# Patient Record
Sex: Male | Born: 1945
Health system: Southern US, Community
[De-identification: ages and names within clinical notes are randomized; demographics above are authoritative.]

## PROBLEM LIST (undated history)

## (undated) DIAGNOSIS — E119 Type 2 diabetes mellitus without complications: Secondary | ICD-10-CM

## (undated) DIAGNOSIS — R0600 Dyspnea, unspecified: Secondary | ICD-10-CM

## (undated) DIAGNOSIS — R251 Tremor, unspecified: Secondary | ICD-10-CM

## (undated) DIAGNOSIS — Z87898 Personal history of other specified conditions: Secondary | ICD-10-CM

## (undated) DIAGNOSIS — I890 Lymphedema, not elsewhere classified: Secondary | ICD-10-CM

## (undated) DIAGNOSIS — R062 Wheezing: Secondary | ICD-10-CM

## (undated) DIAGNOSIS — I350 Nonrheumatic aortic (valve) stenosis: Secondary | ICD-10-CM

## (undated) DIAGNOSIS — Z720 Tobacco use: Secondary | ICD-10-CM

## (undated) DIAGNOSIS — J449 Chronic obstructive pulmonary disease, unspecified: Secondary | ICD-10-CM

## (undated) DIAGNOSIS — F32A Depression, unspecified: Secondary | ICD-10-CM

## (undated) DIAGNOSIS — I5030 Unspecified diastolic (congestive) heart failure: Secondary | ICD-10-CM

## (undated) DIAGNOSIS — F329 Major depressive disorder, single episode, unspecified: Secondary | ICD-10-CM

## (undated) DIAGNOSIS — R011 Cardiac murmur, unspecified: Secondary | ICD-10-CM

## (undated) DIAGNOSIS — M1990 Unspecified osteoarthritis, unspecified site: Secondary | ICD-10-CM

## (undated) DIAGNOSIS — C61 Malignant neoplasm of prostate: Secondary | ICD-10-CM

## (undated) DIAGNOSIS — G4730 Sleep apnea, unspecified: Secondary | ICD-10-CM

## (undated) DIAGNOSIS — I10 Essential (primary) hypertension: Secondary | ICD-10-CM

## (undated) DIAGNOSIS — R079 Chest pain, unspecified: Secondary | ICD-10-CM

## (undated) HISTORY — DX: Unspecified diastolic (congestive) heart failure: I50.30

## (undated) HISTORY — DX: Major depressive disorder, single episode, unspecified: F32.9

## (undated) HISTORY — DX: Malignant neoplasm of prostate: C61

## (undated) HISTORY — DX: Morbid (severe) obesity due to excess calories: E66.01

## (undated) HISTORY — DX: Tobacco use: Z72.0

## (undated) HISTORY — PX: CARDIAC CATHETERIZATION: SHX172

## (undated) HISTORY — PX: APPENDECTOMY: SHX54

## (undated) HISTORY — PX: OTHER SURGICAL HISTORY: SHX169

## (undated) HISTORY — DX: Nonrheumatic aortic (valve) stenosis: I35.0

## (undated) HISTORY — PX: ADENOIDECTOMY: SUR15

## (undated) HISTORY — DX: Chest pain, unspecified: R07.9

## (undated) HISTORY — PX: TONSILLECTOMY: SUR1361

## (undated) HISTORY — DX: Depression, unspecified: F32.A

## (undated) HISTORY — DX: Sleep apnea, unspecified: G47.30

## (undated) HISTORY — DX: Type 2 diabetes mellitus without complications: E11.9

---

## 2013-03-04 DIAGNOSIS — Z8546 Personal history of malignant neoplasm of prostate: Secondary | ICD-10-CM | POA: Insufficient documentation

## 2013-03-04 DIAGNOSIS — C61 Malignant neoplasm of prostate: Secondary | ICD-10-CM

## 2013-03-04 HISTORY — DX: Malignant neoplasm of prostate: C61

## 2016-03-09 DIAGNOSIS — Z5900 Homelessness unspecified: Secondary | ICD-10-CM

## 2016-03-11 NOTE — Congregational Nurse Program (Unsigned)
Congregational Nurse Program Note  Date of Encounter: 03/09/2016  Past Medical History: No past medical history on file.  Encounter Details:     CNP Questionnaire - 03/09/16 1631    Patient Demographics   Is this a new or existing patient? New   Patient is considered a/an Not Applicable   Race Other   Patient Assistance   Location of Patient Assistance Not Applicable   Patient's financial/insurance status Low Income;Medicare   Uninsured Patient No   Patient referred to apply for the following financial assistance Not Applicable   Food insecurities addressed Provided food supplies   Transportation assistance No   Assistance securing medications No   Educational health offerings Hypertension   Encounter Details   Primary purpose of visit Chronic Illness/Condition Visit   Was an Emergency Department visit averted? Not Applicable   Does patient have a medical provider? Yes   Patient referred to Not Applicable   Was a mental health screening completed? (GAINS tool) No   Does patient have dental issues? No   Does patient have vision issues? No   Does your patient have an abnormal blood pressure today? Yes   Since previous encounter, have you referred patient for abnormal blood pressure that resulted in a new diagnosis or medication change? No   Does your patient have an abnormal blood glucose today? No   Since previous encounter, have you referred patient for abnormal blood glucose that resulted in a new diagnosis or medication change? No   Was there a life-saving intervention made? No      Requested B/P check.  150/90.  States does smoke and just finished smoking a cigarette .  Discussed with him a plan for decreasing smoking

## 2016-03-16 DIAGNOSIS — Z5900 Homelessness unspecified: Secondary | ICD-10-CM

## 2016-03-16 DIAGNOSIS — Z8679 Personal history of other diseases of the circulatory system: Secondary | ICD-10-CM

## 2016-03-16 NOTE — Congregational Nurse Program (Unsigned)
Client presented to [facility] asking if CN would take his blood pressure, client had blood pressure taken a week ago and wanted to get his blood pressure again and it was 160/80 on 03/15/16.  Client did not complain of chest pain or shortness of breath or headache.  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  Client is in [place] from [place].  Client denies feelings of depression, affect is congruent with mood.

## 2016-03-23 DIAGNOSIS — Z8679 Personal history of other diseases of the circulatory system: Secondary | ICD-10-CM

## 2016-03-23 DIAGNOSIS — Z5900 Homelessness unspecified: Secondary | ICD-10-CM

## 2016-03-26 DIAGNOSIS — Z5900 Homelessness unspecified: Secondary | ICD-10-CM

## 2016-03-26 DIAGNOSIS — Z8679 Personal history of other diseases of the circulatory system: Secondary | ICD-10-CM

## 2016-03-30 DIAGNOSIS — Z5900 Homelessness unspecified: Secondary | ICD-10-CM

## 2016-03-30 DIAGNOSIS — Z8679 Personal history of other diseases of the circulatory system: Secondary | ICD-10-CM

## 2016-04-01 DIAGNOSIS — Z8679 Personal history of other diseases of the circulatory system: Secondary | ICD-10-CM

## 2016-04-01 DIAGNOSIS — Z5900 Homelessness unspecified: Secondary | ICD-10-CM

## 2016-04-01 NOTE — Congregational Nurse Program (Unsigned)
Congregational Nurse Program Note  Date of Encounter: 04/01/2016  Past Medical History: No past medical history on file.  Encounter Details:     CNP Questionnaire - 04/01/16 1012    Patient Demographics   Is this a new or existing patient? Existing   Patient is considered a/an Not Applicable   Race Caucasian/White   Patient Assistance   Location of Patient Assistance Not Applicable   Patient's financial/insurance status Low Income;Medicare   Uninsured Patient No   Patient referred to apply for the following financial assistance Not Applicable   Food insecurities addressed Provided food supplies   Transportation assistance No   Assistance securing medications No   Educational health offerings Hypertension;Health literacy;Navigating the healthcare system;Safety;Chronic disease   Encounter Details   Primary purpose of visit Chronic Illness/Condition Visit;Education/Health Concerns   Was an Emergency Department visit averted? Not Applicable   Does patient have a medical provider? No   Patient referred to Clinic   Was a mental health screening completed? (GAINS tool) No   Does patient have dental issues? No   Does patient have vision issues? No   Does your patient have an abnormal blood pressure today? Yes   Since previous encounter, have you referred patient for abnormal blood pressure that resulted in a new diagnosis or medication change? No   Does your patient have an abnormal blood glucose today? No   Since previous encounter, have you referred patient for abnormal blood glucose that resulted in a new diagnosis or medication change? No   Was there a life-saving intervention made? No       B/P check.  Edema of lower legs and feet slightly less from last visit.  Lung sounds - "crackles" all lobes with noticeable rhonchi mid left lobe.  Given his appointment with a HCP is not until July 7, discussed with him when and if he needed to be seen in the ED.  S/S of increased cough,  generalized not feeling well or increased fatigue or change in color of his sputum to yellow

## 2016-04-01 NOTE — Congregational Nurse Program (Unsigned)
Congregational Nurse Program Note  Date of Encounter: 03/23/2016  Past Medical History: No past medical history on file.  Encounter Details:     CNP Questionnaire - 03/23/16 1001    Patient Demographics   Is this a new or existing patient? Existing   Patient is considered a/an Not Applicable   Race Caucasian/White   Patient Assistance   Location of Patient Assistance Not Applicable   Patient's financial/insurance status Low Income;Medicare   Uninsured Patient No   Patient referred to apply for the following financial assistance Not Applicable   Food insecurities addressed Provided food supplies   Transportation assistance No   Assistance securing medications No   Educational health offerings Hypertension;Health literacy;Navigating the healthcare system;Safety;Chronic disease   Encounter Details   Primary purpose of visit Chronic Illness/Condition Visit;Education/Health Concerns   Was an Emergency Department visit averted? Yes   Does patient have a medical provider? No   Patient referred to Clinic   Was a mental health screening completed? (GAINS tool) No   Does patient have dental issues? No   Does patient have vision issues? No   Does your patient have an abnormal blood pressure today? Yes   Since previous encounter, have you referred patient for abnormal blood pressure that resulted in a new diagnosis or medication change? No   Does your patient have an abnormal blood glucose today? No   Since previous encounter, have you referred patient for abnormal blood glucose that resulted in a new diagnosis or medication change? No   Was there a life-saving intervention made? No       Clinic visit for B/P check.  Also stated at night he is having difficulty breathing while laying in bed.  Client has a large girth.  Suggested he add a pillow to elevate his head while sleeping

## 2016-04-01 NOTE — Congregational Nurse Program (Unsigned)
Congregational Nurse Program Note  Date of Encounter: 03/26/2016  Past Medical History: No past medical history on file.  Encounter Details:     CNP Questionnaire - 03/25/16 1005    Patient Demographics   Is this a new or existing patient? Existing   Patient is considered a/an Not Applicable   Race Caucasian/White   Patient Assistance   Location of Patient Assistance Not Applicable   Patient's financial/insurance status Low Income;Medicare   Uninsured Patient No   Patient referred to apply for the following financial assistance Not Applicable   Food insecurities addressed Provided food supplies   Transportation assistance No   Assistance securing medications No   Educational health offerings Hypertension;Health literacy;Navigating the healthcare system;Safety;Chronic disease   Encounter Details   Primary purpose of visit Chronic Illness/Condition Visit;Education/Health Concerns   Was an Emergency Department visit averted? Yes   Does patient have a medical provider? No   Patient referred to Clinic   Was a mental health screening completed? (GAINS tool) No   Does patient have dental issues? No   Does patient have vision issues? No   Does your patient have an abnormal blood pressure today? Yes   Since previous encounter, have you referred patient for abnormal blood pressure that resulted in a new diagnosis or medication change? No   Does your patient have an abnormal blood glucose today? No   Since previous encounter, have you referred patient for abnormal blood glucose that resulted in a new diagnosis or medication change? No   Was there a life-saving intervention made? No       B/P check.  States he was able to obtain a second pillow which helped his breathing at night.  Does have an appointment with [facility] on July 7.  Encouraged him to return to clinic to have B/P monitored

## 2016-04-01 NOTE — Congregational Nurse Program (Unsigned)
Congregational Nurse Program Note  Date of Encounter: 03/30/2016  Past Medical History: No past medical history on file.  Encounter Details:     CNP Questionnaire - 03/30/16 1008    Patient Demographics   Is this a new or existing patient? Existing   Patient is considered a/an Not Applicable   Race Caucasian/White   Patient Assistance   Location of Patient Assistance Not Applicable   Patient's financial/insurance status Low Income;Medicare   Uninsured Patient No   Patient referred to apply for the following financial assistance Not Applicable   Food insecurities addressed Provided food supplies   Transportation assistance No   Assistance securing medications No   Educational health offerings Hypertension;Health literacy;Navigating the healthcare system;Safety;Chronic disease   Encounter Details   Primary purpose of visit Chronic Illness/Condition Visit;Education/Health Concerns   Was an Emergency Department visit averted? Yes   Does patient have a medical provider? No   Patient referred to Clinic   Was a mental health screening completed? (GAINS tool) No   Does patient have dental issues? No   Does patient have vision issues? No   Does your patient have an abnormal blood pressure today? Yes   Since previous encounter, have you referred patient for abnormal blood pressure that resulted in a new diagnosis or medication change? No   Does your patient have an abnormal blood glucose today? No   Since previous encounter, have you referred patient for abnormal blood glucose that resulted in a new diagnosis or medication change? No   Was there a life-saving intervention made? No       B/P check.  States is coughing up mucous, clear to white in color.  Non-pitting edema noted both feet.  Encouraged elevation of feet as much as possible.  States by history has COPD.  Is a smoker.  Discussed with him the need to begin stopping smoking

## 2016-04-13 DIAGNOSIS — Z5900 Homelessness unspecified: Secondary | ICD-10-CM

## 2016-04-13 DIAGNOSIS — Z8679 Personal history of other diseases of the circulatory system: Secondary | ICD-10-CM

## 2016-04-15 DIAGNOSIS — Z8679 Personal history of other diseases of the circulatory system: Secondary | ICD-10-CM

## 2016-04-22 DIAGNOSIS — Z8679 Personal history of other diseases of the circulatory system: Secondary | ICD-10-CM

## 2016-04-25 NOTE — Congregational Nurse Program (Unsigned)
Congregational Nurse Program Note  Date of Encounter: 04/13/2016  Past Medical History: No past medical history on file.  Encounter Details:     CNP Questionnaire - 04/14/16 1350      Patient Assistance   Uninsured Patient Yes   Food insecurities addressed Referred to food bank or resource   Transportation assistance No     Encounter Details   Was an Emergency Department visit averted? Yes   Does patient have a medical provider? Yes   Patient referred to Clinic   Does patient have dental issues? No   Does patient have vision issues? Yes   Was a vision referral made? Yes   Does your patient have an abnormal blood pressure today? No   Since previous encounter, have you referred patient for abnormal blood pressure that resulted in a new diagnosis or medication change? No   Does your patient have an abnormal blood glucose today? No   Since previous encounter, have you referred patient for abnormal blood glucose that resulted in a new diagnosis or medication change? No   Was there a life-saving intervention made? No      B/p Check.  Was seen at [facility].  Losartan increased to 50 mg daily.

## 2016-04-25 NOTE — Congregational Nurse Program (Unsigned)
Congregational Nurse Program Note  Date of Encounter: 04/15/2016  Past Medical History: No past medical history on file.  Encounter Details:     CNP Questionnaire - 04/15/16 1404      Patient Demographics   Is this a new or existing patient? Existing   Patient is considered a/an Not Applicable   Race Caucasian/White     Patient Assistance   Location of Patient Assistance Not Applicable   Patient's financial/insurance status Low Income;Medicare   Uninsured Patient No   Interventions Not Applicable   Patient referred to apply for the following financial assistance Not Applicable   Food insecurities addressed Referred to food bank or resource   Transportation assistance No   Assistance securing medications No   Educational health offerings Hypertension;Health literacy;Navigating the healthcare system;Safety;Chronic disease     Encounter Details   Primary purpose of visit Chronic Illness/Condition Visit;Education/Health Concerns   Was an Emergency Department visit averted? Yes   Does patient have a medical provider? Yes   Patient referred to Clinic   Was a mental health screening completed? (GAINS tool) No   Does patient have dental issues? No   Does patient have vision issues? Yes   Was a vision referral made? No   Does your patient have an abnormal blood pressure today? No   Since previous encounter, have you referred patient for abnormal blood pressure that resulted in a new diagnosis or medication change? No   Does your patient have an abnormal blood glucose today? No   Since previous encounter, have you referred patient for abnormal blood glucose that resulted in a new diagnosis or medication change? No   Was there a life-saving intervention made? No      B/p check 140/90.  Client had just come in from being outside.  Encouraged him to sit quietly and allow self to cool down.  Drinking water.

## 2016-04-25 NOTE — Congregational Nurse Program (Unsigned)
Congregational Nurse Program Note  Date of Encounter: 04/22/2016  Past Medical History: No past medical history on file.  Encounter Details:     CNP Questionnaire - 04/22/16 1407      Patient Demographics   Is this a new or existing patient? Existing   Patient is considered a/an Not Applicable   Race Caucasian/White     Patient Assistance   Location of Patient Assistance Not Applicable   Patient's financial/insurance status Low Income;Medicare   Uninsured Patient No   Interventions Not Applicable   Patient referred to apply for the following financial assistance Not Applicable   Food insecurities addressed Referred to food bank or resource   Transportation assistance No   Assistance securing medications No   Educational health offerings Hypertension;Health literacy;Navigating the healthcare system;Safety;Chronic disease     Encounter Details   Primary purpose of visit Chronic Illness/Condition Visit;Education/Health Concerns   Was an Emergency Department visit averted? Yes   Does patient have a medical provider? Yes   Patient referred to Clinic   Was a mental health screening completed? (GAINS tool) No   Does patient have dental issues? No   Does patient have vision issues? Yes   Was a vision referral made? No   Does your patient have an abnormal blood pressure today? No   Since previous encounter, have you referred patient for abnormal blood pressure that resulted in a new diagnosis or medication change? No   Does your patient have an abnormal blood glucose today? No   Since previous encounter, have you referred patient for abnormal blood glucose that resulted in a new diagnosis or medication change? No   Was there a life-saving intervention made? No      B/P check  120/70.  Reports will be going to live with his daughter in the [place] area the first of August.

## 2016-05-23 ENCOUNTER — Emergency Department: Payer: Medicare Other

## 2016-05-23 ENCOUNTER — Emergency Department
Admission: EM | Admit: 2016-05-23 | Discharge: 2016-05-23 | Disposition: A | Discharge status: Home / Self Care | Payer: Medicare Other | Attending: Emergency Medicine | Admitting: Emergency Medicine

## 2016-05-23 ENCOUNTER — Encounter: Payer: Self-pay | Admitting: Emergency Medicine

## 2016-05-23 DIAGNOSIS — J449 Chronic obstructive pulmonary disease, unspecified: Secondary | ICD-10-CM | POA: Diagnosis not present

## 2016-05-23 DIAGNOSIS — L089 Local infection of the skin and subcutaneous tissue, unspecified: Secondary | ICD-10-CM

## 2016-05-23 DIAGNOSIS — Z7951 Long term (current) use of inhaled steroids: Secondary | ICD-10-CM | POA: Insufficient documentation

## 2016-05-23 DIAGNOSIS — T814XXA Infection following a procedure, initial encounter: Secondary | ICD-10-CM | POA: Insufficient documentation

## 2016-05-23 DIAGNOSIS — Z7982 Long term (current) use of aspirin: Secondary | ICD-10-CM | POA: Insufficient documentation

## 2016-05-23 DIAGNOSIS — T148XXA Other injury of unspecified body region, initial encounter: Secondary | ICD-10-CM

## 2016-05-23 DIAGNOSIS — Z87891 Personal history of nicotine dependence: Secondary | ICD-10-CM | POA: Diagnosis not present

## 2016-05-23 DIAGNOSIS — I10 Essential (primary) hypertension: Secondary | ICD-10-CM | POA: Insufficient documentation

## 2016-05-23 DIAGNOSIS — Y829 Unspecified medical devices associated with adverse incidents: Secondary | ICD-10-CM | POA: Insufficient documentation

## 2016-05-23 HISTORY — DX: Essential (primary) hypertension: I10

## 2016-05-23 HISTORY — DX: Chronic obstructive pulmonary disease, unspecified: J44.9

## 2016-05-23 LAB — CBC WITH DIFFERENTIAL/PLATELET
BASOS ABS: 0.1 10*3/uL (ref 0–0.1)
BASOS PCT: 1 %
EOS ABS: 0.2 10*3/uL (ref 0–0.7)
EOS PCT: 2 %
HCT: 46.9 % (ref 40.0–52.0)
Hemoglobin: 16.4 g/dL (ref 13.0–18.0)
Lymphocytes Relative: 18 %
Lymphs Abs: 1.5 10*3/uL (ref 1.0–3.6)
MCH: 33.3 pg (ref 26.0–34.0)
MCHC: 35.1 g/dL (ref 32.0–36.0)
MCV: 94.9 fL (ref 80.0–100.0)
MONO ABS: 0.8 10*3/uL (ref 0.2–1.0)
Monocytes Relative: 10 %
Neutro Abs: 5.9 10*3/uL (ref 1.4–6.5)
Neutrophils Relative %: 69 %
PLATELETS: 210 10*3/uL (ref 150–440)
RBC: 4.94 MIL/uL (ref 4.40–5.90)
RDW: 13.5 % (ref 11.5–14.5)
WBC: 8.4 10*3/uL (ref 3.8–10.6)

## 2016-05-23 LAB — COMPREHENSIVE METABOLIC PANEL
ALBUMIN: 4.1 g/dL (ref 3.5–5.0)
ALT: 33 U/L (ref 17–63)
AST: 29 U/L (ref 15–41)
Alkaline Phosphatase: 81 U/L (ref 38–126)
Anion gap: 7 (ref 5–15)
BUN: 23 mg/dL — AB (ref 6–20)
CHLORIDE: 104 mmol/L (ref 101–111)
CO2: 27 mmol/L (ref 22–32)
Calcium: 9.5 mg/dL (ref 8.9–10.3)
Creatinine, Ser: 1.02 mg/dL (ref 0.61–1.24)
GFR calc Af Amer: >60 mL/min (ref >60)
GLUCOSE: 103 mg/dL — AB (ref 65–99)
POTASSIUM: 4 mmol/L (ref 3.5–5.1)
Sodium: 138 mmol/L (ref 135–145)
Total Bilirubin: 0.9 mg/dL (ref 0.3–1.2)
Total Protein: 7.2 g/dL (ref 6.5–8.1)

## 2016-05-23 MED ORDER — CEFAZOLIN IN D5W 1 GM/50ML IV SOLN
INTRAVENOUS | Status: AC
  Filled 2016-05-23: qty 50

## 2016-05-23 MED ORDER — CEPHALEXIN 500 MG PO CAPS: 500.0000 mg | ORAL_CAPSULE | Freq: Two times a day (BID) | ORAL | 0 refills | Status: DC

## 2016-05-23 MED ORDER — SULFAMETHOXAZOLE-TRIMETHOPRIM 800-160 MG PO TABS: 1.0000 | ORAL_TABLET | Freq: Two times a day (BID) | ORAL | 0 refills | Status: DC

## 2016-05-23 MED ORDER — CEFAZOLIN IN D5W 1 GM/50ML IV SOLN
1.0000 g | Freq: Once | INTRAVENOUS | Status: AC
  Administered 2016-05-23: 1 g via INTRAVENOUS

## 2016-05-23 NOTE — ED Provider Notes (Signed)
[facility] Emergency Department Provider Note   ____________________________________________    I have reviewed the triage vital signs and the nursing notes.   HISTORY  Chief Complaint Laceration     HPI Nathaniel Little is a 70 y.o. male who presents with complaints of possible wound infection. Patient reports he stepped on a piece of plywood one week ago and his leg went through it and he suffered a laceration to his anterior tibia area of the left leg. He anticipated that it would heal well but recently he has developed redness around the wound and increased pain. He denies fevers or chills. He denies discharge. He denies a history of diabetes.   Past Medical History:  Diagnosis Date  . COPD (chronic obstructive pulmonary disease) ([place])   . Hypertension     There are no active problems to display for this patient.   History reviewed. No pertinent surgical history.  Prior to Admission medications   Medication Sig Start Date End Date Taking? Authorizing Provider  albuterol (PROVENTIL) (2.5 MG/3ML) 0.083% nebulizer solution Take 2.5 mg by nebulization every 6 (six) hours as needed for wheezing or shortness of breath.    Historical Provider, MD  aspirin 81 MG tablet Take 81 mg by mouth daily.    Historical Provider, MD  cephALEXin (KEFLEX) 500 MG capsule Take 1 capsule (500 mg total) by mouth 2 (two) times daily. 05/23/16   Lavonia Drafts, MD  escitalopram (LEXAPRO) 10 MG tablet Take 10 mg by mouth daily. Reported on 04/14/2016    Historical Provider, MD  losartan (COZAAR) 50 MG tablet Take 50 mg by mouth daily.    Historical Provider, MD  sulfamethoxazole-trimethoprim (BACTRIM DS,SEPTRA DS) 800-160 MG tablet Take 1 tablet by mouth 2 (two) times daily. 05/23/16   Lavonia Drafts, MD     Allergies Codeine  History reviewed. No pertinent family history.  Social History Social History  Substance Use Topics  . Smoking status: Former Smoker    Years:  68.00    Types: Cigarettes  . Smokeless tobacco: Former Systems developer    Quit date: 05/16/2016  . Alcohol use Not on file    Review of Systems  Constitutional: No fever/chills   Cardiovascular: Denies chest pain. Respiratory: Denies shortness of breath. Gastrointestinal: No abdominal pain. .   Genitourinary: Negative for dysuria. Musculoskeletal: Leg pain as above Skin: As above Neurological: Negative for weakness  10-point ROS otherwise negative.  ____________________________________________   PHYSICAL EXAM:  VITAL SIGNS: ED Triage Vitals  Enc Vitals Group     BP 05/23/16 1703 140/78     Pulse Rate 05/23/16 1703 80     Resp 05/23/16 1703 16     Temp 05/23/16 1703 98.3 F (36.8 C)     Temp Source 05/23/16 1703 Oral     SpO2 05/23/16 1703 94 %     Weight 05/23/16 1703 225 lb (102.1 kg)     Height 05/23/16 1703 '5\' 8"'$  (1.727 m)     Head Circumference --      Peak Flow --      Pain Score 05/23/16 1706 5     Pain Loc --      Pain Edu? --      Excl. in [place]? --     Constitutional: Alert and oriented. No acute distress. Pleasant and interactive Eyes: Conjunctivae are normal.  Head: Atraumatic. Nose: No congestion/rhinnorhea.  Neck:  Painless ROM Cardiovascular: Normal rate, regular rhythm. Grossly normal heart sounds.  Good peripheral  circulation. Respiratory: Normal respiratory effort.  No retractions.    Musculoskeletal: Left lower leg: Approximately 8 cm vertical laceration that is foul smelling but without discharge or fluctuance. Mild erythema extending away from the wound. Neurologic:  Normal speech and language. No gross focal neurologic deficits are appreciated.  Skin:  Skin is warm, dry. As above Psychiatric: Mood and affect are normal. Speech and behavior are normal.  ____________________________________________   LABS (all labs ordered are listed, but only abnormal results are displayed)  Labs Reviewed  COMPREHENSIVE METABOLIC PANEL - Abnormal; Notable for  the following:       Result Value   Glucose, Bld 103 (*)    BUN 23 (*)    All other components within normal limits  CBC WITH DIFFERENTIAL/PLATELET   ____________________________________________  EKG  None ____________________________________________  RADIOLOGY  X-rays unremarkable ____________________________________________   PROCEDURES  Procedure(s) performed: No    Critical Care performed: No ____________________________________________   INITIAL IMPRESSION / ASSESSMENT AND PLAN / ED COURSE  Pertinent labs & imaging results that were available during my care of the patient were reviewed by me and considered in my medical decision making (see chart for details).  Patient presents with likely wound infection. We will check labs, check x-ray, give a dose of IV antibiotics and determine whether the patient requires inpatient or outpatient management. Tetanus is up-to-date  Clinical Course  Value Comment By Time  CO2: 27 (Reviewed) Lavonia Drafts, MD 08/20 1900  Discussed with patient he would prefer to try outpatient Rx, given that labs are unremarkable patient is afebrile and well-appearing I feel that this is reasonable. We will give a dose of IV antibiotics in the emergency department and the patient will return in 2 days for a wound recheck, he knows to return sooner if any worsening ____________________________________________   FINAL CLINICAL IMPRESSION(S) / ED DIAGNOSES  Final diagnoses:  Wound infection ([place])      NEW MEDICATIONS STARTED DURING THIS VISIT:  Discharge Medication List as of 05/23/2016  7:51 PM    START taking these medications   Details  cephALEXin (KEFLEX) 500 MG capsule Take 1 capsule (500 mg total) by mouth 2 (two) times daily., Starting Sun 05/23/2016, Print    sulfamethoxazole-trimethoprim (BACTRIM DS,SEPTRA DS) 800-160 MG tablet Take 1 tablet by mouth 2 (two) times daily., Starting Sun 05/23/2016, Print         Note:  This  document was prepared using Dragon voice recognition software and may include unintentional dictation errors.    Lavonia Drafts, MD 05/23/16 2150

## 2016-05-23 NOTE — ED Triage Notes (Signed)
Pt states his left leg went through some plywood - has been treating it but redness and swelling have started.

## 2016-05-26 ENCOUNTER — Emergency Department
Admission: EM | Admit: 2016-05-26 | Discharge: 2016-05-26 | Disposition: A | Discharge status: Home / Self Care | Payer: Medicare Other | Attending: Emergency Medicine | Admitting: Emergency Medicine

## 2016-05-26 ENCOUNTER — Encounter: Payer: Self-pay | Admitting: Medical Oncology

## 2016-05-26 DIAGNOSIS — T148XXA Other injury of unspecified body region, initial encounter: Secondary | ICD-10-CM

## 2016-05-26 DIAGNOSIS — J449 Chronic obstructive pulmonary disease, unspecified: Secondary | ICD-10-CM | POA: Diagnosis not present

## 2016-05-26 DIAGNOSIS — Z87891 Personal history of nicotine dependence: Secondary | ICD-10-CM | POA: Insufficient documentation

## 2016-05-26 DIAGNOSIS — L089 Local infection of the skin and subcutaneous tissue, unspecified: Secondary | ICD-10-CM | POA: Diagnosis not present

## 2016-05-26 DIAGNOSIS — Z7982 Long term (current) use of aspirin: Secondary | ICD-10-CM | POA: Insufficient documentation

## 2016-05-26 DIAGNOSIS — I10 Essential (primary) hypertension: Secondary | ICD-10-CM | POA: Insufficient documentation

## 2016-05-26 DIAGNOSIS — Z5189 Encounter for other specified aftercare: Secondary | ICD-10-CM

## 2016-05-26 DIAGNOSIS — Z4800 Encounter for change or removal of nonsurgical wound dressing: Secondary | ICD-10-CM | POA: Diagnosis present

## 2016-05-26 LAB — CBC WITH DIFFERENTIAL/PLATELET
Basophils Absolute: 0.1 10*3/uL (ref 0–0.1)
Basophils Relative: 1 %
Eosinophils Absolute: 0.1 10*3/uL (ref 0–0.7)
Eosinophils Relative: 2 %
HEMATOCRIT: 47.3 % (ref 40.0–52.0)
HEMOGLOBIN: 16.6 g/dL (ref 13.0–18.0)
LYMPHS ABS: 1.6 10*3/uL (ref 1.0–3.6)
LYMPHS PCT: 18 %
MCH: 32.9 pg (ref 26.0–34.0)
MCHC: 35 g/dL (ref 32.0–36.0)
MCV: 93.9 fL (ref 80.0–100.0)
MONOS PCT: 10 %
Monocytes Absolute: 0.9 10*3/uL (ref 0.2–1.0)
NEUTROS ABS: 6.2 10*3/uL (ref 1.4–6.5)
NEUTROS PCT: 69 %
Platelets: 209 10*3/uL (ref 150–440)
RBC: 5.04 MIL/uL (ref 4.40–5.90)
RDW: 13 % (ref 11.5–14.5)
WBC: 9 10*3/uL (ref 3.8–10.6)

## 2016-05-26 LAB — BASIC METABOLIC PANEL
ANION GAP: 9 (ref 5–15)
BUN: 19 mg/dL (ref 6–20)
CHLORIDE: 102 mmol/L (ref 101–111)
CO2: 23 mmol/L (ref 22–32)
Calcium: 9.5 mg/dL (ref 8.9–10.3)
Creatinine, Ser: 1.02 mg/dL (ref 0.61–1.24)
GFR calc non Af Amer: >60 mL/min (ref >60)
Glucose, Bld: 89 mg/dL (ref 65–99)
Potassium: 4.1 mmol/L (ref 3.5–5.1)
Sodium: 134 mmol/L — ABNORMAL LOW (ref 135–145)

## 2016-05-26 NOTE — ED Triage Notes (Signed)
Pt was seen here 2 days ago, here for re-check.

## 2016-05-26 NOTE — Discharge Instructions (Signed)
Continue previous medication and discharge instructions. Advised to call the wound clinic in the morning to schedule appointment.

## 2016-05-26 NOTE — ED Provider Notes (Signed)
[facility] Emergency Department Provider Note   ____________________________________________   None    (approximate)  I have reviewed the triage vital signs and the nursing notes.   HISTORY  Chief Complaint Wound Check    HPI Nathaniel Little is a 70 y.o. male patient has a day to day follow-up wound check. Patient was seen 2 days ago after having a week old laceration left lower leg. Patient state he stepped on a piece of plywood his leg went through and he suffered lacerations to anterior left leg. Patient has significant medical care for 1 week. On patient visit 2 days ago he was given IV antibiotics and discharged with Bactrim and Keflex. Patient states redness has spread it from the initial wound site and now the wound has a foul smell to it. Initially the patient was offered to be admitted but wanted to try outpatient therapy. Patient states he believed the wound is has worsened and requests admission.Patient rates his pain as 8/10.   Past Medical History:  Diagnosis Date  . COPD (chronic obstructive pulmonary disease) ([place])   . Hypertension     There are no active problems to display for this patient.   History reviewed. No pertinent surgical history.  Prior to Admission medications   Medication Sig Start Date End Date Taking? Authorizing Provider  albuterol (PROVENTIL) (2.5 MG/3ML) 0.083% nebulizer solution Take 2.5 mg by nebulization every 6 (six) hours as needed for wheezing or shortness of breath.    Historical Provider, MD  aspirin 81 MG tablet Take 81 mg by mouth daily.    Historical Provider, MD  cephALEXin (KEFLEX) 500 MG capsule Take 1 capsule (500 mg total) by mouth 2 (two) times daily. 05/23/16   Lavonia Drafts, MD  escitalopram (LEXAPRO) 10 MG tablet Take 10 mg by mouth daily. Reported on 04/14/2016    Historical Provider, MD  losartan (COZAAR) 50 MG tablet Take 50 mg by mouth daily.    Historical Provider, MD    sulfamethoxazole-trimethoprim (BACTRIM DS,SEPTRA DS) 800-160 MG tablet Take 1 tablet by mouth 2 (two) times daily. 05/23/16   Lavonia Drafts, MD    Allergies Codeine  No family history on file.  Social History Social History  Substance Use Topics  . Smoking status: Former Smoker    Years: 68.00    Types: Cigarettes  . Smokeless tobacco: Former Systems developer    Quit date: 05/16/2016  . Alcohol use Not on file    Review of Systems Constitutional: No fever/chills Eyes: No visual changes. ENT: No sore throat. Cardiovascular: Denies chest pain. Respiratory: Denies shortness of breath. Gastrointestinal: No abdominal pain.  No nausea, no vomiting.  No diarrhea.  No constipation. Genitourinary: Negative for dysuria. Musculoskeletal: Negative for back pain. Skin: Negative for rash. Nonhealing laceration left leg. Neurological: Negative for headaches, focal weakness or numbness. Endocrine:Hypertension ____________________________________________   PHYSICAL EXAM:  VITAL SIGNS: ED Triage Vitals  Enc Vitals Group     BP 05/26/16 1717 (!) 163/85     Pulse Rate 05/26/16 1717 77     Resp 05/26/16 1717 20     Temp 05/26/16 1717 97.6 F (36.4 C)     Temp Source 05/26/16 1717 Oral     SpO2 05/26/16 1717 95 %     Weight 05/26/16 1717 225 lb (102.1 kg)     Height 05/26/16 1717 '5\' 8"'$  (1.727 m)     Head Circumference --      Peak Flow --  Pain Score 05/26/16 1719 8     Pain Loc --      Pain Edu? --      Excl. in [place]? --     Constitutional: Alert and oriented. Well appearing and in no acute distress. Eyes: Conjunctivae are normal. PERRL. EOMI. Head: Atraumatic. Nose: No congestion/rhinnorhea. Mouth/Throat: Mucous membranes are moist.  Oropharynx non-erythematous. Neck: No stridor.  No cervical spine tenderness to palpation.. Hematological/Lymphatic/Immunilogical: No cervical lymphadenopathy. Cardiovascular: Normal rate, regular rhythm. Grossly normal heart sounds.  Good peripheral  circulation. Elevated blood pressure Respiratory: Normal respiratory effort.  No retractions. Lungs CTAB. Gastrointestinal: Soft and nontender. No distention. No abdominal bruits. No CVA tenderness. Musculoskeletal: No lower extremity tenderness nor edema.  No joint effusions. Neurologic:  Normal speech and language. No gross focal neurologic deficits are appreciated. No gait instability. Skin:  Patient has a 7-8 cm vertical laceration to the anterior lower left leg. There is obvious foul smell upon entry into room. Patient has erythema extending from the wound site. Unable to chart progress erythematous from previous visit. Psychiatric: Mood and affect are normal. Speech and behavior are normal.  ____________________________________________   LABS (all labs ordered are listed, but only abnormal results are displayed)  Labs Reviewed  BASIC METABOLIC PANEL - Abnormal; Notable for the following:       Result Value   Sodium 134 (*)    All other components within normal limits  CBC WITH DIFFERENTIAL/PLATELET   ____________________________________________  EKG   ____________________________________________  RADIOLOGY   ____________________________________________   PROCEDURES  Procedure(s) performed: None  Procedures  Critical Care performed: No  ____________________________________________   INITIAL IMPRESSION / ASSESSMENT AND PLAN / ED COURSE  Pertinent labs & imaging results that were available during my care of the patient were reviewed by me and considered in my medical decision making (see chart for details).  Wound infection left lower leg. Discussed last with patient. Advised to continue previous antibiotics and follow-up with the wound clinic by calling for an appointment tomorrow morning.  Clinical Course   Patient complaining is not responding to outpatient antibiotics. Will discuss with Doctor  Karma Greaser.  ____________________________________________   FINAL CLINICAL IMPRESSION(S) / ED DIAGNOSES  Final diagnoses:  Wound infection ([place])  Visit for wound check      NEW MEDICATIONS STARTED DURING THIS VISIT:  New Prescriptions   No medications on file     Note:  This document was prepared using Dragon voice recognition software and may include unintentional dictation errors.    Sable Feil, PA-C 05/26/16 [place], PA-C 05/26/16 1912    Hinda Kehr, MD 05/26/16 1944

## 2016-05-31 ENCOUNTER — Encounter: Payer: Medicare Other | Attending: Surgery | Admitting: Surgery

## 2016-05-31 DIAGNOSIS — Z79899 Other long term (current) drug therapy: Secondary | ICD-10-CM | POA: Diagnosis not present

## 2016-05-31 DIAGNOSIS — I10 Essential (primary) hypertension: Secondary | ICD-10-CM | POA: Diagnosis not present

## 2016-05-31 DIAGNOSIS — Z8546 Personal history of malignant neoplasm of prostate: Secondary | ICD-10-CM | POA: Diagnosis not present

## 2016-05-31 DIAGNOSIS — X58XXXA Exposure to other specified factors, initial encounter: Secondary | ICD-10-CM | POA: Insufficient documentation

## 2016-05-31 DIAGNOSIS — L97222 Non-pressure chronic ulcer of left calf with fat layer exposed: Secondary | ICD-10-CM | POA: Insufficient documentation

## 2016-05-31 DIAGNOSIS — F329 Major depressive disorder, single episode, unspecified: Secondary | ICD-10-CM | POA: Diagnosis not present

## 2016-05-31 DIAGNOSIS — M1611 Unilateral primary osteoarthritis, right hip: Secondary | ICD-10-CM | POA: Diagnosis not present

## 2016-05-31 DIAGNOSIS — J449 Chronic obstructive pulmonary disease, unspecified: Secondary | ICD-10-CM | POA: Insufficient documentation

## 2016-05-31 DIAGNOSIS — Z87891 Personal history of nicotine dependence: Secondary | ICD-10-CM | POA: Insufficient documentation

## 2016-05-31 DIAGNOSIS — Z7982 Long term (current) use of aspirin: Secondary | ICD-10-CM | POA: Diagnosis not present

## 2016-05-31 DIAGNOSIS — I890 Lymphedema, not elsewhere classified: Secondary | ICD-10-CM | POA: Diagnosis not present

## 2016-05-31 DIAGNOSIS — S81812A Laceration without foreign body, left lower leg, initial encounter: Secondary | ICD-10-CM | POA: Diagnosis present

## 2016-05-31 NOTE — Progress Notes (Addendum)
RONDEL, EPISCOPO ([id number]) Visit Report for 05/31/2016 Chief Complaint Document Details Patient Name: Nathaniel Little, Nathaniel Little Date of Service: 05/31/2016 8:45 AM Medical Record Number: [id number] Patient Account Number: [id number] Date of Birth/Sex: 1946-08-09 (70 y.o. Male) Treating RN: Cornell Barman Primary Care Physician: PATIENT, NO Other Clinician: Referring Physician: Hinda Kehr Treating Physician/Extender: Frann Rider in Treatment: 0 Information Obtained from: Patient Chief Complaint Patient seen for complaints of Non-Healing Wound for his left lower extremity for 2 weeks Electronic Signature(s) Signed: 05/31/2016 9:24:39 AM By: Christin Fudge MD, FACS Entered By: Christin Fudge on 05/31/2016 09:24:39 [name] ([id number]) -------------------------------------------------------------------------------- Debridement Details Patient Name: [name] Date of Service: 05/31/2016 8:45 AM Medical Record Number: [id number] Patient Account Number: [id number] Date of Birth/Sex: [birth year] (69 y.o. Male) Treating RN: Cornell Barman Primary Care Physician: PATIENT, NO Other Clinician: Referring Physician: Hinda Kehr Treating Physician/Extender: Frann Rider in Treatment: 0 Debridement Performed for Wound #1 Left Lower Leg Assessment: Performed By: Physician Christin Fudge, MD Debridement: Debridement Pre-procedure Yes - 09:08 Verification/Time Out Taken: Start Time: 09:08 Pain Control: Other : lidocaine 4% Level: Skin/Subcutaneous Tissue Total Area Debrided (L x 6 (cm) x 1 (cm) = 6 (cm) W): Tissue and other Viable, Non-Viable, Eschar, Exudate, Fibrin/Slough, Subcutaneous material debrided: Bleeding: Minimum Hemostasis Achieved: Pressure End Time: 09:10 Procedural Pain: 3 Post Procedural Pain: 3 Response to Treatment: Procedure was tolerated well Post Debridement Measurements of Total Wound Length: (cm) 6 Width: (cm) 1 Depth: (cm) 0.3 Volume: (cm)  1.414 Character of Wound/Ulcer Post Improved Debridement: Severity of Tissue Post Debridement: Fat layer exposed Post Procedure Diagnosis Same as Pre-procedure Electronic Signature(s) Signed: 05/31/2016 9:24:13 AM By: Christin Fudge MD, FACS Signed: 05/31/2016 9:53:09 AM By: Gretta Cool RN, BSN, Kim RN, BSN Entered By: Christin Fudge on 05/31/2016 09:24:12 KSHAWN, CANAL ([id number]) -------------------------------------------------------------------------------- HPI Details Patient Name: [name] Date of Service: 05/31/2016 8:45 AM Medical Record Number: [id number] Patient Account Number: [id number] Date of Birth/Sex: 20-Jan-1946 (70 y.o. Male) Treating RN: Cornell Barman Primary Care Physician: PATIENT, NO Other Clinician: Referring Physician: Hinda Kehr Treating Physician/Extender: Frann Rider in Treatment: 0 History of Present Illness Location: left lower extremity Quality: Patient reports experiencing a sharp pain to affected area(s). Severity: Patient states wound are getting worse. Duration: Patient has had the wound for < 2 weeks prior to presenting for treatment Timing: Pain in wound is constant (hurts all the time) Context: The wound occurred when the patient a piece of plywood lacerated wound on his left lower extremity on the shin Modifying Factors: Consults to this date include:Keflex and Bactrim Associated Signs and Symptoms: Patient reports having increase swelling. HPI Description: 70 year old gentleman who presented to the ER a week ago with a history of having injury to his left leg with a piece of plywood which lacerated his leg. Past medical system history significant for COPD and hypertension.he is a former smoker who quit smoking recently. the ER he was found to have a vertical laceration on the left lower extremity which is foul-smelling and had no abscess or discharge.an x-ray was done which was unremarkable. report reviewed -- Mild soft tissue edema is  noted consistent with the given clinical history. No definitive radiopaque foreign body is seen. No bony abnormality is noted. He was started on Keflex 500 mg twice daily and Bactrim DS 1 tablet twice daily. He was seen back in the ER on 05/26/2016 and was found to have good progression of healing and no generalized cellulitis and did not warrant inpatient admission. Electronic Signature(s) Signed: 05/31/2016  9:25:50 AM By: Christin Fudge MD, FACS Previous Signature: 05/31/2016 8:21:24 AM Version By: Christin Fudge MD, FACS Previous Signature: 05/31/2016 8:16:01 AM Version By: Christin Fudge MD, FACS Previous Signature: 05/31/2016 8:14:17 AM Version By: Christin Fudge MD, FACS Entered By: Christin Fudge on 05/31/2016 09:25:50 IRAN, ROWE ([id number]) -------------------------------------------------------------------------------- Physical Exam Details Patient Name: [name] Date of Service: 05/31/2016 8:45 AM Medical Record Number: [id number] Patient Account Number: [id number] Date of Birth/Sex: [birth year] (69 y.o. Male) Treating RN: Cornell Barman Primary Care Physician: PATIENT, NO Other Clinician: Referring Physician: Hinda Kehr Treating Physician/Extender: Frann Rider in Treatment: 0 Constitutional . Pulse regular. Respirations normal and unlabored. Afebrile. . Eyes Nonicteric. Reactive to light. Ears, Nose, Mouth, and Throat Lips, teeth, and gums WNL.[name] Moist mucosa without lesions. Neck supple and nontender. No palpable supraclavicular or cervical adenopathy. Normal sized without goiter. Respiratory WNL. No retractions.. Cardiovascular Pedal Pulses WNL. both lower extremities have a ABI of 1.33 showing that they're noncompressible vessels. Stage 1 lymphedema both lower extremities. Chest Breasts symmetical and no nipple discharge.. Breast tissue WNL, no masses, lumps, or tenderness.. Gastrointestinal (GI) Abdomen without masses or tenderness.. No liver or spleen  enlargement or tenderness.. Lymphatic No adneopathy. No adenopathy. No adenopathy. Musculoskeletal Adexa without tenderness or enlargement.. Digits and nails w/o clubbing, cyanosis, infection, petechiae, ischemia, or inflammatory conditions.. Integumentary (Hair, Skin) No suspicious lesions. No crepitus or fluctuance. No peri-wound warmth or erythema. No masses.[name] Psychiatric Judgement and insight Intact.. No evidence of depression, anxiety, or agitation.. Notes gated wound on the left mid shin area with significant dry and wet debris at the base of the ulcerated area and I have sharply debrided this with a #3 curet and bleeding controlled with pressure. Electronic Signature(s) Signed: 05/31/2016 9:26:59 AM By: Christin Fudge MD, FACS Entered By: Christin Fudge on 05/31/2016 09:26:59 FADEL, CLASON ([id number]) BEXLEY, MCLESTER ([id number]) -------------------------------------------------------------------------------- Physician Orders Details Patient Name: [name] Date of Service: 05/31/2016 8:45 AM Medical Record Number: [id number] Patient Account Number: [id number] Date of Birth/Sex: Sep 22, 1946 (70 y.o. Male) Treating RN: Cornell Barman Primary Care Physician: PATIENT, NO Other Clinician: Referring Physician: Hinda Kehr Treating Physician/Extender: Frann Rider in Treatment: 0 Verbal / Phone Orders: Yes Clinician: Cornell Barman Read Back and Verified: Yes Diagnosis Coding Wound Cleansing Wound #1 Left Lower Leg o Clean wound with Normal Saline. o May Shower, gently pat wound dry prior to applying new dressing. Anesthetic o Topical Lidocaine 4% cream applied to wound bed prior to debridement Primary Wound Dressing Wound #1 Left Lower Leg o Medihoney gel - Manuka Honey over the counter Secondary Dressing Wound #1 Left Lower Leg o Boardered Foam Dressing - Volcano Dressing Change Frequency Wound #1 Left Lower Leg o Change dressing every day. Follow-up  Appointments Wound #1 Left Lower Leg o Other: - Friday Edema Control Wound #1 Left Lower Leg o Patient to wear own compression stockings o Elevate legs to the level of the heart and pump ankles as often as possible Medications-please add to medication list. Wound #1 Left Lower Leg o Other: - Medihoney Electronic Signature(s) JEVAUN, STRICK ([id number]) Signed: 05/31/2016 9:53:09 AM By: Gretta Cool RN, BSN, Kim RN, BSN Signed: 05/31/2016 3:52:04 PM By: Christin Fudge MD, FACS Entered By: Gretta Cool RN, BSN, Kim on 05/31/2016 09:14:35 [name] ([id number]) -------------------------------------------------------------------------------- Problem List Details Patient Name: [name] Date of Service: 05/31/2016 8:45 AM Medical Record Number: [id number] Patient Account Number: [id number] Date of Birth/Sex: 1945/11/25 (70 y.o. Male) Treating RN: Cornell Barman Primary Care Physician: PATIENT, NO Other Clinician: Referring  Physician: Hinda Kehr Treating Physician/Extender: Frann Rider in Treatment: 0 Active Problems ICD-10 Encounter Code Description Active Date Diagnosis S81.812A Laceration without foreign body, left lower leg, initial 05/31/2016 Yes encounter L97.222 Non-pressure chronic ulcer of left calf with fat layer 05/31/2016 Yes exposed I89.0 Lymphedema, not elsewhere classified 05/31/2016 Yes Z87.891 Personal history of nicotine dependence 05/31/2016 Yes E66.01 Morbid (severe) obesity due to excess calories 05/31/2016 Yes Inactive Problems Resolved Problems Electronic Signature(s) Signed: 05/31/2016 9:23:51 AM By: Christin Fudge MD, FACS Entered By: Christin Fudge on 05/31/2016 09:23:51 [name] ([id number]) -------------------------------------------------------------------------------- Progress Note Details Patient Name: [name] Date of Service: 05/31/2016 8:45 AM Medical Record Number: [id number] Patient Account Number: [id number] Date of Birth/Sex:  April 27, 1946 (70 y.o. Male) Treating RN: Cornell Barman Primary Care Physician: PATIENT, NO Other Clinician: Referring Physician: Hinda Kehr Treating Physician/Extender: Frann Rider in Treatment: 0 Subjective Chief Complaint Information obtained from Patient Patient seen for complaints of Non-Healing Wound for his left lower extremity for 2 weeks History of Present Illness (HPI) The following HPI elements were documented for the patient's wound: Location: left lower extremity Quality: Patient reports experiencing a sharp pain to affected area(s). Severity: Patient states wound are getting worse. Duration: Patient has had the wound for < 2 weeks prior to presenting for treatment Timing: Pain in wound is constant (hurts all the time) Context: The wound occurred when the patient a piece of plywood lacerated wound on his left lower extremity on the shin Modifying Factors: Consults to this date include:Keflex and Bactrim Associated Signs and Symptoms: Patient reports having increase swelling. 70 year old gentleman who presented to the ER a week ago with a history of having injury to his left leg with a piece of plywood which lacerated his leg. Past medical system history significant for COPD and hypertension.he is a former smoker who quit smoking recently. the ER he was found to have a vertical laceration on the left lower extremity which is foul-smelling and had no abscess or discharge.an x-ray was done which was unremarkable. report reviewed -- Mild soft tissue edema is noted consistent with the given clinical history. No definitive radiopaque foreign body is seen. No bony abnormality is noted. He was started on Keflex 500 mg twice daily and Bactrim DS 1 tablet twice daily. He was seen back in the ER on 05/26/2016 and was found to have good progression of healing and no generalized cellulitis and did not warrant inpatient admission. Wound History Patient presents with 1 open wound  that has been present for approximately 2 weeks. Patient has been treating wound in the following manner: wet to dry. Laboratory tests have not been performed in the last month. Patient reportedly has not tested positive for an antibiotic resistant organism. Patient reportedly has not tested positive for osteomyelitis. Patient reportedly has not had testing performed to evaluate circulation in the legs. Patient experiences the following problems associated with their wounds: swelling. Patient History Information obtained from Patient, Chart. JUSTICE, MILLIRON ([id number]) Allergies codeine Family History Cancer - Mother, Father, No family history of Diabetes, Heart Disease, Hypertension, Kidney Disease, Lung Disease, Seizures, Stroke, Thyroid Problems, Tuberculosis. Social History Former smoker - 3 weeks ago, Marital Status - Widowed, Alcohol Use - Never, Drug Use - No History, Caffeine Use - Daily. Medical History Eyes Patient has history of Cataracts Denies history of Glaucoma, Optic Neuritis Ear/Nose/Mouth/Throat Denies history of Chronic sinus problems/congestion, Middle ear problems Hematologic/Lymphatic Denies history of Anemia, Hemophilia, Human Immunodeficiency Virus, Lymphedema, Sickle Cell Disease Respiratory Patient has history of  Chronic Obstructive Pulmonary Disease (COPD) Denies history of Aspiration, Asthma, Pneumothorax, Sleep Apnea, Tuberculosis Cardiovascular Patient has history of Hypertension Denies history of Angina, Arrhythmia, Congestive Heart Failure, Coronary Artery Disease, Deep Vein Thrombosis, Hypotension, Myocardial Infarction, Peripheral Arterial Disease, Peripheral Venous Disease, Phlebitis, Vasculitis Gastrointestinal Denies history of Cirrhosis , Colitis, Crohn s, Hepatitis A, Hepatitis B, Hepatitis C Endocrine Denies history of Type I Diabetes, Type II Diabetes Genitourinary Denies history of End Stage Renal Disease Immunological Denies history of  Lupus Erythematosus, Raynaud s, Scleroderma Integumentary (Skin) Denies history of History of Burn, History of pressure wounds Musculoskeletal Patient has history of Osteoarthritis - right hip Denies history of Gout, Rheumatoid Arthritis, Osteomyelitis Neurologic Denies history of Dementia, Neuropathy, Quadriplegia, Paraplegia Oncologic Patient has history of Received Radiation - Prostate Cancer June 2014 Denies history of Received Chemotherapy Psychiatric Denies history of Anorexia/bulimia, Confinement Anxiety Medical And Surgical History Notes Kairen, Hallinan Noriel ([id number]) Constitutional Symptoms (General Health) High Blood Pressure; COPD, Depression, Cardiovascular Heart Murmor Review of Systems (ROS) Constitutional Symptoms (General Health) The patient has no complaints or symptoms. Eyes Complains or has symptoms of Glasses / Contacts - glasses. Ear/Nose/Mouth/Throat The patient has no complaints or symptoms. Respiratory Complains or has symptoms of Chronic or frequent coughs, Shortness of Breath. Cardiovascular Complains or has symptoms of Chest pain, LE edema. Gastrointestinal The patient has no complaints or symptoms. Endocrine The patient has no complaints or symptoms. Genitourinary The patient has no complaints or symptoms. Immunological The patient has no complaints or symptoms. Integumentary (Skin) Complains or has symptoms of Wounds. Denies complaints or symptoms of Bleeding or bruising tendency, Breakdown, Swelling. Musculoskeletal Denies complaints or symptoms of Muscle Pain, Muscle Weakness. Neurologic The patient has no complaints or symptoms. Psychiatric The patient has no complaints or symptoms. Medications sulfamethoxazole 800 mg-trimethoprim 160 mg tablet oral 1 1 tablet oral two times daily losartan 50 mg tablet oral 1 1 tablet oral daily albuterol sulfate 2.5 mg/3 mL (0.083 %) solution for nebulization inhalation 1 1 solution for  nebulization inhalation every six hours as needed cephalexin 500 mg capsule oral 1 1 capsule oral two times daily aspirin 81 mg tablet,delayed release oral 1 1 tablet,delayed release (DR/EC) oral escitalopram 10 mg tablet oral 1 1 tablet oral daily Objective Emig, Arik ([id number]) Constitutional Pulse regular. Respirations normal and unlabored. Afebrile. Vitals Time Taken: 8:41 AM, Height: 69 in, Weight: 260 lbs, BMI: 38.4, Temperature: 97.7 F, Pulse: 63 bpm, Respiratory Rate: 20 breaths/min, Blood Pressure: 142/108 mmHg. Eyes Nonicteric. Reactive to light. Ears, Nose, Mouth, and Throat Lips, teeth, and gums WNL.[name] Moist mucosa without lesions. Neck supple and nontender. No palpable supraclavicular or cervical adenopathy. Normal sized without goiter. Respiratory WNL. No retractions.. Cardiovascular Pedal Pulses WNL. both lower extremities have a ABI of 1.33 showing that they're noncompressible vessels. Stage 1 lymphedema both lower extremities. Chest Breasts symmetical and no nipple discharge.. Breast tissue WNL, no masses, lumps, or tenderness.. Gastrointestinal (GI) Abdomen without masses or tenderness.. No liver or spleen enlargement or tenderness.. Lymphatic No adneopathy. No adenopathy. No adenopathy. Musculoskeletal Adexa without tenderness or enlargement.. Digits and nails w/o clubbing, cyanosis, infection, petechiae, ischemia, or inflammatory conditions.[name] Psychiatric Judgement and insight Intact.. No evidence of depression, anxiety, or agitation.. General Notes: gated wound on the left mid shin area with significant dry and wet debris at the base of the ulcerated area and I have sharply debrided this with a #3 curet and bleeding controlled with pressure. Integumentary (Hair, Skin) No suspicious lesions. No crepitus or fluctuance. No peri-wound warmth  or erythema. No masses.. Wound #1 status is Open. Original cause of wound was Trauma. The wound is located on the Left  Lower Leg. The wound measures 6cm length x 1cm width x 0.3cm depth; 4.712cm^2 area and 1.414cm^3 volume. The wound is limited to skin breakdown. There is no tunneling or undermining noted. There is a medium Chagnon, Ethaniel ([id number]) amount of serous drainage noted. The wound margin is flat and intact. There is small (1-33%) granulation within the wound bed. There is a large (67-100%) amount of necrotic tissue within the wound bed including Eschar and Adherent Slough. The periwound skin appearance exhibited: Induration, Scarring. The periwound skin appearance did not exhibit: Callus, Crepitus, Excoriation, Fluctuance, Friable, Localized Edema, Rash, Dry/Scaly, Maceration, Moist, Atrophie Blanche, Cyanosis, Ecchymosis, Hemosiderin Staining, Mottled, Pallor, Rubor, Erythema. Periwound temperature was noted as No Abnormality. Assessment Active Problems ICD-10 S81.812A - Laceration without foreign body, left lower leg, initial encounter L97.222 - Non-pressure chronic ulcer of left calf with fat layer exposed I89.0 - Lymphedema, not elsewhere classified Z87.891 - Personal history of nicotine dependence E66.01 - Morbid (severe) obesity due to excess calories this 70 year old gentleman who is morbidly obese and has recently given up smoking has a significant ulceration on the left lower extremity which has been treated appropriately with antibiotics orally for the last 2 weeks. After reiew I have recommended: 1. wash the wound daily with soap and water. 2. use medihoney daily and cover with a bordered foam 3. elevation and exercise 4. Compression stockings of the 20-30 mm variety to be applied daily and details of this discussed with him 5. see me back on Friday before the holiday Procedures Wound #1 Wound #1 is a Trauma, Other located on the Left Lower Leg . There was a Skin/Subcutaneous Tissue Debridement ([id number]) debridement with total area of 6 sq cm performed by Christin Fudge, MD.  to remove Viable and Non-Viable tissue/material including Exudate, Fibrin/Slough, Eschar, and Subcutaneous after achieving pain control using Other (lidocaine 4%). A time out was conducted at 09:08, prior to the start of the procedure. A Minimum amount of bleeding was controlled with Pressure. The procedure was tolerated well with a pain level of 3 throughout and a pain level of 3 following the procedure. Post Debridement Measurements: 6cm length x 1cm width x 0.3cm depth; 1.414cm^3 volume. Character of Wound/Ulcer Post Debridement is improved. Severity of Tissue Post Debridement is: Fat layer Macmullen, Benard ([id number]) exposed. Post procedure Diagnosis Wound #1: Same as Pre-Procedure Plan Wound Cleansing: Wound #1 Left Lower Leg: Clean wound with Normal Saline. May Shower, gently pat wound dry prior to applying new dressing. Anesthetic: Topical Lidocaine 4% cream applied to wound bed prior to debridement Primary Wound Dressing: Wound #1 Left Lower Leg: Medihoney gel - Manuka Honey over the counter Secondary Dressing: Wound #1 Left Lower Leg: Arley Dressing Change Frequency: Wound #1 Left Lower Leg: Change dressing every day. Follow-up Appointments: Wound #1 Left Lower Leg: Other: - Friday Edema Control: Wound #1 Left Lower Leg: Patient to wear own compression stockings Elevate legs to the level of the heart and pump ankles as often as possible Medications-please add to medication list.: Wound #1 Left Lower Leg: Other: - Medihoney this 70 year old gentleman who is morbidly obese and has recently given up smoking has a significant ulceration on the left lower extremity which has been treated appropriately with antibiotics orally for the last 2 weeks. After reiew I have recommended: 1. wash the wound daily with soap and  water. 2. use medihoney daily and cover with a bordered foam Mwangi, Treylon ([id number]) 3. elevation and exercise 4.  Compression stockings of the 20-30 mm variety to be applied daily and details of this discussed with him 5. see me back on Friday before the holiday Electronic Signature(s) Signed: 05/31/2016 9:29:35 AM By: Christin Fudge MD, FACS Entered By: Christin Fudge on 05/31/2016 09:29:34 [name] ([id number]) -------------------------------------------------------------------------------- ROS/PFSH Details Patient Name: [name] Date of Service: 05/31/2016 8:45 AM Medical Record Number: [id number] Patient Account Number: [id number] Date of Birth/Sex: 1946-01-04 (70 y.o. Male) Treating RN: Cornell Barman Primary Care Physician: PATIENT, NO Other Clinician: Referring Physician: Hinda Kehr Treating Physician/Extender: Frann Rider in Treatment: 0 Information Obtained From Patient Chart Wound History Do you currently have one or more open woundso Yes How many open wounds do you currently haveo 1 Approximately how long have you had your woundso 2 weeks How have you been treating your wound(s) until nowo wet to dry Has your wound(s) ever healed and then re-openedo No Have you had any lab work done in the past montho No Have you tested positive for an antibiotic resistant organism (MRSA, VRE)o No Have you tested positive for osteomyelitis (bone infection)o No Have you had any tests for circulation on your legso No Have you had other problems associated with your woundso Swelling Eyes Complaints and Symptoms: Positive for: Glasses / Contacts - glasses Medical History: Positive for: Cataracts Negative for: Glaucoma; Optic Neuritis Respiratory Complaints and Symptoms: Positive for: Chronic or frequent coughs; Shortness of Breath Medical History: Positive for: Chronic Obstructive Pulmonary Disease (COPD) Negative for: Aspiration; Asthma; Pneumothorax; Sleep Apnea; Tuberculosis Cardiovascular Complaints and Symptoms: Positive for: Chest pain; LE edema Medical History: Positive  for: Hypertension Negative for: Angina; Arrhythmia; Congestive Heart Failure; Coronary Artery Disease; Deep Vein Thrombosis; Hypotension; Myocardial Infarction; Peripheral Arterial Disease; Peripheral Venous Disease; Phlebitis; Vasculitis Roig, Leanthony ([id number]) Past Medical History Notes: Heart Murmor Gastrointestinal Complaints and Symptoms: No Complaints or Symptoms Complaints and Symptoms: Negative for: Frequent diarrhea; Nausea; Vomiting Medical History: Negative for: Cirrhosis ; Colitis; Crohnos; Hepatitis A; Hepatitis B; Hepatitis C Endocrine Complaints and Symptoms: No Complaints or Symptoms Complaints and Symptoms: Negative for: Hepatitis; Thyroid disease; Polydypsia (Excessive Thirst) Medical History: Negative for: Type I Diabetes; Type II Diabetes Integumentary (Skin) Complaints and Symptoms: Positive for: Wounds Negative for: Bleeding or bruising tendency; Breakdown; Swelling Medical History: Negative for: History of Burn; History of pressure wounds Musculoskeletal Complaints and Symptoms: Negative for: Muscle Pain; Muscle Weakness Medical History: Positive for: Osteoarthritis - right hip Negative for: Gout; Rheumatoid Arthritis; Osteomyelitis Constitutional Symptoms (General Health) Complaints and Symptoms: No Complaints or Symptoms Medical History: Past Medical History Notes: High Blood Pressure; COPD, Depression, Pelaez, Kamran ([id number]) Ear/Nose/Mouth/Throat Complaints and Symptoms: No Complaints or Symptoms Medical History: Negative for: Chronic sinus problems/congestion; Middle ear problems Hematologic/Lymphatic Medical History: Negative for: Anemia; Hemophilia; Human Immunodeficiency Virus; Lymphedema; Sickle Cell Disease Genitourinary Complaints and Symptoms: No Complaints or Symptoms Medical History: Negative for: End Stage Renal Disease Immunological Complaints and Symptoms: No Complaints or Symptoms Medical History: Negative for:  Lupus Erythematosus; Raynaudos; Scleroderma Neurologic Complaints and Symptoms: No Complaints or Symptoms Medical History: Negative for: Dementia; Neuropathy; Quadriplegia; Paraplegia Oncologic Medical History: Positive for: Received Radiation - Prostate Cancer June 2014 Negative for: Received Chemotherapy Psychiatric Complaints and Symptoms: No Complaints or Symptoms Medical History: Negative for: Anorexia/bulimia; Confinement Anxiety HBO Extended History Items Sermeno, Blayde ([id number]) Eyes: Cataracts Immunizations Pneumococcal Vaccine: Received Pneumococcal Vaccination: Yes Family and Social History Cancer: Yes -  Mother, Father; Diabetes: No; Heart Disease: No; Hypertension: No; Kidney Disease: No; Lung Disease: No; Seizures: No; Stroke: No; Thyroid Problems: No; Tuberculosis: No; Former smoker - 3 weeks ago; Marital Status - Widowed; Alcohol Use: Never; Drug Use: No History; Caffeine Use: Daily; Advanced Directives: No; Patient does not want information on Advanced Directives; Do not resuscitate: No; Living Will: No; Medical Power of Attorney: No Physician Affirmation I have reviewed and agree with the above information. Electronic Signature(s) Signed: 05/31/2016 9:53:09 AM By: Gretta Cool RN, BSN, Kim RN, BSN Signed: 05/31/2016 3:52:04 PM By: Christin Fudge MD, FACS Entered By: Christin Fudge on 05/31/2016 09:06:10 JAMARKIS, BRANAM ([id number]) -------------------------------------------------------------------------------- SuperBill Details Patient Name: [name] Date of Service: 05/31/2016 Medical Record Number: [id number] Patient Account Number: [id number] Date of Birth/Sex: 1946-08-13 (70 y.o. Male) Treating RN: Cornell Barman Primary Care Physician: PATIENT, NO Other Clinician: Referring Physician: Hinda Kehr Treating Physician/Extender: Frann Rider in Treatment: 0 Diagnosis Coding ICD-10 Codes Code Description [phone] Laceration without foreign body, left  lower leg, initial encounter L97.222 Non-pressure chronic ulcer of left calf with fat layer exposed I89.0 Lymphedema, not elsewhere classified Z87.891 Personal history of nicotine dependence E66.01 Morbid (severe) obesity due to excess calories Facility Procedures CPT4 Code Description: 25956387 99213 - WOUND CARE VISIT-LEV 3 EST PT Modifier: Quantity: 1 CPT4 Code Description: 56433295 11042 - DEB SUBQ TISSUE 20 SQ CM/< ICD-10 Description Diagnosis S81.812A Laceration without foreign body, left lower leg, ini L97.222 Non-pressure chronic ulcer of left calf with fat lay I89.0 Lymphedema, not elsewhere  classified Modifier: tial encounter er exposed Quantity: 1 Physician Procedures CPT4 Code Description: 1884166 06301 - WC PHYS LEVEL 4 - NEW PT ICD-10 Description Diagnosis S81.812A Laceration without foreign body, left lower leg, ini L97.222 Non-pressure chronic ulcer of left calf with fat lay I89.0 Lymphedema, not elsewhere  classified Modifier: 25 tial encounter er exposed Quantity: 1 CPT4 Code Description: 6010932 11042 - WC PHYS SUBQ TISS 20 SQ CM ICD-10 Description Diagnosis S81.812A Laceration without foreign body, left lower leg, ini L97.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

## 2016-05-31 NOTE — Progress Notes (Signed)
SAMEER, TEEPLE ([id number]) Visit Report for 05/31/2016 Abuse/Suicide Risk Screen Details Patient Name: Nathaniel Little, Nathaniel Little Date of Service: 05/31/2016 8:45 AM Medical Record Number: [id number] Patient Account Number: [id number] Date of Birth/Sex: [birth year] (70 y.o. Male) Treating RN: Cornell Barman Primary Care Physician: PATIENT, NO Other Clinician: Referring Physician: Hinda Kehr Treating Physician/Extender: Frann Rider in Treatment: 0 Abuse/Suicide Risk Screen Items Answer ABUSE/SUICIDE RISK SCREEN: Has anyone close to you tried to hurt or harm you recentlyo No Do you feel uncomfortable with anyone in your familyo No Has anyone forced you do things that you didnot want to doo No Do you have any thoughts of harming yourselfo No Patient displays signs or symptoms of abuse and/or neglect. No Electronic Signature(s) Signed: 05/31/2016 9:53:09 AM By: Gretta Cool, RN, BSN, Kim RN, BSN Entered By: Gretta Cool, RN, BSN, Kim on 05/31/2016 08:54:38 Nathaniel Little ([id number]) -------------------------------------------------------------------------------- Activities of Daily Living Details Patient Name: Nathaniel Little Date of Service: 05/31/2016 8:45 AM Medical Record Number: [id number] Patient Account Number: [id number] Date of Birth/Sex: [birth year] (69 y.o. Male) Treating RN: Cornell Barman Primary Care Physician: PATIENT, NO Other Clinician: Referring Physician: Hinda Kehr Treating Physician/Extender: Frann Rider in Treatment: 0 Activities of Daily Living Items Answer Activities of Daily Living (Please select one for each item) Drive Automobile Not Able Take Medications Completely Able Use Telephone Completely Able Care for Appearance Completely Able Use Toilet Completely Able Bath / Shower Completely Able Dress Self Completely Able Feed Self Completely Able Walk Completely Able Get In / Out Bed Completely Able Housework Completely Able Prepare Meals Completely Able Handle Money  Completely Able Shop for Self Completely Able Electronic Signature(s) Signed: 05/31/2016 9:53:09 AM By: Gretta Cool, RN, BSN, Kim RN, BSN Entered By: Gretta Cool, RN, BSN, Kim on 05/31/2016 08:55:02 Nathaniel Little ([id number]) -------------------------------------------------------------------------------- Education Assessment Details Patient Name: Nathaniel Little Date of Service: 05/31/2016 8:45 AM Medical Record Number: [id number] Patient Account Number: [id number] Date of Birth/Sex: 10/02/1946 (69 y.o. Male) Treating RN: Cornell Barman Primary Care Physician: PATIENT, NO Other Clinician: Referring Physician: Hinda Kehr Treating Physician/Extender: Frann Rider in Treatment: 0 Primary Learner Assessed: Patient Learning Preferences/Education Level/Primary Language Learning Preference: Explanation, Demonstration Highest Education Level: College or Above Preferred Language: English Cognitive Barrier Assessment/Beliefs Language Barrier: No Translator Needed: No Memory Deficit: No Emotional Barrier: No Cultural/Religious Beliefs Affecting Medical No Care: Physical Barrier Assessment Impaired Vision: No Impaired Hearing: No Decreased Hand dexterity: No Knowledge/Comprehension Assessment Knowledge Level: High Comprehension Level: High Ability to understand written High instructions: Ability to understand verbal High instructions: Motivation Assessment Anxiety Level: Calm Cooperation: Cooperative Education Importance: Acknowledges Need Interest in Health Problems: Asks Questions Perception: Coherent Willingness to Engage in Self- High Management Activities: Readiness to Engage in Self- High Management Activities: Electronic Signature(s) Nathaniel Little, Nathaniel Little ([id number]) Signed: 05/31/2016 9:53:09 AM By: Gretta Cool, RN, BSN, Kim RN, BSN Entered By: Gretta Cool, RN, BSN, Kim on 05/31/2016 08:55:59 Nathaniel Little, Nathaniel Little  ([id number]) -------------------------------------------------------------------------------- Fall Risk Assessment Details Patient Name: Nathaniel Little Date of Service: 05/31/2016 8:45 AM Medical Record Number: [id number] Patient Account Number: [id number] Date of Birth/Sex: September 22, 1946 (69 y.o. Male) Treating RN: Cornell Barman Primary Care Physician: PATIENT, NO Other Clinician: Referring Physician: Hinda Kehr Treating Physician/Extender: Frann Rider in Treatment: 0 Fall Risk Assessment Items Have you had 2 or more falls in the last 12 monthso 0 No Have you had any fall that resulted in injury in the last 12 monthso 0 No FALL RISK ASSESSMENT: History of falling - immediate or within 3 months 0 No Secondary diagnosis 0 No Ambulatory  aid None/bed rest/wheelchair/nurse 0 No Crutches/cane/walker 15 Yes Furniture 0 No IV Access/Saline Lock 0 No Gait/Training Normal/bed rest/immobile 0 Yes Weak 0 No Impaired 0 No Mental Status Oriented to own ability 0 Yes Electronic Signature(s) Signed: 05/31/2016 9:53:09 AM By: Gretta Cool, RN, BSN, Kim RN, BSN Entered By: Gretta Cool, RN, BSN, Kim on 05/31/2016 08:56:24 Nathaniel Little ([id number]) -------------------------------------------------------------------------------- Foot Assessment Details Patient Name: Nathaniel Little Date of Service: 05/31/2016 8:45 AM Medical Record Number: [id number] Patient Account Number: [id number] Date of Birth/Sex: 04/30/1946 (69 y.o. Male) Treating RN: Cornell Barman Primary Care Physician: PATIENT, NO Other Clinician: Referring Physician: Hinda Kehr Treating Physician/Extender: Frann Rider in Treatment: 0 Foot Assessment Items Site Locations + = Sensation present, - = Sensation absent, C = Callus, U = Ulcer R = Redness, W = Warmth, M = Maceration, PU = Pre-ulcerative lesion F = Fissure, S = Swelling, D = Dryness Assessment Right: Left: Other Deformity: No No Prior Foot Ulcer: No No Prior Amputation:  No No Charcot Joint: No No Ambulatory Status: Ambulatory Without Help Gait: Steady Electronic Signature(s) Signed: 05/31/2016 9:53:09 AM By: Gretta Cool, RN, BSN, Kim RN, BSN Entered By: Gretta Cool, RN, BSN, Kim on 05/31/2016 08:56:53 Nathaniel Little ([id number]) -------------------------------------------------------------------------------- Nutrition Risk Assessment Details Patient Name: Nathaniel Little Date of Service: 05/31/2016 8:45 AM Medical Record Number: [id number] Patient Account Number: [id number] Date of Birth/Sex: 05/03/1946 (69 y.o. Male) Treating RN: Cornell Barman Primary Care Physician: PATIENT, NO Other Clinician: Referring Physician: Hinda Kehr Treating Physician/Extender: Frann Rider in Treatment: 0 Height (in): 69 Weight (lbs): 260 Body Mass Index (BMI): 38.4 Nutrition Risk Assessment Items NUTRITION RISK SCREEN: I have an illness or condition that made me change the kind and/or 0 No amount of food I eat I eat fewer than two meals per day 0 No I eat few fruits and vegetables, or milk products 0 No I have three or more drinks of beer, liquor or wine almost every day 0 No I have tooth or mouth problems that make it hard for me to eat 0 No I don't always have enough money to buy the food I need 0 No I eat alone most of the time 0 No I take three or more different prescribed or over-the-counter drugs a 0 No day Without wanting to, I have lost or gained 10 pounds in the last six 0 No months I am not always physically able to shop, cook and/or feed myself 0 No Nutrition Protocols Good Risk Protocol 0 No interventions needed Moderate Risk Protocol Electronic Signature(s) Signed: 05/31/2016 9:53:09 AM By: Gretta Cool, RN, BSN, Kim RN, BSN Entered By: Gretta Cool, RN, BSN, Kim on 05/31/2016 08:56:32

## 2016-05-31 NOTE — Progress Notes (Signed)
Nathaniel Little, Nathaniel Little ([id number]) Visit Report for 05/31/2016 Allergy List Details Patient Name: Nathaniel Little, Nathaniel Little Date of Service: 05/31/2016 8:45 AM Medical Record Number: [id number] Patient Account Number: [id number] Date of Birth/Sex: 1946-04-03 (70 y.o. Male) Treating Little: Nathaniel Little Primary Care Physician: PATIENT, NO Other Clinician: Referring Physician: Hinda Little Treating Physician/Extender: Nathaniel Little in Treatment: 0 Allergies Active Allergies codeine Allergy Notes Electronic Signature(s) Signed: 05/31/2016 9:53:09 AM By: Nathaniel Cool, Little, BSN, Nathaniel Little, BSN Entered By: Nathaniel Cool, Little, BSN, Nathaniel on 05/31/2016 08:46:34 Nathaniel Little ([id number]) -------------------------------------------------------------------------------- Arrival Information Details Patient Name: Nathaniel Little Date of Service: 05/31/2016 8:45 AM Medical Record Number: [id number] Patient Account Number: [id number] Date of Birth/Sex: 02/22/1946 (69 y.o. Male) Treating Little: Nathaniel Little Primary Care Physician: PATIENT, NO Other Clinician: Referring Physician: Hinda Little Treating Physician/Extender: Nathaniel Little in Treatment: 0 Visit Information Patient Arrived: Cane Arrival Time: 08:31 Accompanied By: self Transfer Assistance: None Patient Identification Verified: Yes Secondary Verification Process Yes Completed: Patient Has Alerts: Yes Patient Alerts: Patient on Blood Thinner Aspirin 81 mg NO DM Electronic Signature(s) Signed: 05/31/2016 9:53:09 AM By: Nathaniel Cool, Little, BSN, Nathaniel Little, BSN Entered By: Nathaniel Cool, Little, BSN, Nathaniel on 05/31/2016 08:40:50 Nathaniel Little ([id number]) -------------------------------------------------------------------------------- Clinic Level of Care Assessment Details Patient Name: Nathaniel Little Date of Service: 05/31/2016 8:45 AM Medical Record Number: [id number] Patient Account Number: [id number] Date of Birth/Sex: Sep 26, 1946 (69 y.o. Male) Treating Little: Nathaniel Little Primary Care Physician:  PATIENT, NO Other Clinician: Referring Physician: Hinda Little Treating Physician/Extender: Nathaniel Little in Treatment: 0 Clinic Level of Care Assessment Items TOOL 1 Quantity Score '[]'$  - Use when EandM and Procedure is performed on INITIAL visit 0 ASSESSMENTS - Nursing Assessment / Reassessment X - General Physical Exam (combine w/ comprehensive assessment (listed just 1 20 below) when performed on new pt. evals) X - Comprehensive Assessment (HX, ROS, Risk Assessments, Wounds Hx, etc.) 1 25 ASSESSMENTS - Wound and Skin Assessment / Reassessment '[]'$  - Dermatologic / Skin Assessment (not related to wound area) 0 ASSESSMENTS - Ostomy and/or Continence Assessment and Care '[]'$  - Incontinence Assessment and Management 0 '[]'$  - Ostomy Care Assessment and Management (repouching, etc.) 0 PROCESS - Coordination of Care X - Simple Patient / Family Education for ongoing care 1 15 '[]'$  - Complex (extensive) Patient / Family Education for ongoing care 0 '[]'$  - Staff obtains Programmer, systems, Records, Test Results / Process Orders 0 X - Staff telephones HHA, Nursing Homes / Clarify orders / etc 1 10 '[]'$  - Routine Transfer to another Facility (non-emergent condition) 0 '[]'$  - Routine Hospital Admission (non-emergent condition) 0 X - New Admissions / Biomedical engineer / Ordering NPWT, Apligraf, etc. 1 15 '[]'$  - Emergency Hospital Admission (emergent condition) 0 PROCESS - Special Needs '[]'$  - Pediatric / Minor Patient Management 0 '[]'$  - Isolation Patient Management 0 Nathaniel Little, Nathaniel Little ([id number]) '[]'$  - Hearing / Language / Visual special needs 0 '[]'$  - Assessment of Community assistance (transportation, D/C planning, etc.) 0 '[]'$  - Additional assistance / Altered mentation 0 '[]'$  - Support Surface(s) Assessment (bed, cushion, seat, etc.) 0 INTERVENTIONS - Miscellaneous '[]'$  - External ear exam 0 '[]'$  - Patient Transfer (multiple staff / Civil Service fast streamer / Similar devices) 0 '[]'$  - Simple Staple / Suture removal (25 or less)  0 '[]'$  - Complex Staple / Suture removal (26 or more) 0 '[]'$  - Hypo/Hyperglycemic Management (do not check if billed separately) 0 X - Ankle / Brachial Index (ABI) - do not check if billed separately 1 15 Has the patient been seen at  the hospital within the last three years: Yes Total Score: 100 Level Of Care: New/Established - Level 3 Electronic Signature(s) Signed: 05/31/2016 9:53:09 AM By: Nathaniel Cool, Little, BSN, Nathaniel Little, BSN Entered By: Nathaniel Cool, Little, BSN, Nathaniel on 05/31/2016 09:38:28 Nathaniel Little, Nathaniel Little ([id number]) -------------------------------------------------------------------------------- Encounter Discharge Information Details Patient Name: Nathaniel Little Date of Service: 05/31/2016 8:45 AM Medical Record Number: [id number] Patient Account Number: [id number] Date of Birth/Sex: 12-01-1945 (70 y.o. Male) Treating Little: Nathaniel Little Primary Care Physician: PATIENT, NO Other Clinician: Referring Physician: Hinda Little Treating Physician/Extender: Nathaniel Little in Treatment: 0 Encounter Discharge Information Items Discharge Pain Level: 3 Discharge Condition: Stable Ambulatory Status: Cane Discharge Destination: Home Transportation: Private Auto Accompanied By: self Schedule Follow-up Appointment: Yes Medication Reconciliation completed Yes and provided to Patient/Care [name]: Provided on Clinical Summary of Care: 05/31/2016 Form Type Recipient Paper Patient HB Electronic Signature(s) Signed: 05/31/2016 9:53:09 AM By: Nathaniel Cool Little, BSN, Nathaniel Little, BSN Previous Signature: 05/31/2016 9:21:39 AM Version By: Nathaniel Little Entered By: Nathaniel Cool Little, BSN, Nathaniel on 05/31/2016 09:28:26 Nathaniel Little, Nathaniel Little ([id number]) -------------------------------------------------------------------------------- Lower Extremity Assessment Details Patient Name: Nathaniel Little Date of Service: 05/31/2016 8:45 AM Medical Record Number: [id number] Patient Account Number: [id number] Date of Birth/Sex: [birth year] (69 y.o. Male) Treating  Little: Nathaniel Little Primary Care Physician: PATIENT, NO Other Clinician: Referring Physician: Hinda Little Treating Physician/Extender: Nathaniel Little in Treatment: 0 Edema Assessment Assessed: [Left: Yes] [Right: No] E[Left: dema] [Right: :] Calf Left: Right: Point of Measurement: 32 cm From Medial Instep 43.5 cm 43.1 cm Ankle Left: Right: Point of Measurement: 11 cm From Medial Instep 28.8 cm 28 cm Vascular Assessment Claudication: Claudication Assessment [Left:None] Pulses: Posterior Tibial Palpable: [Left:Yes] Dorsalis Pedis Palpable: [Left:Yes] Extremity colors, hair growth, and conditions: Extremity Color: [Left:Normal] Hair Growth on Extremity: [Left:Yes] Temperature of Extremity: [Left:Warm] Capillary Refill: [Left:< 3 seconds] Blood Pressure: Brachial: [Left:120] [Right:118] Dorsalis Pedis: 120 [Left:Dorsalis Pedis: 160] Ankle: Posterior Tibial: 160 [Left:Posterior Tibial: 160 1.33] [Right:1.33] Toe Nail Assessment Left: Right: Thick: Yes Discolored: Yes Deformed: Yes Improper Length and HygieneCLEVER, Nathaniel Little ([id number]) Electronic Signature(s) Signed: 05/31/2016 9:53:09 AM By: Nathaniel Cool, Little, BSN, Nathaniel Little, BSN Entered By: Nathaniel Cool, Little, BSN, Nathaniel on 05/31/2016 09:03:18 Nathaniel Little ([id number]) -------------------------------------------------------------------------------- Multi Wound Chart Details Patient Name: Nathaniel Little Date of Service: 05/31/2016 8:45 AM Medical Record Number: [id number] Patient Account Number: [id number] Date of Birth/Sex: 03/23/1946 (69 y.o. Male) Treating Little: Nathaniel Little Primary Care Physician: PATIENT, NO Other Clinician: Referring Physician: Hinda Little Treating Physician/Extender: Nathaniel Little in Treatment: 0 Vital Signs Height(in): 69 Pulse(bpm): 63 Weight(lbs): 260 Blood Pressure 142/108 (mmHg): Body Mass Index(BMI): 38 Temperature(F): 97.7 Respiratory Rate 20 (breaths/min): Photos: [N/A:N/A] Wound  Location: Left Lower Leg N/A N/A Wounding Event: Trauma N/A N/A Primary Etiology: Trauma, Other N/A N/A Date Acquired: 05/17/2016 N/A N/A Weeks of Treatment: 0 N/A N/A Wound Status: Open N/A N/A Measurements L x W x D 6x1x0.3 N/A N/A (cm) Area (cm) : 4.712 N/A N/A Volume (cm) : 1.414 N/A N/A % Reduction in Area: 0.00% N/A N/A % Reduction in Volume: 0.00% N/A N/A Classification: Full Thickness Without N/A N/A Exposed Support Structures Exudate Amount: Medium N/A N/A Exudate Type: Serous N/A N/A Exudate Color: amber N/A N/A Wound Margin: Flat and Intact N/A N/A Granulation Amount: Small (1-33%) N/A N/A Necrotic Amount: Large (67-100%) N/A N/A Necrotic Tissue: Eschar, Adherent Slough N/A N/A Exposed Structures: N/A Nathaniel Little, Nathaniel Little ([id number]) Fascia: No Fat: No Tendon: No Muscle: No Joint: No Bone: No Limited to Skin Breakdown Epithelialization: Small (1-33%)  N/A N/A Periwound Skin Texture: Induration: Yes N/A N/A Scarring: Yes Edema: No Excoriation: No Callus: No Crepitus: No Fluctuance: No Friable: No Rash: No Periwound Skin Maceration: No N/A N/A Moisture: Moist: No Dry/Scaly: No Periwound Skin Color: Atrophie Blanche: No N/A N/A Cyanosis: No Ecchymosis: No Erythema: No Hemosiderin Staining: No Mottled: No Pallor: No Rubor: No Temperature: No Abnormality N/A N/A Tenderness on No N/A N/A Palpation: Wound Preparation: Ulcer Cleansing: N/A N/A Rinsed/Irrigated with Saline Topical Anesthetic Applied: Other: liodocaine 4% Treatment Notes Electronic Signature(s) Signed: 05/31/2016 9:53:09 AM By: Nathaniel Cool, Little, BSN, Nathaniel Little, BSN Entered By: Nathaniel Cool, Little, BSN, Nathaniel on 05/31/2016 09:08:01 Nathaniel Little, Nathaniel Little ([id number]) -------------------------------------------------------------------------------- Multi-Disciplinary Care Plan Details Patient Name: Nathaniel Little Date of Service: 05/31/2016 8:45 AM Medical Record Number: [id number] Patient Account Number:  [id number] Date of Birth/Sex: 05-Nov-1945 (69 y.o. Male) Treating Little: Nathaniel Little Primary Care Physician: PATIENT, NO Other Clinician: Referring Physician: Hinda Little Treating Physician/Extender: Nathaniel Little in Treatment: 0 Active Inactive Abuse / Safety / Falls / Self Care Management Nursing Diagnoses: Potential for falls Goals: Patient will remain injury free Date Initiated: 05/31/2016 Goal Status: Active Interventions: Assess fall risk on admission and as needed Notes: Necrotic Tissue Nursing Diagnoses: Impaired tissue integrity related to necrotic/devitalized tissue Goals: Necrotic/devitalized tissue will be minimized in the wound bed Date Initiated: 05/31/2016 Goal Status: Active Interventions: Assess patient pain level pre-, during and post procedure and prior to discharge Treatment Activities: Apply topical anesthetic as ordered : 05/31/2016 Notes: Orientation to the Wound Care Program Nursing Diagnoses: Knowledge deficit related to the wound healing center program Nathaniel Little, Nathaniel Little ([id number]) Goals: Patient/caregiver will verbalize understanding of the [place] Program Date Initiated: 05/31/2016 Goal Status: Active Interventions: Provide education on orientation to the wound center Notes: Wound/Skin Impairment Nursing Diagnoses: Impaired tissue integrity Knowledge deficit related to ulceration/compromised skin integrity Goals: Patient/caregiver will verbalize understanding of skin care regimen Date Initiated: 05/31/2016 Goal Status: Active Ulcer/skin breakdown will have a volume reduction of 30% by week 4 Date Initiated: 05/31/2016 Goal Status: Active Interventions: Assess patient/caregiver ability to obtain necessary supplies Assess ulceration(s) every visit Treatment Activities: Topical wound management initiated : 05/31/2016 Notes: Electronic Signature(s) Signed: 05/31/2016 9:53:09 AM By: Nathaniel Cool, Little, BSN, Nathaniel Little, BSN Entered By: Nathaniel Cool,  Little, BSN, Nathaniel on 05/31/2016 09:03:50 Nathaniel Little ([id number]) -------------------------------------------------------------------------------- Pain Assessment Details Patient Name: Nathaniel Little Date of Service: 05/31/2016 8:45 AM Medical Record Number: [id number] Patient Account Number: [id number] Date of Birth/Sex: October 14, 1945 (69 y.o. Male) Treating Little: Nathaniel Little Primary Care Physician: PATIENT, NO Other Clinician: Referring Physician: Hinda Little Treating Physician/Extender: Nathaniel Little in Treatment: 0 Active Problems Location of Pain Severity and Description of Pain Patient Has Paino No Site Locations With Dressing Change: No Rate the pain. Current Pain Level: 0 Worst Pain Level: 5 Character of Pain Describe the Pain: Shooting, Tender Pain Management and Medication Current Pain Management: Notes Topical or injectable lidocaine is offered to patient for acute pain when surgical debridement is performed. If needed, Patient is instructed to use over the counter pain medication for the following 24-48 hours after debridement. Wound care MDs do not prescribed pain medications. Patient has chronic pain or uncontrolled pain. Patient has been instructed to make an appointment with their Primary Care Physician for pain management Electronic Signature(s) Signed: 05/31/2016 9:53:09 AM By: Nathaniel Cool, Little, BSN, Nathaniel Little, BSN Entered By: Nathaniel Cool, Little, BSN, Nathaniel on 05/31/2016 08:41:50 Nathaniel Little ([id number]) -------------------------------------------------------------------------------- Patient/Caregiver Education Details Patient Name: Nathaniel Little Date of Service: 05/31/2016 8:45 AM  Medical Record Number: [id number] Patient Account Number: [id number] Date of Birth/Gender: 29-Apr-1946 (70 y.o. Male) Treating Little: Nathaniel Little Primary Care Physician: PATIENT, NO Other Clinician: Referring Physician: Hinda Little Treating Physician/Extender: Nathaniel Little in Treatment:  0 Education Assessment Education Provided To: Patient Education Topics Provided Venous: Handouts: Controlling Swelling with Compression Stockings Methods: Demonstration, Explain/Verbal Responses: State content correctly Wound/Skin Impairment: Handouts: Caring for Your Ulcer, Other: daily shower and dressing changes Methods: Demonstration, Explain/Verbal Responses: State content correctly Electronic Signature(s) Signed: 05/31/2016 9:53:09 AM By: Nathaniel Cool, Little, BSN, Nathaniel Little, BSN Entered By: Nathaniel Cool, Little, BSN, Nathaniel on 05/31/2016 09:29:17 Nathaniel Little ([id number]) -------------------------------------------------------------------------------- Wound Assessment Details Patient Name: Nathaniel Little Date of Service: 05/31/2016 8:45 AM Medical Record Number: [id number] Patient Account Number: [id number] Date of Birth/Sex: 04/22/1946 (69 y.o. Male) Treating Little: Nathaniel Little Primary Care Physician: PATIENT, NO Other Clinician: Referring Physician: Hinda Little Treating Physician/Extender: Nathaniel Little in Treatment: 0 Wound Status Wound Number: 1 Primary Etiology: Trauma, Other Wound Location: Left Lower Leg Wound Status: Open Wounding Event: Trauma Date Acquired: 05/17/2016 Weeks Of Treatment: 0 Clustered Wound: No Photos Wound Measurements Length: (cm) 6 Width: (cm) 1 Depth: (cm) 0.3 Area: (cm) 4.712 Volume: (cm) 1.414 % Reduction in Area: 0% % Reduction in Volume: 0% Epithelialization: Small (1-33%) Tunneling: No Undermining: No Wound Description Full Thickness Without Exposed Classification: Support Structures Wound Margin: Flat and Intact Exudate Medium Amount: Exudate Type: Serous Exudate Color: amber Wound Bed Granulation Amount: Small (1-33%) Exposed Structure Necrotic Amount: Large (67-100%) Fascia Exposed: No Necrotic Quality: Eschar, Adherent Slough Fat Layer Exposed: No Nathaniel Little, Nathaniel Little ([id number]) Tendon Exposed: No Muscle Exposed: No Joint Exposed:  No Bone Exposed: No Limited to Skin Breakdown Periwound Skin Texture Texture Color No Abnormalities Noted: No No Abnormalities Noted: No Callus: No Atrophie Blanche: No Crepitus: No Cyanosis: No Excoriation: No Ecchymosis: No Fluctuance: No Erythema: No Friable: No Hemosiderin Staining: No Induration: Yes Mottled: No Localized Edema: No Pallor: No Rash: No Rubor: No Scarring: Yes Temperature / Pain Moisture Temperature: No Abnormality No Abnormalities Noted: No Dry / Scaly: No Maceration: No Moist: No Wound Preparation Ulcer Cleansing: Rinsed/Irrigated with Saline Topical Anesthetic Applied: Other: liodocaine 4%, Treatment Notes Wound #1 (Left Lower Leg) 1. Cleansed with: Clean wound with Normal Saline May Shower, gently pat wound dry prior to applying new dressing. 2. Anesthetic Topical Lidocaine 4% cream to wound bed prior to debridement 4. Dressing Applied: Medihoney Gel 5. Secondary [place] 7. Secured with Other (specify in notes) Notes stretch net; patient to wear compression socks once he gets home. Will wear daily from morning to bedtime. Electronic Signature(s) Signed: 05/31/2016 9:53:09 AM By: Nathaniel Cool Little, BSN, Nathaniel Little, BSN Nathaniel Little, Nathaniel Little ([id number]) Entered By: Nathaniel Cool Little, BSN, Nathaniel on 05/31/2016 08:45:44 Nathaniel Little, Nathaniel Little ([id number]) -------------------------------------------------------------------------------- Rachel Details Patient Name: Nathaniel Little Date of Service: 05/31/2016 8:45 AM Medical Record Number: [id number] Patient Account Number: [id number] Date of Birth/Sex: 10/06/1945 (69 y.o. Male) Treating Little: Nathaniel Little Primary Care Physician: PATIENT, NO Other Clinician: Referring Physician: Hinda Little Treating Physician/Extender: Nathaniel Little in Treatment: 0 Vital Signs Time Taken: 08:41 Temperature (F): 97.7 Height (in): 69 Pulse (bpm): 63 Weight (lbs): 260 Respiratory Rate (breaths/min): 20 Body Mass  Index (BMI): 38.4 Blood Pressure (mmHg): 142/108 Reference Range: 80 - 120 mg / dl Electronic Signature(s) Signed: 05/31/2016 9:53:09 AM By: Nathaniel Cool, Little, BSN, Nathaniel Little, BSN Entered By: Nathaniel Cool, Little, BSN, Nathaniel on 05/31/2016 08:42:18

## 2016-06-04 ENCOUNTER — Encounter: Payer: Commercial Managed Care - HMO | Attending: Surgery | Admitting: Surgery

## 2016-06-04 DIAGNOSIS — Z79899 Other long term (current) drug therapy: Secondary | ICD-10-CM | POA: Diagnosis not present

## 2016-06-04 DIAGNOSIS — L97222 Non-pressure chronic ulcer of left calf with fat layer exposed: Secondary | ICD-10-CM | POA: Insufficient documentation

## 2016-06-04 DIAGNOSIS — Z8546 Personal history of malignant neoplasm of prostate: Secondary | ICD-10-CM | POA: Insufficient documentation

## 2016-06-04 DIAGNOSIS — I890 Lymphedema, not elsewhere classified: Secondary | ICD-10-CM | POA: Insufficient documentation

## 2016-06-04 DIAGNOSIS — J449 Chronic obstructive pulmonary disease, unspecified: Secondary | ICD-10-CM | POA: Diagnosis not present

## 2016-06-04 DIAGNOSIS — M1611 Unilateral primary osteoarthritis, right hip: Secondary | ICD-10-CM | POA: Diagnosis not present

## 2016-06-04 DIAGNOSIS — I10 Essential (primary) hypertension: Secondary | ICD-10-CM | POA: Insufficient documentation

## 2016-06-04 DIAGNOSIS — X58XXXA Exposure to other specified factors, initial encounter: Secondary | ICD-10-CM | POA: Diagnosis not present

## 2016-06-04 DIAGNOSIS — Z87891 Personal history of nicotine dependence: Secondary | ICD-10-CM | POA: Diagnosis not present

## 2016-06-04 DIAGNOSIS — S81812A Laceration without foreign body, left lower leg, initial encounter: Secondary | ICD-10-CM | POA: Insufficient documentation

## 2016-06-04 DIAGNOSIS — F329 Major depressive disorder, single episode, unspecified: Secondary | ICD-10-CM | POA: Insufficient documentation

## 2016-06-04 DIAGNOSIS — Z7982 Long term (current) use of aspirin: Secondary | ICD-10-CM | POA: Diagnosis not present

## 2016-06-08 NOTE — Progress Notes (Signed)
Nathaniel Little, Nathaniel Little ([id number]) Visit Report for 06/04/2016 Arrival Information Details Patient Name: Nathaniel Little Date of Service: 06/04/2016 2:15 PM Medical Record Number: [id number] Patient Account Number: [id number] Date of Birth/Sex: 1946/03/24 (70 y.o. Male) Treating RN: Carolyne Fiscal, Debi Primary Care Physician: PATIENT, NO Other Clinician: Referring Physician: Hinda Kehr Treating Physician/Extender: Frann Rider in Treatment: 0 Visit Information History Since Last Visit All ordered tests and consults were completed: No Patient Arrived: Cane Added or deleted any medications: No Arrival Time: 14:15 Any new allergies or adverse reactions: No Accompanied By: self Had a fall or experienced change in No Transfer Assistance: None activities of daily living that may affect Patient Identification Verified: Yes risk of falls: Secondary Verification Process Yes Signs or symptoms of abuse/neglect since last No Completed: visito Patient Has Alerts: Yes Hospitalized since last visit: No Patient Alerts: Patient on Blood Pain Present Now: No Thinner Aspirin 81 mg NO DM Electronic Signature(s) Signed: 06/08/2016 4:41:03 PM By: Alric Quan Entered By: Alric Quan on 06/04/2016 14:15:41 Nathaniel Little ([id number]) -------------------------------------------------------------------------------- Encounter Discharge Information Details Patient Name: Nathaniel Little Date of Service: 06/04/2016 2:15 PM Medical Record Number: [id number] Patient Account Number: [id number] Date of Birth/Sex: 1946/09/18 (70 y.o. Male) Treating RN: Carolyne Fiscal, Debi Primary Care Physician: PATIENT, NO Other Clinician: Referring Physician: Hinda Kehr Treating Physician/Extender: Frann Rider in Treatment: 0 Encounter Discharge Information Items Discharge Pain Level: 0 Discharge Condition: Stable Ambulatory Status: Cane Discharge Destination: Home Private Transportation: Auto Accompanied  By: self Schedule Follow-up Appointment: Yes Medication Reconciliation completed and Yes provided to Patient/Care Jeanclaude Wentworth: Clinical Summary of Care: Electronic Signature(s) Signed: 06/08/2016 4:41:03 PM By: Alric Quan Previous Signature: 06/04/2016 2:44:21 PM Version By: Ruthine Dose Entered By: Alric Quan on 06/04/2016 14:44:27 Nathaniel Little, Nathaniel Little ([id number]) -------------------------------------------------------------------------------- Lower Extremity Assessment Details Patient Name: Nathaniel Little Date of Service: 06/04/2016 2:15 PM Medical Record Number: [id number] Patient Account Number: [id number] Date of Birth/Sex: Apr 09, 1946 (69 y.o. Male) Treating RN: Carolyne Fiscal, Debi Primary Care Physician: PATIENT, NO Other Clinician: Referring Physician: Hinda Kehr Treating Physician/Extender: Frann Rider in Treatment: 0 Vascular Assessment Pulses: Posterior Tibial Dorsalis Pedis Palpable: [Left:Yes] Extremity colors, hair growth, and conditions: Extremity Color: [Left:Normal] Temperature of Extremity: [Left:Warm] Capillary Refill: [Left:< 3 seconds] Toe Nail Assessment Left: Right: Thick: Yes Discolored: Yes Deformed: Yes Improper Length and Hygiene: Yes Electronic Signature(s) Signed: 06/08/2016 4:41:03 PM By: Alric Quan Entered By: Alric Quan on 06/04/2016 14:21:06 Nathaniel Little ([id number]) -------------------------------------------------------------------------------- Multi Wound Chart Details Patient Name: Nathaniel Little Date of Service: 06/04/2016 2:15 PM Medical Record Number: [id number] Patient Account Number: [id number] Date of Birth/Sex: Apr 21, 1946 (70 y.o. Male) Treating RN: Montey Hora Primary Care Physician: PATIENT, NO Other Clinician: Referring Physician: Hinda Kehr Treating Physician/Extender: Frann Rider in Treatment: 0 Vital Signs Height(in): 69 Pulse(bpm): 73 Weight(lbs): 260 Blood Pressure 135/72 (mmHg): Body  Mass Index(BMI): 38 Temperature(F): 97.5 Respiratory Rate 20 (breaths/min): Photos: [2:No Photos] [N/A:N/A] Wound Location: Left Lower Leg Lower Leg - Inferior N/A Wounding Event: Trauma Gradually Appeared N/A Primary Etiology: Trauma, Other To be determined N/A Secondary Etiology: Lymphedema N/A N/A Comorbid History: Cataracts, Chronic Cataracts, Chronic N/A Obstructive Pulmonary Obstructive Pulmonary Disease (COPD), Disease (COPD), Hypertension, Hypertension, Osteoarthritis, Received Osteoarthritis, Received Radiation Radiation Date Acquired: 05/17/2016 06/03/2016 N/A Weeks of Treatment: 0 0 N/A Wound Status: Open Open N/A Measurements L x W x D 4x0.9x0.3 0.6x0.6x0.1 N/A (cm) Area (cm) : 2.827 0.283 N/A Volume (cm) : 0.848 0.028 N/A % Reduction in Area: 40.00% N/A N/A % Reduction in Volume: 40.00% N/A N/A Classification: Full Thickness  Without Partial Thickness N/A Exposed Support Structures Exudate Amount: Large Large N/A Nathaniel Little, Nathaniel Little ([id number]) Exudate Type: Serous Serous N/A Exudate Color: amber amber N/A Wound Margin: Flat and Intact Distinct, outline attached N/A Granulation Amount: Small (1-33%) Small (1-33%) N/A Granulation Quality: N/A Red N/A Necrotic Amount: Large (67-100%) Large (67-100%) N/A Necrotic Tissue: Eschar, Adherent Nathaniel Little N/A Exposed Structures: Fascia: No Fascia: No N/A Fat: No Fat: No Tendon: No Tendon: No Muscle: No Muscle: No Joint: No Joint: No Bone: No Bone: No Limited to Skin Limited to Skin Breakdown Breakdown Epithelialization: Small (1-33%) N/A N/A Periwound Skin Texture: Induration: Yes No Abnormalities Noted N/A Scarring: Yes Edema: No Excoriation: No Callus: No Crepitus: No Fluctuance: No Friable: No Rash: No Periwound Skin Maceration: No Moist: Yes N/A Moisture: Moist: No Dry/Scaly: No Periwound Skin Color: Atrophie Blanche: No No Abnormalities Noted N/A Cyanosis: No Ecchymosis:  No Erythema: No Hemosiderin Staining: No Mottled: No Pallor: No Rubor: No Temperature: No Abnormality No Abnormality N/A Tenderness on No Yes N/A Palpation: Wound Preparation: Ulcer Cleansing: Ulcer Cleansing: N/A Rinsed/Irrigated with Rinsed/Irrigated with Saline Saline Topical Anesthetic Topical Anesthetic Applied: Other: liodocaine Applied: Other: lidocaine 4% 4% Treatment Notes AUM, CAGGIANO ([id number]) Electronic Signature(s) Signed: 06/04/2016 4:27:47 PM By: Montey Hora Entered By: Montey Hora on 06/04/2016 14:35:13 Nathaniel Little, Nathaniel Little ([id number]) -------------------------------------------------------------------------------- Multi-Disciplinary Care Plan Details Patient Name: Nathaniel Little Date of Service: 06/04/2016 2:15 PM Medical Record Number: [id number] Patient Account Number: [id number] Date of Birth/Sex: 1946-05-29 (69 y.o. Male) Treating RN: Montey Hora Primary Care Physician: PATIENT, NO Other Clinician: Referring Physician: Hinda Kehr Treating Physician/Extender: Frann Rider in Treatment: 0 Active Inactive Abuse / Safety / Falls / Self Care Management Nursing Diagnoses: Potential for falls Goals: Patient will remain injury free Date Initiated: 05/31/2016 Goal Status: Active Interventions: Assess fall risk on admission and as needed Notes: Necrotic Tissue Nursing Diagnoses: Impaired tissue integrity related to necrotic/devitalized tissue Goals: Necrotic/devitalized tissue will be minimized in the wound bed Date Initiated: 05/31/2016 Goal Status: Active Interventions: Assess patient pain level pre-, during and post procedure and prior to discharge Treatment Activities: Apply topical anesthetic as ordered : 05/31/2016 Notes: Orientation to the Wound Care Program Nursing Diagnoses: Knowledge deficit related to the wound healing center program Nathaniel Little, Nathaniel Little ([id number]) Goals: Patient/caregiver will verbalize understanding of the [employer] Program Date Initiated: 05/31/2016 Goal Status: Active Interventions: Provide education on orientation to the wound center Notes: Wound/Skin Impairment Nursing Diagnoses: Impaired tissue integrity Knowledge deficit related to ulceration/compromised skin integrity Goals: Patient/caregiver will verbalize understanding of skin care regimen Date Initiated: 05/31/2016 Goal Status: Active Ulcer/skin breakdown will have a volume reduction of 30% by week 4 Date Initiated: 05/31/2016 Goal Status: Active Interventions: Assess patient/caregiver ability to obtain necessary supplies Assess ulceration(s) every visit Treatment Activities: Topical wound management initiated : 05/31/2016 Notes: Electronic Signature(s) Signed: 06/04/2016 4:27:47 PM By: Montey Hora Entered By: Montey Hora on 06/04/2016 14:34:43 Nathaniel Little, Nathaniel Little ([id number]) -------------------------------------------------------------------------------- Pain Assessment Details Patient Name: Nathaniel Little Date of Service: 06/04/2016 2:15 PM Medical Record Number: [id number] Patient Account Number: [id number] Date of Birth/Sex: May 22, 1946 (70 y.o. Male) Treating RN: Carolyne Fiscal, Debi Primary Care Physician: PATIENT, NO Other Clinician: Referring Physician: Hinda Kehr Treating Physician/Extender: Frann Rider in Treatment: 0 Active Problems Location of Pain Severity and Description of Pain Patient Has Paino No Site Locations With Dressing Change: No Pain Management and Medication Current Pain Management: Electronic Signature(s) Signed: 06/08/2016 4:41:03 PM By: Alric Quan Entered By: Alric Quan on 06/04/2016 14:15:46 Nathaniel Little, Nathaniel Little ([id number]) --------------------------------------------------------------------------------  Patient/Caregiver Education Details Patient Name: MAREON, ROBINETTE Date of Service: 06/04/2016 2:15 PM Medical Record Number: [id number] Patient Account Number: [id number] Date  of Birth/Gender: 25-Apr-1946 (70 y.o. Male) Treating RN: Carolyne Fiscal, Debi Primary Care Physician: PATIENT, NO Other Clinician: Referring Physician: Hinda Kehr Treating Physician/Extender: Frann Rider in Treatment: 0 Education Assessment Education Provided To: Patient Education Topics Provided Wound/Skin Impairment: Handouts: Other: change dressing as ordered Methods: Demonstration, Explain/Verbal Responses: State content correctly Electronic Signature(s) Signed: 06/08/2016 4:41:03 PM By: Alric Quan Entered By: Alric Quan on 06/04/2016 14:44:41 Nathaniel Little, Nathaniel Little ([id number]) -------------------------------------------------------------------------------- Wound Assessment Details Patient Name: Nathaniel Little Date of Service: 06/04/2016 2:15 PM Medical Record Number: [id number] Patient Account Number: [id number] Date of Birth/Sex: [birth year] (70 y.o. Male) Treating RN: Carolyne Fiscal, Debi Primary Care Physician: PATIENT, NO Other Clinician: Referring Physician: Hinda Kehr Treating Physician/Extender: Frann Rider in Treatment: 0 Wound Status Wound Number: 1 Primary Trauma, Other Etiology: Wound Location: Left Lower Leg Secondary Lymphedema Wounding Event: Trauma Etiology: Date Acquired: 05/17/2016 Wound Open Weeks Of Treatment: 0 Status: Clustered Wound: No Comorbid Cataracts, Chronic Obstructive History: Pulmonary Disease (COPD), Hypertension, Osteoarthritis, Received Radiation Photos Photo Uploaded By: Alric Quan on 06/04/2016 14:28:25 Wound Measurements Length: (cm) 4 Width: (cm) 0.9 Depth: (cm) 0.3 Area: (cm) 2.827 Volume: (cm) 0.848 % Reduction in Area: 40% % Reduction in Volume: 40% Epithelialization: Small (1-33%) Tunneling: No Undermining: No Wound Description Full Thickness Without Exposed Classification: Support Structures Wound Margin: Flat and Intact Exudate Large Amount: Exudate Type: Serous Exudate Color:  amber Nathaniel Little, Nathaniel Little ([id number]) Wound Bed Granulation Amount: Small (1-33%) Exposed Structure Necrotic Amount: Large (67-100%) Fascia Exposed: No Necrotic Quality: Eschar, Adherent Slough Fat Layer Exposed: No Tendon Exposed: No Muscle Exposed: No Joint Exposed: No Bone Exposed: No Limited to Skin Breakdown Periwound Skin Texture Texture Color No Abnormalities Noted: No No Abnormalities Noted: No Callus: No Atrophie Blanche: No Crepitus: No Cyanosis: No Excoriation: No Ecchymosis: No Fluctuance: No Erythema: No Friable: No Hemosiderin Staining: No Induration: Yes Mottled: No Localized Edema: No Pallor: No Rash: No Rubor: No Scarring: Yes Temperature / Pain Moisture Temperature: No Abnormality No Abnormalities Noted: No Dry / Scaly: No Maceration: No Moist: No Wound Preparation Ulcer Cleansing: Rinsed/Irrigated with Saline Topical Anesthetic Applied: Other: liodocaine 4%, Treatment Notes Wound #1 (Left Lower Leg) 1. Cleansed with: Clean wound with Normal Saline 4. Dressing Applied: Medihoney Gel 5. Secondary Dressing Applied Dry [place] Signature(s) Signed: 06/08/2016 4:41:03 PM By: Alric Quan Entered By: Alric Quan on 06/04/2016 14:23:44 Nathaniel Little, Nathaniel Little ([id number]) Nathaniel Little, Nathaniel Little ([id number]) -------------------------------------------------------------------------------- Wound Assessment Details Patient Name: Nathaniel Little Date of Service: 06/04/2016 2:15 PM Medical Record Number: [id number] Patient Account Number: [id number] Date of Birth/Sex: Feb 18, 1946 (69 y.o. Male) Treating RN: Carolyne Fiscal, Debi Primary Care Physician: PATIENT, NO Other Clinician: Referring Physician: Hinda Kehr Treating Physician/Extender: Frann Rider in Treatment: 0 Wound Status Wound Number: 2 Primary To be determined Etiology: Wound Location: Lower Leg - Inferior Wound Open Wounding Event: Gradually Appeared Status: Date Acquired:  06/03/2016 Comorbid Cataracts, Chronic Obstructive Weeks Of Treatment: 0 History: Pulmonary Disease (COPD), Clustered Wound: No Hypertension, Osteoarthritis, Received Radiation Wound Measurements Length: (cm) 0.6 % Reduct Width: (cm) 0.6 % Reduct Depth: (cm) 0.1 Area: (cm) 0.283 Volume: (cm) 0.028 ion in Area: ion in Volume: Wound Description Classification: Partial Thickness Wound Margin: Distinct, outline attached Exudate Amount: Large Exudate Type: Serous Exudate Color: amber Foul Odor After Cleansing: No Wound Bed Granulation Amount: Small (1-33%) Exposed Structure Granulation Quality: Red Fascia Exposed: No Necrotic Amount: Large (  67-100%) Fat Layer Exposed: No Necrotic Quality: Adherent Slough Tendon Exposed: No Muscle Exposed: No Joint Exposed: No Bone Exposed: No Limited to Skin Breakdown Periwound Skin Texture Texture Color No Abnormalities Noted: No No Abnormalities Noted: No Moisture Temperature / Pain No Abnormalities Noted: No Temperature: No Abnormality [place], Nathaniel Little ([id number]) Moist: Yes Tenderness on Palpation: Yes Wound Preparation Ulcer Cleansing: Rinsed/Irrigated with Saline Topical Anesthetic Applied: Other: lidocaine 4%, Treatment Notes Wound #2 (Inferior Lower Leg) 1. Cleansed with: Clean wound with Normal Saline 4. Dressing Applied: Medihoney Gel 5. Secondary Dressing Applied Dry Nathaniel Little Signature(s) Signed: 06/08/2016 4:41:03 PM By: Alric Quan Entered By: Alric Quan on 06/04/2016 14:25:26 Nathaniel Little ([id number]) -------------------------------------------------------------------------------- Vitals Details Patient Name: Nathaniel Little Date of Service: 06/04/2016 2:15 PM Medical Record Number: [id number] Patient Account Number: [id number] Date of Birth/Sex: Dec 24, 1945 (69 y.o. Male) Treating RN: Carolyne Fiscal, Debi Primary Care Physician: PATIENT, NO Other Clinician: Referring Physician:  Hinda Kehr Treating Physician/Extender: Frann Rider in Treatment: 0 Vital Signs Time Taken: 14:15 Temperature (F): 97.5 Height (in): 69 Pulse (bpm): 73 Weight (lbs): 260 Respiratory Rate (breaths/min): 20 Body Mass Index (BMI): 38.4 Blood Pressure (mmHg): 135/72 Reference Range: 80 - 120 mg / dl Electronic Signature(s) Signed: 06/08/2016 4:41:03 PM By: Alric Quan Entered By: Alric Quan on 06/04/2016 14:17:55

## 2016-06-08 NOTE — Progress Notes (Signed)
KASEAN, DENHERDER ([id number]) Visit Report for 06/04/2016 Chief Complaint Document Details Patient Name: Nathaniel Little, Nathaniel Little Date of Service: 06/04/2016 2:15 PM Medical Record Number: [id number] Patient Account Number: [id number] Date of Birth/Sex: 19-Feb-1946 (70 y.o. Male) Treating RN: Carolyne Fiscal, Debi Primary Care Physician: PATIENT, NO Other Clinician: Referring Physician: Hinda Kehr Treating Physician/Extender: Frann Rider in Treatment: 0 Information Obtained from: Patient Chief Complaint Patient seen for complaints of Non-Healing Wound for his left lower extremity for 2 weeks Electronic Signature(s) Signed: 06/04/2016 2:38:07 PM By: Christin Fudge MD, FACS Entered By: Christin Fudge on 06/04/2016 14:38:07 Nathaniel Little ([id number]) -------------------------------------------------------------------------------- Debridement Details Patient Name: Nathaniel Little Date of Service: 06/04/2016 2:15 PM Medical Record Number: [id number] Patient Account Number: [id number] Date of Birth/Sex: [birth year] (69 y.o. Male) Treating RN: Carolyne Fiscal, Debi Primary Care Physician: PATIENT, NO Other Clinician: Referring Physician: Hinda Kehr Treating Physician/Extender: Frann Rider in Treatment: 0 Debridement Performed for Wound #1 Left Lower Leg Assessment: Performed By: Physician Christin Fudge, MD Debridement: Debridement Pre-procedure Yes - 14:34 Verification/Time Out Taken: Start Time: 14:35 Pain Control: Lidocaine 4% Topical Solution Level: Skin/Subcutaneous Tissue Total Area Debrided (L x 4 (cm) x 0.9 (cm) = 3.6 (cm) W): Tissue and other Viable, Non-Viable, Eschar, Fibrin/Slough, Subcutaneous material debrided: Instrument: Curette Bleeding: Minimum Hemostasis Achieved: Pressure End Time: 14:37 Procedural Pain: 0 Post Procedural Pain: 0 Response to Treatment: Procedure was tolerated well Post Debridement Measurements of Total Wound Length: (cm) 4 Width: (cm) 0.9 Depth: (cm)  0.3 Volume: (cm) 0.848 Character of Wound/Ulcer Post Requires Further Debridement Debridement: Severity of Tissue Post Debridement: Fat layer exposed Post Procedure Diagnosis Same as Pre-procedure Electronic Signature(s) Signed: 06/04/2016 2:37:55 PM By: Christin Fudge MD, FACS Signed: 06/08/2016 4:41:03 PM By: Alric Quan Entered By: Christin Fudge on 06/04/2016 14:37:55 Nathaniel Little, Nathaniel Little ([id number]) Nathaniel Little, Nathaniel Little ([id number]) -------------------------------------------------------------------------------- HPI Details Patient Name: Nathaniel Little Date of Service: 06/04/2016 2:15 PM Medical Record Number: [id number] Patient Account Number: [id number] Date of Birth/Sex: [birth year] (70 y.o. Male) Treating RN: Carolyne Fiscal, Debi Primary Care Physician: PATIENT, NO Other Clinician: Referring Physician: Hinda Kehr Treating Physician/Extender: Frann Rider in Treatment: 0 History of Present Illness Location: left lower extremity Quality: Patient reports experiencing a sharp pain to affected area(s). Severity: Patient states wound are getting worse. Duration: Patient has had the wound for < 2 weeks prior to presenting for treatment Timing: Pain in wound is constant (hurts all the time) Context: The wound occurred when the patient a piece of plywood lacerated wound on his left lower extremity on the shin Modifying Factors: Consults to this date include:Keflex and Bactrim Associated Signs and Symptoms: Patient reports having increase swelling. HPI Description: 70 year old gentleman who presented to the ER a week ago with a history of having injury to his left leg with a piece of plywood which lacerated his leg. Past medical system history significant for COPD and hypertension.he is a former smoker who quit smoking recently. the ER he was found to have a vertical laceration on the left lower extremity which is foul-smelling and had no abscess or discharge.an x-ray was done which was  unremarkable. report reviewed -- Mild soft tissue edema is noted consistent with the given clinical history. No definitive radiopaque foreign body is seen. No bony abnormality is noted. He was started on Keflex 500 mg twice daily and Bactrim DS 1 tablet twice daily. He was seen back in the ER on 05/26/2016 and was found to have good progression of healing and no generalized cellulitis and did not warrant inpatient admission. 06/04/2016 --  the patient has not followed any of her instructions he saw him last week and has been doing dry dressings of his wound only Electronic Signature(s) Signed: 06/04/2016 2:38:46 PM By: Christin Fudge MD, FACS Entered By: Christin Fudge on 06/04/2016 14:38:46 Nathaniel Little ([id number]) -------------------------------------------------------------------------------- Physical Exam Details Patient Name: Nathaniel Little Date of Service: 06/04/2016 2:15 PM Medical Record Number: [id number] Patient Account Number: [id number] Date of Birth/Sex: 10/05/1945 (70 y.o. Male) Treating RN: Carolyne Fiscal, Debi Primary Care Physician: PATIENT, NO Other Clinician: Referring Physician: Hinda Kehr Treating Physician/Extender: Frann Rider in Treatment: 0 Constitutional . Pulse regular. Respirations normal and unlabored. Afebrile. . Eyes Nonicteric. Reactive to light. Ears, Nose, Mouth, and Throat Lips, teeth, and gums WNL.[name] Moist mucosa without lesions. Neck supple and nontender. No palpable supraclavicular or cervical adenopathy. Normal sized without goiter. Respiratory WNL. No retractions.. Breath sounds WNL, No rubs, rales, rhonchi, or wheeze.. Cardiovascular Heart rhythm and rate regular, no murmur or gallop.. Pedal Pulses WNL. No clubbing, cyanosis or edema. Chest Breasts symmetical and no nipple discharge.. Breast tissue WNL, no masses, lumps, or tenderness.. Gastrointestinal (GI) Abdomen without masses or tenderness.. No liver or spleen enlargement or  tenderness.. Genitourinary (GU) No hydrocele, spermatocele, tenderness of the cord, or testicular mass.[name] Penis without lesions.Nathaniel Little without lesions. No cystocele, or rectocele. Pelvic support intact, no discharge.[name] Urethra without masses, tenderness or scarring.[name] Lymphatic No adneopathy. No adenopathy. No adenopathy. Musculoskeletal Adexa without tenderness or enlargement.. Digits and nails w/o clubbing, cyanosis, infection, petechiae, ischemia, or inflammatory conditions.. Integumentary (Hair, Skin) No suspicious lesions. No crepitus or fluctuance. No peri-wound warmth or erythema. No masses.[name] Psychiatric Judgement and insight Intact.. No evidence of depression, anxiety, or agitation.. Notes the wound on his left mention area continues to have significant amount of debris and I have used a #3 curet and sharply debrided this. Electronic Signature(s) FREDERIK, STANDLEY ([id number]) Signed: 06/04/2016 2:39:13 PM By: Christin Fudge MD, FACS Entered By: Christin Fudge on 06/04/2016 14:39:13 Nathaniel Little, Nathaniel Little ([id number]) -------------------------------------------------------------------------------- Physician Orders Details Patient Name: Nathaniel Little Date of Service: 06/04/2016 2:15 PM Medical Record Number: [id number] Patient Account Number: [id number] Date of Birth/Sex: [birth year] (69 y.o. Male) Treating RN: Montey Hora Primary Care Physician: PATIENT, NO Other Clinician: Referring Physician: Hinda Kehr Treating Physician/Extender: Frann Rider in Treatment: 0 Verbal / Phone Orders: Yes Clinician: Montey Hora Read Back and Verified: Yes Diagnosis Coding Wound Cleansing Wound #1 Left Lower Leg o Clean wound with Normal Saline. o May Shower, gently pat wound dry prior to applying new dressing. Wound #2 Inferior Lower Leg o Clean wound with Normal Saline. o May Shower, gently pat wound dry prior to applying new dressing. Anesthetic Wound #1 Left Lower Leg o  Topical Lidocaine 4% cream applied to wound bed prior to debridement Wound #2 Inferior Lower Leg o Topical Lidocaine 4% cream applied to wound bed prior to debridement Primary Wound Dressing Wound #1 Left Lower Leg o Medihoney gel - Manuka Honey over the counter Wound #2 Inferior Lower Leg o Medihoney gel - Manuka Honey over the counter Secondary Dressing Wound #1 Left Lower Leg o Boardered Foam Dressing - [place] Wound #2 Inferior Lower Leg o Boardered Foam Dressing - [place] Dressing Change Frequency Wound #1 Left Lower Leg o Change dressing every day. Wound #2 Inferior Lower Leg o Change dressing every day. Nathaniel Little, Nathaniel Little ([id number]) Follow-up Appointments Wound #1 Left Lower Leg o Other: - Friday Wound #2 Inferior Lower Leg o Other: - Friday Edema Control Wound #1 Left Lower Leg o  Patient to wear own compression stockings o Elevate legs to the level of the heart and pump ankles as often as possible Wound #2 Inferior Lower Leg o Patient to wear own compression stockings o Elevate legs to the level of the heart and pump ankles as often as possible Medications-please add to medication list. Wound #1 Left Lower Leg o Other: - Medihoney Wound #2 Inferior Lower Leg o Other: - Medihoney Electronic Signature(s) Signed: 06/04/2016 3:52:23 PM By: Christin Fudge MD, FACS Signed: 06/04/2016 4:27:47 PM By: Montey Hora Entered By: Montey Hora on 06/04/2016 14:37:42 Nathaniel Little, Nathaniel Little ([id number]) -------------------------------------------------------------------------------- Problem List Details Patient Name: Nathaniel Little Date of Service: 06/04/2016 2:15 PM Medical Record Number: [id number] Patient Account Number: [id number] Date of Birth/Sex: 1946/01/22 (70 y.o. Male) Treating RN: Carolyne Fiscal, Debi Primary Care Physician: PATIENT, NO Other Clinician: Referring Physician: Hinda Kehr Treating Physician/Extender: Frann Rider in  Treatment: 0 Active Problems ICD-10 Encounter Code Description Active Date Diagnosis S81.812A Laceration without foreign body, left lower leg, initial 05/31/2016 Yes encounter L97.222 Non-pressure chronic ulcer of left calf with fat layer 05/31/2016 Yes exposed I89.0 Lymphedema, not elsewhere classified 05/31/2016 Yes Z87.891 Personal history of nicotine dependence 05/31/2016 Yes E66.01 Morbid (severe) obesity due to excess calories 05/31/2016 Yes Inactive Problems Resolved Problems Electronic Signature(s) Signed: 06/04/2016 2:37:40 PM By: Christin Fudge MD, FACS Entered By: Christin Fudge on 06/04/2016 14:37:39 Nathaniel Little ([id number]) -------------------------------------------------------------------------------- Progress Note Details Patient Name: Nathaniel Little Date of Service: 06/04/2016 2:15 PM Medical Record Number: [id number] Patient Account Number: [id number] Date of Birth/Sex: December 20, 1945 (70 y.o. Male) Treating RN: Carolyne Fiscal, Debi Primary Care Physician: PATIENT, NO Other Clinician: Referring Physician: Hinda Kehr Treating Physician/Extender: Frann Rider in Treatment: 0 Subjective Chief Complaint Information obtained from Patient Patient seen for complaints of Non-Healing Wound for his left lower extremity for 2 weeks History of Present Illness (HPI) The following HPI elements were documented for the patient's wound: Location: left lower extremity Quality: Patient reports experiencing a sharp pain to affected area(s). Severity: Patient states wound are getting worse. Duration: Patient has had the wound for < 2 weeks prior to presenting for treatment Timing: Pain in wound is constant (hurts all the time) Context: The wound occurred when the patient a piece of plywood lacerated wound on his left lower extremity on the shin Modifying Factors: Consults to this date include:Keflex and Bactrim Associated Signs and Symptoms: Patient reports having increase  swelling. 70 year old gentleman who presented to the ER a week ago with a history of having injury to his left leg with a piece of plywood which lacerated his leg. Past medical system history significant for COPD and hypertension.he is a former smoker who quit smoking recently. the ER he was found to have a vertical laceration on the left lower extremity which is foul-smelling and had no abscess or discharge.an x-ray was done which was unremarkable. report reviewed -- Mild soft tissue edema is noted consistent with the given clinical history. No definitive radiopaque foreign body is seen. No bony abnormality is noted. He was started on Keflex 500 mg twice daily and Bactrim DS 1 tablet twice daily. He was seen back in the ER on 05/26/2016 and was found to have good progression of healing and no generalized cellulitis and did not warrant inpatient admission. 06/04/2016 -- the patient has not followed any of her instructions he saw him last week and has been doing dry dressings of his wound only Objective Constitutional Nathaniel Little, Nathaniel Little ([id number]) Pulse regular. Respirations normal and unlabored. Afebrile. Vitals Time  Taken: 2:15 PM, Height: 69 in, Weight: 260 lbs, BMI: 38.4, Temperature: 97.5 F, Pulse: 73 bpm, Respiratory Rate: 20 breaths/min, Blood Pressure: 135/72 mmHg. Eyes Nonicteric. Reactive to light. Ears, Nose, Mouth, and Throat Lips, teeth, and gums WNL.[name] Moist mucosa without lesions. Neck supple and nontender. No palpable supraclavicular or cervical adenopathy. Normal sized without goiter. Respiratory WNL. No retractions.. Breath sounds WNL, No rubs, rales, rhonchi, or wheeze.. Cardiovascular Heart rhythm and rate regular, no murmur or gallop.. Pedal Pulses WNL. No clubbing, cyanosis or edema. Chest Breasts symmetical and no nipple discharge.. Breast tissue WNL, no masses, lumps, or tenderness.. Gastrointestinal (GI) Abdomen without masses or tenderness.. No liver or spleen  enlargement or tenderness.. Genitourinary (GU) No hydrocele, spermatocele, tenderness of the cord, or testicular mass.[name] Penis without lesions.Nathaniel Little without lesions. No cystocele, or rectocele. Pelvic support intact, no discharge.[name] Urethra without masses, tenderness or scarring.[name] Lymphatic No adneopathy. No adenopathy. No adenopathy. Musculoskeletal Adexa without tenderness or enlargement.. Digits and nails w/o clubbing, cyanosis, infection, petechiae, ischemia, or inflammatory conditions.[name] Psychiatric Judgement and insight Intact.. No evidence of depression, anxiety, or agitation.. General Notes: the wound on his left mention area continues to have significant amount of debris and I have used a #3 curet and sharply debrided this. Integumentary (Hair, Skin) No suspicious lesions. No crepitus or fluctuance. No peri-wound warmth or erythema. No masses.. Wound #1 status is Open. Original cause of wound was Trauma. The wound is located on the Left Lower Leg. The wound measures 4cm length x 0.9cm width x 0.3cm depth; 2.827cm^2 area and 0.848cm^3 Nathaniel Little, Nathaniel Little ([id number]) volume. The wound is limited to skin breakdown. There is no tunneling or undermining noted. There is a large amount of serous drainage noted. The wound margin is flat and intact. There is small (1-33%) granulation within the wound bed. There is a large (67-100%) amount of necrotic tissue within the wound bed including Eschar and Adherent Slough. The periwound skin appearance exhibited: Induration, Scarring. The periwound skin appearance did not exhibit: Callus, Crepitus, Excoriation, Fluctuance, Friable, Localized Edema, Rash, Dry/Scaly, Maceration, Moist, Atrophie Blanche, Cyanosis, Ecchymosis, Hemosiderin Staining, Mottled, Pallor, Rubor, Erythema. Periwound temperature was noted as No Abnormality. Wound #2 status is Open. Original cause of wound was Gradually Appeared. The wound is located on the Inferior Lower Leg. The  wound measures 0.6cm length x 0.6cm width x 0.1cm depth; 0.283cm^2 area and 0.028cm^3 volume. The wound is limited to skin breakdown. There is a large amount of serous drainage noted. The wound margin is distinct with the outline attached to the wound base. There is small (1-33%) red granulation within the wound bed. There is a large (67-100%) amount of necrotic tissue within the wound bed including Adherent Slough. The periwound skin appearance exhibited: Moist. Periwound temperature was noted as No Abnormality. The periwound has tenderness on palpation. Assessment Active Problems ICD-10 S81.812A - Laceration without foreign body, left lower leg, initial encounter L97.222 - Non-pressure chronic ulcer of left calf with fat layer exposed I89.0 - Lymphedema, not elsewhere classified Z87.891 - Personal history of nicotine dependence E66.01 - Morbid (severe) obesity due to excess calories Procedures Wound #1 Wound #1 is a Trauma, Other located on the Left Lower Leg . There was a Skin/Subcutaneous Tissue Debridement ([id number]) debridement with total area of 3.6 sq cm performed by Christin Fudge, MD. with the following instrument(s): Curette to remove Viable and Non-Viable tissue/material including Fibrin/Slough, Eschar, and Subcutaneous after achieving pain control using Lidocaine 4% Topical Solution. A time out was conducted at 14:34,  prior to the start of the procedure. A Minimum amount of bleeding was controlled with Pressure. The procedure was tolerated well with a pain level of 0 throughout and a pain level of 0 following the procedure. Post Debridement Measurements: 4cm length x 0.9cm width x 0.3cm depth; 0.848cm^3 volume. Character of Wound/Ulcer Post Debridement requires further debridement. Severity of Tissue Post Debridement is: Fat layer exposed. Post procedure Diagnosis Wound #1: Same as Pre-Procedure Nathaniel Little, Nathaniel Little ([id number]) Plan Wound Cleansing: Wound #1 Left Lower  Leg: Clean wound with Normal Saline. May Shower, gently pat wound dry prior to applying new dressing. Wound #2 Inferior Lower Leg: Clean wound with Normal Saline. May Shower, gently pat wound dry prior to applying new dressing. Anesthetic: Wound #1 Left Lower Leg: Topical Lidocaine 4% cream applied to wound bed prior to debridement Wound #2 Inferior Lower Leg: Topical Lidocaine 4% cream applied to wound bed prior to debridement Primary Wound Dressing: Wound #1 Left Lower Leg: Medihoney gel - Manuka Honey over the counter Wound #2 Inferior Lower Leg: Medihoney gel - Manuka Honey over the counter Secondary Dressing: Wound #1 Left Lower Leg: [place] Wound #2 Inferior Lower Leg: Boardered Foam [place] Dressing Change Frequency: Wound #1 Left Lower Leg: Change dressing every day. Wound #2 Inferior Lower Leg: Change dressing every day. Follow-up Appointments: Wound #1 Left Lower Leg: Other: - Friday Wound #2 Inferior Lower Leg: Other: - Friday Edema Control: Wound #1 Left Lower Leg: Patient to wear own compression stockings Elevate legs to the level of the heart and pump ankles as often as possible Wound #2 Inferior Lower Leg: Patient to wear own compression stockings Elevate legs to the level of the heart and pump ankles as often as possible Medications-please add to medication list.: Wound #1 Left Lower Leg: Other: Nathaniel Little, Nathaniel Little ([id number]) Wound #2 Inferior Lower Leg: Other: - Medihoney patient has not followed any of our instructions from last week and I have reiterated to him that it is very important to be compliant with our plan. After reiew I have recommended: 1. wash the wound daily with soap and water. 2. use medihoney daily and cover with a bordered foam 3. elevation and exercise 4. Compression stockings of the 20-30 mm variety to be applied daily and details of this discussed with him. Electronic  Signature(s) Signed: 06/04/2016 2:40:20 PM By: Christin Fudge MD, FACS Entered By: Christin Fudge on 06/04/2016 14:40:20 Nathaniel Little ([id number]) -------------------------------------------------------------------------------- SuperBill Details Patient Name: Nathaniel Little Date of Service: 06/04/2016 Medical Record Number: [id number] Patient Account Number: [id number] Date of Birth/Sex: August 27, 1946 (69 y.o. Male) Treating RN: Carolyne Fiscal, Debi Primary Care Physician: PATIENT, NO Other Clinician: Referring Physician: Hinda Kehr Treating Physician/Extender: Frann Rider in Treatment: 0 Diagnosis Coding ICD-10 Codes Code Description [phone] Laceration without foreign body, left lower leg, initial encounter L97.222 Non-pressure chronic ulcer of left calf with fat layer exposed I89.0 Lymphedema, not elsewhere classified Z87.891 Personal history of nicotine dependence E66.01 Morbid (severe) obesity due to excess calories Facility Procedures CPT4 Code Description: [id number] 11042 - DEB SUBQ TISSUE 20 SQ CM/< ICD-10 Description Diagnosis S81.812A Laceration without foreign body, left lower leg, ini L97.222 Non-pressure chronic ulcer of left calf with fat lay I89.0 Lymphedema, not elsewhere  classified Modifier: tial encounter er exposed Quantity: 1 Physician Procedures CPT4 Code Description: [id number] 11042 - WC PHYS SUBQ TISS 20 SQ CM ICD-10 Description Diagnosis S81.812A Laceration without foreign body, left lower leg, ini L97.222 Non-pressure chronic ulcer of  left calf with fat lay I89.0 Lymphedema, not elsewhere  classified Modifier: tial encounter er exposed Quantity: 1 Electronic Signature(s) Signed: 06/04/2016 2:40:31 PM By: Christin Fudge MD, FACS Entered By: Christin Fudge on 06/04/2016 14:40:31

## 2016-06-14 ENCOUNTER — Encounter: Payer: Commercial Managed Care - HMO | Admitting: Surgery

## 2016-06-14 ENCOUNTER — Encounter: Payer: Self-pay | Admitting: *Deleted

## 2016-06-14 ENCOUNTER — Emergency Department: Payer: Commercial Managed Care - HMO

## 2016-06-14 ENCOUNTER — Emergency Department
Admission: EM | Admit: 2016-06-14 | Discharge: 2016-06-14 | Disposition: A | Discharge status: Home / Self Care | Payer: Commercial Managed Care - HMO | Attending: Student | Admitting: Student

## 2016-06-14 DIAGNOSIS — R0602 Shortness of breath: Secondary | ICD-10-CM | POA: Diagnosis present

## 2016-06-14 DIAGNOSIS — J441 Chronic obstructive pulmonary disease with (acute) exacerbation: Secondary | ICD-10-CM | POA: Diagnosis not present

## 2016-06-14 DIAGNOSIS — I10 Essential (primary) hypertension: Secondary | ICD-10-CM | POA: Insufficient documentation

## 2016-06-14 DIAGNOSIS — S81812A Laceration without foreign body, left lower leg, initial encounter: Secondary | ICD-10-CM | POA: Diagnosis not present

## 2016-06-14 DIAGNOSIS — Z87891 Personal history of nicotine dependence: Secondary | ICD-10-CM | POA: Diagnosis not present

## 2016-06-14 LAB — CBC
HCT: 45.5 % (ref 40.0–52.0)
HEMOGLOBIN: 16 g/dL (ref 13.0–18.0)
MCH: 32.9 pg (ref 26.0–34.0)
MCHC: 35.2 g/dL (ref 32.0–36.0)
MCV: 93.6 fL (ref 80.0–100.0)
PLATELETS: 198 10*3/uL (ref 150–440)
RBC: 4.87 MIL/uL (ref 4.40–5.90)
RDW: 13.1 % (ref 11.5–14.5)
WBC: 11.4 10*3/uL — AB (ref 3.8–10.6)

## 2016-06-14 LAB — BASIC METABOLIC PANEL
ANION GAP: 9 (ref 5–15)
BUN: 26 mg/dL — ABNORMAL HIGH (ref 6–20)
CALCIUM: 9.5 mg/dL (ref 8.9–10.3)
CHLORIDE: 103 mmol/L (ref 101–111)
CO2: 24 mmol/L (ref 22–32)
CREATININE: 1.01 mg/dL (ref 0.61–1.24)
GFR calc Af Amer: >60 mL/min (ref >60)
GFR calc non Af Amer: >60 mL/min (ref >60)
Glucose, Bld: 97 mg/dL (ref 65–99)
Potassium: 3.7 mmol/L (ref 3.5–5.1)
Sodium: 136 mmol/L (ref 135–145)

## 2016-06-14 LAB — HEPATIC FUNCTION PANEL
ALK PHOS: 72 U/L (ref 38–126)
ALT: 34 U/L (ref 17–63)
AST: 32 U/L (ref 15–41)
Albumin: 4.1 g/dL (ref 3.5–5.0)
BILIRUBIN INDIRECT: 0.2 mg/dL — AB (ref 0.3–0.9)
Bilirubin, Direct: 0.1 mg/dL (ref 0.1–0.5)
TOTAL PROTEIN: 7.2 g/dL (ref 6.5–8.1)
Total Bilirubin: 0.3 mg/dL (ref 0.3–1.2)

## 2016-06-14 LAB — BRAIN NATRIURETIC PEPTIDE: B Natriuretic Peptide: 27 pg/mL (ref 0.0–100.0)

## 2016-06-14 LAB — TROPONIN I
TROPONIN I: 0.03 ng/mL — AB (ref <0.03)
Troponin I: 0.03 ng/mL (ref <0.03)

## 2016-06-14 MED ORDER — PREDNISONE 20 MG PO TABS: 60.0000 mg | ORAL_TABLET | Freq: Every day | ORAL | 0 refills | Status: AC

## 2016-06-14 MED ORDER — IPRATROPIUM-ALBUTEROL 0.5-2.5 (3) MG/3ML IN SOLN
3.0000 mL | Freq: Once | RESPIRATORY_TRACT | Status: AC
  Administered 2016-06-14: 3 mL via RESPIRATORY_TRACT
  Filled 2016-06-14: qty 3

## 2016-06-14 MED ORDER — IPRATROPIUM BROMIDE 0.02 % IN SOLN: 0.5000 mg | Freq: Four times a day (QID) | RESPIRATORY_TRACT | 0 refills | Status: DC | PRN

## 2016-06-14 MED ORDER — METHYLPREDNISOLONE SODIUM SUCC 125 MG IJ SOLR
125.0000 mg | Freq: Once | INTRAMUSCULAR | Status: AC
  Administered 2016-06-14: 125 mg via INTRAVENOUS
  Filled 2016-06-14: qty 2

## 2016-06-14 MED ORDER — ALBUTEROL SULFATE HFA 108 (90 BASE) MCG/ACT IN AERS: 2.0000 | INHALATION_SPRAY | Freq: Four times a day (QID) | RESPIRATORY_TRACT | 0 refills | Status: DC | PRN

## 2016-06-14 MED ORDER — AZITHROMYCIN 250 MG PO TABS: ORAL_TABLET | ORAL | 0 refills | Status: AC

## 2016-06-14 NOTE — ED Provider Notes (Signed)
[facility] Emergency Department Provider Note   ____________________________________________   First MD Initiated Contact with Patient 06/14/16 1632     (approximate)  I have reviewed the triage vital signs and the nursing notes.   HISTORY  Chief Complaint Shortness of Breath    HPI Nathaniel Little is a 70 y.o. male history of COPD, hypertension and heart murmur who presents for evaluation one week of shortness of breath with wheezing, gradual onset, constant, worse when he lies down as well as with exertion, currently moderate, improves briefly when he uses his nebulizer treatments at home but then returns. No chest pain. No abdominal pain, vomiting or diarrhea. He has had cough productive of clear mucus. No fevers.    Past Medical History:  Diagnosis Date  . COPD (chronic obstructive pulmonary disease) ([place])   . Hypertension     There are no active problems to display for this patient.   History reviewed. No pertinent surgical history.  Prior to Admission medications   Medication Sig Start Date End Date Taking? Authorizing Provider  albuterol (PROVENTIL) (2.5 MG/3ML) 0.083% nebulizer solution Take 2.5 mg by nebulization every 6 (six) hours as needed for wheezing or shortness of breath.    Historical Provider, MD  aspirin 81 MG tablet Take 81 mg by mouth daily.    Historical Provider, MD  cephALEXin (KEFLEX) 500 MG capsule Take 1 capsule (500 mg total) by mouth 2 (two) times daily. 05/23/16   Lavonia Drafts, MD  escitalopram (LEXAPRO) 10 MG tablet Take 10 mg by mouth daily. Reported on 04/14/2016    Historical Provider, MD  losartan (COZAAR) 50 MG tablet Take 50 mg by mouth daily.    Historical Provider, MD  sulfamethoxazole-trimethoprim (BACTRIM DS,SEPTRA DS) 800-160 MG tablet Take 1 tablet by mouth 2 (two) times daily. 05/23/16   Lavonia Drafts, MD    Allergies Codeine  History reviewed. No pertinent family history.  Social History Social History   Substance Use Topics  . Smoking status: Former Smoker    Years: 68.00    Types: Cigarettes  . Smokeless tobacco: Former Systems developer    Quit date: 05/16/2016  . Alcohol use Not on file    Review of Systems Constitutional: No fever/chills Eyes: No visual changes. ENT: No sore throat. Cardiovascular: Denies chest pain. Respiratory: + shortness of breath. Gastrointestinal: No abdominal pain.  No nausea, no vomiting.  No diarrhea.  No constipation. Genitourinary: Negative for dysuria. Musculoskeletal: Negative for back pain. Skin: Negative for rash. Neurological: Negative for headaches, focal weakness or numbness.  10-point ROS otherwise negative.  ____________________________________________   PHYSICAL EXAM:  Vitals:   06/14/16 1641 06/14/16 1730 06/14/16 1820 06/14/16 1830  BP: (!) 151/87 (!) 159/83 (!) 153/78 (!) 155/75  Pulse: 77 78 79 80  Resp: (!) 24 (!) 25 (!) 24 (!) 23  Temp:      TempSrc:      SpO2: 96% 94% 95% 94%  Weight:      Height:        VITAL SIGNS: ED Triage Vitals [06/14/16 1420]  Enc Vitals Group     BP (!) 143/70     Pulse Rate 85     Resp 18     Temp 98 F (36.7 C)     Temp Source Oral     SpO2 96 %     Weight 235 lb (106.6 kg)     Height '5\' 11"'$  (1.803 m)     Head Circumference  Peak Flow      Pain Score 0     Pain Loc      Pain Edu?      Excl. in [place]?     Constitutional: Alert and oriented. Nontoxic-appearing and in no acute distress, frequent cough. Eyes: Conjunctivae are normal. PERRL. EOMI. Head: Atraumatic. Nose: No congestion/rhinnorhea. Mouth/Throat: Mucous membranes are moist.  Oropharynx non-erythematous. Neck: No stridor. Supple without meningismus. Cardiovascular: Normal rate, regular rhythm. Grossly normal heart sounds.  Good peripheral circulation. Respiratory: Mildly tachypneic without increased work of breathing, diffuse expiratory wheeze with prolonged expiratory phase, poor air movement bilaterally. Gastrointestinal:  Soft and nontender. No distention. No CVA tenderness. Genitourinary: deferred Musculoskeletal: No lower extremity tenderness nor edema.  No joint effusions.No calf swelling/asymmetry/tenderness. Neurologic:  Normal speech and language. No gross focal neurologic deficits are appreciated. No gait instability. Skin:  Skin is warm, dry and intact. No rash noted. Psychiatric: Mood and affect are normal. Speech and behavior are normal.  ____________________________________________   LABS (all labs ordered are listed, but only abnormal results are displayed)  Labs Reviewed  BASIC METABOLIC PANEL - Abnormal; Notable for the following:       Result Value   BUN 26 (*)    All other components within normal limits  CBC - Abnormal; Notable for the following:    WBC 11.4 (*)    All other components within normal limits  HEPATIC FUNCTION PANEL - Abnormal; Notable for the following:    Indirect Bilirubin 0.2 (*)    All other components within normal limits  TROPONIN I - Abnormal; Notable for the following:    Troponin I 0.03 (*)    All other components within normal limits  TROPONIN I - Abnormal; Notable for the following:    Troponin I 0.03 (*)    All other components within normal limits  BRAIN NATRIURETIC PEPTIDE   ____________________________________________  EKG  ED ECG REPORT I, Joanne Gavel, the attending physician, personally viewed and interpreted this ECG.   Date: 06/14/2016  EKG Time: 14:27  Rate: 82  Rhythm: normal sinus rhythm  Axis: left axis deviation  Intervals:none  ST&T Change: No acute ST elevation or acute ST depression. Q waves in lead 3 and aVF.  ____________________________________________  RADIOLOGY  CXR IMPRESSION:  COPD. Possible superimposed acute bronchitic change especially at  the lung bases. No alveolar pneumonia nor CHF.    Aortic atherosclerosis.    ____________________________________________   PROCEDURES  Procedure(s) performed:  None  Procedures  Critical Care performed: No  ____________________________________________   INITIAL IMPRESSION / ASSESSMENT AND PLAN / ED COURSE  Pertinent labs & imaging results that were available during my care of the patient were reviewed by me and considered in my medical decision making (see chart for details).  Nathaniel Little is a 70 y.o. male history of COPD, hypertension and heart murmur who presents for evaluation one week of shortness of breath with wheezing. On exam, he is nontoxic appearing and in no acute distress but he does have mild tachypnea. His vital signs are otherwise stable and he is afebrile. He does have poor air movement with diffuse expiratory wheeze concerning for acute COPD exacerbation. EKG not consistent with acute ischemia. We'll obtain screening cardiac labs, x-ray and treat symptomatically with DuoNeb as well as Solu-Medrol. Reassess for disposition.  ----------------------------------------- 6:48 PM on 06/14/2016 ----------------------------------------- Patient reports he feels much better at this time. Sitting up in bed, eating peanut butter crackers. I reexamined him. He has good air  movement with diminished wheezes. Ambulatory sat is normal, he is not requiring any additional oxygen. Still mildly tachypneic at times but able to speak in complete sentences. 2 sets of troponins are 0.03, not consistent with acute ischemia/ACS. CBC is notable for mild leukocytosis, unremarkable CMP and BNP. Chest x-ray shows COPD. Clinical picture is not consistent with PE or acute aortic dissection. We discussed return precautions and he is comfortable with the discharge plan. DC home.  Clinical Course     ____________________________________________   FINAL CLINICAL IMPRESSION(S) / ED DIAGNOSES  Final diagnoses:  Shortness of breath  COPD with acute exacerbation ([place])      NEW MEDICATIONS STARTED DURING THIS VISIT:  New Prescriptions   No medications on  file     Note:  This document was prepared using Dragon voice recognition software and may include unintentional dictation errors.    Joanne Gavel, MD 06/14/16 [phone]

## 2016-06-14 NOTE — Progress Notes (Signed)
JERYN, CERNEY ([id number]) Visit Report for 06/14/2016 Arrival Information Details Patient Name: Nathaniel Little, Nathaniel Little Date of Service: 06/14/2016 1:30 PM Medical Record Number: [id number] Patient Account Number: [id number] Date of Birth/Sex: 1946/01/07 (70 y.o. Male) Treating RN: Cornell Barman Primary Care Physician: PATIENT, NO Other Clinician: Referring Physician: Hinda Kehr Treating Physician/Extender: Frann Rider in Treatment: 2 Visit Information History Since Last Visit Added or deleted any medications: No Patient Arrived: Cane Any new allergies or adverse reactions: No Arrival Time: 13:20 Had a fall or experienced change in No Accompanied By: self activities of daily living that may affect Transfer Assistance: None risk of falls: Patient Identification Verified: Yes Signs or symptoms of abuse/neglect since last No Secondary Verification Process Yes visito Completed: Hospitalized since last visit: No Patient Has Alerts: Yes Has Dressing in Place as Prescribed: Yes Patient Alerts: Patient on Blood Pain Present Now: No Thinner Aspirin 81 mg NO DM Electronic Signature(s) Signed: 06/14/2016 3:05:53 PM By: Gretta Cool, RN, BSN, Kim RN, BSN Entered By: Gretta Cool, RN, BSN, Kim on 06/14/2016 13:24:00 Nathaniel Little ([id number]) -------------------------------------------------------------------------------- Clinic Level of Care Assessment Details Patient Name: Nathaniel Little Date of Service: 06/14/2016 1:30 PM Medical Record Number: [id number] Patient Account Number: [id number] Date of Birth/Sex: 01-05-1946 (70 y.o. Male) Treating RN: Cornell Barman Primary Care Physician: PATIENT, NO Other Clinician: Referring Physician: Hinda Kehr Treating Physician/Extender: Frann Rider in Treatment: 2 Clinic Level of Care Assessment Items TOOL 4 Quantity Score '[]'$  - Use when only an EandM is performed on FOLLOW-UP visit 0 ASSESSMENTS - Nursing Assessment / Reassessment X - Reassessment of  Co-morbidities (includes updates in patient status) 1 10 X - Reassessment of Adherence to Treatment Plan 1 5 ASSESSMENTS - Wound and Skin Assessment / Reassessment X - Simple Wound Assessment / Reassessment - one wound 1 5 '[]'$  - Complex Wound Assessment / Reassessment - multiple wounds 0 '[]'$  - Dermatologic / Skin Assessment (not related to wound area) 0 ASSESSMENTS - Focused Assessment '[]'$  - Circumferential Edema Measurements - multi extremities 0 '[]'$  - Nutritional Assessment / Counseling / Intervention 0 '[]'$  - Lower Extremity Assessment (monofilament, tuning fork, pulses) 0 '[]'$  - Peripheral Arterial Disease Assessment (using hand held doppler) 0 ASSESSMENTS - Ostomy and/or Continence Assessment and Care '[]'$  - Incontinence Assessment and Management 0 '[]'$  - Ostomy Care Assessment and Management (repouching, etc.) 0 PROCESS - Coordination of Care X - Simple Patient / Family Education for ongoing care 1 15 '[]'$  - Complex (extensive) Patient / Family Education for ongoing care 0 X - Staff obtains Programmer, systems, Records, Test Results / Process Orders 1 10 '[]'$  - Staff telephones HHA, Nursing Homes / Clarify orders / etc 0 '[]'$  - Routine Transfer to another Facility (non-emergent condition) 0 Lehtinen, Tallon ([id number]) '[]'$  - Routine Hospital Admission (non-emergent condition) 0 '[]'$  - New Admissions / Biomedical engineer / Ordering NPWT, Apligraf, etc. 0 '[]'$  - Emergency Hospital Admission (emergent condition) 0 X - Simple Discharge Coordination 1 10 '[]'$  - Complex (extensive) Discharge Coordination 0 PROCESS - Special Needs '[]'$  - Pediatric / Minor Patient Management 0 '[]'$  - Isolation Patient Management 0 '[]'$  - Hearing / Language / Visual special needs 0 '[]'$  - Assessment of Community assistance (transportation, D/C planning, etc.) 0 '[]'$  - Additional assistance / Altered mentation 0 '[]'$  - Support Surface(s) Assessment (bed, cushion, seat, etc.) 0 INTERVENTIONS - Wound Cleansing / Measurement X - Simple Wound  Cleansing - one wound 1 5 '[]'$  - Complex Wound Cleansing - multiple wounds 0 X - Wound Imaging (photographs - any  number of wounds) 1 5 '[]'$  - Wound Tracing (instead of photographs) 0 X - Simple Wound Measurement - one wound 1 5 '[]'$  - Complex Wound Measurement - multiple wounds 0 INTERVENTIONS - Wound Dressings X - Small Wound Dressing one or multiple wounds 1 10 '[]'$  - Medium Wound Dressing one or multiple wounds 0 '[]'$  - Large Wound Dressing one or multiple wounds 0 '[]'$  - Application of Medications - topical 0 '[]'$  - Application of Medications - injection 0 INTERVENTIONS - Miscellaneous '[]'$  - External ear exam 0 Salmi, Arnell ([id number]) '[]'$  - Specimen Collection (cultures, biopsies, blood, body fluids, etc.) 0 '[]'$  - Specimen(s) / Culture(s) sent or taken to Lab for analysis 0 '[]'$  - Patient Transfer (multiple staff / Harrel Lemon Lift / Similar devices) 0 '[]'$  - Simple Staple / Suture removal (25 or less) 0 '[]'$  - Complex Staple / Suture removal (26 or more) 0 '[]'$  - Hypo / Hyperglycemic Management (close monitor of Blood Glucose) 0 '[]'$  - Ankle / Brachial Index (ABI) - do not check if billed separately 0 X - Vital Signs 1 5 Has the patient been seen at the hospital within the last three years: Yes Total Score: 85 Level Of Care: New/Established - Level 3 Electronic Signature(s) Signed: 06/14/2016 3:05:53 PM By: Gretta Cool, RN, BSN, Kim RN, BSN Entered By: Gretta Cool, RN, BSN, Kim on 06/14/2016 13:43:52 Nathaniel Little ([id number]) -------------------------------------------------------------------------------- Encounter Discharge Information Details Patient Name: Nathaniel Little Date of Service: 06/14/2016 1:30 PM Medical Record Number: [id number] Patient Account Number: [id number] Date of Birth/Sex: Mar 15, 1946 (70 y.o. Male) Treating RN: Cornell Barman Primary Care Physician: PATIENT, NO Other Clinician: Referring Physician: Hinda Kehr Treating Physician/Extender: Frann Rider in Treatment: 2 Encounter Discharge  Information Items Discharge Pain Level: 0 Discharge Condition: Stable Ambulatory Status: Cane Discharge Destination: Home Transportation: Private Auto Accompanied By: self Schedule Follow-up Appointment: Yes Medication Reconciliation completed Yes and provided to Patient/Care Miracle Criado: Provided on Clinical Summary of Care: 06/14/2016 Form Type Recipient Paper Patient HB Electronic Signature(s) Signed: 06/14/2016 3:05:53 PM By: Gretta Cool RN, BSN, Kim RN, BSN Previous Signature: 06/14/2016 1:44:09 PM Version By: Ruthine Dose Entered By: Gretta Cool RN, BSN, Kim on 06/14/2016 13:44:46 Nathaniel Little ([id number]) -------------------------------------------------------------------------------- Lower Extremity Assessment Details Patient Name: Nathaniel Little Date of Service: 06/14/2016 1:30 PM Medical Record Number: [id number] Patient Account Number: [id number] Date of Birth/Sex: Dec 25, 1945 (70 y.o. Male) Treating RN: Cornell Barman Primary Care Physician: PATIENT, NO Other Clinician: Referring Physician: Hinda Kehr Treating Physician/Extender: Frann Rider in Treatment: 2 Vascular Assessment Pulses: Posterior Tibial Dorsalis Pedis Palpable: [Left:Yes] Extremity colors, hair growth, and conditions: Extremity Color: [Left:Red] Hair Growth on Extremity: [Left:Yes] Temperature of Extremity: [Left:Warm] Capillary Refill: [Left:< 3 seconds] Dependent Rubor: [Left:No] Blanched when Elevated: [Left:No] Lipodermatosclerosis: [Left:No] Toe Nail Assessment Left: Right: Thick: Yes Discolored: Yes Deformed: Yes Improper Length and Hygiene: Yes Electronic Signature(s) Signed: 06/14/2016 3:05:53 PM By: Gretta Cool, RN, BSN, Kim RN, BSN Entered By: Gretta Cool, RN, BSN, Kim on 06/14/2016 13:29:02 Nathaniel Little ([id number]) -------------------------------------------------------------------------------- Multi Wound Chart Details Patient Name: Nathaniel Little Date of Service: 06/14/2016 1:30 PM Medical  Record Number: [id number] Patient Account Number: [id number] Date of Birth/Sex: Feb 28, 1946 (70 y.o. Male) Treating RN: Cornell Barman Primary Care Physician: PATIENT, NO Other Clinician: Referring Physician: Hinda Kehr Treating Physician/Extender: Frann Rider in Treatment: 2 Vital Signs Height(in): 69 Pulse(bpm): 82 Weight(lbs): 260 Blood Pressure 143/79 (mmHg): Body Mass Index(BMI): 38 Temperature(F): 98.3 Respiratory Rate 18 (breaths/min): Photos: [N/A:N/A] Wound Location: Left Lower Leg Lower Leg - Inferior N/A Wounding Event: Trauma Gradually  Appeared N/A Primary Etiology: Trauma, Other To be determined N/A Secondary Etiology: Lymphedema N/A N/A Comorbid History: Cataracts, Chronic Cataracts, Chronic N/A Obstructive Pulmonary Obstructive Pulmonary Disease (COPD), Disease (COPD), Hypertension, Hypertension, Osteoarthritis, Received Osteoarthritis, Received Radiation Radiation Date Acquired: 05/17/2016 06/03/2016 N/A Weeks of Treatment: 2 1 N/A Wound Status: Open Open N/A Measurements L x W x D 3.8x0.9x0.2 0.5x0.3x0.1 N/A (cm) Area (cm) : 2.686 0.118 N/A Volume (cm) : 0.537 0.012 N/A % Reduction in Area: 43.00% 58.30% N/A % Reduction in Volume: 62.00% 57.10% N/A Classification: Full Thickness Without Partial Thickness N/A Exposed Support Structures Exudate Amount: Large Large N/A Kocsis, Jobie ([id number]) Exudate Type: Serous Serous N/A Exudate Color: amber amber N/A Wound Margin: Flat and Intact Distinct, outline attached N/A Granulation Amount: Medium (34-66%) Small (1-33%) N/A Granulation Quality: Pink Red N/A Necrotic Amount: Medium (34-66%) Large (67-100%) N/A Necrotic Tissue: Eschar, Adherent Slough Adherent Slough N/A Exposed Structures: Fascia: No Fascia: No N/A Fat: No Fat: No Tendon: No Tendon: No Muscle: No Muscle: No Joint: No Joint: No Bone: No Bone: No Limited to Skin Limited to Skin Breakdown Breakdown Epithelialization:  Small (1-33%) N/A N/A Periwound Skin Texture: Induration: Yes No Abnormalities Noted N/A Scarring: Yes Edema: No Excoriation: No Callus: No Crepitus: No Fluctuance: No Friable: No Rash: No Periwound Skin Maceration: No Moist: Yes N/A Moisture: Moist: No Dry/Scaly: No Periwound Skin Color: Erythema: Yes No Abnormalities Noted N/A Atrophie Blanche: No Cyanosis: No Ecchymosis: No Hemosiderin Staining: No Mottled: No Pallor: No Rubor: No Erythema Location: Circumferential N/A N/A Temperature: No Abnormality No Abnormality N/A Tenderness on No Yes N/A Palpation: Wound Preparation: Ulcer Cleansing: Ulcer Cleansing: N/A Rinsed/Irrigated with Rinsed/Irrigated with Saline Saline Topical Anesthetic Topical Anesthetic Applied: Other: liodocaine Applied: Other: lidocaine 4% 4% Treatment Notes JASER, FULLEN ([id number]) Electronic Signature(s) Signed: 06/14/2016 3:05:53 PM By: Gretta Cool, RN, BSN, Kim RN, BSN Entered By: Gretta Cool, RN, BSN, Kim on 06/14/2016 13:36:28 GUISEPPE, FLANAGAN ([id number]) -------------------------------------------------------------------------------- Multi-Disciplinary Care Plan Details Patient Name: Nathaniel Little Date of Service: 06/14/2016 1:30 PM Medical Record Number: [id number] Patient Account Number: [id number] Date of Birth/Sex: 1945-11-03 (70 y.o. Male) Treating RN: Cornell Barman Primary Care Physician: PATIENT, NO Other Clinician: Referring Physician: Hinda Kehr Treating Physician/Extender: Frann Rider in Treatment: 2 Active Inactive Abuse / Safety / Falls / Self Care Management Nursing Diagnoses: Potential for falls Goals: Patient will remain injury free Date Initiated: 05/31/2016 Goal Status: Active Interventions: Assess fall risk on admission and as needed Notes: Necrotic Tissue Nursing Diagnoses: Impaired tissue integrity related to necrotic/devitalized tissue Goals: Necrotic/devitalized tissue will be minimized in the wound  bed Date Initiated: 05/31/2016 Goal Status: Active Interventions: Assess patient pain level pre-, during and post procedure and prior to discharge Treatment Activities: Apply topical anesthetic as ordered : 05/31/2016 Notes: Orientation to the Wound Care Program Nursing Diagnoses: Knowledge deficit related to the wound healing center program JABIR, DAHLEM ([id number]) Goals: Patient/caregiver will verbalize understanding of the [facility] Date Initiated: 05/31/2016 Goal Status: Active Interventions: Provide education on orientation to the wound center Notes: Wound/Skin Impairment Nursing Diagnoses: Impaired tissue integrity Knowledge deficit related to ulceration/compromised skin integrity Goals: Patient/caregiver will verbalize understanding of skin care regimen Date Initiated: 05/31/2016 Goal Status: Active Ulcer/skin breakdown will have a volume reduction of 30% by week 4 Date Initiated: 05/31/2016 Goal Status: Active Interventions: Assess patient/caregiver ability to obtain necessary supplies Assess ulceration(s) every visit Treatment Activities: Topical wound management initiated : 05/31/2016 Notes: Electronic Signature(s) Signed: 06/14/2016 3:05:53 PM By: Gretta Cool, RN, BSN, Kim RN, BSN  Entered By: Gretta Cool, RN, BSN, Kim on 06/14/2016 13:33:08 Nathaniel Little ([id number]) -------------------------------------------------------------------------------- Pain Assessment Details Patient Name: MOKSH, LOOMER Date of Service: 06/14/2016 1:30 PM Medical Record Number: [id number] Patient Account Number: [id number] Date of Birth/Sex: Nov 02, 1945 (70 y.o. Male) Treating RN: Cornell Barman Primary Care Physician: PATIENT, NO Other Clinician: Referring Physician: Hinda Kehr Treating Physician/Extender: Frann Rider in Treatment: 2 Active Problems Location of Pain Severity and Description of Pain Patient Has Paino No Site Locations With Dressing Change: No Pain  Management and Medication Current Pain Management: Electronic Signature(s) Signed: 06/14/2016 3:05:53 PM By: Gretta Cool, RN, BSN, Kim RN, BSN Entered By: Gretta Cool, RN, BSN, Kim on 06/14/2016 13:24:35 Nathaniel Little ([id number]) -------------------------------------------------------------------------------- Patient/Caregiver Education Details Patient Name: Nathaniel Little Date of Service: 06/14/2016 1:30 PM Medical Record Number: [id number] Patient Account Number: [id number] Date of Birth/Gender: 1946-07-19 (70 y.o. Male) Treating RN: Cornell Barman Primary Care Physician: PATIENT, NO Other Clinician: Referring Physician: Hinda Kehr Treating Physician/Extender: Frann Rider in Treatment: 2 Education Assessment Education Provided To: Patient Education Topics Provided Wound/Skin Impairment: Handouts: Caring for Your Ulcer, Other: continue wound care as prescribed Methods: Demonstration, Explain/Verbal Responses: State content correctly Electronic Signature(s) Signed: 06/14/2016 3:05:53 PM By: Gretta Cool, RN, BSN, Kim RN, BSN Entered By: Gretta Cool, RN, BSN, Kim on 06/14/2016 13:45:07 Nathaniel Little ([id number]) -------------------------------------------------------------------------------- Wound Assessment Details Patient Name: Nathaniel Little Date of Service: 06/14/2016 1:30 PM Medical Record Number: [id number] Patient Account Number: [id number] Date of Birth/Sex: [birth year] (70 y.o. Male) Treating RN: Cornell Barman Primary Care Physician: PATIENT, NO Other Clinician: Referring Physician: Hinda Kehr Treating Physician/Extender: Frann Rider in Treatment: 2 Wound Status Wound Number: 1 Primary Trauma, Other Etiology: Wound Location: Left Lower Leg Secondary Lymphedema Wounding Event: Trauma Etiology: Date Acquired: 05/17/2016 Wound Open Weeks Of Treatment: 2 Status: Clustered Wound: No Comorbid Cataracts, Chronic Obstructive History: Pulmonary Disease (COPD), Hypertension,  Osteoarthritis, Received Radiation Photos Wound Measurements Length: (cm) 3.8 Width: (cm) 0.9 Depth: (cm) 0.2 Area: (cm) 2.686 Volume: (cm) 0.537 % Reduction in Area: 43% % Reduction in Volume: 62% Epithelialization: Small (1-33%) Tunneling: No Undermining: No Wound Description Full Thickness Without Exposed Classification: Support Structures Wound Margin: Flat and Intact Exudate Large Amount: Exudate Type: Serous Exudate Color: amber Wound Bed [place]alt, Shin ([id number]) Granulation Amount: Medium (34-66%) Exposed Structure Granulation Quality: Pink Fascia Exposed: No Necrotic Amount: Medium (34-66%) Fat Layer Exposed: No Necrotic Quality: Eschar, Adherent Slough Tendon Exposed: No Muscle Exposed: No Joint Exposed: No Bone Exposed: No Limited to Skin Breakdown Periwound Skin Texture Texture Color No Abnormalities Noted: No No Abnormalities Noted: No Callus: No Atrophie Blanche: No Crepitus: No Cyanosis: No Excoriation: No Ecchymosis: No Fluctuance: No Erythema: Yes Friable: No Erythema Location: Circumferential Induration: Yes Hemosiderin Staining: No Localized Edema: No Mottled: No Rash: No Pallor: No Scarring: Yes Rubor: No Moisture Temperature / Pain No Abnormalities Noted: No Temperature: No Abnormality Dry / Scaly: No Maceration: No Moist: No Wound Preparation Ulcer Cleansing: Rinsed/Irrigated with Saline Topical Anesthetic Applied: Other: liodocaine 4%, Treatment Notes Wound #1 (Left Lower Leg) 1. Cleansed with: Clean wound with Normal Saline 2. Anesthetic Topical Lidocaine 4% cream to wound bed prior to debridement 4. Dressing Applied: Medihoney Gel 5. Secondary Dressing Applied Dry Benjamin Signature(s) Signed: 06/14/2016 3:05:53 PM By: Gretta Cool, RN, BSN, Kim RN, BSN Entered By: Gretta Cool, RN, BSN, Kim on 06/14/2016 13:30:45 PRAKASH, KIMBERLING ([id number]) EVAAN, TIDWELL  ([id number]) -------------------------------------------------------------------------------- Wound Assessment Details Patient Name: Nathaniel Little Date of Service: 06/14/2016 1:30 PM Medical Record Number: [id number] Patient Account Number:  [id number] Date of Birth/Sex: 31-Jul-1946 (70 y.o. Male) Treating RN: Cornell Barman Primary Care Physician: PATIENT, NO Other Clinician: Referring Physician: Hinda Kehr Treating Physician/Extender: Frann Rider in Treatment: 2 Wound Status Wound Number: 2 Primary To be determined Etiology: Wound Location: Lower Leg - Inferior Wound Open Wounding Event: Gradually Appeared Status: Date Acquired: 06/03/2016 Comorbid Cataracts, Chronic Obstructive Weeks Of Treatment: 1 History: Pulmonary Disease (COPD), Clustered Wound: No Hypertension, Osteoarthritis, Received Radiation Photos Wound Measurements Length: (cm) 0.5 Width: (cm) 0.3 Depth: (cm) 0.1 Area: (cm) 0.118 Volume: (cm) 0.012 % Reduction in Area: 58.3% % Reduction in Volume: 57.1% Tunneling: No Undermining: No Wound Description Classification: Partial Thickness Wound Margin: Distinct, outline attached Exudate Amount: Large Exudate Type: Serous Exudate Color: amber Foul Odor After Cleansing: No Wound Bed Granulation Amount: Small (1-33%) Exposed Structure Granulation Quality: Red Fascia Exposed: No Necrotic Amount: Large (67-100%) Fat Layer Exposed: No Necrotic Quality: Adherent Slough Tendon Exposed: No Englander, Reon ([id number]) Muscle Exposed: No Joint Exposed: No Bone Exposed: No Limited to Skin Breakdown Periwound Skin Texture Texture Color No Abnormalities Noted: No No Abnormalities Noted: No Moisture Temperature / Pain No Abnormalities Noted: No Temperature: No Abnormality Moist: Yes Tenderness on Palpation: Yes Wound Preparation Ulcer Cleansing: Rinsed/Irrigated with Saline Topical Anesthetic Applied: Other: lidocaine 4%, Treatment Notes Wound #2  (Inferior Lower Leg) 1. Cleansed with: Clean wound with Normal Saline 2. Anesthetic Topical Lidocaine 4% cream to wound bed prior to debridement 4. Dressing Applied: Medihoney Gel 5. Secondary Dressing Applied Dry Merino Signature(s) Signed: 06/14/2016 3:05:53 PM By: Gretta Cool, RN, BSN, Kim RN, BSN Entered By: Gretta Cool, RN, BSN, Kim on 06/14/2016 13:32:57 SHAWNA, KIENER ([id number]) -------------------------------------------------------------------------------- Vitals Details Patient Name: Nathaniel Little Date of Service: 06/14/2016 1:30 PM Medical Record Number: [id number] Patient Account Number: [id number] Date of Birth/Sex: 1946-06-03 (70 y.o. Male) Treating RN: Cornell Barman Primary Care Physician: PATIENT, NO Other Clinician: Referring Physician: Hinda Kehr Treating Physician/Extender: Frann Rider in Treatment: 2 Vital Signs Time Taken: 13:24 Temperature (F): 98.3 Height (in): 69 Pulse (bpm): 82 Weight (lbs): 260 Respiratory Rate (breaths/min): 18 Body Mass Index (BMI): 38.4 Blood Pressure (mmHg): 143/79 Reference Range: 80 - 120 mg / dl Electronic Signature(s) Signed: 06/14/2016 3:05:53 PM By: Gretta Cool, RN, BSN, Kim RN, BSN Entered By: Gretta Cool, RN, BSN, Kim on 06/14/2016 13:24:57

## 2016-06-14 NOTE — ED Notes (Addendum)
Pt ambulatory oxygen saturation 95% at baseline down to 93% while ambulating. Back in bed at this time. Pt does well, but does increase in exertion moderately. Pt able to talk in full sentences while sitting in bed.  Pt ate snack and had a drink. Tolerated well.

## 2016-06-14 NOTE — Progress Notes (Signed)
DEMARCO, BACCI ([id number]) Visit Report for 06/14/2016 Chief Complaint Document Details Patient Name: Nathaniel Little, Nathaniel Little Date of Service: 06/14/2016 1:30 PM Medical Record Number: [id number] Patient Account Number: [id number] Date of Birth/Sex: June 13, 1946 (70 y.o. Male) Treating RN: Cornell Barman Primary Care Physician: PATIENT, NO Other Clinician: Referring Physician: Hinda Kehr Treating Physician/Extender: Frann Rider in Treatment: 2 Information Obtained from: Patient Chief Complaint Patient seen for complaints of Non-Healing Wound for his left lower extremity for 2 weeks Electronic Signature(s) Signed: 06/14/2016 1:41:29 PM By: Christin Fudge MD, FACS Entered By: Christin Fudge on 06/14/2016 13:41:29 Nathaniel Little ([id number]) -------------------------------------------------------------------------------- HPI Details Patient Name: Nathaniel Little Date of Service: 06/14/2016 1:30 PM Medical Record Number: [id number] Patient Account Number: [id number] Date of Birth/Sex: [birth year] (69 y.o. Male) Treating RN: Cornell Barman Primary Care Physician: PATIENT, NO Other Clinician: Referring Physician: Hinda Kehr Treating Physician/Extender: Frann Rider in Treatment: 2 History of Present Illness Location: left lower extremity Quality: Patient reports experiencing a sharp pain to affected area(s). Severity: Patient states wound are getting worse. Duration: Patient has had the wound for < 2 weeks prior to presenting for treatment Timing: Pain in wound is constant (hurts all the time) Context: The wound occurred when the patient a piece of plywood lacerated wound on his left lower extremity on the shin Modifying Factors: Consults to this date include:Keflex and Bactrim Associated Signs and Symptoms: Patient reports having increase swelling. HPI Description: 70 year old gentleman who presented to the ER a week ago with a history of having injury to his left leg with a piece of plywood  which lacerated his leg. Past medical system history significant for COPD and hypertension.he is a former smoker who quit smoking recently. the ER he was found to have a vertical laceration on the left lower extremity which is foul-smelling and had no abscess or discharge.an x-ray was done which was unremarkable. report reviewed -- Mild soft tissue edema is noted consistent with the given clinical history. No definitive radiopaque foreign body is seen. No bony abnormality is noted. He was started on Keflex 500 mg twice daily and Bactrim DS 1 tablet twice daily. He was seen back in the ER on 05/26/2016 and was found to have good progression of healing and no generalized cellulitis and did not warrant inpatient admission. 06/04/2016 -- the patient has not followed any of her instructions he saw him last week and has been doing dry dressings of his wound only. 06/14/2016 -- not yet got his compression stockings and he is using Medihoney pads Electronic Signature(s) Signed: 06/14/2016 1:42:08 PM By: Christin Fudge MD, FACS Entered By: Christin Fudge on 06/14/2016 13:42:07 REI, MEDLEN ([id number]) -------------------------------------------------------------------------------- Physical Exam Details Patient Name: Nathaniel Little Date of Service: 06/14/2016 1:30 PM Medical Record Number: [id number] Patient Account Number: [id number] Date of Birth/Sex: 1946/01/20 (70 y.o. Male) Treating RN: Cornell Barman Primary Care Physician: PATIENT, NO Other Clinician: Referring Physician: Hinda Kehr Treating Physician/Extender: Frann Rider in Treatment: 2 Constitutional . Pulse regular. Respirations normal and unlabored. Afebrile. . Eyes Nonicteric. Reactive to light. Ears, Nose, Mouth, and Throat Lips, teeth, and gums WNL.[name] Moist mucosa without lesions. Neck supple and nontender. No palpable supraclavicular or cervical adenopathy. Normal sized without goiter. Respiratory WNL. No retractions..  Breath sounds WNL, No rubs, rales, rhonchi, or wheeze.. Cardiovascular Heart rhythm and rate regular, no murmur or gallop.. Pedal Pulses WNL. No clubbing, cyanosis or edema. Lymphatic No adneopathy. No adenopathy. No adenopathy. Musculoskeletal Adexa without tenderness or enlargement.. Digits and nails w/o clubbing, cyanosis, infection, petechiae,  ischemia, or inflammatory conditions.. Integumentary (Hair, Skin) No suspicious lesions. No crepitus or fluctuance. No peri-wound warmth or erythema. No masses.[name] Psychiatric Judgement and insight Intact.. No evidence of depression, anxiety, or agitation.. Notes he continues to have minimal debris over his wound and overall is looking better. No sharp debridement was done today. Electronic Signature(s) Signed: 06/14/2016 1:42:47 PM By: Christin Fudge MD, FACS Entered By: Christin Fudge on 06/14/2016 13:42:47 Nathaniel Little ([id number]) -------------------------------------------------------------------------------- Physician Orders Details Patient Name: Nathaniel Little Date of Service: 06/14/2016 1:30 PM Medical Record Number: [id number] Patient Account Number: [id number] Date of Birth/Sex: 07-11-1946 (70 y.o. Male) Treating RN: Cornell Barman Primary Care Physician: PATIENT, NO Other Clinician: Referring Physician: Hinda Kehr Treating Physician/Extender: Frann Rider in Treatment: 2 Verbal / Phone Orders: Yes Clinician: Cornell Barman Read Back and Verified: Yes Diagnosis Coding Wound Cleansing Wound #1 Left Lower Leg o Clean wound with Normal Saline. o May Shower, gently pat wound dry prior to applying new dressing. Wound #2 Inferior Lower Leg o Clean wound with Normal Saline. o May Shower, gently pat wound dry prior to applying new dressing. Anesthetic Wound #1 Left Lower Leg o Topical Lidocaine 4% cream applied to wound bed prior to debridement Wound #2 Inferior Lower Leg o Topical Lidocaine 4% cream applied to wound  bed prior to debridement Primary Wound Dressing Wound #1 Left Lower Leg o Medihoney gel - Manuka Honey over the counter Wound #2 Inferior Lower Leg o Medihoney gel - Manuka Honey over the counter Secondary Dressing Wound #1 Left Lower Leg o Boardered Foam Dressing - Platter Wound #2 Inferior Lower Leg o Boardered Foam Dressing - [place] Dressing Change Frequency Wound #1 Left Lower Leg o Change dressing every day. Wound #2 Inferior Lower Leg o Change dressing every day. BRADAN, CONGROVE ([id number]) Follow-up Appointments Wound #1 Left Lower Leg o Other: - Friday Wound #2 Inferior Lower Leg o Other: - Friday Edema Control Wound #1 Left Lower Leg o Patient to wear own compression stockings o Elevate legs to the level of the heart and pump ankles as often as possible Wound #2 Inferior Lower Leg o Patient to wear own compression stockings o Elevate legs to the level of the heart and pump ankles as often as possible Medications-please add to medication list. Wound #1 Left Lower Leg o Other: - Manuka Honey Wound #2 Inferior Lower Leg o Other: - Manuka Honey Electronic Signature(s) Signed: 06/14/2016 3:05:53 PM By: Gretta Cool, RN, BSN, Kim RN, BSN Signed: 06/14/2016 4:17:39 PM By: Christin Fudge MD, FACS Entered By: Gretta Cool RN, BSN, Kim on 06/14/2016 13:39:07 MARCELLIUS, MONTAGNA ([id number]) -------------------------------------------------------------------------------- Problem List Details Patient Name: Nathaniel Little Date of Service: 06/14/2016 1:30 PM Medical Record Number: [id number] Patient Account Number: [id number] Date of Birth/Sex: 16-Feb-1946 (69 y.o. Male) Treating RN: Cornell Barman Primary Care Physician: PATIENT, NO Other Clinician: Referring Physician: Hinda Kehr Treating Physician/Extender: Frann Rider in Treatment: 2 Active Problems ICD-10 Encounter Code Description Active Date Diagnosis S81.812A Laceration without foreign body,  left lower leg, initial 05/31/2016 Yes encounter L97.222 Non-pressure chronic ulcer of left calf with fat layer 05/31/2016 Yes exposed I89.0 Lymphedema, not elsewhere classified 05/31/2016 Yes Z87.891 Personal history of nicotine dependence 05/31/2016 Yes E66.01 Morbid (severe) obesity due to excess calories 05/31/2016 Yes Inactive Problems Resolved Problems Electronic Signature(s) Signed: 06/14/2016 1:41:19 PM By: Christin Fudge MD, FACS Entered By: Christin Fudge on 06/14/2016 13:41:18 Nathaniel Little ([id number]) -------------------------------------------------------------------------------- Progress Note Details Patient Name: Nathaniel Little Date of Service: 06/14/2016 1:30 PM Medical Record Number:  [id number] Patient Account Number: [id number] Date of Birth/Sex: 10/19/1945 (70 y.o. Male) Treating RN: Cornell Barman Primary Care Physician: PATIENT, NO Other Clinician: Referring Physician: Hinda Kehr Treating Physician/Extender: Frann Rider in Treatment: 2 Subjective Chief Complaint Information obtained from Patient Patient seen for complaints of Non-Healing Wound for his left lower extremity for 2 weeks History of Present Illness (HPI) The following HPI elements were documented for the patient's wound: Location: left lower extremity Quality: Patient reports experiencing a sharp pain to affected area(s). Severity: Patient states wound are getting worse. Duration: Patient has had the wound for < 2 weeks prior to presenting for treatment Timing: Pain in wound is constant (hurts all the time) Context: The wound occurred when the patient a piece of plywood lacerated wound on his left lower extremity on the shin Modifying Factors: Consults to this date include:Keflex and Bactrim Associated Signs and Symptoms: Patient reports having increase swelling. 70 year old gentleman who presented to the ER a week ago with a history of having injury to his left leg with a piece of plywood which  lacerated his leg. Past medical system history significant for COPD and hypertension.he is a former smoker who quit smoking recently. the ER he was found to have a vertical laceration on the left lower extremity which is foul-smelling and had no abscess or discharge.an x-ray was done which was unremarkable. report reviewed -- Mild soft tissue edema is noted consistent with the given clinical history. No definitive radiopaque foreign body is seen. No bony abnormality is noted. He was started on Keflex 500 mg twice daily and Bactrim DS 1 tablet twice daily. He was seen back in the ER on 05/26/2016 and was found to have good progression of healing and no generalized cellulitis and did not warrant inpatient admission. 06/04/2016 -- the patient has not followed any of her instructions he saw him last week and has been doing dry dressings of his wound only. 06/14/2016 -- not yet got his compression stockings and he is using Medihoney pads Objective Luppino, Richardson ([id number]) Constitutional Pulse regular. Respirations normal and unlabored. Afebrile. Vitals Time Taken: 1:24 PM, Height: 69 in, Weight: 260 lbs, BMI: 38.4, Temperature: 98.3 F, Pulse: 82 bpm, Respiratory Rate: 18 breaths/min, Blood Pressure: 143/79 mmHg. Eyes Nonicteric. Reactive to light. Ears, Nose, Mouth, and Throat Lips, teeth, and gums WNL.[name] Moist mucosa without lesions. Neck supple and nontender. No palpable supraclavicular or cervical adenopathy. Normal sized without goiter. Respiratory WNL. No retractions.. Breath sounds WNL, No rubs, rales, rhonchi, or wheeze.. Cardiovascular Heart rhythm and rate regular, no murmur or gallop.. Pedal Pulses WNL. No clubbing, cyanosis or edema. Lymphatic No adneopathy. No adenopathy. No adenopathy. Musculoskeletal Adexa without tenderness or enlargement.. Digits and nails w/o clubbing, cyanosis, infection, petechiae, ischemia, or inflammatory conditions.[name] Psychiatric Judgement and  insight Intact.. No evidence of depression, anxiety, or agitation.. General Notes: he continues to have minimal debris over his wound and overall is looking better. No sharp debridement was done today. Integumentary (Hair, Skin) No suspicious lesions. No crepitus or fluctuance. No peri-wound warmth or erythema. No masses.. Wound #1 status is Open. Original cause of wound was Trauma. The wound is located on the Left Lower Leg. The wound measures 3.8cm length x 0.9cm width x 0.2cm depth; 2.686cm^2 area and 0.537cm^3 volume. The wound is limited to skin breakdown. There is no tunneling or undermining noted. There is a large amount of serous drainage noted. The wound margin is flat and intact. There is medium (34-66%) pink granulation within the wound  bed. There is a medium (34-66%) amount of necrotic tissue within the wound bed including Eschar and Adherent Slough. The periwound skin appearance exhibited: Induration, Scarring, Erythema. The periwound skin appearance did not exhibit: Callus, Crepitus, Excoriation, Fluctuance, Friable, Localized Edema, Rash, Dry/Scaly, Maceration, Moist, Atrophie Blanche, Cyanosis, Ecchymosis, Hemosiderin Staining, Mottled, Pallor, Rubor. The surrounding wound skin color is noted with erythema which is circumferential. Periwound temperature was noted as No Abnormality. CUETO, Ervine ([id number]) Wound #2 status is Open. Original cause of wound was Gradually Appeared. The wound is located on the Inferior Lower Leg. The wound measures 0.5cm length x 0.3cm width x 0.1cm depth; 0.118cm^2 area and 0.012cm^3 volume. The wound is limited to skin breakdown. There is no tunneling or undermining noted. There is a large amount of serous drainage noted. The wound margin is distinct with the outline attached to the wound base. There is small (1-33%) red granulation within the wound bed. There is a large (67-100%) amount of necrotic tissue within the wound bed including Adherent  Slough. The periwound skin appearance exhibited: Moist. Periwound temperature was noted as No Abnormality. The periwound has tenderness on palpation. Assessment Active Problems ICD-10 S81.812A - Laceration without foreign body, left lower leg, initial encounter L97.222 - Non-pressure chronic ulcer of left calf with fat layer exposed I89.0 - Lymphedema, not elsewhere classified Z87.891 - Personal history of nicotine dependence E66.01 - Morbid (severe) obesity due to excess calories Plan Wound Cleansing: Wound #1 Left Lower Leg: Clean wound with Normal Saline. May Shower, gently pat wound dry prior to applying new dressing. Wound #2 Inferior Lower Leg: Clean wound with Normal Saline. May Shower, gently pat wound dry prior to applying new dressing. Anesthetic: Wound #1 Left Lower Leg: Topical Lidocaine 4% cream applied to wound bed prior to debridement Wound #2 Inferior Lower Leg: Topical Lidocaine 4% cream applied to wound bed prior to debridement Primary Wound Dressing: Wound #1 Left Lower Leg: Medihoney gel - Manuka Honey over the counter Wound #2 Inferior Lower Leg: Medihoney gel - Manuka Honey over the counter Secondary Dressing: Wound #1 Left Lower Leg: Boardered Foam [place] Homer, Kamon ([id number]) Wound #2 Inferior Lower Leg: Boardered [place] Dressing Change Frequency: Wound #1 Left Lower Leg: Change dressing every day. Wound #2 Inferior Lower Leg: Change dressing every day. Follow-up Appointments: Wound #1 Left Lower Leg: Other: - Friday Wound #2 Inferior Lower Leg: Other: - Friday Edema Control: Wound #1 Left Lower Leg: Patient to wear own compression stockings Elevate legs to the level of the heart and pump ankles as often as possible Wound #2 Inferior Lower Leg: Patient to wear own compression stockings Elevate legs to the level of the heart and pump ankles as often as possible Medications-please add to medication  list.: Wound #1 Left Lower Leg: Other: - Manuka Honey Wound #2 Inferior Lower Leg: Other: - Manuka Honey After reiew I have recommended: 1. wash the wound daily with soap and water. 2. use medihoney daily and cover with a bordered foam 3. elevation and exercise 4. Compression stockings of the 20-30 mm variety to be applied daily and details of this discussed with him. Electronic Signature(s) Signed: 06/14/2016 1:43:01 PM By: Christin Fudge MD, FACS Entered By: Christin Fudge on 06/14/2016 13:43:01 Nathaniel Little ([id number]) -------------------------------------------------------------------------------- SuperBill Details Patient Name: Nathaniel Little Date of Service: 06/14/2016 Medical Record Number: [id number] Patient Account Number: [id number] Date of Birth/Sex: 20-Jul-1946 (69 y.o. Male) Treating RN: Cornell Barman Primary Care Physician: PATIENT, NO Other Clinician:  Referring Physician: Hinda Kehr Treating Physician/Extender: Frann Rider in Treatment: 2 Diagnosis Coding ICD-10 Codes Code Description [id number] Laceration without foreign body, left lower leg, initial encounter L97.222 Non-pressure chronic ulcer of left calf with fat layer exposed I89.0 Lymphedema, not elsewhere classified Z87.891 Personal history of nicotine dependence E66.01 Morbid (severe) obesity due to excess calories Facility Procedures CPT4 Code: [id number] Description: 99213 - WOUND CARE VISIT-LEV 3 EST PT Modifier: Quantity: 1 Physician Procedures CPT4 Code Description: [id number] 99213 - WC PHYS LEVEL 3 - EST PT ICD-10 Description Diagnosis S81.812A Laceration without foreign body, left lower leg, ini L97.222 Non-pressure chronic ulcer of left calf with fat lay I89.0 Lymphedema, not elsewhere  classified E66.01 Morbid (severe) obesity due to excess calories Modifier: tial encounter er exposed Quantity: 1 Electronic Signature(s) Signed: 06/14/2016 3:05:53 PM By: Gretta Cool, RN, BSN, Kim RN, BSN Signed:  06/14/2016 4:17:39 PM By: Christin Fudge MD, FACS Previous Signature: 06/14/2016 1:43:16 PM Version By: Christin Fudge MD, FACS Entered By: Gretta Cool, RN, BSN, Kim on 06/14/2016 13:57:55

## 2016-06-14 NOTE — ED Notes (Signed)
MD at bedside. 

## 2016-06-14 NOTE — ED Notes (Signed)
Pt alert and oriented X4, active, cooperative, pt in NAD. RR even and unlabored, color WNL.  Pt informed to return if any life threatening symptoms occur.   

## 2016-06-14 NOTE — ED Triage Notes (Addendum)
States feeling SOB for 1 week, worse when he lies down at night, hx of COPD and heart mumur, pt awake and alert in no acute distress, speaking in full clear sentances, states productive cough

## 2016-06-14 NOTE — ED Notes (Signed)
Pt wheezing isn't as prominent as before but still present upon assessment.

## 2016-06-14 NOTE — ED Notes (Signed)
Report to Henry, RN.

## 2016-06-21 ENCOUNTER — Encounter: Payer: Commercial Managed Care - HMO | Admitting: Surgery

## 2016-06-21 DIAGNOSIS — S81812A Laceration without foreign body, left lower leg, initial encounter: Secondary | ICD-10-CM | POA: Diagnosis not present

## 2016-06-22 NOTE — Progress Notes (Signed)
Nathaniel Little, Nathaniel Little ([id number]) Visit Report for 06/21/2016 Chief Complaint Document Details Patient Name: Nathaniel Little, Nathaniel Little Date of Service: 06/21/2016 10:45 AM Medical Record Number: [id number] Patient Account Number: [id number] Date of Birth/Sex: [birth year] (70 y.o. Male) Treating RN: Carolyne Fiscal, Debi Primary Care Physician: PATIENT, NO Other Clinician: Referring Physician: Hinda Kehr Treating Physician/Extender: Frann Rider in Treatment: 3 Information Obtained from: Patient Chief Complaint Patient seen for complaints of Non-Healing Wound for his left lower extremity for 2 weeks Electronic Signature(s) Signed: 06/21/2016 11:31:04 AM By: Christin Fudge MD, FACS Entered By: Christin Fudge on 06/21/2016 11:31:04 Nathaniel Little ([id number]) -------------------------------------------------------------------------------- HPI Details Patient Name: Nathaniel Little Date of Service: 06/21/2016 10:45 AM Medical Record Number: [id number] Patient Account Number: [id number] Date of Birth/Sex: Feb 17, 1946 (69 y.o. Male) Treating RN: Carolyne Fiscal, Debi Primary Care Physician: PATIENT, NO Other Clinician: Referring Physician: Hinda Kehr Treating Physician/Extender: Frann Rider in Treatment: 3 History of Present Illness Location: left lower extremity Quality: Patient reports experiencing a sharp pain to affected area(s). Severity: Patient states wound are getting worse. Duration: Patient has had the wound for < 2 weeks prior to presenting for treatment Timing: Pain in wound is constant (hurts all the time) Context: The wound occurred when the patient a piece of plywood lacerated wound on his left lower extremity on the shin Modifying Factors: Consults to this date include:Keflex and Bactrim Associated Signs and Symptoms: Patient reports having increase swelling. HPI Description: 70 year old gentleman who presented to the ER a week ago with a history of having injury to his left leg with a  piece of plywood which lacerated his leg. Past medical system history significant for COPD and hypertension.he is a former smoker who quit smoking recently. the ER he was found to have a vertical laceration on the left lower extremity which is foul-smelling and had no abscess or discharge.an x-ray was done which was unremarkable. report reviewed -- Mild soft tissue edema is noted consistent with the given clinical history. No definitive radiopaque foreign body is seen. No bony abnormality is noted. He was started on Keflex 500 mg twice daily and Bactrim DS 1 tablet twice daily. He was seen back in the ER on 05/26/2016 and was found to have good progression of healing and no generalized cellulitis and did not warrant inpatient admission. 06/04/2016 -- the patient has not followed any of her instructions he saw him last week and has been doing dry dressings of his wound only. 06/14/2016 -- not yet got his compression stockings and he is using Medihoney pads. 06/21/2016 -- as he soiled his compression stockings but other than that has been using them regularly Electronic Signature(s) Signed: 06/21/2016 11:31:41 AM By: Christin Fudge MD, FACS Entered By: Christin Fudge on 06/21/2016 11:31:40 Nathaniel Little, Nathaniel Little ([id number]) -------------------------------------------------------------------------------- Physical Exam Details Patient Name: Nathaniel Little Date of Service: 06/21/2016 10:45 AM Medical Record Number: [id number] Patient Account Number: [id number] Date of Birth/Sex: 1946/04/03 (70 y.o. Male) Treating RN: Carolyne Fiscal, Debi Primary Care Physician: PATIENT, NO Other Clinician: Referring Physician: Hinda Kehr Treating Physician/Extender: Frann Rider in Treatment: 3 Constitutional . Pulse regular. Respirations normal and unlabored. Afebrile. . Eyes Nonicteric. Reactive to light. Ears, Nose, Mouth, and Throat Lips, teeth, and gums WNL.[name] Moist mucosa without lesions. Neck supple and  nontender. No palpable supraclavicular or cervical adenopathy. Normal sized without goiter. Respiratory WNL. No retractions.. Cardiovascular Pedal Pulses WNL. No clubbing, cyanosis or edema. Lymphatic No adneopathy. No adenopathy. No adenopathy. Musculoskeletal Adexa without tenderness or enlargement.. Digits and nails w/o clubbing, cyanosis, infection, petechiae, ischemia,  or inflammatory conditions.. Integumentary (Hair, Skin) No suspicious lesions. No crepitus or fluctuance. No peri-wound warmth or erythema. No masses.[name] Psychiatric Judgement and insight Intact.. No evidence of depression, anxiety, or agitation.. Notes the wound is looking much cleaner today and I have been able to wash it out with moist saline gauze and no sharp debridement was required today. Electronic Signature(s) Signed: 06/21/2016 11:32:10 AM By: Christin Fudge MD, FACS Entered By: Christin Fudge on 06/21/2016 11:32:09 Nathaniel Little, Nathaniel Little ([id number]) -------------------------------------------------------------------------------- Physician Orders Details Patient Name: Nathaniel Little Date of Service: 06/21/2016 10:45 AM Medical Record Number: [id number] Patient Account Number: [id number] Date of Birth/Sex: May 06, 1946 (69 y.o. Male) Treating RN: Carolyne Fiscal, Debi Primary Care Physician: PATIENT, NO Other Clinician: Referring Physician: Hinda Kehr Treating Physician/Extender: Frann Rider in Treatment: 3 Verbal / Phone Orders: Yes Clinician: Pinkerton, Debi Read Back and Verified: Yes Diagnosis Coding Wound Cleansing Wound #1 Left Lower Leg o Clean wound with Normal Saline. o May Shower, gently pat wound dry prior to applying new dressing. Anesthetic Wound #1 Left Lower Leg o Topical Lidocaine 4% cream applied to wound bed prior to debridement Primary Wound Dressing Wound #1 Left Lower Leg o Aquacel Ag Secondary Dressing Wound #1 Left Lower Leg o Dry Gauze o Boardered Foam Dressing -  [place] Dressing Change Frequency Wound #1 Left Lower Leg o Change dressing every day. Follow-up Appointments Wound #1 Left Lower Leg o Other: - Friday Edema Control Wound #1 Left Lower Leg o Patient to wear own compression stockings o Elevate legs to the level of the heart and pump ankles as often as possible Medications-please add to medication list. Wound #1 Left Lower Leg o Other: - [place] Zebulon Gantt, Meir ([id number]) Electronic Signature(s) Signed: 06/21/2016 4:10:09 PM By: Christin Fudge MD, FACS Signed: 06/21/2016 4:53:07 PM By: Alric Quan Entered By: Alric Quan on 06/21/2016 11:28:03 Nathaniel Little ([id number]) -------------------------------------------------------------------------------- Problem List Details Patient Name: Nathaniel Little Date of Service: 06/21/2016 10:45 AM Medical Record Number: [id number] Patient Account Number: [id number] Date of Birth/Sex: 1946-02-24 (69 y.o. Male) Treating RN: Carolyne Fiscal, Debi Primary Care Physician: PATIENT, NO Other Clinician: Referring Physician: Hinda Kehr Treating Physician/Extender: Frann Rider in Treatment: 3 Active Problems ICD-10 Encounter Code Description Active Date Diagnosis S81.812A Laceration without foreign body, left lower leg, initial 05/31/2016 Yes encounter L97.222 Non-pressure chronic ulcer of left calf with fat layer 05/31/2016 Yes exposed I89.0 Lymphedema, not elsewhere classified 05/31/2016 Yes Z87.891 Personal history of nicotine dependence 05/31/2016 Yes E66.01 Morbid (severe) obesity due to excess calories 05/31/2016 Yes Inactive Problems Resolved Problems Electronic Signature(s) Signed: 06/21/2016 11:30:56 AM By: Christin Fudge MD, FACS Entered By: Christin Fudge on 06/21/2016 11:30:56 Nathaniel Little ([id number]) -------------------------------------------------------------------------------- Progress Note Details Patient Name: Nathaniel Little Date of Service: 06/21/2016  10:45 AM Medical Record Number: [id number] Patient Account Number: [id number] Date of Birth/Sex: 1946-04-30 (70 y.o. Male) Treating RN: Carolyne Fiscal, Debi Primary Care Physician: PATIENT, NO Other Clinician: Referring Physician: Hinda Kehr Treating Physician/Extender: Frann Rider in Treatment: 3 Subjective Chief Complaint Information obtained from Patient Patient seen for complaints of Non-Healing Wound for his left lower extremity for 2 weeks History of Present Illness (HPI) The following HPI elements were documented for the patient's wound: Location: left lower extremity Quality: Patient reports experiencing a sharp pain to affected area(s). Severity: Patient states wound are getting worse. Duration: Patient has had the wound for < 2 weeks prior to presenting for treatment Timing: Pain in wound is constant (hurts all the time) Context: The wound occurred when the patient a  piece of plywood lacerated wound on his left lower extremity on the shin Modifying Factors: Consults to this date include:Keflex and Bactrim Associated Signs and Symptoms: Patient reports having increase swelling. 71 year old gentleman who presented to the ER a week ago with a history of having injury to his left leg with a piece of plywood which lacerated his leg. Past medical system history significant for COPD and hypertension.he is a former smoker who quit smoking recently. the ER he was found to have a vertical laceration on the left lower extremity which is foul-smelling and had no abscess or discharge.an x-ray was done which was unremarkable. report reviewed -- Mild soft tissue edema is noted consistent with the given clinical history. No definitive radiopaque foreign body is seen. No bony abnormality is noted. He was started on Keflex 500 mg twice daily and Bactrim DS 1 tablet twice daily. He was seen back in the ER on 05/26/2016 and was found to have good progression of healing and no generalized  cellulitis and did not warrant inpatient admission. 06/04/2016 -- the patient has not followed any of her instructions he saw him last week and has been doing dry dressings of his wound only. 06/14/2016 -- not yet got his compression stockings and he is using Medihoney pads. 06/21/2016 -- as he soiled his compression stockings but other than that has been using them regularly Objective Nathaniel Little, Nathaniel Little ([id number]) Constitutional Pulse regular. Respirations normal and unlabored. Afebrile. Vitals Time Taken: 11:13 AM, Height: 69 in, Weight: 260 lbs, BMI: 38.4, Temperature: 98.4 F, Pulse: 70 bpm, Respiratory Rate: 18 breaths/min, Blood Pressure: 152/78 mmHg. Eyes Nonicteric. Reactive to light. Ears, Nose, Mouth, and Throat Lips, teeth, and gums WNL.[name] Moist mucosa without lesions. Neck supple and nontender. No palpable supraclavicular or cervical adenopathy. Normal sized without goiter. Respiratory WNL. No retractions.. Cardiovascular Pedal Pulses WNL. No clubbing, cyanosis or edema. Lymphatic No adneopathy. No adenopathy. No adenopathy. Musculoskeletal Adexa without tenderness or enlargement.. Digits and nails w/o clubbing, cyanosis, infection, petechiae, ischemia, or inflammatory conditions.[name] Psychiatric Judgement and insight Intact.. No evidence of depression, anxiety, or agitation.. General Notes: the wound is looking much cleaner today and I have been able to wash it out with moist saline gauze and no sharp debridement was required today. Integumentary (Hair, Skin) No suspicious lesions. No crepitus or fluctuance. No peri-wound warmth or erythema. No masses.. Wound #1 status is Open. Original cause of wound was Trauma. The wound is located on the Left Lower Leg. The wound measures 3.4cm length x 0.7cm width x 0.2cm depth; 1.869cm^2 area and 0.374cm^3 volume. The wound is limited to skin breakdown. There is no tunneling or undermining noted. There is a large amount of serosanguineous  drainage noted. The wound margin is flat and intact. There is medium (34-66%) red, pink granulation within the wound bed. There is a medium (34-66%) amount of necrotic tissue within the wound bed including Adherent Slough. The periwound skin appearance exhibited: Induration, Scarring, Erythema. The periwound skin appearance did not exhibit: Callus, Crepitus, Excoriation, Fluctuance, Friable, Localized Edema, Rash, Dry/Scaly, Maceration, Moist, Atrophie Blanche, Cyanosis, Ecchymosis, Hemosiderin Staining, Mottled, Pallor, Rubor. The surrounding wound skin color is noted with erythema which is circumferential. Periwound temperature was noted as No Abnormality. Nathaniel Little, Nathaniel Little ([id number]) Wound #2 status is Open. Original cause of wound was Gradually Appeared. The wound is located on the Left,Inferior Lower Leg. The wound measures 0cm length x 0cm width x 0cm depth; 0cm^2 area and 0cm^3 volume. The wound is limited to skin breakdown. There  is no tunneling or undermining noted. There is a none present amount of drainage noted. The wound margin is distinct with the outline attached to the wound base. There is large (67-100%) red granulation within the wound bed. There is no necrotic tissue within the wound bed. The periwound skin appearance did not exhibit: Moist. Periwound temperature was noted as No Abnormality. The periwound has tenderness on palpation. Assessment Active Problems ICD-10 S81.812A - Laceration without foreign body, left lower leg, initial encounter L97.222 - Non-pressure chronic ulcer of left calf with fat layer exposed I89.0 - Lymphedema, not elsewhere classified Z87.891 - Personal history of nicotine dependence E66.01 - Morbid (severe) obesity due to excess calories Plan Wound Cleansing: Wound #1 Left Lower Leg: Clean wound with Normal Saline. May Shower, gently pat wound dry prior to applying new dressing. Anesthetic: Wound #1 Left Lower Leg: Topical Lidocaine 4% cream  applied to wound bed prior to debridement Primary Wound Dressing: Wound #1 Left Lower Leg: Aquacel Ag Secondary Dressing: Wound #1 Left Lower Leg: Dry Gauze Boardered Foam Dressing - [place] Dressing Change Frequency: Wound #1 Left Lower Leg: Change dressing every day. Follow-up Appointments: Wound #1 Left Lower Leg: Other: - Friday Edema Control: Nathaniel Little, Nathaniel Little ([id number]) Wound #1 Left Lower Leg: Patient to wear own compression stockings Elevate legs to the level of the heart and pump ankles as often as possible Medications-please add to medication list.: Wound #1 Left Lower Leg: Other: - [place] Honey After reiew I have recommended: 1. wash the wound with soap and water and then use Silver alginate on alternate days and cover with a bordered foam 2. elevation and exercise 3. Compression stockings of the 20-30 mm variety to be applied daily and details of this discussed with him. Electronic Signature(s) Signed: 06/21/2016 11:33:15 AM By: Christin Fudge MD, FACS Entered By: Christin Fudge on 06/21/2016 11:33:15 Nathaniel Little ([id number]) -------------------------------------------------------------------------------- SuperBill Details Patient Name: Nathaniel Little Date of Service: 06/21/2016 Medical Record Number: [id number] Patient Account Number: [id number] Date of Birth/Sex: September 04, 1946 (69 y.o. Male) Treating RN: Carolyne Fiscal, Debi Primary Care Physician: PATIENT, NO Other Clinician: Referring Physician: Hinda Kehr Treating Physician/Extender: Frann Rider in Treatment: 3 Diagnosis Coding ICD-10 Codes Code Description [id number] Laceration without foreign body, left lower leg, initial encounter L97.222 Non-pressure chronic ulcer of left calf with fat layer exposed I89.0 Lymphedema, not elsewhere classified Z87.891 Personal history of nicotine dependence E66.01 Morbid (severe) obesity due to excess calories Facility Procedures CPT4 Code: [id number] Description:  99213 - WOUND CARE VISIT-LEV 3 EST PT Modifier: Quantity: 1 Physician Procedures CPT4 Code Description: [id number] 99213 - WC PHYS LEVEL 3 - EST PT ICD-10 Description Diagnosis S81.812A Laceration without foreign body, left lower leg, ini L97.222 Non-pressure chronic ulcer of left calf with fat lay I89.0 Lymphedema, not elsewhere  classified Modifier: tial encounter er exposed Quantity: 1 Electronic Signature(s) Signed: 06/21/2016 4:10:09 PM By: Christin Fudge MD, FACS Signed: 06/21/2016 4:53:07 PM By: Alric Quan Previous Signature: 06/21/2016 11:33:31 AM Version By: Christin Fudge MD, FACS Entered By: Alric Quan on 06/21/2016 12:09:10

## 2016-06-22 NOTE — Progress Notes (Signed)
BARRE, AYDELOTT ([id number]) Visit Report for 06/21/2016 Arrival Information Details Patient Name: Nathaniel Little, Nathaniel Little Date of Service: 06/21/2016 10:45 AM Medical Record Number: [id number] Patient Account Number: [id number] Date of Birth/Sex: 1946-06-23 (70 y.o. Male) Treating RN: Carolyne Fiscal, Debi Primary Care Physician: PATIENT, NO Other Clinician: Referring Physician: Hinda Kehr Treating Physician/Extender: Frann Rider in Treatment: 3 Visit Information History Since Last Visit All ordered tests and consults were completed: No Patient Arrived: Cane Added or deleted any medications: No Arrival Time: 11:11 Any new allergies or adverse reactions: No Accompanied By: self Had a fall or experienced change in No Transfer Assistance: None activities of daily living that may affect Patient Identification Verified: Yes risk of falls: Secondary Verification Process Yes Signs or symptoms of abuse/neglect since last No Completed: visito Patient Has Alerts: Yes Hospitalized since last visit: No Patient Alerts: Patient on Blood Pain Present Now: No Thinner Aspirin 81 mg NO DM Electronic Signature(s) Signed: 06/21/2016 4:53:07 PM By: Alric Quan Entered By: Alric Quan on 06/21/2016 11:13:06 Nathaniel Little ([id number]) -------------------------------------------------------------------------------- Clinic Level of Care Assessment Details Patient Name: Nathaniel Little Date of Service: 06/21/2016 10:45 AM Medical Record Number: [id number] Patient Account Number: [id number] Date of Birth/Sex: 1946/01/19 (70 y.o. Male) Treating RN: Carolyne Fiscal, Debi Primary Care Physician: PATIENT, NO Other Clinician: Referring Physician: Hinda Kehr Treating Physician/Extender: Frann Rider in Treatment: 3 Clinic Level of Care Assessment Items TOOL 4 Quantity Score X - Use when only an EandM is performed on FOLLOW-UP visit 1 0 ASSESSMENTS - Nursing Assessment / Reassessment X -  Reassessment of Co-morbidities (includes updates in patient status) 1 10 X - Reassessment of Adherence to Treatment Plan 1 5 ASSESSMENTS - Wound and Skin Assessment / Reassessment '[]'$  - Simple Wound Assessment / Reassessment - one wound 0 X - Complex Wound Assessment / Reassessment - multiple wounds 2 5 '[]'$  - Dermatologic / Skin Assessment (not related to wound area) 0 ASSESSMENTS - Focused Assessment '[]'$  - Circumferential Edema Measurements - multi extremities 0 '[]'$  - Nutritional Assessment / Counseling / Intervention 0 '[]'$  - Lower Extremity Assessment (monofilament, tuning fork, pulses) 0 '[]'$  - Peripheral Arterial Disease Assessment (using hand held doppler) 0 ASSESSMENTS - Ostomy and/or Continence Assessment and Care '[]'$  - Incontinence Assessment and Management 0 '[]'$  - Ostomy Care Assessment and Management (repouching, etc.) 0 PROCESS - Coordination of Care '[]'$  - Simple Patient / Family Education for ongoing care 0 X - Complex (extensive) Patient / Family Education for ongoing care 1 20 X - Staff obtains Programmer, systems, Records, Test Results / Process Orders 1 10 '[]'$  - Staff telephones HHA, Nursing Homes / Clarify orders / etc 0 '[]'$  - Routine Transfer to another Facility (non-emergent condition) 0 Nathaniel Little, Nathaniel Little ([id number]) '[]'$  - Routine Hospital Admission (non-emergent condition) 0 '[]'$  - New Admissions / Biomedical engineer / Ordering NPWT, Apligraf, etc. 0 '[]'$  - Emergency Hospital Admission (emergent condition) 0 X - Simple Discharge Coordination 1 10 '[]'$  - Complex (extensive) Discharge Coordination 0 PROCESS - Special Needs '[]'$  - Pediatric / Minor Patient Management 0 '[]'$  - Isolation Patient Management 0 '[]'$  - Hearing / Language / Visual special needs 0 '[]'$  - Assessment of Community assistance (transportation, D/C planning, etc.) 0 '[]'$  - Additional assistance / Altered mentation 0 '[]'$  - Support Surface(s) Assessment (bed, cushion, seat, etc.) 0 INTERVENTIONS - Wound Cleansing / Measurement X -  Simple Wound Cleansing - one wound 1 5 '[]'$  - Complex Wound Cleansing - multiple wounds 0 X - Wound Imaging (photographs - any number of wounds) 1  5 '[]'$  - Wound Tracing (instead of photographs) 0 X - Simple Wound Measurement - one wound 1 5 '[]'$  - Complex Wound Measurement - multiple wounds 0 INTERVENTIONS - Wound Dressings X - Small Wound Dressing one or multiple wounds 1 10 '[]'$  - Medium Wound Dressing one or multiple wounds 0 '[]'$  - Large Wound Dressing one or multiple wounds 0 X - Application of Medications - topical 1 5 '[]'$  - Application of Medications - injection 0 INTERVENTIONS - Miscellaneous '[]'$  - External ear exam 0 Nathaniel Little, Nathaniel Little ([id number]) '[]'$  - Specimen Collection (cultures, biopsies, blood, body fluids, etc.) 0 '[]'$  - Specimen(s) / Culture(s) sent or taken to Lab for analysis 0 '[]'$  - Patient Transfer (multiple staff / Harrel Lemon Lift / Similar devices) 0 '[]'$  - Simple Staple / Suture removal (25 or less) 0 '[]'$  - Complex Staple / Suture removal (26 or more) 0 '[]'$  - Hypo / Hyperglycemic Management (close monitor of Blood Glucose) 0 '[]'$  - Ankle / Brachial Index (ABI) - do not check if billed separately 0 X - Vital Signs 1 5 Has the patient been seen at the hospital within the last three years: Yes Total Score: 100 Level Of Care: New/Established - Level 3 Electronic Signature(s) Signed: 06/21/2016 4:53:07 PM By: Alric Quan Entered By: Alric Quan on 06/21/2016 12:08:56 Nathaniel Little ([id number]) -------------------------------------------------------------------------------- Encounter Discharge Information Details Patient Name: Nathaniel Little Date of Service: 06/21/2016 10:45 AM Medical Record Number: [id number] Patient Account Number: [id number] Date of Birth/Sex: 1945-11-25 (70 y.o. Male) Treating RN: Carolyne Fiscal, Debi Primary Care Physician: PATIENT, NO Other Clinician: Referring Physician: Hinda Kehr Treating Physician/Extender: Frann Rider in Treatment: 3 Encounter  Discharge Information Items Discharge Pain Level: 0 Discharge Condition: Stable Ambulatory Status: Cane Discharge Destination: Home Transportation: Other Accompanied By: self Schedule Follow-up Appointment: Yes Medication Reconciliation completed Yes and provided to Patient/Care Keyairra Kolinski: Provided on Clinical Summary of Care: 06/21/2016 Form Type Recipient Paper Patient HB Electronic Signature(s) Signed: 06/21/2016 11:33:40 AM By: Ruthine Dose Entered By: Ruthine Dose on 06/21/2016 11:33:40 Nathaniel Little, Nathaniel Little ([id number]) -------------------------------------------------------------------------------- Lower Extremity Assessment Details Patient Name: Nathaniel Little Date of Service: 06/21/2016 10:45 AM Medical Record Number: [id number] Patient Account Number: [id number] Date of Birth/Sex: 1946/06/17 (69 y.o. Male) Treating RN: Carolyne Fiscal, Debi Primary Care Physician: PATIENT, NO Other Clinician: Referring Physician: Hinda Kehr Treating Physician/Extender: Frann Rider in Treatment: 3 Vascular Assessment Pulses: Posterior Tibial Dorsalis Pedis Palpable: [Left:Yes] Extremity colors, hair growth, and conditions: Extremity Color: [Left:Red] Temperature of Extremity: [Left:Warm] Capillary Refill: [Left:< 3 seconds] Toe Nail Assessment Left: Right: Thick: Yes Discolored: Yes Deformed: Yes Improper Length and Hygiene: Yes Electronic Signature(s) Signed: 06/21/2016 4:53:07 PM By: Alric Quan Entered By: Alric Quan on 06/21/2016 12:08:03 Nathaniel Little ([id number]) -------------------------------------------------------------------------------- Multi Wound Chart Details Patient Name: Nathaniel Little Date of Service: 06/21/2016 10:45 AM Medical Record Number: [id number] Patient Account Number: [id number] Date of Birth/Sex: Jul 12, 1946 (70 y.o. Male) Treating RN: Carolyne Fiscal, Debi Primary Care Physician: PATIENT, NO Other Clinician: Referring Physician: Hinda Kehr Treating Physician/Extender: Frann Rider in Treatment: 3 Vital Signs Height(in): 69 Pulse(bpm): 70 Weight(lbs): 260 Blood Pressure 152/78 (mmHg): Body Mass Index(BMI): 38 Temperature(F): 98.4 Respiratory Rate 18 (breaths/min): Photos: [1:No Photos] [2:No Photos] [N/A:N/A] Wound Location: [1:Left Lower Leg] [2:Left Lower Leg - Inferior] [N/A:N/A] Wounding Event: [1:Trauma] [2:Gradually Appeared] [N/A:N/A] Primary Etiology: [1:Trauma, Other] [2:Trauma, Other] [N/A:N/A] Secondary Etiology: [1:Lymphedema] [2:N/A] [N/A:N/A] Comorbid History: [1:Cataracts, Chronic Obstructive Pulmonary Disease (COPD), Hypertension, Osteoarthritis, Received Radiation] [2:Cataracts, Chronic Obstructive Pulmonary Disease (COPD), Hypertension, Osteoarthritis, Received Radiation] [N/A:N/A] Date Acquired: [1:05/17/2016] [  2:06/03/2016] [N/A:N/A] Weeks of Treatment: [1:3] [2:2] [N/A:N/A] Wound Status: [1:Open] [2:Open] [N/A:N/A] Measurements L x W x D 3.4x0.7x0.2 [2:0.1x0.1x0.1] [N/A:N/A] (cm) Area (cm) : [1:1.869] [2:0.008] [N/A:N/A] Volume (cm) : [1:0.374] [2:0.001] [N/A:N/A] % Reduction in Area: [1:60.30%] [2:97.20%] [N/A:N/A] % Reduction in Volume: 73.60% [2:96.40%] [N/A:N/A] Classification: [1:Full Thickness Without Exposed Support Structures] [2:Partial Thickness] [N/A:N/A] Exudate Amount: [1:Large] [2:None Present] [N/A:N/A] Exudate Type: [1:Serosanguineous] [2:N/A] [N/A:N/A] Exudate Color: [1:red, brown] [2:N/A] [N/A:N/A] Wound Margin: [1:Flat and Intact] [2:Distinct, outline attached] [N/A:N/A] Granulation Amount: [1:Medium (34-66%)] [2:None Present (0%)] [N/A:N/A] Granulation Quality: [1:Red, Pink] [2:N/A] [N/A:N/A] Necrotic Amount: [1:Medium (34-66%)] [2:Large (67-100%)] [N/A:N/A] Necrotic Tissue: Adherent Slough Eschar N/A Exposed Structures: Fascia: No Fascia: No N/A Fat: No Fat: No Tendon: No Tendon: No Muscle: No Muscle: No Joint: No Joint: No Bone: No Bone:  No Limited to Skin Limited to Skin Breakdown Breakdown Epithelialization: Small (1-33%) None N/A Periwound Skin Texture: Induration: Yes No Abnormalities Noted N/A Scarring: Yes Edema: No Excoriation: No Callus: No Crepitus: No Fluctuance: No Friable: No Rash: No Periwound Skin Maceration: No Moist: Yes N/A Moisture: Moist: No Dry/Scaly: No Periwound Skin Color: Erythema: Yes No Abnormalities Noted N/A Atrophie Blanche: No Cyanosis: No Ecchymosis: No Hemosiderin Staining: No Mottled: No Pallor: No Rubor: No Erythema Location: Circumferential N/A N/A Temperature: No Abnormality No Abnormality N/A Tenderness on No Yes N/A Palpation: Wound Preparation: Ulcer Cleansing: Ulcer Cleansing: N/A Rinsed/Irrigated with Rinsed/Irrigated with Saline Saline Topical Anesthetic Topical Anesthetic Applied: Other: liodocaine Applied: Other: lidocaine 4% 4% Treatment Notes Electronic Signature(s) Signed: 06/21/2016 4:53:07 PM By: Alric Quan Entered By: Alric Quan on 06/21/2016 11:20:40 Nathaniel Little, Nathaniel Little ([id number]) -------------------------------------------------------------------------------- Multi-Disciplinary Care Plan Details Patient Name: Nathaniel Little Date of Service: 06/21/2016 10:45 AM Medical Record Number: [id number] Patient Account Number: [id number] Date of Birth/Sex: 29-Oct-1945 (69 y.o. Male) Treating RN: Carolyne Fiscal, Debi Primary Care Physician: PATIENT, NO Other Clinician: Referring Physician: Hinda Kehr Treating Physician/Extender: Frann Rider in Treatment: 3 Active Inactive Abuse / Safety / Falls / Self Care Management Nursing Diagnoses: Potential for falls Goals: Patient will remain injury free Date Initiated: 05/31/2016 Goal Status: Active Interventions: Assess fall risk on admission and as needed Notes: Necrotic Tissue Nursing Diagnoses: Impaired tissue integrity related to necrotic/devitalized tissue Goals: Necrotic/devitalized  tissue will be minimized in the wound bed Date Initiated: 05/31/2016 Goal Status: Active Interventions: Assess patient pain level pre-, during and post procedure and prior to discharge Treatment Activities: Apply topical anesthetic as ordered : 05/31/2016 Notes: Orientation to the Wound Care Program Nursing Diagnoses: Knowledge deficit related to the wound healing center program [place], [place] ([id number]) Goals: Patient/caregiver will verbalize understanding of the [facility] Program Date Initiated: 05/31/2016 Goal Status: Active Interventions: Provide education on orientation to the wound center Notes: Wound/Skin Impairment Nursing Diagnoses: Impaired tissue integrity Knowledge deficit related to ulceration/compromised skin integrity Goals: Patient/caregiver will verbalize understanding of skin care regimen Date Initiated: 05/31/2016 Goal Status: Active Ulcer/skin breakdown will have a volume reduction of 30% by week 4 Date Initiated: 05/31/2016 Goal Status: Active Interventions: Assess patient/caregiver ability to obtain necessary supplies Assess ulceration(s) every visit Treatment Activities: Topical wound management initiated : 05/31/2016 Notes: Electronic Signature(s) Signed: 06/21/2016 4:53:07 PM By: Alric Quan Entered By: Alric Quan on 06/21/2016 11:20:31 Nathaniel Little ([id number]) -------------------------------------------------------------------------------- Pain Assessment Details Patient Name: Nathaniel Little Date of Service: 06/21/2016 10:45 AM Medical Record Number: [id number] Patient Account Number: [id number] Date of Birth/Sex: 04/15/1946 (70 y.o. Male) Treating RN: Carolyne Fiscal, Debi Primary Care Physician: PATIENT, NO Other Clinician: Referring Physician: Hinda Kehr Treating  Physician/Extender: Frann Rider in Treatment: 3 Active Problems Location of Pain Severity and Description of Pain Patient Has Paino No Site Locations With  Dressing Change: No Pain Management and Medication Current Pain Management: Electronic Signature(s) Signed: 06/21/2016 4:53:07 PM By: Alric Quan Entered By: Alric Quan on 06/21/2016 11:13:15 Nathaniel Little ([id number]) -------------------------------------------------------------------------------- Patient/Caregiver Education Details Patient Name: Nathaniel Little Date of Service: 06/21/2016 10:45 AM Medical Record Number: [id number] Patient Account Number: [id number] Date of Birth/Gender: August 13, 1946 (70 y.o. Male) Treating RN: Carolyne Fiscal, Debi Primary Care Physician: PATIENT, NO Other Clinician: Referring Physician: Hinda Kehr Treating Physician/Extender: Frann Rider in Treatment: 3 Education Assessment Education Provided To: Patient Education Topics Provided Wound/Skin Impairment: Handouts: Other: change dressing as ordered Methods: Demonstration, Explain/Verbal Responses: State content correctly Electronic Signature(s) Signed: 06/21/2016 4:53:07 PM By: Alric Quan Entered By: Alric Quan on 06/21/2016 11:28:58 Nathaniel Little ([id number]) -------------------------------------------------------------------------------- Wound Assessment Details Patient Name: Nathaniel Little Date of Service: 06/21/2016 10:45 AM Medical Record Number: [id number] Patient Account Number: [id number] Date of Birth/Sex: 12/09/1945 (70 y.o. Male) Treating RN: Carolyne Fiscal, Debi Primary Care Physician: PATIENT, NO Other Clinician: Referring Physician: Hinda Kehr Treating Physician/Extender: Frann Rider in Treatment: 3 Wound Status Wound Number: 1 Primary Trauma, Other Etiology: Wound Location: Left Lower Leg Secondary Lymphedema Wounding Event: Trauma Etiology: Date Acquired: 05/17/2016 Wound Open Weeks Of Treatment: 3 Status: Clustered Wound: No Comorbid Cataracts, Chronic Obstructive History: Pulmonary Disease (COPD), Hypertension, Osteoarthritis,  Received Radiation Photos Photo Uploaded By: Alric Quan on 06/21/2016 13:52:27 Wound Measurements Length: (cm) 3.4 Width: (cm) 0.7 Depth: (cm) 0.2 Area: (cm) 1.869 Volume: (cm) 0.374 % Reduction in Area: 60.3% % Reduction in Volume: 73.6% Epithelialization: Small (1-33%) Tunneling: No Undermining: No Wound Description Full Thickness Without Exposed Classification: Support Structures Wound Margin: Flat and Intact Exudate Large Amount: Exudate Type: Serosanguineous Exudate Color: red, brown Nathaniel Little, Nathaniel Little ([id number]) Foul Odor After Cleansing: No Wound Bed Granulation Amount: Medium (34-66%) Exposed Structure Granulation Quality: Red, Pink Fascia Exposed: No Necrotic Amount: Medium (34-66%) Fat Layer Exposed: No Necrotic Quality: Adherent Slough Tendon Exposed: No Muscle Exposed: No Joint Exposed: No Bone Exposed: No Limited to Skin Breakdown Periwound Skin Texture Texture Color No Abnormalities Noted: No No Abnormalities Noted: No Callus: No Atrophie Blanche: No Crepitus: No Cyanosis: No Excoriation: No Ecchymosis: No Fluctuance: No Erythema: Yes Friable: No Erythema Location: Circumferential Induration: Yes Hemosiderin Staining: No Localized Edema: No Mottled: No Rash: No Pallor: No Scarring: Yes Rubor: No Moisture Temperature / Pain No Abnormalities Noted: No Temperature: No Abnormality Dry / Scaly: No Maceration: No Moist: No Wound Preparation Ulcer Cleansing: Rinsed/Irrigated with Saline Topical Anesthetic Applied: Other: liodocaine 4%, Treatment Notes Wound #1 (Left Lower Leg) 1. Cleansed with: Clean wound with Normal Saline 2. Anesthetic Topical Lidocaine 4% cream to wound bed prior to debridement 4. Dressing Applied: Aquacel Ag 5. Secondary Dressing Applied Dry [place] Signature(s) Signed: 06/21/2016 4:53:07 PM By: Sharyne Peach, Nathaniel Little ([id number]) Entered By: Alric Quan on  06/21/2016 11:19:53 Nathaniel Little ([id number]) -------------------------------------------------------------------------------- Wound Assessment Details Patient Name: Nathaniel Little Date of Service: 06/21/2016 10:45 AM Medical Record Number: [id number] Patient Account Number: [id number] Date of Birth/Sex: [birth year] (69 y.o. Male) Treating RN: Carolyne Fiscal, Debi Primary Care Physician: PATIENT, NO Other Clinician: Referring Physician: Hinda Kehr Treating Physician/Extender: Frann Rider in Treatment: 3 Wound Status Wound Number: 2 Primary Trauma, Other Etiology: Wound Location: Left Lower Leg - Inferior Wound Open Wounding Event: Gradually Appeared Status: Date Acquired: 06/03/2016 Comorbid Cataracts, Chronic Obstructive Weeks Of Treatment: 2 History:  Pulmonary Disease (COPD), Clustered Wound: No Hypertension, Osteoarthritis, Received Radiation Photos Photo Uploaded By: Alric Quan on 06/21/2016 13:52:28 Wound Measurements Length: (cm) Width: (cm) Depth: (cm) Area: (cm) Volume: (cm) 0 % Reduction in Area: 100% 0 % Reduction in Volume: 100% 0 Epithelialization: Large (67-100%) 0 Tunneling: No 0 Undermining: No Wound Description Classification: Partial Thickness Wound Margin: Distinct, outline attached Exudate Amount: None Present Foul Odor After Cleansing: No Wound Bed Granulation Amount: Large (67-100%) Exposed Structure Granulation Quality: Red Fascia Exposed: No Necrotic Amount: None Present (0%) Fat Layer Exposed: No Tendon Exposed: No Muscle Exposed: No Nathaniel Little, Nathaniel Little ([id number]) Joint Exposed: No Bone Exposed: No Limited to Skin Breakdown Periwound Skin Texture Texture Color No Abnormalities Noted: No No Abnormalities Noted: No Moisture Temperature / Pain No Abnormalities Noted: No Temperature: No Abnormality Moist: No Tenderness on Palpation: Yes Wound Preparation Ulcer Cleansing: Rinsed/Irrigated with Saline Topical Anesthetic  Applied: None Electronic Signature(s) Signed: 06/21/2016 4:53:07 PM By: Alric Quan Entered By: Alric Quan on 06/21/2016 11:27:21 Nathaniel Little ([id number]) -------------------------------------------------------------------------------- Vitals Details Patient Name: Nathaniel Little Date of Service: 06/21/2016 10:45 AM Medical Record Number: [id number] Patient Account Number: [id number] Date of Birth/Sex: March 15, 1946 (70 y.o. Male) Treating RN: Carolyne Fiscal, Debi Primary Care Physician: PATIENT, NO Other Clinician: Referring Physician: Hinda Kehr Treating Physician/Extender: Frann Rider in Treatment: 3 Vital Signs Time Taken: 11:13 Temperature (F): 98.4 Height (in): 69 Pulse (bpm): 70 Weight (lbs): 260 Respiratory Rate (breaths/min): 18 Body Mass Index (BMI): 38.4 Blood Pressure (mmHg): 152/78 Reference Range: 80 - 120 mg / dl Electronic Signature(s) Signed: 06/21/2016 4:53:07 PM By: Alric Quan Entered By: Alric Quan on 06/21/2016 11:14:52

## 2016-06-28 ENCOUNTER — Ambulatory Visit: Payer: Medicare Other | Admitting: Surgery

## 2016-07-05 ENCOUNTER — Encounter: Payer: Commercial Managed Care - HMO | Attending: Nurse Practitioner | Admitting: Nurse Practitioner

## 2016-07-05 DIAGNOSIS — Z87891 Personal history of nicotine dependence: Secondary | ICD-10-CM | POA: Insufficient documentation

## 2016-07-05 DIAGNOSIS — S81812A Laceration without foreign body, left lower leg, initial encounter: Secondary | ICD-10-CM | POA: Diagnosis not present

## 2016-07-05 DIAGNOSIS — L97222 Non-pressure chronic ulcer of left calf with fat layer exposed: Secondary | ICD-10-CM | POA: Insufficient documentation

## 2016-07-05 DIAGNOSIS — X58XXXA Exposure to other specified factors, initial encounter: Secondary | ICD-10-CM | POA: Insufficient documentation

## 2016-07-05 DIAGNOSIS — I890 Lymphedema, not elsewhere classified: Secondary | ICD-10-CM | POA: Insufficient documentation

## 2016-07-05 DIAGNOSIS — J449 Chronic obstructive pulmonary disease, unspecified: Secondary | ICD-10-CM | POA: Insufficient documentation

## 2016-07-05 DIAGNOSIS — Z6838 Body mass index (BMI) 38.0-38.9, adult: Secondary | ICD-10-CM | POA: Insufficient documentation

## 2016-07-05 DIAGNOSIS — I10 Essential (primary) hypertension: Secondary | ICD-10-CM | POA: Diagnosis not present

## 2016-07-06 NOTE — Progress Notes (Addendum)
LINC, RENNE ([id number]) Visit Report for 07/05/2016 Chief Complaint Document Details Patient Name: Nathaniel Little, Nathaniel Little Date of Service: 07/05/2016 10:45 AM Medical Record Number: [id number] Patient Account Number: [id number] Date of Birth/Sex: 04-11-1946 (70 y.o. Male) Treating RN: Cornell Barman Primary Care Physician: PATIENT, NO Other Clinician: Referring Physician: Hinda Kehr Treating Physician/Extender: Loistine Chance in Treatment: 5 Information Obtained from: Patient Chief Complaint Patient seen for complaints of Non-Healing Wound for his left lower extremity Electronic Signature(s) Signed: 07/05/2016 4:03:42 PM By: Londell Moh FNP Entered By: Londell Moh on 07/05/2016 11:11:33 Nathaniel Little ([id number]) -------------------------------------------------------------------------------- Debridement Details Patient Name: Nathaniel Little Date of Service: 07/05/2016 10:45 AM Medical Record Number: [id number] Patient Account Number: [id number] Date of Birth/Sex: Aug 21, 1946 (69 y.o. Male) Treating RN: Cornell Barman Primary Care Physician: PATIENT, NO Other Clinician: Referring Physician: Hinda Kehr Treating Physician/Extender: Loistine Chance in Treatment: 5 Debridement Performed for Wound #1 Left Lower Leg Assessment: Performed By: Physician Londell Moh, NP Debridement: Open Wound/Selective Debridement Selective Description: Pre-procedure Yes - 11:00 Verification/Time Out Taken: Start Time: 11:01 Pain Control: Other : lidocaine 4% Level: Non-Viable Tissue Total Area Debrided (L x 0.7 (cm) x 0.5 (cm) = 0.35 (cm) W): Tissue and other Non-Viable, Eschar, Exudate material debrided: Instrument: Blade Bleeding: None End Time: 11:05 Procedural Pain: 0 Post Procedural Pain: 0 Response to Treatment: Procedure was tolerated well Post Debridement Measurements of Total Wound Length: (cm) 0.7 Width: (cm) 0.5 Depth: (cm) 0.1 Volume: (cm)  0.027 Character of Wound/Ulcer Post Improved Debridement: Severity of Tissue Post Debridement: Fat layer exposed Post Procedure Diagnosis Same as Pre-procedure Electronic Signature(s) Signed: 07/05/2016 4:03:42 PM By: Londell Moh FNP Signed: 07/05/2016 5:16:12 PM By: Gretta Cool RN, BSN, Kim RN, BSN Entered By: Gretta Cool, RN, BSN, Kim on 07/05/2016 11:06:12 Nathaniel Little, Nathaniel Little ([id number]) Nathaniel Little, Nathaniel Little ([id number]) -------------------------------------------------------------------------------- HPI Details Patient Name: Nathaniel Little Date of Service: 07/05/2016 10:45 AM Medical Record Number: [id number] Patient Account Number: [id number] Date of Birth/Sex: 1946/09/04 (70 y.o. Male) Treating RN: Cornell Barman Primary Care Physician: PATIENT, NO Other Clinician: Referring Physician: Hinda Kehr Treating Physician/Extender: Loistine Chance in Treatment: 5 History of Present Illness Location: left lower extremity Quality: Patient reports experiencing a sharp pain to affected area(s). Severity: Patient states wound are getting worse. Duration: Patient has had the wound for < 2 weeks prior to presenting for treatment Timing: Pain in wound is constant (hurts all the time) Context: The wound occurred when the patient a piece of plywood lacerated wound on his left lower extremity on the shin Modifying Factors: Consults to this date include:Keflex and Bactrim Associated Signs and Symptoms: Patient reports having increase swelling. HPI Description: 70 year old gentleman who presented to the ER a week ago with a history of having injury to his left leg with a piece of plywood which lacerated his leg. Past medical system history significant for COPD and hypertension.he is a former smoker who quit smoking recently. the ER he was found to have a vertical laceration on the left lower extremity which is foul-smelling and had no abscess or discharge.an x-ray was done which was unremarkable. report  reviewed -- Mild soft tissue edema is noted consistent with the given clinical history. No definitive radiopaque foreign body is seen. No bony abnormality is noted. He was started on Keflex 500 mg twice daily and Bactrim DS 1 tablet twice daily. He was seen back in the ER on 05/26/2016 and was found to have good progression of healing and no generalized cellulitis and did not warrant inpatient admission. 06/04/2016 -- the patient  has not followed any of her instructions he saw him last week and has been doing dry dressings of his wound only. 06/14/2016 -- not yet got his compression stockings and he is using Medihoney pads. 06/21/2016 -- as he soiled his compression stockings but other than that has been using them regularly 07/05/16: returns today for ongoing eval and management of a left lower extremity wound. denies pain or discomfort associated with his wound. no systemic s/s of infection. Electronic Signature(s) Signed: 07/05/2016 4:03:42 PM By: Londell Moh FNP Entered By: Londell Moh on 07/05/2016 11:12:24 Nathaniel Little, Nathaniel Little ([id number]) -------------------------------------------------------------------------------- Physical Exam Details Patient Name: Nathaniel Little Date of Service: 07/05/2016 10:45 AM Medical Record Number: [id number] Patient Account Number: [id number] Date of Birth/Sex: 11/10/1945 (70 y.o. Male) Treating RN: Cornell Barman Primary Care Physician: PATIENT, NO Other Clinician: Referring Physician: Hinda Kehr Treating Physician/Extender: Loistine Chance in Treatment: 5 Constitutional Patient's appearance is neat and clean. Appears in no acute distress. obese and well developed.. Ears, Nose, Mouth, and Throat Patient can hear normal speaking tones without difficulty.[name] Respiratory Respiratory effort is easy and symmetric bilaterally. Rate is normal at rest and on room air.. Cardiovascular Pedal pulses palpable and strong bilaterally.. Extremities are free  of varicosities, clubbing or edema. Peripheral pulses strong and equal. Capillary refill < 3 seconds.. Psychiatric Judgement and insight intact.. Alert and oriented times 3.. Short and long term memory intact.. No evidence of depression, anxiety, or agitation. Calm, cooperative, and communicative. Appropriate interactions and affect.. Electronic Signature(s) Signed: 07/05/2016 4:03:42 PM By: Londell Moh FNP Entered By: Londell Moh on 07/05/2016 11:12:59 Nathaniel Little ([id number]) -------------------------------------------------------------------------------- Physician Orders Details Patient Name: Nathaniel Little Date of Service: 07/05/2016 10:45 AM Medical Record Number: [id number] Patient Account Number: [id number] Date of Birth/Sex: [birth year] (70 y.o. Male) Treating RN: Cornell Barman Primary Care Physician: PATIENT, NO Other Clinician: Referring Physician: Hinda Kehr Treating Physician/Extender: Loistine Chance in Treatment: 5 Verbal / Phone Orders: Yes Clinician: Cornell Barman Read Back and Verified: Yes Diagnosis Coding Wound Cleansing Wound #1 Left Lower Leg o Clean wound with Normal Saline. o May Shower, gently pat wound dry prior to applying new dressing. Anesthetic Wound #1 Left Lower Leg o Topical Lidocaine 4% cream applied to wound bed prior to debridement - in clinic only Primary Wound Dressing Wound #1 Left Lower Leg o Aquacel Ag Secondary Dressing Wound #1 Left Lower Leg o Dry Gauze o Boardered Foam Dressing - [place] Dressing Change Frequency Wound #1 Left Lower Leg o Change dressing every day. Follow-up Appointments Wound #1 Left Lower Leg o Other: - Friday Edema Control Wound #1 Left Lower Leg o Patient to wear own compression stockings o Elevate legs to the level of the heart and pump ankles as often as possible Electronic Signature(s) Signed: 07/05/2016 4:03:42 PM By: Londell Moh FNP Signed: 07/05/2016 5:16:12  PM By: Gretta Cool RN, BSN, Kim RN, BSN [place], Kyshaun ([id number]) Entered By: Gretta Cool, RN, BSN, Kim on 07/05/2016 11:07:21 Nathaniel Little, Nathaniel Little ([id number]) -------------------------------------------------------------------------------- Problem List Details Patient Name: Nathaniel Little Date of Service: 07/05/2016 10:45 AM Medical Record Number: [id number] Patient Account Number: [id number] Date of Birth/Sex: May 18, 1946 (70 y.o. Male) Treating RN: Cornell Barman Primary Care Physician: PATIENT, NO Other Clinician: Referring Physician: Hinda Kehr Treating Physician/Extender: Loistine Chance in Treatment: 5 Active Problems ICD-10 Encounter Code Description Active Date Diagnosis S81.812A Laceration without foreign body, left lower leg, initial 05/31/2016 Yes encounter L97.222 Non-pressure chronic ulcer of left calf with fat layer 05/31/2016 Yes exposed I89.0 Lymphedema, not elsewhere classified 05/31/2016  Yes [id number] Personal history of nicotine dependence 05/31/2016 Yes E66.01 Morbid (severe) obesity due to excess calories 05/31/2016 Yes Inactive Problems Resolved Problems Electronic Signature(s) Signed: 07/05/2016 4:03:42 PM By: Londell Moh FNP Entered By: Londell Moh on 07/05/2016 11:11:08 Nathaniel Little ([id number]) -------------------------------------------------------------------------------- Progress Note Details Patient Name: Nathaniel Little Date of Service: 07/05/2016 10:45 AM Medical Record Number: [id number] Patient Account Number: [id number] Date of Birth/Sex: [birth year] (70 y.o. Male) Treating RN: Cornell Barman Primary Care Physician: PATIENT, NO Other Clinician: Referring Physician: Hinda Kehr Treating Physician/Extender: Loistine Chance in Treatment: 5 Subjective Chief Complaint Information obtained from Patient Patient seen for complaints of Non-Healing Wound for his left lower extremity History of Present Illness (HPI) The following HPI elements were  documented for the patient's wound: Location: left lower extremity Quality: Patient reports experiencing a sharp pain to affected area(s). Severity: Patient states wound are getting worse. Duration: Patient has had the wound for < 2 weeks prior to presenting for treatment Timing: Pain in wound is constant (hurts all the time) Context: The wound occurred when the patient a piece of plywood lacerated wound on his left lower extremity on the shin Modifying Factors: Consults to this date include:Keflex and Bactrim Associated Signs and Symptoms: Patient reports having increase swelling. 70 year old gentleman who presented to the ER a week ago with a history of having injury to his left leg with a piece of plywood which lacerated his leg. Past medical system history significant for COPD and hypertension.he is a former smoker who quit smoking recently. the ER he was found to have a vertical laceration on the left lower extremity which is foul-smelling and had no abscess or discharge.an x-ray was done which was unremarkable. report reviewed -- Mild soft tissue edema is noted consistent with the given clinical history. No definitive radiopaque foreign body is seen. No bony abnormality is noted. He was started on Keflex 500 mg twice daily and Bactrim DS 1 tablet twice daily. He was seen back in the ER on 05/26/2016 and was found to have good progression of healing and no generalized cellulitis and did not warrant inpatient admission. 06/04/2016 -- the patient has not followed any of her instructions he saw him last week and has been doing dry dressings of his wound only. 06/14/2016 -- not yet got his compression stockings and he is using Medihoney pads. 06/21/2016 -- as he soiled his compression stockings but other than that has been using them regularly 07/05/16: returns today for ongoing eval and management of a left lower extremity wound. denies pain or discomfort associated with his wound. no  systemic s/s of infection. Nathaniel Little, Nathaniel Little ([id number]) Objective Constitutional Patient's appearance is neat and clean. Appears in no acute distress. obese and well developed.. Vitals Time Taken: 10:50 AM, Height: 69 in, Weight: 260 lbs, BMI: 38.4, Temperature: 98.3 F, Pulse: 71 bpm, Respiratory Rate: 18 breaths/min, Blood Pressure: 168/78 mmHg. Ears, Nose, Mouth, and Throat Patient can hear normal speaking tones without difficulty.[name] Respiratory Respiratory effort is easy and symmetric bilaterally. Rate is normal at rest and on room air.. Cardiovascular Pedal pulses palpable and strong bilaterally.. Extremities are free of varicosities, clubbing or edema. Peripheral pulses strong and equal. Capillary refill < 3 seconds.. Psychiatric Judgement and insight intact.. Alert and oriented times 3.. Short and long term memory intact.. No evidence of depression, anxiety, or agitation. Calm, cooperative, and communicative. Appropriate interactions and affect.. Integumentary (Hair, Skin) Wound #1 status is Open. Original cause of wound was Trauma. The wound is located on the  Left Lower Leg. The wound measures 0.7cm length x 0.5cm width x 0.1cm depth; 0.275cm^2 area and 0.027cm^3 volume. The wound is limited to skin breakdown. There is no tunneling or undermining noted. There is a large amount of serosanguineous drainage noted. The wound margin is flat and intact. There is no granulation within the wound bed. There is a large (67-100%) amount of necrotic tissue within the wound bed including Eschar. The periwound skin appearance exhibited: Induration, Scarring. The periwound skin appearance did not exhibit: Callus, Crepitus, Excoriation, Fluctuance, Friable, Localized Edema, Rash, Dry/Scaly, Maceration, Moist, Atrophie Blanche, Cyanosis, Ecchymosis, Hemosiderin Staining, Mottled, Pallor, Rubor, Erythema. Periwound temperature was noted as No Abnormality. Assessment Active Problems ICD-10 S81.812A  - Laceration without foreign body, left lower leg, initial encounter L97.222 - Non-pressure chronic ulcer of left calf with fat layer exposed I89.0 - Lymphedema, not elsewhere classified Z87.891 - Personal history of nicotine dependence Nathaniel Little, Nathaniel Little ([id number]) E66.01 - Morbid (severe) obesity due to excess calories Diagnoses ICD-10 S81.812A: Laceration without foreign body, left lower leg, initial encounter O24.235: Non-pressure chronic ulcer of left calf with fat layer exposed I89.0: Lymphedema, not elsewhere classified Z87.891: Personal history of nicotine dependence E66.01: Morbid (severe) obesity due to excess calories Procedures Wound #1 Wound #1 is a Trauma, Other located on the Left Lower Leg . There was a Non-Viable Tissue Open Wound/Selective [phone]) debridement with total area of 0.35 sq cm performed by Londell Moh, NP. with the following instrument(s): Blade to remove Non-Viable tissue/material including Exudate and Eschar after achieving pain control using Other (lidocaine 4%). A time out was conducted at 11:00, prior to the start of the procedure. There was no bleeding. The procedure was tolerated well with a pain level of 0 throughout and a pain level of 0 following the procedure. Post Debridement Measurements: 0.7cm length x 0.5cm width x 0.1cm depth; 0.027cm^3 volume. Character of Wound/Ulcer Post Debridement is improved. Severity of Tissue Post Debridement is: Fat layer exposed. Post procedure Diagnosis Wound #1: Same as Pre-Procedure Plan Wound Cleansing: Wound #1 Left Lower Leg: Clean wound with Normal Saline. May Shower, gently pat wound dry prior to applying new dressing. Anesthetic: Wound #1 Left Lower Leg: Topical Lidocaine 4% cream applied to wound bed prior to debridement - in clinic only Primary Wound Dressing: Wound #1 Left Lower Leg: Aquacel Ag Secondary Dressing: Wound #1 Left Lower Leg: Dry Gauze Boardered Foam Dressing - [employer] ([id number]) Dressing Change Frequency: Wound #1 Left Lower Leg: Change dressing every day. Follow-up Appointments: Wound #1 Left Lower Leg: Other: - Friday Edema Control: Wound #1 Left Lower Leg: Patient to wear own compression stockings Elevate legs to the level of the heart and pump ankles as often as possible Follow-Up Appointments: A follow-up appointment should be scheduled. Medication Reconciliation completed and provided to Patient/Care Provider. 1. discussed clinical findings and implications with pt. all questions were answered. 2. wound is improving. no s/s of infection. Electronic Signature(s) Signed: 07/28/2016 4:13:02 PM By: Londell Moh FNP Previous Signature: 07/05/2016 4:03:42 PM Version By: Londell Moh FNP Entered By: Londell Moh on 07/28/2016 16:05:18 Nathaniel Little ([id number]) -------------------------------------------------------------------------------- SuperBill Details Patient Name: Nathaniel Little Date of Service: 07/05/2016 Medical Record Number: [id number] Patient Account Number: [id number] Date of Birth/Sex: 07/04/1946 (69 y.o. Male) Treating RN: Cornell Barman Primary Care Physician: PATIENT, NO Other Clinician: Referring Physician: Hinda Kehr Treating Physician/Extender: Loistine Chance in Treatment: 5 Diagnosis Coding ICD-10 Codes Code Description [phone] Laceration without foreign body, left lower leg, initial encounter L97.222 Non-pressure  chronic ulcer of left calf with fat layer exposed I89.0 Lymphedema, not elsewhere classified Z87.891 Personal history of nicotine dependence E66.01 Morbid (severe) obesity due to excess calories Facility Procedures CPT4 Code Description: [id number] - DEBRIDE WOUND 1ST 20 SQ CM OR < ICD-10 Description Diagnosis S81.812A Laceration without foreign body, left lower leg, ini L97.222 Non-pressure chronic ulcer of left calf with fat lay Modifier: tial encounter er  exposed Quantity: 1 Physician Procedures CPT4 Code Description: [id number] - WC PHYS DEBR WO ANESTH 20 SQ CM ICD-10 Description Diagnosis S81.812A Laceration without foreign body, left lower leg, init L97.222 Non-pressure chronic ulcer of left calf with fat laye Modifier: ial encounter r exposed Quantity: 1 Electronic Signature(s) Signed: 07/05/2016 4:03:42 PM By: Londell Moh FNP Entered By: Londell Moh on 07/05/2016 11:13:59

## 2016-07-06 NOTE — Progress Notes (Signed)
Nathaniel Little, Nathaniel Little ([id number]) Visit Report for 07/05/2016 Arrival Information Details Patient Name: Nathaniel, Little Date of Service: 07/05/2016 10:45 AM Medical Record Number: [id number] Patient Account Number: [id number] Date of Birth/Sex: Feb 15, 1946 (70 y.o. Male) Treating Little: Nathaniel Little Primary Care Physician: PATIENT, NO Other Clinician: Referring Physician: Hinda Little Treating Physician/Extender: Nathaniel Little in Treatment: 5 Visit Information History Since Last Visit Added or deleted any medications: No Patient Arrived: Cane Any new allergies or adverse reactions: No Arrival Time: 10:48 Had a fall or experienced change in No Accompanied By: self activities of daily living that may affect Transfer Assistance: None risk of falls: Patient Identification Verified: Yes Signs or symptoms of abuse/neglect since last No Secondary Verification Process Yes visito Completed: Hospitalized since last visit: No Patient Has Alerts: Yes Has Dressing in Place as Prescribed: Yes Patient Alerts: Patient on Blood Pain Present Now: No Thinner Aspirin 81 mg NO DM Electronic Signature(s) Signed: 07/05/2016 5:16:12 PM By: Nathaniel Little Entered By: Nathaniel Cool, Little, Little, Nathaniel on 07/05/2016 10:50:18 Nathaniel Little ([id number]) -------------------------------------------------------------------------------- Encounter Discharge Information Details Patient Name: Nathaniel Little Date of Service: 07/05/2016 10:45 AM Medical Record Number: [id number] Patient Account Number: [id number] Date of Birth/Sex: [birth year] (69 y.o. Male) Treating Little: Nathaniel Little Primary Care Physician: PATIENT, NO Other Clinician: Referring Physician: Hinda Little Treating Physician/Extender: Nathaniel Little in Treatment: 5 Encounter Discharge Information Items Discharge Pain Level: 0 Discharge Condition: Stable Ambulatory Status: Cane Discharge Destination:  Home Private Transportation: Auto Accompanied By: self Schedule Follow-up Appointment: Yes Medication Reconciliation completed and Yes provided to Patient/Care Nathaniel Little: Clinical Summary of Care: Electronic Signature(s) Signed: 07/05/2016 5:16:12 PM By: Nathaniel Cool Little, Little, Nathaniel Little, Little Previous Signature: 07/05/2016 11:10:59 AM Version By: Nathaniel Little Entered By: Nathaniel Cool Little, Little, Nathaniel on 07/05/2016 11:11:08 Nathaniel Little ([id number]) -------------------------------------------------------------------------------- Lower Extremity Assessment Details Patient Name: Nathaniel Little Date of Service: 07/05/2016 10:45 AM Medical Record Number: [id number] Patient Account Number: [id number] Date of Birth/Sex: [birth year] (69 y.o. Male) Treating Little: Nathaniel Little Primary Care Physician: PATIENT, NO Other Clinician: Referring Physician: Hinda Little Treating Physician/Extender: Nathaniel Little in Treatment: 5 Vascular Assessment Pulses: Posterior Tibial Extremity colors, hair growth, and conditions: Extremity Color: [Left:Normal] Hair Growth on Extremity: [Left:Yes] Temperature of Extremity: [Left:Warm] Capillary Refill: [Left:< 3 seconds] Dependent Rubor: [Left:No] Blanched when Elevated: [Left:No] Lipodermatosclerosis: [Left:No] Toe Nail Assessment Left: Right: Thick: Yes Discolored: No Deformed: No Improper Length and Hygiene: No Electronic Signature(s) Signed: 07/05/2016 5:16:12 PM By: Nathaniel Little Entered By: Nathaniel Cool, Little, Little, Nathaniel on 07/05/2016 10:54:38 Nathaniel Little ([id number]) -------------------------------------------------------------------------------- Multi Wound Chart Details Patient Name: Nathaniel Little Date of Service: 07/05/2016 10:45 AM Medical Record Number: [id number] Patient Account Number: [id number] Date of Birth/Sex: 07/31/1946 (70 y.o. Male) Treating Little: Nathaniel Little Primary Care Physician: PATIENT, NO Other Clinician: Referring Physician: Hinda Little Treating Physician/Extender: Nathaniel Little in Treatment: 5 Vital Signs Height(in): 69 Pulse(bpm): 71 Weight(lbs): 260 Blood Pressure 168/78 (mmHg): Body Mass Index(BMI): 38 Temperature(F): 98.3 Respiratory Rate 18 (breaths/min): Photos: [N/A:N/A] Wound Location: Left Lower Leg N/A N/A Wounding Event: Trauma N/A N/A Primary Etiology: Trauma, Other N/A N/A Secondary Etiology: Lymphedema N/A N/A Comorbid History: Cataracts, Chronic N/A N/A Obstructive Pulmonary Disease (COPD), Hypertension, Osteoarthritis, Received Radiation Date Acquired: 05/17/2016 N/A N/A Weeks of Treatment: 5 N/A N/A Wound Status: Open N/A N/A Measurements L x W x D 1.5x0.5x0.1 N/A N/A (cm) Area (cm) : 0.589 N/A N/A Volume (cm) : 0.059 N/A N/A % Reduction in Area: 87.50% N/A N/A %  Reduction in Volume: 95.80% N/A N/A Classification: Full Thickness Without N/A N/A Exposed Support Structures Exudate Amount: Large N/A N/A Exudate Type: Serosanguineous N/A N/A Exudate Color: red, brown N/A N/A [name], [name] ([id number]) Wound Margin: Flat and Intact N/A N/A Granulation Amount: None Present (0%) N/A N/A Necrotic Amount: Large (67-100%) N/A N/A Necrotic Tissue: Eschar N/A N/A Exposed Structures: Fascia: No N/A N/A Fat: No Tendon: No Muscle: No Joint: No Bone: No Limited to Skin Breakdown Epithelialization: Large (67-100%) N/A N/A Periwound Skin Texture: Induration: Yes N/A N/A Scarring: Yes Edema: No Excoriation: No Callus: No Crepitus: No Fluctuance: No Friable: No Rash: No Periwound Skin Maceration: No N/A N/A Moisture: Moist: No Dry/Scaly: No Periwound Skin Color: Atrophie Blanche: No N/A N/A Cyanosis: No Ecchymosis: No Erythema: No Hemosiderin Staining: No Mottled: No Pallor: No Rubor: No Temperature: No Abnormality N/A N/A Tenderness on No N/A N/A Palpation: Wound Preparation: Ulcer Cleansing: N/A N/A Rinsed/Irrigated with Saline Topical  Anesthetic Applied: None Treatment Notes Electronic Signature(s) Signed: 07/05/2016 5:16:12 PM By: Nathaniel Little Entered By: Nathaniel Cool, Little, Little, Nathaniel on 07/05/2016 10:58:34 Nathaniel, Little ([id number]) Nathaniel, Little ([id number]) -------------------------------------------------------------------------------- Multi-Disciplinary Care Plan Details Patient Name: Nathaniel Little Date of Service: 07/05/2016 10:45 AM Medical Record Number: [id number] Patient Account Number: [id number] Date of Birth/Sex: 1946/07/22 (69 y.o. Male) Treating Little: Nathaniel Little Primary Care Physician: PATIENT, NO Other Clinician: Referring Physician: Hinda Little Treating Physician/Extender: Nathaniel Little in Treatment: 5 Active Inactive Abuse / Safety / Falls / Self Care Management Nursing Diagnoses: Potential for falls Goals: Patient will remain injury free Date Initiated: 05/31/2016 Goal Status: Active Interventions: Assess fall risk on admission and as needed Notes: Necrotic Tissue Nursing Diagnoses: Impaired tissue integrity related to necrotic/devitalized tissue Goals: Necrotic/devitalized tissue will be minimized in the wound bed Date Initiated: 05/31/2016 Goal Status: Active Interventions: Assess patient pain level pre-, during and post procedure and prior to discharge Treatment Activities: Apply topical anesthetic as ordered : 05/31/2016 Notes: Orientation to the Wound Care Program Nursing Diagnoses: Knowledge deficit related to the wound healing center program MONTRELL, CESSNA ([id number]) Goals: Patient/caregiver will verbalize understanding of the [place] Program Date Initiated: 05/31/2016 Goal Status: Active Interventions: Provide education on orientation to the wound center Notes: Wound/Skin Impairment Nursing Diagnoses: Impaired tissue integrity Knowledge deficit related to ulceration/compromised skin integrity Goals: Patient/caregiver will verbalize  understanding of skin care regimen Date Initiated: 05/31/2016 Goal Status: Active Ulcer/skin breakdown will have a volume reduction of 30% by week 4 Date Initiated: 05/31/2016 Goal Status: Active Interventions: Assess patient/caregiver ability to obtain necessary supplies Assess ulceration(s) every visit Treatment Activities: Topical wound management initiated : 05/31/2016 Notes: Electronic Signature(s) Signed: 07/05/2016 5:16:12 PM By: Nathaniel Little Entered By: Nathaniel Cool, Little, Little, Nathaniel on 07/05/2016 10:58:25 Nathaniel Little ([id number]) -------------------------------------------------------------------------------- Pain Assessment Details Patient Name: Nathaniel Little Date of Service: 07/05/2016 10:45 AM Medical Record Number: [id number] Patient Account Number: [id number] Date of Birth/Sex: 1946/02/01 (69 y.o. Male) Treating Little: Nathaniel Little Primary Care Physician: PATIENT, NO Other Clinician: Referring Physician: Hinda Little Treating Physician/Extender: Nathaniel Little in Treatment: 5 Active Problems Location of Pain Severity and Description of Pain Patient Has Paino No Site Locations With Dressing Change: No Pain Management and Medication Current Pain Management: Electronic Signature(s) Signed: 07/05/2016 5:16:12 PM By: Nathaniel Little Entered By: Nathaniel Cool, Little, Little, Nathaniel on 07/05/2016 10:50:24 Nathaniel Little ([id number]) -------------------------------------------------------------------------------- Patient/Caregiver Education Details Patient Name: Nathaniel Little Date of Service: 07/05/2016 10:45 AM Medical Record Number:  [id number] Patient Account Number: [id number] Date of Birth/Gender: [birth year] (70 y.o. Male) Treating Little: Nathaniel Little Primary Care Physician: PATIENT, NO Other Clinician: Referring Physician: Hinda Little Treating Physician/Extender: Nathaniel Little in Treatment: 5 Education Assessment Education Provided To: Patient Education  Topics Provided Wound/Skin Impairment: Handouts: Caring for Your Ulcer, Other: continue wound care as prescribed Methods: Demonstration Responses: State content correctly Electronic Signature(s) Signed: 07/05/2016 5:16:12 PM By: Nathaniel Little Entered By: Nathaniel Cool, Little, Little, Nathaniel on 07/05/2016 11:11:31 Nathaniel Little ([id number]) -------------------------------------------------------------------------------- Wound Assessment Details Patient Name: Nathaniel Little Date of Service: 07/05/2016 10:45 AM Medical Record Number: [id number] Patient Account Number: [id number] Date of Birth/Sex: [birth year] (69 y.o. Male) Treating Little: Nathaniel Little Primary Care Physician: PATIENT, NO Other Clinician: Referring Physician: Hinda Little Treating Physician/Extender: Nathaniel Little in Treatment: 5 Wound Status Wound Number: 1 Primary Trauma, Other Etiology: Wound Location: Left Lower Leg Secondary Lymphedema Wounding Event: Trauma Etiology: Date Acquired: 05/17/2016 Wound Open Weeks Of Treatment: 5 Status: Clustered Wound: No Comorbid Cataracts, Chronic Obstructive History: Pulmonary Disease (COPD), Hypertension, Osteoarthritis, Received Radiation Photos Wound Measurements Length: (cm) 0.7 Width: (cm) 0.5 Depth: (cm) 0.1 Area: (cm) 0.275 Volume: (cm) 0.027 % Reduction in Area: 94.2% % Reduction in Volume: 98.1% Epithelialization: Large (67-100%) Tunneling: No Undermining: No Wound Description Full Thickness Without Exposed Classification: Support Structures Wound Margin: Flat and Intact Exudate Large Amount: Exudate Type: Serosanguineous Exudate Color: red, brown Foul Odor After Cleansing: No Wound Bed Granulation Amount: None Present (0%) Exposed Structure Necrotic Amount: Large (67-100%) Fascia Exposed: No Koestner, Ardean ([id number]) Necrotic Quality: Eschar Fat Layer Exposed: No Tendon Exposed: No Muscle Exposed: No Joint Exposed: No Bone Exposed:  No Limited to Skin Breakdown Periwound Skin Texture Texture Color No Abnormalities Noted: No No Abnormalities Noted: No Callus: No Atrophie Blanche: No Crepitus: No Cyanosis: No Excoriation: No Ecchymosis: No Fluctuance: No Erythema: No Friable: No Hemosiderin Staining: No Induration: Yes Mottled: No Localized Edema: No Pallor: No Rash: No Rubor: No Scarring: Yes Temperature / Pain Moisture Temperature: No Abnormality No Abnormalities Noted: No Dry / Scaly: No Maceration: No Moist: No Wound Preparation Ulcer Cleansing: Rinsed/Irrigated with Saline Topical Anesthetic Applied: None Treatment Notes Wound #1 (Left Lower Leg) 1. Cleansed with: Clean wound with Normal Saline 2. Anesthetic Topical Lidocaine 4% cream to wound bed prior to debridement 4. Dressing Applied: Aquacel Ag 5. Secondary Alexander Signature(s) Signed: 07/05/2016 5:16:12 PM By: Nathaniel Little Entered By: Nathaniel Cool, Little, Little, Nathaniel on 07/05/2016 11:06:42 FRANCES, JOYNT ([id number]) -------------------------------------------------------------------------------- [facility] Details Patient Name: Nathaniel Little Date of Service: 07/05/2016 10:45 AM Medical Record Number: [id number] Patient Account Number: [id number] Date of Birth/Sex: [birth year] (69 y.o. Male) Treating Little: Nathaniel Little Primary Care Physician: PATIENT, NO Other Clinician: Referring Physician: Hinda Little Treating Physician/Extender: Nathaniel Little in Treatment: 5 Vital Signs Time Taken: 10:50 Temperature (F): 98.3 Height (in): 69 Pulse (bpm): 71 Weight (lbs): 260 Respiratory Rate (breaths/min): 18 Body Mass Index (BMI): 38.4 Blood Pressure (mmHg): 168/78 Reference Range: 80 - 120 mg / dl Electronic Signature(s) Signed: 07/05/2016 5:16:12 PM By: Nathaniel Little Entered By: Nathaniel Cool, Little, Little, Nathaniel on 07/05/2016 10:50:59

## 2016-07-12 ENCOUNTER — Encounter: Payer: Self-pay | Admitting: Emergency Medicine

## 2016-07-12 ENCOUNTER — Encounter: Payer: Commercial Managed Care - HMO | Admitting: Nurse Practitioner

## 2016-07-12 ENCOUNTER — Emergency Department
Admission: EM | Admit: 2016-07-12 | Discharge: 2016-07-12 | Disposition: A | Discharge status: Home / Self Care | Payer: Commercial Managed Care - HMO | Attending: Emergency Medicine | Admitting: Emergency Medicine

## 2016-07-12 ENCOUNTER — Emergency Department: Payer: Commercial Managed Care - HMO

## 2016-07-12 DIAGNOSIS — Z7982 Long term (current) use of aspirin: Secondary | ICD-10-CM | POA: Insufficient documentation

## 2016-07-12 DIAGNOSIS — R0602 Shortness of breath: Secondary | ICD-10-CM | POA: Insufficient documentation

## 2016-07-12 DIAGNOSIS — Z859 Personal history of malignant neoplasm, unspecified: Secondary | ICD-10-CM | POA: Diagnosis not present

## 2016-07-12 DIAGNOSIS — Z87891 Personal history of nicotine dependence: Secondary | ICD-10-CM | POA: Diagnosis not present

## 2016-07-12 DIAGNOSIS — R609 Edema, unspecified: Secondary | ICD-10-CM

## 2016-07-12 DIAGNOSIS — I10 Essential (primary) hypertension: Secondary | ICD-10-CM | POA: Insufficient documentation

## 2016-07-12 DIAGNOSIS — M7989 Other specified soft tissue disorders: Secondary | ICD-10-CM | POA: Diagnosis present

## 2016-07-12 DIAGNOSIS — L97222 Non-pressure chronic ulcer of left calf with fat layer exposed: Secondary | ICD-10-CM | POA: Diagnosis not present

## 2016-07-12 HISTORY — DX: Cardiac murmur, unspecified: R01.1

## 2016-07-12 HISTORY — DX: Lymphedema, not elsewhere classified: I89.0

## 2016-07-12 LAB — CBC WITH DIFFERENTIAL/PLATELET
Basophils Absolute: 0 10*3/uL (ref 0–0.1)
Basophils Relative: 1 %
EOS ABS: 0.2 10*3/uL (ref 0–0.7)
Eosinophils Relative: 2 %
HCT: 45.1 % (ref 40.0–52.0)
HEMOGLOBIN: 16 g/dL (ref 13.0–18.0)
LYMPHS ABS: 1.5 10*3/uL (ref 1.0–3.6)
LYMPHS PCT: 20 %
MCH: 33.2 pg (ref 26.0–34.0)
MCHC: 35.5 g/dL (ref 32.0–36.0)
MCV: 93.3 fL (ref 80.0–100.0)
MONOS PCT: 10 %
Monocytes Absolute: 0.7 10*3/uL (ref 0.2–1.0)
NEUTROS PCT: 67 %
Neutro Abs: 4.9 10*3/uL (ref 1.4–6.5)
Platelets: 192 10*3/uL (ref 150–440)
RBC: 4.83 MIL/uL (ref 4.40–5.90)
RDW: 13.4 % (ref 11.5–14.5)
WBC: 7.3 10*3/uL (ref 3.8–10.6)

## 2016-07-12 LAB — COMPREHENSIVE METABOLIC PANEL
ALBUMIN: 4 g/dL (ref 3.5–5.0)
ALK PHOS: 63 U/L (ref 38–126)
ALT: 30 U/L (ref 17–63)
AST: 29 U/L (ref 15–41)
Anion gap: 7 (ref 5–15)
BILIRUBIN TOTAL: 0.8 mg/dL (ref 0.3–1.2)
BUN: 23 mg/dL — AB (ref 6–20)
CALCIUM: 9.2 mg/dL (ref 8.9–10.3)
CO2: 26 mmol/L (ref 22–32)
CREATININE: 0.91 mg/dL (ref 0.61–1.24)
Chloride: 104 mmol/L (ref 101–111)
GFR calc Af Amer: >60 mL/min (ref >60)
GLUCOSE: 89 mg/dL (ref 65–99)
Potassium: 4 mmol/L (ref 3.5–5.1)
Sodium: 137 mmol/L (ref 135–145)
TOTAL PROTEIN: 7 g/dL (ref 6.5–8.1)

## 2016-07-12 LAB — TROPONIN I

## 2016-07-12 LAB — BRAIN NATRIURETIC PEPTIDE: B Natriuretic Peptide: 54 pg/mL (ref 0.0–100.0)

## 2016-07-12 NOTE — ED Notes (Signed)
Pt. returned from XR. 

## 2016-07-12 NOTE — ED Triage Notes (Addendum)
Pt presents to ED with reports of lower leg swelling for over one month. Pt was seen today in the wound care clinic and recommended that he come here for evaluation. Pt has been discharged from wound care center. Pt reports foul odor to urine. Pt in no apparent distress in triage. Speaking in complete sentences without difficulty. Denies pain.

## 2016-07-12 NOTE — ED Notes (Signed)
Patient transported to X-ray 

## 2016-07-12 NOTE — Discharge Instructions (Signed)
Please seek medical attention for any high fevers, chest pain, shortness of breath, change in behavior, persistent vomiting, bloody stool or any other new or concerning symptoms.  

## 2016-07-12 NOTE — ED Provider Notes (Signed)
[facility] Emergency Department Provider Note    ____________________________________________   I have reviewed the triage vital signs and the nursing notes.   HISTORY  Chief Complaint Leg Swelling   History limited by: Not Limited   HPI Tyric Rodeheaver is a 70 y.o. male who presents to the emergency department today sent over from wound care center because of concerns for bilateral leg swelling. The patient states that he has been having increasing swelling for the past month. His located in both legs. He denies any pain but states that he has had some itchiness. He additionally states that he has been having chest pain on and off. This is been going on for a few months. The patient denies any significant chest pain. No recent fevers. Denies any history of heart failure.    Past Medical History:  Diagnosis Date  . Cancer ([place])   . COPD (chronic obstructive pulmonary disease) ([place])   . Heart murmur   . Hypertension   . Lymphedema     There are no active problems to display for this patient.   Past Surgical History:  Procedure Laterality Date  . APPENDECTOMY    . TONSILLECTOMY      Prior to Admission medications   Medication Sig Start Date End Date Taking? Authorizing Provider  albuterol (PROVENTIL HFA;VENTOLIN HFA) 108 (90 Base) MCG/ACT inhaler Inhale 2 puffs into the lungs every 6 (six) hours as needed for wheezing or shortness of breath. 06/14/16   Joanne Gavel, MD  albuterol (PROVENTIL) (2.5 MG/3ML) 0.083% nebulizer solution Take 2.5 mg by nebulization every 6 (six) hours as needed for wheezing or shortness of breath.    Historical Provider, MD  aspirin 81 MG tablet Take 81 mg by mouth daily.    Historical Provider, MD  cephALEXin (KEFLEX) 500 MG capsule Take 1 capsule (500 mg total) by mouth 2 (two) times daily. 05/23/16   Lavonia Drafts, MD  escitalopram (LEXAPRO) 10 MG tablet Take 10 mg by mouth daily. Reported on 04/14/2016    Historical  Provider, MD  ipratropium (ATROVENT) 0.02 % nebulizer solution Take 2.5 mLs (0.5 mg total) by nebulization every 6 (six) hours as needed for wheezing or shortness of breath. 06/14/16   Joanne Gavel, MD  losartan (COZAAR) 50 MG tablet Take 50 mg by mouth daily.    Historical Provider, MD  sulfamethoxazole-trimethoprim (BACTRIM DS,SEPTRA DS) 800-160 MG tablet Take 1 tablet by mouth 2 (two) times daily. 05/23/16   Lavonia Drafts, MD    Allergies Codeine  No family history on file.  Social History Social History  Substance Use Topics  . Smoking status: Former Smoker    Years: 68.00    Types: Cigarettes  . Smokeless tobacco: Former Systems developer    Quit date: 05/16/2016  . Alcohol use Not on file    Review of Systems  Constitutional: Negative for fever. Cardiovascular: Negative for chest pain. Respiratory: Positive for shortness of breath. Gastrointestinal: Negative for abdominal pain, vomiting and diarrhea. Neurological: Negative for headaches, focal weakness or numbness.  10-point ROS otherwise negative.  ____________________________________________   PHYSICAL EXAM:  VITAL SIGNS: ED Triage Vitals  Enc Vitals Group     BP 07/12/16 1328 (!) 170/87     Pulse Rate 07/12/16 1328 82     Resp 07/12/16 1328 18     Temp 07/12/16 1328 98.1 F (36.7 C)     Temp Source 07/12/16 1328 Oral     SpO2 07/12/16 1328 94 %  Weight 07/12/16 1328 268 lb (121.6 kg)     Height 07/12/16 1328 '5\' 11"'$  (1.803 m)     Head Circumference --      Peak Flow --      Pain Score 07/12/16 1329 0   Constitutional: Alert and oriented. Well appearing and in no distress. Eyes: Conjunctivae are normal. Normal extraocular movements. ENT   Head: Normocephalic and atraumatic.   Nose: No congestion/rhinnorhea.   Mouth/Throat: Mucous membranes are moist.   Neck: No stridor. Hematological/Lymphatic/Immunilogical: No cervical lymphadenopathy. Cardiovascular: Normal rate, regular rhythm.  No murmurs, rubs,  or gallops. Respiratory: Normal respiratory effort without tachypnea nor retractions. Some rhonchi in bilateral bases.  Gastrointestinal: Soft and nontender. No distention.  Genitourinary: Deferred Musculoskeletal: Normal range of motion in all extremities. Bilateral lower extremity edema.  Neurologic:  Normal speech and language. No gross focal neurologic deficits are appreciated.  Skin:  Skin is warm, dry and intact. No rash noted. Psychiatric: Mood and affect are normal. Speech and behavior are normal. Patient exhibits appropriate insight and judgment.  ____________________________________________    LABS (pertinent positives/negatives)  Labs Reviewed  COMPREHENSIVE METABOLIC PANEL - Abnormal; Notable for the following:       Result Value   BUN 23 (*)    All other components within normal limits  CBC WITH DIFFERENTIAL/PLATELET  TROPONIN I  BRAIN NATRIURETIC PEPTIDE     ____________________________________________   EKG  None  ____________________________________________    RADIOLOGY  Abd acute IMPRESSION:  No acute findings.   ____________________________________________   PROCEDURES  Procedures  ____________________________________________   INITIAL IMPRESSION / ASSESSMENT AND PLAN / ED COURSE  Pertinent labs & imaging results that were available during my care of the patient were reviewed by me and considered in my medical decision making (see chart for details).  Patient presented to the emergency department today at the request primary care because of concerns for bilateral peripheral edema. Has been going on for the past 2 months. Workup here without any concerning findings for this point I doubt blood clot is given lack of pain and bilateral symptoms. The patient is safe for discharge and follow-up with primary care. Did recommend compression and elevation. ____________________________________________   FINAL CLINICAL IMPRESSION(S) / ED  DIAGNOSES  Final diagnoses:  Peripheral edema     Note: This dictation was prepared with Dragon dictation. Any transcriptional errors that result from this process are unintentional    Nance Pear, MD 07/12/16 1921

## 2016-07-12 NOTE — ED Notes (Signed)
Gave pt warm blanket and updated vitals.

## 2016-07-12 NOTE — ED Notes (Signed)
Pt sts he is here for bilateral leg swelling x 1 month.  Denies any worsening SOB, recent travels, and CP.  Pt alert/oriented x 4.  Able to move all limbs on command. NAD.

## 2016-07-13 NOTE — Progress Notes (Addendum)
Nathaniel Little, Nathaniel Little ([id number]) Visit Report for 07/12/2016 Arrival Information Details Patient Name: MEYER, ARORA Date of Service: 07/12/2016 12:45 PM Medical Record Number: [id number] Patient Account Number: [id number] Date of Birth/Sex: 02-24-1946 (70 y.o. Male) Treating RN: Cornell Barman Primary Care Physician: PATIENT, NO Other Clinician: Referring Physician: Hinda Kehr Treating Physician/Extender: Loistine Chance in Treatment: 6 Visit Information History Since Last Visit Added or deleted any medications: No Patient Arrived: Ambulatory Any new allergies or adverse reactions: No Arrival Time: 12:46 Had a fall or experienced change in No Accompanied By: self activities of daily living that may affect Transfer Assistance: None risk of falls: Patient Identification Verified: Yes Signs or symptoms of abuse/neglect since last No Secondary Verification Process Yes visito Completed: Hospitalized since last visit: No Patient Has Alerts: Yes Has Dressing in Place as Prescribed: Yes Patient Alerts: Patient on Blood Pain Present Now: No Thinner Aspirin 81 mg NO DM Electronic Signature(s) Signed: 07/12/2016 4:56:24 PM By: Gretta Cool, RN, BSN, Kim RN, BSN Entered By: Gretta Cool, RN, BSN, Kim on 07/12/2016 12:46:37 Nathaniel Little ([id number]) -------------------------------------------------------------------------------- Clinic Level of Care Assessment Details Patient Name: Nathaniel Little Date of Service: 07/12/2016 12:45 PM Medical Record Number: [id number] Patient Account Number: [id number] Date of Birth/Sex: May 20, 1946 (70 y.o. Male) Treating RN: Cornell Barman Primary Care Physician: PATIENT, NO Other Clinician: Referring Physician: Hinda Kehr Treating Physician/Extender: Loistine Chance in Treatment: 6 Clinic Level of Care Assessment Items TOOL 4 Quantity Score '[]'$  - Use when only an EandM is performed on FOLLOW-UP visit 0 ASSESSMENTS - Nursing Assessment / Reassessment '[]'$  -  Reassessment of Co-morbidities (includes updates in patient status) 0 X - Reassessment of Adherence to Treatment Plan 1 5 ASSESSMENTS - Wound and Skin Assessment / Reassessment X - Simple Wound Assessment / Reassessment - one wound 1 5 '[]'$  - Complex Wound Assessment / Reassessment - multiple wounds 0 '[]'$  - Dermatologic / Skin Assessment (not related to wound area) 0 ASSESSMENTS - Focused Assessment '[]'$  - Circumferential Edema Measurements - multi extremities 0 '[]'$  - Nutritional Assessment / Counseling / Intervention 0 '[]'$  - Lower Extremity Assessment (monofilament, tuning fork, pulses) 0 '[]'$  - Peripheral Arterial Disease Assessment (using hand held doppler) 0 ASSESSMENTS - Ostomy and/or Continence Assessment and Care '[]'$  - Incontinence Assessment and Management 0 '[]'$  - Ostomy Care Assessment and Management (repouching, etc.) 0 PROCESS - Coordination of Care X - Simple Patient / Family Education for ongoing care 1 15 '[]'$  - Complex (extensive) Patient / Family Education for ongoing care 0 X - Staff obtains Programmer, systems, Records, Test Results / Process Orders 1 10 '[]'$  - Staff telephones HHA, Nursing Homes / Clarify orders / etc 0 '[]'$  - Routine Transfer to another Facility (non-emergent condition) 0 Teall, Dinesh ([id number]) '[]'$  - Routine Hospital Admission (non-emergent condition) 0 '[]'$  - New Admissions / Biomedical engineer / Ordering NPWT, Apligraf, etc. 0 '[]'$  - Emergency Hospital Admission (emergent condition) 0 X - Simple Discharge Coordination 1 10 '[]'$  - Complex (extensive) Discharge Coordination 0 PROCESS - Special Needs '[]'$  - Pediatric / Minor Patient Management 0 '[]'$  - Isolation Patient Management 0 '[]'$  - Hearing / Language / Visual special needs 0 '[]'$  - Assessment of Community assistance (transportation, D/C planning, etc.) 0 '[]'$  - Additional assistance / Altered mentation 0 '[]'$  - Support Surface(s) Assessment (bed, cushion, seat, etc.) 0 INTERVENTIONS - Wound Cleansing / Measurement X - Simple  Wound Cleansing - one wound 1 5 '[]'$  - Complex Wound Cleansing - multiple wounds 0 X - Wound Imaging (photographs - any number  of wounds) 1 5 '[]'$  - Wound Tracing (instead of photographs) 0 X - Simple Wound Measurement - one wound 1 5 '[]'$  - Complex Wound Measurement - multiple wounds 0 INTERVENTIONS - Wound Dressings '[]'$  - Small Wound Dressing one or multiple wounds 0 '[]'$  - Medium Wound Dressing one or multiple wounds 0 '[]'$  - Large Wound Dressing one or multiple wounds 0 '[]'$  - Application of Medications - topical 0 '[]'$  - Application of Medications - injection 0 INTERVENTIONS - Miscellaneous '[]'$  - External ear exam 0 Escoto, Huzaifa ([id number]) '[]'$  - Specimen Collection (cultures, biopsies, blood, body fluids, etc.) 0 '[]'$  - Specimen(s) / Culture(s) sent or taken to Lab for analysis 0 '[]'$  - Patient Transfer (multiple staff / Harrel Lemon Lift / Similar devices) 0 '[]'$  - Simple Staple / Suture removal (25 or less) 0 '[]'$  - Complex Staple / Suture removal (26 or more) 0 '[]'$  - Hypo / Hyperglycemic Management (close monitor of Blood Glucose) 0 '[]'$  - Ankle / Brachial Index (ABI) - do not check if billed separately 0 X - Vital Signs 1 5 Has the patient been seen at the hospital within the last three years: Yes Total Score: 65 Level Of Care: New/Established - Level 2 Electronic Signature(s) Signed: 07/12/2016 4:56:24 PM By: Gretta Cool, RN, BSN, Kim RN, BSN Entered By: Gretta Cool, RN, BSN, Kim on 07/12/2016 13:07:33 Nathaniel Little ([id number]) -------------------------------------------------------------------------------- Encounter Discharge Information Details Patient Name: Nathaniel Little Date of Service: 07/12/2016 12:45 PM Medical Record Number: [id number] Patient Account Number: [id number] Date of Birth/Sex: [birth year] (70 y.o. Male) Treating RN: Cornell Barman Primary Care Physician: PATIENT, NO Other Clinician: Referring Physician: Hinda Kehr Treating Physician/Extender: Loistine Chance in Treatment: 6 Encounter  Discharge Information Items Discharge Pain Level: 0 Discharge Condition: Stable Ambulatory Status: [facility] Discharge Destination: Room Transportation: Private Auto Accompanied By: self Schedule Follow-up Appointment: Yes Medication Reconciliation completed and provided to Patient/Care Yes Garlon Tuggle: Provided on Clinical Summary of Care: 07/12/2016 Form Type Recipient Paper Patient HB Notes Patient instructed to go directly to ER for assessment of excess fluid, weight gain and feeling bloated. Electronic Signature(s) Signed: 07/12/2016 4:56:24 PM By: Gretta Cool RN, BSN, Kim RN, BSN Previous Signature: 07/12/2016 1:07:45 PM Version By: Ruthine Dose Entered By: Gretta Cool RN, BSN, Kim on 07/12/2016 13:12:12 DAQUANN, MERRIOTT ([id number]) -------------------------------------------------------------------------------- Lower Extremity Assessment Details Patient Name: Nathaniel Little Date of Service: 07/12/2016 12:45 PM Medical Record Number: [id number] Patient Account Number: [id number] Date of Birth/Sex: 10-11-1945 (70 y.o. Male) Treating RN: Cornell Barman Primary Care Physician: PATIENT, NO Other Clinician: Referring Physician: Hinda Kehr Treating Physician/Extender: Loistine Chance in Treatment: 6 Vascular Assessment Pulses: Posterior Tibial Dorsalis Pedis Palpable: [Left:Yes] Extremity colors, hair growth, and conditions: Extremity Color: [Left:Normal] Hair Growth on Extremity: [Left:Yes] Temperature of Extremity: [Left:Warm] Capillary Refill: [Left:< 3 seconds] Dependent Rubor: [Left:No] Blanched when Elevated: [Left:No] Lipodermatosclerosis: [Left:No] Toe Nail Assessment Left: Right: Thick: No Discolored: No Deformed: No Improper Length and Hygiene: No Electronic Signature(s) Signed: 07/12/2016 4:56:24 PM By: Gretta Cool, RN, BSN, Kim RN, BSN Entered By: Gretta Cool, RN, BSN, Kim on 07/12/2016 12:48:38 Nathaniel Little  ([id number]) -------------------------------------------------------------------------------- Multi Wound Chart Details Patient Name: Nathaniel Little Date of Service: 07/12/2016 12:45 PM Medical Record Number: [id number] Patient Account Number: [id number] Date of Birth/Sex: 1946/04/17 (70 y.o. Male) Treating RN: Cornell Barman Primary Care Physician: PATIENT, NO Other Clinician: Referring Physician: Hinda Kehr Treating Physician/Extender: Loistine Chance in Treatment: 6 Vital Signs Height(in): 69 Pulse(bpm): 75 Weight(lbs): 260 Blood Pressure 147/79 (mmHg): Body Mass Index(BMI): 38 Temperature(F): 98.8 Respiratory Rate 18 (  breaths/min): Photos: [N/A:N/A] Wound Location: Left Lower Leg N/A N/A Wounding Event: Trauma N/A N/A Primary Etiology: Trauma, Other N/A N/A Secondary Etiology: Lymphedema N/A N/A Comorbid History: Cataracts, Chronic N/A N/A Obstructive Pulmonary Disease (COPD), Hypertension, Osteoarthritis, Received Radiation Date Acquired: 05/17/2016 N/A N/A Weeks of Treatment: 6 N/A N/A Wound Status: Open N/A N/A Measurements L x W x D 0.2x0.2x0.1 N/A N/A (cm) Area (cm) : 0.031 N/A N/A Volume (cm) : 0.003 N/A N/A % Reduction in Area: 99.30% N/A N/A % Reduction in Volume: 99.80% N/A N/A Classification: Full Thickness Without N/A N/A Exposed Support Structures Exudate Amount: Large N/A N/A Exudate Type: Serosanguineous N/A N/A Exudate Color: red, brown N/A N/A Bowley, Kevyn ([id number]) Wound Margin: Flat and Intact N/A N/A Granulation Amount: None Present (0%) N/A N/A Necrotic Amount: Large (67-100%) N/A N/A Necrotic Tissue: Eschar N/A N/A Exposed Structures: Fascia: No N/A N/A Fat: No Tendon: No Muscle: No Joint: No Bone: No Limited to Skin Breakdown Epithelialization: Large (67-100%) N/A N/A Periwound Skin Texture: Edema: Yes N/A N/A Induration: Yes Scarring: Yes Excoriation: No Callus: No Crepitus: No Fluctuance: No Friable:  No Rash: No Periwound Skin Maceration: No N/A N/A Moisture: Moist: No Dry/Scaly: No Periwound Skin Color: Atrophie Blanche: No N/A N/A Cyanosis: No Ecchymosis: No Erythema: No Hemosiderin Staining: No Mottled: No Pallor: No Rubor: No Temperature: No Abnormality N/A N/A Tenderness on No N/A N/A Palpation: Wound Preparation: Ulcer Cleansing: N/A N/A Rinsed/Irrigated with Saline Topical Anesthetic Applied: None Treatment Notes Electronic Signature(s) Signed: 07/12/2016 4:56:24 PM By: Gretta Cool, RN, BSN, Kim RN, BSN Entered By: Gretta Cool, RN, BSN, Kim on 07/12/2016 12:58:57 JAYDEN, KRATOCHVIL ([id number]) BEHR, CISLO ([id number]) -------------------------------------------------------------------------------- Multi-Disciplinary Care Plan Details Patient Name: Nathaniel Little Date of Service: 07/12/2016 12:45 PM Medical Record Number: [id number] Patient Account Number: [id number] Date of Birth/Sex: January 01, 1946 (70 y.o. Male) Treating RN: Cornell Barman Primary Care Physician: PATIENT, NO Other Clinician: Referring Physician: Hinda Kehr Treating Physician/Extender: Loistine Chance in Treatment: 6 Active Inactive Electronic Signature(s) Signed: 07/14/2016 4:42:40 PM By: Gretta Cool RN, BSN, Kim RN, BSN Previous Signature: 07/12/2016 4:56:24 PM Version By: Gretta Cool RN, BSN, Kim RN, BSN Entered By: Gretta Cool, RN, BSN, Kim on 07/14/2016 14:02:17 Nathaniel Little ([id number]) -------------------------------------------------------------------------------- Pain Assessment Details Patient Name: Nathaniel Little Date of Service: 07/12/2016 12:45 PM Medical Record Number: [id number] Patient Account Number: [id number] Date of Birth/Sex: July 01, 1946 (70 y.o. Male) Treating RN: Cornell Barman Primary Care Physician: PATIENT, NO Other Clinician: Referring Physician: Hinda Kehr Treating Physician/Extender: Loistine Chance in Treatment: 6 Active Problems Location of Pain Severity and Description of  Pain Patient Has Paino No Site Locations With Dressing Change: No Pain Management and Medication Current Pain Management: Electronic Signature(s) Signed: 07/12/2016 4:56:24 PM By: Gretta Cool, RN, BSN, Kim RN, BSN Entered By: Gretta Cool, RN, BSN, Kim on 07/12/2016 12:46:52 Nathaniel Little ([id number]) -------------------------------------------------------------------------------- Wound Assessment Details Patient Name: Nathaniel Little Date of Service: 07/12/2016 12:45 PM Medical Record Number: [id number] Patient Account Number: [id number] Date of Birth/Sex: 04-Aug-1946 (70 y.o. Male) Treating RN: Cornell Barman Primary Care Physician: PATIENT, NO Other Clinician: Referring Physician: Hinda Kehr Treating Physician/Extender: Loistine Chance in Treatment: 6 Wound Status Wound Number: 1 Primary Trauma, Other Etiology: Wound Location: Left Lower Leg Secondary Lymphedema Wounding Event: Trauma Etiology: Date Acquired: 05/17/2016 Wound Healed - Epithelialized Weeks Of Treatment: 6 Status: Clustered Wound: No Comorbid Cataracts, Chronic Obstructive History: Pulmonary Disease (COPD), Hypertension, Osteoarthritis, Received Radiation Photos Wound Measurements Length: (cm) 0 % Reduction i Width: (cm) 0 % Reduction i Depth: (cm) 0 Epithelializa Area: (cm)  0 Tunneling: Volume: (cm) 0 Undermining: n Area: 100% n Volume: 100% tion: Large (67-100%) No No Wound Description Full Thickness Without Exposed Classification: Support Structures Wound Margin: Flat and Intact Exudate Large Amount: Exudate Type: Serosanguineous Exudate Color: red, brown Foul Odor After Cleansing: No Wound Bed Granulation Amount: None Present (0%) Exposed Structure Necrotic Amount: Large (67-100%) Fascia Exposed: No Tarleton, Antwine ([id number]) Necrotic Quality: Eschar Fat Layer Exposed: No Tendon Exposed: No Muscle Exposed: No Joint Exposed: No Bone Exposed: No Limited to Skin Breakdown Periwound  Skin Texture Texture Color No Abnormalities Noted: No No Abnormalities Noted: No Callus: No Atrophie Blanche: No Crepitus: No Cyanosis: No Excoriation: No Ecchymosis: No Fluctuance: No Erythema: No Friable: No Hemosiderin Staining: No Induration: Yes Mottled: No Localized Edema: Yes Pallor: No Rash: No Rubor: No Scarring: Yes Temperature / Pain Moisture Temperature: No Abnormality No Abnormalities Noted: No Dry / Scaly: No Maceration: No Moist: No Wound Preparation Ulcer Cleansing: Rinsed/Irrigated with Saline Topical Anesthetic Applied: None Electronic Signature(s) Signed: 07/12/2016 4:56:24 PM By: Gretta Cool, RN, BSN, Kim RN, BSN Entered By: Gretta Cool, RN, BSN, Kim on 07/12/2016 13:10:38 Nathaniel Little ([id number]) -------------------------------------------------------------------------------- Vitals Details Patient Name: Nathaniel Little Date of Service: 07/12/2016 12:45 PM Medical Record Number: [id number] Patient Account Number: [id number] Date of Birth/Sex: 10/21/1945 (70 y.o. Male) Treating RN: Cornell Barman Primary Care Physician: PATIENT, NO Other Clinician: Referring Physician: Hinda Kehr Treating Physician/Extender: Loistine Chance in Treatment: 6 Vital Signs Time Taken: 12:46 Temperature (F): 98.8 Height (in): 69 Pulse (bpm): 75 Weight (lbs): 260 Respiratory Rate (breaths/min): 18 Body Mass Index (BMI): 38.4 Blood Pressure (mmHg): 147/79 Reference Range: 80 - 120 mg / dl Electronic Signature(s) Signed: 07/12/2016 4:56:24 PM By: Gretta Cool, RN, BSN, Kim RN, BSN Entered By: Gretta Cool, RN, BSN, Kim on 07/12/2016 12:47:18

## 2016-07-13 NOTE — Progress Notes (Signed)
Nathaniel Little, Nathaniel Little ([id number]) Visit Report for 07/12/2016 Chief Complaint Document Details Patient Name: Nathaniel Little, Nathaniel Little Date of Service: 07/12/2016 12:45 PM Medical Record Number: [id number] Patient Account Number: [id number] Date of Birth/Sex: Dec 23, 1945 (70 y.o. Male) Treating RN: Cornell Barman Primary Care Physician: PATIENT, NO Other Clinician: Referring Physician: Hinda Kehr Treating Physician/Extender: Loistine Chance in Treatment: 6 Information Obtained from: Patient Chief Complaint Patient seen for complaints of Non-Healing Wound for his left lower extremity Electronic Signature(s) Signed: 07/12/2016 4:05:49 PM By: Londell Moh FNP Entered By: Londell Moh on 07/12/2016 12:47:18 Nathaniel Little ([id number]) -------------------------------------------------------------------------------- HPI Details Patient Name: Nathaniel Little Date of Service: 07/12/2016 12:45 PM Medical Record Number: [id number] Patient Account Number: [id number] Date of Birth/Sex: 07-02-1946 (69 y.o. Male) Treating RN: Cornell Barman Primary Care Physician: PATIENT, NO Other Clinician: Referring Physician: Hinda Kehr Treating Physician/Extender: Loistine Chance in Treatment: 6 History of Present Illness Location: left lower extremity Quality: Patient reports experiencing a sharp pain to affected area(s). Severity: Patient states wound are getting worse. Duration: Patient has had the wound for < 2 weeks prior to presenting for treatment Timing: Pain in wound is constant (hurts all the time) Context: The wound occurred when the patient a piece of plywood lacerated wound on his left lower extremity on the shin Modifying Factors: Consults to this date include:Keflex and Bactrim Associated Signs and Symptoms: Patient reports having increase swelling. HPI Description: 70 year old gentleman who presented to the ER a week ago with a history of having injury to his left leg with a piece of plywood  which lacerated his leg. Past medical system history significant for COPD and hypertension.he is a former smoker who quit smoking recently. the ER he was found to have a vertical laceration on the left lower extremity which is foul-smelling and had no abscess or discharge.an x-ray was done which was unremarkable. report reviewed -- Mild soft tissue edema is noted consistent with the given clinical history. No definitive radiopaque foreign body is seen. No bony abnormality is noted. He was started on Keflex 500 mg twice daily and Bactrim DS 1 tablet twice daily. He was seen back in the ER on 05/26/2016 and was found to have good progression of healing and no generalized cellulitis and did not warrant inpatient admission. 06/04/2016 -- the patient has not followed any of her instructions he saw him last week and has been doing dry dressings of his wound only. 06/14/2016 -- not yet got his compression stockings and he is using Medihoney pads. 06/21/2016 -- as he soiled his compression stockings but other than that has been using them regularly 07/05/16: returns today for ongoing eval and management of a left lower extremity wound. denies pain or discomfort associated with his wound. no systemic s/s of infection. 07/12/16: here today for f/u. no systemic s/s of infection. wound has healed. Electronic Signature(s) Signed: 07/12/2016 4:05:49 PM By: Londell Moh FNP Entered By: Londell Moh on 07/12/2016 12:59:45 Nathaniel Little, Nathaniel Little ([id number]) -------------------------------------------------------------------------------- Physical Exam Details Patient Name: Nathaniel Little Date of Service: 07/12/2016 12:45 PM Medical Record Number: [id number] Patient Account Number: [id number] Date of Birth/Sex: 1946/04/15 (70 y.o. Male) Treating RN: Cornell Barman Primary Care Physician: PATIENT, NO Other Clinician: Referring Physician: Hinda Kehr Treating Physician/Extender: Loistine Chance in Treatment:  6 Constitutional morbidly obese. NAD.. Ears, Nose, Mouth, and Throat Patient can hear normal speaking tones without difficulty.. Cardiovascular Extremities are free of varicosities, clubbing or edema.[name] Psychiatric Judgement and insight intact.. Alert and oriented times 3.. Short and long term memory intact.[name] No  evidence of depression, anxiety, or agitation. Calm, cooperative, and communicative. Appropriate interactions and affect.. Electronic Signature(s) Signed: 07/12/2016 4:05:49 PM By: Londell Moh FNP Entered By: Londell Moh on 07/12/2016 13:00:22 Nathaniel Little, Nathaniel Little ([id number]) -------------------------------------------------------------------------------- Physician Orders Details Patient Name: Nathaniel Little Date of Service: 07/12/2016 12:45 PM Medical Record Number: [id number] Patient Account Number: [id number] Date of Birth/Sex: [birth year] (70 y.o. Male) Treating RN: Cornell Barman Primary Care Physician: PATIENT, NO Other Clinician: Referring Physician: Hinda Kehr Treating Physician/Extender: Loistine Chance in Treatment: 6 Verbal / Phone Orders: Yes Clinician: Cornell Barman Read Back and Verified: Yes Diagnosis Coding ICD-10 Coding Code Description [phone] Laceration without foreign body, left lower leg, initial encounter L97.222 Non-pressure chronic ulcer of left calf with fat layer exposed I89.0 Lymphedema, not elsewhere classified Z87.891 Personal history of nicotine dependence E66.01 Morbid (severe) obesity due to excess calories Discharge From [facility] Wound #1 Left Lower Leg o Discharge from [facility] - treatment complete Electronic Signature(s) Signed: 07/12/2016 4:05:49 PM By: Londell Moh FNP Signed: 07/12/2016 4:56:24 PM By: Gretta Cool RN, BSN, Kim RN, BSN Entered By: Gretta Cool, RN, BSN, Kim on 07/12/2016 13:00:06 Nathaniel Little ([id number]) -------------------------------------------------------------------------------- Problem List  Details Patient Name: Nathaniel Little Date of Service: 07/12/2016 12:45 PM Medical Record Number: [id number] Patient Account Number: [id number] Date of Birth/Sex: [birth year] (69 y.o. Male) Treating RN: Cornell Barman Primary Care Physician: PATIENT, NO Other Clinician: Referring Physician: Hinda Kehr Treating Physician/Extender: Loistine Chance in Treatment: 6 Active Problems ICD-10 Encounter Code Description Active Date Diagnosis S81.812A Laceration without foreign body, left lower leg, initial 05/31/2016 Yes encounter L97.222 Non-pressure chronic ulcer of left calf with fat layer 05/31/2016 Yes exposed I89.0 Lymphedema, not elsewhere classified 05/31/2016 Yes Z87.891 Personal history of nicotine dependence 05/31/2016 Yes E66.01 Morbid (severe) obesity due to excess calories 05/31/2016 Yes Inactive Problems Resolved Problems Electronic Signature(s) Signed: 07/12/2016 4:05:49 PM By: Londell Moh FNP Entered By: Londell Moh on 07/12/2016 12:47:11 [place], [place] ([id number]) -------------------------------------------------------------------------------- Progress Note Details Patient Name: Nathaniel Little Date of Service: 07/12/2016 12:45 PM Medical Record Number: [id number] Patient Account Number: [id number] Date of Birth/Sex: [birth year] (70 y.o. Male) Treating RN: Cornell Barman Primary Care Physician: PATIENT, NO Other Clinician: Referring Physician: Hinda Kehr Treating Physician/Extender: Loistine Chance in Treatment: 6 Subjective Chief Complaint Information obtained from Patient Patient seen for complaints of Non-Healing Wound for his left lower extremity History of Present Illness (HPI) The following HPI elements were documented for the patient's wound: Location: left lower extremity Quality: Patient reports experiencing a sharp pain to affected area(s). Severity: Patient states wound are getting worse. Duration: Patient has had the wound for < 2 weeks prior  to presenting for treatment Timing: Pain in wound is constant (hurts all the time) Context: The wound occurred when the patient a piece of plywood lacerated wound on his left lower extremity on the shin Modifying Factors: Consults to this date include:Keflex and Bactrim Associated Signs and Symptoms: Patient reports having increase swelling. 70 year old gentleman who presented to the ER a week ago with a history of having injury to his left leg with a piece of plywood which lacerated his leg. Past medical system history significant for COPD and hypertension.he is a former smoker who quit smoking recently. the ER he was found to have a vertical laceration on the left lower extremity which is foul-smelling and had no abscess or discharge.an x-ray was done which was unremarkable. report reviewed -- Mild soft tissue edema is noted consistent with the given clinical history. No definitive radiopaque foreign body  is seen. No bony abnormality is noted. He was started on Keflex 500 mg twice daily and Bactrim DS 1 tablet twice daily. He was seen back in the ER on 05/26/2016 and was found to have good progression of healing and no generalized cellulitis and did not warrant inpatient admission. 06/04/2016 -- the patient has not followed any of her instructions he saw him last week and has been doing dry dressings of his wound only. 06/14/2016 -- not yet got his compression stockings and he is using Medihoney pads. 06/21/2016 -- as he soiled his compression stockings but other than that has been using them regularly 07/05/16: returns today for ongoing eval and management of a left lower extremity wound. denies pain or discomfort associated with his wound. no systemic s/s of infection. 07/12/16: here today for f/u. no systemic s/s of infection. wound has healed. Nathaniel Little, Nathaniel Little ([id number]) Objective Constitutional morbidly obese. NAD.[name] Vitals Time Taken: 12:46 PM, Height: 69 in, Weight: 260 lbs, BMI: 38.4,  Temperature: 98.8 F, Pulse: 75 bpm, Respiratory Rate: 18 breaths/min, Blood Pressure: 147/79 mmHg. Ears, Nose, Mouth, and Throat Patient can hear normal speaking tones without difficulty.. Cardiovascular Extremities are free of varicosities, clubbing or edema.[name] Psychiatric Judgement and insight intact.. Alert and oriented times 3.. Short and long term memory intact.. No evidence of depression, anxiety, or agitation. Calm, cooperative, and communicative. Appropriate interactions and affect.. Integumentary (Hair, Skin) Wound #1 status is Open. Original cause of wound was Trauma. The wound is located on the Left Lower Leg. The wound measures 0.2cm length x 0.2cm width x 0.1cm depth; 0.031cm^2 area and 0.003cm^3 volume. The wound is limited to skin breakdown. There is no tunneling or undermining noted. There is a large amount of serosanguineous drainage noted. The wound margin is flat and intact. There is no granulation within the wound bed. There is a large (67-100%) amount of necrotic tissue within the wound bed including Eschar. The periwound skin appearance exhibited: Induration, Localized Edema, Scarring. The periwound skin appearance did not exhibit: Callus, Crepitus, Excoriation, Fluctuance, Friable, Rash, Dry/Scaly, Maceration, Moist, Atrophie Blanche, Cyanosis, Ecchymosis, Hemosiderin Staining, Mottled, Pallor, Rubor, Erythema. Periwound temperature was noted as No Abnormality. Assessment Active Problems ICD-10 S81.812A - Laceration without foreign body, left lower leg, initial encounter L97.222 - Non-pressure chronic ulcer of left calf with fat layer exposed I89.0 - Lymphedema, not elsewhere classified Z87.891 - Personal history of nicotine dependence E66.01 - Morbid (severe) obesity due to excess calories Nathaniel Little, Nathaniel Little ([id number]) Plan Discharge From [facility]: Wound #1 Left Lower Leg: Discharge from [facility] - treatment complete 1. discussed post epithelialized  skin care with pt. all questions were answered. verbalized understanding. Electronic Signature(s) Signed: 07/12/2016 4:05:49 PM By: Londell Moh FNP Entered By: Londell Moh on 07/12/2016 13:01:11 Nathaniel Little ([id number]) -------------------------------------------------------------------------------- SuperBill Details Patient Name: Nathaniel Little Date of Service: 07/12/2016 Medical Record Number: [id number] Patient Account Number: [id number] Date of Birth/Sex: 12-08-1945 (69 y.o. Male) Treating RN: Cornell Barman Primary Care Physician: PATIENT, NO Other Clinician: Referring Physician: Hinda Kehr Treating Physician/Extender: Loistine Chance in Treatment: 6 Diagnosis Coding ICD-10 Codes Code Description [phone] Laceration without foreign body, left lower leg, initial encounter L97.222 Non-pressure chronic ulcer of left calf with fat layer exposed I89.0 Lymphedema, not elsewhere classified Z87.891 Personal history of nicotine dependence E66.01 Morbid (severe) obesity due to excess calories Facility Procedures CPT4 Code: [id number] Description: 44818 - WOUND CARE VISIT-LEV 2 EST PT Modifier: Quantity: 1 Physician Procedures CPT4 Code Description: [id number] - WC PHYS LEVEL 2 -  EST PT ICD-10 Description Diagnosis S81.812A Laceration without foreign body, left lower leg, ini L97.222 Non-pressure chronic ulcer of left calf with fat lay Modifier: tial encounter er exposed Quantity: 1 Electronic Signature(s) Signed: 07/12/2016 4:05:49 PM By: Londell Moh FNP Signed: 07/12/2016 4:56:24 PM By: Gretta Cool RN, BSN, Kim RN, BSN Entered By: Gretta Cool, RN, BSN, Kim on 07/12/2016 13:08:51

## 2017-01-04 DIAGNOSIS — G4733 Obstructive sleep apnea (adult) (pediatric): Secondary | ICD-10-CM | POA: Diagnosis not present

## 2017-01-06 ENCOUNTER — Ambulatory Visit (INDEPENDENT_AMBULATORY_CARE_PROVIDER_SITE_OTHER): Payer: Medicare Other | Admitting: Nurse Practitioner

## 2017-01-06 ENCOUNTER — Encounter: Payer: Self-pay | Admitting: Nurse Practitioner

## 2017-01-06 VITALS — BP 146/72 | HR 81 | Temp 97.8°F | Resp 16 | Ht 69.0 in | Wt 271.0 lb

## 2017-01-06 DIAGNOSIS — F32A Depression, unspecified: Secondary | ICD-10-CM | POA: Insufficient documentation

## 2017-01-06 DIAGNOSIS — R011 Cardiac murmur, unspecified: Secondary | ICD-10-CM | POA: Insufficient documentation

## 2017-01-06 DIAGNOSIS — G4730 Sleep apnea, unspecified: Secondary | ICD-10-CM | POA: Diagnosis not present

## 2017-01-06 DIAGNOSIS — Z716 Tobacco abuse counseling: Secondary | ICD-10-CM | POA: Diagnosis not present

## 2017-01-06 DIAGNOSIS — Z720 Tobacco use: Secondary | ICD-10-CM | POA: Diagnosis not present

## 2017-01-06 DIAGNOSIS — Z7689 Persons encountering health services in other specified circumstances: Secondary | ICD-10-CM

## 2017-01-06 DIAGNOSIS — F5221 Male erectile disorder: Secondary | ICD-10-CM | POA: Diagnosis not present

## 2017-01-06 DIAGNOSIS — N529 Male erectile dysfunction, unspecified: Secondary | ICD-10-CM

## 2017-01-06 DIAGNOSIS — M1610 Unilateral primary osteoarthritis, unspecified hip: Secondary | ICD-10-CM

## 2017-01-06 DIAGNOSIS — J449 Chronic obstructive pulmonary disease, unspecified: Secondary | ICD-10-CM

## 2017-01-06 DIAGNOSIS — F329 Major depressive disorder, single episode, unspecified: Secondary | ICD-10-CM

## 2017-01-06 DIAGNOSIS — K219 Gastro-esophageal reflux disease without esophagitis: Secondary | ICD-10-CM | POA: Diagnosis not present

## 2017-01-06 DIAGNOSIS — R635 Abnormal weight gain: Secondary | ICD-10-CM | POA: Diagnosis not present

## 2017-01-06 LAB — TSH: TSH: 1.22 mIU/L (ref 0.40–4.50)

## 2017-01-06 NOTE — Patient Instructions (Addendum)
Nathaniel Little, Thank you for coming in to clinic today.  1. Smoking Cessation:  Your goal for stop date is May 31st, 2018.  We will start Wellbutrin at your appointment in may.  2. For your urology appointment.  You will get a phone call from urology with an appointment.  If you need to change it you can call them.   Allow 2 weeks for processing the referral.    [facility] [address] [place] Phone: [phone]  All the providers that you could be scheduled with. Hollice Espy, MD Baruch Gouty, MD Festus Aloe, MD Louis Meckel, MD Bjorn Loser, MD Phebe Colla, MD Nicolette Bang, MD Zara Council, PA  3. For your weight gain, we are going to check your TSH.  If it is low, this could be contributing.    Please schedule a follow-up appointment with Cassell Smiles, AGNP in 1 month for weight management and smoking cessation.   If you have any other questions or concerns, please feel free to call the clinic or send a message through [place]. You may also schedule an earlier appointment if necessary.  Cassell Smiles, DNP, AGNP-BC Adult Gerontology Nurse Practitioner [facility], [place]    Steps to Quit Smoking Smoking tobacco can be harmful to your health and can affect almost every organ in your body. Smoking puts you, and those around you, at risk for developing many serious chronic diseases. Quitting smoking is difficult, but it is one of the best things that you can do for your health. It is never too late to quit. What are the benefits of quitting smoking? When you quit smoking, you lower your risk of developing serious diseases and conditions, such as:  Lung cancer or lung disease, such as COPD.  Heart disease.  Stroke.  Heart attack.  Infertility.  Osteoporosis and bone fractures. Additionally, symptoms such as coughing, wheezing, and shortness of breath may get better when you quit. You  may also find that you get sick less often because your body is stronger at fighting off colds and infections. If you are pregnant, quitting smoking can help to reduce your chances of having a baby of low birth weight. How do I get ready to quit? When you decide to quit smoking, create a plan to make sure that you are successful. Before you quit:  Pick a date to quit. Set a date within the next two weeks to give you time to prepare.  Write down the reasons why you are quitting. Keep this list in places where you will see it often, such as on your bathroom mirror or in your car or wallet.  Identify the people, places, things, and activities that make you want to smoke (triggers) and avoid them. Make sure to take these actions:  Throw away all cigarettes at home, at work, and in your car.  Throw away smoking accessories, such as Scientist, research (medical).  Clean your car and make sure to empty the ashtray.  Clean your home, including curtains and carpets.  Tell your family, friends, and coworkers that you are quitting. Support from your loved ones can make quitting easier.  Talk with your health care provider about your options for quitting smoking.  Find out what treatment options are covered by your health insurance. What strategies can I use to quit smoking? Talk with your healthcare provider about different strategies to quit smoking. Some strategies include:  Quitting smoking altogether instead of gradually lessening how  much you smoke over a period of time. Research shows that quitting "cold [place]" is more successful than gradually quitting.  Attending in-person counseling to help you build problem-solving skills. You are more likely to have success in quitting if you attend several counseling sessions. Even short sessions of 10 minutes can be effective.  Finding resources and support systems that can help you to quit smoking and remain smoke-free after you quit. These resources are  most helpful when you use them often. They can include:  Online chats with a Social worker.  Telephone quitlines.  Printed Furniture conservator/restorer.  Support groups or group counseling.  Text messaging programs.  Mobile phone applications.  Taking medicines to help you quit smoking. (If you are pregnant or breastfeeding, talk with your health care provider first.) Some medicines contain nicotine and some do not. Both types of medicines help with cravings, but the medicines that include nicotine help to relieve withdrawal symptoms. Your health care provider may recommend:  Nicotine patches, gum, or lozenges.  Nicotine inhalers or sprays.  Non-nicotine medicine that is taken by mouth. Talk with your health care provider about combining strategies, such as taking medicines while you are also receiving in-person counseling. Using these two strategies together makes you more likely to succeed in quitting than if you used either strategy on its own. If you are pregnant or breastfeeding, talk with your health care provider about finding counseling or other support strategies to quit smoking. Do not take medicine to help you quit smoking unless told to do so by your health care provider. What things can I do to make it easier to quit? Quitting smoking might feel overwhelming at first, but there is a lot that you can do to make it easier. Take these important actions:  Reach out to your family and friends and ask that they support and encourage you during this time. Call telephone quitlines, reach out to support groups, or work with a counselor for support.  Ask people who smoke to avoid smoking around you.  Avoid places that trigger you to smoke, such as bars, parties, or smoke-break areas at work.  Spend time around people who do not smoke.  Lessen stress in your life, because stress can be a smoking trigger for some people. To lessen stress, try:  Exercising regularly.  Deep-breathing  exercises.  Yoga.  Meditating.  Performing a body scan. This involves closing your eyes, scanning your body from head to toe, and noticing which parts of your body are particularly tense. Purposefully relax the muscles in those areas.  Download or purchase mobile phone or tablet apps (applications) that can help you stick to your quit plan by providing reminders, tips, and encouragement. There are many free apps, such as QuitGuide from the [employer] Office manager for Disease Control and Prevention). You can find other support for quitting smoking (smoking cessation) through smokefree.gov and other websites. How will I feel when I quit smoking? Within the first 24 hours of quitting smoking, you may start to feel some withdrawal symptoms. These symptoms are usually most noticeable 2-3 days after quitting, but they usually do not last beyond 2-3 weeks. Changes or symptoms that you might experience include:  Mood swings.  Restlessness, anxiety, or irritation.  Difficulty concentrating.  Dizziness.  Strong cravings for sugary foods in addition to nicotine.  Mild weight gain.  Constipation.  Nausea.  Coughing or a sore throat.  Changes in how your medicines work in your body.  A depressed mood.  Difficulty  sleeping (insomnia). After the first 2-3 weeks of quitting, you may start to notice more positive results, such as:  Improved sense of smell and taste.  Decreased coughing and sore throat.  Slower heart rate.  Lower blood pressure.  Clearer skin.  The ability to breathe more easily.  Fewer sick days. Quitting smoking is very challenging for most people. Do not get discouraged if you are not successful the first time. Some people need to make many attempts to quit before they achieve long-term success. Do your best to stick to your quit plan, and talk with your health care provider if you have any questions or concerns. This information is not intended to replace advice given to  you by your health care provider. Make sure you discuss any questions you have with your health care provider. Document Released: 09/14/2001 Document Revised: 05/18/2016 Document Reviewed: 02/04/2015 Elsevier Interactive Patient Education  2017 [place].   Bupropion sustained-release tablets (smoking cessation) What is this medicine? BUPROPION (byoo PROE pee on) is used to help people quit smoking. This medicine may be used for other purposes; ask your health care provider or pharmacist if you have questions. COMMON BRAND NAME(S): Buproban, Zyban What should I tell my health care provider before I take this medicine? They need to know if you have any of these conditions: -an eating disorder, such as anorexia or bulimia -bipolar disorder or psychosis -diabetes or high blood sugar, treated with medication -glaucoma -head injury or brain tumor -heart disease, previous heart attack, or irregular heart beat -high blood pressure -kidney or liver disease -seizures -suicidal thoughts or a previous suicide attempt -Tourette's syndrome -weight loss -an unusual or allergic reaction to bupropion, other medicines, foods, dyes, or preservatives -breast-feeding -pregnant or trying to become pregnant How should I use this medicine? Take this medicine by mouth with a glass of water. Follow the directions on the prescription label. You can take it with or without food. If it upsets your stomach, take it with food. Do not cut, crush or chew this medicine. Take your medicine at regular intervals. If you take this medicine more than once a day, take your second dose at least 8 hours after you take your first dose. To limit difficulty in sleeping, avoid taking this medicine at bedtime. Do not take your medicine more often than directed. Do not stop taking this medicine suddenly except upon the advice of your doctor. Stopping this medicine too quickly may cause serious side effects. A special MedGuide  will be given to you by the pharmacist with each prescription and refill. Be sure to read this information carefully each time. Talk to your pediatrician regarding the use of this medicine in children. Special care may be needed. Overdosage: If you think you have taken too much of this medicine contact a poison control center or emergency room at once. NOTE: This medicine is only for you. Do not share this medicine with others. What if I miss a dose? If you miss a dose, skip the missed dose and take your next tablet at the regular time. There should be at least 8 hours between doses. Do not take double or extra doses. What may interact with this medicine? Do not take this medicine with any of the following medications: -linezolid -MAOIs like Azilect, Carbex, Eldepryl, Marplan, Nardil, and Parnate -methylene blue (injected into a vein) -other medicines that contain bupropion like Wellbutrin This medicine may also interact with the following medications: -alcohol -certain medicines for anxiety or sleep -  certain medicines for blood pressure like metoprolol, propranolol -certain medicines for depression or psychotic disturbances -certain medicines for HIV or AIDS like efavirenz, lopinavir, nelfinavir, ritonavir -certain medicines for irregular heart beat like propafenone, flecainide -certain medicines for Parkinson's disease like amantadine, levodopa -certain medicines for seizures like carbamazepine, phenytoin, phenobarbital -cimetidine -clopidogrel -cyclophosphamide -digoxin -furazolidone -isoniazid -nicotine -orphenadrine -procarbazine -steroid medicines like prednisone or cortisone -stimulant medicines for attention disorders, weight loss, or to stay awake -tamoxifen -theophylline -thiotepa -ticlopidine -tramadol -warfarin This list may not describe all possible interactions. Give your health care provider a list of all the medicines, herbs, non-prescription drugs, or dietary  supplements you use. Also tell them if you smoke, drink alcohol, or use illegal drugs. Some items may interact with your medicine. What should I watch for while using this medicine? Visit your doctor or health care professional for regular checks on your progress. This medicine should be used together with a patient support program. It is important to participate in a behavioral program, counseling, or other support program that is recommended by your health care professional. Patients and their families should watch out for new or worsening thoughts of suicide or depression. Also watch out for sudden changes in feelings such as feeling anxious, agitated, panicky, irritable, hostile, aggressive, impulsive, severely restless, overly excited and hyperactive, or not being able to sleep. If this happens, especially at the beginning of treatment or after a change in dose, call your health care professional. Avoid alcoholic drinks while taking this medicine. Drinking excessive alcoholic beverages, using sleeping or anxiety medicines, or quickly stopping the use of these agents while taking this medicine may increase your risk for a seizure. Do not drive or use heavy machinery until you know how this medicine affects you. This medicine can impair your ability to perform these tasks. Do not take this medicine close to bedtime. It may prevent you from sleeping. Your mouth may get dry. Chewing sugarless gum or sucking hard candy, and drinking plenty of water may help. Contact your doctor if the problem does not go away or is severe. Do not use nicotine patches or chewing gum without the advice of your doctor or health care professional while taking this medicine. You may need to have your blood pressure taken regularly if your doctor recommends that you use both nicotine and this medicine together. What side effects may I notice from receiving this medicine? Side effects that you should report to your doctor or  health care professional as soon as possible: -allergic reactions like skin rash, itching or hives, swelling of the face, lips, or tongue -breathing problems -changes in vision -confusion -elevated mood, decreased need for sleep, racing thoughts, impulsive behavior -fast or irregular heartbeat -hallucinations, loss of contact with reality -increased blood pressure -redness, blistering, peeling or loosening of the skin, including inside the mouth -seizures -suicidal thoughts or other mood changes -unusually weak or tired -vomiting Side effects that usually do not require medical attention (report to your doctor or health care professional if they continue or are bothersome): -constipation -headache -loss of appetite -nausea -tremors -weight loss This list may not describe all possible side effects. Call your doctor for medical advice about side effects. You may report side effects to FDA at 1-800-FDA-1088. Where should I keep my medicine? Keep out of the reach of children. Store at room temperature between 20 and 25 degrees C (68 and 77 degrees F). Protect from light. Keep container tightly closed. Throw away any unused medicine after the expiration  date. NOTE: This sheet is a summary. It may not cover all possible information. If you have questions about this medicine, talk to your doctor, pharmacist, or health care provider.  2018 Elsevier/Gold Standard (2016-03-12 13:49:28)

## 2017-01-06 NOTE — Progress Notes (Signed)
Subjective:    Patient ID: Nathaniel Little, male    DOB: 1946-05-02, 71 y.o.   MRN: [id number]  Nathaniel Little is a 71 y.o. male presenting on 01/06/2017 for Establish Care; Hypertension; and Depression   HPI  Mr Nathaniel Little has come to establish new doctor because he has not agreed with the care recommendation from his prior provider about his hip arthritis and would like a second opinion about hip.  Former PCP was Nathaniel Little in [place].  In addition to wanting a second opinion, Mr Nathaniel Little wants a new provider closer to home.  Hip arthritis He has arthritis in his hip and was previously told he needed hip surgery.  He used to lay on back to sleep, but has had to sleep on his side r/t some GI issues.  When he sleeps on right side, he gets up in am and has pain.  Pain subsides when he has been up and moving.  He has more difficulty with mobility and pain when it is cold.  Walks with a cane x 5 years.  He reports that his last hip x-ray was over 2 years ago.  Inability to obtain erection The last time [name] was able to obtain a complete erection was prior to his radiation treatment for prostate cancer.  He desires sexual activity, but is unable due to having incomplete erections.  He was cautioned before prostate cancer treatment of this risk and that he might need a pump.  He denies having chemotherapy and surgical cancer treatment options. He requests a referral today.  Rapid weight gain Gained weight in [place] - 31 lbs in 6 months.  Most of the gain he attributes to staying in shelter in [place] and all of the high calorie meals he was offered.  However, he is continuing to gain after living with his daughter.  He only eats one meal per day and it is usually a Nathaniel Little dinner/pot pie.  He also snacks some through the day.  He feels his diet is now similar to the one he maintained in [place].  He admits to being more physically active when in [place] and in [place].  Smoking cessation Quit smoking 3  months ago, but restarted 1.5 months later and is now smoking 3-4 cigarettes per day.  Restarted because depressed with no place to go. Wants help to stay quit. He desires to quit smoking and wants assistance.  He has set a new goal for a Quit date of 03/03/2017.    COPD He feels his COPD is stable.  He uses his albuterol/ipratropium nebulizer about 1 x per month.  Uses only if inhaler doesn't help.  He also uses an albuterol inhaler about 1-4 x per day on 2 days per week.  He typically has no difficulty breathing 5 days per week.    Sleep apnea  He reports regular CPAP use.  His doctor in [place], [place] set it up and has had no changes in several years.  He sleeps well except for when waking to urinate and leg cramps.    GI distress/GERD He has an EGD/Colonoscopy scheduled on Feb 25, 2017.  He was previously evaluated for belching, nausea and vomiting after eating, bloating, abdominal discomfort, and constipation.  He has not had any worsening of symptoms and is continuing to use miralax daily.  Depression He has taken escitalopram 10 mg daily for 10 years.  He feels it isn't working as well as it once did.  He has  had some significant stressors and is somewhat socially isolated.  He does have support from his daughter but admits he misses being around people his own age.  He denies HI/SI.  PHQ 9 screening performed.   Depression screen [facility] 2/9 01/06/2017 01/06/2017  Decreased Interest 0 0  Down, Depressed, Hopeless 2 1  PHQ - 2 Score 2 1  Altered sleeping 1 -  Tired, decreased energy 2 -  Change in appetite 0 -  Feeling bad or failure about yourself  0 -  Trouble concentrating 0 -  Moving slowly or fidgety/restless 0 -  Suicidal thoughts 0 -  PHQ-9 Score 5 -  Difficult doing work/chores Not difficult at all -    Past Medical History:  Diagnosis Date  . Cancer ([place]) 03/2013   Prostate  . COPD (chronic obstructive pulmonary disease) ([place])   . Depression   . Heart murmur   . Hypertension     . Lymphedema   . Sleep apnea with use of continuous positive airway pressure (CPAP)    Past Surgical History:  Procedure Laterality Date  . APPENDECTOMY    . TONSILLECTOMY     Social History   Social History  . Marital status: Widowed    Spouse name: N/A  . Number of children: N/A  . Years of education: N/A   Occupational History  . Not on file.   Social History Main Topics  . Smoking status: Current Some Day Smoker    Years: 68.00    Types: Cigarettes  . Smokeless tobacco: Former Systems developer    Quit date: 05/16/2016  . Alcohol use No  . Drug use: No  . Sexual activity: Not on file   Other Topics Concern  . Not on file   Social History Narrative  . He previously lived in [place], [place] until 6 months ago when he moved to [place] and was living in homeless shelters.  2 Months ago he [place]  He states he would like to live alone again or at least near other people his age.  He is currently working with the housing authority to obtain affordable senior housing.  He is able to complete all ADLs and iADLs except driving.   Family History  Problem Relation Age of Onset  . Breast cancer Mother   . Cancer Father     Black Lung  . Cerebral palsy Brother   . Tuberculosis Paternal Uncle    Current Outpatient Prescriptions on File Prior to Visit  Medication Sig  . albuterol (PROVENTIL HFA;VENTOLIN HFA) 108 (90 Base) MCG/ACT inhaler Inhale 2 puffs into the lungs every 6 (six) hours as needed for wheezing or shortness of breath.  [name] albuterol (PROVENTIL) (2.5 MG/3ML) 0.083% nebulizer solution Take 2.5 mg by nebulization every 6 (six) hours as needed for wheezing or shortness of breath.  [name] aspirin 81 MG tablet Take 81 mg by mouth daily.  [name] escitalopram (LEXAPRO) 10 MG tablet Take 10 mg by mouth daily. Reported on 04/14/2016  . ipratropium (ATROVENT) 0.02 % nebulizer solution Take 2.5 mLs (0.5 mg total) by nebulization every 6 (six) hours as needed  for wheezing or shortness of breath.   No current facility-administered medications on file prior to visit.     Review of Systems  Constitutional: Positive for chills. Negative for activity change, appetite change, diaphoresis, fatigue, fever and unexpected weight change.  HENT: Negative.   Eyes:       Cataracts  Respiratory: Positive for shortness of breath and wheezing.   Gastrointestinal: Positive for abdominal distention and constipation.  Endocrine: Negative.   Genitourinary: Negative for decreased urine volume, dysuria, frequency, hematuria and urgency.  Musculoskeletal: Positive for arthralgias.       Leg cramps at night  Skin: Negative.   Allergic/Immunologic: Negative.   Neurological: Positive for tremors and light-headedness.  Hematological: Negative.   Psychiatric/Behavioral: Negative for sleep disturbance and suicidal ideas.       Down mood   Per HPI unless specifically indicated above     Objective:    BP (!) 146/72   Pulse 81   Temp 97.8 F (36.6 C) (Oral)   Resp 16   Ht '5\' 9"'$  (1.753 m)   Wt 271 lb (122.9 kg)   BMI 40.02 kg/m    Wt Readings from Last 3 Encounters:  01/06/17 271 lb (122.9 kg)  07/12/16 268 lb (121.6 kg)  06/14/16 235 lb (106.6 kg)    Physical Exam  Constitutional: He is oriented to person, place, and time. He appears well-developed and well-nourished. No distress.  HENT:  Head: Normocephalic and atraumatic.  Nose: Nose normal.  Mouth/Throat: Oropharynx is clear and moist.  Eyes: Conjunctivae and EOM are normal. Pupils are equal, round, and reactive to light.  Neck: Normal range of motion. Neck supple. No JVD present. No thyromegaly present.  Negative carotid bruit  Cardiovascular: Normal rate, regular rhythm and intact distal pulses.   Murmur heard.  Crescendo systolic murmur is present with a grade of 4/6  Pulmonary/Chest: Effort normal and breath sounds normal. No stridor. No respiratory distress.  Abdominal: Soft. Bowel sounds  are normal. He exhibits distension. There is no tenderness.  Musculoskeletal:  antalgic gait  Lymphadenopathy:    He has no cervical adenopathy.  Neurological: He is alert and oriented to person, place, and time.  Skin: Skin is warm and dry.  Psychiatric: He has a normal mood and affect. His behavior is normal. Thought content normal.  Vitals reviewed.  Results for orders placed or performed in visit on 01/06/17  TSH  Result Value Ref Range   TSH 1.22 0.40 - 4.50 mIU/L      Assessment & Plan:   Problem List Items Addressed This Visit    Depression 1. PHQ score = 5 today without significant impact on daily activities. 2. No recommendation to increase citalopram today.  Continue taking citalopram 10 mg daily. 3. Follow PHQ-9 at subsequent visits and reassess.  Hopeful that patient will obtain senior housing and moods will improve.       Sleep apnea with use of continuous positive airway pressure (CPAP) 1. Continue to use CPAP nightly. 2. Consider pulmonology referral in future if problems arise.    Arthritis pain of hip 1. Obtain records from previous providers.  I would like to compare X-rays before making any recommendations for treatment.   2. After initial discussion, patient placed more emphasis on other diagnoses.  Will re-evaluate at a later visit.    COPD (chronic obstructive pulmonary disease) ([place]) 1. Stable symptoms requiring limited SABA use. 2. Consider pulmonology referral if symptoms worsen in future.    GERD (gastroesophageal reflux disease) 1. Continue with EGD/Colonosopy as planned by GI. 2. Continue taking omeprazole 40 mg TID prn. 3. Continue taking miralax 2x dailyas needed.   Relevant Medications   omeprazole (PRILOSEC) 40 MG capsule   polyethylene glycol powder (GLYCOLAX/MIRALAX) powder   Inability to attain erection 1. Patient with history of prostate cancer and  radiation without chemo or surgical removal. 2. Patient desires sexual activity and  wishes to pursue treatment options. 3. Referral to urology for further evaluation and treatment.   Relevant Orders   Ambulatory referral to Urology   Rapid weight gain 1. Diet/activity versus medical cause. 2. Discussed current diet, and past diet; current activity and past activity.  Patient seems to have a poor understanding of nutrition and/or financial limitations that determine food choices. 3. TSH normal.   4. Plan to address weight management at next visit in May.   Relevant Orders   TSH (Completed)    Other Visit Diagnoses    Encounter to establish care with new doctor    -  Primary 1. Obtain outside medical records from [facility].    Encounter for smoking cessation counseling     1. Discussed desire to quit.  Patient set a quit date of May 31st. 2. Patient wishes to start wellbutrin to assist in smoking cessation. 3. Information about smoking cessation tips and wellbutrin provided for patient to review prior to next visit in May.  Prescription and final counseling deferred until next visit.  Discussion today >5 (<10 minutes) specifically on counseling on risks of tobacco use, complications, treatment, smoking cessation.       Meds ordered this encounter  Medications  . omeprazole (PRILOSEC) 40 MG capsule    Sig: Take 40 mg by mouth 3 times/day as needed-between meals & bedtime.  . polyethylene glycol powder (GLYCOLAX/MIRALAX) powder    Sig: Take 17 g by mouth 2 (two) times daily.  [name] losartan (COZAAR) 100 MG tablet    Sig: Take 100 mg by mouth daily.       Follow up plan: Return in about 4 weeks (around 02/03/2017) for smoking cessation, weight mangement.  Cassell Smiles, DNP, AGPCNP-BC Adult Gerontology Primary Care Nurse Practitioner [facility] 01/07/2017, 8:44 AM

## 2017-01-07 DIAGNOSIS — F5221 Male erectile disorder: Secondary | ICD-10-CM | POA: Insufficient documentation

## 2017-01-07 DIAGNOSIS — J449 Chronic obstructive pulmonary disease, unspecified: Secondary | ICD-10-CM

## 2017-01-07 DIAGNOSIS — I10 Essential (primary) hypertension: Secondary | ICD-10-CM

## 2017-01-07 DIAGNOSIS — N529 Male erectile dysfunction, unspecified: Secondary | ICD-10-CM | POA: Insufficient documentation

## 2017-01-07 DIAGNOSIS — M1610 Unilateral primary osteoarthritis, unspecified hip: Secondary | ICD-10-CM | POA: Insufficient documentation

## 2017-01-07 DIAGNOSIS — R635 Abnormal weight gain: Secondary | ICD-10-CM | POA: Insufficient documentation

## 2017-01-07 DIAGNOSIS — F17200 Nicotine dependence, unspecified, uncomplicated: Secondary | ICD-10-CM | POA: Insufficient documentation

## 2017-01-07 DIAGNOSIS — K219 Gastro-esophageal reflux disease without esophagitis: Secondary | ICD-10-CM | POA: Insufficient documentation

## 2017-01-07 HISTORY — DX: Gastro-esophageal reflux disease without esophagitis: K21.9

## 2017-01-07 HISTORY — DX: Chronic obstructive pulmonary disease, unspecified: J44.9

## 2017-01-07 HISTORY — DX: Essential (primary) hypertension: I10

## 2017-01-07 NOTE — Progress Notes (Signed)
I have reviewed this encounter including the documentation in this note and/or discussed this patient with the provider, Cassell Smiles, AGPCNP-BC. I am certifying that I agree with the content of this note as supervising physician.  Nobie Putnam, [facility] 01/07/2017, 5:14 PM

## 2017-02-03 DIAGNOSIS — G4733 Obstructive sleep apnea (adult) (pediatric): Secondary | ICD-10-CM | POA: Diagnosis not present

## 2017-02-08 ENCOUNTER — Ambulatory Visit (INDEPENDENT_AMBULATORY_CARE_PROVIDER_SITE_OTHER): Payer: Medicare Other | Admitting: Nurse Practitioner

## 2017-02-08 ENCOUNTER — Encounter: Payer: Self-pay | Admitting: Nurse Practitioner

## 2017-02-08 VITALS — BP 143/78 | HR 84 | Temp 98.2°F | Resp 16 | Ht 69.0 in | Wt 273.4 lb

## 2017-02-08 DIAGNOSIS — Z6841 Body Mass Index (BMI) 40.0 and over, adult: Secondary | ICD-10-CM | POA: Diagnosis not present

## 2017-02-08 DIAGNOSIS — Z716 Tobacco abuse counseling: Secondary | ICD-10-CM | POA: Diagnosis not present

## 2017-02-08 DIAGNOSIS — Z720 Tobacco use: Secondary | ICD-10-CM | POA: Diagnosis not present

## 2017-02-08 DIAGNOSIS — Y92009 Unspecified place in unspecified non-institutional (private) residence as the place of occurrence of the external cause: Secondary | ICD-10-CM

## 2017-02-08 DIAGNOSIS — W19XXXA Unspecified fall, initial encounter: Secondary | ICD-10-CM | POA: Diagnosis not present

## 2017-02-08 MED ORDER — BUPROPION HCL ER (SR) 150 MG PO TB12: ORAL_TABLET | ORAL | 1 refills | Status: DC

## 2017-02-08 NOTE — Patient Instructions (Addendum)
Nathaniel Little,  Thank you for coming in to clinic today.  1. For your smoking cessation: - START your wellbutrin on May 21st.  Set your quit date.  This is May 31st.  Don't buy any more cigarettes after May 21st. - We can continue this medicine for 12 weeks for smoking cessation.  We can also continue this medicine if your depression is improved and you want to continue taking it.  2. For your weight loss: - Look for meals that are lower fat.   - Your sodium goal is less than 2300 mg daily. - Aim for total calorie count of between 1200-1600 cal per day.  Look at both the calorie count per serving and how many servings are in a container. - Keep a log of everything you eat.  - GOAL: 1/2 lb to 1 lb per week.  Add more vegetables to your overall diet.  These can be frozen.  Please schedule a follow-up appointment with Cassell Smiles, AGNP to Return in about 2 months (around 04/10/2017) for smoking cessation.  If you have any other questions or concerns, please feel free to call the clinic or send a message through [place]. You may also schedule an earlier appointment if necessary.  Cassell Smiles, DNP, AGNP-BC Adult Gerontology Nurse Practitioner [facility], [facility]   Serving Sizes A serving size is a measured amount of food or drink, such as one slice of bread, that has an associated nutrient content. Knowing the serving size of a food or drink can help you determine how much of that food you should consume. What is the size of one serving? The size of one healthy serving depends on the food or drink. To determine a serving size, read the food label. If the food or drink does not have a food label, try to find serving size information online. Or, use the following to estimate the size of one adult serving: Grain  1 slice bread.  bagel.  cup pasta. Vegetable   cup cooked or canned vegetables. 1 cup raw, leafy greens. Fruit   cup canned fruit. 1 medium fruit.  cup dried  fruit. Meat and Other Protein Sources  1 oz meat, poultry, or fish.  cup cooked beans. 1 egg.  cup nuts or seeds. 1 Tbsp nut butter.  cup tofu or tempeh. 2 Tbsp hummus. Dairy  An individual container of yogurt (6-8 oz). 1 piece of cheese the size of your thumb (1 oz). 1 cup (8 oz) milk or milk alternative. Fat  A piece the size of one dice. 1 tsp soft margarine. 1 Tbsp mayonnaise. 1 tsp vegetable oil. 1 Tbsp regular salad dressing. 2 Tbsp low-fat salad dressing. How many servings should I eat from each food group each day? The following are the suggested number of servings to try and have every day from each food group. You can also look at your eating throughout the week and aim for meeting these requirements on most days for overall healthy eating. Grain  6-8 servings. Try to have half of your grains from whole grains, such as whole wheat bread, corn tortillas, oatmeal, brown rice, whole wheat pasta, and bulgur. Vegetable  At least 2-3 servings. Fruit  2 servings. Meat and Other Protein Foods  5-6 servings. Aim to have lean proteins, such as chicken, [place], fish, beans, or tofu. Dairy  3 servings. Choose low-fat or nonfat if you are trying to control your weight. Fat  2-3 servings. Is a serving the same thing  as a portion? No. A portion is the actual amount you eat, which may be more than one serving. Knowing the specific serving size of a food and the nutritional information that goes with it can help you make a healthy decision on what size portion to eat. What are some tips to help me learn healthy serving sizes?  Check food labels for serving sizes. Many foods that come as a single portion actually contain multiple servings.  Determine the serving size of foods you commonly eat and figure out how large a portion you usually eat.  Measure the number of servings that can be held by the bowls, glasses, cups, and plates you typically use. For example, pour your breakfast cereal  into your regular bowl and then pour it into a measuring cup.  For 1-2 days, measure the serving sizes of all the foods you eat.  Practice estimating serving sizes and determining how big your portions should be. This information is not intended to replace advice given to you by your health care provider. Make sure you discuss any questions you have with your health care provider. Document Released: 06/19/2003 Document Revised: 05/15/2016 Document Reviewed: 12/18/2013 Elsevier Interactive Patient Education  2017 [place] from [place] general, healthful diet is based on the 2010 Dietary Guidelines for Americans. The amount of food you need to eat from each food group depends on your age, sex, and level of physical activity and can be individualized by a dietitian. Go to [employer]hu for more information. What do I need to know about the MyPlate plan?  Enjoy your food, but eat less.  Avoid oversized portions.   of your plate should include fruits and vegetables.   of your plate should be grains.   of your plate should be protein. Grains   Make at least half of your grains whole grains.  For a 2,000 calorie daily food plan, eat 6 oz every day.  1 oz is about 1 slice bread, 1 cup cereal, or  cup cooked rice, cereal, or pasta. Vegetables   Make half your plate fruits and vegetables.  For a 2,000 calorie daily food plan, eat 2 cups every day.  1 cup is about 1 cup raw or cooked vegetables or vegetable juice or 2 cups raw leafy greens. Fruits   Make half your plate fruits and vegetables.  For a 2,000 calorie daily food plan, eat 2 cups every day.  1 cup is about 1 cup fruit or 100% fruit juice or  cup dried fruit. Protein   For a 2,000 calorie daily food plan, eat 5 oz every day.  1 oz is about 1 oz meat, poultry, or fish,  cup cooked beans, 1 egg, 1 Tbsp peanut butter, or  oz nuts or seeds. Dairy   Switch to fat-free or low-fat (1%)  milk.  For a 2,000 calorie daily food plan, eat 3 cups every day.  1 cup is about 1 cup milk or yogurt or soy milk (soy beverage), 1 oz natural cheese, or 2 oz processed cheese. Fats, Oils, and Empty Calories   Only small amounts of oils are recommended.  Empty calories are calories from solid fats or added sugars.  Compare sodium in foods like soup, bread, and frozen meals. Choose the foods with lower numbers.  Drink water instead of sugary drinks. What foods can I eat? Grains  Whole grains such as whole wheat, quinoa, millet, and bulgur. Bread, rolls, and pasta made from whole  grains. Brown or wild rice. Hot or cold cereals made from whole grains and without added sugar. Vegetables  All fresh vegetables, especially fresh red, dark green, or orange vegetables. Peas and beans. Low-sodium frozen or canned vegetables prepared without added salt. Low-sodium vegetable juices. Fruits  All fresh, frozen, and dried fruits. Canned fruit packed in water or fruit juice without added sugar. Fruit juices without added sugar. Meats and Other Protein Sources  Boiled, baked, or grilled lean meat trimmed of fat. Skinless poultry. Fresh seafood and shellfish. Canned seafood packed in water. Unsalted nuts and unsalted nut butters. Tofu. Dried beans and pea. Eggs. Dairy  Low-fat or fat-free milk, yogurt, and cheeses. Sweets and Desserts  Frozen desserts made from low-fat milk. Fats and Oils  Olive, peanut, and canola oils and margarine. Salad dressing and mayonnaise made from these oils. Other  Soups and casseroles made from allowed ingredients and without added fat or salt. The items listed above may not be a complete list of recommended foods or beverages. Contact your dietitian for more options.  What foods are not recommended? Grains  Sweetened, low-fiber cereals. Packaged baked goods. Snack crackers and chips. Cheese crackers, butter crackers, and biscuits. Frozen waffles, sweet breads,  doughnuts, pastries, packaged baking mixes, pancakes, cakes, and cookies. Vegetables  Regular canned or frozen vegetables or vegetables prepared with salt. Canned tomatoes. Canned tomato sauce. Fried vegetables. Vegetables in cream sauce or cheese sauce. Fruits  Fruits packed in syrup or made with added sugar. Meats and Other Protein Sources  Marbled or fatty meats such as ribs. Poultry with skin. Fried meats, poultry, eggs, or fish. Sausages, hot dogs, and deli meats such as pastrami, bologna, or salami. Dairy  Whole milk, cream, cheeses made from whole milk, sour cream. Ice cream or yogurt made from whole milk or with added sugar. Beverages  For adults, no more than one alcoholic drink per day. Regular soft drinks or other sugary beverages. Juice drinks. Sweets and Desserts  Sugary or fatty desserts, candy, and other sweets. Fats and Oils  Solid shortening or partially hydrogenated oils. Solid margarine. Margarine that contains trans fats. Butter. The items listed above may not be a complete list of foods and beverages to avoid. Contact your dietitian for more information.  This information is not intended to replace advice given to you by your health care provider. Make sure you discuss any questions you have with your health care provider. Document Released: 10/10/2007 Document Revised: 02/26/2016 Document Reviewed: 08/29/2013 Elsevier Interactive Patient Education  2017 [employer].

## 2017-02-08 NOTE — Progress Notes (Signed)
Subjective:    Patient ID: Nathaniel Little, male    DOB: [birth year], 71 y.o.   MRN: [id number]  Nathaniel Little is a 71 y.o. male presenting on 02/08/2017 for Follow-up (Patient here today to follow-up from 01/06/2017 for weight management and smoking cessation)   HPI Smoking 3 cigarettes per day.  But smoking about 6 x per day 1/2 cig.  Wants one after eating.  When daughter is home (he has asthma) no smoking - has cravings, but fights it.  Interested in starting wellbutrin.  PHQ-2: 1   Fell last week on Wednesday Michie over dogs.  Some pain still in R hip.  Comes and goes.  Known arthritis in R hip.  Can bear weight, but hurts after 5 minutes. He does not have any worsening hip pain after the fall.  Bruising on side despite falling face forward.  He did not extend arms and absorbed the fall by covering his face with his arms.    Weight Management Pt states only eating 1-2 meals per day.  Significant snacks consumed throughout the day revealed as we continued talking.  Cereal for breakfast, 1/2 gallon milk per week, crackers w/ peanut butter, spam.  Does eat marie Callendar[employer] or [employer] meals for dinner.  Sometimes a pot pie for lunch or spam. Occasionally cooks full meals.  Lack of vegetables in diet.  Eating fruits.    Notes he has started walking more, but is limited by safe place to walk.  Not walking as much as before he moved to [place].  Social History  Substance Use Topics  . Smoking status: Current Every Day Smoker    Packs/day: 0.30    Years: 68.00    Types: Cigarettes  . Smokeless tobacco: Former Systems developer    Quit date: 05/16/2016  . Alcohol use No    Review of Systems Per HPI unless specifically indicated above     Objective:    BP (!) 143/78 (BP Location: Right Arm, Patient Position: Sitting, Cuff Size: Large)   Pulse 84   Temp 98.2 F (36.8 C) (Oral)   Resp 16   Ht '5\' 9"'$  (1.753 m)   Wt 273 lb 6.4 oz (124 kg)   SpO2 96%   BMI 40.37 kg/m    Wt Readings from Last 3  Encounters:  02/08/17 273 lb 6.4 oz (124 kg)  01/06/17 271 lb (122.9 kg)  07/12/16 268 lb (121.6 kg)    Physical Exam  Constitutional: He is oriented to person, place, and time. He appears well-developed and well-nourished.  Obese  HENT:  Head: Normocephalic and atraumatic.  Mouth/Throat: Oropharynx is clear and moist.  Eyes: Conjunctivae are normal. Pupils are equal, round, and reactive to light.  Neck: Normal range of motion. Neck supple. No JVD present. No tracheal deviation present.  Cardiovascular: Normal rate, regular rhythm and normal heart sounds.   Pulmonary/Chest: Effort normal and breath sounds normal. No respiratory distress. He has no wheezes. He has no rales.  Abdominal: Bowel sounds are normal.  Lymphadenopathy:    He has no cervical adenopathy.  Neurological: He is alert and oriented to person, place, and time.  Skin: Skin is warm and dry.  Psychiatric: He has a normal mood and affect. His behavior is normal. Judgment and thought content normal.  Mood improved over last visit.  Pt seems more happy.    Results for orders placed or performed in visit on 01/06/17  TSH  Result Value Ref Range   TSH 1.22 0.40 -  4.50 mIU/L      Assessment & Plan:   Problem List Items Addressed This Visit      Other   BMI 40.0-44.9, adult ([place]) Continued weight gain of 3 lbs in 1 month - rate of gain has decreased since last visit.  Plan: 1. Discussed dietary recall.  Instructed to keep food log 2. Educated patient on serving sizes, macronutrients, calories and provided written information about healthy diet. 3. Calorie goal between 1200-1600 calories per day and sodium goal of around 2300 mg daily. 4. Weight loss goal 1/2=1lb per week.    Other Visit Diagnoses    Encounter for smoking cessation counseling    -  Primary Decreased by 0-1 cigarette per day in 1 month.  Pt with established quit date of 03/03/2017.  Plan: 1. Start bupropion 150 mg tablet one daily for 3 days.   Then take one tablet about every 12 hours for 7 weeks. Continue as needed up to 12 weeks for smoking cessation or longer if improved depression symptoms.   Relevant Medications   buPROPion (WELLBUTRIN SR) 150 MG 12 hr tablet   Fall at home, initial encounter     No obvious trauma indicated by HPI or exam.  Plan: 1. Avoid obstacles in walking paths at home. 2. Consider PT if additional fall occurs or if patient desires at a later date.  Refused PT today.      Meds ordered this encounter  Medications  . buPROPion (WELLBUTRIN SR) 150 MG 12 hr tablet    Sig: Day 1-3: Take 1 tablet once daily. Day 4: Take 1 tablet twice daily.    Dispense:  60 tablet    Refill:  1    Order Specific Question:   Supervising Provider    Answer:   Olin Hauser [[id number]]    Follow up plan: Return in about 2 months (around 04/10/2017) for smoking cessation.   Cassell Smiles, DNP, AGPCNP-BC Adult Gerontology Primary Care Nurse Practitioner [facility] 02/08/2017, 11:01 AM

## 2017-02-09 ENCOUNTER — Ambulatory Visit: Payer: Medicare Other | Admitting: Urology

## 2017-02-09 DIAGNOSIS — Z6838 Body mass index (BMI) 38.0-38.9, adult: Secondary | ICD-10-CM | POA: Insufficient documentation

## 2017-02-09 NOTE — Progress Notes (Signed)
I have reviewed this encounter including the documentation in this note and/or discussed this patient with the provider, Cassell Smiles, AGPCNP-BC. I am certifying that I agree with the content of this note as supervising physician.  Nobie Putnam, [facility] 02/09/2017, 9:09 PM

## 2017-02-25 ENCOUNTER — Ambulatory Visit
Admission: RE | Admit: 2017-02-25 | Discharge: 2017-02-25 | Disposition: A | Discharge status: Home / Self Care | Payer: Medicare Other | Source: Ambulatory Visit | Attending: Gastroenterology | Admitting: Gastroenterology

## 2017-02-25 ENCOUNTER — Encounter: Payer: Self-pay | Admitting: Anesthesiology

## 2017-02-25 ENCOUNTER — Encounter
Admission: RE | Disposition: A | Discharge status: Home / Self Care | Payer: Self-pay | Source: Ambulatory Visit | Attending: Gastroenterology

## 2017-02-25 ENCOUNTER — Ambulatory Visit: Payer: Medicare Other | Admitting: Anesthesiology

## 2017-02-25 DIAGNOSIS — Z79899 Other long term (current) drug therapy: Secondary | ICD-10-CM | POA: Insufficient documentation

## 2017-02-25 DIAGNOSIS — R112 Nausea with vomiting, unspecified: Secondary | ICD-10-CM | POA: Insufficient documentation

## 2017-02-25 DIAGNOSIS — K219 Gastro-esophageal reflux disease without esophagitis: Secondary | ICD-10-CM | POA: Diagnosis not present

## 2017-02-25 DIAGNOSIS — G4730 Sleep apnea, unspecified: Secondary | ICD-10-CM | POA: Insufficient documentation

## 2017-02-25 DIAGNOSIS — I10 Essential (primary) hypertension: Secondary | ICD-10-CM | POA: Insufficient documentation

## 2017-02-25 DIAGNOSIS — F329 Major depressive disorder, single episode, unspecified: Secondary | ICD-10-CM | POA: Diagnosis not present

## 2017-02-25 DIAGNOSIS — J449 Chronic obstructive pulmonary disease, unspecified: Secondary | ICD-10-CM | POA: Diagnosis not present

## 2017-02-25 DIAGNOSIS — Z539 Procedure and treatment not carried out, unspecified reason: Secondary | ICD-10-CM | POA: Insufficient documentation

## 2017-02-25 DIAGNOSIS — Z7982 Long term (current) use of aspirin: Secondary | ICD-10-CM | POA: Insufficient documentation

## 2017-02-25 DIAGNOSIS — Z6841 Body Mass Index (BMI) 40.0 and over, adult: Secondary | ICD-10-CM | POA: Insufficient documentation

## 2017-02-25 DIAGNOSIS — F17200 Nicotine dependence, unspecified, uncomplicated: Secondary | ICD-10-CM | POA: Insufficient documentation

## 2017-02-25 HISTORY — DX: Unspecified osteoarthritis, unspecified site: M19.90

## 2017-02-25 SURGERY — ESOPHAGOGASTRODUODENOSCOPY (EGD) WITH PROPOFOL
Anesthesia: General

## 2017-02-25 MED ORDER — SODIUM CHLORIDE 0.9 % IV SOLN: INTRAVENOUS | Status: DC

## 2017-02-25 MED ORDER — PROPOFOL 500 MG/50ML IV EMUL
INTRAVENOUS | Status: AC
  Filled 2017-02-25: qty 50

## 2017-02-25 MED ORDER — FENTANYL CITRATE (PF) 100 MCG/2ML IJ SOLN
INTRAMUSCULAR | Status: AC
  Filled 2017-02-25: qty 2

## 2017-02-25 NOTE — Anesthesia Preprocedure Evaluation (Deleted)
Anesthesia Evaluation  Patient identified by MRN, date of birth, ID band Patient awake    Reviewed: Allergy & Precautions, NPO status , Patient's Chart, lab work & pertinent test results, reviewed documented beta blocker date and time   Airway Mallampati: III  TM Distance: >3 FB     Dental  (+) Upper Dentures, Lower Dentures   Pulmonary sleep apnea and Continuous Positive Airway Pressure Ventilation , COPD, Current Smoker,           Cardiovascular hypertension, + Valvular Problems/Murmurs      Neuro/Psych PSYCHIATRIC DISORDERS Depression    GI/Hepatic GERD  ,  Endo/Other  Morbid obesity  Renal/GU      Musculoskeletal  (+) Arthritis ,   Abdominal   Peds  Hematology   Anesthesia Other Findings Does not take albuterol , but once every 2 weeks.  Reproductive/Obstetrics                            Anesthesia Physical Anesthesia Plan  ASA: III  Anesthesia Plan: General   Post-op Pain Management:    Induction: Intravenous  Airway Management Planned:   Additional Equipment:   Intra-op Plan:   Post-operative Plan:   Informed Consent: I have reviewed the patients History and Physical, chart, labs and discussed the procedure including the risks, benefits and alternatives for the proposed anesthesia with the patient or authorized representative who has indicated his/her understanding and acceptance.     Plan Discussed with: CRNA  Anesthesia Plan Comments:         Anesthesia Quick Evaluation

## 2017-03-06 DIAGNOSIS — G4733 Obstructive sleep apnea (adult) (pediatric): Secondary | ICD-10-CM | POA: Diagnosis not present

## 2017-03-09 ENCOUNTER — Ambulatory Visit: Payer: Medicare Other | Admitting: Urology

## 2017-03-09 ENCOUNTER — Encounter: Payer: Self-pay | Admitting: Urology

## 2017-04-05 DIAGNOSIS — G4733 Obstructive sleep apnea (adult) (pediatric): Secondary | ICD-10-CM | POA: Diagnosis not present

## 2017-04-14 ENCOUNTER — Ambulatory Visit (INDEPENDENT_AMBULATORY_CARE_PROVIDER_SITE_OTHER): Payer: Medicare Other | Admitting: Urology

## 2017-04-14 ENCOUNTER — Encounter: Payer: Self-pay | Admitting: Urology

## 2017-04-14 VITALS — BP 172/81 | HR 87 | Ht 69.0 in | Wt 263.6 lb

## 2017-04-14 DIAGNOSIS — N5235 Erectile dysfunction following radiation therapy: Secondary | ICD-10-CM | POA: Diagnosis not present

## 2017-04-14 DIAGNOSIS — Z8546 Personal history of malignant neoplasm of prostate: Secondary | ICD-10-CM | POA: Diagnosis not present

## 2017-04-14 MED ORDER — SILDENAFIL CITRATE 20 MG PO TABS: ORAL_TABLET | ORAL | 3 refills | Status: DC

## 2017-04-14 NOTE — Progress Notes (Signed)
04/14/2017 2:08 PM   Nathaniel Little [birth year] [id number]  Referring provider: [name], NP [place], [place] [address]  Chief Complaint  Patient presents with  . New Patient (Initial Visit)    ED referred by Cassell Smiles    HPI: The patient is a 71 year old woman with a past medical history of prostate cancer status post radiation therapy presents today with erectile dysfunction.    1. Erectile dysfunction The patient reports only being able to obtain a partial erection that is not sufficient for intercourse. This is been going on for many years. He has not been able to afford medication for this so he has not tried anything in the past. He is able to walk up a flight of stairs without difficulty. He denies cardiac issues outside of a murmur. He does not take nitrates.  2. History of prostate cancer Treated with brachytherapy and external beam radiation for unknown stage/grade. No recent PSAs.  PMH: Past Medical History:  Diagnosis Date  . Arthritis   . Cancer ([place]) 03/2013   Prostate  . COPD (chronic obstructive pulmonary disease) ([place])   . Depression   . Heart disease   . Heart murmur   . Hypertension   . Lymphedema   . Sleep apnea with use of continuous positive airway pressure (CPAP)     Surgical History: Past Surgical History:  Procedure Laterality Date  . ADENOIDECTOMY    . APPENDECTOMY    . TONSILLECTOMY      Home Medications:  Allergies as of 04/14/2017      Reactions   Codeine Itching      Medication List       Accurate as of 04/14/17  2:08 PM. Always use your most recent med list.          albuterol (2.5 MG/3ML) 0.083% nebulizer solution Commonly known as:  PROVENTIL Take 2.5 mg by nebulization every 6 (six) hours as needed for wheezing or shortness of breath.   albuterol 108 (90 Base) MCG/ACT inhaler Commonly known as:  PROVENTIL HFA;VENTOLIN HFA Inhale 2 puffs into the lungs every 6 (six) hours as needed for wheezing  or shortness of breath.   aspirin 81 MG tablet Take 81 mg by mouth daily.   buPROPion 150 MG 12 hr tablet Commonly known as:  WELLBUTRIN SR Day 1-3: Take 1 tablet once daily. Day 4: Take 1 tablet twice daily.   escitalopram 10 MG tablet Commonly known as:  LEXAPRO Take 10 mg by mouth daily. Reported on 04/14/2016   ipratropium 0.02 % nebulizer solution Commonly known as:  ATROVENT Take 2.5 mLs (0.5 mg total) by nebulization every 6 (six) hours as needed for wheezing or shortness of breath.   losartan 100 MG tablet Commonly known as:  COZAAR Take 100 mg by mouth daily.   omeprazole 40 MG capsule Commonly known as:  PRILOSEC Take 40 mg by mouth 3 times/day as needed-between meals & bedtime.   pantoprazole 40 MG tablet Commonly known as:  PROTONIX Take by mouth.   polyethylene glycol powder powder Commonly known as:  GLYCOLAX/MIRALAX Take 17 g by mouth 2 (two) times daily.   sildenafil 20 MG tablet Commonly known as:  REVATIO Take 1 to 5 tabs PO daily prn   sucralfate 1 GM/10ML suspension Commonly known as:  CARAFATE Take by mouth.       Allergies:  Allergies  Allergen Reactions  . Codeine Itching    Family History: Family History  Problem Relation  Age of Onset  . Breast cancer Mother   . Cancer Father        Black Lung  . Cerebral palsy Brother   . Tuberculosis Paternal Uncle   . Prostate cancer Neg Hx   . Kidney cancer Neg Hx   . Bladder Cancer Neg Hx     Social History:  reports that he has been smoking Cigarettes.  He has a 20.40 pack-year smoking history. He quit smokeless tobacco use about 10 months ago. He reports that he drinks alcohol. He reports that he does not use drugs.  ROS: UROLOGY Frequent Urination?: No Hard to postpone urination?: No Burning/pain with urination?: No Get up at night to urinate?: Yes Leakage of urine?: No Urine stream starts and stops?: Yes Trouble starting stream?: No Do you have to strain to urinate?: No Blood  in urine?: No Urinary tract infection?: No Sexually transmitted disease?: No Injury to kidneys or bladder?: No Painful intercourse?: No Weak stream?: No Erection problems?: Yes Penile pain?: No  Gastrointestinal Nausea?: No Vomiting?: No Indigestion/heartburn?: No Diarrhea?: No Constipation?: No  Constitutional Fever: No Night sweats?: No Weight loss?: No Fatigue?: No  Skin Skin rash/lesions?: Yes Itching?: Yes  Eyes Blurred vision?: No Double vision?: No  Ears/Nose/Throat Sore throat?: No Sinus problems?: No  Hematologic/Lymphatic Swollen glands?: No Easy bruising?: No  Cardiovascular Leg swelling?: No Chest pain?: No  Respiratory Cough?: No Shortness of breath?: Yes  Endocrine Excessive thirst?: Yes  Musculoskeletal Back pain?: Yes Joint pain?: Yes  Neurological Headaches?: No Dizziness?: No  Psychologic Depression?: Yes Anxiety?: No  Physical Exam: BP (!) 172/81   Pulse 87   Ht 5\' 9"  (1.753 m)   Wt 263 lb 9.6 oz (119.6 kg)   BMI 38.93 kg/m   Constitutional:  Alert and oriented, No acute distress. HEENT: [place] AT, moist mucus membranes.  Trachea midline, no masses. Cardiovascular: No clubbing, cyanosis, or edema. Respiratory: Normal respiratory effort, no increased work of breathing. GI: Abdomen is soft, nontender, nondistended, no abdominal masses GU: No CVA tenderness.  Skin: No rashes, bruises or suspicious lesions. Lymph: No cervical or inguinal adenopathy. Neurologic: Grossly intact, no focal deficits, moving all 4 extremities. Psychiatric: Normal mood and affect.  Laboratory Data: Lab Results  Component Value Date   WBC 7.3 07/12/2016   HGB 16.0 07/12/2016   HCT 45.1 07/12/2016   MCV 93.3 07/12/2016   PLT 192 07/12/2016    Lab Results  Component Value Date   CREATININE 0.91 07/12/2016    No results found for: PSA  No results found for: TESTOSTERONE  No results found for: HGBA1C  Urinalysis No results found for:  COLORURINE, APPEARANCEUR, LABSPEC, PHURINE, GLUCOSEU, HGBUR, BILIRUBINUR, KETONESUR, PROTEINUR, UROBILINOGEN, NITRITE, LEUKOCYTESUR   Assessment & Plan:    1. ED The patient was given a prescription for generic sildenafil 1 to 5 tabs by mouth daily as needed. He is warned of the risk of priapism and need for emergent intervention. He will follow-up in a few months to assess his progress.  2. History of prostate cancer -check PSA today  Return in about 3 months (around 07/15/2017).  Nickie Retort, MD  [facility] [address], [place],  [address] [phone]

## 2017-04-15 LAB — PSA: Prostate Specific Ag, Serum: 2.1 ng/mL (ref 0.0–4.0)

## 2017-04-20 ENCOUNTER — Ambulatory Visit (INDEPENDENT_AMBULATORY_CARE_PROVIDER_SITE_OTHER): Payer: Medicare Other | Admitting: Nurse Practitioner

## 2017-04-20 ENCOUNTER — Encounter: Payer: Self-pay | Admitting: Nurse Practitioner

## 2017-04-20 VITALS — BP 128/72 | HR 83 | Temp 98.1°F | Ht 69.0 in | Wt 262.4 lb

## 2017-04-20 DIAGNOSIS — Z716 Tobacco abuse counseling: Secondary | ICD-10-CM | POA: Diagnosis not present

## 2017-04-20 DIAGNOSIS — F329 Major depressive disorder, single episode, unspecified: Secondary | ICD-10-CM | POA: Diagnosis not present

## 2017-04-20 DIAGNOSIS — R011 Cardiac murmur, unspecified: Secondary | ICD-10-CM | POA: Diagnosis not present

## 2017-04-20 DIAGNOSIS — Z6838 Body mass index (BMI) 38.0-38.9, adult: Secondary | ICD-10-CM

## 2017-04-20 DIAGNOSIS — J449 Chronic obstructive pulmonary disease, unspecified: Secondary | ICD-10-CM | POA: Diagnosis not present

## 2017-04-20 DIAGNOSIS — F17210 Nicotine dependence, cigarettes, uncomplicated: Secondary | ICD-10-CM | POA: Diagnosis not present

## 2017-04-20 DIAGNOSIS — Z720 Tobacco use: Secondary | ICD-10-CM | POA: Diagnosis not present

## 2017-04-20 DIAGNOSIS — F32A Depression, unspecified: Secondary | ICD-10-CM

## 2017-04-20 MED ORDER — TIOTROPIUM BROMIDE MONOHYDRATE 18 MCG IN CAPS: 18.0000 ug | ORAL_CAPSULE | Freq: Every day | RESPIRATORY_TRACT | 5 refills | Status: DC

## 2017-04-20 MED ORDER — BUPROPION HCL ER (SR) 100 MG PO TB12: 100.0000 mg | ORAL_TABLET | Freq: Two times a day (BID) | ORAL | 5 refills | Status: DC

## 2017-04-20 MED ORDER — IPRATROPIUM-ALBUTEROL 0.5-2.5 (3) MG/3ML IN SOLN: 3.0000 mL | Freq: Four times a day (QID) | RESPIRATORY_TRACT | 3 refills | Status: DC | PRN

## 2017-04-20 NOTE — Assessment & Plan Note (Signed)
Stable harsh, holosystolic murmur.  Pt w/o symptoms of increased work of breathing or decreased exercise tolerance.  Plan: 1. Obtain prior echocardiogram from [place].  If unable, consider echocardiogram to evaluate murmur. 2. Follow up as needed and in 6 months.

## 2017-04-20 NOTE — Progress Notes (Signed)
Subjective:    Patient ID: Nathaniel Little, male    DOB: [birth year], 71 y.o.   MRN: [id number]  Nathaniel Little is a 71 y.o. male presenting on 04/20/2017 for Nicotine Dependence (quit smoking x 2.5weeks ago)   HPI Smoking cessation Has quit and stopped on his quit date.  Has not had cravings for cigarettes. He keeps an empty pack of cigarettes as a reminder that he has quit.  Has taken wellbutrin successfully w/o side effects.  He notes that his depression has greatly improved on this medication.   Weight Has lost 13 lbs in about 2 months. Eating salads, chicken/turkey, steak.  Chooses protein/energy bars. Has stopped eating pre-prepared frozen foods. Avoiding caffeinated soda, diet or lower sugar drinks.  NO coffee.  Is feeling a little better since making these lifestyle changes.   Pt is moving in September to independent senior housing for low income.  Living w/ daughter now.   COPD Pt has long-standing COPD which was previously managed by his PCP in [place].  He has not had any medication changes since coming to [facility].  Pt notes shortness of breath daily and is regularly using his albuterol inhaler 2-3 times daily.  He has not been using his albuterol/ipratropium nebulizer daily because his daughters dogs are afraid of the machine.  He is only using this as needed.  He is on no other long-acting inhalers.  Pt notes chronic cough w/ production.  Sputum is clear.  This is increasing in frequency.  Pt denies blood in sputum or difficulty with sleeping because of his cough.  Last pulmonary function testing was performed in [place].  Depression Pt notes significant improvement w/ his moods after starting his wellbutrin.  He would like to continue this with his escitalopram 10 mg once daily.  He now has interest in cooking his own meals and finds joy in doing so.  He was a former Biomedical scientist.  Depression screen [facility] 2/9 04/20/2017 01/06/2017 01/06/2017  Decreased Interest 3 0 0  Down, Depressed,  Hopeless 0 2 1  PHQ - 2 Score 3 2 1   Altered sleeping 1 1 -  Tired, decreased energy 0 2 -  Change in appetite 0 0 -  Feeling bad or failure about yourself  0 0 -  Trouble concentrating 0 0 -  Moving slowly or fidgety/restless 0 0 -  Suicidal thoughts 0 0 -  PHQ-9 Score 4 5 -  Difficult doing work/chores - Not difficult at all -     Social History  Substance Use Topics  . Smoking status: Former Smoker    Packs/day: 0.30    Years: 68.00    Types: Cigarettes    Quit date: 04/01/2017  . Smokeless tobacco: Former Systems developer    Quit date: 05/16/2016  . Alcohol use Yes     Comment: occ    Review of Systems Per HPI unless specifically indicated above     Objective:    BP 128/72 (BP Location: Right Arm, Patient Position: Sitting, Cuff Size: Large)   Pulse 83   Temp 98.1 F (36.7 C) (Oral)   Ht 5\' 9"  (1.753 m)   Wt 262 lb 6.4 oz (119 kg)   SpO2 97%   BMI 38.75 kg/m   Wt Readings from Last 3 Encounters:  04/20/17 262 lb 6.4 oz (119 kg)  04/14/17 263 lb 9.6 oz (119.6 kg)  02/25/17 275 lb (124.7 kg)    Physical Exam  General - obese, well-appearing, NAD HEENT - Normocephalic, atraumatic  Neck - supple, non-tender, no LAD, no thyromegaly, no carotid bruit Heart - RRR, grade 4/6 harsh, holosystolic crescendo murmur heard over 2nd intercostal space at left and right sternal borders Lungs - Clear throughout all lobes, no wheezing, crackles, or rhonchi. Normal work of breathing. Extremeties - non-tender, no edema, cap refill < 2 seconds, peripheral pulses intact +2 bilaterally Skin - warm, dry Neuro - awake, alert, oriented x3, antalgic gait walks with cane Psych - Normal mood and affect, normal behavior.  Improved mood over last visit.    Results for orders placed or performed in visit on 04/14/17  PSA  Result Value Ref Range   Prostate Specific Ag, Serum 2.1 0.0 - 4.0 ng/mL      Assessment & Plan:   Problem List Items Addressed This Visit      Respiratory   COPD (chronic  obstructive pulmonary disease) ([place])    Poorly controlled w/ some worsening productive cough.  Pt not taking any long acting inhalers for management.  Lung sounds improved over last visit after smoking cessation achieved.  Plan: 1. START Spiriva 18 mcg once daily. 2. Continue albuterol inhaler for rescue prn. 3. May also continue Duoneb nebulizer for rescue prn. 4. Obtain prior PFTs from [place].  Consider reevaluation w/ PFTs or pulmonology referral if no improvement. 5. Follow up 6 months or sooner if needed.      Relevant Medications   tiotropium (SPIRIVA HANDIHALER) 18 MCG inhalation capsule   ipratropium-albuterol (DUONEB) 0.5-2.5 (3) MG/3ML SOLN     Other   Heart murmur, systolic    Stable harsh, holosystolic murmur.  Pt w/o symptoms of increased work of breathing or decreased exercise tolerance.  Plan: 1. Obtain prior echocardiogram from [place].  If unable, consider echocardiogram to evaluate murmur. 2. Follow up as needed and in 6 months.      Depression    Stable and improved since last visit.  PHQ= 4 today down from 5, but pt affect and mood are improved w/ less anhedonia.    Plan: 1. Continue escitalopram 10 mg once daily.  2. Continue wellbutrin for moods at lower dose of 100 mg bid. 3. Follow up 6 months.      Relevant Medications   buPROPion (WELLBUTRIN SR) 100 MG 12 hr tablet   BMI 38.0-38.9,adult    Improving weight w/ loss of 13 lbs in approximately 6 weeks.  Pt w/ diet changes away from processed foods. Pt expresses confidence in abilities to prepare foods and feels better since making this change.  Plan: 1. Continue improved eating habits. 2. Celebrated weight loss.  Encouraged ongoing efforts. 3. Follow up 6 months.       Other Visit Diagnoses    Encounter for smoking cessation counseling    -  Primary Pt has been successfully able to quit smoking with wellbutrin w/ Quit date of April 02, 2017.  He reports no cravings and uses his last empty package  as a reminder to stay quit.   Plan: 1. Decrease bupropion to 100 mg bid.  Pt w/ improved depression on medication.  Will continue for that indication. 2. Follow up for counseling to stay quit as needed.   Relevant Medications   buPROPion (WELLBUTRIN SR) 100 MG 12 hr tablet  Discussion today >10 minutes specifically on counseling on risks of tobacco use, complications, treatment, smoking cessation and strategies to stay quit.  Instructed pt to call of cravings return.     Meds ordered this encounter  Medications  .  buPROPion (WELLBUTRIN SR) 100 MG 12 hr tablet    Sig: Take 1 tablet (100 mg total) by mouth 2 (two) times daily.    Dispense:  60 tablet    Refill:  5    Order Specific Question:   Supervising Provider    Answer:   Olin Hauser [[id number]]  . tiotropium (SPIRIVA HANDIHALER) 18 MCG inhalation capsule    Sig: Place 1 capsule (18 mcg total) into inhaler and inhale daily.    Dispense:  30 capsule    Refill:  5    Order Specific Question:   Supervising Provider    Answer:   Olin Hauser [[id number]]  . ipratropium-albuterol (DUONEB) 0.5-2.5 (3) MG/3ML SOLN    Sig: Take 3 mLs by nebulization every 6 (six) hours as needed (wheezing, shortness of breath).    Dispense:  360 mL    Refill:  3    Order Specific Question:   Supervising Provider    Answer:   Olin Hauser [[id number]]      Follow up plan: Return in about 6 months (around 10/21/2017) for depression, .   Cassell Smiles, DNP, AGPCNP-BC Adult Gerontology Primary Care Nurse Practitioner [facility] 04/20/2017, 12:36 PM

## 2017-04-20 NOTE — Progress Notes (Signed)
I have reviewed this encounter including the documentation in this note and/or discussed this patient with the provider, Cassell Smiles, AGPCNP-BC. I am certifying that I agree with the content of this note as supervising physician.  Nobie Putnam, [facility] 04/20/2017, 1:30 PM

## 2017-04-20 NOTE — Patient Instructions (Addendum)
Jearld, Thank you for coming in to clinic today.  1. For your smoking cessation: GREAT WORK!!! We are going to lower your bupropion dose to 100 mg twice daily.  If you start having cravings, call the clinic.  We are continuing this medication for your depression.  2. For your weight loss: GREAT WORK!!!  Keep up your efforts for cooking your own meals!  3. Call the clinic if you need any refills once you get home.  4. For your coughing, if it gets worse let me know. - we may do lung function testing again or use other inhalers. - has not needed his nebulizer. - Use albuterol nebulizer as needed. - START taking your ipratropium bromide inhaler (Spiriva) - CONTINUE your albuterol inhaler as needed for shortness of breath.  Please schedule a follow-up appointment with Cassell Smiles, AGNP to Return in about 6 months (around 10/21/2017) for depression.  If you have any other questions or concerns, please feel free to call the clinic or send a message through [place]. You may also schedule an earlier appointment if necessary.  Cassell Smiles, DNP, AGNP-BC Adult Gerontology Nurse Practitioner [place]

## 2017-04-20 NOTE — Assessment & Plan Note (Addendum)
Poorly controlled w/ some worsening productive cough.  Pt not taking any long acting inhalers for management.  Lung sounds improved over last visit after smoking cessation achieved.  Plan: 1. START Spiriva 18 mcg once daily. 2. Continue albuterol inhaler for rescue prn. 3. May also continue Duoneb nebulizer for rescue prn. 4. Obtain prior PFTs from [place].  Consider reevaluation w/ PFTs or pulmonology referral if no improvement. 5. Follow up 6 months or sooner if needed.

## 2017-04-20 NOTE — Assessment & Plan Note (Signed)
Stable and improved since last visit.  PHQ= 4 today down from 5, but pt affect and mood are improved w/ less anhedonia.    Plan: 1. Continue escitalopram 10 mg once daily.  2. Continue wellbutrin for moods at lower dose of 100 mg bid. 3. Follow up 6 months.

## 2017-04-20 NOTE — Assessment & Plan Note (Signed)
Improving weight w/ loss of 13 lbs in approximately 6 weeks.  Pt w/ diet changes away from processed foods. Pt expresses confidence in abilities to prepare foods and feels better since making this change.  Plan: 1. Continue improved eating habits. 2. Celebrated weight loss.  Encouraged ongoing efforts. 3. Follow up 6 months.

## 2017-04-25 ENCOUNTER — Telehealth: Payer: Self-pay | Admitting: Family Medicine

## 2017-04-25 NOTE — Telephone Encounter (Signed)
Patient notified

## 2017-04-25 NOTE — Telephone Encounter (Signed)
-----   Message from Nickie Retort, MD sent at 04/21/2017 12:49 PM EDT ----- Please let patient know his PSA is 2.1. Since we don't have prior PSAs, we dont know if this is normal for him. Please have him repeat PSA before his next appt in a few months. Thanks   ----- Message ----- From: Kyra Manges, CMA Sent: 04/15/2017   8:37 AM To: Nickie Retort, MD    ----- Message ----- From: Interface, [facility] Lab Results In Sent: 04/15/2017   5:42 AM To: Rowe Robert Clinical

## 2017-05-06 DIAGNOSIS — G4733 Obstructive sleep apnea (adult) (pediatric): Secondary | ICD-10-CM | POA: Diagnosis not present

## 2017-05-10 DIAGNOSIS — G4733 Obstructive sleep apnea (adult) (pediatric): Secondary | ICD-10-CM | POA: Diagnosis not present

## 2017-07-15 ENCOUNTER — Ambulatory Visit: Payer: Medicare Other | Admitting: Urology

## 2017-07-20 ENCOUNTER — Ambulatory Visit: Payer: Medicare Other

## 2017-07-25 ENCOUNTER — Emergency Department: Payer: Medicare Other

## 2017-07-25 ENCOUNTER — Encounter: Payer: Self-pay | Admitting: Emergency Medicine

## 2017-07-25 ENCOUNTER — Telehealth: Payer: Self-pay | Admitting: Oncology

## 2017-07-25 ENCOUNTER — Encounter: Payer: Self-pay | Admitting: Nurse Practitioner

## 2017-07-25 ENCOUNTER — Telehealth: Payer: Self-pay

## 2017-07-25 ENCOUNTER — Other Ambulatory Visit: Payer: Self-pay | Admitting: *Deleted

## 2017-07-25 ENCOUNTER — Emergency Department
Admission: EM | Admit: 2017-07-25 | Discharge: 2017-07-25 | Disposition: A | Discharge status: Home / Self Care | Payer: Medicare Other | Attending: Emergency Medicine | Admitting: Emergency Medicine

## 2017-07-25 DIAGNOSIS — C259 Malignant neoplasm of pancreas, unspecified: Secondary | ICD-10-CM | POA: Diagnosis not present

## 2017-07-25 DIAGNOSIS — Z8546 Personal history of malignant neoplasm of prostate: Secondary | ICD-10-CM | POA: Diagnosis not present

## 2017-07-25 DIAGNOSIS — R05 Cough: Secondary | ICD-10-CM | POA: Diagnosis not present

## 2017-07-25 DIAGNOSIS — R0602 Shortness of breath: Secondary | ICD-10-CM | POA: Diagnosis present

## 2017-07-25 DIAGNOSIS — K5900 Constipation, unspecified: Secondary | ICD-10-CM | POA: Diagnosis not present

## 2017-07-25 DIAGNOSIS — Z7982 Long term (current) use of aspirin: Secondary | ICD-10-CM | POA: Diagnosis not present

## 2017-07-25 DIAGNOSIS — I700 Atherosclerosis of aorta: Secondary | ICD-10-CM | POA: Insufficient documentation

## 2017-07-25 DIAGNOSIS — Z79899 Other long term (current) drug therapy: Secondary | ICD-10-CM | POA: Insufficient documentation

## 2017-07-25 DIAGNOSIS — Z87891 Personal history of nicotine dependence: Secondary | ICD-10-CM | POA: Diagnosis not present

## 2017-07-25 DIAGNOSIS — J441 Chronic obstructive pulmonary disease with (acute) exacerbation: Secondary | ICD-10-CM | POA: Insufficient documentation

## 2017-07-25 DIAGNOSIS — I10 Essential (primary) hypertension: Secondary | ICD-10-CM | POA: Insufficient documentation

## 2017-07-25 DIAGNOSIS — R0600 Dyspnea, unspecified: Secondary | ICD-10-CM | POA: Diagnosis not present

## 2017-07-25 HISTORY — DX: Atherosclerosis of aorta: I70.0

## 2017-07-25 LAB — URINALYSIS, COMPLETE (UACMP) WITH MICROSCOPIC
Bacteria, UA: NONE SEEN
Bilirubin Urine: NEGATIVE
GLUCOSE, UA: 50 mg/dL — AB
HGB URINE DIPSTICK: NEGATIVE
Ketones, ur: NEGATIVE mg/dL
Leukocytes, UA: NEGATIVE
Nitrite: NEGATIVE
PH: 7 (ref 5.0–8.0)
Protein, ur: 30 mg/dL — AB
SPECIFIC GRAVITY, URINE: 1.016 (ref 1.005–1.030)

## 2017-07-25 LAB — CBC WITH DIFFERENTIAL/PLATELET
BASOS ABS: 0 10*3/uL (ref 0–0.1)
Basophils Relative: 0 %
EOS ABS: 0.1 10*3/uL (ref 0–0.7)
EOS PCT: 2 %
HCT: 44.6 % (ref 40.0–52.0)
HEMOGLOBIN: 15.2 g/dL (ref 13.0–18.0)
LYMPHS PCT: 11 %
Lymphs Abs: 0.9 10*3/uL — ABNORMAL LOW (ref 1.0–3.6)
MCH: 31.3 pg (ref 26.0–34.0)
MCHC: 34 g/dL (ref 32.0–36.0)
MCV: 91.9 fL (ref 80.0–100.0)
Monocytes Absolute: 0.5 10*3/uL (ref 0.2–1.0)
Monocytes Relative: 6 %
NEUTROS PCT: 81 %
Neutro Abs: 6.3 10*3/uL (ref 1.4–6.5)
PLATELETS: 206 10*3/uL (ref 150–440)
RBC: 4.86 MIL/uL (ref 4.40–5.90)
RDW: 14.3 % (ref 11.5–14.5)
WBC: 7.8 10*3/uL (ref 3.8–10.6)

## 2017-07-25 LAB — BASIC METABOLIC PANEL
Anion gap: 12 (ref 5–15)
BUN: 24 mg/dL — AB (ref 6–20)
CALCIUM: 9.5 mg/dL (ref 8.9–10.3)
CHLORIDE: 102 mmol/L (ref 101–111)
CO2: 25 mmol/L (ref 22–32)
CREATININE: 1.27 mg/dL — AB (ref 0.61–1.24)
GFR calc non Af Amer: 55 mL/min — ABNORMAL LOW (ref >60)
Glucose, Bld: 208 mg/dL — ABNORMAL HIGH (ref 65–99)
Potassium: 3.7 mmol/L (ref 3.5–5.1)
SODIUM: 139 mmol/L (ref 135–145)

## 2017-07-25 LAB — HEPATIC FUNCTION PANEL
ALK PHOS: 78 U/L (ref 38–126)
ALT: 29 U/L (ref 17–63)
AST: 32 U/L (ref 15–41)
Albumin: 3.8 g/dL (ref 3.5–5.0)
BILIRUBIN INDIRECT: 0.5 mg/dL (ref 0.3–0.9)
BILIRUBIN TOTAL: 0.6 mg/dL (ref 0.3–1.2)
Bilirubin, Direct: 0.1 mg/dL (ref 0.1–0.5)
TOTAL PROTEIN: 7.1 g/dL (ref 6.5–8.1)

## 2017-07-25 LAB — TROPONIN I

## 2017-07-25 LAB — BRAIN NATRIURETIC PEPTIDE: B Natriuretic Peptide: 40 pg/mL (ref 0.0–100.0)

## 2017-07-25 MED ORDER — IPRATROPIUM-ALBUTEROL 0.5-2.5 (3) MG/3ML IN SOLN
3.0000 mL | Freq: Once | RESPIRATORY_TRACT | Status: AC
  Administered 2017-07-25: 3 mL via RESPIRATORY_TRACT
  Filled 2017-07-25: qty 3

## 2017-07-25 MED ORDER — IPRATROPIUM-ALBUTEROL 0.5-2.5 (3) MG/3ML IN SOLN: 3.0000 mL | Freq: Once | RESPIRATORY_TRACT | Status: DC

## 2017-07-25 MED ORDER — PREDNISONE 10 MG PO TABS: 50.0000 mg | ORAL_TABLET | Freq: Every day | ORAL | 0 refills | Status: AC

## 2017-07-25 MED ORDER — SPACER/AERO CHAMBER MOUTHPIECE MISC: 1.0000 [IU] | 0 refills | Status: DC | PRN

## 2017-07-25 MED ORDER — AZITHROMYCIN 250 MG PO TABS: ORAL_TABLET | ORAL | 0 refills | Status: AC

## 2017-07-25 MED ORDER — DEXTROSE 5 % IV SOLN
500.0000 mg | Freq: Once | INTRAVENOUS | Status: AC
  Administered 2017-07-25: 500 mg via INTRAVENOUS
  Filled 2017-07-25: qty 500

## 2017-07-25 MED ORDER — ALBUTEROL SULFATE HFA 108 (90 BASE) MCG/ACT IN AERS: 2.0000 | INHALATION_SPRAY | Freq: Four times a day (QID) | RESPIRATORY_TRACT | 0 refills | Status: DC | PRN

## 2017-07-25 MED ORDER — IOPAMIDOL (ISOVUE-300) INJECTION 61%
100.0000 mL | Freq: Once | INTRAVENOUS | Status: AC | PRN
  Administered 2017-07-25: 100 mL via INTRAVENOUS

## 2017-07-25 MED ORDER — METHYLPREDNISOLONE SODIUM SUCC 125 MG IJ SOLR
125.0000 mg | Freq: Once | INTRAMUSCULAR | Status: AC
  Administered 2017-07-25: 125 mg via INTRAVENOUS
  Filled 2017-07-25: qty 2

## 2017-07-25 NOTE — Discharge Instructions (Signed)
Please take your steroids and antibiotics as prescribed and make sure you follow up with the oncologist this coming Friday as scheduled.  You need to call the office tomorrow to confirm the appointment but the oncologist is expecting you.  Return to the emergency department for any new or worsening symptoms such as fevers, chills, worsening shortness of breath, or for any other concerns whatsoever.  It was a pleasure to take care of you today, and thank you for coming to our emergency department.  If you have any questions or concerns before leaving please ask the nurse to grab me and I'm more than happy to go through your aftercare instructions again.  If you were prescribed any opioid pain medication today such as Norco, Vicodin, Percocet, morphine, hydrocodone, or oxycodone please make sure you do not drive when you are taking this medication as it can alter your ability to drive safely.  If you have any concerns once you are home that you are not improving or are in fact getting worse before you can make it to your follow-up appointment, please do not hesitate to call 911 and come back for further evaluation.  Darel Hong, MD  Results for orders placed or performed during the hospital encounter of 60/10/93  Basic metabolic panel  Result Value Ref Range   Sodium 139 135 - 145 mmol/L   Potassium 3.7 3.5 - 5.1 mmol/L   Chloride 102 101 - 111 mmol/L   CO2 25 22 - 32 mmol/L   Glucose, Bld 208 (H) 65 - 99 mg/dL   BUN 24 (H) 6 - 20 mg/dL   Creatinine, Ser 1.27 (H) 0.61 - 1.24 mg/dL   Calcium 9.5 8.9 - 10.3 mg/dL   GFR calc non Af Amer 55 (L) >60 mL/min   GFR calc Af Amer >60 >60 mL/min   Anion gap 12 5 - 15  Hepatic function panel  Result Value Ref Range   Total Protein 7.1 6.5 - 8.1 g/dL   Albumin 3.8 3.5 - 5.0 g/dL   AST 32 15 - 41 U/L   ALT 29 17 - 63 U/L   Alkaline Phosphatase 78 38 - 126 U/L   Total Bilirubin 0.6 0.3 - 1.2 mg/dL   Bilirubin, Direct 0.1 0.1 - 0.5 mg/dL   Indirect Bilirubin 0.5 0.3 - 0.9 mg/dL  CBC with Differential  Result Value Ref Range   WBC 7.8 3.8 - 10.6 K/uL   RBC 4.86 4.40 - 5.90 MIL/uL   Hemoglobin 15.2 13.0 - 18.0 g/dL   HCT 44.6 40.0 - 52.0 %   MCV 91.9 80.0 - 100.0 fL   MCH 31.3 26.0 - 34.0 pg   MCHC 34.0 32.0 - 36.0 g/dL   RDW 14.3 11.5 - 14.5 %   Platelets 206 150 - 440 K/uL   Neutrophils Relative % 81 %   Neutro Abs 6.3 1.4 - 6.5 K/uL   Lymphocytes Relative 11 %   Lymphs Abs 0.9 (L) 1.0 - 3.6 K/uL   Monocytes Relative 6 %   Monocytes Absolute 0.5 0.2 - 1.0 K/uL   Eosinophils Relative 2 %   Eosinophils Absolute 0.1 0 - 0.7 K/uL   Basophils Relative 0 %   Basophils Absolute 0.0 0 - 0.1 K/uL  Troponin I  Result Value Ref Range   Troponin I <0.03 <0.03 ng/mL  Brain natriuretic peptide  Result Value Ref Range   B Natriuretic Peptide 40.0 0.0 - 100.0 pg/mL  Urinalysis, Complete w Microscopic  Result Value  Ref Range   Color, Urine YELLOW (A) YELLOW   APPearance CLEAR (A) CLEAR   Specific Gravity, Urine 1.016 1.005 - 1.030   pH 7.0 5.0 - 8.0   Glucose, UA 50 (A) NEGATIVE mg/dL   Hgb urine dipstick NEGATIVE NEGATIVE   Bilirubin Urine NEGATIVE NEGATIVE   Ketones, ur NEGATIVE NEGATIVE mg/dL   Protein, ur 30 (A) NEGATIVE mg/dL   Nitrite NEGATIVE NEGATIVE   Leukocytes, UA NEGATIVE NEGATIVE   RBC / HPF 0-5 0 - 5 RBC/hpf   WBC, UA 0-5 0 - 5 WBC/hpf   Bacteria, UA NONE SEEN NONE SEEN   Squamous Epithelial / LPF 0-5 (A) NONE SEEN   Mucus PRESENT    Ct Abdomen Pelvis W Contrast  Result Date: 07/25/2017 CLINICAL DATA:  Shortness of breath and cough for 2 weeks. Constipation. EXAM: CT ABDOMEN AND PELVIS WITH CONTRAST TECHNIQUE: Multidetector CT imaging of the abdomen and pelvis was performed using the standard protocol following bolus administration of intravenous contrast. CONTRAST:  129mL ISOVUE-300 IOPAMIDOL (ISOVUE-300) INJECTION 61% COMPARISON:  Abdomen radiographs from 07/12/2016 FINDINGS: Lower chest: Faint  hypodensity in the right lower lobe pulmonary artery on images 1-2 of series 2 is probably most attributable to the motion artifact present in the right lung on these images. Subsegmental atelectasis or scarring in the lingula. Coronary and descending thoracic aortic atherosclerotic calcification. Hepatobiliary: Diffuse hepatic steatosis with slight sparing along the gallbladder fossa. No biliary dilatation. Gallbladder unremarkable. Pancreas: Dilated dorsal pancreatic duct especially along a 4.5 cm segment of the duct in the pancreatic body where the dorsal pancreatic duct measures up to 12.3 cm in diameter. Cyst dorsal to this there is a fairly low density 2.0 by 3.3 by 3.6 cm mass. Irregular calcification measuring 1.7 by 1.0 by 0.9 cm in the dorsal pancreatic duct in the pancreatic head. Separate calcification along the dorsal pancreatic duct in the pancreatic tail measuring 1.3 by 0.7 by 0.7 cm. Indistinct hypodensity in the pancreatic head centrally possibly from dilated duct but technically nonspecific. Spleen: Unremarkable Adrenals/Urinary Tract: There is contrast medium in the collecting systems due to an initial injection with IV injection failure. Multiple small indistinct hypodense lesions in both kidneys are technically too small to characterize, although statistically likely to be benign. One lesion is Complex on image 32/2, suggesting a chronic complex cyst but potentially meriting surveillance. Sensitivity for nonobstructive renal calculi is low due to the pre-existing contrast medium along the urothelium. Urinary bladder unremarkable. Stomach/Bowel: Tortuosity of the descending duodenum observed. Otherwise unremarkable Vascular/Lymphatic: Aortoiliac atherosclerotic vascular disease. No pathologic adenopathy. Reproductive: Fiducials are present along the margins of the prostate gland. Other: No supplemental non-categorized findings. Musculoskeletal: Fatty left spermatic cord. Moderate degenerative  arthropathy of both hips. Considerable lumbar spondylosis with grade 1 anterolisthesis at L4-5 at L5-S1 and impingement at all levels between L1 and S1. The most notable central narrowing of the thecal sac is at the L3-4 and L4-5 levels. IMPRESSION: 1. Notably abnormal appearance of the pancreas with several large calculi along the dorsal pancreatic duct ; a prominently dilated segment of the dorsal pancreatic duct in the pancreatic body; and a cystic mass along the upper margin of the pancreatic body measuring up to 3.6 cm in long axis with suspected internal septations. This is most likely a cystic serous neoplasm but is not entirely specific. Given the pancreatic abnormalities and cystic mass, pancreatic protocol MRI with and without contrast is recommended for further workup. 2. Diffuse hepatic steatosis. 3.  Aortic Atherosclerosis (ICD10-I70.0).  Coronary atherosclerosis. 4. Considerable lumbar spondylosis and degenerative disc disease causing impingement at all lumbar levels. 5. Fiducials along the margins of the prostate gland suggesting prior treatment for prostate cancer, correlate with patient history. No adenopathy or compelling findings of active malignancy. 6. There is faint hypodensity in the right lower lobe pulmonary artery on images 1-2 of series 2. This is probably due to motion artifact in the right lung. The appearance is not considered specific for pulmonary embolus given the degree of motion artifact, however if there are risk factors or concerns for pulmonary embolus based on patient history then dedicated imaging workup for pulmonary embolus may be warranted. Electronically Signed   By: Van Clines M.D.   On: 07/25/2017 14:49   Dg Chest Port 1 View  Result Date: 07/25/2017 CLINICAL DATA:  Increasing shortness of breath and cough. EXAM: PORTABLE CHEST 1 VIEW COMPARISON:  Chest x-rays dated July 12, 2016 and June 14, 2016. FINDINGS: The cardiomediastinal silhouette is normal  in size. Normal pulmonary vascularity. Bibasilar atelectasis No focal consolidation, pleural effusion, or pneumothorax. No acute osseous abnormality. IMPRESSION: Bibasilar atelectasis.  No active cardiopulmonary disease. Electronically Signed   By: Titus Dubin M.D.   On: 07/25/2017 12:42

## 2017-07-25 NOTE — Telephone Encounter (Signed)
  The pt called w/ severe SOB that have worsen in the last 2 weeks. He states today it's hard to get a deep breath. He reports that he used his Albuterol inhaler and duoneb treatment, but no improvement. He reports that he gives out of air just walking to his mailbox and back. The pt sound SOB on the phone. I recommended that the pt go to the ER to be evaluated since his symptoms are worsening and now he's having a hard time catching his breath even after treatment. He verbalize understanding.

## 2017-07-25 NOTE — ED Triage Notes (Signed)
Pt in via [facility] from home with complaints of increasing shortness of breath and cough x 2 weeks, pt also complains of N/V, constipation, unable to tell when his last BM was.  Pt hypertensive upon arrival, other vitals WDL.

## 2017-07-25 NOTE — Telephone Encounter (Signed)
Consult for Pancreatic Ca. ER Referral/Dr. Darel Hong.  Notes in Epic. Schd for 10/26 @ 9:00 a.m.per DR Rao/Sherry/schd msg & Verbal.  New patient packet left at Registration for patient to complete upon arrival. MF

## 2017-07-25 NOTE — ED Provider Notes (Signed)
[facility] Emergency Department Provider Note  ____________________________________________   First MD Initiated Contact with Patient 07/25/17 1221     (approximate)  I have reviewed the triage vital signs and the nursing notes.   HISTORY  Chief Complaint Shortness of Breath   HPI Nathaniel Little is a 71 y.o. male who comes to the emergency department via EMS with 1 day of progressive worsening shortness of breath. He has had an increasing productive cough and shortness of breath the past 2-3 weeks but today it was acutely worse. He also says that he has not been able to pass a solid bowel movement in "some time". He did have a hard stool last night along with a significant amount of flatus. He feels like his abdomen is significantly distended. Uses CPAP at night but does not use oxygen.   Past Medical History:  Diagnosis Date  . Arthritis   . Cancer ([place]) 03/2013   Prostate  . COPD (chronic obstructive pulmonary disease) ([place])   . Depression   . Heart disease   . Heart murmur   . Hypertension   . Lymphedema   . Sleep apnea with use of continuous positive airway pressure (CPAP)     Patient Active Problem List   Diagnosis Date Noted  . Aortic atherosclerosis ([place]) 07/25/2017  . BMI 38.0-38.9,adult 02/09/2017  . Arthritis pain of hip 01/07/2017  . COPD (chronic obstructive pulmonary disease) ([place]) 01/07/2017  . GERD (gastroesophageal reflux disease) 01/07/2017  . Inability to attain erection 01/07/2017  . Tobacco smoker within last 12 months 01/07/2017  . Hypertension 01/07/2017  . Heart murmur, systolic 99/37/1696  . Depression 01/06/2017  . Sleep apnea with use of continuous positive airway pressure (CPAP) 01/06/2017  . Personal history of prostate cancer 03/04/2013    Past Surgical History:  Procedure Laterality Date  . ADENOIDECTOMY    . APPENDECTOMY    . TONSILLECTOMY      Prior to Admission medications   Medication Sig Start Date  End Date Taking? Authorizing Provider  albuterol (PROVENTIL HFA;VENTOLIN HFA) 108 (90 Base) MCG/ACT inhaler Inhale 2 puffs into the lungs every 6 (six) hours as needed for wheezing or shortness of breath. 06/14/16  Yes Joanne Gavel, MD  aspirin 81 MG tablet Take 81 mg by mouth daily.   Yes [provider]  buPROPion (WELLBUTRIN SR) 100 MG 12 hr tablet Take 1 tablet (100 mg total) by mouth 2 (two) times daily. 04/20/17  Yes [name], NP  escitalopram (LEXAPRO) 10 MG tablet Take 10 mg by mouth daily. Reported on 04/14/2016   Yes [provider]  ipratropium-albuterol (DUONEB) 0.5-2.5 (3) MG/3ML SOLN Take 3 mLs by nebulization every 6 (six) hours as needed (wheezing, shortness of breath). 04/20/17  Yes [name], NP  losartan (COZAAR) 100 MG tablet Take 100 mg by mouth daily.   Yes [provider]  omeprazole (PRILOSEC) 40 MG capsule Take 40 mg by mouth 3 times/day as needed-between meals & bedtime. 11/11/16  Yes [provider]  pantoprazole (PROTONIX) 40 MG tablet Take by mouth. 02/25/17  Yes [provider]  polyethylene glycol powder (GLYCOLAX/MIRALAX) powder Take 17 g by mouth 2 (two) times daily.   Yes [provider]  sildenafil (REVATIO) 20 MG tablet Take 1 to 5 tabs PO daily prn 04/14/17  Yes Nickie Retort, MD  tiotropium (SPIRIVA HANDIHALER) 18 MCG inhalation capsule Place 1 capsule (18 mcg total) into inhaler and inhale daily. 04/20/17  Yes [name], NP  albuterol (PROVENTIL HFA;VENTOLIN HFA) 108 (90 Base) MCG/ACT inhaler Inhale 2 puffs into the lungs every 6 (six) hours as needed for wheezing or shortness of breath. 07/25/17   Darel Hong, MD  azithromycin (ZITHROMAX Z-PAK) 250 MG tablet Take 2 tablets (500 mg) on  Day 1,  followed by 1 tablet (250 mg) once daily on Days 2 through 5. 07/25/17 07/30/17  Darel Hong, MD  predniSONE (DELTASONE) 10 MG tablet Take 5 tablets (50 mg total) by mouth  daily. 07/25/17 07/29/17  Darel Hong, MD  Spacer/Aero Chamber Mouthpiece MISC 1 Units by Does not apply route every 4 (four) hours as needed (wheezing). 07/25/17   Darel Hong, MD    Allergies Codeine  Family History  Problem Relation Age of Onset  . Breast cancer Mother   . Cancer Father        Black Lung  . Cerebral palsy Brother   . Tuberculosis Paternal Uncle   . Prostate cancer Neg Hx   . Kidney cancer Neg Hx   . Bladder Cancer Neg Hx     Social History Social History  Substance Use Topics  . Smoking status: Former Smoker    Packs/day: 0.50    Years: 68.00    Types: Cigarettes    Quit date: 04/01/2017  . Smokeless tobacco: Former Systems developer    Quit date: 05/16/2016  . Alcohol use Yes     Comment: occ    Review of Systems Constitutional: No fever/chills Eyes: No visual changes. ENT: No sore throat. Cardiovascular: negative for chest pain. Respiratory: positive shortness of breath. Gastrointestinal: No abdominal pain.  No nausea, no vomiting.  No diarrhea.  positive for constipation. Genitourinary: Negative for dysuria. Musculoskeletal: Negative for back pain. Skin: Negative for rash. Neurological: Negative for headaches, focal weakness or numbness.   ____________________________________________   PHYSICAL EXAM:  VITAL SIGNS: ED Triage Vitals  Enc Vitals Group     BP      Pulse      Resp      Temp      Temp src      SpO2      Weight      Height      Head Circumference      Peak Flow      Pain Score      Pain Loc      Pain Edu?      Excl. in [place]?     Constitutional: alert and oriented 4 appears clearly short of breath speaking in 4-5 word sentences with elevated respiratory rate Eyes: PERRL EOMI. Head: Atraumatic. Nose: No congestion/rhinnorhea. Mouth/Throat: No trismus Neck: No stridor.   Cardiovascular: Normal rate, regular rhythm. Grossly normal heart sounds.  Good peripheral circulation. Respiratory: increased respiratory effort  using mild accessory muscles moving somewhat limited amounts of air with trace wheezes bilaterally no rhonchi appreciated Gastrointestinal: distended abdomen soft nontender no rebound or guarding no peritonitis Musculoskeletal: No lower extremity edema   Neurologic:  Normal speech and language. No gross focal neurologic deficits are appreciated. Skin:  Skin is warm, dry and intact. No rash noted. Psychiatric: Mood and affect are normal. Speech and behavior are normal.    ____________________________________________   DIFFERENTIAL includes but not limited to  small bowel obstruction, large bowel obstruction, volvulus, diverticulitis, appendicitis, COPD exacerbation, pneumonia, pulmonary embolism, CHF exacerbation ____________________________________________   LABS (all labs ordered are listed, but only abnormal results are displayed)  Labs Reviewed  BASIC METABOLIC PANEL -  Abnormal; Notable for the following:       Result Value   Glucose, Bld 208 (*)    BUN 24 (*)    Creatinine, Ser 1.27 (*)    GFR calc non Af Amer 55 (*)    All other components within normal limits  CBC WITH DIFFERENTIAL/PLATELET - Abnormal; Notable for the following:    Lymphs Abs 0.9 (*)    All other components within normal limits  URINALYSIS, COMPLETE (UACMP) WITH MICROSCOPIC - Abnormal; Notable for the following:    Color, Urine YELLOW (*)    APPearance CLEAR (*)    Glucose, UA 50 (*)    Protein, ur 30 (*)    Squamous Epithelial / LPF 0-5 (*)    All other components within normal limits  HEPATIC FUNCTION PANEL  TROPONIN I  BRAIN NATRIURETIC PEPTIDE    bloodwork reviewed and interpreted by me shows slightly elevated glucose although no signs of diabetic ketoacidosis __________________________________________  EKG  ED ECG REPORT I, Darel Hong, the attending physician, personally viewed and interpreted this ECG.  Date: 07/25/2017 EKG Time:  Rate: 87 Rhythm: normal sinus rhythm QRS Axis:  normal Intervals: normal ST/T Wave abnormalities: normal Narrative Interpretation: no evidence of acute ischemia. Poor R wave progression  _______________________________________  RADIOLOGY  chest x-ray reviewed by me shows no acute disease CT abdomen and pelvis reviewed by me shows pancreatic cancer ____________________________________________   PROCEDURES  Procedure(s) performed: no  Procedures  Critical Care performed: no  Observation: no ____________________________________________   INITIAL IMPRESSION / ASSESSMENT AND PLAN / ED COURSE  Pertinent labs & imaging results that were available during my care of the patient were reviewed by me and considered in my medical decision making (see chart for details).  The patient arrives clearly short of breath and I do appreciate wheezing bilaterally. Unclear if these are primary cardiac or pulmonary wheezes.     ----------------------------------------- 1:34 PM on 07/25/2017 -----------------------------------------  The patient feels significantly improved and his work of breathing has dropped considerably. He is now moving more air. His beta natruretic peptide is negative and I believe that he is having an active COPD exacerbation. Guarding his constipation and abdominal distention CT scan is pending. Another breathing treatment as well as steroids and azithromycin. ____________________________________________   ----------------------------------------- 3:32 PM on 07/25/2017 -----------------------------------------  Unfortunately the patient's CT scan is concerning for a new diagnosis of pancreatic cancer. I discussed the case with on-call oncologist Dr. Janese Banks who will kindly see the patient in clinic this coming Friday. His shortness of breath is improved and I believe she will be stable for outpatient management with a burst of steroids and antibiotics.  FINAL CLINICAL IMPRESSION(S) / ED DIAGNOSES  Final diagnoses:    COPD exacerbation ([place])  Malignant neoplasm of pancreas, unspecified location of malignancy ([place])      NEW MEDICATIONS STARTED DURING THIS VISIT:  Discharge Medication List as of 07/25/2017  4:45 PM    START taking these medications   Details  !! albuterol (PROVENTIL HFA;VENTOLIN HFA) 108 (90 Base) MCG/ACT inhaler Inhale 2 puffs into the lungs every 6 (six) hours as needed for wheezing or shortness of breath., Starting Mon 07/25/2017, Print    azithromycin (ZITHROMAX Z-PAK) 250 MG tablet Take 2 tablets (500 mg) on  Day 1,  followed by 1 tablet (250 mg) once daily on Days 2 through 5., Print    predniSONE (DELTASONE) 10 MG tablet Take 5 tablets (50 mg total) by mouth daily., Starting Mon 07/25/2017, Until  Fri 07/29/2017, Print    Spacer/Aero Chamber Mouthpiece MISC 1 Units by Does not apply route every 4 (four) hours as needed (wheezing)., Starting Mon 07/25/2017, Print     !! - Potential duplicate medications found. Please discuss with provider.       Note:  This document was prepared using Dragon voice recognition software and may include unintentional dictation errors.     Darel Hong, MD 07/25/17 2130

## 2017-07-25 NOTE — ED Notes (Signed)
Pt requested to come off monitor to be able to ambulate in room.  Pt with steady gait; no assistance needed.  ED MD aware.

## 2017-07-25 NOTE — ED Notes (Signed)
Patient transported to CT 

## 2017-07-25 NOTE — Telephone Encounter (Signed)
Thank you.  I agree with this plan for ED care.  For acute shortness of breath not responding to nebulizer/inhaler will likely need additional workup and treatment beyond outpatient setting.

## 2017-07-26 ENCOUNTER — Telehealth: Payer: Self-pay | Admitting: Oncology

## 2017-07-29 ENCOUNTER — Other Ambulatory Visit: Payer: Self-pay

## 2017-07-29 ENCOUNTER — Inpatient Hospital Stay: Payer: Medicare Other | Admitting: Oncology

## 2017-07-29 ENCOUNTER — Telehealth: Payer: Self-pay

## 2017-07-29 NOTE — Telephone Encounter (Signed)
  Oncology Nurse Navigator Documentation Voicemail left with Nathaniel Little to return call to arrange for EUS, Nov 1 at [facility] per Dr. Janese Banks. Navigator Location: [facility] (07/29/17 0900)   )Navigator Encounter Type: Telephone (07/29/17 0900) Telephone: Outgoing Call (07/29/17 0900)                       Barriers/Navigation Needs: Coordination of Care (07/29/17 0900)   Interventions: Coordination of Care (07/29/17 0900)   Coordination of Care: EUS (07/29/17 0900)                  Time Spent with Patient: 15 (07/29/17 0900)

## 2017-07-29 NOTE — Telephone Encounter (Signed)
  Oncology Nurse Navigator Documentation Nathaniel Little returned call. He reports he does not drive and only has one family member in town. He has a neighbor that assists him,and they must be at work by 3p. After talking further he will ask his neighbor if they can bring him for EUS and then his daughter can bring him home. I went over instructions with him and mailed a copy to his home address. I will call him Monday and review instructions and verify his transportation. Reports only blood thinner is aspirin. Denies diabetes. Reports his breathing is much Improved since ED visit. He is still having loose stools with some blood noted on toilet paper. INSTRUCTIONS FOR ENDOSCOPIC ULTRASOUND -Your procedure has been scheduled for November 1st with Dr. Francella Solian at [facility]. -The hospital may contact you to pre-register over the phone.  -To get your scheduled arrival time, please call the Endoscopy unit at  [phone] between 1-3 p.m. on:  October 31st   -ON THE DAY OF YOU PROCEDURE:   1. If you are scheduled for a morning procedure, nothing to drink after midnight  -If you are scheduled for an afternoon procedure, you may have clear liquids until 5 hours prior  to the procedure but no carbonated drinks or broth  2. NO FOOD THE DAY OF YOUR PROCEDURE  3. You may take your heart, seizure, blood pressure, Parkinson's or breathing medications at  6am with just enough water to get your pills down  4. Do not take any oral Diabetic medications the morning of your procedure.  5. If you are a diabetic and are using insulin, please notify your prescribing physician of this  procedure as your dose may need to be altered related to not being able to eat or drink.   5. Do not take vitamins, iron, or fish oil for 5 days before your procedure     -On the day of your procedure, come to the [facility] Admitting/Registration desk (First desk on the right) at the scheduled arrival time. You MUST  have someone drive you home from your procedure. You must have a responsible adult with a valid driver's license who is on site throughout your entire procedure and who can stay with you for several hours after your procedure. You may not go home alone in a taxi, shuttle [place] or bus, as the drivers will not be responsible for you.  --If you have any questions please call me at the above contact  Navigator Location: [facility] (07/29/17 1000)   )Navigator Encounter Type: Telephone (07/29/17 1000) Telephone: Lahoma Crocker Call (07/29/17 1000)                       Barriers/Navigation Needs: Coordination of Care;Education (07/29/17 1000)   Interventions: Coordination of Care (07/29/17 1000)   Coordination of Care: EUS (07/29/17 1000)                  Time Spent with Patient: 30 (07/29/17 1000)

## 2017-08-01 ENCOUNTER — Telehealth: Payer: Self-pay

## 2017-08-01 NOTE — Telephone Encounter (Signed)
  Oncology Nurse Navigator Documentation Dr. Janese Banks would like to see Nathaniel Little prior to EUS. He only has transportation to be dropped off at [facility] at 0730 in the am. His EUS is at 1300 and he will need to see Dr. Janese Banks just prior to this. I have made arrangements for Lucianne Lei transportation at 0900 to bring him to the [facility] We will then transport to EUS. His daughter will meet him in Endo for discharge instructions and transportation home. Instructed to bring picture ID/insurance cards and CPAP with him. Navigator Location: [facility] (08/01/17 1100)   )Navigator Encounter Type: Telephone (08/01/17 1100) Telephone: Outgoing Call (08/01/17 1100)                       Barriers/Navigation Needs: Coordination of Care (08/01/17 1100)   Interventions: Coordination of Care (08/01/17 1100)   Coordination of Care: EUS (08/01/17 1100)                  Time Spent with Patient: 30 (08/01/17 1100)

## 2017-08-03 ENCOUNTER — Encounter: Payer: Self-pay | Admitting: *Deleted

## 2017-08-04 ENCOUNTER — Inpatient Hospital Stay: Payer: Medicare Other | Attending: Oncology | Admitting: Oncology

## 2017-08-04 ENCOUNTER — Telehealth: Payer: Self-pay | Admitting: Oncology

## 2017-08-04 ENCOUNTER — Inpatient Hospital Stay: Payer: Medicare Other

## 2017-08-04 ENCOUNTER — Inpatient Hospital Stay: Payer: Medicare Other | Admitting: Oncology

## 2017-08-04 ENCOUNTER — Ambulatory Visit
Admission: RE | Admit: 2017-08-04 | Discharge: 2017-08-04 | Disposition: A | Discharge status: Home / Self Care | Payer: Medicare Other | Source: Ambulatory Visit | Attending: Gastroenterology | Admitting: Gastroenterology

## 2017-08-04 ENCOUNTER — Encounter
Admission: RE | Disposition: A | Discharge status: Home / Self Care | Payer: Self-pay | Source: Ambulatory Visit | Attending: Gastroenterology

## 2017-08-04 ENCOUNTER — Ambulatory Visit: Payer: Medicare Other | Admitting: Anesthesiology

## 2017-08-04 ENCOUNTER — Encounter: Payer: Self-pay | Admitting: Oncology

## 2017-08-04 ENCOUNTER — Encounter: Payer: Self-pay | Admitting: *Deleted

## 2017-08-04 ENCOUNTER — Other Ambulatory Visit: Payer: Self-pay

## 2017-08-04 VITALS — BP 118/70 | HR 60 | Temp 97.9°F | Resp 18 | Ht 69.0 in | Wt 264.0 lb

## 2017-08-04 DIAGNOSIS — K861 Other chronic pancreatitis: Secondary | ICD-10-CM | POA: Insufficient documentation

## 2017-08-04 DIAGNOSIS — Z79899 Other long term (current) drug therapy: Secondary | ICD-10-CM | POA: Insufficient documentation

## 2017-08-04 DIAGNOSIS — R9389 Abnormal findings on diagnostic imaging of other specified body structures: Secondary | ICD-10-CM | POA: Diagnosis not present

## 2017-08-04 DIAGNOSIS — K760 Fatty (change of) liver, not elsewhere classified: Secondary | ICD-10-CM | POA: Insufficient documentation

## 2017-08-04 DIAGNOSIS — K869 Disease of pancreas, unspecified: Secondary | ICD-10-CM | POA: Diagnosis not present

## 2017-08-04 DIAGNOSIS — D136 Benign neoplasm of pancreas: Secondary | ICD-10-CM | POA: Insufficient documentation

## 2017-08-04 DIAGNOSIS — J449 Chronic obstructive pulmonary disease, unspecified: Secondary | ICD-10-CM | POA: Insufficient documentation

## 2017-08-04 DIAGNOSIS — G4733 Obstructive sleep apnea (adult) (pediatric): Secondary | ICD-10-CM | POA: Diagnosis not present

## 2017-08-04 DIAGNOSIS — Z87891 Personal history of nicotine dependence: Secondary | ICD-10-CM | POA: Insufficient documentation

## 2017-08-04 DIAGNOSIS — G4730 Sleep apnea, unspecified: Secondary | ICD-10-CM | POA: Diagnosis not present

## 2017-08-04 DIAGNOSIS — Z7982 Long term (current) use of aspirin: Secondary | ICD-10-CM | POA: Diagnosis not present

## 2017-08-04 DIAGNOSIS — I10 Essential (primary) hypertension: Secondary | ICD-10-CM | POA: Insufficient documentation

## 2017-08-04 DIAGNOSIS — K5900 Constipation, unspecified: Secondary | ICD-10-CM | POA: Diagnosis not present

## 2017-08-04 DIAGNOSIS — Z5309 Procedure and treatment not carried out because of other contraindication: Secondary | ICD-10-CM | POA: Diagnosis not present

## 2017-08-04 DIAGNOSIS — I700 Atherosclerosis of aorta: Secondary | ICD-10-CM | POA: Insufficient documentation

## 2017-08-04 DIAGNOSIS — K219 Gastro-esophageal reflux disease without esophagitis: Secondary | ICD-10-CM | POA: Insufficient documentation

## 2017-08-04 DIAGNOSIS — R011 Cardiac murmur, unspecified: Secondary | ICD-10-CM

## 2017-08-04 DIAGNOSIS — F329 Major depressive disorder, single episode, unspecified: Secondary | ICD-10-CM | POA: Insufficient documentation

## 2017-08-04 DIAGNOSIS — Z8546 Personal history of malignant neoplasm of prostate: Secondary | ICD-10-CM | POA: Insufficient documentation

## 2017-08-04 DIAGNOSIS — M1990 Unspecified osteoarthritis, unspecified site: Secondary | ICD-10-CM | POA: Insufficient documentation

## 2017-08-04 DIAGNOSIS — K8689 Other specified diseases of pancreas: Secondary | ICD-10-CM

## 2017-08-04 LAB — CBC WITH DIFFERENTIAL/PLATELET
Basophils Absolute: 0.1 10*3/uL (ref 0–0.1)
Basophils Relative: 1 %
EOS PCT: 0 %
Eosinophils Absolute: 0 10*3/uL (ref 0–0.7)
HCT: 45.1 % (ref 40.0–52.0)
Hemoglobin: 15.4 g/dL (ref 13.0–18.0)
LYMPHS ABS: 1.6 10*3/uL (ref 1.0–3.6)
Lymphocytes Relative: 16 %
MCH: 31.2 pg (ref 26.0–34.0)
MCHC: 34.2 g/dL (ref 32.0–36.0)
MCV: 91.4 fL (ref 80.0–100.0)
MONO ABS: 0.8 10*3/uL (ref 0.2–1.0)
MONOS PCT: 8 %
NEUTROS PCT: 75 %
Neutro Abs: 7.4 10*3/uL — ABNORMAL HIGH (ref 1.4–6.5)
PLATELETS: 218 10*3/uL (ref 150–440)
RBC: 4.94 MIL/uL (ref 4.40–5.90)
RDW: 14.5 % (ref 11.5–14.5)
WBC: 9.9 10*3/uL (ref 3.8–10.6)

## 2017-08-04 LAB — COMPREHENSIVE METABOLIC PANEL
ALT: 45 U/L (ref 17–63)
AST: 34 U/L (ref 15–41)
Albumin: 4.1 g/dL (ref 3.5–5.0)
Alkaline Phosphatase: 76 U/L (ref 38–126)
Anion gap: 9 (ref 5–15)
BILIRUBIN TOTAL: 0.7 mg/dL (ref 0.3–1.2)
BUN: 23 mg/dL — ABNORMAL HIGH (ref 6–20)
CHLORIDE: 101 mmol/L (ref 101–111)
CO2: 25 mmol/L (ref 22–32)
CREATININE: 1.09 mg/dL (ref 0.61–1.24)
Calcium: 9 mg/dL (ref 8.9–10.3)
Glucose, Bld: 114 mg/dL — ABNORMAL HIGH (ref 65–99)
POTASSIUM: 3.3 mmol/L — AB (ref 3.5–5.1)
Sodium: 135 mmol/L (ref 135–145)
TOTAL PROTEIN: 7.2 g/dL (ref 6.5–8.1)

## 2017-08-04 SURGERY — ULTRASOUND, UPPER GI TRACT, ENDOSCOPIC
Anesthesia: Choice

## 2017-08-04 MED ORDER — PROPOFOL 500 MG/50ML IV EMUL
INTRAVENOUS | Status: AC
  Filled 2017-08-04: qty 50

## 2017-08-04 MED ORDER — LIDOCAINE HCL (PF) 1 % IJ SOLN
INTRAMUSCULAR | Status: AC
  Filled 2017-08-04: qty 2

## 2017-08-04 MED ORDER — MIDAZOLAM HCL 2 MG/2ML IJ SOLN
INTRAMUSCULAR | Status: AC
  Filled 2017-08-04: qty 2

## 2017-08-04 MED ORDER — IPRATROPIUM-ALBUTEROL 0.5-2.5 (3) MG/3ML IN SOLN: 3.0000 mL | Freq: Four times a day (QID) | RESPIRATORY_TRACT | Status: DC

## 2017-08-04 MED ORDER — LIDOCAINE HCL (PF) 1 % IJ SOLN: 2.0000 mL | Freq: Once | INTRAMUSCULAR | Status: DC

## 2017-08-04 MED ORDER — IPRATROPIUM-ALBUTEROL 0.5-2.5 (3) MG/3ML IN SOLN
RESPIRATORY_TRACT | Status: AC
  Filled 2017-08-04: qty 3

## 2017-08-04 MED ORDER — SODIUM CHLORIDE 0.9 % IV SOLN: INTRAVENOUS | Status: DC

## 2017-08-04 NOTE — H&P (Signed)
Outpatient short stay form Pre-procedure 08/04/2017 12:50 PM Nathaniel Little,  Nathaniel Hines, MD  Primary Physician: [name], NP   Reason for visit:  No chief complaint on file.   No diagnosis found.   History of present illness:  Incidental finding of cystic pancreatic lesion in the setting of background radiographic appearance of chronic pancreatitis. Never been told he had pancreatitis. Prior significant alcohol intake.  No sig pain now.    Current Facility-Administered Medications:  .  0.9 %  sodium chloride infusion, , Intravenous, Continuous, Jola Schmidt, MD  Prescriptions Prior to Admission  Medication Sig Dispense Refill Last Dose  . albuterol (PROVENTIL HFA;VENTOLIN HFA) 108 (90 Base) MCG/ACT inhaler Inhale 2 puffs into the lungs every 6 (six) hours as needed for wheezing or shortness of breath. 1 Inhaler 0 08/03/2017 at Unknown time  . aspirin 81 MG tablet Take 81 mg by mouth daily.   Past Week at Unknown time  . buPROPion (WELLBUTRIN SR) 100 MG 12 hr tablet Take 1 tablet (100 mg total) by mouth 2 (two) times daily. 60 tablet 5 08/03/2017 at Unknown time  . escitalopram (LEXAPRO) 10 MG tablet Take 10 mg by mouth daily. Reported on 04/14/2016   08/03/2017 at Unknown time  . losartan (COZAAR) 100 MG tablet Take 100 mg by mouth daily.   08/03/2017 at Unknown time  . omeprazole (PRILOSEC) 40 MG capsule Take 40 mg by mouth 3 times/day as needed-between meals & bedtime.   08/03/2017 at Unknown time  . polyethylene glycol powder (GLYCOLAX/MIRALAX) powder Take 17 g by mouth 2 (two) times daily.   08/03/2017 at Unknown time  . sildenafil (REVATIO) 20 MG tablet Take 1 to 5 tabs PO daily prn 30 tablet 3 08/03/2017 at Unknown time  . tiotropium (SPIRIVA HANDIHALER) 18 MCG inhalation capsule Place 1 capsule (18 mcg total) into inhaler and inhale daily. 30 capsule 5 08/03/2017 at Unknown time  . ipratropium-albuterol (DUONEB) 0.5-2.5 (3) MG/3ML SOLN Take 3 mLs by nebulization every 6 (six)  hours as needed (wheezing, shortness of breath). 360 mL 3 Taking  . Spacer/Aero Chamber Mouthpiece MISC 1 Units by Does not apply route every 4 (four) hours as needed (wheezing). 1 each 0 Taking    Allergies  Allergen Reactions  . Codeine Itching    Past Medical History:  Diagnosis Date  . Arthritis   . Cancer ([place]) 03/2013   Prostate  . COPD (chronic obstructive pulmonary disease) ([place])   . Depression   . Heart disease   . Heart murmur   . Hypertension   . Lymphedema   . Sleep apnea with use of continuous positive airway pressure (CPAP)     Review of systems:      Physical Exam    Heart and lungs:  SEM noted.  Lungs clear    HEENT: grossly normal         Pertinant exam for procedure: protruberant abd. Soft but tender in epigastrium.    Planned proceedures: EUS    Zada Girt MD Gastroenterology 08/04/2017  12:50 PM

## 2017-08-04 NOTE — Progress Notes (Signed)
  Oncology Nurse Navigator Documentation Met with [name] before and during consult with Dr. Janese Banks. Following consult he was transported to registration in a wheelchair then to Endoscopy for EUS. Notified Endo to make anesthesia aware that he has a heart murmur and he forgot to bring his cpap. Dr. Francella Solian can also discuss when he arrives in Endo. Navigator Location: [facility] (08/04/17 1300)   )Navigator Encounter Type: Initial MedOnc (08/04/17 1300)                     Patient Visit Type: MedOnc;Initial (08/04/17 1300) Treatment Phase: Abnormal Scans (08/04/17 1300)                            Time Spent with Patient: 90 (08/04/17 1300)

## 2017-08-04 NOTE — Progress Notes (Signed)
Patient here for evaluation of a pancreatic mass. He is scheduled for an EUS today after his appointment here. He denies having any pain, but states that he is having some "gas bubbles" in his upper abdomen today.

## 2017-08-04 NOTE — Care Management (Signed)
case cancelled for pulmonary and cardiovascular reasons.

## 2017-08-04 NOTE — Telephone Encounter (Signed)
Nathaniel Little is working on Chiropodist for patient, as per patient has no way of getting to and from appointments.  MD/MRI Results, per 08/04/17 los. Per Nathaniel Little, she will notify patients of all appointments.

## 2017-08-04 NOTE — Telephone Encounter (Signed)
Rschd to earlier time of 11:30 a.m  As New Oncology Patient, per Kristi/Sherry/Dr Janese Banks. Schd opened, per schd msg/ request. Pancreatic Mass/New Patient. Previous appt was scheduled as est patient for 30 mins, per Kristi.  MF

## 2017-08-08 ENCOUNTER — Encounter: Payer: Self-pay | Admitting: Oncology

## 2017-08-08 NOTE — Progress Notes (Signed)
Hematology/Oncology Consult note [facility] Telephone:([phone] [phone]  Patient Care Team: [name], NP as PCP - General Clent Jacks, RN as Registered Nurse   Name of the patient: Nathaniel Little  [id number]  Aug 13, 1946    Reason for referral- abnormal pancreatic findings on CT abdomen   Referring physician- ER- Dr. Mable Paris  Date of visit: 08/08/17   History of presenting illness-patient is a 71 year old male who presented to the ER with some symptoms of cough and shortness of breath as well as constipation which was ongoing for 2 weeks.    He underwent CT abdomen which showed abnormal appearance of the pancreas with several large calculi along the dorsal pancreatic duct, apparent prominent dilated segment of the dorsal pancreatic duct in the pancreatic body and a cystic mass along the upper margin of the pancreatic body measuring up to 3.6 cm in long axis with suspected internal septations.  This most likely is a cystic serous neoplasm but is not entirely specific.  Pancreatic protocol MRI is recommended.  Diffuse hepatic steatosis.  Degenerative disc disease.  Faint hypodensity in the right lower lobe probably due to motion artifact.  Not considered specific for pulmonary embolus.  Patient reports that his cough and shortness of breath has now improved.  He lives alone and is independent of his ADLs but does have problems with transportation for his medical visits.  He has a strong history of alcohol intake between the age of 89 into his 39s when he ultimately stopped drinking.  Denies any history of gallbladder issues or history of pancreatitis in the past.  Currently denies any abdominal pain, fevers, chills, unintentional weight loss.  He also has a history of cardiac murmur which he says has been ongoing for a long time but he has not seen a cardiologist yet  ECOG PS- 1  Pain scale- 0   Review of systems- Review of  Systems  Constitutional: Negative for chills, fever, malaise/fatigue and weight loss.  HENT: Negative for congestion, ear discharge and nosebleeds.   Eyes: Negative for blurred vision.  Respiratory: Negative for cough, hemoptysis, sputum production, shortness of breath and wheezing.   Cardiovascular: Negative for chest pain, palpitations, orthopnea and claudication.  Gastrointestinal: Negative for abdominal pain, blood in stool, constipation, diarrhea, heartburn, melena, nausea and vomiting.  Genitourinary: Negative for dysuria, flank pain, frequency, hematuria and urgency.  Musculoskeletal: Negative for back pain, joint pain and myalgias.  Skin: Negative for rash.  Neurological: Negative for dizziness, tingling, focal weakness, seizures, weakness and headaches.  Endo/Heme/Allergies: Does not bruise/bleed easily.  Psychiatric/Behavioral: Negative for depression and suicidal ideas. The patient does not have insomnia.     Allergies  Allergen Reactions  . Codeine Itching    Patient Active Problem List   Diagnosis Date Noted  . Aortic atherosclerosis ([place]) 07/25/2017  . BMI 38.0-38.9,adult 02/09/2017  . Arthritis pain of hip 01/07/2017  . COPD (chronic obstructive pulmonary disease) ([place]) 01/07/2017  . GERD (gastroesophageal reflux disease) 01/07/2017  . Inability to attain erection 01/07/2017  . Tobacco smoker within last 12 months 01/07/2017  . Hypertension 01/07/2017  . Heart murmur, systolic 53/97/6734  . Depression 01/06/2017  . Sleep apnea with use of continuous positive airway pressure (CPAP) 01/06/2017  . Personal history of prostate cancer 03/04/2013     Past Medical History:  Diagnosis Date  . Arthritis   . Cancer ([place]) 03/2013   Prostate  . COPD (chronic obstructive pulmonary disease) ([place])   . Depression   .  Heart disease   . Heart murmur   . Hypertension   . Lymphedema   . Sleep apnea with use of continuous positive airway pressure (CPAP)      Past  Surgical History:  Procedure Laterality Date  . ADENOIDECTOMY    . APPENDECTOMY    . TONSILLECTOMY      Social History   Socioeconomic History  . Marital status: Widowed    Spouse name: Not on file  . Number of children: Not on file  . Years of education: Not on file  . Highest education level: Not on file  Social Needs  . Financial resource strain: Not on file  . Food insecurity - worry: Not on file  . Food insecurity - inability: Not on file  . Transportation needs - medical: Not on file  . Transportation needs - non-medical: Not on file  Occupational History  . Not on file  Tobacco Use  . Smoking status: Former Smoker    Packs/day: 0.50    Years: 68.00    Pack years: 34.00    Types: Cigarettes    Last attempt to quit: 04/01/2017    Years since quitting: 0.3  . Smokeless tobacco: Former Systems developer    Types: Chew, Snuff    Quit date: 05/16/2016  Substance and Sexual Activity  . Alcohol use: Yes    Comment: occ  . Drug use: No  . Sexual activity: Not Currently  Other Topics Concern  . Not on file  Social History Narrative  . Not on file     Family History  Problem Relation Age of Onset  . Breast cancer Mother   . Cancer Father        Black Lung  . Cerebral palsy Brother   . Tuberculosis Paternal Uncle   . Prostate cancer Neg Hx   . Kidney cancer Neg Hx   . Bladder Cancer Neg Hx      Current Outpatient Medications:  .  albuterol (PROVENTIL HFA;VENTOLIN HFA) 108 (90 Base) MCG/ACT inhaler, Inhale 2 puffs into the lungs every 6 (six) hours as needed for wheezing or shortness of breath., Disp: 1 Inhaler, Rfl: 0 .  aspirin 81 MG tablet, Take 81 mg by mouth daily., Disp: , Rfl:  .  buPROPion (WELLBUTRIN SR) 100 MG 12 hr tablet, Take 1 tablet (100 mg total) by mouth 2 (two) times daily., Disp: 60 tablet, Rfl: 5 .  escitalopram (LEXAPRO) 10 MG tablet, Take 10 mg by mouth daily. Reported on 04/14/2016, Disp: , Rfl:  .  ipratropium-albuterol (DUONEB) 0.5-2.5 (3) MG/3ML  SOLN, Take 3 mLs by nebulization every 6 (six) hours as needed (wheezing, shortness of breath)., Disp: 360 mL, Rfl: 3 .  losartan (COZAAR) 100 MG tablet, Take 100 mg by mouth daily., Disp: , Rfl:  .  omeprazole (PRILOSEC) 40 MG capsule, Take 40 mg by mouth 3 times/day as needed-between meals & bedtime., Disp: , Rfl:  .  Spacer/Aero Chamber Mouthpiece MISC, 1 Units by Does not apply route every 4 (four) hours as needed (wheezing)., Disp: 1 each, Rfl: 0 .  tiotropium (SPIRIVA HANDIHALER) 18 MCG inhalation capsule, Place 1 capsule (18 mcg total) into inhaler and inhale daily., Disp: 30 capsule, Rfl: 5 .  polyethylene glycol powder (GLYCOLAX/MIRALAX) powder, Take 17 g by mouth 2 (two) times daily., Disp: , Rfl:  .  sildenafil (REVATIO) 20 MG tablet, Take 1 to 5 tabs PO daily prn, Disp: 30 tablet, Rfl: 3   Physical exam:  Vitals:  08/04/17 1058  BP: 118/70  Pulse: 60  Resp: 18  Temp: 97.9 F (36.6 C)  TempSrc: Tympanic  Weight: 264 lb (119.7 kg)  Height: 5\' 9"  (1.753 m)   Physical Exam  Constitutional: He is oriented to person, place, and time.  He has central obesity.  Does not appear to be in any acute distress and ambulates with a cane  HENT:  Head: Normocephalic and atraumatic.  Eyes: EOM are normal. Pupils are equal, round, and reactive to light.  Neck: Normal range of motion.  Cardiovascular: Normal rate and regular rhythm.  Systolic murmur positive  Pulmonary/Chest: Effort normal and breath sounds normal.  Abdominal: Soft. Bowel sounds are normal.  Neurological: He is alert and oriented to person, place, and time.  Skin: Skin is warm and dry.       CMP Latest Ref Rng & Units 08/04/2017  Glucose 65 - 99 mg/dL 114(H)  BUN 6 - 20 mg/dL 23(H)  Creatinine 0.61 - 1.24 mg/dL 1.09  Sodium 135 - 145 mmol/L 135  Potassium 3.5 - 5.1 mmol/L 3.3(L)  Chloride 101 - 111 mmol/L 101  CO2 22 - 32 mmol/L 25  Calcium 8.9 - 10.3 mg/dL 9.0  Total Protein 6.5 - 8.1 g/dL 7.2  Total  Bilirubin 0.3 - 1.2 mg/dL 0.7  Alkaline Phos 38 - 126 U/L 76  AST 15 - 41 U/L 34  ALT 17 - 63 U/L 45   CBC Latest Ref Rng & Units 08/04/2017  WBC 3.8 - 10.6 K/uL 9.9  Hemoglobin 13.0 - 18.0 g/dL 15.4  Hematocrit 40.0 - 52.0 % 45.1  Platelets 150 - 440 K/uL 218    No images are attached to the encounter.  Ct Abdomen Pelvis W Contrast  Result Date: 07/25/2017 CLINICAL DATA:  Shortness of breath and cough for 2 weeks. Constipation. EXAM: CT ABDOMEN AND PELVIS WITH CONTRAST TECHNIQUE: Multidetector CT imaging of the abdomen and pelvis was performed using the standard protocol following bolus administration of intravenous contrast. CONTRAST:  137mL ISOVUE-300 IOPAMIDOL (ISOVUE-300) INJECTION 61% COMPARISON:  Abdomen radiographs from 07/12/2016 FINDINGS: Lower chest: Faint hypodensity in the right lower lobe pulmonary artery on images 1-2 of series 2 is probably most attributable to the motion artifact present in the right lung on these images. Subsegmental atelectasis or scarring in the lingula. Coronary and descending thoracic aortic atherosclerotic calcification. Hepatobiliary: Diffuse hepatic steatosis with slight sparing along the gallbladder fossa. No biliary dilatation. Gallbladder unremarkable. Pancreas: Dilated dorsal pancreatic duct especially along a 4.5 cm segment of the duct in the pancreatic body where the dorsal pancreatic duct measures up to 12.3 cm in diameter. Cyst dorsal to this there is a fairly low density 2.0 by 3.3 by 3.6 cm mass. Irregular calcification measuring 1.7 by 1.0 by 0.9 cm in the dorsal pancreatic duct in the pancreatic head. Separate calcification along the dorsal pancreatic duct in the pancreatic tail measuring 1.3 by 0.7 by 0.7 cm. Indistinct hypodensity in the pancreatic head centrally possibly from dilated duct but technically nonspecific. Spleen: Unremarkable Adrenals/Urinary Tract: There is contrast medium in the collecting systems due to an initial injection with  IV injection failure. Multiple small indistinct hypodense lesions in both kidneys are technically too small to characterize, although statistically likely to be benign. One lesion is Complex on image 32/2, suggesting a chronic complex cyst but potentially meriting surveillance. Sensitivity for nonobstructive renal calculi is low due to the pre-existing contrast medium along the urothelium. Urinary bladder unremarkable. Stomach/Bowel: Tortuosity of the descending duodenum  observed. Otherwise unremarkable Vascular/Lymphatic: Aortoiliac atherosclerotic vascular disease. No pathologic adenopathy. Reproductive: Fiducials are present along the margins of the prostate gland. Other: No supplemental non-categorized findings. Musculoskeletal: Fatty left spermatic cord. Moderate degenerative arthropathy of both hips. Considerable lumbar spondylosis with grade 1 anterolisthesis at L4-5 at L5-S1 and impingement at all levels between L1 and S1. The most notable central narrowing of the thecal sac is at the L3-4 and L4-5 levels. IMPRESSION: 1. Notably abnormal appearance of the pancreas with several large calculi along the dorsal pancreatic duct ; a prominently dilated segment of the dorsal pancreatic duct in the pancreatic body; and a cystic mass along the upper margin of the pancreatic body measuring up to 3.6 cm in long axis with suspected internal septations. This is most likely a cystic serous neoplasm but is not entirely specific. Given the pancreatic abnormalities and cystic mass, pancreatic protocol MRI with and without contrast is recommended for further workup. 2. Diffuse hepatic steatosis. 3.  Aortic Atherosclerosis (ICD10-I70.0).  Coronary atherosclerosis. 4. Considerable lumbar spondylosis and degenerative disc disease causing impingement at all lumbar levels. 5. Fiducials along the margins of the prostate gland suggesting prior treatment for prostate cancer, correlate with patient history. No adenopathy or compelling  findings of active malignancy. 6. There is faint hypodensity in the right lower lobe pulmonary artery on images 1-2 of series 2. This is probably due to motion artifact in the right lung. The appearance is not considered specific for pulmonary embolus given the degree of motion artifact, however if there are risk factors or concerns for pulmonary embolus based on patient history then dedicated imaging workup for pulmonary embolus may be warranted. Electronically Signed   By: Van Clines M.D.   On: 07/25/2017 14:49   Dg Chest Port 1 View  Result Date: 07/25/2017 CLINICAL DATA:  Increasing shortness of breath and cough. EXAM: PORTABLE CHEST 1 VIEW COMPARISON:  Chest x-rays dated July 12, 2016 and June 14, 2016. FINDINGS: The cardiomediastinal silhouette is normal in size. Normal pulmonary vascularity. Bibasilar atelectasis No focal consolidation, pleural effusion, or pneumothorax. No acute osseous abnormality. IMPRESSION: Bibasilar atelectasis.  No active cardiopulmonary disease. Electronically Signed   By: Titus Dubin M.D.   On: 07/25/2017 12:42    Assessment and plan- Patient is a 71 y.o. male referred for incidentally discovered abnormal CT findings with pancreatic duct dilatation and possible cystic neoplasm  I have personally reviewed the patient's CT scan images and discussed his findings at the tumor board with radiology as well as Dr. Francella Solian who was present at the tumor board.  Pancreatic findings are more suggestive of chronic pancreatitis and calcifications.IPMN that cannot be ruled out at this time  Plan is to proceed with a diagnostic EUS by Dr. Francella Solian.  The patient however has a history of obstructive sleep apnea as well as a cardiac murmur and I will await further recommendations by Dr. Francella Solian if he would need any other prior workup before the EUS.  If patient needs cardiology clearance with regards to his heart murmur I will make arrangements for the same.  I will also  get MRI of the abdomen with and without contrast pancreatic mass protocol to evaluate the pancreatic findings.  I will check a baseline CBC and CMP today and I will see the patient back after his MRI and/or EUS   Thank you for this kind referral and the opportunity to participate in the care of this  Patient   Visit Diagnosis 1. Pancreatic mass  2. Abnormal CT scan     Dr. Randa Evens, MD, MPH [facility] at [facility] Pager- [phone] 08/08/2017 8:07 AM

## 2017-08-11 ENCOUNTER — Ambulatory Visit: Payer: Medicare Other | Admitting: Oncology

## 2017-08-12 ENCOUNTER — Telehealth: Payer: Self-pay

## 2017-08-12 ENCOUNTER — Ambulatory Visit
Admission: RE | Admit: 2017-08-12 | Discharge: 2017-08-12 | Disposition: A | Discharge status: Home / Self Care | Payer: Medicare Other | Source: Ambulatory Visit | Attending: Oncology | Admitting: Oncology

## 2017-08-12 ENCOUNTER — Other Ambulatory Visit: Payer: Self-pay | Admitting: *Deleted

## 2017-08-12 ENCOUNTER — Ambulatory Visit: Payer: Medicare Other | Admitting: Oncology

## 2017-08-12 DIAGNOSIS — R9389 Abnormal findings on diagnostic imaging of other specified body structures: Secondary | ICD-10-CM

## 2017-08-12 DIAGNOSIS — K8689 Other specified diseases of pancreas: Secondary | ICD-10-CM

## 2017-08-12 MED ORDER — LORAZEPAM 0.5 MG PO TABS: 0.5000 mg | ORAL_TABLET | Freq: Once | ORAL | 0 refills | Status: AC

## 2017-08-12 NOTE — Telephone Encounter (Signed)
  Oncology Nurse Navigator Documentation Mr. Dowis was unable to have MRI today after he alerted staff that he felt he needed oxygen to have scan done. He uses CPAP at night at home. New orders enter to reflect use of oxygen during scan. He is also requesting a medication due to fear of panicing in MRI to help him relax. Reports only allergy is to codeine. He uses [facility]. Once MRI is rescheduled he is to request transportation from [place]. If they cannot provide transportation we can request our transportation services. Navigator Location: [facility] (08/12/17 1200)   )                                                     Time Spent with Patient: 15 (08/12/17 1200)

## 2017-08-12 NOTE — Telephone Encounter (Signed)
Do I need to prescribe ativan prior to procedure?

## 2017-08-12 NOTE — Telephone Encounter (Signed)
Yes please. He is requesting. We recommend a low dose since he is using public transportation.

## 2017-08-12 NOTE — Telephone Encounter (Addendum)
  Oncology Nurse Navigator Documentation Contacted and notified Nathaniel Little about his new MRI appointment for 11/15 at 1315 with arrival time of 1245p. This appointment is in [place] location. Address given with read back performed. He has attempted to contact [facility] for transportation and they are closed today due to the holiday. He will call them Monday and notify [facility] if they are able to transport him. He had been educated on ativan 0.5mg  to take one hour before his MRI appointment. He was instructed to take his cpap to MRI as a precaution. He was also instructed not to eat or drink anything 4 hours prior to his MRI. Navigator Location: [facility] (08/12/17 1400)   )Navigator Encounter Type: Telephone (08/12/17 1400) Telephone: [facility] Call;Appt Confirmation/Clarification (08/12/17 1400)                                                  Time Spent with Patient: 15 (08/12/17 1400)

## 2017-08-15 NOTE — Telephone Encounter (Signed)
I did it already and know you saw it and told him about taking it 1 hour before procedure, thanks

## 2017-08-16 ENCOUNTER — Ambulatory Visit: Payer: Medicare Other | Admitting: Oncology

## 2017-08-18 ENCOUNTER — Ambulatory Visit
Admission: RE | Admit: 2017-08-18 | Discharge: 2017-08-18 | Disposition: A | Discharge status: Home / Self Care | Payer: Medicare Other | Source: Ambulatory Visit | Attending: Oncology | Admitting: Oncology

## 2017-08-18 DIAGNOSIS — K869 Disease of pancreas, unspecified: Secondary | ICD-10-CM | POA: Diagnosis not present

## 2017-08-18 DIAGNOSIS — K8689 Other specified diseases of pancreas: Secondary | ICD-10-CM

## 2017-08-18 DIAGNOSIS — K862 Cyst of pancreas: Secondary | ICD-10-CM | POA: Diagnosis not present

## 2017-08-18 MED ORDER — GADOBENATE DIMEGLUMINE 529 MG/ML IV SOLN
20.0000 mL | Freq: Once | INTRAVENOUS | Status: AC | PRN
  Administered 2017-08-18: 20 mL via INTRAVENOUS

## 2017-08-20 DIAGNOSIS — K8689 Other specified diseases of pancreas: Secondary | ICD-10-CM | POA: Insufficient documentation

## 2017-08-20 NOTE — Progress Notes (Signed)
Cardiology Office Note  Date:  08/22/2017   ID:  Nathaniel Little, DOB [birth year], MRN [id number]  PCP:  [name], NP   Chief Complaint  Patient presents with  . other    New patient. Per [facility] Murmur per [facility]. Patient states he needs an ECHO before the 29TH of november per cancer center. Meds reviewed verbally with patient.     HPI:  Nathaniel Little is a 71 y.o. male  Smoker, COPD Prior significant alcohol intake.  OSA, on CPAP HTN Murmur Prostate cancer 07/25/2017 emergency department via EMS with 1 day of progressive worsening shortness of breath.   increasing productive cough and shortness of breath  Constipated CT ABD: Pancreatic findings :chronic pancreatitis and calcifications, possible pancreatic mass Who presents by referral from Dr. Janese Banks, oncology for evaluation of his murmur  Reports having shortness of breath symptoms, particularly if he bends over, tries to tie his shoes  Having fleeting episodes of chest pain, "only last for short periods of time" Has not told anyone  Concerned of pancreatic findings Scheduled to meet oncology later this month Was told he had loud murmur  Continues to smoke, down from 4 packs/day Having pain in his legs, sometimes when he sleeps, sometimes in the day.  Reports pain is severe  Currently not on cholesterol medication Does not check his blood pressure at home  EKG personally reviewed by myself on todays visit Shows normal sinus rhythm rate 87 bpm old inferior MI   PMH:   has a past medical history of Arthritis, Cancer ([place]) (03/2013), COPD (chronic obstructive pulmonary disease) ([place]), Depression, Heart disease, Heart murmur, Hypertension, Lymphedema, and Sleep apnea with use of continuous positive airway pressure (CPAP).  PSH:    Past Surgical History:  Procedure Laterality Date  . ADENOIDECTOMY    . APPENDECTOMY    . TONSILLECTOMY      Current Outpatient Medications   Medication Sig Dispense Refill  . albuterol (PROVENTIL HFA;VENTOLIN HFA) 108 (90 Base) MCG/ACT inhaler Inhale 2 puffs into the lungs every 6 (six) hours as needed for wheezing or shortness of breath. 1 Inhaler 0  . aspirin 81 MG tablet Take 81 mg by mouth daily.    [name] buPROPion (WELLBUTRIN SR) 100 MG 12 hr tablet Take 1 tablet (100 mg total) by mouth 2 (two) times daily. 60 tablet 5  . escitalopram (LEXAPRO) 10 MG tablet Take 10 mg by mouth daily. Reported on 04/14/2016    . ipratropium-albuterol (DUONEB) 0.5-2.5 (3) MG/3ML SOLN Take 3 mLs by nebulization every 6 (six) hours as needed (wheezing, shortness of breath). 360 mL 3  . losartan (COZAAR) 100 MG tablet Take 100 mg by mouth daily.    [name] omeprazole (PRILOSEC) 40 MG capsule Take 40 mg by mouth 3 times/day as needed-between meals & bedtime.    . pantoprazole (PROTONIX) 40 MG tablet Take 40 mg daily by mouth.    . tiotropium (SPIRIVA HANDIHALER) 18 MCG inhalation capsule Place 1 capsule (18 mcg total) into inhaler and inhale daily. 30 capsule 5  . cloNIDine (CATAPRES) 0.1 MG tablet Take 1 tablet (0.1 mg total) 2 (two) times daily by mouth. 60 tablet 11   No current facility-administered medications for this visit.      Allergies:   Codeine   Social History:  The patient  reports that he quit smoking about 4 months ago. His smoking use included cigarettes. He has a 17.00 pack-year smoking history. He quit smokeless tobacco use  about 15 months ago. His smokeless tobacco use included chew and snuff. He reports that he drinks alcohol. He reports that he does not use drugs.   Family History:   family history includes Breast cancer in his mother; Cancer in his father; Cerebral palsy in his brother; Tuberculosis in his paternal uncle.    Review of Systems: Review of Systems  Constitutional: Negative.   Respiratory: Positive for cough and shortness of breath.   Cardiovascular: Positive for chest pain.  Gastrointestinal: Negative.    Musculoskeletal: Negative.        Leg pain on exertion and at rest  Neurological: Negative.   Psychiatric/Behavioral: Negative.   All other systems reviewed and are negative.    PHYSICAL EXAM: VS:  BP (!) 160/94 (BP Location: Right Arm, Patient Position: Sitting, Cuff Size: Large)   Pulse 87   Ht 5\' 10"  (1.778 m)   Wt 275 lb (124.7 kg)   BMI 39.46 kg/m  , BMI Body mass index is 39.46 kg/m. GEN: Well nourished, well developed, in no acute distress , obese, smells like smoke HEENT: normal  Neck: no JVD, carotid bruits, or masses Cardiac: RRR; no murmurs, rubs, or gallops, trace edema R/ L  Decreased pulses lower extremities Respiratory: Moderately decreased breath sounds throughout, cough , normal work of breathing GI: soft, nontender, nondistended, + BS MS: no deformity or atrophy  Skin: warm and dry, no rash Neuro:  Strength and sensation are intact Psych: euthymic mood, full affect   Recent Labs: 01/06/2017: TSH 1.22 07/25/2017: B Natriuretic Peptide 40.0 08/04/2017: ALT 45; BUN 23; Creatinine, Ser 1.09; Hemoglobin 15.4; Platelets 218; Potassium 3.3; Sodium 135    Lipid Panel No results found for: CHOL, HDL, LDLCALC, TRIG    Wt Readings from Last 3 Encounters:  08/22/17 275 lb (124.7 kg)  08/04/17 264 lb (119.7 kg)  07/25/17 260 lb (117.9 kg)       ASSESSMENT AND PLAN:  Aortic atherosclerosis ([place]) - Plan: EKG 12-Lead CT scan images pulled up with him and reviewed in detail Moderate aortic atherosclerosis noted extending into the common iliac vessels Stressed the need for smoking cessation No recent lipid panel available  Essential hypertension - Plan: EKG 12-Lead Blood pressure elevated, recommended he start clonidine 0.1 mg twice daily in addition to his losartan We will avoid calcium channel blocker given mild lower extremity edema Will avoid diuretics given chronic renal insufficiency and low potassium The other options include hydralazine, nitrates,  Cardura  Chronic obstructive pulmonary disease, unspecified COPD type ([place]) - Plan: EKG 12-Lead On 3 inhalers, recommended smoking cessation Previously smoked 4 packs/day  Aortic valve stenosis Notable on exam, echocardiogram ordered Suspect moderate stenosis or higher  Sleep apnea with use of continuous positive airway pressure (CPAP) - Plan: EKG 12-Lead  Tobacco smoker within last 12 months - Plan: EKG 12-Lead We have encouraged him to continue to work on weaning his cigarettes and smoking cessation. He will continue to work on this and does not want any assistance with chantix.   Pancreatic mass Images pulled up in the office, CT scan Reviewed with him, discussed his follow-up with oncology  Leg pain Concerning for claudication pain Lower extremity arterial Doppler has been ordered High risk of stenosis given long history of smoking  Chest pain Concerning for angina CT scan showing coronary calcifications in the right coronary artery EKG concerning for old inferior MI Pharmacologic Myoview has been ordered  Disposition:   F/U  we will call him with the results  of his testing as above   Total encounter time more than 60 minutes  Greater than 50% was spent in counseling and coordination of care with the patient    Orders Placed This Encounter  Procedures  . EKG 12-Lead     Signed, Esmond Plants, M.D., Ph.D. 08/22/2017  [place], [place]

## 2017-08-22 ENCOUNTER — Telehealth: Payer: Self-pay

## 2017-08-22 ENCOUNTER — Telehealth: Payer: Self-pay | Admitting: *Deleted

## 2017-08-22 ENCOUNTER — Encounter: Payer: Self-pay | Admitting: Cardiovascular Disease

## 2017-08-22 ENCOUNTER — Ambulatory Visit: Payer: Medicare Other | Admitting: Cardiovascular Disease

## 2017-08-22 ENCOUNTER — Other Ambulatory Visit: Payer: Self-pay | Admitting: Cardiovascular Disease

## 2017-08-22 VITALS — BP 160/94 | HR 87 | Ht 70.0 in | Wt 275.0 lb

## 2017-08-22 DIAGNOSIS — R079 Chest pain, unspecified: Secondary | ICD-10-CM

## 2017-08-22 DIAGNOSIS — I700 Atherosclerosis of aorta: Secondary | ICD-10-CM

## 2017-08-22 DIAGNOSIS — K8689 Other specified diseases of pancreas: Secondary | ICD-10-CM

## 2017-08-22 DIAGNOSIS — K869 Disease of pancreas, unspecified: Secondary | ICD-10-CM | POA: Diagnosis not present

## 2017-08-22 DIAGNOSIS — F17200 Nicotine dependence, unspecified, uncomplicated: Secondary | ICD-10-CM

## 2017-08-22 DIAGNOSIS — R011 Cardiac murmur, unspecified: Secondary | ICD-10-CM | POA: Diagnosis not present

## 2017-08-22 DIAGNOSIS — I739 Peripheral vascular disease, unspecified: Secondary | ICD-10-CM | POA: Diagnosis not present

## 2017-08-22 DIAGNOSIS — J449 Chronic obstructive pulmonary disease, unspecified: Secondary | ICD-10-CM

## 2017-08-22 DIAGNOSIS — I10 Essential (primary) hypertension: Secondary | ICD-10-CM

## 2017-08-22 DIAGNOSIS — G4730 Sleep apnea, unspecified: Secondary | ICD-10-CM | POA: Diagnosis not present

## 2017-08-22 DIAGNOSIS — I350 Nonrheumatic aortic (valve) stenosis: Secondary | ICD-10-CM

## 2017-08-22 HISTORY — DX: Peripheral vascular disease, unspecified: I73.9

## 2017-08-22 MED ORDER — CLONIDINE HCL 0.1 MG PO TABS: 0.1000 mg | ORAL_TABLET | Freq: Two times a day (BID) | ORAL | 11 refills | Status: DC

## 2017-08-22 NOTE — Telephone Encounter (Signed)
error 

## 2017-08-22 NOTE — Telephone Encounter (Signed)
  Oncology Nurse Navigator Documentation I have spoken to Mr. Brownlow and he is scheduled for echo and LE arterial doppler on 11/20 and myoview on 11/21. We have arranged for Lucianne Lei transportation to all of these appointments. Unable to add on Dr Janese Banks for MRI follow up at this time due to length of appointments 11/20 and Dr. Janese Banks out of the office 11/21. We will have this follow up once he has finished with cardiology work up. Mr. Mawson is aware to be ready for pick up in the morning at 0900. Lucianne Lei driver left a voicemail regarding late add on appointment for transport by scheduling. Navigator Location: [facility] (08/22/17 1645)   )Navigator Encounter Type: Telephone (08/22/17 1645)                                                    Time Spent with Patient: 30 (08/22/17 1645)

## 2017-08-22 NOTE — Patient Instructions (Addendum)
Increase potassium intake  Medication Instructions:   For high blood pressure, Please start clonidine one pill twice a day  Labwork:  No new labs needed  Testing/Procedures:  We will order an echocardiogram for aortic valve disease, stenosis  Schedule Lower extremity arterial ultrasound for claudication on the same day Schedule before 09/01/17  We will order a lexiscan myoview for chest pain, CAD, abn EKG, unable to treadmill Please schedule beforw 11/29  Nathaniel Little  Your caregiver has ordered a Stress Test with nuclear imaging. The purpose of this test is to evaluate the blood supply to your heart muscle. This procedure is referred to as a "Non-Invasive Stress Test." This is because other than having an IV started in your vein, nothing is inserted or "invades" your body. Cardiac stress tests are done to find areas of poor blood flow to the heart by determining the extent of coronary artery disease (CAD). Some patients exercise on a treadmill, which naturally increases the blood flow to your heart, while others who are  unable to walk on a treadmill due to physical limitations have a pharmacologic/chemical stress agent called Lexiscan . This medicine will mimic walking on a treadmill by temporarily increasing your coronary blood flow.   Please note: these test may take anywhere between 2-4 hours to complete  PLEASE REPORT TO [facility] WILL DIRECT YOU WHERE TO GO  Date of Procedure:__Wednesday November 21st ___  Arrival Time for Procedure:___08:45AM____  Instructions regarding medication:   _X___ : Hold diabetes medication morning of procedure   PLEASE NOTIFY THE OFFICE AT LEAST 24 HOURS IN ADVANCE IF YOU ARE UNABLE TO KEEP YOUR APPOINTMENT.  [phone] AND  PLEASE NOTIFY NUCLEAR MEDICINE AT [facility] AT LEAST 24 HOURS IN ADVANCE IF YOU ARE UNABLE TO KEEP YOUR APPOINTMENT. [phone]  How to prepare for your Myoview  test:  1. Do not eat or drink after midnight 2. No caffeine for 24 hours prior to test 3. No smoking 24 hours prior to test. 4. Your medication may be taken with water.  If your doctor stopped a medication because of this test, do not take that medication. 5. Ladies, please do not wear dresses.  Skirts or pants are appropriate. Please wear a short sleeve shirt. 6. No perfume, cologne or lotion. 7. Wear comfortable walking shoes. No heels!      Follow-Up: It was a pleasure seeing you in the office today. Please call us if you have new issues that need to be addressed before your next appt.  [phone]  Your physician wants you to follow-up in:  We will call you with the results of the echo and stress test F/u 3 months  If you need a refill on your cardiac medications before your next appointment, please call your pharmacy.    Cardiac Nuclear Scan A cardiac nuclear scan is a test that measures blood flow to the heart when a person is resting and when he or she is exercising. The test looks for problems such as:  Not enough blood reaching a portion of the heart.  The heart muscle not working normally.  You may need this test if:  You have heart disease.  You have had abnormal lab results.  You have had heart surgery or angioplasty.  You have chest pain.  You have shortness of breath.  In this test, a radioactive dye (tracer) is injected into your bloodstream. After the tracer has traveled to your heart,  an imaging device is used to measure how much of the tracer is absorbed by or distributed to various areas of your heart. This procedure is usually done at a hospital and takes 2-4 hours. Tell a health care provider about:  Any allergies you have.  All medicines you are taking, including vitamins, herbs, eye drops, creams, and over-the-counter medicines.  Any problems you or family members have had with the use of anesthetic medicines.  Any blood disorders you  have.  Any surgeries you have had.  Any medical conditions you have.  Whether you are pregnant or may be pregnant. What are the risks? Generally, this is a safe procedure. However, problems may occur, including:  Serious chest pain and heart attack. This is only a risk if the stress portion of the test is done.  Rapid heartbeat.  Sensation of warmth in your chest. This usually passes quickly.  What happens before the procedure?  Ask your health care provider about changing or stopping your regular medicines. This is especially important if you are taking diabetes medicines or blood thinners.  Remove your jewelry on the day of the procedure. What happens during the procedure?  An IV tube will be inserted into one of your veins.  Your health care provider will inject a small amount of radioactive tracer through the tube.  You will wait for 20-40 minutes while the tracer travels through your bloodstream.  Your heart activity will be monitored with an electrocardiogram (ECG).  You will lie down on an exam table.  Images of your heart will be taken for about 15-20 minutes.  You may be asked to exercise on a treadmill or stationary bike. While you exercise, your heart's activity will be monitored with an ECG, and your blood pressure will be checked. If you are unable to exercise, you may be given a medicine to increase blood flow to parts of your heart.  When blood flow to your heart has peaked, a tracer will again be injected through the IV tube.  After 20-40 minutes, you will get back on the exam table and have more images taken of your heart.  When the procedure is over, your IV tube will be removed. The procedure may vary among health care providers and hospitals. Depending on the type of tracer used, scans may need to be repeated 3-4 hours later. What happens after the procedure?  Unless your health care provider tells you otherwise, you may return to your normal schedule,  including diet, activities, and medicines.  Unless your health care provider tells you otherwise, you may increase your fluid intake. This will help flush the contrast dye from your body. Drink enough fluid to keep your urine clear or pale yellow.  It is up to you to get your test results. Ask your health care provider, or the department that is doing the test, when your results will be ready. Summary  A cardiac nuclear scan measures the blood flow to the heart when a person is resting and when he or she is exercising.  You may need this test if you are at risk for heart disease.  Tell your health care provider if you are pregnant.  Unless your health care provider tells you otherwise, increase your fluid intake. This will help flush the contrast dye from your body. Drink enough fluid to keep your urine clear or pale yellow. This information is not intended to replace advice given to you by your health care provider. Make sure you  discuss any questions you have with your health care provider. Document Released: 10/15/2004 Document Revised: 09/22/2016 Document Reviewed: 08/29/2013 Elsevier Interactive Patient Education  2017 [employer]. Echocardiogram An echocardiogram, or echocardiography, uses sound waves (ultrasound) to produce an image of your heart. The echocardiogram is simple, painless, obtained within a short period of time, and offers valuable information to your health care provider. The images from an echocardiogram can provide information such as:  Evidence of coronary artery disease (CAD).  Heart size.  Heart muscle function.  Heart valve function.  Aneurysm detection.  Evidence of a past heart attack.  Fluid buildup around the heart.  Heart muscle thickening.  Assess heart valve function.  Tell a health care provider about:  Any allergies you have.  All medicines you are taking, including vitamins, herbs, eye drops, creams, and over-the-counter  medicines.  Any problems you or family members have had with anesthetic medicines.  Any blood disorders you have.  Any surgeries you have had.  Any medical conditions you have.  Whether you are pregnant or may be pregnant. What happens before the procedure? No special preparation is needed. Eat and drink normally. What happens during the procedure?  In order to produce an image of your heart, gel will be applied to your chest and a wand-like tool (transducer) will be moved over your chest. The gel will help transmit the sound waves from the transducer. The sound waves will harmlessly bounce off your heart to allow the heart images to be captured in real-time motion. These images will then be recorded.  You may need an IV to receive a medicine that improves the quality of the pictures. What happens after the procedure? You may return to your normal schedule including diet, activities, and medicines, unless your health care provider tells you otherwise. This information is not intended to replace advice given to you by your health care provider. Make sure you discuss any questions you have with your health care provider. Document Released: 09/17/2000 Document Revised: 05/08/2016 Document Reviewed: 05/28/2013 Elsevier Interactive Patient Education  2017 [employer].

## 2017-08-22 NOTE — Telephone Encounter (Signed)
Spoke with patient and reviewed Dr. Donivan Scull recommendations to have echo prior to appointment but we do not have anything available today. Earliest echo would be end of next week and then next available appointment with Dr. Rockey Situ is in January 2019. Patient prefers to keep scheduled appointment and will schedule echocardiogram when he is here for his appointment. He was appreciative for the call and had no further questions at this time.

## 2017-08-22 NOTE — Telephone Encounter (Signed)
-----   Message from Minna Merritts, MD sent at 08/20/2017 12:18 PM EST ----- Regarding: echo monday Can we arrange echo prior to or following his visit with me on Monday? For murmur thx TGollan

## 2017-08-23 ENCOUNTER — Other Ambulatory Visit: Payer: Self-pay | Admitting: Cardiovascular Disease

## 2017-08-23 ENCOUNTER — Ambulatory Visit (INDEPENDENT_AMBULATORY_CARE_PROVIDER_SITE_OTHER): Payer: Medicare Other

## 2017-08-23 ENCOUNTER — Other Ambulatory Visit: Payer: Self-pay

## 2017-08-23 DIAGNOSIS — I700 Atherosclerosis of aorta: Secondary | ICD-10-CM

## 2017-08-23 DIAGNOSIS — R011 Cardiac murmur, unspecified: Secondary | ICD-10-CM

## 2017-08-23 DIAGNOSIS — I739 Peripheral vascular disease, unspecified: Secondary | ICD-10-CM

## 2017-08-23 DIAGNOSIS — J449 Chronic obstructive pulmonary disease, unspecified: Secondary | ICD-10-CM

## 2017-08-23 DIAGNOSIS — R079 Chest pain, unspecified: Secondary | ICD-10-CM

## 2017-08-23 DIAGNOSIS — I10 Essential (primary) hypertension: Secondary | ICD-10-CM

## 2017-08-23 NOTE — Telephone Encounter (Signed)
I am working tomorrow on 11/21

## 2017-08-24 ENCOUNTER — Other Ambulatory Visit: Payer: Self-pay

## 2017-08-24 ENCOUNTER — Encounter: Payer: Self-pay | Admitting: Oncology

## 2017-08-24 ENCOUNTER — Ambulatory Visit
Admission: RE | Admit: 2017-08-24 | Discharge: 2017-08-24 | Disposition: A | Discharge status: Home / Self Care | Payer: Medicare Other | Source: Ambulatory Visit | Attending: Cardiovascular Disease | Admitting: Cardiovascular Disease

## 2017-08-24 ENCOUNTER — Inpatient Hospital Stay (HOSPITAL_BASED_OUTPATIENT_CLINIC_OR_DEPARTMENT_OTHER): Payer: Medicare Other | Admitting: Oncology

## 2017-08-24 VITALS — BP 127/72 | HR 82 | Temp 97.0°F | Resp 18 | Wt 277.4 lb

## 2017-08-24 DIAGNOSIS — Z87891 Personal history of nicotine dependence: Secondary | ICD-10-CM

## 2017-08-24 DIAGNOSIS — R9389 Abnormal findings on diagnostic imaging of other specified body structures: Secondary | ICD-10-CM

## 2017-08-24 DIAGNOSIS — I700 Atherosclerosis of aorta: Secondary | ICD-10-CM | POA: Diagnosis not present

## 2017-08-24 DIAGNOSIS — J449 Chronic obstructive pulmonary disease, unspecified: Secondary | ICD-10-CM | POA: Diagnosis not present

## 2017-08-24 DIAGNOSIS — K760 Fatty (change of) liver, not elsewhere classified: Secondary | ICD-10-CM | POA: Diagnosis not present

## 2017-08-24 DIAGNOSIS — R079 Chest pain, unspecified: Secondary | ICD-10-CM

## 2017-08-24 DIAGNOSIS — K219 Gastro-esophageal reflux disease without esophagitis: Secondary | ICD-10-CM | POA: Diagnosis not present

## 2017-08-24 DIAGNOSIS — G4733 Obstructive sleep apnea (adult) (pediatric): Secondary | ICD-10-CM | POA: Diagnosis not present

## 2017-08-24 DIAGNOSIS — K5900 Constipation, unspecified: Secondary | ICD-10-CM | POA: Diagnosis not present

## 2017-08-24 DIAGNOSIS — D490 Neoplasm of unspecified behavior of digestive system: Secondary | ICD-10-CM

## 2017-08-24 DIAGNOSIS — K869 Disease of pancreas, unspecified: Secondary | ICD-10-CM

## 2017-08-24 DIAGNOSIS — D136 Benign neoplasm of pancreas: Secondary | ICD-10-CM

## 2017-08-24 DIAGNOSIS — Z8546 Personal history of malignant neoplasm of prostate: Secondary | ICD-10-CM

## 2017-08-24 DIAGNOSIS — I10 Essential (primary) hypertension: Secondary | ICD-10-CM | POA: Diagnosis not present

## 2017-08-24 DIAGNOSIS — R011 Cardiac murmur, unspecified: Secondary | ICD-10-CM | POA: Diagnosis not present

## 2017-08-24 DIAGNOSIS — M1990 Unspecified osteoarthritis, unspecified site: Secondary | ICD-10-CM | POA: Diagnosis not present

## 2017-08-24 MED ORDER — TECHNETIUM TC 99M TETROFOSMIN IV KIT
12.5700 | PACK | Freq: Once | INTRAVENOUS | Status: AC | PRN
  Administered 2017-08-24: 12.57 via INTRAVENOUS

## 2017-08-24 MED ORDER — REGADENOSON 0.4 MG/5ML IV SOLN
0.4000 mg | Freq: Once | INTRAVENOUS | Status: AC
  Administered 2017-08-24: 0.4 mg via INTRAVENOUS

## 2017-08-24 MED ORDER — TECHNETIUM TC 99M TETROFOSMIN IV KIT
32.2510 | PACK | Freq: Once | INTRAVENOUS | Status: AC | PRN
  Administered 2017-08-24: 32.251 via INTRAVENOUS

## 2017-08-24 NOTE — Progress Notes (Signed)
Patient denies any concerns today.  

## 2017-08-24 NOTE — Progress Notes (Signed)
Hematology/Oncology Consult note [facility]  Telephone:([phone] [phone]  Patient Care Team: [name], NP as PCP - General Clent Jacks, RN as Registered Nurse   Name of the patient: Kensington Duerst  [id number]  1946/02/13   Date of visit: 08/24/17  Diagnosis- possible IPMN findings on MRI  Chief complaint/ Reason for visit- discuss results of MRI  Heme/Onc history: patient is a 71 year old male who presented to the ER with some symptoms of cough and shortness of breath as well as constipation which was ongoing for 2 weeks.    He underwent CT abdomen which showed abnormal appearance of the pancreas with several large calculi along the dorsal pancreatic duct, apparent prominent dilated segment of the dorsal pancreatic duct in the pancreatic body and a cystic mass along the upper margin of the pancreatic body measuring up to 3.6 cm in long axis with suspected internal septations.  This most likely is a cystic serous neoplasm but is not entirely specific.  Pancreatic protocol MRI is recommended.  Diffuse hepatic steatosis.  Degenerative disc disease.  Faint hypodensity in the right lower lobe probably due to motion artifact.  Not considered specific for pulmonary embolus.  Patient reports that his cough and shortness of breath has now improved.  He lives alone and is independent of his ADLs but does have problems with transportation for his medical visits.  He has a strong history of alcohol intake between the age of 29 into his 63s when he ultimately stopped drinking.  Denies any history of gallbladder issues or history of pancreatitis in the past.  Currently denies any abdominal pain, fevers, chills, unintentional weight loss.  He also has a history of cardiac murmur which he says has been ongoing for a long time.  Patient was recently seen by Dr. Rockey Situ for his cardiac murmur and is undergoing workup with them  Patient was supposed  to get an EUS by Dr. Francella Solian which was canceled due to cardiac clearance.    Interval history-currently denies any chest pain but has chronic shortness of breath on exertion  ECOG PS- 1 Pain scale- 3  Review of systems- Review of Systems  Constitutional: Positive for malaise/fatigue. Negative for chills, fever and weight loss.  HENT: Negative for congestion, ear discharge and nosebleeds.   Eyes: Negative for blurred vision.  Respiratory: Positive for shortness of breath. Negative for cough, hemoptysis, sputum production and wheezing.   Cardiovascular: Negative for chest pain, palpitations, orthopnea and claudication.  Gastrointestinal: Negative for abdominal pain, blood in stool, constipation, diarrhea, heartburn, melena, nausea and vomiting.  Genitourinary: Negative for dysuria, flank pain, frequency, hematuria and urgency.  Musculoskeletal: Negative for back pain, joint pain and myalgias.  Skin: Negative for rash.  Neurological: Negative for dizziness, tingling, focal weakness, seizures, weakness and headaches.  Endo/Heme/Allergies: Does not bruise/bleed easily.  Psychiatric/Behavioral: Negative for depression and suicidal ideas. The patient does not have insomnia.       Allergies  Allergen Reactions  . Codeine Itching     Past Medical History:  Diagnosis Date  . Arthritis   . Cancer ([place]) 03/2013   Prostate  . COPD (chronic obstructive pulmonary disease) ([place])   . Depression   . Heart disease   . Heart murmur   . Hypertension   . Lymphedema   . Sleep apnea with use of continuous positive airway pressure (CPAP)      Past Surgical History:  Procedure Laterality Date  . ADENOIDECTOMY    .  APPENDECTOMY    . TONSILLECTOMY      Social History   Socioeconomic History  . Marital status: Widowed    Spouse name: Not on file  . Number of children: Not on file  . Years of education: Not on file  . Highest education level: Not on file  Social Needs  . Financial  resource strain: Not on file  . Food insecurity - worry: Not on file  . Food insecurity - inability: Not on file  . Transportation needs - medical: Not on file  . Transportation needs - non-medical: Not on file  Occupational History  . Not on file  Tobacco Use  . Smoking status: Former Smoker    Packs/day: 0.25    Years: 68.00    Pack years: 17.00    Types: Cigarettes    Last attempt to quit: 04/01/2017    Years since quitting: 0.3  . Smokeless tobacco: Former Systems developer    Types: Chew, [place] date: 05/16/2016  . Tobacco comment: 2 Cigarettes a day   Substance and Sexual Activity  . Alcohol use: Yes    Comment: occ  . Drug use: No  . Sexual activity: Not Currently  Other Topics Concern  . Not on file  Social History Narrative  . Not on file    Family History  Problem Relation Age of Onset  . Breast cancer Mother   . Cancer Father        Black Lung  . Cerebral palsy Brother   . Tuberculosis Paternal Uncle   . Prostate cancer Neg Hx   . Kidney cancer Neg Hx   . Bladder Cancer Neg Hx      Current Outpatient Medications:  .  albuterol (PROVENTIL HFA;VENTOLIN HFA) 108 (90 Base) MCG/ACT inhaler, Inhale 2 puffs into the lungs every 6 (six) hours as needed for wheezing or shortness of breath., Disp: 1 Inhaler, Rfl: 0 .  aspirin 81 MG tablet, Take 81 mg by mouth daily., Disp: , Rfl:  .  buPROPion (WELLBUTRIN SR) 100 MG 12 hr tablet, Take 1 tablet (100 mg total) by mouth 2 (two) times daily., Disp: 60 tablet, Rfl: 5 .  cloNIDine (CATAPRES) 0.1 MG tablet, Take 1 tablet (0.1 mg total) 2 (two) times daily by mouth., Disp: 60 tablet, Rfl: 11 .  escitalopram (LEXAPRO) 10 MG tablet, Take 10 mg by mouth daily. Reported on 04/14/2016, Disp: , Rfl:  .  ipratropium-albuterol (DUONEB) 0.5-2.5 (3) MG/3ML SOLN, Take 3 mLs by nebulization every 6 (six) hours as needed (wheezing, shortness of breath)., Disp: 360 mL, Rfl: 3 .  losartan (COZAAR) 100 MG tablet, Take 100 mg by mouth daily., Disp:  , Rfl:  .  omeprazole (PRILOSEC) 40 MG capsule, Take 40 mg by mouth 3 times/day as needed-between meals & bedtime., Disp: , Rfl:  .  pantoprazole (PROTONIX) 40 MG tablet, Take 40 mg daily by mouth., Disp: , Rfl:  .  tiotropium (SPIRIVA HANDIHALER) 18 MCG inhalation capsule, Place 1 capsule (18 mcg total) into inhaler and inhale daily., Disp: 30 capsule, Rfl: 5  Physical exam:  Vitals:   08/24/17 1206  BP: 127/72  Pulse: 82  Resp: 18  Temp: (!) 97 F (36.1 C)  TempSrc: Tympanic  Weight: 277 lb 6.4 oz (125.8 kg)   Physical Exam  Constitutional: He is oriented to person, place, and time.  Patient is obese and appears in no acute distress  HENT:  Head: Normocephalic and atraumatic.  Eyes: EOM are  normal. Pupils are equal, round, and reactive to light.  Neck: Normal range of motion.  Cardiovascular: Normal rate and regular rhythm.  Systolic murmur positive  Pulmonary/Chest: Effort normal and breath sounds normal.  Abdominal: Soft. Bowel sounds are normal.  Neurological: He is alert and oriented to person, place, and time.  Skin: Skin is warm and dry.     CMP Latest Ref Rng & Units 08/04/2017  Glucose 65 - 99 mg/dL 114(H)  BUN 6 - 20 mg/dL 23(H)  Creatinine 0.61 - 1.24 mg/dL 1.09  Sodium 135 - 145 mmol/L 135  Potassium 3.5 - 5.1 mmol/L 3.3(L)  Chloride 101 - 111 mmol/L 101  CO2 22 - 32 mmol/L 25  Calcium 8.9 - 10.3 mg/dL 9.0  Total Protein 6.5 - 8.1 g/dL 7.2  Total Bilirubin 0.3 - 1.2 mg/dL 0.7  Alkaline Phos 38 - 126 U/L 76  AST 15 - 41 U/L 34  ALT 17 - 63 U/L 45   CBC Latest Ref Rng & Units 08/04/2017  WBC 3.8 - 10.6 K/uL 9.9  Hemoglobin 13.0 - 18.0 g/dL 15.4  Hematocrit 40.0 - 52.0 % 45.1  Platelets 150 - 440 K/uL 218    No images are attached to the encounter.  Mr Abdomen W Wo Contrast  Result Date: 08/18/2017 CLINICAL DATA:  Evaluate cystic pancreatic lesions seen on recent CT scan. EXAM: MRI ABDOMEN WITHOUT AND WITH CONTRAST TECHNIQUE: Multiplanar multisequence  MR imaging of the abdomen was performed both before and after the administration of intravenous contrast. CONTRAST:  79mL MULTIHANCE GADOBENATE DIMEGLUMINE 529 MG/ML IV SOLN COMPARISON:  CT scan 07/25/2017 FINDINGS: Lower chest: The lung bases are grossly clear. No obvious pulmonary lesions or pleural or pericardial effusion. Hepatobiliary: Moderate diffuse fatty infiltration but no focal hepatic lesions or intrahepatic biliary dilatation. The gallbladder appears normal. Normal caliber and course of the common bile duct. Pancreas: Pancreatic abnormalities as described on the recent abdominal CT scan. There is a multi septated cystic lesion in the pancreatic head measuring approximately 26 x 22 mm. Proximal to this lesion the main pancreatic duct is dilated up to 12 mm. Superior to the pancreatic duct in the pancreatic body is a 3.6 x 2.6 cm multi septated cystic lesion. The remainder the pancreatic body and tail are atrophied with mild ductal dilatation. The CT scan also showed multiple the parenchymal calcifications involving the pancreas. After contrast administration. There does appear to be some septal enhancement in both pancreatic lesions but no discrete enhancing soft tissue mass. These lesions do not have typical appearance of pancreatic pseudocysts and are more likely serous cystic neoplasms or IPMNs. The smaller lesion in the pancreatic head appears to have a communication with the pancreatic duct and this may also be true of the larger pancreatic body lesion. Spleen:  Normal size.  No focal lesions. Adrenals/Urinary Tract: The adrenal glands and kidneys are unremarkable. Small renal cysts are noted. Stomach/Bowel: The stomach, duodenum, visualized small bowel and visualize colon are unremarkable. Vascular/Lymphatic: The aorta and branch vessels are patent. No mesenteric or retroperitoneal mass or adenopathy. Other:  No ascites or abdominal wall hernia. Musculoskeletal: No significant bony findings.  IMPRESSION: 1. Multi septated cystic lesions in the pancreatic head and body as described above and also seen on recent CT scan. These are most likely serous cystic neoplasms or IPMNs. No worrisome MR imaging features such as soft tissue component or areas of solid nodular enhancement. Endoscopic ultrasound assessment and biopsy may be helpful for further evaluation. Alternatively a short-term  followup MRI examination in 4-6 months may be reasonable. 2. No other significant findings. No evidence of metastatic disease or adenopathy. Electronically Signed   By: Marijo Sanes M.D.   On: 08/18/2017 16:53   Ct Abdomen Pelvis W Contrast  Result Date: 07/25/2017 CLINICAL DATA:  Shortness of breath and cough for 2 weeks. Constipation. EXAM: CT ABDOMEN AND PELVIS WITH CONTRAST TECHNIQUE: Multidetector CT imaging of the abdomen and pelvis was performed using the standard protocol following bolus administration of intravenous contrast. CONTRAST:  155mL ISOVUE-300 IOPAMIDOL (ISOVUE-300) INJECTION 61% COMPARISON:  Abdomen radiographs from 07/12/2016 FINDINGS: Lower chest: Faint hypodensity in the right lower lobe pulmonary artery on images 1-2 of series 2 is probably most attributable to the motion artifact present in the right lung on these images. Subsegmental atelectasis or scarring in the lingula. Coronary and descending thoracic aortic atherosclerotic calcification. Hepatobiliary: Diffuse hepatic steatosis with slight sparing along the gallbladder fossa. No biliary dilatation. Gallbladder unremarkable. Pancreas: Dilated dorsal pancreatic duct especially along a 4.5 cm segment of the duct in the pancreatic body where the dorsal pancreatic duct measures up to 12.3 cm in diameter. Cyst dorsal to this there is a fairly low density 2.0 by 3.3 by 3.6 cm mass. Irregular calcification measuring 1.7 by 1.0 by 0.9 cm in the dorsal pancreatic duct in the pancreatic head. Separate calcification along the dorsal pancreatic duct in  the pancreatic tail measuring 1.3 by 0.7 by 0.7 cm. Indistinct hypodensity in the pancreatic head centrally possibly from dilated duct but technically nonspecific. Spleen: Unremarkable Adrenals/Urinary Tract: There is contrast medium in the collecting systems due to an initial injection with IV injection failure. Multiple small indistinct hypodense lesions in both kidneys are technically too small to characterize, although statistically likely to be benign. One lesion is Complex on image 32/2, suggesting a chronic complex cyst but potentially meriting surveillance. Sensitivity for nonobstructive renal calculi is low due to the pre-existing contrast medium along the urothelium. Urinary bladder unremarkable. Stomach/Bowel: Tortuosity of the descending duodenum observed. Otherwise unremarkable Vascular/Lymphatic: Aortoiliac atherosclerotic vascular disease. No pathologic adenopathy. Reproductive: Fiducials are present along the margins of the prostate gland. Other: No supplemental non-categorized findings. Musculoskeletal: Fatty left spermatic cord. Moderate degenerative arthropathy of both hips. Considerable lumbar spondylosis with grade 1 anterolisthesis at L4-5 at L5-S1 and impingement at all levels between L1 and S1. The most notable central narrowing of the thecal sac is at the L3-4 and L4-5 levels. IMPRESSION: 1. Notably abnormal appearance of the pancreas with several large calculi along the dorsal pancreatic duct ; a prominently dilated segment of the dorsal pancreatic duct in the pancreatic body; and a cystic mass along the upper margin of the pancreatic body measuring up to 3.6 cm in long axis with suspected internal septations. This is most likely a cystic serous neoplasm but is not entirely specific. Given the pancreatic abnormalities and cystic mass, pancreatic protocol MRI with and without contrast is recommended for further workup. 2. Diffuse hepatic steatosis. 3.  Aortic Atherosclerosis (ICD10-I70.0).   Coronary atherosclerosis. 4. Considerable lumbar spondylosis and degenerative disc disease causing impingement at all lumbar levels. 5. Fiducials along the margins of the prostate gland suggesting prior treatment for prostate cancer, correlate with patient history. No adenopathy or compelling findings of active malignancy. 6. There is faint hypodensity in the right lower lobe pulmonary artery on images 1-2 of series 2. This is probably due to motion artifact in the right lung. The appearance is not considered specific for pulmonary embolus given the degree  of motion artifact, however if there are risk factors or concerns for pulmonary embolus based on patient history then dedicated imaging workup for pulmonary embolus may be warranted. Electronically Signed   By: Van Clines M.D.   On: 07/25/2017 14:49   Dg Chest Port 1 View  Result Date: 07/25/2017 CLINICAL DATA:  Increasing shortness of breath and cough. EXAM: PORTABLE CHEST 1 VIEW COMPARISON:  Chest x-rays dated July 12, 2016 and June 14, 2016. FINDINGS: The cardiomediastinal silhouette is normal in size. Normal pulmonary vascularity. Bibasilar atelectasis No focal consolidation, pleural effusion, or pneumothorax. No acute osseous abnormality. IMPRESSION: Bibasilar atelectasis.  No active cardiopulmonary disease. Electronically Signed   By: Titus Dubin M.D.   On: 07/25/2017 12:42     Assessment and plan- Patient is a 71 y.o. male with findings of possible IPMN on MRI  I have personally reviewed patient's MRI and have discussed the findings of the MRI with the patient.  MRI abdomen shows cystic lesions in the pancreatic head and body with multiple septations consistent with serous cystic neoplasm or I PMN.  There were no other worrisome MRI findings such as solid tissue component or solid nodular areas noted.  There was no evidence of metastatic disease or adenopathy. These findings are not consistent with pancreatic adenocarcinoma.   Dr. Francella Solian is of the opinion that EUS at this time could be a nondiagnostic study and given his underlying cardiac issues he is unsure of EUS would change management especially given that he is not likely a surgical candidate.  I would like him to be followed up by gastroenterology at this time given that he has findings of IPMN and I will make a referral to [facility] for the same.  Given that there is no overt concern for pancreatic cancer at this time he does not need oncology follow-up.  Patient will also continue to follow-up with cardiology for his underlying cardiac issues   Visit Diagnosis 1. IPMN (intraductal papillary mucinous neoplasm)      Dr. Randa Evens, MD, MPH [facility] Pager- [phone] 08/24/2017 12:29 PM

## 2017-08-26 LAB — NM MYOCAR MULTI W/SPECT W/WALL MOTION / EF
CHL CUP NUCLEAR SDS: 0
CSEPPHR: 92 {beats}/min
LV sys vol: 48 mL
LVDIAVOL: 106 mL (ref 62–150)
Percent HR: 62 %
Rest HR: 76 {beats}/min
SRS: 5
SSS: 0
TID: 0.91

## 2017-08-29 ENCOUNTER — Ambulatory Visit: Payer: Medicare Other | Admitting: Gastroenterology

## 2017-08-29 NOTE — Progress Notes (Signed)
Thank you :)

## 2017-09-01 ENCOUNTER — Encounter: Admission: RE | Payer: Self-pay | Source: Ambulatory Visit

## 2017-09-01 ENCOUNTER — Ambulatory Visit: Admission: RE | Admit: 2017-09-01 | Payer: Medicare Other | Source: Ambulatory Visit | Admitting: Gastroenterology

## 2017-09-01 SURGERY — ULTRASOUND, UPPER GI TRACT, ENDOSCOPIC
Anesthesia: General

## 2017-09-01 SURGERY — UPPER ESOPHAGEAL ENDOSCOPIC ULTRASOUND (EUS)
Anesthesia: General

## 2017-09-08 ENCOUNTER — Ambulatory Visit (INDEPENDENT_AMBULATORY_CARE_PROVIDER_SITE_OTHER): Payer: Medicare Other | Admitting: Gastroenterology

## 2017-09-08 ENCOUNTER — Encounter: Payer: Self-pay | Admitting: Gastroenterology

## 2017-09-08 ENCOUNTER — Telehealth: Payer: Self-pay | Admitting: Cardiovascular Disease

## 2017-09-08 VITALS — BP 155/88 | HR 73 | Temp 97.5°F | Ht 69.0 in | Wt 275.6 lb

## 2017-09-08 DIAGNOSIS — K869 Disease of pancreas, unspecified: Secondary | ICD-10-CM | POA: Diagnosis not present

## 2017-09-08 NOTE — Telephone Encounter (Signed)
   [facility] Pre-operative Risk Assessment    Request for surgical clearance:  1. What type of surgery is being performed? Endoscopic Ultrasound  2. When is this surgery scheduled? Awaiting clearance  3. Are there any medications that need to be held prior to surgery and how long? Not listed  4. Practice name and name of physician performing surgery? [facility] ,provider not listed  5. What is your office phone and fax number? [phone], fax, [phone]  6. Anesthesia type (None, local, MAC, general) ? general   Nathaniel Little 09/08/2017, 3:33 PM  _________________________________________________________________   (provider comments below)

## 2017-09-08 NOTE — Progress Notes (Signed)
Nathaniel Bellows MD, MRCP(U.K) [address]  [place]  [place], [place] [address]  Main: [phone]  Fax: [phone]   Gastroenterology Consultation  Referring Provider:     [name], * Primary Care Physician:  [name], NP Primary Gastroenterologist:  Dr. Jonathon Little  Reason for Consultation:     Pancreatic lesion         HPI:   Nathaniel Little is a 71 y.o. y/o male referred for consultation & management  by Dr. Merrilyn Puma, Jerrel Ivory, NP.    Biopsy he is a 71 year old gentleman who underwent a CT scan of the abdomen recently that showed an abnormal appearance of the pancreas with several large calculi along the dorsal pancreatic duct, prominent dilated segment of the dorsal pancreatic duct in the pancreatic body and a cystic mass along the upper margin of the pancreatic body measuring up to 3.6 cm with internal septations.  Concern was for cystic serous neoplasm.  An MRI of the abdomen showed a multiseptated cystic lesions in the pancreatic head and body similar to features seen on recent CT scan.  Most likely feel a cystic neoplasm or I PMNs.  No worrisome MR imaging features.  The MRI suggested further EUS evaluation with a short-term follow-up.  CMP showed no abnormalities of his transaminases, albumin was 4.1, total bilirubin 0.7 hemoglobin of 15.4.Recent cardiac stress test showed no significant ischemia.  Denies any weight loss, no family history or personal history of pancreatitis or pancreatic cancer. Denies any abdominal pain . He is a smoker.   Past Medical History:  Diagnosis Date  . Arthritis   . Cancer ([place]) 03/2013   Prostate  . COPD (chronic obstructive pulmonary disease) ([place])   . Depression   . Heart disease   . Heart murmur   . Hypertension   . Lymphedema   . Sleep apnea with use of continuous positive airway pressure (CPAP)     Past Surgical History:  Procedure Laterality Date  . ADENOIDECTOMY    . APPENDECTOMY    .  TONSILLECTOMY      Prior to Admission medications   Medication Sig Start Date End Date Taking? Authorizing Provider  albuterol (PROVENTIL HFA;VENTOLIN HFA) 108 (90 Base) MCG/ACT inhaler Inhale 2 puffs into the lungs every 6 (six) hours as needed for wheezing or shortness of breath. 07/25/17  Yes Darel Hong, MD  aspirin 81 MG tablet Take 81 mg by mouth daily.   Yes [provider]  buPROPion (WELLBUTRIN SR) 100 MG 12 hr tablet Take 1 tablet (100 mg total) by mouth 2 (two) times daily. 04/20/17  Yes [name], NP  cloNIDine (CATAPRES) 0.1 MG tablet Take 1 tablet (0.1 mg total) 2 (two) times daily by mouth. 08/22/17  Yes Gollan, Kathlene November, MD  escitalopram (LEXAPRO) 10 MG tablet Take 10 mg by mouth daily. Reported on 04/14/2016   Yes [provider]  ipratropium-albuterol (DUONEB) 0.5-2.5 (3) MG/3ML SOLN Take 3 mLs by nebulization every 6 (six) hours as needed (wheezing, shortness of breath). 04/20/17  Yes [name], NP  losartan (COZAAR) 100 MG tablet Take 100 mg by mouth daily.   Yes [provider]  omeprazole (PRILOSEC) 40 MG capsule Take 40 mg by mouth 3 times/day as needed-between meals & bedtime. 11/11/16  Yes [provider]  Polyethylene Glycol 3350 (PEG 3350) POWD As directed for colonic prep. 11/11/16  Yes [provider]  tiotropium (SPIRIVA HANDIHALER) 18 MCG inhalation capsule Place 1 capsule (  18 mcg total) into inhaler and inhale daily. 04/20/17  Yes [name], NP  pantoprazole (PROTONIX) 40 MG tablet Take 40 mg daily by mouth.    [provider]    Family History  Problem Relation Age of Onset  . Breast cancer Mother   . Cancer Father        Black Lung  . Cerebral palsy Brother   . Tuberculosis Paternal Uncle   . Prostate cancer Neg Hx   . Kidney cancer Neg Hx   . Bladder Cancer Neg Hx      Social History   Tobacco Use  . Smoking status: Former Smoker    Packs/day: 0.25    Years:  68.00    Pack years: 17.00    Types: Cigarettes    Last attempt to quit: 04/01/2017    Years since quitting: 0.4  . Smokeless tobacco: Former Systems developer    Types: Chew, [place] date: 05/16/2016  . Tobacco comment: 2 Cigarettes a day   Substance Use Topics  . Alcohol use: Yes    Comment: occ  . Drug use: No    Allergies as of 09/08/2017 - Review Complete 09/08/2017  Allergen Reaction Noted  . Codeine Itching 05/23/2016    Review of Systems:    All systems reviewed and negative except where noted in HPI.   Physical Exam:  BP (!) 155/88 (BP Location: Left Arm, Patient Position: Sitting, Cuff Size: Large)   Pulse 73   Temp (!) 97.5 F (36.4 C) (Oral)   Ht 5\' 9"  (1.753 m)   Wt 275 lb 9.6 oz (125 kg)   BMI 40.70 kg/m  No LMP for male patient. Psych:  Alert and cooperative. Normal mood and affect. General:   Alert,  Well-developed, well-nourished, pleasant and cooperative in NAD Head:  Normocephalic and atraumatic. Eyes:  Sclera clear, no icterus.   Conjunctiva pink. Ears:  Normal auditory acuity. Nose:  No deformity, discharge, or lesions. Mouth:  No deformity or lesions,oropharynx pink & moist. Neck:  Supple; no masses or thyromegaly. Lungs:  Respirations even and unlabored.  Clear throughout to auscultation.   No wheezes, crackles, or rhonchi. No acute distress. Heart:  Regular rate and rhythm; systolic murmur over the precordium . No , clicks, rubs, or gallops. Abdomen:  Normal bowel sounds.  No bruits.  Soft, non-tender and non-distended without masses, hepatosplenomegaly or hernias noted.  No guarding or rebound tenderness.    Neurologic:  Alert and oriented x3;  grossly normal neurologically. Skin:  Intact without significant lesions or rashes. No jaundice. Lymph Nodes:  No significant cervical adenopathy. Psych:  Alert and cooperative. Normal mood and affect.  Imaging Studies: Mr Abdomen W Wo Contrast  Result Date: 08/18/2017 CLINICAL DATA:  Evaluate cystic  pancreatic lesions seen on recent CT scan. EXAM: MRI ABDOMEN WITHOUT AND WITH CONTRAST TECHNIQUE: Multiplanar multisequence MR imaging of the abdomen was performed both before and after the administration of intravenous contrast. CONTRAST:  36mL MULTIHANCE GADOBENATE DIMEGLUMINE 529 MG/ML IV SOLN COMPARISON:  CT scan 07/25/2017 FINDINGS: Lower chest: The lung bases are grossly clear. No obvious pulmonary lesions or pleural or pericardial effusion. Hepatobiliary: Moderate diffuse fatty infiltration but no focal hepatic lesions or intrahepatic biliary dilatation. The gallbladder appears normal. Normal caliber and course of the common bile duct. Pancreas: Pancreatic abnormalities as described on the recent abdominal CT scan. There is a multi septated cystic lesion in the pancreatic head measuring approximately 26 x 22 mm. Proximal to  this lesion the main pancreatic duct is dilated up to 12 mm. Superior to the pancreatic duct in the pancreatic body is a 3.6 x 2.6 cm multi septated cystic lesion. The remainder the pancreatic body and tail are atrophied with mild ductal dilatation. The CT scan also showed multiple the parenchymal calcifications involving the pancreas. After contrast administration. There does appear to be some septal enhancement in both pancreatic lesions but no discrete enhancing soft tissue mass. These lesions do not have typical appearance of pancreatic pseudocysts and are more likely serous cystic neoplasms or IPMNs. The smaller lesion in the pancreatic head appears to have a communication with the pancreatic duct and this may also be true of the larger pancreatic body lesion. Spleen:  Normal size.  No focal lesions. Adrenals/Urinary Tract: The adrenal glands and kidneys are unremarkable. Small renal cysts are noted. Stomach/Bowel: The stomach, duodenum, visualized small bowel and visualize colon are unremarkable. Vascular/Lymphatic: The aorta and branch vessels are patent. No mesenteric or  retroperitoneal mass or adenopathy. Other:  No ascites or abdominal wall hernia. Musculoskeletal: No significant bony findings. IMPRESSION: 1. Multi septated cystic lesions in the pancreatic head and body as described above and also seen on recent CT scan. These are most likely serous cystic neoplasms or IPMNs. No worrisome MR imaging features such as soft tissue component or areas of solid nodular enhancement. Endoscopic ultrasound assessment and biopsy may be helpful for further evaluation. Alternatively a short-term followup MRI examination in 4-6 months may be reasonable. 2. No other significant findings. No evidence of metastatic disease or adenopathy. Electronically Signed   By: Marijo Sanes M.D.   On: 08/18/2017 16:53   Nm Myocar Multi W/spect W/wall Motion / Ef  Result Date: 08/26/2017 Pharmacological myocardial perfusion imaging study with no significant  ischemia Normal wall motion, EF estimated at 53% No EKG changes concerning for ischemia at peak stress or in recovery. Low risk scan Signed, Esmond Plants, MD, Ph.D [facility]    Assessment and Plan:   Edis Huish is a 71 y.o. y/o male has been referred for a cystic septated lesion seen recently on CT/MRI which could be either IPMN or a serous cystic neoplasm.   Plan  1. Discussed options - EUS vs MRI in a few months vs not doing anything vs EUS with FNA. His answer was " I want to know if I have cancer or a pre cancerous lesion" , he said he will decide on the outcome of the lesion once he knows better about what it is  2. He is willing to proceed with EUS+FNA , will need prior cardiac clearance.   Follow up in 3 months   Dr Nathaniel Bellows MD,MRCP(U.K)

## 2017-09-11 NOTE — Telephone Encounter (Signed)
Acceptable risk for surgery  no further testing needed

## 2017-09-14 NOTE — Telephone Encounter (Signed)
Note routed through Epic to [place] at [place]

## 2017-09-16 ENCOUNTER — Telehealth: Payer: Self-pay

## 2017-09-16 ENCOUNTER — Other Ambulatory Visit: Payer: Self-pay

## 2017-09-16 DIAGNOSIS — K8689 Other specified diseases of pancreas: Secondary | ICD-10-CM

## 2017-09-16 NOTE — Telephone Encounter (Signed)
Voicemail left with Mr. Wanzer to return call for EUS scheduling. Oncology Nurse Navigator Documentation  Navigator Location: [facility] (09/16/17 0900)   )Navigator Encounter Type: Telephone (09/16/17 0900) Telephone: [place] Call (09/16/17 0900)                       Barriers/Navigation Needs: Coordination of Care (09/16/17 0900)   Interventions: Coordination of Care (09/16/17 0900)   Coordination of Care: EUS (09/16/17 0900)                  Time Spent with Patient: 15 (09/16/17 0900)

## 2017-09-19 ENCOUNTER — Telehealth: Payer: Self-pay

## 2017-09-19 NOTE — Telephone Encounter (Signed)
Nathaniel Little returned call. He is attempting to arrange transportation for EUS on 1/10. Once confirmed he will call me back and we will arrange. Oncology Nurse Navigator Documentation  Navigator Location: [facility] (09/19/17 1100)   )Navigator Encounter Type: Telephone (09/19/17 1100) Telephone: [place] Call (09/19/17 1100)                               Coordination of Care: EUS (09/19/17 1100)                  Time Spent with Patient: 15 (09/19/17 1100)

## 2017-09-20 ENCOUNTER — Other Ambulatory Visit: Payer: Self-pay

## 2017-09-20 ENCOUNTER — Telehealth: Payer: Self-pay

## 2017-09-20 NOTE — Telephone Encounter (Signed)
[name] returned call. He has arranged for [facility] to bring him to EUS and his daughter will pick him up for after care. He has been scheduled for 10/13/17 with Dr. Mont Dutton at [facility]. Went over instructions and copy mailed to home address along with my contact information for any further questions. Instructed to take clonidine and cozaar at 6am with a sip of water. He was also instructed to bring his cpap. Arrival time at the medical mall at 1215p. Read back performed.  INSTRUCTIONS FOR ENDOSCOPIC ULTRASOUND -Your procedure has been scheduled for January 10th with Dr. Mont Dutton at [facility]. -The hospital may contact you to pre-register over the phone.  -To get your scheduled arrival time, please call the Endoscopy unit at  [phone] between 1-3 p.m. on:  January 9th   -ON THE DAY OF YOU PROCEDURE:   1. If you are scheduled for a morning procedure, nothing to drink after midnight  -If you are scheduled for an afternoon procedure, you may have clear liquids until 5 hours prior  to the procedure but no carbonated drinks or broth  2. NO FOOD THE DAY OF YOUR PROCEDURE  3. You may take your heart, seizure, blood pressure, Parkinson's or breathing medications at  6am with just enough water to get your pills down  4. Do not take any oral Diabetic medications the morning of your procedure.  5. If you are a diabetic and are using insulin, please notify your prescribing physician of this  procedure as your dose may need to be altered related to not being able to eat or drink.   5. Do not take vitamins, iron, or fish oil for 5 days before your procedure     -On the day of your procedure, come to the [facility] Admitting/Registration desk (First desk on the right) at the scheduled arrival time. You MUST have someone drive you home from your procedure. You must have a responsible adult with a valid driver's license who is on site throughout your entire procedure and who can stay  with you for several hours after your procedure. You may not go home alone in a taxi, shuttle [place] or bus, as the drivers will not be responsible for you.  --If you have any questions please call me at the above contact Oncology Nurse Navigator Documentation  Navigator Location: [facility] (09/20/17 1100)   )Navigator Encounter Type: Telephone (09/20/17 1100) Telephone: Incoming Call;Appt Confirmation/Clarification;Education (09/20/17 1100)                               Coordination of Care: EUS (09/20/17 1100)                  Time Spent with Patient: 45 (09/20/17 1100)

## 2017-09-29 ENCOUNTER — Other Ambulatory Visit: Payer: Self-pay | Admitting: Nurse Practitioner

## 2017-09-29 MED ORDER — ESCITALOPRAM OXALATE 10 MG PO TABS: 10.0000 mg | ORAL_TABLET | Freq: Every day | ORAL | 1 refills | Status: DC

## 2017-09-29 MED ORDER — LOSARTAN POTASSIUM 100 MG PO TABS: 100.0000 mg | ORAL_TABLET | Freq: Every day | ORAL | 1 refills | Status: DC

## 2017-09-29 NOTE — Telephone Encounter (Signed)
Pt. Called requesting refill on losartan, escitalopram [facility].   Pt call back # is  [phone]

## 2017-10-05 ENCOUNTER — Ambulatory Visit: Payer: Medicare Other | Admitting: Internal Medicine

## 2017-10-12 ENCOUNTER — Encounter: Payer: Self-pay | Admitting: Student

## 2017-10-13 ENCOUNTER — Encounter: Payer: Self-pay | Admitting: Emergency Medicine

## 2017-10-13 ENCOUNTER — Other Ambulatory Visit: Payer: Self-pay | Admitting: Internal Medicine

## 2017-10-13 ENCOUNTER — Ambulatory Visit: Payer: Medicare Other | Admitting: Anesthesiology

## 2017-10-13 ENCOUNTER — Ambulatory Visit
Admission: RE | Admit: 2017-10-13 | Discharge: 2017-10-13 | Disposition: A | Discharge status: Home / Self Care | Payer: Medicare Other | Source: Ambulatory Visit | Attending: Internal Medicine | Admitting: Internal Medicine

## 2017-10-13 ENCOUNTER — Encounter
Admission: RE | Disposition: A | Discharge status: Home / Self Care | Payer: Self-pay | Source: Ambulatory Visit | Attending: Internal Medicine

## 2017-10-13 DIAGNOSIS — F329 Major depressive disorder, single episode, unspecified: Secondary | ICD-10-CM | POA: Diagnosis not present

## 2017-10-13 DIAGNOSIS — I739 Peripheral vascular disease, unspecified: Secondary | ICD-10-CM | POA: Insufficient documentation

## 2017-10-13 DIAGNOSIS — K8689 Other specified diseases of pancreas: Secondary | ICD-10-CM | POA: Diagnosis not present

## 2017-10-13 DIAGNOSIS — K862 Cyst of pancreas: Secondary | ICD-10-CM | POA: Diagnosis not present

## 2017-10-13 DIAGNOSIS — K219 Gastro-esophageal reflux disease without esophagitis: Secondary | ICD-10-CM | POA: Diagnosis not present

## 2017-10-13 DIAGNOSIS — Z87891 Personal history of nicotine dependence: Secondary | ICD-10-CM | POA: Diagnosis not present

## 2017-10-13 DIAGNOSIS — I10 Essential (primary) hypertension: Secondary | ICD-10-CM | POA: Insufficient documentation

## 2017-10-13 DIAGNOSIS — G4730 Sleep apnea, unspecified: Secondary | ICD-10-CM | POA: Insufficient documentation

## 2017-10-13 DIAGNOSIS — J449 Chronic obstructive pulmonary disease, unspecified: Secondary | ICD-10-CM | POA: Diagnosis not present

## 2017-10-13 HISTORY — PX: EUS: SHX5427

## 2017-10-13 SURGERY — ULTRASOUND, UPPER GI TRACT, ENDOSCOPIC
Anesthesia: General

## 2017-10-13 MED ORDER — PROPOFOL 500 MG/50ML IV EMUL
INTRAVENOUS | Status: AC
  Filled 2017-10-13: qty 50

## 2017-10-13 MED ORDER — IPRATROPIUM-ALBUTEROL 0.5-2.5 (3) MG/3ML IN SOLN
RESPIRATORY_TRACT | Status: AC
  Administered 2017-10-13: 3 mL via RESPIRATORY_TRACT
  Filled 2017-10-13: qty 3

## 2017-10-13 MED ORDER — FENTANYL CITRATE (PF) 100 MCG/2ML IJ SOLN
INTRAMUSCULAR | Status: AC
  Filled 2017-10-13: qty 2

## 2017-10-13 MED ORDER — CIPROFLOXACIN IN D5W 400 MG/200ML IV SOLN
INTRAVENOUS | Status: DC | PRN
  Administered 2017-10-13: 400 mg via INTRAVENOUS

## 2017-10-13 MED ORDER — FENTANYL CITRATE (PF) 100 MCG/2ML IJ SOLN
INTRAMUSCULAR | Status: DC | PRN
  Administered 2017-10-13: 50 ug via INTRAVENOUS

## 2017-10-13 MED ORDER — MIDAZOLAM HCL 2 MG/2ML IJ SOLN
INTRAMUSCULAR | Status: AC
  Filled 2017-10-13: qty 2

## 2017-10-13 MED ORDER — LIDOCAINE HCL (CARDIAC) 20 MG/ML IV SOLN
INTRAVENOUS | Status: DC | PRN
  Administered 2017-10-13: 30 mg via INTRAVENOUS

## 2017-10-13 MED ORDER — PHENYLEPHRINE HCL 10 MG/ML IJ SOLN
INTRAMUSCULAR | Status: AC
  Filled 2017-10-13: qty 1

## 2017-10-13 MED ORDER — MIDAZOLAM HCL 2 MG/2ML IJ SOLN
INTRAMUSCULAR | Status: DC | PRN
  Administered 2017-10-13: 2 mg via INTRAVENOUS

## 2017-10-13 MED ORDER — IPRATROPIUM-ALBUTEROL 0.5-2.5 (3) MG/3ML IN SOLN
3.0000 mL | Freq: Once | RESPIRATORY_TRACT | Status: AC
  Administered 2017-10-13: 3 mL via RESPIRATORY_TRACT

## 2017-10-13 MED ORDER — SODIUM CHLORIDE 0.9 % IV SOLN
INTRAVENOUS | Status: DC
  Administered 2017-10-13: 1000 mL via INTRAVENOUS

## 2017-10-13 MED ORDER — PHENYLEPHRINE HCL 10 MG/ML IJ SOLN
INTRAMUSCULAR | Status: DC | PRN
  Administered 2017-10-13 (×2): 50 ug via INTRAVENOUS
  Administered 2017-10-13: 100 ug via INTRAVENOUS

## 2017-10-13 MED ORDER — CIPROFLOXACIN IN D5W 400 MG/200ML IV SOLN: 400.0000 mg | Freq: Two times a day (BID) | INTRAVENOUS | Status: DC

## 2017-10-13 MED ORDER — PROPOFOL 500 MG/50ML IV EMUL
INTRAVENOUS | Status: DC | PRN
  Administered 2017-10-13: 120 ug/kg/min via INTRAVENOUS

## 2017-10-13 MED ORDER — CIPROFLOXACIN IN D5W 400 MG/200ML IV SOLN
INTRAVENOUS | Status: AC
  Filled 2017-10-13: qty 200

## 2017-10-13 NOTE — Anesthesia Post-op Follow-up Note (Signed)
Anesthesia QCDR form completed.        

## 2017-10-13 NOTE — Transfer of Care (Signed)
Immediate Anesthesia Transfer of Care Note  Patient: Nathaniel Little  Procedure(s) Performed: FULL UPPER ENDOSCOPIC ULTRASOUND (EUS) RADIAL (N/A )  Patient Location: PACU  Anesthesia Type:General  Level of Consciousness: awake and sedated  Airway & Oxygen Therapy: Patient Spontanous Breathing and Patient connected to face mask oxygen  Post-op Assessment: Report given to RN and Post -op Vital signs reviewed and stable  Post vital signs: Reviewed and stable  Last Vitals:  Vitals:   10/13/17 1229  BP: (!) 154/88  Pulse: 85  Resp: 18  Temp: 36.6 C  SpO2: 99%    Last Pain:  Vitals:   10/13/17 1229  TempSrc: Oral         Complications: No apparent anesthesia complications

## 2017-10-13 NOTE — Op Note (Signed)
[facility] Gastroenterology Patient Name: Nathaniel Little Procedure Date: 10/13/2017 12:57 PM MRN: [id number] Account #: [id number] Date of Birth: Jul 11, 1946 Admit Type: Outpatient Age: 72 Room: [facility] ENDO ROOM 3 Gender: Male Note Status: Finalized Procedure:            Upper EUS Indications:          Pancreatic cyst on MRI Patient Profile:      Refer to note in patient chart for documentation of                        history and physical. Providers:            Murray Hodgkins. [place] Referring MD:         Jonathon Bellows MD, MD (Referring MD) Medicines:            Propofol per Anesthesia, Cipro 425 mg IV Complications:        No immediate complications. Procedure:            Pre-Anesthesia Assessment:                       Prior to the procedure, a History and Physical was                        performed, and patient medications and allergies were                        reviewed. The patient is competent. The risks and                        benefits of the procedure and the sedation options and                        risks were discussed with the patient. All questions                        were answered and informed consent was obtained.                        Patient identification and proposed procedure were                        verified by the physician, the nurse and the                        anesthesiologist in the pre-procedure area. Mental                        Status Examination: alert and oriented. Airway                        Examination: normal oropharyngeal airway and neck                        mobility. Respiratory Examination: clear to                        auscultation. CV Examination: normal. Prophylactic                        Antibiotics: The patient requires prophylactic  antibiotics. Prior Anticoagulants: The patient has                        taken no previous anticoagulant or antiplatelet agents.     ASA Grade Assessment: III - A patient with severe                        systemic disease. After reviewing the risks and                        benefits, the patient was deemed in satisfactory                        condition to undergo the procedure. The anesthesia plan                        was to use monitored anesthesia care (MAC). Immediately                        prior to administration of medications, the patient was                        re-assessed for adequacy to receive sedatives. The                        heart rate, respiratory rate, oxygen saturations, blood                        pressure, adequacy of pulmonary ventilation, and                        response to care were monitored throughout the                        procedure. The physical status of the patient was                        re-assessed after the procedure.                       After obtaining informed consent, the endoscope was                        passed under direct vision. Throughout the procedure,                        the patient's blood pressure, pulse, and oxygen                        saturations were monitored continuously. The Endoscope                        was introduced through the mouth, and advanced to the                        duodenum for ultrasound examination from the esophagus,                        stomach and duodenum. The upper EUS was accomplished  without difficulty. The patient tolerated the procedure                        well. Findings:      Endoscopic Finding :      The examined esophagus was endoscopically normal.      The entire examined stomach was endoscopically normal.      The examined duodenum was endoscopically normal.      Endosonographic Finding :      An anechoic, multicystic lesion was identified in the pancreatic head.       The lesion measured 33.5 mm by 27.8 mm in maximal cross-sectional       diameter. There were many  compartments without septae. The outer wall of       the lesion was not seen. There was no associated mass. There was no       internal debris within the fluid-filled cavity. Diagnostic needle       aspiration for fluid was performed. Color Doppler imaging was utilized       prior to needle puncture to confirm a lack of significant vascular       structures within the needle path. Two passes were made with the 22       gauge [employer] fine aspiration needle using a transduodenal       approach. A stylet was used. The amount of fluid collected was 1 mL. The       fluid was clear and viscous. Sample(s) were sent for cytology and CEA.      Endosonographic imaging of the pancreas showed sonographic changes       indicative of moderate chronic pancreatitis in the entire pancreas. The       parenchyma had calcifications, hyperechoic strands, hyperechoic foci and       honeycomb lobularity. The pancreatic duct had duct dilation (4.4 mm in       the neck, 3.8 mm in the body, and 3.6 mm in the tail) and intraductal       stones.      There was no sign of significant endosonographic abnormality in the       common bile duct and in the common hepatic duct.      Endosonographic imaging in the left lobe of the liver showed no       abnormalities.      No lymphadenopathy seen.      The celiac region was visualized and showed no sign of significant       endosonographic abnormality. Impression:           EGD Impressions:                       - Normal esophagus.                       - Normal stomach.                       - Normal examined duodenum.                       EUS Impressions:                       - A multicystic lesion was seen in the pancreatic head.  The endosonographic appearance is suspicious for a                        branched intraductal papillary mucinous neoplasm. Fine                        needle aspiration for cytology and cyst fluid CEA                         performed (unable to aspirate enough fluid for amylase                        evaluation).                       - Endosonographic imaging of the pancreas showed                        sonographic changes consistent with moderate chronic                        pancreatitis.                       - There was no sign of significant pathology in the                        common bile duct and in the common hepatic duct.                       - Normal visualized portions of the liver.                       - No lymphadenopathy seen.                       - Normal celiac region. Recommendation:       - Discharge patient to home (ambulatory).                       - Await cytology results.                       - Cipro (ciprofloxacin) 500 mg PO BID for 5 days.                       - Consider referral to a surgeon if an IPMN is                        suggested on cytology and cyst fluid CEA.                       - The findings and recommendations were discussed with                        the patient and his family.                       - Return to referring GI physician, Dr. Vicente Males, as                        previously scheduled. Further plan of  care to be                        determined by Dr. Vicente Males. Procedure Code(s):    --- Professional ---                       [phone], Esophagogastroduodenoscopy, flexible, transoral;                        with transendoscopic ultrasound-guided intramural or                        transmural fine needle aspiration/biopsy(s) (includes                        endoscopic ultrasound examination of the esophagus,                        stomach, and either the duodenum or a surgically                        altered stomach where the jejunum is examined distal to                        the anastomosis) Diagnosis Code(s):    --- Professional ---                       Q68.3, Cyst of pancreas                       R93.3, Abnormal findings on  diagnostic imaging of other                        parts of digestive tract CPT copyright 2016 American Medical Association. All rights reserved. The codes documented in this report are preliminary and upon coder review may  be revised to meet current compliance requirements. Attending Participation:      I personally performed the entire procedure without the assistance of a       fellow, resident or surgical assistant. [place],  10/13/2017 1:48:34 PM This report has been signed electronically. Number of Addenda: 0 Note Initiated On: 10/13/2017 12:57 PM      [facility]

## 2017-10-13 NOTE — Anesthesia Preprocedure Evaluation (Signed)
Anesthesia Evaluation  Patient identified by MRN, date of birth, ID band Patient awake    Reviewed: Allergy & Precautions, H&P , NPO status , Patient's Chart, lab work & pertinent test results, reviewed documented beta blocker date and time   Airway Mallampati: II   Neck ROM: full    Dental  (+) Poor Dentition, Teeth Intact   Pulmonary neg pulmonary ROS, sleep apnea and Continuous Positive Airway Pressure Ventilation , COPD, former smoker,    Pulmonary exam normal        Cardiovascular Exercise Tolerance: Poor hypertension, + Peripheral Vascular Disease  negative cardio ROS Normal cardiovascular exam+ Valvular Problems/Murmurs  Rhythm:regular Rate:Normal     Neuro/Psych PSYCHIATRIC DISORDERS negative neurological ROS  negative psych ROS   GI/Hepatic negative GI ROS, Neg liver ROS, GERD  Medicated,  Endo/Other  negative endocrine ROS  Renal/GU negative Renal ROS  negative genitourinary   Musculoskeletal   Abdominal   Peds  Hematology negative hematology ROS (+)   Anesthesia Other Findings Past Medical History: No date: Arthritis 03/2013: Cancer ([place])     Comment:  Prostate No date: COPD (chronic obstructive pulmonary disease) (HCC) No date: Depression No date: Heart disease No date: Heart murmur No date: Hypertension No date: Lymphedema No date: Sleep apnea with use of continuous positive airway pressure  (CPAP) Past Surgical History: No date: ADENOIDECTOMY No date: APPENDECTOMY No date: TONSILLECTOMY BMI    Body Mass Index:  40.61 kg/m     Reproductive/Obstetrics negative OB ROS                             Anesthesia Physical Anesthesia Plan  ASA: III  Anesthesia Plan: General   Post-op Pain Management:    Induction:   PONV Risk Score and Plan:   Airway Management Planned:   Additional Equipment:   Intra-op Plan:   Post-operative Plan:   Informed Consent: I  have reviewed the patients History and Physical, chart, labs and discussed the procedure including the risks, benefits and alternatives for the proposed anesthesia with the patient or authorized representative who has indicated his/her understanding and acceptance.   Dental Advisory Given  Plan Discussed with: CRNA  Anesthesia Plan Comments:         Anesthesia Quick Evaluation

## 2017-10-13 NOTE — Anesthesia Procedure Notes (Signed)
Performed by: Cook-Martin, Eldridge Marcott Pre-anesthesia Checklist: Patient identified, Emergency Drugs available, Suction available, Patient being monitored and Timeout performed Patient Re-evaluated:Patient Re-evaluated prior to induction Oxygen Delivery Method: Simple face mask Preoxygenation: Pre-oxygenation with 100% oxygen Induction Type: IV induction Airway Equipment and Method: Bite block Placement Confirmation: positive ETCO2 and CO2 detector       

## 2017-10-13 NOTE — H&P (Signed)
I have reviewed the most recent GI consult note H&P from Dr. Vicente Males.  There are no changes and no contraindications to proceeding with an EUS at this time.

## 2017-10-14 LAB — MISC LABCORP TEST (SEND OUT): LABCORP TEST CODE: 142331

## 2017-10-16 NOTE — Anesthesia Postprocedure Evaluation (Signed)
Anesthesia Post Note  Patient: Joshuah Minella  Procedure(s) Performed: FULL UPPER ENDOSCOPIC ULTRASOUND (EUS) RADIAL (N/A )  Patient location during evaluation: PACU Anesthesia Type: General Level of consciousness: awake and alert Pain management: pain level controlled Vital Signs Assessment: post-procedure vital signs reviewed and stable Respiratory status: spontaneous breathing, nonlabored ventilation, respiratory function stable and patient connected to nasal cannula oxygen Cardiovascular status: blood pressure returned to baseline and stable Postop Assessment: no apparent nausea or vomiting Anesthetic complications: no     Last Vitals:  Vitals:   10/13/17 1416 10/13/17 1427  BP: 113/71 129/79  Pulse: 77 73  Resp: 15 20  Temp:    SpO2: 97% 97%    Last Pain:  Vitals:   10/14/17 1019  TempSrc:   PainSc: 0-No pain                 Molli Barrows

## 2017-10-17 ENCOUNTER — Encounter: Payer: Self-pay | Admitting: Internal Medicine

## 2017-10-19 LAB — CYTOLOGY - NON PAP

## 2017-10-20 ENCOUNTER — Telehealth: Payer: Self-pay | Admitting: Gastroenterology

## 2017-10-20 NOTE — Telephone Encounter (Signed)
Please call patient with results.

## 2017-10-20 NOTE — Telephone Encounter (Signed)
Advised patient of message from Dr. Vicente Males.    - test results were inconclusive.  - Dr. Vicente Males will contact Dr. Mont Dutton concerning next steps on 1/18. - Dr. Vicente Males will call patient in a few days with the plan of care.

## 2017-10-20 NOTE — Progress Notes (Signed)
EUS Cytology/CEA routed to Dr. Vicente Males. Oncology Nurse Navigator Documentation  Navigator Location: [facility] (10/20/17 0800)   )Navigator Encounter Type: Letter/Fax/Email;Diagnostic Results (10/20/17 0800)                                                    Time Spent with Patient: 15 (10/20/17 0800)

## 2017-10-26 ENCOUNTER — Ambulatory Visit: Payer: Medicare Other | Admitting: Nurse Practitioner

## 2017-10-26 ENCOUNTER — Telehealth: Payer: Self-pay

## 2017-10-26 NOTE — Telephone Encounter (Signed)
Advised Mr. Duran that Dr. Vicente Males contacted Dr. Mont Dutton concerning next steps.

## 2017-10-27 ENCOUNTER — Ambulatory Visit (INDEPENDENT_AMBULATORY_CARE_PROVIDER_SITE_OTHER): Payer: Medicare Other | Admitting: Nurse Practitioner

## 2017-10-27 ENCOUNTER — Other Ambulatory Visit: Payer: Self-pay

## 2017-10-27 ENCOUNTER — Encounter: Payer: Self-pay | Admitting: Nurse Practitioner

## 2017-10-27 VITALS — BP 145/90 | HR 106 | Temp 98.2°F | Ht 69.0 in | Wt 272.2 lb

## 2017-10-27 DIAGNOSIS — I10 Essential (primary) hypertension: Secondary | ICD-10-CM

## 2017-10-27 DIAGNOSIS — Z716 Tobacco abuse counseling: Secondary | ICD-10-CM | POA: Diagnosis not present

## 2017-10-27 DIAGNOSIS — K5901 Slow transit constipation: Secondary | ICD-10-CM

## 2017-10-27 DIAGNOSIS — J431 Panlobular emphysema: Secondary | ICD-10-CM | POA: Diagnosis not present

## 2017-10-27 DIAGNOSIS — F17210 Nicotine dependence, cigarettes, uncomplicated: Secondary | ICD-10-CM | POA: Diagnosis not present

## 2017-10-27 MED ORDER — BUDESONIDE-FORMOTEROL FUMARATE 80-4.5 MCG/ACT IN AERO: 2.0000 | INHALATION_SPRAY | Freq: Two times a day (BID) | RESPIRATORY_TRACT | 3 refills | Status: DC

## 2017-10-27 MED ORDER — BUPROPION HCL ER (SR) 150 MG PO TB12: 150.0000 mg | ORAL_TABLET | Freq: Two times a day (BID) | ORAL | 1 refills | Status: DC

## 2017-10-27 MED ORDER — ALBUTEROL SULFATE HFA 108 (90 BASE) MCG/ACT IN AERS: 2.0000 | INHALATION_SPRAY | Freq: Four times a day (QID) | RESPIRATORY_TRACT | 0 refills | Status: DC | PRN

## 2017-10-27 MED ORDER — POLYETHYLENE GLYCOL 3350 17 G PO PACK
17.0000 g | PACK | Freq: Every day | ORAL | 0 refills | Status: DC
Start: 2017-10-27 — End: 2018-03-20

## 2017-10-27 MED ORDER — HYDROCHLOROTHIAZIDE 12.5 MG PO TABS: 12.5000 mg | ORAL_TABLET | Freq: Every day | ORAL | 3 refills | Status: DC

## 2017-10-27 NOTE — Progress Notes (Signed)
Subjective:    Patient ID: Nathaniel Little, male    DOB: 1946-03-05, 72 y.o.   MRN: [id number]  Author Hatlestad is a 72 y.o. male presenting on 10/27/2017 for Depression  Transportation difficulty ACTA: must schedule rides 1 mo in advance.  HPI Depression Had worsening depression when he found out about his tests, but now that he has his result and has done the procedure once, things are improved.  Increased worry about the unknown.  Depression screen [facility] 2/9 10/27/2017 04/20/2017 01/06/2017 01/06/2017  Decreased Interest 0 3 0 0  Down, Depressed, Hopeless 1 0 2 1  PHQ - 2 Score 1 3 2 1   Altered sleeping 3 1 1  -  Tired, decreased energy 2 0 2 -  Change in appetite 0 0 0 -  Feeling bad or failure about yourself  0 0 0 -  Trouble concentrating 0 0 0 -  Moving slowly or fidgety/restless 0 0 0 -  Suicidal thoughts 0 0 0 -  PHQ-9 Score 6 4 5  -  Difficult doing work/chores Not difficult at all - Not difficult at all -     COPD Still having trouble breathing.  Worst when he bends over.  When he walks down stairs then to mailbox about 50 feet he does get short of breath, resolves after 5-10 minutes.   - He notes albuterol helps significantly, but he has not had any in last several months.   - Is taking no maintenance inhalers. - Uses nebulizer only once per mo, but had to use today. - Uses CPAP nightly.  Gas: Feels very bloated.  Reports GI bleeding about every other day in stool.  Sometimes a couple spots.  Turns water in toilet red.  Only notices when he wipes.  Started after biopsy.  Has not looked at his stool and is not able to quantify how much/where blood is.  Streaksvs total stool.  Hypertension - He is not checking BP at home or outside of clinic.   - Current medications: clonidine 0.1 mg twice daily, losartan 100 mg once daily tolerating well without side effects - He is not currently symptomatic. - Pt denies headache, lightheadedness, dizziness, changes in vision, chest  tightness/pressure, palpitations, leg swelling, sudden loss of speech or loss of consciousness. - He  reports no regular exercise routine. - His diet is high in salt, high in fat, and high in carbohydrates.  Smoking Cessation Pt still smoking, but only 1 cigarette daily.  Smokes it right after eating lunch.   Social History   Tobacco Use  . Smoking status: Current Every Day Smoker    Packs/day: 0.25    Years: 68.00    Pack years: 17.00    Types: Cigarettes    Last attempt to quit: 04/01/2017    Years since quitting: 0.6  . Smokeless tobacco: Former Systems developer    Types: Chew, [place] date: 05/16/2016  . Tobacco comment: 1 Cigarettes a day   Substance Use Topics  . Alcohol use: Yes    Alcohol/week: 0.6 oz    Types: 1 Cans of beer per week    Comment: occ  . Drug use: No    Review of Systems Per HPI unless specifically indicated above     Objective:    BP (!) 145/90 (BP Location: Right Arm, Patient Position: Sitting, Cuff Size: Large)   Pulse (!) 106   Temp 98.2 F (36.8 C) (Oral)   Ht 5\' 9"  (1.753 m)  Wt 272 lb 3.2 oz (123.5 kg)   SpO2 95%   BMI 40.20 kg/m   Wt Readings from Last 3 Encounters:  11/22/17 269 lb 8 oz (122.2 kg)  10/27/17 272 lb 3.2 oz (123.5 kg)  10/13/17 275 lb (124.7 kg)    Physical Exam  General - obese with central adiposity, well-appearing, NAD HEENT - Normocephalic, atraumatic, PERRL, EOMI, patent nares w/o congestion, oropharynx clear, MMM Neck - supple, non-tender, no LAD, no thyromegaly, no carotid bruit Heart - RRR, no murmurs heard Lungs - Diminished air movement throughout all lobes, with mild wheezing, crackles, and rhonchi. Mildly increased  work of breathing, No accessory muscle use. Abdomen - soft, NTND, no masses, no hepatosplenomegaly, active bowel sounds Extremeties - non-tender, no edema, cap refill < 2 seconds, peripheral pulses intact +2 bilaterally Skin - warm, dry, no rashes Neuro - awake, alert, oriented x3, antalgic  gait Psych - Normal mood and affect, normal behavior      Assessment & Plan:   Problem List Items Addressed This Visit      Cardiovascular and Mediastinum   Hypertension Persistently unontrolled hypertension.  BP at goal.  Pt is no longer working on lifestyle modifications and has frequent dietary indiscretions.  Taking medications tolerating well without side effects. No currently identified HTN complications.  Plan: 1. Continue clonidine and losartan - START hydrochlorothiazide 12.5 mg once daily 2. Last labs WNL for kidney function 3. Encouraged heart healthy diet and increasing exercise to 30 minutes most days of the week. 4. Check BP 1-2 x per week at home, keep log, and bring to clinic at next appointment. 5. Follow up 3 months.     Relevant Medications   hydrochlorothiazide (HYDRODIURIL) 12.5 MG tablet     Respiratory   COPD (chronic obstructive pulmonary disease) ([place]) - Primary Pt with worsening lung symptoms and worsening wheezing and shortness of breath that is preventing pt from increasing physical activity.  High utilization of albuterol at this time.  Plan: 1. Continue Spiriva 2. START symbicort 2 puffs twice daily 3. Continue albuterol, but use only as needed. 4. Consider PFT/pulmonology referral if symptoms continue to worsen. 5. Followup 3 months.   Relevant Medications   budesonide-formoterol (SYMBICORT) 80-4.5 MCG/ACT inhaler   albuterol (PROVENTIL HFA;VENTOLIN HFA) 108 (90 Base) MCG/ACT inhaler    Other Visit Diagnoses    Encounter for smoking cessation counseling     Discussed need to continue smoking cessation treatment.  Pt still smoking 1 cigarette daily and only after lunch.  Discussed reason pt still has craving/desire to smoke that cigarette.  Pt notes is a finality of the meal.    Plan: 1. Continue bupropion 150 mg bid for depression and smoking cessation. 2. Encouraged pt to find alternative finish to his lunch meal. 3. Followup 3  months.  Discussion today >5 minutes (<10 minutes) specifically on counseling on risks of tobacco use, complications, treatment, smoking cessation.   Relevant Medications   buPROPion (WELLBUTRIN SR) 150 MG 12 hr tablet   Slow transit constipation     Pt with constipation and gas/bloating.  Pt is noting some abdominal pain with gas movement.  Plan: 1. Use OTC beano or Gas X 2. Continue using Miralax daily to prevent constipation and promote motility. 3. Pt has reported blood in stool, so requested pt to return to see Dr. Vicente Males.  Bleeding can cause worsening abdominal pain. 4. Followup 3 months or sooner if needed.   Relevant Medications   polyethylene glycol (MIRALAX /  GLYCOLAX) packet      Meds ordered this encounter  Medications  . buPROPion (WELLBUTRIN SR) 150 MG 12 hr tablet    Sig: Take 1 tablet (150 mg total) by mouth 2 (two) times daily.    Dispense:  180 tablet    Refill:  1    Order Specific Question:   Supervising Provider    Answer:   Olin Hauser [[id number]]  . budesonide-formoterol (SYMBICORT) 80-4.5 MCG/ACT inhaler    Sig: Inhale 2 puffs into the lungs 2 (two) times daily.    Dispense:  1 Inhaler    Refill:  3    Order Specific Question:   Supervising Provider    Answer:   Olin Hauser [[id number]]  . DISCONTD: albuterol (PROVENTIL HFA;VENTOLIN HFA) 108 (90 Base) MCG/ACT inhaler    Sig: Inhale 2 puffs into the lungs every 6 (six) hours as needed for wheezing or shortness of breath.    Dispense:  1 Inhaler    Refill:  0    Order Specific Question:   Supervising Provider    Answer:   Olin Hauser [[id number]]  . polyethylene glycol (MIRALAX / GLYCOLAX) packet    Sig: Take 17 g by mouth daily.    Dispense:  14 each    Refill:  0    Order Specific Question:   Supervising Provider    Answer:   Olin Hauser [[id number]]  . hydrochlorothiazide (HYDRODIURIL) 12.5 MG tablet    Sig: Take 1 tablet (12.5 mg total) by mouth daily.    Dispense:  90  tablet    Refill:  3    Order Specific Question:   Supervising Provider    Answer:   Olin Hauser [[id number]]  . albuterol (PROVENTIL HFA;VENTOLIN HFA) 108 (90 Base) MCG/ACT inhaler    Sig: Inhale 2 puffs into the lungs every 6 (six) hours as needed for wheezing or shortness of breath.    Dispense:  1 Inhaler    Refill:  0    Order Specific Question:   Supervising Provider    Answer:   Olin Hauser [[id number]]    Follow up plan: Return in about 3 months (around 01/25/2018) for Hypertension, COPD.   Cassell Smiles, DNP, AGPCNP-BC Adult Gerontology Primary Care Nurse Practitioner [facility] 11/23/2017, 4:11 PM

## 2017-10-27 NOTE — Patient Instructions (Addendum)
Dylin, Thank you for coming in to clinic today.  1. You may use over the counter gas relief pills as needed for your increased gas pain. - You can try beano or Gas-X - May continue miralax for constipation once daily. - I am also requesting you go back to see Dr. Vicente Males for your blood in your stool.  Bleeding can also cause these abdominal symptoms.  2. For your COPD: - START walking to your mailbox twice daily for exercise.  Do this every day. - Continue spiriva - Continue albuterol 1-2 puffs as needed for shortness of breath.  This is your rescue inhaler. - START symbicort 2 puffs twice daily for maintenance medication.  3. For your blood pressure: - YOU MUST reduce your salt intake.  Avoid all processed foods and processed meats. - START hydrochlorothiazide 12.5 mg once daily. - Continue clonidine 0.1 mg twice daily per Dr. Rockey Situ. - Continue losartan 100 mg once daily.    Please schedule a follow-up appointment with Cassell Smiles, AGNP. Return in about 3 months (around 01/25/2018) for Hypertension, COPD.  If you have any other questions or concerns, please feel free to call the clinic or send a message through [facility]. You may also schedule an earlier appointment if necessary.  You will receive a survey after today's visit either digitally by e-mail or paper by [name]. Your experiences and feedback matter to [name].  Please respond so we know how we are doing as we provide care for you.   Cassell Smiles, DNP, AGNP-BC Adult Gerontology Nurse Practitioner [place]

## 2017-11-02 ENCOUNTER — Other Ambulatory Visit: Payer: Self-pay

## 2017-11-14 ENCOUNTER — Telehealth: Payer: Self-pay

## 2017-11-14 ENCOUNTER — Other Ambulatory Visit: Payer: Self-pay | Admitting: Nurse Practitioner

## 2017-11-14 NOTE — Telephone Encounter (Signed)
-----   Message from Jonathon Bellows, MD sent at 11/13/2017  5:48 PM EST ----- Nathaniel Little  As we discussed on Friday - can be referred to the surgeons mentioned below   [name]  ----- Message ----- From: Sindy Guadeloupe, MD Sent: 11/11/2017   4:01 PM To: Clent Jacks, RN, Jonathon Bellows, MD  Hello,  Patients case discussed at MDT on 11/10/17. maging and pathology reviewed. Given pancreatic duct dilatation, elevated CEA on FNA and atypical cells seen on pathology- this could very well be IPMN. [facility] Dr. Cephas Darby did not think repeat EUS in few months would be helpful. He recommends surgical consultation for this patient.   Patient has transportation issues and may need to go to [facility] for this or [facility] Dr. Donnal Moat or Dr. Mariah Milling if he is agreeable  Thanks, Astrid Divine

## 2017-11-14 NOTE — Telephone Encounter (Signed)
Advised patient that Dr. Vicente Males would like to discuss his case in person.   Requested an office visit today or tomorrow.   Patient is contacting his transportation people to see when he can schedule his office visit.   Waiting for callback from patient.

## 2017-11-17 ENCOUNTER — Ambulatory Visit: Payer: Medicare Other | Admitting: Gastroenterology

## 2017-11-17 DIAGNOSIS — K869 Disease of pancreas, unspecified: Secondary | ICD-10-CM | POA: Diagnosis not present

## 2017-11-17 NOTE — Progress Notes (Signed)
Jonathon Bellows MD, MRCP(U.K) [address]  [place]  [place], [place] [address]  Main: [phone]  Fax: [phone]   Primary Care Physician: [name], NP  Primary Gastroenterologist:  Dr. Jonathon Bellows   No chief complaint on file.   HPI: Nathaniel Little is a 72 y.o. male    Summary of history :   Nathaniel Little is here to follow up for  Pancreatic lesion ,He  underwent a CT scan of the abdomen recently that showed an abnormal appearance of the pancreas with several large calculi along the dorsal pancreatic duct, prominent dilated segment of the dorsal pancreatic duct in the pancreatic body and a cystic mass along the upper margin of the pancreatic body measuring up to 3.6 cm with internal septations.  Concern was for cystic serous neoplasm.  An MRI of the abdomen showed a multiseptated cystic lesions in the pancreatic head and body similar to features seen on recent CT scan.  Most likely feel a cystic neoplasm or I PMNs.  No worrisome MR imaging features.  The MRI suggested further EUS evaluation with a short-term follow-up.  CMP showed no abnormalities of his transaminases, albumin was 4.1, total bilirubin 0.7 hemoglobin of 15.4.Recent cardiac stress test showed no significant ischemia.  Denies any weight loss, no family history or personal history of pancreatitis or pancreatic cancer. Denies any abdominal pain . He is a smoker.  Interval history   09/08/2017-  11/17/2016   Underwent EUS 10/13/17 : FNA of lesion of the pancreas CEA 1166, PANCREATIC HEAD CYST; EUS-GUIDED FINE-NEEDLE ASPIRATION:  ATYPICAL/INCONCLUSIVE.  MUCINOUS EPITHELIUM WITH FOCAL ATYPIA   His results were discussed by Dr Janese Banks at the cancer MDT meeting and consensus was that he be referred for surgical evaluation to [facility].     Current Outpatient Medications  Medication Sig Dispense Refill  . albuterol (PROVENTIL HFA;VENTOLIN HFA) 108 (90 Base) MCG/ACT inhaler Inhale 2 puffs into the lungs every 6 (six)  hours as needed for wheezing or shortness of breath. 1 Inhaler 0  . aspirin 81 MG tablet Take 81 mg by mouth daily.    . budesonide-formoterol (SYMBICORT) 80-4.5 MCG/ACT inhaler Inhale 2 puffs into the lungs 2 (two) times daily. 1 Inhaler 3  . buPROPion (WELLBUTRIN SR) 150 MG 12 hr tablet Take 1 tablet (150 mg total) by mouth 2 (two) times daily. 180 tablet 1  . cloNIDine (CATAPRES) 0.1 MG tablet Take 1 tablet (0.1 mg total) 2 (two) times daily by mouth. 60 tablet 11  . escitalopram (LEXAPRO) 10 MG tablet TAKE 1 TABLET BY MOUTH ONCE DAILY 30 tablet 2  . hydrochlorothiazide (HYDRODIURIL) 12.5 MG tablet Take 1 tablet (12.5 mg total) by mouth daily. 90 tablet 3  . ipratropium-albuterol (DUONEB) 0.5-2.5 (3) MG/3ML SOLN Take 3 mLs by nebulization every 6 (six) hours as needed (wheezing, shortness of breath). 360 mL 3  . losartan (COZAAR) 100 MG tablet TAKE 1 TABLET BY MOUTH ONCE DAILY 30 tablet 2  . omeprazole (PRILOSEC) 40 MG capsule Take 40 mg by mouth 3 times/day as needed-between meals & bedtime.    . pantoprazole (PROTONIX) 40 MG tablet Take 40 mg daily by mouth.    . polyethylene glycol (MIRALAX / GLYCOLAX) packet Take 17 g by mouth daily. 14 each 0  . tiotropium (SPIRIVA HANDIHALER) 18 MCG inhalation capsule Place 1 capsule (18 mcg total) into inhaler and inhale daily. 30 capsule 5   No current facility-administered medications for this visit.     Allergies as  of 11/17/2017 - Review Complete 10/27/2017  Allergen Reaction Noted  . Codeine Itching 05/23/2016    ROS:  General: Negative for anorexia, weight loss, fever, chills, fatigue, weakness. ENT: Negative for hoarseness, difficulty swallowing , nasal congestion. CV: Negative for chest pain, angina, palpitations, dyspnea on exertion, peripheral edema.  Respiratory: Negative for dyspnea at rest, dyspnea on exertion, cough, sputum, wheezing.  GI: See history of present illness. GU:  Negative for dysuria, hematuria, urinary incontinence,  urinary frequency, nocturnal urination.  Endo: Negative for unusual weight change.    Physical Examination:   There were no vitals taken for this visit.  General: Well-nourished, well-developed in no acute distress.  Eyes: No icterus. Conjunctivae pink. Mouth: Oropharyngeal mucosa moist and pink , no lesions erythema or exudate. Lungs: Clear to auscultation bilaterally. Non-labored. Heart: Regular rate and rhythm, no murmurs rubs or gallops.  Abdomen: Bowel sounds are normal, nontender, nondistended, no hepatosplenomegaly or masses, no abdominal bruits or hernia , no rebound or guarding.   Extremities: No lower extremity edema. No clubbing or deformities. Neuro: Alert and oriented x 3.  Grossly intact. Skin: Warm and dry, no jaundice.   Psych: Alert and cooperative, normal mood and affect.   Imaging Studies: No results found.  Assessment and Plan:   Nathaniel Little is a 72 y.o. y/o male here to follow up for a cystic septated lesion seen recently on CT/MRI,FNA highly suggestive of FNA with atypical cells . Discussed at [facility] disciplinary meeting and decision was to refer for surgery -possible whipples.   Plan  1. Refer to [facility] surgery for possible Whipples resection.    Dr Jonathon Bellows  MD,MRCP Nathaniel Little

## 2017-11-18 ENCOUNTER — Telehealth: Payer: Self-pay

## 2017-11-18 NOTE — Telephone Encounter (Signed)
Faxed referral to [facility] concerning IPMN.   Dr. Donnal Moat or Dr. Mariah Milling requested.   [phone]  [phone] - fax)

## 2017-11-19 NOTE — Progress Notes (Signed)
Cardiology Office Note  Date:  11/22/2017   ID:  Ayham Word, DOB 1945/12/14, MRN [id number]  PCP:  [name], NP   Chief Complaint  Patient presents with  . other    46mo f/u post echo/myoview. Pt c/o cp at times; denies sob. States he will be meeting with specialist r/t possible surgery on pancreas. Reviewed meds with pt verbally.    HPI:  Nathaniel Little is a 72 y.o. male  Smoker, COPD Prior significant alcohol intake.  OSA, on CPAP HTN Murmur Prostate cancer 07/25/2017 emergency department via EMS with 1 day of progressive worsening shortness of breath.   increasing productive cough and shortness of breath  Constipated CT ABD: Pancreatic findings :chronic pancreatitis and calcifications, possible pancreatic mass Who presents for follow up of his aortic valve stenosis  Recent echocardiogram results reviewed with him Echo 08/23/2017 EF 60% Aortic valve:  moderate stenosis,  Mean gradient (S): 26 mm Hg.  Peak gradient (S): 53 mm Hg.peak velocity 363 cm/s  Reports he is asymptomatic Eating once a day on purpose to lose weight Down 6 pounds in a one month  Continues to smoke, previously  3 packs/day Down to one cig a day  Stress test from 08/2017 results discussed with him in detail Pharmacological myocardial perfusion imaging study with no significant  ischemia Normal wall motion, EF estimated at 53% No EKG changes concerning for ischemia at peak stress or in recovery. Low risk scan  Denies having significant chest pain Previously having fleeting episodes of chest pain,   Chronic leg pain,  sometimes when he sleeps, sometimes in the day.    EKG personally reviewed by myself on todays visit Shows normal sinus rhythm rate 85 bpm old inferior MI, unable to exclude old anterior MI    PMH:   has a past medical history of Arthritis, Cancer ([place]) (03/2013), COPD (chronic obstructive pulmonary disease) ([place]), Depression, Heart disease, Heart murmur,  Hypertension, Lymphedema, and Sleep apnea with use of continuous positive airway pressure (CPAP).  PSH:    Past Surgical History:  Procedure Laterality Date  . ADENOIDECTOMY    . APPENDECTOMY    . EUS N/A 10/13/2017   Procedure: FULL UPPER ENDOSCOPIC ULTRASOUND (EUS) RADIAL;  Surgeon: Holly Bodily, MD;  Location[facility] END[facility]COPY;  Service: Gastroenterology;  Laterality: N/A;  . TONSILLECTOMY      Current Outpatient Medications  Medication Sig Dispense Refill  . albuterol (PROVENTIL HFA;VENTOLIN HFA) 108 (90 Base) MCG/ACT inhaler Inhale 2 puffs into the lungs every 6 (six) hours as needed for wheezing or shortness of breath. 1 Inhaler 0  . aspirin 81 MG tablet Take 81 mg by mouth daily.    . budesonide-formoterol (SYMBICORT) 80-4.5 MCG/ACT inhaler Inhale 2 puffs into the lungs 2 (two) times daily. 1 Inhaler 3  . buPROPion (WELLBUTRIN SR) 150 MG 12 hr tablet Take 1 tablet (150 mg total) by mouth 2 (two) times daily. 180 tablet 1  . cloNIDine (CATAPRES) 0.1 MG tablet Take 1 tablet (0.1 mg total) 2 (two) times daily by mouth. 60 tablet 11  . escitalopram (LEXAPRO) 10 MG tablet TAKE 1 TABLET BY MOUTH ONCE DAILY 30 tablet 2  . hydrochlorothiazide (HYDRODIURIL) 12.5 MG tablet Take 1 tablet (12.5 mg total) by mouth daily. 90 tablet 3  . ipratropium-albuterol (DUONEB) 0.5-2.5 (3) MG/3ML SOLN Take 3 mLs by nebulization every 6 (six) hours as needed (wheezing, shortness of breath). 360 mL 3  . losartan (COZAAR) 100 MG tablet TAKE 1 TABLET BY MOUTH  ONCE DAILY 30 tablet 2  . omeprazole (PRILOSEC) 40 MG capsule Take 40 mg by mouth 3 times/day as needed-between meals & bedtime.    . pantoprazole (PROTONIX) 40 MG tablet Take 40 mg daily by mouth.    . polyethylene glycol (MIRALAX / GLYCOLAX) packet Take 17 g by mouth daily. 14 each 0  . tiotropium (SPIRIVA HANDIHALER) 18 MCG inhalation capsule Place 1 capsule (18 mcg total) into inhaler and inhale daily. 30 capsule 5   No current  facility-administered medications for this visit.      Allergies:   Codeine   Social History:  The patient  reports that he has been smoking cigarettes.  He has a 17.00 pack-year smoking history. He quit smokeless tobacco use about 18 months ago. His smokeless tobacco use included chew and snuff. He reports that he drinks about 0.6 oz of alcohol per week. He reports that he does not use drugs.   Family History:   family history includes Breast cancer in his mother; Cancer in his father; Cerebral palsy in his brother; Tuberculosis in his paternal uncle.    Review of Systems: Review of Systems  Constitutional: Negative.   Respiratory: Positive for shortness of breath.   Cardiovascular: Negative.   Gastrointestinal: Negative.   Musculoskeletal: Negative.        Leg pain on exertion and at rest  Neurological: Negative.   Psychiatric/Behavioral: Negative.   All other systems reviewed and are negative.    PHYSICAL EXAM: VS:  BP 136/90 (BP Location: Left Arm, Patient Position: Sitting, Cuff Size: Large)   Pulse 85   Ht 5\' 9"  (1.753 m)   Wt 269 lb 8 oz (122.2 kg)   BMI 39.80 kg/m  , BMI Body mass index is 39.8 kg/m. Constitutional:  oriented to person, place, and time. No distress.  HENT:  Head: Normocephalic and atraumatic.  Eyes:  no discharge. No scleral icterus.  Neck: Normal range of motion. Neck supple. No JVD present.  Cardiovascular: Normal rate, regular rhythm, normal heart sounds and intact distal pulses. Exam reveals no gallop and no friction rub. No edema No murmur heard. Pulmonary/Chest: Effort normal and breath sounds normal. No stridor. No respiratory distress.  no wheezes.  no rales.  no tenderness.  Abdominal: Soft.  no distension.  no tenderness.  Musculoskeletal: Normal range of motion.  no  tenderness or deformity.  Neurological:  normal muscle tone. Coordination normal. No atrophy Skin: Skin is warm and dry. No rash noted. not diaphoretic.  Psychiatric:   normal mood and affect. behavior is normal. Thought content normal.      Recent Labs: 01/06/2017: TSH 1.22 07/25/2017: B Natriuretic Peptide 40.0 08/04/2017: ALT 45; BUN 23; Creatinine, Ser 1.09; Hemoglobin 15.4; Platelets 218; Potassium 3.3; Sodium 135    Lipid Panel No results found for: CHOL, HDL, LDLCALC, TRIG    Wt Readings from Last 3 Encounters:  11/22/17 269 lb 8 oz (122.2 kg)  10/27/17 272 lb 3.2 oz (123.5 kg)  10/13/17 275 lb (124.7 kg)       ASSESSMENT AND PLAN:  Aortic atherosclerosis ([place]) - Plan: EKG 12-Lead Moderate aortic atherosclerosis noted extending into the common iliac vessels Stressed the need for smoking cessation Goal LDL less than 70 We will suggest he start a statin.  Baseline lipid panel not available  Essential hypertension - Plan: EKG 12-Lead Blood pressure relatively well controlled today No medication changes made Tolerating clonidine 0.1 mg twice daily in addition to his losartan We will avoid calcium  channel blocker given mild lower extremity edema Will avoid diuretics given chronic renal insufficiency and low potassium The other options include hydralazine, nitrates, Cardura  Chronic obstructive pulmonary disease, unspecified COPD type ([place]) - Plan: EKG 12-Lead On 3 inhalers, recommended smoking cessation Previously smoked 4 packs/day Now down to 1 cigarette/day  Aortic valve stenosis  moderate aortic valve stenosis  Repeat echocardiogram in 1 year  Sleep apnea with use of continuous positive airway pressure (CPAP) - Plan: EKG 12-Lead Recommended weight loss, managed by primary care  Tobacco smoker within last 12 months - Plan: EKG 12-Lead Down to 1 cigarette/day  Pancreatic mass Followed by oncology  Leg pain Recent lower extremity arterial Doppler November 2018 Mild LE PAD  Chest pain Stress test with no ischemia  Disposition: Follow-up 1 year   Total encounter time more than 25 minutes  Greater than 50% was spent in  counseling and coordination of care with the patient    Orders Placed This Encounter  Procedures  . EKG 12-Lead     Signed, Esmond Plants, M.D., Ph.D. 11/22/2017  [place], [place]

## 2017-11-22 ENCOUNTER — Encounter: Payer: Self-pay | Admitting: Cardiovascular Disease

## 2017-11-22 ENCOUNTER — Ambulatory Visit (INDEPENDENT_AMBULATORY_CARE_PROVIDER_SITE_OTHER): Payer: Medicare Other | Admitting: Cardiovascular Disease

## 2017-11-22 VITALS — BP 136/90 | HR 85 | Ht 69.0 in | Wt 269.5 lb

## 2017-11-22 DIAGNOSIS — I350 Nonrheumatic aortic (valve) stenosis: Secondary | ICD-10-CM | POA: Diagnosis not present

## 2017-11-22 DIAGNOSIS — I700 Atherosclerosis of aorta: Secondary | ICD-10-CM | POA: Diagnosis not present

## 2017-11-22 DIAGNOSIS — J449 Chronic obstructive pulmonary disease, unspecified: Secondary | ICD-10-CM

## 2017-11-22 DIAGNOSIS — F17200 Nicotine dependence, unspecified, uncomplicated: Secondary | ICD-10-CM

## 2017-11-22 DIAGNOSIS — I10 Essential (primary) hypertension: Secondary | ICD-10-CM | POA: Diagnosis not present

## 2017-11-22 NOTE — Patient Instructions (Addendum)
Medication Instructions:   No medication changes made  Labwork:  No new labs needed  Testing/Procedures:  We will order an echocardiogram in one year for aortic valve stenosis   Follow-Up: It was a pleasure seeing you in the office today. Please call us if you have new issues that need to be addressed before your next appt.  [phone]  Your physician wants you to follow-up in: 12 months after echo.  You will receive a reminder letter in the mail two months in advance. If you don't receive a letter, please call our office to schedule the follow-up appointment.  If you need a refill on your cardiac medications before your next appointment, please call your pharmacy.

## 2017-11-23 ENCOUNTER — Encounter: Payer: Self-pay | Admitting: Nurse Practitioner

## 2017-12-15 ENCOUNTER — Telehealth: Payer: Self-pay

## 2017-12-15 NOTE — Telephone Encounter (Signed)
Contacted [facility] concerning follow-up with patient. Per [facility], pt cancelled appointment.   Contacted Mr. Herms. He cancelled the appointment due to transportation. Mr. Jurewicz states that he has found transportation.  Rescheduled patient's appointment with him on the phone. 3/19 @ 10am with Dr. Mariah Milling @ [place]. [phone])

## 2017-12-20 DIAGNOSIS — D490 Neoplasm of unspecified behavior of digestive system: Secondary | ICD-10-CM | POA: Diagnosis not present

## 2017-12-24 DIAGNOSIS — D490 Neoplasm of unspecified behavior of digestive system: Secondary | ICD-10-CM | POA: Insufficient documentation

## 2017-12-30 ENCOUNTER — Ambulatory Visit (INDEPENDENT_AMBULATORY_CARE_PROVIDER_SITE_OTHER): Payer: Medicare Other | Admitting: Nurse Practitioner

## 2017-12-30 ENCOUNTER — Other Ambulatory Visit: Payer: Self-pay

## 2017-12-30 ENCOUNTER — Encounter: Payer: Self-pay | Admitting: Nurse Practitioner

## 2017-12-30 VITALS — BP 116/64 | HR 77 | Ht 69.0 in | Wt 263.2 lb

## 2017-12-30 DIAGNOSIS — F331 Major depressive disorder, recurrent, moderate: Secondary | ICD-10-CM | POA: Diagnosis not present

## 2017-12-30 DIAGNOSIS — I10 Essential (primary) hypertension: Secondary | ICD-10-CM

## 2017-12-30 DIAGNOSIS — K2100 Gastro-esophageal reflux disease with esophagitis, without bleeding: Secondary | ICD-10-CM

## 2017-12-30 DIAGNOSIS — Z8546 Personal history of malignant neoplasm of prostate: Secondary | ICD-10-CM | POA: Diagnosis not present

## 2017-12-30 DIAGNOSIS — Z23 Encounter for immunization: Secondary | ICD-10-CM | POA: Diagnosis not present

## 2017-12-30 DIAGNOSIS — Z7189 Other specified counseling: Secondary | ICD-10-CM | POA: Diagnosis not present

## 2017-12-30 DIAGNOSIS — Z1211 Encounter for screening for malignant neoplasm of colon: Secondary | ICD-10-CM

## 2017-12-30 DIAGNOSIS — R011 Cardiac murmur, unspecified: Secondary | ICD-10-CM | POA: Diagnosis not present

## 2017-12-30 DIAGNOSIS — J449 Chronic obstructive pulmonary disease, unspecified: Secondary | ICD-10-CM

## 2017-12-30 DIAGNOSIS — Z716 Tobacco abuse counseling: Secondary | ICD-10-CM | POA: Diagnosis not present

## 2017-12-30 MED ORDER — BUDESONIDE-FORMOTEROL FUMARATE 160-4.5 MCG/ACT IN AERO: 2.0000 | INHALATION_SPRAY | Freq: Two times a day (BID) | RESPIRATORY_TRACT | 3 refills | Status: DC

## 2017-12-30 MED ORDER — BUPROPION HCL ER (XL) 300 MG PO TB24: 300.0000 mg | ORAL_TABLET | Freq: Every day | ORAL | 1 refills | Status: DC

## 2017-12-30 MED ORDER — LOSARTAN POTASSIUM 100 MG PO TABS: 100.0000 mg | ORAL_TABLET | Freq: Every day | ORAL | 1 refills | Status: DC

## 2017-12-30 MED ORDER — OMEPRAZOLE 40 MG PO CPDR: 40.0000 mg | DELAYED_RELEASE_CAPSULE | Freq: Two times a day (BID) | ORAL | 3 refills | Status: DC | PRN

## 2017-12-30 MED ORDER — CLONIDINE HCL 0.1 MG PO TABS: 0.1000 mg | ORAL_TABLET | Freq: Two times a day (BID) | ORAL | 11 refills | Status: DC

## 2017-12-30 NOTE — Patient Instructions (Addendum)
Nathaniel Little,   Thank you for coming in to clinic today.  For your symbicort- increase dose with the inhaler.  Still take 2 puffs twice daily  For your bupropion - CHANGE to once daily dosing for 300 mg once daily.  Have a discussion with your daughter about your wishes for healthcare and possibility of dying.  You don't have anything today that tells me you will die soon, but these are important discussions to have.  Make your Living Will and your Healthcare Power of [place].  Please schedule a follow-up appointment with Cassell Smiles, AGNP. Return in about 3 months (around 04/01/2018) for hypertension, COPD.  If you have any other questions or concerns, please feel free to call the clinic or send a message through [place]. You may also schedule an earlier appointment if necessary.  You will receive a survey after today's visit either digitally by e-mail or paper by [name]. Your experiences and feedback matter to [name].  Please respond so we know how we are doing as we provide care for you.   Cassell Smiles, DNP, AGNP-BC Adult Gerontology Nurse Practitioner [place]

## 2017-12-30 NOTE — Progress Notes (Signed)
Subjective:    Patient ID: Nathaniel Little, male    DOB: [birth year], 72 y.o.   MRN: [id number]  Nathaniel Little is a 72 y.o. male presenting on 12/30/2017 for Annual Exam (insomnia, pt states he's having issues staying asleep. dry mouth x 2 weeks. Pt concern it could be related to CPAP. )   HPI Dry Mouth: Has occurred x 2 weeks.  Feels like in a desert and is having dysguesia.  No new medications have been started onset of dry mouth.Patient continues to drink adequate water intake.  Patient also asks about thrush with his new inhaler.  He does not have any sore tongue with his dry mouth.  Depression: Is staying depressed and bored without transportation except for county bus.  Overall he feels he is doing well on his Wellbutrin.  He notes social isolation is most significant contributor to current depression state.   Insomnia: he can fall asleep easily, but wakes up every 2 hours.  Is sleeping total of 3 hours during daytime (boredom).    COPD: Patient reports ongoing shortness of breath.  Has felt much better when taking inhalers daily and regularly.  However, patient has missed last 3 days of inhalers.  Pancreatic cyst with high potential for malignancy: Patient reports [facility] specialist has recommended watchful waiting with repeat MRI in 6 months to reassess.  Patient has not discussed full diagnosis with his daughter.  He has discussed that he is having pancreatic workup done.  Patient's daughter has previously expressed to me she is frustrated that her father will not talk to her about his health.    HEALTH MAINTENANCE: Weight/BMI: Morbidly obese with BMI 38.9 and comorbid conditions of GERD, hypertension, osteoarthritis Physical activity: Limited, but patient does walk some every day. Diet: High salt, highly processed, some vegetables, lean proteins, rare fried foods. Seatbelt: Always Sunscreen: rarely Prostate exam/PSA: Patient has history of prostate cancer needs PSA Colon Cancer Screen:  Patient without recent colonoscopy.  No records.  Patient agrees to color guard screening. HIV/HEP C: Patient agrees to have hep C screening  Optometry: Within the last 1 year Dentistry: No regular dental care Tobacco: Half cigarette per day Alcohol: None  VACCINES: Tetanus: Not up-to-date, but patient has not had recent injury. Influenza: Previously declined and continues to decline Pneumonia: Patient reports prior pneumonia vaccine, but does not have records. Shingles: Patient declines  Past Medical History:  Diagnosis Date  . Arthritis   . Cancer ([place]) 03/2013   Prostate  . COPD (chronic obstructive pulmonary disease) ([place])   . Depression   . Heart disease   . Heart murmur   . Hypertension   . Lymphedema   . Sleep apnea with use of continuous positive airway pressure (CPAP)    Past Surgical History:  Procedure Laterality Date  . ADENOIDECTOMY    . APPENDECTOMY    . EUS N/A 10/13/2017   Procedure: FULL UPPER ENDOSCOPIC ULTRASOUND (EUS) RADIAL;  Surgeon: Holly Bodily, MD;  Location: [facility] ENDOSCOPY;  Service: Gastroenterology;  Laterality: N/A;  . TONSILLECTOMY     Social History   Socioeconomic History  . Marital status: Widowed    Spouse name: Not on file  . Number of children: Not on file  . Years of education: Not on file  . Highest education level: Not on file  Occupational History  . Not on file  Social Needs  . Financial resource strain: Not on file  . Food insecurity:    Worry: Not on file  Inability: Not on file  . Transportation needs:    Medical: Not on file    Non-medical: Not on file  Tobacco Use  . Smoking status: Current Every Day Smoker    Packs/day: 0.25    Years: 68.00    Pack years: 17.00    Types: Cigarettes    Last attempt to quit: 04/01/2017    Years since quitting: 0.7  . Smokeless tobacco: Former Systems developer    Types: Chew, [place] date: 05/16/2016  . Tobacco comment: 1 Cigarettes a day   Substance and Sexual Activity  .  Alcohol use: Yes    Alcohol/week: 0.6 oz    Types: 1 Cans of beer per week    Comment: occ  . Drug use: No  . Sexual activity: Not Currently  Lifestyle  . Physical activity:    Days per week: Not on file    Minutes per session: Not on file  . Stress: Not on file  Relationships  . Social connections:    Talks on phone: Not on file    Gets together: Not on file    Attends religious service: Not on file    Active member of club or organization: Not on file    Attends meetings of clubs or organizations: Not on file    Relationship status: Not on file  . Intimate partner violence:    Fear of current or ex partner: Not on file    Emotionally abused: Not on file    Physically abused: Not on file    Forced sexual activity: Not on file  Other Topics Concern  . Not on file  Social History Narrative  . Not on file   Family History  Problem Relation Age of Onset  . Breast cancer Mother   . Cancer Father        Black Lung  . Cerebral palsy Brother   . Tuberculosis Paternal Uncle   . Prostate cancer Neg Hx   . Kidney cancer Neg Hx   . Bladder Cancer Neg Hx    Current Outpatient Medications on File Prior to Visit  Medication Sig  . albuterol (PROVENTIL HFA;VENTOLIN HFA) 108 (90 Base) MCG/ACT inhaler Inhale 2 puffs into the lungs every 6 (six) hours as needed for wheezing or shortness of breath.  [name] aspirin 81 MG tablet Take 81 mg by mouth daily.  [name] escitalopram (LEXAPRO) 10 MG tablet TAKE 1 TABLET BY MOUTH ONCE DAILY  . hydrochlorothiazide (HYDRODIURIL) 12.5 MG tablet Take 1 tablet (12.5 mg total) by mouth daily.  [name] ipratropium-albuterol (DUONEB) 0.5-2.5 (3) MG/3ML SOLN Take 3 mLs by nebulization every 6 (six) hours as needed (wheezing, shortness of breath).  . pantoprazole (PROTONIX) 40 MG tablet Take 40 mg daily by mouth.  . polyethylene glycol (MIRALAX / GLYCOLAX) packet Take 17 g by mouth daily.  [name] tiotropium (SPIRIVA HANDIHALER) 18 MCG inhalation capsule Place 1 capsule (18 mcg  total) into inhaler and inhale daily.   No current facility-administered medications on file prior to visit.     Review of Systems Per HPI unless specifically indicated above     Objective:    BP 116/64 (BP Location: Right Arm, Patient Position: Sitting, Cuff Size: Large)   Pulse 77   Ht 5\' 9"  (1.753 m)   Wt 263 lb 3.2 oz (119.4 kg)   BMI 38.87 kg/m   Wt Readings from Last 3 Encounters:  12/30/17 263 lb 3.2 oz (119.4 kg)  11/22/17 269 lb 8  oz (122.2 kg)  10/27/17 272 lb 3.2 oz (123.5 kg)    Physical Exam  Constitutional: He is oriented to person, place, and time. He appears well-developed and well-nourished. No distress.  HENT:  Head: Normocephalic and atraumatic.  Right Ear: External ear normal.  Left Ear: External ear normal.  Nose: Nose normal.  Mouth/Throat: Oropharynx is clear and moist.  Eyes: Pupils are equal, round, and reactive to light. Conjunctivae are normal.  Neck: Normal range of motion. Neck supple. No JVD present. Carotid bruit is not present. No tracheal deviation present. No thyromegaly present.  Cardiovascular: Normal rate, regular rhythm, S1 normal, S2 normal and intact distal pulses. Exam reveals no gallop and no friction rub.  Murmur heard.  Crescendo systolic murmur is present with a grade of 5/6. Pulmonary/Chest: Effort normal. No respiratory distress. He has wheezes (Throughout all lobes). He has no rales.  Abdominal: Soft. Bowel sounds are normal. He exhibits distension (Very rounded abdomen). There is no tenderness.  Musculoskeletal: Normal range of motion.  Lymphadenopathy:    He has no cervical adenopathy.  Neurological: He is alert and oriented to person, place, and time. No cranial nerve deficit.  Skin: Skin is warm and dry.  Psychiatric: He has a normal mood and affect. His behavior is normal. Judgment and thought content normal.  Nursing note and vitals reviewed.     Assessment & Plan:   Problem List Items Addressed This Visit       Cardiovascular and Mediastinum   Hypertension Stable and well-controlled today.  Patient requests refill of medications.  Refill clonidine and losartan.  No changes today.   Relevant Medications   cloNIDine (CATAPRES) 0.1 MG tablet   losartan (COZAAR) 100 MG tablet     Respiratory   COPD (chronic obstructive pulmonary disease) (HCC) Acute worsening of COPD control with cessation of inhalers 3 days ago.  Control remains suboptimal even with daily use of Symbicort.  Physical exam today reveals significant wheezing.  Plan: 1.  Increase Symbicort to 160-4.5 mcg per actuation inhaler 2 puffs twice daily 2.  Consider PFTs in future 3.  Encourage smoking cessation 4.  Consider pulmonology referral   Relevant Medications   budesonide-formoterol (SYMBICORT) 160-4.5 MCG/ACT inhaler     Digestive   GERD (gastroesophageal reflux disease) Patient reports stable acid reflux on omeprazole.  Requests refill today.  No changes.  Continue omeprazole 40 mg once daily.   Relevant Medications   omeprazole (PRILOSEC) 40 MG capsule     Other   Heart murmur, systolic Chronic grade 5 out of 6 holosystolic murmur with harsh blowing sound.  Consistent with aortic stenosis.  Plan: 1.  Patient remains asymptomatic so will defer echocardiogram. 2.  Continue annual cardiology monitoring.    Personal history of prostate cancer  Patient with history of prostate cancer.  No recent lab evaluation.  Needs PSA check.  Plan: 1.  PSA order placed.  Insomnia - Primary Acute insomnia per patient report.  Since patient is satisfied with current level of sleep and often feels rested no medication additions will be made.  Plan: 1.  Educated patient on decreasing quality of sleep as he becomes older is normal. 2.  Encourage patient to move afternoon nap so better sleep quality can possibly occur at night. 3.  We will change Wellbutrin from twice daily dosing to extended release 24-hour tablet once daily. 4.   Continue good sleep hygiene and follow-up as needed    Depression Chronic and stable.  Social isolation is  contributing most to worsening depression at this point in time.  Wellbutrin and escitalopram are currently controlling symptoms well.  Plan: 1.  Provided information about senior center for patient. 2.  Patient should discuss qualifying for Medicaid transportation for healthcare transport. 3.  Encouraged patient to stay active, occupied during the day to promote nighttime sleep. 4.  Follow-up as needed   Relevant Medications   buPROPion (WELLBUTRIN XL) 300 MG 24 hr tablet    Other Visit Diagnoses    Encounter for smoking cessation counseling     Patient continues to work to decrease smoking and has been successful to reduce to half cigarettes per day.  Continue Wellbutrin, but change to once daily dosing to avoid insomnia.    Advanced directives, counseling/discussion     A voluntary discussion about advance care planning including the explanation and discussion of advance directives was extensively discussed with the patient.  Explanation about the health care proxy and living will was reviewed and packet with forms with explanation of how to fill them out was given.  During this discussion, the patient was able to identify a health care proxy as his daughter Sherri Rad and plans to fill out the paperwork required.  Encouraged patient to have discussions with his daughter especially about advanced directives.  He has not previously established advanced directives, but has received information before and has thought about his plan for advanced directives.  Patient was offered a separate [place] visit for further assistance with forms.     Time spent: 10 minutes        Individuals present: Rayburn Felt, Cassell Smiles, AGNP    Colon cancer screening     Pt requiring colon cancer screening.  No family history of colon cancer.  Plan: - Discussed timing for initiation of  colon cancer screening ACS vs USPSTF guidelines - Mutual decision making discussion for options of colonoscopy vs cologuard.  Pt prefers Cologuard. - Ordered Cologuard today   Relevant Orders   Cologuard   Need for vaccination against Streptococcus pneumoniae using pneumococcal conjugate vaccine 13     Patient age greater than 40 years of age.  Prior vaccination with pneumonia vaccine, but no knowledge of when or where in no records.  Plan: 1.  Administer Prevnar 13 today 2.  Administer Pneumovax 23 in 1 year   Relevant Orders   Pneumococcal conjugate vaccine 13-valent IM (Completed)      Meds ordered this encounter  Medications  . omeprazole (PRILOSEC) 40 MG capsule    Sig: Take 1 capsule (40 mg total) by mouth 3 times/day as needed-between meals & bedtime.    Dispense:  90 capsule    Refill:  3    Order Specific Question:   Supervising Provider    Answer:   Olin Hauser [[id number]]  . buPROPion (WELLBUTRIN XL) 300 MG 24 hr tablet    Sig: Take 1 tablet (300 mg total) by mouth daily.    Dispense:  90 tablet    Refill:  1    Order Specific Question:   Supervising Provider    Answer:   Olin Hauser [[id number]]  . cloNIDine (CATAPRES) 0.1 MG tablet    Sig: Take 1 tablet (0.1 mg total) by mouth 2 (two) times daily.    Dispense:  60 tablet    Refill:  11    Order Specific Question:   Supervising Provider    Answer:   Olin Hauser [[id number]]  . losartan (COZAAR) 100  MG tablet    Sig: Take 1 tablet (100 mg total) by mouth daily.    Dispense:  90 tablet    Refill:  1    Order Specific Question:   Supervising Provider    Answer:   Olin Hauser [[id number]]  . budesonide-formoterol (SYMBICORT) 160-4.5 MCG/ACT inhaler    Sig: Inhale 2 puffs into the lungs 2 (two) times daily.    Dispense:  1 Inhaler    Refill:  3    Order Specific Question:   Supervising Provider    Answer:   Olin Hauser [[id number]]      Follow up plan: Return in about 3  months (around 04/01/2018) for hypertension, COPD.  Cassell Smiles, DNP, AGPCNP-BC Adult Gerontology Primary Care Nurse Practitioner [facility] 12/30/2017, 5:55 PM

## 2018-01-30 ENCOUNTER — Telehealth: Payer: Self-pay | Admitting: Nurse Practitioner

## 2018-01-30 NOTE — Telephone Encounter (Signed)
Left message about scheduling [place] w [facility] .

## 2018-01-31 ENCOUNTER — Ambulatory Visit: Payer: Medicare Other | Admitting: Nurse Practitioner

## 2018-02-08 DIAGNOSIS — G4733 Obstructive sleep apnea (adult) (pediatric): Secondary | ICD-10-CM | POA: Diagnosis not present

## 2018-02-28 ENCOUNTER — Ambulatory Visit (INDEPENDENT_AMBULATORY_CARE_PROVIDER_SITE_OTHER): Payer: Medicare Other

## 2018-02-28 VITALS — BP 162/82 | HR 84 | Temp 99.4°F | Resp 18 | Ht 69.0 in | Wt 255.0 lb

## 2018-02-28 DIAGNOSIS — Z1159 Encounter for screening for other viral diseases: Secondary | ICD-10-CM | POA: Diagnosis not present

## 2018-02-28 DIAGNOSIS — Z0000 Encounter for general adult medical examination without abnormal findings: Secondary | ICD-10-CM | POA: Diagnosis not present

## 2018-02-28 NOTE — Progress Notes (Addendum)
Pt had multiple bed bugs on his person that were crawling out from clothing and onto chair, floor, and wall during his appointment.

## 2018-02-28 NOTE — Patient Instructions (Signed)
Nathaniel Little , Thank you for taking time to come for your Medicare Wellness Visit. I appreciate your ongoing commitment to your health goals. Please review the following plan we discussed and let me know if I can assist you in the future.   Screening recommendations/referrals: Colonoscopy: due - cologuard was done- waiting on results  Recommended yearly ophthalmology/optometry visit for glaucoma screening and checkup Recommended yearly dental visit for hygiene and checkup  Vaccinations: Influenza vaccine: due 06/2018 Pneumococcal vaccine: pneumovax 23 due 12/31/2018 Tdap vaccine: due, check with your insurance company for coverage Shingles vaccine: shingrix eligible, check with your insurance company for coverage  Advanced directives: Advance directive discussed with you today. I have provided a copy for you to complete at home and have notarized. Once this is complete please bring a copy in to our office so we can scan it into your chart.  Conditions/risks identified: smoking cessation discussed  Next appointment: Follow up in one year for your annual wellness exam.   Preventive Care 65 Years and Older, Male Preventive care refers to lifestyle choices and visits with your health care provider that can promote health and wellness. What does preventive care include?  A yearly physical exam. This is also called an annual well check.  Dental exams once or twice a year.  Routine eye exams. Ask your health care provider how often you should have your eyes checked.  Personal lifestyle choices, including:  Daily care of your teeth and gums.  Regular physical activity.  Eating a healthy diet.  Avoiding tobacco and drug use.  Limiting alcohol use.  Practicing safe sex.  Taking low doses of aspirin every day.  Taking vitamin and mineral supplements as recommended by your health care provider. What happens during an annual well check? The services and screenings done by your health  care provider during your annual well check will depend on your age, overall health, lifestyle risk factors, and family history of disease. Counseling  Your health care provider may ask you questions about your:  Alcohol use.  Tobacco use.  Drug use.  Emotional well-being.  Home and relationship well-being.  Sexual activity.  Eating habits.  History of falls.  Memory and ability to understand (cognition).  Work and work Statistician. Screening  You may have the following tests or measurements:  Height, weight, and BMI.  Blood pressure.  Lipid and cholesterol levels. These may be checked every 5 years, or more frequently if you are over 98 years old.  Skin check.  Lung cancer screening. You may have this screening every year starting at age 41 if you have a 30-pack-year history of smoking and currently smoke or have quit within the past 15 years.  Fecal occult blood test (FOBT) of the stool. You may have this test every year starting at age 18.  Flexible sigmoidoscopy or colonoscopy. You may have a sigmoidoscopy every 5 years or a colonoscopy every 10 years starting at age 15.  Prostate cancer screening. Recommendations will vary depending on your family history and other risks.  Hepatitis C blood test.  Hepatitis B blood test.  Sexually transmitted disease (STD) testing.  Diabetes screening. This is done by checking your blood sugar (glucose) after you have not eaten for a while (fasting). You may have this done every 1-3 years.  Abdominal aortic aneurysm (AAA) screening. You may need this if you are a current or former smoker.  Osteoporosis. You may be screened starting at age 54 if you are at high risk.  Talk with your health care provider about your test results, treatment options, and if necessary, the need for more tests. Vaccines  Your health care provider may recommend certain vaccines, such as:  Influenza vaccine. This is recommended every  year.  Tetanus, diphtheria, and acellular pertussis (Tdap, Td) vaccine. You may need a Td booster every 10 years.  Zoster vaccine. You may need this after age 42.  Pneumococcal 13-valent conjugate (PCV13) vaccine. One dose is recommended after age 87.  Pneumococcal polysaccharide (PPSV23) vaccine. One dose is recommended after age 55. Talk to your health care provider about which screenings and vaccines you need and how often you need them. This information is not intended to replace advice given to you by your health care provider. Make sure you discuss any questions you have with your health care provider. Document Released: 10/17/2015 Document Revised: 06/09/2016 Document Reviewed: 07/22/2015 Elsevier Interactive Patient Education  2017 [place] Prevention in the Home Falls can cause injuries. They can happen to people of all ages. There are many things you can do to make your home safe and to help prevent falls. What can I do on the outside of my home?  Regularly fix the edges of walkways and driveways and fix any cracks.  Remove anything that might make you trip as you walk through a door, such as a raised step or threshold.  Trim any bushes or trees on the path to your home.  Use bright outdoor lighting.  Clear any walking paths of anything that might make someone trip, such as rocks or tools.  Regularly check to see if handrails are loose or broken. Make sure that both sides of any steps have handrails.  Any raised decks and porches should have guardrails on the edges.  Have any leaves, snow, or ice cleared regularly.  Use sand or salt on walking paths during winter.  Clean up any spills in your garage right away. This includes oil or grease spills. What can I do in the bathroom?  Use night lights.  Install grab bars by the toilet and in the tub and shower. Do not use towel bars as grab bars.  Use non-skid mats or decals in the tub or shower.  If you  need to sit down in the shower, use a plastic, non-slip stool.  Keep the floor dry. Clean up any water that spills on the floor as soon as it happens.  Remove soap buildup in the tub or shower regularly.  Attach bath mats securely with double-sided non-slip rug tape.  Do not have throw rugs and other things on the floor that can make you trip. What can I do in the bedroom?  Use night lights.  Make sure that you have a light by your bed that is easy to reach.  Do not use any sheets or blankets that are too big for your bed. They should not hang down onto the floor.  Have a firm chair that has side arms. You can use this for support while you get dressed.  Do not have throw rugs and other things on the floor that can make you trip. What can I do in the kitchen?  Clean up any spills right away.  Avoid walking on wet floors.  Keep items that you use a lot in easy-to-reach places.  If you need to reach something above you, use a strong step stool that has a grab bar.  Keep electrical cords out of the way.  Do not use floor polish or wax that makes floors slippery. If you must use wax, use non-skid floor wax.  Do not have throw rugs and other things on the floor that can make you trip. What can I do with my stairs?  Do not leave any items on the stairs.  Make sure that there are handrails on both sides of the stairs and use them. Fix handrails that are broken or loose. Make sure that handrails are as long as the stairways.  Check any carpeting to make sure that it is firmly attached to the stairs. Fix any carpet that is loose or worn.  Avoid having throw rugs at the top or bottom of the stairs. If you do have throw rugs, attach them to the floor with carpet tape.  Make sure that you have a light switch at the top of the stairs and the bottom of the stairs. If you do not have them, ask someone to add them for you. What else can I do to help prevent falls?  Wear shoes  that:  Do not have high heels.  Have rubber bottoms.  Are comfortable and fit you well.  Are closed at the toe. Do not wear sandals.  If you use a stepladder:  Make sure that it is fully opened. Do not climb a closed stepladder.  Make sure that both sides of the stepladder are locked into place.  Ask someone to hold it for you, if possible.  Clearly mark and make sure that you can see:  Any grab bars or handrails.  First and last steps.  Where the edge of each step is.  Use tools that help you move around (mobility aids) if they are needed. These include:  Canes.  Walkers.  Scooters.  Crutches.  Turn on the lights when you go into a dark area. Replace any light bulbs as soon as they burn out.  Set up your furniture so you have a clear path. Avoid moving your furniture around.  If any of your floors are uneven, fix them.  If there are any pets around you, be aware of where they are.  Review your medicines with your doctor. Some medicines can make you feel dizzy. This can increase your chance of falling. Ask your doctor what other things that you can do to help prevent falls. This information is not intended to replace advice given to you by your health care provider. Make sure you discuss any questions you have with your health care provider. Document Released: 07/17/2009 Document Revised: 02/26/2016 Document Reviewed: 10/25/2014 Elsevier Interactive Patient Education  2017 [place] provider would like to you have your annual eye exam. Please contact your current eye doctor or here are some good options for you to contact.   [facility] Address: [address] [place], [place] [address]   Address: [address] [place], [place] [address]  Phone: [phone]      Phone: [phone]  Website: [email or web address]    Website: [email or web address]     [facility]  Address:  [place], [place], [place] [address]   Address: [place], [place], [place] [address] Phone: [phone]      Phone: [phone]    [facility] Address: [place], [place], [place] [address]  Phone: [phone]

## 2018-02-28 NOTE — Progress Notes (Signed)
Subjective:   Nathaniel Little is a 72 y.o. male who presents for Medicare Annual/Subsequent preventive examination.  Review of Systems:    Cardiac Risk Factors include: hypertension;male gender;advanced age (>41men, >54 women);dyslipidemia;obesity (BMI >30kg/m2)     Objective:    Vitals: BP (!) 162/82 (BP Location: Left Arm, Patient Position: Sitting)   Pulse 84   Temp 99.4 F (37.4 C) (Oral)   Resp 18   Ht 5\' 9"  (1.753 m)   Wt 255 lb (115.7 kg)   BMI 37.66 kg/m   Body mass index is 37.66 kg/m.  Advanced Directives 02/28/2018 10/13/2017 08/24/2017 08/04/2017 08/04/2017 07/25/2017 02/25/2017  Does Patient Have a Medical Advance Directive? No No No No No No No  Would patient like information on creating a medical advance directive? Yes (MAU/Ambulatory/Procedural Areas - Information given) No - Patient declined - - No - Patient declined No - Patient declined -    Tobacco Social History   Tobacco Use  Smoking Status Current Every Day Smoker  . Packs/day: 0.25  . Years: 68.00  . Pack years: 17.00  . Types: Cigarettes  . Last attempt to quit: 04/01/2017  . Years since quitting: 0.9  Smokeless Tobacco Former Systems developer  . Types: Chew, Snuff  . Quit date: 05/16/2016  Tobacco Comment   1/2 Cigarette a day      Ready to quit: Yes Counseling given: Yes Comment: 1/2 Cigarette a day    Clinical Intake:  Pre-visit preparation completed: Yes  Pain : No/denies pain     Nutritional Status: BMI > 30  Obese Nutritional Risks: None Diabetes: No  How often do you need to have someone help you when you read instructions, pamphlets, or other written materials from your doctor or pharmacy?: 2 - Rarely What is the last grade level you completed in school?: completed special ed classes  Interpreter Needed?: No  Information entered by :: Tiffany Hill,LPN   Past Medical History:  Diagnosis Date  . Arthritis   . Cancer ([place]) 03/2013   Prostate  . COPD (chronic obstructive pulmonary  disease) ([place])   . Depression   . Heart disease   . Heart murmur   . Hypertension   . Lymphedema   . Sleep apnea with use of continuous positive airway pressure (CPAP)    Past Surgical History:  Procedure Laterality Date  . ADENOIDECTOMY    . APPENDECTOMY    . EUS N/A 10/13/2017   Procedure: FULL UPPER ENDOSCOPIC ULTRASOUND (EUS) RADIAL;  Surgeon: Holly Bodily, MD;  Location: [facility] ENDOSCOPY;  Service: Gastroenterology;  Laterality: N/A;  . TONSILLECTOMY     Family History  Problem Relation Age of Onset  . Breast cancer Mother   . Cancer Father        Black Lung  . Cerebral palsy Brother   . Tuberculosis Paternal Uncle   . Prostate cancer Neg Hx   . Kidney cancer Neg Hx   . Bladder Cancer Neg Hx    Social History   Socioeconomic History  . Marital status: Widowed    Spouse name: Not on file  . Number of children: Not on file  . Years of education: Not on file  . Highest education level: Not on file  Occupational History  . Not on file  Social Needs  . Financial resource strain: Very hard  . Food insecurity:    Worry: Often true    Inability: Often true  . Transportation needs:    Medical: Yes  Non-medical: Yes  Tobacco Use  . Smoking status: Current Every Day Smoker    Packs/day: 0.25    Years: 68.00    Pack years: 17.00    Types: Cigarettes    Last attempt to quit: 04/01/2017    Years since quitting: 0.9  . Smokeless tobacco: Former Systems developer    Types: Chew, [place] date: 05/16/2016  . Tobacco comment: 1/2 Cigarette a day   Substance and Sexual Activity  . Alcohol use: Yes    Alcohol/week: 0.6 oz    Types: 1 Cans of beer per week    Comment: occ  . Drug use: No  . Sexual activity: Not Currently  Lifestyle  . Physical activity:    Days per week: 0 days    Minutes per session: 0 min  . Stress: To some extent  Relationships  . Social connections:    Talks on phone: More than three times a week    Gets together: More than three times a week      Attends religious service: Never    Active member of club or organization: No    Attends meetings of clubs or organizations: Never    Relationship status: Widowed  Other Topics Concern  . Not on file  Social History Narrative  . Not on file    Outpatient Encounter Medications as of 02/28/2018  Medication Sig  . albuterol (PROVENTIL HFA;VENTOLIN HFA) 108 (90 Base) MCG/ACT inhaler Inhale 2 puffs into the lungs every 6 (six) hours as needed for wheezing or shortness of breath.  [name] aspirin 81 MG tablet Take 81 mg by mouth daily.  . budesonide-formoterol (SYMBICORT) 160-4.5 MCG/ACT inhaler Inhale 2 puffs into the lungs 2 (two) times daily.  [name] buPROPion (WELLBUTRIN XL) 300 MG 24 hr tablet Take 1 tablet (300 mg total) by mouth daily.  . cloNIDine (CATAPRES) 0.1 MG tablet Take 1 tablet (0.1 mg total) by mouth 2 (two) times daily.  [name] escitalopram (LEXAPRO) 10 MG tablet TAKE 1 TABLET BY MOUTH ONCE DAILY  . ipratropium-albuterol (DUONEB) 0.5-2.5 (3) MG/3ML SOLN Take 3 mLs by nebulization every 6 (six) hours as needed (wheezing, shortness of breath).  . losartan (COZAAR) 100 MG tablet Take 1 tablet (100 mg total) by mouth daily.  [name] omeprazole (PRILOSEC) 40 MG capsule Take 1 capsule (40 mg total) by mouth 3 times/day as needed-between meals & bedtime.  [name] tiotropium (SPIRIVA HANDIHALER) 18 MCG inhalation capsule Place 1 capsule (18 mcg total) into inhaler and inhale daily.  . hydrochlorothiazide (HYDRODIURIL) 12.5 MG tablet Take 1 tablet (12.5 mg total) by mouth daily. (Patient not taking: Reported on 02/28/2018)  . pantoprazole (PROTONIX) 40 MG tablet Take 40 mg daily by mouth.  . polyethylene glycol (MIRALAX / GLYCOLAX) packet Take 17 g by mouth daily. (Patient not taking: Reported on 02/28/2018)   No facility-administered encounter medications on file as of 02/28/2018.     Activities of Daily Living In your present state of health, do you have any difficulty performing the following activities:  02/28/2018  Hearing? N  Vision? N  Difficulty concentrating or making decisions? N  Walking or climbing stairs? N  Dressing or bathing? N  Doing errands, shopping? N  Preparing Food and eating ? N  Using the Toilet? N  In the past six months, have you accidently leaked urine? N  Do you have problems with loss of bowel control? N  Managing your Medications? N  Managing your Finances? N  Housekeeping or managing  your Housekeeping? N  Some recent data might be hidden    Patient Care Team: [name], NP as PCP - General Clent Jacks, RN as Registered Nurse   Assessment:   This is a routine wellness examination for Nathaniel Little.  Exercise Activities and Dietary recommendations Current Exercise Habits: The patient does not participate in regular exercise at present, Exercise limited by: None identified  Goals    . Quit Smoking     Smoking cessation discussed       Fall Risk Fall Risk  02/28/2018 01/06/2017  Falls in the past year? No No   Is the patient's home free of loose throw rugs in walkways, pet beds, electrical cords, etc?   yes      Grab bars in the bathroom? no      Handrails on the stairs?   yes      Adequate lighting?   yes  Timed Get Up and Go Performed: Completed in 9 seconds with use of assistive device-cane , steady gait. No intervention needed at this time.   Depression Screen PHQ 2/9 Scores 02/28/2018 10/27/2017 04/20/2017 01/06/2017  PHQ - 2 Score 1 1 3 2   PHQ- 9 Score 5 6 4 5     Cognitive Function     6CIT Screen 02/28/2018  What Year? 0 points  What month? 0 points  What time? 0 points  Count back from 20 0 points  Months in reverse 0 points  Repeat phrase 0 points  Total Score 0    Immunization History  Administered Date(s) Administered  . Pneumococcal Conjugate-13 12/30/2017    Qualifies for Shingles Vaccine? Yes, discussed shingrix vaccine  Screening Tests Health Maintenance  Topic Date Due  . Hepatitis C Screening  1946-04-25   . COLONOSCOPY  07/25/1995  . TETANUS/TDAP  03/01/2019 (Originally 07/24/1965)  . INFLUENZA VACCINE  05/04/2018  . PNA vac Low Risk Adult (2 of 2 - PPSV23) 12/31/2018   Cancer Screenings: Lung: Low Dose CT Chest recommended if Age 60-80 years, 30 pack-year currently smoking OR have quit w/in 15years. Patient does not qualify. Colorectal: cologuard done, waiting on results   Additional Screenings:  Hepatitis C Screening:due, will order for future labs      Plan:  I have personally reviewed and addressed the Medicare Annual Wellness questionnaire and have noted the following in the patient's chart:  A. Medical and social history B. Use of alcohol, tobacco or illicit drugs  C. Current medications and supplements D. Functional ability and status E.  Nutritional status F.  Physical activity G. Advance directives H. List of other physicians I.  Hospitalizations, surgeries, and ER visits in previous 12 months J.  [place] such as hearing and vision if needed, cognitive and depression L. Referrals and appointments   In addition, I have reviewed and discussed with patient certain preventive protocols, quality metrics, and best practice recommendations. A written personalized care plan for preventive services as well as general preventive health recommendations were provided to patient.   Signed,  Tyler Aas, LPN Nurse Health Advisor   Nurse Notes:none

## 2018-03-08 ENCOUNTER — Telehealth: Payer: Self-pay

## 2018-03-08 NOTE — Telephone Encounter (Signed)
Returned patients call, states he hasnt heard anything about community resources. informed patient [name] called and left a message yesterday around 450pm. States he missed the call and he would be able to take the call anytime today. Routing to [employer].

## 2018-03-08 NOTE — Telephone Encounter (Signed)
I got him!  Can't find any bed bug assistance so far but will keep looking!  He also needs cataract surgery and has $400 copay.  His medicaid only pays medicare premium and he is $30 over medicaid limit for full medicaid which would cover copays.  He has been to [employer] so that was good.  He was in [employer] but didn't make it through [place] but will see if [facility] services could assist in am.  I did put his name on [facility] list but since he lives with dtr who could make meals not sure if they can service him until he is on his own.  Thanks for the referral!

## 2018-03-20 ENCOUNTER — Other Ambulatory Visit: Payer: Self-pay | Admitting: Nurse Practitioner

## 2018-03-20 DIAGNOSIS — K5901 Slow transit constipation: Secondary | ICD-10-CM

## 2018-03-29 ENCOUNTER — Other Ambulatory Visit: Payer: Self-pay | Admitting: *Deleted

## 2018-03-29 NOTE — Patient Outreach (Signed)
Nathaniel Little [facility]) Care Management  03/29/2018  Nathaniel Little 29-Apr-1946 [id number]   Telephone Screen  Referral Date: 03/29/18  Referral Source: MD Referral - Nathaniel Little Referral Reason: Housing need, financial needs and home infestation with bed bugs Insurance: Nathaniel Little HMO plan 2   Outreach attempt # 1 successful after a few messages and missed calls to Nathaniel Little home/mobile number No answer at Nathaniel Little's  home/mobile number. [facility] RN Little left HIPAA compliant voicemail message along with Little's contact info. No answer at [name] number (she is on his DPR in [place]). [facility] RN Little left HIPAA compliant voicemail message along with Little's contact info. Patient is able to verify HIPAA Nathaniel Little returned a call and left a message stating he was available all day and may be outside at intervals, Little called him back twice before he was able to answer Reviewed and addressed referral to Nathaniel Little with patient He confirms concerns with Housing need, financial needs and home infestation with bed bugs plus other concerns  As an updated he states  1) Housing he will be going with a [place] housing lady on April 10 2018 to look at apartments and believes he will find an apartment 2) bed bugs He reports he has placed all his belongings in bags or totes and sprayed and hopes his bed bug concern is resolved. 3) meals on wheels has contacted him and he has been informed that he will be contacted when their is an opening for someone to come visit him 4) he has received $15 of food stamps for June 2019 only 5) financial assistance He states he needs assist with $400 co pay prior to cataract surgery for his left eye per a [place] MD Reports he went to get glasses and was notified of the cataract. Reports he can see but 'not good"   Concern voiced to Nathaniel Little today: Need to see a podiatrist having trouble bending to cut his toe nails.  Little assisted him to find in  network podiatrist near his zip code using his united health care web site on the back of his insurance card.  Education provided related to the differences in the co pays on his insurance card related to the in network or out of network status of a MD. Encouraged him to also call the customer service number to prevent out of network fees for MD and services He expressed appreciation of services rendered and education. Names of podiatrist he was able to write down with contact information to discuss with Nathaniel Little on April 11 2018 includes Nathaniel Little, Nathaniel Little and Nathaniel Little, Nathaniel Little [phone]  Social:  Nathaniel Little is a pleasant widowed male who presently lives with his daughter, Nathaniel Little, her 2 dogs and states he pays her rent. He has a hx of being homeless.  He reports he is independent with ADLs and IADLs. He reports He does not have a car nor a driver's license. He reports he does not spell well. He reports he was in special ed but graduated He reports he has Medicaid only to assist to pay for his united healthcare coverage. He states he does not have a medicaid card He does not have e mail nor a computer He reports he likes to be active, "not bored" and he likes to make jewelry. When he gets his new apartment he wants to get a card table to  set up his jewelry making tasks.   He denies falls but states he has to be careful around the 2 dogs  Conditions: COPD (wears a CPAP), HTN, cataract on the left eye, hx of prostate cancer, hx of smoking, pancreatitis, arthritis, depression, heart disease, heart murmur    Medications: He reports he gets his medications delivered by [facility] and denies concerns with taking medications as prescribed, affording medications, side effects of medications and questions about medications   Appointments: He has seen Nathaniel Little on 02/28/18 and has visits every 3 months He is scheduled to see Nathaniel Little,  Primary MD again on April 11 2018 He reports seeing a CV Nathaniel Little and Nathaniel Little in [facility] for his pancreatitis  Advance Directives: On April 11 2018 he reports he and his daughter, Nathaniel Little will be going together to Nathaniel Little office to review the advance directive papers she provided to him in May 2019. He reports he nor Nathaniel Little understands them and welcomes assist to understand the forms  Consent: [facility] RN [facility] reviewed [facility] services ([facility] RN [facility], pharmacy, SW, NP) with patient. Patient gave verbal consent for services for [facility] SW services only.  DME has cane, CPAP, glasses    Plan: [facility] RN Little will refer Nathaniel Evitt to [facility] SW for assistance with follow up on community resources (housing, food, transportation), other resources available, financial assistance, socialization and further understanding of advance directives. He wants SW to be aware that he will not be available on July 3 (grocery shopping) , 8 (apartment search) or 9 (Nathaniel Little)  2019  Joelene Millin L. Lavina Hamman, RN, BSN, CCM [facility] Telephonic Care Management Care Coordinator Direct number [phone]  Main [facility] number [phone] Fax number [phone]

## 2018-04-05 ENCOUNTER — Other Ambulatory Visit: Payer: Self-pay | Admitting: *Deleted

## 2018-04-05 ENCOUNTER — Ambulatory Visit: Payer: Medicare Other | Admitting: Nurse Practitioner

## 2018-04-05 NOTE — Patient Outreach (Signed)
[facility] [facility]) Care Management  04/05/2018  Nathaniel Little [birth year] [id number]   Patient referred to this social worker to assist with resources to increase his socialization and to answer questions regarding his Advanced Directive. Patient called, however his voicemail was full.   Plan: This Education officer, museum will send an Economist and plan to reach back out to patient in 2-4 business letters.  Sheralyn Boatman [facility] Care Management [phone]

## 2018-04-07 ENCOUNTER — Ambulatory Visit: Payer: Self-pay | Admitting: *Deleted

## 2018-04-07 ENCOUNTER — Other Ambulatory Visit: Payer: Self-pay | Admitting: *Deleted

## 2018-04-07 ENCOUNTER — Other Ambulatory Visit: Payer: Self-pay | Admitting: Family Medicine

## 2018-04-07 DIAGNOSIS — F324 Major depressive disorder, single episode, in partial remission: Secondary | ICD-10-CM

## 2018-04-07 NOTE — Patient Outreach (Signed)
[place] [facility]) Care Management  04/07/2018  Nathaniel Little Bertha 1945-11-06 [id number]   Second phone attempt to reach patient to assist with resources to increase his socialization and to answer questions regarding completing an Advanced Directive.  Patient did not answer. Voicemail message left requesting a return call.   Sheralyn Boatman [facility] Care Management [phone]

## 2018-04-11 ENCOUNTER — Ambulatory Visit: Payer: Medicare Other | Admitting: Nurse Practitioner

## 2018-04-12 ENCOUNTER — Telehealth: Payer: Self-pay | Admitting: Nurse Practitioner

## 2018-04-12 ENCOUNTER — Ambulatory Visit: Payer: Self-pay | Admitting: *Deleted

## 2018-04-12 ENCOUNTER — Other Ambulatory Visit: Payer: Self-pay | Admitting: *Deleted

## 2018-04-12 NOTE — Patient Outreach (Signed)
[name]) Care Management  04/12/2018  [name] 1946-01-14 [id number]   Third outreach attempt to reach patient to assist with resources to increase his socialization and to answer questions regarding the completion of his Advanced Directive.   Plan: Patient to be closed to [facility] social work if there is no patient response by 04/18/18. Patient's doctor to be informed of case closure.   Sheralyn Boatman [facility] Care Management [phone]

## 2018-04-12 NOTE — Telephone Encounter (Signed)
Patient in need of assist with furniture for new home.  Will contact [place] and request assist from [employer]

## 2018-04-18 ENCOUNTER — Ambulatory Visit: Payer: Self-pay | Admitting: *Deleted

## 2018-04-19 ENCOUNTER — Other Ambulatory Visit: Payer: Self-pay | Admitting: *Deleted

## 2018-04-19 ENCOUNTER — Encounter: Payer: Self-pay | Admitting: *Deleted

## 2018-04-19 NOTE — Patient Outreach (Signed)
[place] [facility]) Care Management  04/19/2018  Nathaniel Little 03-02-1946 [id number]   Case Closure- 3 attempts made to reach patient to address increasing hi socialization and to answer questions about completing his Advanced directive.  Patient did not respond to calls or outrach letter.  Plan: Patient to be closed to [facility] care management.   Sheralyn Boatman [facility] Care Management [phone]

## 2018-04-19 NOTE — Patient Outreach (Signed)
[place] [facility]) Care Management  04/19/2018  Nathaniel Little [birth year] [id number]  Case Closure-3 attempts made to reach patient to address increasing his socialization and to answer questions about completing his Advanced Directive. Patient did not respond to calls or outreach letters.   Plan: Patient to be closed to social work at this time   [place], [place] Management [phone]

## 2018-04-21 ENCOUNTER — Encounter: Payer: Self-pay | Admitting: *Deleted

## 2018-05-01 ENCOUNTER — Encounter: Payer: Self-pay | Admitting: Nurse Practitioner

## 2018-05-01 ENCOUNTER — Other Ambulatory Visit: Payer: Self-pay

## 2018-05-01 ENCOUNTER — Ambulatory Visit (INDEPENDENT_AMBULATORY_CARE_PROVIDER_SITE_OTHER): Payer: Medicare Other | Admitting: Nurse Practitioner

## 2018-05-01 VITALS — BP 129/65 | HR 75 | Temp 97.9°F | Ht 69.0 in | Wt 270.4 lb

## 2018-05-01 DIAGNOSIS — F324 Major depressive disorder, single episode, in partial remission: Secondary | ICD-10-CM | POA: Diagnosis not present

## 2018-05-01 DIAGNOSIS — K2100 Gastro-esophageal reflux disease with esophagitis, without bleeding: Secondary | ICD-10-CM

## 2018-05-01 DIAGNOSIS — I10 Essential (primary) hypertension: Secondary | ICD-10-CM | POA: Diagnosis not present

## 2018-05-01 DIAGNOSIS — K219 Gastro-esophageal reflux disease without esophagitis: Secondary | ICD-10-CM | POA: Diagnosis not present

## 2018-05-01 DIAGNOSIS — Z1159 Encounter for screening for other viral diseases: Secondary | ICD-10-CM

## 2018-05-01 DIAGNOSIS — M7989 Other specified soft tissue disorders: Secondary | ICD-10-CM

## 2018-05-01 DIAGNOSIS — B351 Tinea unguium: Secondary | ICD-10-CM

## 2018-05-01 DIAGNOSIS — R7309 Other abnormal glucose: Secondary | ICD-10-CM | POA: Diagnosis not present

## 2018-05-01 DIAGNOSIS — Z136 Encounter for screening for cardiovascular disorders: Secondary | ICD-10-CM

## 2018-05-01 DIAGNOSIS — B353 Tinea pedis: Secondary | ICD-10-CM

## 2018-05-01 DIAGNOSIS — Z13220 Encounter for screening for lipoid disorders: Secondary | ICD-10-CM

## 2018-05-01 DIAGNOSIS — J431 Panlobular emphysema: Secondary | ICD-10-CM | POA: Diagnosis not present

## 2018-05-01 DIAGNOSIS — Z1211 Encounter for screening for malignant neoplasm of colon: Secondary | ICD-10-CM

## 2018-05-01 DIAGNOSIS — B888 Other specified infestations: Secondary | ICD-10-CM

## 2018-05-01 DIAGNOSIS — K5901 Slow transit constipation: Secondary | ICD-10-CM

## 2018-05-01 MED ORDER — MICONAZOLE NITRATE 2 % EX AERO: 1.0000 "application " | INHALATION_SPRAY | Freq: Two times a day (BID) | CUTANEOUS | 0 refills | Status: AC

## 2018-05-01 MED ORDER — BUDESONIDE-FORMOTEROL FUMARATE 160-4.5 MCG/ACT IN AERO: 2.0000 | INHALATION_SPRAY | Freq: Two times a day (BID) | RESPIRATORY_TRACT | 3 refills | Status: DC

## 2018-05-01 MED ORDER — POLYETHYLENE GLYCOL 3350 17 GM/SCOOP PO POWD: ORAL | 0 refills | Status: DC

## 2018-05-01 MED ORDER — OMEPRAZOLE 40 MG PO CPDR: 40.0000 mg | DELAYED_RELEASE_CAPSULE | Freq: Two times a day (BID) | ORAL | 3 refills | Status: DC | PRN

## 2018-05-01 MED ORDER — TRUFORM SUPPORT SOCK 8-15MMHG MISC: 2.0000 | Freq: Every day | 0 refills | Status: DC

## 2018-05-01 MED ORDER — LOSARTAN POTASSIUM 100 MG PO TABS: 100.0000 mg | ORAL_TABLET | Freq: Every day | ORAL | 1 refills | Status: DC

## 2018-05-01 MED ORDER — HYDROCHLOROTHIAZIDE 12.5 MG PO TABS: 12.5000 mg | ORAL_TABLET | Freq: Every day | ORAL | 3 refills | Status: DC

## 2018-05-01 MED ORDER — ALBUTEROL SULFATE HFA 108 (90 BASE) MCG/ACT IN AERS: 2.0000 | INHALATION_SPRAY | Freq: Four times a day (QID) | RESPIRATORY_TRACT | 0 refills | Status: DC | PRN

## 2018-05-01 MED ORDER — SOCK AID MISC: 1.0000 | Freq: Once | 0 refills | Status: AC

## 2018-05-01 MED ORDER — TIOTROPIUM BROMIDE MONOHYDRATE 18 MCG IN CAPS
18.0000 ug | ORAL_CAPSULE | Freq: Every day | RESPIRATORY_TRACT | 5 refills | Status: DC
Start: 2018-05-01 — End: 2018-11-20

## 2018-05-01 MED ORDER — BUPROPION HCL ER (XL) 300 MG PO TB24: 300.0000 mg | ORAL_TABLET | Freq: Every day | ORAL | 1 refills | Status: DC

## 2018-05-01 MED ORDER — ESCITALOPRAM OXALATE 10 MG PO TABS: 10.0000 mg | ORAL_TABLET | Freq: Every day | ORAL | 5 refills | Status: DC

## 2018-05-01 MED ORDER — CLONIDINE HCL 0.1 MG PO TABS: 0.1000 mg | ORAL_TABLET | Freq: Two times a day (BID) | ORAL | 11 refills | Status: DC

## 2018-05-01 NOTE — Patient Instructions (Addendum)
Nathaniel Little,   Thank you for coming in to clinic today.  1. START lotrimin spray 2 times daily for 14 days.  Use this on your feet.  2. START using compression socks during the day when your are standing and sitting.  Keep your feet propped up as much as possible.  3. Continue all other medications without changes.  Prescriptions were sent to [facility].  Please schedule a follow-up appointment with Cassell Smiles, AGNP. Return in about 3 months (around 08/01/2018) for hypertension, COPD.  If you have any other questions or concerns, please feel free to call the clinic or send a message through [place]. You may also schedule an earlier appointment if necessary.  You will receive a survey after today's visit either digitally by e-mail or paper by [name]. Your experiences and feedback matter to [name].  Please respond so we know how we are doing as we provide care for you.   Cassell Smiles, DNP, AGNP-BC Adult Gerontology Nurse Practitioner [facility]

## 2018-05-01 NOTE — Progress Notes (Signed)
Subjective:    Patient ID: Nathaniel Little, male    DOB: January 04, 1946, 72 y.o.   MRN: [id number]  Nathaniel Little is a 72 y.o. male presenting on 05/01/2018 for Hypertension and COPD   HPI Hypertension - He is not checking BP at home or outside of clinic.    - Current medications: losartan 100 mg once daily, tolerating well without side effects - He is not currently symptomatic. - Pt denies headache, lightheadedness, dizziness, changes in vision, palpitations, leg swelling, sudden loss of speech or loss of consciousness, chest pressure/pain. - He  reports no regular exercise routine. - His diet is moderate in salt, moderate in fat, and moderate in carbohydrates.   He has 5 meals prepared for him in his new residence per week.  He prepares his other meals himself.  COPD Patient is currently taking Symbicort bid, Spiriva daily, Albuterol - would use once weekly if he had this, but is currently out. - This morning was having a hard time with breathing r/t rushing around.  Is having fewer episodes of chest tightness with shortness of breath at his current residence.  He is not walking up stairs as frequently  GERD - Omeprazole 40 mg once daily.  - He is not currently symptomatic.  - He denies melena, hematochezia, hematemesis, and coffee ground emesis. They have tried proton pump inhibitor: omeprazole with relief. Risk factors present for GERD include tobacco abuse and obesity.  - Prior EGD with esophagitis.   Elevated Glucose No recent A1c.  Patient did not stay to have labs obtained after last visit.  Has had weight gain, increased left leg swelling, changes in lower leg skin color.  Constipation Takes daily and has nearly daily bowel movements.  Is not straining with Miralax powder. with powder, patient is having BM x 2 daily, 5 days/week.  Without powder, BM every 3 days.  Advanced Directives Patient reports he needs assistance completing his health care power of attorney and advanced  directive forms.  He was expecting his daughter to be present today, but she does not.  He verbalizes that he is not sure he wants his daughter to be his healthcare power of attorney at this time.  Social History   Tobacco Use  . Smoking status: Current Every Day Smoker    Packs/day: 0.25    Years: 68.00    Pack years: 17.00    Types: Cigarettes    Last attempt to quit: 04/01/2017    Years since quitting: 1.0  . Smokeless tobacco: Former Systems developer    Types: Chew, [place] date: 05/16/2016  . Tobacco comment: 1/2 Cigarette a day   Substance Use Topics  . Alcohol use: Yes    Alcohol/week: 0.6 oz    Types: 1 Cans of beer per week    Comment: occ  . Drug use: No    Review of Systems Per HPI unless specifically indicated above     Objective:    BP 129/65 (BP Location: Right Arm, Patient Position: Sitting, Cuff Size: Large)   Pulse 75   Temp 97.9 F (36.6 C) (Oral)   Ht 5\' 9"  (1.753 m)   Wt 270 lb 6.4 oz (122.7 kg)   BMI 39.93 kg/m   Wt Readings from Last 3 Encounters:  05/01/18 270 lb 6.4 oz (122.7 kg)  02/28/18 255 lb (115.7 kg)  12/30/17 263 lb 3.2 oz (119.4 kg)    Physical Exam  Constitutional: He is oriented to person, place, and  time. He appears well-developed and well-nourished. No distress.  HENT:  Head: Normocephalic and atraumatic.  Cardiovascular: Normal rate, regular rhythm, S1 normal, S2 normal and intact distal pulses.  Murmur heard.  Crescendo systolic murmur is present with a grade of 4/6. Pulses:      Radial pulses are 2+ on the right side, and 2+ on the left side.       Dorsalis pedis pulses are 1+ on the right side, and 2+ on the left side.       Posterior tibial pulses are 1+ on the right side, and 2+ on the left side.  LLE pitting 3+ edema RLE pitting 1+ edema Increased erythema consistent with venous stasis dermatitis  Pulmonary/Chest: Effort normal. No accessory muscle usage. No respiratory distress. He has decreased breath sounds (throughout  all lobes).  Abdominal: Soft. Bowel sounds are normal. He exhibits no distension. There is no hepatosplenomegaly. There is no tenderness. No hernia.  Rounded, distended, taut abdomen  Neurological: He is alert and oriented to person, place, and time.  Skin: Skin is warm and dry. Capillary refill takes less than 2 seconds.  Psychiatric: He has a normal mood and affect. His behavior is normal. Judgment and thought content normal.  Vitals reviewed.   Results for orders placed or performed in visit on 05/01/18  CBC with Differential/Platelet  Result Value Ref Range   WBC 7.6 3.8 - 10.8 Thousand/uL   RBC 4.46 4.20 - 5.80 Million/uL   Hemoglobin 12.1 (L) 13.2 - 17.1 g/dL   HCT 37.8 (L) 38.5 - 50.0 %   MCV 84.8 80.0 - 100.0 fL   MCH 27.1 27.0 - 33.0 pg   MCHC 32.0 32.0 - 36.0 g/dL   RDW 14.4 11.0 - 15.0 %   Platelets 259 140 - 400 Thousand/uL   MPV 10.0 7.5 - 12.5 fL   Neutro Abs 5,738 1,500 - 7,800 cells/uL   Lymphs Abs 1,034 850 - 3,900 cells/uL   WBC mixed population 578 200 - 950 cells/uL   Eosinophils Absolute 213 15 - 500 cells/uL   Basophils Absolute 38 0 - 200 cells/uL   Neutrophils Relative % 75.5 %   Total Lymphocyte 13.6 %   Monocytes Relative 7.6 %   Eosinophils Relative 2.8 %   Basophils Relative 0.5 %  COMPLETE METABOLIC PANEL WITH GFR  Result Value Ref Range   Glucose, Bld 123 (H) 65 - 99 mg/dL   BUN 23 7 - 25 mg/dL   Creat 1.13 0.70 - 1.18 mg/dL   GFR, Est Non African American 65 > OR = 60 mL/min/1.69m2   GFR, Est African American 75 > OR = 60 mL/min/1.28m2   BUN/Creatinine Ratio NOT APPLICABLE 6 - 22 (calc)   Sodium 137 135 - 146 mmol/L   Potassium 4.5 3.5 - 5.3 mmol/L   Chloride 101 98 - 110 mmol/L   CO2 27 20 - 32 mmol/L   Calcium 9.5 8.6 - 10.3 mg/dL   Total Protein 6.7 6.1 - 8.1 g/dL   Albumin 4.2 3.6 - 5.1 g/dL   Globulin 2.5 1.9 - 3.7 g/dL (calc)   AG Ratio 1.7 1.0 - 2.5 (calc)   Total Bilirubin 0.5 0.2 - 1.2 mg/dL   Alkaline phosphatase (APISO) 74 40  - 115 U/L   AST 23 10 - 35 U/L   ALT 21 9 - 46 U/L  Hemoglobin A1c  Result Value Ref Range   Hgb A1c MFr Bld 7.3 (H) <5.7 % of total Hgb  Mean Plasma Glucose 163 (calc)   eAG (mmol/L) 9.0 (calc)  Hepatitis C antibody  Result Value Ref Range   Hepatitis C Ab NON-REACTIVE NON-REACTI   SIGNAL TO CUT-OFF 0.01 <1.00  Lipid panel  Result Value Ref Range   Cholesterol 178 <200 mg/dL   HDL 43 >40 mg/dL   Triglycerides 143 <150 mg/dL   LDL Cholesterol (Calc) 109 (H) mg/dL (calc)   Total CHOL/HDL Ratio 4.1 <5.0 (calc)   Non-HDL Cholesterol (Calc) 135 (H) <130 mg/dL (calc)      Assessment & Plan:   Problem List Items Addressed This Visit      Cardiovascular and Mediastinum   Hypertension    Controlled hypertension.  BP goal < 130/80.  Pt is not working on lifestyle modifications, has had weight gain over last 3 mos.  Taking medications tolerating well without side effects. Complications include obesity  Plan: 1. Continue taking medications without changes 2. Obtain labs today  3. Encouraged heart healthy diet and increasing exercise to 30 minutes most days of the week. 4. Check BP 1-2 x per week at home, keep log, and bring to clinic at next appointment. 5. Follow up 3 months.        Relevant Medications   cloNIDine (CATAPRES) 0.1 MG tablet   hydrochlorothiazide (HYDRODIURIL) 12.5 MG tablet   losartan (COZAAR) 100 MG tablet     Respiratory   COPD (chronic obstructive pulmonary disease) (HCC) - Primary    Stable and fairly well controlled w/o requiring albuterol.  Lung sounds improved over last visit after smoking cessation achieved.  Patient has maintained smoking cessation status.  Plan: 1. Continue Symbicort and Spiriva 18 mcg once daily. 2. Continue albuterol inhaler for rescue prn. Refill provided 3. Consider repeat PFTs or pulmonology referral if worsening in future 4. Follow up 6 months or sooner if needed.      Relevant Medications   tiotropium (SPIRIVA HANDIHALER)  18 MCG inhalation capsule   budesonide-formoterol (SYMBICORT) 160-4.5 MCG/ACT inhaler   albuterol (PROVENTIL HFA;VENTOLIN HFA) 108 (90 Base) MCG/ACT inhaler     Digestive   GERD (gastroesophageal reflux disease)    Stable today on exam.  Medications tolerated without side effects.  Continue at current doses.  Refills provided.  Check labs today. Followup 3 months.       Relevant Medications   polyethylene glycol powder (GAVILAX) powder   omeprazole (PRILOSEC) 40 MG capsule   Other Relevant Orders   CBC with Differential/Platelet (Completed)     Other   Depression    Stable and continuing to improve with relocation to senior housing.   - Continue medications without changes.  Followup 6 months and prn.      Relevant Medications   buPROPion (WELLBUTRIN XL) 300 MG 24 hr tablet   escitalopram (LEXAPRO) 10 MG tablet    Other Visit Diagnoses    Fungal nail infection  Patient with long, thick fungal nails and is unable to reach them to trim.  Foot appearance consistent with vascular complications as well.  Screen DM.  Referral podiatry.  Follow-up prn.        Relevant Medications   Miconazole Nitrate 2 % AERO   Other Relevant Orders   Ambulatory referral to Podiatry   Tinea pedis of both feet     New fungal infection.  Referral to podiatry.  Use athlete's foot spray until visit for fungal infection.  Patient cannot reach feet, so cannot use ointment/lotion.  Follow-up with podiatry.   Relevant Medications  Miconazole Nitrate 2 % AERO   Leg swelling     Consistent with venous insufficiency.  START wearing compression socks.  Consider future vascular referral.  Evaluate/screen for DM.   Relevant Medications   Elastic Bandages & Supports (TRUFORM SUPPORT SOCK 8-15MMHG) MISC   Slow transit constipation     Stable on miralax.  Resume.  Refill provided.  Follow-up prn.   Relevant Medications   polyethylene glycol powder (GAVILAX) powder   Elevated glucose     Past elevated glucose.   No recent a1c screening.  Labs today.  Treat after results received.  May need to have endocrinology referral vs sooner oncology referral with presence of pancreatic cystic lesion.  Follow-up after labs and in 3 months.   Relevant Orders   COMPLETE METABOLIC PANEL WITH GFR (Completed)   Hemoglobin A1c (Completed)   Need for hepatitis C screening test       Relevant Orders   Hepatitis C antibody (Completed)   Encounter for lipid screening for cardiovascular disease       Relevant Orders   Lipid panel   Colon cancer screening       Relevant Orders   Cologuard   Infestation by bed bug     Patient reports he has seen "a few" bed bugs since his move.  He has no bugs on his clothing or under collar today.  He also has no new skin lesions from bed bug bites.  Encouraged patient to continue monitoring for them and to ask landlord for assistance to eradicate them if he sees any more.  Patient verbalizes understanding.      Meds ordered this encounter  Medications  . tiotropium (SPIRIVA HANDIHALER) 18 MCG inhalation capsule    Sig: Place 1 capsule (18 mcg total) into inhaler and inhale daily.    Dispense:  30 capsule    Refill:  5    Order Specific Question:   Supervising Provider    Answer:   Olin Hauser [[id number]]  . budesonide-formoterol (SYMBICORT) 160-4.5 MCG/ACT inhaler    Sig: Inhale 2 puffs into the lungs 2 (two) times daily.    Dispense:  1 Inhaler    Refill:  3    Order Specific Question:   Supervising Provider    Answer:   Olin Hauser [[id number]]  . albuterol (PROVENTIL HFA;VENTOLIN HFA) 108 (90 Base) MCG/ACT inhaler    Sig: Inhale 2 puffs into the lungs every 6 (six) hours as needed for wheezing or shortness of breath.    Dispense:  1 Inhaler    Refill:  0    Order Specific Question:   Supervising Provider    Answer:   Olin Hauser [[id number]]  . polyethylene glycol powder (GAVILAX) powder    Sig: TAKE 17 GMS ONCE DAILY WITH 8 OUNCES OF LIQUID     Dispense:  238 g    Refill:  0    Order Specific Question:   Supervising Provider    Answer:   Olin Hauser [[id number]]  . Foot Care Products (SOCK AID) MISC    Sig: 1 Device by Does not apply route once for 1 dose.    Dispense:  1 each    Refill:  0    Order Specific Question:   Supervising Provider    Answer:   Olin Hauser [[id number]]  . Elastic Bandages & Supports (TRUFORM SUPPORT SOCK 8-15MMHG) MISC    Sig: 2 Devices by Does not apply route daily.  Dispense:  2 each    Refill:  0    Order Specific Question:   Supervising Provider    Answer:   Olin Hauser [[id number]]  . buPROPion (WELLBUTRIN XL) 300 MG 24 hr tablet    Sig: Take 1 tablet (300 mg total) by mouth daily.    Dispense:  90 tablet    Refill:  1    Order Specific Question:   Supervising Provider    Answer:   Olin Hauser [[id number]]  . cloNIDine (CATAPRES) 0.1 MG tablet    Sig: Take 1 tablet (0.1 mg total) by mouth 2 (two) times daily.    Dispense:  60 tablet    Refill:  11    Order Specific Question:   Supervising Provider    Answer:   Olin Hauser [[id number]]  . escitalopram (LEXAPRO) 10 MG tablet    Sig: Take 1 tablet (10 mg total) by mouth daily.    Dispense:  30 tablet    Refill:  5    Order Specific Question:   Supervising Provider    Answer:   Olin Hauser [[id number]]  . hydrochlorothiazide (HYDRODIURIL) 12.5 MG tablet    Sig: Take 1 tablet (12.5 mg total) by mouth daily.    Dispense:  90 tablet    Refill:  3    Order Specific Question:   Supervising Provider    Answer:   Olin Hauser [[id number]]  . losartan (COZAAR) 100 MG tablet    Sig: Take 1 tablet (100 mg total) by mouth daily.    Dispense:  90 tablet    Refill:  1    Order Specific Question:   Supervising Provider    Answer:   Olin Hauser [[id number]]  . omeprazole (PRILOSEC) 40 MG capsule    Sig: Take 1 capsule (40 mg total) by mouth 3 times/day as needed-between meals & bedtime.     Dispense:  90 capsule    Refill:  3    Order Specific Question:   Supervising Provider    Answer:   Olin Hauser [[id number]]  . Miconazole Nitrate 2 % AERO    Sig: Apply 1 application topically 2 (two) times daily for 14 days.    Dispense:  1 Bottle    Refill:  0    Order Specific Question:   Supervising Provider    Answer:   Olin Hauser [[id number]]   A total of 45 minutes was spent face-to-face with this patient. Greater than 50% of this time (35/40 minutes) was spent in counseling and coordination of care with the patient.   Follow up plan: Return in about 3 months (around 08/01/2018) for hypertension, COPD.  Cassell Smiles, DNP, AGPCNP-BC Adult Gerontology Primary Care Nurse Practitioner [facility] 05/03/2018, 3:45 PM

## 2018-05-02 LAB — COMPLETE METABOLIC PANEL WITH GFR
AG Ratio: 1.7 (calc) (ref 1.0–2.5)
ALT: 21 U/L (ref 9–46)
AST: 23 U/L (ref 10–35)
Albumin: 4.2 g/dL (ref 3.6–5.1)
Alkaline phosphatase (APISO): 74 U/L (ref 40–115)
BUN: 23 mg/dL (ref 7–25)
CO2: 27 mmol/L (ref 20–32)
Calcium: 9.5 mg/dL (ref 8.6–10.3)
Chloride: 101 mmol/L (ref 98–110)
Creat: 1.13 mg/dL (ref 0.70–1.18)
GFR, Est African American: 75 mL/min/{1.73_m2} (ref >=60)
GFR, Est Non African American: 65 mL/min/{1.73_m2} (ref >=60)
Globulin: 2.5 g/dL (calc) (ref 1.9–3.7)
Glucose, Bld: 123 mg/dL — ABNORMAL HIGH (ref 65–99)
Potassium: 4.5 mmol/L (ref 3.5–5.3)
Sodium: 137 mmol/L (ref 135–146)
Total Bilirubin: 0.5 mg/dL (ref 0.2–1.2)
Total Protein: 6.7 g/dL (ref 6.1–8.1)

## 2018-05-02 LAB — CBC WITH DIFFERENTIAL/PLATELET
Basophils Absolute: 38 cells/uL (ref 0–200)
Basophils Relative: 0.5 %
Eosinophils Absolute: 213 cells/uL (ref 15–500)
Eosinophils Relative: 2.8 %
HCT: 37.8 % — ABNORMAL LOW (ref 38.5–50.0)
Hemoglobin: 12.1 g/dL — ABNORMAL LOW (ref 13.2–17.1)
Lymphs Abs: 1034 cells/uL (ref 850–3900)
MCH: 27.1 pg (ref 27.0–33.0)
MCHC: 32 g/dL (ref 32.0–36.0)
MCV: 84.8 fL (ref 80.0–100.0)
MPV: 10 fL (ref 7.5–12.5)
Monocytes Relative: 7.6 %
Neutro Abs: 5738 cells/uL (ref 1500–7800)
Neutrophils Relative %: 75.5 %
Platelets: 259 10*3/uL (ref 140–400)
RBC: 4.46 10*6/uL (ref 4.20–5.80)
RDW: 14.4 % (ref 11.0–15.0)
Total Lymphocyte: 13.6 %
WBC mixed population: 578 cells/uL (ref 200–950)
WBC: 7.6 10*3/uL (ref 3.8–10.8)

## 2018-05-02 LAB — HEMOGLOBIN A1C
Hgb A1c MFr Bld: 7.3 % of total Hgb — ABNORMAL HIGH (ref <5.7)
Mean Plasma Glucose: 163 (calc)
eAG (mmol/L): 9 (calc)

## 2018-05-02 LAB — LIPID PANEL
Cholesterol: 178 mg/dL (ref <200)
HDL: 43 mg/dL (ref >40)
LDL Cholesterol (Calc): 109 mg/dL (calc) — ABNORMAL HIGH
Non-HDL Cholesterol (Calc): 135 mg/dL (calc) — ABNORMAL HIGH (ref <130)
Total CHOL/HDL Ratio: 4.1 (calc) (ref <5.0)
Triglycerides: 143 mg/dL (ref <150)

## 2018-05-02 LAB — HEPATITIS C ANTIBODY
Hepatitis C Ab: NONREACTIVE
SIGNAL TO CUT-OFF: 0.01 (ref <1.00)

## 2018-05-03 ENCOUNTER — Encounter: Payer: Self-pay | Admitting: Nurse Practitioner

## 2018-05-03 NOTE — Assessment & Plan Note (Signed)
Stable and continuing to improve with relocation to senior housing.   - Continue medications without changes.  Followup 6 months and prn.

## 2018-05-03 NOTE — Assessment & Plan Note (Signed)
Controlled hypertension.  BP goal < 130/80.  Pt is not working on lifestyle modifications, has had weight gain over last 3 mos.  Taking medications tolerating well without side effects. Complications include obesity  Plan: 1. Continue taking medications without changes 2. Obtain labs today  3. Encouraged heart healthy diet and increasing exercise to 30 minutes most days of the week. 4. Check BP 1-2 x per week at home, keep log, and bring to clinic at next appointment. 5. Follow up 3 months.

## 2018-05-03 NOTE — Assessment & Plan Note (Signed)
Stable today on exam.  Medications tolerated without side effects.  Continue at current doses.  Refills provided.  Check labs today. Followup 3 months.

## 2018-05-03 NOTE — Assessment & Plan Note (Signed)
Stable and fairly well controlled w/o requiring albuterol.  Lung sounds improved over last visit after smoking cessation achieved.  Patient has maintained smoking cessation status.  Plan: 1. Continue Symbicort and Spiriva 18 mcg once daily. 2. Continue albuterol inhaler for rescue prn. Refill provided 3. Consider repeat PFTs or pulmonology referral if worsening in future 4. Follow up 6 months or sooner if needed.

## 2018-05-08 ENCOUNTER — Other Ambulatory Visit: Payer: Self-pay | Admitting: Nurse Practitioner

## 2018-05-08 DIAGNOSIS — K2100 Gastro-esophageal reflux disease with esophagitis, without bleeding: Secondary | ICD-10-CM

## 2018-05-11 ENCOUNTER — Other Ambulatory Visit: Payer: Self-pay | Admitting: *Deleted

## 2018-05-11 ENCOUNTER — Encounter: Payer: Self-pay | Admitting: *Deleted

## 2018-05-11 NOTE — Patient Outreach (Signed)
[facility]) Care Management  05/11/2018  Nathaniel Little January 21, 1946 [id number]   This social worker received a return call from patient after he received the closure letter due to being unable to contact patient. This social worker introduced [facility] care management and the reson for previous referral.  Per patient, he is now living in [facility] and was able to work with the practice navigator to get his apartment furnished with the assistance of the [employer].  Per patient, he is very appreciative of the assistance received and verbalized having no additional community resource needs at this time. Patient encouraged to call this social worker back if there are any issues or concerns in the future.   Sheralyn Boatman [facility] Care Management [phone]

## 2018-05-15 ENCOUNTER — Other Ambulatory Visit: Payer: Self-pay | Admitting: *Deleted

## 2018-05-15 NOTE — Patient Outreach (Signed)
[facility]) Care Management  05/15/2018  Nathaniel Little 1946-09-22 [id number]   Care coordination   [facility] RN CM received on 05/15/18 a voice message left by [name] inquiring about the contact number for a [facility] RN CM is not familiar with [facility] (attempt to goggle it came back with uncertain results) [facility] RN CM returned a call to [name] to assess more but received his voice message RN CM left a voice message informing him CM did not know the number of [facility] and referred him to [facility] SW whom assisted him. [facility] RN CM left CM mobile contact number for any possible return call/assistance  Kimberly L. Lavina Hamman, RN, BSN, [place] Coordinator Office number [phone] Mobile number [phone]  Main [facility] number [phone] Fax number [phone]

## 2018-05-16 ENCOUNTER — Encounter: Payer: Self-pay | Admitting: Gastroenterology

## 2018-05-16 ENCOUNTER — Telehealth: Payer: Self-pay | Admitting: Nurse Practitioner

## 2018-05-16 DIAGNOSIS — E1165 Type 2 diabetes mellitus with hyperglycemia: Secondary | ICD-10-CM

## 2018-05-16 MED ORDER — METFORMIN HCL 500 MG PO TABS: ORAL_TABLET | ORAL | 0 refills | Status: DC

## 2018-05-16 NOTE — Telephone Encounter (Signed)
Pt called saying he received a letter from his PCP about some blood work? Wants to discuss with provider, requesting a call back.

## 2018-05-16 NOTE — Telephone Encounter (Signed)
Attempted to call patient. Unable to connect.  Did not reach voicemail.   Patient returned call to office immediately.  Discussed new diabetes diagnosis with patient.  Will need to continue close monitoring for his pancreas lesion.  Next appointment at [facility] with oncology is in September 2019. - Metformin sent to [employer]. Start metformin 500 mg bid.  Start w/ 500 mg once daily with breakfast x 2 weeks, then increase to 500 mg twice daily with breakfast and dinner.  Discussed common side effects to include diarrhea, gas, liver enzyme elevation.   Anemia: Patient is having some blood in BM, makers toilet water red about 3 times per week.  Patient states it is a small amount of blood.   Patient has been seen by Dr. Vicente Males at [place].  Phone number provided to patient.  Requested he call and schedule an appointment for his blood loss in stool.

## 2018-05-17 ENCOUNTER — Telehealth: Payer: Self-pay

## 2018-05-17 NOTE — Telephone Encounter (Signed)
LVM for pt to contact office for an appt with Dr. Vicente Males.  Present appt is 06/22/18 at 2:45.  We will move this appt up to a much sooner date when he returns our call.  Thanks [employer]

## 2018-05-17 NOTE — Telephone Encounter (Signed)
Nathaniel Little   Can you have the patient see me at the office, cbc if not done recently prior to visit please   C/c [name], NP

## 2018-05-22 NOTE — Telephone Encounter (Signed)
This encounter was created in error - please disregard.

## 2018-05-28 DIAGNOSIS — Z1212 Encounter for screening for malignant neoplasm of rectum: Secondary | ICD-10-CM | POA: Diagnosis not present

## 2018-05-28 DIAGNOSIS — Z1211 Encounter for screening for malignant neoplasm of colon: Secondary | ICD-10-CM | POA: Diagnosis not present

## 2018-05-28 LAB — COLOGUARD: COLOGUARD: POSITIVE

## 2018-06-07 ENCOUNTER — Ambulatory Visit (INDEPENDENT_AMBULATORY_CARE_PROVIDER_SITE_OTHER): Payer: Medicare Other

## 2018-06-07 ENCOUNTER — Telehealth: Payer: Self-pay | Admitting: Nurse Practitioner

## 2018-06-07 ENCOUNTER — Encounter: Payer: Self-pay | Admitting: Podiatry

## 2018-06-07 ENCOUNTER — Ambulatory Visit (INDEPENDENT_AMBULATORY_CARE_PROVIDER_SITE_OTHER): Payer: Medicare Other | Admitting: Podiatry

## 2018-06-07 DIAGNOSIS — E119 Type 2 diabetes mellitus without complications: Secondary | ICD-10-CM | POA: Diagnosis not present

## 2018-06-07 DIAGNOSIS — L603 Nail dystrophy: Secondary | ICD-10-CM | POA: Diagnosis not present

## 2018-06-07 DIAGNOSIS — Q828 Other specified congenital malformations of skin: Secondary | ICD-10-CM | POA: Diagnosis not present

## 2018-06-07 DIAGNOSIS — R195 Other fecal abnormalities: Secondary | ICD-10-CM

## 2018-06-07 DIAGNOSIS — M79672 Pain in left foot: Secondary | ICD-10-CM | POA: Diagnosis not present

## 2018-06-07 DIAGNOSIS — M79671 Pain in right foot: Secondary | ICD-10-CM

## 2018-06-07 NOTE — Progress Notes (Signed)
Subjective:  Patient ID: Nathaniel Little, male    DOB: 1945-12-04,  MRN: [id number] HPI Chief Complaint  Patient presents with  . Nail Problem    patient presents today for nail trim and diabetic foot care.  He c/o nail fungus most toes bilat feet and painful callous on bottom of left forefoot  . Callouses  . Diabetes    72 y.o. male presents with the above complaint.    ROS: Denies fever chills nausea vomiting muscle aches pains calf pain back pain chest pain shortness of breath.  Past Medical History:  Diagnosis Date  . Arthritis   . Cancer ([place]) 03/2013   Prostate  . COPD (chronic obstructive pulmonary disease) ([place])   . Depression   . Heart disease   . Heart murmur   . Hypertension   . Lymphedema   . Sleep apnea with use of continuous positive airway pressure (CPAP)    Past Surgical History:  Procedure Laterality Date  . ADENOIDECTOMY    . APPENDECTOMY    . EUS N/A 10/13/2017   Procedure: FULL UPPER ENDOSCOPIC ULTRASOUND (EUS) RADIAL;  Surgeon: Holly Bodily, MD;  Location[facility] ENDOSCOPY;  Service: Gastroenterology;  Laterality: N/A;  . TONSILLECTOMY      Current Outpatient Medications:  .  ipratropium (ATROVENT) 0.02 % nebulizer solution, Take 2.5 mLs (0.5 mg total) by nebulization every 6 (six) hours as needed for wheezing or shortness of breath., Disp: , Rfl:  .  albuterol (PROVENTIL HFA;VENTOLIN HFA) 108 (90 Base) MCG/ACT inhaler, Inhale 2 puffs into the lungs every 6 (six) hours as needed for wheezing or shortness of breath., Disp: 1 Inhaler, Rfl: 0 .  aspirin 81 MG tablet, Take 81 mg by mouth daily., Disp: , Rfl:  .  budesonide-formoterol (SYMBICORT) 160-4.5 MCG/ACT inhaler, Inhale 2 puffs into the lungs 2 (two) times daily., Disp: 1 Inhaler, Rfl: 3 .  buPROPion (WELLBUTRIN XL) 300 MG 24 hr tablet, Take 1 tablet (300 mg total) by mouth daily., Disp: 90 tablet, Rfl: 1 .  cloNIDine (CATAPRES) 0.1 MG tablet, Take 1 tablet (0.1 mg total) by mouth 2 (two) times  daily., Disp: 60 tablet, Rfl: 11 .  Elastic Bandages & Supports (TRUFORM SUPPORT SOCK 8-15MMHG) MISC, 2 Devices by Does not apply route daily., Disp: 2 each, Rfl: 0 .  escitalopram (LEXAPRO) 10 MG tablet, Take 1 tablet (10 mg total) by mouth daily., Disp: 30 tablet, Rfl: 5 .  hydrochlorothiazide (HYDRODIURIL) 12.5 MG tablet, Take 1 tablet (12.5 mg total) by mouth daily., Disp: 90 tablet, Rfl: 3 .  ipratropium-albuterol (DUONEB) 0.5-2.5 (3) MG/3ML SOLN, Take 3 mLs by nebulization every 6 (six) hours as needed (wheezing, shortness of breath)., Disp: 360 mL, Rfl: 3 .  losartan (COZAAR) 100 MG tablet, Take 1 tablet (100 mg total) by mouth daily., Disp: 90 tablet, Rfl: 1 .  metFORMIN (GLUCOPHAGE) 500 MG tablet, Take 1 tablet (500 mg total) by mouth daily with breakfast for 14 days, THEN 1 tablet (500 mg total) 2 (two) times daily with a meal., Disp: 166 tablet, Rfl: 0 .  omeprazole (PRILOSEC) 40 MG capsule, Take 1 capsule (40 mg total) by mouth 3 times/day as needed-between meals & bedtime., Disp: 90 capsule, Rfl: 3 .  polyethylene glycol powder (GAVILAX) powder, TAKE 17 GMS ONCE DAILY WITH 8 OUNCES OF LIQUID, Disp: 238 g, Rfl: 0 .  tiotropium (SPIRIVA HANDIHALER) 18 MCG inhalation capsule, Place 1 capsule (18 mcg total) into inhaler and inhale daily., Disp: 30 capsule, Rfl: 5  Allergies  Allergen Reactions  . Codeine Itching   Review of Systems Objective:  There were no vitals filed for this visit.  General: Well developed, nourished, in no acute distress, alert and oriented x3   Dermatological: Skin is warm, dry and supple bilateral. Nails x 10 are well maintained; remaining integument appears unremarkable at this time. There are no open sores, no preulcerative lesions, no rash or signs of infection present.  Toenails are long thick yellow dystrophic-like mycotic painful palpation as well as debridement.  Reactive hyper keratoma sub-fourth metatarsal head of the left foot does not demonstrate any  open lesions or wounds.    Vascular: Dorsalis Pedis artery and Posterior Tibial artery pedal pulses are 2/4 bilateral with immedate capillary fill time. Pedal hair growth present. No varicosities and no lower extremity edema present bilateral.   Neruologic: Grossly intact via light touch bilateral. Vibratory intact via tuning fork bilateral. Protective threshold with Semmes Wienstein monofilament intact to all pedal sites bilateral. Patellar and Achilles deep tendon reflexes 2+ bilateral. No Babinski or clonus noted bilateral.   Musculoskeletal: No gross boney pedal deformities bilateral. No pain, crepitus, or limitation noted with foot and ankle range of motion bilateral. Muscular strength 5/5 in all groups tested bilateral.  Gait: Unassisted, Nonantalgic.    Radiographs:  Radiographs demonstrate osseously mature individual cavus foot deformity and severe hammertoe deformities bilateral.  Osteoarthritic changes midfoot and forefoot bilaterally.  No acute findings.    Assessment & Plan:   Assessment: Pain in limb secondary to onychomycosis and porokeratosis bilateral.  Plan: Debridement of toenails 1 through 5 bilateral.  Debridement of porokeratotic lesion.  Follow-up with Dr. Prudence Davidson for routine debridement.     Max T. [place], [place]

## 2018-06-07 NOTE — Telephone Encounter (Signed)
Left message to return call about results.    When patient returns call: - I need to discuss positive cologuard.  Referral recommended back to Dr. Vicente Males for colonoscopy to evaluate cause.

## 2018-06-08 ENCOUNTER — Ambulatory Visit: Payer: Medicare Other | Admitting: Gastroenterology

## 2018-06-12 DIAGNOSIS — H2511 Age-related nuclear cataract, right eye: Secondary | ICD-10-CM | POA: Diagnosis not present

## 2018-06-12 LAB — HM DIABETES EYE EXAM

## 2018-06-12 NOTE — Telephone Encounter (Signed)
Patient informed about results via phone.  He returned our call.  Results shared.  Patient ok to proceed wit h colonoscopy.  Patient also shared he is scheduled for cataract surgery.  Cancel letter to be mailed.

## 2018-06-12 NOTE — Telephone Encounter (Signed)
I have attempted to call patient on 3 different days without response.  Will mail copy of these results to patient.

## 2018-06-16 ENCOUNTER — Ambulatory Visit (INDEPENDENT_AMBULATORY_CARE_PROVIDER_SITE_OTHER): Payer: Medicare Other | Admitting: Gastroenterology

## 2018-06-16 ENCOUNTER — Encounter: Payer: Self-pay | Admitting: Gastroenterology

## 2018-06-16 ENCOUNTER — Other Ambulatory Visit: Payer: Self-pay

## 2018-06-16 VITALS — BP 122/70 | HR 79 | Ht 69.0 in | Wt 266.6 lb

## 2018-06-16 DIAGNOSIS — R195 Other fecal abnormalities: Secondary | ICD-10-CM

## 2018-06-16 DIAGNOSIS — K869 Disease of pancreas, unspecified: Secondary | ICD-10-CM | POA: Diagnosis not present

## 2018-06-16 DIAGNOSIS — K5900 Constipation, unspecified: Secondary | ICD-10-CM | POA: Diagnosis not present

## 2018-06-16 NOTE — Progress Notes (Signed)
Nathaniel Bellows MD, MRCP(U.K) [address]  [place]  [place], [place] [address]  Main: [phone]  Fax: [phone]   Primary Care Physician: [name], NP  Primary Gastroenterologist:  Dr. Jonathon Little   Chief Complaint  Patient presents with  . Follow-up    positive cologuard, blood in stool    HPI: Nathaniel Little is a 72 y.o. male      Summary of history :  Nathaniel Little here to follow up for  Pancreatic lesion ,He  underwent a CT scan of the abdomen recently that showed an abnormal appearance of the pancreas with several large calculi along the dorsal pancreatic duct, prominent dilated segment of the dorsal pancreatic duct in the pancreatic body and a cystic mass along the upper margin of the pancreatic body measuring up to 3.6 cm with internal septations. Concern was for cystic serous neoplasm. An MRI of the abdomen showed a multiseptated cystic lesions in the pancreatic head and body similar to features seen on recent CT scan.  The MRI suggested further EUS evaluation with a short-term follow-up. CMP showed no abnormalities of his transaminases, albumin was 4.1, total bilirubin 0.7 hemoglobin of 15.4. Denied any weight loss, no family history or personal history of pancreatitis or pancreatic cancer. Denied any abdominal pain . He is a smoker. Underwent EUS 10/13/17 : FNA of lesion of the pancreas CEA 1166, PANCREATIC HEAD CYST; EUS-GUIDED FINE-NEEDLE ASPIRATION:  ATYPICAL/INCONCLUSIVE.  MUCINOUS EPITHELIUM WITH FOCAL ATYPIA   His results were discussed by Dr Janese Banks at the cancer MDT meeting at [facility]  and consensus was that he be referred for surgical evaluation to [facility].    Interval history   11/17/2016 -06/16/18   Was evaluated at [facility] surgical oncology and felt it was better to undergo surveillance with MRI every 6 months and no surgery at that time in 12/2017 .    Today he has been referred for a positive cologuard test . Hb 12.1 grams with MCV of  84.8 . Last colonoscopy many years back. Denies any blood in his stool. At times he has seen some blood in his stool . Weight up and down , no diarrhea, some constipation and takes miralax  Current Outpatient Medications  Medication Sig Dispense Refill  . albuterol (PROVENTIL HFA;VENTOLIN HFA) 108 (90 Base) MCG/ACT inhaler Inhale 2 puffs into the lungs every 6 (six) hours as needed for wheezing or shortness of breath. 1 Inhaler 0  . aspirin 81 MG tablet Take 81 mg by mouth daily.    . budesonide-formoterol (SYMBICORT) 160-4.5 MCG/ACT inhaler Inhale 2 puffs into the lungs 2 (two) times daily. 1 Inhaler 3  . buPROPion (WELLBUTRIN XL) 300 MG 24 hr tablet Take 1 tablet (300 mg total) by mouth daily. 90 tablet 1  . cloNIDine (CATAPRES) 0.1 MG tablet Take 1 tablet (0.1 mg total) by mouth 2 (two) times daily. 60 tablet 11  . Elastic Bandages & Supports (TRUFORM SUPPORT SOCK 8-15MMHG) MISC 2 Devices by Does not apply route daily. 2 each 0  . escitalopram (LEXAPRO) 10 MG tablet Take 1 tablet (10 mg total) by mouth daily. 30 tablet 5  . hydrochlorothiazide (HYDRODIURIL) 12.5 MG tablet Take 1 tablet (12.5 mg total) by mouth daily. 90 tablet 3  . ipratropium (ATROVENT) 0.02 % nebulizer solution Take 2.5 mLs (0.5 mg total) by nebulization every 6 (six) hours as needed for wheezing or shortness of breath.    [name] ipratropium-albuterol (DUONEB) 0.5-2.5 (3) MG/3ML SOLN Take 3 mLs by  nebulization every 6 (six) hours as needed (wheezing, shortness of breath). 360 mL 3  . losartan (COZAAR) 100 MG tablet Take 1 tablet (100 mg total) by mouth daily. 90 tablet 1  . metFORMIN (GLUCOPHAGE) 500 MG tablet Take 1 tablet (500 mg total) by mouth daily with breakfast for 14 days, THEN 1 tablet (500 mg total) 2 (two) times daily with a meal. 166 tablet 0  . omeprazole (PRILOSEC) 40 MG capsule Take 1 capsule (40 mg total) by mouth 3 times/day as needed-between meals & bedtime. 90 capsule 3  . polyethylene glycol powder (GAVILAX)  powder TAKE 17 GMS ONCE DAILY WITH 8 OUNCES OF LIQUID 238 g 0  . tiotropium (SPIRIVA HANDIHALER) 18 MCG inhalation capsule Place 1 capsule (18 mcg total) into inhaler and inhale daily. 30 capsule 5   No current facility-administered medications for this visit.     Allergies as of 06/16/2018 - Review Complete 06/07/2018  Allergen Reaction Noted  . Codeine Itching 05/23/2016    ROS:  General: Negative for anorexia, weight loss, fever, chills, fatigue, weakness. ENT: Negative for hoarseness, difficulty swallowing , nasal congestion. CV: Negative for chest pain, angina, palpitations, dyspnea on exertion, peripheral edema.  Respiratory: Negative for dyspnea at rest, dyspnea on exertion, cough, sputum, wheezing.  GI: See history of present illness. GU:  Negative for dysuria, hematuria, urinary incontinence, urinary frequency, nocturnal urination.  Endo: Negative for unusual weight change.    Physical Examination:   Ht 5\' 9"  (1.753 m)   Wt 266 lb 9.6 oz (120.9 kg)   BMI 39.37 kg/m   General: Well-nourished, well-developed in no acute distress.  Eyes: No icterus. Conjunctivae pink. Mouth: Oropharyngeal mucosa moist and pink , no lesions erythema or exudate. Lungs: Clear to auscultation bilaterally. Non-labored. Heart: Regular rate and rhythm, systolic murmur in aortic area, no  rubs or gallops.  Abdomen: Bowel sounds are normal, nontender, nondistended, no hepatosplenomegaly or masses, no abdominal bruits or hernia , no rebound or guarding.   Extremities: No lower extremity edema. No clubbing or deformities. Neuro: Alert and oriented x 3.  Grossly intact. Skin: Warm and dry, no jaundice.   Psych: Alert and cooperative, normal mood and affect.   Imaging Studies: Dg Foot Complete Left  Result Date: 06/07/2018 Please see detailed radiograph report in office note.  Dg Foot Complete Right  Result Date: 06/07/2018 Please see detailed radiograph report in office note.   Assessment  and Plan:   Nathaniel Little is a 72 y.o. y/o male here to see me today for a positive cologuard test . He has a history of a  cystic septated lesion seen   on CT/MRI,FNA highly suggestive of FNA with atypical cells . Discussed at [facility] disciplinary meeting and decision was to refer for surgery -possible whipples. At [facility], surgical oncology felt risky for surgery and suggested surveillance with MRI q 6 monthly . He says he has an appointment with [facility] next week .Has some constipation and is on vicodin   Plan  1. Diagnostic colonoscopy.  2.  Amitiza 8 mcg  for 2 weeks samples provided  I have discussed alternative options, risks & benefits,  which include, but are not limited to, bleeding, infection, perforation,respiratory complication & drug reaction.  The patient agrees with this plan & written consent will be obtained.     Dr Nathaniel Bellows  MD,MRCP [facility]) Follow up in 8 weeks

## 2018-06-20 DIAGNOSIS — K862 Cyst of pancreas: Secondary | ICD-10-CM | POA: Diagnosis not present

## 2018-06-20 DIAGNOSIS — D490 Neoplasm of unspecified behavior of digestive system: Secondary | ICD-10-CM | POA: Diagnosis not present

## 2018-06-20 DIAGNOSIS — R6889 Other general symptoms and signs: Secondary | ICD-10-CM | POA: Diagnosis not present

## 2018-06-21 ENCOUNTER — Telehealth: Payer: Self-pay | Admitting: Cardiovascular Disease

## 2018-06-21 NOTE — Telephone Encounter (Signed)
Left voicemail message to call back  

## 2018-06-21 NOTE — Telephone Encounter (Signed)
Patient will be having pancreas surgery soon Patient would like to speak with nurse about whether he will need an appointment for clearance Please call to discuss

## 2018-06-22 ENCOUNTER — Ambulatory Visit: Payer: Medicare Other | Admitting: Gastroenterology

## 2018-06-23 NOTE — Telephone Encounter (Signed)
No answer. Left message to call back.   

## 2018-06-26 NOTE — Telephone Encounter (Signed)
Went straight to VM. Left message to call back if he still has any issues to discuss.

## 2018-06-27 ENCOUNTER — Telehealth: Payer: Self-pay | Admitting: Gastroenterology

## 2018-06-27 NOTE — Telephone Encounter (Signed)
PT is calling to reschedule his procedure pt will call us back when he knows when he can come he doesnt have a ride

## 2018-06-28 ENCOUNTER — Ambulatory Visit: Admission: RE | Admit: 2018-06-28 | Payer: Medicare Other | Source: Ambulatory Visit | Admitting: Gastroenterology

## 2018-06-28 ENCOUNTER — Encounter: Admission: RE | Payer: Self-pay | Source: Ambulatory Visit

## 2018-06-28 SURGERY — COLONOSCOPY WITH PROPOFOL
Anesthesia: General

## 2018-07-05 DIAGNOSIS — K219 Gastro-esophageal reflux disease without esophagitis: Secondary | ICD-10-CM | POA: Diagnosis not present

## 2018-07-05 DIAGNOSIS — I10 Essential (primary) hypertension: Secondary | ICD-10-CM | POA: Diagnosis not present

## 2018-07-05 DIAGNOSIS — Z01818 Encounter for other preprocedural examination: Secondary | ICD-10-CM | POA: Diagnosis not present

## 2018-07-05 DIAGNOSIS — I350 Nonrheumatic aortic (valve) stenosis: Secondary | ICD-10-CM | POA: Diagnosis not present

## 2018-07-05 DIAGNOSIS — E119 Type 2 diabetes mellitus without complications: Secondary | ICD-10-CM | POA: Diagnosis not present

## 2018-07-05 DIAGNOSIS — J449 Chronic obstructive pulmonary disease, unspecified: Secondary | ICD-10-CM | POA: Diagnosis not present

## 2018-07-05 DIAGNOSIS — R6889 Other general symptoms and signs: Secondary | ICD-10-CM | POA: Diagnosis not present

## 2018-07-05 DIAGNOSIS — D490 Neoplasm of unspecified behavior of digestive system: Secondary | ICD-10-CM | POA: Diagnosis not present

## 2018-07-06 ENCOUNTER — Telehealth: Payer: Self-pay | Admitting: Nurse Practitioner

## 2018-07-06 ENCOUNTER — Telehealth: Payer: Self-pay | Admitting: Gastroenterology

## 2018-07-06 NOTE — Telephone Encounter (Signed)
Very unlikely that patient will be discharged from the hospital and able to independently travel by 29th for Whipple procedure on 17th.  Please cancel this appointment and await hospital followup visit after discharge.

## 2018-07-06 NOTE — Telephone Encounter (Signed)
The pt want to notify you that he's scheduled for Whipple surgery on Oct 17th. He's unsure if he will be able to keep his Oct 29th appt. I informed the patient that he can call us 24-48 hrs before to reschedule if he's unable to come in. He verbalize understanding.

## 2018-07-06 NOTE — Telephone Encounter (Signed)
Pt may not make if Oct 29th appt because he is having surgery on the 17th.  His call back number is [phone]

## 2018-07-07 NOTE — Telephone Encounter (Signed)
The pt phone number is disconnected. He was instructed to call 48 hrs prior to cancel the appointment if unable to come in during the previous conversation.

## 2018-07-11 NOTE — Telephone Encounter (Signed)
error 

## 2018-07-13 ENCOUNTER — Encounter: Payer: Self-pay | Admitting: Nurse Practitioner

## 2018-07-13 ENCOUNTER — Ambulatory Visit: Payer: Medicare Other | Admitting: Nurse Practitioner

## 2018-07-13 VITALS — BP 130/80 | HR 84 | Ht 69.0 in | Wt 269.2 lb

## 2018-07-13 DIAGNOSIS — I5032 Chronic diastolic (congestive) heart failure: Secondary | ICD-10-CM | POA: Diagnosis not present

## 2018-07-13 DIAGNOSIS — Z720 Tobacco use: Secondary | ICD-10-CM

## 2018-07-13 DIAGNOSIS — I10 Essential (primary) hypertension: Secondary | ICD-10-CM

## 2018-07-13 DIAGNOSIS — Z01810 Encounter for preprocedural cardiovascular examination: Secondary | ICD-10-CM

## 2018-07-13 DIAGNOSIS — I350 Nonrheumatic aortic (valve) stenosis: Secondary | ICD-10-CM

## 2018-07-13 NOTE — Patient Instructions (Addendum)
Medication Instructions:  Your physician recommends that you continue on your current medications as directed. Please refer to the Current Medication list given to you today.   If you need a refill on your cardiac medications before your next appointment, please call your pharmacy.   Lab work: None ordered   If you have labs (blood work) drawn today and your tests are completely normal, you will receive your results only by: [name] MyChart Message (if you have MyChart) OR . A paper copy in the mail If you have any lab test that is abnormal or we need to change your treatment, we will call you to review the results.   Testing/Procedures: Your physician has requested that you have an echocardiogram. Echocardiography is a painless test that uses sound waves to create images of your heart. It provides your doctor with information about the size and shape of your heart and how well your heart's chambers and valves are working. This procedure takes approximately one hour. There are no restrictions for this procedure.     Follow-Up: At [facility], you and your health needs are our priority.  As part of our continuing mission to provide you with exceptional heart care, we have created designated Provider Care Teams.  These Care Teams include your primary Cardiologist (physician) and Advanced Practice Providers (APPs -  Physician Assistants and Nurse Practitioners) who all work together to provide you with the care you need, when you need it. You will need a follow up appointment in 3 months.  Please call our office 2 months in advance to schedule this appointment.  You may see Ida Rogue, MD or one of the following Advanced Practice Providers on your designated Care Team:   Murray Hodgkins, NP Christell Faith, PA-C Marrianne Mood, PA-C   Any Other Special Instructions Will Be Listed Below (If Applicable).

## 2018-07-13 NOTE — Progress Notes (Signed)
Office Visit    Patient Name: Nathaniel Little Date of Encounter: 07/13/2018  Primary Care Provider:  [name], NP Primary Cardiologist:  Ida Rogue, MD  Chief Complaint    72 year old male with a history of obesity, tobacco abuse, sleep apnea on CPAP, hypertension, moderate aortic stenosis, COPD, prostate cancer, and pancreatic mass concerning for cancer now pending [facility], who presents for follow-up and preoperative evaluation.  Past Medical History    Past Medical History:  Diagnosis Date  . (HFpEF) heart failure with preserved ejection fraction ([place])    a. 08/2017 Echo: EF 60-65%, Gr1 DD.  [name] Arthritis   . Chest pain    a. 08/2017 Lexiscan MV: EF 53%, no ischemia-->Low risk.  [name] COPD (chronic obstructive pulmonary disease) ([place])   . Depression   . Heart murmur   . Hypertension   . Lymphedema   . Moderate aortic stenosis    a. 08/2017 Echo: EF 60-65%, no rwma, GR1 DD, Mod Ao Stenosis, mean grad 47mHg. Valve area (VTI): 1.04 cm^2. Mildly dil LA.  . Morbid obesity ([place])   . Prostate cancer ([place]) 03/2013  . Sleep apnea with use of continuous positive airway pressure (CPAP)   . Tobacco abuse    Past Surgical History:  Procedure Laterality Date  . ADENOIDECTOMY    . APPENDECTOMY    . EUS N/A 10/13/2017   Procedure: FULL UPPER ENDOSCOPIC ULTRASOUND (EUS) RADIAL;  Surgeon: Holly Bodily, MD;  Location: [facility] ENDOSCOPY;  Service: Gastroenterology;  Laterality: N/A;  . TONSILLECTOMY      Allergies  Allergies  Allergen Reactions  . Codeine Itching    History of Present Illness    72 year old male with the above complex past medical history including obesity, tobacco abuse, COPD, sleep apnea on CPAP, hypertension, moderate aortic stenosis, prostate cancer, and pancreatic mass concerning for cancer.  He was evaluated by Dr. Rockey Situ previously in the fall 2018 in the setting of complaints of intermittent exertional chest discomfort as well as exertional  dyspnea.  Stress testing was undertaken and showed no evidence of ischemia.  He also was noted to have a systolic murmur and an echocardiogram showed an moderate aortic stenosis with normal LV function.  Grade 1 diastolic dysfunction was also noted.  He has been followed by GI and also surgical oncology at [facility] in the setting of a pancreatic mass.  Fine-needle aspiration earlier this year showed mucinous epithelium with focal atypia.  He has been diagnosed with intra-papillary mucosal neoplasms and is tentatively scheduled for pancreatectomy October 17 at [facility].  He presents today for preoperative evaluation.  Patient says that over the past 6 months, he has been experiencing low swelling.  This is especially morning sitting around with his legs in a dependent position.  He has chronic stable dyspnea on exertion and says he can walk somewhere between 4 and 8 blocks prior to having to take a rest.  His rest is mostly predicated on the amount of discomfort he has numbness of the right hip related to arthritis, though he also experiences dyspnea and sometimes, maybe about once a month, fleeting exertional chest discomfort.  Overall, he feels as though his chest discomfort and dyspnea are stable since a year ago, when he underwent echo and stress test he denies PND, orthopnea, dizziness, syncope, or early satiety.  Home Medications    Prior to Admission medications   Medication Sig Start Date End Date Taking? Authorizing Provider  albuterol (PROVENTIL HFA;VENTOLIN HFA) 108 (90 Base)  MCG/ACT inhaler Inhale 2 puffs into the lungs every 6 (six) hours as needed for wheezing or shortness of breath. 05/01/18   [name], NP  aspirin 81 MG tablet Take 81 mg by mouth daily.    [provider]  budesonide-formoterol (SYMBICORT) 160-4.5 MCG/ACT inhaler Inhale 2 puffs into the lungs 2 (two) times daily. 05/01/18   [name], NP  buPROPion (WELLBUTRIN XL) 300 MG 24 hr tablet Take 1 tablet  (300 mg total) by mouth daily. 05/01/18   [name], NP  cloNIDine (CATAPRES) 0.1 MG tablet Take 1 tablet (0.1 mg total) by mouth 2 (two) times daily. 05/01/18   [name], NP  Elastic Bandages & Supports (TRUFORM SUPPORT SOCK 8-15MMHG) [place] 2 Devices by Does not apply route daily. 05/01/18   [name], NP  escitalopram (LEXAPRO) 10 MG tablet Take 1 tablet (10 mg total) by mouth daily. 05/01/18   [name], NP  hydrochlorothiazide (HYDRODIURIL) 12.5 MG tablet Take 1 tablet (12.5 mg total) by mouth daily. 05/01/18   [name], NP  ipratropium (ATROVENT) 0.02 % nebulizer solution Take 2.5 mLs (0.5 mg total) by nebulization every 6 (six) hours as needed for wheezing or shortness of breath. 06/14/16   [provider]  ipratropium-albuterol (DUONEB) 0.5-2.5 (3) MG/3ML SOLN Take 3 mLs by nebulization every 6 (six) hours as needed (wheezing, shortness of breath). 04/20/17   [name], NP  losartan (COZAAR) 100 MG tablet Take 1 tablet (100 mg total) by mouth daily. 05/01/18   [name], NP  metFORMIN (GLUCOPHAGE) 500 MG tablet Take 1 tablet (500 mg total) by mouth daily with breakfast for 14 days, THEN 1 tablet (500 mg total) 2 (two) times daily with a meal. 05/16/18 08/14/18  [name], NP  omeprazole (PRILOSEC) 40 MG capsule Take 1 capsule (40 mg total) by mouth 3 times/day as needed-between meals & bedtime. 05/01/18   [name], NP  polyethylene glycol powder (GAVILAX) powder TAKE 17 GMS ONCE DAILY WITH 8 OUNCES OF LIQUID 05/01/18   [name], NP  tiotropium (SPIRIVA HANDIHALER) 18 MCG inhalation capsule Place 1 capsule (18 mcg total) into inhaler and inhale daily. 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  He  sometimes has to rest prior to reaching his destination.  This is often predicated on his degree of dyspnea and/or chest discomfort and most times, right hip pain related to arthritis.  Over the past 6 months, he has noted increasing lower extremity swelling which he attributes to sitting around Little frequently.  He denies palpitations, PND, orthopnea, dizziness, syncope, or early satiety.  He continues to smoke 1 cigarette a day.  All other systems reviewed and are otherwise negative except as noted above.  Physical Exam    VS:  BP 130/80 (BP Location: Left Arm, Patient Position: Sitting, Cuff Size: Large)   Pulse 84   Ht 5\' 9"  (1.753 m)   Wt 269 lb 4 oz (122.1 kg)   BMI 39.76 kg/m  , BMI Body mass index is 39.76 kg/m. GEN: Morbidly obese, in no acute distress. HEENT: normal. Neck: Supple, obese, difficult to gauge JVP.  He has an aortic systolic murmur radiating to bilateral carotids.  No masses. Cardiac: RRR, 3/6 harsh and somewhat high-pitched systolic ejection murmur loudest at the upper sternal borders and radiating to bilateral carotids but heard across the precordium.  No rubs, or gallops. No clubbing, cyanosis.  2-3+ bilateral lower extremity edema to the calves.  Radials 2+ and equal bilaterally.  Respiratory:  Respirations regular and unlabored, clear to auscultation bilaterally. GI: Obese, soft, nontender, nondistended, BS + x 4. MS: no deformity or atrophy. Skin: warm and dry, no rash. Neuro:  Strength and sensation are intact. Psych: Normal affect.  Accessory Clinical Findings    ECG personally reviewed by me today -regular sinus rhythm, 84, left axis deviation, delayed R wave progression- no acute changes.  Assessment & Plan    1.  Preoperative cardiovascular evaluation: Patient is pending pancreatectomy next week at [facility].  He presents for preoperative evaluation.  He has chronic stable dyspnea on exertion as well as occasional exertional chest discomfort which is typically  fairly fleeting, resolving within under a minute with rest.  He does not have to use nitrates.  The symptoms date back to at least last fall, when he underwent stress testing which was nonischemic.  In that setting, I do not think that he requires additional ischemic testing.  He does note an increase in lower extremity swelling over the past 6 months and in the setting of moderate aortic stenosis, I will repeat an echocardiogram to ensure stability prior to his surgery next week.  2.  Moderate aortic stenosis: Follow-up echo as above in the setting of worsening lower extremity swelling.  3.  HFpEF: Patient's weight is stable but he has 2-3+ bilateral lower extremity edema.  He says this is mostly dependent in nature and not always present if he makes an effort to keep his legs up.  He is on HCTZ and we discussed either increasing this or switching it to Lasix however, pending surgery, he would prefer to hold off on any medication changes at this time and says he will do a better job with keeping his legs elevated.  He is also going to look into compression hose.  We discussed the importance of daily weights, sodium restriction, medication compliance, and symptom reporting and he verbalizes understanding.  Continue current medications.  4.  Essential hypertension: Relatively stable.  Continue current medications.  5.  Tobacco abuse: He continues to smoke 1 cigarette a day down from 3 packs a day.  Complete cessation advised.  6.  IPMN: Pending pancreatectomy as above.  Followed at [facility].  7.  Obstructive sleep apnea: Using CPAP.  8.  Type 2 diabetes mellitus: On metformin.  A1c was 7.3 in July.  This is followed by primary care.  9.  Disposition: Follow-up echocardiogram.  As long as this is stable, he would be able to proceed with surgery at [facility] next week and would be low to moderate risk for cardiac complications.  Follow-up in clinic in 3 months or sooner if necessary.   Murray Hodgkins,  NP 07/13/2018, 12:57 PM

## 2018-07-17 ENCOUNTER — Other Ambulatory Visit: Payer: Self-pay

## 2018-07-17 ENCOUNTER — Ambulatory Visit (INDEPENDENT_AMBULATORY_CARE_PROVIDER_SITE_OTHER): Payer: Medicare Other

## 2018-07-17 DIAGNOSIS — Z01810 Encounter for preprocedural cardiovascular examination: Secondary | ICD-10-CM

## 2018-07-17 DIAGNOSIS — I350 Nonrheumatic aortic (valve) stenosis: Secondary | ICD-10-CM

## 2018-07-19 DIAGNOSIS — H2512 Age-related nuclear cataract, left eye: Secondary | ICD-10-CM | POA: Diagnosis not present

## 2018-07-20 ENCOUNTER — Encounter: Payer: Self-pay | Admitting: Nurse Practitioner

## 2018-07-20 ENCOUNTER — Ambulatory Visit: Payer: Medicare Other | Admitting: Gastroenterology

## 2018-07-20 DIAGNOSIS — D490 Neoplasm of unspecified behavior of digestive system: Secondary | ICD-10-CM | POA: Diagnosis not present

## 2018-07-20 DIAGNOSIS — E785 Hyperlipidemia, unspecified: Secondary | ICD-10-CM | POA: Diagnosis not present

## 2018-07-20 DIAGNOSIS — H547 Unspecified visual loss: Secondary | ICD-10-CM | POA: Diagnosis not present

## 2018-07-20 DIAGNOSIS — R918 Other nonspecific abnormal finding of lung field: Secondary | ICD-10-CM | POA: Diagnosis not present

## 2018-07-20 DIAGNOSIS — K567 Ileus, unspecified: Secondary | ICD-10-CM | POA: Diagnosis not present

## 2018-07-20 DIAGNOSIS — J811 Chronic pulmonary edema: Secondary | ICD-10-CM | POA: Diagnosis not present

## 2018-07-20 DIAGNOSIS — G8918 Other acute postprocedural pain: Secondary | ICD-10-CM | POA: Diagnosis not present

## 2018-07-20 DIAGNOSIS — K551 Chronic vascular disorders of intestine: Secondary | ICD-10-CM | POA: Diagnosis not present

## 2018-07-20 DIAGNOSIS — J90 Pleural effusion, not elsewhere classified: Secondary | ICD-10-CM | POA: Diagnosis not present

## 2018-07-20 DIAGNOSIS — D136 Benign neoplasm of pancreas: Secondary | ICD-10-CM | POA: Diagnosis not present

## 2018-07-20 DIAGNOSIS — D62 Acute posthemorrhagic anemia: Secondary | ICD-10-CM | POA: Diagnosis not present

## 2018-07-20 DIAGNOSIS — Z79899 Other long term (current) drug therapy: Secondary | ICD-10-CM | POA: Diagnosis not present

## 2018-07-20 DIAGNOSIS — K91840 Postprocedural hemorrhage and hematoma of a digestive system organ or structure following a digestive system procedure: Secondary | ICD-10-CM | POA: Diagnosis not present

## 2018-07-20 DIAGNOSIS — R222 Localized swelling, mass and lump, trunk: Secondary | ICD-10-CM | POA: Diagnosis not present

## 2018-07-20 DIAGNOSIS — J029 Acute pharyngitis, unspecified: Secondary | ICD-10-CM | POA: Diagnosis not present

## 2018-07-20 DIAGNOSIS — R6889 Other general symptoms and signs: Secondary | ICD-10-CM | POA: Diagnosis not present

## 2018-07-20 DIAGNOSIS — E559 Vitamin D deficiency, unspecified: Secondary | ICD-10-CM | POA: Diagnosis not present

## 2018-07-20 DIAGNOSIS — Z7189 Other specified counseling: Secondary | ICD-10-CM | POA: Diagnosis not present

## 2018-07-20 DIAGNOSIS — D72829 Elevated white blood cell count, unspecified: Secondary | ICD-10-CM | POA: Diagnosis not present

## 2018-07-20 DIAGNOSIS — E871 Hypo-osmolality and hyponatremia: Secondary | ICD-10-CM | POA: Diagnosis not present

## 2018-07-20 DIAGNOSIS — K598 Other specified functional intestinal disorders: Secondary | ICD-10-CM | POA: Diagnosis not present

## 2018-07-20 DIAGNOSIS — I10 Essential (primary) hypertension: Secondary | ICD-10-CM | POA: Diagnosis not present

## 2018-07-20 DIAGNOSIS — K219 Gastro-esophageal reflux disease without esophagitis: Secondary | ICD-10-CM | POA: Diagnosis not present

## 2018-07-20 DIAGNOSIS — R140 Abdominal distension (gaseous): Secondary | ICD-10-CM | POA: Diagnosis not present

## 2018-07-20 DIAGNOSIS — G4733 Obstructive sleep apnea (adult) (pediatric): Secondary | ICD-10-CM | POA: Diagnosis not present

## 2018-07-20 DIAGNOSIS — J9600 Acute respiratory failure, unspecified whether with hypoxia or hypercapnia: Secondary | ICD-10-CM | POA: Diagnosis not present

## 2018-07-20 DIAGNOSIS — Z885 Allergy status to narcotic agent status: Secondary | ICD-10-CM | POA: Diagnosis not present

## 2018-07-20 DIAGNOSIS — Z9189 Other specified personal risk factors, not elsewhere classified: Secondary | ICD-10-CM | POA: Diagnosis not present

## 2018-07-20 DIAGNOSIS — J9811 Atelectasis: Secondary | ICD-10-CM | POA: Diagnosis not present

## 2018-07-20 DIAGNOSIS — Z452 Encounter for adjustment and management of vascular access device: Secondary | ICD-10-CM | POA: Diagnosis not present

## 2018-07-20 DIAGNOSIS — R58 Hemorrhage, not elsewhere classified: Secondary | ICD-10-CM | POA: Diagnosis not present

## 2018-07-20 DIAGNOSIS — E1151 Type 2 diabetes mellitus with diabetic peripheral angiopathy without gangrene: Secondary | ICD-10-CM | POA: Diagnosis not present

## 2018-07-20 DIAGNOSIS — J95821 Acute postprocedural respiratory failure: Secondary | ICD-10-CM | POA: Diagnosis not present

## 2018-07-20 DIAGNOSIS — K861 Other chronic pancreatitis: Secondary | ICD-10-CM | POA: Diagnosis not present

## 2018-07-20 DIAGNOSIS — J449 Chronic obstructive pulmonary disease, unspecified: Secondary | ICD-10-CM | POA: Diagnosis not present

## 2018-07-20 DIAGNOSIS — I350 Nonrheumatic aortic (valve) stenosis: Secondary | ICD-10-CM | POA: Diagnosis not present

## 2018-07-20 DIAGNOSIS — I7779 Dissection of other artery: Secondary | ICD-10-CM | POA: Diagnosis not present

## 2018-07-20 DIAGNOSIS — Z23 Encounter for immunization: Secondary | ICD-10-CM | POA: Diagnosis not present

## 2018-07-20 DIAGNOSIS — K920 Hematemesis: Secondary | ICD-10-CM | POA: Diagnosis not present

## 2018-07-20 DIAGNOSIS — I7389 Other specified peripheral vascular diseases: Secondary | ICD-10-CM | POA: Diagnosis not present

## 2018-07-20 DIAGNOSIS — M1990 Unspecified osteoarthritis, unspecified site: Secondary | ICD-10-CM | POA: Diagnosis not present

## 2018-07-20 DIAGNOSIS — K922 Gastrointestinal hemorrhage, unspecified: Secondary | ICD-10-CM | POA: Diagnosis not present

## 2018-07-20 DIAGNOSIS — I9751 Accidental puncture and laceration of a circulatory system organ or structure during a circulatory system procedure: Secondary | ICD-10-CM | POA: Diagnosis not present

## 2018-07-20 DIAGNOSIS — K9189 Other postprocedural complications and disorders of digestive system: Secondary | ICD-10-CM | POA: Diagnosis not present

## 2018-07-20 DIAGNOSIS — Z720 Tobacco use: Secondary | ICD-10-CM | POA: Diagnosis not present

## 2018-07-23 HISTORY — PX: PANCREATICODUODENECTOMY: SUR1000

## 2018-08-01 ENCOUNTER — Ambulatory Visit: Payer: Medicare Other | Admitting: Nurse Practitioner

## 2018-08-02 ENCOUNTER — Encounter: Payer: Self-pay | Admitting: *Deleted

## 2018-08-02 NOTE — Pre-Procedure Instructions (Signed)
SPOKE WITH CINDY RN AT [place]. PATIENT STILL INPATIENT AT [facility] AFTER WHIPPLE AND ADDITIONAL SURGERY. THEY WILL CANCEL CATARACT SURGERY FOR 08/08/28

## 2018-08-08 ENCOUNTER — Encounter: Admission: RE | Payer: Self-pay | Source: Ambulatory Visit

## 2018-08-08 ENCOUNTER — Ambulatory Visit: Admission: RE | Admit: 2018-08-08 | Payer: Medicare Other | Source: Ambulatory Visit | Admitting: Ophthalmology

## 2018-08-08 ENCOUNTER — Other Ambulatory Visit: Payer: Self-pay

## 2018-08-08 HISTORY — DX: Dyspnea, unspecified: R06.00

## 2018-08-08 SURGERY — PHACOEMULSIFICATION, CATARACT, WITH IOL INSERTION
Anesthesia: Choice | Laterality: Left

## 2018-08-08 NOTE — Patient Outreach (Signed)
[facility]) Care Management  08/08/2018  Wilburn Keir 1946-09-18 [id number]   Medication Adherence call to Mr. Rolin Schult left a message for patient to call back patient is due on Losartan 100 mg. Mr. Wajda is showing past due under [employer].   [facility] Management Direct Dial [phone]  Fax [phone] [email or web address]

## 2018-08-09 DIAGNOSIS — Z23 Encounter for immunization: Secondary | ICD-10-CM | POA: Diagnosis not present

## 2018-08-10 DIAGNOSIS — D490 Neoplasm of unspecified behavior of digestive system: Secondary | ICD-10-CM | POA: Diagnosis not present

## 2018-08-11 DIAGNOSIS — D136 Benign neoplasm of pancreas: Secondary | ICD-10-CM | POA: Diagnosis not present

## 2018-08-11 DIAGNOSIS — J449 Chronic obstructive pulmonary disease, unspecified: Secondary | ICD-10-CM | POA: Diagnosis not present

## 2018-08-11 DIAGNOSIS — Z4801 Encounter for change or removal of surgical wound dressing: Secondary | ICD-10-CM | POA: Diagnosis not present

## 2018-08-11 DIAGNOSIS — I119 Hypertensive heart disease without heart failure: Secondary | ICD-10-CM | POA: Diagnosis not present

## 2018-08-11 DIAGNOSIS — Z483 Aftercare following surgery for neoplasm: Secondary | ICD-10-CM | POA: Diagnosis not present

## 2018-08-14 DIAGNOSIS — Z483 Aftercare following surgery for neoplasm: Secondary | ICD-10-CM | POA: Diagnosis not present

## 2018-08-14 DIAGNOSIS — Z4801 Encounter for change or removal of surgical wound dressing: Secondary | ICD-10-CM | POA: Diagnosis not present

## 2018-08-14 DIAGNOSIS — D136 Benign neoplasm of pancreas: Secondary | ICD-10-CM | POA: Diagnosis not present

## 2018-08-14 DIAGNOSIS — I119 Hypertensive heart disease without heart failure: Secondary | ICD-10-CM | POA: Diagnosis not present

## 2018-08-14 DIAGNOSIS — J449 Chronic obstructive pulmonary disease, unspecified: Secondary | ICD-10-CM | POA: Diagnosis not present

## 2018-08-15 ENCOUNTER — Telehealth: Payer: Self-pay

## 2018-08-15 DIAGNOSIS — G4733 Obstructive sleep apnea (adult) (pediatric): Secondary | ICD-10-CM | POA: Diagnosis not present

## 2018-08-15 DIAGNOSIS — D136 Benign neoplasm of pancreas: Secondary | ICD-10-CM | POA: Diagnosis not present

## 2018-08-15 DIAGNOSIS — J449 Chronic obstructive pulmonary disease, unspecified: Secondary | ICD-10-CM | POA: Diagnosis not present

## 2018-08-15 DIAGNOSIS — Z4801 Encounter for change or removal of surgical wound dressing: Secondary | ICD-10-CM | POA: Diagnosis not present

## 2018-08-15 DIAGNOSIS — Z483 Aftercare following surgery for neoplasm: Secondary | ICD-10-CM | POA: Diagnosis not present

## 2018-08-15 DIAGNOSIS — I119 Hypertensive heart disease without heart failure: Secondary | ICD-10-CM | POA: Diagnosis not present

## 2018-08-15 NOTE — Telephone Encounter (Signed)
Don with [facility] home health called wanting orders for OT 1 time this week and 1 time next week.  Patient is also requesting nursing asst to help with bathing.  They are requesting 4 visits.  Don call back is [phone]

## 2018-08-15 NOTE — Telephone Encounter (Signed)
I spoke with Timmothy Sours and notified him of Lauren recommendations.

## 2018-08-15 NOTE — Telephone Encounter (Signed)
Patient has not yet been seen in office after hospital discharge.  Nathaniel Little will be accepting risk that patient may not be seen if he does not attend this appointment.    Patient may have approval for OT 1 time this week and next as well as nursing assistant for bathing request for 4 visits during the next 2 weeks.

## 2018-08-16 DIAGNOSIS — J449 Chronic obstructive pulmonary disease, unspecified: Secondary | ICD-10-CM | POA: Diagnosis not present

## 2018-08-16 DIAGNOSIS — D136 Benign neoplasm of pancreas: Secondary | ICD-10-CM | POA: Diagnosis not present

## 2018-08-16 DIAGNOSIS — I119 Hypertensive heart disease without heart failure: Secondary | ICD-10-CM | POA: Diagnosis not present

## 2018-08-16 DIAGNOSIS — Z4801 Encounter for change or removal of surgical wound dressing: Secondary | ICD-10-CM | POA: Diagnosis not present

## 2018-08-16 DIAGNOSIS — Z483 Aftercare following surgery for neoplasm: Secondary | ICD-10-CM | POA: Diagnosis not present

## 2018-08-17 DIAGNOSIS — Z483 Aftercare following surgery for neoplasm: Secondary | ICD-10-CM | POA: Diagnosis not present

## 2018-08-17 DIAGNOSIS — I119 Hypertensive heart disease without heart failure: Secondary | ICD-10-CM | POA: Diagnosis not present

## 2018-08-17 DIAGNOSIS — J449 Chronic obstructive pulmonary disease, unspecified: Secondary | ICD-10-CM | POA: Diagnosis not present

## 2018-08-17 DIAGNOSIS — Z4801 Encounter for change or removal of surgical wound dressing: Secondary | ICD-10-CM | POA: Diagnosis not present

## 2018-08-17 DIAGNOSIS — D136 Benign neoplasm of pancreas: Secondary | ICD-10-CM | POA: Diagnosis not present

## 2018-08-18 ENCOUNTER — Ambulatory Visit (INDEPENDENT_AMBULATORY_CARE_PROVIDER_SITE_OTHER): Payer: Medicare Other | Admitting: Nurse Practitioner

## 2018-08-18 ENCOUNTER — Other Ambulatory Visit: Payer: Self-pay

## 2018-08-18 ENCOUNTER — Encounter: Payer: Self-pay | Admitting: Nurse Practitioner

## 2018-08-18 VITALS — BP 127/84 | HR 100 | Temp 98.3°F | Ht 69.0 in | Wt 256.8 lb

## 2018-08-18 DIAGNOSIS — D136 Benign neoplasm of pancreas: Secondary | ICD-10-CM | POA: Diagnosis not present

## 2018-08-18 DIAGNOSIS — I119 Hypertensive heart disease without heart failure: Secondary | ICD-10-CM | POA: Diagnosis not present

## 2018-08-18 DIAGNOSIS — Z4801 Encounter for change or removal of surgical wound dressing: Secondary | ICD-10-CM | POA: Diagnosis not present

## 2018-08-18 DIAGNOSIS — E1142 Type 2 diabetes mellitus with diabetic polyneuropathy: Secondary | ICD-10-CM | POA: Diagnosis not present

## 2018-08-18 DIAGNOSIS — Z90411 Acquired partial absence of pancreas: Secondary | ICD-10-CM

## 2018-08-18 DIAGNOSIS — K5903 Drug induced constipation: Secondary | ICD-10-CM | POA: Diagnosis not present

## 2018-08-18 DIAGNOSIS — F4310 Post-traumatic stress disorder, unspecified: Secondary | ICD-10-CM

## 2018-08-18 DIAGNOSIS — J449 Chronic obstructive pulmonary disease, unspecified: Secondary | ICD-10-CM | POA: Diagnosis not present

## 2018-08-18 DIAGNOSIS — F5104 Psychophysiologic insomnia: Secondary | ICD-10-CM

## 2018-08-18 DIAGNOSIS — Z483 Aftercare following surgery for neoplasm: Secondary | ICD-10-CM | POA: Diagnosis not present

## 2018-08-18 MED ORDER — BLOOD GLUCOSE METER KIT: PACK | 0 refills | Status: DC

## 2018-08-18 MED ORDER — TRAZODONE HCL 50 MG PO TABS: 25.0000 mg | ORAL_TABLET | Freq: Every evening | ORAL | 1 refills | Status: DC | PRN

## 2018-08-18 MED ORDER — DOCUSATE SODIUM 250 MG PO CAPS: 250.0000 mg | ORAL_CAPSULE | Freq: Every day | ORAL | 0 refills | Status: DC | PRN

## 2018-08-18 NOTE — Progress Notes (Signed)
Subjective:    Patient ID: Nathaniel Little, male    DOB: 1945/12/14, 72 y.o.   MRN: [id number]  Nathaniel Little is a 72 y.o. male presenting on 08/18/2018 for Hospitalization Follow-up ([place], SURGICAL; PANCREATECTOMY, )   HPI  HOSPITAL FOLLOW-UP VISIT  Hospital/Location: [facility] Date of Admission: 07/20/2018 Date of Discharge: 08/11/2018 Transitions of care telephone call: not completed.  Reason for Admission: Whipple with pancreatojejunostomy Primary (+Secondary) Diagnosis: IPMN - Intraductal papillary mucinous neoplasm  FOLLOW-UP  - Hospital H&P and Discharge Summary have been reviewed - Patient presents today 7 days after recent hospitalization. Brief summary of recent course, patient had surgical procedure and required extended hospitalization for recovery with minor complication requiring repeat surgical procedure. This complication was traumatic for patient. He has done fairly since being home, but notes significant nausea preventing adequate oral intake and insomnia (see below). - Pt weight 255 lbs on discharge. - Is eating vegetable beef, fruit, toast (butter, cheese, ham) with success. Stovetop stuffing did not settle well.  Medication Reconciliation Patient has not been taking any metformin or BP meds since discharge. - New medications on discharge:  1. amoxicillin-clavulanate (AUGMENTIN) 875-125 mg tablet Take 1 tablet (875 mg total) by mouth every 12 (twelve) hours Qty: 12 tablet, Refills: 0  2. clopidogrel (PLAVIX) 75 mg tablet Take 1 tablet (75 mg total) by mouth once daily Qty: 30 tablet, Refills: 3  3. ondansetron (ZOFRAN-ODT) 4 MG disintegrating tablet Take 1 tablet (4 mg total) by mouth every 8 (eight) hours as needed for Nausea Qty: 20 tablet, Refills: 0  4. pantoprazole (PROTONIX) 40 MG DR tablet Take 1 tablet (40 mg total) by mouth once daily Qty: 30 tablet, Refills: 11  5. Bisacodyl 10 mg daily prn.  - Changes to current meds on discharge:  STOP  aspirin 81 MG EC tablet, escitalopram oxalate (LEXAPRO) 10 MG tablet , losartan (COZAAR) 100 MG tablet, lubiprostone (AMITIZA) 8 MCG capsule, omeprazole (PRILOSEC) 40 MG DR capsule  Insomnia - Has trouble sleeping - sleeps 1 hr and awake 5 hours.  Has flashbacks of surgery and coming out of surgery after rushed into ICU with emergency, also had some times he "felt like he was suffocating from air."  Normally only slept about 4 hours per night.  HCPOA update - Patient has deemed himself a ward of the state if he were to be unable to make decisions for himself.  He DOES NOT want his daughter making medical decisions.  Documents were copied and are to be scanned.  Social History   Tobacco Use  . Smoking status: Current Every Day Smoker    Packs/day: 0.10    Years: 68.00    Pack years: 6.80    Types: Cigarettes    Last attempt to quit: 04/01/2017    Years since quitting: 1.3  . Smokeless tobacco: Former Systems developer    Types: Chew, Bells date: 05/16/2016  . Tobacco comment: 1/2 cig. day  Substance Use Topics  . Alcohol use: Yes    Alcohol/week: 1.0 standard drinks    Types: 1 Cans of beer per week    Comment: occ  . Drug use: No    Review of Systems Per HPI unless specifically indicated above  Outpatient Encounter Medications as of 08/18/2018  Medication Sig  . albuterol (PROVENTIL HFA;VENTOLIN HFA) 108 (90 Base) MCG/ACT inhaler Inhale 2 puffs into the lungs every 6 (six) hours as needed for wheezing or shortness of breath.  [name] amoxicillin-clavulanate (AUGMENTIN)  875-125 MG tablet Take 1 tablet by mouth 2 (two) times daily.  . budesonide-formoterol (SYMBICORT) 160-4.5 MCG/ACT inhaler Inhale 2 puffs into the lungs 2 (two) times daily.  . clopidogrel (PLAVIX) 75 MG tablet Take by mouth.  [name] ipratropium-albuterol (DUONEB) 0.5-2.5 (3) MG/3ML SOLN Take 3 mLs by nebulization every 6 (six) hours as needed (wheezing, shortness of breath).  . ondansetron (ZOFRAN) 4 MG tablet Take 4 mg by mouth  every 8 (eight) hours as needed for nausea or vomiting.  . pantoprazole (PROTONIX) 40 MG tablet Take by mouth.  . tiotropium (SPIRIVA HANDIHALER) 18 MCG inhalation capsule Place 1 capsule (18 mcg total) into inhaler and inhale daily.  [name] aspirin 81 MG tablet Take 81 mg by mouth daily.  [name] buPROPion (WELLBUTRIN XL) 300 MG 24 hr tablet Take 1 tablet (300 mg total) by mouth daily. (Patient not taking: Reported on 08/18/2018)  . cloNIDine (CATAPRES) 0.1 MG tablet Take 1 tablet (0.1 mg total) by mouth 2 (two) times daily. (Patient not taking: Reported on 08/18/2018)  . Elastic Bandages & Supports (TRUFORM SUPPORT SOCK 8-15MMHG) MISC 2 Devices by Does not apply route daily. (Patient not taking: Reported on 08/18/2018)  . escitalopram (LEXAPRO) 10 MG tablet Take 1 tablet (10 mg total) by mouth daily. (Patient not taking: Reported on 08/18/2018)  . hydrochlorothiazide (HYDRODIURIL) 12.5 MG tablet Take 1 tablet (12.5 mg total) by mouth daily. (Patient not taking: Reported on 08/18/2018)  . ipratropium (ATROVENT) 0.02 % nebulizer solution Take 2.5 mLs (0.5 mg total) by nebulization every 6 (six) hours as needed for wheezing or shortness of breath.  . losartan (COZAAR) 100 MG tablet Take 1 tablet (100 mg total) by mouth daily. (Patient not taking: Reported on 08/18/2018)  . omeprazole (PRILOSEC) 40 MG capsule Take 1 capsule (40 mg total) by mouth 3 times/day as needed-between meals & bedtime. (Patient not taking: Reported on 08/18/2018)   No facility-administered encounter medications on file as of 08/18/2018.       Objective:    BP 127/84 (BP Location: Left Arm, Patient Position: Sitting, Cuff Size: Large)   Pulse 100   Temp 98.3 F (36.8 C) (Oral)   Ht _0  (1.753 m)   Wt 256 lb 12.8 oz (116.5 kg)   BMI 37.92 kg/m   Wt Readings from Last 3 Encounters:  08/18/18 256 lb 12.8 oz (116.5 kg)  07/13/18 269 lb 4 oz (122.1 kg)  06/16/18 266 lb 9.6 oz (120.9 kg)    Physical Exam  Constitutional: He is  oriented to person, place, and time. He appears well-developed and well-nourished. No distress.  HENT:  Head: Normocephalic and atraumatic.  Cardiovascular: Normal rate, regular rhythm, S1 normal, S2 normal, normal heart sounds and intact distal pulses.  Pulmonary/Chest: Effort normal and breath sounds normal. No respiratory distress.  Abdominal: Soft. Bowel sounds are normal. He exhibits no distension. There is no hepatosplenomegaly. There is tenderness (generalized). No hernia.    Neurological: He is alert and oriented to person, place, and time.  Skin: Skin is warm and dry.  Psychiatric: He has a normal mood and affect. His behavior is normal.  Vitals reviewed.   Results for orders placed or performed in visit on 06/19/18  HM DIABETES EYE EXAM  Result Value Ref Range   HM Diabetic Eye Exam Retinopathy (A) No Retinopathy      Assessment & Plan:   Problem List Items Addressed This Visit    None    Visit Diagnoses    DM type  2 with diabetic peripheral neuropathy [name])    -  Primary Patient has been off mediacations.  Instructed patient to collect CBG readings fasting am for 2 weeks.  Then will send to clinic.  Will most likely need to resume metformin, may need to add insulin after partial pancreatectomy.  - Referral placed to Endocrinology given new status after partial pancreatectomy. - Follow-up 3 months.   Relevant Medications   blood glucose meter kit and supplies   traZODone (DESYREL) 50 MG tablet   Other Relevant Orders   Ambulatory referral to Endocrinology   History of partial pancreatectomy     Patient with IPMN and is s/p surgical removal.  Continues follow-up with [facility] oncology.  Will need new endocrinologist.  Continue HH care for rehab.  Follow-up prn.   Relevant Orders   Ambulatory referral to Endocrinology   Drug-induced constipation     Patient with opioid induced consitpation.  Is already taking bisacodyl.  May start taking colace 250 mg daily prn no BM x 2  days.   Relevant Medications   docusate sodium (COLACE) 250 MG capsule   PTSD (post-traumatic stress disorder)     Insomnia likely due to PTSD from traumatic event during hospital as patient is experiencing flashbacks awakening him from sleep.   - Start trazodone 25 mg daily. - May consider psychiatry vs counseling in future. - Follow-up prn and 3 months.   Relevant Medications   traZODone (DESYREL) 50 MG tablet   Psychophysiological insomnia     See PTSD above.   Relevant Medications   traZODone (DESYREL) 50 MG tablet      Meds ordered this encounter  Medications  . blood glucose meter kit and supplies    Sig: Dispense based on patient and insurance preference. Use up to four times daily as directed. (FOR ICD-10 E10.9, E11.9).    Dispense:  1 each    Refill:  0    Order Specific Question:   Supervising Provider    Answer:   Olin Hauser [[id number]]    Order Specific Question:   Number of strips    Answer:   100    Order Specific Question:   Number of lancets    Answer:   100  . traZODone (DESYREL) 50 MG tablet    Sig: Take 0.5 tablets (25 mg total) by mouth at bedtime as needed for sleep.    Dispense:  30 tablet    Refill:  1    Order Specific Question:   Supervising Provider    Answer:   Olin Hauser [[id number]]  . docusate sodium (COLACE) 250 MG capsule    Sig: Take 1 capsule (250 mg total) by mouth daily as needed for constipation.    Dispense:  10 capsule    Refill:  0    Order Specific Question:   Supervising Provider    Answer:   Olin Hauser [[id number]]   I have reviewed the admission H&P, hospital notes, discharge summary, discharge medication list, and have reconciled the current and discharge medications today.  Follow up plan: Return in about 3 months (around 11/18/2018) for diabetes, blood pressure.   Cassell Smiles, DNP, AGPCNP-BC Adult Gerontology Primary Care Nurse Practitioner [facility] 08/18/2018, 3:21 PM

## 2018-08-18 NOTE — Patient Instructions (Addendum)
Nathaniel Little,   Thank you for coming in to clinic today.  1. For your flashbacks - START trazodone 25 mg once daily (1/2 tablet) about 30 minutes before bed.  2. START checking blood sugars daily in morning and write it down. - continue off metformin until I get 2 weeks of readings.  3. Continue off all blood pressure medicines.  4. [facility] Endocrinology - aka Diabetes specialist - will call you to schedule an appointment.  5. For stool softener - take docusate sodium 250mg  daily as needed for no BM in 2 days.  Please schedule a follow-up appointment with Cassell Smiles, AGNP. Return in about 3 months (around 11/18/2018) for diabetes, blood pressure.  If you have any other questions or concerns, please feel free to call the clinic or send a message through [place]. You may also schedule an earlier appointment if necessary.  You will receive a survey after today's visit either digitally by e-mail or paper by [name]. Your experiences and feedback matter to [name].  Please respond so we know how we are doing as we provide care for you.   Cassell Smiles, DNP, AGNP-BC Adult Gerontology Nurse Practitioner [facility], [facility]    Living With Post-Traumatic Stress Disorder If you have been diagnosed with post-traumatic stress disorder (PTSD), you may be relieved that you now know why you have felt or behaved a certain way. Still, you may feel overwhelmed about the treatment ahead. You may also wonder how to get the support you need and how to deal with the condition day-to-day. If you are living with PTSD, there are ways to help you recover from it and manage your symptoms. How to manage lifestyle changes Managing stress Stress is your body's reaction to life changes and events, both good and bad. Stress can make PTSD worse. Take the following steps to cope with stress:  Talk with your health care provider or a counselor if you would like to learn more about techniques  to reduce your stress. He or she may suggest some stress reduction techniques such as: ? Muscle relaxation exercises. ? Regular exercise. ? Meditation, yoga, or other mind-body exercises. ? Breathing exercises. ? Listening to quiet music. ? Spending time outside.  Maintain a healthy lifestyle. Eat a healthy diet, exercise regularly, get plenty of sleep, and take time to relax.  Spend time with others. Talk with them about how you are feeling and what kind of support you need. Try to not isolate yourself, even though you may feel like doing that. Isolating yourself can delay your recovery.  Do activities and hobbies that you enjoy.  Pace yourself when doing stressful things. Take breaks, and reward yourself when you finish. Make sure that you do not overload your schedule.  Medicines Your health care provider may suggest certain medicines if he or she feels that they will help to improve your condition. Antidepressants or antipsychotic medicines may be used to treat PTSD. Avoid using alcohol and other substances that may prevent your medicines from working properly (may interact). It is also important to:  Talk with your pharmacist or health care provider about all medicines that you take, their possible side effects, and which medicines are safe to take together.  Make it your goal to take part in all treatment decisions (shared decision-making). Ask about possible side effects of medicines that your health care provider recommends, and tell him or her how you feel about having those side effects. It is best if  shared decision-making with your health care provider is part of your total treatment plan.  If your health care provider prescribes a medicine, you may not notice the full benefits of it for 4-8 weeks. Most people who are treated for PTSD need to take medicine for at least 6-12 months after they feel better. If you are taking medicines as part of your treatment, do not stop taking  medicines before you ask your health care provider if it is safe to stop. You may need to have the medicine slowly decreased (tapered) over time to lower the risk of harmful side effects. Relationships Many people who have PTSD have difficulty trusting others. Make an effort to:  Take risks and develop trust with close friends and family members. Developing trust in others can help you feel safe and connect you with emotional support.  Be open and honest about your feelings.  Try to have fun and relax in safe spaces, such as with friends and family.  Think about going to couples counseling, family education classes, or family therapy. Your loved ones may not always know how to be supportive. Therapy can be helpful for everyone.  How to recognize changes in your condition Be aware of your symptoms and how often you have them. The following symptoms mean that you need to seek help for your PTSD:  You feel suspicious and angry.  You have repeated flashbacks.  You avoid going out or being with others.  You have an increasing number of fights with close friends or family members, such as your spouse.  You have thoughts about hurting yourself or others.  You cannot get relief from feelings of depression or anxiety.  Where to find support Talking to others  Explain that PTSD is a mental health problem. It is something that a person can develop after experiencing or seeing a life-threatening event. Tell them that PTSD makes you feel stress like you did during the event.  Talk to your loved ones about the symptoms you have. Also tell them what things or situations can cause symptoms to start (are triggers for you).  Assure your loved ones that there are treatments to help PTSD. Discuss possibly seeking family therapy or couples therapy.  If you are worried or fearful about seeking treatment, ask for support. Finances Not all insurance plans cover mental health care, so it is important to  check with your insurance carrier. If paying for co-pays or counseling services is a problem, search for a local or county mental health care center. Public mental health care services may be offered there at a low cost or no cost when you are not able to see a private health care provider. If you are a veteran, contact a local veterans organization or veterans hospital for more information. If you are taking medicine for PTSD, you may be able to get the genericform, which may be less expensive than brand-name medicine. Some makers of prescription medicines also offer help to patients who cannot afford the medicines that they need. Community resources  Find a support group in your community. Often, groups are available for [employer] veterans, trauma victims, and family members or caregivers.  Look into volunteer opportunities. Taking part in these can help you feel more connected to your community.  Contact a local organization to find out if you are eligible for a service dog.  Keep daily contact with at least one trusted friend or family member. Follow these instructions at home: Lifestyle  Exercise regularly.  Try to do 30 or more minutes of physical activity on most days of the week.  Try to get 7-9 hours of sleep each night. To help with sleep: ? Keep your bedroom cool and dark. ? Do not eat a heavy meal during the hour before you go to bed. ? Do not drink alcohol or caffeinated drinks before bed. ? Avoid screen time before bedtime. This means avoiding use of your TV, computer, tablet, and cell phone.  Avoid using alcohol or drugs.  Practice self-soothing skills and use them daily.  Try to have fun and seek humor in your life. General instructions  If your PTSD is affecting your marriage or family, seek help from a family therapist.  Take over-the-counter and prescription medicines only as told by your health care provider.  Make sure to let all of your health care providers know  that you have PTSD. This is especially important if you are having surgery or need to be admitted to the hospital.  Keep all follow-up visits as told by your health care providers. This is important. Where to find more information: Go to this website to find more information about PTSD, treatment for PTSD, and how to get support:  [facility] for PTSD: www.ptsd.PaintballBuzz.cz  Contact a health care provider if:  Your symptoms get worse or they do not get better. Get help right away if:  You have thoughts about hurting yourself or others. If you ever feel like you may hurt yourself or others, or have thoughts about taking your own life, get help right away. You can go to your nearest emergency department or call:  Your local emergency services (911 in the [place]  A suicide crisis helpline, such as the [place] at (516)873-7523. This is open 24-hours a day.  Summary  If you are living with PTSD, there are ways to help you recover from it and manage your symptoms.  Find supportive environments and people who understand PTSD. Spend time in those places, and maintain contact with those people.  Work with your health care team to create a management plan that includes counseling, stress management techniques, and healthy lifestyle habits. This information is not intended to replace advice given to you by your health care provider. Make sure you discuss any questions you have with your health care provider. Document Released: 01/20/2017 Document Revised: 01/20/2017 Document Reviewed: 01/20/2017 Elsevier Interactive Patient Education  [employer].

## 2018-08-21 DIAGNOSIS — D136 Benign neoplasm of pancreas: Secondary | ICD-10-CM | POA: Diagnosis not present

## 2018-08-21 DIAGNOSIS — Z4801 Encounter for change or removal of surgical wound dressing: Secondary | ICD-10-CM | POA: Diagnosis not present

## 2018-08-21 DIAGNOSIS — I119 Hypertensive heart disease without heart failure: Secondary | ICD-10-CM | POA: Diagnosis not present

## 2018-08-21 DIAGNOSIS — J449 Chronic obstructive pulmonary disease, unspecified: Secondary | ICD-10-CM | POA: Diagnosis not present

## 2018-08-21 DIAGNOSIS — Z483 Aftercare following surgery for neoplasm: Secondary | ICD-10-CM | POA: Diagnosis not present

## 2018-08-24 DIAGNOSIS — D136 Benign neoplasm of pancreas: Secondary | ICD-10-CM | POA: Diagnosis not present

## 2018-08-24 DIAGNOSIS — I119 Hypertensive heart disease without heart failure: Secondary | ICD-10-CM | POA: Diagnosis not present

## 2018-08-24 DIAGNOSIS — Z483 Aftercare following surgery for neoplasm: Secondary | ICD-10-CM | POA: Diagnosis not present

## 2018-08-24 DIAGNOSIS — Z4801 Encounter for change or removal of surgical wound dressing: Secondary | ICD-10-CM | POA: Diagnosis not present

## 2018-08-24 DIAGNOSIS — J449 Chronic obstructive pulmonary disease, unspecified: Secondary | ICD-10-CM | POA: Diagnosis not present

## 2018-08-25 ENCOUNTER — Encounter: Payer: Self-pay | Admitting: Nurse Practitioner

## 2018-08-28 ENCOUNTER — Telehealth: Payer: Self-pay | Admitting: Nurse Practitioner

## 2018-08-28 DIAGNOSIS — Z483 Aftercare following surgery for neoplasm: Secondary | ICD-10-CM | POA: Diagnosis not present

## 2018-08-28 DIAGNOSIS — D136 Benign neoplasm of pancreas: Secondary | ICD-10-CM | POA: Diagnosis not present

## 2018-08-28 DIAGNOSIS — I119 Hypertensive heart disease without heart failure: Secondary | ICD-10-CM | POA: Diagnosis not present

## 2018-08-28 DIAGNOSIS — J449 Chronic obstructive pulmonary disease, unspecified: Secondary | ICD-10-CM | POA: Diagnosis not present

## 2018-08-28 DIAGNOSIS — Z4801 Encounter for change or removal of surgical wound dressing: Secondary | ICD-10-CM | POA: Diagnosis not present

## 2018-08-28 NOTE — Telephone Encounter (Signed)
Augmentin was due to be stopped on 08/15/2018.  He should followup with his surgeon by phone for continuation of Augmentin if needed.

## 2018-08-28 NOTE — Telephone Encounter (Signed)
Attempted to contact the pt, no answer. Mailbox full, unable to leave a message

## 2018-08-28 NOTE — Telephone Encounter (Signed)
Lori with [facility] said pt has amoxicillin in tablets but needs it in liquid.  She also said pt was being discharged today.  Please call pt [phone]

## 2018-08-29 DIAGNOSIS — D136 Benign neoplasm of pancreas: Secondary | ICD-10-CM | POA: Diagnosis not present

## 2018-08-29 DIAGNOSIS — Z483 Aftercare following surgery for neoplasm: Secondary | ICD-10-CM | POA: Diagnosis not present

## 2018-08-29 DIAGNOSIS — I119 Hypertensive heart disease without heart failure: Secondary | ICD-10-CM | POA: Diagnosis not present

## 2018-08-29 DIAGNOSIS — Z4801 Encounter for change or removal of surgical wound dressing: Secondary | ICD-10-CM | POA: Diagnosis not present

## 2018-08-29 DIAGNOSIS — J449 Chronic obstructive pulmonary disease, unspecified: Secondary | ICD-10-CM | POA: Diagnosis not present

## 2018-08-30 NOTE — Telephone Encounter (Signed)
I spoke with the patient yesterday and he verbalize understanding.

## 2018-09-07 ENCOUNTER — Encounter: Payer: Self-pay | Admitting: Emergency Medicine

## 2018-09-07 ENCOUNTER — Ambulatory Visit: Payer: Medicare Other | Admitting: Podiatry

## 2018-09-07 ENCOUNTER — Emergency Department: Payer: Medicare Other

## 2018-09-07 ENCOUNTER — Emergency Department
Admission: EM | Admit: 2018-09-07 | Discharge: 2018-09-08 | Disposition: A | Discharge status: Home / Self Care | Payer: Medicare Other | Attending: Emergency Medicine | Admitting: Emergency Medicine

## 2018-09-07 DIAGNOSIS — R0600 Dyspnea, unspecified: Secondary | ICD-10-CM | POA: Insufficient documentation

## 2018-09-07 DIAGNOSIS — I10 Essential (primary) hypertension: Secondary | ICD-10-CM | POA: Diagnosis not present

## 2018-09-07 DIAGNOSIS — J449 Chronic obstructive pulmonary disease, unspecified: Secondary | ICD-10-CM | POA: Insufficient documentation

## 2018-09-07 DIAGNOSIS — F17210 Nicotine dependence, cigarettes, uncomplicated: Secondary | ICD-10-CM | POA: Diagnosis not present

## 2018-09-07 DIAGNOSIS — J441 Chronic obstructive pulmonary disease with (acute) exacerbation: Secondary | ICD-10-CM | POA: Diagnosis not present

## 2018-09-07 DIAGNOSIS — Z79899 Other long term (current) drug therapy: Secondary | ICD-10-CM | POA: Diagnosis not present

## 2018-09-07 DIAGNOSIS — R0602 Shortness of breath: Secondary | ICD-10-CM | POA: Diagnosis not present

## 2018-09-07 DIAGNOSIS — Z7901 Long term (current) use of anticoagulants: Secondary | ICD-10-CM | POA: Diagnosis not present

## 2018-09-07 DIAGNOSIS — Z8546 Personal history of malignant neoplasm of prostate: Secondary | ICD-10-CM | POA: Diagnosis not present

## 2018-09-07 DIAGNOSIS — J9811 Atelectasis: Secondary | ICD-10-CM | POA: Diagnosis not present

## 2018-09-07 DIAGNOSIS — R05 Cough: Secondary | ICD-10-CM | POA: Diagnosis not present

## 2018-09-07 LAB — BLOOD GAS, VENOUS
Acid-Base Excess: 3.3 mmol/L — ABNORMAL HIGH (ref 0.0–2.0)
Bicarbonate: 27.8 mmol/L (ref 20.0–28.0)
O2 Saturation: 92.3 %
PATIENT TEMPERATURE: 37
PO2 VEN: 62 mmHg — AB (ref 32.0–45.0)
pCO2, Ven: 41 mmHg — ABNORMAL LOW (ref 44.0–60.0)
pH, Ven: 7.44 — ABNORMAL HIGH (ref 7.250–7.430)

## 2018-09-07 LAB — CBC
HCT: 33.6 % — ABNORMAL LOW (ref 39.0–52.0)
HEMOGLOBIN: 10.6 g/dL — AB (ref 13.0–17.0)
MCH: 27 pg (ref 26.0–34.0)
MCHC: 31.5 g/dL (ref 30.0–36.0)
MCV: 85.5 fL (ref 80.0–100.0)
PLATELETS: 335 10*3/uL (ref 150–400)
RBC: 3.93 MIL/uL — ABNORMAL LOW (ref 4.22–5.81)
RDW: 16.3 % — AB (ref 11.5–15.5)
WBC: 9.1 10*3/uL (ref 4.0–10.5)
nRBC: 0 % (ref 0.0–0.2)

## 2018-09-07 NOTE — ED Triage Notes (Signed)
Pt arrived via EMS from home where pt has had increased SOB since 2100 tonight without relief from home nebulizer treatments. Pt wears cpap at night. Pt with chronic COPD. Pts lung sounds are clear on arrival. O2 at 96% RA. Denies O2 usage at home.

## 2018-09-08 LAB — TROPONIN I: Troponin I: <0.03 ng/mL (ref <0.03)

## 2018-09-08 LAB — BASIC METABOLIC PANEL
ANION GAP: 6 (ref 5–15)
BUN: 18 mg/dL (ref 8–23)
CO2: 27 mmol/L (ref 22–32)
CREATININE: 0.87 mg/dL (ref 0.61–1.24)
Calcium: 8.9 mg/dL (ref 8.9–10.3)
Chloride: 102 mmol/L (ref 98–111)
GFR calc Af Amer: >60 mL/min (ref >60)
GFR calc non Af Amer: >60 mL/min (ref >60)
Glucose, Bld: 170 mg/dL — ABNORMAL HIGH (ref 70–99)
Potassium: 4 mmol/L (ref 3.5–5.1)
SODIUM: 135 mmol/L (ref 135–145)

## 2018-09-08 MED ORDER — IPRATROPIUM-ALBUTEROL 0.5-2.5 (3) MG/3ML IN SOLN
3.0000 mL | Freq: Once | RESPIRATORY_TRACT | Status: AC
  Administered 2018-09-08: 3 mL via RESPIRATORY_TRACT
  Filled 2018-09-08: qty 3

## 2018-09-08 NOTE — ED Provider Notes (Signed)
[facility] Emergency Department Provider Note  Time seen: 12:09 AM  I have reviewed the triage vital signs and the nursing notes.   HISTORY  Chief Complaint Shortness of Breath    HPI Nathaniel Little is a 72 y.o. male with a past medical history of COPD, hypertension, aortic stenosis, presents to the emergency department for shortness of breath.  According to the patient since around 9 PM this evening he began feeling short of breath.  States he took a nebulizer treatment at home he went outside to see if the cold air would help but he continued to feel short of breath so he called EMS to bring him to the emergency department.  Patient denies any chest pain, states a mild cough but denies any sputum production.  Denies any fever.  No abdominal pain more than his baseline (patient recently had a surgery performed in October and has a JP drain present).  Currently patient appears well, no distress, sitting in bed asking for a remote so he can watch TV.   Past Medical History:  Diagnosis Date  . (HFpEF) heart failure with preserved ejection fraction ([place])    a. 08/2017 Echo: EF 60-65%, Gr1 DD.  [name] Arthritis   . Chest pain    a. 08/2017 Lexiscan MV: EF 53%, no ischemia-->Low risk.  [name] COPD (chronic obstructive pulmonary disease) ([place])   . Depression   . Dyspnea   . Heart murmur   . Hypertension   . Lymphedema   . Moderate aortic stenosis    a. 08/2017 Echo: EF 60-65%, no rwma, GR1 DD, Mod Ao Stenosis, mean grad 56mg. Valve area (VTI): 1.04 cm^2. Mildly dil LA.  . Morbid obesity ([place]   . Prostate cancer ([place] 03/2013  . Sleep apnea with use of continuous positive airway pressure (CPAP)   . Tobacco abuse     Patient Active Problem List   Diagnosis Date Noted  . Hyperlipidemia 07/20/2018  . IPMN (intraductal papillary mucinous neoplasm) 12/24/2017  . Claudication in peripheral vascular disease ([place] 08/22/2017  . Chest pain 08/22/2017  . Pancreatic mass  08/20/2017  . Aortic atherosclerosis ([place] 07/25/2017  . BMI 38.0-38.9,adult 02/09/2017  . Arthritis pain of hip 01/07/2017  . COPD (chronic obstructive pulmonary disease) ([place] 01/07/2017  . GERD (gastroesophageal reflux disease) 01/07/2017  . Inability to attain erection 01/07/2017  . Tobacco smoker within last 12 months 01/07/2017  . Hypertension 01/07/2017  . Heart murmur, systolic 059/16/3846 . Depression 01/06/2017  . Sleep apnea with use of continuous positive airway pressure (CPAP) 01/06/2017  . Personal history of prostate cancer 03/04/2013    Past Surgical History:  Procedure Laterality Date  . ADENOIDECTOMY    . APPENDECTOMY    . EUS N/A 10/13/2017   Procedure: FULL UPPER ENDOSCOPIC ULTRASOUND (EUS) RADIAL;  Surgeon: [name];  [facility];  Service: Gastroenterology;  Laterality: N/A;  . TONSILLECTOMY      Prior to Admission medications   Medication Sig Start Date End Date Taking? Authorizing Provider  albuterol (PROVENTIL HFA;VENTOLIN HFA) 108 (90 Base) MCG/ACT inhaler Inhale 2 puffs into the lungs every 6 (six) hours as needed for wheezing or shortness of breath. 05/01/18   K[employer] NP  blood glucose meter kit and supplies Dispense based on patient and insurance preference. Use up to four times daily as directed. (FOR ICD-10 E10.9, E11.9). 08/18/18   KMikey [employer] NP  budesonide-formoterol ([facility] 160-4.5 MCG/ACT inhaler Inhale 2 puffs into the  lungs 2 (two) times daily. 05/01/18   [name], NP  buPROPion (WELLBUTRIN XL) 300 MG 24 hr tablet Take 1 tablet (300 mg total) by mouth daily. Patient not taking: Reported on 08/18/2018 05/01/18   [name], NP  clopidogrel (PLAVIX) 75 MG tablet Take by mouth. 08/09/18 08/09/19  [provider]  docusate sodium (COLACE) 250 MG capsule Take 1 capsule (250 mg total) by mouth daily as needed for constipation. 08/18/18   [name], NP   Elastic Bandages & Supports (TRUFORM SUPPORT SOCK 8-15MMHG) MISC 2 Devices by Does not apply route daily. Patient not taking: Reported on 08/18/2018 05/01/18   [name], NP  ipratropium (ATROVENT) 0.02 % nebulizer solution Take 2.5 mLs (0.5 mg total) by nebulization every 6 (six) hours as needed for wheezing or shortness of breath. 06/14/16   [provider]  ipratropium-albuterol (DUONEB) 0.5-2.5 (3) MG/3ML SOLN Take 3 mLs by nebulization every 6 (six) hours as needed (wheezing, shortness of breath). 04/20/17   [name], NP  ondansetron (ZOFRAN) 4 MG tablet Take 4 mg by mouth every 8 (eight) hours as needed for nausea or vomiting.    [provider]  pantoprazole (PROTONIX) 40 MG tablet Take by mouth. 08/09/18 08/09/19  [provider]  tiotropium (SPIRIVA HANDIHALER) 18 MCG inhalation capsule Place 1 capsule (18 mcg total) into inhaler and inhale daily. 05/01/18   [name], NP  traZODone (DESYREL) 50 MG tablet Take 0.5 tablets (25 mg total) by mouth at bedtime as needed for sleep. 08/18/18   [name], NP    Allergies  Allergen Reactions  . Codeine Itching    Family History  Problem Relation Age of Onset  . Breast cancer Mother   . Cancer Father        Black Lung  . Cerebral palsy Brother   . Tuberculosis Paternal Uncle   . Prostate cancer Neg Hx   . Kidney cancer Neg Hx   . Bladder Cancer Neg Hx     Social History Social History   Tobacco Use  . Smoking status: Current Every Day Smoker    Packs/day: 0.10    Years: 68.00    Pack years: 6.80    Types: Cigarettes    Last attempt to quit: 04/01/2017    Years since quitting: 1.4  . Smokeless tobacco: Former Systems developer    Types: Chew, [place] date: 05/16/2016  . Tobacco comment: 1/2 cig. day  Substance Use Topics  . Alcohol use: Yes    Alcohol/week: 1.0 standard drinks    Types: 1 Cans of beer per week    Comment: occ  . Drug use: No    Review of  Systems Constitutional: Negative for fever. Cardiovascular: Negative for chest pain. Respiratory: Positive for shortness of breath since 9 PM tonight. Gastrointestinal: Negative for abdominal pain Musculoskeletal: Negative for musculoskeletal complaints Skin: Negative for skin complaints  Neurological: Negative for headache All other ROS negative  ____________________________________________   PHYSICAL EXAM:  VITAL SIGNS: ED Triage Vitals  Enc Vitals Group     BP 09/07/18 2332 128/74     Pulse Rate 09/07/18 2332 86     Resp 09/07/18 2332 (!) 21     Temp 09/07/18 2332 97.6 F (36.4 C)     Temp Source 09/07/18 2332 Oral     SpO2 09/07/18 2332 97 %     Weight 09/07/18 2333 255 lb (115.7 kg)  Height 09/07/18 2333 5' 9"  (1.753 m)     Head Circumference --      Peak Flow --      Pain Score --      Pain Loc --      Pain Edu? --      Excl. in [place]? --    Constitutional: Alert and oriented. Well appearing and in no distress. Eyes: Normal exam ENT   Head: Normocephalic and atraumatic.   Mouth/Throat: Mucous membranes are moist. Cardiovascular: Normal rate, regular rhythm.  2/6 systolic murmur. Respiratory: Slight tachypnea, respiratory rate around 20 breaths/min.  Mild expiratory wheeze in the right lung fields.  No rhonchi or rales. Gastrointestinal: Soft and nontender. No distention.   Musculoskeletal: Nontender with normal range of motion in all extremities.  Neurologic:  Normal speech and language. No gross focal neurologic deficits  Skin:  Skin is warm, dry and intact.  Psychiatric: Mood and affect are normal.   ____________________________________________    EKG  EKG reviewed and interpreted by myself shows a normal sinus rhythm 89 bpm with a narrow QRS, left axis deviation, largely normal intervals, no concerning ST changes.  ____________________________________________    RADIOLOGY  Chest x-ray showed no acute  abnormality  ____________________________________________   INITIAL IMPRESSION / ASSESSMENT AND PLAN / ED COURSE  Pertinent labs & imaging results that were available during my care of the patient were reviewed by me and considered in my medical decision making (see chart for details).  Patient presents to the emergency department with shortness of breath that started around 9 PM tonight.  Currently the patient appears well, no distress, slight expiratory wheeze.  We will check labs, chest x-ray we will dose DuoNeb's and continue to closely monitor.  Patient satting 97% on room air, no distress.  Patient states he feels much better.  Labs are reassuring.  Chest x-ray is clear.  Troponin is negative.  EKG shows no concerning findings.  We will discharge with PCP follow-up.  Patient agreeable to plan of care.  ____________________________________________   FINAL CLINICAL IMPRESSION(S) / ED DIAGNOSES  Dyspnea COPD exacerbation    Harvest Dark, MD 09/08/18 [id number]

## 2018-09-12 DIAGNOSIS — D490 Neoplasm of unspecified behavior of digestive system: Secondary | ICD-10-CM | POA: Diagnosis not present

## 2018-09-12 DIAGNOSIS — R6889 Other general symptoms and signs: Secondary | ICD-10-CM | POA: Diagnosis not present

## 2018-09-14 DIAGNOSIS — E119 Type 2 diabetes mellitus without complications: Secondary | ICD-10-CM | POA: Diagnosis not present

## 2018-09-14 DIAGNOSIS — H3500 Unspecified background retinopathy: Secondary | ICD-10-CM | POA: Diagnosis not present

## 2018-09-14 DIAGNOSIS — E1159 Type 2 diabetes mellitus with other circulatory complications: Secondary | ICD-10-CM | POA: Diagnosis not present

## 2018-09-14 DIAGNOSIS — Z794 Long term (current) use of insulin: Secondary | ICD-10-CM | POA: Diagnosis not present

## 2018-09-18 ENCOUNTER — Ambulatory Visit: Payer: Medicare Other | Admitting: Gastroenterology

## 2018-09-19 DIAGNOSIS — D490 Neoplasm of unspecified behavior of digestive system: Secondary | ICD-10-CM | POA: Diagnosis not present

## 2018-09-19 DIAGNOSIS — R6889 Other general symptoms and signs: Secondary | ICD-10-CM | POA: Diagnosis not present

## 2018-09-26 ENCOUNTER — Emergency Department
Admission: EM | Admit: 2018-09-26 | Discharge: 2018-09-27 | Disposition: A | Discharge status: Home / Self Care | Payer: Medicare Other | Attending: Emergency Medicine | Admitting: Emergency Medicine

## 2018-09-26 ENCOUNTER — Emergency Department: Payer: Medicare Other

## 2018-09-26 ENCOUNTER — Encounter: Payer: Self-pay | Admitting: Emergency Medicine

## 2018-09-26 ENCOUNTER — Other Ambulatory Visit: Payer: Self-pay

## 2018-09-26 DIAGNOSIS — R1032 Left lower quadrant pain: Secondary | ICD-10-CM | POA: Insufficient documentation

## 2018-09-26 DIAGNOSIS — R1111 Vomiting without nausea: Secondary | ICD-10-CM | POA: Diagnosis not present

## 2018-09-26 DIAGNOSIS — F17210 Nicotine dependence, cigarettes, uncomplicated: Secondary | ICD-10-CM | POA: Diagnosis not present

## 2018-09-26 DIAGNOSIS — R197 Diarrhea, unspecified: Secondary | ICD-10-CM | POA: Insufficient documentation

## 2018-09-26 DIAGNOSIS — R112 Nausea with vomiting, unspecified: Secondary | ICD-10-CM | POA: Diagnosis not present

## 2018-09-26 DIAGNOSIS — Z7902 Long term (current) use of antithrombotics/antiplatelets: Secondary | ICD-10-CM | POA: Insufficient documentation

## 2018-09-26 DIAGNOSIS — I10 Essential (primary) hypertension: Secondary | ICD-10-CM | POA: Diagnosis not present

## 2018-09-26 DIAGNOSIS — J9811 Atelectasis: Secondary | ICD-10-CM | POA: Diagnosis not present

## 2018-09-26 DIAGNOSIS — I444 Left anterior fascicular block: Secondary | ICD-10-CM | POA: Diagnosis not present

## 2018-09-26 DIAGNOSIS — R110 Nausea: Secondary | ICD-10-CM | POA: Diagnosis not present

## 2018-09-26 DIAGNOSIS — K760 Fatty (change of) liver, not elsewhere classified: Secondary | ICD-10-CM | POA: Diagnosis not present

## 2018-09-26 DIAGNOSIS — J449 Chronic obstructive pulmonary disease, unspecified: Secondary | ICD-10-CM | POA: Insufficient documentation

## 2018-09-26 DIAGNOSIS — R10817 Generalized abdominal tenderness: Secondary | ICD-10-CM | POA: Diagnosis not present

## 2018-09-26 LAB — COMPREHENSIVE METABOLIC PANEL
ALT: 39 U/L (ref 0–44)
AST: 40 U/L (ref 15–41)
Albumin: 3.7 g/dL (ref 3.5–5.0)
Alkaline Phosphatase: 99 U/L (ref 38–126)
Anion gap: 11 (ref 5–15)
BUN: 18 mg/dL (ref 8–23)
CO2: 22 mmol/L (ref 22–32)
Calcium: 8.8 mg/dL — ABNORMAL LOW (ref 8.9–10.3)
Chloride: 103 mmol/L (ref 98–111)
Creatinine, Ser: 1.06 mg/dL (ref 0.61–1.24)
GFR calc Af Amer: >60 mL/min (ref >60)
Glucose, Bld: 193 mg/dL — ABNORMAL HIGH (ref 70–99)
Potassium: 3.4 mmol/L — ABNORMAL LOW (ref 3.5–5.1)
Sodium: 136 mmol/L (ref 135–145)
Total Bilirubin: 0.5 mg/dL (ref 0.3–1.2)
Total Protein: 7.6 g/dL (ref 6.5–8.1)

## 2018-09-26 LAB — CBC
HCT: 34.9 % — ABNORMAL LOW (ref 39.0–52.0)
Hemoglobin: 11 g/dL — ABNORMAL LOW (ref 13.0–17.0)
MCH: 25.8 pg — ABNORMAL LOW (ref 26.0–34.0)
MCHC: 31.5 g/dL (ref 30.0–36.0)
MCV: 81.7 fL (ref 80.0–100.0)
PLATELETS: 353 10*3/uL (ref 150–400)
RBC: 4.27 MIL/uL (ref 4.22–5.81)
RDW: 16.5 % — ABNORMAL HIGH (ref 11.5–15.5)
WBC: 10.1 10*3/uL (ref 4.0–10.5)
nRBC: 0 % (ref 0.0–0.2)

## 2018-09-26 LAB — TROPONIN I: Troponin I: <0.03 ng/mL (ref <0.03)

## 2018-09-26 LAB — LIPASE, BLOOD: Lipase: 25 U/L (ref 11–51)

## 2018-09-26 MED ORDER — IOPAMIDOL (ISOVUE-300) INJECTION 61%
30.0000 mL | Freq: Once | INTRAVENOUS | Status: AC
  Administered 2018-09-26: 30 mL via ORAL

## 2018-09-26 MED ORDER — ONDANSETRON HCL 4 MG/2ML IJ SOLN
4.0000 mg | Freq: Once | INTRAMUSCULAR | Status: AC | PRN
  Administered 2018-09-26: 4 mg via INTRAVENOUS
  Filled 2018-09-26: qty 2

## 2018-09-26 MED ORDER — SODIUM CHLORIDE 0.9 % IV BOLUS
500.0000 mL | Freq: Once | INTRAVENOUS | Status: AC
  Administered 2018-09-27: 500 mL via INTRAVENOUS

## 2018-09-26 NOTE — ED Notes (Signed)
Pt states he has had increased frequency of loose stools for about a week. Pt states having loose stools is normal for him, but states he has had increase in number of bowel movements recently.

## 2018-09-26 NOTE — ED Triage Notes (Signed)
Pt presents from home via acems with c/o N/V and loose stools that began suddenly today. Pt c/o chills as well. VSS for EMS. Blood sugar 194 for ems.  Pt vomited x1 in ED room. Pt diaphoretic at this time. Pt currently c/o light headedness as well.

## 2018-09-26 NOTE — ED Provider Notes (Signed)
[facility] Emergency Department Provider Note   ____________________________________________   None    (approximate)  I have reviewed the triage vital signs and the nursing notes.   HISTORY  Chief Complaint Emesis; Nausea; and Diarrhea    HPI Nathaniel Little is a 72 y.o. male who presents to the ED from home with a chief complaint of nausea and vomiting.  Patient reports 6 episodes of emesis today.  Denies abdominal pain.  States he always has loose stools but they seem looser today.  Also complains of chills without fever.  Patient had robotic Whipple surgery at [name] on 07/20/2018.  His postoperative period was complicated by hematemesis on 10/26.  He was intubated and taken to IR; no active extravasation was identified.  The common and proper hepatic arteries were found to be dissected and occluded however, patient had robust portal flow to his liver.  He was subsequently taken to the OR by vascular surgery where a stent was placed in his GDA and hepatic arteries.  States he has done well since.  States his drain was removed just last week.  Denies associated chest pain, shortness of breath, dysuria.  Denies abdominal pain or distention.  Denies sick contacts.   Past Medical History:  Diagnosis Date  . (HFpEF) heart failure with preserved ejection fraction (Nathaniel Little)    a. 08/2017 Echo: EF 60-65%, Gr1 DD.  Nathaniel Little Arthritis   . Chest pain    a. 08/2017 Lexiscan MV: EF 53%, no ischemia-->Low risk.  Nathaniel Little COPD (chronic obstructive pulmonary disease) ([place])   . Depression   . Dyspnea   . Heart murmur   . Hypertension   . Lymphedema   . Moderate aortic stenosis    a. 08/2017 Echo: EF 60-65%, no rwma, GR1 DD, Mod Ao Stenosis, mean grad 73mg. Valve area (VTI): 1.04 cm^2. Mildly dil LA.  . Morbid obesity (Nathaniel Little   . Prostate cancer (Nathaniel Little 03/2013  . Sleep apnea with use of continuous positive airway pressure (CPAP)   . Tobacco abuse     Patient Active Problem List   Diagnosis Date Noted  . Hyperlipidemia 07/20/2018  . IPMN (intraductal papillary mucinous neoplasm) 12/24/2017  . Claudication in peripheral vascular disease (Nathaniel Little 08/22/2017  . Chest pain 08/22/2017  . Pancreatic mass 08/20/2017  . Aortic atherosclerosis (Nathaniel Little 07/25/2017  . BMI 38.0-38.9,adult 02/09/2017  . Arthritis pain of hip 01/07/2017  . COPD (chronic obstructive pulmonary disease) (Nathaniel Little 01/07/2017  . GERD (gastroesophageal reflux disease) 01/07/2017  . Inability to attain erection 01/07/2017  . Tobacco smoker within last 12 months 01/07/2017  . Hypertension 01/07/2017  . Heart murmur, systolic 086/76/1951 . Depression 01/06/2017  . Sleep apnea with use of continuous positive airway pressure (CPAP) 01/06/2017  . Personal history of prostate cancer 03/04/2013    Past Surgical History:  Procedure Laterality Date  . ADENOIDECTOMY    . APPENDECTOMY    . EUS N/A 10/13/2017   Procedure: FULL UPPER ENDOSCOPIC ULTRASOUND (EUS) RADIAL;  Surgeon: [name];  [facility];  Service: Gastroenterology;  Laterality: N/A;  . TONSILLECTOMY      Prior to Admission medications   Medication Sig Start Date End Date Taking? Authorizing Provider  albuterol (PROVENTIL HFA;VENTOLIN HFA) 108 (90 Base) MCG/ACT inhaler Inhale 2 puffs into the lungs every 6 (six) hours as needed for wheezing or shortness of breath. 05/01/18   KMikey College NP  blood glucose meter kit and supplies Dispense based on patient and insurance preference.  Use up to four times daily as directed. (FOR ICD-10 E10.9, E11.9). 08/18/18   [name], NP  budesonide-formoterol [facility]) 160-4.5 MCG/ACT inhaler Inhale 2 puffs into the lungs 2 (two) times daily. 05/01/18   [name], NP  buPROPion (WELLBUTRIN XL) 300 MG 24 hr tablet Take 1 tablet (300 mg total) by mouth daily. Patient not taking: Reported on 08/18/2018 05/01/18   [name], NP  clopidogrel (PLAVIX) 75  MG tablet Take by mouth. 08/09/18 08/09/19  [provider]  docusate sodium (COLACE) 250 MG capsule Take 1 capsule (250 mg total) by mouth daily as needed for constipation. 08/18/18   [name], NP  Elastic Bandages & Supports (TRUFORM SUPPORT SOCK 8-15MMHG) MISC 2 Devices by Does not apply route daily. Patient not taking: Reported on 08/18/2018 05/01/18   [name], NP  ipratropium (ATROVENT) 0.02 % nebulizer solution Take 2.5 mLs (0.5 mg total) by nebulization every 6 (six) hours as needed for wheezing or shortness of breath. 06/14/16   [provider]  ipratropium-albuterol (DUONEB) 0.5-2.5 (3) MG/3ML SOLN Take 3 mLs by nebulization every 6 (six) hours as needed (wheezing, shortness of breath). 04/20/17   [name], NP  ondansetron (ZOFRAN ODT) 4 MG disintegrating tablet Take 1 tablet (4 mg total) by mouth every 8 (eight) hours as needed for nausea or vomiting. 09/27/18   [name], MD  ondansetron (ZOFRAN) 4 MG tablet Take 4 mg by mouth every 8 (eight) hours as needed for nausea or vomiting.    [provider]  pantoprazole (PROTONIX) 40 MG tablet Take by mouth. 08/09/18 08/09/19  [provider]  tiotropium (SPIRIVA HANDIHALER) 18 MCG inhalation capsule Place 1 capsule (18 mcg total) into inhaler and inhale daily. 05/01/18   [name], NP  traZODone (DESYREL) 50 MG tablet Take 0.5 tablets (25 mg total) by mouth at bedtime as needed for sleep. 08/18/18   [name], NP    Allergies Codeine  Family History  Problem Relation Age of Onset  . Breast cancer Mother   . Cancer Father        Black Lung  . Cerebral palsy Brother   . Tuberculosis Paternal Uncle   . Prostate cancer Neg Hx   . Kidney cancer Neg Hx   . Bladder Cancer Neg Hx     Social History Social History   Tobacco Use  . Smoking status: Current Every Day Smoker    Packs/day: 0.10    Years: 68.00    Pack years: 6.80    Types:  Cigarettes    Last attempt to quit: 04/01/2017    Years since quitting: 1.4  . Smokeless tobacco: Former Systems developer    Types: Chew, [place] date: 05/16/2016  . Tobacco comment: 1/2 cig. day  Substance Use Topics  . Alcohol use: Yes    Alcohol/week: 1.0 standard drinks    Types: 1 Cans of beer per week    Comment: occ  . Drug use: No    Review of Systems  Constitutional: No fever/chills Eyes: No visual changes. ENT: No sore throat. Cardiovascular: Denies chest pain. Respiratory: Denies shortness of breath. Gastrointestinal: No abdominal pain.  Positive for nausea and vomiting.  Positive for increased frequency of loose stools.  No constipation. Genitourinary: Negative for dysuria. Musculoskeletal: Negative for back pain. Skin: Negative for rash. Neurological: Negative for headaches, focal weakness or numbness.   ____________________________________________   PHYSICAL EXAM:  VITAL SIGNS: ED  Triage Vitals  Enc Vitals Group     BP --      Pulse --      Resp --      Temp 09/26/18 2258 97.9 F (36.6 C)     Temp Source 09/26/18 2258 Oral     SpO2 --      Weight 09/26/18 2243 254 lb 13.6 oz (115.6 kg)     Height 09/26/18 2243 5' 9"  (1.753 m)     Head Circumference --      Peak Flow --      Pain Score 09/26/18 2242 0     Pain Loc --      Pain Edu? --      Excl. in [place]? --     Constitutional: Alert and oriented. Well appearing and in no acute distress. Eyes: Conjunctivae are normal. PERRL. EOMI. Head: Atraumatic. Nose: No congestion/rhinnorhea. Mouth/Throat: Mucous membranes are moist.  Oropharynx non-erythematous. Neck: No stridor.   Cardiovascular: Normal rate, regular rhythm. Grossly normal heart sounds.  Good peripheral circulation. Respiratory: Normal respiratory effort.  No retractions. Lungs CTAB.  Loose cough noted. Gastrointestinal: Soft and nontender to light or deep palpation.  Healing surgical scars.  No distention. No abdominal bruits. No CVA  tenderness. Musculoskeletal: No lower extremity tenderness.  1+ BLE nonpitting edema.  No joint effusions. Neurologic:  Normal speech and language. No gross focal neurologic deficits are appreciated.  Skin:  Skin is warm, dry and intact. No rash noted. Psychiatric: Mood and affect are normal. Speech and behavior are normal.  ____________________________________________   LABS (all labs ordered are listed, but only abnormal results are displayed)  Labs Reviewed  COMPREHENSIVE METABOLIC PANEL - Abnormal; Notable for the following components:      Result Value   Potassium 3.4 (*)    Glucose, Bld 193 (*)    Calcium 8.8 (*)    All other components within normal limits  CBC - Abnormal; Notable for the following components:   Hemoglobin 11.0 (*)    HCT 34.9 (*)    MCH 25.8 (*)    RDW 16.5 (*)    All other components within normal limits  URINALYSIS, COMPLETE (UACMP) WITH MICROSCOPIC - Abnormal; Notable for the following components:   Color, Urine YELLOW (*)    APPearance CLEAR (*)    Protein, ur 30 (*)    All other components within normal limits  C DIFFICILE QUICK SCREEN W PCR REFLEX  GASTROINTESTINAL PANEL BY PCR, STOOL (REPLACES STOOL CULTURE)  LIPASE, BLOOD  TROPONIN I  I-STAT CG4 LACTIC ACID, ED  CG4 I-STAT (LACTIC ACID)   ____________________________________________  EKG  ED ECG REPORT I, Wylee Ogden J, the attending physician, personally viewed and interpreted this ECG.   Date: 09/26/2018  EKG Time: 2251  Rate: 68  Rhythm: normal EKG, normal sinus rhythm  Axis: LAD  Intervals:left anterior fascicular block  ST&T Change: Nonspecific  ____________________________________________  RADIOLOGY  ED MD interpretation: No pneumonia; CT discussed with Dr. Dorann Lodge  Official radiology report(s): Ct Abdomen Pelvis W Contrast  Result Date: 09/27/2018 CLINICAL DATA:  LEFT lower quadrant pain for 1 week. History of pancreatic neoplasm, Whipple surgery and appendectomy.  EXAM: CT ABDOMEN AND PELVIS WITH CONTRAST TECHNIQUE: Multidetector CT imaging of the abdomen and pelvis was performed using the standard protocol following bolus administration of intravenous contrast. CONTRAST:  134m ISOVUE-300 IOPAMIDOL (ISOVUE-300) INJECTION 61% COMPARISON:  MRI abdomen August 18, 2017 and CT abdomen and pelvis July 25, 2017. CT abdomen and pelvis report dated  July 30, 2018 though images are not available for direct comparison. FINDINGS: LOWER CHEST: Minimal dependent atelectasis. Heart size is upper limits of normal. No pericardial effusion. Retained versus refluxed contrast in distal esophagus. HEPATOBILIARY: Diffusely hypodense liver with a hepatobiliary stent to duodenum. PANCREAS: Partial pancreatectomy sparing body and tail. Similar prominent pancreatic duct at 4 mm with coarse calcification. No pancreatic mass or pseudocyst though contrast enhanced MRI is more sensitive (series 2, image 29). SPLEEN: Normal. ADRENALS/URINARY TRACT: Kidneys are orthotopic, demonstrating symmetric enhancement. Punctate RIGHT upper pole nephrolithiasis. 6 mm RIGHT lower pole nephrolithiasis. No hydronephrosis or solid renal masses. Subcentimeter exophytic partially calcified LEFT upper pole mass. Too small to characterize hypodensities bilateral kidneys. The unopacified ureters are normal in course and caliber. Delayed imaging through the kidneys demonstrates symmetric prompt contrast excretion within the proximal urinary collecting system. Urinary bladder is partially distended and unremarkable. Normal adrenal glands. STOMACH/BOWEL: Status post partial duodenectomy and duodenal jejunostomy, gastric jejunostomy. Moderate volume retained large bowel stool. The large bowel are normal in course and caliber without inflammatory changes. VASCULAR/LYMPHATIC: Aortoiliac vessels are normal in course and caliber. Moderate to severe calcific atherosclerosis. Hepatic artery stent. No lymphadenopathy by CT size  criteria. REPRODUCTIVE: Prostate fiducial markers. OTHER: 2.3 x 2.6 x 2.7 cm paraduodenal hypodense mass, additional 2.4 x 1.7 cm hypodense porta hepatis mass. No intraperitoneal free fluid or free air. Large LEFT and small RIGHT fat containing inguinal hernias. MUSCULOSKELETAL: Nonacute. Osteopenia. Grade 1 L4-5 anterolisthesis. Degenerative change of the lumbar spine superimposed on congenital canal narrowing. Severe canal stenosis L3-4 and L4-5. IMPRESSION: 1. Status post Whipple surgery. 18 mm fluid collection tracking to RIGHT abdomen could reflect abscess or pseudocyst/seroma likely along course of prior drain. Inflammatory changes RIGHT abdomen seen with fat necrosis, less likely pancreatitis given today's normal lipase. 2. Hypodense 2.7 cm and 2.4 cm peripancreatic masses seen with pseudocysts or lymphadenopathy/nodal metastasis. For the above findings recommend MRI pancreatic protocol on non emergent basis. 3. Severe hepatic steatosis/hepatocellular disease. 4. Severe L3-4 and L4-5 canal stenosis. 5. Acute findings discussed with and reconfirmed by Dr.Tikia Skilton on 09/27/2018 at 2:48 am. Aortic Atherosclerosis (ICD10-I70.0). Electronically Signed   By: Elon Alas M.D.   On: 09/27/2018 02:50   Dg Chest Port 1 View  Result Date: 09/27/2018 CLINICAL DATA:  Cough.  History of COPD and hypertension. EXAM: PORTABLE CHEST 1 VIEW COMPARISON:  Chest radiograph September 07, 2018 FINDINGS: Low inspiratory portable examination. Stable cardiomegaly. Calcified aortic arch. Mild chronic interstitial changes out pleural effusion or focal consolidation. Bibasilar strandy densities. No pneumothorax. Large body habitus. Osseous structures are non suspicious. IMPRESSION: 1. Low inspiratory examination.  Bibasilar atelectasis. 2. Stable cardiomegaly and chronic interstitial changes. Electronically Signed   By: Elon Alas M.D.   On: 09/27/2018 00:08     ____________________________________________   PROCEDURES  Procedure(s) performed: None  Procedures  Critical Care performed: No  ____________________________________________   INITIAL IMPRESSION / ASSESSMENT AND PLAN / ED COURSE  As part of my medical decision making, I reviewed the following data within the [place] notes reviewed and incorporated, Labs reviewed, EKG interpreted, Old chart reviewed, Radiograph reviewed and Notes from prior ED visits   72 year old male who presents with nausea/ vomiting and increased frequency of loose stools. Differential diagnosis includes, but is not limited to, renal colic, testicular torsion, urinary tract infection/pyelonephritis, prostatitis,  epididymitis, diverticulitis, small bowel obstruction or ileus, colitis, abdominal aortic aneurysm, gastroenteritis, hernia, etc.  Patient feeling better and looks comfortable after  Zofran administration.  No abdominal tenderness to palpation.  However, given patient's complex medical history and recent surgical history, will obtain CT abdomen/pelvis to evaluate for postoperative complications.  Will check a lactic acid given patient's complaint of chills.  Will check C. difficile and Biofire if patient is able to provide a stool specimen.  Awaiting urinalysis.   Clinical Course as of Sep 28 523  Wed Sep 27, 2018  0200 Patient drinking oral contrast.  No complaints of nausea or pain.  No stool yet.  Attempting to provide urine specimen.   [JS]  [id number] Patient resting in no acute distress.  Denies abdominal pain or nausea.  Updated him on CT imaging result.  Patient confirms his drain was on the right side.  As I discussed with the radiologist, doubt active infection or abscess as patient does not have pain nor an elevated white count.  Patient desires to go home as it is Christmas.  Unable to provide stool specimen in the ED.  Will discharge home with prescription for Zofran  and close follow-up with his surgeon and also for pancreatic MRI.  Strict return precautions given.  Patient verbalizes understanding and agrees with plan of care.   [JS]    Clinical Course User Index [JS] [name], MD     ____________________________________________   FINAL CLINICAL IMPRESSION(S) / ED DIAGNOSES  Final diagnoses:  Non-intractable vomiting with nausea, unspecified vomiting type     ED Discharge Orders         Ordered    ondansetron (ZOFRAN ODT) 4 MG disintegrating tablet  Every 8 hours PRN     09/27/18 0332           Note:  This document was prepared using Dragon voice recognition software and may include unintentional dictation errors.    [name], MD 09/27/18 [phone]

## 2018-09-27 ENCOUNTER — Emergency Department: Payer: Medicare Other

## 2018-09-27 DIAGNOSIS — J9811 Atelectasis: Secondary | ICD-10-CM | POA: Diagnosis not present

## 2018-09-27 DIAGNOSIS — K760 Fatty (change of) liver, not elsewhere classified: Secondary | ICD-10-CM | POA: Diagnosis not present

## 2018-09-27 LAB — URINALYSIS, COMPLETE (UACMP) WITH MICROSCOPIC
Bacteria, UA: NONE SEEN
Bilirubin Urine: NEGATIVE
Glucose, UA: NEGATIVE mg/dL
Hgb urine dipstick: NEGATIVE
Ketones, ur: NEGATIVE mg/dL
Leukocytes, UA: NEGATIVE
Nitrite: NEGATIVE
Protein, ur: 30 mg/dL — AB
Specific Gravity, Urine: 1.017 (ref 1.005–1.030)
pH: 6 (ref 5.0–8.0)

## 2018-09-27 LAB — CG4 I-STAT (LACTIC ACID): Lactic Acid, Venous: 1.88 mmol/L (ref 0.5–1.9)

## 2018-09-27 MED ORDER — ONDANSETRON 4 MG PO TBDP: 4.0000 mg | ORAL_TABLET | Freq: Three times a day (TID) | ORAL | 0 refills | Status: DC | PRN

## 2018-09-27 MED ORDER — IOPAMIDOL (ISOVUE-300) INJECTION 61%
100.0000 mL | Freq: Once | INTRAVENOUS | Status: AC | PRN
  Administered 2018-09-27: 100 mL via INTRAVENOUS

## 2018-09-27 MED ORDER — POTASSIUM CHLORIDE CRYS ER 20 MEQ PO TBCR
40.0000 meq | EXTENDED_RELEASE_TABLET | Freq: Once | ORAL | Status: AC
  Administered 2018-09-27: 40 meq via ORAL
  Filled 2018-09-27: qty 2

## 2018-09-27 NOTE — ED Notes (Signed)
This RN informed pt that stool and urine sample was needed when he could provide them. Pt verbalized understanding.

## 2018-09-27 NOTE — Discharge Instructions (Addendum)
1.  You may take Zofran as needed for nausea/vomiting. 2.  Criss Rosales diet for the next 5 to 7 days.  Avoid heavy, greasy, spicy foods or alcohol. 3.  Return to the ER for worsening symptoms, persistent vomiting, difficulty breathing or other concerns.

## 2018-09-27 NOTE — ED Notes (Signed)
Patient transported to CT 

## 2018-09-27 NOTE — ED Notes (Signed)
Lactic I-stat 1.88

## 2018-10-03 DIAGNOSIS — D490 Neoplasm of unspecified behavior of digestive system: Secondary | ICD-10-CM | POA: Diagnosis not present

## 2018-10-07 ENCOUNTER — Other Ambulatory Visit: Payer: Self-pay | Admitting: Nurse Practitioner

## 2018-10-07 DIAGNOSIS — F4310 Post-traumatic stress disorder, unspecified: Secondary | ICD-10-CM

## 2018-10-07 DIAGNOSIS — F5104 Psychophysiologic insomnia: Secondary | ICD-10-CM

## 2018-10-11 ENCOUNTER — Encounter: Payer: Medicare Other | Attending: Surgery | Admitting: *Deleted

## 2018-10-11 ENCOUNTER — Encounter: Payer: Self-pay | Admitting: *Deleted

## 2018-10-11 VITALS — BP 110/74 | Ht 69.0 in | Wt 244.0 lb

## 2018-10-11 DIAGNOSIS — E119 Type 2 diabetes mellitus without complications: Secondary | ICD-10-CM | POA: Insufficient documentation

## 2018-10-11 DIAGNOSIS — Z794 Long term (current) use of insulin: Secondary | ICD-10-CM | POA: Insufficient documentation

## 2018-10-11 DIAGNOSIS — Z713 Dietary counseling and surveillance: Secondary | ICD-10-CM | POA: Insufficient documentation

## 2018-10-11 NOTE — Patient Instructions (Signed)
Check blood sugars 2 x day before breakfast and 2 hrs after supper every day Bring blood sugar records to the next class  Exercise: Continue walking for  30  minutes  7 days a week  Eat 3 meals day,  2-3 snacks a day Space meals 4-6 hours apart Don't skip meals Avoid sugar sweetened drinks (soda, juices)  Return for classes on:

## 2018-10-12 ENCOUNTER — Encounter: Payer: Self-pay | Admitting: *Deleted

## 2018-10-12 NOTE — Progress Notes (Signed)
Diabetes Self-Management Education  Visit Type: First/Initial  Appt. Start Time: 1340 Appt. End Time: 7867  10/11/2018  [name], identified by name and date of birth, is a 73 y.o. male with a diagnosis of Diabetes: Type 2.   ASSESSMENT  Blood pressure 110/74, height 5\' 9"  (1.753 m), weight 244 lb (110.7 kg). Body mass index is 36.03 kg/m.  Diabetes Self-Management Education - 10/11/18 1513      Visit Information   Visit Type  First/Initial      Initial Visit   Diabetes Type  Type 2    Are you currently following a meal plan?  No    Are you taking your medications as prescribed?  Yes    Date Diagnosed  Pt reports 4 weeks - A1C in July was 7.3 %      Health Coping   How would you rate your overall health?  Good      Psychosocial Assessment   Patient Belief/Attitude about Diabetes  Other (comment)   "I was scared at first"   Self-care barriers  None    Self-management support  Doctor's office;Friends    Patient Concerns  Nutrition/Meal planning;Weight Control;Healthy Lifestyle    Special Needs  None    Preferred Learning Style  Auditory;Visual;Hands on    Learning Readiness  Ready    How often do you need to have someone help you when you read instructions, pamphlets, or other written materials from your doctor or pharmacy?  1 - Never    What is the last grade level you completed in school?  GED      Pre-Education Assessment   Patient understands the diabetes disease and treatment process.  Needs Instruction    Patient understands incorporating nutritional management into lifestyle.  Needs Instruction    Patient undertands incorporating physical activity into lifestyle.  Needs Review    Patient understands using medications safely.  Needs Instruction    Patient understands monitoring blood glucose, interpreting and using results  Needs Review    Patient understands prevention, detection, and treatment of acute complications.  Needs Instruction    Patient  understands prevention, detection, and treatment of chronic complications.  Needs Instruction    Patient understands how to develop strategies to address psychosocial issues.  Needs Instruction    Patient understands how to develop strategies to promote health/change behavior.  Needs Instruction      Complications   Last HgB A1C per patient/outside source  7.3 %   05/01/18   How often do you check your blood sugar?  1-2 times/day    Fasting Blood glucose range (mg/dL)  70-129;130-179;180-200   FBG's range from 120-200 mg/dL   Postprandial Blood glucose range (mg/dL)  70-129;130-179;180-200;>200   pp's range from 140-248 mg/dL   Have you had a dilated eye exam in the past 12 months?  Yes    Have you had a dental exam in the past 12 months?  No   no teeth   Are you checking your feet?  Yes    How many days per week are you checking your feet?  7      Dietary Intake   Breakfast  skips or eats banana or oatmeal     Snack (morning)  crackers    Lunch  eats at his apartment complex which varies - meat, starch and vegetable, roll; sometimes cold platter    Snack (afternoon)  sometimes 1/2 candy bar    Dinner  eats in his apartment -  fruit, chicken, salmon, chili made with [place], rice and vegetables, occasional salads    Snack (evening)  crackers    Beverage(s)  water, fruit juice, sugar sweetened sods      Exercise   Exercise Type  Light (walking / raking leaves)    How many days per week to you exercise?  7    How many minutes per day do you exercise?  30    Total minutes per week of exercise  210      Patient Education   Previous Diabetes Education  No    Disease state   Definition of diabetes, type 1 and 2, and the diagnosis of diabetes;Factors that contribute to the development of diabetes    Nutrition management   Role of diet in the treatment of diabetes and the relationship between the three main macronutrients and blood glucose level;Reviewed blood glucose goals for pre and post  meals and how to evaluate the patients' food intake on their blood glucose level.    Physical activity and exercise   Role of exercise on diabetes management, blood pressure control and cardiac health.    Medications  Reviewed patients medication for diabetes, action, purpose, timing of dose and side effects.    Monitoring  Purpose and frequency of SMBG.;Taught/discussed recording of test results and interpretation of SMBG.;Identified appropriate SMBG and/or A1C goals.    Chronic complications  Relationship between chronic complications and blood glucose control    Psychosocial adjustment  Identified and addressed patients feelings and concerns about diabetes      Individualized Goals (developed by patient)   Reducing Risk Lose weight Lead a healthier lifestyle     Outcomes   Expected Outcomes  Demonstrated interest in learning. Expect positive outcomes    Future DMSE  4-6 wks       Individualized Plan for Diabetes Self-Management Training:   Learning Objective:  Patient will have a greater understanding of diabetes self-management. Patient education plan is to attend individual and/or group sessions per assessed needs and concerns.   Plan:   Patient Instructions  Check blood sugars 2 x day before breakfast and 2 hrs after supper every day Bring blood sugar records to the next class Exercise: Continue walking for  30  minutes  7 days a week Eat 3 meals day,  2-3 snacks a day Space meals 4-6 hours apart Don't skip meals Avoid sugar sweetened drinks (soda, juices)  Expected Outcomes:  Demonstrated interest in learning. Expect positive outcomes  Education material provided:  General Meal Planning Guidelines Simple Meal Plan  If problems or questions, patient to contact team via:  Johny Drilling, RN, [place], CDE [phone]  Future DSME appointment: 4-6 wks  November 16, 2018 for Diabetes Class 1

## 2018-10-16 NOTE — Progress Notes (Deleted)
Cardiology Office Note  Date:  10/16/2018   ID:  Nathaniel Little, DOB 1946-03-19, MRN [id number]  PCP:  [name], NP   No chief complaint on file.   HPI:  [name] is a 73 y.o. male  Smoker, COPD Prior significant alcohol intake.  OSA, on CPAP HTN Murmur Prostate cancer 07/25/2017 emergency department via EMS with 1 day of progressive worsening shortness of breath.   increasing productive cough and shortness of breath  Constipated CT ABD: Pancreatic findings :chronic pancreatitis and calcifications, possible pancreatic mass Who presents for follow up of his aortic valve stenosis  Recent echocardiogram results reviewed with him Echo 08/23/2017 EF 60% Aortic valve:  moderate stenosis,  Mean gradient (S): 26 mm Hg.  Peak gradient (S): 53 mm Hg.peak velocity 363 cm/s  Reports he is asymptomatic Eating once a day on purpose to lose weight Down 6 pounds in a one month  Continues to smoke, previously  3 packs/day Down to one cig a day  Stress test from 08/2017 results discussed with him in detail Pharmacological myocardial perfusion imaging study with no significant  ischemia Normal wall motion, EF estimated at 53% No EKG changes concerning for ischemia at peak stress or in recovery. Low risk scan  Denies having significant chest pain Previously having fleeting episodes of chest pain,   Chronic leg pain,  sometimes when he sleeps, sometimes in the day.    EKG personally reviewed by myself on todays visit Shows normal sinus rhythm rate 85 bpm old inferior MI, unable to exclude old anterior MI    PMH:   has a past medical history of (HFpEF) heart failure with preserved ejection fraction (HCC), Arthritis, Chest pain, COPD (chronic obstructive pulmonary disease) ([place]), Depression, Diabetes mellitus without complication ([place]), Dyspnea, Heart murmur, Hypertension, Lymphedema, Moderate aortic stenosis, Morbid obesity ([place]), Prostate cancer ([place]) (03/2013), Sleep  apnea, Sleep apnea with use of continuous positive airway pressure (CPAP), and Tobacco abuse.  PSH:    Past Surgical History:  Procedure Laterality Date  . ADENOIDECTOMY    . APPENDECTOMY    . EUS N/A 10/13/2017   Procedure: FULL UPPER ENDOSCOPIC ULTRASOUND (EUS) RADIAL;  Surgeon: Holly Bodily, MD;  Location: [facility] [place] ENDOSCOPY;  Service: Gastroenterology;  Laterality: N/A;  . PANCREATICODUODENECTOMY    . TONSILLECTOMY      Current Outpatient Medications  Medication Sig Dispense Refill  . albuterol (PROVENTIL HFA;VENTOLIN HFA) 108 (90 Base) MCG/ACT inhaler Inhale 2 puffs into the lungs every 6 (six) hours as needed for wheezing or shortness of breath. 1 Inhaler 0  . aspirin EC 81 MG tablet Take 81 mg by mouth daily.    . blood glucose meter kit and supplies Dispense based on patient and insurance preference. Use up to four times daily as directed. (FOR ICD-10 E10.9, E11.9). 1 each 0  . budesonide-formoterol (SYMBICORT) 80-4.5 MCG/ACT inhaler Inhale 2 puffs into the lungs 2 (two) times daily.    . cloNIDine (CATAPRES) 0.1 MG tablet Take 0.1 mg by mouth 2 (two) times daily.    . clopidogrel (PLAVIX) 75 MG tablet Take 75 mg by mouth daily.     [name] docusate sodium (COLACE) 250 MG capsule Take 1 capsule (250 mg total) by mouth daily as needed for constipation. 10 capsule 0  . hydrochlorothiazide (HYDRODIURIL) 12.5 MG tablet Take 12.5 mg by mouth daily.    [name] ipratropium-albuterol (DUONEB) 0.5-2.5 (3) MG/3ML SOLN Take 3 mLs by nebulization every 6 (six) hours as needed (wheezing, shortness of breath). [place]  mL 3  . metFORMIN (GLUCOPHAGE) 500 MG tablet Take 500 mg by mouth 2 (two) times daily with a meal.    . ondansetron (ZOFRAN) 4 MG tablet Take 4 mg by mouth every 8 (eight) hours as needed for nausea or vomiting.    . pantoprazole (PROTONIX) 40 MG tablet Take 40 mg by mouth daily.     [name] tiotropium (SPIRIVA HANDIHALER) 18 MCG inhalation capsule Place 1 capsule (18 mcg total) into inhaler and  inhale daily. 30 capsule 5  . traZODone (DESYREL) 50 MG tablet TAKE 1/2 TABLET BY MOUTH AT BEDTIME AS NEEDED SLEEP 30 tablet 1   No current facility-administered medications for this visit.      Allergies:   Codeine   Social History:  The patient  reports that he quit smoking about 6 months ago. His smoking use included cigarettes. He has a 6.80 pack-year smoking history. He quit smokeless tobacco use about 2 years ago.  His smokeless tobacco use included chew and snuff. He reports previous alcohol use. He reports that he does not use drugs.   Family History:   family history includes Breast cancer in his mother; Cancer in his father; Cerebral palsy in his brother; Tuberculosis in his paternal uncle.    Review of Systems: Review of Systems  Constitutional: Negative.   Respiratory: Positive for shortness of breath.   Cardiovascular: Negative.   Gastrointestinal: Negative.   Musculoskeletal: Negative.        Leg pain on exertion and at rest  Neurological: Negative.   Psychiatric/Behavioral: Negative.   All other systems reviewed and are negative.    PHYSICAL EXAM: VS:  There were no vitals taken for this visit. , BMI There is no height or weight on file to calculate BMI. Constitutional:  oriented to person, place, and time. No distress.  HENT:  Head: Normocephalic and atraumatic.  Eyes:  no discharge. No scleral icterus.  Neck: Normal range of motion. Neck supple. No JVD present.  Cardiovascular: Normal rate, regular rhythm, normal heart sounds and intact distal pulses. Exam reveals no gallop and no friction rub. No edema No murmur heard. Pulmonary/Chest: Effort normal and breath sounds normal. No stridor. No respiratory distress.  no wheezes.  no rales.  no tenderness.  Abdominal: Soft.  no distension.  no tenderness.  Musculoskeletal: Normal range of motion.  no  tenderness or deformity.  Neurological:  normal muscle tone. Coordination normal. No atrophy Skin: Skin is warm  and dry. No rash noted. not diaphoretic.  Psychiatric:  normal mood and affect. behavior is normal. Thought content normal.      Recent Labs: 09/26/2018: ALT 39; BUN 18; Creatinine, Ser 1.06; Hemoglobin 11.0; Platelets 353; Potassium 3.4; Sodium 136    Lipid Panel Lab Results  Component Value Date   CHOL 178 05/01/2018   HDL 43 05/01/2018   LDLCALC 109 (H) 05/01/2018   TRIG 143 05/01/2018      Wt Readings from Last 3 Encounters:  10/11/18 244 lb (110.7 kg)  09/26/18 254 lb 13.6 oz (115.6 kg)  09/07/18 255 lb (115.7 kg)       ASSESSMENT AND PLAN:  Aortic atherosclerosis ([place]) - Plan: EKG 12-Lead Moderate aortic atherosclerosis noted extending into the common iliac vessels Stressed the need for smoking cessation Goal LDL less than 70 We will suggest he start a statin.  Baseline lipid panel not available  Essential hypertension - Plan: EKG 12-Lead Blood pressure relatively well controlled today No medication changes made Tolerating clonidine 0.1 mg  twice daily in addition to his losartan We will avoid calcium channel blocker given mild lower extremity edema Will avoid diuretics given chronic renal insufficiency and low potassium The other options include hydralazine, nitrates, Cardura  Chronic obstructive pulmonary disease, unspecified COPD type ([place]) - Plan: EKG 12-Lead On 3 inhalers, recommended smoking cessation Previously smoked 4 packs/day Now down to 1 cigarette/day  Aortic valve stenosis  moderate aortic valve stenosis  Repeat echocardiogram in 1 year  Sleep apnea with use of continuous positive airway pressure (CPAP) - Plan: EKG 12-Lead Recommended weight loss, managed by primary care  Tobacco smoker within last 12 months - Plan: EKG 12-Lead Down to 1 cigarette/day  Pancreatic mass Followed by oncology  Leg pain Recent lower extremity arterial Doppler November 2018 Mild LE PAD  Chest pain Stress test with no ischemia  Disposition: Follow-up 1  year   Total encounter time more than 25 minutes  Greater than 50% was spent in counseling and coordination of care with the patient    No orders of the defined types were placed in this encounter.    Signed, Esmond Plants, M.D., Ph.D. 10/16/2018  [place], [place]

## 2018-10-17 ENCOUNTER — Ambulatory Visit: Payer: Medicare Other | Admitting: Cardiovascular Disease

## 2018-10-18 DIAGNOSIS — Z794 Long term (current) use of insulin: Secondary | ICD-10-CM | POA: Diagnosis not present

## 2018-10-18 DIAGNOSIS — E559 Vitamin D deficiency, unspecified: Secondary | ICD-10-CM | POA: Diagnosis not present

## 2018-10-18 DIAGNOSIS — E119 Type 2 diabetes mellitus without complications: Secondary | ICD-10-CM | POA: Diagnosis not present

## 2018-10-25 DIAGNOSIS — H3500 Unspecified background retinopathy: Secondary | ICD-10-CM | POA: Diagnosis not present

## 2018-10-25 DIAGNOSIS — E119 Type 2 diabetes mellitus without complications: Secondary | ICD-10-CM | POA: Diagnosis not present

## 2018-10-25 DIAGNOSIS — E1159 Type 2 diabetes mellitus with other circulatory complications: Secondary | ICD-10-CM | POA: Diagnosis not present

## 2018-10-31 DIAGNOSIS — R6889 Other general symptoms and signs: Secondary | ICD-10-CM | POA: Diagnosis not present

## 2018-10-31 DIAGNOSIS — D490 Neoplasm of unspecified behavior of digestive system: Secondary | ICD-10-CM | POA: Diagnosis not present

## 2018-11-09 ENCOUNTER — Encounter: Payer: Self-pay | Admitting: *Deleted

## 2018-11-16 ENCOUNTER — Encounter: Payer: Self-pay | Admitting: Dietician

## 2018-11-16 ENCOUNTER — Encounter: Payer: Medicare Other | Attending: Surgery | Admitting: Dietician

## 2018-11-16 VITALS — Ht 69.0 in | Wt 240.6 lb

## 2018-11-16 DIAGNOSIS — Z713 Dietary counseling and surveillance: Secondary | ICD-10-CM | POA: Insufficient documentation

## 2018-11-16 DIAGNOSIS — E119 Type 2 diabetes mellitus without complications: Secondary | ICD-10-CM | POA: Diagnosis not present

## 2018-11-16 DIAGNOSIS — Z794 Long term (current) use of insulin: Secondary | ICD-10-CM | POA: Insufficient documentation

## 2018-11-16 NOTE — Progress Notes (Signed)

## 2018-11-20 ENCOUNTER — Other Ambulatory Visit: Payer: Self-pay

## 2018-11-20 ENCOUNTER — Ambulatory Visit (INDEPENDENT_AMBULATORY_CARE_PROVIDER_SITE_OTHER): Payer: Medicare Other | Admitting: Nurse Practitioner

## 2018-11-20 ENCOUNTER — Encounter: Payer: Self-pay | Admitting: Nurse Practitioner

## 2018-11-20 VITALS — BP 126/58 | HR 91 | Temp 98.0°F | Ht 69.0 in | Wt 242.8 lb

## 2018-11-20 DIAGNOSIS — J431 Panlobular emphysema: Secondary | ICD-10-CM

## 2018-11-20 DIAGNOSIS — R140 Abdominal distension (gaseous): Secondary | ICD-10-CM | POA: Diagnosis not present

## 2018-11-20 DIAGNOSIS — I10 Essential (primary) hypertension: Secondary | ICD-10-CM | POA: Diagnosis not present

## 2018-11-20 DIAGNOSIS — G4709 Other insomnia: Secondary | ICD-10-CM

## 2018-11-20 DIAGNOSIS — K219 Gastro-esophageal reflux disease without esophagitis: Secondary | ICD-10-CM | POA: Diagnosis not present

## 2018-11-20 MED ORDER — PANTOPRAZOLE SODIUM 40 MG PO TBEC: 40.0000 mg | DELAYED_RELEASE_TABLET | Freq: Every day | ORAL | 11 refills | Status: DC

## 2018-11-20 MED ORDER — HYDROCHLOROTHIAZIDE 12.5 MG PO TABS: 12.5000 mg | ORAL_TABLET | Freq: Every day | ORAL | 1 refills | Status: DC

## 2018-11-20 MED ORDER — TRAZODONE HCL 100 MG PO TABS: 100.0000 mg | ORAL_TABLET | Freq: Every evening | ORAL | 1 refills | Status: DC | PRN

## 2018-11-20 MED ORDER — SIMETHICONE 80 MG PO CHEW: 80.0000 mg | CHEWABLE_TABLET | Freq: Four times a day (QID) | ORAL | 5 refills | Status: DC | PRN

## 2018-11-20 MED ORDER — CLONIDINE HCL 0.1 MG PO TABS: 0.1000 mg | ORAL_TABLET | Freq: Two times a day (BID) | ORAL | 1 refills | Status: DC

## 2018-11-20 MED ORDER — TIOTROPIUM BROMIDE MONOHYDRATE 18 MCG IN CAPS: 18.0000 ug | ORAL_CAPSULE | Freq: Every day | RESPIRATORY_TRACT | 5 refills | Status: DC

## 2018-11-20 MED ORDER — BUDESONIDE-FORMOTEROL FUMARATE 80-4.5 MCG/ACT IN AERO: 2.0000 | INHALATION_SPRAY | Freq: Two times a day (BID) | RESPIRATORY_TRACT | 2 refills | Status: DC

## 2018-11-20 NOTE — Progress Notes (Signed)
Subjective:    Patient ID: Nathaniel Little, male    DOB: 03-Apr-1946, 73 y.o.   MRN: [id number]  Nathaniel Little is a 73 y.o. male presenting on 11/20/2018 for Diabetes (pt complains of gas, bloating since surgery ) and Hypertension   HPI Diabetes Patient has seen Elpidio Anis, NP - is seeing lifestyle center for DM classes.  Reports cutting back on sugar.  Patient is taking metformin 1,000 mg twice daily with meals. - Patient is checking CBGs with ranges 150-190 listed after breakfast.  No fasting am sugars recorded. He was checking before and after meals, at bedtime for about 3 weeks with reasonable control, consistent with A1c.    Recent Labs    05/01/18 0856  HGBA1C 7.3*   Pruritic skin  Feels this all over on arms, legs, and feet.  Sometimes on bottom.  Insomnia  Pt takes pill to help with sleep, falls asleep well, but wakes again after about 3 hours later.  Is awake for 3 hours and is able to "cat nap" after.  Patient is taking whole tab trazodone 50 mg once daily at bedtime.   Hypertension - He is checking BP at home or outside of clinic.  Readings occasionally up to 150.  Usually in 120s-130s SBP - Current medications: HCTZ 12.5 mg once daily, clonidine 0.1 mg bid, tolerating well without side effects - He is not currently symptomatic. - Pt denies headache, lightheadedness, dizziness, changes in vision, chest tightness/pressure, palpitations, leg swelling, sudden loss of speech or loss of consciousness. - He  reports an exercise routine that includes walking for about 45 minutes, 4-5 days per week. - His diet is moderate to high in salt, moderate in fat, and moderate to high in carbohydrates.   Abdominal bloating Patient presents today with complaints of gas/bloating, increased since Whipple surgery.  Patient continues feeling increased abdominal pressure, walking helps get rid of gas and relieve this pressure.  Patient is not taking any over the counter medication for this.   He continues to take pantoprazole 40 mg once  daily.  Patient is having increased belching and flatulence. - Patient continues to see some blood in BM.  No hemorrhoids, not straining.  Notes this blood 1 x per week inside bowel movement.  Was streaks on bowel.  Cataract and film LEFT - [facility]th [facility] Then will repeat on RIGHT eye  No additional cancer follow-[facility] at [facility] for 6 months.   Patient wishes to donate his body to [facility] at death and is sharing his wishes with [name] today (uniform donor card for anatomical gift program).  Social History   Tobacco Use  . Smoking status: Current Every Day Smoker    Packs/day: 0.10    Years: 68.00    Pack years: 6.80    Types: Cigarettes    Last attempt to quit: 04/01/2018    Years since quitting: 0.6  . Smokeless tobacco: Former Systems developer    Types: Chew, [place] date: 05/16/2016  . Tobacco comment: 2 cig, day  Substance Use Topics  . Alcohol use: Not Currently  . Drug use: No    Review of Systems Per HPI unless specifically indicated above     Objective:    BP (!) 126/58 (BP Location: Left Arm, Patient Position: Sitting, Cuff Size: Large)   Pulse 91   Temp 98 F (36.7 C) (Oral)   Ht 5\' 9"  (1.753 m)   Wt 242 lb 12.8 oz (110.1  kg)   BMI 35.86 kg/m   Wt Readings from Last 3 Encounters:  11/20/18 242 lb 12.8 oz (110.1 kg)  11/16/18 240 lb 9.6 oz (109.1 kg)  10/11/18 244 lb (110.7 kg)    Physical Exam Vitals signs reviewed.  Constitutional:      General: He is awake. He is not in acute distress.    Appearance: He is well-developed.  HENT:     Head: Normocephalic and atraumatic.     Mouth/Throat:     Mouth: Mucous membranes are moist.     Pharynx: Oropharynx is clear.  Eyes:     Pupils: Pupils are equal, round, and reactive to light.  Neck:     Musculoskeletal: Normal range of motion and neck supple.     Vascular: No carotid bruit.  Cardiovascular:     Rate and Rhythm: Normal rate and regular rhythm.      Pulses:          Radial pulses are 2+ on the right side and 2+ on the left side.       Posterior tibial pulses are 1+ on the right side and 1+ on the left side.     Heart sounds: Normal heart sounds, S1 normal and S2 normal.  Pulmonary:     Effort: Pulmonary effort is normal. No respiratory distress.     Breath sounds: Normal breath sounds and air entry.  Abdominal:     General: Abdomen is protuberant. Bowel sounds are normal. There is no distension.     Palpations: Abdomen is soft. There is no mass.     Tenderness: There is no abdominal tenderness. There is no guarding or rebound.     Hernia: No hernia is present.  Skin:    General: Skin is warm and dry.     Capillary Refill: Capillary refill takes less than 2 seconds.     Findings: No lesion or rash.  Neurological:     General: No focal deficit present.     Mental Status: He is alert and oriented to person, place, and time. Mental status is at baseline.  Psychiatric:        Attention and Perception: Attention normal.        Mood and Affect: Mood and affect normal.        Behavior: Behavior normal. Behavior is cooperative.        Thought Content: Thought content normal.        Judgment: Judgment normal.    Results for orders placed or performed during the hospital encounter of 09/26/18  Lipase, blood  Result Value Ref Range   Lipase 25 11 - 51 U/L  Comprehensive metabolic panel  Result Value Ref Range   Sodium 136 135 - 145 mmol/L   Potassium 3.4 (L) 3.5 - 5.1 mmol/L   Chloride 103 98 - 111 mmol/L   CO2 22 22 - 32 mmol/L   Glucose, Bld 193 (H) 70 - 99 mg/dL   BUN 18 8 - 23 mg/dL   Creatinine, Ser 1.06 0.61 - 1.24 mg/dL   Calcium 8.8 (L) 8.9 - 10.3 mg/dL   Total Protein 7.6 6.5 - 8.1 g/dL   Albumin 3.7 3.5 - 5.0 g/dL   AST 40 15 - 41 U/L   ALT 39 0 - 44 U/L   Alkaline Phosphatase 99 38 - 126 U/L   Total Bilirubin 0.5 0.3 - 1.2 mg/dL   GFR calc non Af Amer >60 >60 mL/min   GFR calc Af Amer >  60 >60 mL/min   Anion  gap 11 5 - 15  CBC  Result Value Ref Range   WBC 10.1 4.0 - 10.5 K/uL   RBC 4.27 4.22 - 5.81 MIL/uL   Hemoglobin 11.0 (L) 13.0 - 17.0 g/dL   HCT 34.9 (L) 39.0 - 52.0 %   MCV 81.7 80.0 - 100.0 fL   MCH 25.8 (L) 26.0 - 34.0 pg   MCHC 31.5 30.0 - 36.0 g/dL   RDW 16.5 (H) 11.5 - 15.5 %   Platelets 353 150 - 400 K/uL   nRBC 0.0 0.0 - 0.2 %  Urinalysis, Complete w Microscopic  Result Value Ref Range   Color, Urine YELLOW (A) YELLOW   APPearance CLEAR (A) CLEAR   Specific Gravity, Urine 1.017 1.005 - 1.030   pH 6.0 5.0 - 8.0   Glucose, UA NEGATIVE NEGATIVE mg/dL   Hgb urine dipstick NEGATIVE NEGATIVE   Bilirubin Urine NEGATIVE NEGATIVE   Ketones, ur NEGATIVE NEGATIVE mg/dL   Protein, ur 30 (A) NEGATIVE mg/dL   Nitrite NEGATIVE NEGATIVE   Leukocytes, UA NEGATIVE NEGATIVE   WBC, UA 0-5 0 - 5 WBC/hpf   Bacteria, UA NONE SEEN NONE SEEN   Squamous Epithelial / LPF 0-5 0 - 5   Mucus PRESENT   Troponin I - Add-On to previous collection  Result Value Ref Range   Troponin I <0.03 <0.03 ng/mL  CG4 I-STAT (Lactic acid)  Result Value Ref Range   Lactic Acid, Venous 1.88 0.5 - 1.9 mmol/L      Assessment & Plan:   Problem List Items Addressed This Visit      Cardiovascular and Mediastinum   Hypertension Controlled hypertension.  BP goal < 130/80.  Pt is working on lifestyle modifications.  Taking medications tolerating well without side effects.   Plan: 1. Continue taking medications without changes - refills sent 2. Obtain labs next visit - recent labs with normal kidney function  3. Encouraged heart healthy diet and increasing exercise to 30 minutes most days of the week. 4. Check BP 1-2 x per week at home, keep log, and bring to clinic at next appointment. 5. Follow up 3 months.     Relevant Medications   hydrochlorothiazide (HYDRODIURIL) 12.5 MG tablet   cloNIDine (CATAPRES) 0.1 MG tablet     Respiratory   COPD (chronic obstructive pulmonary disease) (HCC) Stable today on  exam.  Medications tolerated without side effects.  Continue at current doses.  Refills provided.  Due for repeat PFT in next 6 mos  - pt declines today. Followup 3 months.    Relevant Medications   tiotropium (SPIRIVA HANDIHALER) 18 MCG inhalation capsule   budesonide-formoterol (SYMBICORT) 80-4.5 MCG/ACT inhaler     Digestive   GERD (gastroesophageal reflux disease) Currently uncontrolled with regular gas and belching.  Continue pantoprazole 40 mg once daily. START simethicone 80 mg once daily prn. - Avoid diet triggers. Reviewed need to seek care if globus sensation, difficulty swallowing, s/sx of GI bleed. - Follow up as needed and in 3 months.    Relevant Medications   simethicone (GAS-X) 80 MG chewable tablet   pantoprazole (PROTONIX) 40 MG tablet    Other Visit Diagnoses    Other insomnia    -  Primary Patient with ongoing insomnia, not relieved by trazodone.  Increase dose to 100 mg once daily.  May need to consider future drug holidays to continue efficacy of lower doses in lieu of increasing dose further   Relevant Medications  traZODone (DESYREL) 100 MG tablet   Abdominal bloating     Likely related to gas - no other known cause. Patient has chronically protuberant abdomen.  Follow-up prn.  May need to follow-up with GI after whipple if continues to have bloating symptoms.   Relevant Medications   simethicone (GAS-X) 80 MG chewable tablet      Meds ordered this encounter  Medications  . traZODone (DESYREL) 100 MG tablet    Sig: Take 1 tablet (100 mg total) by mouth at bedtime as needed for sleep.    Dispense:  90 tablet    Refill:  1    Order Specific Question:   Supervising Provider    Answer:   Olin Hauser [[id number]]  . simethicone (GAS-X) 80 MG chewable tablet    Sig: Chew 1 tablet (80 mg total) by mouth every 6 (six) hours as needed for flatulence.    Dispense:  30 tablet    Refill:  5    Order Specific Question:   Supervising Provider    Answer:    Olin Hauser [[id number]]  . pantoprazole (PROTONIX) 40 MG tablet    Sig: Take 1 tablet (40 mg total) by mouth daily.    Dispense:  30 tablet    Refill:  11    Order Specific Question:   Supervising Provider    Answer:   Olin Hauser [[id number]]  . tiotropium (SPIRIVA HANDIHALER) 18 MCG inhalation capsule    Sig: Place 1 capsule (18 mcg total) into inhaler and inhale daily.    Dispense:  30 capsule    Refill:  5    Order Specific Question:   Supervising Provider    Answer:   Olin Hauser [[id number]]  . budesonide-formoterol (SYMBICORT) 80-4.5 MCG/ACT inhaler    Sig: Inhale 2 puffs into the lungs 2 (two) times daily.    Dispense:  1 Inhaler    Refill:  2    Order Specific Question:   Supervising Provider    Answer:   Olin Hauser [[id number]]  . hydrochlorothiazide (HYDRODIURIL) 12.5 MG tablet    Sig: Take 1 tablet (12.5 mg total) by mouth daily.    Dispense:  90 tablet    Refill:  1    Order Specific Question:   Supervising Provider    Answer:   Olin Hauser [[id number]]  . cloNIDine (CATAPRES) 0.1 MG tablet    Sig: Take 1 tablet (0.1 mg total) by mouth 2 (two) times daily.    Dispense:  180 tablet    Refill:  1    Order Specific Question:   Supervising Provider    Answer:   Olin Hauser [[id number]]    Follow up plan: Return in about 3 months (around 02/18/2019) for hypertension, GERD.  Cassell Smiles, DNP, AGPCNP-BC Adult Gerontology Primary Care Nurse Practitioner [facility] 11/20/2018, 1:38 PM

## 2018-11-20 NOTE — Patient Instructions (Addendum)
Nathaniel Little,   Thank you for coming in to clinic today.  1. Itching - start using lotion regularly after showering. - For lotions: try Cetaphil, Aquaphor, or Dove for moisturization  2. Continue with [name] for your diabetes.  3. Insomnia: may increase trazodone to 100 mg once daily at bedtime.  4. Continue blood pressure medications without changes.  5. Abdominal bloating: START simethicone (Gas-Ex) daily as needed.  Please schedule a follow-up appointment with Cassell Smiles, AGNP. No follow-ups on file.  If you have any other questions or concerns, please feel free to call the clinic or send a message through [name]. You may also schedule an earlier appointment if necessary.  You will receive a survey after today's visit either digitally by e-mail or paper by [name]. Your experiences and feedback matter to [name].  Please respond so we know how we are doing as we provide care for you.   Cassell Smiles, DNP, AGNP-BC Adult Gerontology Nurse Practitioner [place]

## 2018-11-23 ENCOUNTER — Encounter: Payer: Self-pay | Admitting: Nurse Practitioner

## 2018-11-23 ENCOUNTER — Encounter: Payer: Medicare Other | Admitting: Dietician

## 2018-11-23 ENCOUNTER — Encounter: Payer: Self-pay | Admitting: Dietician

## 2018-11-23 VITALS — Wt 240.2 lb

## 2018-11-23 DIAGNOSIS — Z713 Dietary counseling and surveillance: Secondary | ICD-10-CM | POA: Diagnosis not present

## 2018-11-23 DIAGNOSIS — E119 Type 2 diabetes mellitus without complications: Secondary | ICD-10-CM | POA: Diagnosis not present

## 2018-11-23 DIAGNOSIS — Z794 Long term (current) use of insulin: Secondary | ICD-10-CM | POA: Diagnosis not present

## 2018-11-23 NOTE — Progress Notes (Signed)

## 2018-11-28 ENCOUNTER — Other Ambulatory Visit: Payer: Self-pay

## 2018-11-28 ENCOUNTER — Encounter
Admission: RE | Disposition: A | Discharge status: Home / Self Care | Payer: Self-pay | Source: Ambulatory Visit | Attending: Ophthalmology

## 2018-11-28 ENCOUNTER — Ambulatory Visit
Admission: RE | Admit: 2018-11-28 | Discharge: 2018-11-28 | Disposition: A | Discharge status: Home / Self Care | Payer: Medicare Other | Source: Ambulatory Visit | Attending: Ophthalmology | Admitting: Ophthalmology

## 2018-11-28 ENCOUNTER — Encounter: Payer: Self-pay | Admitting: *Deleted

## 2018-11-28 ENCOUNTER — Ambulatory Visit: Payer: Medicare Other | Admitting: Anesthesiology

## 2018-11-28 DIAGNOSIS — Z7982 Long term (current) use of aspirin: Secondary | ICD-10-CM | POA: Diagnosis not present

## 2018-11-28 DIAGNOSIS — E1136 Type 2 diabetes mellitus with diabetic cataract: Secondary | ICD-10-CM | POA: Insufficient documentation

## 2018-11-28 DIAGNOSIS — I10 Essential (primary) hypertension: Secondary | ICD-10-CM | POA: Diagnosis not present

## 2018-11-28 DIAGNOSIS — F329 Major depressive disorder, single episode, unspecified: Secondary | ICD-10-CM | POA: Diagnosis not present

## 2018-11-28 DIAGNOSIS — Z7984 Long term (current) use of oral hypoglycemic drugs: Secondary | ICD-10-CM | POA: Diagnosis not present

## 2018-11-28 DIAGNOSIS — E1151 Type 2 diabetes mellitus with diabetic peripheral angiopathy without gangrene: Secondary | ICD-10-CM | POA: Insufficient documentation

## 2018-11-28 DIAGNOSIS — G4730 Sleep apnea, unspecified: Secondary | ICD-10-CM | POA: Insufficient documentation

## 2018-11-28 DIAGNOSIS — H2512 Age-related nuclear cataract, left eye: Secondary | ICD-10-CM | POA: Insufficient documentation

## 2018-11-28 DIAGNOSIS — K219 Gastro-esophageal reflux disease without esophagitis: Secondary | ICD-10-CM | POA: Insufficient documentation

## 2018-11-28 DIAGNOSIS — Z79899 Other long term (current) drug therapy: Secondary | ICD-10-CM | POA: Diagnosis not present

## 2018-11-28 DIAGNOSIS — J449 Chronic obstructive pulmonary disease, unspecified: Secondary | ICD-10-CM | POA: Insufficient documentation

## 2018-11-28 DIAGNOSIS — Z885 Allergy status to narcotic agent status: Secondary | ICD-10-CM | POA: Insufficient documentation

## 2018-11-28 DIAGNOSIS — E119 Type 2 diabetes mellitus without complications: Secondary | ICD-10-CM | POA: Diagnosis not present

## 2018-11-28 HISTORY — DX: Wheezing: R06.2

## 2018-11-28 HISTORY — PX: CATARACT EXTRACTION W/PHACO: SHX586

## 2018-11-28 HISTORY — DX: Tremor, unspecified: R25.1

## 2018-11-28 HISTORY — DX: Personal history of other specified conditions: Z87.898

## 2018-11-28 LAB — GLUCOSE, CAPILLARY: Glucose-Capillary: 143 mg/dL — ABNORMAL HIGH (ref 70–99)

## 2018-11-28 SURGERY — PHACOEMULSIFICATION, CATARACT, WITH IOL INSERTION
Anesthesia: Monitor Anesthesia Care | Site: Eye | Laterality: Left

## 2018-11-28 MED ORDER — MIDAZOLAM HCL 2 MG/2ML IJ SOLN
INTRAMUSCULAR | Status: AC
  Filled 2018-11-28: qty 2

## 2018-11-28 MED ORDER — EPINEPHRINE PF 1 MG/ML IJ SOLN
INTRAMUSCULAR | Status: AC
  Filled 2018-11-28: qty 1

## 2018-11-28 MED ORDER — LIDOCAINE HCL (PF) 4 % IJ SOLN
INTRAOCULAR | Status: DC | PRN
  Administered 2018-11-28: 2 mL via OPHTHALMIC

## 2018-11-28 MED ORDER — SODIUM CHLORIDE 0.9 % IV SOLN
INTRAVENOUS | Status: DC
  Administered 2018-11-28: 09:00:00 via INTRAVENOUS

## 2018-11-28 MED ORDER — POVIDONE-IODINE 5 % OP SOLN
OPHTHALMIC | Status: AC
  Filled 2018-11-28: qty 30

## 2018-11-28 MED ORDER — MOXIFLOXACIN HCL 0.5 % OP SOLN: 1.0000 [drp] | OPHTHALMIC | Status: DC | PRN

## 2018-11-28 MED ORDER — ARMC OPHTHALMIC DILATING DROPS
1.0000 "application " | OPHTHALMIC | Status: AC
  Administered 2018-11-28 (×3): 1 via OPHTHALMIC

## 2018-11-28 MED ORDER — FENTANYL CITRATE (PF) 100 MCG/2ML IJ SOLN: 25.0000 ug | INTRAMUSCULAR | Status: DC | PRN

## 2018-11-28 MED ORDER — NA CHONDROIT SULF-NA HYALURON 40-17 MG/ML IO SOLN
INTRAOCULAR | Status: DC | PRN
  Administered 2018-11-28: 1 mL via INTRAOCULAR

## 2018-11-28 MED ORDER — ONDANSETRON HCL 4 MG/2ML IJ SOLN: 4.0000 mg | Freq: Once | INTRAMUSCULAR | Status: DC | PRN

## 2018-11-28 MED ORDER — CARBACHOL 0.01 % IO SOLN
INTRAOCULAR | Status: DC | PRN
  Administered 2018-11-28: .5 mL via INTRAOCULAR

## 2018-11-28 MED ORDER — MIDAZOLAM HCL 2 MG/2ML IJ SOLN
INTRAMUSCULAR | Status: DC | PRN
  Administered 2018-11-28 (×2): 0.5 mg via INTRAVENOUS

## 2018-11-28 MED ORDER — FENTANYL CITRATE (PF) 100 MCG/2ML IJ SOLN
INTRAMUSCULAR | Status: AC
  Filled 2018-11-28: qty 2

## 2018-11-28 MED ORDER — POVIDONE-IODINE 5 % OP SOLN
OPHTHALMIC | Status: DC | PRN
  Administered 2018-11-28: 1 via OPHTHALMIC

## 2018-11-28 MED ORDER — TETRACAINE HCL 0.5 % OP SOLN
OPHTHALMIC | Status: AC
  Filled 2018-11-28: qty 4

## 2018-11-28 MED ORDER — ARMC OPHTHALMIC DILATING DROPS
OPHTHALMIC | Status: AC
  Administered 2018-11-28: 1 via OPHTHALMIC
  Filled 2018-11-28: qty 0.5

## 2018-11-28 MED ORDER — MOXIFLOXACIN HCL 0.5 % OP SOLN
OPHTHALMIC | Status: AC
  Filled 2018-11-28: qty 3

## 2018-11-28 MED ORDER — FENTANYL CITRATE (PF) 100 MCG/2ML IJ SOLN
INTRAMUSCULAR | Status: DC | PRN
  Administered 2018-11-28: 50 ug via INTRAVENOUS

## 2018-11-28 MED ORDER — LIDOCAINE HCL (PF) 4 % IJ SOLN
INTRAMUSCULAR | Status: AC
  Filled 2018-11-28: qty 5

## 2018-11-28 MED ORDER — NA CHONDROIT SULF-NA HYALURON 40-17 MG/ML IO SOLN
INTRAOCULAR | Status: AC
  Filled 2018-11-28: qty 1

## 2018-11-28 MED ORDER — EPINEPHRINE PF 1 MG/ML IJ SOLN
INTRAOCULAR | Status: DC | PRN
  Administered 2018-11-28: 1 mL via OPHTHALMIC

## 2018-11-28 MED ORDER — TETRACAINE HCL 0.5 % OP SOLN
1.0000 [drp] | OPHTHALMIC | Status: AC | PRN
  Administered 2018-11-28 (×3): 1 [drp] via OPHTHALMIC

## 2018-11-28 MED ORDER — MOXIFLOXACIN HCL 0.5 % OP SOLN
OPHTHALMIC | Status: DC | PRN
  Administered 2018-11-28: .2 mL via OPHTHALMIC

## 2018-11-28 SURGICAL SUPPLY — 16 items
GLOVE BIO SURGEON STRL SZ8 (GLOVE) ×2 IMPLANT
GLOVE BIOGEL M 6.5 STRL (GLOVE) ×2 IMPLANT
GLOVE SURG LX 8.0 MICRO (GLOVE) ×1
GLOVE SURG LX STRL 8.0 MICRO (GLOVE) ×1 IMPLANT
GOWN STRL REUS W/ TWL LRG LVL3 (GOWN DISPOSABLE) ×2 IMPLANT
GOWN STRL REUS W/TWL LRG LVL3 (GOWN DISPOSABLE) ×2
LABEL CATARACT MEDS ST (LABEL) ×2 IMPLANT
LENS IOL TECNIS ITEC 21.0 (Intraocular Lens) ×1 IMPLANT
PACK CATARACT (MISCELLANEOUS) ×2 IMPLANT
PACK CATARACT BRASINGTON LX (MISCELLANEOUS) ×2 IMPLANT
PACK EYE AFTER SURG (MISCELLANEOUS) ×2 IMPLANT
SOL BSS BAG (MISCELLANEOUS) ×2
SOLUTION BSS BAG (MISCELLANEOUS) ×1 IMPLANT
SYR 5ML LL (SYRINGE) ×2 IMPLANT
WATER STERILE IRR 250ML POUR (IV SOLUTION) ×2 IMPLANT
WIPE NON LINTING 3.25X3.25 (MISCELLANEOUS) ×2 IMPLANT

## 2018-11-28 NOTE — Anesthesia Preprocedure Evaluation (Signed)
Anesthesia Evaluation  Patient identified by MRN, date of birth, ID band Patient awake    Reviewed: Allergy & Precautions, H&P , NPO status , Patient's Chart, lab work & pertinent test results, reviewed documented beta blocker date and time   Airway Mallampati: II   Neck ROM: full    Dental  (+) Poor Dentition, Teeth Intact   Pulmonary neg pulmonary ROS, sleep apnea and Continuous Positive Airway Pressure Ventilation , COPD, Current Smoker, former smoker,    Pulmonary exam normal        Cardiovascular Exercise Tolerance: Poor hypertension, + Peripheral Vascular Disease and + Orthopnea  Normal cardiovascular exam+ Valvular Problems/Murmurs  Rhythm:regular Rate:Normal     Neuro/Psych PSYCHIATRIC DISORDERS negative neurological ROS  negative psych ROS   GI/Hepatic negative GI ROS, Neg liver ROS, GERD  Medicated,  Endo/Other  negative endocrine ROSdiabetes  Renal/GU negative Renal ROS  negative genitourinary   Musculoskeletal   Abdominal   Peds  Hematology negative hematology ROS (+)   Anesthesia Other Findings Past Medical History: No date: Arthritis 03/2013: Cancer ([place])     Comment:  Prostate No date: COPD (chronic obstructive pulmonary disease) (HCC) No date: Depression No date: Heart disease No date: Heart murmur No date: Hypertension No date: Lymphedema No date: Sleep apnea with use of continuous positive airway pressure  (CPAP) Past Surgical History: No date: ADENOIDECTOMY No date: APPENDECTOMY No date: TONSILLECTOMY BMI    Body Mass Index:  40.61 kg/m     Reproductive/Obstetrics negative OB ROS                             Anesthesia Physical  Anesthesia Plan  ASA: III  Anesthesia Plan: MAC   Post-op Pain Management:    Induction:   PONV Risk Score and Plan:   Airway Management Planned: Nasal Cannula  Additional Equipment:   Intra-op Plan:   Post-operative  Plan:   Informed Consent: I have reviewed the patients History and Physical, chart, labs and discussed the procedure including the risks, benefits and alternatives for the proposed anesthesia with the patient or authorized representative who has indicated his/her understanding and acceptance.     Dental Advisory Given  Plan Discussed with: CRNA  Anesthesia Plan Comments:         Anesthesia Quick Evaluation

## 2018-11-28 NOTE — Anesthesia Postprocedure Evaluation (Signed)
Anesthesia Post Note  Patient: Nathaniel Little  Procedure(s) Performed: CATARACT EXTRACTION PHACO AND INTRAOCULAR LENS PLACEMENT (IOC) LEFT, DIABETIC (Left Eye)  Patient location during evaluation: Phase II Anesthesia Type: MAC Level of consciousness: awake, awake and alert, oriented and patient cooperative Pain management: satisfactory to patient Vital Signs Assessment: post-procedure vital signs reviewed and stable Respiratory status: spontaneous breathing Cardiovascular status: stable Anesthetic complications: no     Last Vitals:  Vitals:   11/28/18 0830 11/28/18 0906  BP:    Pulse:  84  Resp:  14  Temp:    SpO2: 97% 97%    Last Pain:  Vitals:   11/28/18 0906  TempSrc: Oral  PainSc: 0-No pain                 Philbert Riser

## 2018-11-28 NOTE — Anesthesia Post-op Follow-up Note (Signed)
Anesthesia QCDR form completed.        

## 2018-11-28 NOTE — Discharge Instructions (Addendum)
Eye Surgery Discharge Instructions  Expect mild scratchy sensation or mild soreness. DO NOT RUB YOUR EYE!  The day of surgery:  Minimal physical activity, but bed rest is not required  No reading, computer work, or close hand work  No bending, lifting, or straining.  May watch TV  For 24 hours:  No driving, legal decisions, or alcoholic beverages  Safety precautions  Eat anything you prefer: It is better to start with liquids, then soup then solid foods.  Solar shield eyeglasses should be worn for comfort in the sunlight/patch while sleeping  Resume all regular medications including aspirin or Coumadin if these were discontinued prior to surgery. You may shower, bathe, shave, or wash your hair. Tylenol may be taken for mild discomfort. Follow Dr. Inda Coke discharge instruction sheet as reviewed.  Call your doctor if you experience significant pain, nausea, or vomiting, fever > 101 or other signs of infection. [phone] or [phone] Specific instructions:  Follow-up Information    Birder Robson, MD Follow up.   Specialty:  Ophthalmology Why:  11/29/18 @ 8:05 am Contact information: [address] [place] [address] [phone]

## 2018-11-28 NOTE — Transfer of Care (Signed)
Immediate Anesthesia Transfer of Care Note  Patient: Nathaniel Little  Procedure(s) Performed: CATARACT EXTRACTION PHACO AND INTRAOCULAR LENS PLACEMENT (IOC) LEFT, DIABETIC (Left Eye)  Patient Location: PACU  Anesthesia Type:MAC  Level of Consciousness: awake, alert  and oriented  Airway & Oxygen Therapy: Patient Spontanous Breathing  Post-op Assessment: Report given to RN and Post -op Vital signs reviewed and stable  Post vital signs: Reviewed and stable  Last Vitals:  Vitals Value Taken Time  BP    Temp    Pulse    Resp    SpO2      Last Pain:  Vitals:   11/28/18 0906  TempSrc: Oral  PainSc: 0-No pain         Complications: No apparent anesthesia complications

## 2018-11-28 NOTE — Op Note (Signed)
PREOPERATIVE DIAGNOSIS:  Nuclear sclerotic cataract of the left eye.   POSTOPERATIVE DIAGNOSIS:  Nuclear sclerotic cataract of the left eye.   OPERATIVE PROCEDURE: Procedure(s): CATARACT EXTRACTION PHACO AND INTRAOCULAR LENS PLACEMENT (IOC) LEFT, DIABETIC   SURGEON:  Birder Robson, MD.   ANESTHESIA:  Anesthesiologist: Alvin Critchley, MD CRNA: Nelda Marseille, CRNA; Philbert Riser, CRNA  1.      Managed anesthesia care. 2.     0.53ml of Shugarcaine was instilled following the paracentesis   COMPLICATIONS:  None.   TECHNIQUE:   Stop and chop   DESCRIPTION OF PROCEDURE:  The patient was examined and consented in the preoperative holding area where the aforementioned topical anesthesia was applied to the left eye and then brought back to the Operating Room where the left eye was prepped and draped in the usual sterile ophthalmic fashion and a lid speculum was placed. A paracentesis was created with the side port blade and the anterior chamber was filled with viscoelastic. A near clear corneal incision was performed with the steel keratome. A continuous curvilinear capsulorrhexis was performed with a cystotome followed by the capsulorrhexis forceps. Hydrodissection and hydrodelineation were carried out with BSS on a blunt cannula. The lens was removed in a stop and chop  technique and the remaining cortical material was removed with the irrigation-aspiration handpiece. The capsular bag was inflated with viscoelastic and the Technis ZCB00 lens was placed in the capsular bag without complication. The remaining viscoelastic was removed from the eye with the irrigation-aspiration handpiece. The wounds were hydrated. The anterior chamber was flushed with Miostat and the eye was inflated to physiologic pressure. 0.33ml Vigamox was placed in the anterior chamber. The wounds were found to be water tight. The eye was dressed with Vigamox. The patient was given protective glasses to wear throughout the day and a  shield with which to sleep tonight. The patient was also given drops with which to begin a drop regimen today and will follow-up with me in one day. Implant Name Type Inv. Item Serial No. Manufacturer Lot No. LRB No. Used  LENS IOL DIOP 21.0 - [id number] Intraocular Lens LENS IOL DIOP 21.0 [id number] AMO  Left 1    Procedure(s) with comments: CATARACT EXTRACTION PHACO AND INTRAOCULAR LENS PLACEMENT (IOC) LEFT, DIABETIC (Left) - [name]  00:56 CDE 9.44 Fluid pack lot # [id number]  Electronically signed: Birder Robson 11/28/2018 9:06 AM

## 2018-11-28 NOTE — H&P (Signed)
All labs reviewed. Abnormal studies sent to patients PCP when indicated.  Previous H&P reviewed, patient examined, there are NO CHANGES.  Nathaniel Lovern Porfilio2/25/20208:33 AM

## 2018-11-30 ENCOUNTER — Telehealth: Payer: Self-pay | Admitting: Dietician

## 2018-11-30 ENCOUNTER — Ambulatory Visit: Payer: Medicare Other

## 2018-11-30 NOTE — Telephone Encounter (Signed)
Called patient to reschedule his missed class from today. Left a voicemail message requesting a call back, left date for next class 3.

## 2018-11-30 NOTE — Telephone Encounter (Signed)
Patient returned phone call, rescheduled class 3 for 01/25/19; he is unable to come on 12/28/18 due to having another cataract surgery.

## 2018-12-25 ENCOUNTER — Telehealth: Payer: Self-pay | Admitting: *Deleted

## 2018-12-25 NOTE — Telephone Encounter (Signed)
Received call from patient asking about diabetes classes. Informed him that as of today we are still having classes but the numbers are small. His last class is scheduled for April 23. Informed him that we would call and let him know if future classes will be cancelled and rescheduled.

## 2018-12-26 ENCOUNTER — Ambulatory Visit: Admit: 2018-12-26 | Payer: Medicare Other | Admitting: Ophthalmology

## 2018-12-26 SURGERY — PHACOEMULSIFICATION, CATARACT, WITH IOL INSERTION
Anesthesia: Choice | Laterality: Right

## 2019-01-16 ENCOUNTER — Telehealth: Payer: Self-pay | Admitting: Cardiovascular Disease

## 2019-01-16 NOTE — Telephone Encounter (Signed)
Virtual Visit Pre-Appointment Phone Call  Steps For Call:  1. Confirm consent - "In the setting of the current Covid19 crisis, you are scheduled for a (phone or video) visit with your provider on (date) at (time).  Just as we do with many in-office visits, in order for you to participate in this visit, we must obtain consent.  If you'd like, I can send this to your mychart (if signed up) or email for you to review.  Otherwise, I can obtain your verbal consent now.  All virtual visits are billed to your insurance company just like a normal visit would be.  By agreeing to a virtual visit, we'd like you to understand that the technology does not allow for your provider to perform an examination, and thus may limit your provider's ability to fully assess your condition.  Finally, though the technology is pretty good, we cannot assure that it will always work on either your or our end, and in the setting of a video visit, we may have to convert it to a phone-only visit.  In either situation, we cannot ensure that we have a secure connection.  Are you willing to proceed?" STAFF: Did the patient verbally acknowledge consent to telehealth visit? Document YES/NO here: Yes   2. Confirm the BEST phone number to call the day of the visit by including in appointment notes  3. Give patient instructions for WebEx/MyChart download to smartphone as below or Doximity/Doxy.me if video visit (depending on what platform provider is using)  4. Advise patient to be prepared with their blood pressure, heart rate, weight, any heart rhythm information, their current medicines, and a piece of paper and pen handy for any instructions they may receive the day of their visit  5. Inform patient they will receive a phone call 15 minutes prior to their appointment time (may be from unknown caller ID) so they should be prepared to answer  6. Confirm that appointment type is correct in Epic appointment notes (VIDEO vs  PHONE)     TELEPHONE CALL NOTE  Nathaniel Little has been deemed a candidate for a follow-up tele-health visit to limit community exposure during the Covid-19 pandemic. I spoke with the patient via phone to ensure availability of phone/video source, confirm preferred email & phone number, and discuss instructions and expectations.  I reminded Nathaniel Little to be prepared with any vital sign and/or heart rhythm information that could potentially be obtained via home monitoring, at the time of his visit. I reminded Nathaniel Little to expect a phone call at the time of his visit if his visit.  [name] 01/16/2019 4:07 PM   INSTRUCTIONS FOR DOWNLOADING THE [place] APP TO SMARTPHONE  - If Apple, ask patient to go to [employer] and type in WebEx in the search bar. [employer], the blue/green circle. If Android, go to [employer] and type in [employer] in the search bar. The app is free but as with any other app downloads, their phone may require them to verify saved payment information or Apple/Android password.  - The patient does NOT have to create an account. - On the day of the visit, the assist will walk the patient through joining the meeting with the meeting number/password.  INSTRUCTIONS FOR DOWNLOADING THE MYCHART APP TO SMARTPHONE  - The patient must first make sure to have activated MyChart and know their login information - If Apple, go to [employer] and type in MyChart in the  search bar and download the app. If Android, ask patient to go to [employer] and type in [place] in the search bar and download the app. The app is free but as with any other app downloads, their phone may require them to verify saved payment information or Apple/Android password.  - The patient will need to then log into the app with their MyChart username and password, and select [facility] as their healthcare provider to link the account. When it is time for your visit, go to the MyChart  app, find appointments, and click Begin Video Visit. Be sure to Select Allow for your device to access the Microphone and Camera for your visit. You will then be connected, and your provider will be with you shortly.  **If they have any issues connecting, or need assistance please contact MyChart service desk [phone])**  **If using a computer, in order to ensure the best quality for their visit they will need to use either of the following Internet Browsers: [employer], or Google Chrome**  IF USING DOXIMITY or DOXY.ME - The patient will receive a link just prior to their visit, either by text or email (to be determined day of appointment depending on if it's doxy.me or Doximity).     FULL LENGTH CONSENT FOR TELE-HEALTH VISIT   I hereby voluntarily request, consent and authorize [facility] and its employed or contracted physicians, physician assistants, nurse practitioners or other licensed health care professionals (the Practitioner), to provide me with telemedicine health care services (the Services") as deemed necessary by the treating Practitioner. I acknowledge and consent to receive the Services by the Practitioner via telemedicine. I understand that the telemedicine visit will involve communicating with the Practitioner through live audiovisual communication technology and the disclosure of certain medical information by electronic transmission. I acknowledge that I have been given the opportunity to request an in-person assessment or other available alternative prior to the telemedicine visit and am voluntarily participating in the telemedicine visit.  I understand that I have the right to withhold or withdraw my consent to the use of telemedicine in the course of my care at any time, without affecting my right to future care or treatment, and that the Practitioner or I may terminate the telemedicine visit at any time. I understand that I have the right to inspect all  information obtained and/or recorded in the course of the telemedicine visit and may receive copies of available information for a reasonable fee.  I understand that some of the potential risks of receiving the Services via telemedicine include:   Delay or interruption in medical evaluation due to technological equipment failure or disruption;  Information transmitted may not be sufficient (e.g. poor resolution of images) to allow for appropriate medical decision making by the Practitioner; and/or   In rare instances, security protocols could fail, causing a breach of personal health information.  Furthermore, I acknowledge that it is my responsibility to provide information about my medical history, conditions and care that is complete and accurate to the best of my ability. I acknowledge that Practitioner's advice, recommendations, and/or decision may be based on factors not within their control, such as incomplete or inaccurate data provided by me or distortions of diagnostic images or specimens that may result from electronic transmissions. I understand that the practice of medicine is not an exact science and that Practitioner makes no warranties or guarantees regarding treatment outcomes. I acknowledge that I will receive a copy of this consent  concurrently upon execution via email to the email address I last provided but may also request a printed copy by calling the office of [facility].    I understand that my insurance will be billed for this visit.   I have read or had this consent read to me.  I understand the contents of this consent, which adequately explains the benefits and risks of the Services being provided via telemedicine.   I have been provided ample opportunity to ask questions regarding this consent and the Services and have had my questions answered to my satisfaction.  I give my informed consent for the services to be provided through the use of telemedicine in my  medical care  By participating in this telemedicine visit I agree to the above.

## 2019-01-17 ENCOUNTER — Telehealth: Payer: Self-pay

## 2019-01-17 NOTE — Telephone Encounter (Signed)
Patient called back.  Reviewed with patient how his video visit would go.  Preformed a "Doxy Trial" - Patient never connected.  Will try again later.

## 2019-01-17 NOTE — Telephone Encounter (Signed)
lmov for patient to call back.  Looks like at the time of the appointment being scheduled patient was unsure if he was going to be able to do a video visit and would like to try it out.  Was reaching out to patient to do a "doxy trial"

## 2019-01-17 NOTE — Telephone Encounter (Signed)
Patient called back.  Patient received the link but did not know what to do with it from here.  Explained to him that all he will need to do is click on the link and enter his first and last name.  He stated that he would rather just do a telephone visit due to him not know how to work his phone.

## 2019-01-19 ENCOUNTER — Other Ambulatory Visit: Payer: Self-pay

## 2019-01-19 ENCOUNTER — Telehealth (INDEPENDENT_AMBULATORY_CARE_PROVIDER_SITE_OTHER): Payer: Medicare Other | Admitting: Cardiovascular Disease

## 2019-01-19 DIAGNOSIS — I10 Essential (primary) hypertension: Secondary | ICD-10-CM

## 2019-01-19 DIAGNOSIS — J449 Chronic obstructive pulmonary disease, unspecified: Secondary | ICD-10-CM

## 2019-01-19 DIAGNOSIS — I700 Atherosclerosis of aorta: Secondary | ICD-10-CM

## 2019-01-19 DIAGNOSIS — E782 Mixed hyperlipidemia: Secondary | ICD-10-CM | POA: Diagnosis not present

## 2019-01-19 DIAGNOSIS — Z720 Tobacco use: Secondary | ICD-10-CM

## 2019-01-19 DIAGNOSIS — I350 Nonrheumatic aortic (valve) stenosis: Secondary | ICD-10-CM | POA: Insufficient documentation

## 2019-01-19 DIAGNOSIS — I5032 Chronic diastolic (congestive) heart failure: Secondary | ICD-10-CM

## 2019-01-19 NOTE — Progress Notes (Signed)
Virtual Visit via Telephone Note   This visit type was conducted due to national recommendations for restrictions regarding the COVID-19 Pandemic (e.g. social distancing) in an effort to limit this patient's exposure and mitigate transmission in our community.  Due to his co-morbid illnesses, this patient is at least at moderate risk for complications without adequate follow up.  This format is felt to be most appropriate for this patient at this time.  The patient did not have access to video technology/had technical difficulties with video requiring transitioning to audio format only (telephone).  All issues noted in this document were discussed and addressed.  No physical exam could be performed with this format.  Please refer to the patient's chart for his  consent to telehealth for [facility].   I connected with  Nathaniel Little on 01/19/19 by a video enabled telemedicine application and verified that I am speaking with the correct person using two identifiers. I discussed the limitations of evaluation and management by telemedicine. The patient expressed understanding and agreed to proceed.   Evaluation Performed:  Follow-up visit  Date:  01/19/2019   ID:  Nathaniel Little, DOB [birth year], MRN [id number]  Patient Location:  [address], [place] [address]   Provider location:   [facility], [place] office  PCP:  Nathaniel College, NP  Cardiologist:  [place], [place]   Chief Complaint:  SOB    History of Present Illness:    Nathaniel Little is a 73 y.o. male who presents via audio/video conferencing for a telehealth visit today.   The patient does not symptoms concerning for COVID-19 infection (fever, chills, cough, or new SHORTNESS OF BREATH).   Patient has a past medical history of Smoker, COPD Prior significant alcohol intake.  OSA, on CPAP HTN Murmur Prostate cancer Diabetes: 07/25/2017 emergency department via EMS with 1 day of  progressive worsening shortness of breath.   increasing productive cough and shortness of breath  Constipated CT ABD: Pancreatic findings :chronic pancreatitis and calcifications, possible pancreatic mass Moderate aortic atherosclerosis noted extending into the common iliac vessels Who presents for follow up of his aortic valve stenosis  Overall feels well  Echo, reviewed with him on tadays discussion - Left ventricle: The cavity size was normal. There was moderate concentric hypertrophy. Systolic function was vigorous. The estimated ejection fraction was in the range of 65% to 70%. Wall motion was normal; there were no regional wall motion abnormalities. Doppler parameters are consistent with abnormal left ventricular relaxation (grade 1 diastolic dysfunction). - Aortic valve: There was moderate stenosis. Mean gradient (S): 28 mm Hg. Valve area (VTI): 1.15 cm^2. - Stable moderate aortic stenosis.   SOB, in inhalers Smokes 2 cigs a day  Denies having significant chest pain  Chronic leg pain,  sometimes when he sleeps, sometimes in the day.    Tries to walk, to the bank "I can walk 18 blocks"   Prior CV studies:   The following studies were reviewed today:  Echo 08/23/2017 EF 60% Aortic valve:  moderate stenosis,  Mean gradient (S): 37m Hg.  Peak gradient (S): 53 mm Hg.peak velocity 363 cm/s  Stress test from 08/2017 Pharmacological myocardial perfusion imaging study with no significant ischemia Normal wall motion, EF estimated at 53% No EKG changes concerning for ischemia at peak stress or in recovery. Low risk scan  Past Medical History:  Diagnosis Date   (HFpEF) heart failure with preserved ejection fraction ([place]antt  a. 08/2017 Echo: EF 60-65%, Gr1 DD.   Arthritis    Chest pain    a. 08/2017 Lexiscan MV: EF 53%, no ischemia-->Low risk.   COPD (chronic obstructive pulmonary disease) (HCC)    Depression    Diabetes mellitus without complication (HCC)     Dyspnea    Heart murmur    History of orthopnea    Hypertension    Lymphedema    Moderate aortic stenosis    a. 08/2017 Echo: EF 60-65%, no rwma, GR1 DD, Mod Ao Stenosis, mean grad 38mg. Valve area (VTI): 1.04 cm^2. Mildly dil LA.   Morbid obesity ([place]    Prostate cancer ([place] 03/2013   ? PANCREATIC   Sleep apnea    Sleep apnea with use of continuous positive airway pressure (CPAP)    Tobacco abuse    Tremor    HEAD   Wheezing    OCCAS   Past Surgical History:  Procedure Laterality Date   ADENOIDECTOMY     APPENDECTOMY     CATARACT EXTRACTION W/PHACO Left 11/28/2018   Procedure: CATARACT EXTRACTION PHACO AND INTRAOCULAR LENS PLACEMENT (IOC) LEFT, DIABETIC;  Surgeon: PBirder Robson MD;  Location: [facility] ORS;  Service: Ophthalmology;  Laterality: Left;  [place] 00:56 CDE 9.44 Fluid pack lot # [id number]   EUS N/A 10/13/2017   Procedure: FULL UPPER ENDOSCOPIC ULTRASOUND (EUS) RADIAL;  Surgeon: [name];  [facility];  Service: Gastroenterology;  Laterality: N/A;   PANCREATICODUODENECTOMY  07/23/2018   TONSILLECTOMY       Current Meds  Medication Sig   albuterol (PROVENTIL HFA;VENTOLIN HFA) 108 (90 Base) MCG/ACT inhaler Inhale 2 puffs into the lungs every 6 (six) hours as needed for wheezing or shortness of breath.   albuterol (PROVENTIL) (2.5 MG/3ML) 0.083% nebulizer solution Take 2.5 mg by nebulization every 6 (six) hours as needed for wheezing or shortness of breath.   aspirin EC 81 MG tablet Take 81 mg by mouth daily.   blood glucose meter kit and supplies Dispense based on patient and insurance preference. Use up to four times daily as directed. (FOR ICD-10 E10.9, E11.9).   budesonide-formoterol (SYMBICORT) 80-4.5 MCG/ACT inhaler Inhale 2 puffs into the lungs 2 (two) times daily.   cloNIDine (CATAPRES) 0.1 MG tablet Take 1 tablet (0.1 mg total) by mouth 2 (two) times daily.   clopidogrel (PLAVIX) 75 MG tablet Take 75 mg by  mouth daily.    docusate sodium (COLACE) 100 MG capsule Take 100 mg by mouth daily as needed for mild constipation.   hydrochlorothiazide (HYDRODIURIL) 12.5 MG tablet Take 1 tablet (12.5 mg total) by mouth daily.   ipratropium-albuterol (DUONEB) 0.5-2.5 (3) MG/3ML SOLN Take 3 mLs by nebulization every 6 (six) hours as needed (wheezing, shortness of breath).   metFORMIN (GLUCOPHAGE) 500 MG tablet Take 1,000 mg by mouth 2 (two) times daily.    ondansetron (ZOFRAN) 4 MG tablet Take 4 mg by mouth every 8 (eight) hours as needed for nausea or vomiting.   pantoprazole (PROTONIX) 40 MG tablet Take 1 tablet (40 mg total) by mouth daily.   simethicone (GAS-X) 80 MG chewable tablet Chew 1 tablet (80 mg total) by mouth every 6 (six) hours as needed for flatulence.   tiotropium (SPIRIVA HANDIHALER) 18 MCG inhalation capsule Place 1 capsule (18 mcg total) into inhaler and inhale daily. (Patient taking differently: Place 18 mcg into inhaler and inhale 2 (two) times daily as needed (for respiratory issues.). )   traZODone (DESYREL) 100 MG tablet  Take 1 tablet (100 mg total) by mouth at bedtime as needed for sleep. (Patient taking differently: Take 100 mg by mouth at bedtime. )     Allergies:   Codeine   Social History   Tobacco Use   Smoking status: Current Some Day Smoker    Packs/day: 0.25    Years: 68.00    Pack years: 17.00    Types: Cigarettes   Smokeless tobacco: Former Systems developer    Types: Chew, Snuff    Quit date: 05/16/2016   Tobacco comment: 2 cig, day  Substance Use Topics   Alcohol use: Not Currently   Drug use: No     Current Outpatient Medications on File Prior to Visit  Medication Sig Dispense Refill   albuterol (PROVENTIL HFA;VENTOLIN HFA) 108 (90 Base) MCG/ACT inhaler Inhale 2 puffs into the lungs every 6 (six) hours as needed for wheezing or shortness of breath. 1 Inhaler 0   albuterol (PROVENTIL) (2.5 MG/3ML) 0.083% nebulizer solution Take 2.5 mg by nebulization every 6  (six) hours as needed for wheezing or shortness of breath.     aspirin EC 81 MG tablet Take 81 mg by mouth daily.     blood glucose meter kit and supplies Dispense based on patient and insurance preference. Use up to four times daily as directed. (FOR ICD-10 E10.9, E11.9). 1 each 0   budesonide-formoterol (SYMBICORT) 80-4.5 MCG/ACT inhaler Inhale 2 puffs into the lungs 2 (two) times daily. 1 Inhaler 2   cloNIDine (CATAPRES) 0.1 MG tablet Take 1 tablet (0.1 mg total) by mouth 2 (two) times daily. 180 tablet 1   clopidogrel (PLAVIX) 75 MG tablet Take 75 mg by mouth daily.      docusate sodium (COLACE) 100 MG capsule Take 100 mg by mouth daily as needed for mild constipation.     hydrochlorothiazide (HYDRODIURIL) 12.5 MG tablet Take 1 tablet (12.5 mg total) by mouth daily. 90 tablet 1   ipratropium-albuterol (DUONEB) 0.5-2.5 (3) MG/3ML SOLN Take 3 mLs by nebulization every 6 (six) hours as needed (wheezing, shortness of breath). 360 mL 3   metFORMIN (GLUCOPHAGE) 500 MG tablet Take 1,000 mg by mouth 2 (two) times daily.      ondansetron (ZOFRAN) 4 MG tablet Take 4 mg by mouth every 8 (eight) hours as needed for nausea or vomiting.     pantoprazole (PROTONIX) 40 MG tablet Take 1 tablet (40 mg total) by mouth daily. 30 tablet 11   simethicone (GAS-X) 80 MG chewable tablet Chew 1 tablet (80 mg total) by mouth every 6 (six) hours as needed for flatulence. 30 tablet 5   tiotropium (SPIRIVA HANDIHALER) 18 MCG inhalation capsule Place 1 capsule (18 mcg total) into inhaler and inhale daily. (Patient taking differently: Place 18 mcg into inhaler and inhale 2 (two) times daily as needed (for respiratory issues.). ) 30 capsule 5   traZODone (DESYREL) 100 MG tablet Take 1 tablet (100 mg total) by mouth at bedtime as needed for sleep. (Patient taking differently: Take 100 mg by mouth at bedtime. ) 90 tablet 1   No current facility-administered medications on file prior to visit.      Family Hx: The  patient's family history includes Breast cancer in his mother; Cancer in his father; Cerebral palsy in his brother; Tuberculosis in his paternal uncle. There is no history of Prostate cancer, Kidney cancer, or Bladder Cancer.  ROS:   Please see the history of present illness.    Review of Systems  Constitutional: Negative.  Respiratory: Positive for shortness of breath.   Cardiovascular: Negative.   Gastrointestinal: Negative.   Musculoskeletal: Negative.   Neurological: Negative.   Psychiatric/Behavioral: Negative.   All other systems reviewed and are negative.     Labs/Other Tests and Data Reviewed:    Recent Labs: 09/26/2018: ALT 39; BUN 18; Creatinine, Ser 1.06; Hemoglobin 11.0; Platelets 353; Potassium 3.4; Sodium 136   Recent Lipid Panel Lab Results  Component Value Date/Time   CHOL 178 05/01/2018 08:56 AM   TRIG 143 05/01/2018 08:56 AM   HDL 43 05/01/2018 08:56 AM   CHOLHDL 4.1 05/01/2018 08:56 AM   LDLCALC 109 (H) 05/01/2018 08:56 AM    Wt Readings from Last 3 Encounters:  11/09/18 246 lb (111.6 kg)  11/23/18 240 lb 3.2 oz (109 kg)  11/20/18 242 lb 12.8 oz (110.1 kg)     Exam:    Vital Signs: Vital signs may also be detailed in the HPI There were no vitals taken for this visit.  Wt Readings from Last 3 Encounters:  11/09/18 246 lb (111.6 kg)  11/23/18 240 lb 3.2 oz (109 kg)  11/20/18 242 lb 12.8 oz (110.1 kg)   Temp Readings from Last 3 Encounters:  11/28/18 97.6 F (36.4 C) (Oral)  11/20/18 98 F (36.7 C) (Oral)  09/26/18 97.9 F (36.6 C) (Oral)   BP Readings from Last 3 Encounters:  11/28/18 124/65  11/20/18 (!) 126/58  10/11/18 110/74   Pulse Readings from Last 3 Encounters:  11/28/18 84  11/20/18 91  09/27/18 70    152/80 (stress running it high) Pulse: 79 resp 16  Well nourished, well developed male in no acute distress. Constitutional:  oriented to person, place, and time. No distress.      ASSESSMENT & PLAN:    Aortic  atherosclerosis (HCC) Diffuse disease, Needs a statin, smoking cessation   Moderate aortic stenosis Moderate stenosis, repeat echo in 1 -2 years  Chronic heart failure with preserved ejection fraction (HCC) On HCTZ , euvolemic getting some cramping (walking too much some days)  Essential hypertension High today, better on last several checks Will monitor for now  Tobacco abuse Still smoking a few cigs a day We have encouraged him to continue to work on weaning his cigarettes and smoking cessation. He will continue to work on this and does not want any assistance with chantix.   Chronic obstructive pulmonary disease, unspecified COPD type ([place]) Baseline SOB,on inhalers Needs to quit smoking  Mixed hyperlipidemia Needs a statin, wants    COVID-19 Education: The signs and symptoms of COVID-19 were discussed with the patient and how to seek care for testing (follow up with PCP or arrange E-visit).  The importance of social distancing was discussed today.  Patient Risk:   After full review of this patients clinical status, I feel that they are at least moderate risk at this time.  Time:   Today, I have spent 25 minutes with the patient with telehealth technology discussing the cardiac and medical problems/diagnoses detailed above   10 min spent reviewing the chart prior to patient visit today   Medication Adjustments/Labs and Tests Ordered: Current medicines are reviewed at length with the patient today.  Concerns regarding medicines are outlined above.   Tests Ordered: No tests ordered   Medication Changes: No changes made   Disposition: Follow-up in 12 months   Signed, Ida Rogue, MD  01/19/2019 12:06 PM    [place] [id number],  [place], [place] [address]

## 2019-01-19 NOTE — Patient Instructions (Addendum)

## 2019-01-20 ENCOUNTER — Other Ambulatory Visit: Payer: Self-pay | Admitting: Nurse Practitioner

## 2019-01-20 DIAGNOSIS — F5104 Psychophysiologic insomnia: Secondary | ICD-10-CM

## 2019-01-20 DIAGNOSIS — G4709 Other insomnia: Secondary | ICD-10-CM

## 2019-01-20 DIAGNOSIS — F4310 Post-traumatic stress disorder, unspecified: Secondary | ICD-10-CM

## 2019-01-22 MED ORDER — TRAZODONE HCL 100 MG PO TABS: 100.0000 mg | ORAL_TABLET | Freq: Every evening | ORAL | 0 refills | Status: DC | PRN

## 2019-01-25 ENCOUNTER — Other Ambulatory Visit: Payer: Self-pay

## 2019-01-25 ENCOUNTER — Encounter: Payer: Medicare Other | Attending: Surgery | Admitting: Dietician

## 2019-01-25 ENCOUNTER — Encounter: Payer: Self-pay | Admitting: Dietician

## 2019-01-25 VITALS — BP 140/72 | Ht 69.0 in | Wt 231.5 lb

## 2019-01-25 DIAGNOSIS — E119 Type 2 diabetes mellitus without complications: Secondary | ICD-10-CM | POA: Diagnosis not present

## 2019-01-25 DIAGNOSIS — Z713 Dietary counseling and surveillance: Secondary | ICD-10-CM | POA: Insufficient documentation

## 2019-01-25 DIAGNOSIS — Z794 Long term (current) use of insulin: Secondary | ICD-10-CM | POA: Insufficient documentation

## 2019-01-25 NOTE — Progress Notes (Signed)

## 2019-01-26 ENCOUNTER — Encounter: Payer: Self-pay | Admitting: *Deleted

## 2019-01-29 DIAGNOSIS — H3500 Unspecified background retinopathy: Secondary | ICD-10-CM | POA: Diagnosis not present

## 2019-01-29 DIAGNOSIS — E119 Type 2 diabetes mellitus without complications: Secondary | ICD-10-CM | POA: Diagnosis not present

## 2019-01-29 DIAGNOSIS — E1159 Type 2 diabetes mellitus with other circulatory complications: Secondary | ICD-10-CM | POA: Diagnosis not present

## 2019-02-20 ENCOUNTER — Telehealth: Payer: Self-pay | Admitting: *Deleted

## 2019-02-20 NOTE — Telephone Encounter (Signed)
@  [facility]   Outreach Note  02/20/2019 Name: Nathaniel Little MRN: [id number] DOB: [birth year]  Referred by: [name], NP Reason for referral : Chronic Care Management (Initial CCM outreach call was unsuccessful. )   An unsuccessful telephone outreach was attempted today. The patient was referred to the case management team by for assistance with chronic care management and care coordination.   Follow Up Plan: A HIPPA compliant phone message was left for the patient providing contact information and requesting a return call.  The CM team will reach out to the patient again over the next 7 days.   [place]  [email or web address]   ??[phone]

## 2019-02-27 ENCOUNTER — Ambulatory Visit: Payer: Self-pay | Admitting: Nurse Practitioner

## 2019-02-27 NOTE — Progress Notes (Signed)
°  Chronic Care Management   Outreach Note  02/27/2019 Name: Nathaniel Little MRN: [id number] DOB: June 02, 1946  Referred by: [name], NP Reason for referral : No chief complaint on file.   Third unsuccessful telephone outreach was attempted today. The patient was referred to the case management team for assistance with chronic care management and care coordination. The patient's primary care provider has been notified of our unsuccessful attempts to make or maintain contact with the patient. The care management team is pleased to engage with this patient at any time in the future should he/she be interested in assistance from the care management team.   Follow Up Plan: The care management team is available to follow up with the patient after provider conversation with the patient regarding recommendation for care management engagement and subsequent re-referral to the care management team.   [place]  [email or web address]   ??[phone]

## 2019-02-27 NOTE — Chronic Care Management (AMB) (Signed)
°  Chronic Care Management   Outreach Note  02/27/2019 Name: Nathaniel Little MRN: [id number] DOB: 29-Jan-1946  Referred by: [name], NP Reason for referral : No chief complaint on file.   Third unsuccessful telephone outreach was attempted today. The patient was referred to the case management team for assistance with chronic care management and care coordination. The patient's primary care provider has been notified of our unsuccessful attempts to make or maintain contact with the patient. The care management team is pleased to engage with this patient at any time in the future should he/she be interested in assistance from the care management team.   Follow Up Plan: A HIPPA compliant phone message was left for the patient providing contact information and requesting a return call.  The care management team is available to follow up with the patient after provider conversation with the patient regarding recommendation for care management engagement and subsequent re-referral to the care management team.   [place]  [email or web address]   ??[phone]

## 2019-03-05 ENCOUNTER — Other Ambulatory Visit: Payer: Self-pay

## 2019-03-05 ENCOUNTER — Ambulatory Visit (INDEPENDENT_AMBULATORY_CARE_PROVIDER_SITE_OTHER): Payer: Medicare Other | Admitting: Family Medicine

## 2019-03-05 ENCOUNTER — Ambulatory Visit: Payer: Medicare Other | Admitting: Nurse Practitioner

## 2019-03-05 DIAGNOSIS — K5900 Constipation, unspecified: Secondary | ICD-10-CM | POA: Diagnosis not present

## 2019-03-05 DIAGNOSIS — R252 Cramp and spasm: Secondary | ICD-10-CM

## 2019-03-05 DIAGNOSIS — M1610 Unilateral primary osteoarthritis, unspecified hip: Secondary | ICD-10-CM

## 2019-03-05 NOTE — Patient Instructions (Addendum)
AVS given on phone. No mychart access.

## 2019-03-05 NOTE — Progress Notes (Addendum)
Virtual Visit via Telephone The purpose of this virtual visit is to provide medical care while limiting exposure to the novel coronavirus (COVID19) for both patient and office staff.  Consent was obtained for phone visit:  Yes.   Answered questions that patient had about telehealth interaction:  Yes.   I discussed the limitations, risks, security and privacy concerns of performing an evaluation and management service by telephone. I also discussed with the patient that there may be a patient responsible charge related to this service. The patient expressed understanding and agreed to proceed.  Patient Location: Home Provider Location: [facility] (Office)  PCP is Cassell Smiles, AGPCNP-BC - I am currently covering during her maternity leave.   ---------------------------------------------------------------------- Chief Complaint  Patient presents with  . Osteoarthritis  . Constipation    S: Reviewed CMA documentation. I have called patient and gathered additional HPI as follows:  Muscle cramping, nocturnal Reports nocturnal cramps, occasionally in upper and lower extremity muscles, he says wakes him up, with muscle pain at times and spasm. He drinks mostly water distilled. Asking about treatment and remedy. He also adds [place] Lemonade sugar free mix to water often.  Constipation, chronic - Chronic problem with constipation, having to strain more often, he takes Colace 117m daily as needed, with limited relief. Not taking miralax.  Pain in Right Hip / History of osteoarthritis multiple joints Reports chronic issue Right hip pain, worse with aching episodic joint pain, worse with weather changes. - Not taking Tylenol. He does take ASA 836mdaily - No X-rays on file.  Denies any high risk travel to areas of current concern for COVID19. Denies any known or suspected exposure to person with or possibly with COVID19.  Denies any fevers, chills, sweats, body ache,  cough, shortness of breath, sinus pain or pressure, headache, abdominal pain, diarrhea  Past Medical History:  Diagnosis Date  . (HFpEF) heart failure with preserved ejection fraction (HCWest [place]   a. 08/2017 Echo: EF 60-65%, Gr1 DD.  . Marland Kitchenrthritis   . Chest pain    a. 08/2017 Lexiscan MV: EF 53%, no ischemia-->Low risk.  . Mar[place] KitchenOPD (chronic obstructive pulmonary disease) (HCLong[place]  . Depression   . Diabetes mellitus without complication (HCEmlyn  . Dyspnea   . Heart murmur   . History of orthopnea   . Hypertension   . Lymphedema   . Moderate aortic stenosis    a. 08/2017 Echo: EF 60-65%, no rwma, GR1 DD, Mod Ao Stenosis, mean grad 2672m Valve area (VTI): 1.04 cm^2. Mildly dil LA.  . Morbid obesity (HCCO[place] . Prostate cancer (HCCWoodlawn6/2014   ? PANCREATIC  . Sleep apnea   . Sleep apnea with use of continuous positive airway pressure (CPAP)   . Tobacco abuse   . Tremor    HEAD  . Wheezing    OCCAS   Social History   Tobacco Use  . Smoking status: Former Smoker    Years: 68.00    Types: Cigarettes  . Smokeless tobacco: Former UseSystems developer Types: Chew, Snuff    Quit date: 05/16/2016  Substance Use Topics  . Alcohol use: Yes    Alcohol/week: 0.0 - 1.0 standard drinks    Comment: occasional  . Drug use: No    Current Outpatient Medications:  .  albuterol (PROVENTIL HFA;VENTOLIN HFA) 108 (90 Base) MCG/ACT inhaler, Inhale 2 puffs into the lungs every 6 (six) hours as needed for wheezing or shortness of breath., Disp: 1  Inhaler, Rfl: 0 .  albuterol (PROVENTIL) (2.5 MG/3ML) 0.083% nebulizer solution, Take 2.5 mg by nebulization every 6 (six) hours as needed for wheezing or shortness of breath., Disp: , Rfl:  .  aspirin EC 81 MG tablet, Take 81 mg by mouth daily., Disp: , Rfl:  .  blood glucose meter kit and supplies, Dispense based on patient and insurance preference. Use up to four times daily as directed. (FOR ICD-10 E10.9, E11.9)., Disp: 1 each, Rfl: 0 .  budesonide-formoterol (SYMBICORT) 80-4.5  MCG/ACT inhaler, Inhale 2 puffs into the lungs 2 (two) times daily., Disp: 1 Inhaler, Rfl: 2 .  cloNIDine (CATAPRES) 0.1 MG tablet, Take 1 tablet (0.1 mg total) by mouth 2 (two) times daily., Disp: 180 tablet, Rfl: 1 .  clopidogrel (PLAVIX) 75 MG tablet, Take 75 mg by mouth daily. , Disp: , Rfl:  .  gabapentin (NEURONTIN) 100 MG capsule, Take 1 capsule at night before bed. May increase to 2 capsules after one week if needed, Disp: , Rfl:  .  hydrochlorothiazide (HYDRODIURIL) 12.5 MG tablet, Take 1 tablet (12.5 mg total) by mouth daily., Disp: 90 tablet, Rfl: 1 .  ipratropium-albuterol (DUONEB) 0.5-2.5 (3) MG/3ML SOLN, Take 3 mLs by nebulization every 6 (six) hours as needed (wheezing, shortness of breath)., Disp: 360 mL, Rfl: 3 .  metFORMIN (GLUCOPHAGE) 500 MG tablet, Take 1,000 mg by mouth 2 (two) times daily. , Disp: , Rfl:  .  pantoprazole (PROTONIX) 40 MG tablet, Take 1 tablet (40 mg total) by mouth daily., Disp: 30 tablet, Rfl: 11 .  tiotropium (SPIRIVA HANDIHALER) 18 MCG inhalation capsule, Place 1 capsule (18 mcg total) into inhaler and inhale daily. (Patient taking differently: Place 18 mcg into inhaler and inhale 2 (two) times daily as needed (for respiratory issues.). ), Disp: 30 capsule, Rfl: 5 .  traZODone (DESYREL) 100 MG tablet, Take 1 tablet (100 mg total) by mouth at bedtime as needed for sleep., Disp: 90 tablet, Rfl: 0 .  docusate sodium (COLACE) 100 MG capsule, Take 100 mg by mouth daily as needed for mild constipation., Disp: , Rfl:  .  ondansetron (ZOFRAN) 4 MG tablet, Take 4 mg by mouth every 8 (eight) hours as needed for nausea or vomiting., Disp: , Rfl:  .  simethicone (GAS-X) 80 MG chewable tablet, Chew 1 tablet (80 mg total) by mouth every 6 (six) hours as needed for flatulence. (Patient not taking: Reported on 03/05/2019), Disp: 30 tablet, Rfl: 5  Depression screen [facility] 2/9 03/05/2019 10/11/2018 02/28/2018  Decreased Interest 1 1 0  Down, Depressed, Hopeless 1 0 1  PHQ - 2 Score _0 Altered sleeping 0 - 3  Tired, decreased energy 1 - 1  Change in appetite 3 - 0  Feeling bad or failure about yourself  0 - 0  Trouble concentrating 0 - 0  Moving slowly or fidgety/restless 1 - 0  Suicidal thoughts 0 - 0  PHQ-9 Score 7 - 5  Difficult doing work/chores Not difficult at all - Somewhat difficult    No flowsheet data found.  -------------------------------------------------------------------------- O: No physical exam performed due to remote telephone encounter.  Lab results reviewed.  No results found for this or any previous visit (from the past 2160 hour(s)).  -------------------------------------------------------------------------- A&P:  Problem List Items Addressed This Visit    Arthritis pain of hip R Hip, osteoarthritis  Clinically consistent based on history provided Known other joint OA/DJD No x-rays on file Inadequate therapy currently  Recommend Tylenol Ext Str  1079m TID regularly vs PRN Avoid oral NSAID now May continue his existing ASA 837mdaily Future consider muscle relaxant Follow-up w/ PCP consider Hip X-ray, trochanteric bursa injection vs PT / Ortho     Other Visit Diagnoses    Muscle cramping    -  Primary Clinically with typical muscle nocturnal cramping lower extremity, seems occasionally more severe  Trial on home remedy mustard PRN cramps May use existing supplement to add to drinks to get electrolytes Can try OTC Hyland's Leg Cramps, natural remedy Future may warrant muscle relaxant if indicated    Constipation, unspecified constipation type  Clinically with episodic constipation, without complication Does admit to straining and rare amount of blood mixed in with straining  Trial on Miralax 17g daily then PRN use titrate to goal amount of soft BMs           #Tremors Additional concern reported with some tremors episodic Uncertain etiology, advised may be linked to history of alcohol use, or neurological  cause Advised to follow-up in person at next visit to discuss this problem, may warrant referral  No orders of the defined types were placed in this encounter.   Follow-up: - Return in 2-3 months for PCP for R Hip Pain / Diabetes / HTN / Constipation  Patient verbalizes understanding with the above medical recommendations including the limitation of remote medical advice.  Specific follow-up and call-back criteria were given for patient to follow-up or seek medical care more urgently if needed.   - Time spent in direct consultation with patient on phone: 12 minutes  AlNobie PutnamDOMillervilleroup 03/05/2019, 2:03 PM

## 2019-03-23 ENCOUNTER — Telehealth: Payer: Self-pay

## 2019-03-23 ENCOUNTER — Other Ambulatory Visit: Payer: Self-pay | Admitting: Nurse Practitioner

## 2019-03-23 DIAGNOSIS — I739 Peripheral vascular disease, unspecified: Secondary | ICD-10-CM

## 2019-03-23 DIAGNOSIS — I10 Essential (primary) hypertension: Secondary | ICD-10-CM

## 2019-03-23 DIAGNOSIS — I700 Atherosclerosis of aorta: Secondary | ICD-10-CM

## 2019-03-23 MED ORDER — CLOPIDOGREL BISULFATE 75 MG PO TABS: 75.0000 mg | ORAL_TABLET | Freq: Every day | ORAL | 2 refills | Status: DC

## 2019-03-23 NOTE — Telephone Encounter (Signed)
Previously from historical provider. He is on medication however and followed by Cardiology.  Will refill  Nobie Putnam, DO [facility] 03/23/2019, 12:57 PM

## 2019-03-23 NOTE — Telephone Encounter (Signed)
[facility] sent a refill request for:  Clopidogrel 75mg  1 daily.  Last fill was on 02/06/19.  I do not see where we have ever prescribed rx.  Please advise

## 2019-06-05 ENCOUNTER — Other Ambulatory Visit: Payer: Self-pay

## 2019-06-05 ENCOUNTER — Ambulatory Visit (INDEPENDENT_AMBULATORY_CARE_PROVIDER_SITE_OTHER): Payer: Medicare Other | Admitting: Nurse Practitioner

## 2019-06-05 ENCOUNTER — Encounter: Payer: Self-pay | Admitting: Nurse Practitioner

## 2019-06-05 VITALS — BP 117/60 | HR 82 | Ht 69.0 in | Wt 225.0 lb

## 2019-06-05 DIAGNOSIS — I700 Atherosclerosis of aorta: Secondary | ICD-10-CM

## 2019-06-05 DIAGNOSIS — F5221 Male erectile disorder: Secondary | ICD-10-CM

## 2019-06-05 DIAGNOSIS — M25551 Pain in right hip: Secondary | ICD-10-CM | POA: Diagnosis not present

## 2019-06-05 DIAGNOSIS — J410 Simple chronic bronchitis: Secondary | ICD-10-CM

## 2019-06-05 DIAGNOSIS — N529 Male erectile dysfunction, unspecified: Secondary | ICD-10-CM

## 2019-06-05 DIAGNOSIS — R011 Cardiac murmur, unspecified: Secondary | ICD-10-CM

## 2019-06-05 DIAGNOSIS — I10 Essential (primary) hypertension: Secondary | ICD-10-CM | POA: Diagnosis not present

## 2019-06-05 MED ORDER — DICLOFENAC SODIUM 75 MG PO TBEC: 75.0000 mg | DELAYED_RELEASE_TABLET | Freq: Two times a day (BID) | ORAL | 0 refills | Status: DC

## 2019-06-05 NOTE — Progress Notes (Signed)
Subjective:    Patient ID: Nathaniel Little, male    DOB: 08-24-1946, 73 y.o.   MRN: [id number]  Nathaniel Little is a 73 y.o. male presenting on 06/05/2019 for Hypertension and Diabetes (the pt state his diarrhea improved)  HPI Heart Murmur Atherosclerosis Patient reports no interval ASCVD events, but pt notes sharp chest pain 4 days ago that resolved in about 5 minutes. - Pt denies changes in vision, palpitations, shortness of breath, leg pain while walking, leg or arm weakness, and sudden loss of speech or loss of consciousness.   Hypertension - He is not checking BP at home or outside of clinic.    - Current medications: hydrochlorothiazide 12.5 mg once daily, tolerating well without side effects - He is not currently symptomatic. - Pt denies headache, lightheadedness, dizziness, changes in vision, chest tightness/pressure, palpitations, leg swelling, sudden loss of speech or loss of consciousness. - He  reports an exercise routine that includes walking, 3-4 days per week.  Patient walks frequently on payday - 22 blocks. - His diet is high in salt, moderate in fat, and high in carbohydrates.   RIGHT Hip Pain Pain is worsening.  Previously discussed.  Now has more pain with walking.  Patient has no pain with sitting for prolonged.  Patient stops and rests due to pain. - Patient used arthritis heat rub with some relief. - Pain is present from hip to knee along lateral, anterior leg - Most days, pain level is 5 usually but increases to 9/10 with activity. - Patient admits some left knee pain occasionally - Patient is not taking any medications for pain other than muscle rub currently. - Last Xray was about 3 years ago in [place].  Diabetes - managed by Maximino Sarin - Patient is watching what he is eating.  Decreasing steadily.  Bakes everything.  Fruit instead of sweets.  Sugar free drinks.     COPD Uses albuterol inhaler only 2 times per week.  Is currently taking  maintenance inhaler Spiriva, Symbicort.  Currently denies any significant shortness of breath that does not resolve with rest.  Activity significantly hindered by hip pain.  Erectile Dysfunction Patient is possibly in need of urology referral for inadequate erection abilities.  Patient has had prostate cancer treatment and states he is unable currently to participate in sexual activity due to only having partial erection.  Social History   Tobacco Use  . Smoking status: Former Smoker    Years: 68.00    Types: Cigarettes  . Smokeless tobacco: Former Systems developer    Types: Chew, Snuff    Quit date: 05/16/2016  Substance Use Topics  . Alcohol use: Yes    Alcohol/week: 0.0 - 1.0 standard drinks    Comment: occasional  . Drug use: No    Review of Systems Per HPI unless specifically indicated above     Objective:    BP 117/60 (BP Location: Right Arm)   Pulse 82   Ht 5\' 9"  (1.753 m)   Wt 225 lb (102.1 kg)   BMI 33.23 kg/m   Wt Readings from Last 3 Encounters:  06/05/19 225 lb (102.1 kg)  01/25/19 231 lb 8 oz (105 kg)  11/09/18 246 lb (111.6 kg)    Physical Exam Vitals signs reviewed.  Constitutional:      General: He is awake. He is not in acute distress.    Appearance: He is well-developed.  HENT:     Head: Normocephalic and atraumatic.  Neck:  Musculoskeletal: Normal range of motion and neck supple.     Vascular: No carotid bruit.  Cardiovascular:     Rate and Rhythm: Normal rate and regular rhythm.     Pulses:          Radial pulses are 2+ on the right side and 2+ on the left side.       Posterior tibial pulses are 1+ on the right side and 1+ on the left side.     Heart sounds: Normal heart sounds, S1 normal and S2 normal.  Pulmonary:     Effort: Pulmonary effort is normal. No respiratory distress.     Breath sounds: Normal breath sounds and air entry.  Skin:    General: Skin is warm and dry.  Neurological:     Mental Status: He is alert and oriented to person,  place, and time.  Psychiatric:        Attention and Perception: Attention normal.        Mood and Affect: Mood and affect normal.        Behavior: Behavior normal. Behavior is cooperative.    Results for orders placed or performed during the hospital encounter of 11/28/18  Glucose, capillary  Result Value Ref Range   Glucose-Capillary 143 (H) 70 - 99 mg/dL      Assessment & Plan:   Problem List Items Addressed This Visit      Cardiovascular and Mediastinum   Essential hypertension Controlled hypertension.  BP goal < 130/80.  Pt is working on lifestyle modifications.  Taking medications tolerating well without side effects. No current complications.  Plan: 1. Continue taking medications without changes today. 2. Obtain labs next appointment  3. Encouraged heart healthy diet and increasing exercise to 30 minutes most days of the week. 4. Check BP 1-2 x per week at home, keep log, and bring to clinic at next appointment. 5. Follow up 6 months.      Aortic atherosclerosis (HCC) No interval ASCVD events.  Stable.  However, patient with chest pain intervally.  Reviewed need to monitor and seek care in ER if persists.  Labs next visit.  Follow-up 6 months or sooner if needed.     Respiratory   Chronic obstructive pulmonary disease (HCC) Stable today on exam.  Medications tolerated without side effects.  Continue at current doses.  Refills provided.  Followup 6 months.      Other   Heart murmur, systolic Stable today on exam. Last echo normal.  No significant changes to symptoms, exercise tolerance.  However, with chest pain may need sooner re-evaluation. Medications tolerated without side effects.  Continue at current doses.  Refills provided.  Followup 6 months.     Inability to attain erection    Other Visit Diagnoses    Right hip pain    -  Primary Worsening.  Severely limiting patient mobility.  Patient has likely back pain vs hip arthritis as primary causes of hip pain.     Plan: 1. START diclofenac gel 4 times daily prn. 2. Xray hip today (RIGHT) 3. Consider orthopedic referral after Xray.  May need to consult for spinal pain management vs hip consult.   Relevant Medications   diclofenac (VOLTAREN) 75 MG EC tablet   Other Relevant Orders   DG HIP UNILAT WITH PELVIS 2-3 VIEWS RIGHT (Completed)      Meds ordered this encounter  Medications  . diclofenac (VOLTAREN) 75 MG EC tablet    Sig: Take 1 tablet (75 mg total)  by mouth 2 (two) times daily. After 14 days, take only as needed    Dispense:  60 tablet    Refill:  0    Order Specific Question:   Supervising Provider    Answer:   Olin Hauser [[id number]]    Follow up plan: Return in about 6 months (around 12/03/2019) for hypertension, COPD.  Cassell Smiles, DNP, AGPCNP-BC Adult Gerontology Primary Care Nurse Practitioner [facility] 06/05/2019, 1:25 PM

## 2019-06-05 NOTE — Patient Instructions (Addendum)
Nathaniel Little,   Thank you for coming in to clinic today.  1. You need a hip xray - Go to the [facility] for your xray any time this week or early next week. - May start diclofenac 75 mg tablet twice daily for pain.  Take every 12 hours for 14 days, then use as needed.  2. Continue your blood pressure medicine without change.  Please schedule a follow-up appointment with Cassell Smiles, AGNP. Return in about 6 months (around 12/03/2019) for hypertension, COPD.  If you have any other questions or concerns, please feel free to call the clinic or send a message through [place]. You may also schedule an earlier appointment if necessary.  You will receive a survey after today's visit either digitally by e-mail or paper by [name]. Your experiences and feedback matter to [name].  Please respond so we know how we are doing as we provide care for you.   Cassell Smiles, DNP, AGNP-BC Adult Gerontology Nurse Practitioner [place]

## 2019-06-06 ENCOUNTER — Ambulatory Visit
Admission: RE | Admit: 2019-06-06 | Discharge: 2019-06-06 | Disposition: A | Discharge status: Home / Self Care | Payer: Medicare Other | Attending: Nurse Practitioner | Admitting: Nurse Practitioner

## 2019-06-06 ENCOUNTER — Ambulatory Visit
Admission: RE | Admit: 2019-06-06 | Discharge: 2019-06-06 | Disposition: A | Discharge status: Home / Self Care | Payer: Medicare Other | Source: Ambulatory Visit | Attending: Nurse Practitioner | Admitting: Nurse Practitioner

## 2019-06-06 DIAGNOSIS — M25551 Pain in right hip: Secondary | ICD-10-CM

## 2019-06-08 ENCOUNTER — Telehealth: Payer: Self-pay

## 2019-06-08 DIAGNOSIS — M25551 Pain in right hip: Secondary | ICD-10-CM

## 2019-06-08 NOTE — Telephone Encounter (Signed)
-----   Message from [name], NP sent at 06/08/2019 11:53 AM EDT ----- Patient has hip arthritis, moderate severity. It is probable that hip pain may be coming from spine (low back).  Recommend considering physical therapy.  Can do home health.  If patient desires, I will place referral.

## 2019-06-08 NOTE — Telephone Encounter (Signed)
lmtcb

## 2019-06-11 ENCOUNTER — Encounter: Payer: Self-pay | Admitting: Nurse Practitioner

## 2019-06-12 NOTE — Telephone Encounter (Signed)
The pt would like to proceed with getting the Physical therapy in his home.

## 2019-06-20 ENCOUNTER — Telehealth: Payer: Self-pay | Admitting: Nurse Practitioner

## 2019-06-20 NOTE — Telephone Encounter (Signed)
Verbal order can be provided for 2 x weekly x 2 weeks and 1 x on week 3.  Please call.

## 2019-06-20 NOTE — Telephone Encounter (Signed)
Nathaniel Little called requesting Physical Therapy 2 time a week for the first 2 weeks and once on week three.

## 2019-06-20 NOTE — Telephone Encounter (Signed)
Jeani Hawking with Home Health need a verbal on Pt. Care. Jeani Hawking call back # is  [phone]

## 2019-06-20 NOTE — Telephone Encounter (Signed)
Nathaniel Little was notified of the verbal.

## 2019-06-25 ENCOUNTER — Encounter: Payer: Self-pay | Admitting: Nurse Practitioner

## 2019-06-25 DIAGNOSIS — M533 Sacrococcygeal disorders, not elsewhere classified: Secondary | ICD-10-CM | POA: Insufficient documentation

## 2019-06-25 DIAGNOSIS — M5136 Other intervertebral disc degeneration, lumbar region: Secondary | ICD-10-CM | POA: Insufficient documentation

## 2019-07-10 ENCOUNTER — Other Ambulatory Visit: Payer: Self-pay | Admitting: Family Medicine

## 2019-07-10 DIAGNOSIS — I739 Peripheral vascular disease, unspecified: Secondary | ICD-10-CM

## 2019-07-10 DIAGNOSIS — I700 Atherosclerosis of aorta: Secondary | ICD-10-CM

## 2019-08-03 ENCOUNTER — Other Ambulatory Visit: Payer: Self-pay | Admitting: Nurse Practitioner

## 2019-08-03 DIAGNOSIS — I10 Essential (primary) hypertension: Secondary | ICD-10-CM

## 2019-08-06 ENCOUNTER — Other Ambulatory Visit: Payer: Self-pay

## 2019-08-06 DIAGNOSIS — M25551 Pain in right hip: Secondary | ICD-10-CM

## 2019-08-06 MED ORDER — DICLOFENAC SODIUM 75 MG PO TBEC: 75.0000 mg | DELAYED_RELEASE_TABLET | Freq: Two times a day (BID) | ORAL | 0 refills | Status: DC

## 2019-08-13 ENCOUNTER — Other Ambulatory Visit: Payer: Self-pay

## 2019-08-13 ENCOUNTER — Ambulatory Visit (INDEPENDENT_AMBULATORY_CARE_PROVIDER_SITE_OTHER): Payer: Medicare Other | Admitting: Family Medicine

## 2019-08-13 ENCOUNTER — Encounter: Payer: Self-pay | Admitting: Family Medicine

## 2019-08-13 VITALS — BP 125/78 | HR 81 | Temp 98.2°F | Resp 22 | Ht 69.5 in | Wt 224.6 lb

## 2019-08-13 DIAGNOSIS — N528 Other male erectile dysfunction: Secondary | ICD-10-CM | POA: Diagnosis not present

## 2019-08-13 MED ORDER — SILDENAFIL CITRATE 20 MG PO TABS: ORAL_TABLET | ORAL | 2 refills | Status: DC

## 2019-08-13 NOTE — Progress Notes (Signed)
Subjective:    Patient ID: Nathaniel Little, male    DOB: October 04, 1946, 73 y.o.   MRN: [id number]  Nathaniel Little is a 73 y.o. male presenting on 08/13/2019 for Establish Care and Hip Pain (right hip)   HPI  Erectile / Sexual Dysfunction He reports that he has had issue with smaller size penis and difficulty with satisfying sexual partners, he often has no issue with ejaculation and his own satisfaction. He has talked to prior Urologist about a possible penile implant in past in [place] but did not pursue due to cost.  - He does admit to having difficulty obtaining full erection, seems to only be partial erection. - History of prostate cancer >7 years ago, treated with radiation therapy. - He has not been able to afford Viagra through insurance in past, has not taken this. He has history of heart murmur but no history of heart disease nitrate medication or other concerns. - Denies active chest pain or dyspnea, penile pain, curvature, swelling redness or other issue  Health Maintenance: UTD Flu vaccine.  Depression screen [facility] 2/9 08/13/2019 06/05/2019 03/05/2019  Decreased Interest 0 0 1  Down, Depressed, Hopeless 1 0 1  PHQ - 2 Score 1 0 2  Altered sleeping 0 3 0  Tired, decreased energy 0 2 1  Change in appetite 0 3 3  Feeling bad or failure about yourself  0 0 0  Trouble concentrating 0 0 0  Moving slowly or fidgety/restless 0 0 1  Suicidal thoughts 0 0 0  PHQ-9 Score 1 8 7   Difficult doing work/chores Not difficult at all Not difficult at all Not difficult at all    Social History   Tobacco Use  . Smoking status: Former Smoker    Years: 68.00    Types: Cigarettes  . Smokeless tobacco: Former Systems developer    Types: Chew, Snuff    Quit date: 05/16/2016  Substance Use Topics  . Alcohol use: Yes    Alcohol/week: 0.0 - 1.0 standard drinks    Comment: occasional  . Drug use: No    Review of Systems Per HPI unless specifically indicated above     Objective:    BP 125/78 (BP Location:  Right Arm, Patient Position: Sitting, Cuff Size: Normal)   Pulse 81   Temp 98.2 F (36.8 C) (Oral)   Resp (!) 22   Ht 5' 9.5" (1.765 m)   Wt 224 lb 9.6 oz (101.9 kg)   SpO2 100%   BMI 32.69 kg/m   Wt Readings from Last 3 Encounters:  08/13/19 224 lb 9.6 oz (101.9 kg)  06/05/19 225 lb (102.1 kg)  01/25/19 231 lb 8 oz (105 kg)    Physical Exam Vitals signs and nursing note reviewed.  Constitutional:      General: He is not in acute distress.    Appearance: He is well-developed. He is not diaphoretic.     Comments: Well-appearing, comfortable, cooperative  HENT:     Head: Normocephalic and atraumatic.  Eyes:     General:        Right eye: No discharge.        Left eye: No discharge.     Conjunctiva/sclera: Conjunctivae normal.  Cardiovascular:     Rate and Rhythm: Normal rate.  Pulmonary:     Effort: Pulmonary effort is normal.  Musculoskeletal:     Comments: Using rolling walker  Skin:    General: Skin is warm and dry.     Findings: No erythema  or rash.  Neurological:     Mental Status: He is alert and oriented to person, place, and time.  Psychiatric:        Behavior: Behavior normal.     Comments: Well groomed, good eye contact, normal speech and thoughts       Results for orders placed or performed during the hospital encounter of 11/28/18  Glucose, capillary  Result Value Ref Range   Glucose-Capillary 143 (H) 70 - 99 mg/dL      Assessment & Plan:   Problem List Items Addressed This Visit    None    Visit Diagnoses    Other male erectile dysfunction    -  Primary   Relevant Medications   sildenafil (REVATIO) 20 MG tablet   Other Relevant Orders   Ambulatory referral to Urology      Clinically with mixed sexual dysfunction symptoms, with erectile dysfunction and also small anatomical penis size with erection by his report, with difficulty satisfying sexual partner. - Prior [place] [facility] -  He has history of prostate cancer >7 years ago, radiation  therapy - He has not taken PDE5 inhibitor before due to cost.  Plan - Start trial on Sildenafil 20mg  generic, take 1-5 pills as advised, printed rx, and coupon Goodrx printed for [facility], he can check [facility] then if too pricey go to [facility] for $18 for 30 pills, with refills, titrate dose up as needed - Also referral sent to Urology [facility] to discuss erectile dysfunction and other options, reconsider future more advanced intervention as discussed if indicated or approved. But he may be able to benefit only from PDE5 therapy - will pursue this first  Meds ordered this encounter  Medications  . sildenafil (REVATIO) 20 MG tablet    Sig: Take 1-5 pills about 30 min prior to sex. Start with 1 and increase as needed.    Dispense:  30 tablet    Refill:  2     Follow up plan: Return in about 3 months (around 11/13/2019) for Re-schedule upcoming visit for February 2021 w/ new PCP.   Nobie Putnam, DO [facility] 08/13/2019, 10:34 AM

## 2019-08-13 NOTE — Patient Instructions (Addendum)
Thank you for coming to the office today.  Bring your information on radiation treatment from [place] to the Urologist here in [place] -[address] floor [address],  [place]  [address] Phone: [phone]  Ordered Sildenafil (generic viagra) 20mg  per pill - can take up to 5 maximum, preferred dose is around 2-3 pills per dosage.  Check [facility] price first, then if too costly - can take the printed prescription and Goodrx coupon to [facility] for $18   Please schedule a Follow-up Appointment to: Return in about 3 months (around 11/13/2019) for Re-schedule upcoming visit for February 2021 w/ Elmyra Ricks.  If you have any other questions or concerns, please feel free to call the office or send a message through [place]. You may also schedule an earlier appointment if necessary.  Additionally, you may be receiving a survey about your experience at our office within a few days to 1 week by e-mail or mail. We value your feedback.  Nobie Putnam, DO [employer]

## 2019-08-16 ENCOUNTER — Other Ambulatory Visit: Payer: Self-pay | Admitting: Nurse Practitioner

## 2019-08-16 DIAGNOSIS — J431 Panlobular emphysema: Secondary | ICD-10-CM

## 2019-08-21 ENCOUNTER — Other Ambulatory Visit: Payer: Self-pay | Admitting: Nurse Practitioner

## 2019-08-21 DIAGNOSIS — G4709 Other insomnia: Secondary | ICD-10-CM

## 2019-08-22 ENCOUNTER — Other Ambulatory Visit: Payer: Self-pay | Admitting: Family Medicine

## 2019-08-22 DIAGNOSIS — J431 Panlobular emphysema: Secondary | ICD-10-CM

## 2019-08-22 MED ORDER — ALBUTEROL SULFATE HFA 108 (90 BASE) MCG/ACT IN AERS: 2.0000 | INHALATION_SPRAY | Freq: Four times a day (QID) | RESPIRATORY_TRACT | 2 refills | Status: DC | PRN

## 2019-09-19 ENCOUNTER — Other Ambulatory Visit: Payer: Self-pay

## 2019-09-19 ENCOUNTER — Emergency Department: Payer: Medicare Other

## 2019-09-19 ENCOUNTER — Emergency Department
Admission: EM | Admit: 2019-09-19 | Discharge: 2019-09-19 | Disposition: A | Discharge status: Home / Self Care | Payer: Medicare Other | Attending: Emergency Medicine | Admitting: Emergency Medicine

## 2019-09-19 DIAGNOSIS — Z7984 Long term (current) use of oral hypoglycemic drugs: Secondary | ICD-10-CM | POA: Diagnosis not present

## 2019-09-19 DIAGNOSIS — Z79899 Other long term (current) drug therapy: Secondary | ICD-10-CM | POA: Insufficient documentation

## 2019-09-19 DIAGNOSIS — J449 Chronic obstructive pulmonary disease, unspecified: Secondary | ICD-10-CM | POA: Insufficient documentation

## 2019-09-19 DIAGNOSIS — Z7982 Long term (current) use of aspirin: Secondary | ICD-10-CM | POA: Insufficient documentation

## 2019-09-19 DIAGNOSIS — M4722 Other spondylosis with radiculopathy, cervical region: Secondary | ICD-10-CM

## 2019-09-19 DIAGNOSIS — M25511 Pain in right shoulder: Secondary | ICD-10-CM | POA: Diagnosis present

## 2019-09-19 DIAGNOSIS — I10 Essential (primary) hypertension: Secondary | ICD-10-CM | POA: Diagnosis not present

## 2019-09-19 DIAGNOSIS — F17210 Nicotine dependence, cigarettes, uncomplicated: Secondary | ICD-10-CM | POA: Insufficient documentation

## 2019-09-19 MED ORDER — LIDOCAINE 5 % EX PTCH
1.0000 | MEDICATED_PATCH | CUTANEOUS | Status: DC
  Administered 2019-09-19: 14:00:00 1 via TRANSDERMAL
  Filled 2019-09-19: qty 1

## 2019-09-19 MED ORDER — LIDOCAINE 5 % EX PTCH: 1.0000 | MEDICATED_PATCH | Freq: Two times a day (BID) | CUTANEOUS | 0 refills | Status: DC

## 2019-09-19 MED ORDER — TRAMADOL HCL 50 MG PO TABS: 50.0000 mg | ORAL_TABLET | Freq: Two times a day (BID) | ORAL | 0 refills | Status: AC | PRN

## 2019-09-19 NOTE — ED Provider Notes (Signed)
[facility] Emergency Department Provider Note   ____________________________________________   First MD Initiated Contact with Patient 09/19/19 1411     (approximate)  I have reviewed the triage vital signs and the nursing notes.   HISTORY  Chief Complaint Shoulder Pain    HPI Nathaniel Little is a 73 y.o. male patient complain of radicular pain from his neck to the right shoulder for 1 week.  Patient denies provoking incident for complaint.  Patient states pain increases with abduction and overhead reaching.  Patient described the pain as "achy".  Patient applied BenGay to the area last night.         Past Medical History:  Diagnosis Date  . (HFpEF) heart failure with preserved ejection fraction ([place])    a. 08/2017 Echo: EF 60-65%, Gr1 DD.  [name] Arthritis   . Chest pain    a. 08/2017 Lexiscan MV: EF 53%, no ischemia-->Low risk.  [name] COPD (chronic obstructive pulmonary disease) ([place])   . Depression   . Diabetes mellitus without complication ([place])   . Dyspnea   . Heart murmur   . History of orthopnea   . Hypertension   . Lymphedema   . Moderate aortic stenosis    a. 08/2017 Echo: EF 60-65%, no rwma, GR1 DD, Mod Ao Stenosis, mean grad 22mg. Valve area (VTI): 1.04 cm^2. Mildly dil LA.  . Morbid obesity ([place]   . Prostate cancer ([place] 03/2013   ? PANCREATIC  . Sleep apnea   . Sleep apnea with use of continuous positive airway pressure (CPAP)   . Tobacco abuse   . Tremor    HEAD  . Wheezing    OCCAS    Patient Active Problem List   Diagnosis Date Noted  . Lumbar degenerative disc disease 06/25/2019  . Sacroiliac joint dysfunction of both sides 06/25/2019  . Moderate aortic stenosis 01/19/2019  . Chronic heart failure with preserved ejection fraction ([place] 01/19/2019  . Tobacco abuse 01/19/2019  . Hyperlipidemia 07/20/2018  . IPMN (intraductal papillary mucinous neoplasm) 12/24/2017  . Claudication in peripheral vascular disease ([place]  08/22/2017  . Chest pain 08/22/2017  . Pancreatic mass 08/20/2017  . Aortic atherosclerosis ([place] 07/25/2017  . BMI 38.0-38.9,adult 02/09/2017  . Arthritis pain of hip 01/07/2017  . Chronic obstructive pulmonary disease ([place] 01/07/2017  . GERD (gastroesophageal reflux disease) 01/07/2017  . Inability to attain erection 01/07/2017  . Tobacco smoker within last 12 months 01/07/2017  . Essential hypertension 01/07/2017  . Heart murmur, systolic 053/66/4403 . Depression 01/06/2017  . Sleep apnea with use of continuous positive airway pressure (CPAP) 01/06/2017  . Personal history of prostate cancer 03/04/2013    Past Surgical History:  Procedure Laterality Date  . ADENOIDECTOMY    . APPENDECTOMY    . CATARACT EXTRACTION W/PHACO Left 11/28/2018   Procedure: CATARACT EXTRACTION PHACO AND INTRAOCULAR LENS PLACEMENT (IOC) LEFT, DIABETIC;  Surgeon: PBirder Robson MD;  Location: [facility] ORS;  Service: Ophthalmology;  Laterality: Left;  [place] 00:56 CDE 9.44 Fluid pack lot # [id number]  . EUS N/A 10/13/2017   Procedure: FULL UPPER ENDOSCOPIC ULTRASOUND (EUS) RADIAL;  Surgeon: [name];  [facility];  Service: Gastroenterology;  Laterality: N/A;  . PANCREATICODUODENECTOMY  07/23/2018  . TONSILLECTOMY      Prior to Admission medications   Medication Sig Start Date End Date Taking? Authorizing Provider  ACCU-CHEK AVIVA PLUS test strip  05/24/19   [provider]  albuterol (PROVENTIL) (2.5 MG/3ML) 0.083% nebulizer solution  Take 2.5 mg by nebulization every 6 (six) hours as needed for wheezing or shortness of breath.    [provider]  albuterol (VENTOLIN HFA) 108 (90 Base) MCG/ACT inhaler Inhale 2 puffs into the lungs every 6 (six) hours as needed for wheezing or shortness of breath. 08/22/19   Karamalegos, Devonne Doughty, DO  aspirin EC 81 MG tablet Take 81 mg by mouth daily.    [provider]  blood glucose meter kit and supplies Dispense  based on patient and insurance preference. Use up to four times daily as directed. (FOR ICD-10 E10.9, E11.9). 08/18/18   [name], NP  cloNIDine (CATAPRES) 0.1 MG tablet TAKE 1 TABLET BY MOUTH TWICE DAILY 03/23/19   Parks Ranger, Devonne Doughty, DO  clopidogrel (PLAVIX) 75 MG tablet TAKE 1 TABLET BY MOUTH ONCE DAILY 07/12/19   [name], NP  diclofenac (VOLTAREN) 75 MG EC tablet Take 1 tablet (75 mg total) by mouth 2 (two) times daily. After 14 days, take only as needed 08/06/19   Karamalegos, Devonne Doughty, DO  docusate sodium (COLACE) 100 MG capsule Take 100 mg by mouth daily as needed for mild constipation.    [provider]  gabapentin (NEURONTIN) 100 MG capsule Take 1 capsule at night before bed. May increase to 2 capsules after one week if needed 01/29/19   [provider]  hydrochlorothiazide (HYDRODIURIL) 12.5 MG tablet TAKE 1 TABLET BY MOUTH ONCE DAILY 08/03/19   Karamalegos, Devonne Doughty, DO  ipratropium-albuterol (DUONEB) 0.5-2.5 (3) MG/3ML SOLN Take 3 mLs by nebulization every 6 (six) hours as needed (wheezing, shortness of breath). 04/20/17   [name], NP  lidocaine (LIDODERM) 5 % Place 1 patch onto the skin every 12 (twelve) hours. Remove & Discard patch within 12 hours or as directed by MD 09/19/19 09/18/20  Sable Feil, PA-C  metFORMIN (GLUCOPHAGE) 500 MG tablet Take 1,000 mg by mouth 2 (two) times daily.  11/04/18   [provider]  ondansetron (ZOFRAN) 4 MG tablet Take 4 mg by mouth every 8 (eight) hours as needed for nausea or vomiting.    [provider]  pantoprazole (PROTONIX) 40 MG tablet Take 1 tablet (40 mg total) by mouth daily. 11/20/18 11/20/19  [name], NP  sildenafil (REVATIO) 20 MG tablet Take 1-5 pills about 30 min prior to sex. Start with 1 and increase as needed. 08/13/19   Karamalegos, Devonne Doughty, DO  simethicone (GAS-X) 80 MG chewable tablet Chew 1 tablet (80 mg total) by mouth every 6 (six)  hours as needed for flatulence. Patient not taking: Reported on 08/13/2019 11/20/18   [name], NP  SYMBICORT 80-4.5 MCG/ACT inhaler INHALE 2 PUFFS BY MOUTH TWICE DAILY 08/17/19   Parks Ranger, Devonne Doughty, DO  tiotropium (SPIRIVA HANDIHALER) 18 MCG inhalation capsule Place 1 capsule (18 mcg total) into inhaler and inhale daily. Patient taking differently: Place 18 mcg into inhaler and inhale 2 (two) times daily as needed (for respiratory issues.).  11/20/18   [name], NP  traMADol (ULTRAM) 50 MG tablet Take 1 tablet (50 mg total) by mouth every 12 (twelve) hours as needed for up to 5 days. 09/19/19 09/24/19  Sable Feil, PA-C  traZODone (DESYREL) 100 MG tablet TAKE 1 TABLET BY MOUTH AT BEDTIME AS NEEDED SLEEP 08/22/19   Olin Hauser, DO    Allergies Codeine  Family History  Problem Relation Age of Onset  . Breast cancer Mother   . Cancer Father  Black Lung  . Cerebral palsy Brother   . Tuberculosis Paternal Uncle   . Prostate cancer Neg Hx   . Kidney cancer Neg Hx   . Bladder Cancer Neg Hx     Social History Social History   Tobacco Use  . Smoking status: Current Every Day Smoker    Packs/day: 0.50    Years: 68.00    Pack years: 34.00    Types: Cigarettes  . Smokeless tobacco: Former Systems developer    Types: Chew, Snuff    Quit date: 05/16/2016  Substance Use Topics  . Alcohol use: Yes    Alcohol/week: 0.0 - 1.0 standard drinks    Comment: occasional  . Drug use: No    Review of Systems  Constitutional: No fever/chills Eyes: No visual changes. ENT: No sore throat. Cardiovascular: Denies chest pain. Respiratory: Denies shortness of breath. Gastrointestinal: No abdominal pain.  No nausea, no vomiting.  No diarrhea.  No constipation. Genitourinary: Negative for dysuria. Musculoskeletal: Posterior neck and left shoulder pain. Skin: Negative for rash. Neurological: Negative for headaches, focal weakness or numbness. Psychiatric:  Depression.  Endocrine: Diabetes and  Hypertension. Allergic/Immunilogical: Codeine.  ____________________________________________   PHYSICAL EXAM:  VITAL SIGNS: ED Triage Vitals [09/19/19 1334]  Enc Vitals Group     BP (!) 170/93     Pulse Rate 79     Resp 18     Temp 98.7 F (37.1 C)     Temp Source Oral     SpO2 97 %     Weight 224 lb (101.6 kg)     Height 5' 9"  (1.753 m)     Head Circumference      Peak Flow      Pain Score 10     Pain Loc      Pain Edu?      Excl. in [place]?    Constitutional: Alert and oriented. Well appearing and in no acute distress. Neck:  No cervical spine tenderness to palpation C3-C6. Cardiovascular: Normal rate, regular rhythm. Grossly normal heart sounds.  Good peripheral circulation.  Elevated blood pressure. Respiratory: Normal respiratory effort.  No retractions. Lungs CTAB. Musculoskeletal: No lower extremity tenderness nor edema.  No joint effusions. Neurologic:  Normal speech and language. No gross focal neurologic deficits are appreciated. No gait instability. Skin:  Skin is warm, dry and intact. No rash noted.  No edema or erythema. Psychiatric: Mood and affect are normal. Speech and behavior are normal.  ____________________________________________   LABS (all labs ordered are listed, but only abnormal results are displayed)  Labs Reviewed - No data to display ____________________________________________  EKG   ____________________________________________  RADIOLOGY  ED MD interpretation:    Official radiology report(s): DG Cervical Spine 2-3 Views  Result Date: 09/19/2019 CLINICAL DATA:  Neck pain extending into the right shoulder for the past week. No known injury. EXAM: CERVICAL SPINE - 2-3 VIEW COMPARISON:  09/19/2019 FINDINGS: Mild anterior spur formation at multiple levels and moderate anterior spur formation at the C6-7 level. No significant posterior spur formation at any level. Normal alignment. Bilateral carotid  artery calcifications. There are no oblique views for assessing the neural foramina. IMPRESSION: 1. Multilevel degenerative changes. 2. No acute abnormality. 3. Bilateral carotid artery atheromatous calcifications. Electronically Signed   By: Claudie Revering M.D.   On: 09/19/2019 15:03    ____________________________________________   PROCEDURES  Procedure(s) performed (including Critical Care):  Procedures   ____________________________________________   INITIAL IMPRESSION / ASSESSMENT AND PLAN / ED COURSE  As  part of my medical decision making, I reviewed the following data within the [place]   Patient presents with neck pain rating to the right upper extremity.  Discussed x-ray findings with patient.  Physical exam consistent with cervical radiculopathy..  Patient given discharge care instruction advised take medication as directed.  Patient vies follow-up PCP.       Colen Eltzroth was evaluated in Emergency Department on 09/19/2019 for the symptoms described in the history of present illness. He was evaluated in the context of the global COVID-19 pandemic, which necessitated consideration that the patient might be at risk for infection with the SARS-CoV-2 virus that causes COVID-19. Institutional protocols and algorithms that pertain to the evaluation of patients at risk for COVID-19 are in a state of rapid change based on information released by regulatory bodies including the CDC and federal and state organizations. These policies and algorithms were followed during the patient's care in the ED.       ____________________________________________   FINAL CLINICAL IMPRESSION(S) / ED DIAGNOSES  Final diagnoses:  Osteoarthritis of spine with radiculopathy, cervical region     ED Discharge Orders         Ordered    lidocaine (LIDODERM) 5 %  Every 12 hours     09/19/19 1513    traMADol (ULTRAM) 50 MG tablet  Every 12 hours PRN     09/19/19 1513            Note:  This document was prepared using Dragon voice recognition software and may include unintentional dictation errors.    Sable Feil, PA-C 09/19/19 1517    Duffy Bruce, MD 09/21/19 [phone]

## 2019-09-19 NOTE — ED Triage Notes (Signed)
Pt comes into the ED via EMS from home with c/o pain radiating back of neck down into the right shoulder for the past week, denies injury. Just woke up with it one day.

## 2019-09-19 NOTE — Discharge Instructions (Signed)
Follow discharge care instruction take medication as directed.  Be advised tramadol may cause drowsiness.

## 2019-09-24 ENCOUNTER — Ambulatory Visit (INDEPENDENT_AMBULATORY_CARE_PROVIDER_SITE_OTHER): Payer: Medicare Other | Admitting: Urology

## 2019-09-24 ENCOUNTER — Encounter: Payer: Self-pay | Admitting: Urology

## 2019-09-24 ENCOUNTER — Other Ambulatory Visit: Payer: Self-pay

## 2019-09-24 VITALS — BP 142/71 | HR 87 | Ht 69.0 in | Wt 227.0 lb

## 2019-09-24 DIAGNOSIS — N5201 Erectile dysfunction due to arterial insufficiency: Secondary | ICD-10-CM | POA: Diagnosis not present

## 2019-09-24 DIAGNOSIS — C61 Malignant neoplasm of prostate: Secondary | ICD-10-CM | POA: Diagnosis not present

## 2019-09-24 HISTORY — DX: Erectile dysfunction due to arterial insufficiency: N52.01

## 2019-09-24 NOTE — Patient Instructions (Signed)
Please call back with Urology and Radiation information to collect records from [place]

## 2019-09-24 NOTE — Progress Notes (Signed)
09/24/2019 9:02 AM   Nathaniel Little 08-Dec-1945 [id number]  Referring provider: Olin Hauser, DO [address] [place],   [address]  Chief Complaint  Patient presents with  . Erectile Dysfunction    New patient    HPI: Nathaniel Little is a 73 y.o. male who was previously referred here for ED and saw Dr. Pilar Jarvis in July 2018.  He has a history of prostate cancer treated with radiation in [place] several years ago.  Last PSA September 2019 was 2.0.  He had ED prior to his radiation treatment and always complained of a smaller size penis.  He currently has partial erections which are typically not firm enough for penetration.  Organic risk factors include heart disease, diabetes, hypertension, tobacco abuse and prostate cancer treatment.  When he saw Dr. Pilar Jarvis in 2018 a prescription of generic sildenafil was sent to his pharmacy however he never tried due to cost.  He was recently given an Rx of generic sildenafil by Dr. Parks Ranger which he used up to 60 mg dose and noted approximately 50% improvement in his erections.  Denies pain or curvature with erections.  He has mild lower urinary tract symptoms of urinary frequency and nocturia x2.  PMH: Past Medical History:  Diagnosis Date  . (HFpEF) heart failure with preserved ejection fraction ([place])    a. 08/2017 Echo: EF 60-65%, Gr1 DD.  [name] Arthritis   . Chest pain    a. 08/2017 Lexiscan MV: EF 53%, no ischemia-->Low risk.  [name] COPD (chronic obstructive pulmonary disease) ([place])   . Depression   . Diabetes mellitus without complication ([place])   . Dyspnea   . Heart murmur   . History of orthopnea   . Hypertension   . Lymphedema   . Moderate aortic stenosis    a. 08/2017 Echo: EF 60-65%, no rwma, GR1 DD, Mod Ao Stenosis, mean grad 39mg. Valve area (VTI): 1.04 cm^2. Mildly dil LA.  . Morbid obesity ([place]   . [place]state cancer ([place] 03[place]2013   ? PANCREATIC  . Sleep apnea   . Sleep apnea with use of continuous positive airway  pressure (CPAP)   . Tobacco abuse   . Tremor    HEAD  . Wheezing    OCCAS    Surgical History: Past Surgical History:  Procedure Laterality Date  . ADENOIDECTOMY    . APPENDECTOMY    . CATARACT EXTRACTION W/PHACO Left 11/28/2018   Procedure: CATARACT EXTRACTION PHACO AND INTRAOCULAR LENS PLACEMENT (IOC) LEFT, DIABETIC;  Surgeon: PBirder Robson MD;  Location: [facility] ORS;  Service: Ophthalmology;  Laterality: Left;  [place] 00:56 CDE 9.44 Fluid pack lot # [id number]  . EUS N/A 10/13/2017   Procedure: FULL UPPER ENDOSCOPIC ULTRASOUND (EUS) RADIAL;  Surgeon: [name];  [facility];  Service: Gastroenterology;  Laterality: N/A;  . PANCREATICODUODENECTOMY  07/23/2018  . TONSILLECTOMY      Home Medications:  Allergies as of 09/24/2019      Reactions   Codeine Itching      Medication List       Accurate as of September 24, 2019  9:02 AM. If you have any questions, ask your nurse or doctor.        STOP taking these medications   lidocaine 5 % Commonly known as: Lidoderm Stopped by: SAbbie Sons MD   simethicone 80 MG chewable tablet Commonly known as: Gas-X Stopped by: SAbbie Sons MD     TAKE these medications   Accu-Chek  Aviva Plus test strip Generic drug: glucose blood   albuterol (2.5 MG/3ML) 0.083% nebulizer solution Commonly known as: PROVENTIL Take 2.5 mg by nebulization every 6 (six) hours as needed for wheezing or shortness of breath.   albuterol 108 (90 Base) MCG/ACT inhaler Commonly known as: VENTOLIN HFA Inhale 2 puffs into the lungs every 6 (six) hours as needed for wheezing or shortness of breath.   aspirin EC 81 MG tablet Take 81 mg by mouth daily.   blood glucose meter kit and supplies Dispense based on patient and insurance preference. Use up to four times daily as directed. (FOR ICD-10 E10.9, E11.9).   cloNIDine 0.1 MG tablet Commonly known as: CATAPRES TAKE 1 TABLET BY MOUTH TWICE DAILY   clopidogrel 75 MG  tablet Commonly known as: PLAVIX TAKE 1 TABLET BY MOUTH ONCE DAILY   diclofenac 75 MG EC tablet Commonly known as: VOLTAREN Take 1 tablet (75 mg total) by mouth 2 (two) times daily. After 14 days, take only as needed   docusate sodium 100 MG capsule Commonly known as: COLACE Take 100 mg by mouth daily as needed for mild constipation.   gabapentin 100 MG capsule Commonly known as: NEURONTIN Take 1 capsule at night before bed. May increase to 2 capsules after one week if needed   hydrochlorothiazide 12.5 MG tablet Commonly known as: HYDRODIURIL TAKE 1 TABLET BY MOUTH ONCE DAILY   ipratropium-albuterol 0.5-2.5 (3) MG/3ML Soln Commonly known as: DUONEB Take 3 mLs by nebulization every 6 (six) hours as needed (wheezing, shortness of breath).   metFORMIN 500 MG tablet Commonly known as: GLUCOPHAGE Take 1,000 mg by mouth 2 (two) times daily.   ondansetron 4 MG tablet Commonly known as: ZOFRAN Take 4 mg by mouth every 8 (eight) hours as needed for nausea or vomiting.   pantoprazole 40 MG tablet Commonly known as: PROTONIX Take 1 tablet (40 mg total) by mouth daily.   sildenafil 20 MG tablet Commonly known as: REVATIO Take 1-5 pills about 30 min prior to sex. Start with 1 and increase as needed.   Symbicort 80-4.5 MCG/ACT inhaler Generic drug: budesonide-formoterol INHALE 2 PUFFS BY MOUTH TWICE DAILY   tiotropium 18 MCG inhalation capsule Commonly known as: Spiriva HandiHaler Place 1 capsule (18 mcg total) into inhaler and inhale daily. What changed:   when to take this  reasons to take this   traMADol 50 MG tablet Commonly known as: Ultram Take 1 tablet (50 mg total) by mouth every 12 (twelve) hours as needed for up to 5 days.   traZODone 100 MG tablet Commonly known as: DESYREL TAKE 1 TABLET BY MOUTH AT BEDTIME AS NEEDED SLEEP       Allergies:  Allergies  Allergen Reactions  . Codeine Itching    Family History: Family History  Problem Relation Age of  Onset  . Breast cancer Mother   . Cancer Father        Black Lung  . Cerebral palsy Brother   . Tuberculosis Paternal Uncle   . Prostate cancer Neg Hx   . Kidney cancer Neg Hx   . Bladder Cancer Neg Hx     Social History:  reports that he has been smoking cigarettes. He has a 34.00 pack-year smoking history. He quit smokeless tobacco use about 3 years ago.  His smokeless tobacco use included chew and snuff. He reports previous alcohol use. He reports that he does not use drugs.  ROS: UROLOGY Frequent Urination?: Yes Hard to postpone urination?: No Burning/pain  with urination?: No Get up at night to urinate?: Yes Leakage of urine?: No Urine stream starts and stops?: No Trouble starting stream?: No Do you have to strain to urinate?: No Blood in urine?: No Urinary tract infection?: No Sexually transmitted disease?: No Injury to kidneys or bladder?: No Painful intercourse?: No Weak stream?: No Erection problems?: Yes Penile pain?: No  Gastrointestinal Nausea?: No Vomiting?: No Indigestion/heartburn?: No Diarrhea?: No Constipation?: No  Constitutional Fever: No Night sweats?: No Weight loss?: No Fatigue?: No  Skin Skin rash/lesions?: No Itching?: No  Eyes Blurred vision?: No Double vision?: No  Ears/Nose/Throat Sore throat?: No Sinus problems?: No  Hematologic/Lymphatic Swollen glands?: No Easy bruising?: No  Cardiovascular Leg swelling?: Yes Chest pain?: No  Respiratory Cough?: No Shortness of breath?: No  Endocrine Excessive thirst?: Yes  Musculoskeletal Back pain?: No Joint pain?: Yes  Neurological Headaches?: No Dizziness?: No  Psychologic Depression?: No Anxiety?: No  Physical Exam: BP (!) 142/71   Pulse 87   Ht 5' 9"  (1.753 m)   Wt 227 lb (103 kg)   BMI 33.52 kg/m   Constitutional:  Alert and oriented, No acute distress. HEENT: [place] AT, moist mucus membranes.  Trachea midline, no masses. Cardiovascular: No clubbing, cyanosis,  or edema. Respiratory: Normal respiratory effort, no increased work of breathing. GI: Abdomen is soft, nontender, nondistended, no abdominal masses GU: Phallus uncircumcised without lesions.  Testes descended bilaterally without masses or tenderness. Skin: No rashes, bruises or suspicious lesions. Neurologic: Grossly intact, no focal deficits, moving all 4 extremities. Psychiatric: Normal mood and affect.   Assessment & Plan:    - Erectile dysfunction Some improvement with generic sildenafil.  He has 10 tablets left and I recommended he try 100 mg on 2 occasions.  Other options were discussed including intracavernosal injections, vacuum erection devices and penile implant surgery.  He requested literature on the intracavernosal injections.  If the sildenafil is effective at 100 mg he will call back for a refill.  If he desires to pursue other treatments he will also call back.  - History prostate cancer ([place]) Last PSA was 06/2018.  PSA was ordered today.   Abbie Sons, [place] [address] [place], [place] [address] [phone]

## 2019-09-25 ENCOUNTER — Other Ambulatory Visit: Payer: Self-pay | Admitting: Urology

## 2019-09-25 DIAGNOSIS — C61 Malignant neoplasm of prostate: Secondary | ICD-10-CM

## 2019-09-25 LAB — PSA: Prostate Specific Ag, Serum: 3.2 ng/mL (ref 0.0–4.0)

## 2019-09-26 ENCOUNTER — Telehealth: Payer: Self-pay

## 2019-09-26 NOTE — Telephone Encounter (Signed)
Left patient mess to call 

## 2019-09-26 NOTE — Telephone Encounter (Signed)
-----   Message from Abbie Sons, MD sent at 09/25/2019  4:36 PM EST ----- PSA has increased to 3.2.  He has been treated with radiation of prostate cancer and his PSA is rising.  Recommend scheduling CT of the abdomen pelvis with contrast.  Order was entered.  Will call with results

## 2019-10-03 NOTE — Telephone Encounter (Signed)
Left pt mess to call 

## 2019-10-05 DIAGNOSIS — I2510 Atherosclerotic heart disease of native coronary artery without angina pectoris: Secondary | ICD-10-CM

## 2019-10-05 HISTORY — DX: Atherosclerotic heart disease of native coronary artery without angina pectoris: I25.10

## 2019-10-06 ENCOUNTER — Other Ambulatory Visit: Payer: Self-pay | Admitting: Family Medicine

## 2019-10-06 DIAGNOSIS — M25551 Pain in right hip: Secondary | ICD-10-CM

## 2019-10-09 NOTE — Telephone Encounter (Signed)
Patient notified and will contact CT to schedule scan

## 2019-10-14 ENCOUNTER — Encounter: Payer: Self-pay | Admitting: Emergency Medicine

## 2019-10-14 ENCOUNTER — Emergency Department: Payer: Medicare Other

## 2019-10-14 ENCOUNTER — Emergency Department
Admission: EM | Admit: 2019-10-14 | Discharge: 2019-10-14 | Disposition: A | Discharge status: Home / Self Care | Payer: Medicare Other | Attending: Emergency Medicine | Admitting: Emergency Medicine

## 2019-10-14 ENCOUNTER — Other Ambulatory Visit: Payer: Self-pay

## 2019-10-14 DIAGNOSIS — Z7901 Long term (current) use of anticoagulants: Secondary | ICD-10-CM | POA: Insufficient documentation

## 2019-10-14 DIAGNOSIS — M19011 Primary osteoarthritis, right shoulder: Secondary | ICD-10-CM | POA: Insufficient documentation

## 2019-10-14 DIAGNOSIS — M25511 Pain in right shoulder: Secondary | ICD-10-CM | POA: Diagnosis present

## 2019-10-14 DIAGNOSIS — Z7982 Long term (current) use of aspirin: Secondary | ICD-10-CM | POA: Insufficient documentation

## 2019-10-14 DIAGNOSIS — J449 Chronic obstructive pulmonary disease, unspecified: Secondary | ICD-10-CM | POA: Insufficient documentation

## 2019-10-14 DIAGNOSIS — I110 Hypertensive heart disease with heart failure: Secondary | ICD-10-CM | POA: Diagnosis not present

## 2019-10-14 DIAGNOSIS — F17210 Nicotine dependence, cigarettes, uncomplicated: Secondary | ICD-10-CM | POA: Diagnosis not present

## 2019-10-14 DIAGNOSIS — Z7984 Long term (current) use of oral hypoglycemic drugs: Secondary | ICD-10-CM | POA: Diagnosis not present

## 2019-10-14 DIAGNOSIS — Z79899 Other long term (current) drug therapy: Secondary | ICD-10-CM | POA: Insufficient documentation

## 2019-10-14 DIAGNOSIS — I5032 Chronic diastolic (congestive) heart failure: Secondary | ICD-10-CM | POA: Diagnosis not present

## 2019-10-14 LAB — GLUCOSE, CAPILLARY: Glucose-Capillary: 228 mg/dL — ABNORMAL HIGH (ref 70–99)

## 2019-10-14 MED ORDER — DICLOFENAC SODIUM 1 % EX GEL: 2.0000 g | Freq: Four times a day (QID) | CUTANEOUS | 0 refills | Status: DC

## 2019-10-14 MED ORDER — KETOROLAC TROMETHAMINE 30 MG/ML IJ SOLN
15.0000 mg | Freq: Once | INTRAMUSCULAR | Status: AC
  Administered 2019-10-14: 08:00:00 15 mg via INTRAMUSCULAR
  Filled 2019-10-14: qty 1

## 2019-10-14 NOTE — ED Notes (Signed)
Patient to triage via wheelchair by EMS.  Per EMS patient has right shoulder pain for 3 weeks. EMS vitals - BP 177/87, HR - 92, pulse oxi97% on room air.

## 2019-10-14 NOTE — ED Notes (Signed)
See triage note Presents with right shoulder pain for the past 3 weeks   Denies any injury  No deformity noted   Good pulses

## 2019-10-14 NOTE — Discharge Instructions (Signed)
Follow-up with your primary care provider if any continued problems.  You may also need to see an orthopedist.  Dr. Roland Rack is on-call for orthopedics and is in [facility] if additional management of your shoulder pain is needed.  You may use ice or heat to your shoulder as needed for discomfort.  An injection of Toradol was given to you while in the ED.  This is for arthritis pain which is causing her shoulder to hurt.  Also a prescription for Voltaren gel was sent to your pharmacy.  This medication can be used 4 times a day to your shoulder.  This is a medication and not like the IcyHot that you have been using.  There are few medicines that we can use with your blood sugar being elevated and also being on a blood thinner.

## 2019-10-14 NOTE — ED Triage Notes (Signed)
Reports right shoulder pain for the past 3 weeks, denies any type of injury.

## 2019-10-14 NOTE — ED Provider Notes (Signed)
[facility] Emergency Department Provider Note  ____________________________________________   None    (approximate)  I have reviewed the triage vital signs and the nursing notes.   HISTORY  Chief Complaint Shoulder Pain   HPI Nathaniel Little is a 74 y.o. male presents to the ED via EMS with complaint of right shoulder pain for the last 3 weeks.  Patient states there is been no injury.  Patient states he was seen before and treated for his neck which did not help with his shoulder.  He also thinks that his right shoulder is sitting lower than his left shoulder but he has done nothing to cause a dislocation.  Pain is increased with range of motion.  He rates his pain as a 10/10.     Past Medical History:  Diagnosis Date  . (HFpEF) heart failure with preserved ejection fraction ([place])    a. 08/2017 Echo: EF 60-65%, Gr1 DD.  [name] Arthritis   . Chest pain    a. 08/2017 Lexiscan MV: EF 53%, no ischemia-->Low risk.  [name] COPD (chronic obstructive pulmonary disease) ([place])   . Depression   . Diabetes mellitus without complication ([place])   . Dyspnea   . Heart murmur   . History of orthopnea   . Hypertension   . Lymphedema   . Moderate aortic stenosis    a. 08/2017 Echo: EF 60-65%, no rwma, GR1 DD, Mod Ao Stenosis, mean grad 28mg. Valve area (VTI): 1.04 cm^2. Mildly dil LA.  . Morbid obesity ([place]   . Prostate cancer ([place] 03/2013   ? PANCREATIC  . Sleep apnea   . Sleep apnea with use of continuous positive airway pressure (CPAP)   . Tobacco abuse   . Tremor    HEAD  . Wheezing    OCCAS    Patient Active Problem List   Diagnosis Date Noted  . Erectile dysfunction due to arterial insufficiency 09/24/2019  . Lumbar degenerative disc disease 06/25/2019  . Sacroiliac joint dysfunction of both sides 06/25/2019  . Moderate aortic stenosis 01/19/2019  . Chronic heart failure with preserved ejection fraction ([place] 01/19/2019  . Tobacco abuse 01/19/2019  .  Hyperlipidemia 07/20/2018  . IPMN (intraductal papillary mucinous neoplasm) 12/24/2017  . Claudication in peripheral vascular disease ([place] 08/22/2017  . Chest pain 08/22/2017  . Pancreatic mass 08/20/2017  . Aortic atherosclerosis ([place] 07/25/2017  . BMI 38.0-38.9,adult 02/09/2017  . Arthritis pain of hip 01/07/2017  . Chronic obstructive pulmonary disease ([place] 01/07/2017  . GERD (gastroesophageal reflux disease) 01/07/2017  . Inability to attain erection 01/07/2017  . Tobacco smoker within last 12 months 01/07/2017  . Essential hypertension 01/07/2017  . Heart murmur, systolic 084/66/5993 . Depression 01/06/2017  . Sleep apnea with use of continuous positive airway pressure (CPAP) 01/06/2017  . Personal history of prostate cancer 03/04/2013    Past Surgical History:  Procedure Laterality Date  . ADENOIDECTOMY    . APPENDECTOMY    . CATARACT EXTRACTION W/PHACO Left 11/28/2018   Procedure: CATARACT EXTRACTION PHACO AND INTRAOCULAR LENS PLACEMENT (IOC) LEFT, DIABETIC;  Surgeon: PBirder Robson MD;  Location: [facility] ORS;  Service: Ophthalmology;  Laterality: Left;  [place] 00:56 CDE 9.44 Fluid pack lot # [id number]  . EUS N/A 10/13/2017   Procedure: FULL UPPER ENDOSCOPIC ULTRASOUND (EUS) RADIAL;  Surgeon: [name];  [facility];  Service: Gastroenterology;  Laterality: N/A;  . PANCREATICODUODENECTOMY  07/23/2018  . TONSILLECTOMY      Prior to Admission medications  Medication Sig Start Date End Date Taking? Authorizing Provider  ACCU-CHEK AVIVA PLUS test strip  05/24/19   [provider]  albuterol (PROVENTIL) (2.5 MG/3ML) 0.083% nebulizer solution Take 2.5 mg by nebulization every 6 (six) hours as needed for wheezing or shortness of breath.    [provider]  albuterol (VENTOLIN HFA) 108 (90 Base) MCG/ACT inhaler Inhale 2 puffs into the lungs every 6 (six) hours as needed for wheezing or shortness of breath. 08/22/19   Karamalegos,  Devonne Doughty, DO  aspirin EC 81 MG tablet Take 81 mg by mouth daily.    [provider]  blood glucose meter kit and supplies Dispense based on patient and insurance preference. Use up to four times daily as directed. (FOR ICD-10 E10.9, E11.9). 08/18/18   [name], NP  cloNIDine (CATAPRES) 0.1 MG tablet TAKE 1 TABLET BY MOUTH TWICE DAILY 03/23/19   Parks Ranger, Devonne Doughty, DO  clopidogrel (PLAVIX) 75 MG tablet TAKE 1 TABLET BY MOUTH ONCE DAILY 07/12/19   [name], NP  diclofenac (VOLTAREN) 75 MG EC tablet TAKE 1 TABLET BY MOUTH TWICE DAILY. AFTER 14 DAYS TAKE ONLY AS NEEDED 10/08/19   Parks Ranger, Devonne Doughty, DO  diclofenac Sodium (VOLTAREN) 1 % GEL Apply 2 g topically 4 (four) times daily. 10/14/19   Johnn Hai, PA-C  docusate sodium (COLACE) 100 MG capsule Take 100 mg by mouth daily as needed for mild constipation.    [provider]  gabapentin (NEURONTIN) 100 MG capsule Take 1 capsule at night before bed. May increase to 2 capsules after one week if needed 01/29/19   [provider]  hydrochlorothiazide (HYDRODIURIL) 12.5 MG tablet TAKE 1 TABLET BY MOUTH ONCE DAILY 08/03/19   Karamalegos, Devonne Doughty, DO  ipratropium-albuterol (DUONEB) 0.5-2.5 (3) MG/3ML SOLN Take 3 mLs by nebulization every 6 (six) hours as needed (wheezing, shortness of breath). 04/20/17   [name], NP  metFORMIN (GLUCOPHAGE) 500 MG tablet Take 1,000 mg by mouth 2 (two) times daily.  11/04/18   [provider]  ondansetron (ZOFRAN) 4 MG tablet Take 4 mg by mouth every 8 (eight) hours as needed for nausea or vomiting.    [provider]  pantoprazole (PROTONIX) 40 MG tablet Take 1 tablet (40 mg total) by mouth daily. 11/20/18 11/20/19  [name], NP  sildenafil (REVATIO) 20 MG tablet Take 1-5 pills about 30 min prior to sex. Start with 1 and increase as needed. 08/13/19   Karamalegos, Devonne Doughty, DO  SYMBICORT 80-4.5 MCG/ACT inhaler  INHALE 2 PUFFS BY MOUTH TWICE DAILY 08/17/19   Parks Ranger, Devonne Doughty, DO  tiotropium (SPIRIVA HANDIHALER) 18 MCG inhalation capsule Place 1 capsule (18 mcg total) into inhaler and inhale daily. Patient taking differently: Place 18 mcg into inhaler and inhale 2 (two) times daily as needed (for respiratory issues.).  11/20/18   [name], NP  traZODone (DESYREL) 100 MG tablet TAKE 1 TABLET BY MOUTH AT BEDTIME AS NEEDED SLEEP 08/22/19   Olin Hauser, DO    Allergies Codeine  Family History  Problem Relation Age of Onset  . Breast cancer Mother   . Cancer Father        Black Lung  . Cerebral palsy Brother   . Tuberculosis Paternal Uncle   . Prostate cancer Neg Hx   . Kidney cancer Neg Hx   . Bladder Cancer Neg Hx     Social History Social History   Tobacco Use  . Smoking  status: Current Every Day Smoker    Packs/day: 0.50    Years: 68.00    Pack years: 34.00    Types: Cigarettes  . Smokeless tobacco: Former Systems developer    Types: Chew, Snuff    Quit date: 05/16/2016  Substance Use Topics  . Alcohol use: Not Currently    Alcohol/week: 0.0 - 1.0 standard drinks    Comment: quit 40years ago  . Drug use: No    Review of Systems Constitutional: No fever/chills Eyes: No visual changes. Cardiovascular: Denies chest pain. Respiratory: Denies shortness of breath.  Negative for cough. Gastrointestinal: No abdominal pain.  No nausea, no vomiting.  Musculoskeletal: Positive for right shoulder pain. Skin: Negative for rash. Neurological: Negative for headaches, focal weakness or numbness. ____________________________________________   PHYSICAL EXAM:  VITAL SIGNS: ED Triage Vitals  Enc Vitals Group     BP 10/14/19 0219 140/66     Pulse Rate 10/14/19 0219 84     Resp 10/14/19 0219 18     Temp 10/14/19 0219 97.8 F (36.6 C)     Temp Source 10/14/19 0219 Oral     SpO2 10/14/19 0219 96 %     Weight 10/14/19 0220 230 lb (104.3 kg)     Height 10/14/19 0220 5'  9" (1.753 m)     Head Circumference --      Peak Flow --      Pain Score 10/14/19 0220 10     Pain Loc --      Pain Edu? --      Excl. in [place]? --     Constitutional: Alert and oriented. Well appearing and in no acute distress. Eyes: Conjunctivae are normal.  Head: Atraumatic. Neck: No stridor.   Cardiovascular: Normal rate, regular rhythm. Grossly normal heart sounds.  Good peripheral circulation. Respiratory: Normal respiratory effort.  No retractions. Lungs CTAB. Musculoskeletal: Examination of right shoulder is negative for any acute deformity.  There is tenderness on palpation of the right trapezius muscle and in the [facility] joint.  Range of motion is markedly restricted secondary to patient's tolerance of pain.  No crepitus is appreciated.  No soft tissue edema or discoloration is noted.  Motor sensory function intact distally.  Skin is intact.  Capillary refill is less than 3 seconds. Neurologic:  Normal speech and language. No gross focal neurologic deficits are appreciated. No gait instability. Skin:  Skin is warm, dry and intact. No rash noted. Psychiatric: Mood and affect are normal. Speech and behavior are normal.  ____________________________________________   LABS (all labs ordered are listed, but only abnormal results are displayed)  Labs Reviewed  GLUCOSE, CAPILLARY - Abnormal; Notable for the following components:      Result Value   Glucose-Capillary 228 (*)    All other components within normal limits  CBG MONITORING, ED    RADIOLOGY  Official radiology report(s): DG Shoulder Right  Result Date: 10/14/2019 CLINICAL DATA:  74 year old male with right shoulder pain. No known injury. EXAM: RIGHT SHOULDER - 2+ VIEW COMPARISON:  None. FINDINGS: There is no acute fracture or dislocation. The bones are osteopenic. There is mild degenerative changes of the right shoulder with mild joint space narrowing and spurring. The soft tissues are unremarkable. IMPRESSION: 1. No acute  fracture or dislocation. 2. Mild degenerative changes of the right shoulder. Electronically Signed   By: Anner Crete M.D.   On: 10/14/2019 03:00    ____________________________________________   PROCEDURES  Procedure(s) performed (including Critical Care):  Procedures   ____________________________________________  INITIAL IMPRESSION / ASSESSMENT AND PLAN / ED COURSE  As part of my medical decision making, I reviewed the following data within the electronic MEDICAL RECORD NUMBER Notes from prior ED visits and [place] Controlled Substance Database Nathaniel Little was evaluated in Emergency Department on 10/14/2019 for the symptoms described in the history of present illness. He was evaluated in the context of the global COVID-19 pandemic, which necessitated consideration that the patient might be at risk for infection with the SARS-CoV-2 virus that causes COVID-19. Institutional protocols and algorithms that pertain to the evaluation of patients at risk for COVID-19 are in a state of rapid change based on information released by regulatory bodies including the CDC and federal and state organizations. These policies and algorithms were followed during the patient's care in the ED.  74 year old male presents to the ED with complaint of right shoulder pain for the last 3 weeks.  Patient denies any recent injury or known injuries in the past.  Patient states that range of motion increases his pain.  He has been seen in the past and x-rays of his neck was taken which did not help with his shoulder.  Patient has a history of diabetes and takes oral medication and is also on Plavix.  Patient states that his blood sugars have been running high in the 200s "no matter what I eat".  X-ray findings were discussed with patient.  And going over his medications he does not take Voltaren p.o. and does not know what this medication is.  Due to his diabetes and other health issues it was decided that we would try the  Voltaren gel 4 times a day to his shoulder.  Patient has taken tramadol in the past and states that it did not help.  Patient was given information about the orthopedist on call should he continue to have problems with his shoulder or he can follow-up with his PCP. ____________________________________________   FINAL CLINICAL IMPRESSION(S) / ED DIAGNOSES  Final diagnoses:  Acute pain of right shoulder  Osteoarthritis of right shoulder, unspecified osteoarthritis type     ED Discharge Orders         Ordered    diclofenac Sodium (VOLTAREN) 1 % GEL  4 times daily     10/14/19 0756           Note:  This document was prepared using Dragon voice recognition software and may include unintentional dictation errors.    Johnn Hai, PA-C 10/14/19 1604    Harvest Dark, MD 10/14/19 [phone]

## 2019-10-14 NOTE — ED Notes (Signed)
Patient to stat desk in no acute distress asking about wait time. Patient verbalizes understanding.

## 2019-10-31 ENCOUNTER — Ambulatory Visit: Payer: Medicare Other

## 2019-12-04 ENCOUNTER — Ambulatory Visit (INDEPENDENT_AMBULATORY_CARE_PROVIDER_SITE_OTHER): Payer: Medicare Other

## 2019-12-04 DIAGNOSIS — Z0000 Encounter for general adult medical examination without abnormal findings: Secondary | ICD-10-CM

## 2019-12-04 NOTE — Patient Instructions (Signed)
Nathaniel Little , Thank you for taking time to come for your Medicare Wellness Visit. I appreciate your ongoing commitment to your health goals. Please review the following plan we discussed and let me know if I can assist you in the future.   Screening recommendations/referrals: Colonoscopy: cologuard completed  Recommended yearly ophthalmology/optometry visit for glaucoma screening and checkup Recommended yearly dental visit for hygiene and checkup  Vaccinations: Influenza vaccine: up to date Pneumococcal vaccine: due now  Tdap vaccine: due now  Shingles vaccine: shingrix eligible    Covid-19: first dose completed   Advanced directives: Please bring a copy of your health care power of attorney and living will to the office at your convenience.  Conditions/risks identified: diabetic   Next appointment: Follow up in one year for your annual wellness visit   Preventive Care 24 Years and Older, Male Preventive care refers to lifestyle choices and visits with your health care provider that can promote health and wellness. What does preventive care include?  A yearly physical exam. This is also called an annual well check.  Dental exams once or twice a year.  Routine eye exams. Ask your health care provider how often you should have your eyes checked.  Personal lifestyle choices, including:  Daily care of your teeth and gums.  Regular physical activity.  Eating a healthy diet.  Avoiding tobacco and drug use.  Limiting alcohol use.  Practicing safe sex.  Taking low doses of aspirin every day.  Taking vitamin and mineral supplements as recommended by your health care provider. What happens during an annual well check? The services and screenings done by your health care provider during your annual well check will depend on your age, overall health, lifestyle risk factors, and family history of disease. Counseling  Your health care provider may ask you questions about  your:  Alcohol use.  Tobacco use.  Drug use.  Emotional well-being.  Home and relationship well-being.  Sexual activity.  Eating habits.  History of falls.  Memory and ability to understand (cognition).  Work and work Statistician. Screening  You may have the following tests or measurements:  Height, weight, and BMI.  Blood pressure.  Lipid and cholesterol levels. These may be checked every 5 years, or more frequently if you are over 66 years old.  Skin check.  Lung cancer screening. You may have this screening every year starting at age 68 if you have a 30-pack-year history of smoking and currently smoke or have quit within the past 15 years.  Fecal occult blood test (FOBT) of the stool. You may have this test every year starting at age 32.  Flexible sigmoidoscopy or colonoscopy. You may have a sigmoidoscopy every 5 years or a colonoscopy every 10 years starting at age 42.  Prostate cancer screening. Recommendations will vary depending on your family history and other risks.  Hepatitis C blood test.  Hepatitis B blood test.  Sexually transmitted disease (STD) testing.  Diabetes screening. This is done by checking your blood sugar (glucose) after you have not eaten for a while (fasting). You may have this done every 1-3 years.  Abdominal aortic aneurysm (AAA) screening. You may need this if you are a current or former smoker.  Osteoporosis. You may be screened starting at age 51 if you are at high risk. Talk with your health care provider about your test results, treatment options, and if necessary, the need for more tests. Vaccines  Your health care provider may recommend certain vaccines, such  as:  Influenza vaccine. This is recommended every year.  Tetanus, diphtheria, and acellular pertussis (Tdap, Td) vaccine. You may need a Td booster every 10 years.  Zoster vaccine. You may need this after age 58.  Pneumococcal 13-valent conjugate (PCV13) vaccine.  One dose is recommended after age 48.  Pneumococcal polysaccharide (PPSV23) vaccine. One dose is recommended after age 52. Talk to your health care provider about which screenings and vaccines you need and how often you need them. This information is not intended to replace advice given to you by your health care provider. Make sure you discuss any questions you have with your health care provider. Document Released: 10/17/2015 Document Revised: 06/09/2016 Document Reviewed: 07/22/2015 Elsevier Interactive Patient Education  2017 [place] Prevention in the Home Falls can cause injuries. They can happen to people of all ages. There are many things you can do to make your home safe and to help prevent falls. What can I do on the outside of my home?  Regularly fix the edges of walkways and driveways and fix any cracks.  Remove anything that might make you trip as you walk through a door, such as a raised step or threshold.  Trim any bushes or trees on the path to your home.  Use bright outdoor lighting.  Clear any walking paths of anything that might make someone trip, such as rocks or tools.  Regularly check to see if handrails are loose or broken. Make sure that both sides of any steps have handrails.  Any raised decks and porches should have guardrails on the edges.  Have any leaves, snow, or ice cleared regularly.  Use sand or salt on walking paths during winter.  Clean up any spills in your garage right away. This includes oil or grease spills. What can I do in the bathroom?  Use night lights.  Install grab bars by the toilet and in the tub and shower. Do not use towel bars as grab bars.  Use non-skid mats or decals in the tub or shower.  If you need to sit down in the shower, use a plastic, non-slip stool.  Keep the floor dry. Clean up any water that spills on the floor as soon as it happens.  Remove soap buildup in the tub or shower regularly.  Attach bath  mats securely with double-sided non-slip rug tape.  Do not have throw rugs and other things on the floor that can make you trip. What can I do in the bedroom?  Use night lights.  Make sure that you have a light by your bed that is easy to reach.  Do not use any sheets or blankets that are too big for your bed. They should not hang down onto the floor.  Have a firm chair that has side arms. You can use this for support while you get dressed.  Do not have throw rugs and other things on the floor that can make you trip. What can I do in the kitchen?  Clean up any spills right away.  Avoid walking on wet floors.  Keep items that you use a lot in easy-to-reach places.  If you need to reach something above you, use a strong step stool that has a grab bar.  Keep electrical cords out of the way.  Do not use floor polish or wax that makes floors slippery. If you must use wax, use non-skid floor wax.  Do not have throw rugs and other things on the  floor that can make you trip. What can I do with my stairs?  Do not leave any items on the stairs.  Make sure that there are handrails on both sides of the stairs and use them. Fix handrails that are broken or loose. Make sure that handrails are as long as the stairways.  Check any carpeting to make sure that it is firmly attached to the stairs. Fix any carpet that is loose or worn.  Avoid having throw rugs at the top or bottom of the stairs. If you do have throw rugs, attach them to the floor with carpet tape.  Make sure that you have a light switch at the top of the stairs and the bottom of the stairs. If you do not have them, ask someone to add them for you. What else can I do to help prevent falls?  Wear shoes that:  Do not have high heels.  Have rubber bottoms.  Are comfortable and fit you well.  Are closed at the toe. Do not wear sandals.  If you use a stepladder:  Make sure that it is fully opened. Do not climb a closed  stepladder.  Make sure that both sides of the stepladder are locked into place.  Ask someone to hold it for you, if possible.  Clearly mark and make sure that you can see:  Any grab bars or handrails.  First and last steps.  Where the edge of each step is.  Use tools that help you move around (mobility aids) if they are needed. These include:  Canes.  Walkers.  Scooters.  Crutches.  Turn on the lights when you go into a dark area. Replace any light bulbs as soon as they burn out.  Set up your furniture so you have a clear path. Avoid moving your furniture around.  If any of your floors are uneven, fix them.  If there are any pets around you, be aware of where they are.  Review your medicines with your doctor. Some medicines can make you feel dizzy. This can increase your chance of falling. Ask your doctor what other things that you can do to help prevent falls. This information is not intended to replace advice given to you by your health care provider. Make sure you discuss any questions you have with your health care provider. Document Released: 07/17/2009 Document Revised: 02/26/2016 Document Reviewed: 10/25/2014 Elsevier Interactive Patient Education  2017 [employer].

## 2019-12-04 NOTE — Progress Notes (Signed)
Subjective:   Nathaniel Little is a 74 y.o. male who presents for Medicare Annual/Subsequent preventive examination.  This visit is being conducted via phone call  - after an attmept to do on video chat - due to the COVID-19 pandemic. This patient has given me verbal consent via phone to conduct this visit, patient states they are participating from their home address. Some vital signs may be absent or patient reported.   Patient identification: identified by name, DOB, and current address.    Review of Systems:   Cardiac Risk Factors include: advanced age (>3mn, >>79women);male gender;hypertension;dyslipidemia     Objective:    Vitals: There were no vitals taken for this visit.  There is no height or weight on file to calculate BMI.  Advanced Directives 12/04/2019 10/14/2019 09/19/2019 10/11/2018 09/26/2018 03/29/2018 02/28/2018  Does Patient Have a Medical Advance Directive? Yes No No Yes No No No  Type of Advance Directive Living will;Healthcare Power of Attorney - - - - - -  Does patient want to make changes to medical advance directive? - - - No - Patient declined - - -  Copy of HM[place]erveyin Chart? No - copy requested - - - - - -  Would patient like information on creating a medical advance directive? - No - Patient declined No - Patient declined No - Patient declined No - Patient declined Yes (MAU/Ambulatory/Procedural Areas - Information given) Yes (MAU/Ambulatory/Procedural Areas - Information given)    Tobacco Social History   Tobacco Use  Smoking Status Current Some Day Smoker  . Packs/day: 0.50  . Years: 68.00  . Pack years: 34.00  . Types: Cigarettes  Smokeless Tobacco Former USystems developer . Types: Chew, Snuff  . Quit date: 2000  Tobacco Comment   one cigarette every now and then      Ready to quit: Yes Counseling given: Yes Comment: one cigarette every now and then    Clinical Intake:  Pre-visit preparation completed: Yes  Pain : No/denies pain     Nutritional Risks: None Diabetes: Yes CBG done?: No Did pt. bring in CBG monitor from home?: No  How often do you need to have someone help you when you read instructions, pamphlets, or other written materials from your doctor or pharmacy?: 1 - Never  Interpreter Needed?: No  Information entered by :: Ervey Fallin,LPN  Past Medical History:  Diagnosis Date  . (HFpEF) heart failure with preserved ejection fraction ([place]    a. 08/2017 Echo: EF 60-65%, Gr1 DD.  .Marland KitchenArthritis   . Chest pain    a. 08/2017 Lexiscan MV: EF 53%, no ischemia-->Low risk.  .Marland KitchenCOPD (chronic obstructive pulmonary disease) ([place]   . Depression   . Diabetes mellitus without complication ([place]   . Dyspnea   . Heart murmur   . History of orthopnea   . Hypertension   . Lymphedema   . Moderate aortic stenosis    a. 08/2017 Echo: EF 60-65%, no rwma, GR1 DD, Mod Ao Stenosis, mean grad 219m. Valve area (VTI): 1.04 cm^2. Mildly dil LA.  . Morbid obesity (HC[place]  . Prostate cancer ([place]06/2014   ? PANCREATIC  . Sleep apnea   . Sleep apnea with use of continuous positive airway pressure (CPAP)   . Tobacco abuse   . Tremor    HEAD  . Wheezing    OCCAS   Past Surgical History:  Procedure Laterality Date  . ADENOIDECTOMY    . APPENDECTOMY    .  CATARACT EXTRACTION W/PHACO Left 11/28/2018   Procedure: CATARACT EXTRACTION PHACO AND INTRAOCULAR LENS PLACEMENT (IOC) LEFT, DIABETIC;  Surgeon: Birder Robson, MD;  Location: [facility] ORS;  Service: Ophthalmology;  Laterality: Left;  [name]  00:56 CDE 9.44 Fluid pack lot # [id number]  . EUS N/A 10/13/2017   Procedure: FULL UPPER ENDOSCOPIC ULTRASOUND (EUS) RADIAL;  Surgeon: Holly Bodily, MD;  Location: [facility]OSCOPY;  Service: Gastroenterology;  Laterality: N/A;  . PANCREATICODUODENECTOMY  07/23/2018  . TONSILLECTOMY     Family History  Problem Relation Age of Onset  . Breast cancer Mother   . Cancer Father        Black Lung  . Cerebral palsy Brother   .  Tuberculosis Paternal Uncle   . Prostate cancer Neg Hx   . Kidney cancer Neg Hx   . Bladder Cancer Neg Hx    Social History   Socioeconomic History  . Marital status: Widowed    Spouse name: Not on file  . Number of children: Not on file  . Years of education: Not on file  . Highest education level: Not on file  Occupational History  . Occupation: retired   Tobacco Use  . Smoking status: Current Some Day Smoker    Packs/day: 0.50    Years: 68.00    Pack years: 34.00    Types: Cigarettes  . Smokeless tobacco: Former Systems developer    Types: Chew, [place] date: 2000  . Tobacco comment: one cigarette every now and then   Substance and Sexual Activity  . Alcohol use: Not Currently    Alcohol/week: 0.0 - 1.0 standard drinks    Comment: quit 40years ago  . Drug use: No  . Sexual activity: Not Currently  Other Topics Concern  . Not on file  Social History Narrative   Living at [place] Strain:   . Difficulty of Paying Living Expenses: Not on file  Food Insecurity:   . Worried About Charity fundraiser in the Last Year: Not on file  . Ran Out of Food in the Last Year: Not on file  Transportation Needs:   . Lack of Transportation (Medical): Not on file  . Lack of Transportation (Non-Medical): Not on file  Physical Activity:   . Days of Exercise per Week: Not on file  . Minutes of Exercise per Session: Not on file  Stress:   . Feeling of Stress : Not on file  Social Connections:   . Frequency of Communication with Friends and Family: Not on file  . Frequency of Social Gatherings with Friends and Family: Not on file  . Attends Religious Services: Not on file  . Active Member of Clubs or Organizations: Not on file  . Attends Archivist Meetings: Not on file  . Marital Status: Not on file    Outpatient Encounter Medications as of 12/04/2019  Medication Sig  . ACCU-CHEK AVIVA PLUS test strip   .  albuterol (PROVENTIL) (2.5 MG/3ML) 0.083% nebulizer solution Take 2.5 mg by nebulization every 6 (six) hours as needed for wheezing or shortness of breath.  [name] albuterol (VENTOLIN HFA) 108 (90 Base) MCG/ACT inhaler Inhale 2 puffs into the lungs every 6 (six) hours as needed for wheezing or shortness of breath.  [name] aspirin EC 81 MG tablet Take 81 mg by mouth daily.  . blood glucose meter kit and supplies Dispense based on patient and insurance preference. Use  up to four times daily as directed. (FOR ICD-10 E10.9, E11.9).  . cloNIDine (CATAPRES) 0.1 MG tablet TAKE 1 TABLET BY MOUTH TWICE DAILY  . clopidogrel (PLAVIX) 75 MG tablet TAKE 1 TABLET BY MOUTH ONCE DAILY  . diclofenac (VOLTAREN) 75 MG EC tablet TAKE 1 TABLET BY MOUTH TWICE DAILY. AFTER 14 DAYS TAKE ONLY AS NEEDED  . diclofenac Sodium (VOLTAREN) 1 % GEL Apply 2 g topically 4 (four) times daily.  [name] docusate sodium (COLACE) 100 MG capsule Take 100 mg by mouth daily as needed for mild constipation.  . gabapentin (NEURONTIN) 100 MG capsule Take 1 capsule at night before bed. May increase to 2 capsules after one week if needed  . hydrochlorothiazide (HYDRODIURIL) 12.5 MG tablet TAKE 1 TABLET BY MOUTH ONCE DAILY  . ipratropium-albuterol (DUONEB) 0.5-2.5 (3) MG/3ML SOLN Take 3 mLs by nebulization every 6 (six) hours as needed (wheezing, shortness of breath).  . metFORMIN (GLUCOPHAGE) 500 MG tablet Take 1,000 mg by mouth 2 (two) times daily.   . ondansetron (ZOFRAN) 4 MG tablet Take 4 mg by mouth every 8 (eight) hours as needed for nausea or vomiting.  . pantoprazole (PROTONIX) 40 MG tablet Take 1 tablet (40 mg total) by mouth daily.  . sildenafil (REVATIO) 20 MG tablet Take 1-5 pills about 30 min prior to sex. Start with 1 and increase as needed.  . SYMBICORT 80-4.5 MCG/ACT inhaler INHALE 2 PUFFS BY MOUTH TWICE DAILY  . tiotropium (SPIRIVA HANDIHALER) 18 MCG inhalation capsule Place 1 capsule (18 mcg total) into inhaler and inhale daily. (Patient  taking differently: Place 18 mcg into inhaler and inhale 2 (two) times daily as needed (for respiratory issues.). )  . traZODone (DESYREL) 100 MG tablet TAKE 1 TABLET BY MOUTH AT BEDTIME AS NEEDED SLEEP   No facility-administered encounter medications on file as of 12/04/2019.    Activities of Daily Living In your present state of health, do you have any difficulty performing the following activities: 12/04/2019 06/05/2019  Hearing? N N  Comment no hearing aids -  Vision? N Y  Comment eyeglasses, -  Difficulty concentrating or making decisions? N N  Walking or climbing stairs? N Y  Dressing or bathing? N N  Doing errands, shopping? Tempie Donning  Comment friend helps -  Conservation officer, nature and eating ? N -  Using the Toilet? N -  In the past six months, have you accidently leaked urine? Y -  Comment depends -  Do you have problems with loss of bowel control? N -  Managing your Medications? N -  Managing your Finances? N -  Housekeeping or managing your Housekeeping? N -  Some recent data might be hidden    Patient Care Team: [name], [name], FNP as PCP - General (Family Medicine) Rockey Situ Kathlene November, MD as PCP - Cardiology (Cardiology) Clent Jacks, RN as Registered Nurse Rockey Situ Kathlene November, MD as Consulting Physician (Cardiology)   Assessment:   This is a routine wellness examination for Jeremiyah.  Exercise Activities and Dietary recommendations Current Exercise Habits: Home exercise routine, Type of exercise: walking, Time (Minutes): 30, Frequency (Times/Week): 7, Weekly Exercise (Minutes/Week): 210, Intensity: Mild, Exercise limited by: None identified  Goals Addressed   None     Fall Risk: Fall Risk  12/04/2019 08/13/2019 03/05/2019 01/25/2019 11/23/2018  Falls in the past year? 0 0 0 0 (No Data)  Comment - - - - no falls since last visit  Number falls in past yr: 0 - - - -  Injury with Fall? 0 - - - -  Follow up - - Falls evaluation completed - -    FALL RISK PREVENTION PERTAINING TO  THE HOME:  Any stairs in or around the home? No  If so, are there any without handrails? No   Home free of loose throw rugs in walkways, pet beds, electrical cords, etc? No  Adequate lighting in your home to reduce risk of falls? No   ASSISTIVE DEVICES UTILIZED TO PREVENT FALLS:  Life alert? Yes  Use of a cane, walker or w/c? Yes  walker  Grab bars in the bathroom? Yes  Shower chair or bench in shower? No  Elevated toilet seat or a handicapped toilet? No   TIMED UP AND GO:  Unable to perform   Depression Screen PHQ 2/9 Scores 12/04/2019 08/13/2019 06/05/2019 03/05/2019  PHQ - 2 Score 1 1 0 2  PHQ- 9 Score - 1 8 7     Cognitive Function     6CIT Screen 02/28/2018  What Year? 0 points  What month? 0 points  What time? 0 points  Count back from 20 0 points  Months in reverse 0 points  Repeat phrase 0 points  Total Score 0    Immunization History  Administered Date(s) Administered  . Influenza, High Dose Seasonal PF 08/09/2018  . Influenza,inj,Quad PF,6+ Mos 07/15/2019  . Influenza-Unspecified 07/12/2019  . PFIZER SARS-COV-2 Vaccination 10/29/2019  . Pneumococcal Conjugate-13 12/30/2017    Qualifies for Shingles Vaccine? Yes  Zostavax completed n/a. Due for Shingrix. Education has been provided regarding the importance of this vaccine. Pt has been advised to call insurance company to determine out of pocket expense. Advised may also receive vaccine at local pharmacy or Health Dept. Verbalized acceptance and understanding.  Tdap: due now   Flu Vaccine: up to date   Pneumococcal Vaccine: pneumococcal    Covid-19 Vaccine: completed first dose   Screening Tests Health Maintenance  Topic Date Due  . TETANUS/TDAP  07/24/1964  . PNA vac Low Risk Adult (2 of 2 - PPSV23) 12/31/2018  . Fecal DNA (Cologuard)  05/28/2021  . INFLUENZA VACCINE  Completed  . Hepatitis C Screening  Completed   Cancer Screenings:  Colorectal Screening: Completed cologuard 2019  Lung Cancer  Screening: (Low Dose CT Chest recommended if Age 47-80 years, 30 pack-year currently smoking OR have quit w/in 15years.) does not qualify.   Additional Screening:  Hepatitis C Screening: does qualify; Completed 2019  Vision Screening: Recommended annual ophthalmology exams for early detection of glaucoma and other disorders of the eye. Is the patient up to date with their annual eye exam?  No   Dental Screening: Recommended annual dental exams for proper oral hygiene  Community Resource Referral:  CRR required this visit?  No        Plan:  I have personally reviewed and addressed the Medicare Annual Wellness questionnaire and have noted the following in the patient's chart:  A. Medical and social history B. Use of alcohol, tobacco or illicit drugs  C. Current medications and supplements D. Functional ability and status E.  Nutritional status F.  Physical activity G. Advance directives H. List of other physicians I.  Hospitalizations, surgeries, and ER visits in previous 12 months J.  [place] such as hearing and vision if needed, cognitive and depression L. Referrals and appointments   In addition, I have reviewed and discussed with patient certain preventive protocols, quality metrics, and best practice recommendations. A written personalized care plan  for preventive services as well as general preventive health recommendations were provided to patient.   Signed,   Bevelyn Ngo, LPN  02/08/3461 Nurse Health Advisor   Nurse Notes: Blood sugar has been running in the 300's since getting vaccine. Says he has not changed his diet and is taking his medication as prescribed.   Complaining of right hip/ leg pain. States he knows he has osteoarthritis but the pain is worsening and heating pads, and creams do not help.  appt on 12/06/2019 in office.   Also placed CCM referral to nurse case manager.

## 2019-12-06 ENCOUNTER — Encounter: Payer: Self-pay | Admitting: Family Medicine

## 2019-12-06 ENCOUNTER — Ambulatory Visit: Payer: Medicare Other | Admitting: Nurse Practitioner

## 2019-12-06 ENCOUNTER — Other Ambulatory Visit: Payer: Self-pay

## 2019-12-06 ENCOUNTER — Telehealth: Payer: Self-pay | Admitting: Family Medicine

## 2019-12-06 ENCOUNTER — Ambulatory Visit (INDEPENDENT_AMBULATORY_CARE_PROVIDER_SITE_OTHER): Payer: Medicare Other | Admitting: Family Medicine

## 2019-12-06 VITALS — BP 149/64 | HR 80 | Temp 97.1°F | Resp 20 | Ht 69.0 in | Wt 224.8 lb

## 2019-12-06 DIAGNOSIS — I10 Essential (primary) hypertension: Secondary | ICD-10-CM | POA: Diagnosis not present

## 2019-12-06 DIAGNOSIS — G4730 Sleep apnea, unspecified: Secondary | ICD-10-CM

## 2019-12-06 DIAGNOSIS — E1165 Type 2 diabetes mellitus with hyperglycemia: Secondary | ICD-10-CM | POA: Diagnosis not present

## 2019-12-06 DIAGNOSIS — R350 Frequency of micturition: Secondary | ICD-10-CM

## 2019-12-06 DIAGNOSIS — E119 Type 2 diabetes mellitus without complications: Secondary | ICD-10-CM | POA: Insufficient documentation

## 2019-12-06 DIAGNOSIS — J410 Simple chronic bronchitis: Secondary | ICD-10-CM | POA: Diagnosis not present

## 2019-12-06 DIAGNOSIS — I700 Atherosclerosis of aorta: Secondary | ICD-10-CM

## 2019-12-06 DIAGNOSIS — K219 Gastro-esophageal reflux disease without esophagitis: Secondary | ICD-10-CM

## 2019-12-06 LAB — POCT GLYCOSYLATED HEMOGLOBIN (HGB A1C): Hemoglobin A1C: 9.2 % — AB (ref 4.0–5.6)

## 2019-12-06 MED ORDER — PANTOPRAZOLE SODIUM 40 MG PO TBEC: 40.0000 mg | DELAYED_RELEASE_TABLET | Freq: Two times a day (BID) | ORAL | 11 refills | Status: DC

## 2019-12-06 NOTE — Progress Notes (Signed)
Subjective:    Patient ID: Nathaniel Little, male    DOB: Feb 08, 1946, 74 y.o.   MRN: [id number]  Nathaniel Little is a 74 y.o. male presenting on 12/06/2019 for Hypertension (chronic Right hip pain, diagnose with arthritis. ) and Diabetes (elevated bs averaing in the 300's )   HPI  Nathaniel Little presents to clinic for a follow up on his hypertension, diabetes, GERD, and COPD.  States he has been having elevated glucose readings in the 300's since he had his COVID vaccine 1 week ago.  Has been having an increase in coughing around 6pm with mucus production.  Denies any shortness of breath, CP, visual changes, dizziness, lightheadedness, abdominal pain, n/v/d.  Depression screen [facility] 2/9 12/04/2019 08/13/2019 06/05/2019  Decreased Interest 0 0 0  Down, Depressed, Hopeless 1 1 0  PHQ - 2 Score 1 1 0  Altered sleeping - 0 3  Tired, decreased energy - 0 2  Change in appetite - 0 3  Feeling bad or failure about yourself  - 0 0  Trouble concentrating - 0 0  Moving slowly or fidgety/restless - 0 0  Suicidal thoughts - 0 0  PHQ-9 Score - 1 8  Difficult doing work/chores - Not difficult at all Not difficult at all    Social History   Tobacco Use  . Smoking status: Current Some Day Smoker    Packs/day: 0.25    Years: 68.00    Pack years: 17.00    Types: Cigarettes  . Smokeless tobacco: Former Systems developer    Types: Chew, [place] date: 2000  . Tobacco comment: one cigarette every now and then   Substance Use Topics  . Alcohol use: Not Currently    Alcohol/week: 0.0 - 1.0 standard drinks    Comment: quit 40years ago  . Drug use: No    Review of Systems  Constitutional: Negative.   HENT: Negative.   Eyes: Negative.   Respiratory: Positive for cough. Negative for apnea, choking, chest tightness, shortness of breath, wheezing and stridor.   Cardiovascular: Negative.   Gastrointestinal: Negative.   Genitourinary: Positive for frequency. Negative for decreased urine volume, difficulty urinating,  discharge, dysuria, enuresis, flank pain, genital sores, hematuria, penile pain, penile swelling, scrotal swelling, testicular pain and urgency.  Musculoskeletal: Negative.   Skin: Negative.   Allergic/Immunologic: Negative.   Neurological: Negative.   Hematological: Negative.   Psychiatric/Behavioral: Negative.    Per HPI unless specifically indicated above     Objective:    BP (!) 149/64 (BP Location: Right Arm, Patient Position: Sitting, Cuff Size: Normal)   Pulse 80   Temp (!) 97.1 F (36.2 C) (Oral)   Resp 20   Ht 5\' 9"  (1.753 m)   Wt 224 lb 12.8 oz (102 kg)   SpO2 98%   BMI 33.20 kg/m   Wt Readings from Last 3 Encounters:  12/06/19 224 lb 12.8 oz (102 kg)  10/14/19 230 lb (104.3 kg)  09/24/19 227 lb (103 kg)    Physical Exam Vitals reviewed.  Constitutional:      General: He is not in acute distress.    Appearance: Normal appearance. He is well-groomed and normal weight. He is not ill-appearing or toxic-appearing.  HENT:     Head: Normocephalic.     Right Ear: Tympanic membrane, ear canal and external ear normal. There is no impacted cerumen.     Left Ear: Tympanic membrane, ear canal and external ear normal. There is no impacted cerumen.  Nose: Nose normal. No congestion or rhinorrhea.     Mouth/Throat:     Mouth: Mucous membranes are moist.     Pharynx: Oropharynx is clear. No oropharyngeal exudate or posterior oropharyngeal erythema.  Eyes:     General: Lids are normal. Vision grossly intact. No scleral icterus.       Right eye: No discharge or hordeolum.        Left eye: No discharge or hordeolum.     Extraocular Movements: Extraocular movements intact.     Conjunctiva/sclera: Conjunctivae normal.     Pupils: Pupils are equal, round, and reactive to light.  Neck:     Thyroid: No thyroid mass, thyromegaly or thyroid tenderness.  Cardiovascular:     Rate and Rhythm: Normal rate and regular rhythm.     Pulses: Normal pulses.          Dorsalis pedis  pulses are 2+ on the right side and 2+ on the left side.       Posterior tibial pulses are 2+ on the right side and 2+ on the left side.     Heart sounds: Normal heart sounds. No murmur. No friction rub. No gallop.   Pulmonary:     Effort: Pulmonary effort is normal. No respiratory distress.     Breath sounds: Normal breath sounds.  Abdominal:     General: Abdomen is flat. Bowel sounds are normal. There is no distension or abdominal bruit.     Palpations: Abdomen is soft. There is no hepatomegaly, splenomegaly or mass.     Tenderness: There is no abdominal tenderness. There is no guarding or rebound.     Hernia: No hernia is present.  Musculoskeletal:        General: Normal range of motion.     Cervical back: Normal range of motion and neck supple. No tenderness.     Right lower leg: No edema.     Left lower leg: No edema.  Feet:     Right foot:     Skin integrity: Skin integrity normal.     Left foot:     Skin integrity: Skin integrity normal.  Lymphadenopathy:     Cervical: No cervical adenopathy.  Skin:    General: Skin is warm and dry.     Capillary Refill: Capillary refill takes less than 2 seconds.  Neurological:     General: No focal deficit present.     Mental Status: He is alert and oriented to person, place, and time.     Cranial Nerves: Cranial nerves are intact.     Sensory: Sensation is intact.     Motor: Motor function is intact.     Coordination: Coordination is intact.     Gait: Gait is intact.     Deep Tendon Reflexes: Reflexes normal.  Psychiatric:        Attention and Perception: Attention and perception normal.        Mood and Affect: Mood and affect normal.        Speech: Speech normal.        Behavior: Behavior normal. Behavior is cooperative.        Thought Content: Thought content normal.        Cognition and Memory: Cognition and memory normal.        Judgment: Judgment normal.     Results for orders placed or performed in visit on 12/06/19    POCT glycosylated hemoglobin (Hb A1C)  Result Value Ref Range   Hemoglobin A1C  9.2 (A) 4.0 - 5.6 %   HbA1c POC (<> result, manual entry)     HbA1c, POC (prediabetic range)     HbA1c, POC (controlled diabetic range)        Assessment & Plan:   Problem List Items Addressed This Visit      Cardiovascular and Mediastinum   Essential hypertension - Primary    Presently uncontrolled hypertension today during appointment with BP above goal of 130/80.  Patient believes is related to having COVID vaccine last week and not feeling the greatest.  Has had a 6lb weight loss over the past 2 months.  Has been taking his clonidine 0.1mg  tablet twice daily and hydrochlorothiazide 12.5mg  daily, tolerating well without known side effects.  Plan: 1) Labs drawn today 2) Continue clonidine 0.1mg  tablet twice a day and hydrochlorothiazide 12.5mg  daily  3) Take blood pressure readings regularly and write in a log.  Bring that log to your next appointment.  If you consistently have readings over 130/80 to call us sooner than your next appointment. 4) Heart healthy diet and to exercise every other day for 30 minutes per day, going no more than 2 days in a row without exercise. 5) We will see you back in 6 months      Relevant Orders   CBC with Differential   COMPLETE METABOLIC PANEL WITH GFR   Lipid Profile   Ambulatory referral to Chronic Care Management Services   Aortic atherosclerosis ([place])     Respiratory   Sleep apnea with use of continuous positive airway pressure (CPAP)    Reports using his CPAP nightly with good effect.  No report from CPAP machine for review.  Will follow up again in 6 months.      Chronic obstructive pulmonary disease (HCC)    Stable and is fairly well controlled without requiring any use of Albuterol.  Lung sounds good on auscultation, has resumed smoking, and smoking cessation discussed.  Plan: 1) Continue Symbicort and Spiriva 99mcg once daily 2) Continue Albuterol  rescue inhaler as needed. 3) Discussed smoking cessation 4) Pulmonology referral placed due to increased mucus production with cough nightly and resumption of smoking status 5) Will follow up in 6 months, sooner if needed      Relevant Orders   Ambulatory referral to Pulmonology   Ambulatory referral to Chronic Care Management Services     Digestive   GERD (gastroesophageal reflux disease)    Increased GERD at the end of the day, will increase Protonix from 40mg  daily to 40mg  BID.    Labs drawn today Will see him back in 6 months for re-evaluation      Relevant Medications   pantoprazole (PROTONIX) 40 MG tablet     Other   Increased frequency of urination    Follows with Dr. Bernardo Heater for Prostate Cancer and has not had his PSA checked since December.  Declines UA or urine culture.  States is urinating many more times than usual throughout the day.  Declined DRE.  Sending PSA and will contact Dr. Dene Gentry office for an update so they can schedule to see him sooner and will forward the results once received.      Relevant Orders   Ambulatory referral to Chronic Care Management Services    Other Visit Diagnoses    Uncontrolled type 2 diabetes mellitus with hyperglycemia ([place])       Relevant Orders   POCT glycosylated hemoglobin (Hb A1C) (Completed)   CBC with Differential   COMPLETE  METABOLIC PANEL WITH GFR   Lipid Profile   Ambulatory referral to Chronic Care Management Services   Urinary frequency       Relevant Orders   PSA      Meds ordered this encounter  Medications  . pantoprazole (PROTONIX) 40 MG tablet    Sig: Take 1 tablet (40 mg total) by mouth 2 (two) times daily.    Dispense:  60 tablet    Refill:  11      Follow up plan: Return in about 6 months (around 06/07/2020) for DM, HTN, COPD f/u & A1C. Schedule appointment with Pulmonology once they contact you  Contact [facility] and schedule follow up appointment for your diabetes  Contact Dr.  Dene Gentry office and schedule an appointment for re-evaluation  Harlin Rain, [facility] Nurse Practitioner [facility] 12/06/2019, 2:53 PM

## 2019-12-06 NOTE — Telephone Encounter (Signed)
Telephone call to Dr. Georgiana Shore Blackwood's office with [facility].  Spoke with front desk, message sent to Dr. Ruthy Dick RN regarding [name] A1C in clinic today and that I did not adjust or add any new medications as they are managing him.  Provided my contact information if any additional information was needed.

## 2019-12-06 NOTE — Assessment & Plan Note (Signed)
Reports using his CPAP nightly with good effect.  No report from CPAP machine for review.  Will follow up again in 6 months.

## 2019-12-06 NOTE — Assessment & Plan Note (Signed)
Presently uncontrolled hypertension today during appointment with BP above goal of 130/80.  Patient believes is related to having COVID vaccine last week and not feeling the greatest.  Has had a 6lb weight loss over the past 2 months.  Has been taking his clonidine 0.1mg  tablet twice daily and hydrochlorothiazide 12.5mg  daily, tolerating well without known side effects.  Plan: 1) Labs drawn today 2) Continue clonidine 0.1mg  tablet twice a day and hydrochlorothiazide 12.5mg  daily  3) Take blood pressure readings regularly and write in a log.  Bring that log to your next appointment.  If you consistently have readings over 130/80 to call us sooner than your next appointment. 4) Heart healthy diet and to exercise every other day for 30 minutes per day, going no more than 2 days in a row without exercise. 5) We will see you back in 6 months

## 2019-12-06 NOTE — Patient Instructions (Signed)
We have sent your blood work to the lab for processing and I will reach out to Dr. Dene Gentry office as well as Dr. Ruthy Dick office for your Prostate and DM to have you scheduled with your specialists for these issues.  We will contact you once we receive your lab results.  Try to get exercise a minimum of 30 minutes per day at least 5 days per week as well as  adequate water intake all while measuring blood pressure a few times per week.  Keep a blood pressure log and bring back to clinic at your next visit.  If your readings are consistently over 140/90 to contact our office/send me a MyChart message and we will see you sooner.  Can try DASH and Mediterranean diet options, avoiding processed foods, lowering sodium intake, avoiding pork products, and eating a plant based diet for optimal health.  Reminded to have yearly dilated eye exams, daily feet check, and following a heart healthy diet (low fat, low salt, low carb and protein control) along with daily exercise.  Eat a plant based diet for optimal health.  Advised to avoid juices, sodas, rice, pasta, potatoes and bread and to get at least 30 minutes of exercise 5x a week.    We will plan to see you back in 6 months for re-evaluation of your hypertension & COPD.  You will receive a survey after today's visit either digitally by e-mail or paper by [name]. Your experiences and feedback matter to [name].  Please respond so we know how we are doing as we provide care for you.  Call us with any questions/concerns/needs.  It is my goal to be available to you for your health concerns.  Thanks for choosing me to be a partner in your healthcare needs!  Harlin Rain, FNP-C Family Nurse Practitioner [facility] Phone: [phone]

## 2019-12-06 NOTE — Assessment & Plan Note (Signed)
Increased GERD at the end of the day, will increase Protonix from 40mg  daily to 40mg  BID.    Labs drawn today Will see him back in 6 months for re-evaluation

## 2019-12-06 NOTE — Assessment & Plan Note (Signed)
Follows with Dr. Bernardo Heater for Prostate Cancer and has not had his PSA checked since December.  Declines UA or urine culture.  States is urinating many more times than usual throughout the day.  Declined DRE.  Sending PSA and will contact Dr. Dene Gentry office for an update so they can schedule to see him sooner and will forward the results once received.

## 2019-12-06 NOTE — Assessment & Plan Note (Signed)
Stable and is fairly well controlled without requiring any use of Albuterol.  Lung sounds good on auscultation, has resumed smoking, and smoking cessation discussed.  Plan: 1) Continue Symbicort and Spiriva 51mcg once daily 2) Continue Albuterol rescue inhaler as needed. 3) Discussed smoking cessation 4) Pulmonology referral placed due to increased mucus production with cough nightly and resumption of smoking status 5) Will follow up in 6 months, sooner if needed

## 2019-12-07 ENCOUNTER — Other Ambulatory Visit: Payer: Self-pay | Admitting: Family Medicine

## 2019-12-07 ENCOUNTER — Telehealth: Payer: Self-pay | Admitting: Family Medicine

## 2019-12-07 DIAGNOSIS — M25551 Pain in right hip: Secondary | ICD-10-CM

## 2019-12-07 LAB — COMPLETE METABOLIC PANEL WITH GFR
AG Ratio: 1.6 (calc) (ref 1.0–2.5)
ALT: 48 U/L — ABNORMAL HIGH (ref 9–46)
AST: 38 U/L — ABNORMAL HIGH (ref 10–35)
Albumin: 3.9 g/dL (ref 3.6–5.1)
Alkaline phosphatase (APISO): 129 U/L (ref 35–144)
BUN: 18 mg/dL (ref 7–25)
CO2: 21 mmol/L (ref 20–32)
Calcium: 9.4 mg/dL (ref 8.6–10.3)
Chloride: 106 mmol/L (ref 98–110)
Creat: 1.05 mg/dL (ref 0.70–1.18)
GFR, Est African American: 81 mL/min/{1.73_m2} (ref >=60)
GFR, Est Non African American: 70 mL/min/{1.73_m2} (ref >=60)
Globulin: 2.5 g/dL (calc) (ref 1.9–3.7)
Glucose, Bld: 278 mg/dL — ABNORMAL HIGH (ref 65–99)
Potassium: 4.2 mmol/L (ref 3.5–5.3)
Sodium: 138 mmol/L (ref 135–146)
Total Bilirubin: 0.4 mg/dL (ref 0.2–1.2)
Total Protein: 6.4 g/dL (ref 6.1–8.1)

## 2019-12-07 LAB — LIPID PANEL
Cholesterol: 138 mg/dL (ref <200)
HDL: 38 mg/dL — ABNORMAL LOW (ref >=40)
LDL Cholesterol (Calc): 82 mg/dL (calc)
Non-HDL Cholesterol (Calc): 100 mg/dL (calc) (ref <130)
Total CHOL/HDL Ratio: 3.6 (calc) (ref <5.0)
Triglycerides: 86 mg/dL (ref <150)

## 2019-12-07 LAB — PSA: PSA: 3.5 ng/mL (ref <=4.0)

## 2019-12-07 LAB — CBC WITH DIFFERENTIAL/PLATELET

## 2019-12-07 NOTE — Chronic Care Management (AMB) (Signed)
  Chronic Care Management   Outreach Note  12/07/2019 Name: Nathaniel Little MRN: [id number] DOB: Aug 14, 1946  Nathaniel Little is a 74 y.o. year old male who is a primary care patient of Lorine Bears, [name], FNP. I reached out to Nathaniel Little by phone today in response to a referral sent by Nathaniel Little's PCP, Cyndia Skeeters FNP     An unsuccessful telephone outreach was attempted today. The patient was referred to the case management team for assistance with care management and care coordination.   Follow Up Plan: A HIPPA compliant phone message was left for the patient providing contact information and requesting a return call.  The care management team will reach out to the patient again over the next 7 days.  If patient returns call to provider office, please advise to call Embedded Care Management Care Guide Glenna Durand LPN at [phone]  Bram Hottel, LPN Health Advisor, [place] [facility] ??[email or web address] ??[phone]

## 2019-12-14 NOTE — Chronic Care Management (AMB) (Signed)
  Chronic Care Management   Note  12/14/2019 Name: Nathaniel Little MRN: [id number] DOB: 07/07/1946  Kenric Ginger is a 74 y.o. year old male who is a primary care patient of Lorine Bears, [name], FNP. I reached out to [name] by phone today in response to a referral sent by Mr. Armand Ruggirello's PCP, Cyndia Skeeters FNP     Mr. Catena was given information about Chronic Care Management services today including:  1. CCM service includes personalized support from designated clinical staff supervised by his physician, including individualized plan of care and coordination with other care providers 2. 24/7 contact phone numbers for assistance for urgent and routine care needs. 3. Service will only be billed when office clinical staff spend 20 minutes or more in a month to coordinate care. 4. Only one practitioner may furnish and bill the service in a calendar month. 5. The patient may stop CCM services at any time (effective at the end of the month) by phone call to the office staff. 6. The patient will be responsible for cost sharing (co-pay) of up to 20% of the service fee (after annual deductible is met).  Patient did not agree to enrollment in care management services and does not wish to consider at this time.  Follow up plan: The care management team is available to follow up with the patient after provider conversation with the patient regarding recommendation for care management engagement and subsequent re-referral to the care management team.   Glenna Durand, LPN Health Advisor, [place] Management ??[email or web address] ??[phone]

## 2020-01-01 ENCOUNTER — Ambulatory Visit: Payer: Self-pay

## 2020-01-01 NOTE — Chronic Care Management (AMB) (Signed)
  Care Management   Follow Up Note   01/01/2020 Name: Nathaniel Little MRN: [id number] DOB: June 23, 1946  Referred by: Verl Bangs, FNP Reason for referral : [place] is a 74 y.o. year old male who is a primary care patient of Lorine Bears, [name], FNP. The care management team was consulted for assistance with care management and care coordination needs.    Review of patient status, including review of consultants reports, relevant laboratory and other test results, and collaboration with appropriate care team members and the patient's provider was performed as part of comprehensive patient evaluation and provision of chronic care management services.    LCSW completed CCM outreach attempt today but was unable to reach patient successfully. A HIPPA compliant voice message was left encouraging patient to return call once available. LCSW rescheduled CCM SW appointment as well.  A HIPPA compliant phone message was left for the patient providing contact information and requesting a return call.   Eula Fried, BSW, MSW, [email or web address] Phone: [phone]

## 2020-01-03 ENCOUNTER — Ambulatory Visit (INDEPENDENT_AMBULATORY_CARE_PROVIDER_SITE_OTHER): Payer: Medicare Other | Admitting: Licensed Clinical Social Worker

## 2020-01-03 DIAGNOSIS — J449 Chronic obstructive pulmonary disease, unspecified: Secondary | ICD-10-CM

## 2020-01-03 DIAGNOSIS — F4310 Post-traumatic stress disorder, unspecified: Secondary | ICD-10-CM

## 2020-01-03 DIAGNOSIS — F324 Major depressive disorder, single episode, in partial remission: Secondary | ICD-10-CM

## 2020-01-03 DIAGNOSIS — I10 Essential (primary) hypertension: Secondary | ICD-10-CM

## 2020-01-03 DIAGNOSIS — E1165 Type 2 diabetes mellitus with hyperglycemia: Secondary | ICD-10-CM | POA: Diagnosis not present

## 2020-01-03 NOTE — Chronic Care Management (AMB) (Signed)
Chronic Care Management    Clinical Social Work General Note  01/03/2020 Name: Nathaniel Little MRN: [id number] DOB: December 27, 1945  Nathaniel Little is a 74 y.o. year old male who is a primary care patient of Nathaniel Little, Nathaniel Raider, FNP. The CCM was consulted to assist the patient.  Nathaniel Little was given information about Chronic Care Management services today including:  1. CCM service includes personalized support from designated clinical staff supervised by his physician, including individualized plan of care and coordination with other care providers 2. 24/7 contact phone numbers for assistance for urgent and routine care needs. 3. Service will only be billed when office clinical staff spend 20 minutes or more in a month to coordinate care. 4. Only one practitioner may furnish and bill the service in a calendar month. 5. The patient may stop CCM services at any time (effective at the end of the month) by phone call to the office staff. 6. The patient will be responsible for cost sharing (co-pay) of up to 20% of the service fee (after annual deductible is met).  Patient agreed to services and verbal consent obtained.   Review of patient status, including review of consultants reports, relevant laboratory and other test results, and collaboration with appropriate care team members and the patient's provider was performed as part of comprehensive patient evaluation and provision of chronic care management services.    SDOH (Social Determinants of Health) assessments and interventions performed:  Yes    Outpatient Encounter Medications as of 01/03/2020  Medication Sig Note  . ACCU-CHEK AVIVA PLUS test strip    . albuterol (PROVENTIL) (2.5 MG/3ML) 0.083% nebulizer solution Take 2.5 mg by nebulization every 6 (six) hours as needed for wheezing or shortness of breath. 11/22/2018: Patient has on hand to use but has not started  . albuterol (VENTOLIN HFA) 108 (90 Base) MCG/ACT inhaler Inhale 2 puffs into the lungs  every 6 (six) hours as needed for wheezing or shortness of breath.   [name] aspirin EC 81 MG tablet Take 81 mg by mouth daily. 11/22/2018: On hold per patient needs to purchase more  . blood glucose meter kit and supplies Dispense based on patient and insurance preference. Use up to four times daily as directed. (FOR ICD-10 E10.9, E11.9).   . cloNIDine (CATAPRES) 0.1 MG tablet TAKE 1 TABLET BY MOUTH TWICE DAILY   . clopidogrel (PLAVIX) 75 MG tablet TAKE 1 TABLET BY MOUTH ONCE DAILY   . diclofenac (VOLTAREN) 75 MG EC tablet TAKE 1 TABLET BY MOUTH TWICE DAILY. AFTER 14 DAYS TAKE ONLY AS NEEDED   . diclofenac Sodium (VOLTAREN) 1 % GEL Apply 2 g topically 4 (four) times daily.   [name] docusate sodium (COLACE) 100 MG capsule Take 100 mg by mouth daily as needed for mild constipation.   . gabapentin (NEURONTIN) 100 MG capsule Take 1 capsule at night before bed. May increase to 2 capsules after one week if needed   . hydrochlorothiazide (HYDRODIURIL) 12.5 MG tablet TAKE 1 TABLET BY MOUTH ONCE DAILY   . ipratropium-albuterol (DUONEB) 0.5-2.5 (3) MG/3ML SOLN Take 3 mLs by nebulization every 6 (six) hours as needed (wheezing, shortness of breath).   . metFORMIN (GLUCOPHAGE) 500 MG tablet Take 1,000 mg by mouth 2 (two) times daily.    . ondansetron (ZOFRAN) 4 MG tablet Take 4 mg by mouth every 8 (eight) hours as needed for nausea or vomiting.   . pantoprazole (PROTONIX) 40 MG tablet Take 1 tablet (40 mg total) by mouth 2 (two)  times daily.   . sildenafil (REVATIO) 20 MG tablet Take 1-5 pills about 30 min prior to sex. Start with 1 and increase as needed.   . SYMBICORT 80-4.5 MCG/ACT inhaler INHALE 2 PUFFS BY MOUTH TWICE DAILY   . tiotropium (SPIRIVA HANDIHALER) 18 MCG inhalation capsule Place 1 capsule (18 mcg total) into inhaler and inhale daily. (Patient taking differently: Place 18 mcg into inhaler and inhale 2 (two) times daily as needed (for respiratory issues.). )   . traZODone (DESYREL) 100 MG tablet TAKE 1  TABLET BY MOUTH AT BEDTIME AS NEEDED SLEEP    No facility-administered encounter medications on file as of 01/03/2020.    Goals Addressed    . SW: I need some extra support right now (pt-stated)       CARE PLAN ENTRY (see longtitudinal plan of care for additional care plan information)  Current Barriers:  . Financial constraints related to managing health care expenses and affording needed DME . Limited social support . ADL IADL limitations . Social Isolation . Limited access to caregiver . Inability to perform ADL's independently . Inability to perform IADL's independently  Clinical Social Work Clinical Goal(s):  [name] Over the next 120 days, patient will work with SW to address concerns related to gaining additional support/resource connection in order to maintain health . Over the next 120 days, patient will demonstrate improved adherence to self care as evidenced by implementing healthy self-care into his daily routine such as: attending all medical appointments, taking time for self-reflection, taking medications as prescribed, drinking water and daily exercise to improve mobility.  Interventions: . Patient interviewed and appropriate assessments performed. Referral received from PCP-He has COPD and is well controlled without exacerbation but recently went back to smoking and now has daily coughing with increased mucus production nightly around 6pm.  I am hoping to have help with reaching out to these specialists offices and making sure he can get to them.  He has a rolling walker and utilizes mass transit for transportation.  . Provided patient with information about available transportation benefits within his insurance. Patient use [employer] [employer] in order to get to his medical appointments. However, they will not drive over 25 mins from his residence. Patient reports that he will have a follow up cancer appointment in [place] at [place] next year and will need  transportation assistance for this arrangement. LCSW provided education on [facility] transportation and patient confirms that he has used this service in the past and will do so again when needed transportation arrangements out of [employer]. . Discussed plans with patient for ongoing care management follow up and provided patient with direct contact information for care management team . Education provided in regards improving self-care. Patient admits that he has started back smoking cigarettes after 4 months of discontinuing them. Patient reports smoking 3 cigarettes per day. Advised patient to implement appropriate self-care tools into his daily routine to improve overall health care.  Nash Dimmer with RN Case Manager and CCM Pharmacist re: referral for CCM program . Patient reports that his mouth is very dry from using his CPAP machine at night. He reports that this has caused him some complications and irritation of the skin.  . Assisted patient/caregiver with obtaining information about health plan benefits . Patient shares that he was put on new medication injection that he uses every night to help keep his blood sugars low. He reports that he has only been on this medication for a week and  has already seen positive outcomes. . Provided education and assistance to client regarding Advanced Directives.  Patient Self Care Activities:  . Self administers medications as prescribed . Attends all scheduled provider appointments . Calls provider office for new concerns or questions . Lacks social connections  Initial goal documentation     Follow Up Plan: SW will follow up with patient by phone over the next quarter      Eula Fried, [place], MSW, [email or web address] Phone: [phone]

## 2020-01-04 ENCOUNTER — Other Ambulatory Visit: Payer: Self-pay | Admitting: Family Medicine

## 2020-01-04 DIAGNOSIS — I10 Essential (primary) hypertension: Secondary | ICD-10-CM

## 2020-01-08 ENCOUNTER — Other Ambulatory Visit: Payer: Self-pay | Admitting: Family Medicine

## 2020-01-08 DIAGNOSIS — M25551 Pain in right hip: Secondary | ICD-10-CM

## 2020-01-14 ENCOUNTER — Ambulatory Visit: Payer: Self-pay | Admitting: General Practice

## 2020-01-14 ENCOUNTER — Telehealth: Payer: Self-pay

## 2020-01-14 NOTE — Chronic Care Management (AMB) (Signed)
  Chronic Care Management   Outreach Note  01/14/2020 Name: Obrien Huskins MRN: [id number] DOB: Apr 27, 1946  Referred by: Verl Bangs, FNP Reason for referral : Chronic Care Management (Initial: Chronic Disease Management and Care coordination needs )   An unsuccessful telephone outreach was attempted today. The patient was referred to the case management team for assistance with care management and care coordination.   Follow Up Plan: A HIPPA compliant phone message was left for the patient providing contact information and requesting a return call.   Noreene Larsson RN, MSN, [place] Mobile: [phone]

## 2020-01-23 ENCOUNTER — Telehealth: Payer: Self-pay | Admitting: Family Medicine

## 2020-01-23 NOTE — Telephone Encounter (Signed)
Patient was to make an appt with Dr. Bernardo Heater for follow up on his prostate cancer.  At last visit 3/4 he wanted his PSA checked and then to f/u with Dr. Bernardo Heater afterwards.  I don't see an appt in the computer with Dr. Bernardo Heater and tried to contact him today without an answer.    Can you call and see if this appt has been made?    Thanks

## 2020-01-23 NOTE — Telephone Encounter (Signed)
Attempted to contact the patient, no answer. LMOM

## 2020-01-24 ENCOUNTER — Ambulatory Visit: Payer: Self-pay | Admitting: General Practice

## 2020-01-24 ENCOUNTER — Telehealth: Payer: Self-pay

## 2020-01-24 NOTE — Chronic Care Management (AMB) (Signed)
  Chronic Care Management   Outreach Note  01/24/2020 Name: Nathaniel Little MRN: [id number] DOB: June 16, 1946  Referred by: Verl Bangs, FNP Reason for referral : Chronic Care Management (2nd attempt for Initial outreach for [facility] chronic disease management and care coordination needs)   A second unsuccessful telephone outreach was attempted today. The patient was referred to the case management team for assistance with care management and care coordination.   Follow Up Plan: A HIPPA compliant phone message was left for the patient providing contact information and requesting a return call.   Noreene Larsson RN, MSN, [place] Mobile: [phone]

## 2020-01-29 ENCOUNTER — Encounter: Payer: Self-pay | Admitting: Pulmonary Disease

## 2020-01-29 ENCOUNTER — Other Ambulatory Visit: Payer: Self-pay

## 2020-01-29 ENCOUNTER — Ambulatory Visit (INDEPENDENT_AMBULATORY_CARE_PROVIDER_SITE_OTHER): Payer: Medicare Other | Admitting: Pulmonary Disease

## 2020-01-29 VITALS — BP 122/64 | HR 82 | Ht 69.0 in | Wt 221.0 lb

## 2020-01-29 DIAGNOSIS — I350 Nonrheumatic aortic (valve) stenosis: Secondary | ICD-10-CM | POA: Diagnosis not present

## 2020-01-29 DIAGNOSIS — E65 Localized adiposity: Secondary | ICD-10-CM

## 2020-01-29 DIAGNOSIS — R0602 Shortness of breath: Secondary | ICD-10-CM | POA: Diagnosis not present

## 2020-01-29 NOTE — Progress Notes (Signed)
Subjective:    Patient ID: Nathaniel Little, male    DOB: [birth year], 74 y.o.   MRN: [id number]  HPI This is a 74 year old remote former smoker (quit 2000) who presents for evaluation of increasing dyspnea and occasional increasing abdominal girth.  Patient is kindly referred by Cyndia Skeeters FNP has noted worsening symptoms over the last several months.  He states that he has been diagnosed with COPD and is on inhalers.  His current medications for COPD include Symbicort 80/4.52 inhalations twice a day, Spiriva 1 capsule inhaled daily and as needed albuterol.  He does not describe any chest pain.  He does have occasional cough productive of whitish sputum.  He feels that sometimes his shortness of breath comes from inability to take a deep breath due to protuberant abdomen.  He notes that occasionally his abdomen gets "swollen".  He does not have any wheezing, fevers, chills or sweats.  He does describe several episodes of paroxysmal nocturnal dyspnea over the last several weeks.  He has not had orthopnea per se.  No lower extremity edema.  He does note that his inhalers help him but that his dyspnea is not associated to the symptoms are relieved with his inhaler.  Patient is current on COVID-19 vaccination.  Past medical history, surgical history, family history have been reviewed, they are as noted.   Review of Systems A 10 point review of systems was performed and it is as noted above otherwise negative. Past Medical History:  Diagnosis Date  . (HFpEF) heart failure with preserved ejection fraction ([place])    a. 08/2017 Echo: EF 60-65%, Gr1 DD.  [name] Arthritis   . COPD (chronic obstructive pulmonary disease) ([place])   . Coronary artery disease 2021  . Depression   . Diabetes mellitus without complication ([place])   . Hypertension   . Lymphedema   . Morbid obesity ([place])   . Prostate cancer ([place]) 03/2013   ? PANCREATIC  . Sleep apnea with use of continuous positive airway pressure (CPAP)   .  Tobacco abuse   . Tremor    HEAD   Past surgical history reviewed.  Family History  Problem Relation Age of Onset  . Breast cancer Mother   . Cancer Father        Black Lung  . Cerebral palsy Brother   . Tuberculosis Paternal Uncle   . Prostate cancer Neg Hx   . Kidney cancer Neg Hx   . Bladder Cancer Neg Hx    Social History   Tobacco Use  . Smoking status: Former Smoker    Packs/day: 4.00    Years: 68.00    Pack years: 272.00    Types: Cigarettes  . Smokeless tobacco: Former Systems developer    Types: Chew, [place] date: 2000  . Tobacco comment:  Substance Use Topics  . Alcohol use: Not Currently    Alcohol/week: 0.0 - 1.0 standard drinks    Comment: quit 40years ago   Immunization History  Administered Date(s) Administered  . Influenza, High Dose Seasonal PF 08/09/2018  . Influenza,inj,Quad PF,6+ Mos 07/15/2019  . Influenza-Unspecified 07/12/2019  . PFIZER SARS-COV-2 Vaccination 10/29/2019  . Pneumococcal Conjugate-13 12/30/2017   Current Outpatient Medications  Medication Instructions  . ACCU-CHEK AVIVA PLUS test strip No dose, route, or frequency recorded.  [name] albuterol (PROVENTIL) 2.5 mg, Nebulization, Every 6 hours PRN  . albuterol (VENTOLIN HFA) 108 (90 Base) MCG/ACT inhaler 2 puffs, Inhalation, Every 6 hours PRN  . aspirin EC 81  mg, Oral, Daily  . blood glucose meter kit and supplies Dispense based on patient and insurance preference. Use up to four times daily as directed. (FOR ICD-10 E10.9, E11.9).  . cloNIDine (CATAPRES) 0.1 MG tablet TAKE 1 TABLET BY MOUTH TWICE DAILY  . clopidogrel (PLAVIX) 75 MG tablet TAKE 1 TABLET BY MOUTH ONCE DAILY  . diclofenac (VOLTAREN) 75 MG EC tablet TAKE 1 TABLET BY MOUTH TWICE DAILY WITH FOOD AFTER 14 DAYS TAKE ONLY AS NEEDED  . diclofenac Sodium (VOLTAREN) 2 g, Topical, 4 times daily  . docusate sodium (COLACE) 100 mg, Oral, Daily PRN  . gabapentin (NEURONTIN) 100 MG capsule Take 1 capsule at night before bed. May increase to  2 capsules after one week if needed  . hydrochlorothiazide (HYDRODIURIL) 12.5 MG tablet TAKE 1 TABLET BY MOUTH ONCE DAILY  . insulin glargine (LANTUS) 20 Units, Subcutaneous, Daily at bedtime  . ipratropium-albuterol (DUONEB) 0.5-2.5 (3) MG/3ML SOLN 3 mLs, Nebulization, Every 6 hours PRN  . metFORMIN (GLUCOPHAGE) 1,000 mg, Oral, 2 times daily  . ondansetron (ZOFRAN) 4 mg, Oral, Every 8 hours PRN  . pantoprazole (PROTONIX) 40 mg, Oral, 2 times daily  . sildenafil (REVATIO) 20 MG tablet Take 1-5 pills about 30 min prior to sex. Start with 1 and increase as needed.  . SYMBICORT 80-4.5 MCG/ACT inhaler INHALE 2 PUFFS BY MOUTH TWICE DAILY  . tiotropium (SPIRIVA HANDIHALER) 18 mcg, Inhalation, Daily  . traZODone (DESYREL) 100 MG tablet TAKE 1 TABLET BY MOUTH AT BEDTIME AS NEEDED SLEEP       Objective:   Physical Exam BP 122/64 (BP Location: Right Arm, Cuff Size: Large)   Pulse 82   Ht 5' 9"  (1.753 m)   Wt 221 lb (100.2 kg)   SpO2 97%   BMI 32.64 kg/m  GENERAL: Well-developed gentleman with truncal obesity, presents in a scooter chair. HEAD: Normocephalic, atraumatic.  EYES: Pupils equal, round, reactive to light.  No scleral icterus.  MOUTH: Edentulous, oral mucosa moist.  Has difficulty wearing facemask due to dyspnea. NECK: Supple. No thyromegaly. No nodules. No JVD.  Trachea midline, no crepitus. PULMONARY: Lungs clear to auscultation bilaterally. CARDIOVASCULAR: S1 and S2. Regular rate and rhythm.  Midsystolic late peaking ejection murmur, harsh, over the aortic area with radiation to the neck.  No gallop. GASTROINTESTINAL: Protuberant abdomen, soft. MUSCULOSKELETAL: No joint deformity, no clubbing, no edema.  NEUROLOGIC: He has a resting tremor, no overt focal deficits otherwise.  Speech is fluent. SKIN: Intact,warm,dry.  Limited exam shows no rashes. PSYCH: Mood and behavior normal.      Assessment & Plan:     ICD-10-CM   1. Shortness of breath  R06.02 ECHOCARDIOGRAM COMPLETE    I suspect the patient's symptoms may be related to aortic stenosis Patient needs to have 2D echo to evaluate this issue May have element of COPD as well  2. Nonrheumatic aortic valve stenosis  I35.0 ECHOCARDIOGRAM COMPLETE   Needs reevaluation with 2D echo ASAP Concern may have reached critical status  3. Truncal obesity  E65    This issue adds complexity to his management Will decrease compliance Will lead to increased shortness of breath    Orders Placed This Encounter  Procedures  . ECHOCARDIOGRAM COMPLETE    Standing Status:   Future    Number of Occurrences:   1    Standing Expiration Date:   04/29/2021    Order Specific Question:   Where should this test be performed    Answer:   [facility]    Order Specific Question:   Perflutren DEFINITY (image enhancing agent) should be administered unless hypersensitivity or allergy exist    Answer:   Administer Perflutren    Order Specific Question:   Reason for exam-Echo    Answer:   Aortic Valve Disorder 424.1 / I35.9    Order Specific Question:   Reason for exam-Echo    Answer:   Dyspnea  786.09 / R06.00   I discussed with the patient that he needs an echocardiogram as soon as possible.  I think his shortness of breath is due to worsening aortic valve stenosis.  He is currently on inhaler therapy that is of little relief to him and I suspect that this is due to his symptoms being mostly cardiac.  He does not have any bronchospasm noted today.  Ideally, the patient should have PFTs however I suspect that his aortic stenosis is critical and performing strenuous breath-holding and Valsalva maneuvers are to be avoided currently.  Will see the patient in follow-up after 2D echo is performed.  Renold Don, MD [facility]  *This note was dictated using voice recognition software/Dragon.  Despite best efforts to proofread, errors can occur which can change the meaning.  Any change was purely unintentional.

## 2020-01-29 NOTE — Patient Instructions (Addendum)
We will schedule echocardiogram to reevaluate aortic valve I think that this is what is causing your shortness of breath  Do follow-up with Dr. Rockey Situ in this regard  Continue your inhalers as you are now doing these appear to be doing well for you your lungs are clear today  At a later time we can schedule breathing tests however with your aortic valve being narrowed we rather not have you do any strenuous breath-holding that would be necessary for the test

## 2020-02-01 ENCOUNTER — Ambulatory Visit: Payer: Self-pay | Admitting: Pharmacist

## 2020-02-01 DIAGNOSIS — J449 Chronic obstructive pulmonary disease, unspecified: Secondary | ICD-10-CM | POA: Diagnosis not present

## 2020-02-01 DIAGNOSIS — E1165 Type 2 diabetes mellitus with hyperglycemia: Secondary | ICD-10-CM

## 2020-02-01 DIAGNOSIS — F324 Major depressive disorder, single episode, in partial remission: Secondary | ICD-10-CM | POA: Diagnosis not present

## 2020-02-01 DIAGNOSIS — I10 Essential (primary) hypertension: Secondary | ICD-10-CM

## 2020-02-01 DIAGNOSIS — G4730 Sleep apnea, unspecified: Secondary | ICD-10-CM

## 2020-02-01 NOTE — Patient Instructions (Signed)
Thank you allowing the Chronic Care Management Team to be a part of your care! It was a pleasure speaking with you today!     CCM (Chronic Care Management) Team    Noreene Larsson RN, MSN, CCM Nurse Care Coordinator  [phone]   Harlow Asa PharmD  Clinical Pharmacist  [phone]   Eula Fried LCSW Clinical Social Worker [phone]  Visit Information  Goals Addressed            This Visit's Progress   . PharmD - Medication Review       CARE PLAN ENTRY (see longitudinal plan of care for additional care plan information)   Current Barriers:  . Chronic Disease Management support, education, and care coordination needs related to HTN, OSA on CPAP, COPD, GERD, aortic atherosclerosis, tobacco use, arthritis  Pharmacist Clinical Goal(s):  [name] Over the next 30 days, patient will work with CM Pharmacist to address needs related to medication management  Interventions: . Inter-disciplinary care team collaboration (see longitudinal plan of care) . Comprehensive medication review performed; medication list updated in electronic medical record o Identify patient currently using albuterol inhaler twice daily scheduled basis and Symbicort as needed - Counsel patient on maintenance (Symbicort and Spiriva) vs rescue inhaler (albuterol) including how to use each inhaler and how to recognize the difference. Counsel patient on rinsing mouth out after each use of Symbicort. o Note patient with T2DM not currently on statin - Counsel patient on use of statin for reduction of ASCVD risk - Patient expresses interest in starting a statin and asks that I send a message to his new Cardiologist that he is seeing on 5/7 so he can discuss this addition with her. . Discuss importance of medication adherence. Mr. Purohit reports using weekly pillbox to organize his medications . Counsel on importance of blood sugar control and monitoring o Note patient followed by [facility]  Endocrinology o Reports taking: - Metformin 500 mg - 2 tablets (1000 mg) twice daily - Lantus 20 units nightly at bedtime o Reports checking CBGs twice daily in morning and at bedtime as directed by Endocrinologist o Reports recent morning CBGs ranging 122-225.  - Attributes higher readings to carbohydrates eaten overnight. o Counsel on importance of regular well-balanced meals, paying particular attention to carbohydrate portion sizes o Counsel on interpreting nutrition labels o Encourage patient to follow up with Endocrinologist for readings outside of established parameters . Counsel on importance of blood pressure control and monitoring o Reports taking: - Clonidine 0.1 mg twice daily - HCTZ 12.5 mg once daily o Denies having recently checked home BP - Confirms having home upper arm BP monitor o Encourage patient to monitor home BP, keep log and bring log with him to medical appointments . Patient reports recent issue with muscle cramps in his calf muscles o Reports has been waking up most nights for past 3-4 months with these cramps o Has tried taking yellow mustard daily without improvement o Will let PCP know . Patient advised to follow up with providers for any new/worsening medical issues/concerns o Reports will follow up with arthritis doctor regarding spine and hip pain . Will mail patient BP log and educational material on BP monitoring technique as requested. . Will collaborate with PCP . Will send message to Cardiologist at [place] regarding recommendation for statin therapy  Patient Self Care Activities:  . Self administers medications as prescribed o Uses twice daily weekly pillbox  . Attends all scheduled provider appointments o Appointment  with [place] on 5/7 o Next appointment with Endocrinologist on 6/2 o Next appointment with PCP on 9/9 . Calls pharmacy for medication refills . Calls provider office for new concerns  or questions  Initial goal documentation        Patient verbalizes understanding of instructions provided today.   Telephone follow up appointment with care management team member scheduled for: 5/14 at noon  Harlow Asa, PharmD, [place] [phone]

## 2020-02-01 NOTE — Chronic Care Management (AMB) (Signed)
Chronic Care Management   Note  02/01/2020 Name: Nathaniel Little MRN: [id number] DOB: 06-09-1946   Subjective:   Nathaniel Little is a 74 y.o. year old male who is a primary care patient of Nathaniel Little, Nathaniel Raider, FNP. The CM team was consulted for assistance with chronic disease management and care coordination.   I reached out to [name] by phone today.   Review of patient status, including review of consultants reports, laboratory and other test data, was performed as part of comprehensive evaluation and provision of chronic care management services.    Objective:  Lab Results  Component Value Date   CREATININE 1.05 12/06/2019   CREATININE 1.06 09/26/2018   CREATININE 0.87 09/07/2018    Lab Results  Component Value Date   HGBA1C 9.2 (A) 12/06/2019       Component Value Date/Time   CHOL 138 12/06/2019 1420   TRIG 86 12/06/2019 1420   HDL 38 (L) 12/06/2019 1420   CHOLHDL 3.6 12/06/2019 1420   LDLCALC 82 12/06/2019 1420    ASCVD Risk The 10-year ASCVD risk score Mikey Bussing DC Jr., et al., 2013) is: 44.7%   Values used to calculate the score:     Age: 43 years     Sex: Male     Is Non-Hispanic African American: No     Diabetic: Yes     Tobacco smoker: Yes     Systolic Blood Pressure: 654 mmHg     Is BP treated: Yes     HDL Cholesterol: 38 mg/dL     Total Cholesterol: 138 mg/dL    BP Readings from Last 3 Encounters:  01/29/20 122/64  12/06/19 (!) 149/64  10/14/19 138/64    Allergies  Allergen Reactions  . Codeine Itching    Medications Reviewed Today    Reviewed by Vella Raring, [facility] (Pharmacist) on 02/01/20 at [place] List Status: <None>  Medication Order Taking? Sig Documenting Provider Last Dose Status Informant  ACCU-CHEK AVIVA PLUS test strip [id number]   [provider]  Active   albuterol (PROVENTIL) (2.5 MG/3ML) 0.083% nebulizer solution [id number] No Take 2.5 mg by nebulization every 6 (six) hours as needed for wheezing or shortness of  breath. [provider] Not Taking Active Self           Med Note Jeanie Cooks Nov 22, 2018 10:18 AM) Patient has on hand to use but has not started  albuterol (VENTOLIN HFA) 108 (90 Base) MCG/ACT inhaler [id number] Yes Inhale 2 puffs into the lungs every 6 (six) hours as needed for wheezing or shortness of breath. Olin Hauser, DO Taking Active   aspirin EC 81 MG tablet [id number] Yes Take 81 mg by mouth daily. [provider] Taking Active Self           Med Note Winfield Cunas, Johneric Mcfadden A   Fri Feb 01, 2020 10:18 AM)    blood glucose meter kit and supplies [id number]  Dispense based on patient and insurance preference. Use up to four times daily as directed. (FOR ICD-10 E10.9, E11.9). [name], NP  Active Self  cloNIDine (CATAPRES) 0.1 MG tablet [id number] Yes TAKE 1 TABLET BY MOUTH TWICE DAILY [name], Nathaniel Raider, FNP Taking Active   clopidogrel (PLAVIX) 75 MG tablet [id number] Yes TAKE 1 TABLET BY MOUTH ONCE DAILY [name], NP Taking Active   diclofenac (VOLTAREN) 75 MG EC tablet [id number] Yes TAKE 1 TABLET BY MOUTH TWICE DAILY WITH FOOD AFTER 14 DAYS  TAKE ONLY AS NEEDED [name], Nathaniel Raider, FNP Taking Active   diclofenac Sodium (VOLTAREN) 1 % GEL [id number] Yes Apply 2 g topically 4 (four) times daily. Johnn Hai, PA-C Taking Active   docusate sodium (COLACE) 100 MG capsule [id number] No Take 100 mg by mouth daily as needed for mild constipation. [provider] Not Taking Active Self  gabapentin (NEURONTIN) 100 MG capsule [id number] Yes Take 1 capsule at night before bed. May increase to 2 capsules after one week if needed [provider] Taking Active   hydrochlorothiazide (HYDRODIURIL) 12.5 MG tablet [id number] Yes TAKE 1 TABLET BY MOUTH ONCE DAILY Karamalegos, Devonne Doughty, DO Taking Active   insulin glargine (LANTUS) 100 unit/mL SOPN [id number] Yes Inject 20 Units into the skin at bedtime. [provider] Taking  Active   ipratropium-albuterol (DUONEB) 0.5-2.5 (3) MG/3ML SOLN [id number] Yes Take 3 mLs by nebulization every 6 (six) hours as needed (wheezing, shortness of breath). [name], NP Taking Active Self  metFORMIN (GLUCOPHAGE) 500 MG tablet [id number] Yes Take 1,000 mg by mouth 2 (two) times daily.  [provider] Taking Active Self        Discontinued 02/01/20 1046 (Patient Preference)   pantoprazole (PROTONIX) 40 MG tablet [id number] Yes Take 1 tablet (40 mg total) by mouth 2 (two) times daily. Verl Bangs, FNP Taking Active   sildenafil (REVATIO) 20 MG tablet [id number] No Take 1-5 pills about 30 min prior to sex. Start with 1 and increase as needed.  Patient not taking: Reported on 02/01/2020   Olin Hauser, DO Not Taking Active   SYMBICORT 80-4.5 MCG/ACT inhaler [id number] Yes INHALE 2 PUFFS BY MOUTH TWICE DAILY [place] Devonne Doughty, DO Taking Active   tiotropium (SPIRIVA HANDIHALER) 18 MCG inhalation capsule [id number] Yes Place 1 capsule (18 mcg total) into inhaler and inhale daily. [name], NP Taking Active Self  traZODone (DESYREL) 100 MG tablet [id number] No TAKE 1 TABLET BY MOUTH AT BEDTIME AS NEEDED SLEEP  Patient not taking: Reported on 02/01/2020   Olin Hauser, DO Not Taking Active   Med List Note Sharene Butters, CPhT 11/22/18 1019): Cpap Machine at night           Assessment:   Goals Addressed            This Visit's Progress   . PharmD - Medication Review       CARE PLAN ENTRY (see longitudinal plan of care for additional care plan information)   Current Barriers:  . Chronic Disease Management support, education, and care coordination needs related to HTN, OSA on CPAP, COPD, GERD, aortic atherosclerosis, tobacco use, arthritis  Pharmacist Clinical Goal(s):  [name] Over the next 30 days, patient will work with CM Pharmacist to address needs related to medication management  Interventions: .  Inter-disciplinary care team collaboration (see longitudinal plan of care) . Comprehensive medication review performed; medication list updated in electronic medical record o Identify patient currently using albuterol inhaler twice daily scheduled basis and Symbicort as needed - Counsel patient on maintenance (Symbicort and Spiriva) vs rescue inhaler (albuterol) including how to use each inhaler and how to recognize the difference. Counsel patient on rinsing mouth out after each use of Symbicort. o Note patient with T2DM not currently on statin - Counsel patient on use of statin for reduction of ASCVD risk - Patient expresses interest in starting a statin and asks that I send a message to his new Cardiologist that he is seeing on  5/7 so he can discuss this addition with her. . Discuss importance of medication adherence. Mr. Mullane reports using weekly pillbox to organize his medications . Counsel on importance of blood sugar control and monitoring o Note patient followed by [facility] Endocrinology o Reports taking: - Metformin 500 mg - 2 tablets (1000 mg) twice daily - Lantus 20 units nightly at bedtime o Reports checking CBGs twice daily in morning and at bedtime as directed by Endocrinologist o Reports recent morning CBGs ranging 122-225.  - Attributes higher readings to carbohydrates eaten overnight. o Counsel on importance of regular well-balanced meals, paying particular attention to carbohydrate portion sizes o Counsel on interpreting nutrition labels o Encourage patient to follow up with Endocrinologist for readings outside of established parameters . Counsel on importance of blood pressure control and monitoring o Reports taking: - Clonidine 0.1 mg twice daily - HCTZ 12.5 mg once daily o Denies having recently checked home BP - Confirms having home upper arm BP monitor o Encourage patient to monitor home BP, keep log and bring log with him to medical appointments . Patient  reports recent issue with muscle cramps in his calf muscles o Reports has been waking up most nights for past 3-4 months with these cramps o Has tried taking yellow mustard daily without improvement o Will let PCP know . Patient advised to follow up with providers for any new/worsening medical issues/concerns o Reports will follow up with arthritis doctor regarding spine and hip pain . Will mail patient BP log and educational material on BP monitoring technique as requested. . Will collaborate with PCP . Will send message to Cardiologist at [place] regarding recommendation for statin therapy  Patient Self Care Activities:  . Self administers medications as prescribed o Uses twice daily weekly pillbox  . Attends all scheduled provider appointments o Appointment with [place] on 5/7 o Next appointment with Endocrinologist on 6/2 o Next appointment with PCP on 9/9 . Calls pharmacy for medication refills . Calls provider office for new concerns or questions  Initial goal documentation        Plan:  Telephone follow up appointment with care management team member scheduled for: 5/14 at noon  Harlow Asa, PharmD, [place] [phone]

## 2020-02-05 ENCOUNTER — Other Ambulatory Visit: Payer: Self-pay | Admitting: Nurse Practitioner

## 2020-02-05 ENCOUNTER — Ambulatory Visit: Payer: Medicare Other | Admitting: Family

## 2020-02-05 DIAGNOSIS — I10 Essential (primary) hypertension: Secondary | ICD-10-CM

## 2020-02-05 DIAGNOSIS — I739 Peripheral vascular disease, unspecified: Secondary | ICD-10-CM

## 2020-02-05 DIAGNOSIS — I700 Atherosclerosis of aorta: Secondary | ICD-10-CM

## 2020-02-05 NOTE — Telephone Encounter (Signed)
Requested Prescriptions  Pending Prescriptions Disp Refills  . hydrochlorothiazide (HYDRODIURIL) 12.5 MG tablet [Pharmacy Med Name: HYDROCHLOROTHIAZIDE 12.5 MG TAB] 90 tablet 0    Sig: TAKE 1 TABLET BY MOUTH ONCE DAILY     Cardiovascular: Diuretics - Thiazide Passed - 02/05/2020 12:41 PM      Passed - Ca in normal range and within 360 days    Calcium  Date Value Ref Range Status  12/06/2019 9.4 8.6 - 10.3 mg/dL Final         Passed - Cr in normal range and within 360 days    Creat  Date Value Ref Range Status  12/06/2019 1.05 0.70 - 1.18 mg/dL Final    Comment:    For patients >76 years of age, the reference limit for Creatinine is approximately 13% higher for people identified as African-American. .          Passed - K in normal range and within 360 days    Potassium  Date Value Ref Range Status  12/06/2019 4.2 3.5 - 5.3 mmol/L Final         Passed - Na in normal range and within 360 days    Sodium  Date Value Ref Range Status  12/06/2019 138 135 - 146 mmol/L Final         Passed - Last BP in normal range    BP Readings from Last 1 Encounters:  01/29/20 122/64         Passed - Valid encounter within last 6 months    Recent Outpatient Visits          2 months ago Essential hypertension   [facility], [name], FNP   5 months ago Other male erectile dysfunction   [place], DO   8 months ago Right hip pain   [facility] [name], NP   11 months ago Muscle cramping   [place], DO   1 year ago Other insomnia   [place], Jerrel Ivory, NP      Future Appointments            In 3 days Marrianne Mood D, [place], [place]   In 4 months [facility], [name], [facility], [place]           . clopidogrel (PLAVIX) 75 MG tablet [Pharmacy Med Name: CLOPIDOGREL  BISULFATE 75 MG TAB] 90 tablet 0    Sig: TAKE 1 TABLET BY MOUTH ONCE DAILY     Hematology: Antiplatelets - clopidogrel Failed - 02/05/2020 12:41 PM      Failed - Evaluate AST, ALT within 2 months of therapy initiation.      Failed - ALT in normal range and within 360 days    ALT  Date Value Ref Range Status  12/06/2019 48 (H) 9 - 46 U/L Final         Failed - AST in normal range and within 360 days    AST  Date Value Ref Range Status  12/06/2019 38 (H) 10 - 35 U/L Final         Failed - HCT in normal range and within 180 days    HCT  Date Value Ref Range Status  09/26/2018 34.9 (L) 39.0 - 52.0 % Final         Failed - HGB in normal range and within 180 days  Hemoglobin  Date Value Ref Range Status  09/26/2018 11.0 (L) 13.0 - 17.0 g/dL Final         Failed - PLT in normal range and within 180 days    Platelets  Date Value Ref Range Status  09/26/2018 353 150 - 400 K/uL Final         Passed - Valid encounter within last 6 months    Recent Outpatient Visits          2 months ago Essential hypertension   [facility], [name], FNP   5 months ago Other male erectile dysfunction   [place], DO   8 months ago Right hip pain   [facility] [name], NP   11 months ago Muscle cramping   [place], DO   1 year ago Other insomnia   [place], Jerrel Ivory, NP      Future Appointments            In 3 days Marrianne Mood D, [place], [place]   In 4 months [place], [name], [facility], [facility]

## 2020-02-07 ENCOUNTER — Ambulatory Visit (INDEPENDENT_AMBULATORY_CARE_PROVIDER_SITE_OTHER): Payer: Medicare Other

## 2020-02-07 ENCOUNTER — Other Ambulatory Visit: Payer: Self-pay

## 2020-02-07 DIAGNOSIS — R0602 Shortness of breath: Secondary | ICD-10-CM | POA: Diagnosis not present

## 2020-02-07 DIAGNOSIS — I350 Nonrheumatic aortic (valve) stenosis: Secondary | ICD-10-CM | POA: Diagnosis not present

## 2020-02-08 ENCOUNTER — Encounter: Payer: Self-pay | Admitting: Physician Assistant

## 2020-02-08 ENCOUNTER — Telehealth: Payer: Self-pay | Admitting: Physician Assistant

## 2020-02-08 ENCOUNTER — Ambulatory Visit (INDEPENDENT_AMBULATORY_CARE_PROVIDER_SITE_OTHER): Payer: Medicare Other | Admitting: Physician Assistant

## 2020-02-08 VITALS — BP 136/70 | Ht 69.0 in | Wt 217.0 lb

## 2020-02-08 DIAGNOSIS — Z6832 Body mass index (BMI) 32.0-32.9, adult: Secondary | ICD-10-CM

## 2020-02-08 DIAGNOSIS — I700 Atherosclerosis of aorta: Secondary | ICD-10-CM

## 2020-02-08 DIAGNOSIS — I10 Essential (primary) hypertension: Secondary | ICD-10-CM | POA: Diagnosis not present

## 2020-02-08 DIAGNOSIS — Z8546 Personal history of malignant neoplasm of prostate: Secondary | ICD-10-CM

## 2020-02-08 DIAGNOSIS — K219 Gastro-esophageal reflux disease without esophagitis: Secondary | ICD-10-CM

## 2020-02-08 DIAGNOSIS — I5032 Chronic diastolic (congestive) heart failure: Secondary | ICD-10-CM | POA: Diagnosis not present

## 2020-02-08 DIAGNOSIS — G4730 Sleep apnea, unspecified: Secondary | ICD-10-CM

## 2020-02-08 DIAGNOSIS — M1990 Unspecified osteoarthritis, unspecified site: Secondary | ICD-10-CM

## 2020-02-08 DIAGNOSIS — E1169 Type 2 diabetes mellitus with other specified complication: Secondary | ICD-10-CM

## 2020-02-08 DIAGNOSIS — I350 Nonrheumatic aortic (valve) stenosis: Secondary | ICD-10-CM

## 2020-02-08 DIAGNOSIS — E782 Mixed hyperlipidemia: Secondary | ICD-10-CM

## 2020-02-08 DIAGNOSIS — Z720 Tobacco use: Secondary | ICD-10-CM

## 2020-02-08 DIAGNOSIS — J449 Chronic obstructive pulmonary disease, unspecified: Secondary | ICD-10-CM

## 2020-02-08 MED ORDER — CARVEDILOL 3.125 MG PO TABS: 3.1250 mg | ORAL_TABLET | Freq: Two times a day (BID) | ORAL | 5 refills | Status: DC

## 2020-02-08 MED ORDER — HYDROCHLOROTHIAZIDE 25 MG PO TABS: 25.0000 mg | ORAL_TABLET | Freq: Every day | ORAL | 5 refills | Status: DC

## 2020-02-08 NOTE — Patient Instructions (Signed)
Medication Instructions:  Your physician has recommended you make the following change in your medication:   INCREASE HCTZ to 25 mg daily. An Rx has been sent to your pharmacy.  START Carvedilol 3.125 mg twice daily. An Rx has been sent to your pharmacy.   *If you need a refill on your cardiac medications before your next appointment, please call your pharmacy*   Lab Work: [place], [place] today If you have labs (blood work) drawn today and your tests are completely normal, you will receive your results only by: [name] MyChart Message (if you have MyChart) OR . A paper copy in the mail If you have any lab test that is abnormal or we need to change your treatment, we will call you to review the results.   Testing/Procedures: Your physician has requested that you have an echocardiogram. Echocardiography is a painless test that uses sound waves to create images of your heart. It provides your doctor with information about the size and shape of your heart and how well your heart's chambers and valves are working. This procedure takes approximately one hour. There are no restrictions for this procedure.     Follow-Up: At [facility], you and your health needs are our priority.  As part of our continuing mission to provide you with exceptional heart care, we have created designated Provider Care Teams.  These Care Teams include your primary Cardiologist (physician) and Advanced Practice Providers (APPs -  Physician Assistants and Nurse Practitioners) who all work together to provide you with the care you need, when you need it.  We recommend signing up for the patient portal called "MyChart".  Sign up information is provided on this After Visit Summary.  MyChart is used to connect with patients for Virtual Visits (Telemedicine).  Patients are able to view lab/test results, encounter notes, upcoming appointments, etc.  Non-urgent messages can be sent to your provider as well.   To learn more  about what you can do with MyChart, go to [email or web address].    Your next appointment:   After the echo  The format for your next appointment:   In Person  Provider:    You may see Ida Rogue, MD or one of the following Advanced Practice Providers on your designated Care Team:    Murray Hodgkins, NP  Christell Faith, PA-C  Marrianne Mood, PA-C    Other Instructions N/A

## 2020-02-08 NOTE — Telephone Encounter (Signed)
   Call placed to patient after noting that he had an echo that was performed yesterday (ordered by pulmonology) and now resulted as severe aortic stenosis. Notified patient of need to move forward with evaluation for possible valve replacement and will cancel order for echo and instead plan for cardiac catheterization. Patient expressed understanding and appreciation for call.   Signed, Arvil Chaco, PA-C 02/08/2020, 8:09 PM

## 2020-02-08 NOTE — Progress Notes (Signed)
Office Visit    Patient Name: Nathaniel Little Date of Encounter: 02/08/2020  Primary Care Provider:  Verl Bangs, FNP Primary Cardiologist:  Nathaniel Rogue, MD  Chief Complaint    Chief Complaint  Patient presents with  . office visit    12 month F/U-Echo 02/07/2020; Meds reviewed with patient.    74 yo male with history of HFpEF, severe AS, aortic atherosclerosis, HTN, HLD, PAD and s/p stent to GDA and hepatic arteries after common and proper hepatic arteries were found dissected and occluded 07/2018, COPD, current tobacco use, obesity, sleep apnea on CPAP, prostate CA [place], h/o pancreatic mass / IPMN s/p Whipple procedure 07/20/2018, GERD, and here today for 1 year follow-up.  Past Medical History    Past Medical History:  Diagnosis Date  . (HFpEF) heart failure with preserved ejection fraction ([place])    a. 08/2017 Echo: EF 60-65%, Gr1 DD.  [name] Arthritis   . Chest pain    a. 08/2017 Lexiscan MV: EF 53%, no ischemia-->Low risk.  [name] COPD (chronic obstructive pulmonary disease) ([place])   . Depression   . Diabetes mellitus without complication ([place])   . Dyspnea   . Heart murmur   . History of orthopnea   . Hypertension   . Lymphedema   . Moderate aortic stenosis    a. 08/2017 Echo: EF 60-65%, no rwma, GR1 DD, Mod Ao Stenosis, mean grad 12mg. Valve area (VTI): 1.04 cm^2. Mildly dil LA.  . Morbid obesity ([place]   . Prostate cancer ([place] 03/2013   ? PANCREATIC  . Sleep apnea   . Sleep apnea with use of continuous positive airway pressure (CPAP)   . Tobacco abuse   . Tremor    HEAD  . Wheezing    OCCAS   Past Surgical History:  Procedure Laterality Date  . ADENOIDECTOMY    . APPENDECTOMY    . CATARACT EXTRACTION W/PHACO Left 11/28/2018   Procedure: CATARACT EXTRACTION PHACO AND INTRAOCULAR LENS PLACEMENT (IOC) LEFT, DIABETIC;  Surgeon: PBirder Robson MD;  Location: [facility] ORS;  Service: Ophthalmology;  Laterality: Left;  [place] 00:56 CDE  9.44 Fluid pack lot # [id number]  . EUS N/A 10/13/2017   Procedure: FULL UPPER ENDOSCOPIC ULTRASOUND (EUS) RADIAL;  Surgeon: [name];  [facility];  Service: Gastroenterology;  Laterality: N/A;  . PANCREATICODUODENECTOMY  07/23/2018  . TONSILLECTOMY      Allergies  Allergies  Allergen Reactions  . Codeine Itching    History of Present Illness    HCambren Little a 74y.o. male with PMH as above. He was evaluated in 2018 for intermittent exertional CP and DOE with stress testing without evidence of ischemia. He was last seen by his primary cardiologist 01/19/2019, at which time he was reportedly doing well. He reported chronic leg pain that sometimes occurred while he slept and sometimes during the day. He was SOB at times with use of inhalers and smoking 2 cigarettes daily. He was able to reportedly walk 18 blocks without issue. At the time of his last visit, his echo showed EF 65-70% with moderate concentric hypertrophy, G1DD, and moderate aortic stenosis. Recommendations were to start a statin but on review of EMR, it does not appear that he has yet started this medication. It was recommended he repeat an echo in 1-2 years. Smoking cessation was advised.  He underwent Whipple procedure 07/2018 by [facility]. Review of EMR notes he presented 07/20/2018 for Whipple for IPMN.  Robotic whipple was performed. In the postoperative period, however, he had hematemesis 10/26 concerning for GIB. The common and proper hepatic arteries were found to be dissected and occluded; however, it was also noted that the patient had robust portal flow to the liver. Vascular surgery was consulted and the patient taken to the OR with VIR where a stent was placed in his GDA and hepatic arteries. He was started on Plavix.  Today, he returns to clinic and reports he is doing relatively well. He reports his biggest issue lately has been arthritis, especially with the recent rain. He reports fatigue and  SOB/DOE and that pulmonology has recently been very concerned regarding his murmur during their exams. He states that pulmonology states his problems are "not due to his lungs but due to his heart." He denies palpitations, pnd, orthopnea, n, v, dizziness, syncope, edema, weight gain, or early satiety. He reports ongoing cough that produces white sputum.  He reports upper epigastric or atypical CP while in bed last night that was consistent with his GERD and resolved on its own within minutes. He is smoking 2 cigarettes per day. He reports medication and CPAP compliance. No s/sx of bleeding.   Home Medications    Prior to Admission medications   Medication Sig Start Date End Date Taking? Authorizing Provider  ACCU-CHEK AVIVA PLUS test strip  05/24/19  Yes [provider]  albuterol (PROVENTIL) (2.5 MG/3ML) 0.083% nebulizer solution Take 2.5 mg by nebulization every 6 (six) hours as needed for wheezing or shortness of breath.   Yes [provider]  albuterol (VENTOLIN HFA) 108 (90 Base) MCG/ACT inhaler Inhale 2 puffs into the lungs every 6 (six) hours as needed for wheezing or shortness of breath. 08/22/19  Yes Nathaniel Little, Nathaniel Doughty, DO  aspirin EC 81 MG tablet Take 81 mg by mouth daily.   Yes [provider]  blood glucose meter kit and supplies Dispense based on patient and insurance preference. Use up to four times daily as directed. (FOR ICD-10 E10.9, E11.9). 08/18/18  Yes Nathaniel College, NP  cloNIDine (CATAPRES) 0.1 MG tablet TAKE 1 TABLET BY MOUTH TWICE DAILY 01/07/20  Yes Little, Nathaniel Raider, FNP  clopidogrel (PLAVIX) 75 MG tablet TAKE 1 TABLET BY MOUTH ONCE DAILY 02/05/20  Yes Little, Nathaniel Raider, FNP  diclofenac (VOLTAREN) 75 MG EC tablet TAKE 1 TABLET BY MOUTH TWICE DAILY WITH FOOD AFTER 14 DAYS TAKE ONLY AS NEEDED 01/08/20  Yes Little, Nathaniel Raider, FNP  diclofenac Sodium (VOLTAREN) 1 % GEL Apply 2 g topically 4 (four) times daily. 10/14/19  Yes Johnn Hai, PA-C   Ferrous Sulfate (IRON SUPPLEMENT PO) Take by mouth.   Yes [provider]  gabapentin (NEURONTIN) 100 MG capsule Take 200 mg by mouth at bedtime.  01/29/19  Yes [provider]  hydrochlorothiazide (HYDRODIURIL) 12.5 MG tablet TAKE 1 TABLET BY MOUTH ONCE DAILY 02/05/20  Yes Little, Nathaniel Raider, FNP  insulin glargine (LANTUS) 100 unit/mL SOPN Inject 20 Units into the skin at bedtime.   Yes [provider]  ipratropium-albuterol (DUONEB) 0.5-2.5 (3) MG/3ML SOLN Take 3 mLs by nebulization every 6 (six) hours as needed (wheezing, shortness of breath). 04/20/17  Yes Nathaniel College, NP  metFORMIN (GLUCOPHAGE) 500 MG tablet Take 1,000 mg by mouth 2 (two) times daily.  11/04/18  Yes [provider]  pantoprazole (PROTONIX) 40 MG tablet Take 1 tablet (40 mg total) by mouth 2 (two) times daily. 12/06/19 12/05/20 Yes Little, Nathaniel Raider, FNP  SYMBICORT 80-4.5 MCG/ACT inhaler INHALE 2 PUFFS BY MOUTH TWICE DAILY 08/17/19  Yes Nathaniel Little, Nathaniel Doughty, DO  tiotropium (SPIRIVA HANDIHALER) 18 MCG inhalation capsule Place 1 capsule (18 mcg total) into inhaler and inhale daily. 11/20/18  Yes Nathaniel College, NP  traZODone (DESYREL) 100 MG tablet Take 100 mg by mouth at bedtime as needed for sleep.   Yes [provider]  docusate sodium (COLACE) 100 MG capsule Take 100 mg by mouth daily as needed for mild constipation.    [provider]  sildenafil (REVATIO) 20 MG tablet Take 1-5 pills about 30 min prior to sex. Start with 1 and increase as needed. Patient not taking: Reported on 02/01/2020 08/13/19   Olin Hauser, DO    Review of Systems     He reports fatigue and ongoing SOB/DOE. He denies palpitations, pnd, orthopnea, n, v, dizziness, syncope, edema, weight gain, or early satiety. He reports CP that was consistent with his GERD the previous night without recurrence. He reports a cough that occasionally produces white sputum and has been ongoing for some  time.   All other systems reviewed and are otherwise negative except as noted above.  Physical Exam    VS:  BP 136/70 (BP Location: Right Arm, Patient Position: Sitting, Cuff Size: Large)   Ht 5' 9" (1.753 m)   Wt 217 lb (98.4 kg)   SpO2 98%   BMI 32.05 kg/m  , BMI Body mass index is 32.05 kg/m. GEN: Well nourished, well developed, in no acute distress. HEENT: normal. Head tremor noted on exam. Neck: Supple, no JVD, carotid bruit difficult to appreciate given radiation of murmur into bilateral carotids, no masses. Cardiac: RRR, 3/6 harsh systolic murmur best appreciated at bilateral upper sternal borders and radiating to bilateral carotids. Systolic murmur appreciated throughout the cardiac exam. No rubs or gallops. No clubbing, cyanosis. Moderate  bilateral edema.  Radials/DP/PT 2+ and equal bilaterally.  Respiratory:  Respirations regular and unlabored, bibasilar crackles.  GI: Soft, nontender, nondistended, BS + x 4. MS: no deformity or atrophy. Skin: warm and dry, no rash. Neuro:  Strength and sensation are intact. Psych: Normal affect.  Accessory Clinical Findings    ECG personally reviewed by me today -NSR, 79 bpm, IVCD, LAFB, LAD, previous inferior infarct, cannot rule out anterior infarct- no acute changes.  VITALS Reviewed today   Temp Readings from Last 3 Encounters:  12/06/19 (!) 97.1 F (36.2 C) (Oral)  10/14/19 97.8 F (36.6 C) (Oral)  09/19/19 (!) 97.2 F (36.2 C) (Oral)   BP Readings from Last 3 Encounters:  02/08/20 136/70  01/29/20 122/64  12/06/19 (!) 149/64   Pulse Readings from Last 3 Encounters:  01/29/20 82  12/06/19 80  10/14/19 80    Wt Readings from Last 3 Encounters:  02/08/20 217 lb (98.4 kg)  01/29/20 221 lb (100.2 kg)  12/06/19 224 lb 12.8 oz (102 kg)     LABS  reviewed today   [place] present and most recent? Yes/No: No  Lab Results  Component Value Date   WBC CANCELED 12/06/2019   HGB 11.0 (L) 09/26/2018   HCT 34.9  (L) 09/26/2018   MCV 81.7 09/26/2018   PLT 353 09/26/2018   Lab Results  Component Value Date   CREATININE 1.05 12/06/2019   BUN 18 12/06/2019   NA 138 12/06/2019   K 4.2 12/06/2019   CL 106 12/06/2019   CO2 21 12/06/2019   Lab Results  Component Value Date   ALT  48 (H) 12/06/2019   AST 38 (H) 12/06/2019   ALKPHOS 99 09/26/2018   BILITOT 0.4 12/06/2019   Lab Results  Component Value Date   CHOL 138 12/06/2019   HDL 38 (L) 12/06/2019   LDLCALC 82 12/06/2019   TRIG 86 12/06/2019   CHOLHDL 3.6 12/06/2019    Lab Results  Component Value Date   HGBA1C 9.2 (A) 12/06/2019   Lab Results  Component Value Date   TSH 1.22 01/06/2017     STUDIES/PROCEDURES reviewed today   Echo 02/07/20 1. Left ventricular ejection fraction, by estimation, is 60 to 65%. The  left ventricle has normal function. The left ventricle has no regional  wall motion abnormalities. There is mild left ventricular hypertrophy.  Left ventricular diastolic parameters  are consistent with Grade I diastolic dysfunction (impaired relaxation).  2. Right ventricular systolic function is normal. The right ventricular  size is normal.  3. Left atrial size was mildly dilated.  4. The mitral valve is normal in structure. No evidence of mitral valve  regurgitation.  5. Aortic valve DVI = 0.23, Peak gradient 60mHg, Mean Gradient 37, peak  velocity 4.130m. Unable to determine valve morphology due to image  quality. Aortic valve regurgitation is not visualized. Severe aortic valve  stenosis. Aortic valve area, by VTI  measures 0.85 cm.  6. Aortic root is upper limit of normal in size.   Assessment & Plan    Severe Aortic stenosis --Harsh 2 to 3/6 systolic murmur appreciated on exam today and radiating to the bilateral carotids. Reports SOB/DOE that is likely at least in part 2/2 worsening valvular function with ongoing tobacco use and AOC HFpEF also considered.  Pulmonology ordered an echo due to their  concern regarding his murmur with updated echo performed yesterday as above showing severe AS. Message sent to TAVR team today with recommendation for further workup as indicated by the structural team for replacement of valve and with likely R/LHC. Of note, he reports need for at least 2 weeks notice for any tests or procedures so that he is able to arrange transportation.   HFpEF --Reports SOB/DOE and requests today that we increase his diuretic. He is volume up on exam today with bilateral crackles appreciated. Most recent echo as above with worsening valvular dz. We will increase HCTZ to 2575maily for further BP and volume control. Recheck a BMET today and in 1 week. In the future, consider transitioning to lasix. For now, continue on HCTZ. Ongoing sodium and volume intake monitoring recommended.   Essential HTN --BP borderline today at 136/70 and goal BP less than 130/80. Given HR 79bpm, will start Coreg 3.125m33mD. As above, increase HCTZ to 25mg13me daily with recheck of BMET today and in 1 week. Recommend discontinue clonidine given adverse side effect profile associated with this medication. This was discussed with patient today. Given comorbid HTN and DM2, recommend start of ACE/ARB at RTC if renal function allows and with discontinuation of clonidine at that time.   HLD --Recheck lipid and liver function. Pending these labs, plan for atorvastatin 80mg 53my for risk factor modification.   PAD  Stenting to GDA and hepatic arteries --As above, he is s/p stenting after 07/29/18 hematemesis and discovered commona and proper hepatic arteries found to be dissected and occluded. Continue Plavix. He denies any s/sx of bleeding today. Recheck CBC. Recommend he start a statin today as above.   DM2 --As above, recommend start of ACE/ARB at RTC if renal function allows given  comorbid HTN and DM2. Glycemic control recommended for ongoing risk factor modificaton.   Tobacco use, COPD --Complete  cessation advised. Currently reports smoking 2 cigarettes per day. Continue inhalers.   Sleep apnea on CPAP --Continued CPAP use encouraged.   Medication changes: Increase HCTZ to 60m daily. Start Coreg 3.1244mBID. Tentative plan for atorvastatin 8038maily. Labs ordered: BMET, CBC, lipids, LFTs. Studies / Imaging ordered: [facility] team[facility]   Future considerations: R/LHC per structural team. Likely start atorvastatin 74m22mily. Recommend discontinue clonidine once able. Recommend ACE/ARB consideration at RTC if renal function allows given comorbid HTN and DM2.  Disposition: RTC 1 month.  JacqArvil Chaco-C 02/08/2020

## 2020-02-09 LAB — COMPREHENSIVE METABOLIC PANEL
ALT: 37 IU/L (ref 0–44)
AST: 39 IU/L (ref 0–40)
Albumin/Globulin Ratio: 1.8 (ref 1.2–2.2)
Albumin: 4.4 g/dL (ref 3.7–4.7)
Alkaline Phosphatase: 101 IU/L (ref 39–117)
BUN/Creatinine Ratio: 20 (ref 10–24)
BUN: 20 mg/dL (ref 8–27)
Bilirubin Total: 0.5 mg/dL (ref 0.0–1.2)
CO2: 22 mmol/L (ref 20–29)
Calcium: 9.7 mg/dL (ref 8.6–10.2)
Chloride: 105 mmol/L (ref 96–106)
Creatinine, Ser: 0.99 mg/dL (ref 0.76–1.27)
GFR calc Af Amer: 86 mL/min/{1.73_m2} (ref >59)
GFR calc non Af Amer: 75 mL/min/{1.73_m2} (ref >59)
Globulin, Total: 2.4 g/dL (ref 1.5–4.5)
Glucose: 139 mg/dL — ABNORMAL HIGH (ref 65–99)
Potassium: 3.9 mmol/L (ref 3.5–5.2)
Sodium: 142 mmol/L (ref 134–144)
Total Protein: 6.8 g/dL (ref 6.0–8.5)

## 2020-02-09 LAB — LIPID PANEL
Chol/HDL Ratio: 3.9 ratio (ref 0.0–5.0)
Cholesterol, Total: 149 mg/dL (ref 100–199)
HDL: 38 mg/dL — ABNORMAL LOW (ref >39)
LDL Chol Calc (NIH): 91 mg/dL (ref 0–99)
Triglycerides: 108 mg/dL (ref 0–149)
VLDL Cholesterol Cal: 20 mg/dL (ref 5–40)

## 2020-02-09 LAB — CBC WITH DIFFERENTIAL/PLATELET
Basophils Absolute: 0 10*3/uL (ref 0.0–0.2)
Basos: 0 %
EOS (ABSOLUTE): 0.1 10*3/uL (ref 0.0–0.4)
Eos: 1 %
Hematocrit: 37.2 % — ABNORMAL LOW (ref 37.5–51.0)
Hemoglobin: 11.5 g/dL — ABNORMAL LOW (ref 13.0–17.7)
Immature Grans (Abs): 0 10*3/uL (ref 0.0–0.1)
Immature Granulocytes: 0 %
Lymphocytes Absolute: 1.1 10*3/uL (ref 0.7–3.1)
Lymphs: 15 %
MCH: 24.8 pg — ABNORMAL LOW (ref 26.6–33.0)
MCHC: 30.9 g/dL — ABNORMAL LOW (ref 31.5–35.7)
MCV: 80 fL (ref 79–97)
Monocytes Absolute: 0.4 10*3/uL (ref 0.1–0.9)
Monocytes: 6 %
Neutrophils Absolute: 5.7 10*3/uL (ref 1.4–7.0)
Neutrophils: 78 %
Platelets: 249 10*3/uL (ref 150–450)
RBC: 4.64 x10E6/uL (ref 4.14–5.80)
RDW: 16.9 % — ABNORMAL HIGH (ref 11.6–15.4)
WBC: 7.3 10*3/uL (ref 3.4–10.8)

## 2020-02-11 ENCOUNTER — Telehealth: Payer: Self-pay | Admitting: *Deleted

## 2020-02-11 MED ORDER — ATORVASTATIN CALCIUM 80 MG PO TABS: 80.0000 mg | ORAL_TABLET | Freq: Every day | ORAL | 6 refills | Status: DC

## 2020-02-11 NOTE — Telephone Encounter (Signed)
Arvil Chaco, PA-C  02/11/2020 10:19 AM EDT    Please let Mr. Yo know that his labs showed his renal function and electrolytes are stable. His hemoglobin is low but remains stable when compared with previous labs. His liver function is normal. His total cholesterol is 149 and LDL (bad cholesterol) 91 with HDL (good cholesterol) low at 38. Given his history of PAD, I would recommend starting him on a statin to reduce his LDL. If agreeable to start a statin medication, let's start atorvastatin 80mg  and then recheck his lipid and liver function in 6-8 weeks. Please let him know to contact us if any problems with starting this medication.

## 2020-02-11 NOTE — Telephone Encounter (Signed)
-----   Message from Arvil Chaco, [place] sent at 02/11/2020 12:57 PM EDT ----- Regarding: FW: Aortic Stenosis Hey there,  I discussed it with him over the phone as a definite and during the visit as an "if and then" type thing, since I was fairly certain his valve needed replaced at that time. Does that suffice? I can call him again and ask him to come in if not - just let me know.  Thanks! ----- Message ----- From: Emily Filbert, RN Sent: 02/11/2020  11:06 AM EDT To: Arvil Chaco, PA-C Subject: RE: Aortic Stenosis                            Dub Amis,  Did you discuss the right and left heart cath with the patient? Or does he need to come in for an updated H&P to discuss the procedure/ risks?  Thank you! Hendel Gatliff ----- Message ----- From: Arvil Chaco, PA-C Sent: 02/11/2020  10:06 AM EDT To: Rebeca Alert [facility] Triage Subject: Aortic Stenosis                                Hi there,  This patient was scheduled for an echo; however, it appears that pulmonology already ordered and resulted an echo that did indeed show severe aortic stenosis for the patient. I called the patient over the weekend and updated him of my plan to put him in touch with the TAVR team for further consideration.   Could you please cancel his echo and let him know we need to schedule him for a right and left heart cardiac catheterization? Please send me the details once you schedule him, so that I can place the orders. I will also send his name along to the TAVR team.  Thanks!  JV

## 2020-02-11 NOTE — Telephone Encounter (Signed)
I spoke with the patient about:  1) scheduling a right and left heart cath per Marrianne Mood, PA to assess her AV prior to following up with the TAVR team  2) starting atorvastatin 80 mg once daily due to his LDL of 91 with history of PAD  Per the patient, he is agreeable with the above recommendations. He would like to have his heart cath done as soon as possible. I advised this could be done this Thursday or Friday with Dr. Rockey Situ or next Thursday or Friday with Dr. Rockey Situ. The patient will need to call [facility] to arrange a ride for both his COVID test & heart cath. He is unsure if this is too short a notice for this. I have advised I will go ahead and work on setting up his cath for Friday 5/14 per his request. He is aware he will need a COVID test on 5/12.  The patient will call transportation this afternoon to see if they can bring him to his appointments this week. He is aware to call me back this afternoon if he is able to confirm one way or the other. Otherwise, he is aware I will call him back tomorrow with the details of his procedure.  Atorvastatin 80 mg will be sent in to [address].

## 2020-02-12 ENCOUNTER — Telehealth: Payer: Self-pay | Admitting: Cardiovascular Disease

## 2020-02-12 ENCOUNTER — Ambulatory Visit (INDEPENDENT_AMBULATORY_CARE_PROVIDER_SITE_OTHER): Payer: Medicare Other | Admitting: Licensed Clinical Social Worker

## 2020-02-12 DIAGNOSIS — J449 Chronic obstructive pulmonary disease, unspecified: Secondary | ICD-10-CM | POA: Diagnosis not present

## 2020-02-12 DIAGNOSIS — F324 Major depressive disorder, single episode, in partial remission: Secondary | ICD-10-CM | POA: Diagnosis not present

## 2020-02-12 DIAGNOSIS — E1165 Type 2 diabetes mellitus with hyperglycemia: Secondary | ICD-10-CM

## 2020-02-12 DIAGNOSIS — I10 Essential (primary) hypertension: Secondary | ICD-10-CM

## 2020-02-12 DIAGNOSIS — F4310 Post-traumatic stress disorder, unspecified: Secondary | ICD-10-CM

## 2020-02-12 NOTE — Telephone Encounter (Signed)
No answer. Left message to call back.   

## 2020-02-12 NOTE — Telephone Encounter (Signed)
Patient wanted to let us know he has his COVID test tomorrow and will be ready for his procedure on Friday

## 2020-02-12 NOTE — Telephone Encounter (Signed)
Concern with patient's transportation post procedure.  Per special recovery, patient needs a friend or family member to take patient home and be with him for 24 hours post. It is not acceptable for patient to go home via [employer] transportation.  Discussed with Mickle Plumb, [place] and Dr Rockey Situ. It is unlikely patient could stay the night after the cath with no intervention or other medical reason.  Called patient to discuss. His original plan was to have a friend bring him for Covid test tomorrow and ride [employer] on Friday, to and home for procedure.   Patient made aware that [place] is acceptable for the Covid test and bringing him to the procedure but that a family member or friend needs to take him home and be with him for 24 hours after. Patient said he would need to call around before knowing if he has someone for that. I gave him other possible dates of 5/20 and 5/21 and he knows his Covid test would be on 5/18.  He may not know until tomorrow afternoon who will be able to help him.  He will call us back. May need to reschedule procedure for Friday.

## 2020-02-12 NOTE — Telephone Encounter (Signed)
This is the second encounter concerning patient's procedure. Will close this one and document on phone note started yesterday, 02/11/20.

## 2020-02-12 NOTE — Chronic Care Management (AMB) (Signed)
Chronic Care Management    Clinical Social Work Follow Up Note  02/12/2020 Name: Nathaniel Little MRN: [id number] DOB: [birth year]  Nathaniel Little is a 74 y.o. year old male who is a primary care patient of Lorine Bears, [name], FNP. The CCM team was consulted for assistance with [employer] .   Review of patient status, including review of consultants reports, other relevant assessments, and collaboration with appropriate care team members and the patient's provider was performed as part of comprehensive patient evaluation and provision of chronic care management services.    SDOH (Social Determinants of Health) assessments performed: Yes    Outpatient Encounter Medications as of 02/12/2020  Medication Sig  . ACCU-CHEK AVIVA PLUS test strip   . albuterol (PROVENTIL) (2.5 MG/3ML) 0.083% nebulizer solution Take 2.5 mg by nebulization every 6 (six) hours as needed for wheezing or shortness of breath.  [name] albuterol (VENTOLIN HFA) 108 (90 Base) MCG/ACT inhaler Inhale 2 puffs into the lungs every 6 (six) hours as needed for wheezing or shortness of breath.  [name] aspirin EC 81 MG tablet Take 81 mg by mouth daily.  [name] atorvastatin (LIPITOR) 80 MG tablet Take 1 tablet (80 mg total) by mouth daily.  . blood glucose meter kit and supplies Dispense based on patient and insurance preference. Use up to four times daily as directed. (FOR ICD-10 E10.9, E11.9).  . carvedilol (COREG) 3.125 MG tablet Take 1 tablet (3.125 mg total) by mouth 2 (two) times daily.  . cloNIDine (CATAPRES) 0.1 MG tablet TAKE 1 TABLET BY MOUTH TWICE DAILY  . clopidogrel (PLAVIX) 75 MG tablet TAKE 1 TABLET BY MOUTH ONCE DAILY  . diclofenac (VOLTAREN) 75 MG EC tablet TAKE 1 TABLET BY MOUTH TWICE DAILY WITH FOOD AFTER 14 DAYS TAKE ONLY AS NEEDED  . diclofenac Sodium (VOLTAREN) 1 % GEL Apply 2 g topically 4 (four) times daily.  [name] docusate sodium (COLACE) 100 MG capsule Take 100 mg by mouth daily as needed for mild constipation.  . Ferrous  Sulfate (IRON SUPPLEMENT PO) Take by mouth.  . gabapentin (NEURONTIN) 100 MG capsule Take 200 mg by mouth at bedtime.   . hydrochlorothiazide (HYDRODIURIL) 25 MG tablet Take 1 tablet (25 mg total) by mouth daily.  . insulin glargine (LANTUS) 100 unit/mL SOPN Inject 20 Units into the skin at bedtime.  [name] ipratropium-albuterol (DUONEB) 0.5-2.5 (3) MG/3ML SOLN Take 3 mLs by nebulization every 6 (six) hours as needed (wheezing, shortness of breath).  . metFORMIN (GLUCOPHAGE) 500 MG tablet Take 1,000 mg by mouth 2 (two) times daily.   . pantoprazole (PROTONIX) 40 MG tablet Take 1 tablet (40 mg total) by mouth 2 (two) times daily.  . sildenafil (REVATIO) 20 MG tablet Take 1-5 pills about 30 min prior to sex. Start with 1 and increase as needed. (Patient not taking: Reported on 02/01/2020)  . SYMBICORT 80-4.5 MCG/ACT inhaler INHALE 2 PUFFS BY MOUTH TWICE DAILY  . tiotropium (SPIRIVA HANDIHALER) 18 MCG inhalation capsule Place 1 capsule (18 mcg total) into inhaler and inhale daily.  . traZODone (DESYREL) 100 MG tablet Take 100 mg by mouth at bedtime as needed for sleep.   No facility-administered encounter medications on file as of 02/12/2020.     Goals Addressed    . SW: I need some extra support right now (pt-stated)       CARE PLAN ENTRY (see longtitudinal plan of care for additional care plan information)  Current Barriers:  . Financial constraints related to managing health care expenses and  affording needed DME . Limited social support . ADL IADL limitations . Social Isolation . Limited access to caregiver . Inability to perform ADL's independently . Inability to perform IADL's independently  Clinical Social Work Clinical Goal(s):  [name] Over the next 120 days, patient will work with SW to address concerns related to gaining additional support/resource connection in order to maintain health . Over the next 120 days, patient will demonstrate improved adherence to self care as evidenced by  implementing healthy self-care into his daily routine such as: attending all medical appointments, taking time for self-reflection, taking medications as prescribed, drinking water and daily exercise to improve mobility.  Interventions: . Patient interviewed and appropriate assessments performed. Referral received from PCP-He has COPD and is well controlled without exacerbation but recently went back to smoking and now has daily coughing with increased mucus production nightly around 6pm.  I am hoping to have help with reaching out to these specialists offices and making sure he can get to them.  He has a rolling walker and utilizes mass transit for transportation.  . Provided patient with information about available transportation benefits within his insurance. Patient use [employer] in order to get to his medical appointments. However, they will not drive over 25 mins from his residence. Patient reports that he will have a follow up cancer appointment in [place] at [place] next year and will need transportation assistance for this arrangement. LCSW provided education on [facility] transportation and patient confirms that he has used this service in the past and will do so again when needed transportation arrangements out of [employer]. . Discussed plans with patient for ongoing care management follow up and provided patient with direct contact information for care management team . Education provided in regards improving self-care. Patient admits that he has started back smoking cigarettes after 4 months of discontinuing them. Patient reports smoking 3 cigarettes per day still. Advised patient to implement appropriate coping skills and self-care tools into his daily routine to improve overall health care.  . Patient reports that his mouth is very dry from using his CPAP machine at night. He reports that this has caused him some complications and irritation of the skin.  . Assisted  patient/caregiver with obtaining information about health plan benefits . Patient shares that he was put on new medication injection that he uses every night to help keep his blood sugars low. He reports that he has only been on this medication for a week and has already seen positive outcomes. . Provided education and assistance to client regarding Advanced Directives. . Patient reports that he is sleeping a lot more but does not contribute this to any mental health concern but rather his health and fatigue. Emotional support and active reflective listening provided. Patient denied needing mental health resource education at this time.  . Patient reports that he has a COVID test tomorrow and a heart specialist appointment on Friday. Patient confirms stable transportation through [facility]. Patient wishes to reschedule his upcoming CCM Pharmacist appointment as it is also on Friday. In basket message sent to [facility] Pharmacist.  . Patient reports that he will be starting a new medication for his cholesterol soon.   Patient Self Care Activities:  . Self administers medications as prescribed . Attends all scheduled provider appointments . Calls provider office for new concerns or questions . Lacks social connections  Please see past updates related to this goal by clicking on the "Past Updates" button in the  selected goal       Follow Up Plan: SW will follow up with patient by phone over the next quarter   Eula Fried, [place], MSW, [email or web address] Phone: [phone]

## 2020-02-13 ENCOUNTER — Telehealth: Payer: Self-pay | Admitting: Family Medicine

## 2020-02-13 ENCOUNTER — Telehealth: Payer: Self-pay | Admitting: Cardiovascular Disease

## 2020-02-13 ENCOUNTER — Other Ambulatory Visit: Admission: RE | Admit: 2020-02-13 | Payer: Medicare Other | Source: Ambulatory Visit

## 2020-02-13 NOTE — Telephone Encounter (Signed)
Called the patient to discuss. Unable to lmtcb, patients voicemail is full. Will to contact the patient again.

## 2020-02-13 NOTE — Telephone Encounter (Addendum)
2nd attempt to contact the patient. Unable to lmtcb. Patients voicemail is full.

## 2020-02-13 NOTE — Chronic Care Management (AMB) (Signed)
  Care Management   Note  02/13/2020 Name: Nathaniel Little MRN: [id number] DOB: Aug 16, 1946  Nathaniel Little is a 74 y.o. year old male who is a primary care patient of [place], [name], [place] and is actively engaged with the care management team. I reached out to [name] by phone today to assist with re-scheduling a follow up visit with the Pharmacist  Follow up plan: The care management team will reach out to the patient again over the next 7 days.  If patient returns call to provider office, please advise to call [facility]  at [place], [place], [place], [place] [address] Direct Dial: [phone] [email or web address] Website: [email or web address]

## 2020-02-13 NOTE — Telephone Encounter (Signed)
New Message    Pt called after hours line and is wanting to change his procedure appt and covid test    Please call

## 2020-02-13 NOTE — Telephone Encounter (Signed)
Duplicate encounters see 02/13/20 telephone encounter.

## 2020-02-14 NOTE — Telephone Encounter (Signed)
Spoke with patient and he states that he cancelled cath and is working on finding someone for transportation and stay with him for 24 hours after. He stated that he would give [name] a call back once he gets all of that arranged.

## 2020-02-14 NOTE — Telephone Encounter (Signed)
No answer/Voicemail box is full.  

## 2020-02-14 NOTE — Telephone Encounter (Signed)
Spoke with patient and he wanted to cancel his heart cath and will call back once he is able to find someone to bring him and stay with him for 24 hours after his procedure. He had no further questions at this time.

## 2020-02-15 ENCOUNTER — Encounter: Admission: RE | Payer: Self-pay | Source: Home / Self Care

## 2020-02-15 ENCOUNTER — Ambulatory Visit: Admission: RE | Admit: 2020-02-15 | Payer: Medicare Other | Source: Home / Self Care | Admitting: Cardiovascular Disease

## 2020-02-15 ENCOUNTER — Telehealth: Payer: Self-pay

## 2020-02-15 DIAGNOSIS — I350 Nonrheumatic aortic (valve) stenosis: Secondary | ICD-10-CM

## 2020-02-15 SURGERY — RIGHT/LEFT HEART CATH AND CORONARY ANGIOGRAPHY
Anesthesia: Moderate Sedation | Laterality: Bilateral

## 2020-02-15 NOTE — Chronic Care Management (AMB) (Signed)
  Care Management   Note  02/15/2020 Name: Adler Chartrand MRN: [id number] DOB: 1945-10-20  Ranny Wiebelhaus is a 74 y.o. year old male who is a primary care patient of [place], [name], [place] and is actively engaged with the care management team. I reached out to [name] by phone today to assist with re-scheduling a follow up visit with the Pharmacist  Follow up plan: Telephone appointment with care management team member scheduled for:02/29/2020  Noreene Larsson, [place], [place], [place] [address] Direct Dial: [phone] [email or web address] Website: [email or web address]

## 2020-02-18 ENCOUNTER — Emergency Department: Payer: Medicare Other

## 2020-02-18 ENCOUNTER — Inpatient Hospital Stay
Admission: EM | Admit: 2020-02-18 | Discharge: 2020-02-21 | DRG: 391 | Disposition: A | Discharge status: Home / Self Care | Payer: Medicare Other | Attending: Internal Medicine | Admitting: Internal Medicine

## 2020-02-18 ENCOUNTER — Other Ambulatory Visit: Payer: Self-pay

## 2020-02-18 DIAGNOSIS — I200 Unstable angina: Secondary | ICD-10-CM | POA: Diagnosis not present

## 2020-02-18 DIAGNOSIS — R0602 Shortness of breath: Secondary | ICD-10-CM | POA: Diagnosis not present

## 2020-02-18 DIAGNOSIS — Z20822 Contact with and (suspected) exposure to covid-19: Secondary | ICD-10-CM | POA: Diagnosis present

## 2020-02-18 DIAGNOSIS — I5032 Chronic diastolic (congestive) heart failure: Secondary | ICD-10-CM | POA: Diagnosis present

## 2020-02-18 DIAGNOSIS — I350 Nonrheumatic aortic (valve) stenosis: Secondary | ICD-10-CM | POA: Diagnosis present

## 2020-02-18 DIAGNOSIS — G4733 Obstructive sleep apnea (adult) (pediatric): Secondary | ICD-10-CM | POA: Diagnosis present

## 2020-02-18 DIAGNOSIS — I110 Hypertensive heart disease with heart failure: Secondary | ICD-10-CM | POA: Diagnosis present

## 2020-02-18 DIAGNOSIS — Z7951 Long term (current) use of inhaled steroids: Secondary | ICD-10-CM | POA: Diagnosis not present

## 2020-02-18 DIAGNOSIS — E119 Type 2 diabetes mellitus without complications: Secondary | ICD-10-CM | POA: Diagnosis present

## 2020-02-18 DIAGNOSIS — M1990 Unspecified osteoarthritis, unspecified site: Secondary | ICD-10-CM | POA: Diagnosis present

## 2020-02-18 DIAGNOSIS — K219 Gastro-esophageal reflux disease without esophagitis: Secondary | ICD-10-CM | POA: Diagnosis present

## 2020-02-18 DIAGNOSIS — F17210 Nicotine dependence, cigarettes, uncomplicated: Secondary | ICD-10-CM | POA: Diagnosis present

## 2020-02-18 DIAGNOSIS — Z8546 Personal history of malignant neoplasm of prostate: Secondary | ICD-10-CM | POA: Diagnosis not present

## 2020-02-18 DIAGNOSIS — R079 Chest pain, unspecified: Secondary | ICD-10-CM

## 2020-02-18 DIAGNOSIS — K2980 Duodenitis without bleeding: Principal | ICD-10-CM

## 2020-02-18 DIAGNOSIS — Z7982 Long term (current) use of aspirin: Secondary | ICD-10-CM | POA: Diagnosis not present

## 2020-02-18 DIAGNOSIS — J449 Chronic obstructive pulmonary disease, unspecified: Secondary | ICD-10-CM | POA: Diagnosis present

## 2020-02-18 DIAGNOSIS — Z7902 Long term (current) use of antithrombotics/antiplatelets: Secondary | ICD-10-CM

## 2020-02-18 DIAGNOSIS — E785 Hyperlipidemia, unspecified: Secondary | ICD-10-CM | POA: Diagnosis present

## 2020-02-18 DIAGNOSIS — Z923 Personal history of irradiation: Secondary | ICD-10-CM | POA: Diagnosis not present

## 2020-02-18 DIAGNOSIS — Z794 Long term (current) use of insulin: Secondary | ICD-10-CM

## 2020-02-18 DIAGNOSIS — I700 Atherosclerosis of aorta: Secondary | ICD-10-CM | POA: Diagnosis present

## 2020-02-18 DIAGNOSIS — K859 Acute pancreatitis without necrosis or infection, unspecified: Secondary | ICD-10-CM | POA: Diagnosis present

## 2020-02-18 DIAGNOSIS — Z79899 Other long term (current) drug therapy: Secondary | ICD-10-CM | POA: Diagnosis not present

## 2020-02-18 DIAGNOSIS — Z803 Family history of malignant neoplasm of breast: Secondary | ICD-10-CM

## 2020-02-18 DIAGNOSIS — R112 Nausea with vomiting, unspecified: Secondary | ICD-10-CM

## 2020-02-18 DIAGNOSIS — E872 Acidosis: Secondary | ICD-10-CM | POA: Diagnosis present

## 2020-02-18 LAB — TROPONIN I (HIGH SENSITIVITY)
Troponin I (High Sensitivity): 18 ng/L — ABNORMAL HIGH (ref <18)
Troponin I (High Sensitivity): 19 ng/L — ABNORMAL HIGH (ref <18)

## 2020-02-18 LAB — CBC
HCT: 35 % — ABNORMAL LOW (ref 39.0–52.0)
Hemoglobin: 11.1 g/dL — ABNORMAL LOW (ref 13.0–17.0)
MCH: 24.8 pg — ABNORMAL LOW (ref 26.0–34.0)
MCHC: 31.7 g/dL (ref 30.0–36.0)
MCV: 78.1 fL — ABNORMAL LOW (ref 80.0–100.0)
Platelets: 232 10*3/uL (ref 150–400)
RBC: 4.48 MIL/uL (ref 4.22–5.81)
RDW: 17.2 % — ABNORMAL HIGH (ref 11.5–15.5)
WBC: 16.2 10*3/uL — ABNORMAL HIGH (ref 4.0–10.5)
nRBC: 0 % (ref 0.0–0.2)

## 2020-02-18 LAB — BASIC METABOLIC PANEL
Anion gap: 10 (ref 5–15)
BUN: 23 mg/dL (ref 8–23)
CO2: 26 mmol/L (ref 22–32)
Calcium: 9 mg/dL (ref 8.9–10.3)
Chloride: 101 mmol/L (ref 98–111)
Creatinine, Ser: 0.75 mg/dL (ref 0.61–1.24)
GFR calc Af Amer: >60 mL/min (ref >60)
GFR calc non Af Amer: >60 mL/min (ref >60)
Glucose, Bld: 225 mg/dL — ABNORMAL HIGH (ref 70–99)
Potassium: 3.9 mmol/L (ref 3.5–5.1)
Sodium: 137 mmol/L (ref 135–145)

## 2020-02-18 LAB — URINALYSIS, COMPLETE (UACMP) WITH MICROSCOPIC
Bacteria, UA: NONE SEEN
Bilirubin Urine: NEGATIVE
Glucose, UA: 150 mg/dL — AB
Hgb urine dipstick: NEGATIVE
Ketones, ur: NEGATIVE mg/dL
Leukocytes,Ua: NEGATIVE
Nitrite: NEGATIVE
Protein, ur: 100 mg/dL — AB
Specific Gravity, Urine: 1.025 (ref 1.005–1.030)
pH: 6 (ref 5.0–8.0)

## 2020-02-18 LAB — LIPASE, BLOOD: Lipase: 38 U/L (ref 11–51)

## 2020-02-18 LAB — HEPATIC FUNCTION PANEL
ALT: 34 U/L (ref 0–44)
AST: 32 U/L (ref 15–41)
Albumin: 4 g/dL (ref 3.5–5.0)
Alkaline Phosphatase: 82 U/L (ref 38–126)
Bilirubin, Direct: 0.1 mg/dL (ref 0.0–0.2)
Indirect Bilirubin: 0.7 mg/dL (ref 0.3–0.9)
Total Bilirubin: 0.8 mg/dL (ref 0.3–1.2)
Total Protein: 7.2 g/dL (ref 6.5–8.1)

## 2020-02-18 LAB — LACTIC ACID, PLASMA
Lactic Acid, Venous: 2.6 mmol/L (ref 0.5–1.9)
Lactic Acid, Venous: 2.3 mmol/L (ref 0.5–1.9)
Lactic Acid, Venous: 2.1 mmol/L (ref 0.5–1.9)

## 2020-02-18 LAB — SARS CORONAVIRUS 2 BY RT PCR (HOSPITAL ORDER, PERFORMED IN ~~LOC~~ HOSPITAL LAB): SARS Coronavirus 2: NEGATIVE

## 2020-02-18 LAB — PROCALCITONIN: Procalcitonin: <0.1 ng/mL

## 2020-02-18 LAB — GLUCOSE, CAPILLARY: Glucose-Capillary: 227 mg/dL — ABNORMAL HIGH (ref 70–99)

## 2020-02-18 MED ORDER — ONDANSETRON 4 MG PO TBDP: 4.0000 mg | ORAL_TABLET | Freq: Three times a day (TID) | ORAL | Status: DC | PRN

## 2020-02-18 MED ORDER — ONDANSETRON HCL 4 MG/2ML IJ SOLN: 4.0000 mg | Freq: Four times a day (QID) | INTRAMUSCULAR | Status: DC | PRN

## 2020-02-18 MED ORDER — HYDROCHLOROTHIAZIDE 25 MG PO TABS: 25.0000 mg | ORAL_TABLET | Freq: Every day | ORAL | Status: DC

## 2020-02-18 MED ORDER — GABAPENTIN 100 MG PO CAPS
200.0000 mg | ORAL_CAPSULE | Freq: Every day | ORAL | Status: DC
  Administered 2020-02-18 – 2020-02-20 (×3): 200 mg via ORAL
  Filled 2020-02-18 (×3): qty 2

## 2020-02-18 MED ORDER — METRONIDAZOLE IN NACL 5-0.79 MG/ML-% IV SOLN
500.0000 mg | Freq: Three times a day (TID) | INTRAVENOUS | Status: DC
  Administered 2020-02-18 – 2020-02-21 (×8): 500 mg via INTRAVENOUS
  Filled 2020-02-18 (×10): qty 100

## 2020-02-18 MED ORDER — CIPROFLOXACIN IN D5W 400 MG/200ML IV SOLN
400.0000 mg | Freq: Two times a day (BID) | INTRAVENOUS | Status: DC
  Administered 2020-02-18 – 2020-02-20 (×5): 400 mg via INTRAVENOUS
  Filled 2020-02-18 (×7): qty 200

## 2020-02-18 MED ORDER — CALCIUM CARBONATE ANTACID 500 MG PO CHEW: 1.0000 | CHEWABLE_TABLET | Freq: Three times a day (TID) | ORAL | Status: DC | PRN

## 2020-02-18 MED ORDER — CLONIDINE HCL 0.1 MG PO TABS: 0.1000 mg | ORAL_TABLET | Freq: Two times a day (BID) | ORAL | Status: DC

## 2020-02-18 MED ORDER — INSULIN GLARGINE 100 UNIT/ML ~~LOC~~ SOLN
15.0000 [IU] | Freq: Every day | SUBCUTANEOUS | Status: DC
  Administered 2020-02-19 – 2020-02-20 (×3): 15 [IU] via SUBCUTANEOUS
  Filled 2020-02-18 (×4): qty 0.15

## 2020-02-18 MED ORDER — IOHEXOL 300 MG/ML  SOLN
100.0000 mL | Freq: Once | INTRAMUSCULAR | Status: AC | PRN
  Administered 2020-02-18: 100 mL via INTRAVENOUS

## 2020-02-18 MED ORDER — SODIUM CHLORIDE 0.9 % IV BOLUS
500.0000 mL | Freq: Once | INTRAVENOUS | Status: AC
  Administered 2020-02-18: 500 mL via INTRAVENOUS

## 2020-02-18 MED ORDER — CLOPIDOGREL BISULFATE 75 MG PO TABS
75.0000 mg | ORAL_TABLET | Freq: Every day | ORAL | Status: DC
  Administered 2020-02-19 – 2020-02-21 (×3): 75 mg via ORAL
  Filled 2020-02-18 (×3): qty 1

## 2020-02-18 MED ORDER — FLUTICASONE FUROATE-VILANTEROL 100-25 MCG/INH IN AEPB
1.0000 | INHALATION_SPRAY | Freq: Every day | RESPIRATORY_TRACT | Status: DC
  Administered 2020-02-19 – 2020-02-21 (×3): 1 via RESPIRATORY_TRACT
  Filled 2020-02-18: qty 28

## 2020-02-18 MED ORDER — METOCLOPRAMIDE HCL 5 MG/ML IJ SOLN
10.0000 mg | Freq: Once | INTRAMUSCULAR | Status: AC
  Administered 2020-02-18: 10 mg via INTRAVENOUS
  Filled 2020-02-18: qty 2

## 2020-02-18 MED ORDER — TIOTROPIUM BROMIDE MONOHYDRATE 18 MCG IN CAPS
18.0000 ug | ORAL_CAPSULE | Freq: Every day | RESPIRATORY_TRACT | Status: DC
  Administered 2020-02-19 – 2020-02-21 (×3): 18 ug via RESPIRATORY_TRACT
  Filled 2020-02-18: qty 5

## 2020-02-18 MED ORDER — CARVEDILOL 6.25 MG PO TABS
3.1250 mg | ORAL_TABLET | Freq: Two times a day (BID) | ORAL | Status: DC
  Administered 2020-02-18 – 2020-02-21 (×6): 3.125 mg via ORAL
  Filled 2020-02-18 (×6): qty 1

## 2020-02-18 MED ORDER — GUAIFENESIN-DM 100-10 MG/5ML PO SYRP
10.0000 mL | ORAL_SOLUTION | Freq: Four times a day (QID) | ORAL | Status: DC | PRN
  Filled 2020-02-18: qty 10

## 2020-02-18 MED ORDER — TRAZODONE HCL 100 MG PO TABS: 100.0000 mg | ORAL_TABLET | Freq: Every evening | ORAL | Status: DC | PRN

## 2020-02-18 MED ORDER — SODIUM CHLORIDE 0.9% FLUSH
3.0000 mL | Freq: Once | INTRAVENOUS | Status: AC
  Administered 2020-02-18: 3 mL via INTRAVENOUS

## 2020-02-18 MED ORDER — ASPIRIN EC 81 MG PO TBEC
81.0000 mg | DELAYED_RELEASE_TABLET | Freq: Every day | ORAL | Status: DC
  Administered 2020-02-19 – 2020-02-20 (×2): 81 mg via ORAL
  Filled 2020-02-18 (×3): qty 1

## 2020-02-18 MED ORDER — ENOXAPARIN SODIUM 40 MG/0.4ML ~~LOC~~ SOLN
40.0000 mg | SUBCUTANEOUS | Status: DC
  Administered 2020-02-18 – 2020-02-20 (×3): 40 mg via SUBCUTANEOUS
  Filled 2020-02-18 (×3): qty 0.4

## 2020-02-18 MED ORDER — INSULIN ASPART 100 UNIT/ML ~~LOC~~ SOLN
0.0000 [IU] | Freq: Three times a day (TID) | SUBCUTANEOUS | Status: DC
  Administered 2020-02-19: 5 [IU] via SUBCUTANEOUS
  Administered 2020-02-19: 2 [IU] via SUBCUTANEOUS
  Administered 2020-02-19: 3 [IU] via SUBCUTANEOUS
  Administered 2020-02-20: 2 [IU] via SUBCUTANEOUS
  Administered 2020-02-20: 8 [IU] via SUBCUTANEOUS
  Administered 2020-02-20 – 2020-02-21 (×2): 2 [IU] via SUBCUTANEOUS
  Administered 2020-02-21: 5 [IU] via SUBCUTANEOUS
  Filled 2020-02-18 (×8): qty 1

## 2020-02-18 MED ORDER — DOCUSATE SODIUM 100 MG PO CAPS
100.0000 mg | ORAL_CAPSULE | Freq: Two times a day (BID) | ORAL | Status: DC | PRN
  Filled 2020-02-18: qty 1

## 2020-02-18 MED ORDER — POLYETHYLENE GLYCOL 3350 17 G PO PACK: 17.0000 g | PACK | Freq: Two times a day (BID) | ORAL | Status: DC | PRN

## 2020-02-18 MED ORDER — MORPHINE SULFATE (PF) 2 MG/ML IV SOLN
2.0000 mg | Freq: Once | INTRAVENOUS | Status: AC
  Administered 2020-02-18: 2 mg via INTRAVENOUS
  Filled 2020-02-18: qty 1

## 2020-02-18 MED ORDER — LIDOCAINE 5 % EX PTCH: 2.0000 | MEDICATED_PATCH | CUTANEOUS | Status: DC

## 2020-02-18 MED ORDER — ALUM & MAG HYDROXIDE-SIMETH 200-200-20 MG/5ML PO SUSP: 15.0000 mL | Freq: Four times a day (QID) | ORAL | Status: DC | PRN

## 2020-02-18 MED ORDER — ATORVASTATIN CALCIUM 20 MG PO TABS
80.0000 mg | ORAL_TABLET | Freq: Every day | ORAL | Status: DC
  Administered 2020-02-19 – 2020-02-21 (×3): 80 mg via ORAL
  Filled 2020-02-18 (×3): qty 4

## 2020-02-18 MED ORDER — ACETAMINOPHEN 500 MG PO TABS: 1000.0000 mg | ORAL_TABLET | Freq: Three times a day (TID) | ORAL | Status: DC | PRN

## 2020-02-18 MED ORDER — PANTOPRAZOLE SODIUM 40 MG PO TBEC
40.0000 mg | DELAYED_RELEASE_TABLET | Freq: Two times a day (BID) | ORAL | Status: DC
  Administered 2020-02-18 – 2020-02-21 (×6): 40 mg via ORAL
  Filled 2020-02-18 (×6): qty 1

## 2020-02-18 NOTE — ED Triage Notes (Signed)
Pt comes into the ED via EMS from home with c/o chest pain with N/V today, EMS gave 324mg  of ASA and states pt vomited immediately after and pills where noted, was given another 324mg  of ASA and 4mg  of zofran IV.

## 2020-02-18 NOTE — ED Notes (Signed)
Marzetta Board, RN updated on new lactic acid result and informed that [place], NP was paged to report critical lab value and still awaiting a response.

## 2020-02-18 NOTE — ED Notes (Signed)
Lab calls and reports critical Lactic Acid of 2.1. Stark Klein, NP paged to report critical value. Pt still awaiting transport to go to floor

## 2020-02-18 NOTE — H&P (Signed)
History and Physical    Nathaniel Little BZJ:[id number] DOB: Nov 05, 1945 DOA: 02/18/2020  PCP: Verl Bangs, FNP  Patient coming from: home  I have personally briefly reviewed patient's old medical records in [place]  Chief Complaint: chest pain and abdominal pain  HPI: Nathaniel Little is a 74 y.o. male with medical history as listed below who presented with sudden onset chest pain and abdominal pain.   Pt reported waking up with chest pain (epigastric area) and abdominal pain (lower abdomen) around 7 am this morning.  No N/V/D.  The only vomiting happened when the EMS was giving him ASA 365 on route to the hospital.  Pt said he always has minor dyspnea due to his severe aortic valve disease.  Pt denied constipation, and had a normal BM yesterday.  Pt reported night sweats, but no fever or chills.  Good urine output and no dysuria.  ED Course: initial vitals: afebrile, pulse 75, BP 151/70, sating 95% on room air.   Labs notable for WBC 16.2, Hgb 11.1 (about baseline), trop 18 and 19, lactic acid 2.6, lipase wnl.  EKG sinus with no ACS-related changes.  CT ab/pelvis showed "considerable peripancreatic impaired duodenal inflammatory change likely representing some acute pancreatitis/duodenitis."  Pt received NS 500 ml and IV morphine x1 in the ED before admission.   Assessment/Plan Active Problems:   Duodenitis  # Duodenitis # Lactic acidosis --presented with abdominal pain, elevated WBC, lactic acidosis.  CT a/p showed findings concerning for pancreatitis/duodenitis.  Since lipase is wnl, pt more likely has duodenitis. PLAN: --obtain procal --start cipro/flagyl --clear liquid diet for now --recheck lactic acid.  # DM2 on insulin  --continue Lantus at reduced 15u nightly --SSI TID (no bed-time coverage unless pt eats a meal at bed-time)  # COPD, stable # current smoker --continue home daily bronchodilators as Breo and Incruse  # HTN --continue home coreg, and HCTZ 25 mg  daily --Per recent outpatient cards note on 02/08/20, clonidine d/c'ed.  # Diastolic CHF --continue HCTZ as diuretic  # Severe AS --Pending outpatient TAVR  # PAD s/p stent  --continue ASA, lipitor and plavix  # OSA on CPAP --continue CPAP nightly  # GERD --continue home protonix  # prostate CA s/p radiation therapy  # H/o pancreatic mass / IPMN s/p Whipple procedure 07/20/2018   DVT prophylaxis: Lovenox SQ Code Status: Full code  Family Communication:   Disposition Plan: home  Consults called: none Admission status: Inpatient   Review of Systems: As per HPI otherwise 10 point review of systems negative.   Past Medical History:  Diagnosis Date  . (HFpEF) heart failure with preserved ejection fraction ([place])    a. 08/2017 Echo: EF 60-65%, Gr1 DD.  [name] Arthritis   . Chest pain    a. 08/2017 Lexiscan MV: EF 53%, no ischemia-->Low risk.  [name] COPD (chronic obstructive pulmonary disease) ([place])   . Depression   . Diabetes mellitus without complication ([place])   . Dyspnea   . Heart murmur   . History of orthopnea   . Hypertension   . Lymphedema   . Moderate aortic stenosis    a. 08/2017 Echo: EF 60-65%, no rwma, GR1 DD, Mod Ao Stenosis, mean grad 61mHg. Valve area (VTI): 1.04 cm^2. Mildly dil LA.  . Morbid obesity ([place])   . Prostate cancer ([place]) 03/2013   ? PANCREATIC  . Sleep apnea   . Sleep apnea with use of continuous positive airway pressure (CPAP)   . Tobacco abuse   .  Tremor    HEAD  . Wheezing    OCCAS    Past Surgical History:  Procedure Laterality Date  . ADENOIDECTOMY    . APPENDECTOMY    . CATARACT EXTRACTION W/PHACO Left 11/28/2018   Procedure: CATARACT EXTRACTION PHACO AND INTRAOCULAR LENS PLACEMENT (IOC) LEFT, DIABETIC;  Surgeon: Birder Robson, MD;  Location: [facility] ORS;  Service: Ophthalmology;  Laterality: Left;  [name]  00:56 CDE 9.44 Fluid pack lot # [id number]  . EUS N/A 10/13/2017   Procedure: FULL UPPER ENDOSCOPIC ULTRASOUND (EUS) RADIAL;  Surgeon:  Holly Bodily, MD;  Location: [facility] ENDOSCOPY;  Service: Gastroenterology;  Laterality: N/A;  . PANCREATICODUODENECTOMY  07/23/2018  . TONSILLECTOMY       reports that he has been smoking cigarettes. He has a 17.00 pack-year smoking history. He quit smokeless tobacco use about 21 years ago.  His smokeless tobacco use included chew and snuff. He reports previous alcohol use. He reports that he does not use drugs.  Allergies  Allergen Reactions  . Codeine Itching    Family History  Problem Relation Age of Onset  . Breast cancer Mother   . Cancer Father        Black Lung  . Cerebral palsy Brother   . Tuberculosis Paternal Uncle   . Prostate cancer Neg Hx   . Kidney cancer Neg Hx   . Bladder Cancer Neg Hx      Prior to Admission medications   Medication Sig Start Date End Date Taking? Authorizing Provider  albuterol (PROVENTIL) (2.5 MG/3ML) 0.083% nebulizer solution Take 2.5 mg by nebulization every 6 (six) hours as needed for wheezing or shortness of breath.    [provider]  albuterol (VENTOLIN HFA) 108 (90 Base) MCG/ACT inhaler Inhale 2 puffs into the lungs every 6 (six) hours as needed for wheezing or shortness of breath. 08/22/19   Karamalegos, Devonne Doughty, DO  aspirin EC 81 MG tablet Take 81 mg by mouth daily.    [provider]  atorvastatin (LIPITOR) 80 MG tablet Take 1 tablet (80 mg total) by mouth daily. 02/11/20 05/11/20  Marrianne Mood D, PA-C  carvedilol (COREG) 3.125 MG tablet Take 1 tablet (3.125 mg total) by mouth 2 (two) times daily. 02/08/20 05/08/20  Marrianne Mood D, PA-C  cloNIDine (CATAPRES) 0.1 MG tablet TAKE 1 TABLET BY MOUTH TWICE DAILY 01/07/20   [name], [name], FNP  clopidogrel (PLAVIX) 75 MG tablet TAKE 1 TABLET BY MOUTH ONCE DAILY 02/05/20   [name], [name], FNP  diclofenac (VOLTAREN) 75 MG EC tablet TAKE 1 TABLET BY MOUTH TWICE DAILY WITH FOOD AFTER 14 DAYS TAKE ONLY AS NEEDED 01/08/20   [name], [name], FNP  diclofenac Sodium  (VOLTAREN) 1 % GEL Apply 2 g topically 4 (four) times daily. 10/14/19   Johnn Hai, PA-C  docusate sodium (COLACE) 100 MG capsule Take 100 mg by mouth daily as needed for mild constipation.    [provider]  Ferrous Sulfate (IRON SUPPLEMENT PO) Take by mouth.    [provider]  gabapentin (NEURONTIN) 100 MG capsule Take 200 mg by mouth at bedtime.  01/29/19   [provider]  hydrochlorothiazide (HYDRODIURIL) 25 MG tablet Take 1 tablet (25 mg total) by mouth daily. 02/08/20   Marrianne Mood D, PA-C  insulin glargine (LANTUS) 100 unit/mL SOPN Inject 20 Units into the skin at bedtime.    [provider]  ipratropium-albuterol (DUONEB) 0.5-2.5 (3) MG/3ML SOLN Take 3 mLs by nebulization every 6 (six) hours  as needed (wheezing, shortness of breath). 04/20/17   [name], NP  metFORMIN (GLUCOPHAGE) 500 MG tablet Take 1,000 mg by mouth 2 (two) times daily.  11/04/18   [provider]  pantoprazole (PROTONIX) 40 MG tablet Take 1 tablet (40 mg total) by mouth 2 (two) times daily. 12/06/19 12/05/20  [name], [name], FNP  sildenafil (REVATIO) 20 MG tablet Take 1-5 pills about 30 min prior to sex. Start with 1 and increase as needed. Patient not taking: Reported on 02/01/2020 08/13/19   Olin Hauser, DO  SYMBICORT 80-4.5 MCG/ACT inhaler INHALE 2 PUFFS BY MOUTH TWICE DAILY 08/17/19   Parks Ranger, Devonne Doughty, DO  tiotropium (SPIRIVA HANDIHALER) 18 MCG inhalation capsule Place 1 capsule (18 mcg total) into inhaler and inhale daily. 11/20/18   [name], NP  traZODone (DESYREL) 100 MG tablet Take 100 mg by mouth at bedtime as needed for sleep.    [provider]    Physical Exam: Vitals:   02/18/20 1800 02/18/20 1830 02/18/20 1900 02/18/20 2030  BP: 126/70 (!) 148/75 (!) 141/74 (!) 146/76  Pulse: 72 70 71 72  Resp: 19 20  20   Temp:      TempSrc:      SpO2: 93% 97% 96% 94%    Constitutional: NAD, AAOx3 HEENT:  conjunctivae and lids normal, EOMI CV: RRR 4+ systolic murmur at right upper sternal boarder. Distal pulses +2.  No cyanosis.   RESP: CTA B/L, normal respiratory effort  GI: BS difficult to appreciate, large abdomen, diffusely tender to palpation  Extremities: No effusions, edema, or tenderness in BLE SKIN: warm, dry and intact Neuro: II - XII grossly intact.  Sensation intact Psych: Normal mood and affect.  Appropriate judgement and reason   Labs on Admission: I have personally reviewed following labs and imaging studies  CBC: Recent Labs  Lab 02/18/20 1103  WBC 16.2*  HGB 11.1*  HCT 35.0*  MCV 78.1*  PLT 379   Basic Metabolic Panel: Recent Labs  Lab 02/18/20 1103  NA 137  K 3.9  CL 101  CO2 26  GLUCOSE 225*  BUN 23  CREATININE 0.75  CALCIUM 9.0   GFR: Estimated Creatinine Clearance: 93.7 mL/min (by C-G formula based on SCr of 0.75 mg/dL). Liver Function Tests: Recent Labs  Lab 02/18/20 1340  AST 32  ALT 34  ALKPHOS 82  BILITOT 0.8  PROT 7.2  ALBUMIN 4.0   Recent Labs  Lab 02/18/20 1340  LIPASE 38   No results for input(s): AMMONIA in the last 168 hours. Coagulation Profile: No results for input(s): INR, PROTIME in the last 168 hours. Cardiac Enzymes: No results for input(s): CKTOTAL, CKMB, CKMBINDEX, TROPONINI in the last 168 hours. BNP (last 3 results) No results for input(s): PROBNP in the last 8760 hours. HbA1C: No results for input(s): HGBA1C in the last 72 hours. CBG: No results for input(s): GLUCAP in the last 168 hours. Lipid Profile: No results for input(s): CHOL, HDL, LDLCALC, TRIG, CHOLHDL, LDLDIRECT in the last 72 hours. Thyroid Function Tests: No results for input(s): TSH, T4TOTAL, FREET4, T3FREE, THYROIDAB in the last 72 hours. Anemia Panel: No results for input(s): VITAMINB12, FOLATE, FERRITIN, TIBC, IRON, RETICCTPCT in the last 72 hours. Urine analysis:    Component Value Date/Time   COLORURINE YELLOW (A) 02/18/2020 1447    APPEARANCEUR CLEAR (A) 02/18/2020 1447   LABSPEC 1.025 02/18/2020 1447   PHURINE 6.0 02/18/2020 1447   GLUCOSEU 150 (A) 02/18/2020 1447   HGBUR NEGATIVE  02/18/2020 [place] 02/18/2020 [place] 02/18/2020 1447   PROTEINUR 100 (A) 02/18/2020 1447   NITRITE NEGATIVE 02/18/2020 [place] 02/18/2020 1447    Radiological Exams on Admission: DG Chest 2 View  Result Date: 02/18/2020 CLINICAL DATA:  Chest pain with nausea and vomiting. EXAM: CHEST - 2 VIEW COMPARISON:  09/07/2018 FINDINGS: The cardiomediastinal silhouette is unchanged with normal heart size. The lungs are borderline hyperinflated with unchanged mild atelectasis or scarring in the left greater than right lung bases. No pulmonary edema, pleural effusion, pneumothorax is identified. No acute osseous abnormality is seen. IMPRESSION: Bibasilar atelectasis or scarring. No evidence of active cardiopulmonary disease. Electronically Signed   By: Logan Bores M.D.   On: 02/18/2020 11:47   CT ABDOMEN PELVIS W CONTRAST  Result Date: 02/18/2020 CLINICAL DATA:  Nausea and vomiting with chest pain EXAM: CT ABDOMEN AND PELVIS WITH CONTRAST TECHNIQUE: Multidetector CT imaging of the abdomen and pelvis was performed using the standard protocol following bolus administration of intravenous contrast. CONTRAST:  134mL OMNIPAQUE IOHEXOL 300 MG/ML  SOLN COMPARISON:  None. FINDINGS: Lower chest: No acute abnormality. Hepatobiliary: Gallbladder has been surgically removed. Fatty infiltration of the liver is noted. Biliary stent is noted in place extending into the duodenum. Pancreas: Changes of prior pancreatic surgery are again seen. Increased edema within the residual pancreas is noted. Additionally there is increased peripancreatic fluid suspicious for focal pancreatitis. Alternatively these changes could be related to duodenal inflammation with secondary involvement of the pancreas. Correlation with  laboratory values is recommended. Spleen: Normal in size without focal abnormality. Adrenals/Urinary Tract: Adrenal glands are within normal limits. Stable scattered cysts are noted within the kidneys bilaterally. No renal calculi are seen. No obstructive changes are noted. The bladder is well distended. Stomach/Bowel: No obstructive or inflammatory changes of the colon are seen. Postsurgical changes are noted in the stomach consistent with the prior partial pancreatectomy. Inflammatory changes are noted surrounding the anastomosis between the biliary tree and duodenum with biliary stent in place. This may represent some focal duodenal inflammation but as described above pancreatic inflammation deserves consideration as well. Vascular/Lymphatic: Aortic atherosclerosis. No enlarged abdominal or pelvic lymph nodes. Previously seen peripancreatic and periportal soft tissue densities have resolved in the interval. Reproductive: Prostate is unremarkable. Other: No abdominal wall hernia or abnormality. No abdominopelvic ascites. Musculoskeletal: No acute or significant osseous findings. IMPRESSION: Changes of prior pancreatic surgery. The previously seen fluid collection adjacent to the pancreas has resolved. There is however now considerable peripancreatic impaired duodenal inflammatory change likely representing some acute pancreatitis/duodenitis. No other focal abnormality is noted. Electronically Signed   By: Inez Catalina M.D.   On: 02/18/2020 16:03      Enzo Bi MD [facility]  If 7PM-7AM, please contact night-coverage 02/18/2020, 8:56 PM

## 2020-02-18 NOTE — ED Notes (Signed)
Transportation set up for pt to floor.

## 2020-02-18 NOTE — ED Provider Notes (Signed)
[facility] Emergency Department Provider Note ____________________________________________   First MD Initiated Contact with Patient 02/18/20 1406     (approximate)  I have reviewed the triage vital signs and the nursing notes.   HISTORY  Chief Complaint Chest Pain    HPI Kaimani Clayson is a 74 y.o. male with PMH as noted below including CHF, chest pain, and aortic stenosis who presents with chest pain acute onset this morning, described as squeezing, substernal in location, and associated with some shortness of breath.  The chest pain has now subsided after he got aspirin by EMS.  The patient subsequently developed nausea and vomiting, initially vomiting after being given aspirin by EMS (which was redosed) and with another episode of vomiting while waiting in the ER before I saw him.  He reports epigastric abdominal pain that is somewhat worse on the left.  He denies any diarrhea, and states his last bowel movement the night last night was normal.  He denies any urinary symptoms.   Past Medical History:  Diagnosis Date  . (HFpEF) heart failure with preserved ejection fraction (DISH)    a. 08/2017 Echo: EF 60-65%, Gr1 DD.  [name] Arthritis   . Chest pain    a. 08/2017 Lexiscan MV: EF 53%, no ischemia-->Low risk.  [name] COPD (chronic obstructive pulmonary disease) ([place])   . Depression   . Diabetes mellitus without complication ([place])   . Dyspnea   . Heart murmur   . History of orthopnea   . Hypertension   . Lymphedema   . Moderate aortic stenosis    a. 08/2017 Echo: EF 60-65%, no rwma, GR1 DD, Mod Ao Stenosis, mean grad 54mg. Valve area (VTI): 1.04 cm^2. Mildly dil LA.  . Morbid obesity ([place]   . [place]state cancer [place] 03/2013   ? PANCREATIC  . Sleep apnea   . Sleep apnea with use of continuous positive airway pressure (CPAP)   . Tobacco abuse   . Tremor    HEAD  . Wheezing    OCCAS    Patient Active Problem List   Diagnosis Date Noted  . Diabetes ([place]  12/06/2019  . Increased frequency of urination 12/06/2019  . Erectile dysfunction due to arterial insufficiency 09/24/2019  . Lumbar degenerative disc disease 06/25/2019  . Sacroiliac joint dysfunction of both sides 06/25/2019  . Moderate aortic stenosis 01/19/2019  . Chronic heart failure with preserved ejection fraction ([place] 01/19/2019  . Tobacco abuse 01/19/2019  . Hyperlipidemia 07/20/2018  . IPMN (intraductal papillary mucinous neoplasm) 12/24/2017  . Claudication in peripheral vascular disease ([place] 08/22/2017  . Chest pain 08/22/2017  . Pancreatic mass 08/20/2017  . Aortic atherosclerosis ([place] 07/25/2017  . BMI 38.0-38.9,adult 02/09/2017  . Arthritis pain of hip 01/07/2017  . Chronic obstructive pulmonary disease ([place] 01/07/2017  . GERD (gastroesophageal reflux disease) 01/07/2017  . Inability to attain erection 01/07/2017  . Tobacco smoker within last 12 months 01/07/2017  . Essential hypertension 01/07/2017  . Heart murmur, systolic 075/17/0017 . Depression 01/06/2017  . Sleep apnea with use of continuous positive airway pressure (CPAP) 01/06/2017  . Personal history of prostate cancer 03/04/2013    Past Surgical History:  Procedure Laterality Date  . ADENOIDECTOMY    . APPENDECTOMY    . CATARACT EXTRACTION W/PHACO Left 11/28/2018   Procedure: CATARACT EXTRACTION PHACO AND INTRAOCULAR LENS PLACEMENT (IOC) LEFT, DIABETIC;  Surgeon: PBirder Robson MD;  Location: [facility] ORS;  Service: Ophthalmology;  Laterality: Left;  [place] 00:56 CDE 9.44 Fluid pack  lot # [id number]  . EUS N/A 10/13/2017   Procedure: FULL UPPER ENDOSCOPIC ULTRASOUND (EUS) RADIAL;  Surgeon: Holly Bodily, MD;  [facility] ENDOSCOPY;  Service: Gastroenterology;  Laterality: N/A;  . PANCREATICODUODENECTOMY  07/23/2018  . TONSILLECTOMY      Prior to Admission medications   Medication Sig Start Date End Date Taking? Authorizing Provider  ACCU-CHEK AVIVA PLUS test strip  05/24/19   [provider]  albuterol (PROVENTIL) (2.5 MG/3ML) 0.083% nebulizer solution Take 2.5 mg by nebulization every 6 (six) hours as needed for wheezing or shortness of breath.    [provider]  albuterol (VENTOLIN HFA) 108 (90 Base) MCG/ACT inhaler Inhale 2 puffs into the lungs every 6 (six) hours as needed for wheezing or shortness of breath. 08/22/19   Karamalegos, Devonne Doughty, DO  aspirin EC 81 MG tablet Take 81 mg by mouth daily.    [provider]  atorvastatin (LIPITOR) 80 MG tablet Take 1 tablet (80 mg total) by mouth daily. 02/11/20 05/11/20  Marrianne Mood D, PA-C  blood glucose meter kit and supplies Dispense based on patient and insurance preference. Use up to four times daily as directed. (FOR ICD-10 E10.9, E11.9). 08/18/18   [name], NP  carvedilol (COREG) 3.125 MG tablet Take 1 tablet (3.125 mg total) by mouth 2 (two) times daily. 02/08/20 05/08/20  Marrianne Mood D, PA-C  cloNIDine (CATAPRES) 0.1 MG tablet TAKE 1 TABLET BY MOUTH TWICE DAILY 01/07/20   [name], [name], FNP  clopidogrel (PLAVIX) 75 MG tablet TAKE 1 TABLET BY MOUTH ONCE DAILY 02/05/20   [name], [name], FNP  diclofenac (VOLTAREN) 75 MG EC tablet TAKE 1 TABLET BY MOUTH TWICE DAILY WITH FOOD AFTER 14 DAYS TAKE ONLY AS NEEDED 01/08/20   [name], [name], FNP  diclofenac Sodium (VOLTAREN) 1 % GEL Apply 2 g topically 4 (four) times daily. 10/14/19   Johnn Hai, PA-C  docusate sodium (COLACE) 100 MG capsule Take 100 mg by mouth daily as needed for mild constipation.    [provider]  Ferrous Sulfate (IRON SUPPLEMENT PO) Take by mouth.    [provider]  gabapentin (NEURONTIN) 100 MG capsule Take 200 mg by mouth at bedtime.  01/29/19   [provider]  hydrochlorothiazide (HYDRODIURIL) 25 MG tablet Take 1 tablet (25 mg total) by mouth daily. 02/08/20   Marrianne Mood D, PA-C  insulin glargine (LANTUS) 100 unit/mL SOPN Inject 20 Units into the skin at bedtime.    [provider]  ipratropium-albuterol (DUONEB) 0.5-2.5 (3) MG/3ML SOLN Take 3 mLs by nebulization every 6 (six) hours as needed (wheezing, shortness of breath). 04/20/17   [name], NP  metFORMIN (GLUCOPHAGE) 500 MG tablet Take 1,000 mg by mouth 2 (two) times daily.  11/04/18   [provider]  pantoprazole (PROTONIX) 40 MG tablet Take 1 tablet (40 mg total) by mouth 2 (two) times daily. 12/06/19 12/05/20  [name], [name], FNP  sildenafil (REVATIO) 20 MG tablet Take 1-5 pills about 30 min prior to sex. Start with 1 and increase as needed. Patient not taking: Reported on 02/01/2020 08/13/19   Olin Hauser, DO  SYMBICORT 80-4.5 MCG/ACT inhaler INHALE 2 PUFFS BY MOUTH TWICE DAILY 08/17/19   Parks Ranger, Devonne Doughty, DO  tiotropium (SPIRIVA HANDIHALER) 18 MCG inhalation capsule Place 1 capsule (18 mcg total) into inhaler and inhale daily. 11/20/18   [name], NP  traZODone (DESYREL) 100 MG tablet Take 100 mg by mouth  at bedtime as needed for sleep.    [provider]    Allergies Codeine  Family History  Problem Relation Age of Onset  . Breast cancer Mother   . Cancer Father        Black Lung  . Cerebral palsy Brother   . Tuberculosis Paternal Uncle   . Prostate cancer Neg Hx   . Kidney cancer Neg Hx   . Bladder Cancer Neg Hx     Social History Social History   Tobacco Use  . Smoking status: Current Some Day Smoker    Packs/day: 0.25    Years: 68.00    Pack years: 17.00    Types: Cigarettes  . Smokeless tobacco: Former Systems developer    Types: Chew, [place] date: 2000  . Tobacco comment: 5 cigs daily 01/29/20/ER  Substance Use Topics  . Alcohol use: Not Currently    Alcohol/week: 0.0 - 1.0 standard drinks    Comment: quit 40years ago  . Drug use: No    Review of Systems  Constitutional: No fever. Eyes: No redness. ENT: No sore throat. Cardiovascular: Denies chest pain. Respiratory: Denies shortness of breath. Gastrointestinal:  Positive for nausea and vomiting. Genitourinary: Negative for dysuria.  Musculoskeletal: Negative for back pain. Skin: Negative for rash. Neurological: Negative for headache.   ____________________________________________   PHYSICAL EXAM:  VITAL SIGNS: ED Triage Vitals [02/18/20 1058]  Enc Vitals Group     BP (!) 151/70     Pulse Rate 75     Resp 19     Temp 98.5 F (36.9 C)     Temp Source Oral     SpO2 95 %     Weight      Height      Head Circumference      Peak Flow      Pain Score      Pain Loc      Pain Edu?      Excl. in [place]?     Constitutional: Alert and oriented.  Uncomfortable appearing but in no acute distress. Eyes: Conjunctivae are normal.  No scleral icterus. Head: Atraumatic. Nose: No congestion/rhinnorhea. Mouth/Throat: Mucous membranes are moist.   Neck: Normal range of motion.  Cardiovascular: Normal rate, regular rhythm.  Systolic murmur.  Good peripheral circulation. Respiratory: Normal respiratory effort.  No retractions. Lungs CTAB. Gastrointestinal: Soft with mild epigastric and left upper quadrant tenderness.  No distention.  Genitourinary: No flank tenderness. Musculoskeletal: No lower extremity edema.  Extremities warm and well perfused.  Neurologic:  Normal speech and language. No gross focal neurologic deficits are appreciated.  Skin:  Skin is warm and dry. No rash noted. Psychiatric: Mood and affect are normal. Speech and behavior are normal.  ____________________________________________   LABS (all labs ordered are listed, but only abnormal results are displayed)  Labs Reviewed  BASIC METABOLIC PANEL - Abnormal; Notable for the following components:      Result Value   Glucose, Bld 225 (*)    All other components within normal limits  CBC - Abnormal; Notable for the following components:   WBC 16.2 (*)    Hemoglobin 11.1 (*)    HCT 35.0 (*)    MCV 78.1 (*)    MCH 24.8 (*)    RDW 17.2 (*)    All other components within normal  limits  TROPONIN I (HIGH SENSITIVITY) - Abnormal; Notable for the following components:   Troponin I (High Sensitivity) 18 (*)    All  other components within normal limits  TROPONIN I (HIGH SENSITIVITY) - Abnormal; Notable for the following components:   Troponin I (High Sensitivity) 19 (*)    All other components within normal limits  SARS CORONAVIRUS 2 BY RT PCR (HOSPITAL ORDER, [facility])  LACTIC ACID, PLASMA  LACTIC ACID, PLASMA  URINALYSIS, COMPLETE (UACMP) WITH MICROSCOPIC  LIPASE, BLOOD  HEPATIC FUNCTION PANEL   ____________________________________________  EKG  ED ECG REPORT I, Arta Silence, the attending physician, personally viewed and interpreted this ECG.  Date: 02/18/2020 EKG Time: 1052 Rate: 74 Rhythm: normal sinus rhythm QRS Axis: Left axis Intervals: normal ST/T Wave abnormalities: Nonspecific lateral T wave abnormality Narrative Interpretation: Nonspecific T wave abnormalities laterally slightly more prominent when compared to EKG of 02/08/2020  ____________________________________________  RADIOLOGY  CXR: Bibasilar atelectasis with no focal infiltrate or other acute abnormality CT abdomen: Pending  ____________________________________________   PROCEDURES  Procedure(s) performed: No  Procedures  Critical Care performed: No ____________________________________________   INITIAL IMPRESSION / ASSESSMENT AND PLAN / ED COURSE  Pertinent labs & imaging results that were available during my care of the patient were reviewed by me and considered in my medical decision making (see chart for details).  74 year old male with PMH as noted above including CHF, COPD, aortic stenosis, diabetes presents with nonexertional chest pain and shortness of breath since this morning, followed by nausea, vomiting, and upper abdominal pain.  I reviewed the past medical records in epic.  The patient follows with [facility] cardiology.   Besides the above history, the patient has some history of peripheral artery disease, stenting of the gastroduodenal artery, and history of pancreatic mass status post [place] in 2019.  Recent echocardiogram shows severe aortic stenosis.  He is scheduled for a cardiac catheterization later this week.  On exam, the patient is overall relatively well-appearing although somewhat uncomfortable.  His vital signs are normal.  The physical exam is significant for mild upper abdominal tenderness.  He has no peripheral edema, no respiratory distress or increased work of breathing, and his lungs are clear.  He has a significant systolic murmur which has been noted on prior charts.  Initial lab work-up reveals minimally elevated troponin, as well as elevated WBC count.  EKG shows some slightly more prominent lateral T wave inversions.  In terms of the chest pain and shortness of breath, I have a low suspicion for ACS.  I suspect overall it is related to his aortic stenosis and/or CHF.  The patient has no documented history of CAD, and the troponin is minimally elevated and not rising, more consistent with demand ischemia.  There is no clinical evidence for aortic dissection or other vascular etiology, or for PE.  He is anticoagulated.  Differential for the nausea and vomiting includes ACS, gastritis, gastroenteritis, or less likely hepatobiliary etiology.  Given the predominance of vomiting, the pain not out of proportion to exam, and the reassuring vital signs, I have a low suspicion for acute vascular etiology.  I have added on a urinalysis, LFTs, lipase, lactate, and we will obtain a CT abdomen for further evaluation.  I anticipate that given the patient's elevated cardiac risk and borderline troponin, he may require admission.  ----------------------------------------- 3:15 PM on 02/18/2020 -----------------------------------------  CT and additional labs are pending.  I signed the patient out to the  oncoming physician Dr. Joni Fears.  ____________________________________________   FINAL CLINICAL IMPRESSION(S) / ED DIAGNOSES  Final diagnoses:  Chest pain, unspecified type  Non-intractable vomiting with  nausea, unspecified vomiting type      NEW MEDICATIONS STARTED DURING THIS VISIT:  New Prescriptions   No medications on file     Note:  This document was prepared using Dragon voice recognition software and may include unintentional dictation errors.    Arta Silence, MD 02/18/20 1515

## 2020-02-19 LAB — CBC
HCT: 33.8 % — ABNORMAL LOW (ref 39.0–52.0)
Hemoglobin: 10.6 g/dL — ABNORMAL LOW (ref 13.0–17.0)
MCH: 24.5 pg — ABNORMAL LOW (ref 26.0–34.0)
MCHC: 31.4 g/dL (ref 30.0–36.0)
MCV: 78.2 fL — ABNORMAL LOW (ref 80.0–100.0)
Platelets: 215 10*3/uL (ref 150–400)
RBC: 4.32 MIL/uL (ref 4.22–5.81)
RDW: 17.1 % — ABNORMAL HIGH (ref 11.5–15.5)
WBC: 10 10*3/uL (ref 4.0–10.5)
nRBC: 0 % (ref 0.0–0.2)

## 2020-02-19 LAB — BASIC METABOLIC PANEL
Anion gap: 8 (ref 5–15)
BUN: 17 mg/dL (ref 8–23)
CO2: 26 mmol/L (ref 22–32)
Calcium: 8.7 mg/dL — ABNORMAL LOW (ref 8.9–10.3)
Chloride: 101 mmol/L (ref 98–111)
Creatinine, Ser: 0.71 mg/dL (ref 0.61–1.24)
GFR calc Af Amer: >60 mL/min (ref >60)
GFR calc non Af Amer: >60 mL/min (ref >60)
Glucose, Bld: 162 mg/dL — ABNORMAL HIGH (ref 70–99)
Potassium: 3.7 mmol/L (ref 3.5–5.1)
Sodium: 135 mmol/L (ref 135–145)

## 2020-02-19 LAB — GLUCOSE, CAPILLARY
Glucose-Capillary: 141 mg/dL — ABNORMAL HIGH (ref 70–99)
Glucose-Capillary: 223 mg/dL — ABNORMAL HIGH (ref 70–99)
Glucose-Capillary: 153 mg/dL — ABNORMAL HIGH (ref 70–99)
Glucose-Capillary: 273 mg/dL — ABNORMAL HIGH (ref 70–99)

## 2020-02-19 LAB — PROCALCITONIN: Procalcitonin: <0.1 ng/mL

## 2020-02-19 LAB — MAGNESIUM: Magnesium: 1.4 mg/dL — ABNORMAL LOW (ref 1.7–2.4)

## 2020-02-19 MED ORDER — INSULIN ASPART 100 UNIT/ML ~~LOC~~ SOLN
0.0000 [IU] | Freq: Every day | SUBCUTANEOUS | Status: DC
  Administered 2020-02-19 – 2020-02-20 (×2): 3 [IU] via SUBCUTANEOUS
  Filled 2020-02-19 (×2): qty 1

## 2020-02-19 MED ORDER — MAGNESIUM SULFATE 2 GM/50ML IV SOLN
2.0000 g | INTRAVENOUS | Status: AC
  Administered 2020-02-19 (×2): 2 g via INTRAVENOUS
  Filled 2020-02-19 (×2): qty 50

## 2020-02-19 MED ORDER — INSULIN ASPART 100 UNIT/ML ~~LOC~~ SOLN
4.0000 [IU] | Freq: Three times a day (TID) | SUBCUTANEOUS | Status: DC
  Administered 2020-02-19 – 2020-02-21 (×6): 4 [IU] via SUBCUTANEOUS
  Filled 2020-02-19 (×6): qty 1

## 2020-02-19 NOTE — Progress Notes (Signed)
PROGRESS NOTE    Kimsey Demaree  ZOX:[id number] DOB: 09/08/1946 DOA: 02/18/2020 PCP: Verl Bangs, FNP    Assessment & Plan:   Active Problems:   Duodenitis    Nathaniel Little is a 74 y.o. male with medical history as listed below who presented with sudden onset chest pain and abdominal pain.    # Duodenitis # Lactic acidosis --presented with abdominal pain, elevated WBC, lactic acidosis.  CT a/p showed "considerable peripancreatic impaired duodenal inflammatory change likely representing some acute pancreatitis/duodenitis."  Since lipase is wnl, pt more likely has duodenitis. --Started on IV cipro/flagyl for duodenitis since pt may have reduced absorption for oral abx given duodenal  Inflammation. PLAN: --continue IV cipro/flagyl for now, may transition to oral abx tomorrow if symptoms and leukocytosis improve --advance to Full liquid today, if tolerating, then advance to solid tomorrow --recheck lactic acid to ensure normalization  # DM2 on insulin  --hold home metformin while inpatient --continue Lantus at reduced 15u nightly --SSI TID (no bed-time coverage unless pt eats a meal at bed-time) --Add meal-time 4u TID  # COPD, stable # current smoker --continue home daily bronchodilators as Breo and Incruse  # HTN --continue home coreg --Hold home HCTZ 25 mg daily for now --Per recent outpatient cards note on 02/08/20, clonidine d/c'ed.  # Diastolic CHF --home HCTZ as diuretic, hold for now  # Severe AS --Pending outpatient TAVR.  It was mentioned that perhaps pt could received his planned outpatient right/left heart cath while pt is inpatient, however, discussed with cardiology, and decision made to hold off heart cath until pt has recovered from his current illness.  # PAD s/p stent  --continue ASA, lipitor and plavix  # OSA on CPAP --continue CPAP nightly  # GERD --continue home protonix  # prostate CAs/p radiation therapy  # H/opancreatic mass/  IPMN s/p Whipple procedure 07/20/2018   DVT prophylaxis: Lovenox SQ Code Status: Full code  Family Communication:  Status is: inpatient Dispo:   The patient is from: home Anticipated d/c is to: home Anticipated d/c date is: 1-2 days Patient currently is not medically stable to d/c due to: on IV abx for duodenitis.     Subjective and Interval History:  Pt reported feeling better, however still having some abdominal pain.  Reported normal-appearing stool, and good urine output.  No more chest pain.  No fever, dyspnea, N/V/D.    Objective: Vitals:   02/19/20 0839 02/19/20 0916 02/19/20 1152 02/19/20 2007  BP: (!) 142/70 130/76 (!) 118/54 132/73  Pulse: 69  69 67  Resp: 14  14 20   Temp: 98.3 F (36.8 C) 97.8 F (36.6 C) 98 F (36.7 C) 98.8 F (37.1 C)  TempSrc: Oral  Oral Oral  SpO2: 94%  97% 96%  Weight:      Height:        Intake/Output Summary (Last 24 hours) at 02/20/2020 0129 Last data filed at 02/19/2020 2020 Gross per 24 hour  Intake 1457.43 ml  Output 2376 ml  Net -918.57 ml   Filed Weights   02/18/20 2317  Weight: 99.7 kg    Examination:   Constitutional: NAD, AAOx3 HEENT: conjunctivae and lids normal, EOMI CV: RRR 4+ systolic murmur at right upper sternal boarder. Distal pulses +2.  No cyanosis.   RESP: CTA B/L, normal respiratory effort  GI: active BS, large abdomen Extremities: No effusions, edema, or tenderness in BLE SKIN: warm, dry and intact Neuro: II - XII grossly intact.  Sensation intact Psych: Normal  mood and affect.  Appropriate judgement and reason   Data Reviewed: I have personally reviewed following labs and imaging studies  CBC: Recent Labs  Lab 02/18/20 1103 02/19/20 0517  WBC 16.2* 10.0  HGB 11.1* 10.6*  HCT 35.0* 33.8*  MCV 78.1* 78.2*  PLT 232 026   Basic Metabolic Panel: Recent Labs  Lab 02/18/20 1103 02/19/20 0517  NA 137 135  K 3.9 3.7  CL 101 101  CO2 26 26  GLUCOSE 225* 162*  BUN 23 17  CREATININE 0.75  0.71  CALCIUM 9.0 8.7*  MG  --  1.4*   GFR: Estimated Creatinine Clearance: 94.3 mL/min (by C-G formula based on SCr of 0.71 mg/dL). Liver Function Tests: Recent Labs  Lab 02/18/20 1340  AST 32  ALT 34  ALKPHOS 82  BILITOT 0.8  PROT 7.2  ALBUMIN 4.0   Recent Labs  Lab 02/18/20 1340  LIPASE 38   No results for input(s): AMMONIA in the last 168 hours. Coagulation Profile: No results for input(s): INR, PROTIME in the last 168 hours. Cardiac Enzymes: No results for input(s): CKTOTAL, CKMB, CKMBINDEX, TROPONINI in the last 168 hours. BNP (last 3 results) No results for input(s): PROBNP in the last 8760 hours. HbA1C: No results for input(s): HGBA1C in the last 72 hours. CBG: Recent Labs  Lab 02/18/20 2154 02/19/20 0736 02/19/20 1135 02/19/20 1632 02/19/20 2144  GLUCAP 227* 141* 223* 153* 273*   Lipid Profile: No results for input(s): CHOL, HDL, LDLCALC, TRIG, CHOLHDL, LDLDIRECT in the last 72 hours. Thyroid Function Tests: No results for input(s): TSH, T4TOTAL, FREET4, T3FREE, THYROIDAB in the last 72 hours. Anemia Panel: No results for input(s): VITAMINB12, FOLATE, FERRITIN, TIBC, IRON, RETICCTPCT in the last 72 hours. Sepsis Labs: Recent Labs  Lab 02/18/20 1447 02/18/20 2038 02/18/20 2200 02/19/20 0517  PROCALCITON  --  <0.10  --  <0.10  LATICACIDVEN 2.6* 2.3* 2.1*  --     Recent Results (from the past 240 hour(s))  SARS Coronavirus 2 by RT PCR (hospital order, performed in [facility] hospital lab) Nasopharyngeal Nasopharyngeal Swab     Status: None   Collection Time: 02/18/20  2:47 PM   Specimen: Nasopharyngeal Swab  Result Value Ref Range Status   SARS Coronavirus 2 NEGATIVE NEGATIVE Final    Comment: (NOTE) SARS-CoV-2 target nucleic acids are NOT DETECTED. The SARS-CoV-2 RNA is generally detectable in upper and lower respiratory specimens during the acute phase of infection. The lowest concentration of SARS-CoV-2 viral copies this assay can detect  is 250 copies / mL. A negative result does not preclude SARS-CoV-2 infection and should not be used as the sole basis for treatment or other patient management decisions.  A negative result may occur with improper specimen collection / handling, submission of specimen other than nasopharyngeal swab, presence of viral mutation(s) within the areas targeted by this assay, and inadequate number of viral copies (<250 copies / mL). A negative result must be combined with clinical observations, patient history, and epidemiological information. Fact Sheet for Patients:   StrictlyIdeas.no Fact Sheet for Healthcare Providers: BankingDealers.co.za This test is not yet approved or cleared  by the [place] FDA and has been authorized for detection and/or diagnosis of SARS-CoV-2 by FDA under an Emergency Use Authorization (EUA).  This EUA will remain in effect (meaning this test can be used) for the duration of the COVID-19 declaration under Section 564(b)(1) of the Act, 21 U.S.C. section 360bbb-3(b)(1), unless the authorization is terminated or revoked sooner. Performed  at [facility], [address] [place], [place] [address]       Radiology Studies: DG Chest 2 View  Result Date: 02/18/2020 CLINICAL DATA:  Chest pain with nausea and vomiting. EXAM: CHEST - 2 VIEW COMPARISON:  09/07/2018 FINDINGS: The cardiomediastinal silhouette is unchanged with normal heart size. The lungs are borderline hyperinflated with unchanged mild atelectasis or scarring in the left greater than right lung bases. No pulmonary edema, pleural effusion, pneumothorax is identified. No acute osseous abnormality is seen. IMPRESSION: Bibasilar atelectasis or scarring. No evidence of active cardiopulmonary disease. Electronically Signed   By: Logan Bores M.D.   On: 02/18/2020 11:47   CT ABDOMEN PELVIS W CONTRAST  Result Date: 02/18/2020 CLINICAL DATA:  Nausea and  vomiting with chest pain EXAM: CT ABDOMEN AND PELVIS WITH CONTRAST TECHNIQUE: Multidetector CT imaging of the abdomen and pelvis was performed using the standard protocol following bolus administration of intravenous contrast. CONTRAST:  154mL OMNIPAQUE IOHEXOL 300 MG/ML  SOLN COMPARISON:  None. FINDINGS: Lower chest: No acute abnormality. Hepatobiliary: Gallbladder has been surgically removed. Fatty infiltration of the liver is noted. Biliary stent is noted in place extending into the duodenum. Pancreas: Changes of prior pancreatic surgery are again seen. Increased edema within the residual pancreas is noted. Additionally there is increased peripancreatic fluid suspicious for focal pancreatitis. Alternatively these changes could be related to duodenal inflammation with secondary involvement of the pancreas. Correlation with laboratory values is recommended. Spleen: Normal in size without focal abnormality. Adrenals/Urinary Tract: Adrenal glands are within normal limits. Stable scattered cysts are noted within the kidneys bilaterally. No renal calculi are seen. No obstructive changes are noted. The bladder is well distended. Stomach/Bowel: No obstructive or inflammatory changes of the colon are seen. Postsurgical changes are noted in the stomach consistent with the prior partial pancreatectomy. Inflammatory changes are noted surrounding the anastomosis between the biliary tree and duodenum with biliary stent in place. This may represent some focal duodenal inflammation but as described above pancreatic inflammation deserves consideration as well. Vascular/Lymphatic: Aortic atherosclerosis. No enlarged abdominal or pelvic lymph nodes. Previously seen peripancreatic and periportal soft tissue densities have resolved in the interval. Reproductive: Prostate is unremarkable. Other: No abdominal wall hernia or abnormality. No abdominopelvic ascites. Musculoskeletal: No acute or significant osseous findings. IMPRESSION:  Changes of prior pancreatic surgery. The previously seen fluid collection adjacent to the pancreas has resolved. There is however now considerable peripancreatic impaired duodenal inflammatory change likely representing some acute pancreatitis/duodenitis. No other focal abnormality is noted. Electronically Signed   By: Inez Catalina M.D.   On: 02/18/2020 16:03     Scheduled Meds: . aspirin EC  81 mg Oral Daily  . atorvastatin  80 mg Oral Daily  . carvedilol  3.125 mg Oral BID  . clopidogrel  75 mg Oral Daily  . enoxaparin (LOVENOX) injection  40 mg Subcutaneous Q24H  . fluticasone furoate-vilanterol  1 puff Inhalation Daily  . gabapentin  200 mg Oral QHS  . insulin aspart  0-15 Units Subcutaneous TID WC  . insulin aspart  0-5 Units Subcutaneous QHS  . insulin aspart  4 Units Subcutaneous TID WC  . insulin glargine  15 Units Subcutaneous QHS  . pantoprazole  40 mg Oral BID  . tiotropium  18 mcg Inhalation Daily   Continuous Infusions: . ciprofloxacin 400 mg (02/19/20 2231)  . metronidazole 500 mg (02/20/20 0022)     LOS: 2 days     Enzo Bi, MD [facility] If 7PM-7AM, please  contact night-coverage 02/20/2020, 1:29 AM

## 2020-02-20 LAB — GLUCOSE, CAPILLARY
Glucose-Capillary: 147 mg/dL — ABNORMAL HIGH (ref 70–99)
Glucose-Capillary: 256 mg/dL — ABNORMAL HIGH (ref 70–99)
Glucose-Capillary: 129 mg/dL — ABNORMAL HIGH (ref 70–99)
Glucose-Capillary: 251 mg/dL — ABNORMAL HIGH (ref 70–99)

## 2020-02-20 LAB — BASIC METABOLIC PANEL
Anion gap: 7 (ref 5–15)
BUN: 11 mg/dL (ref 8–23)
CO2: 27 mmol/L (ref 22–32)
Calcium: 8.5 mg/dL — ABNORMAL LOW (ref 8.9–10.3)
Chloride: 104 mmol/L (ref 98–111)
Creatinine, Ser: 0.84 mg/dL (ref 0.61–1.24)
GFR calc Af Amer: >60 mL/min (ref >60)
GFR calc non Af Amer: >60 mL/min (ref >60)
Glucose, Bld: 134 mg/dL — ABNORMAL HIGH (ref 70–99)
Potassium: 3.5 mmol/L (ref 3.5–5.1)
Sodium: 138 mmol/L (ref 135–145)

## 2020-02-20 LAB — CBC
HCT: 32.3 % — ABNORMAL LOW (ref 39.0–52.0)
Hemoglobin: 10.1 g/dL — ABNORMAL LOW (ref 13.0–17.0)
MCH: 24.5 pg — ABNORMAL LOW (ref 26.0–34.0)
MCHC: 31.3 g/dL (ref 30.0–36.0)
MCV: 78.2 fL — ABNORMAL LOW (ref 80.0–100.0)
Platelets: 199 10*3/uL (ref 150–400)
RBC: 4.13 MIL/uL — ABNORMAL LOW (ref 4.22–5.81)
RDW: 17.1 % — ABNORMAL HIGH (ref 11.5–15.5)
WBC: 7 10*3/uL (ref 4.0–10.5)
nRBC: 0 % (ref 0.0–0.2)

## 2020-02-20 LAB — LACTIC ACID, PLASMA: Lactic Acid, Venous: 1 mmol/L (ref 0.5–1.9)

## 2020-02-20 LAB — MAGNESIUM: Magnesium: 2 mg/dL (ref 1.7–2.4)

## 2020-02-20 MED ORDER — TAMSULOSIN HCL 0.4 MG PO CAPS
0.4000 mg | ORAL_CAPSULE | Freq: Every day | ORAL | Status: DC
  Administered 2020-02-20 – 2020-02-21 (×2): 0.4 mg via ORAL
  Filled 2020-02-20 (×2): qty 1

## 2020-02-20 MED ORDER — SIMETHICONE 80 MG PO CHEW
80.0000 mg | CHEWABLE_TABLET | Freq: Four times a day (QID) | ORAL | Status: DC
  Administered 2020-02-20 – 2020-02-21 (×5): 80 mg via ORAL
  Filled 2020-02-20 (×6): qty 1

## 2020-02-20 NOTE — Progress Notes (Signed)
PROGRESS NOTE    Nathaniel Little  BTD:[id number] DOB: 03-22-1946 DOA: 02/18/2020 PCP: Verl Bangs, FNP   Brief Narrative:   HPI: Nathaniel Little is a 74 y.o. male with medical history as listed below who presented with sudden onset chest pain and abdominal pain.   Pt reported waking up with chest pain (epigastric area) and abdominal pain (lower abdomen) around 7 am this morning.  No N/V/D.  The only vomiting happened when the EMS was giving him ASA 365 on route to the hospital.  Pt said he always has minor dyspnea due to his severe aortic valve disease.  Pt denied constipation, and had a normal BM yesterday.  Pt reported night sweats, but no fever or chills.  Good urine output and no dysuria.  5/19: Patient seen and examined.  Abdominal pain improved.  Tolerating liquid diet without issue.   Assessment & Plan:   Active Problems:   Duodenitis  # Duodenitis # Lactic acidosis, resolved --presented with abdominal pain, elevated WBC, lactic acidosis. CT a/p showed "considerable peripancreatic impaired duodenal inflammatory change likely representing some acute pancreatitis/duodenitis." Since lipase is wnl, pt more likely has duodenitis. --Started on IV cipro/flagyl for duodenitis since pt may have reduced absorption for oral abx given duodenal  Inflammation. --Lactic acidosis resolved PLAN: Continue IV Cipro and IV Flagyl for today, plan to transition to oral tomorrow Advance diet as tolerated to heart healthy/carb modified.  Will point for dinner tonight Tentative plan for discharge home on 02/21/2020  # DM2 on insulin  --hold home metformin while inpatient --continue Lantusat reduced 15unightly --SSI TID (no bed-time coverage unless pt eats a meal at bed-time) --Add meal-time 4u TID  # COPD, stable # current smoker --continue home daily bronchodilators as Breoand Incruse  # HTN --continue home coreg --Hold home HCTZ25 mg daily for now --Per recent outpatient cards  note on 02/08/20, clonidine d/c'ed.  # Diastolic CHF --home HCTZas diuretic, hold for now  # Severe AS --Pending outpatient TAVR.  It was mentioned that perhaps pt could received his planned outpatient right/left heart cath while pt is inpatient, however, discussed with cardiology, and decision made to hold off heart cath until pt has recovered from his current illness.  # PAD s/p stent  --continue ASA, lipitor and plavix  # OSA on CPAP --continue CPAP nightly --Poor tolerance given poor fitment of mask  # GERD --continue home protonix  #prostate CAs/p radiation therapy  # H/opancreatic mass/ IPMN s/p Whipple procedure 07/20/2018   DVT prophylaxis: Lovenox Code Status: Full Family Communication: None today, offered to patient who declined Disposition Plan: Status is: Inpatient  Remains inpatient appropriate because:Inpatient level of care appropriate due to severity of illness   Dispo: The patient is from: Home              Anticipated d/c is to: Home              Anticipated d/c date is: 1 day              Patient currently is not medically stable to d/c.   Advance diet to regular today.  If abdominal pain continues to improve and patient tolerating regular diet plan on discharge home on 02/21/2020         Consultants:   None    Procedures:   none  Antimicrobials:   Ciprofloxacin (02/18/2020-  Metronidazole (02/18/2020-   Subjective: Seen and examined Abdominal pain improved No new complaints   Objective: Vitals:   02/20/20  4132 02/20/20 0853 02/20/20 0854 02/20/20 1220  BP: (!) 145/80  (!) 149/70 (!) 144/69  Pulse: 65 73 72 76  Resp: 18   12  Temp: 97.8 F (36.6 C) 97.9 F (36.6 C)  98 F (36.7 C)  TempSrc: Oral Oral  Oral  SpO2: 96%   97%  Weight:      Height:        Intake/Output Summary (Last 24 hours) at 02/20/2020 1243 Last data filed at 02/20/2020 1217 Gross per 24 hour  Intake 1931.61 ml  Output 2250 ml  Net -318.39  ml   Filed Weights   02/18/20 2317  Weight: 99.7 kg    Examination:  General exam: Appears calm and comfortable  Respiratory system: Clear to auscultation. Respiratory effort normal. Cardiovascular system: S1 & S2 heard, RRR. No JVD, murmurs, rubs, gallops or clicks. No pedal edema. Gastrointestinal system: Obese, mild TTP in epigastrium, nondistended.  Normal bowel sounds.. Central nervous system: Alert and oriented. No focal neurological deficits. Extremities: Symmetric 5 x 5 power. Skin: No rashes, lesions or ulcers Psychiatry: Judgement and insight appear normal. Mood & affect appropriate.     Data Reviewed: I have personally reviewed following labs and imaging studies  CBC: Recent Labs  Lab 02/18/20 1103 02/19/20 0517 02/20/20 0545  WBC 16.2* 10.0 7.0  HGB 11.1* 10.6* 10.1*  HCT 35.0* 33.8* 32.3*  MCV 78.1* 78.2* 78.2*  PLT 232 215 440   Basic Metabolic Panel: Recent Labs  Lab 02/18/20 1103 02/19/20 0517 02/20/20 0545  NA 137 135 138  K 3.9 3.7 3.5  CL 101 101 104  CO2 26 26 27   GLUCOSE 225* 162* 134*  BUN 23 17 11   CREATININE 0.75 0.71 0.84  CALCIUM 9.0 8.7* 8.5*  MG  --  1.4* 2.0   GFR: Estimated Creatinine Clearance: 89.8 mL/min (by C-G formula based on SCr of 0.84 mg/dL). Liver Function Tests: Recent Labs  Lab 02/18/20 1340  AST 32  ALT 34  ALKPHOS 82  BILITOT 0.8  PROT 7.2  ALBUMIN 4.0   Recent Labs  Lab 02/18/20 1340  LIPASE 38   No results for input(s): AMMONIA in the last 168 hours. Coagulation Profile: No results for input(s): INR, PROTIME in the last 168 hours. Cardiac Enzymes: No results for input(s): CKTOTAL, CKMB, CKMBINDEX, TROPONINI in the last 168 hours. BNP (last 3 results) No results for input(s): PROBNP in the last 8760 hours. HbA1C: No results for input(s): HGBA1C in the last 72 hours. CBG: Recent Labs  Lab 02/19/20 1135 02/19/20 1632 02/19/20 2144 02/20/20 0742 02/20/20 1135  GLUCAP 223* 153* 273* 147* 256*    Lipid Profile: No results for input(s): CHOL, HDL, LDLCALC, TRIG, CHOLHDL, LDLDIRECT in the last 72 hours. Thyroid Function Tests: No results for input(s): TSH, T4TOTAL, FREET4, T3FREE, THYROIDAB in the last 72 hours. Anemia Panel: No results for input(s): VITAMINB12, FOLATE, FERRITIN, TIBC, IRON, RETICCTPCT in the last 72 hours. Sepsis Labs: Recent Labs  Lab 02/18/20 1447 02/18/20 2038 02/18/20 2200 02/19/20 0517 02/20/20 0545  PROCALCITON  --  <0.10  --  <0.10  --   LATICACIDVEN 2.6* 2.3* 2.1*  --  1.0    Recent Results (from the past 240 hour(s))  SARS Coronavirus 2 by RT PCR (hospital order, performed in [facility]) Nasopharyngeal Nasopharyngeal Swab     Status: None   Collection Time: 02/18/20  2:47 PM   Specimen: Nasopharyngeal Swab  Result Value Ref Range Status   SARS Coronavirus 2  NEGATIVE NEGATIVE Final    Comment: (NOTE) SARS-CoV-2 target nucleic acids are NOT DETECTED. The SARS-CoV-2 RNA is generally detectable in upper and lower respiratory specimens during the acute phase of infection. The lowest concentration of SARS-CoV-2 viral copies this assay can detect is 250 copies / mL. A negative result does not preclude SARS-CoV-2 infection and should not be used as the sole basis for treatment or other patient management decisions.  A negative result may occur with improper specimen collection / handling, submission of specimen other than nasopharyngeal swab, presence of viral mutation(s) within the areas targeted by this assay, and inadequate number of viral copies (<250 copies / mL). A negative result must be combined with clinical observations, patient history, and epidemiological information. Fact Sheet for Patients:   StrictlyIdeas.no Fact Sheet for Healthcare Providers: BankingDealers.co.za This test is not yet approved or cleared  by the [place] FDA and has been authorized for detection  and/or diagnosis of SARS-CoV-2 by FDA under an Emergency Use Authorization (EUA).  This EUA will remain in effect (meaning this test can be used) for the duration of the COVID-19 declaration under Section 564(b)(1) of the Act, 21 U.S.C. section 360bbb-3(b)(1), unless the authorization is terminated or revoked sooner. Performed at [facility], [address] [place], [place] [address]          Radiology Studies: CT ABDOMEN PELVIS W CONTRAST  Result Date: 02/18/2020 CLINICAL DATA:  Nausea and vomiting with chest pain EXAM: CT ABDOMEN AND PELVIS WITH CONTRAST TECHNIQUE: Multidetector CT imaging of the abdomen and pelvis was performed using the standard protocol following bolus administration of intravenous contrast. CONTRAST:  128mL OMNIPAQUE IOHEXOL 300 MG/ML  SOLN COMPARISON:  None. FINDINGS: Lower chest: No acute abnormality. Hepatobiliary: Gallbladder has been surgically removed. Fatty infiltration of the liver is noted. Biliary stent is noted in place extending into the duodenum. Pancreas: Changes of prior pancreatic surgery are again seen. Increased edema within the residual pancreas is noted. Additionally there is increased peripancreatic fluid suspicious for focal pancreatitis. Alternatively these changes could be related to duodenal inflammation with secondary involvement of the pancreas. Correlation with laboratory values is recommended. Spleen: Normal in size without focal abnormality. Adrenals/Urinary Tract: Adrenal glands are within normal limits. Stable scattered cysts are noted within the kidneys bilaterally. No renal calculi are seen. No obstructive changes are noted. The bladder is well distended. Stomach/Bowel: No obstructive or inflammatory changes of the colon are seen. Postsurgical changes are noted in the stomach consistent with the prior partial pancreatectomy. Inflammatory changes are noted surrounding the anastomosis between the biliary tree and duodenum with  biliary stent in place. This may represent some focal duodenal inflammation but as described above pancreatic inflammation deserves consideration as well. Vascular/Lymphatic: Aortic atherosclerosis. No enlarged abdominal or pelvic lymph nodes. Previously seen peripancreatic and periportal soft tissue densities have resolved in the interval. Reproductive: Prostate is unremarkable. Other: No abdominal wall hernia or abnormality. No abdominopelvic ascites. Musculoskeletal: No acute or significant osseous findings. IMPRESSION: Changes of prior pancreatic surgery. The previously seen fluid collection adjacent to the pancreas has resolved. There is however now considerable peripancreatic impaired duodenal inflammatory change likely representing some acute pancreatitis/duodenitis. No other focal abnormality is noted. Electronically Signed   By: Inez Catalina M.D.   On: 02/18/2020 16:03        Scheduled Meds: . aspirin EC  81 mg Oral Daily  . atorvastatin  80 mg Oral Daily  . carvedilol  3.125 mg Oral BID  . clopidogrel  75 mg Oral Daily  . enoxaparin (LOVENOX) injection  40 mg Subcutaneous Q24H  . fluticasone furoate-vilanterol  1 puff Inhalation Daily  . gabapentin  200 mg Oral QHS  . insulin aspart  0-15 Units Subcutaneous TID WC  . insulin aspart  0-5 Units Subcutaneous QHS  . insulin aspart  4 Units Subcutaneous TID WC  . insulin glargine  15 Units Subcutaneous QHS  . pantoprazole  40 mg Oral BID  . simethicone  80 mg Oral QID  . tiotropium  18 mcg Inhalation Daily   Continuous Infusions: . ciprofloxacin 400 mg (02/20/20 0913)  . metronidazole 500 mg (02/20/20 0605)     LOS: 2 days    Time spent: 35 minutes    Sidney Ace, MD [facility] Pager [phone]  If 7PM-7AM, please contact night-coverage 02/20/2020, 12:43 PM

## 2020-02-21 ENCOUNTER — Encounter
Admission: EM | Disposition: A | Discharge status: Home / Self Care | Payer: Self-pay | Source: Home / Self Care | Attending: Hospitalist

## 2020-02-21 ENCOUNTER — Encounter: Payer: Self-pay | Admitting: Hospitalist

## 2020-02-21 ENCOUNTER — Telehealth: Payer: Self-pay

## 2020-02-21 DIAGNOSIS — R0602 Shortness of breath: Secondary | ICD-10-CM

## 2020-02-21 DIAGNOSIS — I200 Unstable angina: Secondary | ICD-10-CM

## 2020-02-21 DIAGNOSIS — I350 Nonrheumatic aortic (valve) stenosis: Secondary | ICD-10-CM

## 2020-02-21 HISTORY — PX: RIGHT/LEFT HEART CATH AND CORONARY ANGIOGRAPHY: CATH118266

## 2020-02-21 LAB — CBC
HCT: 30.4 % — ABNORMAL LOW (ref 39.0–52.0)
Hemoglobin: 9.9 g/dL — ABNORMAL LOW (ref 13.0–17.0)
MCH: 24.9 pg — ABNORMAL LOW (ref 26.0–34.0)
MCHC: 32.6 g/dL (ref 30.0–36.0)
MCV: 76.6 fL — ABNORMAL LOW (ref 80.0–100.0)
Platelets: 197 10*3/uL (ref 150–400)
RBC: 3.97 MIL/uL — ABNORMAL LOW (ref 4.22–5.81)
RDW: 17.2 % — ABNORMAL HIGH (ref 11.5–15.5)
WBC: 7.5 10*3/uL (ref 4.0–10.5)
nRBC: 0 % (ref 0.0–0.2)

## 2020-02-21 LAB — URINALYSIS, ROUTINE W REFLEX MICROSCOPIC
Bacteria, UA: NONE SEEN
Bilirubin Urine: NEGATIVE
Glucose, UA: >=500 mg/dL — AB
Hgb urine dipstick: NEGATIVE
Ketones, ur: NEGATIVE mg/dL
Leukocytes,Ua: NEGATIVE
Nitrite: NEGATIVE
Protein, ur: NEGATIVE mg/dL
Specific Gravity, Urine: 1.025 (ref 1.005–1.030)
pH: 6 (ref 5.0–8.0)

## 2020-02-21 LAB — BASIC METABOLIC PANEL
Anion gap: 8 (ref 5–15)
BUN: 10 mg/dL (ref 8–23)
CO2: 26 mmol/L (ref 22–32)
Calcium: 8.9 mg/dL (ref 8.9–10.3)
Chloride: 104 mmol/L (ref 98–111)
Creatinine, Ser: 0.92 mg/dL (ref 0.61–1.24)
GFR calc Af Amer: >60 mL/min (ref >60)
GFR calc non Af Amer: >60 mL/min (ref >60)
Glucose, Bld: 220 mg/dL — ABNORMAL HIGH (ref 70–99)
Potassium: 3.7 mmol/L (ref 3.5–5.1)
Sodium: 138 mmol/L (ref 135–145)

## 2020-02-21 LAB — GLUCOSE, CAPILLARY
Glucose-Capillary: 146 mg/dL — ABNORMAL HIGH (ref 70–99)
Glucose-Capillary: 159 mg/dL — ABNORMAL HIGH (ref 70–99)
Glucose-Capillary: 135 mg/dL — ABNORMAL HIGH (ref 70–99)
Glucose-Capillary: 149 mg/dL — ABNORMAL HIGH (ref 70–99)
Glucose-Capillary: 233 mg/dL — ABNORMAL HIGH (ref 70–99)

## 2020-02-21 LAB — MAGNESIUM: Magnesium: 1.8 mg/dL (ref 1.7–2.4)

## 2020-02-21 SURGERY — RIGHT/LEFT HEART CATH AND CORONARY ANGIOGRAPHY
Anesthesia: Moderate Sedation

## 2020-02-21 MED ORDER — IOHEXOL 300 MG/ML  SOLN
INTRAMUSCULAR | Status: DC | PRN
  Administered 2020-02-21: 100 mL

## 2020-02-21 MED ORDER — CIPROFLOXACIN HCL 500 MG PO TABS: 500.0000 mg | ORAL_TABLET | Freq: Two times a day (BID) | ORAL | 0 refills | Status: AC

## 2020-02-21 MED ORDER — ASPIRIN 81 MG PO CHEW
81.0000 mg | CHEWABLE_TABLET | ORAL | Status: AC
  Administered 2020-02-21: 81 mg via ORAL

## 2020-02-21 MED ORDER — CIPROFLOXACIN HCL 500 MG PO TABS
500.0000 mg | ORAL_TABLET | Freq: Two times a day (BID) | ORAL | Status: DC
  Administered 2020-02-21: 500 mg via ORAL
  Filled 2020-02-21: qty 1

## 2020-02-21 MED ORDER — MIDAZOLAM HCL 2 MG/2ML IJ SOLN
INTRAMUSCULAR | Status: DC | PRN
  Administered 2020-02-21: 1 mg via INTRAVENOUS

## 2020-02-21 MED ORDER — MIDAZOLAM HCL 2 MG/2ML IJ SOLN
INTRAMUSCULAR | Status: AC
  Filled 2020-02-21: qty 2

## 2020-02-21 MED ORDER — FENTANYL CITRATE (PF) 100 MCG/2ML IJ SOLN
INTRAMUSCULAR | Status: DC | PRN
  Administered 2020-02-21: 50 ug via INTRAVENOUS

## 2020-02-21 MED ORDER — SODIUM CHLORIDE 0.9 % WEIGHT BASED INFUSION
1.0000 mL/kg/h | INTRAVENOUS | Status: DC
  Administered 2020-02-21: 1 mL/kg/h via INTRAVENOUS

## 2020-02-21 MED ORDER — MAGNESIUM SULFATE 2 GM/50ML IV SOLN
2.0000 g | Freq: Once | INTRAVENOUS | Status: AC
  Administered 2020-02-21: 2 g via INTRAVENOUS
  Filled 2020-02-21: qty 50

## 2020-02-21 MED ORDER — ASPIRIN 81 MG PO CHEW
CHEWABLE_TABLET | ORAL | Status: AC
  Filled 2020-02-21: qty 1

## 2020-02-21 MED ORDER — METRONIDAZOLE 500 MG PO TABS: 500.0000 mg | ORAL_TABLET | Freq: Three times a day (TID) | ORAL | 0 refills | Status: AC

## 2020-02-21 MED ORDER — POTASSIUM CHLORIDE CRYS ER 20 MEQ PO TBCR
40.0000 meq | EXTENDED_RELEASE_TABLET | Freq: Once | ORAL | Status: AC
  Administered 2020-02-21: 40 meq via ORAL
  Filled 2020-02-21: qty 2

## 2020-02-21 MED ORDER — HEPARIN (PORCINE) IN NACL 1000-0.9 UT/500ML-% IV SOLN
INTRAVENOUS | Status: AC
  Filled 2020-02-21: qty 1000

## 2020-02-21 MED ORDER — METRONIDAZOLE 500 MG PO TABS
500.0000 mg | ORAL_TABLET | Freq: Three times a day (TID) | ORAL | Status: DC
  Administered 2020-02-21: 500 mg via ORAL
  Filled 2020-02-21 (×3): qty 1

## 2020-02-21 MED ORDER — SODIUM CHLORIDE 0.9 % WEIGHT BASED INFUSION
3.0000 mL/kg/h | INTRAVENOUS | Status: AC
  Administered 2020-02-21: 3 mL/kg/h via INTRAVENOUS

## 2020-02-21 MED ORDER — FENTANYL CITRATE (PF) 100 MCG/2ML IJ SOLN
INTRAMUSCULAR | Status: AC
  Filled 2020-02-21: qty 2

## 2020-02-21 SURGICAL SUPPLY — 13 items
CATH INFINITI 5FR JL4 (CATHETERS) ×2 IMPLANT
CATH INFINITI JR4 5F (CATHETERS) ×2 IMPLANT
CATH SWANZ 7F THERMO (CATHETERS) ×2 IMPLANT
DEVICE CLOSURE MYNXGRIP 6/7F (Vascular Products) ×2 IMPLANT
GUIDEWIRE EMER 3M J .025X150CM (WIRE) ×2 IMPLANT
KIT MANI 3VAL PERCEP (MISCELLANEOUS) ×2 IMPLANT
NEEDLE PERC 18GX7CM (NEEDLE) ×2 IMPLANT
PACK CARDIAC CATH (CUSTOM PROCEDURE TRAY) ×2 IMPLANT
SHEATH AVANTI 6FR X 11CM (SHEATH) ×2 IMPLANT
SHEATH AVANTI 7FRX11 (SHEATH) ×2 IMPLANT
WIRE EMERALD ST .035X150CM (WIRE) ×2 IMPLANT
WIRE GUIDERIGHT .035X150 (WIRE) ×2 IMPLANT
WIRE HITORQ VERSACORE ST 145CM (WIRE) ×2 IMPLANT

## 2020-02-21 NOTE — Discharge Summary (Signed)
Physician Discharge Summary  Nathaniel Little ZOX:[id number] DOB: 11-03-1945 DOA: 02/18/2020  PCP: Nathaniel Bangs, FNP  Admit date: 02/18/2020 Discharge date: 02/21/2020  Admitted From: Home Disposition: Home  Recommendations for Outpatient Follow-up:  1. Follow up with PCP in 1-2 weeks 2. Follow-up with Dr. Gennaro Africa at [facility] on 5/24 (cardiologist in [place])  [place]: No Equipment/Devices: None Discharge Condition: Stable CODE STATUS: Full Diet recommendation: Heart Healthy / Carb Modified  Brief/Interim Summary: [name] Nathaniel Little a 74 y.o.malewith medical historyas listed below who presented with sudden onset chest pain and abdominal pain.   Pt reported waking up with chest pain (epigastric area) and abdominal pain (lower abdomen) around 7 am this morning. No N/V/D. The only vomiting happened when the EMS was giving him ASA 365 on route to the hospital. Pt said he always has minor dyspnea due to his severe aortic valve disease. Pt denied constipation, and had a normal BM yesterday. Pt reported night sweats, but no fever or chills. Good urine output and no dysuria.  5/19: Patient seen and examined.  Abdominal pain improved.  Tolerating liquid diet without issue.  5/20: Patient seen and examined.  Underwent diagnostic catheterization with interventional cardiologist today.  Confirmed severe aortic stenosis.  Patient has a referral in place to TAVR specialist in [place] on Monday, 02/25/2020.  Duodenitis symptoms of essentially resolved.  Will transition to oral antibiotics to complete total 10-day empiric course.  Patient tolerating p.o. diet without issue at time of discharge.  Discharged stable condition.  Discharge Diagnoses:  Active Problems:   Duodenitis  # Duodenitis # Lactic acidosis, resolved --presented with abdominal pain, elevated WBC, lactic acidosis. CT a/p showed"considerable peripancreatic impaired duodenal inflammatory change likely representing  some acute pancreatitis/duodenitis."Since lipase is wnl, pt more likely has duodenitis. --Started on IV cipro/flagyl for duodenitis since pt may have reduced absorption for oral abx given duodenal Inflammation. --Lactic acidosis resolved  Abdominal pain resolved at time of discharge.  Patient tolerating p.o. diet without issue.  Transition to p.o. ciprofloxacin and p.o. Flagyl.  Complete empiric 10-day course.  Follow-up outpatient PCP.  # DM2 on insulin  Resume home regimen on discharge  # COPD, stable # current smoker --continue home daily bronchodilators as Breoand Incruse  # HTN Resume home regimen on discharge  # Diastolic CHF Resume home regimen on discharge  # Severe AS Underwent diagnostic catheterization in house for preprocedure screening.  Confirm severe aortic stenosis.  Referred to TAVR specialist in [place].  # PAD s/p stent  --continue ASA, lipitor and plavix  # OSA on CPAP --continue CPAP nightly --Poor tolerance given poor fitment of mask  # GERD --continue home protonix  #prostate CAs/p radiation therapy  # H/opancreatic mass/ IPMN s/p Whipple procedure 07/20/2018  Discharge Instructions  Discharge Instructions    Diet - low sodium heart healthy   Complete by: As directed    Increase activity slowly   Complete by: As directed      Allergies as of 02/21/2020      Reactions   Codeine Itching      Medication List    TAKE these medications   albuterol (2.5 MG/3ML) 0.083% nebulizer solution Commonly known as: PROVENTIL Take 2.5 mg by nebulization every 6 (six) hours as needed for wheezing or shortness of breath.   albuterol 108 (90 Base) MCG/ACT inhaler Commonly known as: VENTOLIN HFA Inhale 2 puffs into the lungs every 6 (six) hours as needed for wheezing or shortness of breath.   aspirin EC 81 MG  tablet Take 81 mg by mouth daily.   atorvastatin 80 MG tablet Commonly known as: LIPITOR Take 1 tablet (80 mg total) by  mouth daily.   carvedilol 3.125 MG tablet Commonly known as: COREG Take 1 tablet (3.125 mg total) by mouth 2 (two) times daily.   ciprofloxacin 500 MG tablet Commonly known as: CIPRO Take 1 tablet (500 mg total) by mouth 2 (two) times daily for 7 days.   clopidogrel 75 MG tablet Commonly known as: PLAVIX TAKE 1 TABLET BY MOUTH ONCE DAILY   diclofenac 75 MG EC tablet Commonly known as: VOLTAREN Take 75 mg by mouth 2 (two) times daily as needed for mild pain or moderate pain.   docusate sodium 100 MG capsule Commonly known as: COLACE Take 100 mg by mouth daily as needed for mild constipation.   gabapentin 100 MG capsule Commonly known as: NEURONTIN Take 100-200 mg by mouth at bedtime.   hydrochlorothiazide 25 MG tablet Commonly known as: HYDRODIURIL Take 1 tablet (25 mg total) by mouth daily.   insulin glargine 100 unit/mL Sopn Commonly known as: LANTUS Inject 20 Units into the skin at bedtime.   metFORMIN 500 MG tablet Commonly known as: GLUCOPHAGE Take 1,000 mg by mouth 2 (two) times daily.   metroNIDAZOLE 500 MG tablet Commonly known as: FLAGYL Take 1 tablet (500 mg total) by mouth every 8 (eight) hours for 7 days.   pantoprazole 40 MG tablet Commonly known as: PROTONIX Take 1 tablet (40 mg total) by mouth 2 (two) times daily.   Symbicort 80-4.5 MCG/ACT inhaler Generic drug: budesonide-formoterol INHALE 2 PUFFS BY MOUTH TWICE DAILY   tiotropium 18 MCG inhalation capsule Commonly known as: Spiriva HandiHaler Place 1 capsule (18 mcg total) into inhaler and inhale daily.   traZODone 100 MG tablet Commonly known as: DESYREL Take 100 mg by mouth at bedtime as needed for sleep.      Follow-up Information    Nathaniel Blanks, MD. Go on 02/25/2020.   Specialty: Cardiology Why: You have an appt scheduled with Dr. Angelena Form 02/25/20 at [facility].  Please call his office to confirm Contact information: [address] [place]  [address] [phone]        Nathaniel Bangs, FNP. Schedule an appointment as soon as possible for a visit in 1 week(s).   Specialty: Family Medicine Contact information: [place] [address] [phone]        Nathaniel Merritts, MD .   Specialty: Cardiology Contact information: [place] [address] [phone]          Allergies  Allergen Reactions  . Codeine Itching    Consultations:  Cardiology [facility]   Procedures/Studies: DG Chest 2 View  Result Date: 02/18/2020 CLINICAL DATA:  Chest pain with nausea and vomiting. EXAM: CHEST - 2 VIEW COMPARISON:  09/07/2018 FINDINGS: The cardiomediastinal silhouette is unchanged with normal heart size. The lungs are borderline hyperinflated with unchanged mild atelectasis or scarring in the left greater than right lung bases. No pulmonary edema, pleural effusion, pneumothorax is identified. No acute osseous abnormality is seen. IMPRESSION: Bibasilar atelectasis or scarring. No evidence of active cardiopulmonary disease. Electronically Signed   By: Logan Bores M.D.   On: 02/18/2020 11:47   CT ABDOMEN PELVIS W CONTRAST  Result Date: 02/18/2020 CLINICAL DATA:  Nausea and vomiting with chest pain EXAM: CT ABDOMEN AND PELVIS WITH CONTRAST TECHNIQUE: Multidetector CT imaging of the abdomen and pelvis was performed  using the standard protocol following bolus administration of intravenous contrast. CONTRAST:  174mL OMNIPAQUE IOHEXOL 300 MG/ML  SOLN COMPARISON:  None. FINDINGS: Lower chest: No acute abnormality. Hepatobiliary: Gallbladder has been surgically removed. Fatty infiltration of the liver is noted. Biliary stent is noted in place extending into the duodenum. Pancreas: Changes of prior pancreatic surgery are again seen. Increased edema within the residual pancreas is noted. Additionally there is increased peripancreatic fluid suspicious for focal pancreatitis. Alternatively these changes could be  related to duodenal inflammation with secondary involvement of the pancreas. Correlation with laboratory values is recommended. Spleen: Normal in size without focal abnormality. Adrenals/Urinary Tract: Adrenal glands are within normal limits. Stable scattered cysts are noted within the kidneys bilaterally. No renal calculi are seen. No obstructive changes are noted. The bladder is well distended. Stomach/Bowel: No obstructive or inflammatory changes of the colon are seen. Postsurgical changes are noted in the stomach consistent with the prior partial pancreatectomy. Inflammatory changes are noted surrounding the anastomosis between the biliary tree and duodenum with biliary stent in place. This may represent some focal duodenal inflammation but as described above pancreatic inflammation deserves consideration as well. Vascular/Lymphatic: Aortic atherosclerosis. No enlarged abdominal or pelvic lymph nodes. Previously seen peripancreatic and periportal soft tissue densities have resolved in the interval. Reproductive: Prostate is unremarkable. Other: No abdominal wall hernia or abnormality. No abdominopelvic ascites. Musculoskeletal: No acute or significant osseous findings. IMPRESSION: Changes of prior pancreatic surgery. The previously seen fluid collection adjacent to the pancreas has resolved. There is however now considerable peripancreatic impaired duodenal inflammatory change likely representing some acute pancreatitis/duodenitis. No other focal abnormality is noted. Electronically Signed   By: Inez Catalina M.D.   On: 02/18/2020 16:03   CARDIAC CATHETERIZATION  Result Date: 02/21/2020  The left ventricular ejection fraction is 55-65% by visual estimate.  LV end diastolic pressure is normal.  The left ventricular systolic function is normal.  There is severe aortic valve stenosis.    ECHOCARDIOGRAM COMPLETE  Result Date: 02/07/2020    ECHOCARDIOGRAM REPORT   Patient Name:   Nathaniel Little Date of Exam:  02/07/2020 Medical Rec #:  [id number]    Height:       69.0 in Accession #:    [id number]   Weight:       221.0 lb Date of Birth:  November 23, 1945   BSA:          2.156 m Patient Age:    74 years     BP:           122/64 mmHg Patient Gender: M            HR:           78 bpm. Exam Location:  [place] Procedure: 2D Echo, Cardiac Doppler and Color Doppler Indications:    I35.8 Other nonrheumatic aortic valve disorders,; R01.1 Murmur  History:        Patient has prior history of Echocardiogram examinations, most                 recent 07/17/2018. COPD, Aortic Valve Disease,                 Signs/Symptoms:Murmur; Risk Factors:Sleep Apnea, Hypertension,                 Diabetes, Dyslipidemia and Current Smoker. Patient states he has                 no SOB but has lung problems due to COPD.  He uses a motorized                 wheelchair to ambulate.  Sonographer:    Salvadore Dom RVT, RDCS (AE), RDMS Referring Phys: [id number] CARMEN L GONZALEZ IMPRESSIONS  1. Left ventricular ejection fraction, by estimation, is 60 to 65%. The left ventricle has normal function. The left ventricle has no regional wall motion abnormalities. There is mild left ventricular hypertrophy. Left ventricular diastolic parameters are consistent with Grade I diastolic dysfunction (impaired relaxation).  2. Right ventricular systolic function is normal. The right ventricular size is normal.  3. Left atrial size was mildly dilated.  4. The mitral valve is normal in structure. No evidence of mitral valve regurgitation.  5. Aortic valve DVI = 0.23, Peak gradient 22mmHg, Mean Gradient 37, peak velocity 4.50m/s. Unable to determine valve morphology due to image quality. Aortic valve regurgitation is not visualized. Severe aortic valve stenosis. Aortic valve area, by VTI measures 0.85 cm.  6. Aortic root is upper limit of normal in size. Comparison(s): FINDINGS  Left Ventricle: Left ventricular ejection fraction, by estimation, is 60 to 65%. The left ventricle has  normal function. The left ventricle has no regional wall motion abnormalities. The left ventricular internal cavity size was normal in size. There is  mild left ventricular hypertrophy. Left ventricular diastolic parameters are consistent with Grade I diastolic dysfunction (impaired relaxation). Right Ventricle: The right ventricular size is normal. No increase in right ventricular wall thickness. Right ventricular systolic function is normal. Left Atrium: Left atrial size was mildly dilated. Right Atrium: Right atrial size was normal in size. Pericardium: There is no evidence of pericardial effusion. Mitral Valve: The mitral valve is normal in structure. No evidence of mitral valve regurgitation. Tricuspid Valve: The tricuspid valve is grossly normal. Tricuspid valve regurgitation is not demonstrated. Aortic Valve: Aortic valve DVI = 0.23, Peak gradient 40mmHg, Mean Gradient 37, peak velocity 4.68m/s. Unable to determine valve morphology due to image quality. Aortic valve regurgitation is not visualized. Severe aortic stenosis is present. Aortic valve  mean gradient measures 34.5 mmHg. Aortic valve peak gradient measures 66.6 mmHg. Aortic valve area, by VTI measures 0.85 cm. Pulmonic Valve: The pulmonic valve was not well visualized. Pulmonic valve regurgitation is not visualized. Aorta: Root is upper limit of normal in size. Venous: The inferior vena cava was not well visualized. IAS/Shunts: No atrial level shunt detected by color flow Doppler.  LEFT VENTRICLE PLAX 2D LVIDd:         4.50 cm      Diastology LVIDs:         2.40 cm      LV e' lateral:   7.94 cm/s LV PW:         1.70 cm      LV E/e' lateral: 9.2 LV IVS:        1.40 cm      LV e' medial:    5.55 cm/s LVOT diam:     2.10 cm      LV E/e' medial:  13.1 LV SV:         75 LV SV Index:   35 LVOT Area:     3.46 cm  LV Volumes (MOD) LV vol d, MOD A2C: 75.1 ml LV vol d, MOD A4C: 121.0 ml LV vol s, MOD A2C: 16.2 ml LV vol s, MOD A4C: 29.4 ml LV SV MOD A2C:      58.9 ml LV SV MOD A4C:  121.0 ml LV SV MOD BP:      79.0 ml RIGHT VENTRICLE RV S prime:     16.80 cm/s TAPSE (M-mode): 2.9 cm LEFT ATRIUM             Index       RIGHT ATRIUM           Index LA diam:        4.20 cm 1.95 cm/m  RA Area:     19.20 cm LA Vol (A2C):   77.7 ml 36.05 ml/m RA Volume:   56.00 ml  25.98 ml/m LA Vol (A4C):   90.9 ml 42.17 ml/m LA Biplane Vol: 88.3 ml 40.96 ml/m  AORTIC VALVE                    PULMONIC VALVE AV Area (Vmax):    0.82 cm     PV Vmax:       1.00 m/s AV Area (Vmean):   0.85 cm     PV Peak grad:  4.0 mmHg AV Area (VTI):     0.85 cm AV Vmax:           408.00 cm/s AV Vmean:          274.500 cm/s AV VTI:            0.886 m AV Peak Grad:      66.6 mmHg AV Mean Grad:      34.5 mmHg LVOT Vmax:         96.70 cm/s LVOT Vmean:        67.700 cm/s LVOT VTI:          0.217 m LVOT/AV VTI ratio: 0.25  AORTA Ao Root diam: 3.60 cm Ao Asc diam:  3.70 cm MITRAL VALVE               TRICUSPID VALVE MV Area (PHT): 2.54 cm    TR Peak grad:   6.0 mmHg MV Decel Time: 299 msec    TR Vmax:        122.00 cm/s MV E velocity: 72.70 cm/s MV A velocity: 99.50 cm/s  SHUNTS MV E/A ratio:  0.73        Systemic VTI:  0.22 m                            Systemic Diam: 2.10 cm Kate Sable MD Electronically signed by Kate Sable MD Signature Date/Time: 02/07/2020/6:21:18 PM    Final     (Echo, Carotid, EGD, Colonoscopy, ERCP)    Subjective: Seen and examined.  Status post cardiac catheterization.  No pain at catheterization site.  No new complaints.  Patient tolerating p.o. diet.  Stable for discharge.  Discharge Exam: Vitals:   02/21/20 1145 02/21/20 1340  BP: (!) 165/75 (!) 159/75  Pulse: (!) 57 66  Resp: 18   Temp:  97.7 F (36.5 C)  SpO2: 95% 95%   Vitals:   02/21/20 1123 02/21/20 1130 02/21/20 1145 02/21/20 1340  BP: (!) 179/80 (!) 157/78 (!) 165/75 (!) 159/75  Pulse: 64 63 (!) 57 66  Resp: 16 16 18    Temp:    97.7 F (36.5 C)  TempSrc:    Oral  SpO2: 93% 93% 95% 95%   Weight:      Height:        General: Pt is alert, awake, not in acute distress Cardiovascular: RRR, S1/S2 +, no rubs, no gallops  Respiratory: CTA bilaterally, no wheezing, no rhonchi Abdominal: Soft, NT, ND, bowel sounds + Extremities: no edema, no cyanosis    The results of significant diagnostics from this hospitalization (including imaging, microbiology, ancillary and laboratory) are listed below for reference.     Microbiology: Recent Results (from the past 240 hour(s))  SARS Coronavirus 2 by RT PCR (hospital order, performed in [facility] hospital lab) Nasopharyngeal Nasopharyngeal Swab     Status: None   Collection Time: 02/18/20  2:47 PM   Specimen: Nasopharyngeal Swab  Result Value Ref Range Status   SARS Coronavirus 2 NEGATIVE NEGATIVE Final    Comment: (NOTE) SARS-CoV-2 target nucleic acids are NOT DETECTED. The SARS-CoV-2 RNA is generally detectable in upper and lower respiratory specimens during the acute phase of infection. The lowest concentration of SARS-CoV-2 viral copies this assay can detect is 250 copies / mL. A negative result does not preclude SARS-CoV-2 infection and should not be used as the sole basis for treatment or other patient management decisions.  A negative result may occur with improper specimen collection / handling, submission of specimen other than nasopharyngeal swab, presence of viral mutation(s) within the areas targeted by this assay, and inadequate number of viral copies (<250 copies / mL). A negative result must be combined with clinical observations, patient history, and epidemiological information. Fact Sheet for Patients:   StrictlyIdeas.no Fact Sheet for Healthcare Providers: BankingDealers.co.za This test is not yet approved or cleared  by the [place] FDA and has been authorized for detection and/or diagnosis of SARS-CoV-2 by FDA under an Emergency Use Authorization (EUA).   This EUA will remain in effect (meaning this test can be used) for the duration of the COVID-19 declaration under Section 564(b)(1) of the Act, 21 U.S.C. section 360bbb-3(b)(1), unless the authorization is terminated or revoked sooner. Performed at [facility], [address] [place],  [address]      Labs: BNP (last 3 results) No results for input(s): BNP in the last 8760 hours. Basic Metabolic Panel: Recent Labs  Lab 02/18/20 1103 02/19/20 0517 02/20/20 0545 02/21/20 0427  NA 137 135 138 138  K 3.9 3.7 3.5 3.7  CL 101 101 104 104  CO2 26 26 27 26   GLUCOSE 225* 162* 134* 220*  BUN 23 17 11 10   CREATININE 0.75 0.71 0.84 0.92  CALCIUM 9.0 8.7* 8.5* 8.9  MG  --  1.4* 2.0 1.8   Liver Function Tests: Recent Labs  Lab 02/18/20 1340  AST 32  ALT 34  ALKPHOS 82  BILITOT 0.8  PROT 7.2  ALBUMIN 4.0   Recent Labs  Lab 02/18/20 1340  LIPASE 38   No results for input(s): AMMONIA in the last 168 hours. CBC: Recent Labs  Lab 02/18/20 1103 02/19/20 0517 02/20/20 0545 02/21/20 0427  WBC 16.2* 10.0 7.0 7.5  HGB 11.1* 10.6* 10.1* 9.9*  HCT 35.0* 33.8* 32.3* 30.4*  MCV 78.1* 78.2* 78.2* 76.6*  PLT 232 215 199 197   Cardiac Enzymes: No results for input(s): CKTOTAL, CKMB, CKMBINDEX, TROPONINI in the last 168 hours. BNP: Invalid input(s): POCBNP CBG: Recent Labs  Lab 02/20/20 2135 02/21/20 0758 02/21/20 0855 02/21/20 1152 02/21/20 1205  GLUCAP 251* 146* 159* 149* 135*   D-Dimer No results for input(s): DDIMER in the last 72 hours. Hgb A1c No results for input(s): HGBA1C in the last 72 hours. Lipid Profile No results for input(s): CHOL, HDL, LDLCALC, TRIG, CHOLHDL, LDLDIRECT in the last 72 hours. Thyroid function studies No results  for input(s): TSH, T4TOTAL, T3FREE, THYROIDAB in the last 72 hours.  Invalid input(s): FREET3 Anemia work up No results for input(s): VITAMINB12, FOLATE, FERRITIN, TIBC, IRON, RETICCTPCT in the last 72  hours. Urinalysis    Component Value Date/Time   COLORURINE YELLOW (A) 02/18/2020 1447   APPEARANCEUR CLEAR (A) 02/18/2020 1447   LABSPEC 1.025 02/18/2020 1447   PHURINE 6.0 02/18/2020 1447   GLUCOSEU 150 (A) 02/18/2020 1447   HGBUR NEGATIVE 02/18/2020 1447   [place] 02/18/2020 1447   KETONESUR NEGATIVE 02/18/2020 1447   PROTEINUR 100 (A) 02/18/2020 1447   NITRITE NEGATIVE 02/18/2020 1447   LEUKOCYTESUR NEGATIVE 02/18/2020 1447   Sepsis Labs Invalid input(s): PROCALCITONIN,  WBC,  LACTICIDVEN Microbiology Recent Results (from the past 240 hour(s))  SARS Coronavirus 2 by RT PCR (hospital order, performed in [place] hospital lab) Nasopharyngeal Nasopharyngeal Swab     Status: None   Collection Time: 02/18/20  2:47 PM   Specimen: Nasopharyngeal Swab  Result Value Ref Range Status   SARS Coronavirus 2 NEGATIVE NEGATIVE Final    Comment: (NOTE) SARS-CoV-2 target nucleic acids are NOT DETECTED. The SARS-CoV-2 RNA is generally detectable in upper and lower respiratory specimens during the acute phase of infection. The lowest concentration of SARS-CoV-2 viral copies this assay can detect is 250 copies / mL. A negative result does not preclude SARS-CoV-2 infection and should not be used as the sole basis for treatment or other patient management decisions.  A negative result may occur with improper specimen collection / handling, submission of specimen other than nasopharyngeal swab, presence of viral mutation(s) within the areas targeted by this assay, and inadequate number of viral copies (<250 copies / mL). A negative result must be combined with clinical observations, patient history, and epidemiological information. Fact Sheet for Patients:   StrictlyIdeas.no Fact Sheet for Healthcare Providers: BankingDealers.co.za This test is not yet approved or cleared  by the [place] FDA and has been authorized for  detection and/or diagnosis of SARS-CoV-2 by FDA under an Emergency Use Authorization (EUA).  This EUA will remain in effect (meaning this test can be used) for the duration of the COVID-19 declaration under Section 564(b)(1) of the Act, 21 U.S.C. section 360bbb-3(b)(1), unless the authorization is terminated or revoked sooner. Performed at [facility], [address] [place], [place] [address]      Time coordinating discharge: Over 30 minutes  SIGNED:   Sidney Ace, MD  [facility] 02/21/2020, 2:35 PM Pager   If 7PM-7AM, please contact night-coverage

## 2020-02-21 NOTE — Discharge Instructions (Signed)
Duodenitis  Duodenitis is inflammation of the lining of the first part of the small intestine (duodenum). It is commonly caused by an infection from bacteria, which may also lead to open sores (ulcers) in the intestine. Duodenitis may develop suddenly and last for a short time (acute), or it may develop gradually and last for months or years (chronic). What are the causes? The most common cause of duodenitis is an infection from a type of bacteria called Helicobacter pylori (H. pylori). Other causes of this condition include:  Long-term use of NSAIDs.  Excessive use of alcohol.  An infection of the small intestine caused by the Giardia parasite (giardiasis).  Crohn's disease.  Certain diseases of the body's defense system (immune system).  Certain treatments for cancer. What increases the risk? The following factors may make you more likely to develop this condition:  Smoking cigarettes.  Drinking alcohol.  Having a family history of duodenitis.  Taking NSAIDs.  Eating a high-fat diet. What are the signs or symptoms? Symptoms of this condition may include:  Gnawing or burning pain in the upper center of the abdomen (epigastric pain). This may get worse when the stomach is empty and may get better after eating.  Abdominal cramps.  Nausea and vomiting.  Bloody vomit.  Stools that are bloody, dark, or look like tar.  Diarrhea.  Weight loss.  Fatigue. How is this diagnosed? This condition may be diagnosed based on your medical history and a physical exam. You may also have tests, such as:  Blood tests.  Stool tests.  A test that checks the gases in your breath.  An X-ray that is done after you swallow a liquid (barium) that makes your digestive tract easier to see.  Endoscopy. This is an exam of the duodenum that is done by putting a thin tube with a tiny camera on the end (endoscope) down your throat. A sample of tissue from your duodenum (biopsy) may be  removed with the endoscope and examined under a microscope for signs of inflammation and infection. How is this treated? Treatment depends on the cause of your condition. Treatment may include:  Antibiotic medicine to treat H. pylori infection.  Stopping your intake of NSAIDs.  Medicine to reduce stomach acids.  Medicines to treat other conditions, such as Crohn's disease or giardiasis.  Surgery to treat severe inflammation that causes scarring or severe bleeding. Follow these instructions at home: Medicines  Take over-the-counter and prescription medicines only as told by your health care provider.  If you were prescribed an antibiotic medicine, take it as told by your health care provider. Do not stop taking the antibiotic even if you start to feel better. Eating and drinking   Eat small, frequent meals.  Do not drink alcohol.  Drink enough water to keep your urine pale yellow.  Follow instructions from your health care provider about eating or drinking restrictions. You may be asked to avoid: ? Caffeinated drinks. ? Chocolate. ? Peppermint or mint-flavored food or drinks. ? Garlic or onions. ? Spicy foods. ? Citrus fruits. ? Tomato-based foods. ? Fatty or fried foods. General instructions  Do not use any products that contain nicotine or tobacco, such as cigarettes and e-cigarettes. If you need help quitting, ask your health care provider.  Keep all follow-up visits as told by your health care provider. This is important. Contact a health care provider if:  You have a fever.  Your symptoms come back, get worse, or do not get better with treatment.  Get help right away if:  You vomit blood.  You have severe abdominal pain.  Your abdomen swells and is painful.  You have a lot of blood in your stool.  You feel dizzy or light-headed. Summary  Duodenitis is inflammation of the lining of the first part of the small intestine. This part of the small intestine is  called the duodenum.  Duodenitis may develop suddenly and last for a short time (acute), or it may develop gradually and last longer (chronic).  The most common cause of duodenitis is an infection from a type of bacteria.  Take over-the-counter and prescription medicines only as told by your health care provider. This information is not intended to replace advice given to you by your health care provider. Make sure you discuss any questions you have with your health care provider. Document Revised: 01/11/2018 Document Reviewed: 01/11/2018 Elsevier Patient Education  [place].

## 2020-02-21 NOTE — Plan of Care (Signed)
IV's have been removed. Voiding without problems. Education has been performed. Vital signs stable upon discharged.  Problem: Education: Goal: Knowledge of General Education information will improve Description: Including pain rating scale, medication(s)/side effects and non-pharmacologic comfort measures Outcome: Completed/Met   Problem: Health Behavior/Discharge Planning: Goal: Ability to manage health-related needs will improve Outcome: Completed/Met   Problem: Clinical Measurements: Goal: Ability to maintain clinical measurements within normal limits will improve Outcome: Completed/Met Goal: Will remain free from infection Outcome: Completed/Met Goal: Diagnostic test results will improve Outcome: Completed/Met Goal: Respiratory complications will improve Outcome: Completed/Met Goal: Cardiovascular complication will be avoided Outcome: Completed/Met   Problem: Activity: Goal: Risk for activity intolerance will decrease Outcome: Completed/Met   Problem: Nutrition: Goal: Adequate nutrition will be maintained Outcome: Completed/Met   Problem: Coping: Goal: Level of anxiety will decrease Outcome: Completed/Met   Problem: Elimination: Goal: Will not experience complications related to bowel motility Outcome: Completed/Met Goal: Will not experience complications related to urinary retention Outcome: Completed/Met   Problem: Pain Managment: Goal: General experience of comfort will improve Outcome: Completed/Met   Problem: Safety: Goal: Ability to remain free from injury will improve Outcome: Completed/Met   Problem: Skin Integrity: Goal: Risk for impaired skin integrity will decrease Outcome: Completed/Met

## 2020-02-22 ENCOUNTER — Telehealth: Payer: Self-pay | Admitting: Cardiovascular Disease

## 2020-02-22 ENCOUNTER — Inpatient Hospital Stay: Payer: Medicare Other | Admitting: Family Medicine

## 2020-02-22 NOTE — Telephone Encounter (Signed)
Patient wants to speak to Dr. Camillia Herter nurse personally. Would not disclose any information to me. He has an appt with Dr. Angelena Form on 02/25/20.

## 2020-02-22 NOTE — Telephone Encounter (Signed)
I spoke with the patient and addressed his questions re: his upcoming appointment with Dr. Angelena Form.

## 2020-02-25 ENCOUNTER — Other Ambulatory Visit: Payer: Self-pay

## 2020-02-25 ENCOUNTER — Ambulatory Visit (INDEPENDENT_AMBULATORY_CARE_PROVIDER_SITE_OTHER): Payer: Medicare Other | Admitting: Cardiovascular Disease

## 2020-02-25 ENCOUNTER — Encounter: Payer: Self-pay | Admitting: Cardiovascular Disease

## 2020-02-25 VITALS — BP 116/60 | HR 74 | Ht 69.0 in | Wt 221.0 lb

## 2020-02-25 DIAGNOSIS — I350 Nonrheumatic aortic (valve) stenosis: Secondary | ICD-10-CM | POA: Diagnosis not present

## 2020-02-25 NOTE — Progress Notes (Signed)
Structural Heart Clinic Consult Note  Chief Complaint  Patient presents with  . Follow-up    Severe aortic stenosis    History of Present Illness: 74 yo male with history of arthritis, COPD, depression, DM, HTN, lymphedema, morbid obesity, prostate cancer, pancreatic mass s/p Whipple in 2019, sleep apnea, tobacco abuse and severe aortic stenosis who is here today as a new patient in the structural heart clinic to discuss his severe aortic stenosis and possible TAVR. Echo 02/07/20 with LVEF=60-65%, mild LVH. The aortic valve leaflets are thickened and calcified with limited leaflet excursion. Mean gradient 37 mmHg, peak gradient 68 mmHg. AVA 0.82 cm2. Cardiac cath 02/21/20 with no CAD. Mean gradient 44 mmHg by cath. He underwent Whipple procedure 07/2018 at [facility] for IPMN. 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 He was started on Plavix.  He tells me today that he has progressive dyspnea and fatigue. No chest pain or LE edema. No dizziness. He is smoking 2 cigarettes per day. He does not have teeth. He does not have dentures. He lives in [facility] (low income senior housing). He is a retired Advertising account executive. He is not close with either of his children. He is here today with his friend who lives next door and drove him here.   Primary Care Physician: Verl Bangs, FNP Primary Cardiologist: Rockey Situ Referring Cardiologist: Rockey Situ  Past Medical History:  Diagnosis Date  . (HFpEF) heart failure with preserved ejection fraction ([place])    a. 08/2017 Echo: EF 60-65%, Gr1 DD.  [name] Arthritis   . Chest pain    a. 08/2017 Lexiscan MV: EF 53%, no ischemia-->Low risk.  [name] COPD (chronic obstructive pulmonary disease) ([place])   . Depression   . Diabetes mellitus without complication ([place])   . Dyspnea   . Heart murmur   . History of orthopnea   . Hypertension   . Lymphedema   . Moderate aortic stenosis     a. 08/2017 Echo: EF 60-65%, no rwma, GR1 DD, Mod Ao Stenosis, mean grad 45mHg. Valve area (VTI): 1.04 cm^2. Mildly dil LA.  . Morbid obesity ([place])   . Prostate cancer ([place]) 03/2013   ? PANCREATIC  . Severe aortic stenosis   . Sleep apnea   . Sleep apnea with use of continuous positive airway pressure (CPAP)   . Tobacco abuse   . Tremor    HEAD  . Wheezing    OCCAS    Past Surgical History:  Procedure Laterality Date  . ADENOIDECTOMY    . APPENDECTOMY    . CATARACT EXTRACTION W/PHACO Left 11/28/2018   Procedure: CATARACT EXTRACTION PHACO AND INTRAOCULAR LENS PLACEMENT (IOC) LEFT, DIABETIC;  Surgeon: Birder Robson, MD;  Location: [facility] ORS;  Service: Ophthalmology;  Laterality: Left;  [name]  00:56 CDE 9.44 Fluid pack lot # [id number]  . EUS N/A 10/13/2017   Procedure: FULL UPPER ENDOSCOPIC ULTRASOUND (EUS) RADIAL;  Surgeon: Holly Bodily, MD;  Location: [facility] ENDOSCOPY;  Service: Gastroenterology;  Laterality: N/A;  . PANCREATICODUODENECTOMY  07/23/2018  . RIGHT/LEFT HEART CATH AND CORONARY ANGIOGRAPHY N/A 02/21/2020   Procedure: RIGHT/LEFT HEART CATH AND CORONARY ANGIOGRAPHY;  Surgeon: Minna Merritts, MD;  Location: [place] CV LAB;  Service: Cardiovascular;  Laterality: N/A;  . TONSILLECTOMY      Current Outpatient Medications  Medication Sig Dispense Refill  . albuterol (PROVENTIL) (2.5 MG/3ML) 0.083% nebulizer solution Take 2.5 mg by  nebulization every 6 (six) hours as needed for wheezing or shortness of breath.    [name] albuterol (VENTOLIN HFA) 108 (90 Base) MCG/ACT inhaler Inhale 2 puffs into the lungs every 6 (six) hours as needed for wheezing or shortness of breath. 6.7 g 2  . aspirin EC 81 MG tablet Take 81 mg by mouth daily.    [name] atorvastatin (LIPITOR) 80 MG tablet Take 1 tablet (80 mg total) by mouth daily. 30 tablet 6  . carvedilol (COREG) 3.125 MG tablet Take 1 tablet (3.125 mg total) by mouth 2 (two) times daily. 60 tablet 5  . ciprofloxacin (CIPRO) 500 MG tablet  Take 1 tablet (500 mg total) by mouth 2 (two) times daily for 7 days. 14 tablet 0  . clopidogrel (PLAVIX) 75 MG tablet TAKE 1 TABLET BY MOUTH ONCE DAILY 90 tablet 0  . diclofenac (VOLTAREN) 75 MG EC tablet Take 75 mg by mouth 2 (two) times daily as needed for mild pain or moderate pain.     [name] docusate sodium (COLACE) 100 MG capsule Take 100 mg by mouth daily as needed for mild constipation.    . gabapentin (NEURONTIN) 100 MG capsule Take 100-200 mg by mouth at bedtime.     . hydrochlorothiazide (HYDRODIURIL) 25 MG tablet Take 1 tablet (25 mg total) by mouth daily. 30 tablet 5  . insulin glargine (LANTUS) 100 unit/mL SOPN Inject 20 Units into the skin at bedtime.    . metFORMIN (GLUCOPHAGE) 500 MG tablet Take 1,000 mg by mouth 2 (two) times daily.     . metroNIDAZOLE (FLAGYL) 500 MG tablet Take 1 tablet (500 mg total) by mouth every 8 (eight) hours for 7 days. 21 tablet 0  . pantoprazole (PROTONIX) 40 MG tablet Take 1 tablet (40 mg total) by mouth 2 (two) times daily. 60 tablet 11  . SYMBICORT 80-4.5 MCG/ACT inhaler INHALE 2 PUFFS BY MOUTH TWICE DAILY (Patient taking differently: Inhale 2 puffs into the lungs 2 (two) times daily. ) 10.2 g 3  . tiotropium (SPIRIVA HANDIHALER) 18 MCG inhalation capsule Place 1 capsule (18 mcg total) into inhaler and inhale daily. 30 capsule 5  . traZODone (DESYREL) 100 MG tablet Take 100 mg by mouth at bedtime as needed for sleep.     No current facility-administered medications for this visit.    Allergies  Allergen Reactions  . Codeine Itching    Social History   Socioeconomic History  . Marital status: Widowed    Spouse name: Not on file  . Number of children: 2  . Years of education: Not on file  . Highest education level: Not on file  Occupational History  . Occupation: retired Social research officer, government  Tobacco Use  . Smoking status: Current Some Day Smoker    Packs/day: 0.25    Years: 68.00    Pack years: 17.00    Types: Cigarettes  . Smokeless  tobacco: Former Systems developer    Types: Chew, [place] date: 2000  . Tobacco comment: 5 cigs daily 01/29/20/ER  Substance and Sexual Activity  . Alcohol use: Not Currently    Alcohol/week: 0.0 - 1.0 standard drinks    Comment: quit 40years ago  . Drug use: No  . Sexual activity: Not Currently  Other Topics Concern  . Not on file  Social History Narrative   Living at [place] Strain:   . Difficulty of Paying Living Expenses:   Food  Insecurity:   . Worried About Charity fundraiser in the Last Year:   . Arboriculturist in the Last Year:   Transportation Needs:   . Film/video editor (Medical):   [name] Lack of Transportation (Non-Medical):   Physical Activity:   . Days of Exercise per Week:   . Minutes of Exercise per Session:   Stress:   . Feeling of Stress :   Social Connections:   . Frequency of Communication with Friends and Family:   . Frequency of Social Gatherings with Friends and Family:   . Attends Religious Services:   . Active Member of Clubs or Organizations:   . Attends Archivist Meetings:   [name] Marital Status:   Intimate Partner Violence:   . Fear of Current or Ex-Partner:   . Emotionally Abused:   [name] Physically Abused:   . Sexually Abused:     Family History  Problem Relation Age of Onset  . Breast cancer Mother   . Cancer Father        Black Lung  . Cerebral palsy Brother   . Tuberculosis Paternal Uncle   . Prostate cancer Neg Hx   . Kidney cancer Neg Hx   . Bladder Cancer Neg Hx     Review of Systems:  As stated in the HPI and otherwise negative.   BP 116/60   Pulse 74   Ht 5\' 9"  (1.753 m)   Wt 221 lb (100.2 kg)   SpO2 96%   BMI 32.64 kg/m   Physical Examination: General: Well developed, well nourished, NAD  HEENT: OP clear, mucus membranes moist  SKIN: warm, dry. No rashes. Neuro: No focal deficits  Musculoskeletal: Muscle strength 5/5 all ext  Psychiatric: Mood and  affect normal  Neck: No JVD, no carotid bruits, no thyromegaly, no lymphadenopathy.  Lungs:Clear bilaterally, no wheezes, rhonci, crackles Cardiovascular: Regular rate and rhythm. Loud, harsh, late peaking systolic murmur.  Abdomen:Soft. Bowel sounds present. Non-tender.  Extremities: No lower extremity edema. Pulses are 2 + in the bilateral DP/PT.  EKG:  EKG is not ordered today. The ekg ordered today demonstrates   Echo 02/07/20: 1. Left ventricular ejection fraction, by estimation, is 60 to 65%. The  left ventricle has normal function. The left ventricle has no regional  wall motion abnormalities. There is mild left ventricular hypertrophy.  Left ventricular diastolic parameters  are consistent with Grade I diastolic dysfunction (impaired relaxation).  2. Right ventricular systolic function is normal. The right ventricular  size is normal.  3. Left atrial size was mildly dilated.  4. The mitral valve is normal in structure. No evidence of mitral valve  regurgitation.  5. Aortic valve DVI = 0.23, Peak gradient 91mmHg, Mean Gradient 37, peak  velocity 4.47m/s. Unable to determine valve morphology due to image  quality. Aortic valve regurgitation is not visualized. Severe aortic valve  stenosis. Aortic valve area, by VTI  measures 0.85 cm.  6. Aortic root is upper limit of normal in size.   Comparison(s):   FINDINGS  Left Ventricle: Left ventricular ejection fraction, by estimation, is 60  to 65%. The left ventricle has normal function. The left ventricle has no  regional wall motion abnormalities. The left ventricular internal cavity  size was normal in size. There is  mild left ventricular hypertrophy. Left ventricular diastolic parameters  are consistent with Grade I diastolic dysfunction (impaired relaxation).   Right Ventricle: The right ventricular size is normal. No increase in  right  ventricular wall thickness. Right ventricular systolic function is  normal.    Left Atrium: Left atrial size was mildly dilated.   Right Atrium: Right atrial size was normal in size.   Pericardium: There is no evidence of pericardial effusion.   Mitral Valve: The mitral valve is normal in structure. No evidence of  mitral valve regurgitation.   Tricuspid Valve: The tricuspid valve is grossly normal. Tricuspid valve  regurgitation is not demonstrated.   Aortic Valve: Aortic valve DVI = 0.23, Peak gradient 32mmHg, Mean Gradient  37, peak velocity 4.2m/s. Unable to determine valve morphology due to  image quality. Aortic valve regurgitation is not visualized. Severe aortic  stenosis is present. Aortic valve  mean gradient measures 34.5 mmHg. Aortic valve peak gradient measures  66.6 mmHg. Aortic valve area, by VTI measures 0.85 cm.   Pulmonic Valve: The pulmonic valve was not well visualized. Pulmonic valve  regurgitation is not visualized.   Aorta: Root is upper limit of normal in size.   Venous: The inferior vena cava was not well visualized.   IAS/Shunts: No atrial level shunt detected by color flow Doppler.     LEFT VENTRICLE  PLAX 2D  LVIDd:     4.50 cm   Diastology  LVIDs:     2.40 cm   LV e' lateral:  7.94 cm/s  LV PW:     1.70 cm   LV E/e' lateral: 9.2  LV IVS:    1.40 cm   LV e' medial:  5.55 cm/s  LVOT diam:   2.10 cm   LV E/e' medial: 13.1  LV SV:     75  LV SV Index:  35  LVOT Area:   3.46 cm    LV Volumes (MOD)  LV vol d, MOD A2C: 75.1 ml  LV vol d, MOD A4C: 121.0 ml  LV vol s, MOD A2C: 16.2 ml  LV vol s, MOD A4C: 29.4 ml  LV SV MOD A2C:   58.9 ml  LV SV MOD A4C:   121.0 ml  LV SV MOD BP:   79.0 ml   RIGHT VENTRICLE  RV S prime:   16.80 cm/s  TAPSE (M-mode): 2.9 cm   LEFT ATRIUM       Index    RIGHT ATRIUM      Index  LA diam:    4.20 cm 1.95 cm/m RA Area:   19.20 cm  LA Vol (A2C):  77.7 ml 36.05 ml/m RA Volume:  56.00 ml 25.98 ml/m  LA  Vol (A4C):  90.9 ml 42.17 ml/m  LA Biplane Vol: 88.3 ml 40.96 ml/m  AORTIC VALVE          PULMONIC VALVE  AV Area (Vmax):  0.82 cm   PV Vmax:    1.00 m/s  AV Area (Vmean):  0.85 cm   PV Peak grad: 4.0 mmHg  AV Area (VTI):   0.85 cm  AV Vmax:      408.00 cm/s  AV Vmean:     274.500 cm/s  AV VTI:      0.886 m  AV Peak Grad:   66.6 mmHg  AV Mean Grad:   34.5 mmHg  LVOT Vmax:     96.70 cm/s  LVOT Vmean:    67.700 cm/s  LVOT VTI:     0.217 m  LVOT/AV VTI ratio: 0.25    AORTA  Ao Root diam: 3.60 cm  Ao Asc diam: 3.70 cm   MITRAL VALVE  TRICUSPID VALVE  MV Area (PHT): 2.54 cm  TR Peak grad:  6.0 mmHg  MV Decel Time: 299 msec  TR Vmax:    122.00 cm/s  MV E velocity: 72.70 cm/s  MV A velocity: 99.50 cm/s SHUNTS  MV E/A ratio: 0.73    Systemic VTI: 0.22 m               Systemic Diam: 2.10 cm   Recent Labs: 02/18/2020: ALT 34 02/21/2020: BUN 10; Creatinine, Ser 0.92; Hemoglobin 9.9; Magnesium 1.8; Platelets 197; Potassium 3.7; Sodium 138   Lipid Panel    Component Value Date/Time   CHOL 149 02/08/2020 1228   TRIG 108 02/08/2020 1228   HDL 38 (L) 02/08/2020 1228   CHOLHDL 3.9 02/08/2020 1228   CHOLHDL 3.6 12/06/2019 1420   LDLCALC 91 02/08/2020 1228   LDLCALC 82 12/06/2019 1420     Wt Readings from Last 3 Encounters:  02/25/20 221 lb (100.2 kg)  02/21/20 219 lb 12.8 oz (99.7 kg)  02/08/20 217 lb (98.4 kg)     Other studies Reviewed: Additional studies/ records that were reviewed today include: echo images, cath images, office notes Review of the above records demonstrates: severe AS   Assessment and Plan:   1. Severe Aortic Valve Stenosis: He has severe, stage D aortic valve stenosis. I have personally reviewed the echo images. The aortic valve is thickened, calcified with limited leaflet mobility. I think he would benefit from AVR. Given advanced age, he is not a  good candidate for conventional AVR by surgical approach. I think he may be a good candidate for TAVR.   STS Risk Score: Risk of Mortality: 1.787% Renal Failure: 3.193% Permanent Stroke: 1.772% Prolonged Ventilation: 6.233% DSW Infection: 0.191% Reoperation: 3.688% Morbidity or Mortality: 11.770% Short Length of Stay: 37.777% Long Length of Stay: 4.391%  I have reviewed the natural history of aortic stenosis with the patient and their family members  who are present today. We have discussed the limitations of medical therapy and the poor prognosis associated with symptomatic aortic stenosis. We have reviewed potential treatment options, including palliative medical therapy, conventional surgical aortic valve replacement, and transcatheter aortic valve replacement. We discussed treatment options in the context of the patient's specific comorbid medical conditions.   He would like to proceed with planning for TAVR. He has had his cath. Risks and benefits of the valve procedure are reviewed with the patient. We will arrange a cardiac CT, CTA of the chest/abdomen and pelvis, carotid artery dopplers, PT assessment and he will then be referred to see one of the CT surgeons on our TAVR team.      Current medicines are reviewed at length with the patient today.  The patient does not have concerns regarding medicines.  The following changes have been made:  no change  Labs/ tests ordered today include:  No orders of the defined types were placed in this encounter.    Disposition:   FU with the valve team.    Signed, Lauree Chandler, MD 02/25/2020 3:19 PM    [address], [place], [place]  [address] Phone: [phone]; Fax: [phone]

## 2020-02-25 NOTE — Patient Instructions (Signed)
Medication Instructions:  No changes *If you need a refill on your cardiac medications before your next appointment, please call your pharmacy*   Lab Work: none If you have labs (blood work) drawn today and your tests are completely normal, you will receive your results only by: [name] MyChart Message (if you have MyChart) OR . A paper copy in the mail If you have any lab test that is abnormal or we need to change your treatment, we will call you to review the results.   Testing/Procedures: none   Other Instructions  Theodosia Quay, RN Structural Heart Navigator will contact you regarding your next appointments.

## 2020-02-27 ENCOUNTER — Telehealth: Payer: Self-pay | Admitting: Cardiovascular Disease

## 2020-02-27 NOTE — Telephone Encounter (Signed)
Patient had recent visit [facility] and wants to know if upcoming visit with NP is needed .  Please advise.

## 2020-02-28 NOTE — Telephone Encounter (Signed)
Spoke with patient and reviewed that we could cancel his appointment for now and to make sure he completes additional testing that was ordered and follow up as well. He verbalized understanding with no further questions at this time.

## 2020-02-28 NOTE — Telephone Encounter (Signed)
No need to see me, okay to cancel 03/21/20 appointment.   Saw [facility] and has been referred to valve team for potential TAVR. Would recommend he call us back if he does not heart from the valve team to schedule his CT/follow up in the next 1-2 weeks.   Loel Dubonnet, NP

## 2020-02-29 ENCOUNTER — Telehealth: Payer: Medicare Other

## 2020-02-29 ENCOUNTER — Ambulatory Visit: Payer: Self-pay | Admitting: Pharmacist

## 2020-02-29 NOTE — Chronic Care Management (AMB) (Signed)
  Chronic Care Management   Follow Up Note   02/29/2020 Name: Nicco Reaume MRN: [id number] DOB: 1946-05-13  Referred by: Verl Bangs, FNP Reason for referral : Chronic Care Management (Patient Phone Call)   Rubens Cranston is a 74 y.o. year old male who is a primary care patient of Lorine Bears, [name], [place]. The CCM team was consulted for assistance with chronic disease management and care coordination needs.    Was unable to reach patient via telephone today and unable to leave a message as patient's voicemail is full.  Plan  The care management team will reach out to the patient again over the next 30 days.   Harlow Asa, PharmD, [place] [employer] [phone]

## 2020-03-04 ENCOUNTER — Other Ambulatory Visit: Payer: Self-pay | Admitting: Nurse Practitioner

## 2020-03-04 ENCOUNTER — Encounter: Payer: Self-pay | Admitting: Physician Assistant

## 2020-03-04 ENCOUNTER — Other Ambulatory Visit: Payer: Self-pay | Admitting: Physician Assistant

## 2020-03-04 DIAGNOSIS — J431 Panlobular emphysema: Secondary | ICD-10-CM

## 2020-03-04 DIAGNOSIS — I350 Nonrheumatic aortic (valve) stenosis: Secondary | ICD-10-CM

## 2020-03-11 ENCOUNTER — Ambulatory Visit: Payer: Medicare Other | Attending: Physician Assistant | Admitting: Physical Therapy

## 2020-03-11 ENCOUNTER — Ambulatory Visit (HOSPITAL_COMMUNITY)
Admission: RE | Admit: 2020-03-11 | Discharge: 2020-03-11 | Disposition: A | Discharge status: Home / Self Care | Payer: Medicare Other | Source: Ambulatory Visit | Attending: Physician Assistant | Admitting: Physician Assistant

## 2020-03-11 ENCOUNTER — Encounter: Payer: Self-pay | Admitting: Thoracic Surgery (Cardiothoracic Vascular Surgery)

## 2020-03-11 ENCOUNTER — Ambulatory Visit (HOSPITAL_BASED_OUTPATIENT_CLINIC_OR_DEPARTMENT_OTHER)
Admission: RE | Admit: 2020-03-11 | Discharge: 2020-03-11 | Disposition: A | Discharge status: Home / Self Care | Payer: Medicare Other | Source: Ambulatory Visit | Attending: Physician Assistant | Admitting: Physician Assistant

## 2020-03-11 ENCOUNTER — Encounter: Payer: Self-pay | Admitting: Physical Therapy

## 2020-03-11 ENCOUNTER — Other Ambulatory Visit: Payer: Self-pay

## 2020-03-11 DIAGNOSIS — R911 Solitary pulmonary nodule: Secondary | ICD-10-CM | POA: Diagnosis present

## 2020-03-11 DIAGNOSIS — I350 Nonrheumatic aortic (valve) stenosis: Secondary | ICD-10-CM | POA: Diagnosis not present

## 2020-03-11 DIAGNOSIS — R2689 Other abnormalities of gait and mobility: Secondary | ICD-10-CM | POA: Diagnosis not present

## 2020-03-11 HISTORY — DX: Solitary pulmonary nodule: R91.1

## 2020-03-11 MED ORDER — IOHEXOL 350 MG/ML SOLN
100.0000 mL | Freq: Once | INTRAVENOUS | Status: AC | PRN
  Administered 2020-03-11: 100 mL via INTRAVENOUS

## 2020-03-11 NOTE — Therapy (Signed)
[place], [place], [address] Phone: [phone]   Fax:  [phone]  Physical Therapy Evaluation  Patient Details  Name: Nathaniel Little MRN: [id number] Date of Birth: 1946-01-26 Referring Provider (PT): Eileen Stanford, [place]   Encounter Date: 03/11/2020  PT End of Session - 03/11/20 1222    Visit Number  1    Number of Visits  1    Date for PT Re-Evaluation  03/11/20    PT Start Time  1225    PT Stop Time  1252    PT Time Calculation (min)  27 min    Activity Tolerance  Patient tolerated treatment well    Behavior During Therapy  [facility] for tasks assessed/performed       Past Medical History:  Diagnosis Date  . (HFpEF) heart failure with preserved ejection fraction ([place])    a. 08/2017 Echo: EF 60-65%, Gr1 DD.  [name] Arthritis   . Chest pain    a. 08/2017 Lexiscan MV: EF 53%, no ischemia-->Low risk.  [name] COPD (chronic obstructive pulmonary disease) ([place])   . Depression   . Diabetes mellitus without complication ([place])   . Dyspnea   . Heart murmur   . History of orthopnea   . Hypertension   . Lymphedema   . Moderate aortic stenosis    a. 08/2017 Echo: EF 60-65%, no rwma, GR1 DD, Mod Ao Stenosis, mean grad 8mHg. Valve area (VTI): 1.04 cm^2. Mildly dil LA.  . Morbid obesity ([place])   . Prostate cancer ([place]) 03/2013   ? PANCREATIC  . Severe aortic stenosis   . Sleep apnea   . Sleep apnea with use of continuous positive airway pressure (CPAP)   . Tobacco abuse   . Tremor    HEAD  . Wheezing    OCCAS    Past Surgical History:  Procedure Laterality Date  . ADENOIDECTOMY    . APPENDECTOMY    . CATARACT EXTRACTION W/PHACO Left 11/28/2018   Procedure: CATARACT EXTRACTION PHACO AND INTRAOCULAR LENS PLACEMENT (IOC) LEFT, DIABETIC;  Surgeon: Birder Robson, MD;  Location: [facility] ORS;  Service: Ophthalmology;  Laterality: Left;  [name]  00:56 CDE 9.44 Fluid pack lot # [id number]  . EUS N/A 10/13/2017    Procedure: FULL UPPER ENDOSCOPIC ULTRASOUND (EUS) RADIAL;  Surgeon: Holly Bodily, MD;  Location: [facility] ENDOSCOPY;  Service: Gastroenterology;  Laterality: N/A;  . PANCREATICODUODENECTOMY  07/23/2018  . RIGHT/LEFT HEART CATH AND CORONARY ANGIOGRAPHY N/A 02/21/2020   Procedure: RIGHT/LEFT HEART CATH AND CORONARY ANGIOGRAPHY;  Surgeon: Minna Merritts, MD;  Location: [facility] CV LAB;  Service: Cardiovascular;  Laterality: N/A;  . TONSILLECTOMY      There were no vitals filed for this visit.   Subjective Assessment - 03/11/20 1228    Subjective  pt is 74 y.o M with CC of dyspnea and general fatigue that has progressively worsened over the past couple months. He reports hx of R hip OA that limits walking which he currently ambulates using a rollator.    Patient Stated Goals  to get the heart    Currently in Pain?  Yes    Pain Score  4     Pain Location  Hip    Pain Orientation  Left    Pain Descriptors / Indicators  Aching;Sore    Pain Type  Chronic pain    Pain Onset  More than a month ago    Pain Frequency  Intermittent    Aggravating Factors  standing/ walking    Pain Relieving Factors  sitting         [facility] PT Assessment - 03/11/20 0001      Assessment   Medical Diagnosis  Severe Aortic stenosis    Referring Provider (PT)  Eileen Stanford, PA-C    Onset Date/Surgical Date  --   last few months   Hand Dominance  Right    Prior Therapy  yes      Precautions   Precautions  None      Restrictions   Weight Bearing Restrictions  No      Balance Screen   Has the patient fallen in the past 6 months  No    Has the patient had a decrease in activity level because of a fear of falling?   No    Is the patient reluctant to leave their home because of a fear of falling?   No      Home Social worker  Private residence    Living Arrangements  Alone    Available Help at Discharge  Neighbor;Friend(s)    Type of [place]  One level    [place] - single point;Wheelchair - manual;Grab bars - tub/shower;Shower seat - built in   rollatore     ROM / Strength   AROM / PROM / Strength  AROM;PROM;Strength      AROM   Overall AROM   Within functional limits for tasks performed      Strength   Overall Strength  Within functional limits for tasks performed    Overall Strength Comments  4-/5 in R hip flexors    Strength Assessment Site  Hand    Right Hand Grip (lbs)  80    Left Hand Grip (lbs)  62      Ambulation/Gait   Ambulation/Gait  Yes    Assistive device  Rollator    Gait Pattern  Step-through pattern;Antalgic;Trendelenburg    Gait Comments  pt ambulated 740 ft before requiring seated rest break lasting 1:15 seconds with  95% O2 and HR at 107. He resumed test finishing the reminder of the test walking and additional 140 ft.        [facility] Pre-Surgical Assessment - 03/11/20 0001    5 Meter Walk Test- trial 1  5 sec   all performed using rollator   5 Meter Walk Test- trial 2  4 sec.     5 Meter Walk Test- trial 3  5 sec.    5 meter walk test average  4.67 sec    4 Stage Balance Test tolerated for:   10 sec.    4 Stage Balance Test Position  3    Comment  pt reports he feels it can due to hip pain    ADL/IADL Independent with:  Dressing;Bathing;Finances;Meal prep    ADL/IADL Needs Assistance with:  Valla Leaver work    ADL/IADL Fraility Index  Vulnerable    6 Minute Walk- Baseline  yes    BP (mmHg)  132/71    HR (bpm)  78    02 Sat (%RA)  98 %    Modified Borg Scale for Dyspnea  2- Mild shortness of breath    Perceived Rate of Exertion (Borg)  6-    6 Minute Walk Post Test  yes    BP (mmHg)  155/76    HR (bpm)  107    02 Sat (%RA)  95 %    Modified Borg Scale for Dyspnea  3- Moderate shortness of breath or breathing difficulty    Perceived Rate of Exertion (Borg)  13- Somewhat hard    Aerobic Endurance Distance Walked  880    Endurance additional comments  pt is  75.10% limited compared to age related norm                Objective measurements completed on examination: See above findings.                           Plan - 03/11/20 1222    Clinical Impression Statement  see assessment in note    Stability/Clinical Decision Making  Stable/Uncomplicated    PT Frequency  One time visit    PT Next Visit Plan  Pre-TAVR    Consulted and Agree with Plan of Care  Patient       Clinical Impression Statement: Pt is a 74 yo M presenting to OP PT for evaluation prior to possible TAVR surgery due to severe aortic stenosis. Pt reports onset of SOB and general fatigue approximately 4 months ago. Symptoms are limiting endurance. Pt presents with good ROM and strength, fair balance and is assessed as low at high fall risk 4 stage balance test, good walking speed and limited aerobic endurance per 6 minute walk test. pt ambulated 740 ft before requiring seated rest break lasting 1:15 seconds with  95% O2 and HR at 107. He resumed test finishing the reminder of the test walking and additional 140 ft. on room air. Pt reported 3/10 shortness of breath on modified scale for dyspnea.  Pt ambulated a total of 880 feet in 6 minute walk. SOB, fatigue and hip pain increased significantly with 6 minute walk test. Based on the Short Physical Performance Battery, patient has a frailty rating of 8/12 with </= 5/12 considered frail.    Patient demonstrated the following deficits and impairments:     Visit Diagnosis: Other abnormalities of gait and mobility     Problem List Patient Active Problem List   Diagnosis Date Noted  . Duodenitis 02/18/2020  . Diabetes ([place]) 12/06/2019  . Increased frequency of urination 12/06/2019  . Erectile dysfunction due to arterial insufficiency 09/24/2019  . Lumbar degenerative disc disease 06/25/2019  . Sacroiliac joint dysfunction of both sides 06/25/2019  . Moderate aortic stenosis 01/19/2019  . Chronic  heart failure with preserved ejection fraction ([place]) 01/19/2019  . Tobacco abuse 01/19/2019  . Hyperlipidemia 07/20/2018  . IPMN (intraductal papillary mucinous neoplasm) 12/24/2017  . Claudication in peripheral vascular disease ([place]) 08/22/2017  . Chest pain 08/22/2017  . Pancreatic mass 08/20/2017  . Aortic atherosclerosis ([place]) 07/25/2017  . BMI 38.0-38.9,adult 02/09/2017  . Arthritis pain of hip 01/07/2017  . Chronic obstructive pulmonary disease ([place]) 01/07/2017  . GERD (gastroesophageal reflux disease) 01/07/2017  . Inability to attain erection 01/07/2017  . Tobacco smoker within last 12 months 01/07/2017  . Essential hypertension 01/07/2017  . Heart murmur, systolic 23/55/7322  . Depression 01/06/2017  . Sleep apnea with use of continuous positive airway pressure (CPAP) 01/06/2017  . Personal history of prostate cancer 03/04/2013   [place] PT, DPT, LAT, ATC  03/11/20  1:01 PM      [facility] [address] [place], [place], [address] Phone: [phone]   Fax:  [phone]  Name: Fedor Kazmierski MRN: [id number]  Date of Birth: September 30, 1946

## 2020-03-11 NOTE — Progress Notes (Signed)
Carotid artery duplex completed. Refer to "CV Proc" under chart review to view preliminary results.  03/11/2020 9:37 AM Kelby Aline., MHA, RVT, RDCS, RDMS

## 2020-03-12 ENCOUNTER — Telehealth: Payer: Self-pay | Admitting: Licensed Clinical Social Worker

## 2020-03-12 ENCOUNTER — Telehealth: Payer: Self-pay

## 2020-03-12 DIAGNOSIS — R911 Solitary pulmonary nodule: Secondary | ICD-10-CM

## 2020-03-12 DIAGNOSIS — R9389 Abnormal findings on diagnostic imaging of other specified body structures: Secondary | ICD-10-CM

## 2020-03-12 NOTE — Telephone Encounter (Signed)
PET scan is scheduled 6/14 at [facility].  Medication instructions reviewed to take half dose of Lantus night before and to hold Metformin and HCTZ morning of test. He understands no carbs 12 hours prior and only water 6 hours prior to scan. He understands Kennyth Lose will contact him to arrange transportation. Message sent to [facility] to arrange.  He was grateful for assistance.

## 2020-03-12 NOTE — Telephone Encounter (Signed)
Nell Range personally spoke with patient and he agrees to PET scan.  STAT order placed. Will arrange once precert is complete.

## 2020-03-12 NOTE — Telephone Encounter (Signed)
CSW contacted patient to arrange transport for procedure on Monday. Message left for return call. Nathaniel Little, [place], [place]

## 2020-03-12 NOTE — Telephone Encounter (Signed)
-----   Message from Eileen Stanford, PA-C sent at 03/11/2020  5:28 PM EDT ----- I will call him in the AM . Thank you for the help! ----- Message ----- From: Rexene Alberts, MD Sent: 03/11/2020   5:14 PM EDT To: Barkley Boards, RN, Eileen Stanford, PA-C  Patient should be told results and scheduled for PET-CT scan as soon as possible Is he a smoker? ----- Message ----- From: Eileen Stanford, PA-C Sent: 03/11/2020   2:42 PM EDT To: Barkley Boards, RN, Rexene Alberts, MD  This pt has a possible bronchogenic neoplasm. He has an appt with Dr. Roxy Manns on 6/16- potential TAVR for 6/22. Can you please review?

## 2020-03-13 ENCOUNTER — Telehealth: Payer: Self-pay | Admitting: Licensed Clinical Social Worker

## 2020-03-13 NOTE — Telephone Encounter (Signed)
CSW received message that patient will not need transport to procedure next week. CSW available as needed. Raquel Sarna, [place], [place]

## 2020-03-17 ENCOUNTER — Other Ambulatory Visit: Payer: Self-pay

## 2020-03-17 ENCOUNTER — Ambulatory Visit
Admission: RE | Admit: 2020-03-17 | Discharge: 2020-03-17 | Disposition: A | Discharge status: Home / Self Care | Payer: Medicare Other | Source: Ambulatory Visit | Attending: Physician Assistant | Admitting: Physician Assistant

## 2020-03-17 DIAGNOSIS — R9389 Abnormal findings on diagnostic imaging of other specified body structures: Secondary | ICD-10-CM | POA: Insufficient documentation

## 2020-03-17 DIAGNOSIS — R911 Solitary pulmonary nodule: Secondary | ICD-10-CM | POA: Insufficient documentation

## 2020-03-17 LAB — GLUCOSE, CAPILLARY: Glucose-Capillary: 143 mg/dL — ABNORMAL HIGH (ref 70–99)

## 2020-03-17 MED ORDER — FLUDEOXYGLUCOSE F - 18 (FDG) INJECTION
11.5000 | Freq: Once | INTRAVENOUS | Status: AC | PRN
  Administered 2020-03-17: 12.32 via INTRAVENOUS

## 2020-03-18 ENCOUNTER — Telehealth: Payer: Self-pay

## 2020-03-18 NOTE — Telephone Encounter (Signed)
Copied from [place] [id number]. Topic: General - Other >> Mar 17, 2020  2:52 PM Alanda Slim E wrote: Reason for CRM: Pt wanted to let Elmyra Ricks know that this Wednesday he will be having a stent put in place at his heart Dr/ if Elmyra Ricks has any question please contact Pt

## 2020-03-19 ENCOUNTER — Encounter: Payer: Medicare Other | Admitting: Thoracic Surgery (Cardiothoracic Vascular Surgery)

## 2020-03-19 ENCOUNTER — Other Ambulatory Visit: Payer: Medicare Other

## 2020-03-20 ENCOUNTER — Other Ambulatory Visit (HOSPITAL_COMMUNITY): Payer: Medicare Other

## 2020-03-21 ENCOUNTER — Ambulatory Visit: Payer: Medicare Other | Admitting: Family

## 2020-03-21 ENCOUNTER — Telehealth: Payer: Self-pay | Admitting: Thoracic Surgery (Cardiothoracic Vascular Surgery)

## 2020-03-21 ENCOUNTER — Other Ambulatory Visit: Payer: Self-pay

## 2020-03-21 ENCOUNTER — Institutional Professional Consult (permissible substitution) (INDEPENDENT_AMBULATORY_CARE_PROVIDER_SITE_OTHER): Payer: Medicare Other | Admitting: Thoracic Surgery (Cardiothoracic Vascular Surgery)

## 2020-03-21 ENCOUNTER — Encounter: Payer: Self-pay | Admitting: Thoracic Surgery (Cardiothoracic Vascular Surgery)

## 2020-03-21 VITALS — BP 132/73 | HR 79 | Temp 97.6°F | Resp 20 | Ht 69.0 in | Wt 213.0 lb

## 2020-03-21 DIAGNOSIS — I350 Nonrheumatic aortic (valve) stenosis: Secondary | ICD-10-CM | POA: Diagnosis not present

## 2020-03-21 NOTE — Patient Instructions (Addendum)
Stop taking Plavix 7 days prior to surgery.  Stop taking metformin 2 days prior to surgery.  Continue taking all other medications without change through the day before surgery.  Make sure to bring all of your medications with you when you come for your Pre-Admission Testing appointment at [facility] Short-Stay Department.  Have nothing to eat or drink after midnight the night before surgery.  On the morning of surgery take only Protonix with a sip of water.  At your appointment for Pre-Admission Testing at the [facility] Short-Stay Department you will be asked to sign permission forms for your upcoming surgery.  By definition your signature on these forms implies that you and/or your designee provide full informed consent for your planned surgical procedure(s), that alternative treatment options have been discussed, that you understand and accept any and all potential risks, and that you have some understanding of what to expect for your post-operative convalescence.  For any major cardiac surgical procedure potential operative risks include but are not limited to at least some risk of death, stroke or other neurologic complication, myocardial infarction, congestive heart failure, respiratory failure, renal failure, bleeding requiring blood transfusion and/or reexploration, irregular heart rhythm, heart block or bradycardia requiring permanent pacemaker, pneumonia, pericardial effusion, pleural effusion, wound infection, pulmonary embolus or other thromboembolic complication, chronic pain, or other complications related to the specific procedure(s) performed.  For transcatheter aortic valve replacement additional risks include but are not limited to risk of paravalvular leak, valve embolization, valve thrombosis, aortic dissection, aortic rupture, ventricular septal defect or perforation, pericardial tamponade, injury of the abdominal aorta or its branches, and/or  injury or occlusion of the arteries going to your arms or legs.  Please call to schedule a follow-up appointment in our office prior to surgery if you have any unresolved questions about your planned surgical procedure, the associated risks, alternative treatment options, and/or expectations for your post-operative recovery.

## 2020-03-21 NOTE — H&P (View-Only) (Signed)
HEART AND [place] SURGERY CONSULTATION REPORT  Primary Cardiologist is Ida Rogue, MD PCP is [name], [name], FNP  Chief Complaint  Patient presents with  . Aortic Stenosis    Surgical eval for TAVR, review all testing/    HPI:  Patient is a 74 year old male with history of aortic stenosis, chronic diastolic congestive heart failure with preserved ejection fraction, longstanding tobacco abuse with COPD, type 2 diabetes mellitus without complications, hypertension, left lower lobe pulmonary nodule suspicious for bronchogenic neoplasm, obstructive sleep apnea on CPAP, obesity, and intraductal papillary mucinous neoplasm of the pancreas status post Whipple procedure in 2019 who has been referred for surgical consultation to discuss treatment options for management of severe symptomatic aortic stenosis.  The patient has been followed by Dr. Rockey Situ intermittently ever since.  Patient was first evaluated by a cardiologist in 2018 for symptoms of exertional shortness of breath.  Stress test was felt to be low risk for coronary ischemia and echocardiogram revealed normal left ventricular systolic function with moderate aortic stenosis.  Symptoms were attributed to heart failure with preserved ejection fraction and COPD.    Patient underwent Whipple procedure in October 2019 at [facility] for intraductal papillary mucinous neoplasm of the pancreas.  The common and proper hepatic arteries were found to be dissected and occluded during surgery and ultimately the patient underwent stent placement in the gastroduodenal artery and hepatic arteries.  He was started on Plavix at that time.  The patient underwent follow-up echocardiogram Feb 07, 2020 revealing significant progression in the severity of aortic stenosis with preserved left ventricular systolic function.  Peak velocity across aortic valve was measured 4.1 m/s  corresponding to mean transvalvular gradient estimated 37 mmHg.  DVI was reported 0.23 and aortic valve area calculated 0.85 cm by VTI.  Left ventricular systolic function remain preserved with ejection fraction estimated 65%.  Stroke-volume index was reported 35.  The patient was referred to the multidisciplinary heart valve clinic for management.  Shortly after that the patient was hospitalized briefly at [facility] with abdominal pain, elevated white blood count, and mild lactic acidosis.  CT scan of the abdomen and pelvis revealed "considerable peripancreatic and duodenal inflammatory change consistent with some acute pancreatitis or duodenitis.  The patient was treated with ciprofloxacin and Flagyl.  Symptoms resolved.  During his hospitalization he was evaluated by cardiology and he underwent diagnostic cardiac catheterization by Dr. Rockey Situ on Feb 21, 2020.  Catheterization revealed nonobstructive coronary artery disease with normal left ventricular systolic function and severe aortic stenosis.  Mean transvalvular gradient across the aortic valve was measured 45 mmHg corresponding to valve area calculated 0.95 cm.  There was very mild elevation of right-sided pressures.  Patient was seen in consultation by Dr. Angelena Form and CT angiography was performed.  CT scan of the chest revealed small pulmonary nodule in the left lower lobe suspicious for bronchogenic neoplasm.  Cardiothoracic surgical consultation was requested.  Patient is a widower and lives alone in [place].  He has 2 adult children 1 of whom lives in [place] and the other lives in [place].  He apparently is not close with either.  Patient has remained functionally independent although he no longer drives a car.  His mobility is somewhat limited.  He ambulates using a cane, walker, or wheelchair.  He arranges for transportation to and from [place] appointments through his insurance company.  He has a long history of symptoms of exertional  shortness  of breath that have progressed somewhat.  He denies exertional chest pain or chest tightness.  He denies resting shortness of breath, PND, orthopnea, or lower extremity edema.  He has not had syncope.  He reports occasional dizziness when he stands from a seated position.  Past Medical History:  Diagnosis Date  . (HFpEF) heart failure with preserved ejection fraction ([place])    a. 08/2017 Echo: EF 60-65%, Gr1 DD.  [name] Arthritis   . Chest pain    a. 08/2017 Lexiscan MV: EF 53%, no ischemia-->Low risk.  [name] COPD (chronic obstructive pulmonary disease) ([place])   . Depression   . Diabetes mellitus without complication ([place])   . Dyspnea   . Heart murmur   . History of orthopnea   . Hypertension   . Incidental pulmonary nodule, greater than or equal to 93mm 03/11/2020   Left lower lobe - suspicious for bronchogenic neoplasm  . Lymphedema   . Moderate aortic stenosis    a. 08/2017 Echo: EF 60-65%, no rwma, GR1 DD, Mod Ao Stenosis, mean grad 45mHg. Valve area (VTI): 1.04 cm^2. Mildly dil LA.  . Morbid obesity ([place])   . Prostate cancer ([place]) 03/2013   ? PANCREATIC  . Severe aortic stenosis   . Sleep apnea   . Sleep apnea with use of continuous positive airway pressure (CPAP)   . Tobacco abuse   . Tremor    HEAD  . Wheezing    OCCAS    Past Surgical History:  Procedure Laterality Date  . ADENOIDECTOMY    . APPENDECTOMY    . CATARACT EXTRACTION W/PHACO Left 11/28/2018   Procedure: CATARACT EXTRACTION PHACO AND INTRAOCULAR LENS PLACEMENT (IOC) LEFT, DIABETIC;  Surgeon: Birder Robson, MD;  Location: [facility] ORS;  Service: Ophthalmology;  Laterality: Left;  [name]  00:56 CDE 9.44 Fluid pack lot # [id number]  . EUS N/A 10/13/2017   Procedure: FULL UPPER ENDOSCOPIC ULTRASOUND (EUS) RADIAL;  Surgeon: Holly Bodily, MD;  Location: [facility] ENDOSCOPY;  Service: Gastroenterology;  Laterality: N/A;  . PANCREATICODUODENECTOMY  07/23/2018  . RIGHT/LEFT HEART CATH AND CORONARY ANGIOGRAPHY N/A  02/21/2020   Procedure: RIGHT/LEFT HEART CATH AND CORONARY ANGIOGRAPHY;  Surgeon: Minna Merritts, MD;  Location: [place] CV LAB;  Service: Cardiovascular;  Laterality: N/A;  . TONSILLECTOMY      Family History  Problem Relation Age of Onset  . Breast cancer Mother   . Cancer Father        Black Lung  . Cerebral palsy Brother   . Tuberculosis Paternal Uncle   . Prostate cancer Neg Hx   . Kidney cancer Neg Hx   . Bladder Cancer Neg Hx     Social History   Socioeconomic History  . Marital status: Widowed    Spouse name: Not on file  . Number of children: 2  . Years of education: Not on file  . Highest education level: Not on file  Occupational History  . Occupation: retired Social research officer, government  Tobacco Use  . Smoking status: Current Some Day Smoker    Packs/day: 0.25    Years: 68.00    Pack years: 17.00    Types: Cigarettes  . Smokeless tobacco: Former Systems developer    Types: Chew, [place] date: 2000  . Tobacco comment: 5 cigs daily 01/29/20/ER  Vaping Use  . Vaping Use: Never used  Substance and Sexual Activity  . Alcohol use: Not Currently    Alcohol/week: 0.0 - 1.0 standard drinks  Comment: quit 40years ago  . Drug use: No  . Sexual activity: Not Currently  Other Topics Concern  . Not on file  Social History Narrative   Living at [place] Strain:   . Difficulty of Paying Living Expenses:   Food Insecurity:   . Worried About Charity fundraiser in the Last Year:   . Arboriculturist in the Last Year:   Transportation Needs:   . Film/video editor (Medical):   [name] Lack of Transportation (Non-Medical):   Physical Activity:   . Days of Exercise per Week:   . Minutes of Exercise per Session:   Stress:   . Feeling of Stress :   Social Connections:   . Frequency of Communication with Friends and Family:   . Frequency of Social Gatherings with Friends and Family:   . Attends Religious  Services:   . Active Member of Clubs or Organizations:   . Attends Archivist Meetings:   [name] Marital Status:   Intimate Partner Violence:   . Fear of Current or Ex-Partner:   . Emotionally Abused:   [name] Physically Abused:   . Sexually Abused:     Current Outpatient Medications  Medication Sig Dispense Refill  . albuterol (PROVENTIL) (2.5 MG/3ML) 0.083% nebulizer solution Take 2.5 mg by nebulization every 6 (six) hours as needed for wheezing or shortness of breath.    [name] albuterol (VENTOLIN HFA) 108 (90 Base) MCG/ACT inhaler Inhale 2 puffs into the lungs every 6 (six) hours as needed for wheezing or shortness of breath. 6.7 g 2  . aspirin EC 81 MG tablet Take 81 mg by mouth daily.    . carvedilol (COREG) 3.125 MG tablet Take 1 tablet (3.125 mg total) by mouth 2 (two) times daily. 60 tablet 5  . clopidogrel (PLAVIX) 75 MG tablet TAKE 1 TABLET BY MOUTH ONCE DAILY 90 tablet 0  . diclofenac (VOLTAREN) 75 MG EC tablet Take 75 mg by mouth 2 (two) times daily as needed for mild pain or moderate pain.     [name] docusate sodium (COLACE) 100 MG capsule Take 100 mg by mouth daily as needed for mild constipation.    . gabapentin (NEURONTIN) 100 MG capsule Take 100-200 mg by mouth at bedtime.     . hydrochlorothiazide (HYDRODIURIL) 25 MG tablet Take 1 tablet (25 mg total) by mouth daily. 30 tablet 5  . insulin glargine (LANTUS) 100 unit/mL SOPN Inject 20 Units into the skin at bedtime.    . metFORMIN (GLUCOPHAGE) 500 MG tablet Take 1,000 mg by mouth 2 (two) times daily.     . pantoprazole (PROTONIX) 40 MG tablet Take 1 tablet (40 mg total) by mouth 2 (two) times daily. 60 tablet 11  . SPIRIVA HANDIHALER 18 MCG inhalation capsule INHALE CONTENTS OF 1 CAPSULE ONCE DAILY 30 capsule 5  . SYMBICORT 80-4.5 MCG/ACT inhaler INHALE 2 PUFFS BY MOUTH TWICE DAILY (Patient taking differently: Inhale 2 puffs into the lungs 2 (two) times daily. ) 10.2 g 3  . traZODone (DESYREL) 100 MG tablet Take 100 mg by mouth at  bedtime as needed for sleep. (Patient not taking: Reported on 03/21/2020)     No current facility-administered medications for this visit.    Allergies  Allergen Reactions  . Codeine Itching      Review of Systems:   General:  normal appetite, decreased energy, no weight gain, + weight loss, no fever  Cardiac:  no chest pain with exertion, no chest pain at rest, +SOB with exertion, no resting SOB, no PND, no orthopnea, no palpitations, no arrhythmia, no atrial fibrillation, no LE edema, + dizzy spells, no syncope  Respiratory:  + exertional shortness of breath, no home oxygen, no productive cough, no dry cough, no bronchitis, no wheezing, no hemoptysis, no asthma, no pain with inspiration or cough, + sleep apnea, + CPAP at night  GI:   no difficulty swallowing, no reflux, no frequent heartburn, no hiatal hernia, no current abdominal pain, no constipation, no diarrhea, no hematochezia, no hematemesis, no melena  GU:   no dysuria,  + frequency, no urinary tract infection, no hematuria, no enlarged prostate, no kidney stones, no kidney disease  Vascular:  no pain suggestive of claudication, no pain in feet, + leg cramps, no varicose veins, no DVT, no non-healing foot ulcer  Neuro:   no stroke, no TIA's, no seizures, no headaches, no temporary blindness one eye,  no slurred speech, no peripheral neuropathy, no chronic pain, + instability of gait, no memory/cognitive dysfunction  Musculoskeletal: + arthritis particularly right hip, no joint swelling, no myalgias, + difficulty walking, decreased mobility   Skin:   no rash, no itching, no skin infections, no pressure sores or ulcerations  Psych:   no anxiety, no depression, no nervousness, no unusual recent stress  Eyes:   no blurry vision, no floaters, no recent vision changes, + wears glasses or contacts  ENT:   no hearing loss, no loose or painful teeth, edentulous but no dentures  Hematologic:  no easy bruising, no abnormal bleeding, no  clotting disorder, no frequent epistaxis  Endocrine:  + diabetes, does check CBG's at home           Physical Exam:   BP 132/73   Pulse 79   Temp 97.6 F (36.4 C) (Skin)   Resp 20   Ht 5\' 9"  (1.753 m)   Wt 213 lb (96.6 kg)   SpO2 94% Comment: RA  BMI 31.45 kg/m   General:  Obese male NAP    HEENT:  Unremarkable   Neck:   no JVD, no bruits, no adenopathy   Chest:   clear to auscultation, symmetrical breath sounds, no wheezes, no rhonchi   CV:   RRR, grade III/VI crescendo/decrescendo murmur heard best at LLSB,  no diastolic murmur  Abdomen:  soft, non-tender, no masses   Extremities:  warm, well-perfused, pulses diminished, no LE edema  Rectal/GU  Deferred  Neuro:   Grossly non-focal and symmetrical throughout  Skin:   Clean and dry, no rashes, no breakdown   Diagnostic Tests:  EKG: NSR w/out significant AV conduction delay (02/19/2020)   ECHOCARDIOGRAM REPORT       Patient Name:  Nathaniel Little Date of Exam: 02/07/2020  Medical Rec #: [id number]  Height:    69.0 in  Accession #:  [id number]  Weight:    221.0 lb  Date of Birth: September 15, 1946  BSA:     2.156 m  Patient Age:  37 years   BP:      122/64 mmHg  Patient Gender: M      HR:      78 bpm.  Exam Location: [place]   Procedure: 2D Echo, Cardiac Doppler and Color Doppler   Indications:  I35.8 Other nonrheumatic aortic valve disorders,; R01.1  Murmur    History:    Patient has prior history of Echocardiogram examinations,  most  recent 07/17/2018. COPD, Aortic Valve Disease,         Signs/Symptoms:Murmur; Risk Factors:Sleep Apnea,  Hypertension,         Diabetes, Dyslipidemia and Current Smoker. Patient states  he has         no SOB but has lung problems due to COPD. He uses a  motorized         wheelchair to ambulate.    Sonographer:  Salvadore Dom RVT, RDCS (AE), RDMS  Referring Phys: [id number] CARMEN L  GONZALEZ   IMPRESSIONS    1. Left ventricular ejection fraction, by estimation, is 60 to 65%. The  left ventricle has normal function. The left ventricle has no regional  wall motion abnormalities. There is mild left ventricular hypertrophy.  Left ventricular diastolic parameters  are consistent with Grade I diastolic dysfunction (impaired relaxation).  2. Right ventricular systolic function is normal. The right ventricular  size is normal.  3. Left atrial size was mildly dilated.  4. The mitral valve is normal in structure. No evidence of mitral valve  regurgitation.  5. Aortic valve DVI = 0.23, Peak gradient 22mmHg, Mean Gradient 37, peak  velocity 4.1m/s. Unable to determine valve morphology due to image  quality. Aortic valve regurgitation is not visualized. Severe aortic valve  stenosis. Aortic valve area, by VTI  measures 0.85 cm.  6. Aortic root is upper limit of normal in size.   Comparison(s):   FINDINGS  Left Ventricle: Left ventricular ejection fraction, by estimation, is 60  to 65%. The left ventricle has normal function. The left ventricle has no  regional wall motion abnormalities. The left ventricular internal cavity  size was normal in size. There is  mild left ventricular hypertrophy. Left ventricular diastolic parameters  are consistent with Grade I diastolic dysfunction (impaired relaxation).   Right Ventricle: The right ventricular size is normal. No increase in  right ventricular wall thickness. Right ventricular systolic function is  normal.   Left Atrium: Left atrial size was mildly dilated.   Right Atrium: Right atrial size was normal in size.   Pericardium: There is no evidence of pericardial effusion.   Mitral Valve: The mitral valve is normal in structure. No evidence of  mitral valve regurgitation.   Tricuspid Valve: The tricuspid valve is grossly normal. Tricuspid valve  regurgitation is not demonstrated.   Aortic Valve: Aortic  valve DVI = 0.23, Peak gradient 53mmHg, Mean Gradient  37, peak velocity 4.30m/s. Unable to determine valve morphology due to  image quality. Aortic valve regurgitation is not visualized. Severe aortic  stenosis is present. Aortic valve  mean gradient measures 34.5 mmHg. Aortic valve peak gradient measures  66.6 mmHg. Aortic valve area, by VTI measures 0.85 cm.   Pulmonic Valve: The pulmonic valve was not well visualized. Pulmonic valve  regurgitation is not visualized.   Aorta: Root is upper limit of normal in size.   Venous: The inferior vena cava was not well visualized.   IAS/Shunts: No atrial level shunt detected by color flow Doppler.     LEFT VENTRICLE  PLAX 2D  LVIDd:     4.50 cm   Diastology  LVIDs:     2.40 cm   LV e' lateral:  7.94 cm/s  LV PW:     1.70 cm   LV E/e' lateral: 9.2  LV IVS:    1.40 cm   LV e' medial:  5.55 cm/s  LVOT diam:   2.10 cm   LV E/e' medial: 13.1  LV SV:     75  LV SV Index:  35  LVOT Area:   3.46 cm    LV Volumes (MOD)  LV vol d, MOD A2C: 75.1 ml  LV vol d, MOD A4C: 121.0 ml  LV vol s, MOD A2C: 16.2 ml  LV vol s, MOD A4C: 29.4 ml  LV SV MOD A2C:   58.9 ml  LV SV MOD A4C:   121.0 ml  LV SV MOD BP:   79.0 ml   RIGHT VENTRICLE  RV S prime:   16.80 cm/s  TAPSE (M-mode): 2.9 cm   LEFT ATRIUM       Index    RIGHT ATRIUM      Index  LA diam:    4.20 cm 1.95 cm/m RA Area:   19.20 cm  LA Vol (A2C):  77.7 ml 36.05 ml/m RA Volume:  56.00 ml 25.98 ml/m  LA Vol (A4C):  90.9 ml 42.17 ml/m  LA Biplane Vol: 88.3 ml 40.96 ml/m  AORTIC VALVE          PULMONIC VALVE  AV Area (Vmax):  0.82 cm   PV Vmax:    1.00 m/s  AV Area (Vmean):  0.85 cm   PV Peak grad: 4.0 mmHg  AV Area (VTI):   0.85 cm  AV Vmax:      408.00 cm/s  AV Vmean:     274.500 cm/s  AV VTI:      0.886 m  AV Peak Grad:   66.6 mmHg  AV Mean Grad:    34.5 mmHg  LVOT Vmax:     96.70 cm/s  LVOT Vmean:    67.700 cm/s  LVOT VTI:     0.217 m  LVOT/AV VTI ratio: 0.25    AORTA  Ao Root diam: 3.60 cm  Ao Asc diam: 3.70 cm   MITRAL VALVE        TRICUSPID VALVE  MV Area (PHT): 2.54 cm  TR Peak grad:  6.0 mmHg  MV Decel Time: 299 msec  TR Vmax:    122.00 cm/s  MV E velocity: 72.70 cm/s  MV A velocity: 99.50 cm/s SHUNTS  MV E/A ratio: 0.73    Systemic VTI: 0.22 m               Systemic Diam: 2.10 cm   Kate Sable MD  Electronically signed by Kate Sable MD  Signature Date/Time: 02/07/2020/6:21:18 PM      RIGHT/LEFT HEART CATH AND CORONARY ANGIOGRAPHY  Conclusion   The left ventricular ejection fraction is 55-65% by visual estimate.  LV end diastolic pressure is normal.  The left ventricular systolic function is normal.  There is severe aortic valve stenosis.   Procedural Details  Technical Details Cardiac Catheterization Procedure Note  Name: Nathaniel Little MRN: [id number] DOB: [birth year]  Procedure: Left Heart Cath, Selective Coronary Angiography, LV angiography, right heart catheterization  Indication:   Nathaniel Little a 75 y.o.male Smoker, COPD Prior significant alcohol intake.  OSA, on CPAP HTN Murmur Prostate cancer 07/25/2017 emergency department via EMS with 1 day of progressive worsening shortness of breath.   increasing productive cough and shortness of breath  Constipated CT ABD: Pancreatic findings :chronic pancreatitis and calcifications,  Spoke with presents for preoperative evaluation cardiac catheterization for chest tightness shortness of breath severe aortic valve stenosis preop TAVR   Procedural details: The right groin was prepped, draped, and anesthetized with 1% lidocaine. Using modified Seldinger technique, a 5 French sheath was introduced into  the right femoral artery.  7 French sheath introduced into right femoral vein for  right heart catheter pressures,  standard Judkins catheters (JL 4, JR 4 and pigtail catheter, Swan-Ganz catheter) were used for coronary angiography and left ventriculography. Catheter exchanges were performed over a guidewire. There were no immediate procedural complications. The patient was transferred to the post catheterization recovery area for further monitoring.  Moderate sedation: 1. Sedation used: 1 mg Versed 50 mcg fentanyl 2. Time of administration:       10:45 AM     Time patient left for recovery 11:20 AM Sedation time 35 minutes 3. I was Face to Face with the patient during this time: (code: [id number])   Procedural Findings:     Coronary angiography:  Coronary dominance: Right  Left mainstem:   Large vessel that bifurcates into the LAD and left circumflex, no significant disease noted  Left anterior descending (LAD):   Large vessel that extends to the apical region, diagonal branch 2 of moderate size, no significant disease noted  Left circumflex (LCx):  Large vessel with OM branch 2, no significant disease noted  Right coronary artery (RCA):  Right dominant vessel with PL and PDA, no significant disease noted  Left ventriculography: Left ventricular systolic function is normal, LVEF is estimated at 55-65%, there is no significant mitral regurgitation ,  Severe aortic valve stenosis Mean gradient estimated 44.88 mmHg Valve area estimated 0.95 cm  Right heart pressures RA 3 RV 26/0/3 PA 29/10/18 Wedge 7 LV 199/-2/7 Ao 142/62/90 Cardiac output 6.24 cardiac index 2.91  Final Conclusions:   No significant coronary artery disease Normal right heart pressures Severe aortic valve stenosis Mean gradient 44 mmHg estimated valve area 0.9 cm  Recommendations:  He has been referred to TAVR clinic, has appointment Monday 2:00 with Dr. Angelena Form Feb 25, 2020  Ida Rogue 02/21/2020, 11:28 AM  Estimated blood loss <50 mL.   During this procedure medications were  administered to achieve and maintain moderate conscious sedation while the patient's heart rate, blood pressure, and oxygen saturation were continuously monitored and I was present face-to-face 100% of this time.  Medications (Filter: Administrations occurring from 1000 to 1120 on 02/21/20) (important) Continuous medications are totaled by the amount administered until 02/21/20 1120.  midazolam (VERSED) injection (mg) Total dose:  1 mg  Date/Time  Rate/Dose/Volume Action  02/21/20 1025  1 mg Given    fentaNYL (SUBLIMAZE) injection (mcg) Total dose:  50 mcg  Date/Time  Rate/Dose/Volume Action  02/21/20 1025  50 mcg Given    iohexol (OMNIPAQUE) 300 MG/ML solution (mL) Total volume:  100 mL  Date/Time  Rate/Dose/Volume Action  02/21/20 1106  100 mL Given    aspirin EC tablet 81 mg (mg) Total dose:  0 mg Dosing weight:  98.4  Date/Time  Rate/Dose/Volume Action  02/21/20   Canceled Entry    atorvastatin (LIPITOR) tablet 80 mg (mg) Total dose:  Cannot be calculated* Dosing weight:  98.4  *Administration dose not documented Date/Time  Rate/Dose/Volume Action  02/21/20 1000  *Not included in total Automatically Held    carvedilol (COREG) tablet 3.125 mg (mg) Total dose:  Cannot be calculated* Dosing weight:  98.4  *Administration dose not documented Date/Time  Rate/Dose/Volume Action  02/21/20 1000  *Not included in total Automatically Held    clopidogrel (PLAVIX) tablet 75 mg (mg) Total dose:  Cannot be calculated*  *Administration dose not documented Date/Time  Rate/Dose/Volume Action  02/21/20 1000  *Not included in total Automatically Held  fluticasone furoate-vilanterol (BREO ELLIPTA) 100-25 MCG/INH 1 puff (puff) Total dose:  Cannot be calculated* Dosing weight:  98.4  *Administration dose not documented Date/Time  Rate/Dose/Volume Action  02/21/20 1000  *Not included in total Automatically Held    pantoprazole (PROTONIX) EC tablet 40 mg (mg) Total dose:   Cannot be calculated*  *Administration dose not documented Date/Time  Rate/Dose/Volume Action  02/21/20 1000  *Not included in total Automatically Held    simethicone (MYLICON) chewable tablet 80 mg (mg) Total dose:  Cannot be calculated* Dosing weight:  99.7  *Administration dose not documented Date/Time  Rate/Dose/Volume Action  02/21/20 1000  *Not included in total Automatically Held    tiotropium (SPIRIVA) inhalation capsule ([facility] use ONLY) 18 mcg (mcg) Total dose:  Cannot be calculated* Dosing weight:  98.4  *Administration dose not documented Date/Time  Rate/Dose/Volume Action  02/21/20 1000  *Not included in total Automatically Held    Sedation Time  Sedation Time Physician-1: 40 minutes 35 seconds  Contrast  Medication Name Total Dose  iohexol (OMNIPAQUE) 300 MG/ML solution 100 mL    Radiation/Fluoro  Fluoro time: 7 (min) DAP: 35.1 (Gycm2) Cumulative Air Kerma: 436 (mGy)  Coronary Findings  Diagnostic Dominance: Right No diagnostic findings have been documented. Intervention  No interventions have been documented. Wall Motion  Resting               Left Heart  Left Ventricle The left ventricular size is normal. The left ventricular systolic function is normal. LV end diastolic pressure is normal. The left ventricular ejection fraction is 55-65% by visual estimate. No regional wall motion abnormalities.  Aortic Valve There is severe aortic valve stenosis. The aortic valve is calcified. There is restricted aortic valve motion.  Coronary Diagrams  Diagnostic Dominance: Right  Intervention  Implants   Vascular Products  Device Closure Mynxgrip 6/55f - [id number] - Implanted  Inventory item: DEVICE CLOSURE MYNXGRIP 6/68F Model/Cat number: [id number]  Manufacturer: [employer] Lot number: [id number]  Device identifier: [id number] Device identifier type: GS1  Area Of Implantation: Groin    GUDID Information  Request status Successful    Brand  name: [facility] Version/Model: [id number]  Company name: [place]. MRI safety info as of 02/21/20: MR Safe  Contains dry or latex rubber: No    GMDN P.T. name: Wound hydrogel dressing, non-antimicrobial    As of 02/21/2020  Status: Implanted      Syngo Images  Show images for CARDIAC CATHETERIZATION Images on Long Term Storage  Show images for [place], [place] to Procedure Log  Procedure Log    Hemo Data (last 4 days) before discharge   AO Systolic Cath Pressure  AO Diastolic Cath Pressure  AO Mean Cath Pressure  LV Systolic Cath Pressure  LV End Diastolic  LV Systolic  LV End Diastolic  LV dP/dt  PA Systolic Cath Pressure  PA Diastolic Cath Pressure  PA Mean Cath Pressure  RA Wedge A Wave  RA Wedge V Wave  RV Systolic Cath Pressure  RV Diastolic Cath Pressure  RV End Diastolic  RV Systolic  RV End Diastolic  RV dP/dt  PCW A Wave  PCW V Wave  PCW Mean  AO O2 Sat  PA O2 Sat  AO O2 Sat  Fick C.O.  Fick C.I.   --  --  --  --  --  199 mmHg  7 mmHg  1632 mmHg/sec  --  --  --  6 mmHg  3 mmHg  --  --  --  26 mmHg  3 mmHg  528 mmHg/sec  12 mmHg  10 mmHg  7 mmHg  --  --  --  6.24 L/min  2.91 L/min/m2   --  --  --  --  --  --  --  --  29 mmHg  10 mmHg  18 mmHg  --  --  --  --  --  --  --  --  --  --  --  --  --  --  --  --   --  --  --  --  --  --  --  --  --  --  --  --  --  26 mmHg  0 mmHg  3 mmHg  --  --  --  --  --  --  --  --  --  --  --   144  64 mmHg  93 mmHg  --  --  --  --  --  --  --  --  --  --  --  --  --  --  --  --  --  --  --  --  --  --  --  --   --  --  --  --  --  --  --  --  --  --  --  --  --  --  --  --  --  --  --  --  --  --  92 %  --  SA  --  --   --  --  --  --  --  --  --  --  --  --  --  --  --  --  --  --  --  --  --  --  --  --  --  PA  --  --  --   --  --  --  207 mmHg  17 mmHg  --  --  --  --  --  --  --  --  --  --  --  --  --  --  --  --  --  --  --  --  --  --   --  --  --  206 mmHg  16 mmHg  --  --  --  --  --  --  --  --  --  --  --  --  --  --  --  --  --   --  --  --  --  --   --  --  --  199 mmHg  7 mmHg  --  --  --  --  --  --  --  --  --  --  --  --  --  --  --  --  --  --  --  --  --  --   142  61 mmHg  90 mmHg                                                     Cardiac TAVR CT  TECHNIQUE: The patient was scanned on a Siemens Force 833 slice scanner. A 120 kV retrospective scan was triggered in the ascending thoracic aorta at 140 HU's. Gantry rotation speed was 250 msecs and collimation was .6 mm. No beta blockade or nitro were given. The 3D data set was  reconstructed in 5% intervals of the R-R cycle. Systolic and diastolic phases were analyzed on a dedicated work station using MPR, MIP and VRT modes. The patient received 80 cc of contrast.  FINDINGS: Aortic Valve: Tri- leaflet calcified with restricted leaflet motion Calcium score for valve 4503 Very bulky calcification of the right coronary leaflet  Aorta: No aneurysm normal arch vessels moderate calcific atherosclerosis  Sino-tubular Junction: 30 mm  Ascending Thoracic Aorta: 35 mm  Aortic Arch: 32 mm  Descending Thoracic Aorta: 27 mm  Sinus of Valsalva Measurements:  Non-coronary: 35.5 mm  Right - coronary: 34 mm  Left -   coronary: 36.3 mm  Coronary Artery Height above Annulus:  Left Main: 13.8 mm above annulus  Right Coronary: 18 mm above annulus  Virtual Basal Annulus Measurements:  Maximum / Minimum Diameter: 31 mm x 26.5 mm  Perimeter: 90 mm  Area: 655 mm 2  Coronary Arteries: Sufficient height above annulus for deployment  Optimum Fluoroscopic Angle for Delivery: LAO 5 Caudal 8  IMPRESSION: 1. Tri leaflet AV with calcium score 4503 annular area of 655 mm2 suitable for a 29 mm Sapien 3 valve Heavy calcification fo the right coronary cusp  2.  Optimum angiographic angle for deployment LAO 5 Caudal 8 degrees  3.  Normal aortic root 3.5 cm  4.  Coronary arteries sufficient height above annulus for deployment  5.  Study not optimized to R/O LAA thrombus cannot r/o filling defect but patient in NSR  [employer]  Electronically Signed: By: Jenkins Rouge M.D. On: 03/11/2020 12:27    CT ANGIOGRAPHY CHEST, ABDOMEN AND PELVIS  TECHNIQUE: Non-contrast CT of the chest was initially obtained.  Multidetector CT imaging through the chest, abdomen and pelvis was performed using the standard protocol during bolus administration of intravenous contrast. Multiplanar reconstructed images and MIPs were obtained and reviewed to evaluate the vascular anatomy.  CONTRAST:  182mL OMNIPAQUE IOHEXOL 350 MG/ML SOLN  COMPARISON:  CT of the abdomen and pelvis 02/18/2020.  FINDINGS: CTA CHEST FINDINGS  Cardiovascular: Heart size is normal. There is no significant pericardial fluid, thickening or pericardial calcification. There is aortic atherosclerosis, as well as atherosclerosis of the great vessels of the mediastinum and the coronary arteries, including calcified atherosclerotic plaque in the left main, left anterior descending and right coronary arteries. Severe thickening calcification of the aortic valve.  Mediastinum/Lymph Nodes: No pathologically enlarged mediastinal or hilar lymph nodes. Esophagus is unremarkable in appearance. No axillary lymphadenopathy.  Lungs/Pleura: In the medial aspect of the superior segment of the left lower lobe (axial image 44 of series 5) there is a macrolobulated 1.3 x 1.0 cm pulmonary nodule. No other suspicious appearing pulmonary nodules or masses are noted. No acute consolidative airspace disease. No pleural effusions.  Musculoskeletal/Soft Tissues: There are no aggressive appearing lytic or blastic lesions noted in the visualized portions of the skeleton.  CTA ABDOMEN AND PELVIS FINDINGS  Hepatobiliary: Diffuse low attenuation throughout the hepatic parenchyma, indicative of hepatic steatosis. No discrete cystic or solid hepatic lesions. No  intra or extrahepatic biliary ductal dilatation. Stent extending from the common bile duct (coiled upon itself) into the second portion of the duodenum. Status post cholecystectomy.  Pancreas: Status post Whipple procedure. No pancreatic mass in the residual body and tail of the pancreas. Large calcification in the distal body of the pancreas measuring 1.2 cm, presumably sequela of prior pancreatitis. No peripancreatic fluid collections or inflammatory changes.  Spleen: Unremarkable.  Adrenals/Urinary Tract: 1.1 cm low-attenuation lesion in the  lower pole of the right kidney, compatible with a simple cyst. Other subcentimeter low-attenuation lesions in both kidneys, too small to characterize, but statistically likely to represent small cysts. Exophytic partially calcified centrally low-attenuation 1 cm lesion in the upper pole of the left kidney, compatible with a mildly complicated (Bosniak class 2) cyst. No definite suspicious renal lesions. No hydroureteronephrosis. Urinary bladder is normal in appearance. Bilateral adrenal glands are normal in appearance.  Stomach/Bowel: Postoperative changes of Whipple procedure. Remaining portions of the stomach are otherwise unremarkable. No pathologic dilatation of small bowel or colon. The appendix is not confidently identified and may be surgically absent. Regardless, there are no inflammatory changes noted adjacent to the cecum to suggest the presence of an acute appendicitis at this time.  Vascular/Lymphatic: Aortic atherosclerosis, without evidence of aneurysm or dissection in the abdominal or pelvic vasculature. Vascular findings and measurements pertinent to potential TAVR procedure, as detailed below. No lymphadenopathy noted in the abdomen or pelvis.  Reproductive: Fiducial markers noted in the prostate gland. Seminal vesicles are unremarkable.  Other: No significant volume of ascites.  No  pneumoperitoneum.  Musculoskeletal: Small well-defined sclerotic lesion in the anterior aspect of the L2 vertebral body, stable compared to prior study from 2018, considered likely benign (presumably a small bone island). There are no other aggressive appearing lytic or blastic lesions noted in the visualized portions of the skeleton.  VASCULAR MEASUREMENTS PERTINENT TO TAVR:  AORTA:  Minimal Aortic Diameter-18 x 17 mm  Severity of Aortic Calcification-moderate to severe  RIGHT PELVIS:  Right Common Iliac Artery -  Minimal Diameter-11.0 x 10.8 mm  Tortuosity-mild  Calcification-moderate  Right External Iliac Artery -  Minimal Diameter-7.2 x 5.1 mm  Tortuosity-moderate  Calcification-mild-to-moderate  Right Common Femoral Artery -  Minimal Diameter-7.5 x 5.9 mm  Tortuosity-mild  Calcification-mild-to-moderate  LEFT PELVIS:  Left Common Iliac Artery -  Minimal Diameter-10.8 x 8.8 mm  Tortuosity-mild  Calcification-moderate  Left External Iliac Artery -  Minimal Diameter-8.0 x 6.2 mm  Tortuosity-moderate  Calcification-mild to moderate  Left Common Femoral Artery -  Minimal Diameter-7.2 x 8.3 mm  Tortuosity-mild  Calcification-mild  Review of the MIP images confirms the above findings.  IMPRESSION: 1. Vascular findings and measurements pertinent to potential TAVR procedure, as detailed above. 2. Severe thickening calcification of the aortic valve, compatible with reported clinical history of severe aortic stenosis. 3. In the medial aspect of the superior segment of the left lower lobe there is a 1.3 x 1.0 cm macrolobulated pulmonary nodule, concerning for primary bronchogenic neoplasm. Further evaluation with PET-CT should be considered in the near future to better evaluate this finding. 4. Hepatic steatosis. 5. Additional incidental findings, as above.  These results will be called to the ordering  clinician or representative by the Radiologist Assistant, and communication documented in the PACS or [employer].   Electronically Signed   By: Vinnie Langton M.D.   On: 03/11/2020 14:21     NUCLEAR MEDICINE PET SKULL BASE TO THIGH  TECHNIQUE: 12.3 mCi F-18 FDG was injected intravenously. Full-ring PET imaging was performed from the skull base to thigh after the radiotracer. CT data was obtained and used for attenuation correction and anatomic localization.  Fasting blood glucose: 143 mg/dl  COMPARISON:  CTA chest abdomen pelvis dated 03/11/2020  FINDINGS: Mediastinal blood pool activity: SUV max 2.3  Liver activity: SUV max NA  NECK: No hypermetabolic cervical lymphadenopathy.  15 mm lesion in the left posterior parotid gland (series 3/image 39), max SUV 5.1,  raising concern for primary parotid neoplasm, benign or malignant. Benign lesion such as Warthin's tumor is common, but this is technically indeterminate.  Incidental CT findings: none  CHEST: 14 x 9 mm lobulated nodule in the medial left lower lobe (series 3/image 108), demonstrating only mild hypermetabolism, max SUV 2.0. However, this raises concern for primary bronchogenic neoplasm.  No hypermetabolic thoracic lymphadenopathy.  Incidental CT findings: Atherosclerotic calcifications of the aortic arch. Mild three-vessel coronary atherosclerosis.  ABDOMEN/PELVIS: No abnormal hypermetabolism in the liver, spleen, pancreas, or adrenal glands.  No hypermetabolic abdominopelvic lymphadenopathy.  Postsurgical changes related to Whipple procedure.  Biliary stent.  Mild rectal wall thickening (series 3/image 255), equivocal. Mild rectal hypermetabolism, max SUV 7.3, favored to be physiologic.  Brachytherapy seeds in the prostate.  Incidental CT findings: Atherosclerotic calcifications of the aortic arch. Moderate fat containing left inguinal hernia.  SKELETON: No focal  hypermetabolic activity to suggest skeletal metastasis.  Incidental CT findings: Degenerative changes of the visualized thoracolumbar spine.  IMPRESSION: 14 mm left lower lobe nodule demonstrates mild hypermetabolism, raising concern for primary bronchogenic neoplasm.  No findings suspicious for metastatic disease.  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  Additional postsurgical and ancillary findings as above.   Electronically Signed   By: Julian Hy M.D.   On: 03/17/2020 13:49     Impression:  Patient has stage D1 severe symptomatic aortic stenosis.  He has a longstanding history of symptoms of exertional shortness of breath consistent with chronic diastolic congestive heart failure, New [place] Heart Association functional class II.  The patient is not very active physically and I suspect that he might have more advanced symptoms if he tried to exert himself more on a regular basis.  The patient also has longstanding history of tobacco abuse, COPD, and obstructive sleep apnea which unquestionably contribute to his symptoms.  I have personally reviewed the patient's recent transthoracic echocardiogram, diagnostic cardiac catheterization, CT angiograms, and PET CT scan.  Echocardiogram reveals severe aortic stenosis with preserved left ventricular systolic function.  The patient has a trileaflet aortic valve with severe calcification involving all 3 leaflets and somewhat asymmetrical bulky calcification in the right coronary leaflet with severely restricted leaflet mobility.  Peak velocity across aortic valve measured greater than 4.1 m/s corresponding to mean transvalvular gradient estimated 37 mmHg.  The DVI is notably low at 0.23 and aortic valve area calculated only 0.85 cm.  Left ventricular systolic function remains preserved.  Diagnostic cardiac catheterization  confirmed the presence of severe aortic stenosis and was notable for mild nonobstructive coronary artery disease.  Right heart pressures were very minimally elevated.  I agree the patient would benefit from aortic valve replacement.  I would be somewhat reluctant to consider this patient candidate for conventional surgery because of his age, numerous comorbid medical problems and somewhat limited physical mobility.  CT angiography reveals anatomical findings consistent with severe aortic stenosis that appears suitable for treatment using transcatheter aortic valve replacement without any significant complicating features, and the patient appears to have adequate pelvic vascular access for transfemoral approach.  CT angiography of the chest also reveals a small (1.3 x 1.0 cm) pulmonary nodule in the left lower lobe that is mildly hypermetabolic on PET CT imaging, suspicious for slow-growing primary bronchogenic neoplasm.  Possibility of metastatic disease from the patient's previous pancreatic tumor seems less likely although not impossible given histology of his pancreatic lesion.  Because the patient lives  alone and reports that he does not have anyone who would likely be able to stay with him for a period of time following hospital discharge, he may need short-term placement in a skilled nursing facility for his recovery following TAVR.   Plan:  The patient was counseled at length regarding treatment alternatives for management of severe symptomatic aortic stenosis. Alternative approaches such as conventional aortic valve replacement, transcatheter aortic valve replacement, and continued medical therapy without intervention were compared and contrasted at length.  The risks associated with conventional surgical aortic valve replacement were discussed in detail, as were expectations for post-operative convalescence, and why I would be reluctant to consider this patient a candidate for conventional  surgery.  Issues specific to transcatheter aortic valve replacement were discussed including questions about long term valve durability, the potential for paravalvular leak, possible increased risk of need for permanent pacemaker placement, and other technical complications related to the procedure itself.  Long-term prognosis with medical therapy was discussed. This discussion was placed in the context of the patient's own specific clinical presentation and past medical history.  All of his questions have been addressed.  The patient desires to proceed with transcatheter aortic valve replacement in the near future.  We tentatively plan for surgery on April 01, 2020.  The patient has been instructed to hold Plavix therapy for 7 days prior to his procedure.  He has also been advised to quit smoking, and results of PET CT scan will be discussed with plans to refer the patient to the multidisciplinary thoracic oncology clinic following TAVR.  Following the decision to proceed with transcatheter aortic valve replacement, a discussion has been held regarding what types of management strategies would be attempted intraoperatively in the event of life-threatening complications, including whether or not the patient would be considered a candidate for the use of cardiopulmonary bypass and/or conversion to open sternotomy for attempted surgical intervention.  The patient specifically requests that should a potentially life-threatening complication develop we would attempt emergency median sternotomy and/or other aggressive surgical procedures if the patient were to experience complications that were felt to be potentially correctable with surgical intervention.  The patient has been advised of a variety of complications that might develop including but not limited to risks of death, stroke, paravalvular leak, aortic dissection or other major vascular complications, aortic annulus rupture, device embolization, cardiac rupture  or perforation, mitral regurgitation, acute myocardial infarction, arrhythmia, heart block or bradycardia requiring permanent pacemaker placement, congestive heart failure, respiratory failure, renal failure, pneumonia, infection, other late complications related to structural valve deterioration or migration, or other complications that might ultimately cause a temporary or permanent loss of functional independence or other long term morbidity.  The patient provides full informed consent for the procedure as described and all questions were answered.      I spent in excess of 90 minutes during the conduct of this office consultation and >50% of this time involved direct face-to-face encounter with the patient for counseling and/or coordination of their care.     Valentina Gu. Roxy Manns, MD 03/21/2020 1:31 PM

## 2020-03-21 NOTE — Telephone Encounter (Signed)
Discussed results of PET CT scan with the patient over the telephone confirming the presence of mildly increased metabolic activity in the small left lower lobe nodule, suspicious for possible primary bronchogenic cancer.  Possibility of metastasis from the patient's previous pancreatic neoplasm seems much less likely but cannot be definitively ruled out.  We will plan referral to the multidisciplinary thoracic oncology clinic for management following transcatheter aortic valve replacement.  Smoking sensation was encouraged.  All questions answered.  Rexene Alberts MD 03/21/2020 4:02 PM

## 2020-03-21 NOTE — Progress Notes (Signed)
[facility] [place] [facility]GERY CONSULTATION REPORT  Primary Cardiologist is Ida Rogue, MD PCP is [name], [name], FNP  Chief Complaint  Patient presents with   Aortic Stenosis    Surgical eval for TAVR, review all testing/    HPI:  Patient is a 74 year old male with history of aortic stenosis, chronic diastolic congestive heart failure with preserved ejection fraction, longstanding tobacco abuse with COPD, type 2 diabetes mellitus without complications, hypertension, left lower lobe pulmonary nodule suspicious for bronchogenic neoplasm, obstructive sleep apnea on CPAP, obesity, and intraductal papillary mucinous neoplasm of the pancreas status post Whipple procedure in 2019 who has been referred for surgical consultation to discuss treatment options for management of severe symptomatic aortic stenosis.  The patient has been followed by Dr. Rockey Situ intermittently ever since.  Patient was first evaluated by a cardiologist in 2018 for symptoms of exertional shortness of breath.  Stress test was felt to be low risk for coronary ischemia and echocardiogram revealed normal left ventricular systolic function with moderate aortic stenosis.  Symptoms were attributed to heart failure with preserved ejection fraction and COPD.    Patient underwent Whipple procedure in October 2019 at [facility] for intraductal papillary mucinous neoplasm of the pancreas.  The common and proper hepatic arteries were found to be dissected and occluded during surgery and ultimately the patient underwent stent placement in the gastroduodenal artery and hepatic arteries.  He was started on Plavix at that time.  The patient underwent follow-up echocardiogram Feb 07, 2020 revealing significant progression in the severity of aortic stenosis with preserved left ventricular systolic function.  Peak velocity across aortic valve was measured 4.1 m/s  corresponding to mean transvalvular gradient estimated 37 mmHg.  DVI was reported 0.23 and aortic valve area calculated 0.85 cm by VTI.  Left ventricular systolic function remain preserved with ejection fraction estimated 65%.  Stroke-volume index was reported 35.  The patient was referred to the multidisciplinary heart valve clinic for management.  Shortly after that the patient was hospitalized briefly at [facility] with abdominal pain, elevated white blood count, and mild lactic acidosis.  CT scan of the abdomen and pelvis revealed "considerable peripancreatic and duodenal inflammatory change consistent with some acute pancreatitis or duodenitis.  The patient was treated with ciprofloxacin and Flagyl.  Symptoms resolved.  During his hospitalization he was evaluated by cardiology and he underwent diagnostic cardiac catheterization by Dr. Rockey Situ on Feb 21, 2020.  Catheterization revealed nonobstructive coronary artery disease with normal left ventricular systolic function and severe aortic stenosis.  Mean transvalvular gradient across the aortic valve was measured 45 mmHg corresponding to valve area calculated 0.95 cm.  There was very mild elevation of right-sided pressures.  Patient was seen in consultation by Dr. Angelena Form and CT angiography was performed.  CT scan of the chest revealed small pulmonary nodule in the left lower lobe suspicious for bronchogenic neoplasm.  Cardiothoracic surgical consultation was requested.  Patient is a widower and lives alone in [place].  He has 2 adult children 1 of whom lives in [place] and the other lives in [place].  He apparently is not close with either.  Patient has remained functionally independent although he no longer drives a car.  His mobility is somewhat limited.  He ambulates using a cane, walker, or wheelchair.  He arranges for transportation to and from [facility] appointments through his insurance company.  He has a long history of symptoms of exertional  shortness  of breath that have progressed somewhat.  He denies exertional chest pain or chest tightness.  He denies resting shortness of breath, PND, orthopnea, or lower extremity edema.  He has not had syncope.  He reports occasional dizziness when he stands from a seated position.  Past Medical History:  Diagnosis Date   (HFpEF) heart failure with preserved ejection fraction ([place])    a. 08/2017 Echo: EF 60-65%, Gr1 DD.   Arthritis    Chest pain    a. 08/2017 Lexiscan MV: EF 53%, no ischemia-->Low risk.   COPD (chronic obstructive pulmonary disease) (HCC)    Depression    Diabetes mellitus without complication (HCC)    Dyspnea    Heart murmur    History of orthopnea    Hypertension    Incidental pulmonary nodule, greater than or equal to 80mm 03/11/2020   Left lower lobe - suspicious for bronchogenic neoplasm   Lymphedema    Moderate aortic stenosis    a. 08/2017 Echo: EF 60-65%, no rwma, GR1 DD, Mod Ao Stenosis, mean grad 46mHg. Valve area (VTI): 1.04 cm^2. Mildly dil LA.   Morbid obesity ([place])    Prostate cancer ([place]) 03/2013   ? PANCREATIC   Severe aortic stenosis    Sleep apnea    Sleep apnea with use of continuous positive airway pressure (CPAP)    Tobacco abuse    Tremor    HEAD   Wheezing    OCCAS    Past Surgical History:  Procedure Laterality Date   ADENOIDECTOMY     APPENDECTOMY     CATARACT EXTRACTION W/PHACO Left 11/28/2018   Procedure: CATARACT EXTRACTION PHACO AND INTRAOCULAR LENS PLACEMENT (IOC) LEFT, DIABETIC;  Surgeon: Birder Robson, MD;  Location: [facility] ORS;  Service: Ophthalmology;  Laterality: Left;  [name]  00:56 CDE 9.44 Fluid pack lot # [id number]   EUS N/A 10/13/2017   Procedure: FULL UPPER ENDOSCOPIC ULTRASOUND (EUS) RADIAL;  Surgeon: Holly Bodily, MD;  Location: [facility] ENDOSCOPY;  Service: Gastroenterology;  Laterality: N/A;   PANCREATICODUODENECTOMY  07/23/2018   RIGHT/LEFT HEART CATH AND CORONARY ANGIOGRAPHY N/A  02/21/2020   Procedure: RIGHT/LEFT HEART CATH AND CORONARY ANGIOGRAPHY;  Surgeon: Minna Merritts, MD;  Location: [facility];  Service: Cardiovascular;  Laterality: N/A;   TONSILLECTOMY      Family History  Problem Relation Age of Onset   Breast cancer Mother    Cancer Father        Black Lung   Cerebral palsy Brother    Tuberculosis Paternal Uncle    Prostate cancer Neg Hx    Kidney cancer Neg Hx    Bladder Cancer Neg Hx     Social History   Socioeconomic History   Marital status: Widowed    Spouse name: Not on file   Number of children: 2   Years of education: Not on file   Highest education level: Not on file  Occupational History   Occupation: retired -Patent attorney  Tobacco Use   Smoking status: Current Some Day Smoker    Packs/day: 0.25    Years: 68.00    Pack years: 17.00    Types: Cigarettes   Smokeless tobacco: Former Systems developer    Types: Chew, Snuff    Quit date: 2000   Tobacco comment: 5 cigs daily 01/29/20/ER  Vaping Use   Vaping Use: Never used  Substance and Sexual Activity   Alcohol use: Not Currently    Alcohol/week: 0.0 - 1.0 standard drinks  Comment: quit 40years ago   Drug use: No   Sexual activity: Not Currently  Other Topics Concern   Not on file  Social History Narrative   Living at [place] Strain:    Difficulty of Paying Living Expenses:   Food Insecurity:    Worried About Charity fundraiser in the Last Year:    Arboriculturist in the Last Year:   Transportation Needs:    Film/video editor (Medical):    Lack of Transportation (Non-Medical):   Physical Activity:    Days of Exercise per Week:    Minutes of Exercise per Session:   Stress:    Feeling of Stress :   Social Connections:    Frequency of Communication with Friends and Family:    Frequency of Social Gatherings with Friends and Family:    Attends Religious  Services:    Active Member of Clubs or Organizations:    Attends Music therapist:    Marital Status:   Intimate Partner Violence:    Fear of Current or Ex-Partner:    Emotionally Abused:    Physically Abused:    Sexually Abused:     Current Outpatient Medications  Medication Sig Dispense Refill   albuterol (PROVENTIL) (2.5 MG/3ML) 0.083% nebulizer solution Take 2.5 mg by nebulization every 6 (six) hours as needed for wheezing or shortness of breath.     albuterol (VENTOLIN HFA) 108 (90 Base) MCG/ACT inhaler Inhale 2 puffs into the lungs every 6 (six) hours as needed for wheezing or shortness of breath. 6.7 g 2   aspirin EC 81 MG tablet Take 81 mg by mouth daily.     carvedilol (COREG) 3.125 MG tablet Take 1 tablet (3.125 mg total) by mouth 2 (two) times daily. 60 tablet 5   clopidogrel (PLAVIX) 75 MG tablet TAKE 1 TABLET BY MOUTH ONCE DAILY 90 tablet 0   diclofenac (VOLTAREN) 75 MG EC tablet Take 75 mg by mouth 2 (two) times daily as needed for mild pain or moderate pain.      docusate sodium (COLACE) 100 MG capsule Take 100 mg by mouth daily as needed for mild constipation.     gabapentin (NEURONTIN) 100 MG capsule Take 100-200 mg by mouth at bedtime.      hydrochlorothiazide (HYDRODIURIL) 25 MG tablet Take 1 tablet (25 mg total) by mouth daily. 30 tablet 5   insulin glargine (LANTUS) 100 unit/mL SOPN Inject 20 Units into the skin at bedtime.     metFORMIN (GLUCOPHAGE) 500 MG tablet Take 1,000 mg by mouth 2 (two) times daily.      pantoprazole (PROTONIX) 40 MG tablet Take 1 tablet (40 mg total) by mouth 2 (two) times daily. 60 tablet 11   SPIRIVA HANDIHALER 18 MCG inhalation capsule INHALE CONTENTS OF 1 CAPSULE ONCE DAILY 30 capsule 5   SYMBICORT 80-4.5 MCG/ACT inhaler INHALE 2 PUFFS BY MOUTH TWICE DAILY (Patient taking differently: Inhale 2 puffs into the lungs 2 (two) times daily. ) 10.2 g 3   traZODone (DESYREL) 100 MG tablet Take 100 mg by mouth at  bedtime as needed for sleep. (Patient not taking: Reported on 03/21/2020)     No current facility-administered medications for this visit.    Allergies  Allergen Reactions   Codeine Itching      Review of Systems:   General:  normal appetite, decreased energy, no weight gain, + weight loss, no fever  Cardiac:  no chest pain with exertion, no chest pain at rest, +SOB with exertion, no resting SOB, no PND, no orthopnea, no palpitations, no arrhythmia, no atrial fibrillation, no LE edema, + dizzy spells, no syncope  Respiratory:  + exertional shortness of breath, no home oxygen, no productive cough, no dry cough, no bronchitis, no wheezing, no hemoptysis, no asthma, no pain with inspiration or cough, + sleep apnea, + CPAP at night  GI:   no difficulty swallowing, no reflux, no frequent heartburn, no hiatal hernia, no current abdominal pain, no constipation, no diarrhea, no hematochezia, no hematemesis, no melena  GU:   no dysuria,  + frequency, no urinary tract infection, no hematuria, no enlarged prostate, no kidney stones, no kidney disease  Vascular:  no pain suggestive of claudication, no pain in feet, + leg cramps, no varicose veins, no DVT, no non-healing foot ulcer  Neuro:   no stroke, no TIA's, no seizures, no headaches, no temporary blindness one eye,  no slurred speech, no peripheral neuropathy, no chronic pain, + instability of gait, no memory/cognitive dysfunction  Musculoskeletal: + arthritis particularly right hip, no joint swelling, no myalgias, + difficulty walking, decreased mobility   Skin:   no rash, no itching, no skin infections, no pressure sores or ulcerations  Psych:   no anxiety, no depression, no nervousness, no unusual recent stress  Eyes:   no blurry vision, no floaters, no recent vision changes, + wears glasses or contacts  ENT:   no hearing loss, no loose or painful teeth, edentulous but no dentures  Hematologic:  no easy bruising, no abnormal bleeding, no  clotting disorder, no frequent epistaxis  Endocrine:  + diabetes, does check CBG's at home           Physical Exam:   BP 132/73    Pulse 79    Temp 97.6 F (36.4 C) (Skin)    Resp 20    Ht 5\' 9"  (1.753 m)    Wt 213 lb (96.6 kg)    SpO2 94% Comment: RA   BMI 31.45 kg/m   General:  Obese male NAP    HEENT:  Unremarkable   Neck:   no JVD, no bruits, no adenopathy   Chest:   clear to auscultation, symmetrical breath sounds, no wheezes, no rhonchi   CV:   RRR, grade III/VI crescendo/decrescendo murmur heard best at LLSB,  no diastolic murmur  Abdomen:  soft, non-tender, no masses   Extremities:  warm, well-perfused, pulses diminished, no LE edema  Rectal/GU  Deferred  Neuro:   Grossly non-focal and symmetrical throughout  Skin:   Clean and dry, no rashes, no breakdown   Diagnostic Tests:  EKG: NSR w/out significant AV conduction delay (02/19/2020)   ECHOCARDIOGRAM REPORT       Patient Name:  Nathaniel Little Date of Exam: 02/07/2020  Medical Rec #: [id number]  Height:    69.0 in  Accession #:  [id number]  Weight:    221.0 lb  Date of Birth: 1946/01/28  BSA:     2.156 m  Patient Age:  76 years   BP:      122/64 mmHg  Patient Gender: M      HR:      78 bpm.  Exam Location: [facility]   Procedure: 2D Echo, Cardiac Doppler and Color Doppler   Indications:  I35.8 Other nonrheumatic aortic valve disorders,; R01.1  Murmur    History:    Patient has prior history of Echocardiogram examinations,  most         recent 07/17/2018. COPD, Aortic Valve Disease,         Signs/Symptoms:Murmur; Risk Factors:Sleep Apnea,  Hypertension,         Diabetes, Dyslipidemia and Current Smoker. Patient states  he has         no SOB but has lung problems due to COPD. He uses a  motorized         wheelchair to ambulate.    Sonographer:  Salvadore Dom RVT, RDCS (AE), RDMS  Referring Phys: [id number] CARMEN L  GONZALEZ   IMPRESSIONS    1. Left ventricular ejection fraction, by estimation, is 60 to 65%. The  left ventricle has normal function. The left ventricle has no regional  wall motion abnormalities. There is mild left ventricular hypertrophy.  Left ventricular diastolic parameters  are consistent with Grade I diastolic dysfunction (impaired relaxation).  2. Right ventricular systolic function is normal. The right ventricular  size is normal.  3. Left atrial size was mildly dilated.  4. The mitral valve is normal in structure. No evidence of mitral valve  regurgitation.  5. Aortic valve DVI = 0.23, Peak gradient 64mmHg, Mean Gradient 37, peak  velocity 4.4m/s. Unable to determine valve morphology due to image  quality. Aortic valve regurgitation is not visualized. Severe aortic valve  stenosis. Aortic valve area, by VTI  measures 0.85 cm.  6. Aortic root is upper limit of normal in size.   Comparison(s):   FINDINGS  Left Ventricle: Left ventricular ejection fraction, by estimation, is 60  to 65%. The left ventricle has normal function. The left ventricle has no  regional wall motion abnormalities. The left ventricular internal cavity  size was normal in size. There is  mild left ventricular hypertrophy. Left ventricular diastolic parameters  are consistent with Grade I diastolic dysfunction (impaired relaxation).   Right Ventricle: The right ventricular size is normal. No increase in  right ventricular wall thickness. Right ventricular systolic function is  normal.   Left Atrium: Left atrial size was mildly dilated.   Right Atrium: Right atrial size was normal in size.   Pericardium: There is no evidence of pericardial effusion.   Mitral Valve: The mitral valve is normal in structure. No evidence of  mitral valve regurgitation.   Tricuspid Valve: The tricuspid valve is grossly normal. Tricuspid valve  regurgitation is not demonstrated.   Aortic Valve: Aortic  valve DVI = 0.23, Peak gradient 15mmHg, Mean Gradient  37, peak velocity 4.87m/s. Unable to determine valve morphology due to  image quality. Aortic valve regurgitation is not visualized. Severe aortic  stenosis is present. Aortic valve  mean gradient measures 34.5 mmHg. Aortic valve peak gradient measures  66.6 mmHg. Aortic valve area, by VTI measures 0.85 cm.   Pulmonic Valve: The pulmonic valve was not well visualized. Pulmonic valve  regurgitation is not visualized.   Aorta: Root is upper limit of normal in size.   Venous: The inferior vena cava was not well visualized.   IAS/Shunts: No atrial level shunt detected by color flow Doppler.     LEFT VENTRICLE  PLAX 2D  LVIDd:     4.50 cm   Diastology  LVIDs:     2.40 cm   LV e' lateral:  7.94 cm/s  LV PW:     1.70 cm   LV E/e' lateral: 9.2  LV IVS:    1.40 cm   LV e' medial:  5.55 cm/s  LVOT diam:  2.10 cm   LV E/e' medial: 13.1  LV SV:     75  LV SV Index:  35  LVOT Area:   3.46 cm    LV Volumes (MOD)  LV vol d, MOD A2C: 75.1 ml  LV vol d, MOD A4C: 121.0 ml  LV vol s, MOD A2C: 16.2 ml  LV vol s, MOD A4C: 29.4 ml  LV SV MOD A2C:   58.9 ml  LV SV MOD A4C:   121.0 ml  LV SV MOD BP:   79.0 ml   RIGHT VENTRICLE  RV S prime:   16.80 cm/s  TAPSE (M-mode): 2.9 cm   LEFT ATRIUM       Index    RIGHT ATRIUM      Index  LA diam:    4.20 cm 1.95 cm/m RA Area:   19.20 cm  LA Vol (A2C):  77.7 ml 36.05 ml/m RA Volume:  56.00 ml 25.98 ml/m  LA Vol (A4C):  90.9 ml 42.17 ml/m  LA Biplane Vol: 88.3 ml 40.96 ml/m  AORTIC VALVE          PULMONIC VALVE  AV Area (Vmax):  0.82 cm   PV Vmax:    1.00 m/s  AV Area (Vmean):  0.85 cm   PV Peak grad: 4.0 mmHg  AV Area (VTI):   0.85 cm  AV Vmax:      408.00 cm/s  AV Vmean:     274.500 cm/s  AV VTI:      0.886 m  AV Peak Grad:   66.6 mmHg  AV Mean Grad:    34.5 mmHg  LVOT Vmax:     96.70 cm/s  LVOT Vmean:    67.700 cm/s  LVOT VTI:     0.217 m  LVOT/AV VTI ratio: 0.25    AORTA  Ao Root diam: 3.60 cm  Ao Asc diam: 3.70 cm   MITRAL VALVE        TRICUSPID VALVE  MV Area (PHT): 2.54 cm  TR Peak grad:  6.0 mmHg  MV Decel Time: 299 msec  TR Vmax:    122.00 cm/s  MV E velocity: 72.70 cm/s  MV A velocity: 99.50 cm/s SHUNTS  MV E/A ratio: 0.73    Systemic VTI: 0.22 m               Systemic Diam: 2.10 cm   Kate Sable MD  Electronically signed by Kate Sable MD  Signature Date/Time: 02/07/2020/6:21:18 PM      RIGHT/LEFT HEART CATH AND CORONARY ANGIOGRAPHY  Conclusion   The left ventricular ejection fraction is 55-65% by visual estimate.  LV end diastolic pressure is normal.  The left ventricular systolic function is normal.  There is severe aortic valve stenosis.   Procedural Details  Technical Details Cardiac Catheterization Procedure Note  Name: Nathaniel Little MRN: [id number] DOB: [birth year]  Procedure: Left Heart Cath, Selective Coronary Angiography, LV angiography, right heart catheterization  Indication:   Nathaniel Little a 75 y.o.male Smoker, COPD Prior significant alcohol intake.  OSA, on CPAP HTN Murmur Prostate cancer 07/25/2017 emergency department via EMS with 1 day of progressive worsening shortness of breath.   increasing productive cough and shortness of breath  Constipated CT ABD: Pancreatic findings :chronic pancreatitis and calcifications,  Spoke with presents for preoperative evaluation cardiac catheterization for chest tightness shortness of breath severe aortic valve stenosis preop TAVR   Procedural details: The right groin was prepped, draped, and anesthetized with 1% lidocaine. Using modified  Seldinger technique, a 5 French sheath was introduced into the right femoral artery.  7 French sheath introduced into right femoral vein for  right heart catheter pressures,  standard Judkins catheters (JL 4, JR 4 and pigtail catheter, Swan-Ganz catheter) were used for coronary angiography and left ventriculography. Catheter exchanges were performed over a guidewire. There were no immediate procedural complications. The patient was transferred to the post catheterization recovery area for further monitoring.  Moderate sedation: 1. Sedation used: 1 mg Versed 50 mcg fentanyl 2. Time of administration:       10:45 AM     Time patient left for recovery 11:20 AM Sedation time 35 minutes 3. I was Face to Face with the patient during this time: (code: [phone])   Procedural Findings:     Coronary angiography:  Coronary dominance: Right  Left mainstem:   Large vessel that bifurcates into the LAD and left circumflex, no significant disease noted  Left anterior descending (LAD):   Large vessel that extends to the apical region, diagonal branch 2 of moderate size, no significant disease noted  Left circumflex (LCx):  Large vessel with OM branch 2, no significant disease noted  Right coronary artery (RCA):  Right dominant vessel with PL and PDA, no significant disease noted  Left ventriculography: Left ventricular systolic function is normal, LVEF is estimated at 55-65%, there is no significant mitral regurgitation ,  Severe aortic valve stenosis Mean gradient estimated 44.88 mmHg Valve area estimated 0.95 cm  Right heart pressures RA 3 RV 26/0/3 PA 29/10/18 Wedge 7 LV 199/-2/7 Ao 142/62/90 Cardiac output 6.24 cardiac index 2.91  Final Conclusions:   No significant coronary artery disease Normal right heart pressures Severe aortic valve stenosis Mean gradient 44 mmHg estimated valve area 0.9 cm  Recommendations:  He has been referred to TAVR clinic, has appointment Monday 2:00 with Dr. Angelena Form Feb 25, 2020  Ida Rogue 02/21/2020, 11:28 AM  Estimated blood loss <50 mL.   During this procedure medications were  administered to achieve and maintain moderate conscious sedation while the patient's heart rate, blood pressure, and oxygen saturation were continuously monitored and I was present face-to-face 100% of this time.  Medications (Filter: Administrations occurring from 1000 to 1120 on 02/21/20) (important) Continuous medications are totaled by the amount administered until 02/21/20 1120.  midazolam (VERSED) injection (mg) Total dose:  1 mg  Date/Time  Rate/Dose/Volume Action  02/21/20 1025  1 mg Given    fentaNYL (SUBLIMAZE) injection (mcg) Total dose:  50 mcg  Date/Time  Rate/Dose/Volume Action  02/21/20 1025  50 mcg Given    iohexol (OMNIPAQUE) 300 MG/ML solution (mL) Total volume:  100 mL  Date/Time  Rate/Dose/Volume Action  02/21/20 1106  100 mL Given    aspirin EC tablet 81 mg (mg) Total dose:  0 mg Dosing weight:  98.4  Date/Time  Rate/Dose/Volume Action  02/21/20   Canceled Entry    atorvastatin (LIPITOR) tablet 80 mg (mg) Total dose:  Cannot be calculated* Dosing weight:  98.4  *Administration dose not documented Date/Time  Rate/Dose/Volume Action  02/21/20 1000  *Not included in total Automatically Held    carvedilol (COREG) tablet 3.125 mg (mg) Total dose:  Cannot be calculated* Dosing weight:  98.4  *Administration dose not documented Date/Time  Rate/Dose/Volume Action  02/21/20 1000  *Not included in total Automatically Held    clopidogrel (PLAVIX) tablet 75 mg (mg) Total dose:  Cannot be calculated*  *Administration dose not documented Date/Time  Rate/Dose/Volume Action  02/21/20 1000  *  Not included in total Automatically Held    fluticasone furoate-vilanterol (BREO ELLIPTA) 100-25 MCG/INH 1 puff (puff) Total dose:  Cannot be calculated* Dosing weight:  98.4  *Administration dose not documented Date/Time  Rate/Dose/Volume Action  02/21/20 1000  *Not included in total Automatically Held    pantoprazole (PROTONIX) EC tablet 40 mg (mg) Total dose:   Cannot be calculated*  *Administration dose not documented Date/Time  Rate/Dose/Volume Action  02/21/20 1000  *Not included in total Automatically Held    simethicone (MYLICON) chewable tablet 80 mg (mg) Total dose:  Cannot be calculated* Dosing weight:  99.7  *Administration dose not documented Date/Time  Rate/Dose/Volume Action  02/21/20 1000  *Not included in total Automatically Held    tiotropium (SPIRIVA) inhalation capsule ([facility] use ONLY) 18 mcg (mcg) Total dose:  Cannot be calculated* Dosing weight:  98.4  *Administration dose not documented Date/Time  Rate/Dose/Volume Action  02/21/20 1000  *Not included in total Automatically Held    Sedation Time  Sedation Time Physician-1: 40 minutes 35 seconds  Contrast  Medication Name Total Dose  iohexol (OMNIPAQUE) 300 MG/ML solution 100 mL    Radiation/Fluoro  Fluoro time: 7 (min) DAP: 35.1 (Gycm2) Cumulative Air Kerma: 436 (mGy)  Coronary Findings  Diagnostic Dominance: Right No diagnostic findings have been documented. Intervention  No interventions have been documented. Wall Motion  Resting               Left Heart  Left Ventricle The left ventricular size is normal. The left ventricular systolic function is normal. LV end diastolic pressure is normal. The left ventricular ejection fraction is 55-65% by visual estimate. No regional wall motion abnormalities.  Aortic Valve There is severe aortic valve stenosis. The aortic valve is calcified. There is restricted aortic valve motion.  Coronary Diagrams  Diagnostic Dominance: Right  Intervention  Implants   Vascular Products  Device Closure Mynxgrip 6/60f - [id number] - Implanted  Inventory item: DEVICE CLOSURE MYNXGRIP 6/78F Model/Cat number: [id number]  Manufacturer: [employer] Lot number: [id number]  Device identifier: [id number] Device identifier type: GS1  Area Of Implantation: Groin    GUDID Information  Request status Successful    Brand  name: [facility] Version/Model: GQ6761  Company name: [employer]. MRI safety info as of 02/21/20: MR Safe  Contains dry or latex rubber: No    GMDN P.T. name: Wound hydrogel dressing, non-antimicrobial    As of 02/21/2020  Status: Implanted      Syngo Images  Show images for CARDIAC CATHETERIZATION Images on Long Term Storage  Show images for Anees, [place] to Procedure Log  Procedure Log    Hemo Data (last 4 days) before discharge   AO Systolic Cath Pressure  AO Diastolic Cath Pressure  AO Mean Cath Pressure  LV Systolic Cath Pressure  LV End Diastolic  LV Systolic  LV End Diastolic  LV dP/dt  PA Systolic Cath Pressure  PA Diastolic Cath Pressure  PA Mean Cath Pressure  RA Wedge A Wave  RA Wedge V Wave  RV Systolic Cath Pressure  RV Diastolic Cath Pressure  RV End Diastolic  RV Systolic  RV End Diastolic  RV dP/dt  PCW A Wave  PCW V Wave  PCW Mean  AO O2 Sat  PA O2 Sat  AO O2 Sat  Fick C.O.  Fick C.I.   --  --  --  --  --  199 mmHg  7 mmHg  1632 mmHg/sec  --  --  --  6  mmHg  3 mmHg  --  --  --  26 mmHg  3 mmHg  528 mmHg/sec  12 mmHg  10 mmHg  7 mmHg  --  --  --  6.24 L/min  2.91 L/min/m2   --  --  --  --  --  --  --  --  29 mmHg  10 mmHg  18 mmHg  --  --  --  --  --  --  --  --  --  --  --  --  --  --  --  --   --  --  --  --  --  --  --  --  --  --  --  --  --  26 mmHg  0 mmHg  3 mmHg  --  --  --  --  --  --  --  --  --  --  --   144  64 mmHg  93 mmHg  --  --  --  --  --  --  --  --  --  --  --  --  --  --  --  --  --  --  --  --  --  --  --  --   --  --  --  --  --  --  --  --  --  --  --  --  --  --  --  --  --  --  --  --  --  --  92 %  --  SA  --  --   --  --  --  --  --  --  --  --  --  --  --  --  --  --  --  --  --  --  --  --  --  --  --  PA  --  --  --   --  --  --  207 mmHg  17 mmHg  --  --  --  --  --  --  --  --  --  --  --  --  --  --  --  --  --  --  --  --  --  --   --  --  --  206 mmHg  16 mmHg  --  --  --  --  --  --  --  --  --  --  --  --  --  --  --  --  --   --  --  --  --  --   --  --  --  199 mmHg  7 mmHg  --  --  --  --  --  --  --  --  --  --  --  --  --  --  --  --  --  --  --  --  --  --   142  61 mmHg  90 mmHg                                                     Cardiac TAVR CT  TECHNIQUE: The patient was scanned on a Siemens Force 546 slice scanner. A 120 kV retrospective scan was triggered in the ascending thoracic aorta at 140 HU's. Gantry rotation speed was 250 msecs and collimation was .6 mm. No  beta blockade or nitro were given. The 3D data set was reconstructed in 5% intervals of the R-R cycle. Systolic and diastolic phases were analyzed on a dedicated work station using MPR, MIP and VRT modes. The patient received 80 cc of contrast.  FINDINGS: Aortic Valve: Tri- leaflet calcified with restricted leaflet motion Calcium score for valve 4503 Very bulky calcification of the right coronary leaflet  Aorta: No aneurysm normal arch vessels moderate calcific atherosclerosis  Sino-tubular Junction: 30 mm  Ascending Thoracic Aorta: 35 mm  Aortic Arch: 32 mm  Descending Thoracic Aorta: 27 mm  Sinus of Valsalva Measurements:  Non-coronary: 35.5 mm  Right - coronary: 34 mm  Left -   coronary: 36.3 mm  Coronary Artery Height above Annulus:  Left Main: 13.8 mm above annulus  Right Coronary: 18 mm above annulus  Virtual Basal Annulus Measurements:  Maximum / Minimum Diameter: 31 mm x 26.5 mm  Perimeter: 90 mm  Area: 655 mm 2  Coronary Arteries: Sufficient height above annulus for deployment  Optimum Fluoroscopic Angle for Delivery: LAO 5 Caudal 8  IMPRESSION: 1. Tri leaflet AV with calcium score 4503 annular area of 655 mm2 suitable for a 29 mm Sapien 3 valve Heavy calcification fo the right coronary cusp  2.  Optimum angiographic angle for deployment LAO 5 Caudal 8 degrees  3.  Normal aortic root 3.5 cm  4.  Coronary arteries sufficient height above annulus for deployment  5.  Study not optimized to R/O LAA thrombus cannot r/o filling defect but patient in NSR  [employer]  Electronically Signed: By: Jenkins Rouge M.D. On: 03/11/2020 12:27    CT ANGIOGRAPHY CHEST, ABDOMEN AND PELVIS  TECHNIQUE: Non-contrast CT of the chest was initially obtained.  Multidetector CT imaging through the chest, abdomen and pelvis was performed using the standard protocol during bolus administration of intravenous contrast. Multiplanar reconstructed images and MIPs were obtained and reviewed to evaluate the vascular anatomy.  CONTRAST:  172mL OMNIPAQUE IOHEXOL 350 MG/ML SOLN  COMPARISON:  CT of the abdomen and pelvis 02/18/2020.  FINDINGS: CTA CHEST FINDINGS  Cardiovascular: Heart size is normal. There is no significant pericardial fluid, thickening or pericardial calcification. There is aortic atherosclerosis, as well as atherosclerosis of the great vessels of the mediastinum and the coronary arteries, including calcified atherosclerotic plaque in the left main, left anterior descending and right coronary arteries. Severe thickening calcification of the aortic valve.  Mediastinum/Lymph Nodes: No pathologically enlarged mediastinal or hilar lymph nodes. Esophagus is unremarkable in appearance. No axillary lymphadenopathy.  Lungs/Pleura: In the medial aspect of the superior segment of the left lower lobe (axial image 44 of series 5) there is a macrolobulated 1.3 x 1.0 cm pulmonary nodule. No other suspicious appearing pulmonary nodules or masses are noted. No acute consolidative airspace disease. No pleural effusions.  Musculoskeletal/Soft Tissues: There are no aggressive appearing lytic or blastic lesions noted in the visualized portions of the skeleton.  CTA ABDOMEN AND PELVIS FINDINGS  Hepatobiliary: Diffuse low attenuation throughout the hepatic parenchyma, indicative of hepatic steatosis. No discrete cystic or solid hepatic lesions. No  intra or extrahepatic biliary ductal dilatation. Stent extending from the common bile duct (coiled upon itself) into the second portion of the duodenum. Status post cholecystectomy.  Pancreas: Status post Whipple procedure. No pancreatic mass in the residual body and tail of the pancreas. Large calcification in the distal body of the pancreas measuring 1.2 cm, presumably sequela of prior pancreatitis. No peripancreatic fluid collections or inflammatory changes.  Spleen: Unremarkable.  Adrenals/Urinary Tract: 1.1 cm low-attenuation lesion in the lower pole of the right kidney, compatible with a simple cyst. Other subcentimeter low-attenuation lesions in both kidneys, too small to characterize, but statistically likely to represent small cysts. Exophytic partially calcified centrally low-attenuation 1 cm lesion in the upper pole of the left kidney, compatible with a mildly complicated (Bosniak class 2) cyst. No definite suspicious renal lesions. No hydroureteronephrosis. Urinary bladder is normal in appearance. Bilateral adrenal glands are normal in appearance.  Stomach/Bowel: Postoperative changes of Whipple procedure. Remaining portions of the stomach are otherwise unremarkable. No pathologic dilatation of small bowel or colon. The appendix is not confidently identified and may be surgically absent. Regardless, there are no inflammatory changes noted adjacent to the cecum to suggest the presence of an acute appendicitis at this time.  Vascular/Lymphatic: Aortic atherosclerosis, without evidence of aneurysm or dissection in the abdominal or pelvic vasculature. Vascular findings and measurements pertinent to potential TAVR procedure, as detailed below. No lymphadenopathy noted in the abdomen or pelvis.  Reproductive: Fiducial markers noted in the prostate gland. Seminal vesicles are unremarkable.  Other: No significant volume of ascites.  No  pneumoperitoneum.  Musculoskeletal: Small well-defined sclerotic lesion in the anterior aspect of the L2 vertebral body, stable compared to prior study from 2018, considered likely benign (presumably a small bone island). There are no other aggressive appearing lytic or blastic lesions noted in the visualized portions of the skeleton.  VASCULAR MEASUREMENTS PERTINENT TO TAVR:  AORTA:  Minimal Aortic Diameter-18 x 17 mm  Severity of Aortic Calcification-moderate to severe  RIGHT PELVIS:  Right Common Iliac Artery -  Minimal Diameter-11.0 x 10.8 mm  Tortuosity-mild  Calcification-moderate  Right External Iliac Artery -  Minimal Diameter-7.2 x 5.1 mm  Tortuosity-moderate  Calcification-mild-to-moderate  Right Common Femoral Artery -  Minimal Diameter-7.5 x 5.9 mm  Tortuosity-mild  Calcification-mild-to-moderate  LEFT PELVIS:  Left Common Iliac Artery -  Minimal Diameter-10.8 x 8.8 mm  Tortuosity-mild  Calcification-moderate  Left External Iliac Artery -  Minimal Diameter-8.0 x 6.2 mm  Tortuosity-moderate  Calcification-mild to moderate  Left Common Femoral Artery -  Minimal Diameter-7.2 x 8.3 mm  Tortuosity-mild  Calcification-mild  Review of the MIP images confirms the above findings.  IMPRESSION: 1. Vascular findings and measurements pertinent to potential TAVR procedure, as detailed above. 2. Severe thickening calcification of the aortic valve, compatible with reported clinical history of severe aortic stenosis. 3. In the medial aspect of the superior segment of the left lower lobe there is a 1.3 x 1.0 cm macrolobulated pulmonary nodule, concerning for primary bronchogenic neoplasm. Further evaluation with PET-CT should be considered in the near future to better evaluate this finding. 4. Hepatic steatosis. 5. Additional incidental findings, as above.  These results will be called to the ordering  clinician or representative by the Radiologist Assistant, and communication documented in the PACS or [employer].   Electronically Signed   By: Vinnie Langton M.D.   On: 03/11/2020 14:21     NUCLEAR MEDICINE PET SKULL BASE TO THIGH  TECHNIQUE: 12.3 mCi F-18 FDG was injected intravenously. Full-ring PET imaging was performed from the skull base to thigh after the radiotracer. CT data was obtained and used for attenuation correction and anatomic localization.  Fasting blood glucose: 143 mg/dl  COMPARISON:  CTA chest abdomen pelvis dated 03/11/2020  FINDINGS: Mediastinal blood pool activity: SUV max 2.3  Liver activity: SUV max NA  NECK: No hypermetabolic cervical lymphadenopathy.  15 mm lesion in  the left posterior parotid gland (series 3/image 39), max SUV 5.1, raising concern for primary parotid neoplasm, benign or malignant. Benign lesion such as Warthin's tumor is common, but this is technically indeterminate.  Incidental CT findings: none  CHEST: 14 x 9 mm lobulated nodule in the medial left lower lobe (series 3/image 108), demonstrating only mild hypermetabolism, max SUV 2.0. However, this raises concern for primary bronchogenic neoplasm.  No hypermetabolic thoracic lymphadenopathy.  Incidental CT findings: Atherosclerotic calcifications of the aortic arch. Mild three-vessel coronary atherosclerosis.  ABDOMEN/PELVIS: No abnormal hypermetabolism in the liver, spleen, pancreas, or adrenal glands.  No hypermetabolic abdominopelvic lymphadenopathy.  Postsurgical changes related to Whipple procedure.  Biliary stent.  Mild rectal wall thickening (series 3/image 255), equivocal. Mild rectal hypermetabolism, max SUV 7.3, favored to be physiologic.  Brachytherapy seeds in the prostate.  Incidental CT findings: Atherosclerotic calcifications of the aortic arch. Moderate fat containing left inguinal hernia.  SKELETON: No focal  hypermetabolic activity to suggest skeletal metastasis.  Incidental CT findings: Degenerative changes of the visualized thoracolumbar spine.  IMPRESSION: 14 mm left lower lobe nodule demonstrates mild hypermetabolism, raising concern for primary bronchogenic neoplasm.  No findings suspicious for metastatic disease.  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  Additional postsurgical and ancillary findings as above.   Electronically Signed   By: Julian Hy M.D.   On: 03/17/2020 13:49     Impression:  Patient has stage D1 severe symptomatic aortic stenosis.  He has a longstanding history of symptoms of exertional shortness of breath consistent with chronic diastolic congestive heart failure, New [place] Heart Association functional class II.  The patient is not very active physically and I suspect that he might have more advanced symptoms if he tried to exert himself more on a regular basis.  The patient also has longstanding history of tobacco abuse, COPD, and obstructive sleep apnea which unquestionably contribute to his symptoms.  I have personally reviewed the patient's recent transthoracic echocardiogram, diagnostic cardiac catheterization, CT angiograms, and PET CT scan.  Echocardiogram reveals severe aortic stenosis with preserved left ventricular systolic function.  The patient has a trileaflet aortic valve with severe calcification involving all 3 leaflets and somewhat asymmetrical bulky calcification in the right coronary leaflet with severely restricted leaflet mobility.  Peak velocity across aortic valve measured greater than 4.1 m/s corresponding to mean transvalvular gradient estimated 37 mmHg.  The DVI is notably low at 0.23 and aortic valve area calculated only 0.85 cm.  Left ventricular systolic function remains preserved.  Diagnostic cardiac catheterization  confirmed the presence of severe aortic stenosis and was notable for mild nonobstructive coronary artery disease.  Right heart pressures were very minimally elevated.  I agree the patient would benefit from aortic valve replacement.  I would be somewhat reluctant to consider this patient candidate for conventional surgery because of his age, numerous comorbid medical problems and somewhat limited physical mobility.  CT angiography reveals anatomical findings consistent with severe aortic stenosis that appears suitable for treatment using transcatheter aortic valve replacement without any significant complicating features, and the patient appears to have adequate pelvic vascular access for transfemoral approach.  CT angiography of the chest also reveals a small (1.3 x 1.0 cm) pulmonary nodule in the left lower lobe that is mildly hypermetabolic on PET CT imaging, suspicious for slow-growing primary bronchogenic neoplasm.  Possibility of metastatic disease from the patient's previous pancreatic tumor seems less likely although not impossible  given histology of his pancreatic lesion.  Because the patient lives alone and reports that he does not have anyone who would likely be able to stay with him for a period of time following hospital discharge, he may need short-term placement in a skilled nursing facility for his recovery following TAVR.   Plan:  The patient was counseled at length regarding treatment alternatives for management of severe symptomatic aortic stenosis. Alternative approaches such as conventional aortic valve replacement, transcatheter aortic valve replacement, and continued medical therapy without intervention were compared and contrasted at length.  The risks associated with conventional surgical aortic valve replacement were discussed in detail, as were expectations for post-operative convalescence, and why I would be reluctant to consider this patient a candidate for conventional  surgery.  Issues specific to transcatheter aortic valve replacement were discussed including questions about long term valve durability, the potential for paravalvular leak, possible increased risk of need for permanent pacemaker placement, and other technical complications related to the procedure itself.  Long-term prognosis with medical therapy was discussed. This discussion was placed in the context of the patient's own specific clinical presentation and past medical history.  All of his questions have been addressed.  The patient desires to proceed with transcatheter aortic valve replacement in the near future.  We tentatively plan for surgery on April 01, 2020.  The patient has been instructed to hold Plavix therapy for 7 days prior to his procedure.  He has also been advised to quit smoking, and results of PET CT scan will be discussed with plans to refer the patient to the multidisciplinary thoracic oncology clinic following TAVR.  Following the decision to proceed with transcatheter aortic valve replacement, a discussion has been held regarding what types of management strategies would be attempted intraoperatively in the event of life-threatening complications, including whether or not the patient would be considered a candidate for the use of cardiopulmonary bypass and/or conversion to open sternotomy for attempted surgical intervention.  The patient specifically requests that should a potentially life-threatening complication develop we would attempt emergency median sternotomy and/or other aggressive surgical procedures if the patient were to experience complications that were felt to be potentially correctable with surgical intervention.  The patient has been advised of a variety of complications that might develop including but not limited to risks of death, stroke, paravalvular leak, aortic dissection or other major vascular complications, aortic annulus rupture, device embolization, cardiac rupture  or perforation, mitral regurgitation, acute myocardial infarction, arrhythmia, heart block or bradycardia requiring permanent pacemaker placement, congestive heart failure, respiratory failure, renal failure, pneumonia, infection, other late complications related to structural valve deterioration or migration, or other complications that might ultimately cause a temporary or permanent loss of functional independence or other long term morbidity.  The patient provides full informed consent for the procedure as described and all questions were answered.      I spent in excess of 90 minutes during the conduct of this office consultation and >50% of this time involved direct face-to-face encounter with the patient for counseling and/or coordination of their care.     Valentina Gu. Roxy Manns, MD 03/21/2020 1:31 PM

## 2020-03-24 ENCOUNTER — Other Ambulatory Visit: Payer: Self-pay

## 2020-03-24 DIAGNOSIS — I350 Nonrheumatic aortic (valve) stenosis: Secondary | ICD-10-CM

## 2020-03-27 NOTE — Progress Notes (Addendum)
[place], [place] [address] [place] [address] Phone: [phone] Fax: [place], [place]. [place] [address] Phone: [phone] Fax: [phone]      Your procedure is scheduled on Tuesday, June 29th.  Report to [facility] Main Entrance "A" at 7:15 A.M., and check in at the Admitting office.  Call this number if you have problems the morning of surgery:  [phone]  Call [phone] if you have any questions prior to your surgery date Monday-Friday 8am-4pm    Remember:  Do not eat or drink after midnight the night before your surgery     Take these medicines the morning of surgery   Albuterol nebulizer - if needed  Albuterol inhaler - if needed (bring with you on day of surgery)  Symbicort inhaler - if needed (bring with you on day of surgery)   Stop taking Plavix on Tuesday, June 22nd, per Dr. Camillia Herter instructions.  As of today, STOP taking any Aspirin (unless otherwise instructed by your surgeon) and Aspirin containing products, Aleve, Naproxen, Ibuprofen, Motrin, Advil, Goody's, BC's, all herbal medications, fish oil, and all vitamins.   WHAT DO I DO ABOUT MY DIABETES MEDICATION?   [name] Stop taking Metformin on Sunday, June 27th  . Do not take oral diabetes medicines (pills) the morning of surgery. - Metformin  . THE NIGHT BEFORE SURGERY, take 10 units (1/2 of normal dose) of Lantus at bedtime.     . THE MORNING OF SURGERY, do NOT take Lantus (per Dr. Camillia Herter instructions).   HOW TO MANAGE YOUR DIABETES BEFORE AND AFTER SURGERY  Why is it important to control my blood sugar before and after surgery? . Improving blood sugar levels before and after surgery helps healing and can limit problems. . A way of improving blood sugar control is eating a healthy diet by: o  Eating less sugar and carbohydrates o  Increasing  activity/exercise o  Talking with your doctor about reaching your blood sugar goals . High blood sugars (greater than 180 mg/dL) can raise your risk of infections and slow your recovery, so you will need to focus on controlling your diabetes during the weeks before surgery. . Make sure that the doctor who takes care of your diabetes knows about your planned surgery including the date and location.  How do I manage my blood sugar before surgery? . Check your blood sugar at least 4 times a day, starting 2 days before surgery, to make sure that the level is not too high or low. . Check your blood sugar the morning of your surgery when you wake up and every 2 hours until you get to the Short Stay unit. o If your blood sugar is less than 70 mg/dL, you will need to treat for low blood sugar: - Do not take insulin. - Treat a low blood sugar (less than 70 mg/dL) with  cup of clear juice (cranberry or apple), 4 glucose tablets, OR glucose gel. - Recheck blood sugar in 15 minutes after treatment (to make sure it is greater than 70 mg/dL). If your blood sugar is not greater than 70 mg/dL on recheck, call [phone] for further instructions. . Report your blood sugar to the short stay nurse when you get to Short Stay.  . If you are admitted to the hospital after surgery: o Your blood sugar will be checked by the staff and you  will probably be given insulin after surgery (instead of oral diabetes medicines) to make sure you have good blood sugar levels. o The goal for blood sugar control after surgery is 80-180 mg/dL.               The Day of Surgery:                    Do not wear jewelry            Do not wear lotions, powders, colognes, or deodorant.            Men may shave face and neck.            Do not bring valuables to the hospital.            [facility] is not responsible for any belongings or valuables.  Do NOT Smoke (Tobacco/Vapping) or drink Alcohol 24 hours prior to your  procedure If you use a CPAP at night, you may bring all equipment for your overnight stay.   Contacts, glasses, dentures or bridgework may not be worn into surgery.      For patients admitted to the hospital, discharge time will be determined by your treatment team.   Patients discharged the day of surgery will not be allowed to drive home, and someone needs to stay with them for 24 hours.    Special instructions:   - Preparing For Surgery  Before surgery, you can play an important role. Because skin is not sterile, your skin needs to be as free of germs as possible. You can reduce the number of germs on your skin by washing with CHG (chlorahexidine gluconate) Soap before surgery.  CHG is an antiseptic cleaner which kills germs and bonds with the skin to continue killing germs even after washing.    Oral Hygiene is also important to reduce your risk of infection.  Remember - BRUSH YOUR TEETH THE MORNING OF SURGERY WITH YOUR REGULAR TOOTHPASTE  Please do not use if you have an allergy to CHG or antibacterial soaps. If your skin becomes reddened/irritated stop using the CHG.  Do not shave (including legs and underarms) for at least 48 hours prior to first CHG shower. It is OK to shave your face.  Please follow these instructions carefully.   1. Shower the NIGHT BEFORE SURGERY and the MORNING OF SURGERY with CHG Soap.   2. If you chose to wash your hair, wash your hair first as usual with your normal shampoo.  3. After you shampoo, rinse your hair and body thoroughly to remove the shampoo.  4. Use CHG as you would any other liquid soap. You can apply CHG directly to the skin and wash gently with a scrungie or a clean washcloth.   5. Apply the CHG Soap to your body ONLY FROM THE NECK DOWN.  Do not use on open wounds or open sores. Avoid contact with your eyes, ears, mouth and genitals (private parts). Wash Face and genitals (private parts)  with your normal soap.   6. Wash  thoroughly, paying special attention to the area where your surgery will be performed.  7. Thoroughly rinse your body with warm water from the neck down.  8. DO NOT shower/wash with your normal soap after using and rinsing off the CHG Soap.  9. Pat yourself dry with a CLEAN TOWEL.  10. Wear CLEAN PAJAMAS to bed the night before surgery, wear comfortable clothes the morning of surgery  11. Place CLEAN SHEETS on your bed the night of your first shower and DO NOT SLEEP WITH PETS.   Day of Surgery:   Do not apply any deodorants/lotions.  Please wear clean clothes to the hospital/surgery center.   Remember to brush your teeth WITH YOUR REGULAR TOOTHPASTE.   Please read over the following fact sheets that you were given.

## 2020-03-28 ENCOUNTER — Encounter (HOSPITAL_COMMUNITY)
Admission: RE | Admit: 2020-03-28 | Discharge: 2020-03-28 | Disposition: A | Discharge status: Home / Self Care | Payer: Medicare Other | Source: Ambulatory Visit | Attending: Cardiovascular Disease | Admitting: Cardiovascular Disease

## 2020-03-28 ENCOUNTER — Telehealth: Payer: Self-pay | Admitting: Family Medicine

## 2020-03-28 ENCOUNTER — Ambulatory Visit (HOSPITAL_COMMUNITY)
Admission: RE | Admit: 2020-03-28 | Discharge: 2020-03-28 | Disposition: A | Discharge status: Home / Self Care | Payer: Medicare Other | Source: Ambulatory Visit | Attending: Cardiovascular Disease | Admitting: Cardiovascular Disease

## 2020-03-28 ENCOUNTER — Other Ambulatory Visit: Payer: Self-pay

## 2020-03-28 ENCOUNTER — Telehealth: Payer: Self-pay | Admitting: Cardiovascular Disease

## 2020-03-28 ENCOUNTER — Other Ambulatory Visit
Admission: RE | Admit: 2020-03-28 | Discharge: 2020-03-28 | Disposition: A | Discharge status: Home / Self Care | Payer: Medicare Other | Source: Ambulatory Visit | Attending: Cardiovascular Disease | Admitting: Cardiovascular Disease

## 2020-03-28 ENCOUNTER — Encounter (HOSPITAL_COMMUNITY): Payer: Self-pay

## 2020-03-28 DIAGNOSIS — Z01818 Encounter for other preprocedural examination: Secondary | ICD-10-CM | POA: Diagnosis present

## 2020-03-28 DIAGNOSIS — R911 Solitary pulmonary nodule: Secondary | ICD-10-CM | POA: Insufficient documentation

## 2020-03-28 DIAGNOSIS — I350 Nonrheumatic aortic (valve) stenosis: Secondary | ICD-10-CM

## 2020-03-28 DIAGNOSIS — I440 Atrioventricular block, first degree: Secondary | ICD-10-CM | POA: Insufficient documentation

## 2020-03-28 DIAGNOSIS — Z01812 Encounter for preprocedural laboratory examination: Secondary | ICD-10-CM | POA: Diagnosis present

## 2020-03-28 DIAGNOSIS — R9431 Abnormal electrocardiogram [ECG] [EKG]: Secondary | ICD-10-CM | POA: Insufficient documentation

## 2020-03-28 DIAGNOSIS — Z20822 Contact with and (suspected) exposure to covid-19: Secondary | ICD-10-CM | POA: Diagnosis not present

## 2020-03-28 LAB — COMPREHENSIVE METABOLIC PANEL
ALT: 30 U/L (ref 0–44)
AST: 29 U/L (ref 15–41)
Albumin: 3.5 g/dL (ref 3.5–5.0)
Alkaline Phosphatase: 98 U/L (ref 38–126)
Anion gap: 11 (ref 5–15)
BUN: 18 mg/dL (ref 8–23)
CO2: 22 mmol/L (ref 22–32)
Calcium: 9.2 mg/dL (ref 8.9–10.3)
Chloride: 105 mmol/L (ref 98–111)
Creatinine, Ser: 0.82 mg/dL (ref 0.61–1.24)
GFR calc Af Amer: >60 mL/min (ref >60)
GFR calc non Af Amer: >60 mL/min (ref >60)
Glucose, Bld: 148 mg/dL — ABNORMAL HIGH (ref 70–99)
Potassium: 3.9 mmol/L (ref 3.5–5.1)
Sodium: 138 mmol/L (ref 135–145)
Total Bilirubin: 0.5 mg/dL (ref 0.3–1.2)
Total Protein: 6.5 g/dL (ref 6.5–8.1)

## 2020-03-28 LAB — URINALYSIS, ROUTINE W REFLEX MICROSCOPIC
Bacteria, UA: NONE SEEN
Bilirubin Urine: NEGATIVE
Glucose, UA: NEGATIVE mg/dL
Hgb urine dipstick: NEGATIVE
Ketones, ur: NEGATIVE mg/dL
Leukocytes,Ua: NEGATIVE
Nitrite: NEGATIVE
Protein, ur: 30 mg/dL — AB
Specific Gravity, Urine: 1.021 (ref 1.005–1.030)
pH: 5 (ref 5.0–8.0)

## 2020-03-28 LAB — CBC
HCT: 37 % — ABNORMAL LOW (ref 39.0–52.0)
Hemoglobin: 11.4 g/dL — ABNORMAL LOW (ref 13.0–17.0)
MCH: 24.7 pg — ABNORMAL LOW (ref 26.0–34.0)
MCHC: 30.8 g/dL (ref 30.0–36.0)
MCV: 80.3 fL (ref 80.0–100.0)
Platelets: 261 10*3/uL (ref 150–400)
RBC: 4.61 MIL/uL (ref 4.22–5.81)
RDW: 18.3 % — ABNORMAL HIGH (ref 11.5–15.5)
WBC: 11.6 10*3/uL — ABNORMAL HIGH (ref 4.0–10.5)
nRBC: 0 % (ref 0.0–0.2)

## 2020-03-28 LAB — HEMOGLOBIN A1C
Hgb A1c MFr Bld: 8.5 % — ABNORMAL HIGH (ref 4.8–5.6)
Mean Plasma Glucose: 197.25 mg/dL

## 2020-03-28 LAB — TYPE AND SCREEN
ABO/RH(D): O POS
Antibody Screen: NEGATIVE

## 2020-03-28 LAB — SURGICAL PCR SCREEN
MRSA, PCR: NEGATIVE
Staphylococcus aureus: NEGATIVE

## 2020-03-28 LAB — BRAIN NATRIURETIC PEPTIDE: B Natriuretic Peptide: 79.9 pg/mL (ref 0.0–100.0)

## 2020-03-28 LAB — PROTIME-INR
INR: 1 (ref 0.8–1.2)
Prothrombin Time: 13.1 seconds (ref 11.4–15.2)

## 2020-03-28 LAB — APTT: aPTT: 39 seconds — ABNORMAL HIGH (ref 24–36)

## 2020-03-28 LAB — ABO/RH: ABO/RH(D): O POS

## 2020-03-28 LAB — GLUCOSE, CAPILLARY: Glucose-Capillary: 152 mg/dL — ABNORMAL HIGH (ref 70–99)

## 2020-03-28 NOTE — Telephone Encounter (Signed)
Can you tell him I said best of luck and to let us know how he does? Thanks

## 2020-03-28 NOTE — Telephone Encounter (Signed)
FYI fwd to Dr. Rockey Situ

## 2020-03-28 NOTE — Progress Notes (Signed)
    e.PCP - [place] Cardiologist - T.GOLLAN  Chest x-ray - 6/25 EKG -6/25  Stress Test - NA ECHO - 5/21 Cardiac Cath - 5/21  Sleep Study - YES CPAP - YES  Fasting Blood Sugar - 158 Checks Blood Sugar ___2__ times a day  Blood Thinner Instructions:STOP PLAVIX THE 22ND Aspirin Instructions:STOP TODAY  STOP METFORMAN THE 27TH        COVID TEST- FOR TODAY   Anesthesia review: HEART HX  Patient denies shortness of breath, fever, cough and chest pain at PAT appointment   All instructions explained to the patient, with a verbal understanding of the material. Patient agrees to go over the instructions while at home for a better understanding. Patient also instructed to self quarantine after being tested for COVID-19. The opportunity to ask questions was provided.

## 2020-03-28 NOTE — Telephone Encounter (Signed)
Pt wanted to Tell Elmyra Ricks that Tuesday morning at 9:15 he'll be in [facility] to have a stent put in /

## 2020-03-28 NOTE — Telephone Encounter (Signed)
The pt was notified and he verbalize understanding.

## 2020-03-28 NOTE — Telephone Encounter (Signed)
Patient calling  Patient will be having stent procedure on 04/01/20 Wanted to make Dr Rockey Situ and office aware

## 2020-03-29 LAB — SARS CORONAVIRUS 2 (TAT 6-24 HRS): SARS Coronavirus 2: NEGATIVE

## 2020-03-31 ENCOUNTER — Ambulatory Visit (INDEPENDENT_AMBULATORY_CARE_PROVIDER_SITE_OTHER): Payer: Medicare Other | Admitting: General Practice

## 2020-03-31 ENCOUNTER — Telehealth: Payer: Medicare Other

## 2020-03-31 ENCOUNTER — Telehealth: Payer: Self-pay | Admitting: General Practice

## 2020-03-31 DIAGNOSIS — I5032 Chronic diastolic (congestive) heart failure: Secondary | ICD-10-CM

## 2020-03-31 DIAGNOSIS — I350 Nonrheumatic aortic (valve) stenosis: Secondary | ICD-10-CM

## 2020-03-31 DIAGNOSIS — F324 Major depressive disorder, single episode, in partial remission: Secondary | ICD-10-CM

## 2020-03-31 DIAGNOSIS — I10 Essential (primary) hypertension: Secondary | ICD-10-CM | POA: Diagnosis not present

## 2020-03-31 DIAGNOSIS — J449 Chronic obstructive pulmonary disease, unspecified: Secondary | ICD-10-CM

## 2020-03-31 DIAGNOSIS — E1165 Type 2 diabetes mellitus with hyperglycemia: Secondary | ICD-10-CM

## 2020-03-31 DIAGNOSIS — Z720 Tobacco use: Secondary | ICD-10-CM

## 2020-03-31 MED ORDER — SODIUM CHLORIDE 0.9 % IV SOLN
1.5000 g | INTRAVENOUS | Status: AC
  Administered 2020-04-01: 1.5 g via INTRAVENOUS
  Filled 2020-03-31 (×2): qty 1.5

## 2020-03-31 MED ORDER — SODIUM CHLORIDE 0.9 % IV SOLN
INTRAVENOUS | Status: DC
  Filled 2020-03-31 (×2): qty 30

## 2020-03-31 MED ORDER — MAGNESIUM SULFATE 50 % IJ SOLN
40.0000 meq | INTRAMUSCULAR | Status: DC
  Filled 2020-03-31: qty 9.85

## 2020-03-31 MED ORDER — POTASSIUM CHLORIDE 2 MEQ/ML IV SOLN
80.0000 meq | INTRAVENOUS | Status: DC
  Filled 2020-03-31: qty 40

## 2020-03-31 MED ORDER — DEXMEDETOMIDINE HCL IN NACL 400 MCG/100ML IV SOLN
0.1000 ug/kg/h | INTRAVENOUS | Status: AC
  Administered 2020-04-01: .5 ug/kg/h via INTRAVENOUS
  Filled 2020-03-31: qty 100

## 2020-03-31 MED ORDER — VANCOMYCIN HCL 1500 MG/300ML IV SOLN
1500.0000 mg | INTRAVENOUS | Status: AC
  Administered 2020-04-01: 1500 mg via INTRAVENOUS
  Filled 2020-03-31 (×2): qty 300

## 2020-03-31 MED ORDER — NOREPINEPHRINE 4 MG/250ML-% IV SOLN
0.0000 ug/min | INTRAVENOUS | Status: AC
  Administered 2020-04-01: 1 ug/min via INTRAVENOUS
  Filled 2020-03-31: qty 250

## 2020-03-31 NOTE — Chronic Care Management (AMB) (Signed)
Chronic Care Management   Follow Up Note   03/31/2020 Name: Nathaniel Little MRN: [id number] DOB: [birth year]  Referred by: Verl Bangs, FNP Reason for referral : Chronic Care Management (RNCM: Chronic Disease Managment and Care Coordination Needs)   Nathaniel Little is a 74 y.o. year old male who is a primary care patient of Lorine Bears, [name], FNP. The CCM team was consulted for assistance with chronic disease management and care coordination needs.    Review of patient status, including review of consultants reports, relevant laboratory and other test results, and collaboration with appropriate care team members and the patient's provider was performed as part of comprehensive patient evaluation and provision of chronic care management services.    SDOH (Social Determinants of Health) assessments performed: Yes See Care Plan activities for detailed interventions related to SDOH)  SDOH Interventions     Most Recent Value  SDOH Interventions  SDOH Interventions for the Following Domains Social Connections, Financial Strain  Physical Activity Interventions Other (Comments)  [is active on most days]  Social Connections Interventions Other (Comment)  [has a good social support system. Has a lady friend.]       Outpatient Encounter Medications as of 03/31/2020  Medication Sig Note  . albuterol (PROVENTIL) (2.5 MG/3ML) 0.083% nebulizer solution Take 2.5 mg by nebulization every 6 (six) hours as needed for wheezing or shortness of breath.   [name] albuterol (VENTOLIN HFA) 108 (90 Base) MCG/ACT inhaler Inhale 2 puffs into the lungs every 6 (six) hours as needed for wheezing or shortness of breath.   [name] aspirin EC 81 MG tablet Take 81 mg by mouth daily.   [name] atorvastatin (LIPITOR) 80 MG tablet Take 80 mg by mouth daily.   . carvedilol (COREG) 3.125 MG tablet Take 1 tablet (3.125 mg total) by mouth 2 (two) times daily.   . cloNIDine (CATAPRES) 0.1 MG tablet Take 0.1 mg by mouth 2 (two) times daily.   .  clopidogrel (PLAVIX) 75 MG tablet TAKE 1 TABLET BY MOUTH ONCE DAILY (Patient taking differently: Take 75 mg by mouth daily. ) 03/28/2020: Has stopped for procedure.  . diclofenac Sodium (VOLTAREN) 1 % GEL Apply 2 g topically in the morning and at bedtime.   . gabapentin (NEURONTIN) 100 MG capsule Take 100-200 mg by mouth at bedtime.    . hydrochlorothiazide (HYDRODIURIL) 25 MG tablet Take 1 tablet (25 mg total) by mouth daily.   . insulin glargine (LANTUS) 100 unit/mL SOPN Inject 20 Units into the skin at bedtime. 03/28/2020: Pt will be using 10 units prior to procedure.  . metFORMIN (GLUCOPHAGE) 500 MG tablet Take 1,000 mg by mouth 2 (two) times daily.    . pantoprazole (PROTONIX) 40 MG tablet Take 1 tablet (40 mg total) by mouth 2 (two) times daily.   [name] SPIRIVA HANDIHALER 18 MCG inhalation capsule INHALE CONTENTS OF 1 CAPSULE ONCE DAILY (Patient taking differently: Place 18 mcg into inhaler and inhale daily. )   . SYMBICORT 80-4.5 MCG/ACT inhaler INHALE 2 PUFFS BY MOUTH TWICE DAILY (Patient taking differently: Inhale 2 puffs into the lungs 2 (two) times daily. )    Facility-Administered Encounter Medications as of 03/31/2020  Medication  . [START ON 04/01/2020] cefUROXime (ZINACEF) 1.5 g in sodium chloride 0.9 % 100 mL IVPB  . [START ON 04/01/2020] dexmedetomidine (PRECEDEX) 400 MCG/100ML (4 mcg/mL) infusion  . [START ON 04/01/2020] heparin 30,000 units/NS 1000 mL solution for CELLSAVER  . [START ON 04/01/2020] magnesium sulfate (IV Push/IM) injection 40 mEq  . [  START ON 04/01/2020] norepinephrine (LEVOPHED) '4mg'$  in 224m premix infusion  . [START ON 04/01/2020] potassium chloride injection 80 mEq  . [START ON 04/01/2020] vancomycin (VANCOREADY) IVPB 1500 mg/300 mL     Objective:   Goals Addressed              This Visit's Progress   .  RNCM: Pt- "My blood sugar this am was 187" (pt-stated)        CARE PLAN ENTRY (see longtitudinal plan of care for additional care plan  information)  Objective:  Lab Results  Component Value Date   HGBA1C 8.5 (H) 03/28/2020 .   Lab Results  Component Value Date   CREATININE 0.82 03/28/2020   CREATININE 0.92 02/21/2020   CREATININE 0.84 02/20/2020 .   .Marland KitchenNo results found for: EGFR  Current Barriers:  .Marland KitchenKnowledge Deficits related to basic Diabetes pathophysiology and self care/management . Knowledge Deficits related to medications used for management of diabetes . FFilm/video editor. Limited Social Support  Case Manager Clinical Goal(s):  Over the next 120 days, patient will demonstrate improved adherence to prescribed treatment plan for diabetes self care/management as evidenced by:  . daily monitoring and recording of CBG  . adherence to ADA/ carb modified diet . exercise 3/4 days/week for at least 15 minutes of moderate activity . adherence to prescribed medication regimen  Interventions:  . Provided education to patient about basic DM disease process . Reviewed medications with patient and discussed importance of medication adherence . Discussed plans with patient for ongoing care management follow up and provided patient with direct contact information for care management team . Provided patient with written educational materials related to hypo and hyperglycemia and importance of correct treatment . Advised patient, providing education and rationale, to check cbg daily and record, calling pcp for findings outside established parameters.   . Review of patient status, including review of consultants reports, relevant laboratory and other test results, and medications completed.  Patient Self Care Activities:  . UNABLE to independently manage DM as evidence of most recent hemoglobin A1C of 8.5 on 03-28-2020 . Adheres to prescribed ADA/carb modified  Initial goal documentation     .  RNCM: Pt-"I am down to smoking 2 cigarettes a day" (pt-stated)        CARE PLAN ENTRY (see longtitudinal plan of care for  additional care plan information)  Current Barriers:  . Tobacco abuse of 66 years; currently smoking 1 ppd . Previous quit attempts, unsuccessful multiple successful using the patient verbalized he has been unsuccessful at quitting smoking.  . Reports smoking within 30 minutes of waking up . Reports triggers to smoke include: stress and habit . Reports motivation to quit smoking includes: Knows it would be better for his health and well being. Has a lady friend that he likes to spend time with . On a scale of 1-10, reports MOTIVATION to quit is 8 . On a scale of 1-10, reports CONFIDENCE in quitting is 7  Clinical Goal(s):  .Marland KitchenOver the next 240 days, patient will work with RComptrollerLicensed Clinical Social Worker and provider towards tobacco cessation   Interventions: . Evaluation of current treatment plan reviewed . Provided contact information for [place] [facility]uit [facility] (1-800-QUIT-NOW). Patient will outreach this group for support. . Discussed plans with patient for ongoing care management follow up and provided patient with direct contact information for care management team . Reviewed scheduled/upcoming provider appointments including: the patient has a planned hospitalization on  04-01-2020. Wants to talk to the pcp after his procedure on the possibility of a referral to acupuncture to help with quitting. He has tried other means and has not been successful.  . Referred patient to pharmacy team . Provided contact information for [place] [facility]uit [facility] (1-800-QUIT-NOW). Patient will outreach this group for support. . Evaluation of current treatment plan reviewed   Patient Self Care Activities:  . Patient will try distraction and walking outside. as a habit during cravings  . Patient will commit to reducing tobacco consumption  Initial goal documentation     .  RNCM: Pt-"I have a lot of health problems" (pt-stated)        CARE PLAN ENTRY (see longtitudinal plan of care for additional  care plan information)  Current Barriers:  . Chronic Disease Management support, education, and care coordination needs related to CHF, HTN, HLD, COPD, Depression, and aortic stenosis  Clinical Goal(s) related to CHF, HTN, HLD, COPD, Depression, and aortic stenosis  Over the next 120 days, patient will:  . Work with the care management team to address educational, disease management, and care coordination needs  . Begin or continue self health monitoring activities as directed today Measure and record blood pressure 3/4 times per week, Measure and record weight daily, and adhere to a heart healthy/ADA diet . Call provider office for new or worsened signs and symptoms Blood pressure findings outside established parameters, Weight outside established parameters, Oxygen saturation lower than established parameter, Chest pain, Shortness of breath, and New or worsened symptom related to COPD/Aortic stenosis/HLD/Depression and other chronic conditions . Call care management team with questions or concerns . Verbalize basic understanding of patient centered plan of care established today  Interventions related to CHF, HTN, HLD, COPD, Depression, and aortic stenosis :  . Evaluation of current treatment plans and patient's adherence to plan as established by provider.  The patient will be admitted on 04-01-2020 to have stents placed. Per the patient he has blockages in the main artery of his heart. The patient verbalized he would likely be in the hospital for about 4 days. Echo in May showed HFpEF of 60-65%, the patient does have aortic stenosis. The patient knows he is at high risk of heart attack and stroke.  . Assessed patient understanding of disease states.  The patient verbalized understanding of his chronic conditions. The patient wants to be active and take care of himself. The patient denies any acute distress.  . Assessed patient's education and care coordination needs.  The patient verbalized after  he has his procedure he wants to do his part to maintain his health and well being. The patient knows he needs to watch his dietary restrictions and wants to work on smoking cessation.  . Provided disease specific education to patient.  Education on pacing activity and monitoring blood pressure. Blood pressure was elevated several days ago at 132/101, but the patient was outside and feels he was over exerting himself. Other recent readings were 135/70.  The patient states he will follow the recommendations of the healthcare team. The patient states he is ready for his procedure.  Nash Dimmer with appropriate clinical care team members regarding patient needs.  The patient is currently working with the LCSW and pharmacist.   Patient Self Care Activities related to CHF, HTN, HLD, COPD, Depression, and aortic stenosis :  . Patient is unable to independently self-manage chronic health conditions  Initial goal documentation         Plan:  The care management team will reach out to the patient again over the next 30 to 60 days.    Noreene Larsson RN, MSN, [place] Mobile: [phone]

## 2020-03-31 NOTE — Patient Instructions (Signed)
Visit Information  Goals Addressed              This Visit's Progress   .  RNCM: Pt- "My blood sugar this am was 187" (pt-stated)        CARE PLAN ENTRY (see longtitudinal plan of care for additional care plan information)  Objective:  Lab Results  Component Value Date   HGBA1C 8.5 (H) 03/28/2020 .   Lab Results  Component Value Date   CREATININE 0.82 03/28/2020   CREATININE 0.92 02/21/2020   CREATININE 0.84 02/20/2020 .   [name] No results found for: EGFR  Current Barriers:  [name] Knowledge Deficits related to basic Diabetes pathophysiology and self care/management . Knowledge Deficits related to medications used for management of diabetes . Film/video editor . Limited Social Support  Case Manager Clinical Goal(s):  Over the next 120 days, patient will demonstrate improved adherence to prescribed treatment plan for diabetes self care/management as evidenced by:  . daily monitoring and recording of CBG  . adherence to ADA/ carb modified diet . exercise 3/4 days/week for at least 15 minutes of moderate activity . adherence to prescribed medication regimen  Interventions:  . Provided education to patient about basic DM disease process . Reviewed medications with patient and discussed importance of medication adherence . Discussed plans with patient for ongoing care management follow up and provided patient with direct contact information for care management team . Provided patient with written educational materials related to hypo and hyperglycemia and importance of correct treatment . Advised patient, providing education and rationale, to check cbg daily and record, calling pcp for findings outside established parameters.   . Review of patient status, including review of consultants reports, relevant laboratory and other test results, and medications completed.  Patient Self Care Activities:  . UNABLE to independently manage DM as evidence of most recent hemoglobin A1C of  8.5 on 03-28-2020 . Adheres to prescribed ADA/carb modified  Initial goal documentation     .  RNCM: Pt-"I am down to smoking 2 cigarettes a day" (pt-stated)        CARE PLAN ENTRY (see longtitudinal plan of care for additional care plan information)  Current Barriers:  . Tobacco abuse of 66 years; currently smoking 1 ppd . Previous quit attempts, unsuccessful multiple successful using the patient verbalized he has been unsuccessful at quitting smoking.  . Reports smoking within 30 minutes of waking up . Reports triggers to smoke include: stress and habit . Reports motivation to quit smoking includes: Knows it would be better for his health and well being. Has a lady friend that he likes to spend time with . On a scale of 1-10, reports MOTIVATION to quit is 8 . On a scale of 1-10, reports CONFIDENCE in quitting is 7  Clinical Goal(s):  [name] Over the next 240 days, patient will work with Comptroller Licensed Clinical Social Worker and provider towards tobacco cessation   Interventions: . Evaluation of current treatment plan reviewed . Provided contact information for [place] [facility] (1-800-QUIT-NOW). Patient will outreach this group for support. . Discussed plans with patient for ongoing care management follow up and provided patient with direct contact information for care management team . Reviewed scheduled/upcoming provider appointments including: the patient has a planned hospitalization on 04-01-2020. Wants to talk to the pcp after his procedure on the possibility of a referral to acupuncture to help with quitting. He has tried other means and has not been successful.  . Referred patient  to pharmacy team . Provided contact information for [facility] (1-800-QUIT-NOW). Patient will outreach this group for support. . Evaluation of current treatment plan reviewed   Patient Self Care Activities:  . Patient will try distraction and walking outside. as a habit during cravings    . Patient will commit to reducing tobacco consumption  Initial goal documentation     .  RNCM: Pt-"I have a lot of health problems" (pt-stated)        CARE PLAN ENTRY (see longtitudinal plan of care for additional care plan information)  Current Barriers:  . Chronic Disease Management support, education, and care coordination needs related to CHF, HTN, HLD, COPD, Depression, and aortic stenosis  Clinical Goal(s) related to CHF, HTN, HLD, COPD, Depression, and aortic stenosis  Over the next 120 days, patient will:  . Work with the care management team to address educational, disease management, and care coordination needs  . Begin or continue self health monitoring activities as directed today Measure and record blood pressure 3/4 times per week, Measure and record weight daily, and adhere to a heart healthy/ADA diet . Call provider office for new or worsened signs and symptoms Blood pressure findings outside established parameters, Weight outside established parameters, Oxygen saturation lower than established parameter, Chest pain, Shortness of breath, and New or worsened symptom related to COPD/Aortic stenosis/HLD/Depression and other chronic conditions . Call care management team with questions or concerns . Verbalize basic understanding of patient centered plan of care established today  Interventions related to CHF, HTN, HLD, COPD, Depression, and aortic stenosis :  . Evaluation of current treatment plans and patient's adherence to plan as established by provider.  The patient will be admitted on 04-01-2020 to have stents placed. Per the patient he has blockages in the main artery of his heart. The patient verbalized he would likely be in the hospital for about 4 days. Echo in May showed HFpEF of 60-65%, the patient does have aortic stenosis. The patient knows he is at high risk of heart attack and stroke.  . Assessed patient understanding of disease states.  The patient verbalized  understanding of his chronic conditions. The patient wants to be active and take care of himself. The patient denies any acute distress.  . Assessed patient's education and care coordination needs.  The patient verbalized after he has his procedure he wants to do his part to maintain his health and well being. The patient knows he needs to watch his dietary restrictions and wants to work on smoking cessation.  . Provided disease specific education to patient.  Education on pacing activity and monitoring blood pressure. Blood pressure was elevated several days ago at 132/101, but the patient was outside and feels he was over exerting himself. Other recent readings were 135/70.  The patient states he will follow the recommendations of the healthcare team. The patient states he is ready for his procedure.  Nash Dimmer with appropriate clinical care team members regarding patient needs.  The patient is currently working with the LCSW and pharmacist.   Patient Self Care Activities related to CHF, HTN, HLD, COPD, Depression, and aortic stenosis :  . Patient is unable to independently self-manage chronic health conditions  Initial goal documentation        Patient verbalizes understanding of instructions provided today.   The care management team will reach out to the patient again over the next 30 to 60 days.   Noreene Larsson RN, MSN, [place]  [facility] Mobile: [phone]

## 2020-04-01 ENCOUNTER — Encounter (HOSPITAL_COMMUNITY): Payer: Self-pay | Admitting: Cardiovascular Disease

## 2020-04-01 ENCOUNTER — Other Ambulatory Visit: Payer: Self-pay | Admitting: Physician Assistant

## 2020-04-01 ENCOUNTER — Inpatient Hospital Stay (HOSPITAL_COMMUNITY): Payer: Medicare Other | Admitting: Vascular Surgery

## 2020-04-01 ENCOUNTER — Encounter (HOSPITAL_COMMUNITY)
Admission: RE | Disposition: A | Discharge status: Home / Self Care | Payer: Self-pay | Source: Ambulatory Visit | Attending: Cardiovascular Disease

## 2020-04-01 ENCOUNTER — Inpatient Hospital Stay (HOSPITAL_COMMUNITY)
Admission: RE | Admit: 2020-04-01 | Discharge: 2020-04-02 | DRG: 267 | Disposition: A | Discharge status: Home / Self Care | Payer: Medicare Other | Attending: Cardiovascular Disease | Admitting: Cardiovascular Disease

## 2020-04-01 ENCOUNTER — Inpatient Hospital Stay (HOSPITAL_COMMUNITY): Payer: Medicare Other

## 2020-04-01 ENCOUNTER — Other Ambulatory Visit: Payer: Self-pay

## 2020-04-01 ENCOUNTER — Inpatient Hospital Stay (HOSPITAL_COMMUNITY): Payer: Medicare Other | Admitting: Certified Registered Nurse Anesthetist

## 2020-04-01 DIAGNOSIS — Z20822 Contact with and (suspected) exposure to covid-19: Secondary | ICD-10-CM | POA: Diagnosis present

## 2020-04-01 DIAGNOSIS — G4733 Obstructive sleep apnea (adult) (pediatric): Secondary | ICD-10-CM | POA: Diagnosis present

## 2020-04-01 DIAGNOSIS — Z794 Long term (current) use of insulin: Secondary | ICD-10-CM | POA: Diagnosis not present

## 2020-04-01 DIAGNOSIS — Z7902 Long term (current) use of antithrombotics/antiplatelets: Secondary | ICD-10-CM | POA: Diagnosis not present

## 2020-04-01 DIAGNOSIS — I5030 Unspecified diastolic (congestive) heart failure: Secondary | ICD-10-CM | POA: Diagnosis present

## 2020-04-01 DIAGNOSIS — E785 Hyperlipidemia, unspecified: Secondary | ICD-10-CM | POA: Diagnosis present

## 2020-04-01 DIAGNOSIS — I110 Hypertensive heart disease with heart failure: Secondary | ICD-10-CM | POA: Diagnosis present

## 2020-04-01 DIAGNOSIS — Z7951 Long term (current) use of inhaled steroids: Secondary | ICD-10-CM | POA: Diagnosis not present

## 2020-04-01 DIAGNOSIS — Z952 Presence of prosthetic heart valve: Secondary | ICD-10-CM

## 2020-04-01 DIAGNOSIS — K219 Gastro-esophageal reflux disease without esophagitis: Secondary | ICD-10-CM | POA: Diagnosis present

## 2020-04-01 DIAGNOSIS — K5900 Constipation, unspecified: Secondary | ICD-10-CM | POA: Diagnosis present

## 2020-04-01 DIAGNOSIS — I2510 Atherosclerotic heart disease of native coronary artery without angina pectoris: Secondary | ICD-10-CM | POA: Diagnosis present

## 2020-04-01 DIAGNOSIS — F17210 Nicotine dependence, cigarettes, uncomplicated: Secondary | ICD-10-CM | POA: Diagnosis present

## 2020-04-01 DIAGNOSIS — Z8546 Personal history of malignant neoplasm of prostate: Secondary | ICD-10-CM | POA: Diagnosis not present

## 2020-04-01 DIAGNOSIS — Z720 Tobacco use: Secondary | ICD-10-CM | POA: Diagnosis present

## 2020-04-01 DIAGNOSIS — Z006 Encounter for examination for normal comparison and control in clinical research program: Secondary | ICD-10-CM | POA: Diagnosis not present

## 2020-04-01 DIAGNOSIS — Z7982 Long term (current) use of aspirin: Secondary | ICD-10-CM | POA: Diagnosis not present

## 2020-04-01 DIAGNOSIS — Z452 Encounter for adjustment and management of vascular access device: Secondary | ICD-10-CM

## 2020-04-01 DIAGNOSIS — E119 Type 2 diabetes mellitus without complications: Secondary | ICD-10-CM | POA: Diagnosis present

## 2020-04-01 DIAGNOSIS — I5032 Chronic diastolic (congestive) heart failure: Secondary | ICD-10-CM | POA: Diagnosis present

## 2020-04-01 DIAGNOSIS — E669 Obesity, unspecified: Secondary | ICD-10-CM | POA: Diagnosis present

## 2020-04-01 DIAGNOSIS — J449 Chronic obstructive pulmonary disease, unspecified: Secondary | ICD-10-CM | POA: Diagnosis present

## 2020-04-01 DIAGNOSIS — I10 Essential (primary) hypertension: Secondary | ICD-10-CM | POA: Diagnosis present

## 2020-04-01 DIAGNOSIS — Z953 Presence of xenogenic heart valve: Secondary | ICD-10-CM

## 2020-04-01 DIAGNOSIS — R911 Solitary pulmonary nodule: Secondary | ICD-10-CM | POA: Diagnosis present

## 2020-04-01 DIAGNOSIS — C3432 Malignant neoplasm of lower lobe, left bronchus or lung: Secondary | ICD-10-CM | POA: Diagnosis present

## 2020-04-01 DIAGNOSIS — I350 Nonrheumatic aortic (valve) stenosis: Secondary | ICD-10-CM

## 2020-04-01 DIAGNOSIS — Z79899 Other long term (current) drug therapy: Secondary | ICD-10-CM

## 2020-04-01 DIAGNOSIS — Z6831 Body mass index (BMI) 31.0-31.9, adult: Secondary | ICD-10-CM

## 2020-04-01 HISTORY — PX: TEE WITHOUT CARDIOVERSION: SHX5443

## 2020-04-01 HISTORY — DX: Presence of prosthetic heart valve: Z95.2

## 2020-04-01 HISTORY — PX: TRANSCATHETER AORTIC VALVE REPLACEMENT, TRANSFEMORAL: SHX6400

## 2020-04-01 LAB — GLUCOSE, CAPILLARY
Glucose-Capillary: 153 mg/dL — ABNORMAL HIGH (ref 70–99)
Glucose-Capillary: 124 mg/dL — ABNORMAL HIGH (ref 70–99)
Glucose-Capillary: 163 mg/dL — ABNORMAL HIGH (ref 70–99)
Glucose-Capillary: 174 mg/dL — ABNORMAL HIGH (ref 70–99)

## 2020-04-01 LAB — POCT I-STAT, CHEM 8
BUN: 13 mg/dL (ref 8–23)
BUN: 13 mg/dL (ref 8–23)
Calcium, Ion: 1.28 mmol/L (ref 1.15–1.40)
Calcium, Ion: 1.26 mmol/L (ref 1.15–1.40)
Chloride: 101 mmol/L (ref 98–111)
Chloride: 101 mmol/L (ref 98–111)
Creatinine, Ser: 0.7 mg/dL (ref 0.61–1.24)
Creatinine, Ser: 0.7 mg/dL (ref 0.61–1.24)
Glucose, Bld: 153 mg/dL — ABNORMAL HIGH (ref 70–99)
Glucose, Bld: 137 mg/dL — ABNORMAL HIGH (ref 70–99)
HCT: 29 % — ABNORMAL LOW (ref 39.0–52.0)
HCT: 30 % — ABNORMAL LOW (ref 39.0–52.0)
Hemoglobin: 9.9 g/dL — ABNORMAL LOW (ref 13.0–17.0)
Hemoglobin: 10.2 g/dL — ABNORMAL LOW (ref 13.0–17.0)
Potassium: 3.4 mmol/L — ABNORMAL LOW (ref 3.5–5.1)
Potassium: 3.4 mmol/L — ABNORMAL LOW (ref 3.5–5.1)
Sodium: 139 mmol/L (ref 135–145)
Sodium: 140 mmol/L (ref 135–145)
TCO2: 26 mmol/L (ref 22–32)
TCO2: 28 mmol/L (ref 22–32)

## 2020-04-01 LAB — BLOOD GAS, ARTERIAL
Acid-Base Excess: 0.9 mmol/L (ref 0.0–2.0)
Bicarbonate: 25.9 mmol/L (ref 20.0–28.0)
FIO2: 21
O2 Saturation: 97 %
Patient temperature: 37
pCO2 arterial: 48.3 mmHg — ABNORMAL HIGH (ref 32.0–48.0)
pH, Arterial: 7.348 — ABNORMAL LOW (ref 7.350–7.450)
pO2, Arterial: 96.2 mmHg (ref 83.0–108.0)

## 2020-04-01 LAB — POCT ACTIVATED CLOTTING TIME
Activated Clotting Time: 132 seconds
Activated Clotting Time: 110 seconds

## 2020-04-01 SURGERY — IMPLANTATION, AORTIC VALVE, TRANSCATHETER, FEMORAL APPROACH
Anesthesia: Monitor Anesthesia Care

## 2020-04-01 MED ORDER — HEPARIN (PORCINE) IN NACL 1000-0.9 UT/500ML-% IV SOLN
INTRAVENOUS | Status: AC
  Filled 2020-04-01: qty 1500

## 2020-04-01 MED ORDER — CHLORHEXIDINE GLUCONATE 0.12 % MT SOLN
OROMUCOSAL | Status: AC
  Administered 2020-04-01: 15 mL via OROMUCOSAL
  Filled 2020-04-01: qty 15

## 2020-04-01 MED ORDER — ONDANSETRON HCL 4 MG/2ML IJ SOLN: 4.0000 mg | Freq: Four times a day (QID) | INTRAMUSCULAR | Status: DC | PRN

## 2020-04-01 MED ORDER — CLOPIDOGREL BISULFATE 75 MG PO TABS
75.0000 mg | ORAL_TABLET | Freq: Every day | ORAL | Status: DC
  Administered 2020-04-01 – 2020-04-02 (×2): 75 mg via ORAL
  Filled 2020-04-01 (×2): qty 1

## 2020-04-01 MED ORDER — PHENYLEPHRINE HCL (PRESSORS) 10 MG/ML IV SOLN
INTRAVENOUS | Status: DC | PRN
  Administered 2020-04-01 (×2): 40 ug via INTRAVENOUS

## 2020-04-01 MED ORDER — SODIUM CHLORIDE 0.9 % IV SOLN: INTRAVENOUS | Status: DC

## 2020-04-01 MED ORDER — LACTATED RINGERS IV SOLN: INTRAVENOUS | Status: DC

## 2020-04-01 MED ORDER — MORPHINE SULFATE (PF) 2 MG/ML IV SOLN: 1.0000 mg | INTRAVENOUS | Status: DC | PRN

## 2020-04-01 MED ORDER — LIDOCAINE HCL (PF) 1 % IJ SOLN
INTRAMUSCULAR | Status: AC
  Filled 2020-04-01: qty 30

## 2020-04-01 MED ORDER — TRAMADOL HCL 50 MG PO TABS: 50.0000 mg | ORAL_TABLET | ORAL | Status: DC | PRN

## 2020-04-01 MED ORDER — LACTATED RINGERS IV SOLN
INTRAVENOUS | Status: DC | PRN
Start: 2020-04-01 — End: 2020-04-01

## 2020-04-01 MED ORDER — VANCOMYCIN HCL IN DEXTROSE 1-5 GM/200ML-% IV SOLN
1000.0000 mg | Freq: Once | INTRAVENOUS | Status: AC
  Administered 2020-04-01: 1000 mg via INTRAVENOUS
  Filled 2020-04-01: qty 200

## 2020-04-01 MED ORDER — PANTOPRAZOLE SODIUM 40 MG PO TBEC
40.0000 mg | DELAYED_RELEASE_TABLET | Freq: Two times a day (BID) | ORAL | Status: DC
  Administered 2020-04-01 – 2020-04-02 (×3): 40 mg via ORAL
  Filled 2020-04-01 (×3): qty 1

## 2020-04-01 MED ORDER — TIOTROPIUM BROMIDE MONOHYDRATE 18 MCG IN CAPS: 18.0000 ug | ORAL_CAPSULE | Freq: Every day | RESPIRATORY_TRACT | Status: DC

## 2020-04-01 MED ORDER — SODIUM CHLORIDE 0.9% FLUSH
3.0000 mL | Freq: Two times a day (BID) | INTRAVENOUS | Status: DC
  Administered 2020-04-01 – 2020-04-02 (×2): 3 mL via INTRAVENOUS

## 2020-04-01 MED ORDER — PROPOFOL 10 MG/ML IV BOLUS
INTRAVENOUS | Status: DC | PRN
  Administered 2020-04-01: 10 mg via INTRAVENOUS

## 2020-04-01 MED ORDER — HEPARIN SODIUM (PORCINE) 1000 UNIT/ML IJ SOLN
INTRAMUSCULAR | Status: DC | PRN
  Administered 2020-04-01: 15000 [IU] via INTRAVENOUS

## 2020-04-01 MED ORDER — IOHEXOL 350 MG/ML SOLN
INTRAVENOUS | Status: DC | PRN
  Administered 2020-04-01: 60 mL via INTRA_ARTERIAL

## 2020-04-01 MED ORDER — MIDAZOLAM HCL 2 MG/2ML IJ SOLN
INTRAMUSCULAR | Status: AC
  Filled 2020-04-01: qty 2

## 2020-04-01 MED ORDER — OXYCODONE HCL 5 MG PO TABS: 5.0000 mg | ORAL_TABLET | ORAL | Status: DC | PRN

## 2020-04-01 MED ORDER — ACETAMINOPHEN 325 MG PO TABS: 650.0000 mg | ORAL_TABLET | Freq: Four times a day (QID) | ORAL | Status: DC | PRN

## 2020-04-01 MED ORDER — ACETAMINOPHEN 650 MG RE SUPP: 650.0000 mg | Freq: Four times a day (QID) | RECTAL | Status: DC | PRN

## 2020-04-01 MED ORDER — CHLORHEXIDINE GLUCONATE CLOTH 2 % EX PADS
6.0000 | MEDICATED_PAD | Freq: Every day | CUTANEOUS | Status: DC
  Administered 2020-04-02: 6 via TOPICAL

## 2020-04-01 MED ORDER — HEPARIN SODIUM (PORCINE) 1000 UNIT/ML IJ SOLN
INTRAMUSCULAR | Status: AC
  Filled 2020-04-01: qty 1

## 2020-04-01 MED ORDER — SODIUM CHLORIDE 0.9% FLUSH: 3.0000 mL | INTRAVENOUS | Status: DC | PRN

## 2020-04-01 MED ORDER — CHLORHEXIDINE GLUCONATE 0.12 % MT SOLN
15.0000 mL | Freq: Once | OROMUCOSAL | Status: AC
  Filled 2020-04-01: qty 15

## 2020-04-01 MED ORDER — ASPIRIN EC 81 MG PO TBEC
81.0000 mg | DELAYED_RELEASE_TABLET | Freq: Every day | ORAL | Status: DC
  Administered 2020-04-01 – 2020-04-02 (×2): 81 mg via ORAL
  Filled 2020-04-01 (×2): qty 1

## 2020-04-01 MED ORDER — PROTAMINE SULFATE 10 MG/ML IV SOLN
INTRAVENOUS | Status: DC | PRN
Start: 2020-04-01 — End: 2020-04-01
  Administered 2020-04-01: 150 mg via INTRAVENOUS

## 2020-04-01 MED ORDER — UMECLIDINIUM BROMIDE 62.5 MCG/INH IN AEPB
1.0000 | INHALATION_SPRAY | Freq: Every day | RESPIRATORY_TRACT | Status: DC
  Administered 2020-04-02: 1 via RESPIRATORY_TRACT
  Filled 2020-04-01: qty 7

## 2020-04-01 MED ORDER — ONDANSETRON HCL 4 MG/2ML IJ SOLN
INTRAMUSCULAR | Status: DC | PRN
  Administered 2020-04-01: 4 mg via INTRAVENOUS

## 2020-04-01 MED ORDER — PHENYLEPHRINE HCL-NACL 20-0.9 MG/250ML-% IV SOLN
0.0000 ug/min | INTRAVENOUS | Status: DC
  Filled 2020-04-01: qty 250

## 2020-04-01 MED ORDER — VERAPAMIL HCL 2.5 MG/ML IV SOLN
INTRAVENOUS | Status: AC
  Filled 2020-04-01: qty 2

## 2020-04-01 MED ORDER — GABAPENTIN 100 MG PO CAPS
100.0000 mg | ORAL_CAPSULE | Freq: Every day | ORAL | Status: DC
  Administered 2020-04-01: 200 mg via ORAL
  Filled 2020-04-01: qty 2

## 2020-04-01 MED ORDER — ATORVASTATIN CALCIUM 80 MG PO TABS
80.0000 mg | ORAL_TABLET | Freq: Every day | ORAL | Status: DC
  Administered 2020-04-01: 80 mg via ORAL
  Filled 2020-04-01: qty 1

## 2020-04-01 MED ORDER — PROPOFOL 500 MG/50ML IV EMUL
INTRAVENOUS | Status: DC | PRN
  Administered 2020-04-01: 10 ug/kg/min via INTRAVENOUS

## 2020-04-01 MED ORDER — INSULIN ASPART 100 UNIT/ML ~~LOC~~ SOLN
0.0000 [IU] | Freq: Three times a day (TID) | SUBCUTANEOUS | Status: DC
  Administered 2020-04-01 (×2): 4 [IU] via SUBCUTANEOUS
  Administered 2020-04-02: 12 [IU] via SUBCUTANEOUS
  Administered 2020-04-02: 2 [IU] via SUBCUTANEOUS

## 2020-04-01 MED ORDER — HEPARIN (PORCINE) IN NACL 1000-0.9 UT/500ML-% IV SOLN
INTRAVENOUS | Status: DC | PRN
  Administered 2020-04-01 (×3): 500 mL

## 2020-04-01 MED ORDER — SODIUM CHLORIDE 0.9 % IV SOLN: INTRAVENOUS | Status: AC

## 2020-04-01 MED ORDER — ACETAMINOPHEN 500 MG PO TABS
1000.0000 mg | ORAL_TABLET | Freq: Once | ORAL | Status: AC
  Administered 2020-04-01: 1000 mg via ORAL
  Filled 2020-04-01 (×2): qty 2

## 2020-04-01 MED ORDER — MIDAZOLAM HCL 5 MG/5ML IJ SOLN
INTRAMUSCULAR | Status: DC | PRN
  Administered 2020-04-01: 1 mg via INTRAVENOUS

## 2020-04-01 MED ORDER — SODIUM CHLORIDE 0.9 % IV SOLN: 250.0000 mL | INTRAVENOUS | Status: DC | PRN

## 2020-04-01 MED ORDER — FENTANYL CITRATE (PF) 100 MCG/2ML IJ SOLN
INTRAMUSCULAR | Status: AC
  Filled 2020-04-01: qty 2

## 2020-04-01 MED ORDER — CHLORHEXIDINE GLUCONATE 4 % EX LIQD
30.0000 mL | CUTANEOUS | Status: DC
  Filled 2020-04-01: qty 30

## 2020-04-01 MED ORDER — IOHEXOL 350 MG/ML SOLN
INTRAVENOUS | Status: AC
  Filled 2020-04-01: qty 1

## 2020-04-01 MED ORDER — FENTANYL CITRATE (PF) 100 MCG/2ML IJ SOLN
INTRAMUSCULAR | Status: DC | PRN
  Administered 2020-04-01: 50 ug via INTRAVENOUS

## 2020-04-01 MED ORDER — CHLORHEXIDINE GLUCONATE 4 % EX LIQD
60.0000 mL | Freq: Once | CUTANEOUS | Status: DC
  Filled 2020-04-01: qty 60

## 2020-04-01 MED ORDER — SODIUM CHLORIDE 0.9 % IV SOLN
1.5000 g | Freq: Two times a day (BID) | INTRAVENOUS | Status: DC
  Administered 2020-04-01 – 2020-04-02 (×2): 1.5 g via INTRAVENOUS
  Filled 2020-04-01 (×3): qty 1.5

## 2020-04-01 MED ORDER — NITROGLYCERIN IN D5W 200-5 MCG/ML-% IV SOLN: 0.0000 ug/min | INTRAVENOUS | Status: DC

## 2020-04-01 MED ORDER — LIDOCAINE HCL (PF) 1 % IJ SOLN
INTRAMUSCULAR | Status: DC | PRN
  Administered 2020-04-01 (×2): 10 mL

## 2020-04-01 MED ORDER — FLUTICASONE FUROATE-VILANTEROL 100-25 MCG/INH IN AEPB
1.0000 | INHALATION_SPRAY | Freq: Every day | RESPIRATORY_TRACT | Status: DC
  Administered 2020-04-02: 1 via RESPIRATORY_TRACT
  Filled 2020-04-01: qty 28

## 2020-04-01 SURGICAL SUPPLY — 33 items
BAG SNAP BAND KOVER 36X36 (MISCELLANEOUS) ×4 IMPLANT
BLANKET WARM UNDERBOD FULL ACC (MISCELLANEOUS) ×2 IMPLANT
CABLE ADAPT PACING TEMP 12FT (ADAPTER) ×2 IMPLANT
CATH 29 EDWARDS DELIVERY SYS (CATHETERS) ×2 IMPLANT
CATH DIAG 6FR PIGTAIL ANGLED (CATHETERS) ×4 IMPLANT
CATH INFINITI 6F AL2 (CATHETERS) ×2 IMPLANT
CATH S G BIP PACING (CATHETERS) ×2 IMPLANT
CLOSURE MYNX CONTROL 6F/7F (Vascular Products) ×2 IMPLANT
CRIMPER (MISCELLANEOUS) ×2 IMPLANT
DEVICE CLOSURE PERCLS PRGLD 6F (VASCULAR PRODUCTS) ×2 IMPLANT
DEVICE INFLATION ATRION QL38 (MISCELLANEOUS) ×2 IMPLANT
GUIDEWIRE SAFE TJ AMPLATZ EXST (WIRE) ×2 IMPLANT
KIT HEART LEFT (KITS) ×2 IMPLANT
KIT MICROPUNCTURE NIT STIFF (SHEATH) ×2 IMPLANT
PACK CARDIAC CATHETERIZATION (CUSTOM PROCEDURE TRAY) ×2 IMPLANT
PERCLOSE PROGLIDE 6F (VASCULAR PRODUCTS) ×4
SHEATH 16X36 EDWARDS (SHEATH) ×2 IMPLANT
SHEATH BRITE TIP 7FR 35CM (SHEATH) ×2 IMPLANT
SHEATH PINNACLE 6F 10CM (SHEATH) ×2 IMPLANT
SHEATH PINNACLE 8F 10CM (SHEATH) ×2 IMPLANT
SHEATH PROBE COVER 6X72 (BAG) ×2 IMPLANT
SHIELD RADPAD SCOOP 12X17 (MISCELLANEOUS) ×2 IMPLANT
SLEEVE REPOSITIONING LENGTH 30 (MISCELLANEOUS) ×2 IMPLANT
STOPCOCK MORSE 400PSI 3WAY (MISCELLANEOUS) ×4 IMPLANT
SYR MEDRAD MARK V 150ML (SYRINGE) ×2 IMPLANT
TRANSDUCER W/STOPCOCK (MISCELLANEOUS) ×4 IMPLANT
TUBE CONN 8.8X1320 FR HP M-F (CONNECTOR) ×2 IMPLANT
TUBING CIL FLEX 10 FLL-RA (TUBING) ×2 IMPLANT
VALVE HEART TRANSCATH SZ3 29MM (Valve) ×2 IMPLANT
WIRE AMPLATZ SS-J .035X180CM (WIRE) ×2 IMPLANT
WIRE EMERALD 3MM-J .035X150CM (WIRE) ×2 IMPLANT
WIRE EMERALD 3MM-J .035X260CM (WIRE) ×2 IMPLANT
WIRE EMERALD ST .035X260CM (WIRE) ×2 IMPLANT

## 2020-04-01 NOTE — Anesthesia Procedure Notes (Signed)
Arterial Line Insertion Start/End6/29/2021 8:25 AM, 04/01/2020 8:30 AM Performed by: Inda Coke, CRNA, CRNA  Preanesthetic checklist: patient identified, IV checked, site marked, risks and benefits discussed, surgical consent, monitors and equipment checked, pre-op evaluation, timeout performed and anesthesia consent Lidocaine 1% used for infiltration and patient sedated Right, radial was placed Catheter size: 20 G Hand hygiene performed  and maximum sterile barriers used  Allen's test indicative of satisfactory collateral circulation Attempts: 1 Procedure performed without using ultrasound guided technique. Following insertion, dressing applied and Biopatch. Post procedure assessment: normal  Patient tolerated the procedure well with no immediate complications.

## 2020-04-01 NOTE — Progress Notes (Signed)
Mobility Specialist - Progress Note   04/01/20 1712  Mobility  Activity Ambulated in hall  Level of Assistance Modified independent, requires aide device or extra time  Assistive Device Front wheel walker  Distance Ambulated (ft) 20 ft  Mobility Response Tolerated well  Mobility performed by Mobility specialist  $Mobility charge 1 Mobility   Did not obtain sets of vitals as pt was already getting up to use the restroom. He declined any further ambulation.  Pricilla Handler Mobility Specialist Mobility Specialist Phone: [phone]

## 2020-04-01 NOTE — Progress Notes (Signed)
Pt has number for transport home when ready to be d/c'd in his wallet: [phone]

## 2020-04-01 NOTE — Op Note (Signed)
[facility]   MULTIDISCIPLINARY HEART VALVE TEAM   TAVR OPERATIVE NOTE   Date of Procedure:  04/01/2020  Preoperative Diagnosis: Severe Aortic Stenosis   Postoperative Diagnosis: Same   Procedure:    Transcatheter Aortic Valve Replacement - Percutaneous Right Transfemoral Approach  Edwards Sapien 3 THV (size 29 mm, model # 9600TFX, serial # [id number])   Co-Surgeons:  Lauree Chandler, MD and Valentina Gu. Roxy Manns, MD   Anesthesiologist:  Arabella Merles, MD  Echocardiographer:  Sanda Klein, MD  Pre-operative Echo Findings:  Severe aortic stenosis  Normal left ventricular systolic function  Post-operative Echo Findings:  No paravalvular leak  Normal left ventricular systolic function   BRIEF CLINICAL NOTE AND INDICATIONS FOR SURGERY  Patient is a 74 year old male with history of aortic stenosis, chronic diastolic congestive heart failure with preserved ejection fraction, longstanding tobacco abuse with COPD, type 2 diabetes mellitus without complications, hypertension, left lower lobe pulmonary nodule suspicious for bronchogenic neoplasm, obstructive sleep apnea on CPAP, obesity, and intraductal papillary mucinous neoplasm of the pancreas status post Whipple procedure in 2019 who has been referred for surgical consultation to discuss treatment options for management of severe symptomatic aortic stenosis.  The patient has been followed by Dr. Rockey Situ intermittently ever since.  Patient was first evaluated by a cardiologist in 2018 for symptoms of exertional shortness of breath.  Stress test was felt to be low risk for coronary ischemia and echocardiogram revealed normal left ventricular systolic function with moderate aortic stenosis.  Symptoms were attributed to heart failure with preserved ejection fraction and COPD.    Patient underwent Whipple procedure in October 2019 at [facility] for intraductal papillary mucinous neoplasm of  the pancreas.  The common and proper hepatic arteries were found to be dissected and occluded during surgery and ultimately the patient underwent stent placement in the gastroduodenal artery and hepatic arteries.  He was started on Plavix at that time.  The patient underwent follow-up echocardiogram Feb 07, 2020 revealing significant progression in the severity of aortic stenosis with preserved left ventricular systolic function.  Peak velocity across aortic valve was measured 4.1 m/s corresponding to mean transvalvular gradient estimated 37 mmHg.  DVI was reported 0.23 and aortic valve area calculated 0.85 cm by VTI.  Left ventricular systolic function remain preserved with ejection fraction estimated 65%.  Stroke-volume index was reported 35.  The patient was referred to the multidisciplinary heart valve clinic for management.  Shortly after that the patient was hospitalized briefly at [facility] with abdominal pain, elevated white blood count, and mild lactic acidosis.  CT scan of the abdomen and pelvis revealed "considerable peripancreatic and duodenal inflammatory change consistent with some acute pancreatitis or duodenitis.  The patient was treated with ciprofloxacin and Flagyl.  Symptoms resolved.  During his hospitalization he was evaluated by cardiology and he underwent diagnostic cardiac catheterization by Dr. Rockey Situ on Feb 21, 2020.  Catheterization revealed nonobstructive coronary artery disease with normal left ventricular systolic function and severe aortic stenosis.  Mean transvalvular gradient across the aortic valve was measured 45 mmHg corresponding to valve area calculated 0.95 cm.  There was very mild elevation of right-sided pressures.  Patient was seen in consultation by Dr. Angelena Form and CT angiography was performed.  CT scan of the chest revealed small pulmonary nodule in the left lower lobe suspicious for bronchogenic neoplasm.  Cardiothoracic surgical consultation was requested.  During the  course of the patient's preoperative work up they have been evaluated comprehensively by a  multidisciplinary team of specialists coordinated through the [facility] the [facility].  They have been demonstrated to suffer from symptomatic severe aortic stenosis as noted above. The patient has been counseled extensively as to the relative risks and benefits of all options for the treatment of severe aortic stenosis including long term medical therapy, conventional surgery for aortic valve replacement, and transcatheter aortic valve replacement.  All questions have been answered, and the patient provides full informed consent for the operation as described.   DETAILS OF THE OPERATIVE PROCEDURE  PREPARATION:    The patient is brought to the operating room on the above mentioned date and appropriate monitoring was established by the anesthesia team. The patient is placed in the supine position on the operating table.  Intravenous antibiotics are administered. The patient is monitored closely throughout the procedure under conscious sedation.    Baseline transthoracic echocardiogram was performed. The patient's chest, abdomen, both groins, and both lower extremities are prepared and draped in a sterile manner. A time out procedure is performed.   PERIPHERAL ACCESS:    Using the modified Seldinger technique, femoral arterial and venous access was obtained with placement of 6 Fr sheaths on the left side.  A pigtail diagnostic catheter was passed through the left arterial sheath under fluoroscopic guidance into the aortic root.  A temporary transvenous pacemaker catheter was passed through the left femoral venous sheath under fluoroscopic guidance into the right ventricle.  The pacemaker was tested to ensure stable lead placement and pacemaker capture. Aortic root angiography was performed in order to determine the optimal angiographic angle for valve  deployment.   TRANSFEMORAL ACCESS:   Percutaneous transfemoral access and sheath placement was performed using ultrasound guidance.  The right common femoral artery was cannulated using a micropuncture needle and appropriate location was verified using hand injection angiogram.  A pair of Abbott Perclose percutaneous closure devices were placed and a 6 French sheath replaced into the femoral artery.  The patient was heparinized systemically and ACT verified > 250 seconds.    A 16 Fr transfemoral E-sheath was introduced into the right common femoral artery after progressively dilating over an Amplatz superstiff wire. An AL-2 catheter was used to direct a straight-tip exchange length wire across the native aortic valve into the left ventricle. This was exchanged out for a pigtail catheter and position was confirmed in the LV apex. Simultaneous LV and Ao pressures were recorded.  The pigtail catheter was exchanged for an Amplatz Extra-stiff wire in the LV apex.  Echocardiography was utilized to confirm appropriate wire position and no sign of entanglement in the mitral subvalvular apparatus.   TRANSCATHETER HEART VALVE DEPLOYMENT:   An Edwards Sapien 3 transcatheter heart valve (size 29 mm, model #9600TFX, serial #[id number]) was prepared and crimped per manufacturer's guidelines, and the proper orientation of the valve is confirmed on the [employer] delivery system. The valve was advanced through the introducer sheath using normal technique until in an appropriate position in the abdominal aorta beyond the sheath tip. The balloon was then retracted and using the fine-tuning wheel was centered on the valve. The valve was then advanced across the aortic arch using appropriate flexion of the catheter. The valve was carefully positioned across the aortic valve annulus. The Commander catheter was retracted using normal technique. Once final position of the valve has been confirmed by angiographic  assessment, the valve is deployed while temporarily holding ventilation and during rapid ventricular pacing to  maintain systolic blood pressure < 50 mmHg and pulse pressure < 10 mmHg. The balloon inflation is held for >3 seconds after reaching full deployment volume. Once the balloon has fully deflated the balloon is retracted into the ascending aorta and valve function is assessed using echocardiography. There is felt to be no paravalvular leak and no central aortic insufficiency.  The patient's hemodynamic recovery following valve deployment is good.  The deployment balloon and guidewire are both removed.    PROCEDURE COMPLETION:   The sheath was removed and femoral artery closure performed.  Protamine was administered once femoral arterial repair was complete. The temporary pacemaker, pigtail catheters and femoral sheaths were removed with manual pressure used for hemostasis.  A Mynx femoral closure device was utilized following removal of the diagnostic sheath in the left femoral artery.  The patient tolerated the procedure well and is transported to the surgical intensive care in stable condition. There were no immediate intraoperative complications. All sponge instrument and needle counts are verified correct at completion of the operation.   No blood products were administered during the operation.  The patient received a total of 40 mL of intravenous contrast during the procedure.   Rexene Alberts, MD 04/01/2020 10:57 AM

## 2020-04-01 NOTE — Discharge Instructions (Signed)
ACTIVITY AND EXERCISE °• Daily activity and exercise are an important part of your recovery. People recover at different rates depending on their general health and type of valve procedure. °• Most people recovering from TAVR feel better relatively quickly  °• No lifting, pushing, pulling more than 10 pounds (examples to avoid: groceries, vacuuming, gardening, golfing): °            - For one week with a procedure through the groin. °            - For six weeks for procedures through the chest wall or neck. °NOTE: You will typically see one of our providers 7-14 days after your procedure to discuss WHEN TO RESUME the above activities.  °  °  °DRIVING °• Do not drive until you are seen for follow up and cleared by a provider. Generally, we ask patient to not drive for 1 week after their procedure. °• If you have been told by your doctor in the past that you may not drive, you must talk with him/her before you begin driving again. °  °DRESSING °• Groin site: you may leave the clear dressing over the site for up to one week or until it falls off. °  °HYGIENE °• If you had a femoral (leg) procedure, you may take a shower when you return home. After the shower, pat the site dry. Do NOT use powder, oils or lotions in your groin area until the site has completely healed. °• If you had a chest procedure, you may shower when you return home unless specifically instructed not to by your discharging practitioner. °            - DO NOT scrub incision; pat dry with a towel. °            - DO NOT apply any lotions, oils, powders to the incision. °            - No tub baths / swimming for at least 2 weeks. °• If you notice any fevers, chills, increased pain, swelling, bleeding or pus, please contact your doctor. °  °ADDITIONAL INFORMATION °• If you are going to have an upcoming dental procedure, please contact our office as you will require antibiotics ahead of time to prevent infection on your heart valve.  ° ° °If you have any  questions or concerns you can call the structural heart phone during normal business hours 8am-4pm. If you have an urgent need after hours or weekends please call [phone] to talk to the on call provider for general cardiology. If you have an emergency that requires immediate attention, please call 911.  ° ° °After TAVR Checklist ° °Check  Test Description  ° Follow up appointment in 1-2 weeks  You will see our structural heart physician assistant, Nathaniel Little. Your incision sites will be checked and you will be cleared to drive and resume all normal activities if you are doing well.    ° 1 month echo and follow up  You will have an echo to check on your new heart valve and be seen back in the office by Nathaniel Little. Many times the echo is not read by your appointment time, but Nathaniel will call you later that day or the following day to report your results.  ° Follow up with your primary cardiologist You will need to be seen by your primary cardiologist in the following 3-6 months after your 1 month appointment in the valve   clinic. Often times your Plavix or Aspirin will be discontinued during this time, but this is decided on a case by case basis.   ° 1 year echo and follow up You will have another echo to check on your heart valve after 1 year and be seen back in the office by Nathaniel Little. This your last structural heart visit.  ° Bacterial endocarditis prophylaxis  You will have to take antibiotics for the rest of your life before all dental procedures (even teeth cleanings) to protect your heart valve. Antibiotics are also required before some surgeries. Please check with your cardiologist before scheduling any surgeries. Also, please make sure to tell us if you have a penicillin allergy as you will require an alternative antibiotic.   ° ° °

## 2020-04-01 NOTE — Progress Notes (Signed)
  Echocardiogram 2D Echocardiogram has been performed.  [name] 04/01/2020, 10:51 AM

## 2020-04-01 NOTE — Anesthesia Procedure Notes (Signed)
Procedure Name: MAC Date/Time: 04/01/2020 9:26 AM Performed by: Inda Coke, CRNA Pre-anesthesia Checklist: Patient identified, Emergency Drugs available, Suction available, Timeout performed and Patient being monitored Patient Re-evaluated:Patient Re-evaluated prior to induction Oxygen Delivery Method: Simple face mask Induction Type: IV induction Dental Injury: Teeth and Oropharynx as per pre-operative assessment

## 2020-04-01 NOTE — Progress Notes (Signed)
Pt received from cath lab to 4e13. Right and left groin soft, level 0. Pt oriented to room and call bell. CHG bath complete. Tele box connected to pt and CCMD called. VSS. Call bell in reach. Will continue to monitor.  Arletta Bale, RN

## 2020-04-01 NOTE — Transfer of Care (Signed)
Immediate Anesthesia Transfer of Care Note  Patient: Nathaniel Little  Procedure(s) Performed: TRANSCATHETER AORTIC VALVE REPLACEMENT, TRANSFEMORAL (N/A ) TRANSESOPHAGEAL ECHOCARDIOGRAM (TEE) (N/A )  Patient Location: PACU and Cath Lab  Anesthesia Type:MAC  Level of Consciousness: awake and drowsy  Airway & Oxygen Therapy: Patient Spontanous Breathing and Patient connected to face mask oxygen  Post-op Assessment: Report given to RN and Post -op Vital signs reviewed and stable  Post vital signs: Reviewed and stable  Last Vitals:  Vitals Value Taken Time  BP 113/49 04/01/20 1117  Temp    Pulse 57 04/01/20 1117  Resp 14 04/01/20 1117  SpO2 94 % 04/01/20 1117  Vitals shown include unvalidated device data.  Last Pain:  Vitals:   04/01/20 1117  PainSc: 0-No pain         Complications: No complications documented.

## 2020-04-01 NOTE — Interval H&P Note (Signed)
History and Physical Interval Note:  04/01/2020 9:16 AM  Nathaniel Little  has presented today for surgery, with the diagnosis of Severe Aortic Stenosis.  The various methods of treatment have been discussed with the patient and family. After consideration of risks, benefits and other options for treatment, the patient has consented to  Procedure(s): TRANSCATHETER AORTIC VALVE REPLACEMENT, TRANSFEMORAL (N/A) TRANSESOPHAGEAL ECHOCARDIOGRAM (TEE) (N/A) as a surgical intervention.  The patient's history has been reviewed, patient examined, no change in status, stable for surgery.  I have reviewed the patient's chart and labs.  Questions were answered to the patient's satisfaction.     Rexene Alberts

## 2020-04-01 NOTE — Progress Notes (Signed)
  [facility] VALVE TEAM  Patient doing well s/p TAVR. He is hemodynamically stable. Groin sites stable. ECG with sinus with 1st deg AV block (old) and no high grade block. Arterial line discontinued and transferring up to 4E. Central line in- will get follow up CXR. Plan for early ambulation after bedrest completed and hopeful discharge over the next 24-48 hours.   Angelena Form PA-C  [facility]  Pager [phone]

## 2020-04-01 NOTE — Anesthesia Procedure Notes (Signed)
Central Venous Catheter Insertion Performed by: Roderic Palau, MD, anesthesiologist Start/End6/29/2021 8:55 AM, 04/01/2020 9:05 AM Patient location: Pre-op. Preanesthetic checklist: patient identified, IV checked, site marked, risks and benefits discussed, surgical consent, monitors and equipment checked, pre-op evaluation, timeout performed and anesthesia consent Position: Trendelenburg Lidocaine 1% used for infiltration and patient sedated Hand hygiene performed , maximum sterile barriers used  and Seldinger technique used Catheter size: 8 Fr Total catheter length 16. Central line was placed.Double lumen Procedure performed using ultrasound guided technique. Ultrasound Notes:anatomy identified, needle tip was noted to be adjacent to the nerve/plexus identified, no ultrasound evidence of intravascular and/or intraneural injection and image(s) printed for medical record Attempts: 1 Following insertion, dressing applied, line sutured and Biopatch. Post procedure assessment: blood return through all ports  Patient tolerated the procedure well with no immediate complications.

## 2020-04-01 NOTE — CV Procedure (Signed)
[facility]  TAVR OPERATIVE NOTE   Date of Procedure:  04/01/2020  Preoperative Diagnosis: Severe Aortic Stenosis   Postoperative Diagnosis: Same   Procedure:    Transcatheter Aortic Valve Replacement - Transfemoral Approach  Edwards Sapien 3 THV (size 29 mm, model # [id number], serial # [id number])   Co-Surgeons:  Lauree Chandler, MD and Valentina Gu. Roxy Manns, MD  Anesthesiologist:  Ola Spurr  Echocardiographer:  Croitoru  Pre-operative Echo Findings:  Severe aortic stenosis  Normal left ventricular systolic function  Post-operative Echo Findings:  No paravalvular leak  Normal left ventricular systolic function  BRIEF CLINICAL NOTE AND INDICATIONS FOR SURGERY  74 yo male with history of arthritis, COPD, depression, DM, HTN, lymphedema, morbid obesity, prostate cancer, pancreatic mass s/p Whipple in 2019, sleep apnea, tobacco abuse and severe aortic stenosis who is here today for TAVR. Echo 02/07/20 with LVEF=60-65%, mild LVH. The aortic valve leaflets are thickened and calcified with limited leaflet excursion. Mean gradient 37 mmHg, peak gradient 68 mmHg. AVA 0.82 cm2. Cardiac cath 02/21/20 with no CAD. Mean gradient 44 mmHg by cath. He underwent Whipple procedure 07/2018 at [facility] for IPMN. 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 He was started on Plavix. He has had progressive dyspnea and fatigue.   During the course of the patient's preoperative work up they have been evaluated comprehensively by a multidisciplinary team of specialists coordinated through the [facility] in the [facility].  They have been demonstrated to suffer from symptomatic severe aortic stenosis as noted above. The patient has been counseled extensively as to the relative risks and benefits of all options for the treatment of severe aortic stenosis including long  term medical therapy, conventional surgery for aortic valve replacement, and transcatheter aortic valve replacement.  The patient has been independently evaluated by Dr. Roxy Manns with CT surgery and they are felt to be at high risk for conventional surgical aortic valve replacement. The surgeon indicated the patient would be a poor candidate for conventional surgery. Based upon review of all of the patient's preoperative diagnostic tests they are felt to be candidate for transcatheter aortic valve replacement using the transfemoral approach as an alternative to high risk conventional surgery.    Following the decision to proceed with transcatheter aortic valve replacement, a discussion has been held regarding what types of management strategies would be attempted intraoperatively in the event of life-threatening complications, including whether or not the patient would be considered a candidate for the use of cardiopulmonary bypass and/or conversion to open sternotomy for attempted surgical intervention.  The patient has been advised of a variety of complications that might develop peculiar to this approach including but not limited to risks of death, stroke, paravalvular leak, aortic dissection or other major vascular complications, aortic annulus rupture, device embolization, cardiac rupture or perforation, acute myocardial infarction, arrhythmia, heart block or bradycardia requiring permanent pacemaker placement, congestive heart failure, respiratory failure, renal failure, pneumonia, infection, other late complications related to structural valve deterioration or migration, or other complications that might ultimately cause a temporary or permanent loss of functional independence or other long term morbidity.  The patient provides full informed consent for the procedure as described and all questions were answered preoperatively.    DETAILS OF THE OPERATIVE PROCEDURE  PREPARATION:   The patient is brought  to the operating room on the above mentioned date and central monitoring was established  by the anesthesia team including placement of a radial arterial line. The patient is placed in the supine position on the operating table.  Intravenous antibiotics are administered. Conscious sedation is used.   Baseline transthoracic echocardiogram was performed. The patient's chest, abdomen, both groins, and both lower extremities are prepared and draped in a sterile manner. A time out procedure is performed.   PERIPHERAL ACCESS:   Using the modified Seldinger technique, femoral arterial and venous access were obtained with placement of 6 Fr sheaths on the left side using u/s guidance.  A pigtail diagnostic catheter was passed through the femoral arterial sheath under fluoroscopic guidance into the aortic root.  A temporary transvenous pacemaker catheter was passed through the femoral venous sheath under fluoroscopic guidance into the right ventricle.  The pacemaker was tested to ensure stable lead placement and pacemaker capture. Aortic root angiography was performed in order to determine the optimal angiographic angle for valve deployment.  TRANSFEMORAL ACCESS:  A micropuncture kit was used to gain access to the right femoral artery using u/s guidance. Position confirmed with angiography. Pre-closure with double ProGlide closure devices. The patient was heparinized systemically and ACT verified > 250 seconds.    A 16 Fr transfemoral E-sheath was introduced into the right femoral artery after progressively dilating over an Amplatz superstiff wire. An AL-2 catheter was used to direct a straight-tip exchange length wire across the native aortic valve into the left ventricle. This was exchanged out for a pigtail catheter and position was confirmed in the LV apex. Simultaneous LV and Ao pressures were recorded.  The pigtail catheter was then exchanged for an Amplatz Extra-stiff wire in the LV apex.   TRANSCATHETER  HEART VALVE DEPLOYMENT:  An Edwards Sapien 3 THV (size 29 mm) was prepared and crimped per manufacturer's guidelines, and the proper orientation of the valve is confirmed on the [employer] delivery system. The valve was advanced through the introducer sheath using normal technique until in an appropriate position in the abdominal aorta beyond the sheath tip. The balloon was then retracted and using the fine-tuning wheel was centered on the valve. The valve was then advanced across the aortic arch using appropriate flexion of the catheter. The valve was carefully positioned across the aortic valve annulus. The Commander catheter was retracted using normal technique. Once final position of the valve has been confirmed by angiographic assessment, the valve is deployed while temporarily holding ventilation and during rapid ventricular pacing to maintain systolic blood pressure < 50 mmHg and pulse pressure < 10 mmHg. The balloon inflation is held for >3 seconds after reaching full deployment volume. Once the balloon has fully deflated the balloon is retracted into the ascending aorta and valve function is assessed using TTE. There is felt to be no paravalvular leak and no central aortic insufficiency.  The patient's hemodynamic recovery following valve deployment is good.  The deployment balloon and guidewire are both removed. Echo demostrated acceptable post-procedural gradients, stable mitral valve function, and no AI.   PROCEDURE COMPLETION:  The sheath was then removed and closure devices were completed. Protamine was administered once femoral arterial repair was complete. The temporary pacemaker, pigtail catheters and femoral sheaths were removed with a Mynx closure device placed in the right femoral artery and manual pressure used for venous hemostasis.   The patient tolerated the procedure well and is transported to the surgical intensive care in stable condition. There were no immediate  intraoperative complications. All sponge instrument and needle counts are verified correct  at completion of the operation.   No blood products were administered during the operation.  The patient received a total of 60 mL of intravenous contrast during the procedure.  Lauree Chandler MD 04/01/2020 11:01 AM

## 2020-04-01 NOTE — Anesthesia Preprocedure Evaluation (Signed)
Anesthesia Evaluation  Patient identified by MRN, date of birth, ID band Patient awake    Reviewed: Allergy & Precautions, H&P , NPO status , Patient's Chart, lab work & pertinent test results, reviewed documented beta blocker date and time   Airway Mallampati: III  TM Distance: >3 FB Neck ROM: Full    Dental no notable dental hx. (+) Edentulous Upper, Edentulous Lower, Dental Advisory Given   Pulmonary sleep apnea and Continuous Positive Airway Pressure Ventilation , COPD,  COPD inhaler, Current SmokerPatient did not abstain from smoking.,    Pulmonary exam normal breath sounds clear to auscultation       Cardiovascular hypertension, Pt. on medications and Pt. on home beta blockers +CHF  + Valvular Problems/Murmurs AS  Rhythm:Regular Rate:Normal + Systolic murmurs    Neuro/Psych Depression negative neurological ROS     GI/Hepatic Neg liver ROS, GERD  Medicated,  Endo/Other  diabetes, Insulin Dependent, Oral Hypoglycemic Agents  Renal/GU negative Renal ROS  negative genitourinary   Musculoskeletal  (+) Arthritis , Osteoarthritis,    Abdominal   Peds  Hematology negative hematology ROS (+)   Anesthesia Other Findings   Reproductive/Obstetrics negative OB ROS                             Anesthesia Physical Anesthesia Plan  ASA: IV  Anesthesia Plan: MAC   Post-op Pain Management:    Induction: Intravenous  PONV Risk Score and Plan: 1 and Propofol infusion and Ondansetron  Airway Management Planned: Simple Face Mask  Additional Equipment: Arterial line  Intra-op Plan:   Post-operative Plan:   Informed Consent: I have reviewed the patients History and Physical, chart, labs and discussed the procedure including the risks, benefits and alternatives for the proposed anesthesia with the patient or authorized representative who has indicated his/her understanding and acceptance.      Dental advisory given  Plan Discussed with: CRNA  Anesthesia Plan Comments:         Anesthesia Quick Evaluation

## 2020-04-01 NOTE — Anesthesia Postprocedure Evaluation (Signed)
Anesthesia Post Note  Patient: Nathaniel Little  Procedure(s) Performed: TRANSCATHETER AORTIC VALVE REPLACEMENT, TRANSFEMORAL (N/A ) TRANSESOPHAGEAL ECHOCARDIOGRAM (TEE) (N/A )     Patient location during evaluation: Cath Lab Anesthesia Type: MAC Level of consciousness: awake and alert Pain management: pain level controlled Vital Signs Assessment: post-procedure vital signs reviewed and stable Respiratory status: spontaneous breathing, nonlabored ventilation and respiratory function stable Cardiovascular status: stable and blood pressure returned to baseline Postop Assessment: no apparent nausea or vomiting Anesthetic complications: no   No complications documented.  Last Vitals:  Vitals:   04/01/20 1317 04/01/20 1330  BP: 128/69 (!) 146/90  Pulse: (!) 58 61  Resp: 16 17  Temp: 36.4 C 36.4 C  SpO2: 96% 96%    Last Pain:  Vitals:   04/01/20 1330  TempSrc: Oral  PainSc:                  Rashaad Hallstrom,W. EDMOND

## 2020-04-02 ENCOUNTER — Encounter (HOSPITAL_COMMUNITY): Payer: Self-pay | Admitting: Cardiovascular Disease

## 2020-04-02 ENCOUNTER — Inpatient Hospital Stay (HOSPITAL_COMMUNITY): Payer: Medicare Other

## 2020-04-02 DIAGNOSIS — Z952 Presence of prosthetic heart valve: Secondary | ICD-10-CM

## 2020-04-02 DIAGNOSIS — I350 Nonrheumatic aortic (valve) stenosis: Principal | ICD-10-CM

## 2020-04-02 LAB — BASIC METABOLIC PANEL
Anion gap: 8 (ref 5–15)
BUN: 12 mg/dL (ref 8–23)
CO2: 25 mmol/L (ref 22–32)
Calcium: 8.6 mg/dL — ABNORMAL LOW (ref 8.9–10.3)
Chloride: 105 mmol/L (ref 98–111)
Creatinine, Ser: 0.91 mg/dL (ref 0.61–1.24)
GFR calc Af Amer: >60 mL/min (ref >60)
GFR calc non Af Amer: >60 mL/min (ref >60)
Glucose, Bld: 127 mg/dL — ABNORMAL HIGH (ref 70–99)
Potassium: 3.5 mmol/L (ref 3.5–5.1)
Sodium: 138 mmol/L (ref 135–145)

## 2020-04-02 LAB — CBC
HCT: 32.8 % — ABNORMAL LOW (ref 39.0–52.0)
Hemoglobin: 10.3 g/dL — ABNORMAL LOW (ref 13.0–17.0)
MCH: 25.2 pg — ABNORMAL LOW (ref 26.0–34.0)
MCHC: 31.4 g/dL (ref 30.0–36.0)
MCV: 80.2 fL (ref 80.0–100.0)
Platelets: 181 10*3/uL (ref 150–400)
RBC: 4.09 MIL/uL — ABNORMAL LOW (ref 4.22–5.81)
RDW: 18.2 % — ABNORMAL HIGH (ref 11.5–15.5)
WBC: 9.3 10*3/uL (ref 4.0–10.5)
nRBC: 0 % (ref 0.0–0.2)

## 2020-04-02 LAB — ECHOCARDIOGRAM LIMITED
Height: 69 in
Weight: 3393.32 oz

## 2020-04-02 LAB — GLUCOSE, CAPILLARY
Glucose-Capillary: 132 mg/dL — ABNORMAL HIGH (ref 70–99)
Glucose-Capillary: 254 mg/dL — ABNORMAL HIGH (ref 70–99)

## 2020-04-02 LAB — MAGNESIUM: Magnesium: 1.7 mg/dL (ref 1.7–2.4)

## 2020-04-02 MED FILL — Verapamil HCl IV Soln 2.5 MG/ML: INTRAVENOUS | Qty: 2 | Status: AC

## 2020-04-02 MED FILL — Midazolam HCl Inj 2 MG/2ML (Base Equivalent): INTRAMUSCULAR | Qty: 1 | Status: AC

## 2020-04-02 NOTE — Progress Notes (Signed)
Discharge instructions given to patient. IV removed, clean and intact. Medications and wound care reviewed. All questions answered. Pt escorted home with [facility] transportation.  Arletta Bale, RN

## 2020-04-02 NOTE — Progress Notes (Signed)
CARDIAC REHAB PHASE I   PRE:  Rate/Rhythm: 68 SR  BP:  Supine: 148/71  Sitting:   Standing:    SaO2: 93%RA  MODE:  Ambulation: 300 ft   POST:  Rate/Rhythm: 91 SR  BP:  Supine:   Sitting: 162/68  Standing:    SaO2: 96%RA 1103-1209 Pt walked 300 ft on RA with rolling walker to bathroom and then hallway. Tolerated well. Has walker at home. To recliner after walk. Gave pt diabetic diet and discussed carb counting. Encouraged walking as tolerated. Pt has been walking in house mainly. Encouraged smoking cessation and gave handout. Pt encouraged to call [phone] if needed. Pt stated he has tried different measures and has not been successful. Stated he is going to talk to Medical Doctor about measures that might help him quit. Not interested in CRP 2 at this time.   Graylon Good, RN BSN  04/02/2020 12:06 PM

## 2020-04-02 NOTE — Discharge Summary (Addendum)
[facility] VALVE TEAM  Discharge Summary    Patient ID: Nathaniel Little MRN: [id number]; DOB: 30-Sep-1946  Admit date: 04/01/2020 Discharge date: 04/02/2020  Primary Care Provider: Verl Bangs, FNP  Primary Cardiologist: Ida Rogue, MD / Dr. Angelena Form & Dr. Roxy Manns (TAVR)  Discharge Diagnoses    Principal Problem:   S/P TAVR (transcatheter aortic valve replacement) Active Problems:   Personal history of prostate cancer   Chronic obstructive pulmonary disease (HCC)   GERD (gastroesophageal reflux disease)   Essential hypertension   Hyperlipidemia   Severe aortic stenosis   Tobacco abuse   Incidental pulmonary nodule, greater than or equal to 95mm   (HFpEF) heart failure with preserved ejection fraction (HCC)   Diabetes mellitus without complication (HCC)   Allergies Allergies  Allergen Reactions  . Codeine Itching    Diagnostic Studies/Procedures    TAVR OPERATIVE NOTE     Date of Procedure:                04/01/2020   Preoperative Diagnosis:      Severe Aortic Stenosis    Postoperative Diagnosis:    Same    Procedure:         Transcatheter Aortic Valve Replacement - Percutaneous Right Transfemoral Approach             Edwards Sapien 3 THV (size 29 mm, model # 9600TFX, serial # [id number])              Co-Surgeons:                        Lauree Chandler, MD and Valentina Gu. Roxy Manns, MD    Anesthesiologist:                  Arabella Merles, MD   Echocardiographer:              Sanda Klein, MD   Pre-operative Echo Findings: ? Severe aortic stenosis ? Normal left ventricular systolic function   Post-operative Echo Findings: ? No paravalvular leak ? Normal left ventricular systolic function   _____________   Echo 04/02/20: complete but pending formal read at the time of discharge    History of Present Illness     Nathaniel Little is a 74 y.o. male with a history of chronic diastolic CHF, longstanding tobacco  abuse with COPD, type 2 diabetes mellitus without complications, HTN, left lower lobe pulmonary nodule suspicious for bronchogenic neoplasm, OSA on CPAP, obesity, and intraductal papillary mucinous neoplasm of the pancreas s/p Whipple procedure in 2019 and severe AS who presented to [facility] on 04/01/20 for planned TAVR.  Patient was first evaluated by a cardiologist in 2018 for symptoms of exertional shortness of breath.  Stress test was felt to be low risk for coronary ischemia and echocardiogram revealed normal left ventricular systolic function with moderate aortic stenosis.  Symptoms were attributed to heart failure with preserved ejection fraction and COPD.     Patient underwent Whipple procedure in October 2019 at [name] for intraductal papillary mucinous neoplasm of the pancreas.  The common and proper hepatic arteries were found to be dissected and occluded during surgery and ultimately the patient underwent stent placement in the gastroduodenal artery and hepatic arteries.  He was started on Plavix at that time.   The patient underwent follow-up echocardiogram Feb 07, 2020 revealing significant progression in the severity of aortic stenosis with preserved left ventricular systolic function.  Peak velocity across aortic valve was measured 4.1 m/s corresponding to mean transvalvular gradient estimated 37 mmHg.  DVI was reported 0.23 and aortic valve area calculated 0.85 cm by VTI. Left ventricular systolic function remain preserved with ejection fraction estimated 65%.  The patient was referred to the multidisciplinary heart valve clinic for management.  Shortly after that the patient was hospitalized briefly at [facility] with abdominal pain, elevated white blood count, and mild lactic acidosis.  CT scan of the abdomen and pelvis revealed "considerable peripancreatic and duodenal inflammatory change consistent with some acute pancreatitis or duodenitis.  The patient was treated with  ciprofloxacin and Flagyl.  Symptoms resolved.  During his hospitalization he was evaluated by cardiology and he underwent diagnostic cardiac catheterization by Dr. Rockey Situ on Feb 21, 2020.  Catheterization revealed nonobstructive coronary artery disease with normal left ventricular systolic function and severe aortic stenosis.  Mean transvalvular gradient across the aortic valve was measured 45 mmHg corresponding to valve area calculated 0.95 cm.  There was very mild elevation of right-sided pressures.  Patient was seen in consultation by Dr. Angelena Form and CT angiography was performed.  CT scan of the chest revealed small pulmonary nodule in the left lower lobe suspicious for bronchogenic neoplasm. Follow up PET scan on 03/17/20 showed mild hypermetabolism, raising concern for primary bronchogenic neoplasm. There were no findings suspicious for metastatic disease. 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   The patient has been evaluated by the multidisciplinary valve team and felt to have severe, symptomatic aortic stenosis and to be a suitable candidate for TAVR, which was set up for 04/01/20.     Hospital Course     Consultants: none  Severe AS: s/p successful TAVR with a 29 mm Edwards Sapien 3 THV via the TF approach on 04/01/20. Post operative echo pending formal read. Groin sites are stable. ECG with sinus and LAFB (old). There is no high grade heart block. He was resumed on home Asprin and plavix. Plan for discharge home today with close follow up in the office next week.   DMT2: treated with SSI while admitted. Resume home meds including Metformin (after 48 hours post contrast dye exposure).   HTN: resume home meds.   Pulmonary nodule concerning for primary bronchogenic carcinoma: will make outpaitnet referral to the multidisciplinary thoracic cancer team in the outpatient  setting.   Tobacco abuse: counseled on completed cessation.  _____________  Discharge Vitals Blood pressure 139/68, pulse 80, temperature 98.4 F (36.9 C), temperature source Oral, resp. rate 18, height 5\' 9"  (1.753 m), weight 96.2 kg, SpO2 95 %.  Filed Weights   04/01/20 0710 04/02/20 0427  Weight: 96.4 kg 96.2 kg    Labs & Radiologic Studies    CBC Recent Labs    04/01/20 1137 04/02/20 0626  WBC  --  9.3  HGB 10.2* 10.3*  HCT 30.0* 32.8*  MCV  --  80.2  PLT  --  673   Basic Metabolic Panel Recent Labs    04/01/20 1137 04/02/20 0626  NA 140 138  K 3.4* 3.5  CL 101 105  CO2  --  25  GLUCOSE 137* 127*  BUN 13 12  CREATININE 0.70 0.91  CALCIUM  --  8.6*  MG  --  1.7   Liver Function Tests No results for input(s): AST, ALT, ALKPHOS, BILITOT, PROT, ALBUMIN in the last 72 hours. No results for input(s):  LIPASE, AMYLASE in the last 72 hours. Cardiac Enzymes No results for input(s): CKTOTAL, CKMB, CKMBINDEX, TROPONINI in the last 72 hours. BNP Invalid input(s): POCBNP D-Dimer No results for input(s): DDIMER in the last 72 hours. Hemoglobin A1C No results for input(s): HGBA1C in the last 72 hours. Fasting Lipid Panel No results for input(s): CHOL, HDL, LDLCALC, TRIG, CHOLHDL, LDLDIRECT in the last 72 hours. Thyroid Function Tests No results for input(s): TSH, T4TOTAL, T3FREE, THYROIDAB in the last 72 hours.  Invalid input(s): FREET3 _____________  DG Chest 2 View  Result Date: 03/29/2020 CLINICAL DATA:  Preop. TAVR. EXAM: CHEST - 2 VIEW COMPARISON:  Radiograph 02/18/2020. Chest CT 03/11/2020, PET CT 03/17/2020 FINDINGS: The cardiomediastinal contours are normal. The 14 mm left lower lobe pulmonary nodule is not well demonstrated by radiograph. Pulmonary vasculature is normal. No consolidation, pleural effusion, or pneumothorax. No acute osseous abnormalities are seen. IMPRESSION: 1. No acute chest findings. 2. The 14 mm left lower lobe pulmonary nodule on recent  CT and PET is not well demonstrated by radiograph. Electronically Signed   By: Keith Rake M.D.   On: 03/29/2020 13:16   NM PET Image Initial (PI) Skull Base To Thigh  Result Date: 03/17/2020 CLINICAL DATA:  Initial treatment strategy for left lower lobe pulmonary nodule. EXAM: NUCLEAR MEDICINE PET SKULL BASE TO THIGH TECHNIQUE: 12.3 mCi F-18 FDG was injected intravenously. Full-ring PET imaging was performed from the skull base to thigh after the radiotracer. CT data was obtained and used for attenuation correction and anatomic localization. Fasting blood glucose: 143 mg/dl COMPARISON:  CTA chest abdomen pelvis dated 03/11/2020 FINDINGS: Mediastinal blood pool activity: SUV max 2.3 Liver activity: SUV max NA NECK: No hypermetabolic cervical lymphadenopathy. 15 mm lesion in the left posterior parotid gland (series 3/image 39), max SUV 5.1, raising concern for primary parotid neoplasm, benign or malignant. Benign lesion such as Warthin's tumor is common, but this is technically indeterminate. Incidental CT findings: none CHEST: 14 x 9 mm lobulated nodule in the medial left lower lobe (series 3/image 108), demonstrating only mild hypermetabolism, max SUV 2.0. However, this raises concern for primary bronchogenic neoplasm. No hypermetabolic thoracic lymphadenopathy. Incidental CT findings: Atherosclerotic calcifications of the aortic arch. Mild three-vessel coronary atherosclerosis. ABDOMEN/PELVIS: No abnormal hypermetabolism in the liver, spleen, pancreas, or adrenal glands. No hypermetabolic abdominopelvic lymphadenopathy. Postsurgical changes related to Whipple procedure.  Biliary stent. Mild rectal wall thickening (series 3/image 255), equivocal. Mild rectal hypermetabolism, max SUV 7.3, favored to be physiologic. Brachytherapy seeds in the prostate. Incidental CT findings: Atherosclerotic calcifications of the aortic arch. Moderate fat containing left inguinal hernia. SKELETON: No focal hypermetabolic  activity to suggest skeletal metastasis. Incidental CT findings: Degenerative changes of the visualized thoracolumbar spine. IMPRESSION: 14 mm left lower lobe nodule demonstrates mild hypermetabolism, raising concern for primary bronchogenic neoplasm. No findings suspicious for metastatic disease. 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 Additional postsurgical and ancillary findings as above. Electronically Signed   By: Julian Hy M.D.   On: 03/17/2020 13:49   CT CORONARY MORPH W/CTA COR W/SCORE W/CA W/CM &/OR WO/CM  Addendum Date: 03/11/2020   ADDENDUM REPORT: 03/11/2020 12:41 ADDENDUM: Extracardiac findings are detailed separately under dictation for contemporaneously obtained CTA chest, abdomen and pelvis dated 03/11/2020. Please see that dictation for full description of relevant extracardiac findings. Electronically Signed   By: Vinnie Langton M.D.   On: 03/11/2020 12:41   Result Date: 03/11/2020 CLINICAL DATA:  Aortic Stenosis EXAM: Cardiac TAVR CT TECHNIQUE: The patient was scanned on a Siemens Force 409 slice scanner. A 120 kV retrospective scan was triggered in the ascending thoracic aorta at 140 HU's. Gantry rotation speed was 250 msecs and collimation was .6 mm. No beta blockade or nitro were given. The 3D data set was reconstructed in 5% intervals of the R-R cycle. Systolic and diastolic phases were analyzed on a dedicated work station using MPR, MIP and VRT modes. The patient received 80 cc of contrast. FINDINGS: Aortic Valve: Tri- leaflet calcified with restricted leaflet motion Calcium score for valve 4503 Very bulky calcification of the right coronary leaflet Aorta: No aneurysm normal arch vessels moderate calcific atherosclerosis Sino-tubular Junction: 30 mm Ascending Thoracic Aorta: 35 mm Aortic Arch: 32 mm Descending Thoracic Aorta: 27 mm Sinus of Valsalva Measurements: Non-coronary:  35.5 mm Right - coronary: 34 mm Left -   coronary: 36.3 mm Coronary Artery Height above Annulus: Left Main: 13.8 mm above annulus Right Coronary: 18 mm above annulus Virtual Basal Annulus Measurements: Maximum / Minimum Diameter: 31 mm x 26.5 mm Perimeter: 90 mm Area: 655 mm 2 Coronary Arteries: Sufficient height above annulus for deployment Optimum Fluoroscopic Angle for Delivery: LAO 5 Caudal 8 IMPRESSION: 1. Tri leaflet AV with calcium score 4503 annular area of 655 mm2 suitable for a 29 mm Sapien 3 valve Heavy calcification fo the right coronary cusp 2.  Optimum angiographic angle for deployment LAO 5 Caudal 8 degrees 3.  Normal aortic root 3.5 cm 4.  Coronary arteries sufficient height above annulus for deployment 5. Study not optimized to R/O LAA thrombus cannot r/o filling defect but patient in NSR [employer] Electronically Signed: By: Jenkins Rouge M.D. On: 03/11/2020 12:27   DG Chest Port 1 View  Result Date: 04/01/2020 CLINICAL DATA:  Central line placement, post heart catheterization EXAM: PORTABLE CHEST 1 VIEW COMPARISON:  Portable exam 1311 hours compared to 03/28/2020 FINDINGS: Normal heart size post TAVR. RIGHT jugular line tip projecting over SVC. Mediastinal contours and pulmonary vascularity normal. Atherosclerotic calcification aorta. Lungs clear. No infiltrate, pleural effusion or pneumothorax. IMPRESSION: Post TAVR. No acute abnormalities. Electronically Signed   By: Lavonia Dana M.D.   On: 04/01/2020 13:20   CT ANGIO CHEST AORTA W/CM & OR WO/CM  Result Date: 03/11/2020 CLINICAL DATA:  74 year old male with history of severe aortic stenosis. Preprocedural study prior to potential transcatheter aortic valve replacement (TAVR) procedure. EXAM: CT ANGIOGRAPHY CHEST, ABDOMEN AND PELVIS TECHNIQUE: Non-contrast CT of the chest was initially obtained. Multidetector CT imaging through the chest, abdomen and pelvis was performed using the standard protocol during bolus administration of  intravenous contrast. Multiplanar reconstructed images and MIPs were obtained and reviewed to evaluate the vascular anatomy. CONTRAST:  13mL OMNIPAQUE IOHEXOL 350 MG/ML SOLN COMPARISON:  CT of the abdomen and pelvis 02/18/2020. FINDINGS: CTA CHEST FINDINGS Cardiovascular: Heart size is normal. There is no significant pericardial fluid, thickening or pericardial calcification. There is aortic atherosclerosis, as well as atherosclerosis of the great vessels of the mediastinum and the coronary arteries, including calcified atherosclerotic plaque in the left main, left anterior descending and right coronary arteries. Severe thickening calcification of the aortic valve. Mediastinum/Lymph Nodes: No pathologically enlarged mediastinal or hilar lymph nodes. Esophagus is unremarkable in appearance. No axillary lymphadenopathy. Lungs/Pleura: In the medial aspect of the superior segment of the left lower lobe (axial image 44 of series 5) there is a macrolobulated 1.3 x 1.0 cm pulmonary nodule. No other suspicious appearing pulmonary  nodules or masses are noted. No acute consolidative airspace disease. No pleural effusions. Musculoskeletal/Soft Tissues: There are no aggressive appearing lytic or blastic lesions noted in the visualized portions of the skeleton. CTA ABDOMEN AND PELVIS FINDINGS Hepatobiliary: Diffuse low attenuation throughout the hepatic parenchyma, indicative of hepatic steatosis. No discrete cystic or solid hepatic lesions. No intra or extrahepatic biliary ductal dilatation. Stent extending from the common bile duct (coiled upon itself) into the second portion of the duodenum. Status post cholecystectomy. Pancreas: Status post Whipple procedure. No pancreatic mass in the residual body and tail of the pancreas. Large calcification in the distal body of the pancreas measuring 1.2 cm, presumably sequela of prior pancreatitis. No peripancreatic fluid collections or inflammatory changes. Spleen: Unremarkable.  Adrenals/Urinary Tract: 1.1 cm low-attenuation lesion in the lower pole of the right kidney, compatible with a simple cyst. Other subcentimeter low-attenuation lesions in both kidneys, too small to characterize, but statistically likely to represent small cysts. Exophytic partially calcified centrally low-attenuation 1 cm lesion in the upper pole of the left kidney, compatible with a mildly complicated (Bosniak class 2) cyst. No definite suspicious renal lesions. No hydroureteronephrosis. Urinary bladder is normal in appearance. Bilateral adrenal glands are normal in appearance. Stomach/Bowel: Postoperative changes of Whipple procedure. Remaining portions of the stomach are otherwise unremarkable. No pathologic dilatation of small bowel or colon. The appendix is not confidently identified and may be surgically absent. Regardless, there are no inflammatory changes noted adjacent to the cecum to suggest the presence of an acute appendicitis at this time. Vascular/Lymphatic: Aortic atherosclerosis, without evidence of aneurysm or dissection in the abdominal or pelvic vasculature. Vascular findings and measurements pertinent to potential TAVR procedure, as detailed below. No lymphadenopathy noted in the abdomen or pelvis. Reproductive: Fiducial markers noted in the prostate gland. Seminal vesicles are unremarkable. Other: No significant volume of ascites.  No pneumoperitoneum. Musculoskeletal: Small well-defined sclerotic lesion in the anterior aspect of the L2 vertebral body, stable compared to prior study from 2018, considered likely benign (presumably a small bone island). There are no other aggressive appearing lytic or blastic lesions noted in the visualized portions of the skeleton. VASCULAR MEASUREMENTS PERTINENT TO TAVR: AORTA: Minimal Aortic Diameter-18 x 17 mm Severity of Aortic Calcification-moderate to severe RIGHT PELVIS: Right Common Iliac Artery - Minimal Diameter-11.0 x 10.8 mm Tortuosity-mild  Calcification-moderate Right External Iliac Artery - Minimal Diameter-7.2 x 5.1 mm Tortuosity-moderate Calcification-mild-to-moderate Right Common Femoral Artery - Minimal Diameter-7.5 x 5.9 mm Tortuosity-mild Calcification-mild-to-moderate LEFT PELVIS: Left Common Iliac Artery - Minimal Diameter-10.8 x 8.8 mm Tortuosity-mild Calcification-moderate Left External Iliac Artery - Minimal Diameter-8.0 x 6.2 mm Tortuosity-moderate Calcification-mild to moderate Left Common Femoral Artery - Minimal Diameter-7.2 x 8.3 mm Tortuosity-mild Calcification-mild Review of the MIP images confirms the above findings. IMPRESSION: 1. Vascular findings and measurements pertinent to potential TAVR procedure, as detailed above. 2. Severe thickening calcification of the aortic valve, compatible with reported clinical history of severe aortic stenosis. 3. In the medial aspect of the superior segment of the left lower lobe there is a 1.3 x 1.0 cm macrolobulated pulmonary nodule, concerning for primary bronchogenic neoplasm. Further evaluation with PET-CT should be considered in the near future to better evaluate this finding. 4. Hepatic steatosis. 5. Additional incidental findings, as above. These results will be called to the ordering clinician or representative by the Radiologist Assistant, and communication documented in the PACS or [employer]. Electronically Signed   By: Vinnie Langton M.D.   On: 03/11/2020 14:21   VAS US  CAROTID  Result Date: 03/11/2020 Carotid Arterial Duplex Study Indications:       Pre-TAVR evaluation. Risk Factors:      Hypertension, hyperlipidemia, Diabetes. Comparison Study:  No prior study Performing Technologist: Maudry Mayhew MHA, RDMS, RVT, RDCS  Examination Guidelines: A complete evaluation includes B-mode imaging, spectral Doppler, color Doppler, and power Doppler as needed of all accessible portions of each vessel. Bilateral testing is considered an integral part of a complete examination.  Limited examinations for reoccurring indications may be performed as noted.  Right Carotid Findings: +----------+--------+--------+--------+-------------------------+--------+           PSV cm/sEDV cm/sStenosisPlaque Description       Comments +----------+--------+--------+--------+-------------------------+--------+ CCA Prox  83      14                                                +----------+--------+--------+--------+-------------------------+--------+ CCA Distal47      12              smooth and heterogenous           +----------+--------+--------+--------+-------------------------+--------+ ICA Prox  33      12              heterogenous and calcific         +----------+--------+--------+--------+-------------------------+--------+ ICA Distal120     43                                                +----------+--------+--------+--------+-------------------------+--------+ ECA       73      11              heterogenous and calcific         +----------+--------+--------+--------+-------------------------+--------+ +----------+--------+-------+----------------+-------------------+           PSV cm/sEDV cmsDescribe        Arm Pressure (mmHG) +----------+--------+-------+----------------+-------------------+ Subclavian102            Multiphasic, WNL                    +----------+--------+-------+----------------+-------------------+ +---------+--------+--+--------+--+---------+ VertebralPSV cm/s48EDV cm/s13Antegrade +---------+--------+--+--------+--+---------+  Left Carotid Findings: +----------+-------+-------+--------+---------------------------------+--------+           PSV    EDV    StenosisPlaque Description               Comments           cm/s   cm/s                                                     +----------+-------+-------+--------+---------------------------------+--------+ CCA Prox  67     11             smooth and  heterogenous                   +----------+-------+-------+--------+---------------------------------+--------+ CCA Distal52     13             heterogenous and irregular                +----------+-------+-------+--------+---------------------------------+--------+ ICA Prox  66     19  heterogenous, irregular and                                               calcific                                  +----------+-------+-------+--------+---------------------------------+--------+ ICA Distal94     29                                                       +----------+-------+-------+--------+---------------------------------+--------+ ECA       72     5              heterogenous and irregular                +----------+-------+-------+--------+---------------------------------+--------+ +----------+--------+--------+----------------+-------------------+           PSV cm/sEDV cm/sDescribe        Arm Pressure (mmHG) +----------+--------+--------+----------------+-------------------+ [id number]             Multiphasic, WNL                    +----------+--------+--------+----------------+-------------------+ +---------+--------+--+--------+--+---------+ VertebralPSV cm/s46EDV cm/s16Antegrade +---------+--------+--+--------+--+---------+   Summary: Right Carotid: Velocities in the right ICA are consistent with a 1-39% stenosis. Left Carotid: Velocities in the left ICA are consistent with a 1-39% stenosis. Vertebrals:  Bilateral vertebral arteries demonstrate antegrade flow. Subclavians: Normal flow hemodynamics were seen in bilateral subclavian              arteries. *See table(s) above for measurements and observations.  Electronically signed by Deitra Mayo MD on 03/11/2020 at 12:45:27 PM.    Final    ECHOCARDIOGRAM LIMITED  Result Date: 04/01/2020    ECHOCARDIOGRAM LIMITED REPORT   Patient Name:   Nathaniel Little Date of Exam: 04/01/2020 Medical  Rec #:  [id number]    Height:       69.0 in Accession #:    [id number]   Weight:       212.5 lb Date of Birth:  Oct 17, 1945   BSA:          2.120 m Patient Age:    47 years     BP:           168/70 mmHg Patient Gender: M            HR:           64 bpm. Exam Location:  Inpatient Procedure: Limited Echo, Limited Color Doppler, Color Doppler and Echo Assisted            Procedure Indications:     Aortic Stenosis; TAVR Procedure  History:         Patient has prior history of Echocardiogram examinations, most                  recent 02/07/2020. CHF, COPD, Aortic Valve Disease; Risk                  Factors:Hypertension, Diabetes and Current Smoker.                  Aortic Valve: 29 mm Edwards Edwards Sapien prosthetic, stented                  (  TAVR) valve is present in the aortic position. Procedure Date:                  04/01/2020.  Sonographer:     Mikki Santee RDCS (AE) Referring Phys:  [place] Diagnosing Phys: Sanda Klein MD                   PRE-PROCEDURAL FINDINGS:                   Normal left ventricular systolic function. Estimated LVEF                  60-65%.                  There are no regional wall motion abnormalities.                  Severe calcific aortic stenosis. Trileaflet aortic valve.                  Peak aortic valve gradient 62 mm Hg, mean gradient 36 mm Hg.                  Dimensionless obstructive index 0.21, calculated aortic valve                  area is 0.8 cm.                  Mild aortic insufficiency.                  No pericardial effusion.                   POST-PROCEDURAL FINDINGS:                   Hyperdynamic left ventricular systolic function. Estimated LVEF                  >75%.                  There are no regional wall motion abnormalities.                  Well-seated TAVR stent-valve.                  Peak aortic valve gradient 8 mm Hg, mean gradient 4 mm Hg.                  Dimensionless obstructive index 0.74, calculated aortic valve                   area is 2.8 cm.                  There is no aortic insufficiency and no perivalvular leak.                  No mitral insufficiency.                  No pericardial effusion. IMPRESSIONS  1. There is a 29 mm Edwards Edwards Sapien prosthetic (TAVR) valve present in the aortic position. Procedure Date: 04/01/2020. FINDINGS  Aortic Valve: Aortic valve mean gradient measures 4.0 mmHg. Aortic valve peak gradient measures 7.5 mmHg. Aortic valve area, by VTI measures 3.75 cm. There is a 29 mm Edwards Edwards Sapien prosthetic, stented (TAVR) valve present in the aortic position. Procedure Date: 04/01/2020.  LEFT VENTRICLE PLAX 2D LVOT diam:  2.21 cm LV SV:         96 LV SV Index:   45 LVOT Area:     3.84 cm  AORTIC VALVE AV Area (Vmax):    2.86 cm AV Area (Vmean):   3.04 cm AV Area (VTI):     3.75 cm AV Vmax:           137.00 cm/s AV Vmean:          93.100 cm/s AV VTI:            0.257 m AV Peak Grad:      7.5 mmHg AV Mean Grad:      4.0 mmHg LVOT Vmax:         102.00 cm/s LVOT Vmean:        73.700 cm/s LVOT VTI:          0.251 m LVOT/AV VTI ratio: 0.98  SHUNTS Systemic VTI:  0.25 m Systemic Diam: 2.21 cm Sanda Klein MD Electronically signed by Sanda Klein MD Signature Date/Time: 04/01/2020/1:44:37 PM    Final    Structural Heart Procedure  Result Date: 04/01/2020 See surgical note for result.  CT ANGIO ABDOMEN PELVIS  W &/OR WO CONTRAST  Result Date: 03/11/2020 CLINICAL DATA:  74 year old male with history of severe aortic stenosis. Preprocedural study prior to potential transcatheter aortic valve replacement (TAVR) procedure. EXAM: CT ANGIOGRAPHY CHEST, ABDOMEN AND PELVIS TECHNIQUE: Non-contrast CT of the chest was initially obtained. Multidetector CT imaging through the chest, abdomen and pelvis was performed using the standard protocol during bolus administration of intravenous contrast. Multiplanar reconstructed images and MIPs were obtained and reviewed to evaluate the vascular  anatomy. CONTRAST:  167mL OMNIPAQUE IOHEXOL 350 MG/ML SOLN COMPARISON:  CT of the abdomen and pelvis 02/18/2020. FINDINGS: CTA CHEST FINDINGS Cardiovascular: Heart size is normal. There is no significant pericardial fluid, thickening or pericardial calcification. There is aortic atherosclerosis, as well as atherosclerosis of the great vessels of the mediastinum and the coronary arteries, including calcified atherosclerotic plaque in the left main, left anterior descending and right coronary arteries. Severe thickening calcification of the aortic valve. Mediastinum/Lymph Nodes: No pathologically enlarged mediastinal or hilar lymph nodes. Esophagus is unremarkable in appearance. No axillary lymphadenopathy. Lungs/Pleura: In the medial aspect of the superior segment of the left lower lobe (axial image 44 of series 5) there is a macrolobulated 1.3 x 1.0 cm pulmonary nodule. No other suspicious appearing pulmonary nodules or masses are noted. No acute consolidative airspace disease. No pleural effusions. Musculoskeletal/Soft Tissues: There are no aggressive appearing lytic or blastic lesions noted in the visualized portions of the skeleton. CTA ABDOMEN AND PELVIS FINDINGS Hepatobiliary: Diffuse low attenuation throughout the hepatic parenchyma, indicative of hepatic steatosis. No discrete cystic or solid hepatic lesions. No intra or extrahepatic biliary ductal dilatation. Stent extending from the common bile duct (coiled upon itself) into the second portion of the duodenum. Status post cholecystectomy. Pancreas: Status post Whipple procedure. No pancreatic mass in the residual body and tail of the pancreas. Large calcification in the distal body of the pancreas measuring 1.2 cm, presumably sequela of prior pancreatitis. No peripancreatic fluid collections or inflammatory changes. Spleen: Unremarkable. Adrenals/Urinary Tract: 1.1 cm low-attenuation lesion in the lower pole of the right kidney, compatible with a simple  cyst. Other subcentimeter low-attenuation lesions in both kidneys, too small to characterize, but statistically likely to represent small cysts. Exophytic partially calcified centrally low-attenuation 1 cm lesion in the upper pole of the left kidney, compatible with a  mildly complicated (Bosniak class 2) cyst. No definite suspicious renal lesions. No hydroureteronephrosis. Urinary bladder is normal in appearance. Bilateral adrenal glands are normal in appearance. Stomach/Bowel: Postoperative changes of Whipple procedure. Remaining portions of the stomach are otherwise unremarkable. No pathologic dilatation of small bowel or colon. The appendix is not confidently identified and may be surgically absent. Regardless, there are no inflammatory changes noted adjacent to the cecum to suggest the presence of an acute appendicitis at this time. Vascular/Lymphatic: Aortic atherosclerosis, without evidence of aneurysm or dissection in the abdominal or pelvic vasculature. Vascular findings and measurements pertinent to potential TAVR procedure, as detailed below. No lymphadenopathy noted in the abdomen or pelvis. Reproductive: Fiducial markers noted in the prostate gland. Seminal vesicles are unremarkable. Other: No significant volume of ascites.  No pneumoperitoneum. Musculoskeletal: Small well-defined sclerotic lesion in the anterior aspect of the L2 vertebral body, stable compared to prior study from 2018, considered likely benign (presumably a small bone island). There are no other aggressive appearing lytic or blastic lesions noted in the visualized portions of the skeleton. VASCULAR MEASUREMENTS PERTINENT TO TAVR: AORTA: Minimal Aortic Diameter-18 x 17 mm Severity of Aortic Calcification-moderate to severe RIGHT PELVIS: Right Common Iliac Artery - Minimal Diameter-11.0 x 10.8 mm Tortuosity-mild Calcification-moderate Right External Iliac Artery - Minimal Diameter-7.2 x 5.1 mm Tortuosity-moderate  Calcification-mild-to-moderate Right Common Femoral Artery - Minimal Diameter-7.5 x 5.9 mm Tortuosity-mild Calcification-mild-to-moderate LEFT PELVIS: Left Common Iliac Artery - Minimal Diameter-10.8 x 8.8 mm Tortuosity-mild Calcification-moderate Left External Iliac Artery - Minimal Diameter-8.0 x 6.2 mm Tortuosity-moderate Calcification-mild to moderate Left Common Femoral Artery - Minimal Diameter-7.2 x 8.3 mm Tortuosity-mild Calcification-mild Review of the MIP images confirms the above findings. IMPRESSION: 1. Vascular findings and measurements pertinent to potential TAVR procedure, as detailed above. 2. Severe thickening calcification of the aortic valve, compatible with reported clinical history of severe aortic stenosis. 3. In the medial aspect of the superior segment of the left lower lobe there is a 1.3 x 1.0 cm macrolobulated pulmonary nodule, concerning for primary bronchogenic neoplasm. Further evaluation with PET-CT should be considered in the near future to better evaluate this finding. 4. Hepatic steatosis. 5. Additional incidental findings, as above. These results will be called to the ordering clinician or representative by the Radiologist Assistant, and communication documented in the PACS or [employer]. Electronically Signed   By: Vinnie Langton M.D.   On: 03/11/2020 14:21   Disposition   Pt is being discharged home today in good condition.  Follow-up Plans & Appointments     Follow-up Information     Eileen Stanford, PA-C. Go on 04/09/2020.   Specialties: Cardiology, Radiology Why: @ 3:30pm, please arrive at least 10 minutes early.  Contact information: [address] [place] [address] [phone]                   Discharge Medications   Allergies as of 04/02/2020       Reactions   Codeine Itching        Medication List     TAKE these medications    albuterol (2.5 MG/3ML) 0.083% nebulizer solution Commonly known as:  PROVENTIL Take 2.5 mg by nebulization every 6 (six) hours as needed for wheezing or shortness of breath.   albuterol 108 (90 Base) MCG/ACT inhaler Commonly known as: VENTOLIN HFA Inhale 2 puffs into the lungs every 6 (six) hours as needed for wheezing or shortness of breath.   aspirin EC 81 MG tablet Take  81 mg by mouth daily.   atorvastatin 80 MG tablet Commonly known as: LIPITOR Take 80 mg by mouth daily.   carvedilol 3.125 MG tablet Commonly known as: COREG Take 1 tablet (3.125 mg total) by mouth 2 (two) times daily.   cloNIDine 0.1 MG tablet Commonly known as: CATAPRES Take 0.1 mg by mouth 2 (two) times daily.   clopidogrel 75 MG tablet Commonly known as: PLAVIX TAKE 1 TABLET BY MOUTH ONCE DAILY   diclofenac Sodium 1 % Gel Commonly known as: VOLTAREN Apply 2 g topically in the morning and at bedtime.   gabapentin 100 MG capsule Commonly known as: NEURONTIN Take 100-200 mg by mouth at bedtime.   hydrochlorothiazide 25 MG tablet Commonly known as: HYDRODIURIL Take 1 tablet (25 mg total) by mouth daily.   insulin glargine 100 unit/mL Sopn Commonly known as: LANTUS Inject 20 Units into the skin at bedtime.   metFORMIN 500 MG tablet Commonly known as: GLUCOPHAGE Take 1,000 mg by mouth 2 (two) times daily. Notes to patient: Resume after 48 hours after TAVR. Okay to restart 7/1 PM   pantoprazole 40 MG tablet Commonly known as: PROTONIX Take 1 tablet (40 mg total) by mouth 2 (two) times daily.   Spiriva HandiHaler 18 MCG inhalation capsule Generic drug: tiotropium INHALE CONTENTS OF 1 CAPSULE ONCE DAILY What changed: See the new instructions.   Symbicort 80-4.5 MCG/ACT inhaler Generic drug: budesonide-formoterol INHALE 2 PUFFS BY MOUTH TWICE DAILY            Outstanding Labs/Studies   none  Duration of Discharge Encounter   Greater than 30 minutes including physician time.  Mable Fill, PA-C 04/02/2020, 8:34 AM [phone]  I  have personally seen and examined this patient. I agree with the assessment and plan as outlined above.  He is doing well post TAVR. Echo with no PVL. BP stable.  Discharge home today.   Lauree Chandler 04/02/2020 12:39 PM

## 2020-04-02 NOTE — Progress Notes (Signed)
Pt being discharged. This RN called [employer] to set up transportation home. Estimated time for transportation arrival is approximately 1545.   Arletta Bale, RN

## 2020-04-03 ENCOUNTER — Telehealth: Payer: Self-pay

## 2020-04-03 LAB — POCT ACTIVATED CLOTTING TIME: Activated Clotting Time: 257 seconds

## 2020-04-03 LAB — POCT I-STAT, CHEM 8
BUN: 12 mg/dL (ref 8–23)
Calcium, Ion: 1.28 mmol/L (ref 1.15–1.40)
Chloride: 102 mmol/L (ref 98–111)
Creatinine, Ser: 0.7 mg/dL (ref 0.61–1.24)
Glucose, Bld: 136 mg/dL — ABNORMAL HIGH (ref 70–99)
HCT: 33 % — ABNORMAL LOW (ref 39.0–52.0)
Hemoglobin: 11.2 g/dL — ABNORMAL LOW (ref 13.0–17.0)
Potassium: 3.2 mmol/L — ABNORMAL LOW (ref 3.5–5.1)
Sodium: 141 mmol/L (ref 135–145)
TCO2: 25 mmol/L (ref 22–32)

## 2020-04-03 LAB — POCT I-STAT 7, (LYTES, BLD GAS, ICA,H+H)
Acid-base deficit: 1 mmol/L (ref 0.0–2.0)
Bicarbonate: 26 mmol/L (ref 20.0–28.0)
Calcium, Ion: 1.28 mmol/L (ref 1.15–1.40)
HCT: 32 % — ABNORMAL LOW (ref 39.0–52.0)
Hemoglobin: 10.9 g/dL — ABNORMAL LOW (ref 13.0–17.0)
O2 Saturation: 100 %
Potassium: 3.2 mmol/L — ABNORMAL LOW (ref 3.5–5.1)
Sodium: 141 mmol/L (ref 135–145)
TCO2: 28 mmol/L (ref 22–32)
pCO2 arterial: 50.4 mmHg — ABNORMAL HIGH (ref 32.0–48.0)
pH, Arterial: 7.321 — ABNORMAL LOW (ref 7.350–7.450)
pO2, Arterial: 195 mmHg — ABNORMAL HIGH (ref 83.0–108.0)

## 2020-04-03 NOTE — Telephone Encounter (Signed)
Patient contacted regarding discharge from [facility] on 04/02/2020 .  Patient understands to follow up with provider Nell Range PA-C on 04/09/2020 at 3:30 PM at [facility] office. Patient understands discharge instructions? yes Patient understands medications and regiment? yes Patient understands to bring all medications to this visit? yes

## 2020-04-07 ENCOUNTER — Telehealth: Payer: Medicare Other

## 2020-04-08 NOTE — Progress Notes (Deleted)
[facility] [name]                                       Cardiology Office Note    Date:  04/08/2020   ID:  Nathaniel Little, DOB [birth year], MRN [id number]  PCP:  Verl Bangs, FNP  Cardiologist:  Ida Rogue, MD / Dr. Angelena Form & Dr. Roxy Manns (TAVR)  CC: TOC s/p TAVR  History of Present Illness:  Nathaniel Little is a 74 y.o. 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   Patient was first evaluated by a cardiologist in 2018 for symptoms of exertional shortness of breath. Stress test was felt to be low risk for coronary ischemia and echocardiogram revealed normal left ventricular systolic function with moderate aortic stenosis. Symptoms were attributed to heart failure with preserved ejection fraction and COPD.   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 The common and proper hepatic arteries were found to be dissected and occluded during surgery and ultimately the patient underwent stent placement in the gastroduodenal artery and hepatic arteries. He was started on Plavix at that time.  The patient underwent follow-up echocardiogram Feb 07, 2020 revealing significant progression in the severity of aortic stenosis with preserved left ventricular systolic function. Peak velocity across aortic valve was measured 4.1 m/s corresponding to mean transvalvular gradient estimated 37 mmHg. DVI was reported 0.23 and aortic valve area calculated 0.85 cm by VTI. Left ventricular systolic function remain preserved with  ejection fraction estimated 65%. The patient was referred to the multidisciplinary heart valve clinic for management. Shortly after that the patient was hospitalized briefly at [facility] with abdominal pain, elevated white blood count, and mild lactic acidosis. CT scan of the abdomen and pelvis revealed "considerable peripancreatic and duodenal inflammatory change consistent with some acute pancreatitis or duodenitis.The patient was treated with ciprofloxacin and Flagyl and symptoms resolved. During his hospitalization he was evaluated by cardiology and he underwent diagnostic cardiac catheterization by Dr. Rockey Little on Feb 21, 2020. Catheterization revealed nonobstructive coronary artery disease with normal left ventricular systolic function and severe aortic stenosis. Mean transvalvular gradient across the aortic valve was measured 45 mmHg corresponding to valve area calculated 0.95 cm. There was very mild elevation of right-sided pressures. Patient was seen in consultation by Dr. Angelena Form and CT angiography was performed. CT scan of the chest revealed small pulmonary nodule in the left lower lobe suspicious for bronchogenic neoplasm. Follow up PET scan on 03/17/20 showed mild hypermetabolism, raising concern for primary bronchogenic neoplasm. There were no findings suspicious for metastatic disease. 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   He was evaluated by the multidisciplinary valve team and underwent successful TAVR with a 29 mm Edwards Sapien 3 THV via the TF approach on 04/01/20. Post operative showed EF 60%, normally functioning TAVR with a mean gradient of 14 mmHg and trivial PVL.He was discharged on continued aspirin and plavix.   Today  he presents to clinic for follow up.     Past Medical History:  Diagnosis Date  . (HFpEF) heart failure with preserved ejection  fraction ([employer])    a. 08/2017 Echo: EF 60-65%, Gr1 DD.  [name] Arthritis   . COPD (chronic obstructive pulmonary disease) ([place])   . Depression   . Diabetes mellitus without complication ([place])   . Hypertension   . Incidental pulmonary nodule, greater than or equal to 33mm 03/11/2020   Left lower lobe - suspicious for bronchogenic neoplasm  . Lymphedema   . Morbid obesity ([place])   . Prostate cancer ([place]) 03/2013   ? PANCREATIC  . S/P TAVR (transcatheter aortic valve replacement) 04/01/2020   s/p TAVR with a 29 mm Edwards Sapien 3 THV via the TF approach by Drs Buena Irish & Roxy Manns  . Severe aortic stenosis   . Sleep apnea with use of continuous positive airway pressure (CPAP)   . Tobacco abuse   . Tremor    HEAD    Past Surgical History:  Procedure Laterality Date  . ADENOIDECTOMY    . APPENDECTOMY    . CARDIAC CATHETERIZATION    . CATARACT EXTRACTION W/PHACO Left 11/28/2018   Procedure: CATARACT EXTRACTION PHACO AND INTRAOCULAR LENS PLACEMENT (IOC) LEFT, DIABETIC;  Surgeon: Birder Robson, MD;  Location: [facility] ORS;  Service: Ophthalmology;  Laterality: Left;  [name]  00:56 CDE 9.44 Fluid pack lot # [id number]  . EUS N/A 10/13/2017   Procedure: FULL UPPER ENDOSCOPIC ULTRASOUND (EUS) RADIAL;  Surgeon: Holly Bodily, MD;  Location: [facility] ENDOSCOPY;  Service: Gastroenterology;  Laterality: N/A;  . PANCREATICODUODENECTOMY  07/23/2018  . RIGHT/LEFT HEART CATH AND CORONARY ANGIOGRAPHY N/A 02/21/2020   Procedure: RIGHT/LEFT HEART CATH AND CORONARY ANGIOGRAPHY;  Surgeon: Minna Merritts, MD;  Location: [place] CV LAB;  Service: Cardiovascular;  Laterality: N/A;  . TEE WITHOUT CARDIOVERSION N/A 04/01/2020   Procedure: TRANSESOPHAGEAL ECHOCARDIOGRAM (TEE);  Surgeon: Burnell Blanks, MD;  Location: [place] CV LAB;  Service: Open Heart Surgery;  Laterality: N/A;  . TONSILLECTOMY    . TRANSCATHETER AORTIC VALVE REPLACEMENT, TRANSFEMORAL N/A 04/01/2020   Procedure: TRANSCATHETER AORTIC  VALVE REPLACEMENT, TRANSFEMORAL;  Surgeon: Burnell Blanks, MD;  Location: [place] CV LAB;  Service: Open Heart Surgery;  Laterality: N/A;  . wipple      Current Medications: Outpatient Medications Prior to Visit  Medication Sig Dispense Refill  . albuterol (PROVENTIL) (2.5 MG/3ML) 0.083% nebulizer solution Take 2.5 mg by nebulization every 6 (six) hours as needed for wheezing or shortness of breath.    [name] albuterol (VENTOLIN HFA) 108 (90 Base) MCG/ACT inhaler Inhale 2 puffs into the lungs every 6 (six) hours as needed for wheezing or shortness of breath. 6.7 g 2  . aspirin EC 81 MG tablet Take 81 mg by mouth daily.    [name] atorvastatin (LIPITOR) 80 MG tablet Take 80 mg by mouth daily.    . carvedilol (COREG) 3.125 MG tablet Take 1 tablet (3.125 mg total) by mouth 2 (two) times daily. 60 tablet 5  . cloNIDine (CATAPRES) 0.1 MG tablet Take 0.1 mg by mouth 2 (two) times daily.    . clopidogrel (PLAVIX) 75 MG tablet TAKE 1 TABLET BY MOUTH ONCE DAILY (Patient taking differently: Take 75 mg by mouth daily. ) 90 tablet 0  . diclofenac Sodium (VOLTAREN) 1 % GEL Apply 2 g topically in the morning and at bedtime.    . gabapentin (NEURONTIN) 100 MG capsule Take 100-200 mg by mouth at bedtime.     [name]  hydrochlorothiazide (HYDRODIURIL) 25 MG tablet Take 1 tablet (25 mg total) by mouth daily. 30 tablet 5  . insulin glargine (LANTUS) 100 unit/mL SOPN Inject 20 Units into the skin at bedtime.    . metFORMIN (GLUCOPHAGE) 500 MG tablet Take 1,000 mg by mouth 2 (two) times daily.     . pantoprazole (PROTONIX) 40 MG tablet Take 1 tablet (40 mg total) by mouth 2 (two) times daily. 60 tablet 11  . SPIRIVA HANDIHALER 18 MCG inhalation capsule INHALE CONTENTS OF 1 CAPSULE ONCE DAILY (Patient taking differently: Place 18 mcg into inhaler and inhale daily. ) 30 capsule 5  . SYMBICORT 80-4.5 MCG/ACT inhaler INHALE 2 PUFFS BY MOUTH TWICE DAILY (Patient taking differently: Inhale 2 puffs into the lungs 2 (two) times  daily. ) 10.2 g 3   No facility-administered medications prior to visit.     Allergies:   Codeine   Social History   Socioeconomic History  . Marital status: Widowed    Spouse name: Not on file  . Number of children: 2  . Years of education: Not on file  . Highest education level: Not on file  Occupational History  . Occupation: retired Social research officer, government  Tobacco Use  . Smoking status: Current Some Day Smoker    Packs/day: 0.25    Years: 68.00    Pack years: 17.00    Types: Cigarettes  . Smokeless tobacco: Former Systems developer    Types: Chew, [place] date: 2000  . Tobacco comment: 5 cigs daily 01/29/20/ER  Vaping Use  . Vaping Use: Never used  Substance and Sexual Activity  . Alcohol use: Not Currently    Alcohol/week: 0.0 - 1.0 standard drinks    Comment: quit 40years ago  . Drug use: No  . Sexual activity: Not Currently  Other Topics Concern  . Not on file  Social History Narrative   Living at [place] Strain: Medium Risk  . Difficulty of Paying Living Expenses: Somewhat hard  Food Insecurity: No Food Insecurity  . Worried About Charity fundraiser in the Last Year: Never true  . Ran Out of Food in the Last Year: Never true  Transportation Needs: No Transportation Needs  . Lack of Transportation (Medical): No  . Lack of Transportation (Non-Medical): No  Physical Activity: Insufficiently Active  . Days of Exercise per Week: 3 days  . Minutes of Exercise per Session: 10 min  Stress: No Stress Concern Present  . Feeling of Stress : Only a little  Social Connections: Moderately Isolated  . Frequency of Communication with Friends and Family: More than three times a week  . Frequency of Social Gatherings with Friends and Family: More than three times a week  . Attends Religious Services: More than 4 times per year  . Active Member of Clubs or Organizations: No  . Attends Archivist Meetings:  Never  . Marital Status: Widowed     Family History:  The patient's ***family history includes Breast cancer in his mother; Cancer in his father; Cerebral palsy in his brother; Tuberculosis in his paternal uncle.     ROS:   Please see the history of present illness.    ROS All other systems reviewed and are negative.   PHYSICAL EXAM:   VS:  There were no vitals taken for this visit.   GEN: Well nourished, well developed, in no acute distress HEENT: normal Neck: no  JVD or masses Cardiac: ***RRR; no murmurs, rubs, or gallops,no edema  Respiratory:  clear to auscultation bilaterally, normal work of breathing GI: soft, nontender, nondistended, + BS MS: no deformity or atrophy Skin: warm and dry, no rash Neuro:  Alert and Oriented x 3, Strength and sensation are intact Psych: euthymic mood, full affect   Wt Readings from Last 3 Encounters:  04/02/20 212 lb 1.3 oz (96.2 kg)  03/28/20 212 lb 8.4 oz (96.4 kg)  03/21/20 213 lb (96.6 kg)      Studies/Labs Reviewed:   EKG:  EKG is*** ordered today.  The ekg ordered today demonstrates ***  Recent Labs: 03/28/2020: ALT 30; B Natriuretic Peptide 79.9 04/02/2020: BUN 12; Creatinine, Ser 0.91; Hemoglobin 10.3; Magnesium 1.7; Platelets 181; Potassium 3.5; Sodium 138   Lipid Panel    Component Value Date/Time   CHOL 149 02/08/2020 1228   TRIG 108 02/08/2020 1228   HDL 38 (L) 02/08/2020 1228   CHOLHDL 3.9 02/08/2020 1228   CHOLHDL 3.6 12/06/2019 1420   [place] 91 02/08/2020 1228   [place] 82 12/06/2019 1420    Additional studies/ records that were reviewed today include:  TAVR OPERATIVE NOTE   Date of Procedure:04/01/2020  Preoperative Diagnosis:Severe Aortic Stenosis   Postoperative Diagnosis:Same   Procedure:   Transcatheter Aortic Valve Replacement - PercutaneousRightTransfemoral Approach Edwards Sapien 3 THV (size 27mm, model # 9600TFX, serial  [id number])  Co-Surgeons:Christopher Angelena Form, MD andClarence H. Roxy Manns, MD   Anesthesiologist:William Therisa Doyne, MD  Echocardiographer:Mihai Croitoru, MD  Pre-operative Echo Findings: ? Severe aortic stenosis ? Normalleft ventricular systolic function  Post-operative Echo Findings: ? Noparavalvular leak ? Normalleft ventricular systolic function  _____________   Echo 04/02/20:  IMPRESSIONS 1. Left ventricular ejection fraction, by estimation, is 70 to 75%. The  left ventricle has hyperdynamic function. The left ventricle has no  regional wall motion abnormalities. There is mild concentric left  ventricular hypertrophy. Left ventricular  diastolic parameters are consistent with Grade I diastolic dysfunction  (impaired relaxation).  2. Right ventricular systolic function is normal. The right ventricular  size is normal. Tricuspid regurgitation signal is inadequate for assessing  PA pressure.  3. Left atrial size was mildly dilated.  4. The mitral valve is normal in structure. No evidence of mitral valve  regurgitation.  5. The aortic valve has been repaired/replaced. Aortic valve  regurgitation is trivial. Procedure Date: 04/01/2020. Echo findings are  consistent with normal structure and function of the aortic valve  prosthesis. Aortic valve mean gradient measures 14.3  mmHg. Aortic valve Vmax measures 2.63 m/s.   Comparison(s): A prior study was performed on 04/01/2020. Prior images  reviewed side by side. There is a trivial perivalvular leak seen and the  TAVR gradients are slightly higher, due to increased cardiac output.   ASSESSMENT & PLAN:   Severe AS s/p TAVR:   HTN: resume home meds.   Pulmonary nodule concerning for primary bronchogenic carcinoma: will make outpaitnet referral to the multidisciplinary thoracic cancer team   Tobacco abuse: counseled on completed  cessation   Medication Adjustments/Labs and Tests Ordered: Current medicines are reviewed at length with the patient today.  Concerns regarding medicines are outlined above.  Medication changes, Labs and Tests ordered today are listed in the Patient Instructions below. There are no Patient Instructions on file for this visit.   Signed, Angelena Form, PA-C  04/08/2020 8:42 PM    [facility] [place], [place], [place]  [address] Phone: [phone]; Fax: [phone]  [phone]    

## 2020-04-09 ENCOUNTER — Ambulatory Visit: Payer: Medicare Other | Admitting: Physician Assistant

## 2020-04-10 ENCOUNTER — Telehealth: Payer: Self-pay | Admitting: Licensed Clinical Social Worker

## 2020-04-10 NOTE — Telephone Encounter (Signed)
CSW received referral to assist with transportation to appointment on 05-01-2020 at the [facility] location. CSW left message for return call to arrange for transport needs. Raquel Sarna, [place], [place]

## 2020-04-14 ENCOUNTER — Telehealth: Payer: Self-pay | Admitting: Cardiovascular Disease

## 2020-04-14 ENCOUNTER — Ambulatory Visit (INDEPENDENT_AMBULATORY_CARE_PROVIDER_SITE_OTHER): Payer: Medicare Other | Admitting: Pharmacist

## 2020-04-14 ENCOUNTER — Telehealth: Payer: Self-pay

## 2020-04-14 DIAGNOSIS — E1165 Type 2 diabetes mellitus with hyperglycemia: Secondary | ICD-10-CM

## 2020-04-14 DIAGNOSIS — I10 Essential (primary) hypertension: Secondary | ICD-10-CM

## 2020-04-14 NOTE — Patient Instructions (Signed)
Thank you allowing the Chronic Care Management Team to be a part of your care! It was a pleasure speaking with you today!     CCM (Chronic Care Management) Team    Noreene Larsson RN, MSN, CCM Nurse Care Coordinator  [phone]   Harlow Asa PharmD  Clinical Pharmacist  [phone]   Eula Fried LCSW Clinical Social Worker [phone]  Visit Information  Goals Addressed            This Visit's Progress    PharmD - Medication Review       CARE PLAN ENTRY (see longitudinal plan of care for additional care plan information)   Current Barriers:   Chronic Disease Management support, education, and care coordination needs related to HTN, OSA on CPAP, COPD, GERD, aortic atherosclerosis, tobacco use, arthritis  Pharmacist Clinical Goal(s):   Over the next 30 days, patient will work with CM Pharmacist to address needs related to medication management  Interventions:  Inter-disciplinary care team collaboration (see longitudinal plan of care)  Perform chart review o Patient admitted at [facility] from 6/29 - 6/30 for TAVR (transcatheter aortic valve replacement) o Note patient missed post-TAVR follow up appointment with Cardiology on 7/7 due to lack of transportation. o Patient left message by LCSW with [facility] on 7/8 for assistance with transportation.  Counsel on importance of medication adherence - Reports he has NOT resumed medications as ordered at discharge - ONLY taking metformin and using inhalers o Counsel patient to resume all medications as ordered at discharge.  o Patient is able to locate discharge instructions. Confirms having all medications on discharge medication list. States that he will fill oral medications accordingly into weekly pillbox and start taking these, as well as resume his Lantus, today  Counsel on importance of follow up with providers for new medical questions/concerns, particularly for any of the symptoms included on his discharge  paperwork. o Reports feels like he has been having hot flashes in the evenings, starting ~2 days after discharged from the hospital. Reports has checked his temperature - denies having had a fever, temperature running ~ 98.6. Denies other symptoms o Reports will call PCP office today to ask about the hot flashes  Counsel patient to follow up with [facility] LCSW regarding transportation to future appointment  Counsel patient to restart home blood pressure monitoring o Denies having checked BP recently, but states will resume  Counsel patient to continue home blood sugar monitoring o Reports morning fasting blood sugar today: 177  Will mail patient new BP log as requested.  Collaborate with PCP  Collaborate with Pam, RN with [facility] office regarding medications patient has missed since hospital discharge (including aspirin, clopidogrel and BP medications) and report of evening hot flashes. Pam advises that she has notified Dr. Rockey Situ and provider also recommends patient resume medications as ordered at discharge. Pam recommends also notifying Dr. Angelena Form in the [place] office.  Coordination of care calls to [facility]. Leave messages with Altha Harm and Sunday Spillers. Altha Harm advises that Dr. Angelena Form is not in the office today but will request clinical team return my call.  Patient Self Care Activities:   Self administers medications as prescribed o Uses twice daily weekly pillbox   Attends all scheduled provider appointments o Next appointment with Cardiology on 7/29  Calls pharmacy for medication refills  Calls provider office for new concerns or questions  Please see past updates related to this goal by clicking on the "Past Updates" button in  the selected goal         Patient verbalizes understanding of instructions provided today.   The care management team will reach out to the patient again over the next 30 days.   Harlow Asa, PharmD, [place] [employer] [phone]

## 2020-04-14 NOTE — Telephone Encounter (Signed)
Copied from [place] [id number]. Topic: General - Other >> Apr 14, 2020 11:05 AM Yvette Rack wrote: Reason for CRM: Pt requested that Cyndia Skeeters return his call as he has some questions that he would like to ask her. Cb# [phone]   Attempted to return the patient call, no answer. LMOM to return my call. If the patient calls back please try to get more details on the reason for the call.

## 2020-04-14 NOTE — Telephone Encounter (Signed)
New message   Per Benjamine Mola patient has been off medications ordered at discharge and she would like a call back to discuss. Please call.

## 2020-04-14 NOTE — Telephone Encounter (Signed)
Elizabeth from [place] called to collaborate on this patient. She reports that he has not been taking any medications since his procedure on 04/01/20 other than Metformin. He has not taken aspirin or clopidogrel. Reviewed that Dr. Angelena Form performed his procedure and she may want to make him aware as well. Advised that I would make Dr. Rockey Situ aware and to let us know if we can assist any further.

## 2020-04-14 NOTE — Telephone Encounter (Signed)
Follow Up:   Nathaniel Little called back. She said Pam at [place] office said that Dr Angelena Form office need to please handle this message. She said Dr Angelena Form was handling his Clopidogrel.

## 2020-04-14 NOTE — Chronic Care Management (AMB) (Signed)
Chronic Care Management   Follow Up Note   04/14/2020 Name: Tonya Carlile MRN: [id number] DOB: Apr 05, 1946  Referred by: Verl Bangs, FNP Reason for referral : Chronic Care Management (Patient Phone Call)   Algie Westry is a 74 y.o. year old male who is a primary care patient of Lorine Bears, [name], [employer]. The CCM team was consulted for assistance with chronic disease management and care coordination needs.    Note patient admitted at [facility] 6/29 - 6/30 for TAVR (transcatheter aortic valve replacement)  I reached out to [name] by phone today.   Review of patient status, including review of consultants reports, relevant laboratory and other test results, and collaboration with appropriate care team members and the patient's provider was performed as part of comprehensive patient evaluation and provision of chronic care management services.     Outpatient Encounter Medications as of 04/14/2020  Medication Sig Note  . metFORMIN (GLUCOPHAGE) 500 MG tablet Take 1,000 mg by mouth 2 (two) times daily.    [name] SPIRIVA HANDIHALER 18 MCG inhalation capsule INHALE CONTENTS OF 1 CAPSULE ONCE DAILY (Patient taking differently: Place 18 mcg into inhaler and inhale daily. )   . SYMBICORT 80-4.5 MCG/ACT inhaler INHALE 2 PUFFS BY MOUTH TWICE DAILY (Patient taking differently: Inhale 2 puffs into the lungs 2 (two) times daily. )   . albuterol (PROVENTIL) (2.5 MG/3ML) 0.083% nebulizer solution Take 2.5 mg by nebulization every 6 (six) hours as needed for wheezing or shortness of breath.   [name] albuterol (VENTOLIN HFA) 108 (90 Base) MCG/ACT inhaler Inhale 2 puffs into the lungs every 6 (six) hours as needed for wheezing or shortness of breath.   [name] aspirin EC 81 MG tablet Take 81 mg by mouth daily.   [name] atorvastatin (LIPITOR) 80 MG tablet Take 80 mg by mouth daily.   . carvedilol (COREG) 3.125 MG tablet Take 1 tablet (3.125 mg total) by mouth 2 (two) times daily.   . cloNIDine (CATAPRES) 0.1 MG tablet Take  0.1 mg by mouth 2 (two) times daily.   . clopidogrel (PLAVIX) 75 MG tablet TAKE 1 TABLET BY MOUTH ONCE DAILY (Patient taking differently: Take 75 mg by mouth daily. )   . diclofenac Sodium (VOLTAREN) 1 % GEL Apply 2 g topically in the morning and at bedtime.   . gabapentin (NEURONTIN) 100 MG capsule Take 100-200 mg by mouth at bedtime.    . hydrochlorothiazide (HYDRODIURIL) 25 MG tablet Take 1 tablet (25 mg total) by mouth daily.   . insulin glargine (LANTUS) 100 unit/mL SOPN Inject 20 Units into the skin at bedtime. 03/28/2020: Pt will be using 10 units prior to procedure.  . pantoprazole (PROTONIX) 40 MG tablet Take 1 tablet (40 mg total) by mouth 2 (two) times daily.    No facility-administered encounter medications on file as of 04/14/2020.    Goals Addressed            This Visit's Progress   . PharmD - Medication Review       CARE PLAN ENTRY (see longitudinal plan of care for additional care plan information)   Current Barriers:  . Chronic Disease Management support, education, and care coordination needs related to HTN, OSA on CPAP, COPD, GERD, aortic atherosclerosis, tobacco use, arthritis  Pharmacist Clinical Goal(s):  [name] Over the next 30 days, patient will work with CM Pharmacist to address needs related to medication management  Interventions: . Inter-disciplinary care team collaboration (see longitudinal plan of care) . Perform chart review  o Patient admitted at [facility] from 6/29 - 6/30 for TAVR (transcatheter aortic valve replacement) o Note patient missed post-TAVR follow up appointment with Cardiology on 7/7 due to lack of transportation. o Patient left message by LCSW with [facility] on 7/8 for assistance with transportation. Myles Rosenthal on importance of medication adherence - Reports he has NOT resumed medications as ordered at discharge - ONLY taking metformin and using inhalers o Counsel patient to resume all medications as ordered at discharge.  o Patient is able  to locate discharge instructions. Confirms having all medications on discharge medication list. States that he will fill oral medications accordingly into weekly pillbox and start taking these, as well as resume his Lantus, today . Counsel on importance of follow up with providers for new medical questions/concerns, particularly for any of the symptoms included on his discharge paperwork. o Reports feels like he has been having hot flashes in the evenings, starting ~2 days after discharged from the hospital. Reports has checked his temperature - denies having had a fever, temperature running ~ 98.6. Denies other symptoms o Reports will call PCP office today to ask about the hot flashes . Counsel patient to follow up with [facility] LCSW regarding transportation to future appointment . Counsel patient to restart home blood pressure monitoring o Denies having checked BP recently, but states will resume . Counsel patient to continue home blood sugar monitoring o Reports morning fasting blood sugar today: 177 . Will mail patient new BP log as requested. Roney Marion with PCP . Collaborate with Pam, RN with [facility] office regarding medications patient has missed since hospital discharge (including aspirin, clopidogrel and BP medications) and report of evening hot flashes. Pam advises that she has notified Dr. Rockey Situ and provider also recommends patient resume medications as ordered at discharge. Pam recommends also notifying Dr. Angelena Form in the [facility] office. . Coordination of care calls to [facility]. Leave messages with Altha Harm and Sunday Spillers. Altha Harm advises that Dr. Angelena Form is not in the office today but will request clinical team return my call.  Patient Self Care Activities:  . Self administers medications as prescribed o Uses twice daily weekly pillbox  . Attends all scheduled provider appointments o Next appointment with Cardiology on 7/29 . Calls  pharmacy for medication refills . Calls provider office for new concerns or questions  Please see past updates related to this goal by clicking on the "Past Updates" button in the selected goal         Plan  The care management team will reach out to the patient again over the next 30 days.   Harlow Asa, PharmD, [place] [employer] [phone]

## 2020-04-15 ENCOUNTER — Ambulatory Visit: Payer: Self-pay | Admitting: Licensed Clinical Social Worker

## 2020-04-15 DIAGNOSIS — E1165 Type 2 diabetes mellitus with hyperglycemia: Secondary | ICD-10-CM

## 2020-04-15 DIAGNOSIS — F324 Major depressive disorder, single episode, in partial remission: Secondary | ICD-10-CM | POA: Diagnosis not present

## 2020-04-15 DIAGNOSIS — I5032 Chronic diastolic (congestive) heart failure: Secondary | ICD-10-CM | POA: Diagnosis not present

## 2020-04-15 DIAGNOSIS — J449 Chronic obstructive pulmonary disease, unspecified: Secondary | ICD-10-CM | POA: Diagnosis not present

## 2020-04-15 DIAGNOSIS — I10 Essential (primary) hypertension: Secondary | ICD-10-CM

## 2020-04-15 NOTE — Telephone Encounter (Signed)
Spoke with pt yesterday about his medications; however, he did not know what he was taking and wasn't at home. I called him back today with no answer. Will await call back.

## 2020-04-15 NOTE — Chronic Care Management (AMB) (Signed)
Chronic Care Management    Clinical Social Work Follow Up Note  04/15/2020 Name: Nathaniel Little MRN: [id number] DOB: [birth year]  Nathaniel Little is a 74 y.o. year old male who is a primary care patient of Lorine Bears, [name], FNP. The CCM team was consulted for assistance with [employer] .   Review of patient status, including review of consultants reports, other relevant assessments, and collaboration with appropriate care team members and the patient's provider was performed as part of comprehensive patient evaluation and provision of chronic care management services.    SDOH (Social Determinants of Health) assessments performed: Yes    Outpatient Encounter Medications as of 04/15/2020  Medication Sig Note  . albuterol (PROVENTIL) (2.5 MG/3ML) 0.083% nebulizer solution Take 2.5 mg by nebulization every 6 (six) hours as needed for wheezing or shortness of breath.   [name] albuterol (VENTOLIN HFA) 108 (90 Base) MCG/ACT inhaler Inhale 2 puffs into the lungs every 6 (six) hours as needed for wheezing or shortness of breath.   [name] aspirin EC 81 MG tablet Take 81 mg by mouth daily.   [name] atorvastatin (LIPITOR) 80 MG tablet Take 80 mg by mouth daily.   . carvedilol (COREG) 3.125 MG tablet Take 1 tablet (3.125 mg total) by mouth 2 (two) times daily.   . cloNIDine (CATAPRES) 0.1 MG tablet Take 0.1 mg by mouth 2 (two) times daily.   . clopidogrel (PLAVIX) 75 MG tablet TAKE 1 TABLET BY MOUTH ONCE DAILY (Patient taking differently: Take 75 mg by mouth daily. )   . diclofenac Sodium (VOLTAREN) 1 % GEL Apply 2 g topically in the morning and at bedtime.   . gabapentin (NEURONTIN) 100 MG capsule Take 100-200 mg by mouth at bedtime.    . hydrochlorothiazide (HYDRODIURIL) 25 MG tablet Take 1 tablet (25 mg total) by mouth daily.   . insulin glargine (LANTUS) 100 unit/mL SOPN Inject 20 Units into the skin at bedtime. 03/28/2020: Pt will be using 10 units prior to procedure.  . metFORMIN (GLUCOPHAGE) 500 MG tablet Take  1,000 mg by mouth 2 (two) times daily.    . pantoprazole (PROTONIX) 40 MG tablet Take 1 tablet (40 mg total) by mouth 2 (two) times daily.   [name] SPIRIVA HANDIHALER 18 MCG inhalation capsule INHALE CONTENTS OF 1 CAPSULE ONCE DAILY (Patient taking differently: Place 18 mcg into inhaler and inhale daily. )   . SYMBICORT 80-4.5 MCG/ACT inhaler INHALE 2 PUFFS BY MOUTH TWICE DAILY (Patient taking differently: Inhale 2 puffs into the lungs 2 (two) times daily. )    No facility-administered encounter medications on file as of 04/15/2020.     Goals Addressed    .  SW: I need some extra support right now (pt-stated)        CARE PLAN ENTRY (see longtitudinal plan of care for additional care plan information)  Current Barriers:  . Financial constraints related to managing health care expenses and affording needed DME . Limited social support . ADL IADL limitations . Social Isolation . Limited access to caregiver . Inability to perform ADL's independently . Inability to perform IADL's independently  Clinical Social Work Clinical Goal(s):  [name] Over the next 120 days, patient will work with SW to address concerns related to gaining additional support/resource connection in order to maintain health . Over the next 120 days, patient will demonstrate improved adherence to self care as evidenced by implementing healthy self-care into his daily routine such as: attending all medical appointments, taking time for self-reflection, taking medications  as prescribed, drinking water and daily exercise to improve mobility.  Interventions: . Patient interviewed and appropriate assessments performed. Referral received from PCP-He has COPD and is well controlled without exacerbation but recently went back to smoking and now has daily coughing with increased mucus production nightly around 6pm.  I am hoping to have help with reaching out to these specialists offices and making sure he can get to them.  He has a rolling  walker and utilizes mass transit for transportation.  . Provided patient with information about available transportation benefits within his insurance. Patient use [employer] in order to get to his medical appointments. However, they will not drive over 25 mins from his residence. Patient reports that he will have a follow up cancer appointment in [place] at [facility] next year and will need transportation assistance for this arrangement. LCSW provided education on [facility] transportation and patient confirms that he has used this service in the past and will do so again when needing transportation arrangements out of [employer]. . Discussed plans with patient for ongoing care management follow up and provided patient with direct contact information for care management team . Education provided in regards improving self-care. Patient admits that he has started back smoking cigarettes after 4 months of discontinuing them. Patient reports smoking 3 cigarettes per day still. Advised patient to implement appropriate coping skills and self-care tools into his daily routine to improve overall health care.  . Patient reports that his mouth is very dry from using his CPAP machine at night. He reports that this has caused him some complications and irritation of the skin.  . Assisted patient/caregiver with obtaining information about health plan benefits . Patient shares that he was put on new medication injection that he uses every night to help keep his blood sugars low. He reports that he has only been on this medication for a week and has already seen positive outcomes. . Provided education and assistance to client regarding Advanced Directives. . Patient reports that he is sleeping a lot more but does not contribute this to any mental health concern but rather his health and fatigue. Emotional support and active reflective listening provided. Patient denied needing mental health resource  education at this time.  . Patient confirms stable transportation through [facility] to all upcoming medical appointments.  . Patient reports that he is really trying to implement healthy eating/living into his daily routine to help his cholesterol. Positive reinforcement provided for this achievement. . Patient denies being in any pain or discomfort at this time but reports ongoing issues with his sleep.  . Patient has questions about several "black spots" that have appeared on his top left leg. Patient reports that these look like "dark lines on my skin." Patient denies any pain or itching from these but is wondering if he should be concerned. LCSW will send in basket message to CCM RNCM to make sure she is aware. Patient is agreeable to contact PCP if this area starts to grow.  Patient Self Care Activities:  . Self administers medications as prescribed . Attends all scheduled provider appointments . Calls provider office for new concerns or questions . Lacks social connections  Please see past updates related to this goal by clicking on the "Past Updates" button in the selected goal      Follow Up Plan: SW will follow up with patient by phone over the next quarter  Eula Fried, [place], MSW, [facility]  [email or web address] Phone: [phone]

## 2020-04-16 NOTE — Telephone Encounter (Signed)
The pt is scheduled for a virtual appt for tomorrow.

## 2020-04-16 NOTE — Telephone Encounter (Signed)
I spoke with the patient and he informed me tat he can schedule an appt, but he would have to arrange it with transportation. Transportation requires a 3 days notice prior to an appt. I informed him that we would like to see him a

## 2020-04-16 NOTE — Telephone Encounter (Signed)
Can we get him scheduled for an in office visit?  Thanks

## 2020-04-16 NOTE — Telephone Encounter (Signed)
Spoke with pt today and it appears he is on all the correct medications including aspirin and plavix. He does note worsening hot flashes and some dark stools as well as BRBPR. He will need work up of this and I advised him to see his PCP for further eval

## 2020-04-17 ENCOUNTER — Encounter: Payer: Self-pay | Admitting: Family Medicine

## 2020-04-17 ENCOUNTER — Telehealth (INDEPENDENT_AMBULATORY_CARE_PROVIDER_SITE_OTHER): Payer: Medicare Other | Admitting: Family Medicine

## 2020-04-17 ENCOUNTER — Other Ambulatory Visit: Payer: Self-pay

## 2020-04-17 DIAGNOSIS — K625 Hemorrhage of anus and rectum: Secondary | ICD-10-CM | POA: Insufficient documentation

## 2020-04-17 DIAGNOSIS — R195 Other fecal abnormalities: Secondary | ICD-10-CM | POA: Insufficient documentation

## 2020-04-17 NOTE — Assessment & Plan Note (Signed)
See dark stools A/P

## 2020-04-17 NOTE — Patient Instructions (Signed)
As we discussed, have your labs completed and I will contact you with the results.  It is important to remember, if you have any worsening of symptoms, you begin to have any dizziness, lightheadedness, visual changes, chest pain, palpitations to proceed to the emergency room immediately.  We will plan to see you back in 1 week for re-evaluation of your dark stools and BRBPR  You will receive a survey after today's visit either digitally by e-mail or paper by USPS mail. Your experiences and feedback matter to [name].  Please respond so we know how we are doing as we provide care for you.  Call us with any questions/concerns/needs.  It is my goal to be available to you for your health concerns.  Thanks for choosing me to be a partner in your healthcare needs!  Harlin Rain, FNP-C Family Nurse Practitioner [facility] Phone: [phone]

## 2020-04-17 NOTE — Assessment & Plan Note (Signed)
Patient without dizziness/lightheadedness, visual changes, CP, palpitations.  Not in clinic today and unable to complete in person FOBT.  Discussed with patient having labs drawn to assess for anemia/platelet imbalances and stool sample for evaluation of blood.  Patient will proceed to [facility] for these to be completed.  Strict ER precautions discussed if having any worsening of symptoms of if begins to have any dizziness/lightheadedness, visual changes, CP, palpitations.  Pt verbalized understanding.  Plan: 1. Proceed to [facility] for labs and stool sampling 2. Proceed to ER if worsening of symptoms 3. Will f/u once receive results

## 2020-04-17 NOTE — Progress Notes (Addendum)
Virtual Visit via Telephone  The purpose of this virtual visit is to provide medical care while limiting exposure to the novel coronavirus (COVID19) for both patient and office staff.  Consent was obtained for phone visit:  Yes.   Answered questions that patient had about telehealth interaction:  Yes.   I discussed the limitations, risks, security and privacy concerns of performing an evaluation and management service by telephone. I also discussed with the patient that there may be a patient responsible charge related to this service. The patient expressed understanding and agreed to proceed.  Patient is at home and is accessed via telephone Services are provided by Harlin Rain, FNP-C from [facility])  ---------------------------------------------------------------------- Chief Complaint  Patient presents with  . Hot Flashes    pt had Transcatheter  Aortic valve replacement, transfemoral  on 6/29/201 and every since you notice dark stool, BRBPR only notice after he wipes , and hot flashes. He was off his blood thinner for 3 days and started back on the Plavix the following day of his procedure.     S: Reviewed CMA documentation. I have called patient and gathered additional HPI as follows:  Nathaniel Little presents for telemedicine visit.  Reports that he had cardiac surgery on 04/01/2020 and since then has started to have some dark stools with some BRBPR only after he wipes.  Denies any dizziness, lightheadedness, chest pain, palpitations, visual changes, abdominal pain, n/v/d.  Denies history of this in the past.  Has not taken anything for his symptoms.  Patient is currently home Denies any high risk travel to areas of current concern for COVID19. Denies any known or suspected exposure to person with or possibly with COVID19.  Denies any fevers, chills, sweats, body ache, cough, shortness of breath, sinus pain or pressure, headache, abdominal pain,  diarrhea  Past Medical History:  Diagnosis Date  . (HFpEF) heart failure with preserved ejection fraction ([place])    a. 08/2017 Echo: EF 60-65%, Gr1 DD.  [name] Arthritis   . COPD (chronic obstructive pulmonary disease) ([place])   . Depression   . Diabetes mellitus without complication ([place])   . Hypertension   . Incidental pulmonary nodule, greater than or equal to 83m 03/11/2020   Left lower lobe - suspicious for bronchogenic neoplasm  . Lymphedema   . Morbid obesity ([place]   . Prostate cancer ([place] 03/2013   ? PANCREATIC  . 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 Severe aortic stenosis   . Sleep apnea with use of continuous positive airway pressure (CPAP)   . Tobacco abuse   . Tremor    HEAD   Social History   Tobacco Use  . Smoking status: Current Some Day Smoker    Packs/day: 0.25    Years: 68.00    Pack years: 17.00    Types: Cigarettes  . Smokeless tobacco: Former [employer] developer   Types: Chew, SJohn Daydate: 2000  . Tobacco comment: 5 cigs daily 01/29/20/ER  Vaping Use  . Vaping Use: Never used  Substance Use Topics  . Alcohol use: Not Currently    Alcohol/week: 0.0 - 1.0 standard drinks    Comment: quit 40years ago  . Drug use: No    Current Outpatient Medications:  .  albuterol (PROVENTIL) (2.5 MG/3ML) 0.083% nebulizer solution, Take 2.5 mg by nebulization every  6 (six) hours as needed for wheezing or shortness of breath., Disp: , Rfl:  .  albuterol (VENTOLIN HFA) 108 (90 Base) MCG/ACT inhaler, Inhale 2 puffs into the lungs every 6 (six) hours as needed for wheezing or shortness of breath., Disp: 6.7 g, Rfl: 2 .  aspirin EC 81 MG tablet, Take 81 mg by mouth daily., Disp: , Rfl:  .  atorvastatin (LIPITOR) 80 MG tablet, Take 80 mg by mouth daily., Disp: , Rfl:  .  carvedilol (COREG) 3.125 MG tablet, Take 1 tablet (3.125 mg total) by mouth 2 (two) times daily., Disp:  60 tablet, Rfl: 5 .  cloNIDine (CATAPRES) 0.1 MG tablet, Take 0.1 mg by mouth 2 (two) times daily., Disp: , Rfl:  .  clopidogrel (PLAVIX) 75 MG tablet, TAKE 1 TABLET BY MOUTH ONCE DAILY (Patient taking differently: Take 75 mg by mouth daily. ), Disp: 90 tablet, Rfl: 0 .  diclofenac Sodium (VOLTAREN) 1 % GEL, Apply 2 g topically in the morning and at bedtime., Disp: , Rfl:  .  gabapentin (NEURONTIN) 100 MG capsule, Take 100-200 mg by mouth at bedtime. , Disp: , Rfl:  .  hydrochlorothiazide (HYDRODIURIL) 25 MG tablet, Take 1 tablet (25 mg total) by mouth daily., Disp: 30 tablet, Rfl: 5 .  insulin glargine (LANTUS) 100 unit/mL SOPN, Inject 20 Units into the skin at bedtime., Disp: , Rfl:  .  metFORMIN (GLUCOPHAGE) 500 MG tablet, Take 1,000 mg by mouth 2 (two) times daily. , Disp: , Rfl:  .  pantoprazole (PROTONIX) 40 MG tablet, Take 1 tablet (40 mg total) by mouth 2 (two) times daily., Disp: 60 tablet, Rfl: 11 .  SPIRIVA HANDIHALER 18 MCG inhalation capsule, INHALE CONTENTS OF 1 CAPSULE ONCE DAILY (Patient taking differently: Place 18 mcg into inhaler and inhale daily. ), Disp: 30 capsule, Rfl: 5 .  SYMBICORT 80-4.5 MCG/ACT inhaler, INHALE 2 PUFFS BY MOUTH TWICE DAILY (Patient taking differently: Inhale 2 puffs into the lungs 2 (two) times daily. ), Disp: 10.2 g, Rfl: 3  Depression screen [facility] 2/9 03/31/2020 12/04/2019 08/13/2019  Decreased Interest 0 0 0  Down, Depressed, Hopeless 0 1 1  PHQ - 2 Score 0 1 1  Altered sleeping - - 0  Tired, decreased energy - - 0  Change in appetite - - 0  Feeling bad or failure about yourself  - - 0  Trouble concentrating - - 0  Moving slowly or fidgety/restless - - 0  Suicidal thoughts - - 0  PHQ-9 Score - - 1  Difficult doing work/chores - - Not difficult at all    GAD 7 : Generalized Anxiety Score 08/13/2019  Nervous, Anxious, on Edge 1  Control/stop worrying 0  Worry too much - different things 0  Trouble relaxing 0  Restless 0  Easily annoyed or  irritable 1  Afraid - awful might happen 0  Total GAD 7 Score 2  Anxiety Difficulty Not difficult at all    -------------------------------------------------------------------------- O: No physical exam performed due to remote telephone encounter.  Physical Exam: Patient remotely monitored without video.  Verbal communication appropriate.  Cognition normal.  Recent Results (from the past 2160 hour(s))  Lipid panel     Status: Abnormal   Collection Time: 02/08/20 12:28 PM  Result Value Ref Range   Cholesterol, Total 149 100 - 199 mg/dL   Triglycerides 108 0 - 149 mg/dL   HDL 38 (L) >39 mg/dL   VLDL Cholesterol Cal 20 5 - 40 mg/dL   LDL  Chol Calc (NIH) 91 0 - 99 mg/dL   Chol/HDL Ratio 3.9 0.0 - 5.0 ratio    Comment:                                   T. Chol/HDL Ratio                                             Men  Women                               1/2 Avg.Risk  3.4    3.3                                   Avg.Risk  5.0    4.4                                2X Avg.Risk  9.6    7.1                                3X Avg.Risk 23.4   11.0   Comp Met (CMET)     Status: Abnormal   Collection Time: 02/08/20 12:28 PM  Result Value Ref Range   Glucose 139 (H) 65 - 99 mg/dL   BUN 20 8 - 27 mg/dL   Creatinine, Ser 0.99 0.76 - 1.27 mg/dL   GFR calc non Af Amer 75 >59 mL/min/1.73   GFR calc Af Amer 86 >59 mL/min/1.73    Comment: **[facility] currently reports eGFR in compliance with the current**   recommendations of the [employer]. [facility] will   update reporting as new guidelines are published from the NKF-ASN   Task force.    BUN/Creatinine Ratio 20 10 - 24   Sodium 142 134 - 144 mmol/L   Potassium 3.9 3.5 - 5.2 mmol/L   Chloride 105 96 - 106 mmol/L   CO2 22 20 - 29 mmol/L   Calcium 9.7 8.6 - 10.2 mg/dL   Total Protein 6.8 6.0 - 8.5 g/dL   Albumin 4.4 3.7 - 4.7 g/dL   Globulin, Total 2.4 1.5 - 4.5 g/dL   Albumin/Globulin Ratio 1.8 1.2 - 2.2   Bilirubin Total 0.5  0.0 - 1.2 mg/dL   Alkaline Phosphatase 101 39 - 117 IU/L   AST 39 0 - 40 IU/L   ALT 37 0 - 44 IU/L  CBC w/Diff     Status: Abnormal   Collection Time: 02/08/20 12:28 PM  Result Value Ref Range   WBC 7.3 3.4 - 10.8 x10E3/uL   RBC 4.64 4.14 - 5.80 x10E6/uL   Hemoglobin 11.5 (L) 13.0 - 17.7 g/dL   Hematocrit 37.2 (L) 37.5 - 51.0 %   MCV 80 79 - 97 fL   MCH 24.8 (L) 26.6 - 33.0 pg   MCHC 30.9 (L) 31 - 35 g/dL   RDW 16.9 (H) 11.6 - 15.4 %   Platelets 249 150 - 450 x10E3/uL   Neutrophils 78 Not Estab. %   Lymphs 15 Not Estab. %   Monocytes  6 Not Estab. %   Eos 1 Not Estab. %   Basos 0 Not Estab. %   Neutrophils Absolute 5.7 1 - 7 x10E3/uL   Lymphocytes Absolute 1.1 0 - 3 x10E3/uL   Monocytes Absolute 0.4 0 - 0 x10E3/uL   EOS (ABSOLUTE) 0.1 0.0 - 0.4 x10E3/uL   Basophils Absolute 0.0 0 - 0 x10E3/uL   Immature Granulocytes 0 Not Estab. %   Immature Grans (Abs) 0.0 0.0 - 0.1 R15Q0/GQ  Basic metabolic panel     Status: Abnormal   Collection Time: 02/18/20 11:03 AM  Result Value Ref Range   Sodium 137 135 - 145 mmol/L   Potassium 3.9 3.5 - 5.1 mmol/L   Chloride 101 98 - 111 mmol/L   CO2 26 22 - 32 mmol/L   Glucose, Bld 225 (H) 70 - 99 mg/dL    Comment: Glucose reference range applies only to samples taken after fasting for at least 8 hours.   BUN 23 8 - 23 mg/dL   Creatinine, Ser 0.75 0.61 - 1.24 mg/dL   Calcium 9.0 8.9 - 10.3 mg/dL   GFR calc non Af Amer >60 >60 mL/min   GFR calc Af Amer >60 >60 mL/min   Anion gap 10 5 - 15    Comment: Performed at [facility], [address] [place], [place] [address]  CBC     Status: Abnormal   Collection Time: 02/18/20 11:03 AM  Result Value Ref Range   WBC 16.2 (H) 4.0 - 10.5 K/uL   RBC 4.48 4.22 - 5.81 MIL/uL   Hemoglobin 11.1 (L) 13.0 - 17.0 g/dL   HCT 35.0 (L) 39 - 52 %   MCV 78.1 (L) 80.0 - 100.0 fL   MCH 24.8 (L) 26.0 - 34.0 pg   MCHC 31.7 30.0 - 36.0 g/dL   RDW 17.2 (H) 11.5 - 15.5 %   Platelets 232 150 - 400 K/uL    nRBC 0.0 0.0 - 0.2 %    Comment: Performed at [facility], [place], [place] [address]  Troponin I (High Sensitivity)     Status: Abnormal   Collection Time: 02/18/20 11:03 AM  Result Value Ref Range   Troponin I (High Sensitivity) 18 (H) <18 ng/L    Comment: (NOTE) Elevated high sensitivity troponin I (hsTnI) values and significant  changes across serial measurements may suggest ACS but many other  chronic and acute conditions are known to elevate hsTnI results.  Refer to the "Links" section for chest pain algorithms and additional  guidance. Performed at [facility], [place], [place] [address]   Troponin I (High Sensitivity)     Status: Abnormal   Collection Time: 02/18/20  1:40 PM  Result Value Ref Range   Troponin I (High Sensitivity) 19 (H) <18 ng/L    Comment: (NOTE) Elevated high sensitivity troponin I (hsTnI) values and significant  changes across serial measurements may suggest ACS but many other  chronic and acute conditions are known to elevate hsTnI results.  Refer to the "Links" section for chest pain algorithms and additional  guidance. Performed at [facility], [address] [place], [place] [address]   Lipase, blood     Status: None   Collection Time: 02/18/20  1:40 PM  Result Value Ref Range   Lipase 38 11 - 51 U/L    Comment: Performed at [facility], [address], [place], [place] [address]  Hepatic function panel  Status: None   Collection Time: 02/18/20  1:40 PM  Result Value Ref Range   Total Protein 7.2 6.5 - 8.1 g/dL   Albumin 4.0 3.5 - 5.0 g/dL   AST 32 15 - 41 U/L   ALT 34 0 - 44 U/L   Alkaline Phosphatase 82 38 - 126 U/L   Total Bilirubin 0.8 0.3 - 1.2 mg/dL   Bilirubin, Direct 0.1 0.0 - 0.2 mg/dL   Indirect Bilirubin 0.7 0.3 - 0.9 mg/dL    Comment: Performed at [facility], [address] [place], [place] [address]  Lactic acid, plasma     Status:  Abnormal   Collection Time: 02/18/20  2:47 PM  Result Value Ref Range   Lactic Acid, Venous 2.6 (HH) 0.5 - 1.9 mmol/L    Comment: CRITICAL RESULT CALLED TO, READ BACK BY AND VERIFIED WITH LORRIE LEMONS '@1552'$  02/18/20 MJU Performed at [facility], [address] [place], [place] [address]   Urinalysis, Complete w Microscopic     Status: Abnormal   Collection Time: 02/18/20  2:47 PM  Result Value Ref Range   Color, Urine YELLOW (A) YELLOW   APPearance CLEAR (A) CLEAR   Specific Gravity, Urine 1.025 1.005 - 1.030   pH 6.0 5.0 - 8.0   Glucose, UA 150 (A) NEGATIVE mg/dL   Hgb urine dipstick NEGATIVE NEGATIVE   Bilirubin Urine NEGATIVE NEGATIVE   Ketones, ur NEGATIVE NEGATIVE mg/dL   Protein, ur 100 (A) NEGATIVE mg/dL   Nitrite NEGATIVE NEGATIVE   Leukocytes,Ua NEGATIVE NEGATIVE   RBC / HPF 0-5 0 - 5 RBC/hpf   WBC, UA 0-5 0 - 5 WBC/hpf   Bacteria, UA NONE SEEN NONE SEEN   Squamous Epithelial / LPF 0-5 0 - 5   Mucus PRESENT     Comment: Performed at [facility], [address] [place], [place] [address]  SARS Coronavirus 2 by RT PCR (hospital order, performed in [place] hospital lab) Nasopharyngeal Nasopharyngeal Swab     Status: None   Collection Time: 02/18/20  2:47 PM   Specimen: Nasopharyngeal Swab  Result Value Ref Range   SARS Coronavirus 2 NEGATIVE NEGATIVE    Comment: (NOTE) SARS-CoV-2 target nucleic acids are NOT DETECTED. The SARS-CoV-2 RNA is generally detectable in upper and lower respiratory specimens during the acute phase of infection. The lowest concentration of SARS-CoV-2 viral copies this assay can detect is 250 copies / mL. A negative result does not preclude SARS-CoV-2 infection and should not be used as the sole basis for treatment or other patient management decisions.  A negative result may occur with improper specimen collection / handling, submission of specimen other than nasopharyngeal swab, presence of viral mutation(s) within  the areas targeted by this assay, and inadequate number of viral copies (<250 copies / mL). A negative result must be combined with clinical observations, patient history, and epidemiological information. Fact Sheet for Patients:   StrictlyIdeas.no Fact Sheet for Healthcare Providers: BankingDealers.co.za This test is not yet approved or cleared  by the [place] FDA and has been authorized for detection and/or diagnosis of SARS-CoV-2 by FDA under an Emergency Use Authorization (EUA).  This EUA will remain in effect (meaning this test can be used) for the duration of the COVID-19 declaration under Section 564(b)(1) of the Act, 21 U.S.C. section 360bbb-3(b)(1), unless the authorization is terminated or revoked sooner. Performed at [facility], [place], [place] [address]   Lactic acid, plasma  Status: Abnormal   Collection Time: 02/18/20  8:38 PM  Result Value Ref Range   Lactic Acid, Venous 2.3 (HH) 0.5 - 1.9 mmol/L    Comment: CRITICAL RESULT CALLED TO, READ BACK BY AND VERIFIED WITH CHRIS BUCKNER '@2116'$  02/18/20 MJU Performed at [facility], [address] [place], [place] [address]   Procalcitonin - Baseline     Status: None   Collection Time: 02/18/20  8:38 PM  Result Value Ref Range   Procalcitonin <0.10 ng/mL    Comment:        Interpretation: PCT (Procalcitonin) <= 0.5 ng/mL: Systemic infection (sepsis) is not likely. Local bacterial infection is possible. (NOTE)       Sepsis PCT Algorithm           Lower Respiratory Tract                                      Infection PCT Algorithm    ----------------------------     ----------------------------         PCT < 0.25 ng/mL                PCT < 0.10 ng/mL         Strongly encourage             Strongly discourage   discontinuation of antibiotics    initiation of antibiotics    ----------------------------      -----------------------------       PCT 0.25 - 0.50 ng/mL            PCT 0.10 - 0.25 ng/mL               OR       >80% decrease in PCT            Discourage initiation of                                            antibiotics      Encourage discontinuation           of antibiotics    ----------------------------     -----------------------------         PCT >= 0.50 ng/mL              PCT 0.26 - 0.50 ng/mL               AND        <80% decrease in PCT             Encourage initiation of                                             antibiotics       Encourage continuation           of antibiotics    ----------------------------     -----------------------------        PCT >= 0.50 ng/mL                  PCT > 0.50 ng/mL               AND  increase in PCT                  Strongly encourage                                      initiation of antibiotics    Strongly encourage escalation           of antibiotics                                     -----------------------------                                           PCT <= 0.25 ng/mL                                                 OR                                        > 80% decrease in PCT                                     Discontinue / Do not initiate                                             antibiotics Performed at [facility], [address] [place], [place] [address]   Glucose, capillary     Status: Abnormal   Collection Time: 02/18/20  9:54 PM  Result Value Ref Range   Glucose-Capillary 227 (H) 70 - 99 mg/dL    Comment: Glucose reference range applies only to samples taken after fasting for at least 8 hours.  Lactic acid, plasma     Status: Abnormal   Collection Time: 02/18/20 10:00 PM  Result Value Ref Range   Lactic Acid, Venous 2.1 (HH) 0.5 - 1.9 mmol/L    Comment: CRITICAL RESULT CALLED TO, READ BACK BY AND VERIFIED WITH CHRIS BUCKNER '@2242'$  02/18/20 AKT Performed at [facility], [address] [place], [place] [address]   Procalcitonin     Status: None   Collection Time: 02/19/20  5:17 AM  Result Value Ref Range   Procalcitonin <0.10 ng/mL    Comment:        Interpretation: PCT (Procalcitonin) <= 0.5 ng/mL: Systemic infection (sepsis) is not likely. Local bacterial infection is possible. (NOTE)       Sepsis PCT Algorithm           Lower Respiratory Tract                                      Infection PCT Algorithm    ----------------------------     ----------------------------  PCT < 0.25 ng/mL                PCT < 0.10 ng/mL         Strongly encourage             Strongly discourage   discontinuation of antibiotics    initiation of antibiotics    ----------------------------     -----------------------------       PCT 0.25 - 0.50 ng/mL            PCT 0.10 - 0.25 ng/mL               OR       >80% decrease in PCT            Discourage initiation of                                            antibiotics      Encourage discontinuation           of antibiotics    ----------------------------     -----------------------------         PCT >= 0.50 ng/mL              PCT 0.26 - 0.50 ng/mL               AND        <80% decrease in PCT             Encourage initiation of                                             antibiotics       Encourage continuation           of antibiotics    ----------------------------     -----------------------------        PCT >= 0.50 ng/mL                  PCT > 0.50 ng/mL               AND         increase in PCT                  Strongly encourage                                      initiation of antibiotics    Strongly encourage escalation           of antibiotics                                     -----------------------------                                           PCT <= 0.25 ng/mL  OR                                        > 80% decrease in PCT                                      Discontinue / Do not initiate                                             antibiotics Performed at [facility], [address] [place], [place] [address]   Basic metabolic panel     Status: Abnormal   Collection Time: 02/19/20  5:17 AM  Result Value Ref Range   Sodium 135 135 - 145 mmol/L   Potassium 3.7 3.5 - 5.1 mmol/L   Chloride 101 98 - 111 mmol/L   CO2 26 22 - 32 mmol/L   Glucose, Bld 162 (H) 70 - 99 mg/dL    Comment: Glucose reference range applies only to samples taken after fasting for at least 8 hours.   BUN 17 8 - 23 mg/dL   Creatinine, Ser 0.71 0.61 - 1.24 mg/dL   Calcium 8.7 (L) 8.9 - 10.3 mg/dL   GFR calc non Af Amer >60 >60 mL/min   GFR calc Af Amer >60 >60 mL/min   Anion gap 8 5 - 15    Comment: Performed at [facility], [address] [place], [place] [address]  CBC     Status: Abnormal   Collection Time: 02/19/20  5:17 AM  Result Value Ref Range   WBC 10.0 4.0 - 10.5 K/uL   RBC 4.32 4.22 - 5.81 MIL/uL   Hemoglobin 10.6 (L) 13.0 - 17.0 g/dL   HCT 33.8 (L) 39 - 52 %   MCV 78.2 (L) 80.0 - 100.0 fL   MCH 24.5 (L) 26.0 - 34.0 pg   MCHC 31.4 30.0 - 36.0 g/dL   RDW 17.1 (H) 11.5 - 15.5 %   Platelets 215 150 - 400 K/uL   nRBC 0.0 0.0 - 0.2 %    Comment: Performed at [facility], [address], [place], [place] [address]  Magnesium     Status: Abnormal   Collection Time: 02/19/20  5:17 AM  Result Value Ref Range   Magnesium 1.4 (L) 1.7 - 2.4 mg/dL    Comment: Performed at [facility], [address] [place], [place] [address]  Glucose, capillary     Status: Abnormal   Collection Time: 02/19/20  7:36 AM  Result Value Ref Range   Glucose-Capillary 141 (H) 70 - 99 mg/dL    Comment: Glucose reference range applies only to samples taken after fasting for at least 8 hours.  Glucose, capillary     Status: Abnormal   Collection Time: 02/19/20 11:35 AM  Result Value Ref Range   Glucose-Capillary 223 (H) 70 - 99  mg/dL    Comment: Glucose reference range applies only to samples taken after fasting for at least 8 hours.   Comment 1 Notify RN   Glucose, capillary     Status: Abnormal   Collection Time: 02/19/20  4:32 PM  Result Value Ref Range   Glucose-Capillary 153 (H)  70 - 99 mg/dL    Comment: Glucose reference range applies only to samples taken after fasting for at least 8 hours.   Comment 1 Notify RN   Glucose, capillary     Status: Abnormal   Collection Time: 02/19/20  9:44 PM  Result Value Ref Range   Glucose-Capillary 273 (H) 70 - 99 mg/dL    Comment: Glucose reference range applies only to samples taken after fasting for at least 8 hours.  Basic metabolic panel     Status: Abnormal   Collection Time: 02/20/20  5:45 AM  Result Value Ref Range   Sodium 138 135 - 145 mmol/L   Potassium 3.5 3.5 - 5.1 mmol/L   Chloride 104 98 - 111 mmol/L   CO2 27 22 - 32 mmol/L   Glucose, Bld 134 (H) 70 - 99 mg/dL    Comment: Glucose reference range applies only to samples taken after fasting for at least 8 hours.   BUN 11 8 - 23 mg/dL   Creatinine, Ser 0.84 0.61 - 1.24 mg/dL   Calcium 8.5 (L) 8.9 - 10.3 mg/dL   GFR calc non Af Amer >60 >60 mL/min   GFR calc Af Amer >60 >60 mL/min   Anion gap 7 5 - 15    Comment: Performed at [facility], [address] [place], [place] [address]  CBC     Status: Abnormal   Collection Time: 02/20/20  5:45 AM  Result Value Ref Range   WBC 7.0 4.0 - 10.5 K/uL   RBC 4.13 (L) 4.22 - 5.81 MIL/uL   Hemoglobin 10.1 (L) 13.0 - 17.0 g/dL   HCT 32.3 (L) 39 - 52 %   MCV 78.2 (L) 80.0 - 100.0 fL   MCH 24.5 (L) 26.0 - 34.0 pg   MCHC 31.3 30.0 - 36.0 g/dL   RDW 17.1 (H) 11.5 - 15.5 %   Platelets 199 150 - 400 K/uL   nRBC 0.0 0.0 - 0.2 %    Comment: Performed at [facility], [address] [place], [place] [address]  Magnesium     Status: None   Collection Time: 02/20/20  5:45 AM  Result Value Ref Range   Magnesium 2.0 1.7 - 2.4 mg/dL    Comment:  Performed at [facility], [address] [place], [place] [address]  Lactic acid, plasma     Status: None   Collection Time: 02/20/20  5:45 AM  Result Value Ref Range   Lactic Acid, Venous 1.0 0.5 - 1.9 mmol/L    Comment: Performed at [facility], [address] [place],  [address]  Glucose, capillary     Status: Abnormal   Collection Time: 02/20/20  7:42 AM  Result Value Ref Range   Glucose-Capillary 147 (H) 70 - 99 mg/dL    Comment: Glucose reference range applies only to samples taken after fasting for at least 8 hours.   Comment 1 Notify RN   Glucose, capillary     Status: Abnormal   Collection Time: 02/20/20 11:35 AM  Result Value Ref Range   Glucose-Capillary 256 (H) 70 - 99 mg/dL    Comment: Glucose reference range applies only to samples taken after fasting for at least 8 hours.   Comment 1 Notify RN   Urinalysis, Routine w reflex microscopic     Status: Abnormal   Collection Time: 02/20/20  4:41 PM  Result Value Ref Range   Color, Urine YELLOW (A) YELLOW   APPearance CLEAR (  A) CLEAR   Specific Gravity, Urine 1.025 1.005 - 1.030   pH 6.0 5.0 - 8.0   Glucose, UA >=500 (A) NEGATIVE mg/dL   Hgb urine dipstick NEGATIVE NEGATIVE   Bilirubin Urine NEGATIVE NEGATIVE   Ketones, ur NEGATIVE NEGATIVE mg/dL   Protein, ur NEGATIVE NEGATIVE mg/dL   Nitrite NEGATIVE NEGATIVE   Leukocytes,Ua NEGATIVE NEGATIVE   RBC / HPF 0-5 0 - 5 RBC/hpf   WBC, UA 0-5 0 - 5 WBC/hpf   Bacteria, UA NONE SEEN NONE SEEN   Squamous Epithelial / LPF 0-5 0 - 5   Mucus PRESENT     Comment: Performed at [facility], [address] [place], [place] [address]  Glucose, capillary     Status: Abnormal   Collection Time: 02/20/20  4:52 PM  Result Value Ref Range   Glucose-Capillary 129 (H) 70 - 99 mg/dL    Comment: Glucose reference range applies only to samples taken after fasting for at least 8 hours.   Comment 1 Notify RN   Glucose, capillary     Status: Abnormal    Collection Time: 02/20/20  9:35 PM  Result Value Ref Range   Glucose-Capillary 251 (H) 70 - 99 mg/dL    Comment: Glucose reference range applies only to samples taken after fasting for at least 8 hours.  Basic metabolic panel     Status: Abnormal   Collection Time: 02/21/20  4:27 AM  Result Value Ref Range   Sodium 138 135 - 145 mmol/L   Potassium 3.7 3.5 - 5.1 mmol/L   Chloride 104 98 - 111 mmol/L   CO2 26 22 - 32 mmol/L   Glucose, Bld 220 (H) 70 - 99 mg/dL    Comment: Glucose reference range applies only to samples taken after fasting for at least 8 hours.   BUN 10 8 - 23 mg/dL   Creatinine, Ser 0.92 0.61 - 1.24 mg/dL   Calcium 8.9 8.9 - 10.3 mg/dL   GFR calc non Af Amer >60 >60 mL/min   GFR calc Af Amer >60 >60 mL/min   Anion gap 8 5 - 15    Comment: Performed at [facility], [address] [place], [place] [address]  CBC     Status: Abnormal   Collection Time: 02/21/20  4:27 AM  Result Value Ref Range   WBC 7.5 4.0 - 10.5 K/uL   RBC 3.97 (L) 4.22 - 5.81 MIL/uL   Hemoglobin 9.9 (L) 13.0 - 17.0 g/dL   HCT 30.4 (L) 39 - 52 %   MCV 76.6 (L) 80.0 - 100.0 fL   MCH 24.9 (L) 26.0 - 34.0 pg   MCHC 32.6 30.0 - 36.0 g/dL   RDW 17.2 (H) 11.5 - 15.5 %   Platelets 197 150 - 400 K/uL   nRBC 0.0 0.0 - 0.2 %    Comment: Performed at [facility], [address], [place], [place] [address]  Magnesium     Status: None   Collection Time: 02/21/20  4:27 AM  Result Value Ref Range   Magnesium 1.8 1.7 - 2.4 mg/dL    Comment: Performed at [facility], [address] [place], [place] [address]  Glucose, capillary     Status: Abnormal   Collection Time: 02/21/20  7:58 AM  Result Value Ref Range   Glucose-Capillary 146 (H) 70 - 99 mg/dL    Comment: Glucose reference range applies only to samples taken after fasting for at least 8 hours.  Glucose, capillary     Status: Abnormal   Collection Time: 02/21/20  8:55 AM  Result Value Ref Range   Glucose-Capillary  159 (H) 70 - 99 mg/dL    Comment: Glucose reference range applies only to samples taken after fasting for at least 8 hours.  Glucose, capillary     Status: Abnormal   Collection Time: 02/21/20 11:52 AM  Result Value Ref Range   Glucose-Capillary 149 (H) 70 - 99 mg/dL    Comment: Glucose reference range applies only to samples taken after fasting for at least 8 hours.  Glucose, capillary     Status: Abnormal   Collection Time: 02/21/20 12:05 PM  Result Value Ref Range   Glucose-Capillary 135 (H) 70 - 99 mg/dL    Comment: Glucose reference range applies only to samples taken after fasting for at least 8 hours.  Glucose, capillary     Status: Abnormal   Collection Time: 02/21/20  4:38 PM  Result Value Ref Range   Glucose-Capillary 233 (H) 70 - 99 mg/dL    Comment: Glucose reference range applies only to samples taken after fasting for at least 8 hours.  Glucose, capillary     Status: Abnormal   Collection Time: 03/17/20 11:17 AM  Result Value Ref Range   Glucose-Capillary 143 (H) 70 - 99 mg/dL    Comment: Glucose reference range applies only to samples taken after fasting for at least 8 hours.  Glucose, capillary     Status: Abnormal   Collection Time: 03/28/20  8:41 AM  Result Value Ref Range   Glucose-Capillary 152 (H) 70 - 99 mg/dL    Comment: Glucose reference range applies only to samples taken after fasting for at least 8 hours.  APTT     Status: Abnormal   Collection Time: 03/28/20  9:43 AM  Result Value Ref Range   aPTT 39 (H) 24 - 36 seconds    Comment:        IF BASELINE aPTT IS ELEVATED, SUGGEST PATIENT RISK ASSESSMENT BE USED TO DETERMINE APPROPRIATE ANTICOAGULANT THERAPY. Performed at [facility], [address], [place], [place] [address]   Brain natriuretic peptide     Status: None   Collection Time: 03/28/20  9:43 AM  Result Value Ref Range   B Natriuretic Peptide 79.9 0.0 - 100.0 pg/mL    Comment: Performed at [facility], [address],  [place], [place] [address]  CBC     Status: Abnormal   Collection Time: 03/28/20  9:43 AM  Result Value Ref Range   WBC 11.6 (H) 4.0 - 10.5 K/uL   RBC 4.61 4.22 - 5.81 MIL/uL   Hemoglobin 11.4 (L) 13.0 - 17.0 g/dL   HCT 24.9 (L) 39 - 52 %   MCV 80.3 80.0 - 100.0 fL   MCH 24.7 (L) 26.0 - 34.0 pg   MCHC 30.8 30.0 - 36.0 g/dL   RDW 27.6 (H) 47.9 - 38.1 %   Platelets 261 150 - 400 K/uL   nRBC 0.0 0.0 - 0.2 %    Comment: Performed at [facility], [address], [place], [place] [address]  Comprehensive metabolic panel     Status: Abnormal   Collection Time: 03/28/20  9:43 AM  Result Value Ref Range   Sodium 138 135 - 145 mmol/L   Potassium 3.9 3.5 - 5.1 mmol/L   Chloride 105 98 - 111 mmol/L   CO2 22 22 - 32 mmol/L   Glucose,  Bld 148 (H) 70 - 99 mg/dL    Comment: Glucose reference range applies only to samples taken after fasting for at least 8 hours.   BUN 18 8 - 23 mg/dL   Creatinine, Ser 0.82 0.61 - 1.24 mg/dL   Calcium 9.2 8.9 - 10.3 mg/dL   Total Protein 6.5 6.5 - 8.1 g/dL   Albumin 3.5 3.5 - 5.0 g/dL   AST 29 15 - 41 U/L   ALT 30 0 - 44 U/L   Alkaline Phosphatase 98 38 - 126 U/L   Total Bilirubin 0.5 0.3 - 1.2 mg/dL   GFR calc non Af Amer >60 >60 mL/min   GFR calc Af Amer >60 >60 mL/min   Anion gap 11 5 - 15    Comment: Performed at [facility], [place] [address], [place], [place] [address]  Hemoglobin A1c     Status: Abnormal   Collection Time: 03/28/20  9:43 AM  Result Value Ref Range   Hgb A1c MFr Bld 8.5 (H) 4.8 - 5.6 %    Comment: (NOTE) Pre diabetes:          5.7%-6.4%  Diabetes:              >6.4%  Glycemic control for   <7.0% adults with diabetes    Mean Plasma Glucose 197.25 mg/dL    Comment: Performed at [facility] [address] [place], [place] [address]  Protime-INR     Status: None   Collection Time: 03/28/20  9:43 AM  Result Value Ref Range   Prothrombin Time 13.1 11.4 - 15.2 seconds   INR 1.0 0.8 - 1.2    Comment: (NOTE) INR  goal varies based on device and disease states. Performed at [facility], [place] [address] [place], [place] [address]   Surgical pcr screen     Status: None   Collection Time: 03/28/20  9:43 AM   Specimen: Nasal Mucosa; Nasal Swab  Result Value Ref Range   MRSA, PCR NEGATIVE NEGATIVE   Staphylococcus aureus NEGATIVE NEGATIVE    Comment: (NOTE) The Xpert SA Assay (FDA approved for NASAL specimens in patients 32 years of age and older), is one component of a comprehensive surveillance program. It is not intended to diagnose infection nor to guide or monitor treatment. Performed at [facility], [place] [address], [place], [place] [address]   Urinalysis, Routine w reflex microscopic     Status: Abnormal   Collection Time: 03/28/20  9:44 AM  Result Value Ref Range   Color, Urine YELLOW YELLOW   APPearance CLEAR CLEAR   Specific Gravity, Urine 1.021 1.005 - 1.030   pH 5.0 5.0 - 8.0   Glucose, UA NEGATIVE NEGATIVE mg/dL   Hgb urine dipstick NEGATIVE NEGATIVE   Bilirubin Urine NEGATIVE NEGATIVE   Ketones, ur NEGATIVE NEGATIVE mg/dL   Protein, ur 30 (A) NEGATIVE mg/dL   Nitrite NEGATIVE NEGATIVE   Leukocytes,Ua NEGATIVE NEGATIVE   RBC / HPF 0-5 0 - 5 RBC/hpf   WBC, UA 0-5 0 - 5 WBC/hpf   Bacteria, UA NONE SEEN NONE SEEN   Squamous Epithelial / LPF 0-5 0 - 5   Mucus PRESENT     Comment: Performed at [facility], [place] [address], [place], [place] [address]  Type and screen     Status: None   Collection Time: 03/28/20 10:00 AM  Result Value Ref Range   ABO/RH(D) O POS    Antibody  Screen NEG    Sample Expiration 04/11/2020,2359    Extend sample reason      NO TRANSFUSIONS OR PREGNANCY IN THE PAST 3 MONTHS Performed at [facility], [address], [place], [place] [address]   ABO/Rh     Status: None   Collection Time: 03/28/20 10:00 AM  Result Value Ref Range   ABO/RH(D)      O POS Performed at [facility] [address] [place], [place]  [address]   SARS CORONAVIRUS 2 (TAT 6-24 HRS) Nasopharyngeal Nasopharyngeal Swab     Status: None   Collection Time: 03/28/20  2:32 PM   Specimen: Nasopharyngeal Swab  Result Value Ref Range   SARS Coronavirus 2 NEGATIVE NEGATIVE    Comment: (NOTE) SARS-CoV-2 target nucleic acids are NOT DETECTED.  The SARS-CoV-2 RNA is generally detectable in upper and lower respiratory specimens during the acute phase of infection. Negative results do not preclude SARS-CoV-2 infection, do not rule out co-infections with other pathogens, and should not be used as the sole basis for treatment or other patient management decisions. Negative results must be combined with clinical observations, patient history, and epidemiological information. The expected result is Negative.  Fact Sheet for Patients: SugarRoll.be  Fact Sheet for Healthcare Providers: https://www.woods-mathews.com/  This test is not yet approved or cleared by the [place] FDA and  has been authorized for detection and/or diagnosis of SARS-CoV-2 by FDA under an Emergency Use Authorization (EUA). This EUA will remain  in effect (meaning this test can be used) for the duration of the COVID-19 declaration under Se ction 564(b)(1) of the Act, 21 U.S.C. section 360bbb-3(b)(1), unless the authorization is terminated or revoked sooner.  Performed at [facility], [place] [address], [place], [place] [address]   Glucose, capillary     Status: Abnormal   Collection Time: 04/01/20  7:14 AM  Result Value Ref Range   Glucose-Capillary 153 (H) 70 - 99 mg/dL    Comment: Glucose reference range applies only to samples taken after fasting for at least 8 hours.   Comment 1 Notify RN   Blood gas, arterial     Status: Abnormal   Collection Time: 04/01/20  8:28 AM  Result Value Ref Range   FIO2 21.00    pH, Arterial 7.348 (L) 7.35 - 7.45   pCO2 arterial 48.3 (H) 32 - 48 mmHg   pO2, Arterial 96.2 83 -  108 mmHg   Bicarbonate 25.9 20.0 - 28.0 mmol/L   Acid-Base Excess 0.9 0.0 - 2.0 mmol/L   O2 Saturation 97.0 %   Patient temperature 37.0    Collection site RIGHT RADIAL    Drawn by  [facility] CRNA    Sample type ARTERIAL DRAW    Allens test (pass/fail) PASS PASS    Comment: Performed at [facility], [place] [address] [place], [place] [address]  POCT Activated clotting time     Status: None   Collection Time: 04/01/20  9:44 AM  Result Value Ref Range   Activated Clotting Time 132 seconds  I-STAT 7, (LYTES, BLD GAS, ICA, H+H)     Status: Abnormal   Collection Time: 04/01/20  9:46 AM  Result Value Ref Range   pH, Arterial 7.321 (L) 7.35 - 7.45   pCO2 arterial 50.4 (H) 32 - 48 mmHg   pO2, Arterial 195 (H) 83 - 108 mmHg   Bicarbonate 26.0 20.0 - 28.0 mmol/L   TCO2 28 22 - 32  mmol/L   O2 Saturation 100.0 %   Acid-base deficit 1.0 0.0 - 2.0 mmol/L   Sodium 141 135 - 145 mmol/L   Potassium 3.2 (L) 3.5 - 5.1 mmol/L   Calcium, Ion 1.28 1.15 - 1.40 mmol/L   HCT 32.0 (L) 39 - 52 %   Hemoglobin 10.9 (L) 13.0 - 17.0 g/dL   Sample type ARTERIAL   I-STAT, chem 8     Status: Abnormal   Collection Time: 04/01/20  9:49 AM  Result Value Ref Range   Sodium 141 135 - 145 mmol/L   Potassium 3.2 (L) 3.5 - 5.1 mmol/L   Chloride 102 98 - 111 mmol/L   BUN 12 8 - 23 mg/dL   Creatinine, Ser 0.70 0.61 - 1.24 mg/dL   Glucose, Bld 136 (H) 70 - 99 mg/dL    Comment: Glucose reference range applies only to samples taken after fasting for at least 8 hours.   Calcium, Ion 1.28 1.15 - 1.40 mmol/L   TCO2 25 22 - 32 mmol/L   Hemoglobin 11.2 (L) 13.0 - 17.0 g/dL   HCT 33.0 (L) 39 - 52 %  POCT Activated clotting time     Status: None   Collection Time: 04/01/20 10:28 AM  Result Value Ref Range   Activated Clotting Time 257 seconds  I-STAT, chem 8     Status: Abnormal   Collection Time: 04/01/20 10:29 AM  Result Value Ref Range   Sodium 139 135 - 145 mmol/L   Potassium 3.4 (L) 3.5 - 5.1 mmol/L    Chloride 101 98 - 111 mmol/L   BUN 13 8 - 23 mg/dL   Creatinine, Ser 0.70 0.61 - 1.24 mg/dL   Glucose, Bld 153 (H) 70 - 99 mg/dL    Comment: Glucose reference range applies only to samples taken after fasting for at least 8 hours.   Calcium, Ion 1.28 1.15 - 1.40 mmol/L   TCO2 26 22 - 32 mmol/L   Hemoglobin 9.9 (L) 13.0 - 17.0 g/dL   HCT 29.0 (L) 39 - 52 %  POCT Activated clotting time     Status: None   Collection Time: 04/01/20 10:51 AM  Result Value Ref Range   Activated Clotting Time 110 seconds  I-STAT, chem 8     Status: Abnormal   Collection Time: 04/01/20 11:37 AM  Result Value Ref Range   Sodium 140 135 - 145 mmol/L   Potassium 3.4 (L) 3.5 - 5.1 mmol/L   Chloride 101 98 - 111 mmol/L   BUN 13 8 - 23 mg/dL   Creatinine, Ser 0.70 0.61 - 1.24 mg/dL   Glucose, Bld 137 (H) 70 - 99 mg/dL    Comment: Glucose reference range applies only to samples taken after fasting for at least 8 hours.   Calcium, Ion 1.26 1.15 - 1.40 mmol/L   TCO2 28 22 - 32 mmol/L   Hemoglobin 10.2 (L) 13.0 - 17.0 g/dL   HCT 30.0 (L) 39 - 52 %  Glucose, capillary     Status: Abnormal   Collection Time: 04/01/20  1:00 PM  Result Value Ref Range   Glucose-Capillary 124 (H) 70 - 99 mg/dL    Comment: Glucose reference range applies only to samples taken after fasting for at least 8 hours.   Comment 1 Notify RN   Glucose, capillary     Status: Abnormal   Collection Time: 04/01/20  3:54 PM  Result Value Ref Range   Glucose-Capillary 163 (H) 70 - 99 mg/dL  Comment: Glucose reference range applies only to samples taken after fasting for at least 8 hours.   Comment 1 Notify RN   Glucose, capillary     Status: Abnormal   Collection Time: 04/01/20  9:19 PM  Result Value Ref Range   Glucose-Capillary 174 (H) 70 - 99 mg/dL    Comment: Glucose reference range applies only to samples taken after fasting for at least 8 hours.  Glucose, capillary     Status: Abnormal   Collection Time: 04/02/20  6:15 AM  Result  Value Ref Range   Glucose-Capillary 132 (H) 70 - 99 mg/dL    Comment: Glucose reference range applies only to samples taken after fasting for at least 8 hours.  CBC     Status: Abnormal   Collection Time: 04/02/20  6:26 AM  Result Value Ref Range   WBC 9.3 4.0 - 10.5 K/uL   RBC 4.09 (L) 4.22 - 5.81 MIL/uL   Hemoglobin 10.3 (L) 13.0 - 17.0 g/dL   HCT 32.8 (L) 39 - 52 %   MCV 80.2 80.0 - 100.0 fL   MCH 25.2 (L) 26.0 - 34.0 pg   MCHC 31.4 30.0 - 36.0 g/dL   RDW 18.2 (H) 11.5 - 15.5 %   Platelets 181 150 - 400 K/uL   nRBC 0.0 0.0 - 0.2 %    Comment: Performed at [facility] [address] [place], [place] [address]  Basic metabolic panel     Status: Abnormal   Collection Time: 04/02/20  6:26 AM  Result Value Ref Range   Sodium 138 135 - 145 mmol/L   Potassium 3.5 3.5 - 5.1 mmol/L   Chloride 105 98 - 111 mmol/L   CO2 25 22 - 32 mmol/L   Glucose, Bld 127 (H) 70 - 99 mg/dL    Comment: Glucose reference range applies only to samples taken after fasting for at least 8 hours.   BUN 12 8 - 23 mg/dL   Creatinine, Ser 0.91 0.61 - 1.24 mg/dL   Calcium 8.6 (L) 8.9 - 10.3 mg/dL   GFR calc non Af Amer >60 >60 mL/min   GFR calc Af Amer >60 >60 mL/min   Anion gap 8 5 - 15    Comment: Performed at [facility] [address] [place], [place] [address]  Magnesium     Status: None   Collection Time: 04/02/20  6:26 AM  Result Value Ref Range   Magnesium 1.7 1.7 - 2.4 mg/dL    Comment: Performed at [facility] [address] [place], [place] [address]  Glucose, capillary     Status: Abnormal   Collection Time: 04/02/20 11:15 AM  Result Value Ref Range   Glucose-Capillary 254 (H) 70 - 99 mg/dL    Comment: Glucose reference range applies only to samples taken after fasting for at least 8 hours.  ECHOCARDIOGRAM LIMITED     Status: None   Collection Time: 04/02/20 11:55 AM  Result Value Ref Range   Weight 3,393.32 oz   Height 69 in   BP 139/68 mmHg     -------------------------------------------------------------------------- A&P:  Problem List Items Addressed This Visit      Digestive   BRBPR (bright red blood per rectum)    See dark stools A/P      Relevant Orders   CBC with Differential   CMP14+EGFR   Fecal occult blood, imunochemical([facility])     Other   Dark stools - Primary  Patient without dizziness/lightheadedness, visual changes, CP, palpitations.  Not in clinic today and unable to complete in person FOBT.  Discussed with patient having labs drawn to assess for anemia/platelet imbalances and stool sample for evaluation of blood.  Patient will proceed to [facility] for these to be completed.  Strict ER precautions discussed if having any worsening of symptoms of if begins to have any dizziness/lightheadedness, visual changes, CP, palpitations.  Pt verbalized understanding.  Plan: 1. Proceed to [facility] for labs and stool sampling 2. Proceed to ER if worsening of symptoms 3. Will f/u once receive results      Relevant Orders   CBC with Differential   CMP14+EGFR   Fecal occult blood, imunochemical([facility])      No orders of the defined types were placed in this encounter.   Follow-up: - Return after lab results are received  Patient verbalizes understanding with the above medical recommendations including the limitation of remote medical advice.  Specific follow-up and call-back criteria were given for patient to follow-up or seek medical care more urgently if needed.  - Time spent in direct consultation with patient on phone: 12 minutes  Harlin Rain, [facility] 04/17/2020, 10:19 AM

## 2020-04-20 NOTE — Telephone Encounter (Signed)
Pam can we call him and find out why he is not taking his medications as prescribed Very important post valve that he is on aspirin Plavix

## 2020-04-21 NOTE — Telephone Encounter (Signed)
Left voicemail message to call back  

## 2020-04-23 NOTE — Telephone Encounter (Signed)
Left voicemail message for patient to call back.

## 2020-04-28 ENCOUNTER — Telehealth: Payer: Self-pay | Admitting: Licensed Clinical Social Worker

## 2020-04-28 NOTE — Telephone Encounter (Signed)
CSW left message for patient to return call to confirm need for transport to appointment on Thursday. CSW will arrange upon return call from patient. Raquel Sarna, [place], [place]

## 2020-04-29 ENCOUNTER — Telehealth: Payer: Self-pay | Admitting: Licensed Clinical Social Worker

## 2020-04-29 NOTE — Telephone Encounter (Signed)
CSW received call from pt requesting help with transportation to echo and Cardiology appt this week- coworker had already tried to reach out but had been unable to reach yesterday.    CSW set up transport for pt to get to appts using [facility] [employer] services and provided pt with their number to contact when he is done with his appt.  Jorge Ny, LCSW Clinical Social Worker [facility] Desk#: [phone] Cell#: [phone]

## 2020-05-01 ENCOUNTER — Other Ambulatory Visit: Payer: Self-pay | Admitting: Physician Assistant

## 2020-05-01 ENCOUNTER — Encounter: Payer: Self-pay | Admitting: Physician Assistant

## 2020-05-01 ENCOUNTER — Ambulatory Visit (INDEPENDENT_AMBULATORY_CARE_PROVIDER_SITE_OTHER): Payer: Medicare Other | Admitting: Physician Assistant

## 2020-05-01 ENCOUNTER — Ambulatory Visit (HOSPITAL_COMMUNITY): Payer: Medicare Other | Attending: Physician Assistant

## 2020-05-01 ENCOUNTER — Other Ambulatory Visit: Payer: Self-pay

## 2020-05-01 VITALS — BP 138/56 | HR 73 | Ht 69.0 in | Wt 214.8 lb

## 2020-05-01 DIAGNOSIS — I700 Atherosclerosis of aorta: Secondary | ICD-10-CM

## 2020-05-01 DIAGNOSIS — I10 Essential (primary) hypertension: Secondary | ICD-10-CM

## 2020-05-01 DIAGNOSIS — Z952 Presence of prosthetic heart valve: Secondary | ICD-10-CM

## 2020-05-01 DIAGNOSIS — R911 Solitary pulmonary nodule: Secondary | ICD-10-CM | POA: Diagnosis not present

## 2020-05-01 DIAGNOSIS — Z720 Tobacco use: Secondary | ICD-10-CM

## 2020-05-01 DIAGNOSIS — I739 Peripheral vascular disease, unspecified: Secondary | ICD-10-CM

## 2020-05-01 LAB — ECHOCARDIOGRAM COMPLETE
AR max vel: 1.27 cm2
AV Area VTI: 1.42 cm2
AV Area mean vel: 1.47 cm2
AV Mean grad: 16 mmHg
AV Peak grad: 30.9 mmHg
Ao pk vel: 2.78 m/s
Area-P 1/2: 2.61 cm2
S' Lateral: 2.9 cm

## 2020-05-01 MED ORDER — CLOPIDOGREL BISULFATE 75 MG PO TABS: 75.0000 mg | ORAL_TABLET | Freq: Every day | ORAL | 0 refills | Status: DC

## 2020-05-01 NOTE — Progress Notes (Signed)
[facility] [place]                                       Cardiology Office Note    Date:  05/02/2020   ID:  Nathaniel Little, DOB 1946-03-12, MRN [id number]  PCP:  Verl Bangs, FNP  Cardiologist:  Ida Rogue, MD/ Nathaniel Little & Dr. Roxy Manns (TAVR)  CC: 1 month s/p TAVR  History of Present Illness:  Nathaniel Little is a 74 y.o. male with a history of  chronic diastolicCHF, longstanding tobacco abuse with COPD, type 2 diabetes mellitus without complications,HTN, left lower lobe pulmonary nodule suspicious for bronchogenic neoplasm,OSAon CPAP, obesity, and intraductal papillary mucinous neoplasm of the pancreas s/pWhipple procedure in 2019 and severe AS s/p TAVR (04/01/20) who presents to clinic for follow up.   Patient was first evaluated by a cardiologist in 2018 for symptoms of exertional shortness of breath. Stress test was felt to be low risk for coronary ischemia and echocardiogram revealed normal left ventricular systolic function with moderate aortic stenosis. Symptoms were attributed to heart failure with preserved ejection fraction and COPD.   He underwent Whipple procedure in 07/2018 at [facility] for intraductal papillary mucinous neoplasm of the pancreas. The common and proper hepatic arteries were found to be dissected and occluded during surgery and ultimately the patient underwent stent placement in the gastroduodenal artery and hepatic arteries. He was started on Plavix at that time.  The patient underwent follow-up echocardiogram Feb 07, 2020 revealing significant progression in the severity of aortic stenosis with preserved left ventricular systolic function. Peak velocity across aortic valve was measured 4.1 m/s corresponding to mean transvalvular gradient estimated 37 mmHg. DVI was reported 0.23 and aortic valve area calculated 0.85 cm by VTI. Left ventricular systolic function remain preserved with  ejection fraction estimated 65%. The patient was referred to the multidisciplinary heart valve clinic for management. Shortly after that the patient was hospitalized briefly at [facility] with abdominal pain, elevated white blood count, and mild lactic acidosis. CT scan of the abdomen and pelvis revealed "considerable peripancreatic and duodenal inflammatory change consistent with some acute pancreatitis or duodenitis.The patient was treated with ciprofloxacin and Flagyl and symptoms resolved. During his hospitalization he was evaluated by cardiology and he underwent diagnostic cardiac catheterization by Dr. Rockey Situ on Feb 21, 2020. Catheterization revealed nonobstructive coronary artery disease with normal left ventricular systolic function and severe aortic stenosis. Mean transvalvular gradient across the aortic valve was measured 45 mmHg corresponding to valve area calculated 0.95 cm. There was very mild elevation of right-sided pressures. Patient was seen in consultation by Nathaniel Little and CT angiography was performed. CT scan of the chest revealed small pulmonary nodule in the left lower lobe suspicious for bronchogenic neoplasm. Follow up PET scan on 03/17/20 showedmild hypermetabolism, raising concern for primary bronchogenic neoplasm.There were nofindings suspicious for metastatic disease. Additionally, there was R74 mm hypermetabolic lesion in the left posterior parotid gland, suggesting benign or malignant parotid neoplasm, possibly Warthin's tumor.  He was evaluated by the multidisciplinary valve team and underwent successful TAVR with a57mm Edwards Sapien 3 THV via the TF approach on 04/01/20. Post operative showed EF 60%, normally functioning TAVR with a mean gradient of 14 mmHg and trivial PVL.He was discharged on continued aspirin and plavix.   Today he presents to clinic for follow up. Feels a lot more  energy and improvement in breathing. No CP or SOB. No LE edema, orthopnea or PND. No  dizziness or syncope. No blood in stool or urine. No palpitations.     Past Medical History:  Diagnosis Date  . (HFpEF) heart failure with preserved ejection fraction ([place])    a. 08/2017 Echo: EF 60-65%, Gr1 DD.  [name] Arthritis   . COPD (chronic obstructive pulmonary disease) ([place])   . Depression   . Diabetes mellitus without complication ([place])   . Hypertension   . Incidental pulmonary nodule, greater than or equal to 49mm 03/11/2020   Left lower lobe - suspicious for bronchogenic neoplasm  . Lymphedema   . Morbid obesity ([place])   . Prostate cancer ([place]) 03/2013   ? PANCREATIC  . S/P TAVR (transcatheter aortic valve replacement) 04/01/2020   s/p TAVR with a 29 mm Edwards Sapien 3 THV via the TF approach by Drs Buena Irish & Roxy Manns  . Severe aortic stenosis   . Sleep apnea with use of continuous positive airway pressure (CPAP)   . Tobacco abuse   . Tremor    HEAD    Past Surgical History:  Procedure Laterality Date  . ADENOIDECTOMY    . APPENDECTOMY    . CARDIAC CATHETERIZATION    . CATARACT EXTRACTION W/PHACO Left 11/28/2018   Procedure: CATARACT EXTRACTION PHACO AND INTRAOCULAR LENS PLACEMENT (IOC) LEFT, DIABETIC;  Surgeon: Birder Robson, MD;  Location: [facility] ORS;  Service: Ophthalmology;  Laterality: Left;  [name]  00:56 CDE 9.44 Fluid pack lot # [id number]  . EUS N/A 10/13/2017   Procedure: FULL UPPER ENDOSCOPIC ULTRASOUND (EUS) RADIAL;  Surgeon: Holly Bodily, MD;  Location: [facility] ENDOSCOPY;  Service: Gastroenterology;  Laterality: N/A;  . PANCREATICODUODENECTOMY  07/23/2018  . RIGHT/LEFT HEART CATH AND CORONARY ANGIOGRAPHY N/A 02/21/2020   Procedure: RIGHT/LEFT HEART CATH AND CORONARY ANGIOGRAPHY;  Surgeon: Minna Merritts, MD;  Location: [place] CV LAB;  Service: Cardiovascular;  Laterality: N/A;  . TEE WITHOUT CARDIOVERSION N/A 04/01/2020   Procedure: TRANSESOPHAGEAL ECHOCARDIOGRAM (TEE);  Surgeon: Burnell Blanks, MD;  Location: [place] CV LAB;  Service:  Open Heart Surgery;  Laterality: N/A;  . TONSILLECTOMY    . TRANSCATHETER AORTIC VALVE REPLACEMENT, TRANSFEMORAL N/A 04/01/2020   Procedure: TRANSCATHETER AORTIC VALVE REPLACEMENT, TRANSFEMORAL;  Surgeon: Burnell Blanks, MD;  Location: [place] CV LAB;  Service: Open Heart Surgery;  Laterality: N/A;  . wipple      Current Medications: Outpatient Medications Prior to Visit  Medication Sig Dispense Refill  . albuterol (PROVENTIL) (2.5 MG/3ML) 0.083% nebulizer solution Take 2.5 mg by nebulization every 6 (six) hours as needed for wheezing or shortness of breath.    [name] albuterol (VENTOLIN HFA) 108 (90 Base) MCG/ACT inhaler Inhale 2 puffs into the lungs every 6 (six) hours as needed for wheezing or shortness of breath. 6.7 g 2  . aspirin EC 81 MG tablet Take 81 mg by mouth daily.    [name] atorvastatin (LIPITOR) 80 MG tablet Take 80 mg by mouth daily.    . carvedilol (COREG) 3.125 MG tablet Take 1 tablet (3.125 mg total) by mouth 2 (two) times daily. 60 tablet 5  . cloNIDine (CATAPRES) 0.1 MG tablet Take 0.1 mg by mouth 2 (two) times daily.    . diclofenac Sodium (VOLTAREN) 1 % GEL Apply 2 g topically in the morning and at bedtime.    . gabapentin (NEURONTIN) 100 MG capsule Take 100-200 mg by mouth at bedtime.     . hydrochlorothiazide (HYDRODIURIL)  25 MG tablet Take 1 tablet (25 mg total) by mouth daily. 30 tablet 5  . insulin glargine (LANTUS) 100 unit/mL SOPN Inject 20 Units into the skin at bedtime.    . metFORMIN (GLUCOPHAGE) 500 MG tablet Take 1,000 mg by mouth 2 (two) times daily.     . pantoprazole (PROTONIX) 40 MG tablet Take 1 tablet (40 mg total) by mouth 2 (two) times daily. 60 tablet 11  . SPIRIVA HANDIHALER 18 MCG inhalation capsule INHALE CONTENTS OF 1 CAPSULE ONCE DAILY (Patient taking differently: Place 18 mcg into inhaler and inhale daily. ) 30 capsule 5  . SYMBICORT 80-4.5 MCG/ACT inhaler INHALE 2 PUFFS BY MOUTH TWICE DAILY (Patient taking differently: Inhale 2 puffs into  the lungs 2 (two) times daily. ) 10.2 g 3  . clopidogrel (PLAVIX) 75 MG tablet TAKE 1 TABLET BY MOUTH ONCE DAILY (Patient taking differently: Take 75 mg by mouth daily. ) 90 tablet 0   No facility-administered medications prior to visit.     Allergies:   Codeine   Social History   Socioeconomic History  . Marital status: Widowed    Spouse name: Not on file  . Number of children: 2  . Years of education: Not on file  . Highest education level: Not on file  Occupational History  . Occupation: retired Social research officer, government  Tobacco Use  . Smoking status: Current Some Day Smoker    Packs/day: 0.25    Years: 68.00    Pack years: 17.00    Types: Cigarettes  . Smokeless tobacco: Former Systems developer    Types: Chew, Nogal date: 2000  . Tobacco comment: 5 cigs daily 01/29/20/ER  Vaping Use  . Vaping Use: Never used  Substance and Sexual Activity  . Alcohol use: Not Currently    Alcohol/week: 0.0 - 1.0 standard drinks    Comment: quit 40years ago  . Drug use: No  . Sexual activity: Not Currently  Other Topics Concern  . Not on file  Social History Narrative   Living at [place] Strain: Medium Risk  . Difficulty of Paying Living Expenses: Somewhat hard  Food Insecurity: No Food Insecurity  . Worried About Charity fundraiser in the Last Year: Never true  . Ran Out of Food in the Last Year: Never true  Transportation Needs: No Transportation Needs  . Lack of Transportation (Medical): No  . Lack of Transportation (Non-Medical): No  Physical Activity: Insufficiently Active  . Days of Exercise per Week: 3 days  . Minutes of Exercise per Session: 10 min  Stress: No Stress Concern Present  . Feeling of Stress : Only a little  Social Connections: Moderately Isolated  . Frequency of Communication with Friends and Family: More than three times a week  . Frequency of Social Gatherings with Friends and Family: More than  three times a week  . Attends Religious Services: More than 4 times per year  . Active Member of Clubs or Organizations: No  . Attends Archivist Meetings: Never  . Marital Status: Widowed     Family History:  The patient's family history includes Breast cancer in his mother; Cancer in his father; Cerebral palsy in his brother; Tuberculosis in his paternal uncle.     ROS:   Please see the history of present illness.    ROS All other systems reviewed and are negative.   PHYSICAL EXAM:  VS:  BP (!) 138/56   Pulse 73   Ht 5\' 9"  (1.753 m)   Wt (!) 214 lb 12.8 oz (97.4 kg)   SpO2 98%   BMI 31.72 kg/m    GEN: Well nourished, well developed, in no acute distress HEENT: normal Neck: no JVD or masses Cardiac: RRR; soft flow murmur. no rubs, or gallops,no edema  Respiratory:  clear to auscultation bilaterally, normal work of breathing GI: soft, nontender, nondistended, + BS MS: no deformity or atrophy Skin: warm and dry, no rash Neuro:  Alert and Oriented x 3, Strength and sensation are intact Psych: euthymic mood, full affect   Wt Readings from Last 3 Encounters:  05/01/20 (!) 214 lb 12.8 oz (97.4 kg)  04/02/20 212 lb 1.3 oz (96.2 kg)  03/28/20 212 lb 8.4 oz (96.4 kg)      Studies/Labs Reviewed:   EKG:  EKG is not ordered today.    Recent Labs: 03/28/2020: ALT 30; B Natriuretic Peptide 79.9 04/02/2020: BUN 12; Creatinine, Ser 0.91; Hemoglobin 10.3; Magnesium 1.7; Platelets 181; Potassium 3.5; Sodium 138   Lipid Panel    Component Value Date/Time   CHOL 149 02/08/2020 1228   TRIG 108 02/08/2020 1228   HDL 38 (L) 02/08/2020 1228   CHOLHDL 3.9 02/08/2020 1228   CHOLHDL 3.6 12/06/2019 1420   [place] 91 02/08/2020 1228   [place] 82 12/06/2019 1420    Additional studies/ records that were reviewed today include:  TAVR OPERATIVE NOTE   Date of Procedure:04/01/2020  Preoperative Diagnosis:Severe Aortic Stenosis   Postoperative  Diagnosis:Same   Procedure:   Transcatheter Aortic Valve Replacement - PercutaneousRightTransfemoral Approach Edwards Sapien 3 THV (size 52mm, model # 9600TFX, serial [id number])  Co-Surgeons:Christopher Angelena Form, MD andClarence H. Roxy Manns, MD   Anesthesiologist:William Therisa Doyne, MD  Echocardiographer:Mihai Croitoru, MD  Pre-operative Echo Findings: ? Severe aortic stenosis ? Normalleft ventricular systolic function  Post-operative Echo Findings: ? Noparavalvular leak ? Normalleft ventricular systolic function  _____________  Echo6/30/21:  IMPRESSIONS 1. Left ventricular ejection fraction, by estimation, is 70 to 75%. The  left ventricle has hyperdynamic function. The left ventricle has no  regional wall motion abnormalities. There is mild concentric left  ventricular hypertrophy. Left ventricular  diastolic parameters are consistent with Grade I diastolic dysfunction  (impaired relaxation).  2. Right ventricular systolic function is normal. The right ventricular  size is normal. Tricuspid regurgitation signal is inadequate for assessing  PA pressure.  3. Left atrial size was mildly dilated.  4. The mitral valve is normal in structure. No evidence of mitral valve  regurgitation.  5. The aortic valve has been repaired/replaced. Aortic valve  regurgitation is trivial. Procedure Date: 04/01/2020. Echo findings are  consistent with normal structure and function of the aortic valve  prosthesis. Aortic valve mean gradient measures 14.3  mmHg. Aortic valve Vmax measures 2.63 m/s.   Comparison(s): A prior study was performed on 04/01/2020. Prior images  reviewed side by side. There is a trivial perivalvular leak seen and the  TAVR gradients are slightly higher, due to increased cardiac output.    ASSESSMENT & PLAN:   Severe AS s/p TAVR: Echo today shows EF 70%,  normally functioning TAVR with a mean gradient of 14.3 mmHg and trivial PVL. He has NYHA Little I symptoms. SBE prophylaxis discussed; he has no teeth and does not go to the dentist. Continue ASA and Plavix. Plavix can be discontinued after 6 months of therapy. I will see him back in 1 year for follow up  and echo.   HTN: BP well controlled. No changes made  Pulmonary nodule concerning for primary bronchogenic carcinoma: will make referral tothe multidisciplinary thoracic cancer team   Tobacco abuse: he has cut back a lot. Counseled on completed cessation.     Medication Adjustments/Labs and Tests Ordered: Current medicines are reviewed at length with the patient today.  Concerns regarding medicines are outlined above.  Medication changes, Labs and Tests ordered today are listed in the Patient Instructions below. Patient Instructions  Medication Instructions:  1) you may STOP PLAVIX around 10/01/2020 when your pills run out *If you need a refill on your cardiac medications before your next appointment, please call your pharmacy*   Follow-Up: You will be called to arrange your appointment with the specialist about your lung.    Mable Fill, PA-C  05/02/2020 11:51 AM    [facility] [address], [place], [place]  [address] Phone: [phone]; Fax: [phone]

## 2020-05-01 NOTE — Patient Instructions (Addendum)
Medication Instructions:  1) you may STOP PLAVIX around 10/01/2020 when your pills run out *If you need a refill on your cardiac medications before your next appointment, please call your pharmacy*   Follow-Up: You will be called to arrange your appointment with the specialist about your lung.

## 2020-05-05 ENCOUNTER — Ambulatory Visit (INDEPENDENT_AMBULATORY_CARE_PROVIDER_SITE_OTHER): Payer: Medicare Other | Admitting: General Practice

## 2020-05-05 ENCOUNTER — Telehealth: Payer: Self-pay | Admitting: General Practice

## 2020-05-05 ENCOUNTER — Telehealth: Payer: Self-pay | Admitting: *Deleted

## 2020-05-05 DIAGNOSIS — J449 Chronic obstructive pulmonary disease, unspecified: Secondary | ICD-10-CM | POA: Diagnosis not present

## 2020-05-05 DIAGNOSIS — F4310 Post-traumatic stress disorder, unspecified: Secondary | ICD-10-CM

## 2020-05-05 DIAGNOSIS — I10 Essential (primary) hypertension: Secondary | ICD-10-CM | POA: Diagnosis not present

## 2020-05-05 DIAGNOSIS — E782 Mixed hyperlipidemia: Secondary | ICD-10-CM

## 2020-05-05 DIAGNOSIS — F324 Major depressive disorder, single episode, in partial remission: Secondary | ICD-10-CM | POA: Diagnosis not present

## 2020-05-05 DIAGNOSIS — E1165 Type 2 diabetes mellitus with hyperglycemia: Secondary | ICD-10-CM

## 2020-05-05 DIAGNOSIS — K625 Hemorrhage of anus and rectum: Secondary | ICD-10-CM

## 2020-05-05 DIAGNOSIS — I5032 Chronic diastolic (congestive) heart failure: Secondary | ICD-10-CM

## 2020-05-05 DIAGNOSIS — Z720 Tobacco use: Secondary | ICD-10-CM

## 2020-05-05 DIAGNOSIS — R195 Other fecal abnormalities: Secondary | ICD-10-CM

## 2020-05-05 NOTE — Telephone Encounter (Signed)
I received referral, I called patient to schedule. I was unable to reach but did leave vm message with my name and phone number to call.

## 2020-05-05 NOTE — Chronic Care Management (AMB) (Signed)
Chronic Care Management   Follow Up Note   05/05/2020 Name: Riyansh Gerstner MRN: [id number] DOB: 08-23-1946  Referred by: Verl Bangs, FNP Reason for referral : Chronic Care Management (RNCM: Chronic Case Managment and Care Coordination Needs)   Bronislaus Verdell is a 74 y.o. year old male who is a primary care patient of Lorine Bears, [name], FNP. The CCM team was consulted for assistance with chronic disease management and care coordination needs.    Review of patient status, including review of consultants reports, relevant laboratory and other test results, and collaboration with appropriate care team members and the patient's provider was performed as part of comprehensive patient evaluation and provision of chronic care management services.    SDOH (Social Determinants of Health) assessments performed: Yes See Care Plan activities for detailed interventions related to [facility])     Outpatient Encounter Medications as of 05/05/2020  Medication Sig Note   albuterol (PROVENTIL) (2.5 MG/3ML) 0.083% nebulizer solution Take 2.5 mg by nebulization every 6 (six) hours as needed for wheezing or shortness of breath.    albuterol (VENTOLIN HFA) 108 (90 Base) MCG/ACT inhaler Inhale 2 puffs into the lungs every 6 (six) hours as needed for wheezing or shortness of breath.    aspirin EC 81 MG tablet Take 81 mg by mouth daily.    atorvastatin (LIPITOR) 80 MG tablet Take 80 mg by mouth daily.    carvedilol (COREG) 3.125 MG tablet Take 1 tablet (3.125 mg total) by mouth 2 (two) times daily.    cloNIDine (CATAPRES) 0.1 MG tablet Take 0.1 mg by mouth 2 (two) times daily.    clopidogrel (PLAVIX) 75 MG tablet Take 1 tablet (75 mg total) by mouth daily.    diclofenac Sodium (VOLTAREN) 1 % GEL Apply 2 g topically in the morning and at bedtime.    gabapentin (NEURONTIN) 100 MG capsule Take 100-200 mg by mouth at bedtime.     hydrochlorothiazide (HYDRODIURIL) 25 MG tablet Take 1 tablet (25 mg total) by mouth  daily.    insulin glargine (LANTUS) 100 unit/mL SOPN Inject 20 Units into the skin at bedtime. 03/28/2020: Pt will be using 10 units prior to procedure.   metFORMIN (GLUCOPHAGE) 500 MG tablet Take 1,000 mg by mouth 2 (two) times daily.     pantoprazole (PROTONIX) 40 MG tablet Take 1 tablet (40 mg total) by mouth 2 (two) times daily.    SPIRIVA HANDIHALER 18 MCG inhalation capsule INHALE CONTENTS OF 1 CAPSULE ONCE DAILY (Patient taking differently: Place 18 mcg into inhaler and inhale daily. )    SYMBICORT 80-4.5 MCG/ACT inhaler INHALE 2 PUFFS BY MOUTH TWICE DAILY (Patient taking differently: Inhale 2 puffs into the lungs 2 (two) times daily. )    No facility-administered encounter medications on file as of 05/05/2020.     Objective:  BP Readings from Last 3 Encounters:  05/01/20 (!) 138/56  04/02/20 (!) 148/71  03/28/20 132/66     Goals Addressed              This Visit's Progress     RNCM: Pt- "My blood sugar this am was 187" (pt-stated)        CARE PLAN ENTRY (see longtitudinal plan of care for additional care plan information)  Objective:  Lab Results  Component Value Date   HGBA1C 8.5 (H) 03/28/2020    Lab Results  Component Value Date   CREATININE 0.82 03/28/2020   CREATININE 0.92 02/21/2020   CREATININE 0.84 02/20/2020  No results found for: EGFR  Current Barriers:   Knowledge Deficits related to basic Diabetes pathophysiology and self care/management  Knowledge Deficits related to medications used for management of diabetes  Financial Constraints  Limited Social Support  Case Manager Clinical Goal(s):  Over the next 120 days, patient will demonstrate improved adherence to prescribed treatment plan for diabetes self care/management as evidenced by:   daily monitoring and recording of CBG   adherence to ADA/ carb modified diet  exercise 3/4 days/week for at least 15 minutes of moderate activity  adherence to prescribed medication  regimen  Interventions:   Provided education to patient about basic DM disease process  Reviewed medications with patient and discussed importance of medication adherence  Discussed plans with patient for ongoing care management follow up and provided patient with direct contact information for care management team  Provided patient with written educational materials related to hypo and hyperglycemia and importance of correct treatment  Advised patient, providing education and rationale, to check cbg daily and record, calling pcp for findings outside established parameters.  05-05-2020: The patient states that his blood sugars this am was 128.  The patient states that it goes up and down. The patient says it is dependent on what he eats.   Evaluation of the patients dietary habits. The patient states that he is sometimes only eating once a day, sometimes he skips a whole day. Education on eating smaller portion sizes and eating 3 meals a day with snacks. The patient states sometimes he has things that are not good for him. Discussed sugar free candy options and eating healthy. The patient states he is not always compliant with restrictions.   Review of patient status, including review of consultants reports, relevant laboratory and other test results, and medications completed.  Patient Self Care Activities:   UNABLE to independently manage DM as evidence of most recent hemoglobin A1C of 8.5 on 03-28-2020  Adheres to prescribed ADA/carb modified  Please see past updates related to this goal by clicking on the "Past Updates" button in the selected goal        RNCM: Pt-"I am down to smoking 2 cigarettes a day" (pt-stated)        [place] (see longtitudinal plan of care for additional care plan information)  Current Barriers:   Tobacco abuse of 66 years; currently smoking 1 ppd  Previous quit attempts, unsuccessful multiple successful using the patient verbalized he has been  unsuccessful at quitting smoking.   Reports smoking within 30 minutes of waking up  Reports triggers to smoke include: stress and habit  Reports motivation to quit smoking includes: Knows it would be better for his health and well being. Has a lady friend that he likes to spend time with  On a scale of 1-10, reports MOTIVATION to quit is 8  On a scale of 1-10, reports CONFIDENCE in quitting is 7  Clinical Goal(s):   Over the next 240 days, patient will work with Chief Operating Officer and provider towards tobacco cessation   Interventions:  Evaluation of current treatment plan reviewed  Provided contact information for [employer] (1-800-QUIT-NOW). Patient will outreach this group for support.  Discussed plans with patient for ongoing care management follow up and provided patient with direct contact information for care management team  Reviewed scheduled/upcoming provider appointments including: the patient has a planned hospitalization on 04-01-2020. Wants to talk to the pcp after his procedure on the possibility  of a referral to acupuncture to help with quitting. He has tried other means and has not been successful. 05-05-2020: The patient has had his procedure and is committed to smoking cessation at this time. He is smoking 2 cigarettes a day. He states that he has tried to quit "cold [place]" but it did not work. The patient states that he wants to talk to the pcp about acupuncture.  His next appointment is 06-12-2020.  He states he and his lady friend both want to quit.  Discussed doing it on the buddy system. He says she doesn't like needles, he doesn't mind. Encouraged the patient to talk to pcp about options that may work well for him.   Referred patient to pharmacy team  Provided contact information for [place] [facility]uit [facility] (1-800-QUIT-NOW). Patient will outreach this group for support.  Evaluation of current treatment plan reviewed   Patient Self  Care Activities:   Patient will try distraction and walking outside. as a habit during cravings   Patient will commit to reducing tobacco consumption  Please see past updates related to this goal by clicking on the "Past Updates" button in the selected goal        RNCM: Pt-"I have a lot of health problems" (pt-stated)        [place] (see longtitudinal plan of care for additional care plan information)  Current Barriers:   Chronic Disease Management support, education, and care coordination needs related to CHF, HTN, HLD, COPD, Depression, and aortic stenosis  Clinical Goal(s) related to CHF, HTN, HLD, COPD, Depression, and aortic stenosis  Over the next 120 days, patient will:   Work with the care management team to address educational, disease management, and care coordination needs   Begin or continue self health monitoring activities as directed today Measure and record blood pressure 3/4 times per week, Measure and record weight daily, and adhere to a heart healthy/ADA diet  Call provider office for new or worsened signs and symptoms Blood pressure findings outside established parameters, Weight outside established parameters, Oxygen saturation lower than established parameter, Chest pain, Shortness of breath, and New or worsened symptom related to COPD/Aortic stenosis/HLD/Depression and other chronic conditions  Call care management team with questions or concerns  Verbalize basic understanding of patient centered plan of care established today  Interventions related to CHF, HTN, HLD, COPD, Depression, and aortic stenosis :   Evaluation of current treatment plans and patient's adherence to plan as established by provider.  The patient will be admitted on 04-01-2020 to have stents placed. Per the patient he has blockages in the main artery of his heart. The patient verbalized he would likely be in the hospital for about 4 days. Echo in May showed HFpEF of 60-65%, the  patient does have aortic stenosis. The patient knows he is at high risk of heart attack and stroke. 05-05-2020: The patient is doing well post procedure.  He does have some weakness but other than that he feels great.  Assessed patient understanding of disease states.  The patient verbalized understanding of his chronic conditions. The patient wants to be active and take care of himself. The patient denies any acute distress.   Assessed patient's education and care coordination needs.  The patient verbalized after he has his procedure he wants to do his part to maintain his health and well being. The patient knows he needs to watch his dietary restrictions and wants to work on smoking cessation.   Provided disease specific education  to patient.  Education on pacing activity and monitoring blood pressure. Blood pressure was elevated several days ago at 132/101, but the patient was outside and feels he was over exerting himself. Other recent readings were 135/70.  The patient states he will follow the recommendations of the healthcare team. The patient states he is ready for his procedure. 05-05-2020: The patient states he is doing well.  His blood pressure is doing well also.  He states his pressure at the doctor office was 123/60.  Evaluation of the patients recent tele-health visit for blood in his stool.  The patient states he has found out what was causing it. The patient states that it is sometimes the way he is eating. The patient states that if he has to strain to have a bowel movement that is when he has the blood. Discussed eating fiber and fruits in his diet. Also discussed talking to pcp about this at next appointment if this pattern continues.   Collaborated with appropriate clinical care team members regarding patient needs.  The patient is currently working with the LCSW and pharmacist.   Patient Self Care Activities related to CHF, HTN, HLD, COPD, Depression, and aortic stenosis :   Patient  is unable to independently self-manage chronic health conditions  Initial goal documentation and Please see past updates related to this goal by clicking on the "Past Updates" button in the selected goal          Plan:   Telephone follow up appointment with care management team member scheduled for: 07-07-2020 at 1 pm   [place], MSN, [place] Mobile: [phone]

## 2020-05-05 NOTE — Patient Instructions (Signed)
Visit Information  Goals Addressed              This Visit's Progress   .  RNCM: Pt- "My blood sugar this am was 187" (pt-stated)        CARE PLAN ENTRY (see longtitudinal plan of care for additional care plan information)  Objective:  Lab Results  Component Value Date   HGBA1C 8.5 (H) 03/28/2020 .   Lab Results  Component Value Date   CREATININE 0.82 03/28/2020   CREATININE 0.92 02/21/2020   CREATININE 0.84 02/20/2020 .   [name] No results found for: EGFR  Current Barriers:  [name] Knowledge Deficits related to basic Diabetes pathophysiology and self care/management . Knowledge Deficits related to medications used for management of diabetes . Film/video editor . Limited Social Support  Case Manager Clinical Goal(s):  Over the next 120 days, patient will demonstrate improved adherence to prescribed treatment plan for diabetes self care/management as evidenced by:  . daily monitoring and recording of CBG  . adherence to ADA/ carb modified diet . exercise 3/4 days/week for at least 15 minutes of moderate activity . adherence to prescribed medication regimen  Interventions:  . Provided education to patient about basic DM disease process . Reviewed medications with patient and discussed importance of medication adherence . Discussed plans with patient for ongoing care management follow up and provided patient with direct contact information for care management team . Provided patient with written educational materials related to hypo and hyperglycemia and importance of correct treatment . Advised patient, providing education and rationale, to check cbg daily and record, calling pcp for findings outside established parameters.  05-05-2020: The patient states that his blood sugars this am was 128.  The patient states that it goes up and down. The patient says it is dependent on what he eats.  . Evaluation of the patients dietary habits. The patient states that he is sometimes only  eating once a day, sometimes he skips a whole day. Education on eating smaller portion sizes and eating 3 meals a day with snacks. The patient states sometimes he has things that are not good for him. Discussed sugar free candy options and eating healthy. The patient states he is not always compliant with restrictions.  . Review of patient status, including review of consultants reports, relevant laboratory and other test results, and medications completed.  Patient Self Care Activities:  . UNABLE to independently manage DM as evidence of most recent hemoglobin A1C of 8.5 on 03-28-2020 . Adheres to prescribed ADA/carb modified  Please see past updates related to this goal by clicking on the "Past Updates" button in the selected goal      .  RNCM: Pt-"I am down to smoking 2 cigarettes a day" (pt-stated)        [place] (see longtitudinal plan of care for additional care plan information)  Current Barriers:  . Tobacco abuse of 66 years; currently smoking 1 ppd . Previous quit attempts, unsuccessful multiple successful using the patient verbalized he has been unsuccessful at quitting smoking.  . Reports smoking within 30 minutes of waking up . Reports triggers to smoke include: stress and habit . Reports motivation to quit smoking includes: Knows it would be better for his health and well being. Has a lady friend that he likes to spend time with . On a scale of 1-10, reports MOTIVATION to quit is 8 . On a scale of 1-10, reports CONFIDENCE in quitting is 7  Clinical Goal(s):  .  Over the next 240 days, patient will work with Comptroller Licensed Clinical Social Worker and provider towards tobacco cessation   Interventions: . Evaluation of current treatment plan reviewed . Provided contact information for [place] [facility]uit [facility] (1-800-QUIT-NOW). Patient will outreach this group for support. . Discussed plans with patient for ongoing care management follow up and provided patient with  direct contact information for care management team . Reviewed scheduled/upcoming provider appointments including: the patient has a planned hospitalization on 04-01-2020. Wants to talk to the pcp after his procedure on the possibility of a referral to acupuncture to help with quitting. He has tried other means and has not been successful. 05-05-2020: The patient has had his procedure and is committed to smoking cessation at this time. He is smoking 2 cigarettes a day. He states that he has tried to quit "cold [place]" but it did not work. The patient states that he wants to talk to the pcp about acupuncture.  His next appointment is 06-12-2020.  He states he and his lady friend both want to quit.  Discussed doing it on the buddy system. He says she doesn't like needles, he doesn't mind. Encouraged the patient to talk to pcp about options that may work well for him.  [name] Referred patient to pharmacy team . Provided contact information for [place] [facility] (1-800-QUIT-NOW). Patient will outreach this group for support. . Evaluation of current treatment plan reviewed   Patient Self Care Activities:  . Patient will try distraction and walking outside. as a habit during cravings  . Patient will commit to reducing tobacco consumption  Please see past updates related to this goal by clicking on the "Past Updates" button in the selected goal      .  RNCM: Pt-"I have a lot of health problems" (pt-stated)        CARE PLAN ENTRY (see longtitudinal plan of care for additional care plan information)  Current Barriers:  . Chronic Disease Management support, education, and care coordination needs related to CHF, HTN, HLD, COPD, Depression, and aortic stenosis  Clinical Goal(s) related to CHF, HTN, HLD, COPD, Depression, and aortic stenosis  Over the next 120 days, patient will:  . Work with the care management team to address educational, disease management, and care coordination needs  . Begin or continue self health  monitoring activities as directed today Measure and record blood pressure 3/4 times per week, Measure and record weight daily, and adhere to a heart healthy/ADA diet . Call provider office for new or worsened signs and symptoms Blood pressure findings outside established parameters, Weight outside established parameters, Oxygen saturation lower than established parameter, Chest pain, Shortness of breath, and New or worsened symptom related to COPD/Aortic stenosis/HLD/Depression and other chronic conditions . Call care management team with questions or concerns . Verbalize basic understanding of patient centered plan of care established today  Interventions related to CHF, HTN, HLD, COPD, Depression, and aortic stenosis :  . Evaluation of current treatment plans and patient's adherence to plan as established by provider.  The patient will be admitted on 04-01-2020 to have stents placed. Per the patient he has blockages in the main artery of his heart. The patient verbalized he would likely be in the hospital for about 4 days. Echo in May showed HFpEF of 60-65%, the patient does have aortic stenosis. The patient knows he is at high risk of heart attack and stroke. 05-05-2020: The patient is doing well post procedure.  He does have  some weakness but other than that he feels great. . Assessed patient understanding of disease states.  The patient verbalized understanding of his chronic conditions. The patient wants to be active and take care of himself. The patient denies any acute distress.  . Assessed patient's education and care coordination needs.  The patient verbalized after he has his procedure he wants to do his part to maintain his health and well being. The patient knows he needs to watch his dietary restrictions and wants to work on smoking cessation.  . Provided disease specific education to patient.  Education on pacing activity and monitoring blood pressure. Blood pressure was elevated several days  ago at 132/101, but the patient was outside and feels he was over exerting himself. Other recent readings were 135/70.  The patient states he will follow the recommendations of the healthcare team. The patient states he is ready for his procedure. 05-05-2020: The patient states he is doing well.  His blood pressure is doing well also.  He states his pressure at the doctor office was 123/60. [name] Evaluation of the patients recent tele-health visit for blood in his stool.  The patient states he has found out what was causing it. The patient states that it is sometimes the way he is eating. The patient states that if he has to strain to have a bowel movement that is when he has the blood. Discussed eating fiber and fruits in his diet. Also discussed talking to pcp about this at next appointment if this pattern continues.  Nash Dimmer with appropriate clinical care team members regarding patient needs.  The patient is currently working with the LCSW and pharmacist.   Patient Self Care Activities related to CHF, HTN, HLD, COPD, Depression, and aortic stenosis :  . Patient is unable to independently self-manage chronic health conditions  Initial goal documentation and Please see past updates related to this goal by clicking on the "Past Updates" button in the selected goal         Patient verbalizes understanding of instructions provided today.   Telephone follow up appointment with care management team member scheduled for: 07-07-2020 at 1 pm  Noreene Larsson RN, MSN, [place] Mobile: [phone]

## 2020-05-06 ENCOUNTER — Telehealth: Payer: Self-pay | Admitting: *Deleted

## 2020-05-06 NOTE — Telephone Encounter (Signed)
I received referral on Nathaniel Little.  I called to schedule him to be seen but was unable to reach him.  I did leave vm message for him to call with my name and phone number.

## 2020-05-07 ENCOUNTER — Telehealth: Payer: Self-pay

## 2020-05-07 ENCOUNTER — Telehealth: Payer: Self-pay | Admitting: Pharmacist

## 2020-05-07 NOTE — Telephone Encounter (Signed)
°  Chronic Care Management   Outreach Note  05/07/2020 Name: Madeline Bebout MRN: [id number] DOB: 01/08/1946  Referred by: Verl Bangs, FNP Reason for referral : No chief complaint on file.   An unsuccessful telephone outreach was attempted today. The patient was referred to the case management team for assistance with care management and care coordination.   Follow Up Plan: Will collaborate with Care Guide to outreach to schedule follow up with me  Harlow Asa, PharmD, [place] [facility] [phone]

## 2020-05-07 NOTE — Telephone Encounter (Signed)
Left voicemail message for patient to call back regarding medications and need for review.

## 2020-05-08 ENCOUNTER — Telehealth: Payer: Self-pay

## 2020-05-08 NOTE — Chronic Care Management (AMB) (Signed)
  Care Management   Note  05/08/2020 Name: Euel Castile MRN: [id number] DOB: 1946/01/29  Calistro Rauf is a 74 y.o. year old male who is a primary care patient of Lorine Bears, [name], [place] and is actively engaged with the care management team. I reached out to [name] by phone today to assist with re-scheduling a follow up visit with the Pharmacist  Follow up plan: Unsuccessful telephone outreach attempt made. A HIPPA compliant phone message was left for the patient providing contact information and requesting a return call.  The care management team will reach out to the patient again over the next 7 days.  If patient returns call to provider office, please advise to call [facility]  at [place], [place], [place], [place] [address] Direct Dial: [phone] [email or web address]: [email or web address]

## 2020-05-09 NOTE — Telephone Encounter (Signed)
Left voicemail message to call back  

## 2020-05-11 NOTE — Telephone Encounter (Signed)
Perhaps we can contact family? Or send a letter?

## 2020-05-12 ENCOUNTER — Encounter: Payer: Self-pay | Admitting: *Deleted

## 2020-05-12 NOTE — Telephone Encounter (Signed)
Number for daughter unavailable. Will send patient letter to please call our office to discuss medications.

## 2020-05-14 ENCOUNTER — Telehealth: Payer: Self-pay | Admitting: *Deleted

## 2020-05-14 NOTE — Telephone Encounter (Signed)
I called to try an reach [name] today but again unable to reach him. I did leave vm message with my name and phone number to call.

## 2020-05-16 ENCOUNTER — Telehealth: Payer: Self-pay | Admitting: *Deleted

## 2020-05-16 NOTE — Telephone Encounter (Signed)
I received a referral on Nathaniel Little.  I called but was unable to reach him but did leave a vm message with my name and phone number to call.

## 2020-05-16 NOTE — Chronic Care Management (AMB) (Signed)
  Care Management   Note  05/16/2020 Name: Nathaniel Little MRN: [id number] DOB: 25-Oct-1945  Nathaniel Little is a 74 y.o. year old male who is a primary care patient of Lorine Bears, [name], [place] and is actively engaged with the care management team. I reached out to [name] by phone today to assist with re-scheduling a follow up visit with the Pharmacist  Follow up plan: Unsuccessful telephone outreach attempt made. A HIPPA compliant phone message was left for the patient providing contact information and requesting a return call.  The care management team will reach out to the patient again over the next 7 days.  If patient returns call to provider office, please advise to call [facility]  at [place], [place], [place], [place] [address] Direct Dial: [phone] [email or web address]

## 2020-05-17 ENCOUNTER — Other Ambulatory Visit: Payer: Self-pay

## 2020-05-17 ENCOUNTER — Encounter: Payer: Self-pay | Admitting: Emergency Medicine

## 2020-05-17 ENCOUNTER — Emergency Department: Payer: Medicare Other

## 2020-05-17 DIAGNOSIS — R1013 Epigastric pain: Secondary | ICD-10-CM | POA: Insufficient documentation

## 2020-05-17 DIAGNOSIS — R112 Nausea with vomiting, unspecified: Secondary | ICD-10-CM | POA: Insufficient documentation

## 2020-05-17 DIAGNOSIS — Z79899 Other long term (current) drug therapy: Secondary | ICD-10-CM | POA: Insufficient documentation

## 2020-05-17 DIAGNOSIS — I10 Essential (primary) hypertension: Secondary | ICD-10-CM | POA: Diagnosis not present

## 2020-05-17 DIAGNOSIS — Z7982 Long term (current) use of aspirin: Secondary | ICD-10-CM | POA: Diagnosis not present

## 2020-05-17 DIAGNOSIS — E119 Type 2 diabetes mellitus without complications: Secondary | ICD-10-CM | POA: Insufficient documentation

## 2020-05-17 DIAGNOSIS — Z794 Long term (current) use of insulin: Secondary | ICD-10-CM | POA: Diagnosis not present

## 2020-05-17 DIAGNOSIS — F17210 Nicotine dependence, cigarettes, uncomplicated: Secondary | ICD-10-CM | POA: Insufficient documentation

## 2020-05-17 DIAGNOSIS — Z20822 Contact with and (suspected) exposure to covid-19: Secondary | ICD-10-CM | POA: Insufficient documentation

## 2020-05-17 DIAGNOSIS — J449 Chronic obstructive pulmonary disease, unspecified: Secondary | ICD-10-CM | POA: Insufficient documentation

## 2020-05-17 LAB — CBC
HCT: 35.4 % — ABNORMAL LOW (ref 39.0–52.0)
Hemoglobin: 11.3 g/dL — ABNORMAL LOW (ref 13.0–17.0)
MCH: 25.5 pg — ABNORMAL LOW (ref 26.0–34.0)
MCHC: 31.9 g/dL (ref 30.0–36.0)
MCV: 79.7 fL — ABNORMAL LOW (ref 80.0–100.0)
Platelets: 245 10*3/uL (ref 150–400)
RBC: 4.44 MIL/uL (ref 4.22–5.81)
RDW: 18.2 % — ABNORMAL HIGH (ref 11.5–15.5)
WBC: 9.7 10*3/uL (ref 4.0–10.5)
nRBC: 0 % (ref 0.0–0.2)

## 2020-05-17 LAB — COMPREHENSIVE METABOLIC PANEL
ALT: 244 U/L — ABNORMAL HIGH (ref 0–44)
AST: 273 U/L — ABNORMAL HIGH (ref 15–41)
Albumin: 4 g/dL (ref 3.5–5.0)
Alkaline Phosphatase: 446 U/L — ABNORMAL HIGH (ref 38–126)
Anion gap: 11 (ref 5–15)
BUN: 29 mg/dL — ABNORMAL HIGH (ref 8–23)
CO2: 24 mmol/L (ref 22–32)
Calcium: 9.4 mg/dL (ref 8.9–10.3)
Chloride: 104 mmol/L (ref 98–111)
Creatinine, Ser: 0.79 mg/dL (ref 0.61–1.24)
GFR calc Af Amer: >60 mL/min (ref >60)
GFR calc non Af Amer: >60 mL/min (ref >60)
Glucose, Bld: 257 mg/dL — ABNORMAL HIGH (ref 70–99)
Potassium: 3.7 mmol/L (ref 3.5–5.1)
Sodium: 139 mmol/L (ref 135–145)
Total Bilirubin: 2 mg/dL — ABNORMAL HIGH (ref 0.3–1.2)
Total Protein: 7.6 g/dL (ref 6.5–8.1)

## 2020-05-17 LAB — URINALYSIS, COMPLETE (UACMP) WITH MICROSCOPIC
Bacteria, UA: NONE SEEN
Bilirubin Urine: NEGATIVE
Glucose, UA: >=500 mg/dL — AB
Hgb urine dipstick: NEGATIVE
Ketones, ur: NEGATIVE mg/dL
Leukocytes,Ua: NEGATIVE
Nitrite: NEGATIVE
Protein, ur: 100 mg/dL — AB
Specific Gravity, Urine: 1.023 (ref 1.005–1.030)
pH: 5 (ref 5.0–8.0)

## 2020-05-17 LAB — LIPASE, BLOOD: Lipase: 15 U/L (ref 11–51)

## 2020-05-17 NOTE — ED Notes (Signed)
Pt transported to US

## 2020-05-17 NOTE — ED Triage Notes (Signed)
First RN Note: Pt presnets to ED via [facility] from home with c/o N/V x 3 days. Per EMS pt has hx of DM, per EMS   CBG 258

## 2020-05-17 NOTE — ED Triage Notes (Signed)
Pt to ED via [facility] from home for vomiting and gas. Pt states that he is also having burning in the bottom of his stomach. Pt reports that the vomiting started this morning. Pt is not able to keep any fluids down. Pt reports that every thing that he drinks taste sour. Pt is in NAD at this time.

## 2020-05-18 ENCOUNTER — Emergency Department
Admission: EM | Admit: 2020-05-18 | Discharge: 2020-05-18 | Payer: Medicare Other | Attending: Emergency Medicine | Admitting: Emergency Medicine

## 2020-05-18 ENCOUNTER — Emergency Department: Payer: Medicare Other

## 2020-05-18 DIAGNOSIS — R112 Nausea with vomiting, unspecified: Secondary | ICD-10-CM

## 2020-05-18 LAB — LACTIC ACID, PLASMA: Lactic Acid, Venous: 1.8 mmol/L (ref 0.5–1.9)

## 2020-05-18 LAB — SARS CORONAVIRUS 2 BY RT PCR (HOSPITAL ORDER, PERFORMED IN ~~LOC~~ HOSPITAL LAB): SARS Coronavirus 2: NEGATIVE

## 2020-05-18 MED ORDER — IOHEXOL 300 MG/ML  SOLN
100.0000 mL | Freq: Once | INTRAMUSCULAR | Status: AC | PRN
  Administered 2020-05-18: 100 mL via INTRAVENOUS

## 2020-05-18 NOTE — Discharge Instructions (Signed)
Please follow-up with the cancer center and Dr.Rao will be watching for you.  Give them a call as soon as you get done with [facility].  If you have to stay due for any length of time let them know about the tumor in the lung as well.  I had already mentioned it.

## 2020-05-18 NOTE — ED Notes (Signed)
Pt called for vital signs. No answer, pt not in lobby or outside

## 2020-05-18 NOTE — ED Notes (Signed)
Pt called for vital sign recheck. Pt not in lobby or outside

## 2020-05-18 NOTE — ED Provider Notes (Signed)
[facility] Emergency Department Provider Note   ____________________________________________   First MD Initiated Contact with Patient 05/18/20 1021     (approximate)  I have reviewed the triage vital signs and the nursing notes.   HISTORY  Chief Complaint Emesis    HPI Nathaniel Little is a 74 y.o. male with a history of diabetes who reports nausea and vomiting for 3 days.  He also has some burning in his stomach.  He says the vomiting started again this morning.  And everything he eats or drinks taste sour.  Patient incidentally asked me about the lung nodule he was found to have in June.  Review of his old records show that he had a PET scan it looks suspicious for bronchogenic carcinoma.         Past Medical History:  Diagnosis Date   (HFpEF) heart failure with preserved ejection fraction ([place])    a. 08/2017 Echo: EF 60-65%, Gr1 DD.   Arthritis    COPD (chronic obstructive pulmonary disease) (HCC)    Depression    Diabetes mellitus without complication (HCC)    Hypertension    Incidental pulmonary nodule, greater than or equal to 61mm 03/11/2020   Left lower lobe - suspicious for bronchogenic neoplasm   Lymphedema    Morbid obesity ([place])    Prostate cancer ([place]) 03/2013   ? PANCREATIC   S/P TAVR (transcatheter aortic valve replacement) 04/01/2020   s/p TAVR with a 29 mm Edwards Sapien 3 THV via the TF approach by Drs Buena Irish & Roxy Manns   Severe aortic stenosis    Sleep apnea with use of continuous positive airway pressure (CPAP)    Tobacco abuse    Tremor    HEAD    Patient Active Problem List   Diagnosis Date Noted   Dark stools 04/17/2020   BRBPR (bright red blood per rectum) 04/17/2020   S/P TAVR (transcatheter aortic valve replacement) 04/01/2020   (HFpEF) heart failure with preserved ejection fraction ([place])    Diabetes mellitus without complication ([place])    Incidental pulmonary nodule, greater than or equal to  76mm 03/11/2020   Duodenitis 02/18/2020   Diabetes ([place]) 12/06/2019   Erectile dysfunction due to arterial insufficiency 09/24/2019   Lumbar degenerative disc disease 06/25/2019   Sacroiliac joint dysfunction of both sides 06/25/2019   Chronic heart failure with preserved ejection fraction ([place]) 01/19/2019   Tobacco abuse 01/19/2019   Severe aortic stenosis    Hyperlipidemia 07/20/2018   IPMN (intraductal papillary mucinous neoplasm) 12/24/2017   Claudication in peripheral vascular disease ([place]) 08/22/2017   Pancreatic mass 08/20/2017   Aortic atherosclerosis ([place]) 07/25/2017   BMI 38.0-38.9,adult 02/09/2017   Arthritis pain of hip 01/07/2017   Chronic obstructive pulmonary disease ([place]) 01/07/2017   GERD (gastroesophageal reflux disease) 01/07/2017   Essential hypertension 01/07/2017   Depression 01/06/2017   Sleep apnea with use of continuous positive airway pressure (CPAP) 01/06/2017   Personal history of prostate cancer 03/04/2013    Past Surgical History:  Procedure Laterality Date   ADENOIDECTOMY     APPENDECTOMY     CARDIAC CATHETERIZATION     CATARACT EXTRACTION W/PHACO Left 11/28/2018   Procedure: CATARACT EXTRACTION PHACO AND INTRAOCULAR LENS PLACEMENT ([place]) LEFT, DIABETIC;  Surgeon: Birder Robson, MD;  Location: [facility] ORS;  Service: Ophthalmology;  Laterality: Left;  [name]  00:56 CDE 9.44 Fluid pack lot # [id number]   EUS N/A 10/13/2017   Procedure: FULL UPPER ENDOSCOPIC ULTRASOUND (EUS) RADIAL;  Surgeon: Tillie Rung  A, MD;  Location: [facility] ENDOSCOPY;  Service: Gastroenterology;  Laterality: N/A;   PANCREATICODUODENECTOMY  07/23/2018   RIGHT/LEFT HEART CATH AND CORONARY ANGIOGRAPHY N/A 02/21/2020   Procedure: RIGHT/LEFT HEART CATH AND CORONARY ANGIOGRAPHY;  Surgeon: Minna Merritts, MD;  Location: [facility];  Service: Cardiovascular;  Laterality: N/A;   TEE WITHOUT CARDIOVERSION N/A 04/01/2020   Procedure: TRANSESOPHAGEAL  ECHOCARDIOGRAM (TEE);  Surgeon: Burnell Blanks, MD;  Location: [facility];  Service: Open Heart Surgery;  Laterality: N/A;   TONSILLECTOMY     TRANSCATHETER AORTIC VALVE REPLACEMENT, TRANSFEMORAL N/A 04/01/2020   Procedure: TRANSCATHETER AORTIC VALVE REPLACEMENT, TRANSFEMORAL;  Surgeon: Burnell Blanks, MD;  Location: [facility];  Service: Open Heart Surgery;  Laterality: N/A;   wipple      Prior to Admission medications   Medication Sig Start Date End Date Taking? Authorizing Provider  albuterol (PROVENTIL) (2.5 MG/3ML) 0.083% nebulizer solution Take 2.5 mg by nebulization every 6 (six) hours as needed for wheezing or shortness of breath.    [provider]  albuterol (VENTOLIN HFA) 108 (90 Base) MCG/ACT inhaler Inhale 2 puffs into the lungs every 6 (six) hours as needed for wheezing or shortness of breath. 08/22/19   Karamalegos, Devonne Doughty, DO  aspirin EC 81 MG tablet Take 81 mg by mouth daily.    [provider]  atorvastatin (LIPITOR) 80 MG tablet Take 80 mg by mouth daily.    [provider]  carvedilol (COREG) 3.125 MG tablet Take 1 tablet (3.125 mg total) by mouth 2 (two) times daily. 02/08/20 05/08/20  Marrianne Mood D, PA-C  cloNIDine (CATAPRES) 0.1 MG tablet Take 0.1 mg by mouth 2 (two) times daily.    [provider]  clopidogrel (PLAVIX) 75 MG tablet Take 1 tablet (75 mg total) by mouth daily. 05/01/20   Eileen Stanford, PA-C  diclofenac Sodium (VOLTAREN) 1 % GEL Apply 2 g topically in the morning and at bedtime.    [provider]  gabapentin (NEURONTIN) 100 MG capsule Take 100-200 mg by mouth at bedtime.     [provider]  hydrochlorothiazide (HYDRODIURIL) 25 MG tablet Take 1 tablet (25 mg total) by mouth daily. 02/08/20   Marrianne Mood D, PA-C  insulin glargine (LANTUS) 100 unit/mL SOPN Inject 20 Units into the skin at bedtime.    [provider]  metFORMIN (GLUCOPHAGE) 500 MG  tablet Take 1,000 mg by mouth 2 (two) times daily.  11/04/18   [provider]  pantoprazole (PROTONIX) 40 MG tablet Take 1 tablet (40 mg total) by mouth 2 (two) times daily. 12/06/19 12/05/20  [name], [name], FNP  SPIRIVA HANDIHALER 18 MCG inhalation capsule INHALE CONTENTS OF 1 CAPSULE ONCE DAILY Patient taking differently: Place 18 mcg into inhaler and inhale daily.  03/04/20   [name], [name], FNP  SYMBICORT 80-4.5 MCG/ACT inhaler INHALE 2 PUFFS BY MOUTH TWICE DAILY Patient taking differently: Inhale 2 puffs into the lungs 2 (two) times daily.  08/17/19   Olin Hauser, DO    Allergies Codeine  Family History  Problem Relation Age of Onset   Breast cancer Mother    Cancer Father        Black Lung   Cerebral palsy Brother    Tuberculosis Paternal Uncle    Prostate cancer Neg Hx    Kidney cancer Neg Hx    Bladder Cancer Neg Hx     Social History Social History   Tobacco Use  Smoking status: Current Some Day Smoker    Packs/day: 0.25    Years: 68.00    Pack years: 17.00    Types: Cigarettes   Smokeless tobacco: Former Systems developer    Types: Chew, Snuff    Quit date: 2000   Tobacco comment: 5 cigs daily 01/29/20/ER  Vaping Use   Vaping Use: Never used  Substance Use Topics   Alcohol use: Not Currently    Alcohol/week: 0.0 - 1.0 standard drinks    Comment: quit 40years ago   Drug use: No    Review of Systems  Constitutional: No fever/chills Eyes: No visual changes. ENT: No sore throat. Cardiovascular: Denies chest pain. Respiratory: Denies shortness of breath. Gastrointestinal: abdominal pain.  nausea, vomiting.  No diarrhea.  No constipation. Genitourinary: Negative for dysuria. Musculoskeletal: Negative for back pain. Skin: Negative for rash. Neurological: Negative for headaches, focal weakness   ____________________________________________   PHYSICAL EXAM:  VITAL SIGNS: ED Triage Vitals  Enc Vitals Group     BP 05/17/20 1830 (!)  183/65     Pulse Rate 05/17/20 1830 63     Resp 05/17/20 1830 20     Temp 05/17/20 1830 98.7 F (37.1 C)     Temp Source 05/17/20 1830 Oral     SpO2 05/17/20 1830 98 %     Weight 05/17/20 1835 224 lb (101.6 kg)     Height 05/17/20 1835 5\' 9"  (1.753 m)     Head Circumference --      Peak Flow --      Pain Score 05/17/20 1835 8     Pain Loc --      Pain Edu? --      Excl. in [place]? --     Constitutional: Alert and oriented. Well appearing and in no acute distress. Eyes: Conjunctivae are normal.  Head: Atraumatic. Nose: No congestion/rhinnorhea. Mouth/Throat: Mucous membranes are dryish in the mouth.  Oropharynx non-erythematous. Neck: No stridor. Cardiovascular: Normal rate, regular rhythm. Grossly normal heart sounds.  Good peripheral circulation. Respiratory: Normal respiratory effort.  No retractions. Lungs CTAB. Gastrointestinal: Soft somewhat tender to palpation no distention there is a firm mass in the right upper quadrant. No abdominal bruits. No CVA tenderness. Musculoskeletal: No lower extremity tenderness nor edema.   Neurologic:  Normal speech and language. No gross focal neurologic deficits are appreciated.  Skin:  Skin is warm, dry and intact. No rash noted.   ____________________________________________   LABS (all labs ordered are listed, but only abnormal results are displayed)  Labs Reviewed  COMPREHENSIVE METABOLIC PANEL - Abnormal; Notable for the following components:      Result Value   Glucose, Bld 257 (*)    BUN 29 (*)    AST 273 (*)    ALT 244 (*)    Alkaline Phosphatase 446 (*)    Total Bilirubin 2.0 (*)    All other components within normal limits  CBC - Abnormal; Notable for the following components:   Hemoglobin 11.3 (*)    HCT 35.4 (*)    MCV 79.7 (*)    MCH 25.5 (*)    RDW 18.2 (*)    All other components within normal limits  URINALYSIS, COMPLETE (UACMP) WITH MICROSCOPIC - Abnormal; Notable for the following components:   Color, Urine  YELLOW (*)    APPearance CLEAR (*)    Glucose, UA >=500 (*)    Protein, ur 100 (*)    All other components within normal limits  SARS CORONAVIRUS 2 BY  RT PCR (HOSPITAL ORDER, [facility])  LIPASE, BLOOD  LACTIC ACID, PLASMA  LACTIC ACID, PLASMA   ____________________________________________  EKG   ____________________________________________  RADIOLOGY  ED MD interpretation: CT read by radiology reviewed by me shows that his pancreatic stent has migrated and is folded over on itself. Ultrasound also read by radiology reviewed by me was really unrevealing for the cause of his nausea and vomiting Official radiology report(s): CT ABDOMEN PELVIS W CONTRAST  Result Date: 05/18/2020 CLINICAL DATA:  Nausea and vomiting generalized abdominal pain EXAM: CT ABDOMEN AND PELVIS WITH CONTRAST TECHNIQUE: Multidetector CT imaging of the abdomen and pelvis was performed using the standard protocol following bolus administration of intravenous contrast. CONTRAST:  177mL OMNIPAQUE IOHEXOL 300 MG/ML  SOLN COMPARISON:  Ultrasound 05/07/2020, CT abdomen pelvis 02/18/2020, CT 03/11/2020 FINDINGS: Lower chest: Lung bases demonstrate no acute consolidation or pleural effusion. Borderline cardiomegaly. Interval aortic valve replacement. Hepatobiliary: Hepatic steatosis. Status post cholecystectomy. No intra hepatic biliary dilatation. The previously noted biliary stent has migrated into the pancreaticojejunostomy and is folded back upon itself. The distal portion of the stent is within the proximal small bowel. An additional stent in the right upper quadrant is unchanged. Pancreas: Status post Whipple procedure. No inflammatory change of the body and tail of pancreas. Coarse dystrophic calcification at the distal pancreas. Spleen: Normal in size without focal abnormality. Adrenals/Urinary Tract: Adrenal renal glands are within normal limits. Kidneys show no hydronephrosis. Multiple  subcentimeter hypodense renal lesions too small to further characterize. Stable partially calcified lesion posterior to the upper pole of left kidney. Urinary bladder is unremarkable. Stomach/Bowel: Postsurgical changes of the stomach. No evidence for bowel obstruction. No bowel wall thickening. Appendix not seen but no right lower quadrant inflammatory process. Vascular/Lymphatic: Extensive aortic atherosclerosis. No aneurysm. No suspicious adenopathy. Reproductive: Clips at the prostate.  Prostate calcifications. Other: Negative for free air or free fluid. Fat containing left greater than right inguinal hernias. Musculoskeletal: No acute or significant osseous findings. IMPRESSION: 1. No CT evidence for acute intra-abdominal or pelvic abnormality. 2. Status post partial pancreatectomy. The previously noted biliary stent has migrated into the pancreaticojejunostomy and is folded back upon itself. 3. Hepatic steatosis. 4. Fat containing left greater than right inguinal hernias. 5. Aortic atherosclerosis. Aortic Atherosclerosis (ICD10-I70.0). Electronically Signed   By: Donavan Foil M.D.   On: 05/18/2020 03:51   US Abdomen Limited RUQ  Result Date: 05/17/2020 CLINICAL DATA:  Nausea and vomiting x3 days. History of prior cholecystectomy. EXAM: ULTRASOUND ABDOMEN LIMITED RIGHT UPPER QUADRANT COMPARISON:  None. FINDINGS: Gallbladder: The gallbladder is surgically absent. Common bile duct: Diameter: 11.6 mm (proximal) and 14.2 mm (distally) Liver: No focal lesion identified. There is diffusely increased echogenicity of the liver parenchyma. Portal vein is patent on color Doppler imaging with normal direction of blood flow towards the liver. Other: None. IMPRESSION: 1. Findings consistent with history of prior cholecystectomy. 2. Fatty liver. Electronically Signed   By: Virgina Norfolk M.D.   On: 05/17/2020 21:03    ____________________________________________   PROCEDURES  Procedure(s) performed (including  Critical Care):  Procedures   ____________________________________________   INITIAL IMPRESSION / ASSESSMENT AND PLAN / ED COURSE  Discussed patient initially with Dr. Verl Blalock gastroenterology who feels that the patient should have his folded still addressed this is likely to be causing his problems.  I then called [facility].  [facility] has a several day waiting list.  They will talk to their gastroenterology and general medicine and likely admit him  once he is off the waiting list there.  The meantime I will also talk to Zacarias Pontes.             ____________________________________________   FINAL CLINICAL IMPRESSION(S) / ED DIAGNOSES  Final diagnoses:  Nausea and vomiting     ED Discharge Orders    None       Note:  This document was prepared using Dragon voice recognition software and may include unintentional dictation errors.    Nena Polio, MD 05/18/20 [phone]

## 2020-05-20 ENCOUNTER — Telehealth: Payer: Self-pay

## 2020-05-20 NOTE — Telephone Encounter (Signed)
Hello Nathaniel Little Just an Nathaniel Little today was the third unsuccessful outreach

## 2020-05-20 NOTE — Telephone Encounter (Signed)
Copied from [place] [id number]. Topic: Quick Communication - Rx Refill/Question >> May 20, 2020  1:33 PM Bennett Scrape wrote: [place] / questions about medication / please advise >> May 20, 2020  3:11 PM Celene Kras wrote: Juliann Mule calling again stating that she is leaving soon and is requesting to speak with nurse before. Please advise.     I spoke with  Juliann Mule from [facility] and updated her on the patient current medication list.

## 2020-05-20 NOTE — Chronic Care Management (AMB) (Signed)
  Care Management   Note  05/20/2020 Name: Nathaniel Little MRN: [id number] DOB: Feb 13, 1946  Nathaniel Little is a 74 y.o. year old male who is a primary care patient of Lorine Bears, [name], [place] and is actively engaged with the care management team. I reached out to [name] by phone today to assist with re-scheduling a follow up visit with the Pharmacist  Follow up plan: Unable to make contact on outreach attempts x 3. PCP Cyndia Skeeters FNP and Pharm D  notified via routed documentation in medical record.   Noreene Larsson, [place], [place], [place] [address] Direct Dial: [phone] [email or web address] Website: [email or web address]

## 2020-05-21 ENCOUNTER — Telehealth: Payer: Self-pay | Admitting: *Deleted

## 2020-05-21 NOTE — Telephone Encounter (Signed)
I contacted referring provider that patient is not responding to phone calls.  I asked them to contact patient but they stated that would not be helpful.  I called Nathaniel Little again to schedule but again was not able to reach him.  I did leave a vm message with my name and phone number to call.

## 2020-05-25 ENCOUNTER — Emergency Department: Payer: Medicare Other

## 2020-05-25 ENCOUNTER — Other Ambulatory Visit: Payer: Self-pay

## 2020-05-25 ENCOUNTER — Emergency Department
Admission: EM | Admit: 2020-05-25 | Discharge: 2020-05-25 | Disposition: A | Discharge status: Other Acute Inpatient Hospital | Payer: Medicare Other | Attending: Emergency Medicine | Admitting: Emergency Medicine

## 2020-05-25 ENCOUNTER — Ambulatory Visit (HOSPITAL_COMMUNITY)
Admission: AD | Admit: 2020-05-25 | Discharge: 2020-05-25 | Disposition: A | Discharge status: Home / Self Care | Payer: Medicare Other | Source: Other Acute Inpatient Hospital | Attending: Emergency Medicine | Admitting: Emergency Medicine

## 2020-05-25 DIAGNOSIS — Z794 Long term (current) use of insulin: Secondary | ICD-10-CM | POA: Diagnosis not present

## 2020-05-25 DIAGNOSIS — Z7982 Long term (current) use of aspirin: Secondary | ICD-10-CM | POA: Insufficient documentation

## 2020-05-25 DIAGNOSIS — I10 Essential (primary) hypertension: Secondary | ICD-10-CM | POA: Diagnosis not present

## 2020-05-25 DIAGNOSIS — F17210 Nicotine dependence, cigarettes, uncomplicated: Secondary | ICD-10-CM | POA: Diagnosis not present

## 2020-05-25 DIAGNOSIS — J810 Acute pulmonary edema: Secondary | ICD-10-CM | POA: Diagnosis present

## 2020-05-25 DIAGNOSIS — A419 Sepsis, unspecified organism: Secondary | ICD-10-CM | POA: Insufficient documentation

## 2020-05-25 DIAGNOSIS — Z20822 Contact with and (suspected) exposure to covid-19: Secondary | ICD-10-CM | POA: Diagnosis not present

## 2020-05-25 DIAGNOSIS — J441 Chronic obstructive pulmonary disease with (acute) exacerbation: Secondary | ICD-10-CM | POA: Insufficient documentation

## 2020-05-25 DIAGNOSIS — K859 Acute pancreatitis without necrosis or infection, unspecified: Secondary | ICD-10-CM | POA: Diagnosis not present

## 2020-05-25 DIAGNOSIS — Z79899 Other long term (current) drug therapy: Secondary | ICD-10-CM | POA: Insufficient documentation

## 2020-05-25 DIAGNOSIS — E876 Hypokalemia: Secondary | ICD-10-CM | POA: Diagnosis not present

## 2020-05-25 DIAGNOSIS — E872 Acidosis, unspecified: Secondary | ICD-10-CM

## 2020-05-25 DIAGNOSIS — E119 Type 2 diabetes mellitus without complications: Secondary | ICD-10-CM | POA: Diagnosis not present

## 2020-05-25 DIAGNOSIS — I509 Heart failure, unspecified: Secondary | ICD-10-CM | POA: Insufficient documentation

## 2020-05-25 DIAGNOSIS — R109 Unspecified abdominal pain: Secondary | ICD-10-CM | POA: Diagnosis present

## 2020-05-25 LAB — COMPREHENSIVE METABOLIC PANEL
ALT: 65 U/L — ABNORMAL HIGH (ref 0–44)
AST: 33 U/L (ref 15–41)
Albumin: 3.5 g/dL (ref 3.5–5.0)
Alkaline Phosphatase: 233 U/L — ABNORMAL HIGH (ref 38–126)
Anion gap: 13 (ref 5–15)
BUN: 20 mg/dL (ref 8–23)
CO2: 25 mmol/L (ref 22–32)
Calcium: 9.2 mg/dL (ref 8.9–10.3)
Chloride: 96 mmol/L — ABNORMAL LOW (ref 98–111)
Creatinine, Ser: 1 mg/dL (ref 0.61–1.24)
GFR calc Af Amer: >60 mL/min (ref >60)
GFR calc non Af Amer: >60 mL/min (ref >60)
Glucose, Bld: 226 mg/dL — ABNORMAL HIGH (ref 70–99)
Potassium: 3.4 mmol/L — ABNORMAL LOW (ref 3.5–5.1)
Sodium: 134 mmol/L — ABNORMAL LOW (ref 135–145)
Total Bilirubin: 1.5 mg/dL — ABNORMAL HIGH (ref 0.3–1.2)
Total Protein: 7.4 g/dL (ref 6.5–8.1)

## 2020-05-25 LAB — CBC WITH DIFFERENTIAL/PLATELET
Abs Immature Granulocytes: 0.19 10*3/uL — ABNORMAL HIGH (ref 0.00–0.07)
Basophils Absolute: 0.1 10*3/uL (ref 0.0–0.1)
Basophils Relative: 0 %
Eosinophils Absolute: 0 10*3/uL (ref 0.0–0.5)
Eosinophils Relative: 0 %
HCT: 37.1 % — ABNORMAL LOW (ref 39.0–52.0)
Hemoglobin: 12.5 g/dL — ABNORMAL LOW (ref 13.0–17.0)
Immature Granulocytes: 1 %
Lymphocytes Relative: 2 %
Lymphs Abs: 0.5 10*3/uL — ABNORMAL LOW (ref 0.7–4.0)
MCH: 26.1 pg (ref 26.0–34.0)
MCHC: 33.7 g/dL (ref 30.0–36.0)
MCV: 77.5 fL — ABNORMAL LOW (ref 80.0–100.0)
Monocytes Absolute: 1.2 10*3/uL — ABNORMAL HIGH (ref 0.1–1.0)
Monocytes Relative: 5 %
Neutro Abs: 21 10*3/uL — ABNORMAL HIGH (ref 1.7–7.7)
Neutrophils Relative %: 92 %
Platelets: 266 10*3/uL (ref 150–400)
RBC: 4.79 MIL/uL (ref 4.22–5.81)
RDW: 19.4 % — ABNORMAL HIGH (ref 11.5–15.5)
WBC: 22.9 10*3/uL — ABNORMAL HIGH (ref 4.0–10.5)
nRBC: 0 % (ref 0.0–0.2)

## 2020-05-25 LAB — URINALYSIS, COMPLETE (UACMP) WITH MICROSCOPIC
Bilirubin Urine: NEGATIVE
Glucose, UA: NEGATIVE mg/dL
Hgb urine dipstick: NEGATIVE
Ketones, ur: NEGATIVE mg/dL
Leukocytes,Ua: NEGATIVE
Nitrite: NEGATIVE
Protein, ur: 100 mg/dL — AB
Specific Gravity, Urine: 1.032 — ABNORMAL HIGH (ref 1.005–1.030)
pH: 5 (ref 5.0–8.0)

## 2020-05-25 LAB — BRAIN NATRIURETIC PEPTIDE: B Natriuretic Peptide: 100.5 pg/mL — ABNORMAL HIGH (ref 0.0–100.0)

## 2020-05-25 LAB — LACTIC ACID, PLASMA
Lactic Acid, Venous: 2 mmol/L (ref 0.5–1.9)
Lactic Acid, Venous: 2.5 mmol/L (ref 0.5–1.9)

## 2020-05-25 LAB — LIPASE, BLOOD: Lipase: 109 U/L — ABNORMAL HIGH (ref 11–51)

## 2020-05-25 LAB — BLOOD GAS, VENOUS
Acid-Base Excess: 3.8 mmol/L — ABNORMAL HIGH (ref 0.0–2.0)
Bicarbonate: 29.7 mmol/L — ABNORMAL HIGH (ref 20.0–28.0)
O2 Saturation: 49.6 %
Patient temperature: 37
pCO2, Ven: 49 mmHg (ref 44.0–60.0)
pH, Ven: 7.39 (ref 7.250–7.430)
pO2, Ven: <31 mmHg — CL (ref 32.0–45.0)

## 2020-05-25 LAB — PROCALCITONIN: Procalcitonin: 0.23 ng/mL

## 2020-05-25 LAB — SARS CORONAVIRUS 2 BY RT PCR (HOSPITAL ORDER, PERFORMED IN ~~LOC~~ HOSPITAL LAB): SARS Coronavirus 2: NEGATIVE

## 2020-05-25 MED ORDER — LACTATED RINGERS IV BOLUS (SEPSIS)
1000.0000 mL | Freq: Once | INTRAVENOUS | Status: DC
  Administered 2020-05-25: 1000 mL via INTRAVENOUS

## 2020-05-25 MED ORDER — FENTANYL CITRATE (PF) 100 MCG/2ML IJ SOLN
50.0000 ug | Freq: Once | INTRAMUSCULAR | Status: AC
  Administered 2020-05-25: 50 ug via INTRAVENOUS

## 2020-05-25 MED ORDER — FENTANYL CITRATE (PF) 100 MCG/2ML IJ SOLN
50.0000 ug | Freq: Once | INTRAMUSCULAR | Status: AC
  Administered 2020-05-25: 50 ug via INTRAVENOUS
  Filled 2020-05-25: qty 2

## 2020-05-25 MED ORDER — VANCOMYCIN HCL IN DEXTROSE 1-5 GM/200ML-% IV SOLN
1000.0000 mg | Freq: Once | INTRAVENOUS | Status: AC
  Administered 2020-05-25: 1000 mg via INTRAVENOUS
  Filled 2020-05-25: qty 200

## 2020-05-25 MED ORDER — LACTATED RINGERS IV BOLUS (SEPSIS): 1000.0000 mL | Freq: Once | INTRAVENOUS | Status: DC

## 2020-05-25 MED ORDER — IOHEXOL 350 MG/ML SOLN
100.0000 mL | Freq: Once | INTRAVENOUS | Status: AC | PRN
  Administered 2020-05-25: 100 mL via INTRAVENOUS

## 2020-05-25 MED ORDER — LACTATED RINGERS IV BOLUS (SEPSIS)
1000.0000 mL | Freq: Once | INTRAVENOUS | Status: AC
  Administered 2020-05-25: 1000 mL via INTRAVENOUS

## 2020-05-25 MED ORDER — METRONIDAZOLE IN NACL 5-0.79 MG/ML-% IV SOLN
500.0000 mg | Freq: Once | INTRAVENOUS | Status: AC
  Administered 2020-05-25: 500 mg via INTRAVENOUS
  Filled 2020-05-25: qty 100

## 2020-05-25 MED ORDER — FENTANYL CITRATE (PF) 100 MCG/2ML IJ SOLN
INTRAMUSCULAR | Status: AC
  Filled 2020-05-25: qty 2

## 2020-05-25 MED ORDER — LACTATED RINGERS IV SOLN: INTRAVENOUS | Status: DC

## 2020-05-25 MED ORDER — SODIUM CHLORIDE 0.9 % IV SOLN
2.0000 g | Freq: Once | INTRAVENOUS | Status: AC
  Administered 2020-05-25: 2 g via INTRAVENOUS
  Filled 2020-05-25: qty 2

## 2020-05-25 MED ORDER — METHYLPREDNISOLONE SODIUM SUCC 125 MG IJ SOLR
125.0000 mg | Freq: Once | INTRAMUSCULAR | Status: AC
  Administered 2020-05-25: 125 mg via INTRAVENOUS
  Filled 2020-05-25: qty 2

## 2020-05-25 MED ORDER — FENTANYL CITRATE (PF) 100 MCG/2ML IJ SOLN
25.0000 ug | Freq: Once | INTRAMUSCULAR | Status: AC
  Administered 2020-05-25: 25 ug via INTRAVENOUS
  Filled 2020-05-25: qty 2

## 2020-05-25 MED ORDER — IPRATROPIUM-ALBUTEROL 0.5-2.5 (3) MG/3ML IN SOLN
3.0000 mL | Freq: Once | RESPIRATORY_TRACT | Status: AC
  Administered 2020-05-25: 3 mL via RESPIRATORY_TRACT
  Filled 2020-05-25: qty 3

## 2020-05-25 NOTE — ED Triage Notes (Signed)
Pt arrives via [facility] from [facility] assisted living with complaint of ULQ abdominal pain radiating to back since last night, worsening this AM. Last meal at approx 2100, no history of same  Pt with history of COPD, not on o2 at home

## 2020-05-25 NOTE — ED Provider Notes (Signed)
[facility] Emergency Department Provider Note  ____________________________________________   First MD Initiated Contact with Patient 05/25/20 [phone]     (approximate)  I have reviewed the triage vital signs and the nursing notes.   HISTORY  Chief Complaint Abdominal Pain   HPI Nathaniel Little is a 74 y.o. male  with a history of chronic diastolicCHF, longstanding tobacco abuse with COPD, type 2 diabetes mellitus without complications,HTN, left lower lobe pulmonary nodule suspicious for bronchogenic neoplasm,OSAon CPAP, obesity, and intraductal papillary mucinous neoplasm of the pancreas s/pWhipple procedure in 2019 and ERCP on 8/16 with removal of biliary stent as well as severe ASs/p TAVR (04/01/20) who presents for assessment of acute onset of left-sided flank pain rating to the back that began last night.  Patient states he feels similar to how he has with kidney stones he has had in the past.  No clear alleviating aggravating factors.  He does endorse some shortness of breath and cough but is not sure how long this has been going on.  He denies any fevers, headache, earache, sore throat, vomiting, diarrhea, dysuria, blood in stool, blood in his urine, pain in the right side of his abdomen, extremity pain, rash, or other acute complaints.  No clear alleviating or aggravating factors.  No prior similar episodes.  Patient denies EtOH or illicit drug use.   Past Medical History:  Diagnosis Date  . (HFpEF) heart failure with preserved ejection fraction ([place])    a. 08/2017 Echo: EF 60-65%, Gr1 DD.  [name] Arthritis   . COPD (chronic obstructive pulmonary disease) ([place])   . Depression   . Diabetes mellitus without complication ([place])   . Hypertension   . Incidental pulmonary nodule, greater than or equal to 63mm 03/11/2020   Left lower lobe - suspicious for bronchogenic neoplasm  . Lymphedema   . Morbid obesity ([place])   . Prostate cancer ([place]) 03/2013   ?  PANCREATIC  . S/P TAVR (transcatheter aortic valve replacement) 04/01/2020   s/p TAVR with a 29 mm Edwards Sapien 3 THV via the TF approach by Drs Buena Irish & Roxy Manns  . Severe aortic stenosis   . Sleep apnea with use of continuous positive airway pressure (CPAP)   . Tobacco abuse   . Tremor    HEAD    Patient Active Problem List   Diagnosis Date Noted  . Dark stools 04/17/2020  . BRBPR (bright red blood per rectum) 04/17/2020  . S/P TAVR (transcatheter aortic valve replacement) 04/01/2020  . (HFpEF) heart failure with preserved ejection fraction ([place])   . Diabetes mellitus without complication ([place])   . Incidental pulmonary nodule, greater than or equal to 56mm 03/11/2020  . Duodenitis 02/18/2020  . Diabetes ([place]) 12/06/2019  . Erectile dysfunction due to arterial insufficiency 09/24/2019  . Lumbar degenerative disc disease 06/25/2019  . Sacroiliac joint dysfunction of both sides 06/25/2019  . Chronic heart failure with preserved ejection fraction ([place]) 01/19/2019  . Tobacco abuse 01/19/2019  . Severe aortic stenosis   . Hyperlipidemia 07/20/2018  . IPMN (intraductal papillary mucinous neoplasm) 12/24/2017  . Claudication in peripheral vascular disease ([place]) 08/22/2017  . Pancreatic mass 08/20/2017  . Aortic atherosclerosis ([place]) 07/25/2017  . BMI 38.0-38.9,adult 02/09/2017  . Arthritis pain of hip 01/07/2017  . Chronic obstructive pulmonary disease ([place]) 01/07/2017  . GERD (gastroesophageal reflux disease) 01/07/2017  . Essential hypertension 01/07/2017  . Depression 01/06/2017  . Sleep apnea with use of continuous positive airway pressure (CPAP) 01/06/2017  . Personal history  of prostate cancer 03/04/2013    Past Surgical History:  Procedure Laterality Date  . ADENOIDECTOMY    . APPENDECTOMY    . CARDIAC CATHETERIZATION    . CATARACT EXTRACTION W/PHACO Left 11/28/2018   Procedure: CATARACT EXTRACTION PHACO AND INTRAOCULAR LENS PLACEMENT (IOC) LEFT, DIABETIC;  Surgeon:  Birder Robson, MD;  Location: [facility] ORS;  Service: Ophthalmology;  Laterality: Left;  [name]  00:56 CDE 9.44 Fluid pack lot # [id number]  . EUS N/A 10/13/2017   Procedure: FULL UPPER ENDOSCOPIC ULTRASOUND (EUS) RADIAL;  Surgeon: Holly Bodily, MD;  Location: [facility] ENDOSCOPY;  Service: Gastroenterology;  Laterality: N/A;  . PANCREATICODUODENECTOMY  07/23/2018  . RIGHT/LEFT HEART CATH AND CORONARY ANGIOGRAPHY N/A 02/21/2020   Procedure: RIGHT/LEFT HEART CATH AND CORONARY ANGIOGRAPHY;  Surgeon: Minna Merritts, MD;  Location: [place] [facility] LAB;  Service: Cardiovascular;  Laterality: N/A;  . TEE WITHOUT CARDIOVERSION N/A 04/01/2020   Procedure: TRANSESOPHAGEAL ECHOCARDIOGRAM (TEE);  Surgeon: Burnell Blanks, MD;  Location: [place] [facility] LAB;  Service: Open Heart Surgery;  Laterality: N/A;  . TONSILLECTOMY    . TRANSCATHETER AORTIC VALVE REPLACEMENT, TRANSFEMORAL N/A 04/01/2020   Procedure: TRANSCATHETER AORTIC VALVE REPLACEMENT, TRANSFEMORAL;  Surgeon: Burnell Blanks, MD;  Location: [place] [facility];  Service: Open Heart Surgery;  Laterality: N/A;  . wipple      Prior to Admission medications   Medication Sig Start Date End Date Taking? Authorizing Provider  albuterol (PROVENTIL) (2.5 MG/3ML) 0.083% nebulizer solution Take 2.5 mg by nebulization every 6 (six) hours as needed for wheezing or shortness of breath.    [provider]  albuterol (VENTOLIN HFA) 108 (90 Base) MCG/ACT inhaler Inhale 2 puffs into the lungs every 6 (six) hours as needed for wheezing or shortness of breath. 08/22/19   Karamalegos, Devonne Doughty, DO  aspirin EC 81 MG tablet Take 81 mg by mouth daily.    [provider]  atorvastatin (LIPITOR) 80 MG tablet Take 80 mg by mouth daily.    [provider]  carvedilol (COREG) 3.125 MG tablet Take 1 tablet (3.125 mg total) by mouth 2 (two) times daily. 02/08/20 05/08/20  Marrianne Mood D, PA-C  cloNIDine (CATAPRES) 0.1 MG tablet Take  0.1 mg by mouth 2 (two) times daily.    [provider]  clopidogrel (PLAVIX) 75 MG tablet Take 1 tablet (75 mg total) by mouth daily. 05/01/20   Eileen Stanford, PA-C  diclofenac Sodium (VOLTAREN) 1 % GEL Apply 2 g topically in the morning and at bedtime.    [provider]  gabapentin (NEURONTIN) 100 MG capsule Take 100-200 mg by mouth at bedtime.     [provider]  hydrochlorothiazide (HYDRODIURIL) 25 MG tablet Take 1 tablet (25 mg total) by mouth daily. 02/08/20   Marrianne Mood D, PA-C  insulin glargine (LANTUS) 100 unit/mL SOPN Inject 20 Units into the skin at bedtime.    [provider]  metFORMIN (GLUCOPHAGE) 500 MG tablet Take 1,000 mg by mouth 2 (two) times daily.  11/04/18   [provider]  pantoprazole (PROTONIX) 40 MG tablet Take 1 tablet (40 mg total) by mouth 2 (two) times daily. 12/06/19 12/05/20  [name], [name], FNP  SPIRIVA HANDIHALER 18 MCG inhalation capsule INHALE CONTENTS OF 1 CAPSULE ONCE DAILY Patient taking differently: Place 18 mcg into inhaler and inhale daily.  03/04/20   [name], [name], FNP  SYMBICORT 80-4.5 MCG/ACT inhaler INHALE 2 PUFFS BY MOUTH TWICE DAILY Patient taking differently: Inhale 2  puffs into the lungs 2 (two) times daily.  08/17/19   Karamalegos, Devonne Doughty, DO    Allergies Codeine  Family History  Problem Relation Age of Onset  . Breast cancer Mother   . Cancer Father        Black Lung  . Cerebral palsy Brother   . Tuberculosis Paternal Uncle   . Prostate cancer Neg Hx   . Kidney cancer Neg Hx   . Bladder Cancer Neg Hx     Social History Social History   Tobacco Use  . Smoking status: Current Some Day Smoker    Packs/day: 0.25    Years: 68.00    Pack years: 17.00    Types: Cigarettes  . Smokeless tobacco: Former Systems developer    Types: Chew, [place] date: 2000  . Tobacco comment: 5 cigs daily 01/29/20/ER  Vaping Use  . Vaping Use: Never used  Substance Use Topics  . Alcohol use: Not  Currently    Alcohol/week: 0.0 - 1.0 standard drinks    Comment: quit 40years ago  . Drug use: No    Review of Systems  Review of Systems  Constitutional: Negative for chills and fever.  HENT: Negative for sore throat.   Eyes: Negative for pain.  Respiratory: Positive for cough. Negative for stridor.   Cardiovascular: Negative for chest pain.  Gastrointestinal: Positive for abdominal pain. Negative for vomiting.  Skin: Negative for rash.  Neurological: Negative for seizures, loss of consciousness and headaches.  Psychiatric/Behavioral: Negative for suicidal ideas.  All other systems reviewed and are negative.     ____________________________________________   PHYSICAL EXAM:  VITAL SIGNS: ED Triage Vitals  Enc Vitals Group     BP      Pulse      Resp      Temp      Temp src      SpO2      Weight      Height      Head Circumference      Peak Flow      Pain Score      Pain Loc      Pain Edu?      Excl. in [place]?    Vitals:   05/25/20 1010 05/25/20 1601  BP: 104/77 122/65  Pulse: 79 82  Resp: (!) 29 (!) 24  Temp: 98.2 F (36.8 C) 98.7 F (37.1 C)  SpO2: 93% 98%   Physical Exam Vitals and nursing note reviewed.  Constitutional:      Appearance: Normal appearance. He is well-developed and normal weight.  HENT:     Head: Normocephalic and atraumatic.     Right Ear: External ear normal.     Left Ear: External ear normal.     Nose: Nose normal.     Mouth/Throat:     Mouth: Mucous membranes are moist.  Eyes:     Conjunctiva/sclera: Conjunctivae normal.  Cardiovascular:     Rate and Rhythm: Normal rate and regular rhythm.     Heart sounds: No murmur heard.   Pulmonary:     Effort: Pulmonary effort is normal. Tachypnea present. No respiratory distress.     Breath sounds: Examination of the right-lower field reveals rhonchi. Examination of the left-lower field reveals rhonchi. Wheezing and rhonchi present.  Abdominal:     Palpations: Abdomen is soft.      Tenderness: There is abdominal tenderness in the left upper quadrant and left lower quadrant. There is no right CVA  tenderness or left CVA tenderness.  Musculoskeletal:     Cervical back: Neck supple.  Skin:    General: Skin is warm and dry.     Capillary Refill: Capillary refill takes less than 2 seconds.  Neurological:     Mental Status: He is alert and oriented to person, place, and time.  Psychiatric:        Mood and Affect: Mood normal.      ____________________________________________   LABS (all labs ordered are listed, but only abnormal results are displayed)  Labs Reviewed  CBC WITH DIFFERENTIAL/PLATELET - Abnormal; Notable for the following components:      Result Value   WBC 22.9 (*)    Hemoglobin 12.5 (*)    HCT 37.1 (*)    MCV 77.5 (*)    RDW 19.4 (*)    Neutro Abs 21.0 (*)    Lymphs Abs 0.5 (*)    Monocytes Absolute 1.2 (*)    Abs Immature Granulocytes 0.19 (*)    All other components within normal limits  COMPREHENSIVE METABOLIC PANEL - Abnormal; Notable for the following components:   Sodium 134 (*)    Potassium 3.4 (*)    Chloride 96 (*)    Glucose, Bld 226 (*)    ALT 65 (*)    Alkaline Phosphatase 233 (*)    Total Bilirubin 1.5 (*)    All other components within normal limits  LIPASE, BLOOD - Abnormal; Notable for the following components:   Lipase 109 (*)    All other components within normal limits  URINALYSIS, COMPLETE (UACMP) WITH MICROSCOPIC - Abnormal; Notable for the following components:   Color, Urine AMBER (*)    APPearance CLOUDY (*)    Specific Gravity, Urine 1.032 (*)    Protein, ur 100 (*)    Bacteria, UA RARE (*)    All other components within normal limits  LACTIC ACID, PLASMA - Abnormal; Notable for the following components:   Lactic Acid, Venous 2.0 (*)    All other components within normal limits  LACTIC ACID, PLASMA - Abnormal; Notable for the following components:   Lactic Acid, Venous 2.5 (*)    All other components within  normal limits  BRAIN NATRIURETIC PEPTIDE - Abnormal; Notable for the following components:   B Natriuretic Peptide 100.5 (*)    All other components within normal limits  BLOOD GAS, VENOUS - Abnormal; Notable for the following components:   pO2, Ven <31.0 (*)    Bicarbonate 29.7 (*)    Acid-Base Excess 3.8 (*)    All other components within normal limits  SARS CORONAVIRUS 2 BY RT PCR (HOSPITAL ORDER, [place] [facility])  CULTURE, BLOOD (ROUTINE X 2)  CULTURE, BLOOD (ROUTINE X 2)  PROCALCITONIN   ____________________________________________  EKG  Sinus rhythm with a ventricular rate of 73, left axis deviation, Q waves in the inferior leads and no other clear evidence of acute ischemia or other significant underlying arrhythmia. ____________________________________________  RADIOLOGY  Official radiology report(s): DG Chest 2 View  Result Date: 05/25/2020 CLINICAL DATA:  Cough EXAM: CHEST - 2 VIEW COMPARISON:  04/01/2020 FINDINGS: Indistinct streaky density at the bases. There is baseline chronic interstitial coarsening with bronchitic findings on June 2021 CT. Borderline cardiomegaly. Transcatheter aortic valve replacement. No visible effusion or pneumothorax. Spondylosis with multi-level bridging. IMPRESSION: Mild atelectasis or infiltrates at the lung bases. Background chronic bronchitic markings. Electronically Signed   By: Monte Fantasia M.D.   On: 05/25/2020 11:18  CT Angio Chest PE W and/or Wo Contrast  Result Date: 05/25/2020 CLINICAL DATA:  COPD. Left upper quadrant abdominal pain radiating to the back since last night. History of Whipple procedure in 2019. History of appendectomy. EXAM: CT ANGIOGRAPHY CHEST CT ABDOMEN AND PELVIS WITH CONTRAST TECHNIQUE: Multidetector CT imaging of the chest was performed using the standard protocol during bolus administration of intravenous contrast. Multiplanar CT image reconstructions and MIPs were obtained to evaluate the  vascular anatomy. Multidetector CT imaging of the abdomen and pelvis was performed using the standard protocol during bolus administration of intravenous contrast. CONTRAST:  166mL OMNIPAQUE IOHEXOL 350 MG/ML SOLN COMPARISON:  03/11/2020 CT angiogram of the chest, abdomen and pelvis. FINDINGS: CTA CHEST FINDINGS Cardiovascular: The study is moderate quality for the evaluation of pulmonary embolism, with some motion degradation. There are no convincing filling defects in the central, lobar, segmental or subsegmental pulmonary artery branches to suggest acute pulmonary embolism. Aortic valve prosthesis in place. Atherosclerotic nonaneurysmal thoracic aorta. Normal caliber pulmonary arteries. Top-normal heart size. No significant pericardial fluid/thickening. Right coronary atherosclerosis. Mediastinum/Nodes: No discrete thyroid nodules. Unremarkable esophagus. No pathologically enlarged axillary, mediastinal or hilar lymph nodes. Lungs/Pleura: No pneumothorax. No pleural effusion. Mild centrilobular emphysema with diffuse bronchial wall thickening. Solid 1.5 cm medial left lower lobe pulmonary nodule (series 6/image 51), slightly increased from 1.4 cm on 03/11/2020 CT. Patchy platelike consolidation and volume loss at the dependent basilar lower lobes bilaterally, favor atelectasis. No additional significant pulmonary nodules. Musculoskeletal: No aggressive appearing focal osseous lesions. Marked thoracic spondylosis. Review of the MIP images confirms the above findings. CT ABDOMEN and PELVIS FINDINGS Hepatobiliary: Normal liver size. Suggestion of diffuse hepatic steatosis. No liver masses. Cholecystectomy. Postsurgical changes from choledochojejunostomy. No intrahepatic biliary ductal dilatation. Pancreas: Status post resection of the pancreatic head and neck with pancreaticojejunostomy. There is new prominent jejunal wall thickening with surrounding fat stranding and ill-defined fluid at the pancreatico-jejunal  anastomosis with thickening of the perianastomotic pancreatic parenchyma. The previously visualized stent in this location is no longer present. No discrete pancreatic mass. No measurable peripancreatic fluid collections. Spleen: Normal size. No mass. Adrenals/Urinary Tract: Normal adrenals. No hydronephrosis. Numerous subcentimeter hypodense renal cortical lesions scattered in both kidneys are too small to characterize, not appreciably changed and require no follow-up. Normal bladder. Stomach/Bowel: Stable postsurgical changes from distal gastrectomy with gastrojejunostomy. No acute gastric abnormality. No unexpected small bowel dilatation. No additional sites of small bowel wall thickening. Appendectomy. Normal large bowel with no diverticulosis, large bowel wall thickening or pericolonic fat stranding. Vascular/Lymphatic: Atherosclerotic nonaneurysmal abdominal aorta. Patent portal, splenic, hepatic and renal veins. Stable hepatic artery stent. No pathologically enlarged lymph nodes in the abdomen or pelvis. Reproductive: Top-normal size prostate. Fiducial markers in the prostate. Other: No pneumoperitoneum, ascites or focal fluid collection. Musculoskeletal: No aggressive appearing focal osseous lesions. Moderate lumbar spondylosis. Review of the MIP images confirms the above findings. IMPRESSION: 1. No evidence of acute pulmonary embolism. 2. Solid 1.5 cm medial left lower lobe pulmonary nodule, slightly increased in size since 03/11/2020 CT, suspicious for primary bronchogenic carcinoma as indicated on 03/17/2020 PET-CT. Multidisciplinary thoracic oncology consultation suggested. 3. Status post Whipple procedure. Prominent jejunal wall thickening with surrounding fat stranding and ill-defined fluid at the pancreatico-jejunal anastomosis with thickening of the perianastomotic pancreatic parenchyma, new since recent 05/18/2020 CT. Previously visualized stent in this location is absent on today's scan. Findings  likely represent acute pancreatitis at the pancreaticojejunostomy. No fluid collections. 4. Chronic findings include: Coronary atherosclerosis. Suggestion of diffuse hepatic  steatosis. Aortic Atherosclerosis (ICD10-I70.0) and Emphysema (ICD10-J43.9). Electronically Signed   By: Ilona Sorrel M.D.   On: 05/25/2020 14:30   CT ABDOMEN PELVIS W CONTRAST  Result Date: 05/25/2020 CLINICAL DATA:  COPD. Left upper quadrant abdominal pain radiating to the back since last night. History of Whipple procedure in 2019. History of appendectomy. EXAM: CT ANGIOGRAPHY CHEST CT ABDOMEN AND PELVIS WITH CONTRAST TECHNIQUE: Multidetector CT imaging of the chest was performed using the standard protocol during bolus administration of intravenous contrast. Multiplanar CT image reconstructions and MIPs were obtained to evaluate the vascular anatomy. Multidetector CT imaging of the abdomen and pelvis was performed using the standard protocol during bolus administration of intravenous contrast. CONTRAST:  17mL OMNIPAQUE IOHEXOL 350 MG/ML SOLN COMPARISON:  03/11/2020 CT angiogram of the chest, abdomen and pelvis. FINDINGS: CTA CHEST FINDINGS Cardiovascular: The study is moderate quality for the evaluation of pulmonary embolism, with some motion degradation. There are no convincing filling defects in the central, lobar, segmental or subsegmental pulmonary artery branches to suggest acute pulmonary embolism. Aortic valve prosthesis in place. Atherosclerotic nonaneurysmal thoracic aorta. Normal caliber pulmonary arteries. Top-normal heart size. No significant pericardial fluid/thickening. Right coronary atherosclerosis. Mediastinum/Nodes: No discrete thyroid nodules. Unremarkable esophagus. No pathologically enlarged axillary, mediastinal or hilar lymph nodes. Lungs/Pleura: No pneumothorax. No pleural effusion. Mild centrilobular emphysema with diffuse bronchial wall thickening. Solid 1.5 cm medial left lower lobe pulmonary nodule (series  6/image 51), slightly increased from 1.4 cm on 03/11/2020 CT. Patchy platelike consolidation and volume loss at the dependent basilar lower lobes bilaterally, favor atelectasis. No additional significant pulmonary nodules. Musculoskeletal: No aggressive appearing focal osseous lesions. Marked thoracic spondylosis. Review of the MIP images confirms the above findings. CT ABDOMEN and PELVIS FINDINGS Hepatobiliary: Normal liver size. Suggestion of diffuse hepatic steatosis. No liver masses. Cholecystectomy. Postsurgical changes from choledochojejunostomy. No intrahepatic biliary ductal dilatation. Pancreas: Status post resection of the pancreatic head and neck with pancreaticojejunostomy. There is new prominent jejunal wall thickening with surrounding fat stranding and ill-defined fluid at the pancreatico-jejunal anastomosis with thickening of the perianastomotic pancreatic parenchyma. The previously visualized stent in this location is no longer present. No discrete pancreatic mass. No measurable peripancreatic fluid collections. Spleen: Normal size. No mass. Adrenals/Urinary Tract: Normal adrenals. No hydronephrosis. Numerous subcentimeter hypodense renal cortical lesions scattered in both kidneys are too small to characterize, not appreciably changed and require no follow-up. Normal bladder. Stomach/Bowel: Stable postsurgical changes from distal gastrectomy with gastrojejunostomy. No acute gastric abnormality. No unexpected small bowel dilatation. No additional sites of small bowel wall thickening. Appendectomy. Normal large bowel with no diverticulosis, large bowel wall thickening or pericolonic fat stranding. Vascular/Lymphatic: Atherosclerotic nonaneurysmal abdominal aorta. Patent portal, splenic, hepatic and renal veins. Stable hepatic artery stent. No pathologically enlarged lymph nodes in the abdomen or pelvis. Reproductive: Top-normal size prostate. Fiducial markers in the prostate. Other: No  pneumoperitoneum, ascites or focal fluid collection. Musculoskeletal: No aggressive appearing focal osseous lesions. Moderate lumbar spondylosis. Review of the MIP images confirms the above findings. IMPRESSION: 1. No evidence of acute pulmonary embolism. 2. Solid 1.5 cm medial left lower lobe pulmonary nodule, slightly increased in size since 03/11/2020 CT, suspicious for primary bronchogenic carcinoma as indicated on 03/17/2020 PET-CT. Multidisciplinary thoracic oncology consultation suggested. 3. Status post Whipple procedure. Prominent jejunal wall thickening with surrounding fat stranding and ill-defined fluid at the pancreatico-jejunal anastomosis with thickening of the perianastomotic pancreatic parenchyma, new since recent 05/18/2020 CT. Previously visualized stent in this location is absent on today's scan. Findings likely  represent acute pancreatitis at the pancreaticojejunostomy. No fluid collections. 4. Chronic findings include: Coronary atherosclerosis. Suggestion of diffuse hepatic steatosis. Aortic Atherosclerosis (ICD10-I70.0) and Emphysema (ICD10-J43.9). Electronically Signed   By: Ilona Sorrel M.D.   On: 05/25/2020 14:30    ____________________________________________   PROCEDURES  Procedure(s) performed (including Critical Care):  .1-3 Lead EKG Interpretation Performed by: Lucrezia Starch, MD Authorized by: Lucrezia Starch, MD     Interpretation: normal     ECG rate assessment: normal     Rhythm: sinus rhythm     Conduction: normal   .Critical Care Performed by: Lucrezia Starch, MD Authorized by: Lucrezia Starch, MD   Critical care provider statement:    Critical care time (minutes):  120   Critical care was necessary to treat or prevent imminent or life-threatening deterioration of the following conditions:  Sepsis   Critical care was time spent personally by me on the following activities:  Discussions with consultants, evaluation of patient's response to treatment,  examination of patient, ordering and performing treatments and interventions, ordering and review of laboratory studies, ordering and review of radiographic studies, pulse oximetry, re-evaluation of patient's condition, obtaining history from patient or surrogate and review of old charts     ____________________________________________   INITIAL IMPRESSION / [place] / ED COURSE     Patient presents with [name] to history exam for assessment of left-sided abdominal pain and flank pain rating to the back.  Afebrile but tachypneic on arrival.  Exam as above.  Overall with regard to patient's abdominal pain presentation and work-up is concerning for acute pancreatitis as well as inflammation at the pancreaticoduodenal anastomosis.  Given multiple SIRS criteria with elevated white blood cell count and lactic acid of 2 code sepsis initiated in the ED and broad-spectrum antibiotics, IV fluids, and cultures were ordered.  CT does not otherwise show other foci of clear infectious process and there is no evidence on CTA chest of consolidative process, PE, significant volume overload, or other acute intrathoracic process.  Given wheezing history of COPD patient was treated for COPD exacerbation with below noted medications on reassessment had improvement in his wheezing and said he felt little bit better.  He was also treated with below noted analgesia for his abdominal pain.  Given patient had surgery performed at [facility] last year including his Whipple and ERCP this past month I did reach out to general surgery service at [facility] who accepted the patient for transfer.  Please see EMTALA documentation for further details.  We will plan to transfer for further evaluation management.    Medications  lactated ringers infusion (has no administration in time range)  lactated ringers infusion (has no administration in time range)  fentaNYL (SUBLIMAZE) injection 25 mcg (25 mcg Intravenous Given 05/25/20 1245)   ipratropium-albuterol (DUONEB) 0.5-2.5 (3) MG/3ML nebulizer solution 3 mL (3 mLs Nebulization Given 05/25/20 1246)  methylPREDNISolone sodium succinate (SOLU-MEDROL) 125 mg/2 mL injection 125 mg (125 mg Intravenous Given 05/25/20 1246)  ceFEPIme (MAXIPIME) 2 g in sodium chloride 0.9 % 100 mL IVPB (0 g Intravenous Stopped 05/25/20 1440)  metroNIDAZOLE (FLAGYL) IVPB 500 mg (0 mg Intravenous Stopped 05/25/20 1556)  vancomycin (VANCOCIN) IVPB 1000 mg/200 mL premix (0 mg Intravenous Stopped 05/25/20 1556)  lactated ringers bolus 1,000 mL (0 mLs Intravenous Stopped 05/25/20 1556)  iohexol (OMNIPAQUE) 350 MG/ML injection 100 mL (100 mLs Intravenous Contrast Given 05/25/20 1351)  fentaNYL (SUBLIMAZE) injection 50 mcg (50 mcg Intravenous Given 05/25/20 1400)  ____________________________________________   FINAL CLINICAL IMPRESSION(S) / ED DIAGNOSES  Final diagnoses:  COPD exacerbation ([place])  Acute pancreatitis, unspecified complication status, unspecified pancreatitis type  Sepsis, due to unspecified organism, unspecified whether acute organ dysfunction present ([place])  Hypokalemia  Lactic acidosis     ED Discharge Orders    None       Note:  This document was prepared using Dragon voice recognition software and may include unintentional dictation errors.   Lucrezia Starch, MD 05/25/20 [phone]

## 2020-05-25 NOTE — ED Notes (Signed)
Report given to [facility], attempted to call report to [facility] x2 without success  Pt to [facility] ED Report to [place], [phone]

## 2020-05-25 NOTE — ED Notes (Signed)
Report given to [facility]

## 2020-05-25 NOTE — ED Notes (Signed)
Attempted to call [facility] at [phone] for report with no answer

## 2020-05-25 NOTE — Progress Notes (Signed)
CODE SEPSIS - PHARMACY COMMUNICATION  **Broad Spectrum Antibiotics should be administered within 1 hour of Sepsis diagnosis**  Time Code Sepsis Called/Page Received: 1324  Antibiotics Ordered: vancomycin/cefepime/metronidazole  Time of 1st antibiotic administration: 1400  Additional action taken by pharmacy: n/a  Benita Gutter 05/25/2020  1:26 PM

## 2020-05-25 NOTE — ED Notes (Signed)
emtala reviewed by this RN 

## 2020-05-25 NOTE — Progress Notes (Signed)
Code Sepsis monitoring discontinued due to discharge for tx to [facility].[name]

## 2020-05-25 NOTE — Consult Note (Signed)
PHARMACY -  BRIEF ANTIBIOTIC NOTE   Pharmacy has received consult(s) for cefepime and vancomycin from an ED provider. Patient is also ordered metronidazole. The patient's profile has been reviewed for ht/wt/allergies/indication/available labs.    One time order(s) placed for --Vancomycin 1 g (already ordered) --Cefepime 2 g (already ordered)  Further antibiotics/pharmacy consults should be ordered by admitting physician if indicated.                       Thank you, Benita Gutter 05/25/2020  1:27 PM

## 2020-05-26 ENCOUNTER — Telehealth: Payer: Self-pay | Admitting: *Deleted

## 2020-05-26 NOTE — Telephone Encounter (Signed)
Attempted to contact pt to review upcoming appt scheduled with Dr. Janese Banks to discuss PET scan results. Pt did not answer and unable to leave message. Will mail appts and include contact info if pt needs to reschedule appt.   Per ED visit from yesterday, pt was transported to [facility] for further evaluation regarding poss pancreatitis and sepsis. Will try to follow up again with pt once he is discharged from [place] to review upcoming appts.

## 2020-05-30 LAB — CULTURE, BLOOD (ROUTINE X 2)
Culture: NO GROWTH
Culture: NO GROWTH
Special Requests: ADEQUATE
Special Requests: ADEQUATE

## 2020-06-03 ENCOUNTER — Inpatient Hospital Stay: Payer: Medicare Other | Admitting: Oncology

## 2020-06-12 ENCOUNTER — Encounter: Payer: Self-pay | Admitting: Family Medicine

## 2020-06-12 ENCOUNTER — Other Ambulatory Visit: Payer: Self-pay

## 2020-06-12 ENCOUNTER — Ambulatory Visit (INDEPENDENT_AMBULATORY_CARE_PROVIDER_SITE_OTHER): Payer: Medicare Other | Admitting: Family Medicine

## 2020-06-12 VITALS — BP 93/50 | HR 76 | Temp 97.8°F | Ht 69.0 in | Wt 198.6 lb

## 2020-06-12 DIAGNOSIS — E1142 Type 2 diabetes mellitus with diabetic polyneuropathy: Secondary | ICD-10-CM | POA: Diagnosis not present

## 2020-06-12 DIAGNOSIS — K219 Gastro-esophageal reflux disease without esophagitis: Secondary | ICD-10-CM | POA: Diagnosis not present

## 2020-06-12 DIAGNOSIS — R5381 Other malaise: Secondary | ICD-10-CM

## 2020-06-12 DIAGNOSIS — R251 Tremor, unspecified: Secondary | ICD-10-CM | POA: Diagnosis not present

## 2020-06-12 DIAGNOSIS — G8918 Other acute postprocedural pain: Secondary | ICD-10-CM | POA: Diagnosis not present

## 2020-06-12 DIAGNOSIS — I10 Essential (primary) hypertension: Secondary | ICD-10-CM

## 2020-06-12 DIAGNOSIS — R911 Solitary pulmonary nodule: Secondary | ICD-10-CM | POA: Diagnosis not present

## 2020-06-12 LAB — POCT GLYCOSYLATED HEMOGLOBIN (HGB A1C): Hemoglobin A1C: 7 % — AB (ref 4.0–5.6)

## 2020-06-12 MED ORDER — OXYCODONE HCL 5 MG PO TABS: 5.0000 mg | ORAL_TABLET | ORAL | 0 refills | Status: DC | PRN

## 2020-06-12 MED ORDER — GABAPENTIN 100 MG PO CAPS: 100.0000 mg | ORAL_CAPSULE | Freq: Every day | ORAL | 1 refills | Status: DC

## 2020-06-12 NOTE — Patient Instructions (Addendum)
As we discussed, I have sent in a one time refill on your oxycodone to your pharmacy on file.  I will contact the pulmonary nodule clinic and request their assistance with scheduling for evaluation of the left lower lobe pulmonary nodule that [facility] reported at 1.3cm.  A referral for Home Health has been placed today.  If you have not heard from the specialty office or our referral coordinator within 1 week, please let us know and we will follow up with the referral coordinator for an update.  A referral to Neurology for your tremor has been placed today.  If you have not heard from the specialty office or our referral coordinator within 1 week, please let us know and we will follow up with the referral coordinator for an update.  Continue your medications as prescribed.  You can learn more information online about your diabetes at American Diabetes Association: http://www.diabetes.org/ - General self-care (diet, medications, blood sugar checks). - Diet recommendations - There are even recipes available for you to look at and try.  We will plan to see you back in 3 months for hypertension and diabetes follow up visit  You will receive a survey after today's visit either digitally by e-mail or paper by [place] mail. Your experiences and feedback matter to [name].  Please respond so we know how we are doing as we provide care for you.  Call us with any questions/concerns/needs.  It is my goal to be available to you for your health concerns.  Thanks for choosing me to be a partner in your healthcare needs!  Harlin Rain, FNP-C Family Nurse Practitioner [facility] Phone: [phone]

## 2020-06-12 NOTE — Progress Notes (Signed)
Subjective:    Patient ID: Nathaniel Little, male    DOB: 1946/08/09, 74 y.o.   MRN: [id number]  Nathaniel Little is a 74 y.o. male presenting on 06/12/2020 for Hypertension and Diabetes   HPI  Mr. Hallas presents to clinic for follow up on his diabetes and hypertension.  Reports he has new concerns for constant "shakes", and a request for home health services.  Reports that he has noticed these "shakes" for a few months now.  They are beginning to interfere with his activities of daily living.  Diabetes Pt presents today for follow up Type 2 Diabetes Mellitus.  He/she (caps): He ACTION; IS/IS NOT: is not checking AM CBG at home. -Current diabetic medications include: lantus 20 units at bedtime, metformin 1000mg  BID WC -ACTION; IS/IS NOT: is not currently symptomatic -Actions; denies/reports/admits to: denies polydipsia, polyphagia, polyuria, headaches, diaphoresis, shakiness, chills, pain, numbness or tingling in extremities or changes in vision -Clinical course has been improving  -Reports no structured exercise routine -Diet is high in salt, high in fat, and high in carbohydrates  PREVENTION Eye exam current (within 1 year) Due, encouraged to schedule Foot exam current (within 1 year) Up to date Lipid/ASCVD risk reduction - on statin: YES/NO: Yes  Kidney Protection (On ACE/ARB)? YES/NO: No     Depression screen [place] 2/9 03/31/2020 12/04/2019 08/13/2019  Decreased Interest 0 0 0  Down, Depressed, Hopeless 0 1 1  PHQ - 2 Score 0 1 1  Altered sleeping - - 0  Tired, decreased energy - - 0  Change in appetite - - 0  Feeling bad or failure about yourself  - - 0  Trouble concentrating - - 0  Moving slowly or fidgety/restless - - 0  Suicidal thoughts - - 0  PHQ-9 Score - - 1  Difficult doing work/chores - - Not difficult at all    Social History   Tobacco Use  . Smoking status: Current Some Day Smoker    Packs/day: 0.25    Years: 68.00    Pack years: 17.00    Types: Cigarettes  .  Smokeless tobacco: Former Systems developer    Types: Chew, [place] date: 2000  . Tobacco comment: 5 cigs daily 01/29/20/ER  Vaping Use  . Vaping Use: Never used  Substance Use Topics  . Alcohol use: Not Currently    Alcohol/week: 0.0 - 1.0 standard drinks    Comment: quit 40years ago  . Drug use: No    Review of Systems  Constitutional: Negative.   HENT: Negative.   Eyes: Negative.   Respiratory: Negative.   Cardiovascular: Negative.   Gastrointestinal: Negative.   Endocrine: Negative.   Genitourinary: Negative.   Musculoskeletal: Negative.   Skin: Negative.   Allergic/Immunologic: Negative.   Neurological: Positive for tremors. Negative for dizziness, seizures, syncope, facial asymmetry, speech difficulty, weakness, light-headedness, numbness and headaches.  Hematological: Negative.   Psychiatric/Behavioral: Negative.    Per HPI unless specifically indicated above     Objective:    BP (!) 93/50 (BP Location: Right Arm, Patient Position: Sitting, Cuff Size: Large)   Pulse 76   Temp 97.8 F (36.6 C) (Oral)   Ht 5\' 9"  (1.753 m)   Wt 198 lb 9.6 oz (90.1 kg)   BMI 29.33 kg/m   Wt Readings from Last 3 Encounters:  06/12/20 198 lb 9.6 oz (90.1 kg)  05/25/20 213 lb (96.6 kg)  05/18/20 (S) 213 lb 3 oz (96.7 kg)    Physical Exam Vitals  reviewed.  Constitutional:      General: He is not in acute distress.    Appearance: Normal appearance. He is well-developed, well-groomed and overweight. He is not ill-appearing or toxic-appearing.  HENT:     Head: Normocephalic and atraumatic.     Nose:     Comments: Lizbeth Bark is in place, covering mouth and nose. Eyes:     General:        Right eye: No discharge.        Left eye: No discharge.     Extraocular Movements: Extraocular movements intact.     Conjunctiva/sclera: Conjunctivae normal.     Pupils: Pupils are equal, round, and reactive to light.  Cardiovascular:     Rate and Rhythm: Normal rate and regular rhythm.     Pulses:  Normal pulses.     Heart sounds: Normal heart sounds. No murmur heard.  No friction rub. No gallop.   Pulmonary:     Effort: Pulmonary effort is normal. No respiratory distress.     Breath sounds: Normal breath sounds.  Musculoskeletal:     Right lower leg: No edema.     Left lower leg: No edema.  Skin:    General: Skin is warm and dry.     Capillary Refill: Capillary refill takes less than 2 seconds.  Neurological:     General: No focal deficit present.     Mental Status: He is alert and oriented to person, place, and time.     Motor: Tremor present.     Comments: Ambulating with walker  Psychiatric:        Attention and Perception: Attention and perception normal.        Mood and Affect: Mood and affect normal.        Speech: Speech normal.        Behavior: Behavior normal. Behavior is cooperative.        Thought Content: Thought content normal.        Cognition and Memory: Cognition and memory normal.    Results for orders placed or performed in visit on 06/12/20  POCT glycosylated hemoglobin (Hb A1C)  Result Value Ref Range   Hemoglobin A1C 7.0 (A) 4.0 - 5.6 %   HbA1c POC (<> result, manual entry)     HbA1c, POC (prediabetic range)     HbA1c, POC (controlled diabetic range)        Assessment & Plan:   Problem List Items Addressed This Visit      Cardiovascular and Mediastinum   Essential hypertension    Controlled hypertension.  BP is at goal < 130/80 and slightly low today.  Pt has been working on lifestyle modifications.  Taking medications tolerating well without side effects.  Complications:  K1SW, GERD, HLD, PVD  Plan: 1. Continue taking medications as directed 2. Obtain labs in 3 months  3. Encouraged heart healthy diet and increasing exercise to 30 minutes most days of the week, going no more than 2 days in a row without exercise. 4. Check BP 1-2 x per week at home, keep log, and bring to clinic at next appointment. 5. Follow up 3 months.            Digestive   GERD (gastroesophageal reflux disease)    Currently well controlled on pantoprazole 40mg  twice daily.  Plan: 1. Continue pantoprazole 40mg  twice daily. Side effects discussed. Pt wants to continue med. 2. Avoid diet triggers. Reviewed need to seek care if globus sensation, difficulty swallowing, s/sx  of GI bleed. 3. Follow up as needed and in 6 months.         Endocrine   DM type 2 with diabetic peripheral neuropathy ([place]) - Primary    Well-controlledDM with A1c 7.0% improved from 8.5% on 03/28/2020 and goal A1c < 7.0%. - Complications : Overweight, GERD, HTN, OSA, HLD, Tobacco abuse  Plan:  1. Continue current therapy: Lantus 20 units at bedtime, metformin 1000mg  BID WC 2. Encourage improved lifestyle: - low carb/low glycemic diet reinforced prior education - Increase physical activity to 30 minutes most days of the week.  Explained that increased physical activity increases body's use of sugar for energy. 3. Check fasting am CBG and log these.  Bring log to next visit for review 4. Continue Statin 5. DM Foot exam done today with no acute findings.   and Advised to schedule DM ophtho exam, send record. 6. Follow-up 3 months       Relevant Medications   traZODone (DESYREL) 100 MG tablet   gabapentin (NEURONTIN) 100 MG capsule   Other Relevant Orders   POCT glycosylated hemoglobin (Hb A1C) (Completed)     Other   Incidental pulmonary nodule, greater than or equal to 77mm    Has upcoming appointment in September for evaluation with specialist      Post-operative pain    Recently hospitalized for pancreatitis, is requesting one time refill on his oxycodone to help over the next few days.  Discussed and is appropriate, will refill this one time.      Relevant Medications   oxyCODONE (ROXICODONE) 5 MG immediate release tablet   Tremor    New onset, is constant, has found to be interrupting his ADLs.  Discussed need for evaluation with Neurology and will put in  referral today.  Patient in agreement.  Plan: 1. Referral to Neurology placed      Relevant Orders   Ambulatory referral to Neurology   Physical deconditioning    Physical deconditioning s/p surgery and hospitalizations, would benefit from home health RN and aide to for ADLs.  Plan: 1. Referral to home health placed      Relevant Orders   Ambulatory referral to [place] ordered this encounter  Medications  . gabapentin (NEURONTIN) 100 MG capsule    Sig: Take 1-2 capsules (100-200 mg total) by mouth at bedtime.    Dispense:  180 capsule    Refill:  1  . oxyCODONE (ROXICODONE) 5 MG immediate release tablet    Sig: Take 1 tablet (5 mg total) by mouth every 4 (four) hours as needed for severe pain.    Dispense:  30 tablet    Refill:  0    Follow up plan: Return in about 3 months (around 09/11/2020) for HTN & DM f/u.   Harlin Rain, [place] Family Nurse Practitioner [facility] 06/12/2020, 1:52 PM

## 2020-06-13 DIAGNOSIS — R5381 Other malaise: Secondary | ICD-10-CM | POA: Insufficient documentation

## 2020-06-13 DIAGNOSIS — G8918 Other acute postprocedural pain: Secondary | ICD-10-CM | POA: Insufficient documentation

## 2020-06-13 DIAGNOSIS — R251 Tremor, unspecified: Secondary | ICD-10-CM | POA: Insufficient documentation

## 2020-06-13 DIAGNOSIS — E1165 Type 2 diabetes mellitus with hyperglycemia: Secondary | ICD-10-CM | POA: Insufficient documentation

## 2020-06-13 DIAGNOSIS — E1142 Type 2 diabetes mellitus with diabetic polyneuropathy: Secondary | ICD-10-CM

## 2020-06-13 HISTORY — DX: Type 2 diabetes mellitus with diabetic polyneuropathy: E11.42

## 2020-06-13 NOTE — Assessment & Plan Note (Signed)
Well-controlledDM with A1c 7.0% improved from 8.5% on 03/28/2020 and goal A1c < 7.0%. - Complications : Overweight, GERD, HTN, OSA, HLD, Tobacco abuse  Plan:  1. Continue current therapy: Lantus 20 units at bedtime, metformin 1000mg  BID WC 2. Encourage improved lifestyle: - low carb/low glycemic diet reinforced prior education - Increase physical activity to 30 minutes most days of the week.  Explained that increased physical activity increases body's use of sugar for energy. 3. Check fasting am CBG and log these.  Bring log to next visit for review 4. Continue Statin 5. DM Foot exam done today with no acute findings.   and Advised to schedule DM ophtho exam, send record. 6. Follow-up 3 months

## 2020-06-13 NOTE — Assessment & Plan Note (Signed)
Currently well controlled on pantoprazole 40mg  twice daily.  Plan: 1. Continue pantoprazole 40mg  twice daily. Side effects discussed. Pt wants to continue med. 2. Avoid diet triggers. Reviewed need to seek care if globus sensation, difficulty swallowing, s/sx of GI bleed. 3. Follow up as needed and in 6 months.

## 2020-06-13 NOTE — Assessment & Plan Note (Signed)
Physical deconditioning s/p surgery and hospitalizations, would benefit from home health RN and aide to for ADLs.  Plan: 1. Referral to home health placed

## 2020-06-13 NOTE — Assessment & Plan Note (Signed)
New onset, is constant, has found to be interrupting his ADLs.  Discussed need for evaluation with Neurology and will put in referral today.  Patient in agreement.  Plan: 1. Referral to Neurology placed

## 2020-06-13 NOTE — Assessment & Plan Note (Signed)
Has upcoming appointment in September for evaluation with specialist

## 2020-06-13 NOTE — Assessment & Plan Note (Deleted)
Well-controlledDM with A1c 7.0% improved from 8.5% on 03/28/2020 and goal A1c < 7.0%. - Complications : Overweight, GERD, HTN, OSA, HLD, Tobacco abuse  Plan:  1. Continue current therapy: Lantus 20 units at bedtime, metformin 1000mg  BID WC 2. Encourage improved lifestyle: - low carb/low glycemic diet reinforced prior education - Increase physical activity to 30 minutes most days of the week.  Explained that increased physical activity increases body's use of sugar for energy. 3. Check fasting am CBG and log these.  Bring log to next visit for review 4. Continue Statin 5. DM Foot exam done today with no acute findings.   and Advised to schedule DM ophtho exam, send record. 6. Follow-up 3 months

## 2020-06-13 NOTE — Assessment & Plan Note (Signed)
Controlled hypertension.  BP is at goal < 130/80 and slightly low today.  Pt has been working on lifestyle modifications.  Taking medications tolerating well without side effects.  Complications:  M1OM, GERD, HLD, PVD  Plan: 1. Continue taking medications as directed 2. Obtain labs in 3 months  3. Encouraged heart healthy diet and increasing exercise to 30 minutes most days of the week, going no more than 2 days in a row without exercise. 4. Check BP 1-2 x per week at home, keep log, and bring to clinic at next appointment. 5. Follow up 3 months.

## 2020-06-13 NOTE — Assessment & Plan Note (Signed)
Recently hospitalized for pancreatitis, is requesting one time refill on his oxycodone to help over the next few days.  Discussed and is appropriate, will refill this one time.

## 2020-06-19 ENCOUNTER — Encounter: Payer: Self-pay | Admitting: Oncology

## 2020-06-19 ENCOUNTER — Inpatient Hospital Stay: Payer: Medicare Other | Attending: Oncology | Admitting: Oncology

## 2020-06-19 ENCOUNTER — Other Ambulatory Visit: Payer: Self-pay

## 2020-06-19 VITALS — BP 119/71 | HR 76 | Temp 96.4°F | Resp 18 | Wt 198.0 lb

## 2020-06-19 DIAGNOSIS — Z79899 Other long term (current) drug therapy: Secondary | ICD-10-CM | POA: Diagnosis not present

## 2020-06-19 DIAGNOSIS — F329 Major depressive disorder, single episode, unspecified: Secondary | ICD-10-CM | POA: Diagnosis not present

## 2020-06-19 DIAGNOSIS — Z794 Long term (current) use of insulin: Secondary | ICD-10-CM | POA: Insufficient documentation

## 2020-06-19 DIAGNOSIS — Z8546 Personal history of malignant neoplasm of prostate: Secondary | ICD-10-CM | POA: Insufficient documentation

## 2020-06-19 DIAGNOSIS — I5032 Chronic diastolic (congestive) heart failure: Secondary | ICD-10-CM | POA: Diagnosis not present

## 2020-06-19 DIAGNOSIS — J449 Chronic obstructive pulmonary disease, unspecified: Secondary | ICD-10-CM | POA: Insufficient documentation

## 2020-06-19 DIAGNOSIS — Z7982 Long term (current) use of aspirin: Secondary | ICD-10-CM | POA: Diagnosis not present

## 2020-06-19 DIAGNOSIS — K760 Fatty (change of) liver, not elsewhere classified: Secondary | ICD-10-CM | POA: Diagnosis not present

## 2020-06-19 DIAGNOSIS — R911 Solitary pulmonary nodule: Secondary | ICD-10-CM | POA: Insufficient documentation

## 2020-06-19 DIAGNOSIS — F17210 Nicotine dependence, cigarettes, uncomplicated: Secondary | ICD-10-CM | POA: Insufficient documentation

## 2020-06-19 DIAGNOSIS — I110 Hypertensive heart disease with heart failure: Secondary | ICD-10-CM | POA: Insufficient documentation

## 2020-06-19 DIAGNOSIS — E119 Type 2 diabetes mellitus without complications: Secondary | ICD-10-CM | POA: Diagnosis not present

## 2020-06-19 DIAGNOSIS — G4730 Sleep apnea, unspecified: Secondary | ICD-10-CM | POA: Diagnosis not present

## 2020-06-21 NOTE — Progress Notes (Signed)
Hematology/Oncology Consult note [facility]  [phone] [phone]  Patient Care Team: [name], [name], FNP as PCP - General (Family Medicine) Rockey Situ Kathlene November, MD as PCP - Cardiology (Cardiology) Clent Jacks, RN as Registered Nurse Rockey Situ, Kathlene November, MD as Consulting Physician (Cardiology) Greg Cutter, LCSW as Social Worker (Licensed Clinical Social Worker) Vanita Ingles, RN as Case Manager ([place]) Vella Raring, [facility] as Pharmacist   Name of the patient: Nathaniel Little  [id number]  [birth year]   Date of visit: 06/21/20  Diagnosis- h/o IPMN now referred for lung nodule  Chief complaint/ Reason for visit-discuss further management of lung nodule  Heme/Onc history: Patient is a 74 year old male who was last seen by me in 2018 for cystic mass in the pancreas which was ultimately identified as IPMN for which she underwent Whipple surgery at [facility].  Recently again he was admitted to the hospital for symptoms of pancreatitis following ERCP for choledocholithiasis.  Patient had undergone CT angio chest on 05/25/2020 for symptoms of left upper quadrant abdominal pain which incidentally showed a solid 1.5 cm left lower lobe pulmonary nodule which had increased from 1.4 cm on prior CT in June 2021.  He has also undergone a PET scan in June 2021 which showed mild hypermetabolism in this area concerning for primary bronchogenic neoplasm.  No locoregional adenopathy or distant metastatic disease.  Patient has not undergone biopsy of this nodule yet.  Patient lives alone and is independent of his ADLs but needs a motorized wheelchair.  Interval history-patient reports some ongoing fatigue.  Abdominal pain has resolved.  He has home health in place following recent hospitalization for physical deconditioning.  ECOG PS- 2 Pain scale- 0   Review of systems- Review of Systems  Constitutional: Positive for malaise/fatigue.  Negative for chills, fever and weight loss.  HENT: Negative for congestion, ear discharge and nosebleeds.   Eyes: Negative for blurred vision.  Respiratory: Negative for cough, hemoptysis, sputum production, shortness of breath and wheezing.   Cardiovascular: Negative for chest pain, palpitations, orthopnea and claudication.  Gastrointestinal: Negative for abdominal pain, blood in stool, constipation, diarrhea, heartburn, melena, nausea and vomiting.  Genitourinary: Negative for dysuria, flank pain, frequency, hematuria and urgency.  Musculoskeletal: Negative for back pain, joint pain and myalgias.  Skin: Negative for rash.  Neurological: Negative for dizziness, tingling, focal weakness, seizures, weakness and headaches.  Endo/Heme/Allergies: Does not bruise/bleed easily.  Psychiatric/Behavioral: Negative for depression and suicidal ideas. The patient does not have insomnia.       Allergies  Allergen Reactions  . Codeine Itching     Past Medical History:  Diagnosis Date  . (HFpEF) heart failure with preserved ejection fraction ([place])    a. 08/2017 Echo: EF 60-65%, Gr1 DD.  [name] Arthritis   . COPD (chronic obstructive pulmonary disease) ([place])   . Depression   . Diabetes mellitus without complication ([place])   . Hypertension   . Incidental pulmonary nodule, greater than or equal to 14mm 03/11/2020   Left lower lobe - suspicious for bronchogenic neoplasm  . Lymphedema   . Morbid obesity ([place])   . Prostate cancer ([place]) 03/2013   ? PANCREATIC  . S/P TAVR (transcatheter aortic valve replacement) 04/01/2020   s/p TAVR with a 29 mm Edwards Sapien 3 THV via the TF approach by Drs Buena Irish & Roxy Manns  . Severe aortic stenosis   . Sleep apnea with use of continuous positive airway pressure (CPAP)   .  Tobacco abuse   . Tremor    HEAD     Past Surgical History:  Procedure Laterality Date  . ADENOIDECTOMY    . APPENDECTOMY    . CARDIAC CATHETERIZATION    . CATARACT EXTRACTION W/PHACO Left  11/28/2018   Procedure: CATARACT EXTRACTION PHACO AND INTRAOCULAR LENS PLACEMENT (IOC) LEFT, DIABETIC;  Surgeon: Birder Robson, MD;  Location: [facility] ORS;  Service: Ophthalmology;  Laterality: Left;  [name]  00:56 CDE 9.44 Fluid pack lot # [id number]  . EUS N/A 10/13/2017   Procedure: FULL UPPER ENDOSCOPIC ULTRASOUND (EUS) RADIAL;  Surgeon: Holly Bodily, MD;  Location: [facility] ENDOSCOPY;  Service: Gastroenterology;  Laterality: N/A;  . PANCREATICODUODENECTOMY  07/23/2018  . RIGHT/LEFT HEART CATH AND CORONARY ANGIOGRAPHY N/A 02/21/2020   Procedure: RIGHT/LEFT HEART CATH AND CORONARY ANGIOGRAPHY;  Surgeon: Minna Merritts, MD;  Location: [place] CV LAB;  Service: Cardiovascular;  Laterality: N/A;  . TEE WITHOUT CARDIOVERSION N/A 04/01/2020   Procedure: TRANSESOPHAGEAL ECHOCARDIOGRAM (TEE);  Surgeon: Burnell Blanks, MD;  Location: [place] CV LAB;  Service: Open Heart Surgery;  Laterality: N/A;  . TONSILLECTOMY    . TRANSCATHETER AORTIC VALVE REPLACEMENT, TRANSFEMORAL N/A 04/01/2020   Procedure: TRANSCATHETER AORTIC VALVE REPLACEMENT, TRANSFEMORAL;  Surgeon: Burnell Blanks, MD;  Location: [place] CV LAB;  Service: Open Heart Surgery;  Laterality: N/A;  . wipple      Social History   Socioeconomic History  . Marital status: Widowed    Spouse name: Not on file  . Number of children: 2  . Years of education: Not on file  . Highest education level: Not on file  Occupational History  . Occupation: retired Social research officer, government  Tobacco Use  . Smoking status: Current Some Day Smoker    Packs/day: 0.25    Years: 68.00    Pack years: 17.00    Types: Cigarettes  . Smokeless tobacco: Former Systems developer    Types: Chew, [place] date: 2000  . Tobacco comment: 5 cigs daily 01/29/20/ER  Vaping Use  . Vaping Use: Never used  Substance and Sexual Activity  . Alcohol use: Not Currently    Alcohol/week: 0.0 - 1.0 standard drinks    Comment: quit 40years ago  . Drug use:  No  . Sexual activity: Not Currently  Other Topics Concern  . Not on file  Social History Narrative   Living at [place] Strain: Medium Risk  . Difficulty of Paying Living Expenses: Somewhat hard  Food Insecurity: No Food Insecurity  . Worried About Charity fundraiser in the Last Year: Never true  . Ran Out of Food in the Last Year: Never true  Transportation Needs: No Transportation Needs  . Lack of Transportation (Medical): No  . Lack of Transportation (Non-Medical): No  Physical Activity: Insufficiently Active  . Days of Exercise per Week: 3 days  . Minutes of Exercise per Session: 10 min  Stress: No Stress Concern Present  . Feeling of Stress : Only a little  Social Connections: Moderately Isolated  . Frequency of Communication with Friends and Family: More than three times a week  . Frequency of Social Gatherings with Friends and Family: More than three times a week  . Attends Religious Services: More than 4 times per year  . Active Member of Clubs or Organizations: No  . Attends Archivist Meetings: Never  . Marital Status: Widowed  Human resources officer  Violence: Not At Risk  . Fear of Current or Ex-Partner: No  . Emotionally Abused: No  . Physically Abused: No  . Sexually Abused: No    Family History  Problem Relation Age of Onset  . Breast cancer Mother   . Cancer Father        Black Lung  . Cerebral palsy Brother   . Tuberculosis Paternal Uncle   . Prostate cancer Neg Hx   . Kidney cancer Neg Hx   . Bladder Cancer Neg Hx      Current Outpatient Medications:  .  albuterol (PROVENTIL) (2.5 MG/3ML) 0.083% nebulizer solution, Take 2.5 mg by nebulization every 6 (six) hours as needed for wheezing or shortness of breath., Disp: , Rfl:  .  albuterol (VENTOLIN HFA) 108 (90 Base) MCG/ACT inhaler, Inhale 2 puffs into the lungs every 6 (six) hours as needed for wheezing or shortness of breath., Disp:  6.7 g, Rfl: 2 .  aspirin EC 81 MG tablet, Take 81 mg by mouth daily., Disp: , Rfl:  .  atorvastatin (LIPITOR) 80 MG tablet, Take 80 mg by mouth daily. , Disp: , Rfl:  .  cloNIDine (CATAPRES) 0.1 MG tablet, Take 0.1 mg by mouth 2 (two) times daily., Disp: , Rfl:  .  clopidogrel (PLAVIX) 75 MG tablet, Take 1 tablet (75 mg total) by mouth daily., Disp: 90 tablet, Rfl: 0 .  diclofenac Sodium (VOLTAREN) 1 % GEL, Apply 2 g topically in the morning and at bedtime., Disp: , Rfl:  .  gabapentin (NEURONTIN) 100 MG capsule, Take 1-2 capsules (100-200 mg total) by mouth at bedtime., Disp: 180 capsule, Rfl: 1 .  hydrochlorothiazide (HYDRODIURIL) 25 MG tablet, Take 1 tablet (25 mg total) by mouth daily., Disp: 30 tablet, Rfl: 5 .  insulin glargine (LANTUS) 100 unit/mL SOPN, Inject 20 Units into the skin at bedtime., Disp: , Rfl:  .  metFORMIN (GLUCOPHAGE) 500 MG tablet, Take 1,000 mg by mouth 2 (two) times daily. , Disp: , Rfl:  .  oxyCODONE (ROXICODONE) 5 MG immediate release tablet, Take 1 tablet (5 mg total) by mouth every 4 (four) hours as needed for severe pain., Disp: 30 tablet, Rfl: 0 .  pantoprazole (PROTONIX) 40 MG tablet, Take 1 tablet (40 mg total) by mouth 2 (two) times daily., Disp: 60 tablet, Rfl: 11 .  SPIRIVA HANDIHALER 18 MCG inhalation capsule, INHALE CONTENTS OF 1 CAPSULE ONCE DAILY (Patient taking differently: Place 18 mcg into inhaler and inhale daily. ), Disp: 30 capsule, Rfl: 5 .  SYMBICORT 80-4.5 MCG/ACT inhaler, INHALE 2 PUFFS BY MOUTH TWICE DAILY (Patient taking differently: Inhale 2 puffs into the lungs 2 (two) times daily. ), Disp: 10.2 g, Rfl: 3 .  carvedilol (COREG) 3.125 MG tablet, Take 1 tablet (3.125 mg total) by mouth 2 (two) times daily., Disp: 60 tablet, Rfl: 5 .  traZODone (DESYREL) 100 MG tablet, Take by mouth. (Patient not taking: Reported on 06/19/2020), Disp: , Rfl:   Physical exam:  Vitals:   06/19/20 1009  BP: 119/71  Pulse: 76  Resp: 18  Temp: (!) 96.4 F (35.8 C)   TempSrc: Tympanic  SpO2: 98%  Weight: 198 lb (89.8 kg)   Physical Exam Constitutional:      Comments: Sitting in a motorized wheelchair.  Appears in no acute distress  Cardiovascular:     Rate and Rhythm: Normal rate and regular rhythm.     Heart sounds: Normal heart sounds.  Pulmonary:     Effort: Pulmonary effort is  normal.     Breath sounds: Normal breath sounds.  Abdominal:     General: Bowel sounds are normal. There is no distension.     Palpations: Abdomen is soft.     Tenderness: There is no abdominal tenderness.  Skin:    General: Skin is warm and dry.  Neurological:     Mental Status: He is alert and oriented to person, place, and time.      CMP Latest Ref Rng & Units 05/25/2020  Glucose 70 - 99 mg/dL 226(H)  BUN 8 - 23 mg/dL 20  Creatinine 0.61 - 1.24 mg/dL 1.00  Sodium 135 - 145 mmol/L 134(L)  Potassium 3.5 - 5.1 mmol/L 3.4(L)  Chloride 98 - 111 mmol/L 96(L)  CO2 22 - 32 mmol/L 25  Calcium 8.9 - 10.3 mg/dL 9.2  Total Protein 6.5 - 8.1 g/dL 7.4  Total Bilirubin 0.3 - 1.2 mg/dL 1.5(H)  Alkaline Phos 38 - 126 U/L 233(H)  AST 15 - 41 U/L 33  ALT 0 - 44 U/L 65(H)   CBC Latest Ref Rng & Units 05/25/2020  WBC 4.0 - 10.5 K/uL 22.9(H)  Hemoglobin 13.0 - 17.0 g/dL 12.5(L)  Hematocrit 39 - 52 % 37.1(L)  Platelets 150 - 400 K/uL 266    No images are attached to the encounter.  DG Chest 2 View  Result Date: 05/25/2020 CLINICAL DATA:  Cough EXAM: CHEST - 2 VIEW COMPARISON:  04/01/2020 FINDINGS: Indistinct streaky density at the bases. There is baseline chronic interstitial coarsening with bronchitic findings on June 2021 CT. Borderline cardiomegaly. Transcatheter aortic valve replacement. No visible effusion or pneumothorax. Spondylosis with multi-level bridging. IMPRESSION: Mild atelectasis or infiltrates at the lung bases. Background chronic bronchitic markings. Electronically Signed   By: Monte Fantasia M.D.   On: 05/25/2020 11:18   CT Angio Chest PE W and/or Wo  Contrast  Result Date: 05/25/2020 CLINICAL DATA:  COPD. Left upper quadrant abdominal pain radiating to the back since last night. History of Whipple procedure in 2019. History of appendectomy. EXAM: CT ANGIOGRAPHY CHEST CT ABDOMEN AND PELVIS WITH CONTRAST TECHNIQUE: Multidetector CT imaging of the chest was performed using the standard protocol during bolus administration of intravenous contrast. Multiplanar CT image reconstructions and MIPs were obtained to evaluate the vascular anatomy. Multidetector CT imaging of the abdomen and pelvis was performed using the standard protocol during bolus administration of intravenous contrast. CONTRAST:  185mL OMNIPAQUE IOHEXOL 350 MG/ML SOLN COMPARISON:  03/11/2020 CT angiogram of the chest, abdomen and pelvis. FINDINGS: CTA CHEST FINDINGS Cardiovascular: The study is moderate quality for the evaluation of pulmonary embolism, with some motion degradation. There are no convincing filling defects in the central, lobar, segmental or subsegmental pulmonary artery branches to suggest acute pulmonary embolism. Aortic valve prosthesis in place. Atherosclerotic nonaneurysmal thoracic aorta. Normal caliber pulmonary arteries. Top-normal heart size. No significant pericardial fluid/thickening. Right coronary atherosclerosis. Mediastinum/Nodes: No discrete thyroid nodules. Unremarkable esophagus. No pathologically enlarged axillary, mediastinal or hilar lymph nodes. Lungs/Pleura: No pneumothorax. No pleural effusion. Mild centrilobular emphysema with diffuse bronchial wall thickening. Solid 1.5 cm medial left lower lobe pulmonary nodule (series 6/image 51), slightly increased from 1.4 cm on 03/11/2020 CT. Patchy platelike consolidation and volume loss at the dependent basilar lower lobes bilaterally, favor atelectasis. No additional significant pulmonary nodules. Musculoskeletal: No aggressive appearing focal osseous lesions. Marked thoracic spondylosis. Review of the MIP images  confirms the above findings. CT ABDOMEN and PELVIS FINDINGS Hepatobiliary: Normal liver size. Suggestion of diffuse hepatic steatosis. No liver masses.  Cholecystectomy. Postsurgical changes from choledochojejunostomy. No intrahepatic biliary ductal dilatation. Pancreas: Status post resection of the pancreatic head and neck with pancreaticojejunostomy. There is new prominent jejunal wall thickening with surrounding fat stranding and ill-defined fluid at the pancreatico-jejunal anastomosis with thickening of the perianastomotic pancreatic parenchyma. The previously visualized stent in this location is no longer present. No discrete pancreatic mass. No measurable peripancreatic fluid collections. Spleen: Normal size. No mass. Adrenals/Urinary Tract: Normal adrenals. No hydronephrosis. Numerous subcentimeter hypodense renal cortical lesions scattered in both kidneys are too small to characterize, not appreciably changed and require no follow-up. Normal bladder. Stomach/Bowel: Stable postsurgical changes from distal gastrectomy with gastrojejunostomy. No acute gastric abnormality. No unexpected small bowel dilatation. No additional sites of small bowel wall thickening. Appendectomy. Normal large bowel with no diverticulosis, large bowel wall thickening or pericolonic fat stranding. Vascular/Lymphatic: Atherosclerotic nonaneurysmal abdominal aorta. Patent portal, splenic, hepatic and renal veins. Stable hepatic artery stent. No pathologically enlarged lymph nodes in the abdomen or pelvis. Reproductive: Top-normal size prostate. Fiducial markers in the prostate. Other: No pneumoperitoneum, ascites or focal fluid collection. Musculoskeletal: No aggressive appearing focal osseous lesions. Moderate lumbar spondylosis. Review of the MIP images confirms the above findings. IMPRESSION: 1. No evidence of acute pulmonary embolism. 2. Solid 1.5 cm medial left lower lobe pulmonary nodule, slightly increased in size since 03/11/2020  CT, suspicious for primary bronchogenic carcinoma as indicated on 03/17/2020 PET-CT. Multidisciplinary thoracic oncology consultation suggested. 3. Status post Whipple procedure. Prominent jejunal wall thickening with surrounding fat stranding and ill-defined fluid at the pancreatico-jejunal anastomosis with thickening of the perianastomotic pancreatic parenchyma, new since recent 05/18/2020 CT. Previously visualized stent in this location is absent on today's scan. Findings likely represent acute pancreatitis at the pancreaticojejunostomy. No fluid collections. 4. Chronic findings include: Coronary atherosclerosis. Suggestion of diffuse hepatic steatosis. Aortic Atherosclerosis (ICD10-I70.0) and Emphysema (ICD10-J43.9). Electronically Signed   By: Ilona Sorrel M.D.   On: 05/25/2020 14:30   CT ABDOMEN PELVIS W CONTRAST  Result Date: 05/25/2020 CLINICAL DATA:  COPD. Left upper quadrant abdominal pain radiating to the back since last night. History of Whipple procedure in 2019. History of appendectomy. EXAM: CT ANGIOGRAPHY CHEST CT ABDOMEN AND PELVIS WITH CONTRAST TECHNIQUE: Multidetector CT imaging of the chest was performed using the standard protocol during bolus administration of intravenous contrast. Multiplanar CT image reconstructions and MIPs were obtained to evaluate the vascular anatomy. Multidetector CT imaging of the abdomen and pelvis was performed using the standard protocol during bolus administration of intravenous contrast. CONTRAST:  113mL OMNIPAQUE IOHEXOL 350 MG/ML SOLN COMPARISON:  03/11/2020 CT angiogram of the chest, abdomen and pelvis. FINDINGS: CTA CHEST FINDINGS Cardiovascular: The study is moderate quality for the evaluation of pulmonary embolism, with some motion degradation. There are no convincing filling defects in the central, lobar, segmental or subsegmental pulmonary artery branches to suggest acute pulmonary embolism. Aortic valve prosthesis in place. Atherosclerotic  nonaneurysmal thoracic aorta. Normal caliber pulmonary arteries. Top-normal heart size. No significant pericardial fluid/thickening. Right coronary atherosclerosis. Mediastinum/Nodes: No discrete thyroid nodules. Unremarkable esophagus. No pathologically enlarged axillary, mediastinal or hilar lymph nodes. Lungs/Pleura: No pneumothorax. No pleural effusion. Mild centrilobular emphysema with diffuse bronchial wall thickening. Solid 1.5 cm medial left lower lobe pulmonary nodule (series 6/image 51), slightly increased from 1.4 cm on 03/11/2020 CT. Patchy platelike consolidation and volume loss at the dependent basilar lower lobes bilaterally, favor atelectasis. No additional significant pulmonary nodules. Musculoskeletal: No aggressive appearing focal osseous lesions. Marked thoracic spondylosis. Review of the MIP images confirms the  above findings. CT ABDOMEN and PELVIS FINDINGS Hepatobiliary: Normal liver size. Suggestion of diffuse hepatic steatosis. No liver masses. Cholecystectomy. Postsurgical changes from choledochojejunostomy. No intrahepatic biliary ductal dilatation. Pancreas: Status post resection of the pancreatic head and neck with pancreaticojejunostomy. There is new prominent jejunal wall thickening with surrounding fat stranding and ill-defined fluid at the pancreatico-jejunal anastomosis with thickening of the perianastomotic pancreatic parenchyma. The previously visualized stent in this location is no longer present. No discrete pancreatic mass. No measurable peripancreatic fluid collections. Spleen: Normal size. No mass. Adrenals/Urinary Tract: Normal adrenals. No hydronephrosis. Numerous subcentimeter hypodense renal cortical lesions scattered in both kidneys are too small to characterize, not appreciably changed and require no follow-up. Normal bladder. Stomach/Bowel: Stable postsurgical changes from distal gastrectomy with gastrojejunostomy. No acute gastric abnormality. No unexpected small bowel  dilatation. No additional sites of small bowel wall thickening. Appendectomy. Normal large bowel with no diverticulosis, large bowel wall thickening or pericolonic fat stranding. Vascular/Lymphatic: Atherosclerotic nonaneurysmal abdominal aorta. Patent portal, splenic, hepatic and renal veins. Stable hepatic artery stent. No pathologically enlarged lymph nodes in the abdomen or pelvis. Reproductive: Top-normal size prostate. Fiducial markers in the prostate. Other: No pneumoperitoneum, ascites or focal fluid collection. Musculoskeletal: No aggressive appearing focal osseous lesions. Moderate lumbar spondylosis. Review of the MIP images confirms the above findings. IMPRESSION: 1. No evidence of acute pulmonary embolism. 2. Solid 1.5 cm medial left lower lobe pulmonary nodule, slightly increased in size since 03/11/2020 CT, suspicious for primary bronchogenic carcinoma as indicated on 03/17/2020 PET-CT. Multidisciplinary thoracic oncology consultation suggested. 3. Status post Whipple procedure. Prominent jejunal wall thickening with surrounding fat stranding and ill-defined fluid at the pancreatico-jejunal anastomosis with thickening of the perianastomotic pancreatic parenchyma, new since recent 05/18/2020 CT. Previously visualized stent in this location is absent on today's scan. Findings likely represent acute pancreatitis at the pancreaticojejunostomy. No fluid collections. 4. Chronic findings include: Coronary atherosclerosis. Suggestion of diffuse hepatic steatosis. Aortic Atherosclerosis (ICD10-I70.0) and Emphysema (ICD10-J43.9). Electronically Signed   By: Ilona Sorrel M.D.   On: 05/25/2020 14:30     Assessment and plan- Patient is a 74 y.o. male with prior history of IPMN now found to have left lower lobe lung nodule 1.5 cm mildly hypermetabolic and referred for further management of the same  I have reviewed PET/CT scan images independently and discussed findings with the patient.  The location of the  left lower lobe pulmonary nodule is posterior laterally and it is unclear if this can be reached with a CT-guided biopsy or will need a endoscopy guided approach.  Other alternatives would be a repeat scan in 3 months versus empiric radiation therapy.  I will discuss these options at tumor board and get back to the patient Following tumor board consensus   Visit Diagnosis 1. Lung nodule      Dr. Randa Evens, MD, MPH [facility] [phone] 06/21/2020 4:07 PM

## 2020-06-26 ENCOUNTER — Other Ambulatory Visit: Payer: Medicare Other

## 2020-06-26 ENCOUNTER — Other Ambulatory Visit: Payer: Self-pay | Admitting: *Deleted

## 2020-06-26 ENCOUNTER — Telehealth: Payer: Self-pay | Admitting: *Deleted

## 2020-06-26 ENCOUNTER — Telehealth: Payer: Self-pay | Admitting: Pulmonary Disease

## 2020-06-26 DIAGNOSIS — R918 Other nonspecific abnormal finding of lung field: Secondary | ICD-10-CM

## 2020-06-26 DIAGNOSIS — R911 Solitary pulmonary nodule: Secondary | ICD-10-CM

## 2020-06-26 NOTE — Telephone Encounter (Signed)
Lm for patient.  

## 2020-06-26 NOTE — Telephone Encounter (Signed)
Called pt and left message on cell. Dr. Janese Banks took his scan to tumor conference and that is a group of medical doctors that review the films and as a group come to a decision of how we mover forward for the pt's care. The last scan has the lung nodule slightly increase and it was very minimal increase and it was felt that the best thing to do is a scan without contrast in 3 months and see what the scan shows. I did say that he can call me back and if not I will check with him tom. To try to follow up.

## 2020-06-26 NOTE — Telephone Encounter (Signed)
Patient is returning phone call. Patient phone number is [phone].

## 2020-06-26 NOTE — Telephone Encounter (Signed)
Left message for patient

## 2020-06-27 ENCOUNTER — Telehealth: Payer: Self-pay | Admitting: *Deleted

## 2020-06-27 ENCOUNTER — Telehealth: Payer: Self-pay | Admitting: Oncology

## 2020-06-27 NOTE — Telephone Encounter (Signed)
Lm x2 for patient.

## 2020-06-27 NOTE — Telephone Encounter (Signed)
Writer was asked to schedule CT and follow up for patient in 3 months. Appts scheduled. Writer phoned patient and left voicemail informing of appts. Appt reminders also sent on this date.

## 2020-06-27 NOTE — Progress Notes (Signed)
Tumor Board Documentation  Nathaniel Little was presented by Dr Janese Banks at our Tumor Board on 06/26/2020, which included representatives from medical oncology, radiation oncology, surgical oncology, internal medicine, navigation, pathology, radiology, surgical, pharmacy, research, palliative care.  Nathaniel Little currently presents as a new patient, for discussion with history of the following treatments: active survellience.  Additionally, we reviewed previous medical and familial history, history of present illness, and recent lab results along with all available histopathologic and imaging studies. The tumor board considered available treatment options and made the following recommendations: Active surveillance (Repeat CT in 3 months)    The following procedures/referrals were also placed: No orders of the defined types were placed in this encounter.   Clinical Trial Status: not discussed   Staging used: Not Applicable  National site-specific guidelines   were discussed with respect to the case.  Tumor board is a meeting of clinicians from various specialty areas who evaluate and discuss patients for whom a multidisciplinary approach is being considered. Final determinations in the plan of care are those of the provider(s). The responsibility for follow up of recommendations given during tumor board is that of the provider.   Today's extended care, comprehensive team conference, Nathaniel Little was not present for the discussion and was not examined.   Multidisciplinary Tumor Board is a multidisciplinary case peer review process.  Decisions discussed in the Multidisciplinary Tumor Board reflect the opinions of the specialists present at the conference without having examined the patient.  Ultimately, treatment and diagnostic decisions rest with the primary provider(s) and the patient.

## 2020-06-27 NOTE — Telephone Encounter (Signed)
Called pt today and no answer. Left message on voicemail. Explained that dr Janese Banks reviewed his scan with tumor conference and it is multidiscplinary group of doctors that review films and compare them to the prior scans and make decision of what to do for the future. It was rec: that the nodule had barely increased then to do a repeat scan of chest only in 3 months. Wanted to make sure that is ok. Asked him to call back

## 2020-06-30 ENCOUNTER — Telehealth: Payer: Self-pay | Admitting: General Practice

## 2020-06-30 NOTE — Telephone Encounter (Signed)
Lm x3 for patient.  Will close encounter per office protocol.

## 2020-06-30 NOTE — Telephone Encounter (Signed)
  Chronic Care Management   Outreach Note  06/30/2020 Name: Nathaniel Little MRN: [id number] DOB: Nov 09, 1945  Referred by: Verl Bangs, FNP Reason for referral : Chronic Care Management (RNCM: Incoming call from the patient, did not leave a message. RNCM attempted x 2 to reach out to the patient.)   An unsuccessful telephone outreach was attempted today. The patient was referred to the case management team for assistance with care management and care coordination. Received an incoming call from the patient while on another call. Attempt to call the patient back x 2.  Left VM. The patient has not called back. Will reach back out on scheduled appointment.   Follow Up Plan: The care management team will reach out to the patient again over the next 30 days.   Noreene Larsson RN, MSN, [place] Mobile: [phone]

## 2020-07-07 ENCOUNTER — Ambulatory Visit: Payer: Self-pay | Admitting: *Deleted

## 2020-07-07 ENCOUNTER — Telehealth: Payer: Self-pay | Admitting: General Practice

## 2020-07-07 ENCOUNTER — Ambulatory Visit (INDEPENDENT_AMBULATORY_CARE_PROVIDER_SITE_OTHER): Payer: Medicare Other | Admitting: General Practice

## 2020-07-07 DIAGNOSIS — I5032 Chronic diastolic (congestive) heart failure: Secondary | ICD-10-CM

## 2020-07-07 DIAGNOSIS — I10 Essential (primary) hypertension: Secondary | ICD-10-CM | POA: Diagnosis not present

## 2020-07-07 DIAGNOSIS — E1142 Type 2 diabetes mellitus with diabetic polyneuropathy: Secondary | ICD-10-CM

## 2020-07-07 DIAGNOSIS — F324 Major depressive disorder, single episode, in partial remission: Secondary | ICD-10-CM | POA: Diagnosis not present

## 2020-07-07 DIAGNOSIS — I700 Atherosclerosis of aorta: Secondary | ICD-10-CM

## 2020-07-07 DIAGNOSIS — E782 Mixed hyperlipidemia: Secondary | ICD-10-CM | POA: Diagnosis not present

## 2020-07-07 DIAGNOSIS — J449 Chronic obstructive pulmonary disease, unspecified: Secondary | ICD-10-CM

## 2020-07-07 NOTE — Chronic Care Management (AMB) (Signed)
Chronic Care Management   Follow Up Note   07/07/2020 Name: Nathaniel Little MRN: [id number] DOB: 1946/07/12  Referred by: Verl Bangs, FNP Reason for referral : Chronic Care Management (RNCM Chronic Disease Management and Care Coordination Needs)   Nathaniel Little is a 74 y.o. year old male who is a primary care patient of Lorine Bears, [name], FNP. The CCM team was consulted for assistance with chronic disease management and care coordination needs.    Review of patient status, including review of consultants reports, relevant laboratory and other test results, and collaboration with appropriate care team members and the patient's provider was performed as part of comprehensive patient evaluation and provision of chronic care management services.    SDOH (Social Determinants of Health) assessments performed: Yes See Care Plan activities for detailed interventions related to [facility])     Outpatient Encounter Medications as of 07/07/2020  Medication Sig Note  . albuterol (PROVENTIL) (2.5 MG/3ML) 0.083% nebulizer solution Take 2.5 mg by nebulization every 6 (six) hours as needed for wheezing or shortness of breath.   [name] albuterol (VENTOLIN HFA) 108 (90 Base) MCG/ACT inhaler Inhale 2 puffs into the lungs every 6 (six) hours as needed for wheezing or shortness of breath.   [name] aspirin EC 81 MG tablet Take 81 mg by mouth daily.   [name] atorvastatin (LIPITOR) 80 MG tablet Take 80 mg by mouth daily.    . carvedilol (COREG) 3.125 MG tablet Take 1 tablet (3.125 mg total) by mouth 2 (two) times daily.   . cloNIDine (CATAPRES) 0.1 MG tablet Take 0.1 mg by mouth 2 (two) times daily.   . clopidogrel (PLAVIX) 75 MG tablet Take 1 tablet (75 mg total) by mouth daily.   . diclofenac Sodium (VOLTAREN) 1 % GEL Apply 2 g topically in the morning and at bedtime.   . gabapentin (NEURONTIN) 100 MG capsule Take 1-2 capsules (100-200 mg total) by mouth at bedtime.   . hydrochlorothiazide (HYDRODIURIL) 25 MG tablet Take 1 tablet  (25 mg total) by mouth daily.   . insulin glargine (LANTUS) 100 unit/mL SOPN Inject 20 Units into the skin at bedtime. 03/28/2020: Pt will be using 10 units prior to procedure.  . metFORMIN (GLUCOPHAGE) 500 MG tablet Take 1,000 mg by mouth 2 (two) times daily.    [name] oxyCODONE (ROXICODONE) 5 MG immediate release tablet Take 1 tablet (5 mg total) by mouth every 4 (four) hours as needed for severe pain.   . pantoprazole (PROTONIX) 40 MG tablet Take 1 tablet (40 mg total) by mouth 2 (two) times daily.   [name] SPIRIVA HANDIHALER 18 MCG inhalation capsule INHALE CONTENTS OF 1 CAPSULE ONCE DAILY (Patient taking differently: Place 18 mcg into inhaler and inhale daily. )   . SYMBICORT 80-4.5 MCG/ACT inhaler INHALE 2 PUFFS BY MOUTH TWICE DAILY (Patient taking differently: Inhale 2 puffs into the lungs 2 (two) times daily. )   . traZODone (DESYREL) 100 MG tablet Take by mouth. (Patient not taking: Reported on 06/19/2020)    No facility-administered encounter medications on file as of 07/07/2020.     Objective:   Goals Addressed              This Visit's Progress   .  RNCM: Pt- "My blood sugar this am was 187" (pt-stated)        CARE PLAN ENTRY (see longtitudinal plan of care for additional care plan information)  Objective:  Lab Results  Component Value Date   HGBA1C 8.5 (H) 03/28/2020 .  Lab Results  Component Value Date   CREATININE 0.82 03/28/2020   CREATININE 0.92 02/21/2020   CREATININE 0.84 02/20/2020 .   [name] No results found for: EGFR  Current Barriers:  [name] Knowledge Deficits related to basic Diabetes pathophysiology and self care/management . Knowledge Deficits related to medications used for management of diabetes . Film/video editor . Limited Social Support  Case Manager Clinical Goal(s):  Over the next 120 days, patient will demonstrate improved adherence to prescribed treatment plan for diabetes self care/management as evidenced by:  . daily monitoring and recording of CBG    . adherence to ADA/ carb modified diet . exercise 3/4 days/week for at least 15 minutes of moderate activity . adherence to prescribed medication regimen  Interventions:  . Provided education to patient about basic DM disease process . Reviewed medications with patient and discussed importance of medication adherence . Discussed plans with patient for ongoing care management follow up and provided patient with direct contact information for care management team . Provided patient with written educational materials related to hypo and hyperglycemia and importance of correct treatment.  07-07-2020: The patient had called this am and left a VM. His blood sugar was 93 and he was a little concerned that was too low. Education provided that this is actually normal but if he was not use to it being this low then he may feel a little shaky or diaphoretic. The patient states he ate some candy and he feels fine now. Reviewed with the patient sx/sx of hypoglycemia and to have sugary snacks in the event that his blood sugar drops.  . Advised patient, providing education and rationale, to check cbg daily and record, calling pcp for findings outside established parameters.  07-07-2020: The patient states that his blood sugars this am was 93.  The patient states that it goes up and down. The patient says it is dependent on what he eats.  . Evaluation of the patients dietary habits. The patient states that he is sometimes only eating once a day, sometimes he skips a whole day. Education on eating smaller portion sizes and eating 3 meals a day with snacks. The patient states sometimes he has things that are not good for him. Discussed sugar free candy options and eating healthy. The patient states he is not always compliant with restrictions. 07-07-2020: Review of dietary habits and education on the patient eating regular, smaller meals and snacks to prevent hypoglycemic episodes. The patient verbalized understanding.   . Review of patient status, including review of consultants reports, relevant laboratory and other test results, and medications completed.  Patient Self Care Activities:  . UNABLE to independently manage DM as evidence of most recent hemoglobin A1C of 8.5 on 03-28-2020 . Adheres to prescribed ADA/carb modified  Please see past updates related to this goal by clicking on the "Past Updates" button in the selected goal      .  RNCM: Pt-"I have a lot of health problems" (pt-stated)        CARE PLAN ENTRY (see longtitudinal plan of care for additional care plan information)  Current Barriers:  . Chronic Disease Management support, education, and care coordination needs related to CHF, HTN, HLD, COPD, Depression, and aortic stenosis  Clinical Goal(s) related to CHF, HTN, HLD, COPD, Depression, and aortic stenosis  Over the next 120 days, patient will:  . Work with the care management team to address educational, disease management, and care coordination needs  . Begin or continue self health monitoring  activities as directed today Measure and record blood pressure 3/4 times per week, Measure and record weight daily, and adhere to a heart healthy/ADA diet . Call provider office for new or worsened signs and symptoms Blood pressure findings outside established parameters, Weight outside established parameters, Oxygen saturation lower than established parameter, Chest pain, Shortness of breath, and New or worsened symptom related to COPD/Aortic stenosis/HLD/Depression and other chronic conditions . Call care management team with questions or concerns . Verbalize basic understanding of patient centered plan of care established today  Interventions related to CHF, HTN, HLD, COPD, Depression, and aortic stenosis :  . Evaluation of current treatment plans and patient's adherence to plan as established by provider.  The patient will be admitted on 04-01-2020 to have stents placed. Per the patient he has  blockages in the main artery of his heart. The patient verbalized he would likely be in the hospital for about 4 days. Echo in May showed HFpEF of 60-65%, the patient does have aortic stenosis. The patient knows he is at high risk of heart attack and stroke. 07-07-2020: The patient denies any issues with his chronic conditions. Feels he is doing well with managing his care.  The patient states that he will have a house calls visit tomorrow from [facility].  . Assessed patient understanding of disease states.  The patient verbalized understanding of his chronic conditions. The patient wants to be active and take care of himself. The patient denies any acute distress.  . Assessed patient's education and care coordination needs.  The patient verbalized after he has his procedure he wants to do his part to maintain his health and well being. The patient knows he needs to watch his dietary restrictions and wants to work on smoking cessation.  . Provided disease specific education to patient.  Education on pacing activity and monitoring blood pressure. Blood pressure was elevated several days ago at 132/101, but the patient was outside and feels he was over exerting himself. Other recent readings were 135/70.  The patient states he will follow the recommendations of the healthcare team. The patient states he is ready for his procedure. 07-07-2020: The patient states he is doing well.  His blood pressure is doing well also.  Denies any issues related to his blood pressure or chronic conditions.  . Evaluation of the patients recent tele-health visit for blood in his stool.  The patient states he has found out what was causing it. The patient states that it is sometimes the way he is eating. The patient states that if he has to strain to have a bowel movement that is when he has the blood. Discussed eating fiber and fruits in his diet. Also discussed talking to pcp about this at next appointment if this pattern continues.   Nash Dimmer with appropriate clinical care team members regarding patient needs.  The patient is currently working with the LCSW and pharmacist.  . Evaluation of upcoming provider visits. The patient sees the pcp again on 09-11-2020 at 11:20 am.  Patient Self Care Activities related to CHF, HTN, HLD, COPD, Depression, and aortic stenosis :  . Patient is unable to independently self-manage chronic health conditions  Initial goal documentation and Please see past updates related to this goal by clicking on the "Past Updates" button in the selected goal          Plan:   Telephone follow up appointment with care management team member scheduled for: 09-15-2020 at 3:45 pm   Noreene Larsson  RN, MSN, [place] Mobile: [phone]

## 2020-07-07 NOTE — Telephone Encounter (Signed)
  Patient is calling to report his glucose level was lower than normal this morning at 94. It is  137 now and patient just ate at 55. Patient states he did not have symptoms- but he had never seen it that low. Patient advised to continue to monitor- advised per protocol- call office if fasting <80 in am and at bedtime < 100. Patient reassured and note sent for review and further advise from PCP. Reason for Disposition . Low blood sugar prevention, questions about  Answer Assessment - Initial Assessment Questions 1. SYMPTOMS: "What symptoms are you concerned about?"     Patient is concerned that his glucose reading- 94 2. ONSET:  "When did the symptoms start?"     6:30 3. BLOOD GLUCOSE: "What is your blood glucose level?"      94- fasting 4. USUAL RANGE: "What is your blood glucose level usually?" (e.g., usual fasting morning value, usual evening value)     120-125 5. TYPE 1 or 2:  "Do you know what type of diabetes you have?"  (e.g., Type 1, Type 2, Gestational; doesn't know)      Type 2 6. INSULIN: "Do you take insulin?" "What type of insulin(s) do you use? What is the mode of delivery? (syringe, pen; injection or pump) "When did you last give yourself an insulin dose?" (i.e., time or hours/minutes ago) "How much did you give?" (i.e., how many units)     Insulin- 20 unit at night 7. DIABETES PILLS: "Do you take any pills for your diabetes?"     Yes- metformen 8. OTHER SYMPTOMS: "Do you have any symptoms?" (e.g., fever, frequent urination, difficulty breathing, vomiting)     no 9. LOW BLOOD GLUCOSE TREATMENT: "What have you done so far to treat the low blood glucose level?"     Eating lunch now 10. FOOD: "When did you last eat or drink?"       12:00 11. ALONE: "Are you alone right now or is someone with you?"        Lives alone in apartment 12. PREGNANCY: "Is there any chance you are pregnant?" "When was your last menstrual period?"       n/a  Protocols used: DIABETES - LOW BLOOD  SUGAR-A-AH

## 2020-07-07 NOTE — Patient Instructions (Signed)
Visit Information  Goals Addressed              This Visit's Progress     RNCM: Pt- "My blood sugar this am was 187" (pt-stated)        CARE PLAN ENTRY (see longtitudinal plan of care for additional care plan information)  Objective:  Lab Results  Component Value Date   HGBA1C 8.5 (H) 03/28/2020    Lab Results  Component Value Date   CREATININE 0.82 03/28/2020   CREATININE 0.92 02/21/2020   CREATININE 0.84 02/20/2020     No results found for: EGFR  Current Barriers:   Knowledge Deficits related to basic Diabetes pathophysiology and self care/management  Knowledge Deficits related to medications used for management of diabetes  Film/video editor  Limited Social Support  Case Manager Clinical Goal(s):  Over the next 120 days, patient will demonstrate improved adherence to prescribed treatment plan for diabetes self care/management as evidenced by:   daily monitoring and recording of CBG   adherence to ADA/ carb modified diet  exercise 3/4 days/week for at least 15 minutes of moderate activity  adherence to prescribed medication regimen  Interventions:   Provided education to patient about basic DM disease process  Reviewed medications with patient and discussed importance of medication adherence  Discussed plans with patient for ongoing care management follow up and provided patient with direct contact information for care management team  Provided patient with written educational materials related to hypo and hyperglycemia and importance of correct treatment.  07-07-2020: The patient had called this am and left a VM. His blood sugar was 93 and he was a little concerned that was too low. Education provided that this is actually normal but if he was not use to it being this low then he may feel a little shaky or diaphoretic. The patient states he ate some candy and he feels fine now. Reviewed with the patient sx/sx of hypoglycemia and to have sugary snacks  in the event that his blood sugar drops.   Advised patient, providing education and rationale, to check cbg daily and record, calling pcp for findings outside established parameters.  07-07-2020: The patient states that his blood sugars this am was 93.  The patient states that it goes up and down. The patient says it is dependent on what he eats.   Evaluation of the patients dietary habits. The patient states that he is sometimes only eating once a day, sometimes he skips a whole day. Education on eating smaller portion sizes and eating 3 meals a day with snacks. The patient states sometimes he has things that are not good for him. Discussed sugar free candy options and eating healthy. The patient states he is not always compliant with restrictions. 07-07-2020: Review of dietary habits and education on the patient eating regular, smaller meals and snacks to prevent hypoglycemic episodes. The patient verbalized understanding.   Review of patient status, including review of consultants reports, relevant laboratory and other test results, and medications completed.  Patient Self Care Activities:   UNABLE to independently manage DM as evidence of most recent hemoglobin A1C of 8.5 on 03-28-2020  Adheres to prescribed ADA/carb modified  Please see past updates related to this goal by clicking on the "Past Updates" button in the selected goal        RNCM: Pt-"I have a lot of health problems" (pt-stated)        [place] (see longtitudinal plan of care for additional care plan  information)  Current Barriers:   Chronic Disease Management support, education, and care coordination needs related to CHF, HTN, HLD, COPD, Depression, and aortic stenosis  Clinical Goal(s) related to CHF, HTN, HLD, COPD, Depression, and aortic stenosis  Over the next 120 days, patient will:   Work with the care management team to address educational, disease management, and care coordination needs   Begin or continue  self health monitoring activities as directed today Measure and record blood pressure 3/4 times per week, Measure and record weight daily, and adhere to a heart healthy/ADA diet  Call provider office for new or worsened signs and symptoms Blood pressure findings outside established parameters, Weight outside established parameters, Oxygen saturation lower than established parameter, Chest pain, Shortness of breath, and New or worsened symptom related to COPD/Aortic stenosis/HLD/Depression and other chronic conditions  Call care management team with questions or concerns  Verbalize basic understanding of patient centered plan of care established today  Interventions related to CHF, HTN, HLD, COPD, Depression, and aortic stenosis :   Evaluation of current treatment plans and patient's adherence to plan as established by provider.  The patient will be admitted on 04-01-2020 to have stents placed. Per the patient he has blockages in the main artery of his heart. The patient verbalized he would likely be in the hospital for about 4 days. Echo in May showed HFpEF of 60-65%, the patient does have aortic stenosis. The patient knows he is at high risk of heart attack and stroke. 07-07-2020: The patient denies any issues with his chronic conditions. Feels he is doing well with managing his care.  The patient states that he will have a house calls visit tomorrow from [facility].   Assessed patient understanding of disease states.  The patient verbalized understanding of his chronic conditions. The patient wants to be active and take care of himself. The patient denies any acute distress.   Assessed patient's education and care coordination needs.  The patient verbalized after he has his procedure he wants to do his part to maintain his health and well being. The patient knows he needs to watch his dietary restrictions and wants to work on smoking cessation.   Provided disease specific education to patient.  Education on  pacing activity and monitoring blood pressure. Blood pressure was elevated several days ago at 132/101, but the patient was outside and feels he was over exerting himself. Other recent readings were 135/70.  The patient states he will follow the recommendations of the healthcare team. The patient states he is ready for his procedure. 07-07-2020: The patient states he is doing well.  His blood pressure is doing well also.  Denies any issues related to his blood pressure or chronic conditions.   Evaluation of the patients recent tele-health visit for blood in his stool.  The patient states he has found out what was causing it. The patient states that it is sometimes the way he is eating. The patient states that if he has to strain to have a bowel movement that is when he has the blood. Discussed eating fiber and fruits in his diet. Also discussed talking to pcp about this at next appointment if this pattern continues.   Collaborated with appropriate clinical care team members regarding patient needs.  The patient is currently working with the LCSW and pharmacist.   Evaluation of upcoming provider visits. The patient sees the pcp again on 09-11-2020 at 11:20 am.  Patient Self Care Activities related to CHF, HTN, HLD, COPD,  Depression, and aortic stenosis :   Patient is unable to independently self-manage chronic health conditions  Initial goal documentation and Please see past updates related to this goal by clicking on the "Past Updates" button in the selected goal         Patient verbalizes understanding of instructions provided today.   Telephone follow up appointment with care management team member scheduled for: 09-15-2020 at 3:45 pm  Noreene Larsson RN, MSN, [place] Mobile: [phone]

## 2020-07-08 ENCOUNTER — Ambulatory Visit: Payer: Self-pay

## 2020-07-08 ENCOUNTER — Other Ambulatory Visit: Payer: Self-pay | Admitting: Physician Assistant

## 2020-07-08 DIAGNOSIS — I700 Atherosclerosis of aorta: Secondary | ICD-10-CM

## 2020-07-08 DIAGNOSIS — I739 Peripheral vascular disease, unspecified: Secondary | ICD-10-CM

## 2020-07-08 NOTE — Chronic Care Management (AMB) (Signed)
  Care Management   Follow Up Note   07/08/2020 Name: Bartolo Montanye MRN: [id number] DOB: 01/18/1946  Referred by: Verl Bangs, FNP Reason for referral : [place] is a 75 y.o. year old male who is a primary care patient of Lorine Bears, [name], FNP. The care management team was consulted for assistance with care management and care coordination needs.    Review of patient status, including review of consultants reports, relevant laboratory and other test results, and collaboration with appropriate care team members and the patient's provider was performed as part of comprehensive patient evaluation and provision of chronic care management services.    LCSW completed CCM outreach attempt today but was unable to reach patient successfully. A HIPPA compliant voice message was left encouraging patient to return call once available. LCSW will reschedule CCM SW appointment for patient as well.  A HIPAA compliant phone message was left for the patient providing contact information and requesting a return call.   Eula Fried, BSW, MSW, [email or web address] Phone: [phone]

## 2020-07-30 ENCOUNTER — Telehealth: Payer: Self-pay | Admitting: Pulmonary Disease

## 2020-07-30 NOTE — Telephone Encounter (Signed)
Called and spoke to patient.  Patient stated that he sees [facility]. It was recommended by oncology that he needs lung bx.  appt scheduled for 08/13/2020 with Dr. Patsey Berthold. Nothing further needed.

## 2020-08-13 ENCOUNTER — Encounter: Payer: Self-pay | Admitting: Pulmonary Disease

## 2020-08-13 ENCOUNTER — Ambulatory Visit (INDEPENDENT_AMBULATORY_CARE_PROVIDER_SITE_OTHER): Payer: Medicare Other | Admitting: Pulmonary Disease

## 2020-08-13 ENCOUNTER — Other Ambulatory Visit: Payer: Self-pay

## 2020-08-13 VITALS — BP 108/62 | HR 74 | Temp 98.4°F | Ht 69.0 in | Wt 205.4 lb

## 2020-08-13 DIAGNOSIS — F17210 Nicotine dependence, cigarettes, uncomplicated: Secondary | ICD-10-CM | POA: Diagnosis not present

## 2020-08-13 DIAGNOSIS — J301 Allergic rhinitis due to pollen: Secondary | ICD-10-CM

## 2020-08-13 DIAGNOSIS — I350 Nonrheumatic aortic (valve) stenosis: Secondary | ICD-10-CM

## 2020-08-13 DIAGNOSIS — R0602 Shortness of breath: Secondary | ICD-10-CM

## 2020-08-13 DIAGNOSIS — R911 Solitary pulmonary nodule: Secondary | ICD-10-CM

## 2020-08-13 DIAGNOSIS — J449 Chronic obstructive pulmonary disease, unspecified: Secondary | ICD-10-CM | POA: Diagnosis not present

## 2020-08-13 MED ORDER — FLUTICASONE PROPIONATE 50 MCG/ACT NA SUSP: 1.0000 | Freq: Two times a day (BID) | NASAL | 3 refills | Status: DC

## 2020-08-13 NOTE — Patient Instructions (Signed)
We discussed the options for the nodule seen in your lung.\  We are going to set up an appointment in 6 to 8 weeks, if you cannot make that appointment just call cancel and reschedule as needed.  Let us know if you decide to stay in [place] and we can refer you to a physician who can help you with this issue.  Call for any questions or concerns.

## 2020-08-13 NOTE — Progress Notes (Signed)
Subjective:    Patient ID: Nathaniel Little, male    DOB: 1946-10-03, 74 y.o.   MRN: [id number]  HPI This is a 74 year old current smoker, 3 cigarettes/day, whom we initially saw in consultation on 29 January 2020 for the issue of dyspnea.  At that time it was suspected that he had significant aortic stenosis and a 2D echo was ordered.  2D echo performed 07 Feb 2020 confirmed severe aortic stenosis.  A cardiac catheterization on 21 Feb 2020 which confirmed severe aortic stenosis with normal LV function.  He then underwent transcatheter aortic valve replacement on 01 April 2020. Since that procedure the patient has noted that the shortness of breath has improved dramatically.  Also is of working him up for aortic valve replacement, and incidental finding of a pulmonary nodule was noted.  This was 1.3 x 1.0 cm noted in the left lower lobe posteriorly close to the spine.  Head CT at that time showed only very mild hypermetabolism of this nodule with SUV of 2.0.  Follow-up CT scan was obtained on 22 August for left upper quadrant abdominal pain and back pain.  This showed that the nodule in question had grown slightly to 1.5 cm.  We are asked to render opinion as to best way to biopsy this.  I discussed the possibilities with the patient.  The patient is reluctant to undergo biopsies and would like to have it "taken out". The nodule is in a very difficult area to sample though navigational bronchoscopy could be an option.  We also discussed the possibilities of robotic wedge resection by thoracic surgery which the patient is interested in considering.  However, he tells me that he has come to a significant inheritance in [place] and is to go up there in the next few days.  He is uncertain whether he will come back to [place] or remain in [place].  I did advise him that this issue needs to be taken care of.  He understands that this needs to be done.  He is status post robotically assisted [place]  procedure in 2019 at [facility].  [place] in August 2021 he required biliary stent placement and dilatation of the biliary ductal system.  He has done well after that procedure.  He has not had any fevers, chills or sweats.  No cough or sputum production.  No hemoptysis.  With regards to his lung nodule he is totally asymptomatic.  Though he carries a diagnosis of COPD is on Symbicort and Spiriva and feels that this keeps him well controlled.  This that of chronic nasal congestion.  He has tried some over-the-counter nasal sprays with some results.  He believes the nasal spray was Nasacort or Flonase.  INTERVAL: Severe AS:s/p successful TAVR with a 29 mm Edwards Sapien 3 THV via the TF approach on 04/01/20.   Review of Systems A 10 point review of systems was performed and it is as noted above otherwise negative.  Patient Active Problem List   Diagnosis Date Noted  . Uncontrolled type 2 diabetes mellitus with hyperglycemia ([place]) 06/13/2020  . DM type 2 with diabetic peripheral neuropathy ([place]) 06/13/2020  . Post-operative pain 06/13/2020  . Tremor 06/13/2020  . Physical deconditioning 06/13/2020  . Dark stools 04/17/2020  . BRBPR (bright red blood per rectum) 04/17/2020  . S/P TAVR (transcatheter aortic valve replacement) 04/01/2020  . (HFpEF) heart failure with preserved ejection fraction ([place])   . Diabetes mellitus without complication ([place])   . Incidental pulmonary nodule,  greater than or equal to 39mm 03/11/2020  . Duodenitis 02/18/2020  . Diabetes ([place]) 12/06/2019  . Erectile dysfunction due to arterial insufficiency 09/24/2019  . Lumbar degenerative disc disease 06/25/2019  . Sacroiliac joint dysfunction of both sides 06/25/2019  . Chronic heart failure with preserved ejection fraction ([place]) 01/19/2019  . Tobacco abuse 01/19/2019  . Severe aortic stenosis   . Hyperlipidemia 07/20/2018  . IPMN (intraductal papillary mucinous neoplasm) 12/24/2017  . Claudication in peripheral vascular  disease ([place]) 08/22/2017  . Pancreatic mass 08/20/2017  . Aortic atherosclerosis ([place]) 07/25/2017  . BMI 38.0-38.9,adult 02/09/2017  . Arthritis pain of hip 01/07/2017  . Chronic obstructive pulmonary disease ([place]) 01/07/2017  . GERD (gastroesophageal reflux disease) 01/07/2017  . Essential hypertension 01/07/2017  . Depression 01/06/2017  . Sleep apnea with use of continuous positive airway pressure (CPAP) 01/06/2017  . Personal history of prostate cancer 03/04/2013   Social History   Tobacco Use  . Smoking status: Current Some Day Smoker    Packs/day: 4.00    Years: 68.00    Pack years: 272.00    Types: Cigarettes  . Smokeless tobacco: Former Systems developer    Types: Chew, [place] date: 2000  . Tobacco comment: 3 cigs day/ 08/13/2020  Substance Use Topics  . Alcohol use: Not Currently    Alcohol/week: 0.0 - 1.0 standard drinks    Comment: quit 40years ago   Initially from [place], exposed to follow-up from [place] accident.    Allergies  Allergen Reactions  . Codeine Itching   Current Meds  Medication Sig  . albuterol (PROVENTIL) (2.5 MG/3ML) 0.083% nebulizer solution Take 2.5 mg by nebulization every 6 (six) hours as needed for wheezing or shortness of breath.  [name] albuterol (VENTOLIN HFA) 108 (90 Base) MCG/ACT inhaler Inhale 2 puffs into the lungs every 6 (six) hours as needed for wheezing or shortness of breath.  [name] aspirin EC 81 MG tablet Take 81 mg by mouth daily.  [name] atorvastatin (LIPITOR) 80 MG tablet Take 80 mg by mouth daily.   . cloNIDine (CATAPRES) 0.1 MG tablet Take 0.1 mg by mouth 2 (two) times daily.  . clopidogrel (PLAVIX) 75 MG tablet TAKE 1 TABLET BY MOUTH ONCE DAILY  . diclofenac Sodium (VOLTAREN) 1 % GEL Apply 2 g topically in the morning and at bedtime.  . gabapentin (NEURONTIN) 100 MG capsule Take 1-2 capsules (100-200 mg total) by mouth at bedtime.  . hydrochlorothiazide (HYDRODIURIL) 25 MG tablet Take 1 tablet (25 mg total) by mouth daily.    . insulin glargine (LANTUS) 100 unit/mL SOPN Inject 20 Units into the skin at bedtime.  . metFORMIN (GLUCOPHAGE) 500 MG tablet Take 1,000 mg by mouth 2 (two) times daily.   [name] oxyCODONE (ROXICODONE) 5 MG immediate release tablet Take 1 tablet (5 mg total) by mouth every 4 (four) hours as needed for severe pain.  [name] SPIRIVA HANDIHALER 18 MCG inhalation capsule INHALE CONTENTS OF 1 CAPSULE ONCE DAILY (Patient taking differently: Place 18 mcg into inhaler and inhale daily. )  . SYMBICORT 80-4.5 MCG/ACT inhaler INHALE 2 PUFFS BY MOUTH TWICE DAILY (Patient taking differently: Inhale 2 puffs into the lungs 2 (two) times daily. )   Immunization History  Administered Date(s) Administered  . Influenza, High Dose Seasonal PF 08/09/2018  . Influenza,inj,Quad PF,6+ Mos 07/15/2019  . Influenza-Unspecified 07/12/2019, 08/04/2020  . Moderna SARS-COVID-2 Vaccination 11/22/2019, 12/20/2019  . PFIZER SARS-COV-2 Vaccination 10/29/2019  . Pneumococcal Conjugate-13 12/30/2017  Objective:   Physical Exam BP 108/62 (BP Location: Left Arm, Patient Position: Sitting, Cuff Size: Normal)   Pulse 74   Temp 98.4 F (36.9 C) (Temporal)   Ht 5\' 9"  (1.753 m)   Wt 205 lb 6.4 oz (93.2 kg)   SpO2 99%   BMI 30.33 kg/m  GENERAL: Well-developed gentleman, fully ambulatory in no respiratory distress. HEAD: Normocephalic, atraumatic.  EYES: Pupils equal, round, reactive to light.  No scleral icterus.  MOUTH: Nose/mouth/throat not examined due to masking requirements for COVID 19. NECK: Supple. No thyromegaly. No nodules. No JVD.  Trachea midline, no crepitus. PULMONARY: Lungs clear to auscultation bilaterally. CARDIOVASCULAR: S1 and S2. Regular rate and rhythm.  No rubs, murmurs or gallops heard.  GASTROINTESTINAL: Protuberant abdomen, soft. MUSCULOSKELETAL: No joint deformity, no clubbing, no edema.  NEUROLOGIC: He has a resting tremor, no overt focal deficits otherwise.  Speech is fluent.  No gait  disturbance. SKIN: Intact,warm,dry.  Limited exam shows no rashes. PSYCH: Mood and behavior normal.  CT image from 25 May 2020, images shown to the patient, independently reviewed:      Assessment & Plan:     ICD-10-CM   1. Incidental pulmonary nodule, greater than or equal to 37mm  R91.1    Discussed biopsy versus wedge resection with the patient Nodules amenable to wedge resection by robotic means Difficult access by ENB or percutaneous bx   2. Chronic obstructive pulmonary disease, unspecified COPD type ([place])  J44.9    Continue Symbicort and Spiriva Appears well compensated  3. Shortness of breath  R06.02    Markedly improved after TAVR  4. Nonrheumatic aortic valve stenosis  I35.0    Status post TAVR  5. Tobacco smoker, less than 10 cigarettes per day  F17.210    Counseled regards to discontinuation of all tobacco products  6. Non-seasonal allergic rhinitis due to pollen  J30.1    Flonase   Discussion:  The patient has an enlarging lung nodule that requires diagnosis.  However, as noted on the history the patient is to travel to [place] to take care of some family business/inheritance.  He is uncertain of whether he will remain in [place] or come back to [place].  Tentatively we have made an appointment for him in 6 to 8 weeks to discuss options at that time.  He would ideally like to have the lesion removed rather than going through a biopsy procedure.  Given the location of the lesion a robotic wedge resection would be a viable option.  The patient is to let us know what his future plans will hold.  If he remains in [place] we will be more than happy to refer him to a center that can undergo his multiple issues including the lung nodule.  I have conveyed the above findings to Dr. Janese Banks the patient's oncologist, via secure chat.  Renold Don, MD [facility]   *This note was dictated using voice recognition software/Dragon.  Despite best efforts  to proofread, errors can occur which can change the meaning.  Any change was purely unintentional.

## 2020-08-18 ENCOUNTER — Telehealth: Payer: Self-pay | Admitting: Pulmonary Disease

## 2020-08-18 NOTE — Telephone Encounter (Signed)
Lm for patient.  

## 2020-08-19 NOTE — Telephone Encounter (Signed)
Lm x2 for patient.  Will attempt to call once more due to nature of call.

## 2020-08-20 NOTE — Telephone Encounter (Signed)
Lm x3 for patient. Will close encounter per office protocol.

## 2020-09-09 ENCOUNTER — Ambulatory Visit: Payer: Self-pay

## 2020-09-09 NOTE — Chronic Care Management (AMB) (Signed)
  Care Management   Follow Up Note   09/09/2020 Name: Nathaniel Little MRN: [id number] DOB: 02-Mar-1946   Referred by: Verl Bangs, FNP Reason for referral : [place] is a 74 y.o. year old male who is a primary care patient of Lorine Bears, [name], FNP. The care management team was consulted for assistance with care management and care coordination needs.    Review of patient status, including review of consultants reports, relevant laboratory and other test results, and collaboration with appropriate care team members and the patient's provider was performed as part of comprehensive patient evaluation and provision of chronic care management services.    LCSW completed CCM outreach attempt today but was unable to reach patient successfully. A HIPPA compliant voice message was left encouraging patient to return call once available. LCSW rescheduled CCM SW appointment to 09/18/20 as well.   A HIPPA compliant phone message was left for the patient providing contact information and requesting a return call.   Eula Fried, BSW, MSW, [email or web address] Phone: [phone]

## 2020-09-11 ENCOUNTER — Other Ambulatory Visit: Payer: Self-pay

## 2020-09-11 ENCOUNTER — Encounter: Payer: Self-pay | Admitting: Family Medicine

## 2020-09-11 ENCOUNTER — Ambulatory Visit (INDEPENDENT_AMBULATORY_CARE_PROVIDER_SITE_OTHER): Payer: Medicare Other | Admitting: Family Medicine

## 2020-09-11 VITALS — BP 147/61 | HR 58 | Resp 18 | Ht 69.0 in | Wt 210.4 lb

## 2020-09-11 DIAGNOSIS — M25559 Pain in unspecified hip: Secondary | ICD-10-CM | POA: Insufficient documentation

## 2020-09-11 DIAGNOSIS — R251 Tremor, unspecified: Secondary | ICD-10-CM | POA: Diagnosis not present

## 2020-09-11 DIAGNOSIS — R911 Solitary pulmonary nodule: Secondary | ICD-10-CM

## 2020-09-11 DIAGNOSIS — E1142 Type 2 diabetes mellitus with diabetic polyneuropathy: Secondary | ICD-10-CM | POA: Diagnosis not present

## 2020-09-11 DIAGNOSIS — M1610 Unilateral primary osteoarthritis, unspecified hip: Secondary | ICD-10-CM

## 2020-09-11 LAB — POCT GLYCOSYLATED HEMOGLOBIN (HGB A1C): Hemoglobin A1C: 6.3 % — AB (ref 4.0–5.6)

## 2020-09-11 MED ORDER — TRAMADOL HCL 50 MG PO TABS: 25.0000 mg | ORAL_TABLET | Freq: Three times a day (TID) | ORAL | 0 refills | Status: AC | PRN

## 2020-09-11 NOTE — Assessment & Plan Note (Signed)
Provided with contact information for [facility], that was forwarded 06/2020 for Neurology referral.  Patient to contact Neurology to schedule appointment.

## 2020-09-11 NOTE — Assessment & Plan Note (Signed)
UncontrolledDM with A1c 6.3% improved from 7.0% on 06/12/2020 and goal A1c < 7.0%. - Complications - peripheral neuropathy, Overweight, GERD, HTN, OSA, HLD, Tobacco abuse  Plan:  1. Continue current therapy: metformin 1000mg  BID WC 2. Encourage improved lifestyle: - low carb/low glycemic diet reinforced prior education - Increase physical activity to 30 minutes most days of the week.  Explained that increased physical activity increases body's use of sugar for energy. 3. Check fasting am CBG and log these.  Bring log to next visit for review 4. Continue ASA and Statin 5. Advised to schedule DM ophtho exam, send record. 6. Follow-up 6 months

## 2020-09-11 NOTE — Progress Notes (Signed)
Subjective:    Patient ID: Nathaniel Little, male    DOB: [birth year], 74 y.o.   MRN: [id number]  Nathaniel Little is a 74 y.o. male presenting on 09/11/2020 for Hip Pain (Chronic Rt hip pain mostly when it cold outside. Now he is starting to feel intermittent pain in the Left hip x 1 week ) and Diabetes   HPI   [name] presents to clinic for follow up on his diabetes and for concerns of chronic hip pain.  Reports the pain has been chronic, he has followed with a local orthopedic provider and was given a prescription for topical diclofenac gel, has not found relief with this medication, is interested in a medication to help with his pain.  Diabetes Pt presents today for follow up Type 2 Diabetes Mellitus.  He/she (caps): He ACTION; IS/IS NOT: is not checking AM CBG at home . -Current diabetic medications include: metformin 1000mg  BID WC -ACTION; IS/IS NOT: is not currently symptomatic -Actions; denies/reports/admits to: denies polydipsia, polyphagia, polyuria, headaches, diaphoresis, shakiness, chills, pain, numbness or tingling in extremities or changes in vision -Clinical course has been improving  -Reports no exercise routine -Diet is high in salt, high in fat, and high in carbohydrates  PREVENTION Eye exam current (within 1 year) Due, encouraged Foot exam current (within 1 year) Up to date Lipid/ASCVD risk reduction - on statin: YES/NO: Yes  Kidney Protection (On ACE/ARB)? YES/NO: No    Depression screen [facility] 2/9 03/31/2020 12/04/2019 08/13/2019  Decreased Interest 0 0 0  Down, Depressed, Hopeless 0 1 1  PHQ - 2 Score 0 1 1  Altered sleeping - - 0  Tired, decreased energy - - 0  Change in appetite - - 0  Feeling bad or failure about yourself  - - 0  Trouble concentrating - - 0  Moving slowly or fidgety/restless - - 0  Suicidal thoughts - - 0  PHQ-9 Score - - 1  Difficult doing work/chores - - Not difficult at all  Some recent data might be hidden    Social History   Tobacco Use   . Smoking status: Current Some Day Smoker    Packs/day: 4.00    Years: 68.00    Pack years: 272.00    Types: Cigarettes  . Smokeless tobacco: Former Systems developer    Types: Chew, [place] date: 2000  . Tobacco comment: 3 cigs day/ 08/13/2020  Vaping Use  . Vaping Use: Never used  Substance Use Topics  . Alcohol use: Not Currently    Alcohol/week: 0.0 - 1.0 standard drinks    Comment: quit 40years ago  . Drug use: No    Review of Systems  Constitutional: Negative.   HENT: Negative.   Eyes: Negative.   Respiratory: Negative.   Cardiovascular: Negative.   Gastrointestinal: Negative.   Endocrine: Negative.   Genitourinary: Negative.   Musculoskeletal: Positive for arthralgias. Negative for back pain, gait problem, joint swelling, myalgias, neck pain and neck stiffness.  Skin: Negative.   Allergic/Immunologic: Negative.   Neurological: Negative.   Hematological: Negative.   Psychiatric/Behavioral: Negative.    Per HPI unless specifically indicated above     Objective:    BP (!) 147/61 (BP Location: Right Arm, Patient Position: Sitting, Cuff Size: Normal)   Pulse (!) 58   Resp 18   Ht 5\' 9"  (1.753 m)   Wt 210 lb 6.4 oz (95.4 kg)   SpO2 99%   BMI 31.07 kg/m   Wt Readings from  Last 3 Encounters:  09/11/20 210 lb 6.4 oz (95.4 kg)  08/13/20 205 lb 6.4 oz (93.2 kg)  06/19/20 198 lb (89.8 kg)    Physical Exam Vitals and nursing note reviewed.  Constitutional:      General: He is not in acute distress.    Appearance: Normal appearance. He is well-developed and well-groomed. He is obese. He is not ill-appearing or toxic-appearing.  HENT:     Head: Normocephalic and atraumatic.     Nose:     Comments: Lizbeth Bark is in place, covering mouth and nose. Eyes:     General:        Right eye: No discharge.        Left eye: No discharge.     Extraocular Movements: Extraocular movements intact.     Conjunctiva/sclera: Conjunctivae normal.     Pupils: Pupils are equal, round,  and reactive to light.  Cardiovascular:     Rate and Rhythm: Normal rate and regular rhythm.     Pulses: Normal pulses.     Heart sounds: Normal heart sounds. No murmur heard. No friction rub. No gallop.   Pulmonary:     Effort: Pulmonary effort is normal. No respiratory distress.     Breath sounds: Normal breath sounds.  Musculoskeletal:        General: Tenderness present.     Cervical back: Normal.     Thoracic back: Normal.     Lumbar back: Normal.     Right hip: Tenderness present. Decreased range of motion. Normal strength.     Left hip: Tenderness present. Decreased range of motion. Normal strength.     Right lower leg: No edema.     Left lower leg: No edema.  Skin:    General: Skin is warm and dry.     Capillary Refill: Capillary refill takes less than 2 seconds.  Neurological:     General: No focal deficit present.     Mental Status: He is alert and oriented to person, place, and time.  Psychiatric:        Attention and Perception: Attention and perception normal.        Mood and Affect: Mood and affect normal.        Speech: Speech normal.        Behavior: Behavior normal. Behavior is cooperative.        Thought Content: Thought content normal.        Cognition and Memory: Cognition and memory normal.    Results for orders placed or performed in visit on 09/11/20  POCT glycosylated hemoglobin (Hb A1C)  Result Value Ref Range   Hemoglobin A1C 6.3 (A) 4.0 - 5.6 %   HbA1c POC (<> result, manual entry)     HbA1c, POC (prediabetic range)     HbA1c, POC (controlled diabetic range)        Assessment & Plan:   Problem List Items Addressed This Visit      Endocrine   DM type 2 with diabetic peripheral neuropathy ([place]) - Primary    UncontrolledDM with A1c 6.3% improved from 7.0% on 06/12/2020 and goal A1c < 7.0%. - Complications - peripheral neuropathy, Overweight, GERD, HTN, OSA, HLD, Tobacco abuse  Plan:  1. Continue current therapy: metformin 1000mg  BID WC 2.  Encourage improved lifestyle: - low carb/low glycemic diet reinforced prior education - Increase physical activity to 30 minutes most days of the week.  Explained that increased physical activity increases body's use of sugar for energy. 3.  Check fasting am CBG and log these.  Bring log to next visit for review 4. Continue ASA and Statin 5. Advised to schedule DM ophtho exam, send record. 6. Follow-up 6 months       Relevant Orders   POCT glycosylated hemoglobin (Hb A1C) (Completed)     Other   Arthritis pain of hip    Reports bilateral hip pain, has followed with [facility] in the past.  Has tried Voltaren gel and diclofenac without help in symptoms.  Encouraged to schedule f/u appt with Dr. Mack Guise at [facility] and will send in small one time prescription for tramadol.  Patient in agreement with plan and will schedule f/u appointment with Orthopedics.      Relevant Medications   traMADol (ULTRAM) 50 MG tablet   Incidental pulmonary nodule, greater than or equal to 79mm    Encouraged call to [facility] to discuss request for bronchoscopy procedure.  Patient reports he will contact them and schedule an appointment.      Tremor    Provided with contact information for [facility], that was forwarded 06/2020 for Neurology referral.  Patient to contact Neurology to schedule appointment.         Meds ordered this encounter  Medications  . traMADol (ULTRAM) 50 MG tablet    Sig: Take 0.5-1 tablets (25-50 mg total) by mouth every 8 (eight) hours as needed for up to 5 days.    Dispense:  15 tablet    Refill:  0   Follow up plan: Return in about 6 months (around 03/12/2021) for HTN & DM F/U.   Harlin Rain, [place] Family Nurse Practitioner [facility] 09/11/2020, 12:07 PM

## 2020-09-11 NOTE — Patient Instructions (Addendum)
Contact [facility] in [place] to discuss the Bronchoscopy  Phone# [phone]  To contact [facility] [phone]  To schedule an appointment for the referral placed for tremor  To continue all medications as directed  You can learn more information online about your diabetes at American Diabetes Association: http://www.diabetes.org/ - General self-care (diet, medications, blood sugar checks). - Diet recommendations - There are even recipes available for you to look at and try.   Schedule a follow up appointment with [facility] for your recurrent hip pain.  I have sent in a one time prescription for tramadol to help with your hip pain.  Follow up with [facility] for additional management of your hip pain  We will plan to see you back in 6 months for diabetes follow up visit  You will receive a survey after today's visit either digitally by e-mail or paper by [place] mail. Your experiences and feedback matter to [name].  Please respond so we know how we are doing as we provide care for you.  Call us with any questions/concerns/needs.  It is my goal to be available to you for your health concerns.  Thanks for choosing me to be a partner in your healthcare needs!  Harlin Rain, FNP-C Family Nurse Practitioner [facility] Phone: [phone]

## 2020-09-11 NOTE — Assessment & Plan Note (Signed)
Encouraged call to [facility] to discuss request for bronchoscopy procedure.  Patient reports he will contact them and schedule an appointment.

## 2020-09-11 NOTE — Assessment & Plan Note (Signed)
Reports bilateral hip pain, has followed with [facility] in the past.  Has tried Voltaren gel and diclofenac without help in symptoms.  Encouraged to schedule f/u appt with Dr. Mack Guise at [facility] and will send in small one time prescription for tramadol.  Patient in agreement with plan and will schedule f/u appointment with Orthopedics.

## 2020-09-15 ENCOUNTER — Telehealth: Payer: Self-pay

## 2020-09-15 ENCOUNTER — Telehealth: Payer: Self-pay | Admitting: General Practice

## 2020-09-15 NOTE — Telephone Encounter (Signed)
  Chronic Care Management   Outreach Note  09/15/2020 Name: Nathaniel Little MRN: [id number] DOB: 1946/06/07  Referred by: Verl Bangs, FNP Reason for referral : No chief complaint on file.   Nathaniel Little is enrolled in a Managed Medicaid Health Plan: No  An unsuccessful telephone outreach was attempted today. The patient was referred to the case management team for assistance with care management and care coordination.   Follow Up Plan: The care management team will reach out to the patient again over the next 30 to 60 days.   Noreene Larsson RN, MSN, [place] Mobile: [phone]

## 2020-09-16 ENCOUNTER — Other Ambulatory Visit: Payer: Self-pay

## 2020-09-16 ENCOUNTER — Ambulatory Visit
Admission: RE | Admit: 2020-09-16 | Discharge: 2020-09-16 | Disposition: A | Discharge status: Home / Self Care | Payer: Medicare Other | Source: Ambulatory Visit | Attending: Oncology | Admitting: Oncology

## 2020-09-16 DIAGNOSIS — I700 Atherosclerosis of aorta: Secondary | ICD-10-CM | POA: Diagnosis not present

## 2020-09-16 DIAGNOSIS — R911 Solitary pulmonary nodule: Secondary | ICD-10-CM | POA: Diagnosis not present

## 2020-09-16 DIAGNOSIS — R918 Other nonspecific abnormal finding of lung field: Secondary | ICD-10-CM | POA: Insufficient documentation

## 2020-09-16 DIAGNOSIS — M5134 Other intervertebral disc degeneration, thoracic region: Secondary | ICD-10-CM | POA: Diagnosis not present

## 2020-09-16 DIAGNOSIS — J432 Centrilobular emphysema: Secondary | ICD-10-CM | POA: Diagnosis not present

## 2020-09-16 DIAGNOSIS — I2510 Atherosclerotic heart disease of native coronary artery without angina pectoris: Secondary | ICD-10-CM | POA: Diagnosis not present

## 2020-09-17 ENCOUNTER — Telehealth: Payer: Self-pay

## 2020-09-17 NOTE — Chronic Care Management (AMB) (Signed)
  Care Management   Note  09/17/2020 Name: [name] MRN: [id number] DOB: 1946-04-25  Nathaniel Little is a 74 y.o. year old male who is a primary care patient of [name], [name], [place] and is actively engaged with the care management team. I reached out to [name] by phone today to assist with re-scheduling a follow up visit with the RN Case Manager  Follow up plan: Unsuccessful telephone outreach attempt made. A HIPAA compliant phone message was left for the patient providing contact information and requesting a return call.  The care management team will reach out to the patient again over the next 7 days.  If patient returns call to provider office, please advise to call [facility]  at [place], [place], [place], [place] [address] Direct Dial: [phone] [email or web address]: [email or web address]

## 2020-09-18 ENCOUNTER — Ambulatory Visit: Payer: Self-pay | Admitting: Licensed Clinical Social Worker

## 2020-09-18 ENCOUNTER — Telehealth: Payer: Self-pay

## 2020-09-18 NOTE — Chronic Care Management (AMB) (Signed)
  Care Management   Follow Up Note   09/18/2020 Name: Nathaniel Little MRN: [id number] DOB: May 31, 1946  Referred by: Verl Bangs, FNP Reason for referral : [name] is a 74 y.o. year old male who is a primary care patient of Lorine Bears, [name], FNP. The care management team was consulted for assistance with care management and care coordination needs.    Review of patient status, including review of consultants reports, relevant laboratory and other test results, and collaboration with appropriate care team members and the patient's provider was performed as part of comprehensive patient evaluation and provision of chronic care management services.    LCSW completed CCM outreach attempt today but was unable to reach patient successfully. A HIPPA compliant voice message was unable to be left as mailbox was full. CCM LCSW has now completed three unsuccessful outreaches and will close case for CCM Social Work. CCM LCSW will notify PCP and close referral at this time.  LCSW has been unable to maintain contact with the patient for follow up. The care management team is available to follow up with the patient after provider conversation with the patient regarding recommendation for care management engagement and subsequent re-referral to the care management team.   Eula Fried, BSW, MSW, [email or web address] Phone: [phone]

## 2020-09-19 ENCOUNTER — Encounter: Payer: Self-pay | Admitting: Oncology

## 2020-09-19 ENCOUNTER — Inpatient Hospital Stay: Payer: Medicare Other | Attending: Oncology | Admitting: Oncology

## 2020-09-19 ENCOUNTER — Telehealth: Payer: Self-pay | Admitting: Pulmonary Disease

## 2020-09-19 VITALS — BP 159/67 | HR 72 | Temp 97.3°F | Resp 18 | Ht 69.0 in | Wt 206.3 lb

## 2020-09-19 DIAGNOSIS — M129 Arthropathy, unspecified: Secondary | ICD-10-CM | POA: Diagnosis not present

## 2020-09-19 DIAGNOSIS — R918 Other nonspecific abnormal finding of lung field: Secondary | ICD-10-CM | POA: Diagnosis not present

## 2020-09-19 DIAGNOSIS — R911 Solitary pulmonary nodule: Secondary | ICD-10-CM | POA: Diagnosis not present

## 2020-09-19 DIAGNOSIS — J449 Chronic obstructive pulmonary disease, unspecified: Secondary | ICD-10-CM | POA: Diagnosis not present

## 2020-09-19 DIAGNOSIS — I5032 Chronic diastolic (congestive) heart failure: Secondary | ICD-10-CM | POA: Diagnosis not present

## 2020-09-19 DIAGNOSIS — Z794 Long term (current) use of insulin: Secondary | ICD-10-CM | POA: Diagnosis not present

## 2020-09-19 DIAGNOSIS — R609 Edema, unspecified: Secondary | ICD-10-CM | POA: Insufficient documentation

## 2020-09-19 DIAGNOSIS — Z7982 Long term (current) use of aspirin: Secondary | ICD-10-CM | POA: Insufficient documentation

## 2020-09-19 DIAGNOSIS — I110 Hypertensive heart disease with heart failure: Secondary | ICD-10-CM | POA: Insufficient documentation

## 2020-09-19 DIAGNOSIS — Z803 Family history of malignant neoplasm of breast: Secondary | ICD-10-CM | POA: Insufficient documentation

## 2020-09-19 DIAGNOSIS — Z79899 Other long term (current) drug therapy: Secondary | ICD-10-CM | POA: Insufficient documentation

## 2020-09-19 DIAGNOSIS — E669 Obesity, unspecified: Secondary | ICD-10-CM | POA: Diagnosis not present

## 2020-09-19 DIAGNOSIS — E119 Type 2 diabetes mellitus without complications: Secondary | ICD-10-CM | POA: Insufficient documentation

## 2020-09-19 DIAGNOSIS — F17210 Nicotine dependence, cigarettes, uncomplicated: Secondary | ICD-10-CM | POA: Insufficient documentation

## 2020-09-19 DIAGNOSIS — I700 Atherosclerosis of aorta: Secondary | ICD-10-CM | POA: Diagnosis not present

## 2020-09-19 DIAGNOSIS — I350 Nonrheumatic aortic (valve) stenosis: Secondary | ICD-10-CM | POA: Diagnosis not present

## 2020-09-19 DIAGNOSIS — G4730 Sleep apnea, unspecified: Secondary | ICD-10-CM | POA: Insufficient documentation

## 2020-09-19 NOTE — Progress Notes (Signed)
Noted, will discuss with you.

## 2020-09-19 NOTE — Telephone Encounter (Signed)
Per Dr. Beatris Si to discuss to possible bronch.  Attempted to call patient--unable to leave vm due to mailbox being full

## 2020-09-19 NOTE — Progress Notes (Signed)
Joint pain from waist down / pain today 3

## 2020-09-19 NOTE — Progress Notes (Signed)
Hematology/Oncology Consult note [facility]  [phone] [phone]  Patient Care Team: Verl Bangs, FNP as PCP - General (Family Medicine) Minna Merritts, MD as PCP - Cardiology (Cardiology) Clent Jacks, RN as Registered Nurse Rockey Situ, Kathlene November, MD as Consulting Physician (Cardiology) Vanita Ingles, RN as Case Manager ([place]) Vella Raring, [facility] as Pharmacist   Name of the patient: Nathaniel Little  [id number]  May 05, 1946   Date of visit: 09/19/20  Diagnosis- 1.  History of IPMN s/p Whipple's 2.  Lung nodule  Chief complaint/ Reason for visit-routine follow-up of lung nodule  Heme/Onc history: Patient is a 74 year old male who was last seen by me in 2018 for cystic mass in the pancreas which was ultimately identified as IPMN for which she underwent Whipple surgery at [facility].  Recently again he was admitted to the hospital for symptoms of pancreatitis following ERCP for choledocholithiasis.  Patient had undergone CT angio chest on 05/25/2020 for symptoms of left upper quadrant abdominal pain which incidentally showed a solid 1.5 cm left lower lobe pulmonary nodule which had increased from 1.4 cm on prior CT in June 2021.  He has also undergone a PET scan in June 2021 which showed mild hypermetabolism in this area concerning for primary bronchogenic neoplasm.  No locoregional adenopathy or distant metastatic disease.  Patient has not undergone biopsy of this nodule yet.  Patient lives alone and is independent of his ADLs but needs a motorized wheelchair.   Interval history-patient reports feeling well and other than chronic fatigue denies any new complaints at this time  ECOG PS- 2 Pain scale- 0   Review of systems- Review of Systems  Constitutional: Positive for malaise/fatigue. Negative for chills, fever and weight loss.  HENT: Negative for congestion, ear discharge and nosebleeds.   Eyes: Negative for  blurred vision.  Respiratory: Negative for cough, hemoptysis, sputum production, shortness of breath and wheezing.   Cardiovascular: Negative for chest pain, palpitations, orthopnea and claudication.  Gastrointestinal: Negative for abdominal pain, blood in stool, constipation, diarrhea, heartburn, melena, nausea and vomiting.  Genitourinary: Negative for dysuria, flank pain, frequency, hematuria and urgency.  Musculoskeletal: Negative for back pain, joint pain and myalgias.  Skin: Negative for rash.  Neurological: Negative for dizziness, tingling, focal weakness, seizures, weakness and headaches.  Endo/Heme/Allergies: Does not bruise/bleed easily.  Psychiatric/Behavioral: Negative for depression and suicidal ideas. The patient does not have insomnia.       Allergies  Allergen Reactions  . Codeine Itching     Past Medical History:  Diagnosis Date  . (HFpEF) heart failure with preserved ejection fraction ([place])    a. 08/2017 Echo: EF 60-65%, Gr1 DD.  [name] Arthritis   . COPD (chronic obstructive pulmonary disease) ([place])   . Depression   . Diabetes mellitus without complication ([place])   . Hypertension   . Incidental pulmonary nodule, greater than or equal to 47mm 03/11/2020   Left lower lobe - suspicious for bronchogenic neoplasm  . Lymphedema   . Morbid obesity ([place])   . Prostate cancer ([place]) 03/2013   ? PANCREATIC  . S/P TAVR (transcatheter aortic valve replacement) 04/01/2020   s/p TAVR with a 29 mm Edwards Sapien 3 THV via the TF approach by Drs Buena Irish & Roxy Manns  . Severe aortic stenosis   . Sleep apnea with use of continuous positive airway pressure (CPAP)   . Tobacco abuse   . Tremor    HEAD  Past Surgical History:  Procedure Laterality Date  . ADENOIDECTOMY    . APPENDECTOMY    . CARDIAC CATHETERIZATION    . CATARACT EXTRACTION W/PHACO Left 11/28/2018   Procedure: CATARACT EXTRACTION PHACO AND INTRAOCULAR LENS PLACEMENT (IOC) LEFT, DIABETIC;  Surgeon: Birder Robson, MD;  Location: [facility] ORS;  Service: Ophthalmology;  Laterality: Left;  [name]  00:56 CDE 9.44 Fluid pack lot # [id number]  . EUS N/A 10/13/2017   Procedure: FULL UPPER ENDOSCOPIC ULTRASOUND (EUS) RADIAL;  Surgeon: Holly Bodily, MD;  Location: [facility] ENDOSCOPY;  Service: Gastroenterology;  Laterality: N/A;  . PANCREATICODUODENECTOMY  07/23/2018  . RIGHT/LEFT HEART CATH AND CORONARY ANGIOGRAPHY N/A 02/21/2020   Procedure: RIGHT/LEFT HEART CATH AND CORONARY ANGIOGRAPHY;  Surgeon: Minna Merritts, MD;  Location: [place] CV LAB;  Service: Cardiovascular;  Laterality: N/A;  . TEE WITHOUT CARDIOVERSION N/A 04/01/2020   Procedure: TRANSESOPHAGEAL ECHOCARDIOGRAM (TEE);  Surgeon: Burnell Blanks, MD;  Location: [place] CV LAB;  Service: Open Heart Surgery;  Laterality: N/A;  . TONSILLECTOMY    . TRANSCATHETER AORTIC VALVE REPLACEMENT, TRANSFEMORAL N/A 04/01/2020   Procedure: TRANSCATHETER AORTIC VALVE REPLACEMENT, TRANSFEMORAL;  Surgeon: Burnell Blanks, MD;  Location: [place] CV LAB;  Service: Open Heart Surgery;  Laterality: N/A;  . wipple      Social History   Socioeconomic History  . Marital status: Widowed    Spouse name: Not on file  . Number of children: 2  . Years of education: Not on file  . Highest education level: Not on file  Occupational History  . Occupation: retired Social research officer, government  Tobacco Use  . Smoking status: Current Some Day Smoker    Packs/day: 4.00    Years: 68.00    Pack years: 272.00    Types: Cigarettes  . Smokeless tobacco: Former Systems developer    Types: Chew, [place] date: 2000  . Tobacco comment: 3 cigs day/ 08/13/2020  Vaping Use  . Vaping Use: Never used  Substance and Sexual Activity  . Alcohol use: Not Currently    Alcohol/week: 0.0 - 1.0 standard drinks    Comment: quit 40years ago  . Drug use: No  . Sexual activity: Not Currently  Other Topics Concern  . Not on file  Social History Narrative   Living at  [place] Strain: Medium Risk  . Difficulty of Paying Living Expenses: Somewhat hard  Food Insecurity: No Food Insecurity  . Worried About Charity fundraiser in the Last Year: Never true  . Ran Out of Food in the Last Year: Never true  Transportation Needs: No Transportation Needs  . Lack of Transportation (Medical): No  . Lack of Transportation (Non-Medical): No  Physical Activity: Insufficiently Active  . Days of Exercise per Week: 3 days  . Minutes of Exercise per Session: 10 min  Stress: No Stress Concern Present  . Feeling of Stress : Only a little  Social Connections: Moderately Isolated  . Frequency of Communication with Friends and Family: More than three times a week  . Frequency of Social Gatherings with Friends and Family: More than three times a week  . Attends Religious Services: More than 4 times per year  . Active Member of Clubs or Organizations: No  . Attends Archivist Meetings: Never  . Marital Status: Widowed  Intimate Partner Violence: Not At Risk  . Fear of Current or Ex-Partner: No  .  Emotionally Abused: No  . Physically Abused: No  . Sexually Abused: No    Family History  Problem Relation Age of Onset  . Breast cancer Mother   . Cancer Father        Black Lung  . Cerebral palsy Brother   . Tuberculosis Paternal Uncle   . Prostate cancer Neg Hx   . Kidney cancer Neg Hx   . Bladder Cancer Neg Hx      Current Outpatient Medications:  .  albuterol (PROVENTIL) (2.5 MG/3ML) 0.083% nebulizer solution, Take 2.5 mg by nebulization every 6 (six) hours as needed for wheezing or shortness of breath., Disp: , Rfl:  .  albuterol (VENTOLIN HFA) 108 (90 Base) MCG/ACT inhaler, Inhale 2 puffs into the lungs every 6 (six) hours as needed for wheezing or shortness of breath., Disp: 6.7 g, Rfl: 2 .  aspirin EC 81 MG tablet, Take 81 mg by mouth daily., Disp: , Rfl:  .  atorvastatin (LIPITOR) 80  MG tablet, Take 80 mg by mouth daily. , Disp: , Rfl:  .  carvedilol (COREG) 3.125 MG tablet, Take 1 tablet (3.125 mg total) by mouth 2 (two) times daily., Disp: 60 tablet, Rfl: 5 .  cloNIDine (CATAPRES) 0.1 MG tablet, Take 0.1 mg by mouth 2 (two) times daily., Disp: , Rfl:  .  clopidogrel (PLAVIX) 75 MG tablet, TAKE 1 TABLET BY MOUTH ONCE DAILY, Disp: 90 tablet, Rfl: 2 .  fluticasone (FLONASE) 50 MCG/ACT nasal spray, Place 1 spray into both nostrils in the morning and at bedtime., Disp: 15.8 mL, Rfl: 3 .  gabapentin (NEURONTIN) 100 MG capsule, Take 1-2 capsules (100-200 mg total) by mouth at bedtime., Disp: 180 capsule, Rfl: 1 .  hydrochlorothiazide (HYDRODIURIL) 25 MG tablet, Take 1 tablet (25 mg total) by mouth daily., Disp: 30 tablet, Rfl: 5 .  insulin glargine (LANTUS) 100 unit/mL SOPN, Inject 20 Units into the skin at bedtime., Disp: , Rfl:  .  metFORMIN (GLUCOPHAGE) 500 MG tablet, Take 1,000 mg by mouth 2 (two) times daily. , Disp: , Rfl:  .  pantoprazole (PROTONIX) 40 MG tablet, Take 1 tablet (40 mg total) by mouth 2 (two) times daily., Disp: 60 tablet, Rfl: 11 .  SPIRIVA HANDIHALER 18 MCG inhalation capsule, INHALE CONTENTS OF 1 CAPSULE ONCE DAILY (Patient taking differently: Place 18 mcg into inhaler and inhale daily.), Disp: 30 capsule, Rfl: 5 .  SYMBICORT 80-4.5 MCG/ACT inhaler, INHALE 2 PUFFS BY MOUTH TWICE DAILY (Patient taking differently: Inhale 2 puffs into the lungs 2 (two) times daily.), Disp: 10.2 g, Rfl: 3 .  traZODone (DESYREL) 100 MG tablet, Take by mouth., Disp: , Rfl:   Physical exam:  Vitals:   09/19/20 0900  BP: (!) 159/67  Pulse: 72  Resp: 18  Temp: (!) 97.3 F (36.3 C)  TempSrc: Tympanic  SpO2: 98%  Weight: 206 lb 4.8 oz (93.6 kg)  Height: 5\' 9"  (1.753 m)   Physical Exam HENT:     Head: Normocephalic and atraumatic.  Eyes:     Extraocular Movements: EOM normal.     Pupils: Pupils are equal, round, and reactive to light.  Cardiovascular:     Rate and  Rhythm: Normal rate and regular rhythm.     Heart sounds: Normal heart sounds.  Pulmonary:     Effort: Pulmonary effort is normal.     Breath sounds: Normal breath sounds.  Abdominal:     General: Bowel sounds are normal.     Palpations: Abdomen is soft.  Musculoskeletal:     Cervical back: Normal range of motion.  Skin:    General: Skin is warm and dry.  Neurological:     Mental Status: He is alert and oriented to person, place, and time.      CMP Latest Ref Rng & Units 05/25/2020  Glucose 70 - 99 mg/dL 226(H)  BUN 8 - 23 mg/dL 20  Creatinine 0.61 - 1.24 mg/dL 1.00  Sodium 135 - 145 mmol/L 134(L)  Potassium 3.5 - 5.1 mmol/L 3.4(L)  Chloride 98 - 111 mmol/L 96(L)  CO2 22 - 32 mmol/L 25  Calcium 8.9 - 10.3 mg/dL 9.2  Total Protein 6.5 - 8.1 g/dL 7.4  Total Bilirubin 0.3 - 1.2 mg/dL 1.5(H)  Alkaline Phos 38 - 126 U/L 233(H)  AST 15 - 41 U/L 33  ALT 0 - 44 U/L 65(H)   CBC Latest Ref Rng & Units 05/25/2020  WBC 4.0 - 10.5 K/uL 22.9(H)  Hemoglobin 13.0 - 17.0 g/dL 12.5(L)  Hematocrit 39.0 - 52.0 % 37.1(L)  Platelets 150 - 400 K/uL 266    No images are attached to the encounter.  CT Chest Wo Contrast  Result Date: 09/16/2020 CLINICAL DATA:  Follow-up lung nodule. History of Whipple procedure. EXAM: CT CHEST WITHOUT CONTRAST TECHNIQUE: Multidetector CT imaging of the chest was performed following the standard protocol without IV contrast. COMPARISON:  05/25/2020 FINDINGS: Cardiovascular: Mild cardiac enlargement. No pericardial effusion. Status post aortic valvuloplasty. Aortic atherosclerosis. Coronary artery calcifications. Mediastinum/Nodes: No enlarged mediastinal or axillary lymph nodes. Thyroid gland, trachea, and esophagus demonstrate no significant findings. Lungs/Pleura: Mild centrilobular emphysema. Nodule within the medial left lower lobe measures 1.5 cm, image 77/3. No new lung nodules. The interval resolution of bibasilar atelectasis. Upper Abdomen: No acute  abnormality. Status post choledocho jejunostomy. Aortic atherosclerosis. Musculoskeletal: Bones appear diffusely osteopenic. Multilevel degenerative disc disease identified within the thoracic spine. IMPRESSION: 1. Interval resolution of bibasilar atelectasis. 2. Stable appearance of left lower lobe lung nodule measuring 1.5 cm. Cannot exclude primary bronchogenic carcinoma. 3. Emphysema and aortic atherosclerosis. 4. Coronary artery calcifications noted. Aortic Atherosclerosis (ICD10-I70.0) and Emphysema (ICD10-J43.9). Electronically Signed   By: Kerby Moors M.D.   On: 09/16/2020 13:05     Assessment and plan- Patient is a 74 y.o. male with history of left lower lobe lung nodule here to discuss CT scan results and further management  I have reviewed CT chest results independently and discussed findings with the patient.  He is noted to have a 1.5 cm left lower lobe lung nodule which has remained essentially stable since August 2021.  He was also seen by Dr. Patsey Berthold in November 2021 At that time he was considering going to [place] and therefore was not worked up further at that time.  Patient presently states that he has no plans to go to [place] and will be staying here for the foreseeable future.  I discussed the following options for his lung nodule  1.  Wedge resection by CT surgery and I will refer him to [facility] for this as Dr. Genevive Bi is currently on medical leave.  2.  If he is not deemed to be a candidate for wedge resection he will follow-up with Dr. Patsey Berthold to see if a targeted biopsy is possible versus if Dr. Donella Stade would be willing to offer him empiric radiation treatment with antibiotics  I have informed the lung cancer navigator RN [place] about this and she will be coordinating his future appointments.  His appointment with me in  the future will be based on further recommendations by CT surgery and pulmonary.  Visit Diagnosis 1. Lung nodule      Dr. Randa Evens,  MD, MPH [facility] [phone] 09/19/2020 12:57 PM

## 2020-09-22 NOTE — Telephone Encounter (Signed)
Spoke to patient and offered OV for 10/22/2019 at 11:00. Patient requested that I call back, as he did not have a pen or paper to write appointment down.  Will call back.

## 2020-09-23 NOTE — Telephone Encounter (Signed)
Called and spoke with patient to get him scheduled with Dr. Patsey Berthold to discuss bronch procedure. The appointment mentioned in last note was gone so patient has been scheduled with her on 1/25 at 11:30. Nothing further needed at this time.

## 2020-09-24 NOTE — Telephone Encounter (Signed)
Pt has been rescheduled. 

## 2020-09-24 NOTE — Chronic Care Management (AMB) (Signed)
  Care Management   Note  09/24/2020 Name: Nathaniel Little MRN: [id number] DOB: 1945/11/10  Nathaniel Little is a 74 y.o. year old male who is a primary care patient of [name], [name], FNP and is actively engaged with the care management team. I reached out to [name] by phone today to assist with re-scheduling a follow up visit with the RN Case Manager Licensed Clinical Social Worker  Follow up plan: Telephone appointment with care management team member scheduled [id number] Clinical Social Worker 09/25/2020 RN CM 10/21/2019  Noreene Larsson, [place], [place], [place] [address] Direct Dial: [phone] [email or web address]: [email or web address]

## 2020-09-25 ENCOUNTER — Telehealth: Payer: Medicare Other

## 2020-09-30 ENCOUNTER — Encounter: Payer: Medicare Other | Admitting: Thoracic Surgery (Cardiothoracic Vascular Surgery)

## 2020-10-02 ENCOUNTER — Encounter: Payer: Medicare Other | Admitting: Thoracic Surgery (Cardiothoracic Vascular Surgery)

## 2020-10-06 ENCOUNTER — Encounter: Payer: Medicare Other | Admitting: Thoracic Surgery (Cardiothoracic Vascular Surgery)

## 2020-10-09 ENCOUNTER — Other Ambulatory Visit: Payer: Self-pay | Admitting: Family Medicine

## 2020-10-09 DIAGNOSIS — J431 Panlobular emphysema: Secondary | ICD-10-CM

## 2020-10-10 ENCOUNTER — Other Ambulatory Visit: Payer: Self-pay

## 2020-10-10 DIAGNOSIS — J431 Panlobular emphysema: Secondary | ICD-10-CM

## 2020-10-10 MED ORDER — BUDESONIDE-FORMOTEROL FUMARATE 80-4.5 MCG/ACT IN AERO: 2.0000 | INHALATION_SPRAY | Freq: Two times a day (BID) | RESPIRATORY_TRACT | 3 refills | Status: DC

## 2020-10-20 ENCOUNTER — Telehealth: Payer: Medicare Other | Admitting: General Practice

## 2020-10-20 ENCOUNTER — Ambulatory Visit: Payer: Self-pay | Admitting: General Practice

## 2020-10-20 DIAGNOSIS — J449 Chronic obstructive pulmonary disease, unspecified: Secondary | ICD-10-CM

## 2020-10-20 DIAGNOSIS — E1142 Type 2 diabetes mellitus with diabetic polyneuropathy: Secondary | ICD-10-CM

## 2020-10-20 DIAGNOSIS — Z720 Tobacco use: Secondary | ICD-10-CM

## 2020-10-20 DIAGNOSIS — I10 Essential (primary) hypertension: Secondary | ICD-10-CM

## 2020-10-20 NOTE — Patient Instructions (Signed)
Visit Information  Patient Care Plan: RNCM: COPD (Adult)    Problem Identified: RNCM: Psychological Adjustment to Diagnosis (COPD)   Priority: Medium    Goal: RNCM: Management of COPD   Priority: Medium  Note:   Current Barriers:  [name] Knowledge deficits related to basic understanding of COPD disease process . Knowledge deficits related to basic COPD self care/management . Knowledge deficit related to basic understanding of how to use inhalers and how inhaled medications work . Knowledge deficit related to importance of energy conservation . Limited Social Support . Unable to independently manage COPD . Lacks social connections . Does not contact provider office for questions/concerns  Case Manager Clinical Goal(s):  Over the next 120 days patient will report using inhalers as prescribed including rinsing mouth after use  Over the next 120 days patient will report utilizing pursed lip breathing for shortness of breath  Over the next 120 days, patient will be able to verbalize understanding of COPD action plan and when to seek appropriate levels of medical care  Over the next 120 days, patient will engage in lite exercise as tolerated to build/regain stamina and strength and reduce shortness of breath through activity tolerance  Over the next 120 days, patient will verbalize basic understanding of COPD disease process and self care activities  Over the next 120 days, patient will not be hospitalized for COPD exacerbation  Interventions:  . Collaboration with [name], [name], FNP regarding development and update of comprehensive plan of care as evidenced by provider attestation and co-signature . Inter-disciplinary care team collaboration (see longitudinal plan of care)  Provided patient with basic written and verbal COPD education on self care/management/and exacerbation prevention   Provided patient with COPD action plan and reinforced importance of daily self  assessment  Discussed Pulmonary Rehab and offered to assist with referral placement  Provided written and verbal instructions on pursed lip breathing and utilized returned demonstration as teach back  Provided instruction about proper use of medications used for management of COPD including inhalers  Advised patient to self assesses COPD action plan zone and make appointment with provider if in the yellow zone for 48 hours without improvement.  Provided patient with education about the role of exercise in the management of COPD  Advised patient to engage in light exercise as tolerated 3-5 days a week  Provided education about and advised patient to utilize infection prevention strategies to reduce risk of respiratory infection  Patient Goals/Self-Care Activities:  . - counseling provided . - decision-making supported . - depression screen reviewed . - emotional support provided . - verbalization of feelings encouraged Follow Up Plan: Telephone follow up appointment with care management team member scheduled for: 12-15-2020 at 10:30 am   Task: RNCM: Support Psychosocial Response to Chronic Obstructive Pulmonary Disease   Note:   Care Management Activities:    - counseling provided - decision-making supported - depression screen reviewed - emotional support provided - verbalization of feelings encouraged       Problem Identified: Symptom Exacerbation (COPD)     Goal: RNCM: Smoking cessation/Symptom Exacerbation Prevented or Minimized   Priority: Medium  Note:   Current Barriers:  . Unable to independently effectively quit smoking . Does not adhere to provider recommendations re: smoking cessation . Lacks social connections . Does not contact provider office for questions/concerns . Tobacco abuse of 66 years; currently smoking 1 ppd . Previous quit attempts, unsuccessful "several" successful using "a few" . Reports smoking within 30 minutes of waking up .  Reports triggers  to smoke include: illness, stress . Reports motivation to quit smoking includes: knows it will help him manage his chronic conditions more effectively and improve his overall health . On a scale of 1-10, reports MOTIVATION to quit is 8 . On a scale of 1-10, reports CONFIDENCE in quitting is 7 Clinical Goal(s):  [name] Over the next 120 days, patient will work with RN Case Engineer, civil (consulting) and provider towards tobacco cessation  Interventions: . Collaboration with [name], [name], FNP regarding development and update of comprehensive plan of care as evidenced by provider attestation and co-signature . Inter-disciplinary care team collaboration (see longitudinal plan of care) . Evaluation of current treatment plan reviewed . Provided contact information for [place] [facility] (1-800-QUIT-NOW). Patient will outreach this group for support. . Discussed plans with patient for ongoing care management follow up and provided patient with direct contact information for care management team . Provided patient with printed smoking cessation educational materials . Provided contact information for [place] [facility] (1-800-QUIT-NOW). Patient will outreach this group for support. . Evaluation of current treatment plan reviewed Patient Goals/Self-Care Activities . Over the next 120 days, patient will:  - watch tv and talk to friends as a habit during cravings  - verbally commit to reducing tobacco consumption Follow Up Plan: Telephone follow up appointment with care management team member scheduled for: 12-15-2020 at 37 am   Task: Identify and Minimize Risk of COPD Exacerbation  Patient Care Plan: RNCM: Hypertension (Adult)    Problem Identified: RNCM: Hypertension (Hypertension)   Priority: Medium    Goal: RNCM: Hypertension Monitored   Priority: Medium  Note:   Objective:  . Last practice recorded BP readings:  BP Readings from Last 3 Encounters:  09/19/20 (!) 159/67  09/11/20 (!) 147/61   08/13/20 108/62 .   [name] Most recent eGFR/CrCl: No results found for: EGFR  No components found for: CRCL Current Barriers:  [name] Knowledge Deficits related to basic understanding of hypertension pathophysiology and self care management . Knowledge Deficits related to understanding of medications prescribed for management of hypertension . Limited Social Support . Unable to independently HTN . Does not contact provider office for questions/concerns Case Manager Clinical Goal(s):  [name] Over the next 120 days, patient will verbalize understanding of plan for hypertension management . Over the next 120 days, patient will attend all scheduled medical appointments: cardiologist 10-21-2020 . Over the next 120 days, patient will demonstrate improved adherence to prescribed treatment plan for hypertension as evidenced by taking all medications as prescribed, monitoring and recording blood pressure as directed, adhering to low sodium/DASH diet . Over the next 120 days, patient will demonstrate improved health management independence as evidenced by checking blood pressure as directed and notifying PCP if SBP>160 or DBP > 90, taking all medications as prescribe, and adhering to a low sodium diet as discussed. . Over the next 120 days, patient will verbalize basic understanding of hypertension disease process and self health management plan as evidenced by compliance with medications, heart healthy diet, working with CCM team to maintain health and well being. Interventions:  . Collaboration with [name], [name], FNP regarding development and update of comprehensive plan of care as evidenced by provider attestation and co-signature . Inter-disciplinary care team collaboration (see longitudinal plan of care) . Evaluation of current treatment plan related to hypertension self management and patient's adherence to plan as established by provider. . Provided education to patient re: stroke prevention, s/s of heart  attack and stroke, DASH diet, complications of uncontrolled blood pressure . Reviewed medications with patient and discussed importance of compliance . Discussed plans with patient for ongoing care management follow up and provided patient with direct contact information for care management team . Advised patient, providing education and rationale, to monitor blood pressure daily and record, calling PCP for findings outside established parameters.  . Reviewed scheduled/upcoming provider appointments including: 10-21-2020 Patient Goals/Self-Care Activities . Over the next 120 days, patient will:  - Self administers medications as prescribed Attends all scheduled provider appointments Calls provider office for new concerns, questions, or BP outside discussed parameters Checks BP and records as discussed Follows a low sodium diet/DASH diet - blood pressure equipment and technique reviewed - blood pressure trends reviewed - depression screen reviewed - home or ambulatory blood pressure monitoring encouraged Follow Up Plan: Telephone follow up appointment with care management team member scheduled for: 12-15-2020 at 10:30 am   Task: RNCM: Identify and Monitor Blood Pressure Elevation   Note:   Care Management Activities:    - blood pressure equipment and technique reviewed - blood pressure trends reviewed - depression screen reviewed - home or ambulatory blood pressure monitoring encouraged       Patient Care Plan: RNCM: Diabetes Type 2 (Adult)    Problem Identified: RNCM: Glycemic Management (Diabetes, Type 2)   Priority: Medium    Goal: RNCM: DM-Glycemic Management Optimized   Priority: Medium  Note:   Objective:  Lab Results  Component Value Date   HGBA1C 6.3 (A) 09/11/2020 .   Lab Results  Component Value Date   CREATININE 1.00 05/25/2020   CREATININE 0.79 05/17/2020   CREATININE 0.91 04/02/2020 .   [name] No results found for: EGFR Current Barriers:  [name] Knowledge Deficits  related to basic Diabetes pathophysiology and self care/management . Knowledge Deficits related to medications used for management of diabetes . Limited Social Support . Unable to independently manage DM . Unable to self administer medications as prescribed . Lacks social connections . Does not contact provider office for questions/concerns Case Manager Clinical Goal(s):  [name] Collaboration with [name], [name], FNP regarding development and update of comprehensive plan of care as evidenced by provider attestation and co-signature . Inter-disciplinary care team collaboration (see longitudinal plan of care) . Over the next 120 days, patient will demonstrate improved adherence to prescribed treatment plan for diabetes self care/management as evidenced by:  . daily monitoring and recording of CBG  . adherence to ADA/ carb modified diet . adherence to prescribed medication regimen Interventions:  . Provided education to patient about basic DM disease process . Discussed plans with patient for ongoing care management follow up and provided patient with direct contact information for care management team . Provided patient with written educational materials related to hypo and hyperglycemia and importance of correct treatment . Advised patient, providing education and rationale, to check cbg BID and record, calling pcp for findings outside established parameters.   . Review of patient status, including review of consultants reports, relevant laboratory and other test results, and medications completed. Patient Goals/Self-Care Activities . Over the next 120 days, patient will:  - UNABLE to independently DM Self administers oral medications as prescribed Attends all scheduled provider appointments Checks blood sugars as prescribed and utilize hyper and hypoglycemia protocol as needed Adheres to prescribed ADA/carb modified - barriers to adherence to treatment plan identified - blood glucose  monitoring encouraged - blood glucose readings reviewed - resources required to improve adherence to care  identified - self-awareness of signs/symptoms of hypo or hyperglycemia encouraged - use of blood glucose monitoring log promoted Follow Up Plan: Telephone follow up appointment with care management team member scheduled for: 12-15-2020 at 10:30 am   Task: RNCM: Alleviate Barriers to Glycemic Management   Note:   Care Management Activities:    - barriers to adherence to treatment plan identified - blood glucose monitoring encouraged - blood glucose readings reviewed - resources required to improve adherence to care identified - self-awareness of signs/symptoms of hypo or hyperglycemia encouraged - use of blood glucose monitoring log promoted         Patient verbalizes understanding of instructions provided today.  Telephone follow up appointment with care management team member scheduled for: 12-15-2020 at 10:30 am  [place], MSN, [place] Mobile: [phone]

## 2020-10-20 NOTE — Chronic Care Management (AMB) (Signed)
Chronic Care Management   CCM RN Visit Note  10/20/2020 Name: Nathaniel Little MRN: [id number] DOB: 06-02-1946  Subjective: Nathaniel Little is a 75 y.o. year old male who is a primary care patient of Lorine Bears, [name], FNP. The care management team was consulted for assistance with disease management and care coordination needs.    Engaged with patient by telephone for follow up visit in response to provider referral for case management and/or care coordination services.   Consent to Services:  The patient is engaged with CCM team  Patient agreed to services and verbal consent obtained.   Assessment: Review of patient past medical history, allergies, medications, health status, including review of consultants reports, laboratory and other test data, was performed as part of comprehensive evaluation and provision of chronic care management services.   SDOH (Social Determinants of Health) assessments and interventions performed:    CCM Care Plan  Allergies  Allergen Reactions   Codeine Itching    Outpatient Encounter Medications as of 10/20/2020  Medication Sig Note   albuterol (PROVENTIL) (2.5 MG/3ML) 0.083% nebulizer solution Take 2.5 mg by nebulization every 6 (six) hours as needed for wheezing or shortness of breath.    albuterol (VENTOLIN HFA) 108 (90 Base) MCG/ACT inhaler INHALE 2 PUFFS INTO THE LUNGS EVERY 6 HOURS AS NEEDED FOR WHEEZING OR SHORTNESS OF BREATH    aspirin EC 81 MG tablet Take 81 mg by mouth daily.    atorvastatin (LIPITOR) 80 MG tablet Take 80 mg by mouth daily.     budesonide-formoterol (SYMBICORT) 80-4.5 MCG/ACT inhaler Inhale 2 puffs into the lungs 2 (two) times daily.    carvedilol (COREG) 3.125 MG tablet Take 1 tablet (3.125 mg total) by mouth 2 (two) times daily.    cloNIDine (CATAPRES) 0.1 MG tablet Take 0.1 mg by mouth 2 (two) times daily.    clopidogrel (PLAVIX) 75 MG tablet TAKE 1 TABLET BY MOUTH ONCE DAILY    fluticasone (FLONASE) 50 MCG/ACT nasal  spray Place 1 spray into both nostrils in the morning and at bedtime.    gabapentin (NEURONTIN) 100 MG capsule Take 1-2 capsules (100-200 mg total) by mouth at bedtime.    hydrochlorothiazide (HYDRODIURIL) 25 MG tablet Take 1 tablet (25 mg total) by mouth daily.    insulin glargine (LANTUS) 100 unit/mL SOPN Inject 20 Units into the skin at bedtime. 03/28/2020: Pt will be using 10 units prior to procedure.   metFORMIN (GLUCOPHAGE) 500 MG tablet Take 1,000 mg by mouth 2 (two) times daily.     pantoprazole (PROTONIX) 40 MG tablet Take 1 tablet (40 mg total) by mouth 2 (two) times daily.    SPIRIVA HANDIHALER 18 MCG inhalation capsule INHALE CONTENTS OF 1 CAPSULE ONCE DAILY (Patient taking differently: Place 18 mcg into inhaler and inhale daily.)    traZODone (DESYREL) 100 MG tablet Take by mouth.    No facility-administered encounter medications on file as of 10/20/2020.    Patient Active Problem List   Diagnosis Date Noted   Hip pain 09/11/2020   Uncontrolled type 2 diabetes mellitus with hyperglycemia ([place]) 06/13/2020   DM type 2 with diabetic peripheral neuropathy ([place]) 06/13/2020   Post-operative pain 06/13/2020   Tremor 06/13/2020   Physical deconditioning 06/13/2020   Dark stools 04/17/2020   BRBPR (bright red blood per rectum) 04/17/2020   S/P TAVR (transcatheter aortic valve replacement) 04/01/2020   (HFpEF) heart failure with preserved ejection fraction (HCC)    Diabetes mellitus without complication (HCC)    Incidental pulmonary  nodule, greater than or equal to 49mm 03/11/2020   Duodenitis 02/18/2020   Diabetes ([place]) 12/06/2019   Erectile dysfunction due to arterial insufficiency 09/24/2019   Lumbar degenerative disc disease 06/25/2019   Sacroiliac joint dysfunction of both sides 06/25/2019   Chronic heart failure with preserved ejection fraction ([place]) 01/19/2019   Tobacco abuse 01/19/2019   Severe aortic stenosis    Hyperlipidemia 07/20/2018   IPMN  (intraductal papillary mucinous neoplasm) 12/24/2017   Claudication in peripheral vascular disease ([place]) 08/22/2017   Pancreatic mass 08/20/2017   Aortic atherosclerosis ([place]) 07/25/2017   BMI 38.0-38.9,adult 02/09/2017   Arthritis pain of hip 01/07/2017   Chronic obstructive pulmonary disease ([place]) 01/07/2017   GERD (gastroesophageal reflux disease) 01/07/2017   Essential hypertension 01/07/2017   Depression 01/06/2017   Sleep apnea with use of continuous positive airway pressure (CPAP) 01/06/2017   Personal history of prostate cancer 03/04/2013    Conditions to be addressed/monitored:HTN, COPD and DMII  Care Plan : RNCM: COPD (Adult)  Updates made by Vanita Ingles since 10/20/2020 12:00 AM    Problem: RNCM: Psychological Adjustment to Diagnosis (COPD)   Priority: Medium    Goal: RNCM: Management of COPD   Priority: Medium  Note:   Current Barriers:   Knowledge deficits related to basic understanding of COPD disease process  Knowledge deficits related to basic COPD self care/management  Knowledge deficit related to basic understanding of how to use inhalers and how inhaled medications work  Knowledge deficit related to importance of energy conservation  Limited Social Support  Unable to independently manage COPD  Lacks social connections  Does not contact provider office for questions/concerns  Case Manager Clinical Goal(s):  Over the next 120 days patient will report using inhalers as prescribed including rinsing mouth after use  Over the next 120 days patient will report utilizing pursed lip breathing for shortness of breath  Over the next 120 days, patient will be able to verbalize understanding of COPD action plan and when to seek appropriate levels of medical care  Over the next 120 days, patient will engage in lite exercise as tolerated to build/regain stamina and strength and reduce shortness of breath through activity tolerance  Over the next  120 days, patient will verbalize basic understanding of COPD disease process and self care activities  Over the next 120 days, patient will not be hospitalized for COPD exacerbation  Interventions:   Collaboration with [name], [name], FNP regarding development and update of comprehensive plan of care as evidenced by provider attestation and co-signature  Inter-disciplinary care team collaboration (see longitudinal plan of care)  Provided patient with basic written and verbal COPD education on self care/management/and exacerbation prevention   Provided patient with COPD action plan and reinforced importance of daily self assessment  Discussed Pulmonary Rehab and offered to assist with referral placement  Provided written and verbal instructions on pursed lip breathing and utilized returned demonstration as teach back  Provided instruction about proper use of medications used for management of COPD including inhalers  Advised patient to self assesses COPD action plan zone and make appointment with provider if in the yellow zone for 48 hours without improvement.  Provided patient with education about the role of exercise in the management of COPD  Advised patient to engage in light exercise as tolerated 3-5 days a week  Provided education about and advised patient to utilize infection prevention strategies to reduce risk of respiratory infection  Patient Goals/Self-Care Activities:   - counseling provided  -  decision-making supported  - depression screen reviewed  - emotional support provided  - verbalization of feelings encouraged Follow Up Plan: Telephone follow up appointment with care management team member scheduled for: 12-15-2020 at 10:30 am   Task: RNCM: Support Psychosocial Response to Chronic Obstructive Pulmonary Disease   Note:   Care Management Activities:    - counseling provided - decision-making supported - depression screen reviewed - emotional support  provided - verbalization of feelings encouraged       Problem: Symptom Exacerbation (COPD)     Goal: RNCM: Smoking cessation/Symptom Exacerbation Prevented or Minimized   Priority: Medium  Note:   Current Barriers:   Unable to independently effectively quit smoking  Does not adhere to provider recommendations re: smoking cessation  Lacks social connections  Does not contact provider office for questions/concerns  Tobacco abuse of 66 years; currently smoking 1 ppd  Previous quit attempts, unsuccessful "several" successful using "a few"  Reports smoking within 30 minutes of waking up  Reports triggers to smoke include: illness, stress  Reports motivation to quit smoking includes: knows it will help him manage his chronic conditions more effectively and improve his overall health  On a scale of 1-10, reports MOTIVATION to quit is 8  On a scale of 1-10, reports CONFIDENCE in quitting is 7 Clinical Goal(s):   Over the next 120 days, patient will work with RN Case Engineer, civil (consulting) and provider towards tobacco cessation  Interventions:  Collaboration with [name], [name], FNP regarding development and update of comprehensive plan of care as evidenced by provider attestation and co-signature  Inter-disciplinary care team collaboration (see longitudinal plan of care)  Evaluation of current treatment plan reviewed  Provided contact information for [place] [facility] (1-800-QUIT-NOW). Patient will outreach this group for support.  Discussed plans with patient for ongoing care management follow up and provided patient with direct contact information for care management team  Provided patient with printed smoking cessation educational materials  Provided contact information for [place] [facility] (1-800-QUIT-NOW). Patient will outreach this group for support.  Evaluation of current treatment plan reviewed Patient Goals/Self-Care Activities  Over the next 120 days,  patient will:  - watch tv and talk to friends as a habit during cravings  - verbally commit to reducing tobacco consumption Follow Up Plan: Telephone follow up appointment with care management team member scheduled for: 12-15-2020 at 27 am   Care Plan : RNCM: Hypertension (Adult)  Updates made by Vanita Ingles since 10/20/2020 12:00 AM    Problem: RNCM: Hypertension (Hypertension)   Priority: Medium    Goal: RNCM: Hypertension Monitored   Priority: Medium  Note:   Objective:   Last practice recorded BP readings:  BP Readings from Last 3 Encounters:  09/19/20 (!) 159/67  09/11/20 (!) 147/61  08/13/20 108/62     Most recent eGFR/CrCl: No results found for: EGFR  No components found for: CRCL Current Barriers:   Knowledge Deficits related to basic understanding of hypertension pathophysiology and self care management  Knowledge Deficits related to understanding of medications prescribed for management of hypertension  Limited Social Support  Unable to independently HTN  Does not contact provider office for questions/concerns Case Manager Clinical Goal(s):   Over the next 120 days, patient will verbalize understanding of plan for hypertension management  Over the next 120 days, patient will attend all scheduled medical appointments: cardiologist 10-21-2020  Over the next 120 days, patient will demonstrate improved adherence to prescribed treatment plan for  hypertension as evidenced by taking all medications as prescribed, monitoring and recording blood pressure as directed, adhering to low sodium/DASH diet  Over the next 120 days, patient will demonstrate improved health management independence as evidenced by checking blood pressure as directed and notifying PCP if SBP>160 or DBP > 90, taking all medications as prescribe, and adhering to a low sodium diet as discussed.  Over the next 120 days, patient will verbalize basic understanding of hypertension disease process and  self health management plan as evidenced by compliance with medications, heart healthy diet, working with CCM team to maintain health and well being. Interventions:   Collaboration with [name], [name], FNP regarding development and update of comprehensive plan of care as evidenced by provider attestation and co-signature  Inter-disciplinary care team collaboration (see longitudinal plan of care)  Evaluation of current treatment plan related to hypertension self management and patient's adherence to plan as established by provider.  Provided education to patient re: stroke prevention, s/s of heart attack and stroke, DASH diet, complications of uncontrolled blood pressure  Reviewed medications with patient and discussed importance of compliance  Discussed plans with patient for ongoing care management follow up and provided patient with direct contact information for care management team  Advised patient, providing education and rationale, to monitor blood pressure daily and record, calling PCP for findings outside established parameters.   Reviewed scheduled/upcoming provider appointments including: 10-21-2020 Patient Goals/Self-Care Activities  Over the next 120 days, patient will:  - Self administers medications as prescribed Attends all scheduled provider appointments Calls provider office for new concerns, questions, or BP outside discussed parameters Checks BP and records as discussed Follows a low sodium diet/DASH diet - blood pressure equipment and technique reviewed - blood pressure trends reviewed - depression screen reviewed - home or ambulatory blood pressure monitoring encouraged Follow Up Plan: Telephone follow up appointment with care management team member scheduled for: 12-15-2020 at 10:30 am   Task: RNCM: Identify and Monitor Blood Pressure Elevation   Note:   Care Management Activities:    - blood pressure equipment and technique reviewed - blood pressure trends  reviewed - depression screen reviewed - home or ambulatory blood pressure monitoring encouraged       Care Plan : RNCM: Diabetes Type 2 (Adult)  Updates made by Vanita Ingles since 10/20/2020 12:00 AM    Problem: RNCM: Glycemic Management (Diabetes, Type 2)   Priority: Medium    Goal: RNCM: DM-Glycemic Management Optimized   Priority: Medium  Note:   Objective:  Lab Results  Component Value Date   HGBA1C 6.3 (A) 09/11/2020    Lab Results  Component Value Date   CREATININE 1.00 05/25/2020   CREATININE 0.79 05/17/2020   CREATININE 0.91 04/02/2020     No results found for: EGFR Current Barriers:   Knowledge Deficits related to basic Diabetes pathophysiology and self care/management  Knowledge Deficits related to medications used for management of diabetes  Limited Social Support  Unable to independently manage DM  Unable to self administer medications as prescribed  Lacks social connections  Does not contact provider office for questions/concerns Case Manager Clinical Goal(s):   Collaboration with [name], [name], FNP regarding development and update of comprehensive plan of care as evidenced by provider attestation and co-signature  Inter-disciplinary care team collaboration (see longitudinal plan of care)  Over the next 120 days, patient will demonstrate improved adherence to prescribed treatment plan for diabetes self care/management as evidenced by:   daily monitoring and recording  of CBG   adherence to ADA/ carb modified diet  adherence to prescribed medication regimen Interventions:   Provided education to patient about basic DM disease process  Discussed plans with patient for ongoing care management follow up and provided patient with direct contact information for care management team  Provided patient with written educational materials related to hypo and hyperglycemia and importance of correct treatment  Advised patient, providing education  and rationale, to check cbg BID and record, calling pcp for findings outside established parameters.    Review of patient status, including review of consultants reports, relevant laboratory and other test results, and medications completed. Patient Goals/Self-Care Activities  Over the next 120 days, patient will:  - UNABLE to independently DM Self administers oral medications as prescribed Attends all scheduled provider appointments Checks blood sugars as prescribed and utilize hyper and hypoglycemia protocol as needed Adheres to prescribed ADA/carb modified - barriers to adherence to treatment plan identified - blood glucose monitoring encouraged - blood glucose readings reviewed - resources required to improve adherence to care identified - self-awareness of signs/symptoms of hypo or hyperglycemia encouraged - use of blood glucose monitoring log promoted Follow Up Plan: Telephone follow up appointment with care management team member scheduled for: 12-15-2020 at 10:30 am   Task: RNCM: Alleviate Barriers to Glycemic Management   Note:   Care Management Activities:    - barriers to adherence to treatment plan identified - blood glucose monitoring encouraged - blood glucose readings reviewed - resources required to improve adherence to care identified - self-awareness of signs/symptoms of hypo or hyperglycemia encouraged - use of blood glucose monitoring log promoted         Plan:Telephone follow up appointment with care management team member scheduled for:  12-15-2020 at 10:30 am  [place], MSN, [place] Mobile: [phone]

## 2020-10-21 ENCOUNTER — Other Ambulatory Visit: Payer: Self-pay | Admitting: Thoracic Surgery (Cardiothoracic Vascular Surgery)

## 2020-10-21 ENCOUNTER — Other Ambulatory Visit: Payer: Self-pay

## 2020-10-21 ENCOUNTER — Encounter: Payer: Self-pay | Admitting: Thoracic Surgery (Cardiothoracic Vascular Surgery)

## 2020-10-21 ENCOUNTER — Institutional Professional Consult (permissible substitution): Payer: Medicare Other | Admitting: Thoracic Surgery (Cardiothoracic Vascular Surgery)

## 2020-10-21 VITALS — BP 170/77 | HR 72 | Temp 97.6°F | Resp 20 | Ht 69.0 in | Wt 207.0 lb

## 2020-10-21 DIAGNOSIS — R911 Solitary pulmonary nodule: Secondary | ICD-10-CM

## 2020-10-21 DIAGNOSIS — R6889 Other general symptoms and signs: Secondary | ICD-10-CM | POA: Diagnosis not present

## 2020-10-21 NOTE — Progress Notes (Signed)
PCP is [name], [name], FNP Referring Provider is [name], [name], FNP  Chief Complaint  Patient presents with  . Lung Lesion    Surgical consult, Chest CT 09/16/20    HPI: [name] is sent for consultation regarding a nodule in the left lower lobe.  Nathaniel Little is a 75 year old man with a complex medical history including a long history of tobacco abuse, COPD, aortic stenosis status post TAVR, IPMN status post Whipple, choledocholithiasis, pancreatitis, obesity, sleep apnea, arthritis, type 2 diabetes, lymphedema, and a left lower lobe lung nodule.  He started smoking at age 71 and smoked up to 4 packs/day in the past.  He says he is currently smoking 3 cigarettes daily.  He was found to have a left lower lobe lung nodule back in June 2021 a CT done prior to his TAVR.  On follow-up in August it was unchanged.  More recently in December it was measured at 1.5 cm.  He did have a PET/CT back in June when the nodule was first discovered.  There was mild uptake with an SUV of 2.  There also was a nodule in the parotid gland with an SUV of 5.  He says that has not been worked up.  He is a poor historian.  He has arthritis in his hips.  He walks with a cane.  He often uses a motorized wheelchair.  He has a productive cough.  He is not using CPAP because he started can cause cancer.  He has had multiple episodes of bronchitis.  He has occasional wheezing.  He has some shortness of breath which varies from day-to-day.  No chest pain, pressure, or tightness.  His appetite has been variable.  He has lost about 60 pounds over the past 6 months.  Zubrod Score: At the time of surgery this patient's most appropriate activity status/level should be described as: []     0    Normal activity, no symptoms []     1    Restricted in physical strenuous activity but ambulatory, able to do out light work [x]     2    Ambulatory and capable of self care, unable to do work activities, up and about >50 % of waking hours                               []     3    Only limited self care, in bed greater than 50% of waking hours []     4    Completely disabled, no self care, confined to bed or chair []     5    Moribund  Past Medical History:  Diagnosis Date  . (HFpEF) heart failure with preserved ejection fraction ([place])    a. 08/2017 Echo: EF 60-65%, Gr1 DD.  [name] Arthritis   . COPD (chronic obstructive pulmonary disease) ([place])   . Depression   . Diabetes mellitus without complication ([place])   . Hypertension   . Incidental pulmonary nodule, greater than or equal to 64mm 03/11/2020   Left lower lobe - suspicious for bronchogenic neoplasm  . Lymphedema   . Morbid obesity ([place])   . Prostate cancer ([place]) 03/2013   ? PANCREATIC  . S/P TAVR (transcatheter aortic valve replacement) 04/01/2020   s/p TAVR with a 29 mm Edwards Sapien 3 THV via the TF approach by Drs Buena Irish & Roxy Manns  . Severe aortic stenosis   . Sleep apnea with  use of continuous positive airway pressure (CPAP)   . Tobacco abuse   . Tremor    HEAD    Past Surgical History:  Procedure Laterality Date  . ADENOIDECTOMY    . APPENDECTOMY    . CARDIAC CATHETERIZATION    . CATARACT EXTRACTION W/PHACO Left 11/28/2018   Procedure: CATARACT EXTRACTION PHACO AND INTRAOCULAR LENS PLACEMENT (IOC) LEFT, DIABETIC;  Surgeon: Birder Robson, MD;  Location: [facility] ORS;  Service: Ophthalmology;  Laterality: Left;  [name]  00:56 CDE 9.44 Fluid pack lot # [id number]  . EUS N/A 10/13/2017   Procedure: FULL UPPER ENDOSCOPIC ULTRASOUND (EUS) RADIAL;  Surgeon: Holly Bodily, MD;  Location: [facility] ENDOSCOPY;  Service: Gastroenterology;  Laterality: N/A;  . PANCREATICODUODENECTOMY  07/23/2018  . RIGHT/LEFT HEART CATH AND CORONARY ANGIOGRAPHY N/A 02/21/2020   Procedure: RIGHT/LEFT HEART CATH AND CORONARY ANGIOGRAPHY;  Surgeon: Minna Merritts, MD;  Location: [place] CV LAB;  Service: Cardiovascular;  Laterality: N/A;  . TEE WITHOUT CARDIOVERSION N/A 04/01/2020    Procedure: TRANSESOPHAGEAL ECHOCARDIOGRAM (TEE);  Surgeon: Burnell Blanks, MD;  Location: [place] CV LAB;  Service: Open Heart Surgery;  Laterality: N/A;  . TONSILLECTOMY    . TRANSCATHETER AORTIC VALVE REPLACEMENT, TRANSFEMORAL N/A 04/01/2020   Procedure: TRANSCATHETER AORTIC VALVE REPLACEMENT, TRANSFEMORAL;  Surgeon: Burnell Blanks, MD;  Location: [place] CV LAB;  Service: Open Heart Surgery;  Laterality: N/A;  . wipple      Family History  Problem Relation Age of Onset  . Breast cancer Mother   . Cancer Father        Black Lung  . Cerebral palsy Brother   . Tuberculosis Paternal Uncle   . Prostate cancer Neg Hx   . Kidney cancer Neg Hx   . Bladder Cancer Neg Hx     Social History Social History   Tobacco Use  . Smoking status: Current Some Day Smoker    Packs/day: 4.00    Years: 68.00    Pack years: 272.00    Types: Cigarettes  . Smokeless tobacco: Former Systems developer    Types: Chew, [place] date: 2000  . Tobacco comment: 3 cigs day/ 08/13/2020  Vaping Use  . Vaping Use: Never used  Substance Use Topics  . Alcohol use: Not Currently    Alcohol/week: 0.0 - 1.0 standard drinks    Comment: quit 40years ago  . Drug use: No    Current Outpatient Medications  Medication Sig Dispense Refill  . albuterol (PROVENTIL) (2.5 MG/3ML) 0.083% nebulizer solution Take 2.5 mg by nebulization every 6 (six) hours as needed for wheezing or shortness of breath.    [name] albuterol (VENTOLIN HFA) 108 (90 Base) MCG/ACT inhaler INHALE 2 PUFFS INTO THE LUNGS EVERY 6 HOURS AS NEEDED FOR WHEEZING OR SHORTNESS OF BREATH 8.5 g 0  . aspirin EC 81 MG tablet Take 81 mg by mouth daily.    [name] atorvastatin (LIPITOR) 80 MG tablet Take 80 mg by mouth daily.     . budesonide-formoterol (SYMBICORT) 80-4.5 MCG/ACT inhaler Inhale 2 puffs into the lungs 2 (two) times daily. 10.2 g 3  . cloNIDine (CATAPRES) 0.1 MG tablet Take 0.1 mg by mouth 2 (two) times daily.    . clopidogrel (PLAVIX) 75  MG tablet TAKE 1 TABLET BY MOUTH ONCE DAILY 90 tablet 2  . fluticasone (FLONASE) 50 MCG/ACT nasal spray Place 1 spray into both nostrils in the morning and at bedtime. 15.8 mL 3  . gabapentin (NEURONTIN) 100 MG capsule  Take 1-2 capsules (100-200 mg total) by mouth at bedtime. 180 capsule 1  . hydrochlorothiazide (HYDRODIURIL) 25 MG tablet Take 1 tablet (25 mg total) by mouth daily. 30 tablet 5  . insulin glargine (LANTUS) 100 unit/mL SOPN Inject 20 Units into the skin at bedtime.    . metFORMIN (GLUCOPHAGE) 500 MG tablet Take 1,000 mg by mouth 2 (two) times daily.     . pantoprazole (PROTONIX) 40 MG tablet Take 1 tablet (40 mg total) by mouth 2 (two) times daily. 60 tablet 11  . SPIRIVA HANDIHALER 18 MCG inhalation capsule INHALE CONTENTS OF 1 CAPSULE ONCE DAILY (Patient taking differently: Place 18 mcg into inhaler and inhale daily.) 30 capsule 5  . traZODone (DESYREL) 100 MG tablet Take by mouth.    . carvedilol (COREG) 3.125 MG tablet Take 1 tablet (3.125 mg total) by mouth 2 (two) times daily. 60 tablet 5   No current facility-administered medications for this visit.    Allergies  Allergen Reactions  . Codeine Itching    Review of Systems  Constitutional: Positive for appetite change and unexpected weight change. Negative for fatigue.  HENT: Negative for trouble swallowing and voice change.   Respiratory: Positive for apnea, cough, shortness of breath (With exertion, varies) and wheezing. Negative for chest tightness.   Cardiovascular: Positive for leg swelling. Negative for chest pain.  Gastrointestinal: Positive for blood in stool (Intermittent). Negative for abdominal distention.  Genitourinary: Negative for dysuria.  Musculoskeletal: Positive for arthralgias, gait problem and joint swelling.       Leg cramps  Neurological: Positive for tremors. Negative for syncope and weakness.  Hematological: Negative for adenopathy. Bruises/bleeds easily.  All other systems reviewed and are  negative.   BP (!) 170/77   Pulse 72   Temp 97.6 F (36.4 C) (Skin)   Resp 20   Ht 5\' 9"  (1.753 m)   Wt 207 lb (93.9 kg)   SpO2 96% Comment: RA  BMI 30.57 kg/m  Physical Exam Vitals reviewed.  Constitutional:      General: He is not in acute distress.    Appearance: Normal appearance.  HENT:     Head: Normocephalic and atraumatic.  Eyes:     General: No scleral icterus.    Extraocular Movements: Extraocular movements intact.  Cardiovascular:     Rate and Rhythm: Normal rate and regular rhythm.     Heart sounds: Murmur (Faint systolic right upper sternal border) heard.    Pulmonary:     Effort: Pulmonary effort is normal. No respiratory distress.     Breath sounds: No wheezing or rales.  Abdominal:     General: There is no distension.     Palpations: Abdomen is soft.     Tenderness: There is no abdominal tenderness.  Musculoskeletal:     Cervical back: Neck supple.     Right lower leg: Edema (1+) present.     Left lower leg: Edema (1+) present.  Lymphadenopathy:     Cervical: No cervical adenopathy.  Skin:    General: Skin is warm and dry.  Neurological:     General: No focal deficit present.     Mental Status: He is alert and oriented to person, place, and time.     Cranial Nerves: No cranial nerve deficit.     Motor: No weakness.    Diagnostic Tests: NUCLEAR MEDICINE PET SKULL BASE TO THIGH  TECHNIQUE: 12.3 mCi F-18 FDG was injected intravenously. Full-ring PET imaging was performed from the skull base to  thigh after the radiotracer. CT data was obtained and used for attenuation correction and anatomic localization.  Fasting blood glucose: 143 mg/dl  COMPARISON:  CTA chest abdomen pelvis dated 03/11/2020  FINDINGS: Mediastinal blood pool activity: SUV max 2.3  Liver activity: SUV max NA  NECK: No hypermetabolic cervical lymphadenopathy.  15 mm lesion in the left posterior parotid gland (series 3/image 39), max SUV 5.1, raising concern for  primary parotid neoplasm, benign or malignant. Benign lesion such as Warthin's tumor is common, but this is technically indeterminate.  Incidental CT findings: none  CHEST: 14 x 9 mm lobulated nodule in the medial left lower lobe (series 3/image 108), demonstrating only mild hypermetabolism, max SUV 2.0. However, this raises concern for primary bronchogenic neoplasm.  No hypermetabolic thoracic lymphadenopathy.  Incidental CT findings: Atherosclerotic calcifications of the aortic arch. Mild three-vessel coronary atherosclerosis.  ABDOMEN/PELVIS: No abnormal hypermetabolism in the liver, spleen, pancreas, or adrenal glands.  No hypermetabolic abdominopelvic lymphadenopathy.  Postsurgical changes related to Whipple procedure.  Biliary stent.  Mild rectal wall thickening (series 3/image 255), equivocal. Mild rectal hypermetabolism, max SUV 7.3, favored to be physiologic.  Brachytherapy seeds in the prostate.  Incidental CT findings: Atherosclerotic calcifications of the aortic arch. Moderate fat containing left inguinal hernia.  SKELETON: No focal hypermetabolic activity to suggest skeletal metastasis.  Incidental CT findings: Degenerative changes of the visualized thoracolumbar spine.  IMPRESSION: 14 mm left lower lobe nodule demonstrates mild hypermetabolism, raising concern for primary bronchogenic neoplasm.  No findings suspicious for metastatic disease.  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  Additional postsurgical and ancillary findings as above.   Electronically Signed   By: Julian Hy M.D.   On: 03/17/2020 13:49 CT CHEST WITHOUT CONTRAST  TECHNIQUE: Multidetector CT imaging of the chest was performed following the standard protocol without IV contrast.  COMPARISON:  05/25/2020  FINDINGS: Cardiovascular: Mild cardiac  enlargement. No pericardial effusion. Status post aortic valvuloplasty. Aortic atherosclerosis. Coronary artery calcifications.  Mediastinum/Nodes: No enlarged mediastinal or axillary lymph nodes. Thyroid gland, trachea, and esophagus demonstrate no significant findings.  Lungs/Pleura: Mild centrilobular emphysema. Nodule within the medial left lower lobe measures 1.5 cm, image 77/3. No new lung nodules. The interval resolution of bibasilar atelectasis.  Upper Abdomen: No acute abnormality. Status post choledocho jejunostomy. Aortic atherosclerosis.  Musculoskeletal: Bones appear diffusely osteopenic. Multilevel degenerative disc disease identified within the thoracic spine.  IMPRESSION: 1. Interval resolution of bibasilar atelectasis. 2. Stable appearance of left lower lobe lung nodule measuring 1.5 cm. Cannot exclude primary bronchogenic carcinoma. 3. Emphysema and aortic atherosclerosis. 4. Coronary artery calcifications noted.  Aortic Atherosclerosis (ICD10-I70.0) and Emphysema (ICD10-J43.9).   Electronically Signed   By: Kerby Moors M.D.   On: 09/16/2020 13:05 I personally reviewed the CT and PET/CT images.  They reveal a 1.3 to 1.5 cm nodule in the superior segment of the left lower lobe.  No evidence of regional or distant metastatic disease.  Impression: Nathaniel Little is a 75 year old man with a complex medical history including a long history of tobacco abuse, COPD, aortic stenosis status post TAVR, IPMN status post Whipple, choledocholithiasis, pancreatitis, obesity, sleep apnea, arthritis, type 2 diabetes, lymphedema, and a left lower lobe lung nodule.    Given his greater than 200-pack-year history of smoking, age, and appearance of the nodule, this is most likely a primary bronchogenic carcinoma.  Carcinoid tumors and infectious and inflammatory nodules are also in the differential diagnosis.  He says to be considered a primary bronchogenic carcinoma unless  it can be proven otherwise.  I discussed different potential treatment options with Mr. Mitton is that we will have a significant impact on how we approach his diagnosis.  There is no definite contraindication to surgery.  However, he is a relatively high risk patient given his multiple comorbidities.  Another reasonable option would be to treat with stereotactic radiation.  We discussed the relative advantages and disadvantages of surgery versus radiation.  If he were to select radiation then the next step would be to do a navigational bronchoscopy.  Dr. Patsey Berthold could do that in [place], which would be easier for him.    If he wishes to pursue surgery then there really is no reason to do a bronchoscopy.  We will proceed directly to surgical resection.  This looks like it could be managed with a superior segmentectomy.  He would need pulmonary function testing to ensure that he could tolerate a resection.  I did describe the proposed operation to him.  That would be a robotic assisted left lower lobe superior segmentectomy.  I informed him of the general nature of the procedure including the need for general anesthesia, the incisions to be used, the use of a drainage tube postoperatively, the expected hospital stay, and the overall recovery.  We discussed the indications, risk, benefits, and alternatives.  He understands the risks include, but not limited to death, MI, DVT, PE, bleeding, possible need for transfusion, infection, prolonged air leak, cardiac arrhythmias, as well as possibility of other unforeseeable complications.  He will think over his options and discuss them with Dr. Patsey Berthold when he sees her next week.    If he decides he would like to proceed with surgery.  I will see him back in 2 weeks and we can review his pulmonary function testing and formalize a plan.    Plan: Pulmonary function testing with and without bronchodilators Follow-up with Dr. Patsey Berthold next week Return in 2  weeks to further discuss possible resection  I spent over 45 minutes in review of records, images, and in consultation with Mr. Micheletti today. Melrose Nakayama, MD [facility] [phone]

## 2020-10-27 ENCOUNTER — Other Ambulatory Visit
Admission: RE | Admit: 2020-10-27 | Discharge: 2020-10-27 | Disposition: A | Discharge status: Home / Self Care | Payer: Medicare Other | Source: Ambulatory Visit | Attending: Thoracic Surgery (Cardiothoracic Vascular Surgery) | Admitting: Thoracic Surgery (Cardiothoracic Vascular Surgery)

## 2020-10-27 ENCOUNTER — Other Ambulatory Visit: Payer: Self-pay

## 2020-10-27 DIAGNOSIS — Z20822 Contact with and (suspected) exposure to covid-19: Secondary | ICD-10-CM | POA: Insufficient documentation

## 2020-10-27 DIAGNOSIS — Z01812 Encounter for preprocedural laboratory examination: Secondary | ICD-10-CM | POA: Diagnosis not present

## 2020-10-27 LAB — SARS CORONAVIRUS 2 (TAT 6-24 HRS): SARS Coronavirus 2: NEGATIVE

## 2020-10-28 ENCOUNTER — Ambulatory Visit: Payer: Medicare Other | Admitting: Pulmonary Disease

## 2020-10-28 ENCOUNTER — Ambulatory Visit: Payer: Medicare Other | Attending: Thoracic Surgery (Cardiothoracic Vascular Surgery)

## 2020-10-28 ENCOUNTER — Encounter: Payer: Self-pay | Admitting: Pulmonary Disease

## 2020-10-28 VITALS — BP 150/90 | HR 71 | Temp 97.3°F | Ht 65.0 in | Wt 208.8 lb

## 2020-10-28 DIAGNOSIS — Z574 Occupational exposure to toxic agents in agriculture: Secondary | ICD-10-CM | POA: Insufficient documentation

## 2020-10-28 DIAGNOSIS — J449 Chronic obstructive pulmonary disease, unspecified: Secondary | ICD-10-CM

## 2020-10-28 DIAGNOSIS — R911 Solitary pulmonary nodule: Secondary | ICD-10-CM | POA: Diagnosis not present

## 2020-10-28 DIAGNOSIS — F17210 Nicotine dependence, cigarettes, uncomplicated: Secondary | ICD-10-CM | POA: Insufficient documentation

## 2020-10-28 MED ORDER — BREZTRI AEROSPHERE 160-9-4.8 MCG/ACT IN AERO: 2.0000 | INHALATION_SPRAY | Freq: Two times a day (BID) | RESPIRATORY_TRACT | 0 refills | Status: DC

## 2020-10-28 NOTE — Patient Instructions (Addendum)
We are giving you a trial of Judithann Sauger which is an inhaler that combines your current two inhalers into one.  This will be 2 puffs twice a day, make sure you rinse your mouth well after you use it.  Let us know how you do with the [facility] if you do well we can send the prescription in for you.  I will review your breathing tests when these are available.  I think you are doing the right thing by choosing robotic surgery.   We will see you back in follow-up in 3 months time call sooner should any problems arise

## 2020-10-28 NOTE — Progress Notes (Signed)
Subjective:    Patient ID: Nathaniel Little, male    DOB: 09-Aug-1946, 75 y.o.   MRN: [id number]  HPI 75 year old (3 cigarettes/day, over 100-pack-year history) who presents for follow-up on a lung nodule.  Patient was evaluated for this issue on 13 August 2020, please refer to that note for details.  At that time the patient stated that he was going to relocate to [place] but apparently this has not occurred.  I had recommended that he had this issue followed wherever he would be next.  The patient however, remained in the area.  He then saw oncology on 19 September 2020 for the same issue.  The location of the nodule on the medial left lower lobe and measures 1.5 cm this nodule has had previously mild FDG uptake and a slow-growing carcinoma cannot be excluded.  Patient previously had been evaluated for dyspnea this was found to be due to severe aortic stenosis and he had successful TAVR with a 29 mm Edwards Sapien 3 THV via the TF approach on 04/01/2020 and since then his dyspnea has been markedly improved.  He does have underlying emphysema but notes no wheezing.  He is currently on Symbicort and Spiriva but does note that occasionally skips the Spiriva because he forgets.  At our November visit I had recommended that he consider robotic excision of the lung nodule, but if he decided against this will be happy to do navigational bronchoscopy for diagnosis.  He has been evaluated by Dr. Merilynn Finland on 18 January, and he has opted for robotic excision option.  He is to have PFTs today.  He will reconvene with Dr. Roxan Hockey on 1 February.   Review of Systems A 10 point review of systems was performed and it is as noted above otherwise negative.  Patient Active Problem List   Diagnosis Date Noted  . Hip pain 09/11/2020  . Uncontrolled type 2 diabetes mellitus with hyperglycemia ([place]) 06/13/2020  . DM type 2 with diabetic peripheral neuropathy ([place]) 06/13/2020  . Post-operative pain  06/13/2020  . Tremor 06/13/2020  . Physical deconditioning 06/13/2020  . Dark stools 04/17/2020  . BRBPR (bright red blood per rectum) 04/17/2020  . S/P TAVR (transcatheter aortic valve replacement) 04/01/2020  . (HFpEF) heart failure with preserved ejection fraction ([place])   . Diabetes mellitus without complication ([place])   . Incidental pulmonary nodule, greater than or equal to 67mm 03/11/2020  . Duodenitis 02/18/2020  . Diabetes ([place]) 12/06/2019  . Erectile dysfunction due to arterial insufficiency 09/24/2019  . Lumbar degenerative disc disease 06/25/2019  . Sacroiliac joint dysfunction of both sides 06/25/2019  . Chronic heart failure with preserved ejection fraction ([place]) 01/19/2019  . Tobacco abuse 01/19/2019  . Severe aortic stenosis   . Hyperlipidemia 07/20/2018  . IPMN (intraductal papillary mucinous neoplasm) 12/24/2017  . Claudication in peripheral vascular disease ([place]) 08/22/2017  . Pancreatic mass 08/20/2017  . Aortic atherosclerosis ([place]) 07/25/2017  . BMI 38.0-38.9,adult 02/09/2017  . Arthritis pain of hip 01/07/2017  . Chronic obstructive pulmonary disease ([place]) 01/07/2017  . GERD (gastroesophageal reflux disease) 01/07/2017  . Essential hypertension 01/07/2017  . Depression 01/06/2017  . Sleep apnea with use of continuous positive airway pressure (CPAP) 01/06/2017  . Personal history of prostate cancer 03/04/2013   Allergies  Allergen Reactions  . Codeine Itching    Current Meds  Medication Sig  . albuterol (PROVENTIL) (2.5 MG/3ML) 0.083% nebulizer solution Take 2.5 mg by nebulization every 6 (six) hours as needed for wheezing or  shortness of breath.  [name] albuterol (VENTOLIN HFA) 108 (90 Base) MCG/ACT inhaler INHALE 2 PUFFS INTO THE LUNGS EVERY 6 HOURS AS NEEDED FOR WHEEZING OR SHORTNESS OF BREATH  . aspirin EC 81 MG tablet Take 81 mg by mouth daily.  [name] atorvastatin (LIPITOR) 80 MG tablet Take 80 mg by mouth daily.   . budesonide-formoterol (SYMBICORT) 80-4.5  MCG/ACT inhaler Inhale 2 puffs into the lungs 2 (two) times daily.  . cloNIDine (CATAPRES) 0.1 MG tablet Take 0.1 mg by mouth 2 (two) times daily.  . clopidogrel (PLAVIX) 75 MG tablet TAKE 1 TABLET BY MOUTH ONCE DAILY  . fluticasone (FLONASE) 50 MCG/ACT nasal spray Place 1 spray into both nostrils in the morning and at bedtime.  . gabapentin (NEURONTIN) 100 MG capsule Take 1-2 capsules (100-200 mg total) by mouth at bedtime.  . hydrochlorothiazide (HYDRODIURIL) 25 MG tablet Take 1 tablet (25 mg total) by mouth daily.  . insulin glargine (LANTUS) 100 unit/mL SOPN Inject 20 Units into the skin at bedtime.  . metFORMIN (GLUCOPHAGE) 500 MG tablet Take 1,000 mg by mouth 2 (two) times daily.   . pantoprazole (PROTONIX) 40 MG tablet Take 1 tablet (40 mg total) by mouth 2 (two) times daily.  [name] SPIRIVA HANDIHALER 18 MCG inhalation capsule INHALE CONTENTS OF 1 CAPSULE ONCE DAILY (Patient taking differently: Place 18 mcg into inhaler and inhale daily.)  . traZODone (DESYREL) 100 MG tablet Take by mouth.   Immunization History  Administered Date(s) Administered  . Influenza, High Dose Seasonal PF 08/09/2018  . Influenza,inj,Quad PF,6+ Mos 07/15/2019  . Influenza-Unspecified 07/12/2019, 08/04/2020  . Moderna Sars-Covid-2 Vaccination 11/22/2019, 12/20/2019, 08/26/2020  . PFIZER(Purple Top)SARS-COV-2 Vaccination 10/29/2019  . Pneumococcal Conjugate-13 12/30/2017        Objective:   Physical Exam BP (!) 150/90 (BP Location: Left Arm, Cuff Size: Normal)   Pulse 71   Temp (!) 97.3 F (36.3 C) (Temporal)   Ht 5\' 5"  (1.651 m)   Wt 208 lb 12.8 oz (94.7 kg)   SpO2 96%   BMI 34.75 kg/m   GENERAL: Well-developed gentleman, fully ambulatory in no respiratory distress. HEAD: Normocephalic, atraumatic.  EYES: Pupils equal, round, reactive to light. No scleral icterus.  MOUTH: Nose/mouth/throat not examined due to masking requirements for COVID 19. NECK: Supple. No thyromegaly. No nodules. No JVD.  Trachea midline, no crepitus. PULMONARY: Lungs clear to auscultation bilaterally. CARDIOVASCULAR: S1 and S2. Regular rate and rhythm.  No rubs, murmurs or gallops heard. GASTROINTESTINAL: Protuberant abdomen, soft. MUSCULOSKELETAL: No joint deformity, no clubbing, no edema.  NEUROLOGIC: He has a resting tremor, no overt focal deficits otherwise. Speech is fluent.  No gait disturbance. SKIN: Intact,warm,dry. Limited exam shows no rashes. PSYCH: Mood and behavior normal.  Pulmonary Functions Testing Results: Performed today: FEV1 1.65 L or 56% predicted, FVC 2.50 L or 61% predicted, FEV1/FVC 66%.  Lung volumes were normal.  Diffusion capacity: Diffusion transfer capacity (Kco) normal at 83%.     Assessment & Plan:     ICD-10-CM   1. Incidental pulmonary nodule, greater than or equal to 22mm  R91.1    Discussed navigational bronchoscopy versus robotic excision Patient is leaning towards robotic excision To reconvene with Dr. Gorden Harms on 1 February  2. Stage 2 moderate COPD by GOLD classification ([place])  J44.9    We will give him a trial of Breztri 2 puffs twice a day Stop Spiriva and Symbicort while on Breztri He is to let us know how Judithann Sauger works for him   3.  Tobacco smoker, less than 10 cigarettes per day  F17.210    Patient was counseled regards discontinuation of smoking Total counseling time 3 to 5 minutes   Meds ordered this encounter  Medications  . Budeson-Glycopyrrol-Formoterol (BREZTRI AEROSPHERE) 160-9-4.8 MCG/ACT AERO    Sig: Inhale 2 puffs into the lungs 2 (two) times daily.    Dispense:  5.9 g    Refill:  0    Order Specific Question:   Lot Number?    Answer:   [id number]    Order Specific Question:   Expiration Date?    Answer:   02/01/2022    Order Specific Question:   Manufacturer?    Answer:   [employer] [[id number]]    Order Specific Question:   Quantity    Answer:   2   Discussion:  Patient is leaning towards robotic excision of his lung nodule.  If he for  some reason changes his mind we will be happy to do navigational bronchoscopy for diagnosis.  Pulmonary function testing today shows moderate obstructive defect.  He has not been optimized with regards to his inhalers so we will give him a trial of Breztri 2 puffs twice a day, he understands he is to rinse his mouth well after use.  He was taught the proper use of the inhaler.  We will see him in follow-up in 3 months time however if he decides that he wants to proceed with navigational bronchoscopy he is to call us sooner than that.  Renold Don, MD [facility]   *This note was dictated using voice recognition software/Dragon.  Despite best efforts to proofread, errors can occur which can change the meaning.  Any change was purely unintentional.

## 2020-10-30 DIAGNOSIS — H3500 Unspecified background retinopathy: Secondary | ICD-10-CM | POA: Diagnosis not present

## 2020-10-30 DIAGNOSIS — E1159 Type 2 diabetes mellitus with other circulatory complications: Secondary | ICD-10-CM | POA: Diagnosis not present

## 2020-10-30 DIAGNOSIS — I152 Hypertension secondary to endocrine disorders: Secondary | ICD-10-CM | POA: Diagnosis not present

## 2020-10-30 DIAGNOSIS — E119 Type 2 diabetes mellitus without complications: Secondary | ICD-10-CM | POA: Diagnosis not present

## 2020-10-30 DIAGNOSIS — F17200 Nicotine dependence, unspecified, uncomplicated: Secondary | ICD-10-CM | POA: Diagnosis not present

## 2020-11-04 ENCOUNTER — Encounter: Payer: Self-pay | Admitting: *Deleted

## 2020-11-04 ENCOUNTER — Ambulatory Visit (INDEPENDENT_AMBULATORY_CARE_PROVIDER_SITE_OTHER): Payer: Medicare Other | Admitting: Thoracic Surgery (Cardiothoracic Vascular Surgery)

## 2020-11-04 ENCOUNTER — Other Ambulatory Visit: Payer: Self-pay

## 2020-11-04 ENCOUNTER — Other Ambulatory Visit: Payer: Self-pay | Admitting: *Deleted

## 2020-11-04 VITALS — BP 138/65 | HR 73 | Temp 97.6°F | Resp 20 | Ht 65.0 in | Wt 208.0 lb

## 2020-11-04 DIAGNOSIS — R6889 Other general symptoms and signs: Secondary | ICD-10-CM | POA: Diagnosis not present

## 2020-11-04 DIAGNOSIS — R911 Solitary pulmonary nodule: Secondary | ICD-10-CM | POA: Diagnosis not present

## 2020-11-04 LAB — PULMONARY FUNCTION TEST ARMC ONLY
DL/VA % pred: 83 %
DL/VA: 3.33 ml/min/mmHg/L
DLCO unc % pred: 57 %
DLCO unc: 13.95 ml/min/mmHg
FEF 25-75 Post: 1.03 L/sec
FEF 25-75 Pre: 0.97 L/sec
FEF2575-%Change-Post: 5 %
FEF2575-%Pred-Post: 48 %
FEF2575-%Pred-Pre: 45 %
FEV1-%Change-Post: -1 %
FEV1-%Pred-Post: 55 %
FEV1-%Pred-Pre: 56 %
FEV1-Post: 1.62 L
FEV1-Pre: 1.65 L
FEV1FVC-%Change-Post: -5 %
FEV1FVC-%Pred-Pre: 90 %
FEV6-%Change-Post: 2 %
FEV6-%Pred-Post: 67 %
FEV6-%Pred-Pre: 65 %
FEV6-Post: 2.56 L
FEV6-Pre: 2.5 L
FEV6FVC-%Change-Post: 0 %
FEV6FVC-%Pred-Post: 105 %
FEV6FVC-%Pred-Pre: 106 %
FVC-%Change-Post: 4 %
FVC-%Pred-Post: 64 %
FVC-%Pred-Pre: 61 %
FVC-Post: 2.61 L
Post FEV1/FVC ratio: 62 %
Post FEV6/FVC ratio: 99 %
Pre FEV1/FVC ratio: 66 %
Pre FEV6/FVC Ratio: 100 %
RV % pred: 115 %
RV: 2.88 L
TLC % pred: 80 %
TLC: 5.51 L

## 2020-11-04 NOTE — Patient Instructions (Signed)
Hold Plavix for 5 days prior to surgery.

## 2020-11-04 NOTE — Progress Notes (Signed)
[address]       [place] [address]             [phone]      HPI: Nathaniel Little returns to discuss management of his left lower lobe lung nodule  Nathaniel Little is a 75 year old man with a complex medical history including a long history of heavy tobacco abuse, COPD, aortic stenosis, TAVR, IPMN status post Whipple, choledocholithiasis, pancreatitis, obesity, sleep apnea, arthritis, type 2 diabetes, lymphedema, and a left lower lobe lung nodule.  He has about a 200-pack-year history of smoking with up to 4 packs a day for over 50 years.  He currently is smoking a few cigarettes a day.  He was found to have a lung nodule on a CT that was done prior to his TAVR in June 2021.  A PET/CT showed mild uptake with an SUV of 2.  Follow-up CT in August showed no change.  Repeat CT in December showed it had increased in size to 1.5 cm.  I saw him last week we discussed possible surgical resection.  He had pulmonary function tests now returns to further discuss management of the nodule.  He did see Dr. Patsey Berthold in [place] last week.  Past Medical History:  Diagnosis Date  . (HFpEF) heart failure with preserved ejection fraction ([place])    a. 08/2017 Echo: EF 60-65%, Gr1 DD.  [name] Arthritis   . COPD (chronic obstructive pulmonary disease) ([place])   . Depression   . Diabetes mellitus without complication ([place])   . Hypertension   . Incidental pulmonary nodule, greater than or equal to 63mm 03/11/2020   Left lower lobe - suspicious for bronchogenic neoplasm  . Lymphedema   . Morbid obesity ([place])   . Prostate cancer ([place]) 03/2013   ? PANCREATIC  . S/P TAVR (transcatheter aortic valve replacement) 04/01/2020   s/p TAVR with a 29 mm Edwards Sapien 3 THV via the TF approach by Drs Buena Irish & Roxy Manns  . Severe aortic stenosis   . Sleep apnea with use of continuous positive airway pressure (CPAP)   . Tobacco abuse   . Tremor    HEAD   Past Surgical History:  Procedure Laterality  Date  . ADENOIDECTOMY    . APPENDECTOMY    . CARDIAC CATHETERIZATION    . CATARACT EXTRACTION W/PHACO Left 11/28/2018   Procedure: CATARACT EXTRACTION PHACO AND INTRAOCULAR LENS PLACEMENT (IOC) LEFT, DIABETIC;  Surgeon: Birder Robson, MD;  Location: [facility] ORS;  Service: Ophthalmology;  Laterality: Left;  [name]  00:56 CDE 9.44 Fluid pack lot # [id number]  . EUS N/A 10/13/2017   Procedure: FULL UPPER ENDOSCOPIC ULTRASOUND (EUS) RADIAL;  Surgeon: Holly Bodily, MD;  Location: [facility] ENDOSCOPY;  Service: Gastroenterology;  Laterality: N/A;  . PANCREATICODUODENECTOMY  07/23/2018  . RIGHT/LEFT HEART CATH AND CORONARY ANGIOGRAPHY N/A 02/21/2020   Procedure: RIGHT/LEFT HEART CATH AND CORONARY ANGIOGRAPHY;  Surgeon: Minna Merritts, MD;  Location: [facility] CV LAB;  Service: Cardiovascular;  Laterality: N/A;  . TEE WITHOUT CARDIOVERSION N/A 04/01/2020   Procedure: TRANSESOPHAGEAL ECHOCARDIOGRAM (TEE);  Surgeon: Burnell Blanks, MD;  Location: [facility] LAB;  Service: Open Heart Surgery;  Laterality: N/A;  . TONSILLECTOMY    . TRANSCATHETER AORTIC VALVE REPLACEMENT, TRANSFEMORAL N/A 04/01/2020   Procedure: TRANSCATHETER AORTIC VALVE REPLACEMENT, TRANSFEMORAL;  Surgeon: Burnell Blanks, MD;  Location: [facility] LAB;  Service: Open Heart Surgery;  Laterality: N/A;  . wipple  Current Outpatient Medications  Medication Sig Dispense Refill  . albuterol (PROVENTIL) (2.5 MG/3ML) 0.083% nebulizer solution Take 2.5 mg by nebulization every 6 (six) hours as needed for wheezing or shortness of breath.    [name] albuterol (VENTOLIN HFA) 108 (90 Base) MCG/ACT inhaler INHALE 2 PUFFS INTO THE LUNGS EVERY 6 HOURS AS NEEDED FOR WHEEZING OR SHORTNESS OF BREATH 8.5 g 0  . aspirin EC 81 MG tablet Take 81 mg by mouth daily.    [name] atorvastatin (LIPITOR) 80 MG tablet Take 80 mg by mouth daily.     . Budeson-Glycopyrrol-Formoterol (BREZTRI AEROSPHERE) 160-9-4.8 MCG/ACT AERO Inhale 2 puffs into  the lungs 2 (two) times daily. 5.9 g 0  . budesonide-formoterol (SYMBICORT) 80-4.5 MCG/ACT inhaler Inhale 2 puffs into the lungs 2 (two) times daily. 10.2 g 3  . cloNIDine (CATAPRES) 0.1 MG tablet Take 0.1 mg by mouth 2 (two) times daily.    . clopidogrel (PLAVIX) 75 MG tablet TAKE 1 TABLET BY MOUTH ONCE DAILY 90 tablet 2  . fluticasone (FLONASE) 50 MCG/ACT nasal spray Place 1 spray into both nostrils in the morning and at bedtime. 15.8 mL 3  . gabapentin (NEURONTIN) 100 MG capsule Take 1-2 capsules (100-200 mg total) by mouth at bedtime. 180 capsule 1  . hydrochlorothiazide (HYDRODIURIL) 25 MG tablet Take 1 tablet (25 mg total) by mouth daily. 30 tablet 5  . insulin glargine (LANTUS) 100 unit/mL SOPN Inject 20 Units into the skin at bedtime.    . metFORMIN (GLUCOPHAGE) 500 MG tablet Take 1,000 mg by mouth 2 (two) times daily.     . pantoprazole (PROTONIX) 40 MG tablet Take 1 tablet (40 mg total) by mouth 2 (two) times daily. 60 tablet 11  . SPIRIVA HANDIHALER 18 MCG inhalation capsule INHALE CONTENTS OF 1 CAPSULE ONCE DAILY (Patient taking differently: Place 18 mcg into inhaler and inhale daily.) 30 capsule 5  . traZODone (DESYREL) 100 MG tablet Take by mouth.    . carvedilol (COREG) 3.125 MG tablet Take 1 tablet (3.125 mg total) by mouth 2 (two) times daily. 60 tablet 5   No current facility-administered medications for this visit.    Physical Exam BP 138/65   Pulse 73   Temp 97.6 F (36.4 C) (Skin)   Resp 20   Ht 5\' 5"  (1.651 m)   Wt 208 lb (94.3 kg)   SpO2 92% Comment: RA  BMI 34.33 kg/m  75 year old man in no acute distress Alert and oriented x3 with no focal deficits Lungs equal breath sounds bilaterally, no rales or wheezing Cardiac regular rate and rhythm with a faint systolic murmur Obese soft nontender No peripheral edema  Diagnostic Tests: I again reviewed his CT from 09/16/2020 which showed a 1.5 cm spiculated nodule in the superior segment of the left lower lobe.  No  mediastinal or hilar adenopathy.  Findings most consistent with a T1, N0, stage Ia primary bronchogenic carcinoma.  Pulmonary function testing 10/28/2020 FVC 2.61 (64%) postbronchodilator FEV1 1.65 (56%) FEV1 1.62 (55%) postbronchodilator DLCO 13.95 (57%)  Impression: Nathaniel Little is a 75 year old man with a complex medical history including a long history of heavy tobacco abuse, COPD, aortic stenosis, TAVR, IPMN status post Whipple, choledocholithiasis, pancreatitis, obesity, sleep apnea, arthritis, type 2 diabetes, lymphedema, and a left lower lobe lung nodule.  He was found to have a left lower lobe lung nodule last summer when he was being evaluated for TAVR for aortic stenosis.  On PET there was mild uptake with an SUV of  2.0.  It was still felt to be suspicious for primary bronchogenic carcinoma.  He had several follow-up scans.  In December the nodule had increased in size.  He saw Dr. Patsey Berthold who referred him for possible surgery.  I had a long discussion with Nathaniel Little a couple of weeks ago regarding surgery and radiation as possible modes of treatment.  I really try to encourage him to consider radiation more so than surgery.  Despite that he very strongly wishes to have a surgical resection.  He has a complex medical history but was able to tolerate a Whipple procedure couple years ago.   The plan would be to do a robotic left VATS for superior segmentectomy, but a lower lobectomy may be necessary to get an adequate margin.  He does have adequate pulmonary function to tolerate that procedure.  I again discussed the indications, risks, benefits, and alternatives.  He understands the general nature of the procedure and the need for drainage tube postoperatively.  He understands the risks include, but are not limited to death, MI, DVT, PE, bleeding, possible need for transfusion, infection, prolonged air leak, cardiac arrhythmias, as well as possibility of other unforeseeable complications.   He understand surgical resection is no guarantee of a cure.  We should check with Dr. Rockey Situ regarding any additional cardiac work-up.  He has had a TAVR which seems to be functioning well and did not have any significant coronary disease on catheterization in May of last year.  [place] to be sure he does not think any additional work-up is necessary prior to surgical resection.  He is on Plavix partially for his TAVR valve but also for stents in his hepatic artery.  He will have to be off that for 5 days prior to surgery.  Plan: Plan for robotic left lower lobe superior segmentectomy or lobectomy on Monday, 11/17/2020. We will check with Dr. Rockey Situ regarding any possible cardiac evaluation that needs to be done.  I spent over 30 minutes in review of images, records, and the consultation with Nathaniel Little today. Melrose Nakayama, MD [facility] [phone]

## 2020-11-04 NOTE — H&P (View-Only) (Signed)
[address]       [place] [address]             [phone]      HPI: Nathaniel Little returns to discuss management of his left lower lobe lung nodule  Nathaniel Little is a 75 year old man with a complex medical history including a long history of heavy tobacco abuse, COPD, aortic stenosis, TAVR, IPMN status post Whipple, choledocholithiasis, pancreatitis, obesity, sleep apnea, arthritis, type 2 diabetes, lymphedema, and a left lower lobe lung nodule.  He has about a 200-pack-year history of smoking with up to 4 packs a day for over 50 years.  He currently is smoking a few cigarettes a day.  He was found to have a lung nodule on a CT that was done prior to his TAVR in June 2021.  A PET/CT showed mild uptake with an SUV of 2.  Follow-up CT in August showed no change.  Repeat CT in December showed it had increased in size to 1.5 cm.  I saw him last week we discussed possible surgical resection.  He had pulmonary function tests now returns to further discuss management of the nodule.  He did see Dr. Patsey Little in [place] last week.  Past Medical History:  Diagnosis Date  . (HFpEF) heart failure with preserved ejection fraction ([place])    a. 08/2017 Echo: EF 60-65%, Gr1 DD.  [name] Arthritis   . COPD (chronic obstructive pulmonary disease) ([place])   . Depression   . Diabetes mellitus without complication ([place])   . Hypertension   . Incidental pulmonary nodule, greater than or equal to 60mm 03/11/2020   Left lower lobe - suspicious for bronchogenic neoplasm  . Lymphedema   . Morbid obesity ([place])   . Prostate cancer ([place]) 03/2013   ? PANCREATIC  . S/P TAVR (transcatheter aortic valve replacement) 04/01/2020   s/p TAVR with a 29 mm Edwards Sapien 3 THV via the TF approach by Drs Buena Irish & Roxy Manns  . Severe aortic stenosis   . Sleep apnea with use of continuous positive airway pressure (CPAP)   . Tobacco abuse   . Tremor    HEAD   Past Surgical History:  Procedure Laterality  Date  . ADENOIDECTOMY    . APPENDECTOMY    . CARDIAC CATHETERIZATION    . CATARACT EXTRACTION W/PHACO Left 11/28/2018   Procedure: CATARACT EXTRACTION PHACO AND INTRAOCULAR LENS PLACEMENT (IOC) LEFT, DIABETIC;  Surgeon: Birder Robson, MD;  Location: [facility] ORS;  Service: Ophthalmology;  Laterality: Left;  [name]  00:56 CDE 9.44 Fluid pack lot # [id number]  . EUS N/A 10/13/2017   Procedure: FULL UPPER ENDOSCOPIC ULTRASOUND (EUS) RADIAL;  Surgeon: Holly Bodily, MD;  Location: [facility] ENDOSCOPY;  Service: Gastroenterology;  Laterality: N/A;  . PANCREATICODUODENECTOMY  07/23/2018  . RIGHT/LEFT HEART CATH AND CORONARY ANGIOGRAPHY N/A 02/21/2020   Procedure: RIGHT/LEFT HEART CATH AND CORONARY ANGIOGRAPHY;  Surgeon: Minna Merritts, MD;  Location: [place] [facility] LAB;  Service: Cardiovascular;  Laterality: N/A;  . TEE WITHOUT CARDIOVERSION N/A 04/01/2020   Procedure: TRANSESOPHAGEAL ECHOCARDIOGRAM (TEE);  Surgeon: Burnell Blanks, MD;  Location: [place] [facility];  Service: Open Heart Surgery;  Laterality: N/A;  . TONSILLECTOMY    . TRANSCATHETER AORTIC VALVE REPLACEMENT, TRANSFEMORAL N/A 04/01/2020   Procedure: TRANSCATHETER AORTIC VALVE REPLACEMENT, TRANSFEMORAL;  Surgeon: Burnell Blanks, MD;  Location: [place] [facility];  Service: Open Heart Surgery;  Laterality: N/A;  . wipple  Current Outpatient Medications  Medication Sig Dispense Refill  . albuterol (PROVENTIL) (2.5 MG/3ML) 0.083% nebulizer solution Take 2.5 mg by nebulization every 6 (six) hours as needed for wheezing or shortness of breath.    [name] albuterol (VENTOLIN HFA) 108 (90 Base) MCG/ACT inhaler INHALE 2 PUFFS INTO THE LUNGS EVERY 6 HOURS AS NEEDED FOR WHEEZING OR SHORTNESS OF BREATH 8.5 g 0  . aspirin EC 81 MG tablet Take 81 mg by mouth daily.    [name] atorvastatin (LIPITOR) 80 MG tablet Take 80 mg by mouth daily.     . Budeson-Glycopyrrol-Formoterol (BREZTRI AEROSPHERE) 160-9-4.8 MCG/ACT AERO Inhale 2 puffs into  the lungs 2 (two) times daily. 5.9 g 0  . budesonide-formoterol (SYMBICORT) 80-4.5 MCG/ACT inhaler Inhale 2 puffs into the lungs 2 (two) times daily. 10.2 g 3  . cloNIDine (CATAPRES) 0.1 MG tablet Take 0.1 mg by mouth 2 (two) times daily.    . clopidogrel (PLAVIX) 75 MG tablet TAKE 1 TABLET BY MOUTH ONCE DAILY 90 tablet 2  . fluticasone (FLONASE) 50 MCG/ACT nasal spray Place 1 spray into both nostrils in the morning and at bedtime. 15.8 mL 3  . gabapentin (NEURONTIN) 100 MG capsule Take 1-2 capsules (100-200 mg total) by mouth at bedtime. 180 capsule 1  . hydrochlorothiazide (HYDRODIURIL) 25 MG tablet Take 1 tablet (25 mg total) by mouth daily. 30 tablet 5  . insulin glargine (LANTUS) 100 unit/mL SOPN Inject 20 Units into the skin at bedtime.    . metFORMIN (GLUCOPHAGE) 500 MG tablet Take 1,000 mg by mouth 2 (two) times daily.     . pantoprazole (PROTONIX) 40 MG tablet Take 1 tablet (40 mg total) by mouth 2 (two) times daily. 60 tablet 11  . SPIRIVA HANDIHALER 18 MCG inhalation capsule INHALE CONTENTS OF 1 CAPSULE ONCE DAILY (Patient taking differently: Place 18 mcg into inhaler and inhale daily.) 30 capsule 5  . traZODone (DESYREL) 100 MG tablet Take by mouth.    . carvedilol (COREG) 3.125 MG tablet Take 1 tablet (3.125 mg total) by mouth 2 (two) times daily. 60 tablet 5   No current facility-administered medications for this visit.    Physical Exam BP 138/65   Pulse 73   Temp 97.6 F (36.4 C) (Skin)   Resp 20   Ht 5\' 5"  (1.651 m)   Wt 208 lb (94.3 kg)   SpO2 92% Comment: RA  BMI 34.2 kg/m  75 year old man in no acute distress Alert and oriented x3 with no focal deficits Lungs equal breath sounds bilaterally, no rales or wheezing Cardiac regular rate and rhythm with a faint systolic murmur Obese soft nontender No peripheral edema  Diagnostic Tests: I again reviewed his CT from 09/16/2020 which showed a 1.5 cm spiculated nodule in the superior segment of the left lower lobe.  No  mediastinal or hilar adenopathy.  Findings most consistent with a T1, N0, stage Ia primary bronchogenic carcinoma.  Pulmonary function testing 10/28/2020 FVC 2.61 (64%) postbronchodilator FEV1 1.65 (56%) FEV1 1.62 (55%) postbronchodilator DLCO 13.95 (57%)  Impression: Nathaniel Little is a 75 year old man with a complex medical history including a long history of heavy tobacco abuse, COPD, aortic stenosis, TAVR, IPMN status post Whipple, choledocholithiasis, pancreatitis, obesity, sleep apnea, arthritis, type 2 diabetes, lymphedema, and a left lower lobe lung nodule.  He was found to have a left lower lobe lung nodule last summer when he was being evaluated for TAVR for aortic stenosis.  On PET there was mild uptake with an SUV of  2.0.  It was still felt to be suspicious for primary bronchogenic carcinoma.  He had several follow-up scans.  In December the nodule had increased in size.  He saw Dr. Patsey Little who referred him for possible surgery.  I had a long discussion with Nathaniel Little a couple of weeks ago regarding surgery and radiation as possible modes of treatment.  I really try to encourage him to consider radiation more so than surgery.  Despite that he very strongly wishes to have a surgical resection.  He has a complex medical history but was able to tolerate a Whipple procedure couple years ago.   The plan would be to do a robotic left VATS for superior segmentectomy, but a lower lobectomy may be necessary to get an adequate margin.  He does have adequate pulmonary function to tolerate that procedure.  I again discussed the indications, risks, benefits, and alternatives.  He understands the general nature of the procedure and the need for drainage tube postoperatively.  He understands the risks include, but are not limited to death, MI, DVT, PE, bleeding, possible need for transfusion, infection, prolonged air leak, cardiac arrhythmias, as well as possibility of other unforeseeable complications.   He understand surgical resection is no guarantee of a cure.  We should check with Dr. Rockey Situ regarding any additional cardiac work-up.  He has had a TAVR which seems to be functioning well and did not have any significant coronary disease on catheterization in May of last year.  [place] to be sure he does not think any additional work-up is necessary prior to surgical resection.  He is on Plavix partially for his TAVR valve but also for stents in his hepatic artery.  He will have to be off that for 5 days prior to surgery.  Plan: Plan for robotic left lower lobe superior segmentectomy or lobectomy on Monday, 11/17/2020. We will check with Dr. Rockey Situ regarding any possible cardiac evaluation that needs to be done.  I spent over 30 minutes in review of images, records, and the consultation with Nathaniel Little today. Melrose Nakayama, MD [facility] [phone]

## 2020-11-05 ENCOUNTER — Encounter: Payer: Self-pay | Admitting: *Deleted

## 2020-11-07 ENCOUNTER — Ambulatory Visit: Payer: Medicare Other | Admitting: Physician Assistant

## 2020-11-07 ENCOUNTER — Other Ambulatory Visit: Payer: Self-pay

## 2020-11-07 ENCOUNTER — Encounter: Payer: Self-pay | Admitting: Physician Assistant

## 2020-11-07 VITALS — BP 150/68 | HR 68 | Ht 69.0 in | Wt 210.0 lb

## 2020-11-07 DIAGNOSIS — E1169 Type 2 diabetes mellitus with other specified complication: Secondary | ICD-10-CM | POA: Diagnosis not present

## 2020-11-07 DIAGNOSIS — K625 Hemorrhage of anus and rectum: Secondary | ICD-10-CM

## 2020-11-07 DIAGNOSIS — Z8639 Personal history of other endocrine, nutritional and metabolic disease: Secondary | ICD-10-CM | POA: Diagnosis not present

## 2020-11-07 DIAGNOSIS — E876 Hypokalemia: Secondary | ICD-10-CM

## 2020-11-07 DIAGNOSIS — Z952 Presence of prosthetic heart valve: Secondary | ICD-10-CM | POA: Diagnosis not present

## 2020-11-07 DIAGNOSIS — J449 Chronic obstructive pulmonary disease, unspecified: Secondary | ICD-10-CM

## 2020-11-07 DIAGNOSIS — Z720 Tobacco use: Secondary | ICD-10-CM | POA: Diagnosis not present

## 2020-11-07 DIAGNOSIS — I700 Atherosclerosis of aorta: Secondary | ICD-10-CM | POA: Diagnosis not present

## 2020-11-07 DIAGNOSIS — R911 Solitary pulmonary nodule: Secondary | ICD-10-CM | POA: Diagnosis not present

## 2020-11-07 DIAGNOSIS — Z79899 Other long term (current) drug therapy: Secondary | ICD-10-CM | POA: Diagnosis not present

## 2020-11-07 DIAGNOSIS — G4730 Sleep apnea, unspecified: Secondary | ICD-10-CM

## 2020-11-07 DIAGNOSIS — I5032 Chronic diastolic (congestive) heart failure: Secondary | ICD-10-CM

## 2020-11-07 DIAGNOSIS — Z01810 Encounter for preprocedural cardiovascular examination: Secondary | ICD-10-CM | POA: Diagnosis not present

## 2020-11-07 DIAGNOSIS — E782 Mixed hyperlipidemia: Secondary | ICD-10-CM

## 2020-11-07 DIAGNOSIS — I10 Essential (primary) hypertension: Secondary | ICD-10-CM

## 2020-11-07 MED ORDER — EZETIMIBE 10 MG PO TABS: 10.0000 mg | ORAL_TABLET | Freq: Every day | ORAL | 3 refills | Status: DC

## 2020-11-07 MED ORDER — CARVEDILOL 6.25 MG PO TABS: 6.2500 mg | ORAL_TABLET | Freq: Two times a day (BID) | ORAL | 3 refills | Status: DC

## 2020-11-07 NOTE — Patient Instructions (Signed)
Medication Instructions:  Your physician has recommended you make the following change in your medication:   START Zetia 10mg  daily  INCREASE Carvedilol to 6.25mg  TWICE daily - A new Rx has been sent to your pharmacy. You may take 2 tablets of previous dose (3.125mg ) prior to filling new Rx.   You may HOLD Plavix 5 days prior to your surgery   *If you need a refill on your cardiac medications before your next appointment, please call your pharmacy*   Lab Work: Your physician recommends that you have lab work TODAY: Bmet, CBC  If you have labs (blood work) drawn today and your tests are completely normal, you will receive your results only by: [name] MyChart Message (if you have MyChart) OR . A paper copy in the mail If you have any lab test that is abnormal or we need to change your treatment, we will call you to review the results.   Testing/Procedures: None ordered   Follow-Up: At [facility], you and your health needs are our priority.  As part of our continuing mission to provide you with exceptional heart care, we have created designated Provider Care Teams.  These Care Teams include your primary Cardiologist (physician) and Advanced Practice Providers (APPs -  Physician Assistants and Nurse Practitioners) who all work together to provide you with the care you need, when you need it.  We recommend signing up for the patient portal called "MyChart".  Sign up information is provided on this After Visit Summary.  MyChart is used to connect with patients for Virtual Visits (Telemedicine).  Patients are able to view lab/test results, encounter notes, upcoming appointments, etc.  Non-urgent messages can be sent to your provider as well.   To learn more about what you can do with MyChart, go to [email or web address].    Your next appointment:    Follow up March or April 2022  The format for your next appointment:   In Person  Provider:   You may see Ida Rogue, MD or one of  the following Advanced Practice Providers on your designated Care Team:    Murray Hodgkins, NP  Christell Faith, PA-C  Marrianne Mood, PA-C  Cadence Stonegate, [place]  Laurann [place], NP

## 2020-11-07 NOTE — Progress Notes (Signed)
Office Visit    Patient Name: Nathaniel Little Date of Encounter: 11/07/2020  Primary Care Provider:  Verl Bangs, FNP Primary Cardiologist:  Ida Rogue, MD  Chief Complaint    Chief Complaint  Patient presents with  . Pre op clearance     XI ROBOTIC ASSISTED THORACOSCOPY-SUPERIOR SEGMENTECTOMY Left General possible XI ROBOTIC ASSISTED THORASCOPY-LEFT LOWER LOBECTOMY   Medications reviewed by the patient verbally. Patient c/o shortness of breath with over exertion.  Patient will need to be off the Plavix 5 days prior to surgery; surgery is scheduled for Feb. 14, 2022.    75 yo male with history of HFpEF, severe AS s/p TAVR 04/01/2020, aortic atherosclerosis, HTN, HLD, PAD and s/p stent to GDA and hepatic arteries after common and proper hepatic arteries were found dissected and occluded 07/2018, COPD, current tobacco use, obesity, sleep apnea on CPAP, prostate CA [place], h/o pancreatic mass / IPMN s/p Whipple procedure 07/20/2018, GERD, and here today for preoperative cardiac evaluation prior to L lobectomy for 11/17/20.  Past Medical History    Past Medical History:  Diagnosis Date  . (HFpEF) heart failure with preserved ejection fraction ([place])    a. 08/2017 Echo: EF 60-65%, Gr1 DD.  [name] Arthritis   . COPD (chronic obstructive pulmonary disease) ([place])   . Depression   . Diabetes mellitus without complication ([place])   . Hypertension   . Incidental pulmonary nodule, greater than or equal to 10m 03/11/2020   Left lower lobe - suspicious for bronchogenic neoplasm  . Lymphedema   . Morbid obesity ([place]   . Prostate cancer ([place] 03/2013   ? PANCREATIC  . 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 Severe aortic stenosis   . Sleep apnea with use of continuous positive airway pressure (CPAP)   . Tobacco abuse   . Tremor    HEAD   Past  Surgical History:  Procedure Laterality Date  . ADENOIDECTOMY    . APPENDECTOMY    . CARDIAC CATHETERIZATION    . CATARACT EXTRACTION W/PHACO Left 11/28/2018   Procedure: CATARACT EXTRACTION PHACO AND INTRAOCULAR LENS PLACEMENT (IOC) LEFT, DIABETIC;  Surgeon: PBirder Robson MD;  Location: [facility] ORS;  Service: Ophthalmology;  Laterality: Left;  UKorea 00:56 CDE 9.44 Fluid pack lot # [id number]  . EUS N/A 10/13/2017   Procedure: FULL UPPER ENDOSCOPIC ULTRASOUND (EUS) RADIAL;  Surgeon: [name];  [facility];  Service: Gastroenterology;  Laterality: N/A;  . PANCREATICODUODENECTOMY  07/23/2018  . RIGHT/LEFT HEART CATH AND CORONARY ANGIOGRAPHY N/A 02/21/2020   Procedure: RIGHT/LEFT HEART CATH AND CORONARY ANGIOGRAPHY;  Surgeon: GMinna Merritts MD;  [facility];  Service: Cardiovascular;  Laterality: N/A;  . TEE WITHOUT CARDIOVERSION N/A 04/01/2020   Procedure: TRANSESOPHAGEAL ECHOCARDIOGRAM (TEE);  Surgeon: MBurnell Blanks MD;  [facility];  Service: Open Heart Surgery;  Laterality: N/A;  . TONSILLECTOMY    . TRANSCATHETER AORTIC VALVE REPLACEMENT, TRANSFEMORAL N/A 04/01/2020   Procedure: TRANSCATHETER AORTIC VALVE REPLACEMENT, TRANSFEMORAL;  Surgeon: MBurnell Blanks MD;  [facility];  Service: Open Heart Surgery;  Laterality: N/A;  . wipple      Allergies  Allergies  Allergen Reactions  . Codeine Itching    History of Present Illness    HLavarr Presidentis a 75y.o.  male with PMH as above.   He was evaluated in 2018 for intermittent exertional CP and DOE with stress testing without evidence of ischemia.   He was seen by his primary cardiologist 01/19/2019, at which time he was reportedly doing well. He reported chronic leg pain that sometimes occurred while he slept and sometimes during the day. He was SOB at times with use of inhalers and smoking 2 cigarettes daily. He was able to reportedly  walk 18 blocks without issue.   He underwent Whipple procedure 07/2018 by [facility]. Review of EMR notes he presented 07/20/2018 for Whipple for IPMN. Robotic whipple was performed. In the postoperative period, however, he had hematemesis 10/26 concerning for GIB. The common and proper hepatic arteries were found to be dissected and occluded; however, it was also noted that the patient had robust portal flow to the liver. Vascular surgery was consulted and the patient taken to the OR with VIR where a stent was placed in his GDA and hepatic arteries. He was started on Plavix.  Follow-up echo 02/2020 showed significant progression of severe aortic stenosis with preserved LVSF. He was referred to the structural heart team at that time.   Shortly thereafter, he was hospitalized at [facility] with abdominal pain, elevated WBC, and mild lactic acidosis. CT scan of abdomen and pelvis revealed considerable peripancreatic and duodenal inflammatory change, consistent with acute pancreatitis or duodenitis. He was treated with Cipro and Flagyl with resolution of symptoms.   He underwent [facility] 02/21/2020 that revealed nonobstructive CAD with normal LVSF and severe aortic stenosis. There is mild elevation of right heart pressures.  Carotid US study showed mild bilateral carotid dz.  He was seen in consultation by Dr. Angelena Form. CTA performed and revealed a small pulmonary nodule in the left lower lobe, suspicious for bronchogenic neoplasm. Follow-up PET scan 03/2020 showed mild hypermetabolism, raising concern for primary bronchogenic neoplasm. There were no findings suspicious for metastatic disease. There was also a 15 mm hypermetabolic lesion in the left posterior parotid gland, suggesting benign or malignant parotid neoplasm, possibly Warthin's tumor.  He was evaluated by the structural heart team and underwent successful TAVR 04/01/2020.   Postop EF 60%, normally functioning TAVR with mean gradient 14 mmHg and trivial PVL.  He was discharged on ASA and Plavix.   He was last seen 05/01/2020 and reported increased energy and improvement in breathing. SBE prophylaxis discussed. He was continued on ASA and Plavix. It was noted Plavix could be discontinued after 6 months of therapy. Recommendation was for follow-up echo in 1 year.   Referral was made to multidisciplinary thoracic cancer team.  Complete cessation of tobacco recommended.  He has been scheduled for L lower lobectomy on 11/17/20.  Today, 11/07/2020, he returns to clinic and notes he has both good days and bad days.  He notes that he has some days that are really bad from a breathing standpoint.  He reports a productive cough with white phlegm, which is chronic and ongoing for him.  He reports hot flashes at night.  He continues to smoke, estimated 3 cigarettes/day.  He reports his intention to completely stop smoking after his upcoming left lobectomy, scheduled for 11/17/2020.  He reports cutting back on caffeine with green tea and 1 soda per month.  He drinks 3 to 4 bottles of 12 ounce water per day.  He reports using salt but will look into Mrs. Dash.  He was previously using pink Welda salt with long discussion today that that will still  increase BP and resultant fluid retention.  He states that he feels the cost of Mrs. Dash went up, which is the reason he has not been using it.  He reports one teaspoonful of dark blood in the toilet last Wednesday night.  He is uncertain the reason for this blood, but it has not recurred since that time.  He wonders if the blood was 2/2 hemorrhoids.  He does note occasional constipation.  He reports adequate urine output, often peeing 15 times per day.  He denies any chest pain.  He occasionally experiences racing heart rate.  He reports dizziness only when bending over/bendopnea.  No loss of consciousness.  He reports intermittent bloating.  He has noted weight gain and increase of weight up to 3 pounds.  He denies PND or early  satiety.. He does note using 2 pillows, previously only using 1 pillow; however, it is unclear if this is just due to comfort given he reports his previous pillow is very thin.  He requests cardiac evaluation today for his upcoming left lobectomy 11/17/2020.  He reports monitoring his BP at home with SBP 125- ?245 (unclear if this is actually the reading, as he did not bring a BP log or his BP cuff today, and this reading is quite high as we discussed today) but on average between 734 and 193 systolic.  He notes DBP 55-80.   He reports he has self- discontinued his Plavix after noting the BRBPR (since 11/05/20) and wishes to completely stop it, which we discussed was noted by the TAVR team as acceptable given he is now 6 months s/p TAVR. Plavix was not restarted this visit given TAVR recommendation that he may discontinue it and also upcoming procedure on 11/17/20, which necessitates that he discontinue his Plavix at least 5 days prior.   Home Medications    Prior to Admission medications   Medication Sig Start Date End Date Taking? Authorizing Provider  ACCU-CHEK AVIVA PLUS test strip  05/24/19  Yes [provider]  albuterol (PROVENTIL) (2.5 MG/3ML) 0.083% nebulizer solution Take 2.5 mg by nebulization every 6 (six) hours as needed for wheezing or shortness of breath.   Yes [provider]  albuterol (VENTOLIN HFA) 108 (90 Base) MCG/ACT inhaler Inhale 2 puffs into the lungs every 6 (six) hours as needed for wheezing or shortness of breath. 08/22/19  Yes Karamalegos, Devonne Doughty, DO  aspirin EC 81 MG tablet Take 81 mg by mouth daily.   Yes [provider]  blood glucose meter kit and supplies Dispense based on patient and insurance preference. Use up to four times daily as directed. (FOR ICD-10 E10.9, E11.9). 08/18/18  Yes [name], NP  cloNIDine (CATAPRES) 0.1 MG tablet TAKE 1 TABLET BY MOUTH TWICE DAILY 01/07/20  Yes [name], [name], FNP  clopidogrel (PLAVIX) 75 MG  tablet TAKE 1 TABLET BY MOUTH ONCE DAILY 02/05/20  Yes [name], [name], FNP  diclofenac (VOLTAREN) 75 MG EC tablet TAKE 1 TABLET BY MOUTH TWICE DAILY WITH FOOD AFTER 14 DAYS TAKE ONLY AS NEEDED 01/08/20  Yes [name], [name], FNP  diclofenac Sodium (VOLTAREN) 1 % GEL Apply 2 g topically 4 (four) times daily. 10/14/19  Yes Johnn Hai, PA-C  Ferrous Sulfate (IRON SUPPLEMENT PO) Take by mouth.   Yes [provider]  gabapentin (NEURONTIN) 100 MG capsule Take 200 mg by mouth at bedtime.  01/29/19  Yes [provider]  hydrochlorothiazide (HYDRODIURIL) 12.5 MG tablet TAKE 1 TABLET BY  MOUTH ONCE DAILY 02/05/20  Yes [name], [name], FNP  insulin glargine (LANTUS) 100 unit/mL SOPN Inject 20 Units into the skin at bedtime.   Yes [provider]  ipratropium-albuterol (DUONEB) 0.5-2.5 (3) MG/3ML SOLN Take 3 mLs by nebulization every 6 (six) hours as needed (wheezing, shortness of breath). 04/20/17  Yes [name], NP  metFORMIN (GLUCOPHAGE) 500 MG tablet Take 1,000 mg by mouth 2 (two) times daily.  11/04/18  Yes [provider]  pantoprazole (PROTONIX) 40 MG tablet Take 1 tablet (40 mg total) by mouth 2 (two) times daily. 12/06/19 12/05/20 Yes [name], [name], FNP  SYMBICORT 80-4.5 MCG/ACT inhaler INHALE 2 PUFFS BY MOUTH TWICE DAILY 08/17/19  Yes Karamalegos, Devonne Doughty, DO  tiotropium (SPIRIVA HANDIHALER) 18 MCG inhalation capsule Place 1 capsule (18 mcg total) into inhaler and inhale daily. 11/20/18  Yes [name], NP  traZODone (DESYREL) 100 MG tablet Take 100 mg by mouth at bedtime as needed for sleep.   Yes [provider]  docusate sodium (COLACE) 100 MG capsule Take 100 mg by mouth daily as needed for mild constipation.    [provider]  sildenafil (REVATIO) 20 MG tablet Take 1-5 pills about 30 min prior to sex. Start with 1 and increase as needed. Patient not taking: Reported on 02/01/2020 08/13/19   Olin Hauser, DO     Review of Systems    No CP. He has ongoing SOB/DOE and still smoking though intends to quit after upcoming L lobectomy as above. He occasionally experiences racing heart rate.  He reports dizziness when bending over/bendopnea.  No loss of consciousness.  He reports intermittent bloating / abdominal distention. He recently noted 1 episode of BRBPR last Wednesday night, attributed to possible hemorrhoids due to occasional constipation. He has noted weight gain of 3 pounds.  He denies PND or early satiety.  He does note using 2 pillows, previously only using 1 pillow; however, it is unclear if this is just due to comfort versus orthopnea given he reports his previous pillow is very thin.  No PND, n, v, syncope, edema, or early satiety.[name] He reports a cough that occasionally produces white sputum and has been ongoing for some time.   All other systems reviewed and are otherwise negative except as noted above.  Physical Exam    VS:  BP (!) 150/68 (BP Location: Left Arm, Patient Position: Sitting, Cuff Size: Normal)   Pulse 68   Ht $R'5\' 9"'Yi$  (1.753 m)   Wt 210 lb (95.3 kg)   SpO2 98%   BMI 31.01 kg/m  , BMI Body mass index is 31.01 kg/m. GEN: Well nourished, well developed, in no acute distress. HEENT: normal. Neck: Supple, no JVD, no carotid bruit. no masses. Cardiac: RRR, 1/6 systolic murmur, no rubs or gallops. No clubbing, cyanosis. No bilateral edema.  Radials/DP/PT 2+ and equal bilaterally.  Respiratory:  Respirations regular and unlabored, CTAB GI: Soft, nontender, nondistended, BS + x 4. MS: no deformity or atrophy. Skin: warm and dry, no rash. Neuro:  Strength and sensation are intact. Psych: Normal affect.  Accessory Clinical Findings    ECG personally reviewed by me today -NSR, 68bpm, first degree AVB with PRi 276ms, 68 bpm, IVCD, LAFB, LAD, previous inferior infarct, cannot rule out anterior infarct- no acute changes.  VITALS Reviewed today   Temp Readings from Last 3  Encounters:  11/04/20 97.6 F (36.4 C) (Skin)  10/28/20 (!) 97.3 F (36.3 C) (Temporal)  10/21/20 97.6 F (  36.4 C) (Skin)   BP Readings from Last 3 Encounters:  11/07/20 (!) 150/68  11/04/20 138/65  10/28/20 (!) 150/90   Pulse Readings from Last 3 Encounters:  11/07/20 68  11/04/20 73  10/28/20 71    Wt Readings from Last 3 Encounters:  11/07/20 210 lb (95.3 kg)  11/04/20 208 lb (94.3 kg)  10/28/20 208 lb 12.8 oz (94.7 kg)     LABS  reviewed today   CareEverwhere Labs present and most recent? Yes/No: No  Lab Results  Component Value Date   WBC 22.9 (H) 05/25/2020   HGB 12.5 (L) 05/25/2020   HCT 37.1 (L) 05/25/2020   MCV 77.5 (L) 05/25/2020   PLT 266 05/25/2020   Lab Results  Component Value Date   CREATININE 1.00 05/25/2020   BUN 20 05/25/2020   NA 134 (L) 05/25/2020   K 3.4 (L) 05/25/2020   CL 96 (L) 05/25/2020   CO2 25 05/25/2020   Lab Results  Component Value Date   ALT 65 (H) 05/25/2020   AST 33 05/25/2020   ALKPHOS 233 (H) 05/25/2020   BILITOT 1.5 (H) 05/25/2020   Lab Results  Component Value Date   CHOL 149 02/08/2020   HDL 38 (L) 02/08/2020   LDLCALC 91 02/08/2020   TRIG 108 02/08/2020   CHOLHDL 3.9 02/08/2020    Lab Results  Component Value Date   HGBA1C 6.3 (A) 09/11/2020   Lab Results  Component Value Date   TSH 1.22 01/06/2017     STUDIES/PROCEDURES reviewed today   Echo 05/01/20 1. Left ventricular ejection fraction, by estimation, is 60 to 65%. The  left ventricle has normal function. The left ventricle has no regional  wall motion abnormalities. There is mild left ventricular hypertrophy.  Left ventricular diastolic parameters  are consistent with Grade I diastolic dysfunction (impaired relaxation).  2. Right ventricular systolic function is normal. The right ventricular  size is normal.  3. The mitral valve is normal in structure. Trivial mitral valve  regurgitation. No evidence of mitral stenosis.  4. Post TAVR  04/01/20 with 29 mm Sapien 3 valve. Gradients have increased  since day one post op 04/02/20 mean 14 mm -> 16 mmHg , Peak 28 mm _> 31  mmHg There is a mild PVL that is more apparent than day one post op echo .  The aortic valve has been  repaired/replaced. Aortic valve regurgitation is mild. No aortic stenosis  is present. Procedure Date: 04/01/2020.  5. The inferior vena cava is normal in size with greater than 50%  respiratory variability, suggesting right atrial pressure of 3 mmHg.   Carotid US 03/11/20 Summary:  Right Carotid: Velocities in the right ICA are consistent with a 1-39%  stenosis.  Left Carotid: Velocities in the left ICA are consistent with a 1-39%  stenosis.  Vertebrals: Bilateral vertebral arteries demonstrate antegrade flow.  Subclavians: Normal flow hemodynamics were seen in bilateral subclavian        arteries.   [facility] 02/21/20  The left ventricular ejection fraction is 55-65% by visual estimate.  LV end diastolic pressure is normal.  The left ventricular systolic function is normal.  There is severe aortic valve stenosis.  Assessment & Plan    Preoperative cardiac evaluation --Reports shortness of breath and dyspnea in the setting of pulmonary nodule concerning for primary bronchogenic carcinoma with upcoming 11/17/2020 left lower lobectomy scheduled.  He has NYHA class I - II symptoms, reporting these vary depending on the day.  He is  s/p TAVR and EKG today without significant arrhythmia.  He does not appear significantly volume overloaded on exam.  No recent MI or symptoms of unstable or severe angina.  He has history of CKD but with creatinine under 2.0.  Clinical risk factors include history of congenital heart failure, diabetes on insulin.  Functional capacity is poor at 1-4 METS.  Surgery specific risk is intermediate to high at approximately 5% risk.  Most recent echo 04/2020 with EF normal as above and mild PVL but without significant valvular  disease. RCRI calculated score of 3 or Class IV risk with calculated 11% risk of MACE. Considered is repeat echo; however, his breathing difficulties are stable from previous visits and updating an echo will not change management at this time. Therefore, it is reasonable to proceed with left lower lobectomy without additional cardiac testing or intervention at this time.  As above, reasonable to proceed without further cardiac testing or intervention at this time.  Increased Coreg for more optimal BP control. Recheck BP prior to surgery to ensure at goal.   Obtain BMET to recheck Cr <2.0. Obtain CBC given BRBPR as above.  Recommendation to discontinue clopidogrel 5 days prior to surgery discussed; however, he has already self discontinued this medication, thus we will not restart it at this time.  Continue to hold Plavix as above - pt self discontinued on 11/05/20.  We will touch base with the TAVR team and vascular team (given hepatic stenting) to confirm restart after surgery is not indicated.   Continue ASA 81 mg daily during the perioperative period, unless otherwise indicated by the surgical team.  As above, we will confirm if Plavix is to be restarted after surgery and once felt safe to do so by the surgical team. Suspect we can discontinue Plavix indefinitely but will confirm with both TAVR team and vascular team.  S/p TAVR for Severe Aortic stenosis --S/p 03/2020 TAVR procedure with postoperative echo findings showing no paravalvular leak and normal LV SF.  Most recent 04/2020 echo as above with EF 60 to 65% mild AR.  Increased gradients as above and since 1 day postop.  Mild PVL more apparent than 1 day postop.  He continues to note NYHA class I symptoms.  At times, it does appear as if he has NYHA class II symptoms; however, this is also with consideration of his current pulmonary neoplasm.  SBE prophylaxis discussed.  He has no teeth and does not go to the dentist; however, we did discuss  SBE prophylaxis anyway.  He self discontinued his Plavix on Wednesday, 11/05/2020 due to BRBPR.  Will obtain CBC today.  Given his upcoming lobectomy, will not restart Plavix at this time.  Will reach out to TAVR team and confirm okay to discontinue Plavix indefinitely, given the previous TAVR progress note indicates Plavix can be discontinued after 6 months of therapy, which would be at this time.  Continue to follow-up with the TAVR team as directed and at 1 year from 04/2020 for repeat echo.  HFpEF --Reports SOB/DOE with NYHA class I and at times NYHA class II symptoms. Most recent echo as above 04/2020 with EF nl. Appears relatively euvolemic on exam. His breathing status is complicated by known bronchiogenic neoplasm with upcoming plan for left lower lobeectomy.  Reviewed limiting volume/fluid intake and sodium again today.  He will look into Mrs. Dash.  Increased Coreg as above for more optimal BP control. If renal function allows, consider addition of ACE/ARB at RTC per GDMT.  Essential HTN  --BP today elevated, discussed at length with patient today. Na and volume intake discussed. Increased Coreg as above for more optimal BP control. Ideally, wean off off clonidine in the near future, given rebound HTN likely. Continue HCTZ. Recommend against Voltaren /NSAIDs.  If renal function allows, consider addition of ACE/ARB at RTC per GDMT.  BRBPR --Small amount of blood on 11/05/20 and not recurred, possibly due to constipation and hemorrhoids per pt. Obtain CBC as above. He self discontinued Plavix on 11/05/20 (checking to ensure OK with TAVR and vascular teams) but remains on ASA as above. Further recommendations if indicated pending labs.  HLD --Continue atorvastatin 53m daily for risk factor modification. Added Zetia 136mtoday given LDL not yet at goal and was 91 at last check on 02/08/20.  PAD / Mild carotid dz Stenting to GDA and hepatic arteries --As above, he is s/p stenting after 07/29/18  hematemesis and discovered common and proper hepatic arteries found to be dissected and occluded.  He was previously on Plavix, which he has self discontinued if acceptable per vascular and TAVR team.  Carotid [place] mild dz and no reported s/sx of worsening dz. Recheck CBC. Continue ASA, statin. Added Zetia today.  DM2 --Glycemic control recommended for ongoing risk factor modificaton.   Tobacco use, COPD --Complete cessation advised. Currently reports smoking 2 cigarettes per day. States he will quit after surgery. Continue inhalers.   Sleep apnea on CPAP --Continued CPAP use encouraged.   Medication changes: Added Zetia 1065mincreased to Coreg 6.30m1mD Labs ordered: BMET, CBC Studies / Imaging ordered: None.   Future considerations: ACE/ARB, increased HCTZ. Directions regarding Plavix TBD pending discussion with TAVR and vascular team. Repeat lipid and liver in 6-8 weeks after adding Zetia.  Disposition: RTC 1-2 months.  JacqArvil Chaco-C 11/07/2020

## 2020-11-08 LAB — BASIC METABOLIC PANEL WITH GFR
BUN/Creatinine Ratio: 18 (ref 10–24)
BUN: 26 mg/dL (ref 8–27)
CO2: 19 mmol/L — ABNORMAL LOW (ref 20–29)
Calcium: 9.2 mg/dL (ref 8.6–10.2)
Chloride: 105 mmol/L (ref 96–106)
Creatinine, Ser: 1.42 mg/dL — ABNORMAL HIGH (ref 0.76–1.27)
GFR calc Af Amer: 55 mL/min/1.73 — ABNORMAL LOW
GFR calc non Af Amer: 48 mL/min/1.73 — ABNORMAL LOW
Glucose: 70 mg/dL (ref 65–99)
Potassium: 4.4 mmol/L (ref 3.5–5.2)
Sodium: 140 mmol/L (ref 134–144)

## 2020-11-08 LAB — CBC
Hematocrit: 31.8 % — ABNORMAL LOW (ref 37.5–51.0)
Hemoglobin: 10.3 g/dL — ABNORMAL LOW (ref 13.0–17.7)
MCH: 28.4 pg (ref 26.6–33.0)
MCHC: 32.4 g/dL (ref 31.5–35.7)
MCV: 88 fL (ref 79–97)
Platelets: 158 x10E3/uL (ref 150–450)
RBC: 3.63 x10E6/uL — ABNORMAL LOW (ref 4.14–5.80)
RDW: 17.6 % — ABNORMAL HIGH (ref 11.6–15.4)
WBC: 7.7 x10E3/uL (ref 3.4–10.8)

## 2020-11-10 ENCOUNTER — Telehealth: Payer: Self-pay | Admitting: *Deleted

## 2020-11-10 NOTE — Telephone Encounter (Signed)
Pt made aware of upcoming appt for follow up with Dr. Janese Banks on 3/11 at 1:15pm. Pt verbalized understanding. Appt mailed per pt request.

## 2020-11-11 ENCOUNTER — Telehealth: Payer: Self-pay | Admitting: *Deleted

## 2020-11-11 NOTE — Telephone Encounter (Signed)
Spoke to pt, notified of lab results and provider's recc.  Pt verbalized understanding. He will stop taking HCTZ. He does not have scales at home but states he will monitor BP at home and discuss Metformin dose with his PCP. Pt has no further questions at this time.

## 2020-11-11 NOTE — Telephone Encounter (Signed)
-----   Message from Lennon Alstrom, PA-C sent at 11/10/2020  5:10 PM EST ----- CBC shows overall stable anemia overall compared to previous, though hemoglobin has dropped 2 points over the last 5 months. Recommend follow up with PCP.   Kidney function shows decline compared to previous. Recommend stopping Hydrochlorothiazide and monitoring wt and BP closely in this setting, as well as discussing Metformin dose with PCP.

## 2020-11-11 NOTE — Telephone Encounter (Signed)
Patient returning call.

## 2020-11-11 NOTE — Telephone Encounter (Signed)
Attempted to call pt to review results and provider's recc.  No answer. Lmtcb.  

## 2020-11-13 ENCOUNTER — Other Ambulatory Visit: Payer: Self-pay

## 2020-11-13 ENCOUNTER — Other Ambulatory Visit
Admission: RE | Admit: 2020-11-13 | Discharge: 2020-11-13 | Disposition: A | Discharge status: Home / Self Care | Payer: Medicare Other | Source: Ambulatory Visit | Attending: Thoracic Surgery (Cardiothoracic Vascular Surgery) | Admitting: Thoracic Surgery (Cardiothoracic Vascular Surgery)

## 2020-11-13 ENCOUNTER — Other Ambulatory Visit (HOSPITAL_COMMUNITY): Payer: Medicare Other

## 2020-11-13 DIAGNOSIS — Z01812 Encounter for preprocedural laboratory examination: Secondary | ICD-10-CM | POA: Diagnosis not present

## 2020-11-13 DIAGNOSIS — Z20822 Contact with and (suspected) exposure to covid-19: Secondary | ICD-10-CM | POA: Diagnosis not present

## 2020-11-13 LAB — SARS CORONAVIRUS 2 (TAT 6-24 HRS): SARS Coronavirus 2: NEGATIVE

## 2020-11-13 NOTE — Progress Notes (Incomplete)
Surgical Instructions    Your procedure is scheduled on Monday, November 17, 2020 from 7:30AM - 11:00AM.  Report to [facility] Main Entrance "A" at 5:30 A.M., then check in with the Admitting office.  Call this number if you have problems the morning of surgery:  [phone]   If you have any questions prior to your surgery date call [phone]: Open Monday-Friday 8am-4pm    Remember:  Do not eat or drink after midnight the night before your surgery    Take these medicines the morning of surgery with A SIP OF WATER:  atorvastatin (LIPITOR) Budeson-Glycopyrrol-Formoterol (BREZTRI AEROSPHERE)  carvedilol (COREG)  ezetimibe (ZETIA) pantoprazole (PROTONIX)  IF NEEDED: albuterol (PROVENTIL)  albuterol (VENTOLIN HFA) ipratropium-albuterol (DUONEB)   Follow your surgeon's instructions on when to stop Aspirin and Plavix.  If no instructions were given by your surgeon then you will need to call the office to get those instructions.     As of today, STOP taking any Aleve, Naproxen, Ibuprofen, Motrin, Advil, Goody's, BC's, all herbal medications, fish oil, and all vitamins.   WHAT DO I DO ABOUT MY DIABETES MEDICATION?   [name] Do not take oral diabetes medicines (metFORMIN (GLUCOPHAGE)) the morning of surgery.  . THE NIGHT BEFORE SURGERY, take ___10___ units of _insulin glargine (LANTUS)__insulin.    HOW TO MANAGE YOUR DIABETES BEFORE AND AFTER SURGERY  Why is it important to control my blood sugar before and after surgery? . Improving blood sugar levels before and after surgery helps healing and can limit problems. . A way of improving blood sugar control is eating a healthy diet by: o  Eating less sugar and carbohydrates o  Increasing activity/exercise o  Talking with your doctor about reaching your blood sugar goals . High blood sugars (greater than 180 mg/dL) can raise your risk of infections and slow your recovery, so you will need to focus on controlling your diabetes  during the weeks before surgery. . Make sure that the doctor who takes care of your diabetes knows about your planned surgery including the date and location.  How do I manage my blood sugar before surgery? . Check your blood sugar at least 4 times a day, starting 2 days before surgery, to make sure that the level is not too high or low. . Check your blood sugar the morning of your surgery when you wake up and every 2 hours until you get to the Short Stay unit. o If your blood sugar is less than 70 mg/dL, you will need to treat for low blood sugar: - Do not take insulin. - Treat a low blood sugar (less than 70 mg/dL) with  cup of clear juice (cranberry or apple), 4 glucose tablets, OR glucose gel. - Recheck blood sugar in 15 minutes after treatment (to make sure it is greater than 70 mg/dL). If your blood sugar is not greater than 70 mg/dL on recheck, call [phone] for further instructions. . Report your blood sugar to the short stay nurse when you get to Short Stay.  . If you are admitted to the hospital after surgery: o Your blood sugar will be checked by the staff and you will probably be given insulin after surgery (instead of oral diabetes medicines) to make sure you have good blood sugar levels. o The goal for blood sugar control after surgery is 80-180 mg/dL.                      Do not wear jewelry.  Do not wear lotions, powders, colognes, or deodorant.            Do not shave 48 hours prior to surgery.  Men may shave face and neck.            Do not bring valuables to the hospital.            [facility] is not responsible for any belongings or valuables.  Do NOT Smoke (Tobacco/Vaping) or drink Alcohol 24 hours prior to your procedure If you use a CPAP at night, you may bring all equipment for your overnight stay.   Contacts, glasses, dentures or bridgework may not be worn into surgery, please bring cases for these belongings   For patients admitted to the  hospital, discharge time will be determined by your treatment team.   Patients discharged the day of surgery will not be allowed to drive home, and someone needs to stay with them for 24 hours.    Special instructions:   Nathaniel Little- Preparing For Surgery  Before surgery, you can play an important role. Because skin is not sterile, your skin needs to be as free of germs as possible. You can reduce the number of germs on your skin by washing with CHG (chlorahexidine gluconate) Soap before surgery.  CHG is an antiseptic cleaner which kills germs and bonds with the skin to continue killing germs even after washing.    Oral Hygiene is also important to reduce your risk of infection.  Remember - BRUSH YOUR TEETH THE MORNING OF SURGERY WITH YOUR REGULAR TOOTHPASTE  Please do not use if you have an allergy to CHG or antibacterial soaps. If your skin becomes reddened/irritated stop using the CHG.  Do not shave (including legs and underarms) for at least 48 hours prior to first CHG shower. It is OK to shave your face.  Please follow these instructions carefully.   1. Shower the NIGHT BEFORE SURGERY and the MORNING OF SURGERY  2. If you chose to wash your hair, wash your hair first as usual with your normal shampoo.  3. After you shampoo, rinse your hair and body thoroughly to remove the shampoo.  4. Wash Face and genitals (private parts) with your normal soap.   5.  Shower the NIGHT BEFORE SURGERY and the MORNING OF SURGERY with CHG Soap.   6. Use CHG Soap as you would any other liquid soap. You can apply CHG directly to the skin and wash gently with a scrungie or a clean washcloth.   7. Apply the CHG Soap to your body ONLY FROM THE NECK DOWN.  Do not use on open wounds or open sores. Avoid contact with your eyes, ears, mouth and genitals (private parts). Wash Face and genitals (private parts)  with your normal soap.   8. Wash thoroughly, paying special attention to the area where your surgery  will be performed.  9. Thoroughly rinse your body with warm water from the neck down.  10. DO NOT shower/wash with your normal soap after using and rinsing off the CHG Soap.  11. Pat yourself dry with a CLEAN TOWEL.  12. Wear CLEAN PAJAMAS to bed the night before surgery  13. Place CLEAN SHEETS on your bed the night before your surgery  14. DO NOT SLEEP WITH PETS.   Day of Surgery: Wear Clean/Comfortable clothing the morning of surgery Do not apply any deodorants/lotions.   Remember to brush your teeth WITH YOUR REGULAR TOOTHPASTE.   Please read over  the following fact sheets that you were given.

## 2020-11-14 ENCOUNTER — Encounter (HOSPITAL_COMMUNITY): Payer: Self-pay

## 2020-11-14 ENCOUNTER — Encounter (HOSPITAL_COMMUNITY)
Admission: RE | Admit: 2020-11-14 | Discharge: 2020-11-14 | Disposition: A | Discharge status: Home / Self Care | Payer: Medicare Other | Source: Ambulatory Visit | Attending: Thoracic Surgery (Cardiothoracic Vascular Surgery) | Admitting: Thoracic Surgery (Cardiothoracic Vascular Surgery)

## 2020-11-14 ENCOUNTER — Other Ambulatory Visit: Payer: Self-pay

## 2020-11-14 DIAGNOSIS — J9811 Atelectasis: Secondary | ICD-10-CM | POA: Diagnosis not present

## 2020-11-14 DIAGNOSIS — Z7984 Long term (current) use of oral hypoglycemic drugs: Secondary | ICD-10-CM | POA: Diagnosis not present

## 2020-11-14 DIAGNOSIS — Z7982 Long term (current) use of aspirin: Secondary | ICD-10-CM | POA: Diagnosis not present

## 2020-11-14 DIAGNOSIS — I10 Essential (primary) hypertension: Secondary | ICD-10-CM | POA: Diagnosis not present

## 2020-11-14 DIAGNOSIS — J811 Chronic pulmonary edema: Secondary | ICD-10-CM | POA: Diagnosis not present

## 2020-11-14 DIAGNOSIS — R911 Solitary pulmonary nodule: Secondary | ICD-10-CM

## 2020-11-14 DIAGNOSIS — Z01818 Encounter for other preprocedural examination: Secondary | ICD-10-CM | POA: Insufficient documentation

## 2020-11-14 DIAGNOSIS — Z9889 Other specified postprocedural states: Secondary | ICD-10-CM | POA: Diagnosis not present

## 2020-11-14 DIAGNOSIS — Z4682 Encounter for fitting and adjustment of non-vascular catheter: Secondary | ICD-10-CM | POA: Diagnosis not present

## 2020-11-14 DIAGNOSIS — F17210 Nicotine dependence, cigarettes, uncomplicated: Secondary | ICD-10-CM | POA: Diagnosis not present

## 2020-11-14 DIAGNOSIS — R918 Other nonspecific abnormal finding of lung field: Secondary | ICD-10-CM | POA: Diagnosis not present

## 2020-11-14 DIAGNOSIS — I5030 Unspecified diastolic (congestive) heart failure: Secondary | ICD-10-CM | POA: Diagnosis not present

## 2020-11-14 DIAGNOSIS — J939 Pneumothorax, unspecified: Secondary | ICD-10-CM | POA: Diagnosis not present

## 2020-11-14 DIAGNOSIS — Z79899 Other long term (current) drug therapy: Secondary | ICD-10-CM | POA: Diagnosis not present

## 2020-11-14 DIAGNOSIS — K6389 Other specified diseases of intestine: Secondary | ICD-10-CM | POA: Diagnosis not present

## 2020-11-14 DIAGNOSIS — Z794 Long term (current) use of insulin: Secondary | ICD-10-CM | POA: Diagnosis not present

## 2020-11-14 DIAGNOSIS — Z7951 Long term (current) use of inhaled steroids: Secondary | ICD-10-CM | POA: Diagnosis not present

## 2020-11-14 DIAGNOSIS — D62 Acute posthemorrhagic anemia: Secondary | ICD-10-CM | POA: Diagnosis not present

## 2020-11-14 DIAGNOSIS — Z9989 Dependence on other enabling machines and devices: Secondary | ICD-10-CM | POA: Diagnosis not present

## 2020-11-14 DIAGNOSIS — R0602 Shortness of breath: Secondary | ICD-10-CM | POA: Diagnosis not present

## 2020-11-14 DIAGNOSIS — E119 Type 2 diabetes mellitus without complications: Secondary | ICD-10-CM | POA: Diagnosis not present

## 2020-11-14 DIAGNOSIS — I9789 Other postprocedural complications and disorders of the circulatory system, not elsewhere classified: Secondary | ICD-10-CM | POA: Diagnosis not present

## 2020-11-14 DIAGNOSIS — Z452 Encounter for adjustment and management of vascular access device: Secondary | ICD-10-CM | POA: Diagnosis not present

## 2020-11-14 DIAGNOSIS — R140 Abdominal distension (gaseous): Secondary | ICD-10-CM | POA: Diagnosis not present

## 2020-11-14 DIAGNOSIS — J984 Other disorders of lung: Secondary | ICD-10-CM | POA: Diagnosis not present

## 2020-11-14 DIAGNOSIS — K219 Gastro-esophageal reflux disease without esophagitis: Secondary | ICD-10-CM | POA: Diagnosis not present

## 2020-11-14 DIAGNOSIS — C3432 Malignant neoplasm of lower lobe, left bronchus or lung: Secondary | ICD-10-CM | POA: Diagnosis not present

## 2020-11-14 DIAGNOSIS — G4730 Sleep apnea, unspecified: Secondary | ICD-10-CM | POA: Diagnosis not present

## 2020-11-14 DIAGNOSIS — J9382 Other air leak: Secondary | ICD-10-CM | POA: Diagnosis not present

## 2020-11-14 DIAGNOSIS — I2510 Atherosclerotic heart disease of native coronary artery without angina pectoris: Secondary | ICD-10-CM | POA: Diagnosis not present

## 2020-11-14 DIAGNOSIS — J449 Chronic obstructive pulmonary disease, unspecified: Secondary | ICD-10-CM | POA: Diagnosis not present

## 2020-11-14 DIAGNOSIS — I878 Other specified disorders of veins: Secondary | ICD-10-CM | POA: Diagnosis not present

## 2020-11-14 DIAGNOSIS — J90 Pleural effusion, not elsewhere classified: Secondary | ICD-10-CM | POA: Diagnosis not present

## 2020-11-14 DIAGNOSIS — J439 Emphysema, unspecified: Secondary | ICD-10-CM | POA: Diagnosis not present

## 2020-11-14 DIAGNOSIS — Z953 Presence of xenogenic heart valve: Secondary | ICD-10-CM | POA: Diagnosis not present

## 2020-11-14 DIAGNOSIS — I517 Cardiomegaly: Secondary | ICD-10-CM | POA: Diagnosis not present

## 2020-11-14 DIAGNOSIS — Z7902 Long term (current) use of antithrombotics/antiplatelets: Secondary | ICD-10-CM | POA: Diagnosis not present

## 2020-11-14 DIAGNOSIS — D696 Thrombocytopenia, unspecified: Secondary | ICD-10-CM | POA: Diagnosis not present

## 2020-11-14 DIAGNOSIS — E785 Hyperlipidemia, unspecified: Secondary | ICD-10-CM | POA: Diagnosis not present

## 2020-11-14 DIAGNOSIS — J9819 Other pulmonary collapse: Secondary | ICD-10-CM | POA: Diagnosis not present

## 2020-11-14 DIAGNOSIS — I5032 Chronic diastolic (congestive) heart failure: Secondary | ICD-10-CM | POA: Diagnosis not present

## 2020-11-14 LAB — TYPE AND SCREEN
ABO/RH(D): O POS
Antibody Screen: NEGATIVE

## 2020-11-14 LAB — BLOOD GAS, ARTERIAL
Acid-Base Excess: 1.9 mmol/L (ref 0.0–2.0)
Bicarbonate: 25.8 mmol/L (ref 20.0–28.0)
Drawn by: 58793
FIO2: 21
O2 Saturation: 95.4 %
Patient temperature: 37
pCO2 arterial: 39.4 mmHg (ref 32.0–48.0)
pH, Arterial: 7.432 (ref 7.350–7.450)
pO2, Arterial: 80.8 mmHg — ABNORMAL LOW (ref 83.0–108.0)

## 2020-11-14 LAB — SURGICAL PCR SCREEN
MRSA, PCR: NEGATIVE
Staphylococcus aureus: NEGATIVE

## 2020-11-14 LAB — HEMOGLOBIN A1C
Hgb A1c MFr Bld: 6.9 % — ABNORMAL HIGH (ref 4.8–5.6)
Mean Plasma Glucose: 151.33 mg/dL

## 2020-11-14 LAB — GLUCOSE, CAPILLARY: Glucose-Capillary: 160 mg/dL — ABNORMAL HIGH (ref 70–99)

## 2020-11-14 NOTE — Progress Notes (Signed)
Anesthesia Chart Review:  Pertinent history includes HFpEF, severe AS s/p TAVR 04/01/2020, aortic atherosclerosis, HTN, HLD, PAD and s/p stent to GDA and hepatic arteries after common and proper hepatic arteries were found dissected and occluded 07/2018, COPD, current tobacco use, obesity, sleep apnea on CPAP, prostate CA s/p [place] therapy in [place], h/o pancreatic mass / IPMN s/p Whipple procedure 07/20/2018.  He was seen by cardiology 11/07/2020 for preop evaluation. It was discussed the patient had self discontinued Plavix on 11/05/2020 due to BRBPR. It was felt acceptable for the patient to stay off of Plavix given that he was greater than 6 months s/p TAVR. Preop evaluation per note: "--Reports shortness of breath and dyspnea in the setting of pulmonary nodule concerning for primary bronchogenic carcinoma with upcoming 11/17/2020 left lower lobectomy scheduled.  He has NYHA class I - II symptoms, reporting these vary depending on the day.  He is s/p TAVR and EKG today without significant arrhythmia.  He does not appear significantly volume overloaded on exam.  No recent MI or symptoms of unstable or severe angina.  He has history of CKD but with creatinine under 2.0.  Clinical risk factors include history of congenital heart failure, diabetes on insulin.  Functional capacity is poor at 1-4 METS.  Surgery specific risk is intermediate to high at approximately 5% risk.  Most recent echo 04/2020 with EF normal as above and mild PVL but without significant valvular disease. RCRI calculated score of 3 or Class IV risk with calculated 11% risk of MACE. Considered is repeat echo; however, his breathing difficulties are stable from previous visits and updating an echo will not change management at this time. Therefore, it is reasonable to proceed with left lower lobectomy without additional cardiac testing or intervention at this time.  As above, reasonable to proceed without further cardiac testing or  intervention at this time.  Increased Coreg for more optimal BP control. Recheck BP prior to surgery to ensure at goal.   Obtain BMET to recheck Cr <2.0. Obtain CBC given BRBPR as above.  Recommendation to discontinue clopidogrel 5 days prior to surgery discussed; however, he has already self discontinued this medication, thus we will not restart it at this time. ? Continue to hold Plavix as above - pt self discontinued on 11/05/20.  We will touch base with the TAVR team and vascular team (given hepatic stenting) to confirm restart after surgery is not indicated.  ? Continue ASA 81 mg daily during the perioperative period, unless otherwise indicated by the surgical team.  As above, we will confirm if Plavix is to be restarted after surgery and once felt safe to do so by the surgical team. Suspect we can discontinue Plavix indefinitely but will confirm with both TAVR team and vascular team."  DM2 last A1c 6.3 on 09/11/2020.  EKG 11/07/2020: NSR. First-degree AV block. LAFB. IVCD. Inferior infarct, age undetermined. Possible anterior infarct, age undetermined. No acute changes from previous.  PFT 10/28/2020: FVC-%Pred-Pre % 61 P   FEV1-%Pred-Pre % 56 P   FEV1FVC-%Pred-Pre % 90 P   RV % pred % 115 P   TLC % pred % 80 P   DLCO unc % pred % 57 P    CT Chest 09/16/20: IMPRESSION: 1. Interval resolution of bibasilar atelectasis. 2. Stable appearance of left lower lobe lung nodule measuring 1.5 cm. Cannot exclude primary bronchogenic carcinoma. 3. Emphysema and aortic atherosclerosis. 4. Coronary artery calcifications noted.  Echo 05/01/20 1. Left ventricular ejection fraction, by estimation,  is 60 to 65%. The  left ventricle has normal function. The left ventricle has no regional  wall motion abnormalities. There is mild left ventricular hypertrophy.  Left ventricular diastolic parameters  are consistent with Grade I diastolic dysfunction (impaired relaxation).  2. Right ventricular  systolic function is normal. The right ventricular  size is normal.  3. The mitral valve is normal in structure. Trivial mitral valve  regurgitation. No evidence of mitral stenosis.  4. Post TAVR 04/01/20 with 29 mm Sapien 3 valve. Gradients have increased  since day one post op 04/02/20 mean 14 mm -> 16 mmHg , Peak 28 mm _> 31  mmHg There is a mild PVL that is more apparent than day one post op echo .  The aortic valve has been  repaired/replaced. Aortic valve regurgitation is mild. No aortic stenosis  is present. Procedure Date: 04/01/2020.  5. The inferior vena cava is normal in size with greater than 50%  respiratory variability, suggesting right atrial pressure of 3 mmHg.   Carotid US 03/11/20 Summary:  Right Carotid: Velocities in the right ICA are consistent with a 1-39%  stenosis.  Left Carotid: Velocities in the left ICA are consistent with a 1-39%  stenosis.  Vertebrals: Bilateral vertebral arteries demonstrate antegrade flow.  Subclavians: Normal flow hemodynamics were seen in bilateral subclavian        arteries.   [facility] 02/21/20  The left ventricular ejection fraction is 55-65% by visual estimate.  LV end diastolic pressure is normal.  The left ventricular systolic function is normal.  There is severe aortic valve stenosis.   Wynonia Musty [facility] Short Stay Center/Anesthesiology Phone [phone] 11/14/2020 4:14 PM

## 2020-11-14 NOTE — Progress Notes (Incomplete)
PCP -  Cardiologist -   PPM/ICD -  Device Orders -  Rep Notified -   Chest x-ray -  EKG -  Stress Test -  ECHO -  Cardiac Cath -   Sleep Study -  CPAP -   Fasting Blood Sugar -  Checks Blood Sugar _____ times a day  Blood Thinner Instructions: Aspirin Instructions:  ERAS Protcol - PRE-SURGERY Ensure or G2-   COVID TEST-    Anesthesia review:   Patient denies shortness of breath, fever, cough and chest pain at PAT appointment   All instructions explained to the patient, with a verbal understanding of the material. Patient agrees to go over the instructions while at home for a better understanding. Patient also instructed to self quarantine after being tested for COVID-19. The opportunity to ask questions was provided.   

## 2020-11-14 NOTE — Anesthesia Preprocedure Evaluation (Addendum)
Anesthesia Evaluation  Patient identified by MRN, date of birth, ID band Patient awake    Reviewed: Allergy & Precautions, NPO status , Patient's Chart, lab work & pertinent test results  Airway Mallampati: II  TM Distance: >3 FB Neck ROM: Full    Dental  (+) Dental Advisory Given, Edentulous Upper, Edentulous Lower   Pulmonary sleep apnea , COPD, Current Smoker,    Pulmonary exam normal breath sounds clear to auscultation       Cardiovascular hypertension, Pt. on medications and Pt. on home beta blockers + CAD  Normal cardiovascular exam+ Valvular Problems/Murmurs AS  Rhythm:Regular Rate:Normal  Echo 04/2020  1. Left ventricular ejection fraction, by estimation, is 60 to 65%. The left ventricle has normal function. The left ventricle has no regional wall motion abnormalities. There is mild left ventricular hypertrophy. Left ventricular diastolic parameters are consistent with Grade I diastolic dysfunction (impaired relaxation).  2. Right ventricular systolic function is normal. The right ventricular size is normal.  3. The mitral valve is normal in structure. Trivial mitral valve regurgitation. No evidence of mitral stenosis.  4. Post TAVR 04/01/20 with 29 mm Sapien 3 valve. Gradients have increased since day one post op 04/02/20 mean 14 mm -> 16 mmHg, Peak 28 mm _> 31 mmHg There is a mild PVL that is more apparent than day one post op echo. The aortic valve has been repaired/replaced. Aortic valve regurgitation is mild. No aortic stenosis is present. Procedure Date: 04/01/2020.  5. The inferior vena cava is normal in size with greater than 50% respiratory variability, suggesting right atrial pressure of 3 mmHg.    Neuro/Psych PSYCHIATRIC DISORDERS Depression  Neuromuscular disease    GI/Hepatic Neg liver ROS, GERD  ,  Endo/Other  diabetes  Renal/GU negative Renal ROS     Musculoskeletal  (+) Arthritis ,   Abdominal    Peds  Hematology negative hematology ROS (+)   Anesthesia Other Findings   Reproductive/Obstetrics                                                            Anesthesia Evaluation  Patient identified by MRN, date of birth, ID band Patient awake    Reviewed: Allergy & Precautions, H&P , NPO status , Patient's Chart, lab work & pertinent test results, reviewed documented beta blocker date and time   Airway Mallampati: III  TM Distance: >3 FB Neck ROM: Full    Dental no notable dental hx. (+) Edentulous Upper, Edentulous Lower, Dental Advisory Given   Pulmonary sleep apnea and Continuous Positive Airway Pressure Ventilation , COPD,  COPD inhaler, Current SmokerPatient did not abstain from smoking.,    Pulmonary exam normal breath sounds clear to auscultation       Cardiovascular hypertension, Pt. on medications and Pt. on home beta blockers +CHF  + Valvular Problems/Murmurs AS  Rhythm:Regular Rate:Normal + Systolic murmurs    Neuro/Psych Depression negative neurological ROS     GI/Hepatic Neg liver ROS, GERD  Medicated,  Endo/Other  diabetes, Insulin Dependent, Oral Hypoglycemic Agents  Renal/GU negative Renal ROS  negative genitourinary   Musculoskeletal  (+) Arthritis , Osteoarthritis,    Abdominal   Peds  Hematology negative hematology ROS (+)   Anesthesia Other Findings   Reproductive/Obstetrics negative OB ROS  Anesthesia Physical Anesthesia Plan  ASA: IV  Anesthesia Plan: MAC   Post-op Pain Management:    Induction: Intravenous  PONV Risk Score and Plan: 1 and Propofol infusion and Ondansetron  Airway Management Planned: Simple Face Mask  Additional Equipment: Arterial line  Intra-op Plan:   Post-operative Plan:   Informed Consent: I have reviewed the patients History and Physical, chart, labs and discussed the procedure including the risks, benefits  and alternatives for the proposed anesthesia with the patient or authorized representative who has indicated his/her understanding and acceptance.     Dental advisory given  Plan Discussed with: CRNA  Anesthesia Plan Comments:         Anesthesia Quick Evaluation  Anesthesia Physical Anesthesia Plan  ASA: III  Anesthesia Plan: General   Post-op Pain Management:    Induction: Intravenous  PONV Risk Score and Plan: 2 and Ondansetron, Dexamethasone, Treatment may vary due to age or medical condition and Diphenhydramine  Airway Management Planned: Double Lumen EBT  Additional Equipment: Arterial line  Intra-op Plan:   Post-operative Plan: Possible Post-op intubation/ventilation  Informed Consent: I have reviewed the patients History and Physical, chart, labs and discussed the procedure including the risks, benefits and alternatives for the proposed anesthesia with the patient or authorized representative who has indicated his/her understanding and acceptance.     Dental advisory given  Plan Discussed with:   Anesthesia Plan Comments: (2 x PIV   PAT note by Karoline Caldwell, PA-C: Pertinent history includes HFpEF, severe AS s/p TAVR 04/01/2020, aortic atherosclerosis, HTN, HLD, PAD and s/p stent to GDA and hepatic arteries after common and proper hepatic arteries were found dissected and occluded 07/2018, COPD, current tobacco use, obesity, sleep apnea on CPAP, prostate CA s/p radiation therapy in [place], h/o pancreatic mass / IPMN s/p Whipple procedure 07/20/2018.  He was seen by cardiology 11/07/2020 for preop evaluation. It was discussed the patient had self discontinued Plavix on 11/05/2020 due to BRBPR. It was felt acceptable for the patient to stay off of Plavix given that he was greater than 6 months s/p TAVR. Preop evaluation per note: "--Reports shortness of breath and dyspnea in the setting of pulmonary nodule concerning for primary bronchogenic carcinoma with  upcoming 11/17/2020 left lower lobectomy scheduled.  He has NYHA class I - II symptoms, reporting these vary depending on the day.  He is s/p TAVR and EKG today without significant arrhythmia.  He does not appear significantly volume overloaded on exam.  No recent MI or symptoms of unstable or severe angina.  He has history of CKD but with creatinine under 2.0.  Clinical risk factors include history of congenital heart failure, diabetes on insulin.  Functional capacity is poor at 1-4 METS.  Surgery specific risk is intermediate to high at approximately 5% risk.  Most recent echo 04/2020 with EF normal as above and mild PVL but without significant valvular disease. RCRI calculated score of 3 or Class IV risk with calculated 11% risk of MACE. Considered is repeat echo; however, his breathing difficulties are stable from previous visits and updating an echo will not change management at this time. Therefore, it is reasonable to proceed with left lower lobectomy without additional cardiac testing or intervention at this time. As above, reasonable to proceed without further cardiac testing or intervention at this time. Increased Coreg for more optimal BP control. Recheck BP prior to surgery to ensure at goal.  Obtain BMET to recheck Cr <2.0. Obtain CBC given BRBPR as above. Recommendation  to discontinue clopidogrel 5 days prior to surgery discussed; however, he has already self discontinued this medication, thus we will not restart it at this time. Continue to hold Plavix as above - pt self discontinued on 11/05/20. We will touch base with the TAVR team and vascular team (given hepatic stenting) to confirm restart after surgery is not indicated.  Continue ASA 81 mg daily during the perioperative period, unless otherwise indicated by the surgical team. As above, we will confirm if Plavix is to be restarted after surgery and once felt safe to do so by the surgical team. Suspect we can discontinue Plavix indefinitely  but will confirm with both TAVR team and vascular team."  DM2 last A1c 6.3 on 09/11/2020.  EKG 11/07/2020: NSR. First-degree AV block. LAFB. IVCD. Inferior infarct, age undetermined. Possible anterior infarct, age undetermined. No acute changes from previous.  PFT 10/28/2020: FVC-%Pred-Pre % 61 P  FEV1-%Pred-Pre % 56 P  FEV1FVC-%Pred-Pre % 90 P  RV % pred % 115 P  TLC % pred % 80 P  DLCO unc % pred % 57 P   CT Chest 09/16/20: IMPRESSION: 1. Interval resolution of bibasilar atelectasis. 2. Stable appearance of left lower lobe lung nodule measuring 1.5 cm. Cannot exclude primary bronchogenic carcinoma. 3. Emphysema and aortic atherosclerosis. 4. Coronary artery calcifications noted.  Echo 05/01/20 1. Left ventricular ejection fraction, by estimation, is 60 to 65%. The  left ventricle has normal function. The left ventricle has no regional  wall motion abnormalities. There is mild left ventricular hypertrophy.  Left ventricular diastolic parameters  are consistent with Grade I diastolic dysfunction (impaired relaxation).  2. Right ventricular systolic function is normal. The right ventricular  size is normal.  3. The mitral valve is normal in structure. Trivial mitral valve  regurgitation. No evidence of mitral stenosis.  4. Post TAVR 04/01/20 with 29 mm Sapien 3 valve. Gradients have increased  since day one post op 04/02/20 mean 14 mm -> 16 mmHg , Peak 28 mm _> 31  mmHg There is a mild PVL that is more apparent than day one post op echo .  The aortic valve has been  repaired/replaced. Aortic valve regurgitation is mild. No aortic stenosis  is present. Procedure Date: 04/01/2020.  5. The inferior vena cava is normal in size with greater than 50%  respiratory variability, suggesting right atrial pressure of 3 mmHg.   Carotid US 03/11/20 Summary:  Right Carotid: Velocities in the right ICA are consistent with a 1-39%  stenosis.  Left Carotid: Velocities in the left ICA are  consistent with a 1-39%  stenosis.  Vertebrals: Bilateral vertebral arteries demonstrate antegrade flow.  Subclavians: Normal flow hemodynamics were seen in bilateral subclavian        arteries.   [facility] 02/21/20 The left ventricular ejection fraction is 55-65% by visual estimate. LV end diastolic pressure is normal. The left ventricular systolic function is normal. There is severe aortic valve stenosis.  )      Anesthesia Quick Evaluation

## 2020-11-14 NOTE — Progress Notes (Signed)
PCP - Cyndia Skeeters FNP Cardiologist - Dr. Ida Rogue Pulmonologist: Dr. Vernard Gambles Oncologist: Dr. Randa Evens  PPM/ICD - Denies  Chest x-ray - 11/14/20 EKG - 11/07/20 Stress Test - Denies ECHO - 05/01/20 Cardiac Cath - 02/21/20  Sleep Study - Yes CPAP - No  Fasting Blood Sugar - 90's Checks Blood Sugar __2___ times a day  Blood Thinner Instructions: LD Plavix 11/10/20 Aspirin Instructions: Pt told to call surgeon's office for instructions.  ERAS Protcol - No  COVID TEST- Negative 11/13/20   Anesthesia review: Yes, cardiac hx; clearance in Epic 11/07/20  Patient denies shortness of breath, fever, cough and chest pain at PAT appointment   All instructions explained to the patient, with a verbal understanding of the material. Patient agrees to go over the instructions while at home for a better understanding. Patient also instructed to self quarantine after being tested for COVID-19. The opportunity to ask questions was provided.

## 2020-11-17 ENCOUNTER — Inpatient Hospital Stay (HOSPITAL_COMMUNITY)
Admission: RE | Admit: 2020-11-17 | Discharge: 2020-11-23 | DRG: 164 | Disposition: A | Discharge status: Home / Self Care | Payer: Medicare Other | Attending: Thoracic Surgery (Cardiothoracic Vascular Surgery) | Admitting: Thoracic Surgery (Cardiothoracic Vascular Surgery)

## 2020-11-17 ENCOUNTER — Other Ambulatory Visit: Payer: Self-pay

## 2020-11-17 ENCOUNTER — Inpatient Hospital Stay (HOSPITAL_COMMUNITY): Payer: Medicare Other | Admitting: Certified Registered Nurse Anesthetist

## 2020-11-17 ENCOUNTER — Encounter (HOSPITAL_COMMUNITY): Payer: Self-pay | Admitting: Thoracic Surgery (Cardiothoracic Vascular Surgery)

## 2020-11-17 ENCOUNTER — Inpatient Hospital Stay (HOSPITAL_COMMUNITY): Payer: Medicare Other

## 2020-11-17 ENCOUNTER — Encounter (HOSPITAL_COMMUNITY)
Admission: RE | Disposition: A | Discharge status: Home / Self Care | Payer: Self-pay | Source: Home / Self Care | Attending: Thoracic Surgery (Cardiothoracic Vascular Surgery)

## 2020-11-17 DIAGNOSIS — Z7984 Long term (current) use of oral hypoglycemic drugs: Secondary | ICD-10-CM

## 2020-11-17 DIAGNOSIS — I10 Essential (primary) hypertension: Secondary | ICD-10-CM | POA: Diagnosis not present

## 2020-11-17 DIAGNOSIS — C3432 Malignant neoplasm of lower lobe, left bronchus or lung: Secondary | ICD-10-CM

## 2020-11-17 DIAGNOSIS — D62 Acute posthemorrhagic anemia: Secondary | ICD-10-CM | POA: Diagnosis not present

## 2020-11-17 DIAGNOSIS — Z7982 Long term (current) use of aspirin: Secondary | ICD-10-CM | POA: Diagnosis not present

## 2020-11-17 DIAGNOSIS — I5032 Chronic diastolic (congestive) heart failure: Secondary | ICD-10-CM | POA: Diagnosis not present

## 2020-11-17 DIAGNOSIS — E119 Type 2 diabetes mellitus without complications: Secondary | ICD-10-CM | POA: Diagnosis present

## 2020-11-17 DIAGNOSIS — J431 Panlobular emphysema: Secondary | ICD-10-CM

## 2020-11-17 DIAGNOSIS — J449 Chronic obstructive pulmonary disease, unspecified: Secondary | ICD-10-CM | POA: Diagnosis not present

## 2020-11-17 DIAGNOSIS — D696 Thrombocytopenia, unspecified: Secondary | ICD-10-CM | POA: Diagnosis not present

## 2020-11-17 DIAGNOSIS — G4730 Sleep apnea, unspecified: Secondary | ICD-10-CM | POA: Diagnosis present

## 2020-11-17 DIAGNOSIS — Z953 Presence of xenogenic heart valve: Secondary | ICD-10-CM | POA: Diagnosis not present

## 2020-11-17 DIAGNOSIS — I9789 Other postprocedural complications and disorders of the circulatory system, not elsewhere classified: Secondary | ICD-10-CM | POA: Diagnosis not present

## 2020-11-17 DIAGNOSIS — Z7951 Long term (current) use of inhaled steroids: Secondary | ICD-10-CM

## 2020-11-17 DIAGNOSIS — J9811 Atelectasis: Secondary | ICD-10-CM | POA: Diagnosis not present

## 2020-11-17 DIAGNOSIS — Z9889 Other specified postprocedural states: Secondary | ICD-10-CM

## 2020-11-17 DIAGNOSIS — J9382 Other air leak: Secondary | ICD-10-CM | POA: Diagnosis not present

## 2020-11-17 DIAGNOSIS — F17210 Nicotine dependence, cigarettes, uncomplicated: Secondary | ICD-10-CM | POA: Diagnosis not present

## 2020-11-17 DIAGNOSIS — I878 Other specified disorders of veins: Secondary | ICD-10-CM | POA: Diagnosis not present

## 2020-11-17 DIAGNOSIS — Z09 Encounter for follow-up examination after completed treatment for conditions other than malignant neoplasm: Secondary | ICD-10-CM

## 2020-11-17 DIAGNOSIS — Z794 Long term (current) use of insulin: Secondary | ICD-10-CM | POA: Diagnosis not present

## 2020-11-17 DIAGNOSIS — K219 Gastro-esophageal reflux disease without esophagitis: Secondary | ICD-10-CM | POA: Diagnosis present

## 2020-11-17 DIAGNOSIS — Z79899 Other long term (current) drug therapy: Secondary | ICD-10-CM

## 2020-11-17 DIAGNOSIS — Z4682 Encounter for fitting and adjustment of non-vascular catheter: Secondary | ICD-10-CM

## 2020-11-17 DIAGNOSIS — Z902 Acquired absence of lung [part of]: Secondary | ICD-10-CM

## 2020-11-17 DIAGNOSIS — Z7902 Long term (current) use of antithrombotics/antiplatelets: Secondary | ICD-10-CM | POA: Diagnosis not present

## 2020-11-17 DIAGNOSIS — R911 Solitary pulmonary nodule: Secondary | ICD-10-CM

## 2020-11-17 DIAGNOSIS — J939 Pneumothorax, unspecified: Secondary | ICD-10-CM

## 2020-11-17 HISTORY — PX: NODE DISSECTION: SHX5269

## 2020-11-17 HISTORY — PX: INTERCOSTAL NERVE BLOCK: SHX5021

## 2020-11-17 LAB — GLUCOSE, CAPILLARY
Glucose-Capillary: 146 mg/dL — ABNORMAL HIGH (ref 70–99)
Glucose-Capillary: 150 mg/dL — ABNORMAL HIGH (ref 70–99)
Glucose-Capillary: 154 mg/dL — ABNORMAL HIGH (ref 70–99)
Glucose-Capillary: 214 mg/dL — ABNORMAL HIGH (ref 70–99)
Glucose-Capillary: 257 mg/dL — ABNORMAL HIGH (ref 70–99)

## 2020-11-17 SURGERY — LOBECTOMY, LUNG, ROBOT-ASSISTED, USING VATS
Anesthesia: General | Site: Chest | Laterality: Left

## 2020-11-17 MED ORDER — MIDAZOLAM HCL 2 MG/2ML IJ SOLN
INTRAMUSCULAR | Status: AC
  Filled 2020-11-17: qty 2

## 2020-11-17 MED ORDER — FLUTICASONE FUROATE-VILANTEROL 100-25 MCG/INH IN AEPB
1.0000 | INHALATION_SPRAY | Freq: Every day | RESPIRATORY_TRACT | Status: DC
  Administered 2020-11-19 – 2020-11-22 (×4): 1 via RESPIRATORY_TRACT
  Filled 2020-11-17 (×3): qty 28

## 2020-11-17 MED ORDER — FENTANYL CITRATE (PF) 250 MCG/5ML IJ SOLN
INTRAMUSCULAR | Status: AC
  Filled 2020-11-17: qty 5

## 2020-11-17 MED ORDER — LIDOCAINE 2% (20 MG/ML) 5 ML SYRINGE
INTRAMUSCULAR | Status: AC
  Filled 2020-11-17: qty 5

## 2020-11-17 MED ORDER — ASPIRIN EC 81 MG PO TBEC
81.0000 mg | DELAYED_RELEASE_TABLET | Freq: Every day | ORAL | Status: DC
  Administered 2020-11-18 – 2020-11-23 (×5): 81 mg via ORAL
  Filled 2020-11-17 (×6): qty 1

## 2020-11-17 MED ORDER — MIDAZOLAM HCL 5 MG/5ML IJ SOLN
INTRAMUSCULAR | Status: DC | PRN
  Administered 2020-11-17: 1.75 mg via INTRAVENOUS
  Administered 2020-11-17: .25 mg via INTRAVENOUS

## 2020-11-17 MED ORDER — ALBUMIN HUMAN 5 % IV SOLN: INTRAVENOUS | Status: DC | PRN

## 2020-11-17 MED ORDER — ACETAMINOPHEN 500 MG PO TABS
1000.0000 mg | ORAL_TABLET | Freq: Four times a day (QID) | ORAL | Status: AC
  Administered 2020-11-17 – 2020-11-22 (×19): 1000 mg via ORAL
  Filled 2020-11-17 (×18): qty 2

## 2020-11-17 MED ORDER — CHLORHEXIDINE GLUCONATE 0.12 % MT SOLN
15.0000 mL | Freq: Once | OROMUCOSAL | Status: AC
  Administered 2020-11-17: 15 mL via OROMUCOSAL
  Filled 2020-11-17: qty 15

## 2020-11-17 MED ORDER — FENTANYL CITRATE (PF) 100 MCG/2ML IJ SOLN
25.0000 ug | INTRAMUSCULAR | Status: DC | PRN
  Administered 2020-11-18: 50 ug via INTRAVENOUS
  Filled 2020-11-17: qty 2

## 2020-11-17 MED ORDER — PROPOFOL 10 MG/ML IV BOLUS
INTRAVENOUS | Status: AC
  Filled 2020-11-17: qty 20

## 2020-11-17 MED ORDER — ALBUTEROL SULFATE (2.5 MG/3ML) 0.083% IN NEBU: 2.5000 mg | INHALATION_SOLUTION | Freq: Four times a day (QID) | RESPIRATORY_TRACT | Status: DC | PRN

## 2020-11-17 MED ORDER — FENTANYL CITRATE (PF) 100 MCG/2ML IJ SOLN
25.0000 ug | INTRAMUSCULAR | Status: DC | PRN
  Administered 2020-11-17 (×2): 25 ug via INTRAVENOUS

## 2020-11-17 MED ORDER — PROPOFOL 10 MG/ML IV BOLUS
INTRAVENOUS | Status: DC | PRN
  Administered 2020-11-17: 100 mg via INTRAVENOUS

## 2020-11-17 MED ORDER — SODIUM CHLORIDE 0.9 % IV SOLN
INTRAVENOUS | Status: DC | PRN
  Administered 2020-11-17: 30 ug/min via INTRAVENOUS

## 2020-11-17 MED ORDER — SENNOSIDES-DOCUSATE SODIUM 8.6-50 MG PO TABS
1.0000 | ORAL_TABLET | Freq: Every day | ORAL | Status: DC
  Administered 2020-11-17 – 2020-11-21 (×5): 1 via ORAL
  Filled 2020-11-17 (×6): qty 1

## 2020-11-17 MED ORDER — ALBUTEROL SULFATE HFA 108 (90 BASE) MCG/ACT IN AERS
2.0000 | INHALATION_SPRAY | Freq: Four times a day (QID) | RESPIRATORY_TRACT | Status: DC | PRN
  Filled 2020-11-17: qty 6.7

## 2020-11-17 MED ORDER — ROCURONIUM BROMIDE 10 MG/ML (PF) SYRINGE
PREFILLED_SYRINGE | INTRAVENOUS | Status: DC | PRN
  Administered 2020-11-17: 20 mg via INTRAVENOUS
  Administered 2020-11-17: 30 mg via INTRAVENOUS
  Administered 2020-11-17: 70 mg via INTRAVENOUS

## 2020-11-17 MED ORDER — CLONIDINE HCL 0.1 MG PO TABS
0.1000 mg | ORAL_TABLET | Freq: Two times a day (BID) | ORAL | Status: DC
Start: 2020-11-18 — End: 2020-11-20
  Administered 2020-11-18 – 2020-11-19 (×3): 0.1 mg via ORAL
  Filled 2020-11-17 (×4): qty 1

## 2020-11-17 MED ORDER — BISACODYL 5 MG PO TBEC
10.0000 mg | DELAYED_RELEASE_TABLET | Freq: Every day | ORAL | Status: DC
  Administered 2020-11-18 – 2020-11-22 (×4): 10 mg via ORAL
  Filled 2020-11-17 (×6): qty 2

## 2020-11-17 MED ORDER — AMISULPRIDE (ANTIEMETIC) 5 MG/2ML IV SOLN: 10.0000 mg | Freq: Once | INTRAVENOUS | Status: DC | PRN

## 2020-11-17 MED ORDER — DEXAMETHASONE SODIUM PHOSPHATE 4 MG/ML IJ SOLN
INTRAMUSCULAR | Status: DC | PRN
  Administered 2020-11-17: 4 mg via INTRAVENOUS

## 2020-11-17 MED ORDER — EPHEDRINE SULFATE 50 MG/ML IJ SOLN
INTRAMUSCULAR | Status: DC | PRN
  Administered 2020-11-17: 5 mg via INTRAVENOUS
  Administered 2020-11-17: 10 mg via INTRAVENOUS

## 2020-11-17 MED ORDER — TRAZODONE HCL 50 MG PO TABS
100.0000 mg | ORAL_TABLET | Freq: Every day | ORAL | Status: DC
  Administered 2020-11-17 – 2020-11-22 (×6): 100 mg via ORAL
  Filled 2020-11-17 (×6): qty 2

## 2020-11-17 MED ORDER — PANTOPRAZOLE SODIUM 40 MG PO TBEC
40.0000 mg | DELAYED_RELEASE_TABLET | Freq: Two times a day (BID) | ORAL | Status: DC
  Administered 2020-11-17 – 2020-11-23 (×11): 40 mg via ORAL
  Filled 2020-11-17 (×12): qty 1

## 2020-11-17 MED ORDER — ONDANSETRON HCL 4 MG/2ML IJ SOLN
INTRAMUSCULAR | Status: DC | PRN
  Administered 2020-11-17: 4 mg via INTRAVENOUS

## 2020-11-17 MED ORDER — ONDANSETRON HCL 4 MG/2ML IJ SOLN
INTRAMUSCULAR | Status: AC
  Filled 2020-11-17: qty 2

## 2020-11-17 MED ORDER — EZETIMIBE 10 MG PO TABS
10.0000 mg | ORAL_TABLET | Freq: Every day | ORAL | Status: DC
  Administered 2020-11-18 – 2020-11-23 (×5): 10 mg via ORAL
  Filled 2020-11-17 (×7): qty 1

## 2020-11-17 MED ORDER — INSULIN GLARGINE 100 UNIT/ML ~~LOC~~ SOLN
20.0000 [IU] | Freq: Every day | SUBCUTANEOUS | Status: DC
  Administered 2020-11-18 – 2020-11-22 (×5): 20 [IU] via SUBCUTANEOUS
  Filled 2020-11-17 (×6): qty 0.2

## 2020-11-17 MED ORDER — METOCLOPRAMIDE HCL 5 MG/ML IJ SOLN
10.0000 mg | Freq: Four times a day (QID) | INTRAMUSCULAR | Status: AC
  Administered 2020-11-17 – 2020-11-18 (×4): 10 mg via INTRAVENOUS
  Filled 2020-11-17 (×4): qty 2

## 2020-11-17 MED ORDER — TRAMADOL HCL 50 MG PO TABS
50.0000 mg | ORAL_TABLET | Freq: Four times a day (QID) | ORAL | Status: DC | PRN
  Administered 2020-11-17: 50 mg via ORAL
  Administered 2020-11-18: 100 mg via ORAL
  Filled 2020-11-17 (×2): qty 2

## 2020-11-17 MED ORDER — ENOXAPARIN SODIUM 40 MG/0.4ML ~~LOC~~ SOLN
40.0000 mg | SUBCUTANEOUS | Status: DC
  Administered 2020-11-18 – 2020-11-23 (×5): 40 mg via SUBCUTANEOUS
  Filled 2020-11-17 (×6): qty 0.4

## 2020-11-17 MED ORDER — FENTANYL CITRATE (PF) 100 MCG/2ML IJ SOLN
INTRAMUSCULAR | Status: AC
  Filled 2020-11-17: qty 2

## 2020-11-17 MED ORDER — HEMOSTATIC AGENTS (NO CHARGE) OPTIME
TOPICAL | Status: DC | PRN
  Administered 2020-11-17: 1 via TOPICAL
  Administered 2020-11-17: 2 via TOPICAL

## 2020-11-17 MED ORDER — ATORVASTATIN CALCIUM 80 MG PO TABS
80.0000 mg | ORAL_TABLET | Freq: Every day | ORAL | Status: DC
  Administered 2020-11-18 – 2020-11-23 (×5): 80 mg via ORAL
  Filled 2020-11-17 (×5): qty 1

## 2020-11-17 MED ORDER — SODIUM CHLORIDE FLUSH 0.9 % IV SOLN
INTRAVENOUS | Status: DC | PRN
  Administered 2020-11-17: 90 mL

## 2020-11-17 MED ORDER — SUGAMMADEX SODIUM 500 MG/5ML IV SOLN
INTRAVENOUS | Status: AC
  Filled 2020-11-17: qty 5

## 2020-11-17 MED ORDER — FENTANYL CITRATE (PF) 100 MCG/2ML IJ SOLN
INTRAMUSCULAR | Status: DC | PRN
  Administered 2020-11-17 (×4): 50 ug via INTRAVENOUS

## 2020-11-17 MED ORDER — CHLORHEXIDINE GLUCONATE CLOTH 2 % EX PADS
6.0000 | MEDICATED_PAD | Freq: Every day | CUTANEOUS | Status: DC
  Administered 2020-11-18 – 2020-11-21 (×3): 6 via TOPICAL

## 2020-11-17 MED ORDER — SODIUM CHLORIDE 0.9% IV SOLUTION
INTRAVENOUS | Status: AC | PRN
  Administered 2020-11-17: 1000 mL via INTRAMUSCULAR

## 2020-11-17 MED ORDER — BUPIVACAINE LIPOSOME 1.3 % IJ SUSP
20.0000 mL | Freq: Once | INTRAMUSCULAR | Status: DC
  Filled 2020-11-17: qty 20

## 2020-11-17 MED ORDER — ORAL CARE MOUTH RINSE: 15.0000 mL | Freq: Once | OROMUCOSAL | Status: AC

## 2020-11-17 MED ORDER — SUGAMMADEX SODIUM 200 MG/2ML IV SOLN
INTRAVENOUS | Status: DC | PRN
  Administered 2020-11-17: 200 mg via INTRAVENOUS

## 2020-11-17 MED ORDER — CEFAZOLIN SODIUM-DEXTROSE 2-4 GM/100ML-% IV SOLN
2.0000 g | INTRAVENOUS | Status: DC
  Filled 2020-11-17: qty 100

## 2020-11-17 MED ORDER — TRAMADOL HCL 50 MG PO TABS
ORAL_TABLET | ORAL | Status: AC
  Filled 2020-11-17: qty 1

## 2020-11-17 MED ORDER — ACETAMINOPHEN 160 MG/5ML PO SOLN
1000.0000 mg | Freq: Four times a day (QID) | ORAL | Status: AC
  Administered 2020-11-19: 1000 mg via ORAL
  Filled 2020-11-17: qty 40.6

## 2020-11-17 MED ORDER — LIDOCAINE 2% (20 MG/ML) 5 ML SYRINGE
INTRAMUSCULAR | Status: DC | PRN
  Administered 2020-11-17: 100 mg via INTRAVENOUS

## 2020-11-17 MED ORDER — BUPIVACAINE HCL (PF) 0.5 % IJ SOLN
INTRAMUSCULAR | Status: AC
  Filled 2020-11-17: qty 30

## 2020-11-17 MED ORDER — DEXAMETHASONE SODIUM PHOSPHATE 10 MG/ML IJ SOLN
INTRAMUSCULAR | Status: AC
  Filled 2020-11-17: qty 1

## 2020-11-17 MED ORDER — BUDESON-GLYCOPYRROL-FORMOTEROL 160-9-4.8 MCG/ACT IN AERO: 2.0000 | INHALATION_SPRAY | Freq: Two times a day (BID) | RESPIRATORY_TRACT | Status: DC

## 2020-11-17 MED ORDER — GABAPENTIN 100 MG PO CAPS
200.0000 mg | ORAL_CAPSULE | Freq: Every day | ORAL | Status: DC
  Administered 2020-11-17 – 2020-11-22 (×6): 200 mg via ORAL
  Filled 2020-11-17 (×6): qty 2

## 2020-11-17 MED ORDER — LACTATED RINGERS IV SOLN: INTRAVENOUS | Status: DC

## 2020-11-17 MED ORDER — CEFAZOLIN SODIUM-DEXTROSE 2-4 GM/100ML-% IV SOLN
2.0000 g | Freq: Three times a day (TID) | INTRAVENOUS | Status: AC
  Administered 2020-11-17: 2 g via INTRAVENOUS
  Filled 2020-11-17 (×3): qty 100

## 2020-11-17 MED ORDER — INSULIN ASPART 100 UNIT/ML ~~LOC~~ SOLN
0.0000 [IU] | SUBCUTANEOUS | Status: DC
  Administered 2020-11-17: 12 [IU] via SUBCUTANEOUS
  Administered 2020-11-18: 8 [IU] via SUBCUTANEOUS
  Administered 2020-11-18: 2 [IU] via SUBCUTANEOUS
  Filled 2020-11-17: qty 0.24

## 2020-11-17 MED ORDER — SODIUM CHLORIDE 0.9 % IV SOLN: INTRAVENOUS | Status: DC

## 2020-11-17 MED ORDER — CARVEDILOL 6.25 MG PO TABS
6.2500 mg | ORAL_TABLET | Freq: Two times a day (BID) | ORAL | Status: DC
  Administered 2020-11-17 – 2020-11-23 (×12): 6.25 mg via ORAL
  Filled 2020-11-17 (×12): qty 1

## 2020-11-17 MED ORDER — ONDANSETRON HCL 4 MG/2ML IJ SOLN: 4.0000 mg | Freq: Four times a day (QID) | INTRAMUSCULAR | Status: DC | PRN

## 2020-11-17 MED ORDER — CEFAZOLIN SODIUM-DEXTROSE 2-3 GM-%(50ML) IV SOLR
INTRAVENOUS | Status: DC | PRN
  Administered 2020-11-17: 2 g via INTRAVENOUS

## 2020-11-17 MED ORDER — 0.9 % SODIUM CHLORIDE (POUR BTL) OPTIME
TOPICAL | Status: DC | PRN
  Administered 2020-11-17: 2000 mL

## 2020-11-17 SURGICAL SUPPLY — 121 items
APPLIER CLIP ROT 10 11.4 M/L (STAPLE)
BLADE CLIPPER SURG (BLADE) ×2 IMPLANT
BLADE SURG SZ11 CARB STEEL (BLADE) IMPLANT
BNDG COHESIVE 6X5 TAN STRL LF (GAUZE/BANDAGES/DRESSINGS) IMPLANT
CANISTER SUCT 3000ML PPV (MISCELLANEOUS) ×4 IMPLANT
CANNULA REDUC XI 12-8 STAPL (CANNULA) ×2
CANNULA REDUCER 12-8 DVNC XI (CANNULA) ×2 IMPLANT
CATH THORACIC 28FR (CATHETERS) IMPLANT
CATH THORACIC 28FR RT ANG (CATHETERS) IMPLANT
CATH THORACIC 36FR (CATHETERS) IMPLANT
CATH THORACIC 36FR RT ANG (CATHETERS) IMPLANT
CLIP APPLIE ROT 10 11.4 M/L (STAPLE) IMPLANT
CLIP VESOCCLUDE MED 6/CT (CLIP) IMPLANT
CNTNR URN SCR LID CUP LEK RST (MISCELLANEOUS) ×11 IMPLANT
CONN ST 1/4X3/8  BEN (MISCELLANEOUS) ×1
CONN ST 1/4X3/8 BEN (MISCELLANEOUS) ×1 IMPLANT
CONN Y 3/8X3/8X3/8  BEN (MISCELLANEOUS)
CONN Y 3/8X3/8X3/8 BEN (MISCELLANEOUS) IMPLANT
CONT SPEC 4OZ STRL OR WHT (MISCELLANEOUS) ×11
DEFOGGER SCOPE WARMER CLEARIFY (MISCELLANEOUS) ×2 IMPLANT
DERMABOND ADVANCED (GAUZE/BANDAGES/DRESSINGS) ×1
DERMABOND ADVANCED .7 DNX12 (GAUZE/BANDAGES/DRESSINGS) ×1 IMPLANT
DRAIN CHANNEL 28F RND 3/8 FF (WOUND CARE) ×2 IMPLANT
DRAIN CHANNEL 32F RND 10.7 FF (WOUND CARE) IMPLANT
DRAPE ARM DVNC X/XI (DISPOSABLE) ×4 IMPLANT
DRAPE COLUMN DVNC XI (DISPOSABLE) ×1 IMPLANT
DRAPE CV SPLIT W-CLR ANES SCRN (DRAPES) ×2 IMPLANT
DRAPE DA VINCI XI ARM (DISPOSABLE) ×4
DRAPE DA VINCI XI COLUMN (DISPOSABLE) ×1
DRAPE INCISE IOBAN 66X45 STRL (DRAPES) IMPLANT
DRAPE ORTHO SPLIT 77X108 STRL (DRAPES) ×1
DRAPE SURG ORHT 6 SPLT 77X108 (DRAPES) ×1 IMPLANT
ELECT BLADE 6.5 EXT (BLADE) ×2 IMPLANT
ELECT REM PT RETURN 9FT ADLT (ELECTROSURGICAL) ×2
ELECTRODE REM PT RTRN 9FT ADLT (ELECTROSURGICAL) ×1 IMPLANT
GAUZE KITTNER 4X5 RF (MISCELLANEOUS) ×4 IMPLANT
GAUZE SPONGE 4X4 12PLY STRL (GAUZE/BANDAGES/DRESSINGS) ×2 IMPLANT
GLOVE SURG SS PI 8.0 STRL IVOR (GLOVE) ×2 IMPLANT
GLOVE SURG SYN 7.5  E (GLOVE)
GLOVE SURG SYN 7.5 E (GLOVE) IMPLANT
GLOVE TRIUMPH SURG SIZE 7.5 (KITS) ×4 IMPLANT
GOWN STRL REUS W/ TWL LRG LVL3 (GOWN DISPOSABLE) ×2 IMPLANT
GOWN STRL REUS W/ TWL XL LVL3 (GOWN DISPOSABLE) ×3 IMPLANT
GOWN STRL REUS W/TWL 2XL LVL3 (GOWN DISPOSABLE) ×2 IMPLANT
GOWN STRL REUS W/TWL LRG LVL3 (GOWN DISPOSABLE) ×2
GOWN STRL REUS W/TWL XL LVL3 (GOWN DISPOSABLE) ×3
HEMOSTAT SURGICEL 2X14 (HEMOSTASIS) ×4 IMPLANT
IRRIGATION STRYKERFLOW (MISCELLANEOUS) ×1 IMPLANT
IRRIGATOR STRYKERFLOW (MISCELLANEOUS) ×2
KIT BASIN OR (CUSTOM PROCEDURE TRAY) ×4 IMPLANT
KIT SUCTION CATH 14FR (SUCTIONS) IMPLANT
KIT TURNOVER KIT B (KITS) ×2 IMPLANT
LOOP VESSEL SUPERMAXI WHITE (MISCELLANEOUS) IMPLANT
NEEDLE HYPO 25GX1X1/2 BEV (NEEDLE) ×2 IMPLANT
NEEDLE SPNL 22GX3.5 QUINCKE BK (NEEDLE) ×2 IMPLANT
NS IRRIG 1000ML POUR BTL (IV SOLUTION) ×4 IMPLANT
PACK CHEST (CUSTOM PROCEDURE TRAY) ×2 IMPLANT
PAD ARMBOARD 7.5X6 YLW CONV (MISCELLANEOUS) ×4 IMPLANT
PORT ACCESS TROCAR AIRSEAL 12 (TROCAR) ×1 IMPLANT
PORT ACCESS TROCAR AIRSEAL 5M (TROCAR) ×1
PROGEL SPRAY TIP 11IN (MISCELLANEOUS) ×4
RELOAD STAPLER 2.5X45 WHT DVNC (STAPLE) ×3 IMPLANT
RELOAD STAPLER 3.5X45 BLU DVNC (STAPLE) ×4 IMPLANT
RELOAD STAPLER 4.3X45 GRN DVNC (STAPLE) ×4 IMPLANT
RELOAD STAPLER 45 4.6 BLK DVNC (STAPLE) ×1 IMPLANT
SCISSORS LAP 5X35 DISP (ENDOMECHANICALS) IMPLANT
SEAL CANN UNIV 5-8 DVNC XI (MISCELLANEOUS) ×2 IMPLANT
SEAL XI 5MM-8MM UNIVERSAL (MISCELLANEOUS) ×2
SEALANT PROGEL (MISCELLANEOUS) IMPLANT
SEALANT SURG COSEAL 4ML (VASCULAR PRODUCTS) IMPLANT
SEALANT SURG COSEAL 8ML (VASCULAR PRODUCTS) IMPLANT
SET TRI-LUMEN FLTR TB AIRSEAL (TUBING) ×2 IMPLANT
SHEARS HARMONIC HDI 20CM (ELECTROSURGICAL) IMPLANT
SHEET MEDIUM DRAPE 40X70 STRL (DRAPES) IMPLANT
SOLUTION ELECTROLUBE (MISCELLANEOUS) ×2 IMPLANT
SPECIMEN JAR MEDIUM (MISCELLANEOUS) IMPLANT
SPONGE INTESTINAL PEANUT (DISPOSABLE) IMPLANT
SPONGE TONSIL TAPE 1 RFD (DISPOSABLE) IMPLANT
STAPLER 45 SUREFORM CVD (STAPLE) ×1
STAPLER 45 SUREFORM CVD DVNC (STAPLE) ×1 IMPLANT
STAPLER CANNULA SEAL DVNC XI (STAPLE) ×2 IMPLANT
STAPLER CANNULA SEAL XI (STAPLE) ×2
STAPLER RELOAD 2.5X45 WHITE (STAPLE) ×3
STAPLER RELOAD 2.5X45 WHT DVNC (STAPLE) ×3
STAPLER RELOAD 3.5X45 BLU DVNC (STAPLE) ×4
STAPLER RELOAD 3.5X45 BLUE (STAPLE) ×4
STAPLER RELOAD 4.3X45 GREEN (STAPLE) ×4
STAPLER RELOAD 4.3X45 GRN DVNC (STAPLE) ×4
STAPLER RELOAD 45 4.6 BLK (STAPLE) ×1
STAPLER RELOAD 45 4.6 BLK DVNC (STAPLE) ×1
SUT PDS AB 3-0 SH 27 (SUTURE) IMPLANT
SUT PROLENE 4 0 RB 1 (SUTURE)
SUT PROLENE 4-0 RB1 .5 CRCL 36 (SUTURE) IMPLANT
SUT SILK  1 MH (SUTURE) ×1
SUT SILK 1 MH (SUTURE) ×1 IMPLANT
SUT SILK 1 TIES 10X30 (SUTURE) IMPLANT
SUT SILK 2 0 SH (SUTURE) IMPLANT
SUT SILK 2 0SH CR/8 30 (SUTURE) IMPLANT
SUT SILK 3 0 SH 30 (SUTURE) IMPLANT
SUT SILK 3 0SH CR/8 30 (SUTURE) IMPLANT
SUT VIC AB 1 CTX 36 (SUTURE) ×1
SUT VIC AB 1 CTX36XBRD ANBCTR (SUTURE) ×1 IMPLANT
SUT VIC AB 2-0 CTX 36 (SUTURE) ×2 IMPLANT
SUT VIC AB 3-0 MH 27 (SUTURE) IMPLANT
SUT VIC AB 3-0 X1 27 (SUTURE) ×4 IMPLANT
SUT VICRYL 0 TIES 12 18 (SUTURE) ×2 IMPLANT
SUT VICRYL 0 UR6 27IN ABS (SUTURE) ×4 IMPLANT
SUT VICRYL 2 TP 1 (SUTURE) IMPLANT
SYR 20CC LL (SYRINGE) ×4 IMPLANT
SYR 20ML ECCENTRIC (SYRINGE) IMPLANT
SYR 30ML LL (SYRINGE) IMPLANT
SYSTEM RETRIEVAL ANCHOR 12 (MISCELLANEOUS) ×2 IMPLANT
SYSTEM SAHARA CHEST DRAIN ATS (WOUND CARE) ×2 IMPLANT
TAPE CLOTH 4X10 WHT NS (GAUZE/BANDAGES/DRESSINGS) ×2 IMPLANT
TAPE CLOTH SURG 4X10 WHT LF (GAUZE/BANDAGES/DRESSINGS) ×2 IMPLANT
TIP APPLICATOR SPRAY EXTEND 16 (VASCULAR PRODUCTS) IMPLANT
TIP SPRAY PROGEL 11IN (MISCELLANEOUS) ×2 IMPLANT
TOWEL GREEN STERILE (TOWEL DISPOSABLE) ×2 IMPLANT
TRAY FOLEY MTR SLVR 16FR STAT (SET/KITS/TRAYS/PACK) ×2 IMPLANT
TROCAR BLADELESS 15MM (ENDOMECHANICALS) IMPLANT
WATER STERILE IRR 1000ML POUR (IV SOLUTION) ×4 IMPLANT

## 2020-11-17 NOTE — Anesthesia Procedure Notes (Signed)
Central Venous Catheter Insertion Performed by: Nolon Nations, MD, anesthesiologist Start/End2/14/2022 7:10 AM, 11/17/2020 7:20 AM Patient location: Pre-op. Preanesthetic checklist: patient identified, IV checked, site marked, risks and benefits discussed, surgical consent, monitors and equipment checked, pre-op evaluation, timeout performed and anesthesia consent Position: Trendelenburg Lidocaine 1% used for infiltration and patient sedated Hand hygiene performed  and maximum sterile barriers used  Catheter size: 8 Fr Total catheter length 16. Central line was placed.Double lumen Procedure performed using ultrasound guided technique. Ultrasound Notes:anatomy identified, needle tip was noted to be adjacent to the nerve/plexus identified, no ultrasound evidence of intravascular and/or intraneural injection and image(s) printed for medical record Attempts: 1 Following insertion, dressing applied, line sutured and Biopatch. Post procedure assessment: blood return through all ports, no air and free fluid flow  Patient tolerated the procedure well with no immediate complications.

## 2020-11-17 NOTE — Brief Op Note (Addendum)
11/17/2020  11:11 AM  PATIENT:  Nathaniel Little  75 y.o. male  PRE-OPERATIVE DIAGNOSIS:  LEFT LOWER LOBE Nodule  POST-OPERATIVE DIAGNOSIS:  Non-small cell carcinoma left lower lobe  PROCEDURE:  Procedure(s):  XI ROBOTIC ASSISTED THORASCOPY LEFT LOWER LOBECTOMY (Left) LYMPH NODE DISSECTION (Left) INTERCOSTAL NERVE BLOCK (Left)   SURGEON:  Surgeon(s) and Role:    Melrose Nakayama, MD - Primary  PHYSICIAN ASSISTANT: Ellwood Handler PA-C  ANESTHESIA:   general  EBL:  150 mL   BLOOD ADMINISTERED:none  DRAINS: 28 [address]   LOCAL MEDICATIONS USED:  BUPIVICAINE   SPECIMEN:  Source of Specimen:  Left upper lobe, lymph nodes  DISPOSITION OF SPECIMEN:  PATHOLOGY  COUNTS:  YES  TOURNIQUET:  * No tourniquets in log *  DICTATION: .Dragon Dictation  PLAN OF CARE: Admit to inpatient   PATIENT DISPOSITION:  PACU - hemodynamically stable.   Delay start of Pharmacological VTE agent (>24hrs) due to surgical blood loss or risk of bleeding: no

## 2020-11-17 NOTE — Interval H&P Note (Signed)
History and Physical Interval Note: See note from Lorenso Quarry, [place] for cardiac clearance 11/17/2020 7:19 AM  Nathaniel Little  has presented today for surgery, with the diagnosis of LLL NODULE.  The various methods of treatment have been discussed with the patient and family. After consideration of risks, benefits and other options for treatment, the patient has consented to  Procedure(s): XI ROBOTIC ASSISTED THORACOSCOPY-SUPERIOR SEGMENTECTOMY (Left) possible XI ROBOTIC ASSISTED THORASCOPY-LEFT LOWER LOBECTOMY (Left) as a surgical intervention.  The patient's history has been reviewed, patient examined, no change in status, stable for surgery.  I have reviewed the patient's chart and labs.  Questions were answered to the patient's satisfaction.     Melrose Nakayama

## 2020-11-17 NOTE — Plan of Care (Signed)

## 2020-11-17 NOTE — Plan of Care (Signed)

## 2020-11-17 NOTE — Hospital Course (Addendum)
History of Present Illness:  Mr. Tessler is a 75 yo male with complicated medical history.  He has a long history of tobacco abuse, COPD, Aortic Stenosis S/P TAVR, IPMN status post robotic Whipple procedure, choledocholithiasis with hepatic stents in place, pancreatitis, obesity, OSA, Type 2 DM, Lymphedema, and LLL lung nodule.  The patient has a 200 pack year smoking of up to 4 packs per day for > 50 years.  He continues to smoke but is down to a few cigarettes per day.  During workup for his TAVR in June of 2021, he was noted to have a lung nodule.  Follow up CT scan in August showed no changed in the nodule.  However, repeat CT scan obtained in December showed the nodule had increased in size to 1.5 cm.  He was evaluated by Dr. Patsey Berthold who referred the patient to [facility] for surgical evaluation.  He was evaluated by Dr. Roxan Hockey at which time it was felt radiation may be the best treatment option, however the patient really wished to proceed with surgical resection.  Due to the location of the nodule he was educated that to get adequate margins he may require a left lower lobectomy.  His PFT's did show adequate function for a lobectomy.  It was recommended he follow up with Dr. Rockey Situ to ensure no further cardiac clearance would be indicated.  The risks and benefits of the procedure were explained to the patient and he was agreeable to proceed.   Hospital Course:  Mr. Mcglory presented to [facility] on 11/17/2020.  He was taken to th operating room and underwent Robotic Assisted Left Video Thoracoscopy with Left Lower Lobectomy, Lymph Node Dissection, and Intercostal Nerve Block.  He tolerated the procedure without difficulty, was extubated and taken to the PACU in stable condition.  On postoperative day #1 he was noted to have a air leak with some tidal variation in chest tube system but chest x-ray showed no evidence of pneumothorax.  The tube was kept in place.  He did have some increasing oxygen  needs and is a little slow to progress in regards to his pulmonary toilet.  He is getting DuoNeb's as well as Breo Ellipta inhaler.  He is noted to have an expected acute blood loss anemia with hematocrit 24 and has been started on iron supplementation.  Postoperative day #2 was felt due to increase airway collapse and secretions as well as findings on chest x-ray that he would benefit from bronchoscopy which was done.  He was also started on vancomycin and Maxipime for broad-spectrum pulmonary coverage.  On postop day #3 his chest x-ray showed significant improvement as did his overall clinical status.  Oxygen is being weaned over time and bleeding is decreasing.  Chest tube continues to have moderate serosanguineous drainage and is currently in place.  Anemia and creatinine have showed improvement over time as well.

## 2020-11-17 NOTE — Transfer of Care (Signed)
Immediate Anesthesia Transfer of Care Note  Patient: Nathaniel Little  Procedure(s) Performed: XI ROBOTIC ASSISTED THORASCOPY-LEFT LOWER LOBECTOMY (Left Chest) INTERCOSTAL NERVE BLOCK (Left Chest) NODE DISSECTION (Left Chest)  Patient Location: PACU  Anesthesia Type:General  Level of Consciousness: awake, alert  and oriented  Airway & Oxygen Therapy: Patient connected to face mask oxygen  Post-op Assessment: Post -op Vital signs reviewed and stable  Post vital signs: stable  Last Vitals:  Vitals Value Taken Time  BP    Temp    Pulse 71 11/17/20 1138  Resp 20 11/17/20 1138  SpO2 94 % 11/17/20 1138  Vitals shown include unvalidated device data.  Last Pain:  Vitals:   11/17/20 0609  TempSrc:   PainSc: 3       Patients Stated Pain Goal: 2 (00/52/59 1028)  Complications: No complications documented.

## 2020-11-17 NOTE — Anesthesia Procedure Notes (Signed)
Arterial Line Insertion Start/End2/14/2022 7:15 AM, 11/17/2020 7:25 AM Performed by: Lavell Luster, CRNA, CRNA  Preanesthetic checklist: patient identified, IV checked, risks and benefits discussed, surgical consent, pre-op evaluation and timeout performed Lidocaine 1% used for infiltration Right, radial was placed Catheter size: 20 G Hand hygiene performed , maximum sterile barriers used  and Seldinger technique used Allen's test indicative of satisfactory collateral circulation Attempts: 1 Procedure performed without using ultrasound guided technique. Following insertion, Biopatch and dressing applied. Post procedure assessment: normal  Patient tolerated the procedure well with no immediate complications.

## 2020-11-17 NOTE — Anesthesia Procedure Notes (Signed)
Procedure Name: Intubation Date/Time: 11/17/2020 7:45 AM Performed by: Lavell Luster, CRNA Pre-anesthesia Checklist: Patient identified, Emergency Drugs available, Suction available and Patient being monitored Patient Re-evaluated:Patient Re-evaluated prior to induction Oxygen Delivery Method: Circle system utilized Preoxygenation: Pre-oxygenation with 100% oxygen Induction Type: IV induction Ventilation: Mask ventilation without difficulty and Oral airway inserted - appropriate to patient size Laryngoscope Size: Mac and 4 Grade View: Grade I Tube type: Oral Endobronchial tube: Double lumen EBT and EBT position confirmed by fiberoptic bronchoscope and 37 Fr Number of attempts: 1 Airway Equipment and Method: Stylet and Oral airway Placement Confirmation: ETT inserted through vocal cords under direct vision,  positive ETCO2 and breath sounds checked- equal and bilateral Tube secured with: Tape Dental Injury: Teeth and Oropharynx as per pre-operative assessment

## 2020-11-18 ENCOUNTER — Inpatient Hospital Stay (HOSPITAL_COMMUNITY): Payer: Medicare Other

## 2020-11-18 ENCOUNTER — Encounter (HOSPITAL_COMMUNITY): Payer: Self-pay | Admitting: Thoracic Surgery (Cardiothoracic Vascular Surgery)

## 2020-11-18 LAB — CBC
HCT: 24.4 % — ABNORMAL LOW (ref 39.0–52.0)
Hemoglobin: 8.2 g/dL — ABNORMAL LOW (ref 13.0–17.0)
MCH: 30 pg (ref 26.0–34.0)
MCHC: 33.6 g/dL (ref 30.0–36.0)
MCV: 89.4 fL (ref 80.0–100.0)
Platelets: 112 10*3/uL — ABNORMAL LOW (ref 150–400)
RBC: 2.73 MIL/uL — ABNORMAL LOW (ref 4.22–5.81)
RDW: 18.5 % — ABNORMAL HIGH (ref 11.5–15.5)
WBC: 7.8 10*3/uL (ref 4.0–10.5)
nRBC: 0 % (ref 0.0–0.2)

## 2020-11-18 LAB — BASIC METABOLIC PANEL
Anion gap: 6 (ref 5–15)
BUN: 23 mg/dL (ref 8–23)
CO2: 26 mmol/L (ref 22–32)
Calcium: 8 mg/dL — ABNORMAL LOW (ref 8.9–10.3)
Chloride: 102 mmol/L (ref 98–111)
Creatinine, Ser: 1.32 mg/dL — ABNORMAL HIGH (ref 0.61–1.24)
GFR, Estimated: 56 mL/min — ABNORMAL LOW (ref >60)
Glucose, Bld: 148 mg/dL — ABNORMAL HIGH (ref 70–99)
Potassium: 4.2 mmol/L (ref 3.5–5.1)
Sodium: 134 mmol/L — ABNORMAL LOW (ref 135–145)

## 2020-11-18 LAB — GLUCOSE, CAPILLARY
Glucose-Capillary: 143 mg/dL — ABNORMAL HIGH (ref 70–99)
Glucose-Capillary: 166 mg/dL — ABNORMAL HIGH (ref 70–99)
Glucose-Capillary: 130 mg/dL — ABNORMAL HIGH (ref 70–99)
Glucose-Capillary: 198 mg/dL — ABNORMAL HIGH (ref 70–99)
Glucose-Capillary: 197 mg/dL — ABNORMAL HIGH (ref 70–99)

## 2020-11-18 MED ORDER — FUROSEMIDE 10 MG/ML IJ SOLN
40.0000 mg | Freq: Once | INTRAMUSCULAR | Status: AC
  Administered 2020-11-18: 40 mg via INTRAVENOUS
  Filled 2020-11-18: qty 4

## 2020-11-18 MED ORDER — ACETYLCYSTEINE 20 % IN SOLN
4.0000 mL | Freq: Three times a day (TID) | RESPIRATORY_TRACT | Status: DC
  Administered 2020-11-18 – 2020-11-22 (×8): 4 mL via RESPIRATORY_TRACT
  Filled 2020-11-18 (×17): qty 4

## 2020-11-18 MED ORDER — INSULIN ASPART 100 UNIT/ML ~~LOC~~ SOLN
0.0000 [IU] | Freq: Three times a day (TID) | SUBCUTANEOUS | Status: DC
  Administered 2020-11-18: 2 [IU] via SUBCUTANEOUS
  Administered 2020-11-18: 3 [IU] via SUBCUTANEOUS
  Administered 2020-11-20: 2 [IU] via SUBCUTANEOUS
  Administered 2020-11-21: 3 [IU] via SUBCUTANEOUS

## 2020-11-18 MED ORDER — METFORMIN HCL 500 MG PO TABS
1000.0000 mg | ORAL_TABLET | Freq: Two times a day (BID) | ORAL | Status: DC
  Administered 2020-11-18 – 2020-11-23 (×11): 1000 mg via ORAL
  Filled 2020-11-18 (×11): qty 2

## 2020-11-18 MED ORDER — FE FUMARATE-B12-VIT C-FA-IFC PO CAPS
1.0000 | ORAL_CAPSULE | Freq: Two times a day (BID) | ORAL | Status: DC
  Administered 2020-11-18 – 2020-11-23 (×11): 1 via ORAL
  Filled 2020-11-18 (×13): qty 1

## 2020-11-18 MED ORDER — IPRATROPIUM-ALBUTEROL 0.5-2.5 (3) MG/3ML IN SOLN
3.0000 mL | Freq: Four times a day (QID) | RESPIRATORY_TRACT | Status: DC | PRN
  Administered 2020-11-18 – 2020-11-22 (×7): 3 mL via RESPIRATORY_TRACT
  Filled 2020-11-18 (×8): qty 3

## 2020-11-18 MED ORDER — OXYCODONE HCL 5 MG PO TABS
5.0000 mg | ORAL_TABLET | ORAL | Status: DC | PRN
  Administered 2020-11-19 – 2020-11-23 (×2): 5 mg via ORAL
  Filled 2020-11-18 (×2): qty 1

## 2020-11-18 MED ORDER — GUAIFENESIN ER 600 MG PO TB12
1200.0000 mg | ORAL_TABLET | Freq: Two times a day (BID) | ORAL | Status: DC
  Administered 2020-11-18 – 2020-11-23 (×10): 1200 mg via ORAL
  Filled 2020-11-18 (×11): qty 2

## 2020-11-18 MED ORDER — INSULIN ASPART 100 UNIT/ML ~~LOC~~ SOLN: 0.0000 [IU] | Freq: Every day | SUBCUTANEOUS | Status: DC

## 2020-11-18 NOTE — Progress Notes (Addendum)
[address]       [place] [address]             [phone]      1 Day Post-Op Procedure(s) (LRB): XI ROBOTIC ASSISTED THORASCOPY-LEFT LOWER LOBECTOMY (Left) INTERCOSTAL NERVE BLOCK (Left) NODE DISSECTION (Left) Subjective: Feels fair, + productive cough , fair pain control  Objective: Vital signs in last 24 hours: Temp:  [97.7 F (36.5 C)-99 F (37.2 C)] 99 F (37.2 C) (02/15 0719) Pulse Rate:  [57-72] 68 (02/15 0719) Cardiac Rhythm: Heart block (02/15 0700) Resp:  [12-24] 18 (02/15 0719) BP: (99-160)/(47-76) 120/68 (02/15 0719) SpO2:  [90 %-99 %] 90 % (02/15 0719) Arterial Line BP: (121-184)/(53-68) 162/65 (02/14 1453)  Hemodynamic parameters for last 24 hours:    Intake/Output from previous day: 02/14 0701 - 02/15 0700 In: 1880 [P.O.:480; I.V.:1100; IV Piggyback:300] Out: 1450 [Urine:1180; Blood:150; Chest Tube:120] Intake/Output this shift: No intake/output data recorded.  General appearance: alert, cooperative and no distress Heart: regular rate and rhythm Lungs: + coarse with some wheezing Abdomen: benign Extremities: no edema, PAS in place Wound: incis healing well  Lab Results: Recent Labs    11/18/20 0400  WBC 7.8  HGB 8.2*  HCT 24.4*  PLT 112*   BMET:  Recent Labs    11/18/20 0400  NA 134*  K 4.2  CL 102  CO2 26  GLUCOSE 148*  BUN 23  CREATININE 1.32*  CALCIUM 8.0*    PT/INR: No results for input(s): LABPROT, INR in the last 72 hours. ABG    Component Value Date/Time   PHART 7.432 11/14/2020 1505   HCO3 25.8 11/14/2020 1505   TCO2 28 04/01/2020 1137   ACIDBASEDEF 1.0 04/01/2020 0946   O2SAT 95.4 11/14/2020 1505   CBG (last 3)  Recent Labs    11/17/20 2353 11/18/20 0358 11/18/20 0718  GLUCAP 214* 143* 166*    Meds Scheduled Meds: . acetaminophen  1,000 mg Oral Q6H   Or  . acetaminophen (TYLENOL) oral liquid 160 mg/5 mL  1,000 mg Oral Q6H  . aspirin EC  81 mg Oral Daily  . atorvastatin  80 mg  Oral Daily  . bisacodyl  10 mg Oral Daily  . carvedilol  6.25 mg Oral BID WC  . Chlorhexidine Gluconate Cloth  6 each Topical Daily  . cloNIDine  0.1 mg Oral BID  . enoxaparin (LOVENOX) injection  40 mg Subcutaneous Q24H  . ezetimibe  10 mg Oral Daily  . fluticasone furoate-vilanterol  1 puff Inhalation Daily  . gabapentin  200 mg Oral QHS  . insulin aspart  0-24 Units Subcutaneous Q4H  . insulin glargine  20 Units Subcutaneous QHS  . metoCLOPramide (REGLAN) injection  10 mg Intravenous Q6H  . pantoprazole  40 mg Oral BID  . senna-docusate  1 tablet Oral QHS  . traZODone  100 mg Oral QHS   Continuous Infusions: . sodium chloride 100 mL/hr at 11/17/20 1814  .  ceFAZolin (ANCEF) IV 2 g (11/17/20 2337)   PRN Meds:.albuterol, albuterol, fentaNYL (SUBLIMAZE) injection, ondansetron (ZOFRAN) IV, traMADol  Xrays DG Chest Port 1 View  Result Date: 11/17/2020 CLINICAL DATA:  Post LEFT lower lobectomy. EXAM: PORTABLE CHEST 1 VIEW COMPARISON:  November 14, 2020 FINDINGS: RIGHT IJ central venous access device terminates at the caval to atrial junction. LEFT-sided chest tube terminates near the LEFT upper chest at the level of the aortic arch, just lateral to the aortic arch. Leads project over the chest.  Signs of trans arterial aortic valve replacement as before. Chain sutures along the LEFT mediastinal border related to LEFT lower lobectomy. Cardiomediastinal contours are otherwise unchanged. No visible pneumothorax with LEFT-sided chest tube in place. RIGHT lung is clear. Retrocardiac opacity on the LEFT. On limited assessment no acute skeletal process. IMPRESSION: 1. Expected postoperative appearance of the chest post LEFT lower lobectomy. No visible pneumothorax. 2. LEFT lower lobe density, retrocardiac may represent atelectasis, attention on follow-up. 3. RIGHT IJ central venous access device in place. Electronically Signed   By: Zetta Bills M.D.   On: 11/17/2020 12:19    Assessment/Plan: S/P  Procedure(s) (LRB): XI ROBOTIC ASSISTED THORASCOPY-LEFT LOWER LOBECTOMY (Left) INTERCOSTAL NERVE BLOCK (Left) NODE DISSECTION (Left)  1 Tmax 99, VSS, sBP 99-160 2 sats 90 on 2 liters, cont inhalers IS/flutter valve 3 expected ABLA , HgB 8.2 , monitor closely, start trinsicon 4 thrombocytopenia- monitor  5 leukopenia- monitor 6 creat 1.32- below previous reading, try to avoid nephrotoxic meds, good UOP. May be able to decrease IVF if tol po soon 7 cont insulin/SSI sugars reasonably well controlled- add back glucophage  8 CXR - no pntx, L small effusion, bibas atx 9 CT 120 cc, + large air leak- keep in place 10  Routine rehab 11 DVT ppx- lovenox    LOS: 1 day    John Giovanni PA-C Pager [phone] 11/18/2020 Patient seen and examined Has large tidal variation and an air leak, lung well expanded on CXR Change CBG/ SSI to [facility] and HS IV to [facility] Ambulate, SCD + enoxaparin for DVT prophylaxis IS + flutter  Remo Lipps C. Roxan Hockey, MD [facility] [phone]

## 2020-11-18 NOTE — Discharge Summary (Addendum)
What was ordered overload physician Discharge Summary  Patient ID: Nathaniel Little MRN: [id number] DOB/AGE: May 04, 1946 75 y.o.  PCP is [name], [name], FNP Referring Provider is Verl Bangs, FNP    Admit date: 11/17/2020 Discharge date: 11/23/2020  Admission Diagnoses: Left lower lobe lung mass  Discharge Diagnoses: Adenocarcinoma left lower lobe of the lung, clinical stage Ia (T1, N0), pathologic stage Ia (T1b, N0) Active Problems:   S/P robot-assisted Video Thoracoscopy with Left Lower Lobectomy, Lymph Node Dissection, and Intercostal Nerve Block   Patient Active Problem List   Diagnosis Date Noted  . S/P robot-assisted Video Thoracoscopy with Left Lower Lobectomy, Lymph Node Dissection, and Intercostal Nerve Block 11/17/2020  . Hip pain 09/11/2020  . Uncontrolled type 2 diabetes mellitus with hyperglycemia ([place]) 06/13/2020  . DM type 2 with diabetic peripheral neuropathy ([place]) 06/13/2020  . Post-operative pain 06/13/2020  . Tremor 06/13/2020  . Physical deconditioning 06/13/2020  . Dark stools 04/17/2020  . BRBPR (bright red blood per rectum) 04/17/2020  . S/P TAVR (transcatheter aortic valve replacement) 04/01/2020  . (HFpEF) heart failure with preserved ejection fraction ([place])   . Diabetes mellitus without complication ([place])   . Incidental pulmonary nodule, greater than or equal to 8mm 03/11/2020  . Duodenitis 02/18/2020  . Diabetes ([place]) 12/06/2019  . Erectile dysfunction due to arterial insufficiency 09/24/2019  . Lumbar degenerative disc disease 06/25/2019  . Sacroiliac joint dysfunction of both sides 06/25/2019  . Chronic heart failure with preserved ejection fraction ([place]) 01/19/2019  . Tobacco abuse 01/19/2019  . Severe aortic stenosis   . Hyperlipidemia 07/20/2018  . IPMN (intraductal papillary mucinous neoplasm) 12/24/2017  . Claudication in peripheral vascular disease ([place]) 08/22/2017  . Pancreatic mass 08/20/2017  . Aortic atherosclerosis ([place]) 07/25/2017   . BMI 38.0-38.9,adult 02/09/2017  . Arthritis pain of hip 01/07/2017  . Chronic obstructive pulmonary disease ([place]) 01/07/2017  . GERD (gastroesophageal reflux disease) 01/07/2017  . Essential hypertension 01/07/2017  . Depression 01/06/2017  . Sleep apnea with use of continuous positive airway pressure (CPAP) 01/06/2017  . Personal history of prostate cancer 03/04/2013    Discharged Condition: good  History of Present Illness:  Nathaniel Little is a 75 yo male with complicated medical history.  He has a long history of tobacco abuse, COPD, Aortic Stenosis S/P TAVR, IPMN status post robotic Whipple procedure, choledocholithiasis with hepatic stents in place, pancreatitis, obesity, OSA, Type 2 DM, Lymphedema, and LLL lung nodule.  The patient has a 200 pack year smoking of up to 4 packs per day for > 50 years.  He continues to smoke but is down to a few cigarettes per day.  During workup for his TAVR in June of 2021, he was noted to have a lung nodule.  Follow up CT scan in August showed no changed in the nodule.  However, repeat CT scan obtained in December showed the nodule had increased in size to 1.5 cm.  He was evaluated by Dr. Patsey Berthold who referred the patient to [facility] for surgical evaluation.  He was evaluated by Dr. Roxan Hockey at which time it was felt radiation may be the best treatment option, however the patient really wished to proceed with surgical resection.  Due to the location of the nodule he was educated that to get adequate margins he may require a left lower lobectomy.  His PFT's did show adequate function for a lobectomy.  It was recommended he follow up with Dr. Rockey Situ to ensure no further cardiac clearance would be indicated.  The risks and benefits of the procedure were explained to the patient and he was agreeable to proceed.   Hospital Course:  [name] presented to [facility] on 11[facility]17/[facility].  He was taken to th operating room and underwent Robotic Assisted Left Video  Thoracoscopy with Left Lower Lobectomy, Lymph Node Dissection, and Intercostal Nerve Block.  He tolerated the procedure without difficulty, was extubated and taken to the PACU in stable condition.  On postoperative day #1 he was noted to have a air leak with some tidal variation in chest tube system but chest x-ray showed no evidence of pneumothorax.  The tube was kept in place.  He did have some increasing oxygen needs and is a little slow to progress in regards to his pulmonary toilet.  He is getting DuoNeb's as well as Breo Ellipta inhaler.  He is noted to have an expected acute blood loss anemia with hematocrit 24 and has been started on iron supplementation.  Postoperative day #2 was felt due to increase airway collapse and secretions as well as findings on chest x-ray that he would benefit from bronchoscopy which was done.  He was also started on vancomycin and Maxipime for broad-spectrum pulmonary coverage.  On postop day #3 his chest x-ray showed significant improvement as did his overall clinical status.  Oxygen is being weaned over time and need is decreasing.  We have made arrangements for home oxygen at 2 L per nasal cannula .  The chest tube was removed on 11/21/2020.  Anemia and creatinine have showed improvement over time as well.  Antibiotic has been transitioned to p.o. Augmentin which will continue for another 10 days at discharge.  Incisions are noted to be healing well without evidence of infection.  His activity level is improving slowly over time.  He does have some lower extremity edema and we will give him a short course of p.o. Lasix/potassium for 5 days.  This is mostly chronic with venous stasis changes.  Also instructed him to keep legs elevated.  At the time of discharge she is felt to be stable.  SURGICAL PATHOLOGY  CASE: [id number]  PATIENT: Nathaniel Little  Surgical Pathology Report      Clinical History: Left lower lobe nodule (cm)      FINAL MICROSCOPIC DIAGNOSIS:    A. LUNG, LEFT LOWER LOBE, LOBECTOMY:  - Invasive mucinous adenocarcinoma, 1.4 cm.  - No visceral pleura invasion identified.  - Margins of resection are not involved.  - Five hilar lymph nodes, negative for carcinoma (0/5).  - See oncology table.   B. LYMPH NODE, STATION 9, BIOPSY:  - Fibrous and adipose tissue, negative for carcinoma.  - No distinct nodal tissue identified.   C. LYMPH NODE, STATION 9 #2, BIOPSY:  - Fibrous and adipose tissue, negative for carcinoma.  - No distinct nodal tissue identified.   D. LYMPH NODE, STATION 8, BIOPSY:  - One lymph node, negative for carcinoma (0/1).   E. LYMPH NODE, STATION 7 #1, BIOPSY:  - One lymph node, negative for carcinoma (0/1).   F. LYMPH NODE, STATION 7 #2, BIOPSY:  - One lymph node, negative for carcinoma (0/1).   G. LYMPH NODE, STATION 7 #3, BIOPSY:  - One lymph node, negative for carcinoma (0/1).   H. LYMPH NODE, STATION 10, BIOPSY:  - One lymph node, negative for carcinoma (0/1).   I. LYMPH NODE, STATION 7 #4, BIOPSY:  - One lymph node, negative for carcinoma (0/1).   J. LYMPH NODE, STATION  4L, BIOPSY:  - One lymph node, negative for carcinoma (0/1).   K. LYMPH NODE, STATION 5, BIOPSY:  - One lymph node, negative for carcinoma (0/1).   L. LYMPH NODE, STATION 5 #2, BIOPSY:  - One lymph node, negative for carcinoma (0/1).   M. LYMPH NODE, LEVEL 12, BIOPSY:  - One lymph node, negative for carcinoma (0/1).   N. LYMPH NODE, LEVEL 11, BIOPSY:  - One lymph node, negative for carcinoma (0/1).   O. LYMPH NODE, LEVEL 12 #2, BIOPSY:  - One lymph node, negative for carcinoma (0/1).   P. LYMPH NODE, LEVEL 11 #2, BIOPSY:  - One lymph node, negative for carcinoma (0/1).   ONCOLOGY TABLE:   LUNG: Resection   Synchronous Tumors: Not applicable  Total Number of Primary Tumors: 1  Procedure: Lobectomy  Specimen Laterality: Left  Tumor Focality: Single focus  Tumor Site: Lower lobe of lung  Tumor Size: Greatest  dimension: 1.4 cm  Histologic Type: Invasive mucinous adenocarcinoma  Visceral Pleura Invasion: Not identified  Direct Invasion of Adjacent Structures: No adjacent structures present  Lymphovascular Invasion: Not identified  Margins: All margins negative for invasive carcinoma    Closest Margin(s) to Invasive Carcinoma: 3 cm, bronchial margin    Margin(s) Involved by Invasive Carcinoma: Not applicable     Margin Status for Non-Invasive Tumor: Not applicable  Treatment Effect: No known presurgical therapy  Regional Lymph Nodes:    Number of Lymph Nodes Involved: 0       Nodal Sites with Tumor: Not applicable    Number of Lymph Nodes Examined: 18       Nodal Sites Examined: 4, 5, 7, 8, 10, 11, 12 and Hilar  Distant Metastasis:    Distant Site(s) Involved: Not applicable  Pathologic Stage Classification (pTNM, AJCC 8th Edition): pT1b, pN0  Ancillary Studies: Can be performed upon request  Representative Tumor Block: A2  Comment: Tumor cells are positive for CK7, TTF-1 and Napsin-A. CK20  demonstrates patchy positive staining, and CDX-2 focal positive  staining. Dr. Tresa Moore reviewed select slides.  (v4.2.0.1)    INTRAOPERATIVE DIAGNOSIS:   A. Left lower lobe lung: "Mass: Non-small cell neoplasm. Bronchial  margin: Negative for neoplasm."  Intraoperative diagnosis rendered by Dr. Jeannie Done at 11:15 AM on  11/17/2020.   GROSS DESCRIPTION:   A: Specimen: Left lower lobectomy, received fresh for rapid  intraoperative consult.  Specimen integrity: Intact.  Size, weight: 16 x 8.5 x 4 cm, 209 g.  Pleura: The pleura is smooth, tan-red to anthracotic. There is a suture  attached identifying the mass.  Lesion: At the superior-lateral lung there is a 1.4 x 1.2 x 1 cm  well-circumscribed tan-red mass.  Margin(s): The mass is 3 cm from the bronchial margin. The mass is  subjacent to the pleura which is grossly intact.  Hilar vessels: Grossly free of tumor.   Nonneoplastic parenchyma: Spongy, red-brown.  Lymph nodes: At the hilum are 5 anthracotic lymph node measuring 0.5 to  0.7 cm.  Block Summary:  Sections of the mass and bronchial margin are submitted for frozen  section. Sections are submitted in 8 blocks.  1 = bronchial margin submitted for frozen section.  2 = mass submitted for frozen section.  3 = vascular margin.  4-5 = mass.  6 = uninvolved parenchyma.  7 = hilar lymph nodes.   B: Received fresh is a 0.7 cm piece of soft tan-yellow tissue. The  specimen is submitted in toto.   C: Received fresh is  a 0.6 cm piece of soft tan-yellow tissue. The  specimen is submitted in toto.   D: Received fresh is a 0.7 cm anthracotic soft tissue. The specimen is  submitted in toto.   E: Received fresh is a 0.9 cm anthracotic soft tissue. The specimen is  submitted in toto.   F: Received fresh is a 0.6 cm anthracotic soft tissue. The specimen is  submitted in toto.   G: Received fresh is a 1 cm anthracotic soft tissue. The specimen is  submitted in toto.   H: Received fresh is a 1 cm anthracotic soft tissue. The specimen is  submitted in toto.   I: Received fresh is a 2 cm anthracotic soft tissue. The specimen is  submitted in toto.   J: Received fresh is a 1.3 cm anthracotic soft tissue. The specimen is  submitted in toto.   K: Received fresh is a 0.8 cm anthracotic soft tissue. The specimen is  submitted in toto.   L: Received fresh is a 2 cm anthracotic soft tissue. The specimen is  bisected and entirely submitted in 2 blocks.   M: Received fresh is a 1 cm anthracotic soft tissue. The specimen is  submitted in toto.   N: Received fresh is a 0.8 cm anthracotic soft tissue. The specimen is  submitted in toto.   O: Received fresh is a 0.8 cm anthracotic soft tissue. The specimen is  submitted in toto.   P: Received fresh is a 0.6 cm anthracotic soft tissue. The specimen is  submitted in toto. [facility]  11/17/2020)    Final Diagnosis performed by Gillie Manners, MD.  Electronically  signed 11/19/2020  Technical and / or Professional components performed at [facility]. [facility], [place] [address], [place], [place] [address].  Immunohistochemistry Technical component (if applicable) was performed  at [facility]. [address], [place],  [place], [place] [address].  IMMUNOHISTOCHEMISTRY DISCLAIMER (if applicable):  Some of these immunohistochemical stains may have been developed and the  performance characteristics determine by [facility]. Some  may not have been cleared or approved by the U.S. Food and Drug  Administration. The FDA has determined that such clearance or approval  is not necessary. This test is used for clinical purposes. It should not  be regarded as investigational or for research. This laboratory is  certified under the [place]  (CLIA-88) as qualified to perform high complexity clinical laboratory  testing. The controls stained appropriately.  Consults: None  Significant Diagnostic Studies: Routine postop labs and x-rays.  Treatments: surgery: and bronchoscopy  OPERATIVE REPORT  DATE OF PROCEDURE:  11/17/2020  PREOPERATIVE DIAGNOSIS:  Left lower lobe lung nodule.  POSTOPERATIVE DIAGNOSIS:  Left lower lobe lung nodule.  PROCEDURE:  Robotic left VATS, left lower lobectomy, lymph node dissection, intercostal nerve blocks at levels 3 through 10.  SURGEON:  Modesto Charon, MD  ASSISTANT:  Ellwood Handler, PAC.    ANESTHESIA:  General.  FINDINGS:  Incomplete fissure anatomy not favorable for segmentectomy.  Frozen section revealed nonsmall cell carcinoma, bronchial margin clear.       OPERATIVE REPORT    DATE OF PROCEDURE:  11/19/2020  PREOPERATIVE DIAGNOSIS:  Left upper lobe atelectasis secondary to mucus plugging.  POSTOPERATIVE DIAGNOSIS:  Left  upper lobe atelectasis secondary to mucus plugging.  PROCEDURE:  Flexible fiberoptic bronchoscopy.  ANESTHESIA:  Local with intravenous sedation.  FINDINGS:  Extensive thick mucus throughout left mainstem, the left mainstem friable.  No endobronchial lesion. Discharge Exam: Blood pressure (!) 101/50, pulse 61, temperature (!) 97.5 F (36.4 C), temperature source Oral, resp. rate 17, height 5\' 9"  (1.753 m), weight 95.3 kg, SpO2 96 %.   General appearance: alert, cooperative and no distress Heart: regular rate and rhythm Lungs: slightly coarse and dim in left Abdomen: benign Extremities: edema + LE edema, venous stasis dermatitis noted and fairly stable Wound: incis healing well   Disposition: Discharge disposition: 01-Home or Self Care       Discharge Instructions    Discharge patient   Complete by: As directed    With home O2 in place   Discharge disposition: 01-Home or Self Care   Discharge patient date: 11/23/2020     Allergies as of 11/23/2020      Reactions   Codeine Itching      Medication List    TAKE these medications   acetaminophen 325 MG tablet Commonly known as: Tylenol Take 2 tablets (650 mg total) by mouth every 6 (six) hours as needed.   albuterol (2.5 MG/3ML) 0.083% nebulizer solution Commonly known as: PROVENTIL Take 2.5 mg by nebulization every 6 (six) hours as needed for wheezing or shortness of breath.   albuterol 108 (90 Base) MCG/ACT inhaler Commonly known as: VENTOLIN HFA Inhale 2 puffs into the lungs every 6 (six) hours as needed for wheezing or shortness of breath.   amoxicillin-clavulanate 875-125 MG tablet Commonly known as: AUGMENTIN Take 1 tablet by mouth every 12 (twelve) hours.   aspirin EC 81 MG tablet Take 81 mg by mouth daily.   atorvastatin 80 MG tablet Commonly known as: LIPITOR Take 80 mg by mouth daily.   Breztri Aerosphere 160-9-4.8 MCG/ACT Aero Generic drug: Budeson-Glycopyrrol-Formoterol Inhale 2 puffs into  the lungs 2 (two) times daily.   carvedilol 6.25 MG tablet Commonly known as: COREG Take 1 tablet (6.25 mg total) by mouth 2 (two) times daily with a meal.   cloNIDine 0.2 MG tablet Commonly known as: CATAPRES Take 1 tablet (0.2 mg total) by mouth 2 (two) times daily. What changed:   medication strength  how much to take   clopidogrel 75 MG tablet Commonly known as: PLAVIX TAKE 1 TABLET BY MOUTH ONCE DAILY   diclofenac Sodium 1 % Gel Commonly known as: VOLTAREN Apply 1 application topically 3 (three) times daily as needed (pain).   ezetimibe 10 MG tablet Commonly known as: ZETIA Take 1 tablet (10 mg total) by mouth daily.   ferrous [id number] C-folic acid capsule Commonly known as: TRINSICON / FOLTRIN Take 1 capsule by mouth 2 (two) times daily after a meal.   fluticasone 50 MCG/ACT nasal spray Commonly known as: Flonase Place 1 spray into both nostrils daily as needed for allergies.   furosemide 40 MG tablet Commonly known as: Lasix Take 1 tablet (40 mg total) by mouth daily.   gabapentin 100 MG capsule Commonly known as: NEURONTIN Take 1-2 capsules (100-200 mg total) by mouth at bedtime. What changed: how much to take   insulin glargine 100 unit/mL Sopn Commonly known as: LANTUS Inject 20 Units into the skin at bedtime.   ipratropium-albuterol 0.5-2.5 (3) MG/3ML Soln Commonly known as: DUONEB Take 3 mLs by nebulization every 6 (six) hours as needed (Asthma).   metFORMIN 500 MG tablet Commonly known as: GLUCOPHAGE Take 1,000 mg by mouth 2 (two) times daily.   pantoprazole 40 MG tablet Commonly known as: PROTONIX Take 1 tablet (40 mg total) by mouth 2 (two) times daily.  potassium chloride 10 MEQ tablet Commonly known as: KLOR-CON Take 2 tablets (20 mEq total) by mouth daily.   traZODone 100 MG tablet Commonly known as: DESYREL Take 100 mg by mouth at bedtime.            Durable Medical Equipment  (From admission, onward)          Start     Ordered   11/18/20 1046  For home use only DME oxygen  Once       Question Answer Comment  Length of Need 6 Months   Mode or (Route) Nasal cannula   Liters per Minute 3   Frequency Continuous (stationary and portable oxygen unit needed)   Oxygen delivery system Gas      11/18/20 1045          Follow-up Information    Melrose Nakayama, MD Follow up.   Specialty: Cardiothoracic Surgery Why: Please see discharge paperwork for follow-up appointment with nurse for suture removal and appointment to see surgeon.  On the day you see surgeon obtain a chest x-ray from [facility] 1/2-hour prior to the appointment.  It is in the same office c Contact information: [place] [address] [facility] Follow up.   Why: oxygen Contact information: [place] [address] [phone]               Signed:  John Giovanni PA-C 11/23/2020, 10:40 AM

## 2020-11-18 NOTE — Plan of Care (Signed)
  Problem: Education: Goal: Knowledge of General Education information will improve Description: Including pain rating scale, medication(s)/side effects and non-pharmacologic comfort measures Outcome: Progressing   Problem: Clinical Measurements: Goal: Ability to maintain clinical measurements within normal limits will improve Outcome: Progressing   Problem: Clinical Measurements: Goal: Respiratory complications will improve Outcome: Progressing   Problem: Activity: Goal: Risk for activity intolerance will decrease Outcome: Progressing   Problem: Nutrition: Goal: Adequate nutrition will be maintained Outcome: Progressing   Problem: Pain Managment: Goal: General experience of comfort will improve Outcome: Progressing   Problem: Skin Integrity: Goal: Risk for impaired skin integrity will decrease Outcome: Progressing

## 2020-11-18 NOTE — Discharge Instructions (Signed)
[facility] office [phone]   Robot-Assisted Thoracic Surgery, Care After The following information offers guidance on how to care for yourself after your procedure. Your health care provider may also give you more specific instructions. If you have problems or questions, contact your health care provider. What can I expect after the procedure? After the procedure, it is common to have:  Some pain and aches in the area of your surgical incisions.  Pain when breathing in (inhaling) and coughing.  Tiredness (fatigue).  Trouble sleeping.  Constipation. Follow these instructions at home: Medicines  Take over-the-counter and prescription medicines only as told by your health care provider.  If you were prescribed an antibiotic medicine, take it as told by your health care provider. Do not stop taking the antibiotic even if you start to feel better.  Talk with your health care provider about safe and effective ways to manage pain after your procedure. Pain management should fit your specific health needs.  Take pain medicine before pain becomes severe. Relieving and controlling your pain will make breathing easier for you.  Ask your health care provider if the medicine prescribed to you requires you to avoid driving or using machinery. Eating and drinking Follow instructions from your health care provider about eating or drinking restrictions. These will vary depending on what procedure you had. Your health care provider may recommend:  A liquid diet or soft diet for the first few days.  Meals that are smaller and more frequent.  A diet of fruits, vegetables, whole grains, and low-fat proteins.  Limiting foods that are high in fat and processed sugar, including fried or sweet foods. Incision care  Follow instructions from your health care provider about how to take care of your incisions. Make sure you: ? Wash your hands with soap and water for at least 20 seconds before and after  you change your bandage (dressing). If soap and water are not available, use hand sanitizer. ? Change your dressing as told by your health care provider. ? Leave stitches (sutures), skin glue, or adhesive strips in place. These skin closures may need to stay in place for 2 weeks or longer. If adhesive strip edges start to loosen and curl up, you may trim the loose edges. Do not remove adhesive strips completely unless your health care provider tells you to do that.  Check your incision area every day for signs of infection. Check for: ? Redness, swelling, or more pain. ? Fluid or blood. ? Warmth. ? Pus or a bad smell. Activity  Return to your normal activities as told by your health care provider. Ask your health care provider what activities are safe for you.  Ask your health care provider when it is safe for you to drive.  Do not lift anything that is heavier than 10 lb (4.5 kg), or the limit that you are told, until your health care provider says that it is safe.  Rest as told by your health care provider.  Avoid sitting for a long time without moving. Get up to take short walks every 1-2 hours. This is important to improve blood flow and breathing. Ask for help if you feel weak or unsteady.  Do exercises as told by your health care provider.  You may shower and clean the incisions gently with soap and water, pat dry gently. Pneumonia prevention  Do deep breathing exercises and cough regularly as directed. This helps clear mucus and opens your lungs. Doing this helps prevent lung infection (pneumonia).  If you were given an incentive spirometer, use it as told. An incentive spirometer is a tool that measures how well you are filling your lungs with each breath.  Coughing may hurt less if you try to support your chest. This is called splinting. Try one of these when you cough: ? Hold a pillow against your chest. ? Place the palms of both hands on top of your incision area.  Do not  use any products that contain nicotine or tobacco. These products include cigarettes, chewing tobacco, and vaping devices, such as e-cigarettes. If you need help quitting, ask your health care provider.  Avoid secondhand smoke.   General instructions  If you have a drainage tube: ? Follow instructions from your health care provider about how to take care of it. ? Do not travel by airplane after your tube is removed until your health care provider tells you it is safe.  You may need to take these actions to prevent or treat constipation: ? Drink enough fluid to keep your urine pale yellow. ? Take over-the-counter or prescription medicines. ? Eat foods that are high in fiber, such as beans, whole grains, and fresh fruits and vegetables. ? Limit foods that are high in fat and processed sugars, such as fried or sweet foods.  Keep all follow-up visits. This is important. Contact a health care provider if:  You have redness, swelling, or more pain around an incision.  You have fluid or blood coming from an incision.  An incision feels warm to the touch.  You have pus or a bad smell coming from an incision.  You have a fever.  You cannot eat or drink without vomiting.  Your pain medicine is not controlling your pain. Get help right away if:  You have chest pain.  Your heart is beating quickly.  You have trouble breathing.  You have trouble speaking.  You are confused.  You feel weak or dizzy, or you faint. These symptoms may represent a serious problem that is an emergency. Do not wait to see if the symptoms will go away. Get medical help right away. Call your local emergency services (911 in the [place] Do not drive yourself to the hospital. Summary  Talk with your health care provider about safe and effective ways to manage pain after your procedure. Pain management should fit your specific health needs.  Return to your normal activities as told by your health care  provider. Ask your health care provider what activities are safe for you.  Do deep breathing exercises and cough regularly as directed. This helps to clear mucus and prevent pneumonia. If it hurts to cough, ease pain by holding a pillow against your chest or by placing the palms of both hands over your incisions. This information is not intended to replace advice given to you by your health care provider. Make sure you discuss any questions you have with your health care provider. Document Revised: 06/13/2020 Document Reviewed: 06/13/2020 Elsevier Patient Education  2021 [employer].

## 2020-11-18 NOTE — Progress Notes (Addendum)
      [address]       [place] [address]             [phone]      Contacted via nurse the patient was complaining of chest pain.  While up moving from bedside commode to chair he became diaphoretic with decreased oxygen saturations into the 70s.  They obtained a 12 lead EKG.  Upon arrival  BP (!) 120/59 (BP Location: Left Arm)   Pulse 71   Temp 97.9 F (36.6 C) (Oral)   Resp 18   Ht 5\' 9"  (1.753 m)   Wt 95.3 kg   SpO2 92%   BMI 31.01 kg/m   Gen: no apparent distress Heart: RRR Lungs: diminished, course   EKG reviewed: no acute changes from EKG on 2/4  Patient with air leak in pleurovac... CXR obtained-- no pneumothorax, however he has collapsed his left side most likely due to mucous plugging  A/P:  As discussed with Dr. Roxan Hockey  Mucomyst nebs Flutter Valve Mucinex  IV lasix 40 mg once  Ellwood Handler, PA-C  Patient seen and examined, agree with above He actually has a good cough but complains that it hurts. I emphasized the importance of clearing secretions to prevent pneumonia. Will repeat CXR in AM- if still plugged will need bronch  Lawayne Hartig C. Roxan Hockey, MD [facility] [phone]

## 2020-11-18 NOTE — Anesthesia Postprocedure Evaluation (Signed)
Anesthesia Post Note  Patient: Nathaniel Little  Procedure(s) Performed: XI ROBOTIC ASSISTED THORASCOPY-LEFT LOWER LOBECTOMY (Left Chest) INTERCOSTAL NERVE BLOCK (Left Chest) NODE DISSECTION (Left Chest)     Patient location during evaluation: PACU Anesthesia Type: General Level of consciousness: sedated and patient cooperative Pain management: pain level controlled Vital Signs Assessment: post-procedure vital signs reviewed and stable Respiratory status: spontaneous breathing Cardiovascular status: stable Anesthetic complications: no   No complications documented.  Last Vitals:  Vitals:   11/18/20 0845 11/18/20 0847  BP:    Pulse:  64  Resp:  16  Temp:    SpO2: (!) 89% 90%    Last Pain:  Vitals:   11/18/20 0844  TempSrc:   PainSc: [name]

## 2020-11-18 NOTE — Op Note (Signed)
NAMECUYLER, Nathaniel Little MEDICAL RECORD [id number] ACCOUNT [id number] DATE OF BIRTH:1945/10/09 FACILITY: [facility] LOCATION: [facility] PHYSICIAN:Leyton Magoon Chaya Jan, MD  OPERATIVE REPORT  DATE OF PROCEDURE:  11/17/2020  PREOPERATIVE DIAGNOSIS:  Left lower lobe lung nodule.  POSTOPERATIVE DIAGNOSIS: Non-small cell lung carcinoma, clinical stage Ia (T1, N0).  PROCEDURE:   Robotic left VATS, Left lower lobectomy, Lymph node dissection, Intercostal nerve blocks levels 3 through 10.  SURGEON:  Modesto Charon, MD  ASSISTANT:  Ellwood Handler, PA-C.    ANESTHESIA:  General.  FINDINGS:  Incomplete fissure.  Anatomy not favorable for segmentectomy.  Frozen section revealed nonsmall cell carcinoma, bronchial margin clear.  CLINICAL NOTE:  Nathaniel Little is a 75 year old man with a history of tobacco abuse who had a lung nodule found on CT in 2021.  On PET/CT, there was mild uptake with an SUV of 2.  On followup there was no significant change.  On a subsequent repeat CT, the  nodule had increased in size.  He was referred for surgical resection.  The indications, risks, benefits, and alternatives were discussed in detail with the patient.  He understood and accepted the risks and agreed to proceed.  The plan was to potentially  do a segmentectomy, although the nodule was in the lower part of the superior segment and there was concern that it would not provide an adequate margin, so there was a possibility we would need to do a lobectomy.  OPERATIVE NOTE:  Nathaniel Little was brought to the preoperative holding area on 11/17/2020.  Anesthesia placed a central venous catheter and arterial blood pressure monitoring line.  He was taken to the Operating Room, anesthetized and intubated.   Intravenous antibiotics were administered.  A Foley catheter was placed.  Sequential compression devices were placed on the calves for DVT prophylaxis.  He was placed in a right lateral decubitus position and the left chest was  prepped and draped in the  usual sterile fashion.  Single lung ventilation of the right lung was initiated and was tolerated well throughout the procedure.  A timeout was performed.  A solution containing 20 mL of liposomal bupivacaine, 30 mL of 0.5% bupivacaine and 50 mL of saline was prepared.  This was used for local anesthesia at the incision sites as well as for the intercostal nerve blocks.  An incision was made in the ninth interspace in the mid axillary line, an 8 mm port was inserted.  The thoracoscope was advanced into the chest.  There was good isolation of the left lung.  Carbon dioxide was insufflated per protocol.  A 12 mm port was  placed in the 9th interspace 5 cm anterior to the camera port.  Intercostal nerve blocks then were performed from the 3rd to the 10th interspace.  A needle was placed from a posterior approach and 10 mL of the bupivacaine solution was injected into a  subpleural plane at each level.  An additional 12 mm port was placed 5 cm posterior to the camera port and then an 8 mm port was placed 5 cm posterior to that.  The assistant port was a 12 mm AirSeal port placed in the 10th interspace.  The lung was  retracted superiorly and the inferior ligament was divided.  All lymph nodes that were encountered were removed and sent as separate specimens for permanent pathology.  The pleural reflection was divided at the hilum posteriorly and level 8 and 7 nodes  were removed.  Dissecting up onto the pulmonary artery and bronchus a  level 10 node was removed as well.  Dissection then continued superiorly and the AP window was opened.  Level 5 nodes were removed.  The pleural reflection was divided at the hilum  anteriorly.  Inspection of the fissure revealed that it was incomplete.  Initial attempts to dissect the fissure revealed that this was going to be difficult, if not impossible.  Dissecting anteriorly the major fissure was initiated with sequential  firings of the robotic  stapler using a blue cartridge.  A level 11 node was dissected out.  The anatomy was not favorable for completing the fissure.  The decision was made to proceed with a lobectomy and worked from an inferior approach.  The inferior  pulmonary vein was encircled and divided with the robotic stapler.  Next, the lower lobe bronchus was dissected out.  This was carefully encircled and then the robotic stapler with a green cartridge was placed across the left lower lobe bronchus at its  origin.  A test inflation showed good aeration of the upper lobe.  The stapler was fired.  Initially, the superior segmental branch was not completely divided and a second firing was performed to divide that.  The superior segmental artery had been  dissected out and divided earlier in the procedure.  While working from the fissure posteriorly the basilar segmental artery now was dissected out, encircled, and divided with the vascular stapler.  The fissure then was completed with sequential firings  of the robotic stapler using green and black cartridges.  The vessel loop and sponges were removed.  A 12 mm endoscopic retrieval bag was placed through the AirSeal port and the lower lobe was placed into the bag.  A small portion of the inferior aspect  of the lobe would not fit into the bag.  The bag then was brought down to the lower part of the chest.  The robot was undocked.  The anterior 9th interspace incision was enlarged and the specimen was removed.  The nodule was marked and specimen was sent  for frozen section of the nodule and bronchial margin.  The nodule turned out to be a nonsmall cell carcinoma.  Bronchial margin was free of tumor.  The chest was copiously irrigated with warm saline.  There was a small visceral pleural tear on the upper lobe.  Progel was applied to that area.  A 28-French Blake drain was placed via the original port incision and secured with a #1 silk suture.  Final  inspection was made for hemostasis.   Dual-lung ventilation was resumed.  The remaining incisions were closed in standard fashion.  The chest tube was placed to a Pleur-evac on waterseal.  The patient then was extubated in the operating room and taken to  the [facility] in good condition.  All sponge, needle, and instrument counts were correct for the procedure.  HN/NUANCE  D:11/17/2020 T:11/18/2020 JOB:[id number]

## 2020-11-19 ENCOUNTER — Encounter (HOSPITAL_COMMUNITY)
Admission: RE | Disposition: A | Discharge status: Home / Self Care | Payer: Self-pay | Source: Home / Self Care | Attending: Thoracic Surgery (Cardiothoracic Vascular Surgery)

## 2020-11-19 ENCOUNTER — Inpatient Hospital Stay (HOSPITAL_COMMUNITY): Payer: Medicare Other

## 2020-11-19 ENCOUNTER — Encounter (HOSPITAL_COMMUNITY): Payer: Self-pay | Admitting: Thoracic Surgery (Cardiothoracic Vascular Surgery)

## 2020-11-19 ENCOUNTER — Inpatient Hospital Stay (HOSPITAL_COMMUNITY): Payer: Medicare Other | Admitting: Certified Registered Nurse Anesthetist

## 2020-11-19 DIAGNOSIS — I9789 Other postprocedural complications and disorders of the circulatory system, not elsewhere classified: Secondary | ICD-10-CM

## 2020-11-19 HISTORY — PX: FLEXIBLE BRONCHOSCOPY: SHX5094

## 2020-11-19 LAB — COMPREHENSIVE METABOLIC PANEL
ALT: 41 U/L (ref 0–44)
AST: 40 U/L (ref 15–41)
Albumin: 2.7 g/dL — ABNORMAL LOW (ref 3.5–5.0)
Alkaline Phosphatase: 99 U/L (ref 38–126)
Anion gap: 10 (ref 5–15)
BUN: 23 mg/dL (ref 8–23)
CO2: 21 mmol/L — ABNORMAL LOW (ref 22–32)
Calcium: 8.2 mg/dL — ABNORMAL LOW (ref 8.9–10.3)
Chloride: 102 mmol/L (ref 98–111)
Creatinine, Ser: 1.36 mg/dL — ABNORMAL HIGH (ref 0.61–1.24)
GFR, Estimated: 54 mL/min — ABNORMAL LOW (ref >60)
Glucose, Bld: 169 mg/dL — ABNORMAL HIGH (ref 70–99)
Potassium: 4 mmol/L (ref 3.5–5.1)
Sodium: 133 mmol/L — ABNORMAL LOW (ref 135–145)
Total Bilirubin: 0.9 mg/dL (ref 0.3–1.2)
Total Protein: 5.3 g/dL — ABNORMAL LOW (ref 6.5–8.1)

## 2020-11-19 LAB — CBC
HCT: 27.3 % — ABNORMAL LOW (ref 39.0–52.0)
Hemoglobin: 9 g/dL — ABNORMAL LOW (ref 13.0–17.0)
MCH: 29.5 pg (ref 26.0–34.0)
MCHC: 33 g/dL (ref 30.0–36.0)
MCV: 89.5 fL (ref 80.0–100.0)
Platelets: 113 10*3/uL — ABNORMAL LOW (ref 150–400)
RBC: 3.05 MIL/uL — ABNORMAL LOW (ref 4.22–5.81)
RDW: 18.5 % — ABNORMAL HIGH (ref 11.5–15.5)
WBC: 8.9 10*3/uL (ref 4.0–10.5)
nRBC: 0 % (ref 0.0–0.2)

## 2020-11-19 LAB — GLUCOSE, CAPILLARY
Glucose-Capillary: 115 mg/dL — ABNORMAL HIGH (ref 70–99)
Glucose-Capillary: 99 mg/dL (ref 70–99)
Glucose-Capillary: 102 mg/dL — ABNORMAL HIGH (ref 70–99)
Glucose-Capillary: 87 mg/dL (ref 70–99)
Glucose-Capillary: 115 mg/dL — ABNORMAL HIGH (ref 70–99)
Glucose-Capillary: 137 mg/dL — ABNORMAL HIGH (ref 70–99)

## 2020-11-19 LAB — SURGICAL PATHOLOGY

## 2020-11-19 SURGERY — BRONCHOSCOPY, FLEXIBLE
Anesthesia: Monitor Anesthesia Care | Site: Chest

## 2020-11-19 MED ORDER — LIDOCAINE HCL (PF) 1 % IJ SOLN
INTRAMUSCULAR | Status: DC | PRN
  Administered 2020-11-19: 30 mL

## 2020-11-19 MED ORDER — VANCOMYCIN HCL 2000 MG/400ML IV SOLN
2000.0000 mg | Freq: Once | INTRAVENOUS | Status: AC
  Administered 2020-11-19: 2000 mg via INTRAVENOUS
  Filled 2020-11-19: qty 400

## 2020-11-19 MED ORDER — KETAMINE HCL 10 MG/ML IJ SOLN
INTRAMUSCULAR | Status: DC | PRN
  Administered 2020-11-19 (×3): 10 mg via INTRAVENOUS

## 2020-11-19 MED ORDER — CHLORHEXIDINE GLUCONATE 0.12 % MT SOLN
15.0000 mL | OROMUCOSAL | Status: AC
  Administered 2020-11-19: 15 mL via OROMUCOSAL
  Filled 2020-11-19 (×2): qty 15

## 2020-11-19 MED ORDER — MIDAZOLAM HCL 2 MG/2ML IJ SOLN
INTRAMUSCULAR | Status: AC
  Filled 2020-11-19: qty 2

## 2020-11-19 MED ORDER — KETAMINE HCL 50 MG/5ML IJ SOSY
PREFILLED_SYRINGE | INTRAMUSCULAR | Status: AC
  Filled 2020-11-19: qty 5

## 2020-11-19 MED ORDER — GLYCOPYRROLATE PF 0.2 MG/ML IJ SOSY
PREFILLED_SYRINGE | INTRAMUSCULAR | Status: DC | PRN
  Administered 2020-11-19: .1 mg via INTRAVENOUS

## 2020-11-19 MED ORDER — LIDOCAINE HCL URETHRAL/MUCOSAL 2 % EX GEL
CUTANEOUS | Status: DC | PRN
  Administered 2020-11-19: 1 via INTRATRACHEAL

## 2020-11-19 MED ORDER — ACETYLCYSTEINE 20 % IN SOLN
4.0000 mL | RESPIRATORY_TRACT | Status: DC
  Filled 2020-11-19: qty 4

## 2020-11-19 MED ORDER — SODIUM CHLORIDE 0.9 % IV SOLN
2.0000 g | Freq: Two times a day (BID) | INTRAVENOUS | Status: DC
  Administered 2020-11-19 (×2): 2 g via INTRAVENOUS
  Filled 2020-11-19 (×2): qty 2

## 2020-11-19 MED ORDER — MIDAZOLAM HCL 2 MG/2ML IJ SOLN
INTRAMUSCULAR | Status: DC | PRN
  Administered 2020-11-19: 1 mg via INTRAVENOUS

## 2020-11-19 MED ORDER — LIDOCAINE HCL URETHRAL/MUCOSAL 2 % EX GEL
CUTANEOUS | Status: AC
  Filled 2020-11-19: qty 11

## 2020-11-19 MED ORDER — BUTAMBEN-TETRACAINE-BENZOCAINE 2-2-14 % EX AERO
1.0000 | INHALATION_SPRAY | CUTANEOUS | Status: DC
  Filled 2020-11-19: qty 20

## 2020-11-19 MED ORDER — VANCOMYCIN HCL 1000 MG/200ML IV SOLN
1000.0000 mg | INTRAVENOUS | Status: DC
  Administered 2020-11-20: 1000 mg via INTRAVENOUS
  Filled 2020-11-19: qty 200

## 2020-11-19 MED ORDER — LIDOCAINE HCL (PF) 1 % IJ SOLN
INTRAMUSCULAR | Status: AC
  Filled 2020-11-19: qty 30

## 2020-11-19 MED ORDER — FENTANYL CITRATE (PF) 100 MCG/2ML IJ SOLN: 25.0000 ug | INTRAMUSCULAR | Status: DC | PRN

## 2020-11-19 MED ORDER — SODIUM CHLORIDE 0.9 % IV SOLN: INTRAVENOUS | Status: DC

## 2020-11-19 MED ORDER — PROPOFOL 10 MG/ML IV BOLUS
INTRAVENOUS | Status: AC
  Filled 2020-11-19: qty 20

## 2020-11-19 MED ORDER — LACTATED RINGERS IV SOLN: INTRAVENOUS | Status: DC

## 2020-11-19 MED ORDER — BUTAMBEN-TETRACAINE-BENZOCAINE 2-2-14 % EX AERO
INHALATION_SPRAY | CUTANEOUS | Status: DC | PRN
  Administered 2020-11-19: 3 via TOPICAL

## 2020-11-19 MED ORDER — EPINEPHRINE PF 1 MG/ML IJ SOLN
INTRAMUSCULAR | Status: AC
  Filled 2020-11-19: qty 1

## 2020-11-19 MED ORDER — 0.9 % SODIUM CHLORIDE (POUR BTL) OPTIME
TOPICAL | Status: DC | PRN
  Administered 2020-11-19: 1000 mL

## 2020-11-19 MED ORDER — FENTANYL CITRATE (PF) 250 MCG/5ML IJ SOLN
INTRAMUSCULAR | Status: AC
  Filled 2020-11-19: qty 5

## 2020-11-19 MED ORDER — EPINEPHRINE PF 1 MG/ML IJ SOLN
INTRAMUSCULAR | Status: DC | PRN
  Administered 2020-11-19: 1 mg via ENDOTRACHEOPULMONARY

## 2020-11-19 SURGICAL SUPPLY — 37 items
ADAPTER VALVE BIOPSY EBUS (MISCELLANEOUS) IMPLANT
ADPTR VALVE BIOPSY EBUS (MISCELLANEOUS)
APPLICATOR COTTON TIP 6 STRL (MISCELLANEOUS) IMPLANT
APPLICATOR COTTON TIP 6IN STRL (MISCELLANEOUS) ×6 IMPLANT
BLADE CLIPPER SURG (BLADE) ×2 IMPLANT
BRUSH CYTOL CELLEBRITY 1.5X140 (MISCELLANEOUS) IMPLANT
CANISTER SUCT 3000ML PPV (MISCELLANEOUS) ×2 IMPLANT
CNTNR URN SCR LID CUP LEK RST (MISCELLANEOUS) ×2 IMPLANT
CONT SPEC 4OZ STRL OR WHT (MISCELLANEOUS) ×2
COVER BACK TABLE 60X90IN (DRAPES) ×2 IMPLANT
FILTER STRAW FLUID ASPIR (MISCELLANEOUS) IMPLANT
FORCEPS BIOP RJ4 1.8 (CUTTING FORCEPS) IMPLANT
FORCEPS RADIAL JAW LRG 4 PULM (INSTRUMENTS) IMPLANT
GAUZE SPONGE 4X4 12PLY STRL (GAUZE/BANDAGES/DRESSINGS) ×2 IMPLANT
GLOVE SURG SIGNA 7.5 PF LTX (GLOVE) ×2 IMPLANT
GOWN STRL REUS W/ TWL LRG LVL3 (GOWN DISPOSABLE) ×1 IMPLANT
GOWN STRL REUS W/ TWL XL LVL3 (GOWN DISPOSABLE) ×1 IMPLANT
GOWN STRL REUS W/TWL LRG LVL3 (GOWN DISPOSABLE) ×1
GOWN STRL REUS W/TWL XL LVL3 (GOWN DISPOSABLE) ×1
KIT CLEAN ENDO COMPLIANCE (KITS) ×2 IMPLANT
KIT TURNOVER KIT B (KITS) ×2 IMPLANT
MARKER SKIN DUAL TIP RULER LAB (MISCELLANEOUS) ×2 IMPLANT
NS IRRIG 1000ML POUR BTL (IV SOLUTION) ×2 IMPLANT
OIL SILICONE PENTAX (PARTS (SERVICE/REPAIRS)) ×2 IMPLANT
PAD ARMBOARD 7.5X6 YLW CONV (MISCELLANEOUS) ×4 IMPLANT
RADIAL JAW LRG 4 PULMONARY (INSTRUMENTS)
SYR 20ML ECCENTRIC (SYRINGE) ×4 IMPLANT
SYR 5ML LL (SYRINGE) ×2 IMPLANT
SYR 5ML LUER SLIP (SYRINGE) ×2 IMPLANT
SYR CONTROL 10ML LL (SYRINGE) ×1 IMPLANT
TOWEL GREEN STERILE (TOWEL DISPOSABLE) ×2 IMPLANT
TOWEL GREEN STERILE FF (TOWEL DISPOSABLE) ×2 IMPLANT
TRAP SPECIMEN MUCUS 40CC (MISCELLANEOUS) ×2 IMPLANT
TUBE CONNECTING 20X1/4 (TUBING) ×2 IMPLANT
VALVE BIOPSY  SINGLE USE (MISCELLANEOUS) ×1
VALVE BIOPSY SINGLE USE (MISCELLANEOUS) ×1 IMPLANT
VALVE SUCTION BRONCHIO DISP (MISCELLANEOUS) ×2 IMPLANT

## 2020-11-19 NOTE — Interval H&P Note (Signed)
History and Physical Interval Note:  11/19/2020 9:28 AM  [name]  has presented today for surgery, with the diagnosis of Mucous Plug.  The various methods of treatment have been discussed with the patient and family. After consideration of risks, benefits and other options for treatment, the patient has consented to  Procedure(s): FLEXIBLE BRONCHOSCOPY (N/A) as a surgical intervention.  The patient's history has been reviewed, patient examined, no change in status, stable for surgery.  I have reviewed the patient's chart and labs.  Questions were answered to the patient's satisfaction.     Nathaniel Little

## 2020-11-19 NOTE — Consult Note (Signed)
   [facility] Inpatient Consult   11/19/2020  Nathaniel Little 1946/06/27 [id number]   [facility] [ACO] Patient: [name]  Patient is active in the [facility] Management program for diabetes management with [facility] team. This patient is also in an Embedded practice which has a chronic disease management Embedded Care Management team.  Plan: Notification sent to the [facility] Management and continue to follow for updates and needs for transition from the hospital.  Please contact for further questions,  Natividad Brood, RN BSN [facility] Liaison  [phone] business mobile phone Toll free office [phone]  Fax number: [phone] [email or web address]

## 2020-11-19 NOTE — Anesthesia Procedure Notes (Signed)
Procedure Name: [place] Performed by: Dorthea Cove, CRNA Pre-anesthesia Checklist: Patient identified, Suction available, Emergency Drugs available, Patient being monitored and Timeout performed Patient Re-evaluated:Patient Re-evaluated prior to induction Oxygen Delivery Method: Simple face mask Preoxygenation: Pre-oxygenation with 100% oxygen Induction Type: IV induction Placement Confirmation: positive ETCO2 and CO2 detector Dental Injury: Teeth and Oropharynx as per pre-operative assessment

## 2020-11-19 NOTE — H&P (View-Only) (Signed)
[address]       [place] [address]             [phone]      2 Days Post-Op Procedure(s) (LRB): XI ROBOTIC ASSISTED THORASCOPY-LEFT LOWER LOBECTOMY (Left) INTERCOSTAL NERVE BLOCK (Left) NODE DISSECTION (Left) Subjective: Clearing secretions better today  Objective: Vital signs in last 24 hours: Temp:  [97.7 F (36.5 C)-99 F (37.2 C)] 97.9 F (36.6 C) (02/16 0334) Pulse Rate:  [59-72] 66 (02/16 0700) Cardiac Rhythm: Normal sinus rhythm (02/16 0334) Resp:  [16-20] 19 (02/16 0700) BP: (100-155)/(56-75) 123/61 (02/16 0334) SpO2:  [81 %-99 %] 94 % (02/16 0700)  Hemodynamic parameters for last 24 hours:    Intake/Output from previous day: 02/15 0701 - 02/16 0700 In: 1440 [P.O.:1440] Out: 1870 [Urine:1250; Chest Tube:620] Intake/Output this shift: No intake/output data recorded.  General appearance: alert, cooperative and no distress Heart: regular rate and rhythm Lungs: fair air movement on left, right is clear Abdomen: benign Extremities: no edema or calf tenderness Wound: incis healing well  Lab Results: Recent Labs    11/18/20 0400 11/19/20 0040  WBC 7.8 8.9  HGB 8.2* 9.0*  HCT 24.4* 27.3*  PLT 112* 113*   BMET:  Recent Labs    11/18/20 0400 11/19/20 0040  NA 134* 133*  K 4.2 4.0  CL 102 102  CO2 26 21*  GLUCOSE 148* 169*  BUN 23 23  CREATININE 1.32* 1.36*  CALCIUM 8.0* 8.2*    PT/INR: No results for input(s): LABPROT, INR in the last 72 hours. ABG    Component Value Date/Time   PHART 7.432 11/14/2020 1505   HCO3 25.8 11/14/2020 1505   TCO2 28 04/01/2020 1137   ACIDBASEDEF 1.0 04/01/2020 0946   O2SAT 95.4 11/14/2020 1505   CBG (last 3)  Recent Labs    11/18/20 1600 11/18/20 2153 11/19/20 0602  GLUCAP 198* 197* 115*    Meds Scheduled Meds: . acetaminophen  1,000 mg Oral Q6H   Or  . acetaminophen (TYLENOL) oral liquid 160 mg/5 mL  1,000 mg Oral Q6H  . acetylcysteine  4 mL Nebulization TID  . aspirin  EC  81 mg Oral Daily  . atorvastatin  80 mg Oral Daily  . bisacodyl  10 mg Oral Daily  . carvedilol  6.25 mg Oral BID WC  . Chlorhexidine Gluconate Cloth  6 each Topical Daily  . cloNIDine  0.1 mg Oral BID  . enoxaparin (LOVENOX) injection  40 mg Subcutaneous Q24H  . ezetimibe  10 mg Oral Daily  . ferrous [id number] C-folic acid  1 capsule Oral BID PC  . fluticasone furoate-vilanterol  1 puff Inhalation Daily  . gabapentin  200 mg Oral QHS  . guaiFENesin  1,200 mg Oral BID  . insulin aspart  0-15 Units Subcutaneous TID WC  . insulin aspart  0-5 Units Subcutaneous QHS  . insulin glargine  20 Units Subcutaneous QHS  . metFORMIN  1,000 mg Oral BID WC  . pantoprazole  40 mg Oral BID  . senna-docusate  1 tablet Oral QHS  . traZODone  100 mg Oral QHS   Continuous Infusions: . sodium chloride 10 mL/hr at 11/18/20 0817   PRN Meds:.fentaNYL (SUBLIMAZE) injection, ipratropium-albuterol, ondansetron (ZOFRAN) IV, oxyCODONE, traMADol  Xrays DG CHEST PORT 1 VIEW  Result Date: 11/19/2020 CLINICAL DATA:  Chest tube, pneumothorax EXAM: PORTABLE CHEST 1 VIEW COMPARISON:  11/18/2020 FINDINGS: Left chest tube remains in place, unchanged. Near complete opacification of the  left hemithorax. Lucency over the left apex may reflect residual pneumothorax. Right lung clear. Prior TAVR. Right central line is unchanged. IMPRESSION: Left chest tube remains in place with near complete opacification of the left hemithorax and lucency over the left apex which may reflect residual pneumothorax. Electronically Signed   By: Rolm Baptise M.D.   On: 11/19/2020 07:03   DG CHEST PORT 1 VIEW  Result Date: 11/18/2020 CLINICAL DATA:  Pneumothorax EXAM: PORTABLE CHEST 1 VIEW COMPARISON:  Radiograph 11/18/2020 at 5:57 FINDINGS: Interval collapse of the LEFT upper lobe. Superior LEFT pneumothorax again noted. LEFT chest tube in place. RIGHT lung clear. PICC line unchanged IMPRESSION: 1. Moderate volume LEFT pneumothorax  with chest tube in place. 2. Interval collapse of the LEFT upper lobe. Electronically Signed   By: Suzy Bouchard M.D.   On: 11/18/2020 16:19   DG CHEST PORT 1 VIEW  Result Date: 11/18/2020 CLINICAL DATA:  Chest tube.  Prior lobectomy. EXAM: PORTABLE CHEST 1 VIEW COMPARISON:  11/17/2020. FINDINGS: Right central line and left chest tube in stable position. Postsurgical changes left lung again noted without interim change. Persistent bibasilar atelectasis. No prominent pleural effusion. Stable left-sided pleural thickening. No significant pneumothorax visualized. Prior cardiac valve replacement. Stable cardiomegaly. No pulmonary venous congestion. Left chest wall subcutaneous emphysema again noted. IMPRESSION: 1. Right central line and left chest tube in stable position. No significant pneumothorax visualized. Left chest wall subcutaneous emphysema again noted. 2. Postsurgical changes left lung again noted. Persistent bibasilar atelectasis. Electronically Signed   By: Marcello Moores  Register   On: 11/18/2020 08:00   DG Chest Port 1 View  Result Date: 11/17/2020 CLINICAL DATA:  Post LEFT lower lobectomy. EXAM: PORTABLE CHEST 1 VIEW COMPARISON:  November 14, 2020 FINDINGS: RIGHT IJ central venous access device terminates at the caval to atrial junction. LEFT-sided chest tube terminates near the LEFT upper chest at the level of the aortic arch, just lateral to the aortic arch. Leads project over the chest. Signs of trans arterial aortic valve replacement as before. Chain sutures along the LEFT mediastinal border related to LEFT lower lobectomy. Cardiomediastinal contours are otherwise unchanged. No visible pneumothorax with LEFT-sided chest tube in place. RIGHT lung is clear. Retrocardiac opacity on the LEFT. On limited assessment no acute skeletal process. IMPRESSION: 1. Expected postoperative appearance of the chest post LEFT lower lobectomy. No visible pneumothorax. 2. LEFT lower lobe density, retrocardiac may  represent atelectasis, attention on follow-up. 3. RIGHT IJ central venous access device in place. Electronically Signed   By: Zetta Bills M.D.   On: 11/17/2020 12:19    Assessment/Plan: S/P Procedure(s) (LRB): XI ROBOTIC ASSISTED THORASCOPY-LEFT LOWER LOBECTOMY (Left) INTERCOSTAL NERVE BLOCK (Left) NODE DISSECTION (Left)   1 afeb, VSS with some HTN readings- on home HTN meds, follow but may need adjustments 2 sats ok on 7 l HFNC 3 CXR with opacification on left, uncertain if pntx- may need to consider bronchoscopy 4 H/H improved 5 creat  Stable- good UOP 6 CT 620 cc 24/h- no air leak currently with cough, keep for now for drainage(serosang), H2O seal 7 cont aggressive pulm toilet/nebs, etc, ambulate 8 DVT PPX - lovenox 9 path pending     LOS: 2 days    John Giovanni PA-C Pager [phone] 11/19/2020 Patient seen and examined, CXR reviewed. CXR slightly improved but there is still extensive left upper lobe atelectasis. Will plan bronchoscopy to clear mucous plugging. I informed Mr. Highfill of the indications, risks, benefits and alternatives. He accepts the  risks and agrees to proceed.  Revonda Standard Roxan Hockey, MD [facility] [phone]

## 2020-11-19 NOTE — Progress Notes (Signed)
Pharmacy Antibiotic Note  Nathaniel Little is a 75 y.o. male admitted on 11/17/2020 with pneumonia.  Pharmacy has been consulted for vancomycin dosing.  Patient currently afebrile, wbc 8.9, scr slightly elevated at 1.36. Cefepime added earlier, now adding vancomycin. BAL done earlier today.   Vancomycin 1g IV Q 24 hrs. Goal AUC 400-550. Expected AUC: 485 SCr used: 1.36  Plan: Vancomycin 2g IV now then 1g IV every 24 hours.  Goal trough 15-20 mcg/mL. Cefepime 2g q12 hours  Height: _0  (175.3 cm) Weight: 95.3 kg (210 lb) IBW/kg (Calculated) : 70.7  Temp (24hrs), Avg:97.8 F (36.6 C), Min:97.6 F (36.4 C), Max:98 F (36.7 C)  Recent Labs  Lab 11/18/20 0400 11/19/20 0040  WBC 7.8 8.9  CREATININE 1.32* 1.36*    Estimated Creatinine Clearance: 53.4 mL/min (A) (by C-G formula based on SCr of 1.36 mg/dL (H)).    Allergies  Allergen Reactions  . Codeine Itching   Thank you for allowing pharmacy to be a part of this patient's care.  Erin Hearing PharmD., BCPS Clinical Pharmacist 11/19/2020 3:43 PM

## 2020-11-19 NOTE — Anesthesia Preprocedure Evaluation (Addendum)
Anesthesia Evaluation  Patient identified by MRN, date of birth, ID band Patient awake    Reviewed: Allergy & Precautions, NPO status , Patient's Chart, lab work & pertinent test results  History of Anesthesia Complications Negative for: history of anesthetic complications  Airway Mallampati: II  TM Distance: >3 FB Neck ROM: Full    Dental  (+) Edentulous Upper, Edentulous Lower   Pulmonary sleep apnea , COPD,  COPD inhaler, Current Smoker and Patient abstained from smoking.,   S/p VATS with LLL lobectomy 11/17/20 Mucus plugging     + rhonchi  + decreased breath sounds      Cardiovascular hypertension, Pt. on home beta blockers and Pt. on medications + CAD  Normal cardiovascular exam+ Valvular Problems/Murmurs (s/p TAVR)    '22 TTE - EF 60 to 65%. There is mild left ventricular hypertrophy. Grade I diastolic dysfunction (impaired relaxation).  Trivial mitral valve regurgitation. Post TAVR 04/01/20 with 29 mm Sapien 3 valve. Gradients have increased since day one post op 04/02/20 mean 14 mm -> 16 mmHg , Peak 28 mm _> 31 mmHg. There is a mild PVL that is more apparent than day one post op echo. Aortic valve regurgitation is mild.     Neuro/Psych PSYCHIATRIC DISORDERS Depression negative neurological ROS     GI/Hepatic Neg liver ROS, GERD  Medicated and Controlled,  Endo/Other  diabetes, Type 2, Oral Hypoglycemic Agents, Insulin Dependent Obesity   Renal/GU negative Renal ROS     Musculoskeletal  (+) Arthritis ,   Abdominal   Peds  Hematology  (+) anemia ,  On plavix Thrombocytopenia, PLT 113k    Anesthesia Other Findings Covid test negative   Reproductive/Obstetrics                            Anesthesia Physical Anesthesia Plan  ASA: III  Anesthesia Plan: MAC   Post-op Pain Management:    Induction: Intravenous  PONV Risk Score and Plan: 0 and Propofol infusion and Treatment  may vary due to age or medical condition  Airway Management Planned: Nasal Cannula and Natural Airway  Additional Equipment: None  Intra-op Plan:   Post-operative Plan:   Informed Consent: I have reviewed the patients History and Physical, chart, labs and discussed the procedure including the risks, benefits and alternatives for the proposed anesthesia with the patient or authorized representative who has indicated his/her understanding and acceptance.       Plan Discussed with: CRNA, Anesthesiologist and Surgeon  Anesthesia Plan Comments:        Anesthesia Quick Evaluation

## 2020-11-19 NOTE — Plan of Care (Signed)
  Problem: Education: Goal: Knowledge of General Education information will improve Description: Including pain rating scale, medication(s)/side effects and non-pharmacologic comfort measures Outcome: Progressing   Problem: Health Behavior/Discharge Planning: Goal: Ability to manage health-related needs will improve Outcome: Progressing   Problem: Clinical Measurements: Goal: Ability to maintain clinical measurements within normal limits will improve Outcome: Progressing   Problem: Clinical Measurements: Goal: Diagnostic test results will improve Outcome: Progressing   Problem: Clinical Measurements: Goal: Respiratory complications will improve Outcome: Progressing   Problem: Activity: Goal: Risk for activity intolerance will decrease Outcome: Progressing   Problem: Nutrition: Goal: Adequate nutrition will be maintained Outcome: Progressing   Problem: Pain Managment: Goal: General experience of comfort will improve Outcome: Progressing   Problem: Skin Integrity: Goal: Risk for impaired skin integrity will decrease Outcome: Progressing   

## 2020-11-19 NOTE — Brief Op Note (Signed)
11/19/2020  10:37 AM  PATIENT:  [name]  75 y.o. male  PRE-OPERATIVE DIAGNOSIS: LEFT UPPER LOBE ATELECTASIS secondary to  Mucous Plug  POST-OPERATIVE DIAGNOSIS:  LEFT UPPER LOBE ATELECTASIS secondary to  Mucous Plug  PROCEDURE:  Procedure(s): FLEXIBLE BRONCHOSCOPY (N/A)  SURGEON:  Surgeon(s) and Role:    * Melrose Nakayama, MD - Primary  PHYSICIAN ASSISTANT:   ASSISTANTS: none   ANESTHESIA:   IV sedation + cetacaine spray and 3 ml 1% lidocaine  EBL:  minimal  BLOOD ADMINISTERED:none  DRAINS: none   LOCAL MEDICATIONS USED:  LIDOCAINE   SPECIMEN:  Source of Specimen:  BAL  DISPOSITION OF SPECIMEN:  Micro  COUNTS:  NO endoscopic  TOURNIQUET:  * No tourniquets in log *  DICTATION: .Other Dictation: Dictation Number -  PLAN OF CARE: Admit to inpatient   PATIENT DISPOSITION:  PACU - hemodynamically stable.   Delay start of Pharmacological VTE agent (>24hrs) due to surgical blood loss or risk of bleeding: no

## 2020-11-19 NOTE — Transfer of Care (Signed)
Immediate Anesthesia Transfer of Care Note  Patient: Nathaniel Little  Procedure(s) Performed: FLEXIBLE BRONCHOSCOPY (N/A Chest)  Patient Location: PACU  Anesthesia Type:MAC  Level of Consciousness: awake, alert  and oriented  Airway & Oxygen Therapy: Patient Spontanous Breathing and Patient connected to face mask oxygen  Post-op Assessment: Report given to RN and Post -op Vital signs reviewed and stable  Post vital signs: Reviewed and stable  Last Vitals:  Vitals Value Taken Time  BP 141/75 11/19/20 1034  Temp    Pulse 74 11/19/20 1040  Resp 17 11/19/20 1040  SpO2 99 % 11/19/20 1040  Vitals shown include unvalidated device data.  Last Pain:  Vitals:   11/19/20 0800  TempSrc: Oral  PainSc: 0-No pain      Patients Stated Pain Goal: 0 (71/21/97 5883)  Complications: No complications documented.

## 2020-11-19 NOTE — Anesthesia Postprocedure Evaluation (Signed)
Anesthesia Post Note  Patient: Nathaniel Little  Procedure(s) Performed: FLEXIBLE BRONCHOSCOPY (N/A Chest)     Patient location during evaluation: PACU Anesthesia Type: MAC Level of consciousness: awake and alert Pain management: pain level controlled Vital Signs Assessment: post-procedure vital signs reviewed and stable Respiratory status: spontaneous breathing, nonlabored ventilation, respiratory function stable and patient connected to nasal cannula oxygen Cardiovascular status: stable and blood pressure returned to baseline Postop Assessment: no apparent nausea or vomiting Anesthetic complications: no   No complications documented.  Last Vitals:  Vitals:   11/19/20 1049 11/19/20 1105  BP: 125/63 126/62  Pulse: 72 70  Resp: (!) 22 15  Temp:  36.7 C  SpO2: 99% 99%    Last Pain:  Vitals:   11/19/20 1100  TempSrc:   PainSc: 0-No pain                 Lenia Housley,W. EDMOND

## 2020-11-19 NOTE — Progress Notes (Addendum)
Nathaniel Little       [place] [address]             [phone]      2 Days Post-Op Procedure(s) (LRB): XI ROBOTIC ASSISTED THORASCOPY-LEFT LOWER LOBECTOMY (Left) INTERCOSTAL NERVE BLOCK (Left) NODE DISSECTION (Left) Subjective: Clearing secretions better today  Objective: Vital signs in last 24 hours: Temp:  [97.7 F (36.5 C)-99 F (37.2 C)] 97.9 F (36.6 C) (02/16 0334) Pulse Rate:  [59-72] 66 (02/16 0700) Cardiac Rhythm: Normal sinus rhythm (02/16 0334) Resp:  [16-20] 19 (02/16 0700) BP: (100-155)/(56-75) 123/61 (02/16 0334) SpO2:  [81 %-99 %] 94 % (02/16 0700)  Hemodynamic parameters for last 24 hours:    Intake/Output from previous day: 02/15 0701 - 02/16 0700 In: 1440 [P.O.:1440] Out: 1870 [Urine:1250; Chest Tube:620] Intake/Output this shift: No intake/output data recorded.  General appearance: alert, cooperative and no distress Heart: regular rate and rhythm Lungs: fair air movement on left, right is clear Abdomen: benign Extremities: no edema or calf tenderness Wound: incis healing well  Lab Results: Recent Labs    11/18/20 0400 11/19/20 0040  WBC 7.8 8.9  HGB 8.2* 9.0*  HCT 24.4* 27.3*  PLT 112* 113*   BMET:  Recent Labs    11/18/20 0400 11/19/20 0040  NA 134* 133*  K 4.2 4.0  CL 102 102  CO2 26 21*  GLUCOSE 148* 169*  BUN 23 23  CREATININE 1.32* 1.36*  CALCIUM 8.0* 8.2*    PT/INR: No results for input(s): LABPROT, INR in the last 72 hours. ABG    Component Value Date/Time   PHART 7.432 11/14/2020 1505   HCO3 25.8 11/14/2020 1505   TCO2 28 04/01/2020 1137   ACIDBASEDEF 1.0 04/01/2020 0946   O2SAT 95.4 11/14/2020 1505   CBG (last 3)  Recent Labs    11/18/20 1600 11/18/20 2153 11/19/20 0602  GLUCAP 198* 197* 115*    Meds Scheduled Meds: . acetaminophen  1,000 mg Oral Q6H   Or  . acetaminophen (TYLENOL) oral liquid 160 mg/5 mL  1,000 mg Oral Q6H  . acetylcysteine  4 mL Nebulization TID  . aspirin  EC  81 mg Oral Daily  . atorvastatin  80 mg Oral Daily  . bisacodyl  10 mg Oral Daily  . carvedilol  6.25 mg Oral BID WC  . Chlorhexidine Gluconate Cloth  6 each Topical Daily  . cloNIDine  0.1 mg Oral BID  . enoxaparin (LOVENOX) injection  40 mg Subcutaneous Q24H  . ezetimibe  10 mg Oral Daily  . ferrous [id number] C-folic acid  1 capsule Oral BID PC  . fluticasone furoate-vilanterol  1 puff Inhalation Daily  . gabapentin  200 mg Oral QHS  . guaiFENesin  1,200 mg Oral BID  . insulin aspart  0-15 Units Subcutaneous TID WC  . insulin aspart  0-5 Units Subcutaneous QHS  . insulin glargine  20 Units Subcutaneous QHS  . metFORMIN  1,000 mg Oral BID WC  . pantoprazole  40 mg Oral BID  . senna-docusate  1 tablet Oral QHS  . traZODone  100 mg Oral QHS   Continuous Infusions: . sodium chloride 10 mL/hr at 11/18/20 0817   PRN Meds:.fentaNYL (SUBLIMAZE) injection, ipratropium-albuterol, ondansetron (ZOFRAN) IV, oxyCODONE, traMADol  Xrays DG CHEST PORT 1 VIEW  Result Date: 11/19/2020 CLINICAL DATA:  Chest tube, pneumothorax EXAM: PORTABLE CHEST 1 VIEW COMPARISON:  11/18/2020 FINDINGS: Left chest tube remains in place, unchanged. Near complete opacification of the  left hemithorax. Lucency over the left apex may reflect residual pneumothorax. Right lung clear. Prior TAVR. Right central line is unchanged. IMPRESSION: Left chest tube remains in place with near complete opacification of the left hemithorax and lucency over the left apex which may reflect residual pneumothorax. Electronically Signed   By: Rolm Baptise M.D.   On: 11/19/2020 07:03   DG CHEST PORT 1 VIEW  Result Date: 11/18/2020 CLINICAL DATA:  Pneumothorax EXAM: PORTABLE CHEST 1 VIEW COMPARISON:  Radiograph 11/18/2020 at 5:57 FINDINGS: Interval collapse of the LEFT upper lobe. Superior LEFT pneumothorax again noted. LEFT chest tube in place. RIGHT lung clear. PICC line unchanged IMPRESSION: 1. Moderate volume LEFT pneumothorax  with chest tube in place. 2. Interval collapse of the LEFT upper lobe. Electronically Signed   By: Suzy Bouchard M.D.   On: 11/18/2020 16:19   DG CHEST PORT 1 VIEW  Result Date: 11/18/2020 CLINICAL DATA:  Chest tube.  Prior lobectomy. EXAM: PORTABLE CHEST 1 VIEW COMPARISON:  11/17/2020. FINDINGS: Right central line and left chest tube in stable position. Postsurgical changes left lung again noted without interim change. Persistent bibasilar atelectasis. No prominent pleural effusion. Stable left-sided pleural thickening. No significant pneumothorax visualized. Prior cardiac valve replacement. Stable cardiomegaly. No pulmonary venous congestion. Left chest wall subcutaneous emphysema again noted. IMPRESSION: 1. Right central line and left chest tube in stable position. No significant pneumothorax visualized. Left chest wall subcutaneous emphysema again noted. 2. Postsurgical changes left lung again noted. Persistent bibasilar atelectasis. Electronically Signed   By: Marcello Moores  Register   On: 11/18/2020 08:00   DG Chest Port 1 View  Result Date: 11/17/2020 CLINICAL DATA:  Post LEFT lower lobectomy. EXAM: PORTABLE CHEST 1 VIEW COMPARISON:  November 14, 2020 FINDINGS: RIGHT IJ central venous access device terminates at the caval to atrial junction. LEFT-sided chest tube terminates near the LEFT upper chest at the level of the aortic arch, just lateral to the aortic arch. Leads project over the chest. Signs of trans arterial aortic valve replacement as before. Chain sutures along the LEFT mediastinal border related to LEFT lower lobectomy. Cardiomediastinal contours are otherwise unchanged. No visible pneumothorax with LEFT-sided chest tube in place. RIGHT lung is clear. Retrocardiac opacity on the LEFT. On limited assessment no acute skeletal process. IMPRESSION: 1. Expected postoperative appearance of the chest post LEFT lower lobectomy. No visible pneumothorax. 2. LEFT lower lobe density, retrocardiac may  represent atelectasis, attention on follow-up. 3. RIGHT IJ central venous access device in place. Electronically Signed   By: Zetta Bills M.D.   On: 11/17/2020 12:19    Assessment/Plan: S/P Procedure(s) (LRB): XI ROBOTIC ASSISTED THORASCOPY-LEFT LOWER LOBECTOMY (Left) INTERCOSTAL NERVE BLOCK (Left) NODE DISSECTION (Left)   1 afeb, VSS with some HTN readings- on home HTN meds, follow but may need adjustments 2 sats ok on 7 l HFNC 3 CXR with opacification on left, uncertain if pntx- may need to consider bronchoscopy 4 H/H improved 5 creat  Stable- good UOP 6 CT 620 cc 24/h- no air leak currently with cough, keep for now for drainage(serosang), H2O seal 7 cont aggressive pulm toilet/nebs, etc, ambulate 8 DVT PPX - lovenox 9 path pending     LOS: 2 days    John Giovanni PA-C Pager [phone] 11/19/2020 Patient seen and examined, CXR reviewed. CXR slightly improved but there is still extensive left upper lobe atelectasis. Will plan bronchoscopy to clear mucous plugging. I informed Mr. Cassar of the indications, risks, benefits and alternatives. He accepts the  risks and agrees to proceed.  Revonda Standard Roxan Hockey, MD [facility] [phone]

## 2020-11-20 ENCOUNTER — Encounter (HOSPITAL_COMMUNITY): Payer: Self-pay | Admitting: Thoracic Surgery (Cardiothoracic Vascular Surgery)

## 2020-11-20 ENCOUNTER — Inpatient Hospital Stay (HOSPITAL_COMMUNITY): Payer: Medicare Other

## 2020-11-20 LAB — GLUCOSE, CAPILLARY
Glucose-Capillary: 103 mg/dL — ABNORMAL HIGH (ref 70–99)
Glucose-Capillary: 145 mg/dL — ABNORMAL HIGH (ref 70–99)
Glucose-Capillary: 103 mg/dL — ABNORMAL HIGH (ref 70–99)
Glucose-Capillary: 126 mg/dL — ABNORMAL HIGH (ref 70–99)

## 2020-11-20 LAB — ACID FAST SMEAR (AFB, MYCOBACTERIA): Acid Fast Smear: NEGATIVE

## 2020-11-20 LAB — BASIC METABOLIC PANEL
Anion gap: 8 (ref 5–15)
BUN: 23 mg/dL (ref 8–23)
CO2: 25 mmol/L (ref 22–32)
Calcium: 8.5 mg/dL — ABNORMAL LOW (ref 8.9–10.3)
Chloride: 104 mmol/L (ref 98–111)
Creatinine, Ser: 1.2 mg/dL (ref 0.61–1.24)
GFR, Estimated: >60 mL/min (ref >60)
Glucose, Bld: 102 mg/dL — ABNORMAL HIGH (ref 70–99)
Potassium: 3.9 mmol/L (ref 3.5–5.1)
Sodium: 137 mmol/L (ref 135–145)

## 2020-11-20 MED ORDER — CLOPIDOGREL BISULFATE 75 MG PO TABS
75.0000 mg | ORAL_TABLET | Freq: Every day | ORAL | Status: DC
  Administered 2020-11-20 – 2020-11-23 (×4): 75 mg via ORAL
  Filled 2020-11-20 (×4): qty 1

## 2020-11-20 MED ORDER — POLYETHYLENE GLYCOL 3350 17 G PO PACK
17.0000 g | PACK | Freq: Once | ORAL | Status: AC
  Administered 2020-11-20: 17 g via ORAL
  Filled 2020-11-20: qty 1

## 2020-11-20 MED ORDER — CLONIDINE HCL 0.1 MG PO TABS
0.2000 mg | ORAL_TABLET | Freq: Two times a day (BID) | ORAL | Status: DC
  Administered 2020-11-20 – 2020-11-23 (×7): 0.2 mg via ORAL
  Filled 2020-11-20 (×7): qty 2

## 2020-11-20 MED ORDER — SODIUM CHLORIDE 0.9 % IV SOLN
2.0000 g | Freq: Three times a day (TID) | INTRAVENOUS | Status: DC
  Administered 2020-11-20 – 2020-11-21 (×4): 2 g via INTRAVENOUS
  Filled 2020-11-20 (×4): qty 2

## 2020-11-20 NOTE — Plan of Care (Signed)
  Problem: Education: Goal: Knowledge of General Education information will improve Description: Including pain rating scale, medication(s)/side effects and non-pharmacologic comfort measures Outcome: Progressing   Problem: Health Behavior/Discharge Planning: Goal: Ability to manage health-related needs will improve Outcome: Progressing   Problem: Clinical Measurements: Goal: Ability to maintain clinical measurements within normal limits will improve Outcome: Progressing   Problem: Clinical Measurements: Goal: Respiratory complications will improve Outcome: Progressing   Problem: Activity: Goal: Risk for activity intolerance will decrease Outcome: Progressing   Problem: Nutrition: Goal: Adequate nutrition will be maintained Outcome: Progressing   Problem: Elimination: Goal: Will not experience complications related to bowel motility Outcome: Progressing   Problem: Pain Managment: Goal: General experience of comfort will improve Outcome: Progressing   Problem: Skin Integrity: Goal: Risk for impaired skin integrity will decrease Outcome: Progressing

## 2020-11-20 NOTE — Op Note (Signed)
Nathaniel Little, Nathaniel Little MEDICAL RECORD [id number] ACCOUNT [id number] DATE OF BIRTH:Sep 11, 1946 FACILITY: [facility] LOCATION: [facility] PHYSICIAN:Otho Michalik Chaya Jan, MD  OPERATIVE REPORT  DATE OF PROCEDURE:  11/19/2020  PREOPERATIVE DIAGNOSIS:  Left upper lobe atelectasis secondary to mucus plugging.  POSTOPERATIVE DIAGNOSIS:  Left upper lobe atelectasis secondary to mucus plugging.  PROCEDURE:  Flexible fiberoptic bronchoscopy.  ANESTHESIA:  Local with intravenous sedation.  FINDINGS:  Extensive thick mucus throughout left mainstem. Mucosa of the left mainstem was friable.  No endobronchial lesions.  CLINICAL NOTE:  [name] is a 75 year old gentleman who had undergone a left lower lobectomy for a non-small cell carcinoma on 11/17/2020.  He developed complete atelectasis of the left upper lobe on the afternoon of 11/18/2020.  Despite noninvasive  measures, he continued to have severe atelectasis and was advised to undergo bronchoscopy for clearing of mucus plugs.  The indications, risks, benefits, and alternatives were discussed in detail with the patient.  He understood and accepted the  risks and agreed to proceed.  DESCRIPTION OF PROCEDURE:  [name] was brought to the operating room on 11/19/2020.  He was given intravenous sedation and monitored by the Anesthesia service.  The timeout was performed.  The oropharynx was numbed with Cetacaine spray.  A bite block  was placed.  Flexible fiberoptic bronchoscopy was performed.  When the bronchoscope was just above the cords, there was some mucus in the area, which was cleared with suctioning and then 3 mL of 1% lidocaine was injected directly on to the cords.  The  scope was withdrawn and then after waiting several minutes for the lidocaine to take effect, the scope was reinserted.  The trachea was easily cannulated and the scope was advanced into the airway.  There was thick white mucus throughout the left mainstem.  The bronchus was occluded.   The mucous was  cleared with suctioning.  There was some mild coughing, the scope was advanced further distally.  The left lower lobe staple line was intact.  There was red friable mucosa in the distal left main stem and at the origins of the left upper lobe bronchus.   The area of the left upper lobe were copiously irrigated with saline and also dilute epinephrine solution and as much mucus was cleared as possible.  A final inspection then was made, some spill over was cleared from the right airways and the bronchoscope  was withdrawn.  The patient was taken from the operating room to the [facility] in good condition.  HN/NUANCE  D:11/19/2020 T:11/20/2020 JOB:[id number]

## 2020-11-20 NOTE — TOC Transition Note (Signed)
Transition of Care [facility]) - CM/SW Discharge Note   Patient Details  Name: Nathaniel Little MRN: [id number] Date of Birth: May 31, 1946  [facility] CM/SW Contact:  Zenon Mayo, RN Phone Number: 11/20/2020, 3:26 PM   Clinical Narrative:    Patient from home alone, , indep, will need home oxygen, Patient states it is ok for [employer] to provide home oxygen for him.  NCM made referral to [facility], and informed staff RN that we need ambulatory sats.  The oxygen order is in.   Final next level of care: Home/Self Care Barriers to Discharge: No Barriers Identified   Patient Goals and CMS Choice Patient states their goals for this hospitalization and ongoing recovery are:: get better   Choice offered to / list presented to : NA  Discharge Placement                       Discharge Plan and Services                DME Arranged: Oxygen DME Agency: [employer] Date DME Agency Contacted: 11/20/20 Time DME Agency Contacted: [id number] Representative spoke with at DME Agency: [facility] HH Arranged: NA          Social Determinants of Health ([place]) Interventions     Readmission Risk Interventions No flowsheet data found.

## 2020-11-20 NOTE — Progress Notes (Signed)
Pharmacy Antibiotic Note  Nathaniel Little is a 75 y.o. male admitted on 11/17/2020 with pneumonia.  Pharmacy has been consulted for vancomycin dosing.  Patient currently afebrile, wbc 8.9, scr slightly elevated at 1.36> improved 1.2 BL 0.8. Cefepime added will increase to q8g given crcl >4m/min, now adding vancomycin. BAL done earlier today.   Vancomycin 1g IV Q 24 hrs. Goal AUC 400-550. Expected AUC: 485 SCr used: 1.36  Plan: Vancomycin 2g IV now then 1g IV every 24 hours.  Goal trough 15-20 mcg/mL. Cefepime 2g q8hr   Height: _0  (175.3 cm) Weight: 95.3 kg (210 lb) IBW/kg (Calculated) : 70.7  Temp (24hrs), Avg:98 F (36.7 C), Min:97.5 F (36.4 C), Max:98.8 F (37.1 C)  Recent Labs  Lab 11/18/20 0400 11/19/20 0040 11/20/20 0315  WBC 7.8 8.9  --   CREATININE 1.32* 1.36* 1.20    Estimated Creatinine Clearance: 60.6 mL/min (by C-G formula based on SCr of 1.2 mg/dL).    Allergies  Allergen Reactions  . Codeine Itching    LBonnita NasutiPharm.D. CPP, BCPS Clinical Pharmacist [phone]/17/2022 8:26 AM

## 2020-11-20 NOTE — Progress Notes (Addendum)
[address]       [place] [address]             [phone]      1 Day Post-Op Procedure(s) (LRB): FLEXIBLE BRONCHOSCOPY (N/A) Subjective: Feeling better, some 'wheezing but breathing is better'  Objective: Vital signs in last 24 hours: Temp:  [97.6 F (36.4 C)-98.8 F (37.1 C)] 97.8 F (36.6 C) (02/17 0328) Pulse Rate:  [60-89] 60 (02/17 0328) Cardiac Rhythm: Normal sinus rhythm (02/17 0328) Resp:  [15-22] 17 (02/17 0328) BP: (106-178)/(55-77) 116/69 (02/17 0328) SpO2:  [86 %-99 %] 96 % (02/17 0328) FiO2 (%):  [32 %] 32 % (02/16 1309) Weight:  [95.3 kg] 95.3 kg (02/16 0930)  Hemodynamic parameters for last 24 hours:    Intake/Output from previous day: 02/16 0701 - 02/17 0700 In: 1644.7 [P.O.:480; I.V.:1064.7; IV Piggyback:100] Out: 2290 [Urine:1900; Chest Tube:390] Intake/Output this shift: No intake/output data recorded.  General appearance: alert, cooperative and no distress Heart: regular rate and rhythm Lungs: coarse BS with scattered wheeze, all improve with cough Abdomen: non tender Extremities: no edema or calf tenderness Wound: incis healing well  Lab Results: Recent Labs    11/18/20 0400 11/19/20 0040  WBC 7.8 8.9  HGB 8.2* 9.0*  HCT 24.4* 27.3*  PLT 112* 113*   BMET:  Recent Labs    11/19/20 0040 11/20/20 0315  NA 133* 137  K 4.0 3.9  CL 102 104  CO2 21* 25  GLUCOSE 169* 102*  BUN 23 23  CREATININE 1.36* 1.20  CALCIUM 8.2* 8.5*    PT/INR: No results for input(s): LABPROT, INR in the last 72 hours. ABG    Component Value Date/Time   PHART 7.432 11/14/2020 1505   HCO3 25.8 11/14/2020 1505   TCO2 28 04/01/2020 1137   ACIDBASEDEF 1.0 04/01/2020 0946   O2SAT 95.4 11/14/2020 1505   CBG (last 3)  Recent Labs    11/19/20 1604 11/19/20 2124 11/20/20 0628  GLUCAP 115* 137* 103*    Meds Scheduled Meds: . acetaminophen  1,000 mg Oral Q6H   Or  . acetaminophen (TYLENOL) oral liquid 160 mg/5 mL  1,000 mg  Oral Q6H  . acetylcysteine  4 mL Nebulization TID  . aspirin EC  81 mg Oral Daily  . atorvastatin  80 mg Oral Daily  . bisacodyl  10 mg Oral Daily  . carvedilol  6.25 mg Oral BID WC  . Chlorhexidine Gluconate Cloth  6 each Topical Daily  . cloNIDine  0.1 mg Oral BID  . enoxaparin (LOVENOX) injection  40 mg Subcutaneous Q24H  . ezetimibe  10 mg Oral Daily  . ferrous [id number] C-folic acid  1 capsule Oral BID PC  . fluticasone furoate-vilanterol  1 puff Inhalation Daily  . gabapentin  200 mg Oral QHS  . guaiFENesin  1,200 mg Oral BID  . insulin aspart  0-15 Units Subcutaneous TID WC  . insulin aspart  0-5 Units Subcutaneous QHS  . insulin glargine  20 Units Subcutaneous QHS  . metFORMIN  1,000 mg Oral BID WC  . pantoprazole  40 mg Oral BID  . senna-docusate  1 tablet Oral QHS  . traZODone  100 mg Oral QHS   Continuous Infusions: . sodium chloride 10 mL/hr at 11/19/20 2200  . ceFEPime (MAXIPIME) IV 2 g (11/19/20 2119)  . lactated ringers 10 mL/hr at 11/19/20 0928  . vancomycin     PRN Meds:.fentaNYL (SUBLIMAZE) injection, ipratropium-albuterol, ondansetron (ZOFRAN) IV, oxyCODONE, traMADol  Xrays DG Chest Port 1 View  Result Date: 11/19/2020 CLINICAL DATA:  Status post bronchoscopy. EXAM: PORTABLE CHEST 1 VIEW COMPARISON:  Same day. FINDINGS: Stable cardiomegaly. Left-sided chest tube is noted without pneumothorax. Stable left lung atelectasis or infiltrate is noted. Right lung is clear. Right internal jugular catheter is unchanged. Bony thorax is unremarkable. IMPRESSION: Stable left-sided chest tube without pneumothorax. Stable left lung atelectasis or infiltrate. No other significant abnormality seen. Electronically Signed   By: Marijo Conception M.D.   On: 11/19/2020 11:29   DG CHEST PORT 1 VIEW  Result Date: 11/19/2020 CLINICAL DATA:  Chest tube, pneumothorax EXAM: PORTABLE CHEST 1 VIEW COMPARISON:  11/18/2020 FINDINGS: Left chest tube remains in place, unchanged. Near  complete opacification of the left hemithorax. Lucency over the left apex may reflect residual pneumothorax. Right lung clear. Prior TAVR. Right central line is unchanged. IMPRESSION: Left chest tube remains in place with near complete opacification of the left hemithorax and lucency over the left apex which may reflect residual pneumothorax. Electronically Signed   By: Rolm Baptise M.D.   On: 11/19/2020 07:03   DG CHEST PORT 1 VIEW  Result Date: 11/18/2020 CLINICAL DATA:  Pneumothorax EXAM: PORTABLE CHEST 1 VIEW COMPARISON:  Radiograph 11/18/2020 at 5:57 FINDINGS: Interval collapse of the LEFT upper lobe. Superior LEFT pneumothorax again noted. LEFT chest tube in place. RIGHT lung clear. PICC line unchanged IMPRESSION: 1. Moderate volume LEFT pneumothorax with chest tube in place. 2. Interval collapse of the LEFT upper lobe. Electronically Signed   By: Suzy Bouchard M.D.   On: 11/18/2020 16:19   Results for orders placed or performed during the hospital encounter of 11/17/20  Anaerobic culture w Gram Stain     Status: None (Preliminary result)   Collection Time: 11/19/20 10:15 AM   Specimen: Bronchial Alveolar Lavage; Respiratory  Result Value Ref Range Status   Specimen Description BRONCHIAL ALVEOLAR LAVAGE  Final   Special Requests BAL SPEC A  Final   Gram Stain   Final    ABUNDANT WBC PRESENT, PREDOMINANTLY PMN RARE GRAM POSITIVE COCCI IN PAIRS IN CLUSTERS Performed at [facility], [address] [place], [place] [address]    Culture PENDING  Incomplete   Report Status PENDING  Incomplete    Assessment/Plan: S/P Procedure(s) (LRB): FLEXIBLE BRONCHOSCOPY (N/A)  1 afeb, VSS with s BP quite variable 106 - 178 range, will increase clonidine dose 2 sats ok on 2 liters- conts aggressive pulm toilet/nebs 3 renal fxn improved with creat 1.20- good UOP 4 cx pending, on vanco/maxepime 5 BS well controlled 6 CT390 cc/ 24 h- serosang,  keep for now 7 lovenox for DVT PPX, uncertain  if should restart Plavix 8 routine rehab  LOS: 3 days    John Giovanni  PA-C Pager [phone]  Looks much better today and CXR significantly improved No air leak but moderate drainage frm CT- will leave one more day Ambulate DC central line  Remo Lipps C. Roxan Hockey, MD [facility] [phone]

## 2020-11-21 ENCOUNTER — Inpatient Hospital Stay (HOSPITAL_COMMUNITY): Payer: Medicare Other

## 2020-11-21 LAB — CBC WITH DIFFERENTIAL/PLATELET
Abs Immature Granulocytes: 0.05 10*3/uL (ref 0.00–0.07)
Basophils Absolute: 0 10*3/uL (ref 0.0–0.1)
Basophils Relative: 0 %
Eosinophils Absolute: 0.3 10*3/uL (ref 0.0–0.5)
Eosinophils Relative: 3 %
HCT: 26.3 % — ABNORMAL LOW (ref 39.0–52.0)
Hemoglobin: 8.5 g/dL — ABNORMAL LOW (ref 13.0–17.0)
Immature Granulocytes: 1 %
Lymphocytes Relative: 13 %
Lymphs Abs: 1.2 10*3/uL (ref 0.7–4.0)
MCH: 29 pg (ref 26.0–34.0)
MCHC: 32.3 g/dL (ref 30.0–36.0)
MCV: 89.8 fL (ref 80.0–100.0)
Monocytes Absolute: 0.9 10*3/uL (ref 0.1–1.0)
Monocytes Relative: 9 %
Neutro Abs: 7.4 10*3/uL (ref 1.7–7.7)
Neutrophils Relative %: 74 %
Platelets: 132 10*3/uL — ABNORMAL LOW (ref 150–400)
RBC: 2.93 MIL/uL — ABNORMAL LOW (ref 4.22–5.81)
RDW: 17.7 % — ABNORMAL HIGH (ref 11.5–15.5)
WBC: 9.9 10*3/uL (ref 4.0–10.5)
nRBC: 0 % (ref 0.0–0.2)

## 2020-11-21 LAB — GLUCOSE, CAPILLARY
Glucose-Capillary: 125 mg/dL — ABNORMAL HIGH (ref 70–99)
Glucose-Capillary: 121 mg/dL — ABNORMAL HIGH (ref 70–99)
Glucose-Capillary: 166 mg/dL — ABNORMAL HIGH (ref 70–99)
Glucose-Capillary: 103 mg/dL — ABNORMAL HIGH (ref 70–99)

## 2020-11-21 LAB — CULTURE, RESPIRATORY W GRAM STAIN: Culture: NORMAL

## 2020-11-21 MED ORDER — LACTULOSE 10 GM/15ML PO SOLN
30.0000 g | Freq: Every day | ORAL | Status: DC | PRN
  Administered 2020-11-21 – 2020-11-22 (×2): 30 g via ORAL
  Filled 2020-11-21 (×2): qty 45

## 2020-11-21 MED ORDER — AMOXICILLIN-POT CLAVULANATE 875-125 MG PO TABS
1.0000 | ORAL_TABLET | Freq: Two times a day (BID) | ORAL | Status: DC
  Administered 2020-11-21 – 2020-11-23 (×5): 1 via ORAL
  Filled 2020-11-21 (×5): qty 1

## 2020-11-21 NOTE — Plan of Care (Signed)

## 2020-11-21 NOTE — Progress Notes (Addendum)
[address]       [place] [address]             [phone]      2 Days Post-Op Procedure(s) (LRB): FLEXIBLE BRONCHOSCOPY (N/A) Subjective: C/o constipation , will escalate meds. O/W conts to feel better and clearing secretions   Objective: Vital signs in last 24 hours: Temp:  [97.5 F (36.4 C)-97.9 F (36.6 C)] 97.9 F (36.6 C) (02/18 0317) Pulse Rate:  [60-73] 60 (02/18 0317) Cardiac Rhythm: Normal sinus rhythm;Heart block (02/18 0700) Resp:  [15-20] 18 (02/18 0317) BP: (105-140)/(61-70) 110/61 (02/18 0317) SpO2:  [92 %-96 %] 94 % (02/18 0317)  Hemodynamic parameters for last 24 hours:    Intake/Output from previous day: 02/17 0701 - 02/18 0700 In: 1338.2 [P.O.:600; I.V.:238.2; IV Piggyback:500] Out: 790 [Urine:600; Chest Tube:190] Intake/Output this shift: No intake/output data recorded.  General appearance: alert, cooperative and no distress Heart: regular rate and rhythm Lungs: scattered wheeze and coarse, dim in lower fields Abdomen: benign Extremities: minor edema, chronic enous stasis changes Wound: incis healing well  Lab Results: Recent Labs    11/19/20 0040 11/21/20 0034  WBC 8.9 9.9  HGB 9.0* 8.5*  HCT 27.3* 26.3*  PLT 113* 132*   BMET:  Recent Labs    11/19/20 0040 11/20/20 0315  NA 133* 137  K 4.0 3.9  CL 102 104  CO2 21* 25  GLUCOSE 169* 102*  BUN 23 23  CREATININE 1.36* 1.20  CALCIUM 8.2* 8.5*    PT/INR: No results for input(s): LABPROT, INR in the last 72 hours. ABG    Component Value Date/Time   PHART 7.432 11/14/2020 1505   HCO3 25.8 11/14/2020 1505   TCO2 28 04/01/2020 1137   ACIDBASEDEF 1.0 04/01/2020 0946   O2SAT 95.4 11/14/2020 1505   CBG (last 3)  Recent Labs    11/20/20 1624 11/20/20 2112 11/21/20 0611  GLUCAP 103* 126* 125*    Meds Scheduled Meds: . acetaminophen  1,000 mg Oral Q6H   Or  . acetaminophen (TYLENOL) oral liquid 160 mg/5 mL  1,000 mg Oral Q6H  . acetylcysteine  4  mL Nebulization TID  . aspirin EC  81 mg Oral Daily  . atorvastatin  80 mg Oral Daily  . bisacodyl  10 mg Oral Daily  . carvedilol  6.25 mg Oral BID WC  . Chlorhexidine Gluconate Cloth  6 each Topical Daily  . cloNIDine  0.2 mg Oral BID  . clopidogrel  75 mg Oral Daily  . enoxaparin (LOVENOX) injection  40 mg Subcutaneous Q24H  . ezetimibe  10 mg Oral Daily  . ferrous [id number] C-folic acid  1 capsule Oral BID PC  . fluticasone furoate-vilanterol  1 puff Inhalation Daily  . gabapentin  200 mg Oral QHS  . guaiFENesin  1,200 mg Oral BID  . insulin aspart  0-15 Units Subcutaneous TID WC  . insulin aspart  0-5 Units Subcutaneous QHS  . insulin glargine  20 Units Subcutaneous QHS  . metFORMIN  1,000 mg Oral BID WC  . pantoprazole  40 mg Oral BID  . senna-docusate  1 tablet Oral QHS  . traZODone  100 mg Oral QHS   Continuous Infusions: . sodium chloride 10 mL/hr at 11/19/20 2200  . ceFEPime (MAXIPIME) IV 2 g (11/21/20 0616)  . lactated ringers 10 mL/hr at 11/19/20 0928  . vancomycin 1,000 mg (11/20/20 1647)   PRN Meds:.fentaNYL (SUBLIMAZE) injection, ipratropium-albuterol, ondansetron (ZOFRAN) IV, oxyCODONE, traMADol  Xrays DG CHEST PORT 1 VIEW  Result Date: 11/20/2020 CLINICAL DATA:  Chest tube, follow-up EXAM: PORTABLE CHEST 1 VIEW COMPARISON:  Portable exam 0646 hours compared to 11/19/2020 FINDINGS: LEFT thoracostomy tube unchanged. RIGHT jugular line with tip projecting over SVC. Upper normal size of cardiac silhouette post TAVR. Atherosclerotic calcification aorta. Persistent volume loss in LEFT hemithorax with improved atelectasis in LEFT lung since previous exam. Small LEFT apex pneumothorax identified. Minimal atelectasis at RIGHT base, RIGHT lung otherwise clear. IMPRESSION: Improved atelectasis in LEFT lung. Small LEFT apex pneumothorax despite thoracostomy tube. Electronically Signed   By: Lavonia Dana M.D.   On: 11/20/2020 08:28   DG Chest Port 1 View  Result  Date: 11/19/2020 CLINICAL DATA:  Status post bronchoscopy. EXAM: PORTABLE CHEST 1 VIEW COMPARISON:  Same day. FINDINGS: Stable cardiomegaly. Left-sided chest tube is noted without pneumothorax. Stable left lung atelectasis or infiltrate is noted. Right lung is clear. Right internal jugular catheter is unchanged. Bony thorax is unremarkable. IMPRESSION: Stable left-sided chest tube without pneumothorax. Stable left lung atelectasis or infiltrate. No other significant abnormality seen. Electronically Signed   By: Marijo Conception M.D.   On: 11/19/2020 11:29    Assessment/Plan: S/P Procedure(s) (LRB): FLEXIBLE BRONCHOSCOPY (N/A)  1 afeb, VSS, BP improved 2 sats 94% on 2l  3 no leukocytosis 4 H/H fairly stable, normal diff 5 thrombocytopenia improved, chronic 6 190 cc/24 h from CT- poss d/c today 7 CXR appears fairly stable in terms of opacities- conts current ABX  8  Cont pulm rx and toilet/rehab     LOS: 4 days    John Giovanni PA-C Pager [phone] 11/21/2020 Patient seen and examined, agree with above No air leak, drainage from CT is decreased- dc chest tube Change to PO antibiotics Wean O2 as tolerated Increase ambulation Path- T1N0 adenocarcinoma  Remo Lipps C. Roxan Hockey, MD [facility] [phone]

## 2020-11-21 NOTE — Care Management Important Message (Signed)
Important Message  Patient Details  Name: Nathaniel Little MRN: [id number] Date of Birth: [birth year]   Medicare Important Message Given:  Yes     Alazia Crocket [address]ine Circle 11/21/2020, 1:58 PM

## 2020-11-21 NOTE — Progress Notes (Signed)
   Patient Saturations on 3 Liters of oxygen while Ambulating = 94 %

## 2020-11-21 NOTE — Progress Notes (Signed)
SATURATION QUALIFICATIONS: (This note is used to comply with regulatory documentation for home oxygen)  Patient Saturations on Room Air at Rest = 91%  Patient Saturations on Room Air while Ambulating = 88%  Patient Saturations on 2 Liters of oxygen while Ambulating = 87%  Please briefly explain why patient needs home oxygen: Patient's oxygen levels desaturate with exertion and ambulation.

## 2020-11-22 ENCOUNTER — Inpatient Hospital Stay (HOSPITAL_COMMUNITY): Payer: Medicare Other

## 2020-11-22 LAB — GLUCOSE, CAPILLARY
Glucose-Capillary: 101 mg/dL — ABNORMAL HIGH (ref 70–99)
Glucose-Capillary: 78 mg/dL (ref 70–99)
Glucose-Capillary: 108 mg/dL — ABNORMAL HIGH (ref 70–99)

## 2020-11-22 NOTE — Plan of Care (Signed)

## 2020-11-22 NOTE — Progress Notes (Addendum)
[address]       [place] [address]             [phone]      3 Days Post-Op Procedure(s) (LRB): FLEXIBLE BRONCHOSCOPY (N/A) Subjective: Feels better  Objective: Vital signs in last 24 hours: Temp:  [97.8 F (36.6 C)-98.9 F (37.2 C)] 97.8 F (36.6 C) (02/19 0712) Pulse Rate:  [60-70] 70 (02/19 0712) Cardiac Rhythm: Normal sinus rhythm (02/19 0702) Resp:  [17-19] 17 (02/19 0712) BP: (103-125)/(45-77) 119/54 (02/19 0712) SpO2:  [95 %-99 %] 98 % (02/19 0758)  Hemodynamic parameters for last 24 hours:    Intake/Output from previous day: 02/18 0701 - 02/19 0700 In: -  Out: 645 [Urine:625; Chest Tube:20] Intake/Output this shift: No intake/output data recorded.  General appearance: alert, cooperative and no distress Heart: regular rate and rhythm Lungs: dim left base Abdomen: benign Extremities: stasis changes, minor edema Wound: incis healing well  Lab Results: Recent Labs    11/21/20 0034  WBC 9.9  HGB 8.5*  HCT 26.3*  PLT 132*   BMET:  Recent Labs    11/20/20 0315  NA 137  K 3.9  CL 104  CO2 25  GLUCOSE 102*  BUN 23  CREATININE 1.20  CALCIUM 8.5*    PT/INR: No results for input(s): LABPROT, INR in the last 72 hours. ABG    Component Value Date/Time   PHART 7.432 11/14/2020 1505   HCO3 25.8 11/14/2020 1505   TCO2 28 04/01/2020 1137   ACIDBASEDEF 1.0 04/01/2020 0946   O2SAT 95.4 11/14/2020 1505   CBG (last 3)  Recent Labs    11/21/20 1639 11/21/20 2110 11/22/20 0638  GLUCAP 166* 103* 101*    Meds Scheduled Meds: . acetaminophen  1,000 mg Oral Q6H   Or  . acetaminophen (TYLENOL) oral liquid 160 mg/5 mL  1,000 mg Oral Q6H  . acetylcysteine  4 mL Nebulization TID  . amoxicillin-clavulanate  1 tablet Oral Q12H  . aspirin EC  81 mg Oral Daily  . atorvastatin  80 mg Oral Daily  . bisacodyl  10 mg Oral Daily  . carvedilol  6.25 mg Oral BID WC  . cloNIDine  0.2 mg Oral BID  . clopidogrel  75 mg Oral Daily   . enoxaparin (LOVENOX) injection  40 mg Subcutaneous Q24H  . ezetimibe  10 mg Oral Daily  . ferrous [id number] C-folic acid  1 capsule Oral BID PC  . fluticasone furoate-vilanterol  1 puff Inhalation Daily  . gabapentin  200 mg Oral QHS  . guaiFENesin  1,200 mg Oral BID  . insulin aspart  0-15 Units Subcutaneous TID WC  . insulin aspart  0-5 Units Subcutaneous QHS  . insulin glargine  20 Units Subcutaneous QHS  . metFORMIN  1,000 mg Oral BID WC  . pantoprazole  40 mg Oral BID  . senna-docusate  1 tablet Oral QHS  . traZODone  100 mg Oral QHS   Continuous Infusions: . sodium chloride 10 mL/hr at 11/19/20 2200  . lactated ringers 10 mL/hr at 11/19/20 0928   PRN Meds:.fentaNYL (SUBLIMAZE) injection, ipratropium-albuterol, lactulose, ondansetron (ZOFRAN) IV, oxyCODONE, traMADol  Xrays DG Chest 2 View  Result Date: 11/22/2020 CLINICAL DATA:  Status post left lower lobectomy. EXAM: CHEST - 2 VIEW COMPARISON:  11/21/2020 FINDINGS: Stable cardiomediastinal contours. Status post left lower lobectomy with left lung volume loss. The left chest tube has been removed. There is a small pneumothorax identified within the left apex  which measures approximately 1.1 cm in thickness. Left pleural effusion and left base atelectasis disease is unchanged. IMPRESSION: 1. Status post left lower lobectomy with small left apical pneumothorax. Recommend attention on follow-up imaging. 2. Stable left pleural effusion and left base atelectasis/airspace disease. These results will be called to the ordering clinician or representative by the Radiologist Assistant, and communication documented in the PACS or [employer]. Electronically Signed   By: Kerby Moors M.D.   On: 11/22/2020 08:25   DG Chest 1V REPEAT Same Day  Result Date: 11/21/2020 CLINICAL DATA:  Status post left lung lobectomy. EXAM: CHEST - 1 VIEW SAME DAY COMPARISON:  Same day. FINDINGS: Stable cardiomegaly. Left-sided chest tube has  been removed. No pneumothorax is noted. Right lung is clear. Stable left basilar atelectasis is noted. Bony thorax is unremarkable. IMPRESSION: Status post removal of left-sided chest tube. No pneumothorax is noted. Electronically Signed   By: Marijo Conception M.D.   On: 11/21/2020 12:48   DG CHEST PORT 1 VIEW  Result Date: 11/21/2020 CLINICAL DATA:  Chest tube, pneumothorax, shortness of breath EXAM: PORTABLE CHEST 1 VIEW COMPARISON:  11/20/2020 FINDINGS: Left chest tube remains in place. No visible pneumothorax. Changes of TAVR. Heart is borderline in size. Bibasilar atelectasis or infiltrates, left greater than right, unchanged. IMPRESSION: No significant change since prior study.  No pneumothorax. Electronically Signed   By: Rolm Baptise M.D.   On: 11/21/2020 07:48   DG Abd 2 Views  Result Date: 11/22/2020 CLINICAL DATA:  Status post LEFT LOWER lobectomy for non-small cell carcinoma. EXAM: ABDOMEN - 2 VIEW COMPARISON:  11/21/2020 and prior studies FINDINGS: Mild gaseous distension of small bowel and colon noted. Gas and stool within the colon is identified. There is no evidence of pneumoperitoneum. LEFT LOWER lung airspace disease/atelectasis and LEFT pleural effusion noted. IMPRESSION: Mild gaseous distension of small bowel and colon which may represent ileus. Electronically Signed   By: Margarette Canada M.D.   On: 11/22/2020 08:20    Assessment/Plan: S/P Procedure(s) (LRB): FLEXIBLE BRONCHOSCOPY (N/A)  1 afeb, VSS 2 sats good on 2 liters 3 CXR is fairly stable in appearance 4  AXR- minor ileus, had 3 BM yesterday 5 now on augmentin po  6 conts pulm RX/toilet- may need home O2 7 poss home 1-2 days     LOS: 5 days    John Giovanni PA-C Pager [phone] 11/22/2020  Likely home 1-2 days - home on o2 I have seen and examined [name] and agree with the above assessment  and plan.  Grace Isaac MD Beeper [phone] Office [phone] 11/22/2020 9:15 AM

## 2020-11-23 ENCOUNTER — Inpatient Hospital Stay (HOSPITAL_COMMUNITY): Payer: Medicare Other

## 2020-11-23 LAB — CBC WITH DIFFERENTIAL/PLATELET
Abs Immature Granulocytes: 0.03 10*3/uL (ref 0.00–0.07)
Basophils Absolute: 0 10*3/uL (ref 0.0–0.1)
Basophils Relative: 1 %
Eosinophils Absolute: 0.3 10*3/uL (ref 0.0–0.5)
Eosinophils Relative: 4 %
HCT: 25.3 % — ABNORMAL LOW (ref 39.0–52.0)
Hemoglobin: 8.5 g/dL — ABNORMAL LOW (ref 13.0–17.0)
Immature Granulocytes: 0 %
Lymphocytes Relative: 18 %
Lymphs Abs: 1.4 10*3/uL (ref 0.7–4.0)
MCH: 29.9 pg (ref 26.0–34.0)
MCHC: 33.6 g/dL (ref 30.0–36.0)
MCV: 89.1 fL (ref 80.0–100.0)
Monocytes Absolute: 0.6 10*3/uL (ref 0.1–1.0)
Monocytes Relative: 8 %
Neutro Abs: 5.7 10*3/uL (ref 1.7–7.7)
Neutrophils Relative %: 69 %
Platelets: 156 10*3/uL (ref 150–400)
RBC: 2.84 MIL/uL — ABNORMAL LOW (ref 4.22–5.81)
RDW: 17.5 % — ABNORMAL HIGH (ref 11.5–15.5)
WBC: 8.1 10*3/uL (ref 4.0–10.5)
nRBC: 0 % (ref 0.0–0.2)

## 2020-11-23 LAB — GLUCOSE, CAPILLARY: Glucose-Capillary: 107 mg/dL — ABNORMAL HIGH (ref 70–99)

## 2020-11-23 MED ORDER — POTASSIUM CHLORIDE ER 10 MEQ PO TBCR: 20.0000 meq | EXTENDED_RELEASE_TABLET | Freq: Every day | ORAL | 0 refills | Status: DC

## 2020-11-23 MED ORDER — ALBUTEROL SULFATE HFA 108 (90 BASE) MCG/ACT IN AERS: 2.0000 | INHALATION_SPRAY | Freq: Four times a day (QID) | RESPIRATORY_TRACT | Status: DC | PRN

## 2020-11-23 MED ORDER — ACETAMINOPHEN 325 MG PO TABS: 650.0000 mg | ORAL_TABLET | Freq: Four times a day (QID) | ORAL | Status: DC | PRN

## 2020-11-23 MED ORDER — CLONIDINE HCL 0.2 MG PO TABS: 0.2000 mg | ORAL_TABLET | Freq: Two times a day (BID) | ORAL | 1 refills | Status: DC

## 2020-11-23 MED ORDER — FLUTICASONE PROPIONATE 50 MCG/ACT NA SUSP: 1.0000 | Freq: Every day | NASAL | Status: DC | PRN

## 2020-11-23 MED ORDER — FE FUMARATE-B12-VIT C-FA-IFC PO CAPS: 1.0000 | ORAL_CAPSULE | Freq: Two times a day (BID) | ORAL | 1 refills | Status: DC

## 2020-11-23 MED ORDER — FUROSEMIDE 40 MG PO TABS
40.0000 mg | ORAL_TABLET | Freq: Every day | ORAL | 0 refills | Status: DC
Start: 2020-11-23 — End: 2021-01-09

## 2020-11-23 MED ORDER — AMOXICILLIN-POT CLAVULANATE 875-125 MG PO TABS: 1.0000 | ORAL_TABLET | Freq: Two times a day (BID) | ORAL | 0 refills | Status: DC

## 2020-11-23 NOTE — Progress Notes (Addendum)
[address]       ,[place] [address]             [phone]      4 Days Post-Op Procedure(s) (LRB): FLEXIBLE BRONCHOSCOPY (N/A) Subjective: conts to feel better  Objective: Vital signs in last 24 hours: Temp:  [97.5 F (36.4 C)-98.1 F (36.7 C)] 97.5 F (36.4 C) (02/20 0824) Pulse Rate:  [52-63] 61 (02/20 0824) Cardiac Rhythm: Sinus bradycardia;Heart block (02/20 0700) Resp:  [15-19] 17 (02/20 0824) BP: (93-132)/(50-62) 101/50 (02/20 0824) SpO2:  [95 %-99 %] 96 % (02/20 0824)  Hemodynamic parameters for last 24 hours:    Intake/Output from previous day: 02/19 0701 - 02/20 0700 In: -  Out: 1850 [Urine:1850] Intake/Output this shift: No intake/output data recorded.  General appearance: alert, cooperative and no distress Heart: regular rate and rhythm Lungs: slightly coarse and dim in left Abdomen: benign Extremities: edema + LE edema, venous stasis dermatitis noted and fairly stable Wound: incis healing well  Lab Results: Recent Labs    11/21/20 0034 11/23/20 0112  WBC 9.9 8.1  HGB 8.5* 8.5*  HCT 26.3* 25.3*  PLT 132* 156   BMET: No results for input(s): NA, K, CL, CO2, GLUCOSE, BUN, CREATININE, CALCIUM in the last 72 hours.  PT/INR: No results for input(s): LABPROT, INR in the last 72 hours. ABG    Component Value Date/Time   PHART 7.432 11/14/2020 1505   HCO3 25.8 11/14/2020 1505   TCO2 28 04/01/2020 1137   ACIDBASEDEF 1.0 04/01/2020 0946   O2SAT 95.4 11/14/2020 1505   CBG (last 3)  Recent Labs    11/22/20 1103 11/22/20 2145 11/23/20 0623  GLUCAP 78 108* 107*    Meds Scheduled Meds: . acetylcysteine  4 mL Nebulization TID  . amoxicillin-clavulanate  1 tablet Oral Q12H  . aspirin EC  81 mg Oral Daily  . atorvastatin  80 mg Oral Daily  . bisacodyl  10 mg Oral Daily  . carvedilol  6.25 mg Oral BID WC  . cloNIDine  0.2 mg Oral BID  . clopidogrel  75 mg Oral Daily  . enoxaparin (LOVENOX) injection  40 mg  Subcutaneous Q24H  . ezetimibe  10 mg Oral Daily  . ferrous [id number] C-folic acid  1 capsule Oral BID PC  . fluticasone furoate-vilanterol  1 puff Inhalation Daily  . gabapentin  200 mg Oral QHS  . guaiFENesin  1,200 mg Oral BID  . insulin aspart  0-15 Units Subcutaneous TID WC  . insulin aspart  0-5 Units Subcutaneous QHS  . insulin glargine  20 Units Subcutaneous QHS  . metFORMIN  1,000 mg Oral BID WC  . pantoprazole  40 mg Oral BID  . senna-docusate  1 tablet Oral QHS  . traZODone  100 mg Oral QHS   Continuous Infusions: . sodium chloride 10 mL/hr at 11/19/20 2200  . lactated ringers 10 mL/hr at 11/19/20 0928   PRN Meds:.fentaNYL (SUBLIMAZE) injection, ipratropium-albuterol, lactulose, ondansetron (ZOFRAN) IV, oxyCODONE, traMADol  Xrays DG Chest 2 View  Result Date: 11/22/2020 CLINICAL DATA:  Status post left lower lobectomy. EXAM: CHEST - 2 VIEW COMPARISON:  11/21/2020 FINDINGS: Stable cardiomediastinal contours. Status post left lower lobectomy with left lung volume loss. The left chest tube has been removed. There is a small pneumothorax identified within the left apex which measures approximately 1.1 cm in thickness. Left pleural effusion and left base atelectasis disease is unchanged. IMPRESSION: 1. Status post left lower lobectomy with small  left apical pneumothorax. Recommend attention on follow-up imaging. 2. Stable left pleural effusion and left base atelectasis/airspace disease. These results will be called to the ordering clinician or representative by the Radiologist Assistant, and communication documented in the PACS or [employer]. Electronically Signed   By: Kerby Moors M.D.   On: 11/22/2020 08:25   DG Chest 1V REPEAT Same Day  Result Date: 11/21/2020 CLINICAL DATA:  Status post left lung lobectomy. EXAM: CHEST - 1 VIEW SAME DAY COMPARISON:  Same day. FINDINGS: Stable cardiomegaly. Left-sided chest tube has been removed. No pneumothorax is noted. Right  lung is clear. Stable left basilar atelectasis is noted. Bony thorax is unremarkable. IMPRESSION: Status post removal of left-sided chest tube. No pneumothorax is noted. Electronically Signed   By: Marijo Conception M.D.   On: 11/21/2020 12:48   DG CHEST PORT 1 VIEW  Result Date: 11/23/2020 CLINICAL DATA:  Status post LEFT LOWER lobectomy. EXAM: PORTABLE CHEST 1 VIEW COMPARISON:  11/22/2020 and prior studies FINDINGS: LEFT lobectomy changes again noted. There is no evidence of pneumothorax. Pulmonary vascular congestion, LEFT LOWER lung atelectasis/opacities and small LEFT pleural effusion are again noted. Cardiomegaly and aortic valve replacement again noted. IMPRESSION: Stable chest radiograph with LEFT lobectomy changes, LEFT LOWER lung atelectasis/opacities and small LEFT pleural effusion. No evidence of pneumothorax. Electronically Signed   By: Margarette Canada M.D.   On: 11/23/2020 08:09   DG Abd 2 Views  Result Date: 11/22/2020 CLINICAL DATA:  Status post LEFT LOWER lobectomy for non-small cell carcinoma. EXAM: ABDOMEN - 2 VIEW COMPARISON:  11/21/2020 and prior studies FINDINGS: Mild gaseous distension of small bowel and colon noted. Gas and stool within the colon is identified. There is no evidence of pneumoperitoneum. LEFT LOWER lung airspace disease/atelectasis and LEFT pleural effusion noted. IMPRESSION: Mild gaseous distension of small bowel and colon which may represent ileus. Electronically Signed   By: Margarette Canada M.D.   On: 11/22/2020 08:20    Assessment/Plan: S/P Procedure(s) (LRB): FLEXIBLE BRONCHOSCOPY (N/A)  1 afeb, VSS 2 sats good on 2 liters 3 CXR very stable in appearance 4 no leukocytosis or left shift, H/H pretty stable ABLA 5 sugars well controlled 6 good UOP 7 home on O2, will give a short coarse of lasix for LE edema     LOS: 6 days    John Giovanni PA-C Pager [phone] 11/23/2020  Plan home on o2 , patient feels well and says ready to be home  I have seen and  examined Nathaniel Little and agree with the above assessment  and plan.  Grace Isaac MD Beeper [phone] Office [phone] 11/23/2020 9:28 AM

## 2020-11-23 NOTE — TOC Transition Note (Addendum)
Transition of Care [facility]) - CM/SW Discharge Note   Patient Details  Name: Keyshaun Exley MRN: [id number] Date of Birth: 01/23/1946  Transition of Care [facility]) CM/SW Contact:  Maebelle Munroe, RN Phone Number: 11/23/2020, 10:47 AM   Clinical Narrative: [facility] team for discharge planning. Attempted call to patient's room (no answer) and friend- Arlyn Dunning (no answer) to discuss discharge plan. Call to [place] to arrange home O2. O2 to be delivered to the room prior to discharge. Will continue to monitor for discharge planning. 1110- Received call from friend- Arlyn Dunning. He voices he is patient's neighbor. He is unable to pick patient up today. Nurse- Carmell Austria shares patient has arranged for his own discharge transport,   Final next level of care: Home/Self Care Barriers to Discharge: No Barriers Identified   Patient Goals and CMS Choice Patient states their goals for this hospitalization and ongoing recovery are:: Return to home.   Choice offered to / list presented to : NA  Discharge Placement                       Discharge Plan and Services                DME Arranged: Oxygen DME Agency: [employer] Date DME Agency Contacted: 11/23/20 Time DME Agency Contacted: 93 Representative spoke with at DME Agency: [place] Arranged: NA          Social Determinants of Health ([place]) Interventions     Readmission Risk Interventions No flowsheet data found.

## 2020-11-24 LAB — ANAEROBIC CULTURE W GRAM STAIN

## 2020-11-24 LAB — GLUCOSE, CAPILLARY: Glucose-Capillary: 92 mg/dL (ref 70–99)

## 2020-11-25 DIAGNOSIS — I5030 Unspecified diastolic (congestive) heart failure: Secondary | ICD-10-CM | POA: Diagnosis not present

## 2020-11-25 DIAGNOSIS — J449 Chronic obstructive pulmonary disease, unspecified: Secondary | ICD-10-CM | POA: Diagnosis not present

## 2020-11-28 ENCOUNTER — Other Ambulatory Visit: Payer: Self-pay

## 2020-11-28 ENCOUNTER — Ambulatory Visit (INDEPENDENT_AMBULATORY_CARE_PROVIDER_SITE_OTHER): Payer: Self-pay

## 2020-11-28 DIAGNOSIS — R6889 Other general symptoms and signs: Secondary | ICD-10-CM | POA: Diagnosis not present

## 2020-11-28 DIAGNOSIS — Z4802 Encounter for removal of sutures: Secondary | ICD-10-CM

## 2020-11-28 NOTE — Progress Notes (Signed)
Patient arrived for nurse visit to remove one suture post- procedure RATS LLLobectomy 11/17/20.  Sutures removed with no signs/ symptoms of infection noted from left side.  Patient tolerated procedure well.  Patient instructed to keep the incision sites clean and dry. Advised that he could shower, let soap and water run down incision sites and pat dry.  Patient also requesting simple face mask instead of nasal cannula due to discomfort.  Advised we would get in touch with [facility] about that. He also asked if he could get a refill on Potassium medication as it has helped with his leg cramps.  Advised that he would need to contact his PCP regarding medication.  Patient acknowledged instructions given. He is aware of his follow-up appointment with Dr. Roxan Hockey and chest xray.

## 2020-12-01 ENCOUNTER — Other Ambulatory Visit: Payer: Self-pay | Admitting: Thoracic Surgery (Cardiothoracic Vascular Surgery)

## 2020-12-01 DIAGNOSIS — R599 Enlarged lymph nodes, unspecified: Secondary | ICD-10-CM

## 2020-12-02 ENCOUNTER — Other Ambulatory Visit: Payer: Self-pay

## 2020-12-02 ENCOUNTER — Encounter: Payer: Self-pay | Admitting: Thoracic Surgery (Cardiothoracic Vascular Surgery)

## 2020-12-02 ENCOUNTER — Ambulatory Visit (INDEPENDENT_AMBULATORY_CARE_PROVIDER_SITE_OTHER): Payer: Self-pay | Admitting: Thoracic Surgery (Cardiothoracic Vascular Surgery)

## 2020-12-02 ENCOUNTER — Ambulatory Visit
Admission: RE | Admit: 2020-12-02 | Discharge: 2020-12-02 | Disposition: A | Discharge status: Home / Self Care | Payer: Medicare Other | Source: Ambulatory Visit | Attending: Thoracic Surgery (Cardiothoracic Vascular Surgery) | Admitting: Thoracic Surgery (Cardiothoracic Vascular Surgery)

## 2020-12-02 VITALS — BP 146/69 | HR 86 | Temp 97.9°F | Resp 20 | Ht 69.0 in | Wt 208.8 lb

## 2020-12-02 DIAGNOSIS — Z902 Acquired absence of lung [part of]: Secondary | ICD-10-CM

## 2020-12-02 DIAGNOSIS — Z9889 Other specified postprocedural states: Secondary | ICD-10-CM

## 2020-12-02 DIAGNOSIS — J90 Pleural effusion, not elsewhere classified: Secondary | ICD-10-CM | POA: Diagnosis not present

## 2020-12-02 DIAGNOSIS — R599 Enlarged lymph nodes, unspecified: Secondary | ICD-10-CM

## 2020-12-02 DIAGNOSIS — C3492 Malignant neoplasm of unspecified part of left bronchus or lung: Secondary | ICD-10-CM

## 2020-12-02 MED ORDER — TRAMADOL HCL 50 MG PO TABS: 50.0000 mg | ORAL_TABLET | Freq: Four times a day (QID) | ORAL | 0 refills | Status: AC | PRN

## 2020-12-02 NOTE — Progress Notes (Signed)
[address]       [place] [address]             [phone]     HPI: Mr. Deemer returns for scheduled postoperative follow-up visit after a left lower lobectomy.  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  He was found to have a lung nodule on a CT that was done preop for his TAVR in June 2021.  PET/CT showed mild uptake with an SUV of 2.  A follow-up CT in August showed no change, but by December 2021 it had increased in size to 1.5 cm.  I did a robotic left lower lobectomy and node dissection on 11/17/2020.  Postoperatively he developed atelectasis of the left upper lobe requiring bronchoscopy to clear mucous plugs.  Other than that his postoperative course was unremarkable and he went home on day 6.  He was sent home on oxygen.  He has been having a lot of pain.  He was not given a prescription for pain medication at discharge.[name]  He has been taking 1 baby aspirin a day and 100 mg of gabapentin daily.  He says he is managing pretty well but there are times when he needs something stronger for pain.  He is using his oxygen much of the time but often will take it off to get up and walk around.  Past Medical History:  Diagnosis Date  . (HFpEF) heart failure with preserved ejection fraction ([place])    a. 08/2017 Echo: EF 60-65%, Gr1 DD.  [name] Arthritis   . COPD (chronic obstructive pulmonary disease) ([place])   . Coronary artery disease 2021  . Depression   . Diabetes mellitus without complication ([place])   . Hypertension   . Incidental pulmonary nodule, greater than or equal to 64mm 03/11/2020   Left lower lobe - suspicious for bronchogenic neoplasm  . Lymphedema   . Morbid obesity ([place])   . Prostate cancer ([place]) 03/2013   ?  PANCREATIC  . S/P TAVR (transcatheter aortic valve replacement) 04/01/2020   s/p TAVR with a 29 mm Edwards Sapien 3 THV via the TF approach by Drs Buena Irish & Roxy Manns  . Severe aortic stenosis   . Sleep apnea with use of continuous positive airway pressure (CPAP)   . Tobacco abuse   . Tremor    HEAD    Current Outpatient Medications  Medication Sig Dispense Refill  . acetaminophen (TYLENOL) 325 MG tablet Take 2 tablets (650 mg total) by mouth every 6 (six) hours as needed.    [name] albuterol (PROVENTIL) (2.5 MG/3ML) 0.083% nebulizer solution Take 2.5 mg by nebulization every 6 (six) hours as needed for wheezing or shortness of breath.    [name] albuterol (VENTOLIN HFA) 108 (90 Base) MCG/ACT inhaler Inhale 2 puffs into the lungs every 6 (six) hours as needed for wheezing or shortness of breath.    [name] amoxicillin-clavulanate (AUGMENTIN) 875-125 MG tablet Take 1 tablet by mouth every 12 (twelve) hours. 20 tablet 0  . aspirin EC 81 MG tablet Take 81 mg by mouth daily.    [name] atorvastatin (LIPITOR) 80 MG tablet Take 80 mg by mouth daily.     . Budeson-Glycopyrrol-Formoterol (BREZTRI AEROSPHERE) 160-9-4.8 MCG/ACT AERO Inhale 2 puffs into the lungs 2 (two) times daily. 5.9  g 0  . carvedilol (COREG) 6.25 MG tablet Take 1 tablet (6.25 mg total) by mouth 2 (two) times daily with a meal. 180 tablet 3  . cloNIDine (CATAPRES) 0.2 MG tablet Take 1 tablet (0.2 mg total) by mouth 2 (two) times daily. 60 tablet 1  . clopidogrel (PLAVIX) 75 MG tablet TAKE 1 TABLET BY MOUTH ONCE DAILY (Patient taking differently: Take 75 mg by mouth daily.) 90 tablet 2  . diclofenac Sodium (VOLTAREN) 1 % GEL Apply 1 application topically 3 (three) times daily as needed (pain).    [name] ezetimibe (ZETIA) 10 MG tablet Take 1 tablet (10 mg total) by mouth daily. 90 tablet 3  . ferrous [id number] C-folic acid (TRINSICON / FOLTRIN) capsule Take 1 capsule by mouth 2 (two) times daily after a meal. 60 capsule 1  . fluticasone (FLONASE) 50  MCG/ACT nasal spray Place 1 spray into both nostrils daily as needed for allergies.    . furosemide (LASIX) 40 MG tablet Take 1 tablet (40 mg total) by mouth daily. 5 tablet 0  . gabapentin (NEURONTIN) 100 MG capsule Take 1-2 capsules (100-200 mg total) by mouth at bedtime. (Patient taking differently: Take 200 mg by mouth at bedtime.) 180 capsule 1  . insulin glargine (LANTUS) 100 unit/mL SOPN Inject 20 Units into the skin at bedtime.    [name] ipratropium-albuterol (DUONEB) 0.5-2.5 (3) MG/3ML SOLN Take 3 mLs by nebulization every 6 (six) hours as needed (Asthma).    . metFORMIN (GLUCOPHAGE) 500 MG tablet Take 1,000 mg by mouth 2 (two) times daily.     . pantoprazole (PROTONIX) 40 MG tablet Take 1 tablet (40 mg total) by mouth 2 (two) times daily. 60 tablet 11  . potassium chloride (KLOR-CON) 10 MEQ tablet Take 2 tablets (20 mEq total) by mouth daily. 5 tablet 0  . traMADol (ULTRAM) 50 MG tablet Take 1 tablet (50 mg total) by mouth every 6 (six) hours as needed for up to 7 days (postoperative pain). 28 tablet 0  . traZODone (DESYREL) 100 MG tablet Take 100 mg by mouth at bedtime.     No current facility-administered medications for this visit.    Physical Exam BP (!) 146/69 (BP Location: Left Arm, Patient Position: Sitting, Cuff Size: Normal)   Pulse 86   Temp 97.9 F (36.6 C) (Skin)   Resp 20   Ht 5\' 9"  (1.753 m)   Wt 208 lb 12.8 oz (94.7 kg)   SpO2 96% Comment: 2LNC  BMI 30.43 kg/m  75 year old man in no acute distress Alert and oriented x3 Cardiac regular rate and rhythm Lungs diminished at left base, otherwise clear Incisions healing well  Diagnostic Tests: CHEST - 2 VIEW  COMPARISON:  11/23/2020  FINDINGS: Heart size normal. TAVR unchanged in position. Pulmonary vascularity normal.  Mild to moderate left pleural effusion. Left lower lobe consolidation with mild interval improvement.  Right lung remains clear.  IMPRESSION: Left lower lobe consolidation with mild  improvement. Mild to moderate left effusion. Negative for heart failure.   Electronically Signed   By: Franchot Gallo M.D.   On: 12/02/2020 15:36 I personally reviewed the chest x-ray images.  Typical findings post left lower lobectomy.  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  Adenocarcinoma left lower lobe, stage Ia (pT1b, pN0)-he will  follow up with Dr. Janese Banks.  Should not need any adjuvant therapy.  Post left lower lobectomy-he is having some postoperative pain.  It is not clear to me why he was sent home without a prescription for pain medication.  He does have a an allergy to codeine, but has taken tramadol in the past without difficulty.  I gave him a prescription for tramadol for pain.  I told him that he could use Tylenol as needed and then use tramadol when his pain is more severe.  He did go home on oxygen.  Hopefully can be weaned off that over the next couple of weeks.  He should stay on the baby aspirin for his stents.  Continue Plavix as well.  Plan: We will have home health nurse wean O2 Tramadol 50 mg p.o. every 6 hours as needed, 28 tablets, no refills Follow-up with Dr. Janese Banks as scheduled Return in 3 weeks with PA lateral chest x-ray  Melrose Nakayama, MD [facility] [phone]

## 2020-12-05 ENCOUNTER — Telehealth: Payer: Self-pay

## 2020-12-05 NOTE — Telephone Encounter (Signed)
Lm to offer appt with Dr. Patsey Berthold on 01/06/2021.

## 2020-12-05 NOTE — Progress Notes (Signed)
Appreciate input.  Will get patient scheduled for follow-up in case cannot wean off of oxygen so that we can continue to follow on this issue.

## 2020-12-05 NOTE — Telephone Encounter (Signed)
-----   Message from Tyler Pita, MD sent at 12/05/2020  3:26 PM EST -----   ----- Message ----- From: Melrose Nakayama, MD Sent: 12/02/2020   4:08 PM EST To: Sindy Guadeloupe, MD, Tyler Pita, MD  I saw Mr. Preziosi back in the office today for follow-up after his recent robotic left lower lobectomy.  He turned out to have a primary adenocarcinoma of the lung.  He was stage Ia.  Overall he is doing well.  He is on a little oxygen.  We can try and wean that off.  I will see him back in about 3 weeks to check on his progress.  Thank you  Richardson Landry

## 2020-12-08 NOTE — Telephone Encounter (Signed)
Lm x2 for patient.   Letter has mailed to address on file.  Will close per office protocol.

## 2020-12-09 ENCOUNTER — Ambulatory Visit (INDEPENDENT_AMBULATORY_CARE_PROVIDER_SITE_OTHER): Payer: Medicare Other

## 2020-12-09 ENCOUNTER — Telehealth: Payer: Self-pay

## 2020-12-09 VITALS — Ht 69.0 in | Wt 208.0 lb

## 2020-12-09 DIAGNOSIS — Z0000 Encounter for general adult medical examination without abnormal findings: Secondary | ICD-10-CM | POA: Diagnosis not present

## 2020-12-09 NOTE — Patient Instructions (Signed)
Nathaniel Little , Thank you for taking time to come for your Medicare Wellness Visit. I appreciate your ongoing commitment to your health goals. Please review the following plan we discussed and let me know if I can assist you in the future.   Screening recommendations/referrals: Colonoscopy: cologuard 05/28/2018, due 05/28/2021 Recommended yearly ophthalmology/optometry visit for glaucoma screening and checkup Recommended yearly dental visit for hygiene and checkup  Vaccinations: Influenza vaccine: completed 08/04/2020, due 05/04/2021 Pneumococcal vaccine: due Tdap vaccine: due Shingles vaccine:  discussed   Covid-19:  08/26/2020, 12/20/2019, 11/22/2019, 10/29/2019  Advanced directives: copy in chart  Conditions/risks identified: none  Next appointment: Follow up in one year for your annual wellness visit.   Preventive Care 86 Years and Older, Male Preventive care refers to lifestyle choices and visits with your health care provider that can promote health and wellness. What does preventive care include?  A yearly physical exam. This is also called an annual well check.  Dental exams once or twice a year.  Routine eye exams. Ask your health care provider how often you should have your eyes checked.  Personal lifestyle choices, including:  Daily care of your teeth and gums.  Regular physical activity.  Eating a healthy diet.  Avoiding tobacco and drug use.  Limiting alcohol use.  Practicing safe sex.  Taking low doses of aspirin every day.  Taking vitamin and mineral supplements as recommended by your health care provider. What happens during an annual well check? The services and screenings done by your health care provider during your annual well check will depend on your age, overall health, lifestyle risk factors, and family history of disease. Counseling  Your health care provider may ask you questions about your:  Alcohol use.  Tobacco use.  Drug use.  Emotional  well-being.  Home and relationship well-being.  Sexual activity.  Eating habits.  History of falls.  Memory and ability to understand (cognition).  Work and work Statistician. Screening  You may have the following tests or measurements:  Height, weight, and BMI.  Blood pressure.  Lipid and cholesterol levels. These may be checked every 5 years, or more frequently if you are over 72 years old.  Skin check.  Lung cancer screening. You may have this screening every year starting at age 47 if you have a 30-pack-year history of smoking and currently smoke or have quit within the past 15 years.  Fecal occult blood test (FOBT) of the stool. You may have this test every year starting at age 66.  Flexible sigmoidoscopy or colonoscopy. You may have a sigmoidoscopy every 5 years or a colonoscopy every 10 years starting at age 53.  Prostate cancer screening. Recommendations will vary depending on your family history and other risks.  Hepatitis C blood test.  Hepatitis B blood test.  Sexually transmitted disease (STD) testing.  Diabetes screening. This is done by checking your blood sugar (glucose) after you have not eaten for a while (fasting). You may have this done every 1-3 years.  Abdominal aortic aneurysm (AAA) screening. You may need this if you are a current or former smoker.  Osteoporosis. You may be screened starting at age 31 if you are at high risk. Talk with your health care provider about your test results, treatment options, and if necessary, the need for more tests. Vaccines  Your health care provider may recommend certain vaccines, such as:  Influenza vaccine. This is recommended every year.  Tetanus, diphtheria, and acellular pertussis (Tdap, Td) vaccine. You may  need a Td booster every 10 years.  Zoster vaccine. You may need this after age 16.  Pneumococcal 13-valent conjugate (PCV13) vaccine. One dose is recommended after age 29.  Pneumococcal  polysaccharide (PPSV23) vaccine. One dose is recommended after age 15. Talk to your health care provider about which screenings and vaccines you need and how often you need them. This information is not intended to replace advice given to you by your health care provider. Make sure you discuss any questions you have with your health care provider. Document Released: 10/17/2015 Document Revised: 06/09/2016 Document Reviewed: 07/22/2015 Elsevier Interactive Patient Education  2017 [place] Prevention in the Home Falls can cause injuries. They can happen to people of all ages. There are many things you can do to make your home safe and to help prevent falls. What can I do on the outside of my home?  Regularly fix the edges of walkways and driveways and fix any cracks.  Remove anything that might make you trip as you walk through a door, such as a raised step or threshold.  Trim any bushes or trees on the path to your home.  Use bright outdoor lighting.  Clear any walking paths of anything that might make someone trip, such as rocks or tools.  Regularly check to see if handrails are loose or broken. Make sure that both sides of any steps have handrails.  Any raised decks and porches should have guardrails on the edges.  Have any leaves, snow, or ice cleared regularly.  Use sand or salt on walking paths during winter.  Clean up any spills in your garage right away. This includes oil or grease spills. What can I do in the bathroom?  Use night lights.  Install grab bars by the toilet and in the tub and shower. Do not use towel bars as grab bars.  Use non-skid mats or decals in the tub or shower.  If you need to sit down in the shower, use a plastic, non-slip stool.  Keep the floor dry. Clean up any water that spills on the floor as soon as it happens.  Remove soap buildup in the tub or shower regularly.  Attach bath mats securely with double-sided non-slip rug  tape.  Do not have throw rugs and other things on the floor that can make you trip. What can I do in the bedroom?  Use night lights.  Make sure that you have a light by your bed that is easy to reach.  Do not use any sheets or blankets that are too big for your bed. They should not hang down onto the floor.  Have a firm chair that has side arms. You can use this for support while you get dressed.  Do not have throw rugs and other things on the floor that can make you trip. What can I do in the kitchen?  Clean up any spills right away.  Avoid walking on wet floors.  Keep items that you use a lot in easy-to-reach places.  If you need to reach something above you, use a strong step stool that has a grab bar.  Keep electrical cords out of the way.  Do not use floor polish or wax that makes floors slippery. If you must use wax, use non-skid floor wax.  Do not have throw rugs and other things on the floor that can make you trip. What can I do with my stairs?  Do not leave any items on  the stairs.  Make sure that there are handrails on both sides of the stairs and use them. Fix handrails that are broken or loose. Make sure that handrails are as long as the stairways.  Check any carpeting to make sure that it is firmly attached to the stairs. Fix any carpet that is loose or worn.  Avoid having throw rugs at the top or bottom of the stairs. If you do have throw rugs, attach them to the floor with carpet tape.  Make sure that you have a light switch at the top of the stairs and the bottom of the stairs. If you do not have them, ask someone to add them for you. What else can I do to help prevent falls?  Wear shoes that:  Do not have high heels.  Have rubber bottoms.  Are comfortable and fit you well.  Are closed at the toe. Do not wear sandals.  If you use a stepladder:  Make sure that it is fully opened. Do not climb a closed stepladder.  Make sure that both sides of the  stepladder are locked into place.  Ask someone to hold it for you, if possible.  Clearly mark and make sure that you can see:  Any grab bars or handrails.  First and last steps.  Where the edge of each step is.  Use tools that help you move around (mobility aids) if they are needed. These include:  Canes.  Walkers.  Scooters.  Crutches.  Turn on the lights when you go into a dark area. Replace any light bulbs as soon as they burn out.  Set up your furniture so you have a clear path. Avoid moving your furniture around.  If any of your floors are uneven, fix them.  If there are any pets around you, be aware of where they are.  Review your medicines with your doctor. Some medicines can make you feel dizzy. This can increase your chance of falling. Ask your doctor what other things that you can do to help prevent falls. This information is not intended to replace advice given to you by your health care provider. Make sure you discuss any questions you have with your health care provider. Document Released: 07/17/2009 Document Revised: 02/26/2016 Document Reviewed: 10/25/2014 Elsevier Interactive Patient Education  2017 [employer].

## 2020-12-09 NOTE — Telephone Encounter (Signed)
-----   Message from Melrose Nakayama, MD sent at 12/09/2020 10:03 AM EST ----- Regarding: RE: Weaning of O2 That is fine  [facility] ----- Message ----- From: Donnella Sham, RN Sent: 12/09/2020   9:40 AM EST To: Melrose Nakayama, MD Subject: Weaning of O2                                  Hey,  I have sent referrals to several different home health agencies about coming into the home to wean his oxygen.  All companies have declined, please advise. Could check sats while ambulating in the office at his next appointment?   Thanks,  Caryl Pina

## 2020-12-09 NOTE — Telephone Encounter (Signed)
Patient made aware.

## 2020-12-09 NOTE — Progress Notes (Signed)
I connected with Nathaniel Little today by telephone and verified that I am speaking with the correct person using two identifiers. Location patient: home Location provider: work Persons participating in the virtual visit: Yonathan, [place] LPN.   I discussed the limitations, risks, security and privacy concerns of performing an evaluation and management service by telephone and the availability of in person appointments. I also discussed with the patient that there may be a patient responsible charge related to this service. The patient expressed understanding and verbally consented to this telephonic visit.    Interactive audio and video telecommunications were attempted between this provider and patient, however failed, due to patient having technical difficulties OR patient did not have access to video capability.  We continued and completed visit with audio only.     Vital signs may be patient reported or missing.  Subjective:   Nathaniel Little is a 75 y.o. male who presents for Medicare Annual/Subsequent preventive examination.  Review of Systems     Cardiac Risk Factors include: advanced age (>56mn, >>20women);diabetes mellitus;dyslipidemia;hypertension;male gender     Objective:    Today's Vitals   12/09/20 0952  Weight: 208 lb (94.3 kg)  Height: _0  (1.753 m)  PainSc: 3    Body mass index is 30.72 kg/m.  Advanced Directives 12/09/2020 11/19/2020 11/17/2020 11/14/2020 09/19/2020 06/19/2020 05/25/2020  Does Patient Have a Medical Advance Directive? Yes Yes Yes Yes No Yes No  Type of AParamedicof ACalifornia Polytechnic State UniversityLiving will HHumphreyLiving will HPennsbury VillageLiving will [place]tonLiving will - HEd[place]burgLiving will -  Does patient want to make changes to medical advance directive? - - No - Patient declined - - - -  Copy of [place]in Chart? Yes - validated most  recent copy scanned in chart (See row information) No - copy requested No - copy requested No - copy requested - - -  Would patient like information on creating a medical advance directive? - - - - No - Patient declined - No - Patient declined    Current Medications (verified) Outpatient Encounter Medications as of 12/09/2020  Medication Sig  . albuterol (PROVENTIL) (2.5 MG/3ML) 0.083% nebulizer solution Take 2.5 mg by nebulization every 6 (six) hours as needed for wheezing or shortness of breath.  .Marland Kitchenalbuterol (VENTOLIN HFA) 108 (90 Base) MCG/ACT inhaler Inhale 2 puffs into the lungs every 6 (six) hours as needed for wheezing or shortness of breath.  .Marland Kitchenamoxicillin-clavulanate (AUGMENTIN) 875-125 MG tablet Take 1 tablet by mouth every 12 (twelve) hours.  .Marland Kitchenaspirin EC 81 MG tablet Take 81 mg by mouth daily.  .Marland Kitchenatorvastatin (LIPITOR) 80 MG tablet Take 80 mg by mouth daily.   . Budeson-Glycopyrrol-Formoterol (BREZTRI AEROSPHERE) 160-9-4.8 MCG/ACT AERO Inhale 2 puffs into the lungs 2 (two) times daily.  . carvedilol (COREG) 6.25 MG tablet Take 1 tablet (6.25 mg total) by mouth 2 (two) times daily with a meal.  . cloNIDine (CATAPRES) 0.2 MG tablet Take 1 tablet (0.2 mg total) by mouth 2 (two) times daily.  . clopidogrel (PLAVIX) 75 MG tablet TAKE 1 TABLET BY MOUTH ONCE DAILY (Patient taking differently: Take 75 mg by mouth daily.)  . diclofenac Sodium (VOLTAREN) 1 % GEL Apply 1 application topically 3 (three) times daily as needed (pain).  .Marland Kitchenezetimibe (ZETIA) 10 MG tablet Take 1 tablet (10 mg total) by mouth daily.  . ferrous [id number]-folic acid (TRINSICON / FOLTRIN) capsule  Take 1 capsule by mouth 2 (two) times daily after a meal.  . fluticasone (FLONASE) 50 MCG/ACT nasal spray Place 1 spray into both nostrils daily as needed for allergies.  . furosemide (LASIX) 40 MG tablet Take 1 tablet (40 mg total) by mouth daily.  [name] gabapentin (NEURONTIN) 100 MG capsule Take 1-2 capsules (100-200 mg  total) by mouth at bedtime. (Patient taking differently: Take 200 mg by mouth at bedtime.)  . insulin glargine (LANTUS) 100 unit/mL SOPN Inject 20 Units into the skin at bedtime.  [name] ipratropium-albuterol (DUONEB) 0.5-2.5 (3) MG/3ML SOLN Take 3 mLs by nebulization every 6 (six) hours as needed (Asthma).  . metFORMIN (GLUCOPHAGE) 500 MG tablet Take 1,000 mg by mouth 2 (two) times daily.   . potassium chloride (KLOR-CON) 10 MEQ tablet Take 2 tablets (20 mEq total) by mouth daily.  . traMADol (ULTRAM) 50 MG tablet Take 1 tablet (50 mg total) by mouth every 6 (six) hours as needed for up to 7 days (postoperative pain).  . traZODone (DESYREL) 100 MG tablet Take 100 mg by mouth at bedtime.  [name] acetaminophen (TYLENOL) 325 MG tablet Take 2 tablets (650 mg total) by mouth every 6 (six) hours as needed. (Patient not taking: Reported on 12/09/2020)  . pantoprazole (PROTONIX) 40 MG tablet Take 1 tablet (40 mg total) by mouth 2 (two) times daily.   No facility-administered encounter medications on file as of 12/09/2020.    Allergies (verified) Codeine   History: Past Medical History:  Diagnosis Date  . (HFpEF) heart failure with preserved ejection fraction ([place])    a. 08/2017 Echo: EF 60-65%, Gr1 DD.  [name] Arthritis   . COPD (chronic obstructive pulmonary disease) ([place])   . Coronary artery disease 2021  . Depression   . Diabetes mellitus without complication ([place])   . Hypertension   . Incidental pulmonary nodule, greater than or equal to 37m 03/11/2020   Left lower lobe - suspicious for bronchogenic neoplasm  . Lymphedema   . Morbid obesity ([place]   . Prostate cancer ([place] 03/2013   ? PANCREATIC  . 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 Severe aortic stenosis   . Sleep apnea with use of continuous positive airway pressure (CPAP)   . Tobacco abuse   . Tremor    HEAD   Past Surgical  History:  Procedure Laterality Date  . ADENOIDECTOMY    . APPENDECTOMY    . CARDIAC CATHETERIZATION    . CATARACT EXTRACTION W/PHACO Left 11/28/2018   Procedure: CATARACT EXTRACTION PHACO AND INTRAOCULAR LENS PLACEMENT (IOC) LEFT, DIABETIC;  Surgeon: PBirder Robson MD;  Location: [facility] ORS;  Service: Ophthalmology;  Laterality: Left;  UKorea 00:56 CDE 9.44 Fluid pack lot # [id number]  . EUS N/A 10/13/2017   Procedure: FULL UPPER ENDOSCOPIC ULTRASOUND (EUS) RADIAL;  Surgeon: [name];  [facility];  Service: Gastroenterology;  Laterality: N/A;  . FLEXIBLE BRONCHOSCOPY N/A 11/19/2020   Procedure: FLEXIBLE BRONCHOSCOPY;  Surgeon: HMelrose Nakayama MD;  [place]  Service: Thoracic;  Laterality: N/A;  . INTERCOSTAL NERVE BLOCK Left 11/17/2020   Procedure: INTERCOSTAL NERVE BLOCK;  Surgeon: HMelrose Nakayama MD;  Location: [place]  Service: Thoracic;  Laterality: Left;  . NODE DISSECTION Left 11/17/2020   Procedure: NODE DISSECTION;  Surgeon: HMelrose Nakayama MD;  Location: [facility]  Service: Thoracic;  Laterality: Left;  . PANCREATICODUODENECTOMY  07/23/2018  . RIGHT/LEFT HEART CATH AND CORONARY ANGIOGRAPHY N/A 02/21/2020   Procedure: RIGHT/LEFT HEART CATH AND CORONARY ANGIOGRAPHY;  Surgeon: Minna Merritts, MD;  Location: [place] [facility];  Service: Cardiovascular;  Laterality: N/A;  . TEE WITHOUT CARDIOVERSION N/A 04/01/2020   Procedure: TRANSESOPHAGEAL ECHOCARDIOGRAM (TEE);  Surgeon: Burnell Blanks, MD;  Location: [place] [facility];  Service: Open Heart Surgery;  Laterality: N/A;  . TONSILLECTOMY    . TRANSCATHETER AORTIC VALVE REPLACEMENT, TRANSFEMORAL N/A 04/01/2020   Procedure: TRANSCATHETER AORTIC VALVE REPLACEMENT, TRANSFEMORAL;  Surgeon: Burnell Blanks, MD;  Location: [place] [facility];  Service: Open Heart Surgery;  Laterality: N/A;  . wipple     Family History  Problem Relation Age of Onset  . Breast cancer Mother    . Cancer Father        Black Lung  . Cerebral palsy Brother   . Tuberculosis Paternal Uncle   . Prostate cancer Neg Hx   . Kidney cancer Neg Hx   . Bladder Cancer Neg Hx    Social History   Socioeconomic History  . Marital status: Widowed    Spouse name: Not on file  . Number of children: 2  . Years of education: Not on file  . Highest education level: Not on file  Occupational History  . Occupation: retired Social research officer, government  Tobacco Use  . Smoking status: Former Smoker    Packs/day: 4.00    Years: 68.00    Pack years: 272.00    Types: Cigarettes  . Smokeless tobacco: Former Systems developer    Types: Chew, [place] date: 2000  . Tobacco comment: quit smoking 11/17/2020  Vaping Use  . Vaping Use: Never used  Substance and Sexual Activity  . Alcohol use: Not Currently    Alcohol/week: 0.0 - 1.0 standard drinks    Comment: quit 40years ago  . Drug use: No  . Sexual activity: Not Currently  Other Topics Concern  . Not on file  Social History Narrative   Living at [place] Strain: Low Risk   . Difficulty of Paying Living Expenses: Not hard at all  Food Insecurity: No Food Insecurity  . Worried About Charity fundraiser in the Last Year: Never true  . Ran Out of Food in the Last Year: Never true  Transportation Needs: No Transportation Needs  . Lack of Transportation (Medical): No  . Lack of Transportation (Non-Medical): No  Physical Activity: Inactive  . Days of Exercise per Week: 0 days  . Minutes of Exercise per Session: 0 min  Stress: No Stress Concern Present  . Feeling of Stress : Not at all  Social Connections: Moderately Isolated  . Frequency of Communication with Friends and Family: More than three times a week  . Frequency of Social Gatherings with Friends and Family: More than three times a week  . Attends Religious Services: More than 4 times per year  . Active Member of Clubs or  Organizations: No  . Attends Archivist Meetings: Never  . Marital Status: Widowed    Tobacco Counseling Counseling given: Not Answered Comment: quit smoking 11/17/2020   Clinical Intake:  Pre-visit preparation completed: Yes  Pain : 0-10 Pain Score: 3  Pain Type: Chronic pain Pain Orientation: Left Pain Descriptors / Indicators: Aching Pain Onset: More than a month ago Pain Frequency: Constant  Nutritional Status: BMI > 30  Obese Nutritional Risks: None Diabetes: Yes  How often do you need to have someone help you when you read instructions, pamphlets, or other written materials from your doctor or pharmacy?: 1 - Never What is the last grade level you completed in school?: 12th grade  Diabetic? Yes Nutrition Risk Assessment:  Has the patient had any N/V/D within the last 2 months?  No  Does the patient have any non-healing wounds?  No  Has the patient had any unintentional weight loss or weight gain?  No   Diabetes:  Is the patient diabetic?  Yes  If diabetic, was a CBG obtained today?  No  Did the patient bring in their glucometer from home?  No  How often do you monitor your CBG's? Twice daily.   Financial Strains and Diabetes Management:  Are you having any financial strains with the device, your supplies or your medication? No .  Does the patient want to be seen by Chronic Care Management for management of their diabetes?  No  Would the patient like to be referred to a Nutritionist or for Diabetic Management?  No   Diabetic Exams:  Diabetic Eye Exam: Overdue for diabetic eye exam. Pt has been advised about the importance in completing this exam. Patient advised to call and schedule an eye exam. Diabetic Foot Exam: Completed 06/12/2020      Information entered by :: NAllen LPN   Activities of Daily Living In your present state of health, do you have any difficulty performing the following activities: 12/09/2020 11/17/2020  Hearing? N Y   Vision? N N  Difficulty concentrating or making decisions? N N  Walking or climbing stairs? N Y  Dressing or bathing? N N  Doing errands, shopping? N N  Preparing Food and eating ? N -  Using the Toilet? N -  In the past six months, have you accidently leaked urine? N -  Do you have problems with loss of bowel control? N -  Managing your Medications? N -  Managing your Finances? N -  Housekeeping or managing your Housekeeping? N -  Some recent data might be hidden    Patient Care Team: [name], [name], FNP as PCP - General (Family Medicine) Minna Merritts, MD as PCP - Cardiology (Cardiology) Minna Merritts, MD as Consulting Physician (Cardiology) Vanita Ingles, RN as Case Manager ([place]) Bassett, Virl Diamond, [facility] as Pharmacist  Indicate any recent Medical Services you may have received from other than [facility] providers in the past year (date may be approximate).     Assessment:   This is a routine wellness examination for Marcelus.  Hearing/Vision screen  Hearing Screening   _0  _1  _2  _3  _4  _5  _6  _7  _8   Right ear:           Left ear:           Vision Screening Comments: No regular eye exams  Dietary issues and exercise activities discussed: Current Exercise Habits: The patient does not participate in regular exercise at present  Goals    .  Patient Stated      12/09/2020, get well    .  PharmD - Medication Review      CARE PLAN ENTRY (see longitudinal plan of care for additional care plan information)   Current Barriers:  . Chronic Disease Management support, education, and care coordination needs related to HTN, OSA on CPAP, COPD, GERD, aortic atherosclerosis, tobacco use, arthritis  Pharmacist Clinical Goal(s):  [name] Over the next 30 days, patient will work with CM Pharmacist to address needs related to medication management  Interventions: . Inter-disciplinary care team collaboration (see longitudinal plan of  care) . Perform chart review o Patient [facility] - 6/30 for TAVR (transcatheter aortic valve replacement) o Note patient missed post-TAVR follow up appointment with Cardiology on 7/7 due to lack of transportation. o Patient left message by LCSW with [facility] on 7/8 for assistance with transportation. Myles Rosenthal on importance of medication adherence - Reports he has NOT resumed medications as ordered at discharge - ONLY taking metformin and using inhalers o Counsel patient to resume all medications as ordered at discharge.  o Patient is able to locate discharge instructions. Confirms having all medications on discharge medication list. States that he will fill oral medications accordingly into weekly pillbox and start taking these, as well as resume his Lantus, today . Counsel on importance of follow up with providers for new medical questions/concerns, particularly for any of the symptoms included on his discharge paperwork. o Reports feels like he has been having hot flashes in the evenings, starting ~2 days after discharged from the hospital. Reports has checked his temperature - denies having had a fever, temperature running ~ 98.6. Denies other symptoms o Reports will call PCP office today to ask about the hot flashes . Counsel patient to follow up with [facility] LCSW regarding transportation to future appointment . Counsel patient to restart home blood pressure monitoring o Denies having checked BP recently, but states will resume . Counsel patient to continue home blood sugar monitoring o Reports morning fasting blood sugar today: 177 . Will mail patient new BP log as requested. Roney Marion with PCP . Collaborate with Pam, RN with [facility] office regarding medications patient has missed since hospital discharge (including aspirin, clopidogrel and BP medications) and report of evening hot flashes. Pam advises that she has notified Dr. Rockey Situ and provider  also recommends patient resume medications as ordered at discharge. Pam recommends also notifying Dr. Angelena Form in the [facility] office. . Coordination of care calls to [facility]. Leave messages with Altha Harm and Sunday Spillers. Altha Harm advises that Dr. Angelena Form is not in the office today but will request clinical team return my call.  Patient Self Care Activities:  . Self administers medications as prescribed o Uses twice daily weekly pillbox  . Attends all scheduled provider appointments o Next appointment with Cardiology on 7/29 . Calls pharmacy for medication refills . Calls provider office for new concerns or questions  Please see past updates related to this goal by clicking on the "Past Updates" button in the selected goal      .  Quit Smoking      Smoking cessation discussed    .  SW: I need some extra support right now (pt-stated)      CARE PLAN ENTRY (see longtitudinal plan of care for additional care plan information)  Current Barriers:  . Financial constraints related to managing health care expenses and affording needed DME . Limited social support . ADL IADL limitations . Social Isolation . Limited access to caregiver . Inability to perform ADL's independently . Inability to perform IADL's independently  Clinical Social Work Clinical Goal(s):  [name] Over the next 120 days, patient will work with SW to address concerns related to gaining additional support/resource connection in order to maintain health . Over the next 120 days, patient will demonstrate improved adherence  to self care as evidenced by implementing healthy self-care into his daily routine such as: attending all medical appointments, taking time for self-reflection, taking medications as prescribed, drinking water and daily exercise to improve mobility.  Interventions: . Patient interviewed and appropriate assessments performed. Referral received from PCP-He has COPD and is well  controlled without exacerbation but recently went back to smoking and now has daily coughing with increased mucus production nightly around 6pm.  I am hoping to have help with reaching out to these specialists offices and making sure he can get to them.  He has a rolling walker and utilizes mass transit for transportation.  . Provided patient with information about available transportation benefits within his insurance. Patient use [employer] in order to get to his medical appointments. However, they will not drive over 25 mins from his residence. Patient reports that he will have a follow up cancer appointment in [place] at [place] next year and will need transportation assistance for this arrangement. LCSW provided education on [facility] transportation and patient confirms that he has used this service in the past and will do so again when needing transportation arrangements out of [employer]. . Discussed plans with patient for ongoing care management follow up and provided patient with direct contact information for care management team . Education provided in regards improving self-care. Patient admits that he has started back smoking cigarettes after 4 months of discontinuing them. Patient reports smoking 3 cigarettes per day still. Advised patient to implement appropriate coping skills and self-care tools into his daily routine to improve overall health care.  . Patient reports that his mouth is very dry from using his CPAP machine at night. He reports that this has caused him some complications and irritation of the skin.  . Assisted patient/caregiver with obtaining information about health plan benefits . Patient shares that he was put on new medication injection that he uses every night to help keep his blood sugars low. He reports that he has only been on this medication for a week and has already seen positive outcomes. . Provided education and assistance to client regarding  Advanced Directives. . Patient reports that he is sleeping a lot more but does not contribute this to any mental health concern but rather his health and fatigue. Emotional support and active reflective listening provided. Patient denied needing mental health resource education at this time.  . Patient confirms stable transportation through [facility] to all upcoming medical appointments.  . Patient reports that he is really trying to implement healthy eating/living into his daily routine to help his cholesterol. Positive reinforcement provided for this achievement. . Patient denies being in any pain or discomfort at this time but reports ongoing issues with his sleep.  . Patient has questions about several "black spots" that have appeared on his top left leg. Patient reports that these look like "dark lines on my skin." Patient denies any pain or itching from these but is wondering if he should be concerned. LSW will send in basket message to CCM RNCM to make sure she is aware.   Patient Self Care Activities:  . Self administers medications as prescribed . Attends all scheduled provider appointments . Calls provider office for new concerns or questions . Lacks social connections  Please see past updates related to this goal by clicking on the "Past Updates" button in the selected goal        Depression Screen [facility] 2/9 Scores 12/09/2020 03/31/2020 12/04/2019 08/13/2019  06/05/2019 03/05/2019 10/11/2018  PHQ - 2 Score 0 0 1 1 0 2 1  PHQ- 9 Score - - - _0 -    Fall Risk Fall Risk  12/09/2020 12/04/2019 08/13/2019 03/05/2019 01/25/2019  Falls in the past year? 0 0 0 0 0  Comment - - - - -  Number falls in past yr: - 0 - - -  Injury with Fall? - 0 - - -  Risk for fall due to : Impaired balance/gait;Medication side effect;Impaired mobility - - - -  Follow up Falls evaluation completed;Education provided;Falls prevention discussed - - Falls evaluation completed -    FALL RISK PREVENTION  PERTAINING TO THE HOME:  Any stairs in or around the home? No  If so, are there any without handrails? n/a Home free of loose throw rugs in walkways, pet beds, electrical cords, etc? No  Adequate lighting in your home to reduce risk of falls? No   ASSISTIVE DEVICES UTILIZED TO PREVENT FALLS:  Life alert? Yes  Use of a cane, walker or w/c? Yes  Grab bars in the bathroom? Yes  Shower chair or bench in shower? Yes  Elevated toilet seat or a handicapped toilet? Yes   TIMED UP AND GO:  Was the test performed? No .   Cognitive Function:     6CIT Screen 12/09/2020 02/28/2018  What Year? 0 points 0 points  What month? 0 points 0 points  What time? 0 points 0 points  Count back from 20 0 points 0 points  Months in reverse 4 points 0 points  Repeat phrase 2 points 0 points  Total Score 6 0    Immunizations Immunization History  Administered Date(s) Administered  . Influenza, High Dose Seasonal PF 08/09/2018  . Influenza,inj,Quad PF,6+ Mos 07/15/2019  . Influenza-Unspecified 07/12/2019, 08/04/2020  . Moderna Sars-Covid-2 Vaccination 11/22/2019, 12/20/2019, 08/26/2020  . PFIZER(Purple Top)SARS-COV-2 Vaccination 10/29/2019  . Pneumococcal Conjugate-13 12/30/2017    TDAP status: Due, Education has been provided regarding the importance of this vaccine. Advised may receive this vaccine at local pharmacy or Health Dept. Aware to provide a copy of the vaccination record if obtained from local pharmacy or Health Dept. Verbalized acceptance and understanding.  Flu Vaccine status: Up to date  Pneumococcal vaccine status: Due, Education has been provided regarding the importance of this vaccine. Advised may receive this vaccine at local pharmacy or Health Dept. Aware to provide a copy of the vaccination record if obtained from local pharmacy or Health Dept. Verbalized acceptance and understanding.  Covid-19 vaccine status: Completed vaccines  Qualifies for Shingles Vaccine? Yes   Zostavax  completed Yes   Shingrix Completed?: No.    Education has been provided regarding the importance of this vaccine. Patient has been advised to call insurance company to determine out of pocket expense if they have not yet received this vaccine. Advised may also receive vaccine at local pharmacy or Health Dept. Verbalized acceptance and understanding.  Screening Tests Health Maintenance  Topic Date Due  . TETANUS/TDAP  Never done  . PNA vac Low Risk Adult (2 of 2 - PPSV23) 12/31/2018  . OPHTHALMOLOGY EXAM  06/13/2019  . HEMOGLOBIN A1C  05/14/2021  . Fecal DNA (Cologuard)  05/28/2021  . FOOT EXAM  06/12/2021  . INFLUENZA VACCINE  Completed  . COVID-19 Vaccine  Completed  . Hepatitis C Screening  Completed  . HPV VACCINES  Aged Out    Health Maintenance  Health Maintenance Due  Topic Date Due  .  TETANUS/TDAP  Never done  . PNA vac Low Risk Adult (2 of 2 - PPSV23) 12/31/2018  . OPHTHALMOLOGY EXAM  06/13/2019    Colorectal cancer screening: Type of screening: Cologuard. Completed 05/28/2018. Repeat every 3 years  Lung Cancer Screening: (Low Dose CT Chest recommended if Age 71-80 years, 30 pack-year currently smoking OR have quit w/in 15years.) does qualify.   Lung Cancer Screening Referral: has lung cancer  Additional Screening:  Hepatitis C Screening: does qualify; Completed 05/01/2018  Vision Screening: Recommended annual ophthalmology exams for early detection of glaucoma and other disorders of the eye. Is the patient up to date with their annual eye exam?  No  Who is the provider or what is the name of the office in which the patient attends annual eye exams?  If pt is not established with a provider, would they like to be referred to a provider to establish care? No .   Dental Screening: Recommended annual dental exams for proper oral hygiene  Community Resource Referral / Chronic Care Management: CRR required this visit?  No   CCM required this visit?  No      Plan:      I have personally reviewed and noted the following in the patient's chart:   . Medical and social history . Use of alcohol, tobacco or illicit drugs  . Current medications and supplements . Functional ability and status . Nutritional status . Physical activity . Advanced directives . List of other physicians . Hospitalizations, surgeries, and ER visits in previous 12 months . Vitals . Screenings to include cognitive, depression, and falls . Referrals and appointments  In addition, I have reviewed and discussed with patient certain preventive protocols, quality metrics, and best practice recommendations. A written personalized care plan for preventive services as well as general preventive health recommendations were provided to patient.     Kellie Simmering, LPN   10/09/1094   Nurse Notes:

## 2020-12-11 ENCOUNTER — Inpatient Hospital Stay: Payer: Medicare Other | Admitting: Unknown Physician Specialty

## 2020-12-12 ENCOUNTER — Encounter: Payer: Self-pay | Admitting: Oncology

## 2020-12-12 ENCOUNTER — Inpatient Hospital Stay: Payer: Medicare Other | Attending: Oncology | Admitting: Oncology

## 2020-12-12 VITALS — BP 125/69 | HR 72 | Temp 96.8°F | Wt 203.4 lb

## 2020-12-12 DIAGNOSIS — Z7982 Long term (current) use of aspirin: Secondary | ICD-10-CM | POA: Diagnosis not present

## 2020-12-12 DIAGNOSIS — Z803 Family history of malignant neoplasm of breast: Secondary | ICD-10-CM | POA: Diagnosis not present

## 2020-12-12 DIAGNOSIS — Z87891 Personal history of nicotine dependence: Secondary | ICD-10-CM | POA: Diagnosis not present

## 2020-12-12 DIAGNOSIS — Z902 Acquired absence of lung [part of]: Secondary | ICD-10-CM | POA: Diagnosis not present

## 2020-12-12 DIAGNOSIS — C3432 Malignant neoplasm of lower lobe, left bronchus or lung: Secondary | ICD-10-CM | POA: Diagnosis not present

## 2020-12-12 DIAGNOSIS — Z7189 Other specified counseling: Secondary | ICD-10-CM | POA: Diagnosis not present

## 2020-12-12 DIAGNOSIS — J449 Chronic obstructive pulmonary disease, unspecified: Secondary | ICD-10-CM | POA: Insufficient documentation

## 2020-12-12 DIAGNOSIS — R911 Solitary pulmonary nodule: Secondary | ICD-10-CM

## 2020-12-12 DIAGNOSIS — Z794 Long term (current) use of insulin: Secondary | ICD-10-CM | POA: Insufficient documentation

## 2020-12-12 DIAGNOSIS — Z79899 Other long term (current) drug therapy: Secondary | ICD-10-CM | POA: Insufficient documentation

## 2020-12-12 DIAGNOSIS — E119 Type 2 diabetes mellitus without complications: Secondary | ICD-10-CM | POA: Diagnosis not present

## 2020-12-12 DIAGNOSIS — I5032 Chronic diastolic (congestive) heart failure: Secondary | ICD-10-CM | POA: Diagnosis not present

## 2020-12-12 DIAGNOSIS — Z8546 Personal history of malignant neoplasm of prostate: Secondary | ICD-10-CM | POA: Insufficient documentation

## 2020-12-12 DIAGNOSIS — I110 Hypertensive heart disease with heart failure: Secondary | ICD-10-CM | POA: Diagnosis not present

## 2020-12-14 DIAGNOSIS — Z7189 Other specified counseling: Secondary | ICD-10-CM | POA: Insufficient documentation

## 2020-12-14 DIAGNOSIS — C3432 Malignant neoplasm of lower lobe, left bronchus or lung: Secondary | ICD-10-CM | POA: Insufficient documentation

## 2020-12-14 DIAGNOSIS — Z85118 Personal history of other malignant neoplasm of bronchus and lung: Secondary | ICD-10-CM | POA: Insufficient documentation

## 2020-12-14 NOTE — Progress Notes (Signed)
Hematology/Oncology Consult note [facility]  [phone] [phone]  Patient Care Team: Verl Bangs, FNP as PCP - General (Family Medicine) Minna Merritts, MD as PCP - Cardiology (Cardiology) Rockey Situ Kathlene November, MD as Consulting Physician (Cardiology) Vanita Ingles, RN as Case Manager ([place]) Vella Raring, [facility] as Pharmacist   Name of the patient: Nathaniel Little  [id number]  October 21, 1945   Date of visit: 12/14/20  Diagnosis- 1.  History of IPMN s/p Whipple's 2.  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 Recently again he was admitted to the hospital for symptoms of pancreatitis following ERCP for choledocholithiasis. Patient had undergone CT angio chest on 05/25/2020 for symptoms of left upper quadrant abdominal pain which incidentally showed a solid 1.5 cm left lower lobe pulmonary nodule which had increased from 1.4 cm on prior CT in June 2021. He has also undergone a PET scan in June 2021 which showed mild hypermetabolism in this area concerning for primary bronchogenic neoplasm. No locoregional adenopathy or distant metastatic disease.  Patient noted to have a 1.5 cm left lower lobe lung nodule for which she underwent definitive surgery left lower lobe lobectomy done on 11/17/2020 showed invasive mucinous adenocarcinoma 1.4 cm.  Margins negative.  No visceral pleural invasion.  0 out of 18 lymph nodes positive for malignancy.  Interval history-patient is recovering well from his lung surgery.  He has baseline fatigue.  Appetite and weight have been stable.  ECOG PS- 1 Pain scale- 0 Opioid associated constipation-  no  Review of systems- Review of Systems  Constitutional: Positive for malaise/fatigue. Negative for chills, fever and weight loss.  HENT: Negative for congestion, ear discharge and nosebleeds.   Eyes: Negative for blurred vision.  Respiratory: Negative for cough, hemoptysis, sputum production, shortness of breath and wheezing.   Cardiovascular: Negative for chest pain, palpitations, orthopnea and claudication.  Gastrointestinal: Negative for abdominal pain, blood in stool, constipation, diarrhea, heartburn, melena, nausea and vomiting.  Genitourinary: Negative for dysuria, flank pain, frequency, hematuria and urgency.  Musculoskeletal: Negative for back pain, joint pain and myalgias.  Skin: Negative for rash.  Neurological: Negative for dizziness, tingling, focal weakness, seizures, weakness and headaches.  Endo/Heme/Allergies: Does not bruise/bleed easily.  Psychiatric/Behavioral: Negative for depression and suicidal ideas. The patient does not have insomnia.       Allergies  Allergen Reactions  . Codeine Itching     Past Medical History:  Diagnosis Date  . (HFpEF) heart failure with preserved ejection fraction ([place])    a. 08/2017 Echo: EF 60-65%, Gr1 DD.  [name] Arthritis   . COPD (chronic obstructive pulmonary disease) ([place])   . Coronary artery disease 2021  . Depression   . Diabetes mellitus without complication ([place])   . Hypertension   . Incidental pulmonary nodule, greater than or equal to 85m 03/11/2020   Left lower lobe - suspicious for bronchogenic neoplasm  . Lymphedema   . Morbid obesity ([place]   . Prostate cancer ([place] 03/2013   ? PANCREATIC  . 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  Severe aortic stenosis   . Sleep apnea with use of continuous positive airway pressure (CPAP)   . Tobacco abuse   . Tremor    HEAD     Past Surgical History:  Procedure  Laterality Date  . ADENOIDECTOMY    . APPENDECTOMY    . CARDIAC CATHETERIZATION    . CATARACT EXTRACTION W/PHACO Left 11/28/2018   Procedure: CATARACT EXTRACTION PHACO AND INTRAOCULAR LENS PLACEMENT (IOC) LEFT, DIABETIC;  Surgeon: Birder Robson, MD;  Location: [facility] ORS;  Service: Ophthalmology;  Laterality: Left;  [name]  00:56 CDE 9.44 Fluid pack lot # [id number]  . EUS N/A 10/13/2017   Procedure: FULL UPPER ENDOSCOPIC ULTRASOUND (EUS) RADIAL;  Surgeon: Holly Bodily, MD;  Location: [facility] ENDOSCOPY;  Service: Gastroenterology;  Laterality: N/A;  . FLEXIBLE BRONCHOSCOPY N/A 11/19/2020   Procedure: FLEXIBLE BRONCHOSCOPY;  Surgeon: Melrose Nakayama, MD;  Location: [place];  Service: Thoracic;  Laterality: N/A;  . INTERCOSTAL NERVE BLOCK Left 11/17/2020   Procedure: INTERCOSTAL NERVE BLOCK;  Surgeon: Melrose Nakayama, MD;  Location: [place];  Service: Thoracic;  Laterality: Left;  . NODE DISSECTION Left 11/17/2020   Procedure: NODE DISSECTION;  Surgeon: Melrose Nakayama, MD;  Location: [place];  Service: Thoracic;  Laterality: Left;  . PANCREATICODUODENECTOMY  07/23/2018  . RIGHT/LEFT HEART CATH AND CORONARY ANGIOGRAPHY N/A 02/21/2020   Procedure: RIGHT/LEFT HEART CATH AND CORONARY ANGIOGRAPHY;  Surgeon: Minna Merritts, MD;  Location: [place] CV LAB;  Service: Cardiovascular;  Laterality: N/A;  . TEE WITHOUT CARDIOVERSION N/A 04/01/2020   Procedure: TRANSESOPHAGEAL ECHOCARDIOGRAM (TEE);  Surgeon: Burnell Blanks, MD;  Location: [place] CV LAB;  Service: Open Heart Surgery;  Laterality: N/A;  . TONSILLECTOMY    . TRANSCATHETER AORTIC VALVE REPLACEMENT, TRANSFEMORAL N/A 04/01/2020   Procedure: TRANSCATHETER AORTIC VALVE REPLACEMENT, TRANSFEMORAL;  Surgeon: Burnell Blanks, MD;  Location: [place] CV LAB;  Service: Open Heart Surgery;  Laterality: N/A;  . wipple      Social History   Socioeconomic History  . Marital status: Widowed    Spouse name: Not on  file  . Number of children: 2  . Years of education: Not on file  . Highest education level: Not on file  Occupational History  . Occupation: retired Social research officer, government  Tobacco Use  . Smoking status: Former Smoker    Packs/day: 4.00    Years: 68.00    Pack years: 272.00    Types: Cigarettes  . Smokeless tobacco: Former Systems developer    Types: Chew, [place] date: 2000  . Tobacco comment: quit smoking 11/17/2020  Vaping Use  . Vaping Use: Never used  Substance and Sexual Activity  . Alcohol use: Not Currently    Alcohol/week: 0.0 - 1.0 standard drinks    Comment: quit 40years ago  . Drug use: No  . Sexual activity: Not Currently  Other Topics Concern  . Not on file  Social History Narrative   Living at [place] Strain: Low Risk   . Difficulty of Paying Living Expenses: Not hard at all  Food Insecurity: No Food Insecurity  . Worried About Charity fundraiser in the Last Year: Never true  . Ran Out of Food in the Last Year: Never true  Transportation Needs: No Transportation Needs  . Lack of Transportation (Medical): No  . Lack of Transportation (Non-Medical): No  Physical Activity: Inactive  . Days of  Exercise per Week: 0 days  . Minutes of Exercise per Session: 0 min  Stress: No Stress Concern Present  . Feeling of Stress : Not at all  Social Connections: Moderately Isolated  . Frequency of Communication with Friends and Family: More than three times a week  . Frequency of Social Gatherings with Friends and Family: More than three times a week  . Attends Religious Services: More than 4 times per year  . Active Member of Clubs or Organizations: No  . Attends Archivist Meetings: Never  . Marital Status: Widowed  Intimate Partner Violence: Not At Risk  . Fear of Current or Ex-Partner: No  . Emotionally Abused: No  . Physically Abused: No  . Sexually Abused: No    Family History  Problem  Relation Age of Onset  . Breast cancer Mother   . Cancer Father        Black Lung  . Cerebral palsy Brother   . Tuberculosis Paternal Uncle   . Prostate cancer Neg Hx   . Kidney cancer Neg Hx   . Bladder Cancer Neg Hx      Current Outpatient Medications:  .  acetaminophen (TYLENOL) 325 MG tablet, Take 2 tablets (650 mg total) by mouth every 6 (six) hours as needed., Disp: , Rfl:  .  albuterol (PROVENTIL) (2.5 MG/3ML) 0.083% nebulizer solution, Take 2.5 mg by nebulization every 6 (six) hours as needed for wheezing or shortness of breath., Disp: , Rfl:  .  albuterol (VENTOLIN HFA) 108 (90 Base) MCG/ACT inhaler, Inhale 2 puffs into the lungs every 6 (six) hours as needed for wheezing or shortness of breath., Disp: , Rfl:  .  amoxicillin-clavulanate (AUGMENTIN) 875-125 MG tablet, Take 1 tablet by mouth every 12 (twelve) hours., Disp: 20 tablet, Rfl: 0 .  aspirin EC 81 MG tablet, Take 81 mg by mouth daily., Disp: , Rfl:  .  atorvastatin (LIPITOR) 80 MG tablet, Take 80 mg by mouth daily. , Disp: , Rfl:  .  Budeson-Glycopyrrol-Formoterol (BREZTRI AEROSPHERE) 160-9-4.8 MCG/ACT AERO, Inhale 2 puffs into the lungs 2 (two) times daily., Disp: 5.9 g, Rfl: 0 .  carvedilol (COREG) 6.25 MG tablet, Take 1 tablet (6.25 mg total) by mouth 2 (two) times daily with a meal., Disp: 180 tablet, Rfl: 3 .  cloNIDine (CATAPRES) 0.2 MG tablet, Take 1 tablet (0.2 mg total) by mouth 2 (two) times daily., Disp: 60 tablet, Rfl: 1 .  clopidogrel (PLAVIX) 75 MG tablet, TAKE 1 TABLET BY MOUTH ONCE DAILY (Patient taking differently: Take 75 mg by mouth daily.), Disp: 90 tablet, Rfl: 2 .  diclofenac Sodium (VOLTAREN) 1 % GEL, Apply 1 application topically 3 (three) times daily as needed (pain)., Disp: , Rfl:  .  ezetimibe (ZETIA) 10 MG tablet, Take 1 tablet (10 mg total) by mouth daily., Disp: 90 tablet, Rfl: 3 .  ferrous [id number] C-folic acid (TRINSICON / FOLTRIN) capsule, Take 1 capsule by mouth 2 (two) times  daily after a meal., Disp: 60 capsule, Rfl: 1 .  fluticasone (FLONASE) 50 MCG/ACT nasal spray, Place 1 spray into both nostrils daily as needed for allergies., Disp: , Rfl:  .  furosemide (LASIX) 40 MG tablet, Take 1 tablet (40 mg total) by mouth daily., Disp: 5 tablet, Rfl: 0 .  gabapentin (NEURONTIN) 100 MG capsule, Take 1-2 capsules (100-200 mg total) by mouth at bedtime. (Patient taking differently: Take 200 mg by mouth at bedtime.), Disp: 180 capsule, Rfl: 1 .  insulin glargine (LANTUS) 100 unit/mL  SOPN, Inject 20 Units into the skin at bedtime., Disp: , Rfl:  .  ipratropium-albuterol (DUONEB) 0.5-2.5 (3) MG/3ML SOLN, Take 3 mLs by nebulization every 6 (six) hours as needed (Asthma)., Disp: , Rfl:  .  metFORMIN (GLUCOPHAGE) 500 MG tablet, Take 1,000 mg by mouth 2 (two) times daily. , Disp: , Rfl:  .  potassium chloride (KLOR-CON) 10 MEQ tablet, Take 2 tablets (20 mEq total) by mouth daily., Disp: 5 tablet, Rfl: 0 .  traZODone (DESYREL) 100 MG tablet, Take 100 mg by mouth at bedtime., Disp: , Rfl:  .  pantoprazole (PROTONIX) 40 MG tablet, Take 1 tablet (40 mg total) by mouth 2 (two) times daily., Disp: 60 tablet, Rfl: 11  Physical exam:  Vitals:   12/12/20 1354  BP: 125/69  Pulse: 72  Temp: (!) 96.8 F (36 C)  TempSrc: Tympanic  SpO2: 100%  Weight: 203 lb 6.4 oz (92.3 kg)   Physical Exam Constitutional:      General: He is not in acute distress. Eyes:     Pupils: Pupils are equal, round, and reactive to light.  Cardiovascular:     Rate and Rhythm: Normal rate and regular rhythm.     Heart sounds: Normal heart sounds.  Pulmonary:     Effort: Pulmonary effort is normal.     Breath sounds: Normal breath sounds.  Skin:    General: Skin is warm and dry.  Neurological:     Mental Status: He is alert and oriented to person, place, and time.      CMP Latest Ref Rng & Units 11/20/2020  Glucose 70 - 99 mg/dL 102(H)  BUN 8 - 23 mg/dL 23  Creatinine 0.61 - 1.24 mg/dL 1.20  Sodium  135 - 145 mmol/L 137  Potassium 3.5 - 5.1 mmol/L 3.9  Chloride 98 - 111 mmol/L 104  CO2 22 - 32 mmol/L 25  Calcium 8.9 - 10.3 mg/dL 8.5(L)  Total Protein 6.5 - 8.1 g/dL -  Total Bilirubin 0.3 - 1.2 mg/dL -  Alkaline Phos 38 - 126 U/L -  AST 15 - 41 U/L -  ALT 0 - 44 U/L -   CBC Latest Ref Rng & Units 11/23/2020  WBC 4.0 - 10.5 K/uL 8.1  Hemoglobin 13.0 - 17.0 g/dL 8.5(L)  Hematocrit 39.0 - 52.0 % 25.3(L)  Platelets 150 - 400 K/uL 156    No images are attached to the encounter.  DG Chest 2 View  Result Date: 12/02/2020 CLINICAL DATA:  History of lymph node dissection.  Short of breath. EXAM: CHEST - 2 VIEW COMPARISON:  11/23/2020 FINDINGS: Heart size normal. TAVR unchanged in position. Pulmonary vascularity normal. Mild to moderate left pleural effusion. Left lower lobe consolidation with mild interval improvement. Right lung remains clear. IMPRESSION: Left lower lobe consolidation with mild improvement. Mild to moderate left effusion. Negative for heart failure. Electronically Signed   By: Franchot Gallo M.D.   On: 12/02/2020 15:36   DG Chest 2 View  Result Date: 11/22/2020 CLINICAL DATA:  Status post left lower lobectomy. EXAM: CHEST - 2 VIEW COMPARISON:  11/21/2020 FINDINGS: Stable cardiomediastinal contours. Status post left lower lobectomy with left lung volume loss. The left chest tube has been removed. There is a small pneumothorax identified within the left apex which measures approximately 1.1 cm in thickness. Left pleural effusion and left base atelectasis disease is unchanged. IMPRESSION: 1. Status post left lower lobectomy with small left apical pneumothorax. Recommend attention on follow-up imaging. 2. Stable left pleural effusion  and left base atelectasis/airspace disease. These results will be called to the ordering clinician or representative by the Radiologist Assistant, and communication documented in the PACS or [employer]. Electronically Signed   By: Kerby Moors  M.D.   On: 11/22/2020 08:25   DG Chest 2 View  Result Date: 11/16/2020 CLINICAL DATA:  Encounter for nodule of lower lobe of left lung. Preop. EXAM: CHEST - 2 VIEW COMPARISON:  CT chest 09/16/2020, chest x-ray 05/25/2020 FINDINGS: The heart size and mediastinal contours are within normal limits. Aortic valve replacement. Aortic arch calcification. Known 1.5 cm left lower lobe pulmonary nodule not identified on chest x-ray; however, please note a CT chest is much more sensitive for evaluation of such a lesion. No focal consolidation. No pulmonary edema. No pleural effusion. No pneumothorax. No acute osseous abnormality. Multilevel degenerative changes of the spine. IMPRESSION: 1. No active cardiopulmonary disease. 2. Known left lower lobe pulmonary nodule not properly evaluated on chest x-ray. CT chest is a much more sensitive evaluation for such a lesion. Electronically Signed   By: Iven Finn M.D.   On: 11/16/2020 01:00   DG Chest 1V REPEAT Same Day  Result Date: 11/21/2020 CLINICAL DATA:  Status post left lung lobectomy. EXAM: CHEST - 1 VIEW SAME DAY COMPARISON:  Same day. FINDINGS: Stable cardiomegaly. Left-sided chest tube has been removed. No pneumothorax is noted. Right lung is clear. Stable left basilar atelectasis is noted. Bony thorax is unremarkable. IMPRESSION: Status post removal of left-sided chest tube. No pneumothorax is noted. Electronically Signed   By: Marijo Conception M.D.   On: 11/21/2020 12:48   DG CHEST PORT 1 VIEW  Result Date: 11/23/2020 CLINICAL DATA:  Status post LEFT LOWER lobectomy. EXAM: PORTABLE CHEST 1 VIEW COMPARISON:  11/22/2020 and prior studies FINDINGS: LEFT lobectomy changes again noted. There is no evidence of pneumothorax. Pulmonary vascular congestion, LEFT LOWER lung atelectasis/opacities and small LEFT pleural effusion are again noted. Cardiomegaly and aortic valve replacement again noted. IMPRESSION: Stable chest radiograph with LEFT lobectomy changes, LEFT  LOWER lung atelectasis/opacities and small LEFT pleural effusion. No evidence of pneumothorax. Electronically Signed   By: Margarette Canada M.D.   On: 11/23/2020 08:09   DG CHEST PORT 1 VIEW  Result Date: 11/21/2020 CLINICAL DATA:  Chest tube, pneumothorax, shortness of breath EXAM: PORTABLE CHEST 1 VIEW COMPARISON:  11/20/2020 FINDINGS: Left chest tube remains in place. No visible pneumothorax. Changes of TAVR. Heart is borderline in size. Bibasilar atelectasis or infiltrates, left greater than right, unchanged. IMPRESSION: No significant change since prior study.  No pneumothorax. Electronically Signed   By: Rolm Baptise M.D.   On: 11/21/2020 07:48   DG CHEST PORT 1 VIEW  Result Date: 11/20/2020 CLINICAL DATA:  Chest tube, follow-up EXAM: PORTABLE CHEST 1 VIEW COMPARISON:  Portable exam 0646 hours compared to 11/19/2020 FINDINGS: LEFT thoracostomy tube unchanged. RIGHT jugular line with tip projecting over SVC. Upper normal size of cardiac silhouette post TAVR. Atherosclerotic calcification aorta. Persistent volume loss in LEFT hemithorax with improved atelectasis in LEFT lung since previous exam. Small LEFT apex pneumothorax identified. Minimal atelectasis at RIGHT base, RIGHT lung otherwise clear. IMPRESSION: Improved atelectasis in LEFT lung. Small LEFT apex pneumothorax despite thoracostomy tube. Electronically Signed   By: Lavonia Dana M.D.   On: 11/20/2020 08:28   DG Chest Port 1 View  Result Date: 11/19/2020 CLINICAL DATA:  Status post bronchoscopy. EXAM: PORTABLE CHEST 1 VIEW COMPARISON:  Same day. FINDINGS: Stable cardiomegaly. Left-sided chest tube is  noted without pneumothorax. Stable left lung atelectasis or infiltrate is noted. Right lung is clear. Right internal jugular catheter is unchanged. Bony thorax is unremarkable. IMPRESSION: Stable left-sided chest tube without pneumothorax. Stable left lung atelectasis or infiltrate. No other significant abnormality seen. Electronically Signed   By:  Marijo Conception M.D.   On: 11/19/2020 11:29   DG CHEST PORT 1 VIEW  Result Date: 11/19/2020 CLINICAL DATA:  Chest tube, pneumothorax EXAM: PORTABLE CHEST 1 VIEW COMPARISON:  11/18/2020 FINDINGS: Left chest tube remains in place, unchanged. Near complete opacification of the left hemithorax. Lucency over the left apex may reflect residual pneumothorax. Right lung clear. Prior TAVR. Right central line is unchanged. IMPRESSION: Left chest tube remains in place with near complete opacification of the left hemithorax and lucency over the left apex which may reflect residual pneumothorax. Electronically Signed   By: Rolm Baptise M.D.   On: 11/19/2020 07:03   DG CHEST PORT 1 VIEW  Result Date: 11/18/2020 CLINICAL DATA:  Pneumothorax EXAM: PORTABLE CHEST 1 VIEW COMPARISON:  Radiograph 11/18/2020 at 5:57 FINDINGS: Interval collapse of the LEFT upper lobe. Superior LEFT pneumothorax again noted. LEFT chest tube in place. RIGHT lung clear. PICC line unchanged IMPRESSION: 1. Moderate volume LEFT pneumothorax with chest tube in place. 2. Interval collapse of the LEFT upper lobe. Electronically Signed   By: Suzy Bouchard M.D.   On: 11/18/2020 16:19   DG CHEST PORT 1 VIEW  Result Date: 11/18/2020 CLINICAL DATA:  Chest tube.  Prior lobectomy. EXAM: PORTABLE CHEST 1 VIEW COMPARISON:  11/17/2020. FINDINGS: Right central line and left chest tube in stable position. Postsurgical changes left lung again noted without interim change. Persistent bibasilar atelectasis. No prominent pleural effusion. Stable left-sided pleural thickening. No significant pneumothorax visualized. Prior cardiac valve replacement. Stable cardiomegaly. No pulmonary venous congestion. Left chest wall subcutaneous emphysema again noted. IMPRESSION: 1. Right central line and left chest tube in stable position. No significant pneumothorax visualized. Left chest wall subcutaneous emphysema again noted. 2. Postsurgical changes left lung again noted.  Persistent bibasilar atelectasis. Electronically Signed   By: Marcello Moores  Register   On: 11/18/2020 08:00   DG Chest Port 1 View  Result Date: 11/17/2020 CLINICAL DATA:  Post LEFT lower lobectomy. EXAM: PORTABLE CHEST 1 VIEW COMPARISON:  November 14, 2020 FINDINGS: RIGHT IJ central venous access device terminates at the caval to atrial junction. LEFT-sided chest tube terminates near the LEFT upper chest at the level of the aortic arch, just lateral to the aortic arch. Leads project over the chest. Signs of trans arterial aortic valve replacement as before. Chain sutures along the LEFT mediastinal border related to LEFT lower lobectomy. Cardiomediastinal contours are otherwise unchanged. No visible pneumothorax with LEFT-sided chest tube in place. RIGHT lung is clear. Retrocardiac opacity on the LEFT. On limited assessment no acute skeletal process. IMPRESSION: 1. Expected postoperative appearance of the chest post LEFT lower lobectomy. No visible pneumothorax. 2. LEFT lower lobe density, retrocardiac may represent atelectasis, attention on follow-up. 3. RIGHT IJ central venous access device in place. Electronically Signed   By: Zetta Bills M.D.   On: 11/17/2020 12:19   DG Abd 2 Views  Result Date: 11/22/2020 CLINICAL DATA:  Status post LEFT LOWER lobectomy for non-small cell carcinoma. EXAM: ABDOMEN - 2 VIEW COMPARISON:  11/21/2020 and prior studies FINDINGS: Mild gaseous distension of small bowel and colon noted. Gas and stool within the colon is identified. There is no evidence of pneumoperitoneum. LEFT LOWER lung airspace disease/atelectasis and  LEFT pleural effusion noted. IMPRESSION: Mild gaseous distension of small bowel and colon which may represent ileus. Electronically Signed   By: Margarette Canada M.D.   On: 11/22/2020 08:20     Assessment and plan- Patient is a 75 y.o. 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  No evidence of visceral pleural invasion.  18 lymph nodes were negative for malignancy.  For stage Ia disease patient does not require any adjuvant chemotherapy.  I will plan to repeat a CT chest without contrast in 6 months and see him thereafter.  For his IPMN he has undergone a Whipple surgery and continues to follow-up with [facility]  Cancer Staging Malignant neoplasm of lower lobe of left lung [facility]) Staging form: Lung, AJCC 8th Edition - Pathologic stage from 11/21/2020: Stage IA2 (pT1b, pN0, cM0) - Signed by Sindy Guadeloupe, MD on 12/14/2020     Visit Diagnosis 1. Malignant neoplasm of lower lobe of left lung ([place])   2. Goals of care, counseling/discussion      Dr. Randa Evens, MD, MPH [facility] at [facility] [phone] 12/14/2020 2:48 PM

## 2020-12-15 ENCOUNTER — Telehealth: Payer: Self-pay | Admitting: General Practice

## 2020-12-15 ENCOUNTER — Telehealth: Payer: Self-pay

## 2020-12-15 ENCOUNTER — Ambulatory Visit: Payer: Medicare Other | Admitting: Oncology

## 2020-12-15 NOTE — Telephone Encounter (Signed)
  Chronic Care Management   Outreach Note  12/15/2020 Name: Nathaniel Little MRN: [id number] DOB: [birth year]  Referred by: Verl Bangs, FNP Reason for referral : Appointment (RNCM: Follow up for Chronic Disease Management and Care Coordination Needs )   An unsuccessful telephone outreach was attempted today. The patient was referred to the case management team for assistance with care management and care coordination.   Follow Up Plan: A HIPAA compliant phone message was left for the patient providing contact information and requesting a return call.   Noreene Larsson RN, MSN, [place] Mobile: [phone]

## 2020-12-19 ENCOUNTER — Ambulatory Visit: Payer: Medicare Other | Admitting: Physician Assistant

## 2020-12-21 DIAGNOSIS — I5030 Unspecified diastolic (congestive) heart failure: Secondary | ICD-10-CM | POA: Diagnosis not present

## 2020-12-21 DIAGNOSIS — J449 Chronic obstructive pulmonary disease, unspecified: Secondary | ICD-10-CM | POA: Diagnosis not present

## 2020-12-22 ENCOUNTER — Other Ambulatory Visit: Payer: Self-pay | Admitting: Thoracic Surgery (Cardiothoracic Vascular Surgery)

## 2020-12-22 DIAGNOSIS — C3432 Malignant neoplasm of lower lobe, left bronchus or lung: Secondary | ICD-10-CM

## 2020-12-22 DIAGNOSIS — Z952 Presence of prosthetic heart valve: Secondary | ICD-10-CM

## 2020-12-23 ENCOUNTER — Encounter: Payer: Self-pay | Admitting: Thoracic Surgery (Cardiothoracic Vascular Surgery)

## 2020-12-23 ENCOUNTER — Ambulatory Visit
Admission: RE | Admit: 2020-12-23 | Discharge: 2020-12-23 | Disposition: A | Discharge status: Home / Self Care | Payer: Medicare Other | Source: Ambulatory Visit | Attending: Thoracic Surgery (Cardiothoracic Vascular Surgery) | Admitting: Thoracic Surgery (Cardiothoracic Vascular Surgery)

## 2020-12-23 ENCOUNTER — Other Ambulatory Visit: Payer: Self-pay

## 2020-12-23 ENCOUNTER — Ambulatory Visit (INDEPENDENT_AMBULATORY_CARE_PROVIDER_SITE_OTHER): Payer: Self-pay | Admitting: Thoracic Surgery (Cardiothoracic Vascular Surgery)

## 2020-12-23 VITALS — BP 150/80 | HR 85 | Resp 20 | Ht 69.0 in | Wt 203.0 lb

## 2020-12-23 DIAGNOSIS — R6889 Other general symptoms and signs: Secondary | ICD-10-CM | POA: Diagnosis not present

## 2020-12-23 DIAGNOSIS — I5030 Unspecified diastolic (congestive) heart failure: Secondary | ICD-10-CM | POA: Diagnosis not present

## 2020-12-23 DIAGNOSIS — Z952 Presence of prosthetic heart valve: Secondary | ICD-10-CM

## 2020-12-23 DIAGNOSIS — J9819 Other pulmonary collapse: Secondary | ICD-10-CM | POA: Diagnosis not present

## 2020-12-23 DIAGNOSIS — C3432 Malignant neoplasm of lower lobe, left bronchus or lung: Secondary | ICD-10-CM

## 2020-12-23 DIAGNOSIS — C3490 Malignant neoplasm of unspecified part of unspecified bronchus or lung: Secondary | ICD-10-CM | POA: Diagnosis not present

## 2020-12-23 DIAGNOSIS — J449 Chronic obstructive pulmonary disease, unspecified: Secondary | ICD-10-CM | POA: Diagnosis not present

## 2020-12-23 DIAGNOSIS — Z9889 Other specified postprocedural states: Secondary | ICD-10-CM

## 2020-12-23 DIAGNOSIS — J90 Pleural effusion, not elsewhere classified: Secondary | ICD-10-CM | POA: Diagnosis not present

## 2020-12-23 NOTE — Progress Notes (Signed)
[address]       [place] [address]             [phone]     HPI: [name] returns for scheduled follow-up visit  Nathaniel Little is a 75 year old man with a history of tobacco abuse, COPD, aortic stenosis, TAVR, IPMN status post Whipple, choledocholithiasis, pancreatitis, obesity, sleep apnea, arthritis, type 2 diabetes, lymphedema, and stage Ia adenocarcinoma of the lung.  He was found to have a lung nodule on a CT prior to TAVR in June 2021.  PET/CT showed mild uptake with an SUV of 2.  Went ahead and had the TAVR performed without incident.  On follow-up CTs it was unchanged in August but had increased in size to 1.5 cm by December 2021.  I did a robotic left lower lobectomy and node dissection on 11/17/2020.  He required bronchoscopy for mucous plugging and atelectasis postop but otherwise his course was unremarkable.  He went home on day 6.  He did go home on oxygen.  Last time on 12/02/2020.  He was still having a fair amount of pain at that time.  In the interim since his last visit he has done well.  He has minimal pain.  He uses his oxygen at night as he waits for his new CPAP machine to arrive.  He carries his oxygen with him during the day in case he needs it but does not have to use it very often.  He did see Dr. Janese Banks and does not require any adjuvant therapy.  Past Medical History:  Diagnosis Date  . (HFpEF) heart failure with preserved ejection fraction ([place])    a. 08/2017 Echo: EF 60-65%, Gr1 DD.  [name] Arthritis   . COPD (chronic obstructive pulmonary disease) ([place])   . Coronary artery disease 2021  . Depression   . Diabetes mellitus without complication ([place])   . Hypertension   . Incidental pulmonary nodule, greater than or equal to 70mm 03/11/2020   Left lower lobe - suspicious for bronchogenic neoplasm  . Lymphedema   . Morbid obesity ([place])   . Prostate cancer ([place]) 03/2013   ? PANCREATIC  . S/P TAVR (transcatheter aortic valve replacement)  04/01/2020   s/p TAVR with a 29 mm Edwards Sapien 3 THV via the TF approach by Drs Buena Irish & Roxy Manns  . Severe aortic stenosis   . Sleep apnea with use of continuous positive airway pressure (CPAP)   . Tobacco abuse   . Tremor    HEAD    Current Outpatient Medications  Medication Sig Dispense Refill  . acetaminophen (TYLENOL) 325 MG tablet Take 2 tablets (650 mg total) by mouth every 6 (six) hours as needed.    [name] albuterol (PROVENTIL) (2.5 MG/3ML) 0.083% nebulizer solution Take 2.5 mg by nebulization every 6 (six) hours as needed for wheezing or shortness of breath.    [name] albuterol (VENTOLIN HFA) 108 (90 Base) MCG/ACT inhaler Inhale 2 puffs into the lungs every 6 (six) hours as needed for wheezing or shortness of breath.    [name] amoxicillin-clavulanate (AUGMENTIN) 875-125 MG tablet Take 1 tablet by mouth every 12 (twelve) hours. 20 tablet 0  . aspirin EC 81 MG tablet Take 81 mg by mouth daily.    [name] atorvastatin (LIPITOR) 80 MG tablet Take 80 mg by mouth daily.     . Budeson-Glycopyrrol-Formoterol (BREZTRI AEROSPHERE) 160-9-4.8 MCG/ACT AERO Inhale 2 puffs into the lungs 2 (two) times daily. 5.9 g 0  .  carvedilol (COREG) 6.25 MG tablet Take 1 tablet (6.25 mg total) by mouth 2 (two) times daily with a meal. 180 tablet 3  . cloNIDine (CATAPRES) 0.2 MG tablet Take 1 tablet (0.2 mg total) by mouth 2 (two) times daily. 60 tablet 1  . clopidogrel (PLAVIX) 75 MG tablet TAKE 1 TABLET BY MOUTH ONCE DAILY (Patient taking differently: Take 75 mg by mouth daily.) 90 tablet 2  . diclofenac Sodium (VOLTAREN) 1 % GEL Apply 1 application topically 3 (three) times daily as needed (pain).    [name] ezetimibe (ZETIA) 10 MG tablet Take 1 tablet (10 mg total) by mouth daily. 90 tablet 3  . ferrous [id number] C-folic acid (TRINSICON / FOLTRIN) capsule Take 1 capsule by mouth 2 (two) times daily after a meal. 60 capsule 1  . fluticasone (FLONASE) 50 MCG/ACT nasal spray Place 1 spray into both nostrils daily as needed  for allergies.    . furosemide (LASIX) 40 MG tablet Take 1 tablet (40 mg total) by mouth daily. 5 tablet 0  . gabapentin (NEURONTIN) 100 MG capsule Take 1-2 capsules (100-200 mg total) by mouth at bedtime. (Patient taking differently: Take 200 mg by mouth at bedtime.) 180 capsule 1  . insulin glargine (LANTUS) 100 unit/mL SOPN Inject 20 Units into the skin at bedtime.    [name] ipratropium-albuterol (DUONEB) 0.5-2.5 (3) MG/3ML SOLN Take 3 mLs by nebulization every 6 (six) hours as needed (Asthma).    . metFORMIN (GLUCOPHAGE) 500 MG tablet Take 1,000 mg by mouth 2 (two) times daily.     . potassium chloride (KLOR-CON) 10 MEQ tablet Take 2 tablets (20 mEq total) by mouth daily. 5 tablet 0  . traZODone (DESYREL) 100 MG tablet Take 100 mg by mouth at bedtime.    . pantoprazole (PROTONIX) 40 MG tablet Take 1 tablet (40 mg total) by mouth 2 (two) times daily. 60 tablet 11   No current facility-administered medications for this visit.    Physical Exam BP (!) 150/80   Pulse 85   Resp 20   Ht 5\' 9"  (1.753 m)   Wt 203 lb (92.1 kg)   SpO2 97% Comment: RA  BMI 29.93 kg/m  75 year old man in no acute distress Alert and oriented x3 with no focal deficits Lungs diminished at left base but otherwise clear Cardiac regular rate and rhythm Incisions well-healed  Diagnostic Tests: CHEST - 2 VIEW  COMPARISON:  12/02/2020  FINDINGS: Left lower lobe collapse and consolidation with associated left-sided volume loss and moderate left pleural effusion are unchanged. Right lung is clear. No pneumothorax. No pleural effusion on the right. Cardiac size within normal limits. Transcatheter aortic valve replacement has been performed. The pulmonary vascularity is normal. The osseous structures are age-appropriate.  IMPRESSION: Stable examination with left lower lobe collapse and consolidation and associated moderate left pleural effusion. In   Electronically Signed   By: Fidela Salisbury MD   On:  12/23/2020 16:10 I personally reviewed the chest x-ray images.  There are postoperative changes secondary to left lower lobectomy.  Impression: Nathaniel Little is a 75 year old man with a history of tobacco abuse, COPD, aortic stenosis, TAVR, IPMN status post Whipple, choledocholithiasis, pancreatitis, obesity, sleep apnea, arthritis, type 2 diabetes, lymphedema, and stage Ia adenocarcinoma of the lung.   I did a robotic assisted left lower lobectomy and node dissection on 11/17/2020.  His nodule turned out to be a stage Ia adenocarcinoma.  He saw Dr. Janese Banks.  No adjuvant therapy is necessary.  She is planning  to do another CT in 6 months.  He is still using oxygen primarily at night as he awaits a new CPAP machine.  Hopefully once he gets that machine he can wean off the oxygen altogether.  He will follow-up with Dr. Patsey Berthold for his pulmonary care.  From a surgical standpoint he is doing well.  He has minimal discomfort and is not requiring any narcotics at this point.  I am very happy with his progress.  He does have a small pleural effusion filling the space from his lower lobectomy.  Plan: Follow-up with Dr. Janese Banks and Dr. Patsey Berthold I will be happy to see Nathaniel Little back anytime if I can be of any further assistance with his care  Melrose Nakayama, MD [facility] [phone]

## 2020-12-25 ENCOUNTER — Telehealth: Payer: Self-pay | Admitting: *Deleted

## 2020-12-25 NOTE — Telephone Encounter (Signed)
Please reschedule for SG RN CM

## 2020-12-25 NOTE — Chronic Care Management (AMB) (Signed)
  Care Management   Note  12/25/2020 Name: Nathaniel Little MRN: [id number] DOB: March 04, 1946  Nathaniel Little is a 75 y.o. year old male who is a primary care patient of [name], [name], [place] and is actively engaged with the care management team. I reached out to [name] by phone today to assist with re-scheduling a follow up visit with the RN Case Manager  Follow up plan: Unsuccessful telephone outreach attempt made. A HIPAA compliant phone message was left for the patient providing contact information and requesting a return call.  The care management team will reach out to the patient again over the next 7 days.  If patient returns call to provider office, please advise to call [place] Lysle Morales at [facility]

## 2020-12-29 NOTE — Chronic Care Management (AMB) (Signed)
  Care Management   Note  12/29/2020 Name: Nathaniel Little MRN: [id number] DOB: 02-08-1946  Nathaniel Little is a 75 y.o. year old male who is a primary care patient of [name], [name], [place] and is actively engaged with the care management team. I reached out to [name] by phone today to assist with re-scheduling a follow up visit with the RN Case Manager  Follow up plan: A second unsuccessful telephone outreach attempt made. A HIPAA compliant phone message was left for the patient providing contact information and requesting a return call.  The care management team will reach out to the patient again over the next 7 days.  If patient returns call to provider office, please advise to call [place] Lysle Morales at [facility]

## 2021-01-02 NOTE — Chronic Care Management (AMB) (Signed)
  Care Management   Note  01/02/2021 Name: [name] MRN: [id number] DOB: 10-Apr-1946  [name] is a 75 y.o. year old male who is a primary care patient of [name], [name], [place] and is actively engaged with the care management team. I reached out to [name] by phone today to assist with re-scheduling a follow up visit with the RN Case Manager  Follow up plan: Unsuccessful telephone outreach attempt made. A HIPAA compliant phone message was left for the patient providing contact information and requesting a return call.  Unable to make contact on outreach attempts x 3. PCP [name],Nicole M, FNP notified via routed documentation in medical record.  We have been unable to make contact with the patient for follow up. The care management team is available to follow up with the patient after provider conversation with the patient regarding recommendation for care management engagement and subsequent re-referral to the care management team.  If patient returns call to provider office, please advise to call [place] Lysle Morales at [facility]

## 2021-01-06 ENCOUNTER — Encounter: Payer: Self-pay | Admitting: Unknown Physician Specialty

## 2021-01-06 ENCOUNTER — Ambulatory Visit (INDEPENDENT_AMBULATORY_CARE_PROVIDER_SITE_OTHER): Payer: Medicare Other | Admitting: Unknown Physician Specialty

## 2021-01-06 ENCOUNTER — Other Ambulatory Visit: Payer: Self-pay

## 2021-01-06 VITALS — BP 134/58 | HR 79 | Temp 97.7°F | Ht 69.0 in | Wt 204.4 lb

## 2021-01-06 DIAGNOSIS — I10 Essential (primary) hypertension: Secondary | ICD-10-CM

## 2021-01-06 DIAGNOSIS — J449 Chronic obstructive pulmonary disease, unspecified: Secondary | ICD-10-CM

## 2021-01-06 DIAGNOSIS — E1142 Type 2 diabetes mellitus with diabetic polyneuropathy: Secondary | ICD-10-CM | POA: Diagnosis not present

## 2021-01-06 DIAGNOSIS — E782 Mixed hyperlipidemia: Secondary | ICD-10-CM

## 2021-01-06 MED ORDER — BREZTRI AEROSPHERE 160-9-4.8 MCG/ACT IN AERO: 2.0000 | INHALATION_SPRAY | Freq: Two times a day (BID) | RESPIRATORY_TRACT | 12 refills | Status: DC

## 2021-01-06 MED ORDER — IPRATROPIUM-ALBUTEROL 0.5-2.5 (3) MG/3ML IN SOLN: 3.0000 mL | Freq: Four times a day (QID) | RESPIRATORY_TRACT | 12 refills | Status: DC | PRN

## 2021-01-06 NOTE — Assessment & Plan Note (Signed)
Hgb A1c 6.9% in February 2022. Currently at goal. Patient to continue Metformin 1000 mg twice daily and Lantus 20 units at bedtime. Will recheck Hgb A1c in 4 months.

## 2021-01-06 NOTE — Progress Notes (Addendum)
BP (!) 134/58 (BP Location: Right Arm, Patient Position: Sitting, Cuff Size: Normal)   Pulse 79   Temp 97.7 F (36.5 C) (Temporal)   Ht 5\' 9"  (1.753 m)   Wt 204 lb 6.4 oz (92.7 kg)   SpO2 98%   BMI 30.18 kg/m    Subjective:    Patient ID: Nathaniel Little, male    DOB: 1946/03/12, 75 y.o.   MRN: [id number]  HPI: Nathaniel Little is a 75 y.o. male  Chief Complaint  Patient presents with  . Hypertension    Pt needs albuterol sulfate (Proventil) refill.   Diabetes: Using medications without difficulties No hypoglycemic episodes No hyperglycemic episodes Feet problems: none Last Hgb A1C:6.9 2 months ago  Hypertension  Using medications without difficulty Average home BPs 140s-150's  Using medication without problems or lightheadedness No chest pain with exertion or shortness of breath No Edema  Elevated Cholesterol Using medications without problems No Muscle aches   COPD Patient had recent left lower lobectomy (February 2022). States he has stopped smoking since surgery. Patient feels that the new inhaler Judithann Sauger has helped him quite a bit. Patient is asking for nebulizer refill as he uses this about once a month and would like to have it handy. He also states that he rarely uses his rescue inhaler but carries it with him in case it is needed. Patient also asking for refill of Breztri.    Relevant past medical, surgical, family and social history reviewed and updated as indicated. Interim medical history since our last visit reviewed. Allergies and medications reviewed and updated.  Review of Systems  Constitutional: Negative for fatigue and fever.  Respiratory: Negative for cough.   Cardiovascular: Negative for chest pain, palpitations and leg swelling.  Gastrointestinal: Negative for diarrhea, nausea and vomiting.  Genitourinary: Negative.   Skin: Negative.   Neurological: Negative for dizziness, light-headedness and headaches.  Psychiatric/Behavioral: Negative.      Per HPI unless specifically indicated above     Objective:    BP (!) 134/58 (BP Location: Right Arm, Patient Position: Sitting, Cuff Size: Normal)   Pulse 79   Temp 97.7 F (36.5 C) (Temporal)   Ht 5\' 9"  (1.753 m)   Wt 204 lb 6.4 oz (92.7 kg)   SpO2 98%   BMI 30.18 kg/m   Wt Readings from Last 3 Encounters:  01/06/21 204 lb 6.4 oz (92.7 kg)  12/23/20 203 lb (92.1 kg)  12/12/20 203 lb 6.4 oz (92.3 kg)    Physical Exam HENT:     Head: Normocephalic and atraumatic.  Cardiovascular:     Rate and Rhythm: Normal rate and regular rhythm.     Pulses: Normal pulses.     Heart sounds: Normal heart sounds.  Pulmonary:     Effort: Pulmonary effort is normal.     Breath sounds: Normal breath sounds.     Comments: Recent left lower lobectomy. Abdominal:     General: Bowel sounds are normal.     Palpations: Abdomen is soft.  Skin:    General: Skin is warm and dry.  Neurological:     Mental Status: He is alert and oriented to person, place, and time.  Psychiatric:        Mood and Affect: Mood normal.        Behavior: Behavior normal.       Assessment & Plan:   Problem List Items Addressed This Visit      Cardiovascular and Mediastinum   Essential hypertension  Patient's blood pressure 134/58 today. No complaints of dizziness or syncope. Continue current regimen of carvedilol, clonidine, and Lasix. Follow-up in 4 months.         Respiratory   Chronic obstructive pulmonary disease ([place])    Patient has now stopped smoking since recent left lower lobe lobectomy (Feb 2022). He is using 2-3L via [place] at night while at home. States he rarely uses albuterol rescue inhaler and the new prescription for Judithann Sauger is helping with COPD control. Patient is requesting refill on new inhaler and duoneb as he needs a breathing treatment every month or so. Refills entered.       Relevant Medications   ipratropium-albuterol (DUONEB) 0.5-2.5 (3) MG/3ML SOLN   Budeson-Glycopyrrol-Formoterol  (BREZTRI AEROSPHERE) 160-9-4.8 MCG/ACT AERO     Endocrine   DM type 2 with diabetic peripheral neuropathy (HCC)    Hgb A1c 6.9% in February 2022. Currently at goal. Patient to continue Metformin 1000 mg twice daily and Lantus 20 units at bedtime. Will recheck Hgb A1c in 4 months.         Other   Hyperlipidemia - Primary    Patient on Atorvastatin 80 mg and Zetia 10 mg. CBC and CMP completed Feb 2022. Patient due for lipid panel. Will recheck.      Relevant Orders   Lipid panel       Follow up plan: Return in about 4 months (around 05/08/2021).

## 2021-01-06 NOTE — Assessment & Plan Note (Signed)
Patient on Atorvastatin 80 mg and Zetia 10 mg. CBC and CMP completed Feb 2022. Patient due for lipid panel. Will recheck.

## 2021-01-06 NOTE — Assessment & Plan Note (Addendum)
Patient has now stopped smoking since recent left lower lobe lobectomy (Feb 2022). He is using 2-3L via [place] at night while at home. States he rarely uses albuterol rescue inhaler and the new prescription for Judithann Sauger is helping with COPD control. Patient is requesting refill on new inhaler and duoneb as he needs a breathing treatment every month or so. Refills entered.

## 2021-01-06 NOTE — Assessment & Plan Note (Signed)
Patient's blood pressure 134/58 today. No complaints of dizziness or syncope. Continue current regimen of carvedilol, clonidine, and Lasix. Follow-up in 4 months.

## 2021-01-07 LAB — LIPID PANEL
Cholesterol: 125 mg/dL (ref <200)
HDL: 36 mg/dL — ABNORMAL LOW (ref >=40)
LDL Cholesterol (Calc): 66 mg/dL (calc)
Non-HDL Cholesterol (Calc): 89 mg/dL (calc) (ref <130)
Total CHOL/HDL Ratio: 3.5 (calc) (ref <5.0)
Triglycerides: 157 mg/dL — ABNORMAL HIGH (ref <150)

## 2021-01-08 ENCOUNTER — Ambulatory Visit (INDEPENDENT_AMBULATORY_CARE_PROVIDER_SITE_OTHER): Payer: Medicare Other | Admitting: Podiatry

## 2021-01-08 ENCOUNTER — Other Ambulatory Visit: Payer: Self-pay

## 2021-01-08 ENCOUNTER — Encounter: Payer: Self-pay | Admitting: Podiatry

## 2021-01-08 ENCOUNTER — Ambulatory Visit: Payer: Medicare Other | Admitting: Podiatry

## 2021-01-08 DIAGNOSIS — M79674 Pain in right toe(s): Secondary | ICD-10-CM | POA: Insufficient documentation

## 2021-01-08 DIAGNOSIS — L84 Corns and callosities: Secondary | ICD-10-CM | POA: Insufficient documentation

## 2021-01-08 DIAGNOSIS — B351 Tinea unguium: Secondary | ICD-10-CM | POA: Diagnosis not present

## 2021-01-08 DIAGNOSIS — M79675 Pain in left toe(s): Secondary | ICD-10-CM | POA: Diagnosis not present

## 2021-01-08 DIAGNOSIS — D689 Coagulation defect, unspecified: Secondary | ICD-10-CM | POA: Diagnosis not present

## 2021-01-08 NOTE — Progress Notes (Signed)
This patient returns to my office for at risk foot care.  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  This patient is unable to cut nails himself since the patient cannot reach his nails.These nails are painful walking and wearing shoes. Patient also has painful callus both feet. This patient presents for at risk foot care today.  General Appearance  Alert, conversant and in no acute stress.  Vascular  Dorsalis pedis and posterior tibial  pulses are palpable  bilaterally.  Capillary return is within normal limits  bilaterally. Temperature is within normal limits  bilaterally.  Neurologic  Senn-Weinstein monofilament wire test diminished  bilaterally. Muscle power within normal limits bilaterally.  Nails Thick disfigured discolored nails with subungual debris  from hallux to fifth toes bilaterally. No evidence of bacterial infection or drainage bilaterally.  Orthopedic  No limitations of motion  feet .  No crepitus or effusions noted.  No bony pathology .  Hammer toes  B/L.  Skin  normotropic skin with no porokeratosis noted bilaterally.  No signs of infections or ulcers noted.     Onychomycosis  Pain in right toes  Pain in left toes  Consent was obtained for treatment procedures.   Mechanical debridement of nails 1-5  bilaterally performed with a nail nipper.  Filed with dremel without incident. Patient interested in diabetic shoes.  Needs to check with his  MD.   Return office visit    3 months                  Told patient to return for periodic foot care and evaluation due to potential at risk complications.   Gardiner Barefoot DPM

## 2021-01-09 ENCOUNTER — Encounter: Payer: Self-pay | Admitting: Physician Assistant

## 2021-01-09 ENCOUNTER — Ambulatory Visit: Payer: Medicare Other | Admitting: Physician Assistant

## 2021-01-09 VITALS — BP 124/60 | HR 76 | Ht 69.0 in | Wt 203.0 lb

## 2021-01-09 DIAGNOSIS — I10 Essential (primary) hypertension: Secondary | ICD-10-CM | POA: Diagnosis not present

## 2021-01-09 DIAGNOSIS — Z79899 Other long term (current) drug therapy: Secondary | ICD-10-CM | POA: Diagnosis not present

## 2021-01-09 DIAGNOSIS — E785 Hyperlipidemia, unspecified: Secondary | ICD-10-CM

## 2021-01-09 DIAGNOSIS — I5032 Chronic diastolic (congestive) heart failure: Secondary | ICD-10-CM | POA: Diagnosis not present

## 2021-01-09 DIAGNOSIS — Z8639 Personal history of other endocrine, nutritional and metabolic disease: Secondary | ICD-10-CM

## 2021-01-09 DIAGNOSIS — Z9049 Acquired absence of other specified parts of digestive tract: Secondary | ICD-10-CM

## 2021-01-09 DIAGNOSIS — I700 Atherosclerosis of aorta: Secondary | ICD-10-CM

## 2021-01-09 DIAGNOSIS — Z87891 Personal history of nicotine dependence: Secondary | ICD-10-CM

## 2021-01-09 DIAGNOSIS — Z8546 Personal history of malignant neoplasm of prostate: Secondary | ICD-10-CM

## 2021-01-09 DIAGNOSIS — Z90410 Acquired total absence of pancreas: Secondary | ICD-10-CM

## 2021-01-09 DIAGNOSIS — G4730 Sleep apnea, unspecified: Secondary | ICD-10-CM

## 2021-01-09 DIAGNOSIS — Z952 Presence of prosthetic heart valve: Secondary | ICD-10-CM

## 2021-01-09 DIAGNOSIS — J449 Chronic obstructive pulmonary disease, unspecified: Secondary | ICD-10-CM

## 2021-01-09 DIAGNOSIS — Z85118 Personal history of other malignant neoplasm of bronchus and lung: Secondary | ICD-10-CM

## 2021-01-09 DIAGNOSIS — K219 Gastro-esophageal reflux disease without esophagitis: Secondary | ICD-10-CM

## 2021-01-09 DIAGNOSIS — I739 Peripheral vascular disease, unspecified: Secondary | ICD-10-CM

## 2021-01-09 NOTE — Patient Instructions (Addendum)
Medication Instructions:  Your physician has recommended you make the following change in your medication:   STOP Plavix  *If you need a refill on your cardiac medications before your next appointment, please call your pharmacy*   Lab Work: None ordered  Testing/Procedures: None   Follow-Up: At [facility], you and your health needs are our priority.  As part of our continuing mission to provide you with exceptional heart care, we have created designated Provider Care Teams.  These Care Teams include your primary Cardiologist (physician) and Advanced Practice Providers (APPs -  Physician Assistants and Nurse Practitioners) who all work together to provide you with the care you need, when you need it.  We recommend signing up for the patient portal called "MyChart".  Sign up information is provided on this After Visit Summary.  MyChart is used to connect with patients for Virtual Visits (Telemedicine).  Patients are able to view lab/test results, encounter notes, upcoming appointments, etc.  Non-urgent messages can be sent to your provider as well.   To learn more about what you can do with MyChart, go to [email or web address].    Your next appointment:   6 month(s)  The format for your next appointment:   In Person  Provider:   You may see Ida Rogue, MD or one of the following Advanced Practice Providers on your designated Care Team:    Murray Hodgkins, NP  Christell Faith, PA-C  Marrianne Mood, PA-C  [place], [place]  Laurann Montana, NP

## 2021-01-09 NOTE — Progress Notes (Addendum)
Office Visit    Patient Name: Nathaniel Little Date of Encounter: 01/09/2021  Primary Care Provider:  Verl Bangs, FNP Primary Cardiologist:  Ida Rogue, MD  Chief Complaint    Chief Complaint  Patient presents with  . Follow-up    3 months     75 yo male with history of HFpEF, severe AS s/p TAVR 04/01/2020, aortic atherosclerosis, HTN, HLD, PAD and s/p stent to GDA and hepatic arteries after common and proper hepatic arteries were found dissected and occluded 07/2018, COPD, current tobacco use, obesity, sleep apnea on CPAP, prostate CA s/p [place], h/o pancreatic mass / IPMN s/p Whipple procedure 07/20/2018, GERD, and here today for preoperative cardiac evaluation prior to L lobectomy for 11/17/20.  Past Medical History    Past Medical History:  Diagnosis Date  . (HFpEF) heart failure with preserved ejection fraction ([place])    a. 08/2017 Echo: EF 60-65%, Gr1 DD.  [name] Arthritis   . COPD (chronic obstructive pulmonary disease) ([place])   . Coronary artery disease 2021  . Depression   . Diabetes mellitus without complication ([place])   . Hypertension   . Incidental pulmonary nodule, greater than or equal to 49m 03/11/2020   Left lower lobe - suspicious for bronchogenic neoplasm  . Lymphedema   . Morbid obesity ([place]   . [place]   ? PANCREATIC  . 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 Severe aortic stenosis   . Sleep apnea with use of continuous positive airway pressure (CPAP)   . Tobacco abuse   . Tremor    HEAD   Past Surgical History:  Procedure Laterality Date  . ADENOIDECTOMY    . APPENDECTOMY    . CARDIAC CATHETERIZATION    . CATARACT EXTRACTION W/PHACO Left 11/28/2018   Procedure: CATARACT EXTRACTION PHACO AND INTRAOCULAR LENS PLACEMENT (IOC) LEFT, DIABETIC;  Surgeon: PBirder Robson MD;  Location: [facility];   Service: Ophthalmology;  Laterality: Left;  [place] 00:56 CDE 9.44 Fluid pack lot # [id number]  . EUS N/A 10/13/2017   Procedure: FULL UPPER ENDOSCOPIC ULTRASOUND (EUS) RADIAL;  Surgeon: [name];  [facility];  Service: Gastroenterology;  Laterality: N/A;  . FLEXIBLE BRONCHOSCOPY N/A 11/19/2020   Procedure: FLEXIBLE BRONCHOSCOPY;  Surgeon: HMelrose Nakayama MD;  [place]  Service: Thoracic;  Laterality: N/A;  . INTERCOSTAL NERVE BLOCK Left 11/17/2020   Procedure: INTERCOSTAL NERVE BLOCK;  Surgeon: HMelrose Nakayama MD;  [place]  Service: Thoracic;  Laterality: Left;  . NODE DISSECTION Left 11/17/2020   Procedure: NODE DISSECTION;  Surgeon: HMelrose Nakayama MD;  [place] [facility]  Service: Thoracic;  Laterality: Left;  . PANCREATICODUODENECTOMY  07/23/2018  . RIGHT/LEFT HEART CATH AND CORONARY ANGIOGRAPHY N/A 02/21/2020   Procedure: RIGHT/LEFT HEART CATH AND CORONARY ANGIOGRAPHY;  Surgeon: GMinna Merritts MD;  [facility];  Service: Cardiovascular;  Laterality: N/A;  . TEE WITHOUT CARDIOVERSION N/A 04/01/2020   Procedure: TRANSESOPHAGEAL ECHOCARDIOGRAM (TEE);  Surgeon: MBurnell Blanks MD;  [facility];  Service: Open Heart Surgery;  Laterality: N/A;  . TONSILLECTOMY    . TRANSCATHETER AORTIC VALVE REPLACEMENT, TRANSFEMORAL N/A 04/01/2020   Procedure: TRANSCATHETER AORTIC VALVE REPLACEMENT, TRANSFEMORAL;  Surgeon: MBurnell Blanks MD;  [facility];  Service: Open Heart  Surgery;  Laterality: N/A;  . wipple      Allergies  Allergies  Allergen Reactions  . Codeine Itching    History of Present Illness    Nathaniel Little is a 75 y.o. male with PMH as above.   He was evaluated in 2018 for intermittent exertional CP and DOE with stress testing without evidence of ischemia.   He was seen by his primary cardiologist 01/19/2019, at which time he was reportedly doing well. He  reported chronic leg pain that sometimes occurred while he slept and sometimes during the day. He was SOB at times with use of inhalers and smoking 2 cigarettes daily. He was able to reportedly walk 18 blocks without issue.   He underwent Whipple procedure 07/2018 by [facility]. Review of EMR notes he presented 07/20/2018 for Whipple for IPMN. Robotic whipple was performed. In the postoperative period, however, he had hematemesis 10/26 concerning for GIB. The common and proper hepatic arteries were found to be dissected and occluded; however, it was also noted that the patient had robust portal flow to the liver. Vascular surgery was consulted and the patient taken to the OR with VIR where a stent was placed in his GDA and hepatic arteries. He was started on Plavix.  Follow-up echo 02/2020 showed significant progression of severe aortic stenosis with preserved LVSF. He was referred to the structural heart team at that time.   Shortly thereafter, he was hospitalized at [facility] with abdominal pain, elevated WBC, and mild lactic acidosis. CT scan of abdomen and pelvis revealed considerable peripancreatic and duodenal inflammatory change, consistent with acute pancreatitis or duodenitis. He was treated with Cipro and Flagyl with resolution of symptoms.   He underwent [facility] 02/21/2020 that revealed nonobstructive CAD with normal LVSF and severe aortic stenosis. There is mild elevation of right heart pressures.  Carotid US study showed mild bilateral carotid dz.  He was seen in consultation by Dr. Angelena Form. CTA performed and revealed a small pulmonary nodule in the left lower lobe, suspicious for bronchogenic neoplasm. Follow-up PET scan 03/2020 showed mild hypermetabolism, raising concern for primary bronchogenic neoplasm. There were no findings suspicious for metastatic disease. There was also a 15 mm hypermetabolic lesion in the left posterior parotid gland, suggesting benign or malignant parotid neoplasm, possibly  Warthin's tumor.  He was evaluated by the structural heart team and underwent successful TAVR 04/01/2020.   Postop EF 60%, normally functioning TAVR with mean gradient 14 mmHg and trivial PVL. He was discharged on ASA and Plavix.   He was last seen 05/01/2020 and reported increased energy and improvement in breathing. SBE prophylaxis discussed. He was continued on ASA and Plavix. It was noted Plavix could be discontinued after 6 months of therapy. Recommendation was for follow-up echo in 1 year.   Referral was made to multidisciplinary thoracic cancer team.  Complete cessation of tobacco recommended.  He has been scheduled for L lower lobectomy on 11/17/20.  He was last seen in clinic 11/07/2018 and before for his left-sided lobectomy 11/17/2020.  He noted good days and bad days.  He had productive cough with white sputum, night sweats, BRBPR, melena, and fatigue.  He was smoking 3 cigarettes/day.  He was cutting back on caffeine and reported 1 soda per month.  He was drinking 3 to 4 bottles of 12 ounce water per day.  He was using salt but agreeable to Mrs. Dash.  We discussed that [place] salt was also salt.  He reported BRBPR, possibly due to hemorrhoids, and  melena, possibly due to iron supplementation.  He noted occasional constipation.  He had adequate urine output, often peeing up to 15 times per day.  He reported bendopnea and intermittent bloating.  He had weight gain and was using 2 pillows each night.  He was monitoring his BP at home and on average 1 94-4 30 systolic with DBP 96-75.  On review of EMR, it was noted that Plavix could be discontinued with patient report that he had already stopped taking the medication.  TAVR team was contacted and confirmed okay to discontinue.  Today, 01/09/2021, he returns to clinic and notes that he has completely quit smoking.  He reports that he feels much improved after his left-sided lobectomy.  He tried to smoke after his procedure but was unable to do so  and has since stayed away from cigarettes.  His energy is good, and he has been able to go down on his home oxygen.  He no longer needs oxygen when out and about.  He reports drinking 3 to 416 ounce water bottles per day.  He is currently using 3 pillows, though some is due to comfort.  He reports a sharp pain on the left side since his procedure, which does not occur when laying on the right.  He reports his blood pressure about the same.  He is using Mrs. Dash every now and again, though he still likes his pink Himalayan salt.  He reports ongoing melena, possibly due to iron supplementation, as well as scant BRBPR after constipation.  No chest pain, racing heart rate, palpitations, presyncope, syncope, early satiety, abdominal distention, PND.  He reports getting some good news today, as he recently found out that an engine for his truck is available.  Home Medications    Prior to Admission medications   Medication Sig Start Date End Date Taking? Authorizing Provider  ACCU-CHEK AVIVA PLUS test strip  05/24/19  Yes [provider]  albuterol (PROVENTIL) (2.5 MG/3ML) 0.083% nebulizer solution Take 2.5 mg by nebulization every 6 (six) hours as needed for wheezing or shortness of breath.   Yes [provider]  albuterol (VENTOLIN HFA) 108 (90 Base) MCG/ACT inhaler Inhale 2 puffs into the lungs every 6 (six) hours as needed for wheezing or shortness of breath. 08/22/19  Yes Karamalegos, Devonne Doughty, DO  aspirin EC 81 MG tablet Take 81 mg by mouth daily.   Yes [provider]  blood glucose meter kit and supplies Dispense based on patient and insurance preference. Use up to four times daily as directed. (FOR ICD-10 E10.9, E11.9). 08/18/18  Yes [name], NP  cloNIDine (CATAPRES) 0.1 MG tablet TAKE 1 TABLET BY MOUTH TWICE DAILY 01/07/20  Yes [name], [name], FNP  clopidogrel (PLAVIX) 75 MG tablet TAKE 1 TABLET BY MOUTH ONCE DAILY 02/05/20  Yes [name], [name], FNP   diclofenac (VOLTAREN) 75 MG EC tablet TAKE 1 TABLET BY MOUTH TWICE DAILY WITH FOOD AFTER 14 DAYS TAKE ONLY AS NEEDED 01/08/20  Yes [name], [name], FNP  diclofenac Sodium (VOLTAREN) 1 % GEL Apply 2 g topically 4 (four) times daily. 10/14/19  Yes Johnn Hai, PA-C  Ferrous Sulfate (IRON SUPPLEMENT PO) Take by mouth.   Yes [provider]  gabapentin (NEURONTIN) 100 MG capsule Take 200 mg by mouth at bedtime.  01/29/19  Yes [provider]  hydrochlorothiazide (HYDRODIURIL) 12.5 MG tablet TAKE 1 TABLET BY MOUTH ONCE DAILY 02/05/20  Yes [name], [name], FNP  insulin glargine (  LANTUS) 100 unit/mL SOPN Inject 20 Units into the skin at bedtime.   Yes [provider]  ipratropium-albuterol (DUONEB) 0.5-2.5 (3) MG/3ML SOLN Take 3 mLs by nebulization every 6 (six) hours as needed (wheezing, shortness of breath). 04/20/17  Yes [name], NP  metFORMIN (GLUCOPHAGE) 500 MG tablet Take 1,000 mg by mouth 2 (two) times daily.  11/04/18  Yes [provider]  pantoprazole (PROTONIX) 40 MG tablet Take 1 tablet (40 mg total) by mouth 2 (two) times daily. 12/06/19 12/05/20 Yes [name], [name], FNP  SYMBICORT 80-4.5 MCG/ACT inhaler INHALE 2 PUFFS BY MOUTH TWICE DAILY 08/17/19  Yes Karamalegos, Devonne Doughty, DO  tiotropium (SPIRIVA HANDIHALER) 18 MCG inhalation capsule Place 1 capsule (18 mcg total) into inhaler and inhale daily. 11/20/18  Yes [name], NP  traZODone (DESYREL) 100 MG tablet Take 100 mg by mouth at bedtime as needed for sleep.   Yes [provider]  docusate sodium (COLACE) 100 MG capsule Take 100 mg by mouth daily as needed for mild constipation.    [provider]  sildenafil (REVATIO) 20 MG tablet Take 1-5 pills about 30 min prior to sex. Start with 1 and increase as needed. Patient not taking: Reported on 02/01/2020 08/13/19   Olin Hauser, DO    Review of Systems    He denies chest pain, palpitations, pnd, n, v,  dizziness, syncope, edema, weight gain, or early satiety.  He reports improving dyspnea, breathing, improved energy, using 3 pillows at least in part due to comfort, BRBPR in the setting of constipation, melena in the setting of iron supplementation all other systems reviewed and are otherwise negative except as noted above.  Physical Exam    VS:  BP 124/60   Pulse 76   Ht 5' 9" (1.753 m)   Wt 203 lb (92.1 kg)   BMI 29.98 kg/m  , BMI Body mass index is 29.98 kg/m. GEN: Well nourished, well developed, in no acute distress. HEENT: normal. Neck: Supple, no JVD, no carotid bruit. no masses. Cardiac: RRR, 1/6 systolic murmur, no rubs or gallops. No clubbing, cyanosis. No bilateral edema.  Radials/DP/PT 2+ and equal bilaterally.  Respiratory:  Respirations regular and unlabored, CTAB and absent on the left lower side GI: Soft, nontender, nondistended, BS + x 4. MS: no deformity or atrophy. Skin: warm and dry, no rash. Neuro:  Strength and sensation are intact. Psych: Normal affect.  Accessory Clinical Findings    ECG personally reviewed by me today -SR, 76 bpm, prior inferior infarct as shown in prior EKGs, cannot rule out anterior infarct, prolonged PR interval- no acute changes.  VITALS Reviewed today   Temp Readings from Last 3 Encounters:  01/06/21 97.7 F (36.5 C) (Temporal)  12/12/20 (!) 96.8 F (36 C) (Tympanic)  12/02/20 97.9 F (36.6 C) (Skin)   BP Readings from Last 3 Encounters:  01/09/21 124/60  01/06/21 (!) 134/58  12/23/20 (!) 150/80   Pulse Readings from Last 3 Encounters:  01/09/21 76  01/06/21 79  12/23/20 85    Wt Readings from Last 3 Encounters:  01/09/21 203 lb (92.1 kg)  01/06/21 204 lb 6.4 oz (92.7 kg)  12/23/20 203 lb (92.1 kg)     LABS  reviewed today    Lab Results  Component Value Date   WBC 8.1 11/23/2020   HGB 8.5 (L) 11/23/2020   HCT 25.3 (L) 11/23/2020   MCV 89.1 11/23/2020   PLT 156 11/23/2020   Lab Results  Component Value Date    CREATININE 1.20 11/20/2020   BUN 23 11/20/2020   NA 137 11/20/2020   K 3.9 11/20/2020   CL 104 11/20/2020   CO2 25 11/20/2020   Lab Results  Component Value Date   ALT 41 11/19/2020   AST 40 11/19/2020   ALKPHOS 99 11/19/2020   BILITOT 0.9 11/19/2020   Lab Results  Component Value Date   CHOL 125 01/06/2021   HDL 36 (L) 01/06/2021   LDLCALC 66 01/06/2021   TRIG 157 (H) 01/06/2021   CHOLHDL 3.5 01/06/2021    Lab Results  Component Value Date   HGBA1C 6.9 (H) 11/14/2020   Lab Results  Component Value Date   TSH 1.22 01/06/2017     STUDIES/PROCEDURES reviewed today   Echo 05/01/20 1. Left ventricular ejection fraction, by estimation, is 60 to 65%. The  left ventricle has normal function. The left ventricle has no regional  wall motion abnormalities. There is mild left ventricular hypertrophy.  Left ventricular diastolic parameters  are consistent with Grade I diastolic dysfunction (impaired relaxation).  2. Right ventricular systolic function is normal. The right ventricular  size is normal.  3. The mitral valve is normal in structure. Trivial mitral valve  regurgitation. No evidence of mitral stenosis.  4. Post TAVR 04/01/20 with 29 mm Sapien 3 valve. Gradients have increased  since day one post op 04/02/20 mean 14 mm -> 16 mmHg , Peak 28 mm _> 31  mmHg There is a mild PVL that is more apparent than day one post op echo .  The aortic valve has been  repaired/replaced. Aortic valve regurgitation is mild. No aortic stenosis  is present. Procedure Date: 04/01/2020.  5. The inferior vena cava is normal in size with greater than 50%  respiratory variability, suggesting right atrial pressure of 3 mmHg.   Carotid US 03/11/20 Summary:  Right Carotid: Velocities in the right ICA are consistent with a 1-39%  stenosis.  Left Carotid: Velocities in the left ICA are consistent with a 1-39%  stenosis.  Vertebrals: Bilateral vertebral arteries demonstrate antegrade  flow.  Subclavians: Normal flow hemodynamics were seen in bilateral subclavian        arteries.   [facility] 02/21/20  The left ventricular ejection fraction is 55-65% by visual estimate.  LV end diastolic pressure is normal.  The left ventricular systolic function is normal.  There is severe aortic valve stenosis.  Assessment & Plan   Chronic HFpEF --Reports significantly improved SOB/DOE with NYHA class I symptoms.  He no longer requires oxygen when outside the house and reports reduced oxygen at home.  He is s/p lobectomy for lung cancer, which has significantly improved his breathing status.  Most recent echo as above 04/2020 with EF nl.  Euvolemic and well compensated on exam.  His weight is down from previous clinic visits and BP well controlled.  Reviewed limiting volume/fluid intake and sodium again today.  Avoid pink Himalayan salt.  Continue current medications.  Per TAVR team, repeat echo 04/2021  Essential HTN, BP goal 130/80 or lower --BP today controlled.  He continues to monitor his BP at home with pressures similar to that of the office.  Continue current medications.  Reviewed salt and fluid intake as above.  Continue current medications.  Reviewed heart healthy diet/lifestyle changes  S/p TAVR for Severe Aortic stenosis --S/p 03/2020 TAVR procedure with postoperative echo findings showing no paravalvular leak and normal LV SF.  Most recent 04/2020 echo as above with  EF 60 to 65% mild AR.  SBE prophylaxis, though he has no teeth and does not go to the dentist.  Per TAVR team, okay to discontinue Plavix.  He will need a repeat echo 04/2021.  Heart rate, BP, cholesterol control recommended.  HLD, goal LDL below 70 --Continue atorvastatin 74m daily and Zetia for risk factor modification.  PAD / Mild carotid dz Stenting to GDA and hepatic arteries --As above, he is s/p stenting after 07/29/18 hematemesis and discovered common and proper hepatic arteries found to be dissected  and occluded.  Continue PAD/hepatic/carotid follow-up per vein and vascular surgery.  Continue ASA, statin.  Recommend heart rate, BP, cholesterol, and glycemic control.  Melena, BRBPR  --Reports ongoing melena in the setting of iron supplementation and BRBPR in the setting of constipation.  He reports his BRBPR is scant.  He does intend to keep a close eye on this and will let me know if this changes.  Previous CBC after report of melena and BRBPR stable.  Continue to monitor per PCP.  DM2 --Glycemic control recommended for ongoing risk factor modificaton.   Previous tobacco use, COPD --Congratulated on quitting smoking since his previous clinic visit.  Ongoing cessation recommended.  Sleep apnea on CPAP --Continued CPAP use encouraged.   Disposition: RTC 3 to 6 months.  Ensure follow-up echo 04/2021 for TAVR team.  JArvil Chaco PA-C 01/09/2021

## 2021-01-14 ENCOUNTER — Telehealth: Payer: Self-pay | Admitting: *Deleted

## 2021-01-14 MED ORDER — BREZTRI AEROSPHERE 160-9-4.8 MCG/ACT IN AERO: 2.0000 | INHALATION_SPRAY | Freq: Two times a day (BID) | RESPIRATORY_TRACT | 12 refills | Status: DC

## 2021-01-14 NOTE — Telephone Encounter (Signed)
I called and spoke with the patient. He informed me that the pharmacy never received the prescription for the [facility]. Prescription resubmitted to [facility].

## 2021-01-14 NOTE — Telephone Encounter (Signed)
Pt states pharmacy does not have refill of [place]son-Glycoryrrol-Formoterol as is entered Order Class 'Sample.' Ordered 01/06/21 Please review. Thank you.

## 2021-01-19 LAB — ACID FAST CULTURE WITH REFLEXED SENSITIVITIES (MYCOBACTERIA): Acid Fast Culture: NEGATIVE

## 2021-01-20 ENCOUNTER — Other Ambulatory Visit: Payer: Self-pay

## 2021-01-20 ENCOUNTER — Ambulatory Visit: Payer: Medicare Other | Admitting: Physician Assistant

## 2021-01-20 ENCOUNTER — Encounter: Payer: Self-pay | Admitting: Pulmonary Disease

## 2021-01-20 ENCOUNTER — Other Ambulatory Visit: Payer: Self-pay | Admitting: Family Medicine

## 2021-01-20 VITALS — BP 141/75 | HR 90 | Temp 98.2°F | Resp 18 | Ht 69.0 in | Wt 201.0 lb

## 2021-01-20 DIAGNOSIS — G4700 Insomnia, unspecified: Secondary | ICD-10-CM | POA: Diagnosis not present

## 2021-01-20 DIAGNOSIS — R195 Other fecal abnormalities: Secondary | ICD-10-CM

## 2021-01-20 DIAGNOSIS — E1142 Type 2 diabetes mellitus with diabetic polyneuropathy: Secondary | ICD-10-CM

## 2021-01-20 DIAGNOSIS — D509 Iron deficiency anemia, unspecified: Secondary | ICD-10-CM | POA: Insufficient documentation

## 2021-01-20 DIAGNOSIS — C3432 Malignant neoplasm of lower lobe, left bronchus or lung: Secondary | ICD-10-CM

## 2021-01-20 DIAGNOSIS — B370 Candidal stomatitis: Secondary | ICD-10-CM | POA: Insufficient documentation

## 2021-01-20 DIAGNOSIS — K5909 Other constipation: Secondary | ICD-10-CM | POA: Insufficient documentation

## 2021-01-20 DIAGNOSIS — E782 Mixed hyperlipidemia: Secondary | ICD-10-CM

## 2021-01-20 MED ORDER — NYSTATIN 100000 UNIT/ML MT SUSP: 5.0000 mL | Freq: Four times a day (QID) | OROMUCOSAL | 0 refills | Status: DC

## 2021-01-20 NOTE — Progress Notes (Signed)
Patient has not eaten today and patient has eaten today. Patient complains of stomach pain with eating for the past 2 months.

## 2021-01-20 NOTE — Telephone Encounter (Signed)
Pharmacist from [facility] wanted to follow up on this request.  Caller asked if Budeson-Glycopyrrol-Formoterol (BREZTRI AEROSPHERE) 160-9-4.8 MCG/ACT AERO  Could be re-sent, stated he did not receive the script for this prescription.  Please advise if you can re-send this.

## 2021-01-20 NOTE — Telephone Encounter (Signed)
Copied from [place] [id number]. Topic: Quick Communication - Rx Refill/Question >> Jan 20, 2021  4:16 PM Leward Quan A wrote: Medication: Budeson-Glycopyrrol-Formoterol (BREZTRI AEROSPHERE) 160-9-4.8 MCG/ACT AERO   Has the patient contacted their pharmacy? Yes.   (Agent: If no, request that the patient contact the pharmacy for the refill.) (Agent: If yes, when and what did the pharmacy advise?)  Preferred Pharmacy (with phone number or street name): [facility] [place], [address].  Phone:  [phone] Fax:  [phone]     Agent: Please be advised that RX refills may take up to 3 business days. We ask that you follow-up with your pharmacy.

## 2021-01-20 NOTE — Patient Instructions (Addendum)
For your dry mouth, you will use a nystatin rinse, continue to stay well-hydrated as well.  I do encourage you to resume your blood pressure and cholesterol medications.  I do encourage you to stop napping during the day so you can have a better sleep cycle.  We will call you with your lab results and let you know about your iron supplements.  Please let us know if there is anything else we can do for you.  Kennieth Rad, PA-C Physician Assistant [facility] [email or web address]    Oral Thrush, Adult Oral thrush, also called oral candidiasis, is a fungal infection that develops in the mouth and throat and on the tongue. It causes white patches to form in the mouth and on the tongue. Many cases of thrush are mild, but this infection can also be serious. Nathaniel Little can be a repeated (recurrent) problem for certain people who have a weak body defense system (immune system). The weakness can be caused by chronic illnesses, or by taking medicines that limit the body's ability to fight infection. If a person has difficulty fighting infection, the fungus that causes thrush can spread through the body. This can cause life-threatening blood or organ infections. What are the causes? This condition is caused by a fungus (yeast) called Candida albicans.  This fungus is normally present in small amounts in the mouth and on other mucous membranes. It usually causes no harm.  If conditions are present that allow the fungus to grow without control, it invades surrounding tissues and becomes an infection.  Other Candida species can also lead to thrush, though this is rare. What increases the risk? The following factors may make you more likely to develop this condition:  Having a weakened immune system.  Being an older adult.  Having diabetes, cancer, or HIV (human immunodeficiency virus).  Having dry mouth (xerostomia).  Being pregnant or  breastfeeding.  Having poor dental care, especially in those who have dentures.  Using antibiotic or steroid medicines. What are the signs or symptoms? Symptoms of this condition can vary from mild and moderate to severe and persistent. Symptoms may include:  A burning feeling in the mouth and throat. This can occur at the start of a thrush infection.  White patches that stick to the mouth and tongue. The tissue around the patches may be red, raw, and painful. If rubbed (during tooth brushing, for example), the patches and the tissue of the mouth may bleed easily.  A bad taste in the mouth or difficulty tasting foods.  A cottony feeling in the mouth.  Pain during eating and swallowing.  Poor appetite.  Cracking at the corners of the mouth.   How is this diagnosed? This condition is diagnosed based on:  A physical exam.  Your medical history. How is this treated? This condition is treated with medicines called antifungals, which prevent the growth of fungi. These medicines are either applied directly to the affected area (topical) or swallowed (oral). The treatment will depend on the severity of the condition.  Mild cases of thrush may be treated with an antifungal mouth rinse or lozenges. Treatment usually lasts about 14 days.  Moderate to severe cases of thrush can be treated with oral antifungal medicine, if they have spread to the esophagus. A topical antifungal medicine may also be used. For some severe infections, treatment may need to continue for more than 14 days. ? Oral antifungal medicines are rarely used during pregnancy because they may be harmful  to the unborn child. If you are pregnant, talk with your health care provider about options for treatment.  Persistent or recurrent thrush. For cases of thrush that do not go away or keep coming back: ? Treatment may be needed twice as long as the symptoms last. ? Treatment will include both oral and topical antifungal  medicines. ? People with a weakened immune system can take an antifungal medicine on a continuous basis to prevent thrush infections. It is important to treat conditions that make a person more likely to get thrush, such as diabetes or HIV. Follow these instructions at home: Medicines  Take or use over-the-counter and prescription medicines only as told by your health care provider.  Talk with your health care provider about an over-the-counter medicine called gentian violet, which kills bacteria and fungi. Relieving soreness and discomfort To help reduce the discomfort of thrush:  Drink cold liquids such as water or iced tea.  Try flavored ice treats or frozen juices.  Eat foods that are easy to swallow, such as gelatin, ice cream, or custard.  Try drinking from a straw if the patches in your mouth are painful.   General instructions  Eat plain, unflavored yogurt as directed by your health care provider. Check the label to make sure the yogurt contains live cultures. This yogurt can help healthy bacteria grow in the mouth and can stop the growth of the fungus that causes thrush.  If you wear dentures, remove the dentures before going to bed, brush them vigorously, and soak them in a cleaning solution as directed by your health care provider.  Rinse your mouth with a warm salt-water mixture several times a day. To make a salt-water mixture, dissolve -1 tsp (3-6 g) of salt in 1 cup (237 mL) of warm water. Contact a health care provider if:  Your symptoms are getting worse or are not improving within 7 days of starting treatment.  You have symptoms of a spreading infection, such as white patches on the skin outside of the mouth.  You are breastfeeding your baby and you have redness and pain in the nipples. Summary  Oral thrush, also called oral candidiasis, is a fungal infection that develops in the mouth and throat and on the tongue. It causes white patches to form in the mouth and  on the tongue.  You are more likely to get this condition if you have a weakened immune system or an underlying condition, such as HIV, cancer, or diabetes.  This condition is treated with medicines called antifungals, which prevent the growth of fungi.  Contact a health care provider if your symptoms do not improve, or get worse, within 7 days of starting treatment. This information is not intended to replace advice given to you by your health care provider. Make sure you discuss any questions you have with your health care provider. Document Revised: 07/27/2019 Document Reviewed: 07/27/2019 Elsevier Patient Education  [employer].

## 2021-01-20 NOTE — Progress Notes (Signed)
New Patient Office Visit  Subjective:  Patient ID: Nathaniel Little, male    DOB: 1946-05-12  Age: 75 y.o. MRN: [id number]  CC:  Chief Complaint  Patient presents with  . Constipation    HPI Nathaniel Little presents with several complaints.  Complains of constipation, dry mouth, difficulty sleeping and medication compliance.  Reports that he has been having constipation and stomach cramping for the past couple of months.  Reports that he does have to strain with bowel movements, states that he has been taking over-the-counter stool softeners every other day with little relief.  Denies blood on tissue or blood in stool.  Does endorse that he was recently started on iron, states this started soon after taking the iron tablets.  States that he eats a good diet, drinks plenty of water.  Reports that he has been having a constantly dry mouth, occasionally will feel burning.  Reports that he tries to drink a lot of water, but this does not offer much relief.  Does endorse that he wears oxygen at night.  Reports that he has been having difficulty sleeping, states that he was prescribed trazodone, was taking 100 mg, states it is no longer offering.  Does endorse that he does nap frequently during the day.  Reports that he is only taking his metformin and insulin and iron pills.  Reports that he is not taking any of the other medications that have been prescribed to him, states that he is working on "weaning himself off of medications".   Past Medical History:  Diagnosis Date  . (HFpEF) heart failure with preserved ejection fraction ([place])    a. 08/2017 Echo: EF 60-65%, Gr1 DD.  [name] Arthritis   . COPD (chronic obstructive pulmonary disease) ([place])   . Coronary artery disease 2021  . Depression   . Diabetes mellitus without complication ([place])   . Hypertension   . Incidental pulmonary nodule, greater than or equal to 37m 03/11/2020   Left lower lobe - suspicious for bronchogenic neoplasm  .  Lymphedema   . Morbid obesity ([place]   . [place] 03[place]   ? PANCREATIC  . 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 Severe aortic stenosis   . Sleep apnea with use of continuous positive airway pressure (CPAP)   . Tobacco abuse   . Tremor    HEAD    Past Surgical History:  Procedure Laterality Date  . ADENOIDECTOMY    . APPENDECTOMY    . CARDIAC CATHETERIZATION    . CATARACT EXTRACTION W/PHACO Left 11/28/2018   Procedure: CATARACT EXTRACTION PHACO AND INTRAOCULAR LENS PLACEMENT (IOC) LEFT, DIABETIC;  Surgeon: PBirder Robson MD;  Location: [facility] ORS;  Service: Ophthalmology;  Laterality: Left;  [place] 00:56 CDE 9.44 Fluid pack lot # [id number]  . EUS N/A 10/13/2017   Procedure: FULL UPPER ENDOSCOPIC ULTRASOUND (EUS) RADIAL;  Surgeon: [name];  [facility];  Service: Gastroenterology;  Laterality: N/A;  . FLEXIBLE BRONCHOSCOPY N/A 11/19/2020   Procedure: FLEXIBLE BRONCHOSCOPY;  Surgeon: HMelrose Nakayama MD;  [facility]  Service: Thoracic;  Laterality: N/A;  . INTERCOSTAL NERVE BLOCK Left 11/17/2020   Procedure: INTERCOSTAL NERVE BLOCK;  Surgeon: HMelrose Nakayama MD;  [facility]  Service: Thoracic;  Laterality: Left;  . NODE DISSECTION Left 11/17/2020   Procedure: NODE DISSECTION;  Surgeon: HRoxan Hockey  Revonda Standard, MD;  Location: [facility];  Service: Thoracic;  Laterality: Left;  . PANCREATICODUODENECTOMY  07/23/2018  . RIGHT/LEFT HEART CATH AND CORONARY ANGIOGRAPHY N/A 02/21/2020   Procedure: RIGHT/LEFT HEART CATH AND CORONARY ANGIOGRAPHY;  Surgeon: Minna Merritts, MD;  Location: [facility];  Service: Cardiovascular;  Laterality: N/A;  . TEE WITHOUT CARDIOVERSION N/A 04/01/2020   Procedure: TRANSESOPHAGEAL ECHOCARDIOGRAM (TEE);  Surgeon: Burnell Blanks, MD;  Location: [facility];  Service: Open  Heart Surgery;  Laterality: N/A;  . TONSILLECTOMY    . TRANSCATHETER AORTIC VALVE REPLACEMENT, TRANSFEMORAL N/A 04/01/2020   Procedure: TRANSCATHETER AORTIC VALVE REPLACEMENT, TRANSFEMORAL;  Surgeon: Burnell Blanks, MD;  Location: [place] [facility];  Service: Open Heart Surgery;  Laterality: N/A;  . wipple      Family History  Problem Relation Age of Onset  . Breast cancer Mother   . Cancer Father        Black Lung  . Cerebral palsy Brother   . Tuberculosis Paternal Uncle   . Prostate cancer Neg Hx   . Kidney cancer Neg Hx   . Bladder Cancer Neg Hx     Social History   Socioeconomic History  . Marital status: Widowed    Spouse name: Not on file  . Number of children: 2  . Years of education: Not on file  . Highest education level: Not on file  Occupational History  . Occupation: retired Social research officer, government  Tobacco Use  . Smoking status: Former Smoker    Packs/day: 4.00    Years: 68.00    Pack years: 272.00    Types: Cigarettes  . Smokeless tobacco: Former Systems developer    Types: Chew, [place] date: 2000  . Tobacco comment: quit smoking 11/17/2020  Vaping Use  . Vaping Use: Never used  Substance and Sexual Activity  . Alcohol use: Not Currently    Alcohol/week: 0.0 - 1.0 standard drinks    Comment: quit 40years ago  . Drug use: No  . Sexual activity: Not Currently  Other Topics Concern  . Not on file  Social History Narrative   Living at [place] Strain: Low Risk   . Difficulty of Paying Living Expenses: Not hard at all  Food Insecurity: No Food Insecurity  . Worried About Charity fundraiser in the Last Year: Never true  . Ran Out of Food in the Last Year: Never true  Transportation Needs: No Transportation Needs  . Lack of Transportation (Medical): No  . Lack of Transportation (Non-Medical): No  Physical Activity: Inactive  . Days of Exercise per Week: 0 days  . Minutes of Exercise per  Session: 0 min  Stress: No Stress Concern Present  . Feeling of Stress : Not at all  Social Connections: Moderately Isolated  . Frequency of Communication with Friends and Family: More than three times a week  . Frequency of Social Gatherings with Friends and Family: More than three times a week  . Attends Religious Services: More than 4 times per year  . Active Member of Clubs or Organizations: No  . Attends Archivist Meetings: Never  . Marital Status: Widowed  Intimate Partner Violence: Not At Risk  . Fear of Current or Ex-Partner: No  . Emotionally Abused: No  . Physically Abused: No  . Sexually Abused: No    ROS Review of Systems  Constitutional: Positive for  fatigue.  HENT: Negative for sore throat and trouble swallowing.   Eyes: Negative.   Respiratory: Negative for shortness of breath.   Cardiovascular: Negative for chest pain.  Gastrointestinal: Positive for abdominal pain and constipation. Negative for blood in stool.  Endocrine: Negative.   Genitourinary: Negative.   Musculoskeletal: Negative.   Skin: Negative.   Allergic/Immunologic: Negative.   Neurological: Negative.   Hematological: Negative.   Psychiatric/Behavioral: Negative.     Objective:   Today's Vitals: BP (!) 141/75 (BP Location: Left Arm, Patient Position: Sitting, Cuff Size: Large)   Pulse 90   Temp 98.2 F (36.8 C) (Oral)   Resp 18   Ht _0  (1.753 m)   Wt 201 lb (91.2 kg)   SpO2 94%   BMI 29.68 kg/m   Physical Exam Vitals and nursing note reviewed.  Constitutional:      General: He is not in acute distress.    Appearance: Normal appearance. He is not ill-appearing.  HENT:     Head: Normocephalic and atraumatic.     Right Ear: External ear normal.     Left Ear: External ear normal.     Nose: Nose normal.     Mouth/Throat:     Mouth: Mucous membranes are dry.     Dentition: Abnormal dentition.     Comments: White patches noted on tongue Eyes:     Extraocular Movements:  Extraocular movements intact.     Conjunctiva/sclera: Conjunctivae normal.     Pupils: Pupils are equal, round, and reactive to light.  Cardiovascular:     Rate and Rhythm: Normal rate and regular rhythm.     Pulses: Normal pulses.     Heart sounds: Normal heart sounds.  Pulmonary:     Effort: Pulmonary effort is normal.     Breath sounds: Normal breath sounds.  Musculoskeletal:        General: Normal range of motion.     Cervical back: Normal range of motion and neck supple.  Skin:    General: Skin is warm and dry.  Neurological:     General: No focal deficit present.     Mental Status: He is alert and oriented to person, place, and time.  Psychiatric:        Mood and Affect: Mood normal.        Behavior: Behavior normal.        Thought Content: Thought content normal.        Judgment: Judgment normal.     Assessment & Plan:   Problem List Items Addressed This Visit      Respiratory   Malignant neoplasm of lower lobe of left lung (HCC)     Endocrine   DM type 2 with diabetic peripheral neuropathy ([place])   Relevant Orders   Comp. Metabolic Panel (12)     Other   Hyperlipidemia   Dark stools    Other Visit Diagnoses    Other constipation    -  Primary   Iron deficiency anemia, unspecified iron deficiency anemia type       Relevant Orders   CBC with Differential/Platelet   Iron, TIBC and Ferritin Panel   Insomnia, unspecified type       Thrush       Relevant Medications   nystatin (MYCOSTATIN) 100000 UNIT/ML suspension      Outpatient Encounter Medications as of 01/20/2021  Medication Sig  . albuterol (VENTOLIN HFA) 108 (90 Base) MCG/ACT inhaler Inhale 2 puffs into the lungs every 6 (  six) hours as needed for wheezing or shortness of breath.  [name] aspirin EC 81 MG tablet Take 81 mg by mouth daily.  [name] atorvastatin (LIPITOR) 80 MG tablet Take 80 mg by mouth daily.   . Budeson-Glycopyrrol-Formoterol (BREZTRI AEROSPHERE) 160-9-4.8 MCG/ACT AERO Inhale 2 puffs into the  lungs 2 (two) times daily.  . carvedilol (COREG) 6.25 MG tablet Take 1 tablet (6.25 mg total) by mouth 2 (two) times daily with a meal.  . cloNIDine (CATAPRES) 0.1 MG tablet Take 0.1 mg by mouth 2 (two) times daily.  . diclofenac Sodium (VOLTAREN) 1 % GEL Apply 1 application topically 3 (three) times daily as needed (pain).  [name] ezetimibe (ZETIA) 10 MG tablet Take 1 tablet (10 mg total) by mouth daily.  . ferrous [id number] C-folic acid (TRINSICON / FOLTRIN) capsule Take 1 capsule by mouth 2 (two) times daily after a meal.  . fluticasone (FLONASE) 50 MCG/ACT nasal spray Place 1 spray into both nostrils daily as needed for allergies.  [name] gabapentin (NEURONTIN) 100 MG capsule Take 1-2 capsules (100-200 mg total) by mouth at bedtime. (Patient taking differently: Take 200 mg by mouth at bedtime.)  . insulin glargine (LANTUS) 100 unit/mL SOPN Inject 20 Units into the skin at bedtime.  [name] ipratropium-albuterol (DUONEB) 0.5-2.5 (3) MG/3ML SOLN Take 3 mLs by nebulization every 6 (six) hours as needed (Asthma).  . metFORMIN (GLUCOPHAGE) 500 MG tablet Take 1,000 mg by mouth 2 (two) times daily.   [name] nystatin (MYCOSTATIN) 100000 UNIT/ML suspension Take 5 mLs (500,000 Units total) by mouth 4 (four) times daily.  . pantoprazole (PROTONIX) 40 MG tablet Take 1 tablet (40 mg total) by mouth 2 (two) times daily.  . potassium chloride (KLOR-CON) 10 MEQ tablet Take 2 tablets (20 mEq total) by mouth daily.   No facility-administered encounter medications on file as of 01/20/2021.   1. Other constipation Patient recently started iron supplementation, will check iron levels, reduce iron if able to see if this offers relief.  Encouraged patient to continue proper hydration, increase fiber, use stool softener on a daily basis.  2. Dark stools Previously reported in medical record  3. Iron deficiency anemia, unspecified iron deficiency anemia type  - CBC with Differential/Platelet - Iron, TIBC and Ferritin  Panel  4. Insomnia, unspecified type Encourage patient to work on improving sleep hygiene, avoid napping during the day  5. DM type 2 with diabetic peripheral neuropathy ([place])  - Comp. Metabolic Panel (12)  6. Mixed hyperlipidemia   7. Malignant neoplasm of lower lobe of left lung ([place])   8. Thrush Trial nystatin rinse - nystatin (MYCOSTATIN) 100000 UNIT/ML suspension; Take 5 mLs (500,000 Units total) by mouth 4 (four) times daily.  Dispense: 60 mL; Refill: 0  Patient did admit that he did not tell cardiology during his recent office visit that he was not taking his medications.  Patient encouraged to resume blood pressure and cholesterol medications at this time.    I have reviewed the patient's medical history (PMH, PSH, Social History, Family History, Medications, and allergies) , and have been updated if relevant. I spent 45 minutes reviewing chart and  face to face time with patient.    Follow-up: Return if symptoms worsen or fail to improve.   Loraine Grip Mayers, PA-C

## 2021-01-20 NOTE — Telephone Encounter (Signed)
Requested medication (s) are due for refill today: Yes  Requested medication (s) are on the active medication list: Yes  Last refill:  01/14/21  Future visit scheduled: Yes  Notes to clinic:  Pharmacy did not receive Rx, please resend     Requested Prescriptions  Pending Prescriptions Disp Refills   Budeson-Glycopyrrol-Formoterol (BREZTRI AEROSPHERE) 160-9-4.8 MCG/ACT AERO 5.9 g 12    Sig: Inhale 2 puffs into the lungs 2 (two) times daily.      Off-Protocol Failed - 01/20/2021  5:31 PM      Failed - Medication not assigned to a protocol, review manually.      Passed - Valid encounter within last 12 months    Recent Outpatient Visits           2 weeks ago Mixed hyperlipidemia   [facility] [place], [place], NP   4 months ago DM type 2 with diabetic peripheral neuropathy [facility])   [facility], [name], FNP   7 months ago DM type 2 with diabetic peripheral neuropathy [facility])   [facility], [name], FNP   9 months ago Dark stools   [place], FNP   1 year ago Essential hypertension   [place], FNP       Future Appointments             In 3 months Baity, Coralie Keens, NP [facility], [place]   In 10 months  [facility], [facility]

## 2021-01-21 MED ORDER — BREZTRI AEROSPHERE 160-9-4.8 MCG/ACT IN AERO: 2.0000 | INHALATION_SPRAY | Freq: Two times a day (BID) | RESPIRATORY_TRACT | 3 refills | Status: DC

## 2021-02-02 ENCOUNTER — Other Ambulatory Visit: Payer: Self-pay | Admitting: Surgical

## 2021-02-03 NOTE — Progress Notes (Signed)
MA LVM for patient to confirm if he will return to the [facility] or report to [facility] for completion of CMP

## 2021-02-04 ENCOUNTER — Other Ambulatory Visit: Payer: Self-pay

## 2021-02-04 MED ORDER — CLONIDINE HCL 0.1 MG PO TABS: 0.1000 mg | ORAL_TABLET | Freq: Two times a day (BID) | ORAL | 1 refills | Status: DC

## 2021-02-05 ENCOUNTER — Encounter: Payer: Self-pay | Admitting: Pulmonary Disease

## 2021-02-05 ENCOUNTER — Other Ambulatory Visit: Payer: Self-pay

## 2021-02-05 ENCOUNTER — Ambulatory Visit (INDEPENDENT_AMBULATORY_CARE_PROVIDER_SITE_OTHER): Payer: Medicare Other | Admitting: Pulmonary Disease

## 2021-02-05 VITALS — BP 138/78 | HR 88 | Temp 97.7°F | Ht 69.0 in | Wt 207.4 lb

## 2021-02-05 DIAGNOSIS — J449 Chronic obstructive pulmonary disease, unspecified: Secondary | ICD-10-CM | POA: Diagnosis not present

## 2021-02-05 DIAGNOSIS — C3492 Malignant neoplasm of unspecified part of left bronchus or lung: Secondary | ICD-10-CM | POA: Diagnosis not present

## 2021-02-05 DIAGNOSIS — J9611 Chronic respiratory failure with hypoxia: Secondary | ICD-10-CM | POA: Diagnosis not present

## 2021-02-05 MED ORDER — BREZTRI AEROSPHERE 160-9-4.8 MCG/ACT IN AERO: 160.0000 ug | INHALATION_SPRAY | Freq: Two times a day (BID) | RESPIRATORY_TRACT | 0 refills | Status: DC

## 2021-02-05 NOTE — Progress Notes (Signed)
Subjective:    Patient ID: Nathaniel Little, male    DOB: Jun 02, 1946, 75 y.o.   MRN: [id number] Chief Complaint  Patient presents with  . Follow-up    SOB-     HPI Patient is a 75 year old former smoker with 100-pack-year history who presents for follow-up of COPD and dyspnea.  Patient also had issues with a lung nodule and underwent robotic left lower lobectomy with node resection on 17 November 2020.  This was performed by Dr. Erasmo Leventhal.  Postoperatively he developed atelectasis on the left upper lobe requiring bronchoscopy to clear mucous plugs.  He required home oxygen after discharge.  Final pathology revealed left lower lobe invasive mucinous adenocarcinoma with 0/5 nodes involved.  The PFTs prior to surgery showed that he had moderate obstruction with no bronchodilator response and low normal to mild reduction diffusion capacity.  He continues to use oxygen at 2 L/min.  He is on Breztri twice a day but occasionally skips doses.  He uses albuterol as rescue.  He has had issues with anemia due to iron deficiency and this is being followed by his primary care.     Pulmonary Function Testing Results: 10/28/2020  FEV1 1.65 L or 56% predicted, FVC 2.50 L or 61% predicted, FEV1/FVC 66%.  Lung volumes were normal.  Diffusion capacity: Diffusion transfer capacity (Kco) low normal at 83%.  Review of Systems A 10 point review of systems was performed and it is as noted above otherwise negative.  Patient Active Problem List   Diagnosis Date Noted  . Other constipation 01/20/2021  . Iron deficiency anemia 01/20/2021  . Insomnia 01/20/2021  . Thrush 01/20/2021  . Pain due to onychomycosis of toenails of both feet 01/08/2021  . Callus 01/08/2021  . Coagulation disorder ([employer]) 01/08/2021  . Malignant neoplasm of lower lobe of left lung ([place]) 12/14/2020  . Goals of care, counseling/discussion 12/14/2020  . S/P robot-assisted Video Thoracoscopy with Left Lower Lobectomy, Lymph Node  Dissection, and Intercostal Nerve Block 11/17/2020  . Hip pain 09/11/2020  . Uncontrolled type 2 diabetes mellitus with hyperglycemia ([place]) 06/13/2020  . DM type 2 with diabetic peripheral neuropathy ([place]) 06/13/2020  . Post-operative pain 06/13/2020  . Tremor 06/13/2020  . Physical deconditioning 06/13/2020  . Dark stools 04/17/2020  . BRBPR (bright red blood per rectum) 04/17/2020  . S/P TAVR (transcatheter aortic valve replacement) 04/01/2020  . (HFpEF) heart failure with preserved ejection fraction ([place])   . Diabetes mellitus without complication ([place])   . Incidental pulmonary nodule, greater than or equal to 35mm 03/11/2020  . Duodenitis 02/18/2020  . Diabetes ([place]) 12/06/2019  . Erectile dysfunction due to arterial insufficiency 09/24/2019  . Lumbar degenerative disc disease 06/25/2019  . Sacroiliac joint dysfunction of both sides 06/25/2019  . Chronic heart failure with preserved ejection fraction ([place]) 01/19/2019  . Tobacco abuse 01/19/2019  . Severe aortic stenosis   . Hyperlipidemia 07/20/2018  . IPMN (intraductal papillary mucinous neoplasm) 12/24/2017  . Claudication in peripheral vascular disease ([place]) 08/22/2017  . Pancreatic mass 08/20/2017  . Aortic atherosclerosis ([place]) 07/25/2017  . BMI 38.0-38.9,adult 02/09/2017  . Arthritis pain of hip 01/07/2017  . Chronic obstructive pulmonary disease ([place]) 01/07/2017  . GERD (gastroesophageal reflux disease) 01/07/2017  . Essential hypertension 01/07/2017  . Depression 01/06/2017  . Sleep apnea with use of continuous positive airway pressure (CPAP) 01/06/2017  . Personal history of prostate cancer 03/04/2013   Social History   Tobacco Use  . Smoking status: Former Smoker  Packs/day: 4.00    Years: 68.00    Pack years: 272.00    Types: Cigarettes  . Smokeless tobacco: Former Systems developer    Types: Chew, [place] date: 2000  . Tobacco comment: quit smoking 11/17/2020  Substance Use Topics  . Alcohol use: Not Currently     Alcohol/week: 0.0 - 1.0 standard drinks    Comment: quit 40years ago    Allergies  Allergen Reactions  . Codeine Itching   Current Meds  Medication Sig  . albuterol (VENTOLIN HFA) 108 (90 Base) MCG/ACT inhaler Inhale 2 puffs into the lungs every 6 (six) hours as needed for wheezing or shortness of breath.  [name] aspirin EC 81 MG tablet Take 81 mg by mouth daily.  [name] atorvastatin (LIPITOR) 80 MG tablet Take 80 mg by mouth daily.   [name] BREZTRI AEROSPHERE 160-9-4.8 MCG/ACT AERO Inhale 2 puffs into the lungs 2 (two) times daily.  . carvedilol (COREG) 6.25 MG tablet Take 1 tablet (6.25 mg total) by mouth 2 (two) times daily with a meal.  . cloNIDine (CATAPRES) 0.1 MG tablet Take 1 tablet (0.1 mg total) by mouth 2 (two) times daily.  . diclofenac Sodium (VOLTAREN) 1 % GEL Apply 1 application topically 3 (three) times daily as needed (pain).  [name] ezetimibe (ZETIA) 10 MG tablet Take 1 tablet (10 mg total) by mouth daily.  . ferrous [id number] C-folic acid (TRINSICON / FOLTRIN) capsule Take 1 capsule by mouth 2 (two) times daily after a meal.  . fluticasone (FLONASE) 50 MCG/ACT nasal spray Place 1 spray into both nostrils daily as needed for allergies.  [name] gabapentin (NEURONTIN) 100 MG capsule Take 1-2 capsules (100-200 mg total) by mouth at bedtime. (Patient taking differently: Take 200 mg by mouth at bedtime.)  . insulin glargine (LANTUS) 100 unit/mL SOPN Inject 20 Units into the skin at bedtime.  [name] ipratropium-albuterol (DUONEB) 0.5-2.5 (3) MG/3ML SOLN Take 3 mLs by nebulization every 6 (six) hours as needed (Asthma).  . metFORMIN (GLUCOPHAGE) 500 MG tablet Take 1,000 mg by mouth daily with breakfast. 2 in the morning 1 at night.  . nystatin (MYCOSTATIN) 100000 UNIT/ML suspension Take 5 mLs (500,000 Units total) by mouth 4 (four) times daily.  . potassium chloride (KLOR-CON) 10 MEQ tablet Take 2 tablets (20 mEq total) by mouth daily.   Immunization History  Administered Date(s) Administered   . Influenza, High Dose Seasonal PF 08/09/2018  . Influenza,inj,Quad PF,6+ Mos 07/15/2019  . Influenza-Unspecified 07/12/2019, 08/04/2020  . Moderna Sars-Covid-2 Vaccination 11/22/2019, 12/20/2019, 08/26/2020  . PFIZER(Purple Top)SARS-COV-2 Vaccination 10/29/2019  . Pneumococcal Conjugate-13 12/30/2017       Objective:   Physical Exam BP 138/78 (BP Location: Left Arm, Patient Position: Sitting, Cuff Size: Normal)   Pulse 88   Temp 97.7 F (36.5 C) (Temporal)   Ht 5\' 9"  (1.753 m)   Wt 207 lb 6.4 oz (94.1 kg)   SpO2 96%   BMI 30.63 kg/m   GENERAL: Well-developed gentleman, fully ambulatory in no respiratory distress.  Comfortable with nasal cannula O2. HEAD: Normocephalic, atraumatic.  EYES: Pupils equal, round, reactive to light. No scleral icterus.  MOUTH:Nose/mouth/throat not examined due to masking requirements for COVID 19. NECK: Supple. No thyromegaly. No nodules. No JVD. Trachea midline, no crepitus. PULMONARY: Good air entry bilaterally, coarse breath sounds, no wheezes. CARDIOVASCULAR: S1 and S2. Regular rate and rhythm.No rubs, murmurs or gallops heard. GASTROINTESTINAL: Protuberant abdomen, soft. MUSCULOSKELETAL: No joint deformity, no clubbing, no edema.  NEUROLOGIC: He has a resting  tremor, no overt focal deficits otherwise. Speech is fluent.No gait disturbance. SKIN: Intact,warm,dry. Limited exam shows no rashes. PSYCH: Mood and behavior normal.      Assessment & Plan:     ICD-10-CM   1. Stage 2 moderate COPD by GOLD classification ([place])  J44.9    Continue Breztri 2 puffs twice a day Provided a spacer for better deposition of medication  2. Adenocarcinoma, lung, left [facility])  C34.92    Status post resection 17 November 2020 Follows at the cancer center  3. Chronic respiratory failure with hypoxia (HCC)  J96.11    Continue oxygen at 2 L/min currently Will requalify on follow-up   Meds ordered this encounter  Medications  .  Budeson-Glycopyrrol-Formoterol (BREZTRI AEROSPHERE) 160-9-4.8 MCG/ACT AERO    Sig: Inhale 160 mcg into the lungs 2 (two) times daily.    Dispense:  5.9 g    Refill:  0    Order Specific Question:   Lot Number?    Answer:   [id number]    Order Specific Question:   Expiration Date?    Answer:   07/03/2022    Order Specific Question:   Quantity    Answer:   1    Comments:   spacer also given with inhaler   We will see the patient in 2 months time.  We will perform requalification for oxygen at that time.  Renold Don, MD [facility]   *This note was dictated using voice recognition software/Dragon.  Despite best efforts to proofread, errors can occur which can change the meaning.  Any change was purely unintentional.

## 2021-02-05 NOTE — Patient Instructions (Signed)
We are giving you a spacer for your inhaler this will help with getting the medication deep into your lung.  Make sure you use the Breztri twice a day this makes it so that you do not have to use the nebulizer as much.  Continue using your oxygen as you are doing.  We will see you in follow-up in 2 months time call sooner should any new problems arise.  For results of your biopsy, call the [facility] records they will release those to you without an issue.  For your iron pills please call your diabetic doctor or your primary doctor.

## 2021-02-10 ENCOUNTER — Other Ambulatory Visit: Payer: Self-pay | Admitting: Physician Assistant

## 2021-02-10 ENCOUNTER — Telehealth: Payer: Self-pay | Admitting: Licensed Clinical Social Worker

## 2021-02-10 DIAGNOSIS — Z952 Presence of prosthetic heart valve: Secondary | ICD-10-CM

## 2021-02-10 NOTE — Telephone Encounter (Signed)
CSW received referral to assist patient with transportation needs. Patient enrolled in [employer] and provided the contact information to call for any medical appointment. Patient verbalizes understanding and will call closer to scheduled appointment. CSW available as needed. Nathaniel Little, [place], [place]

## 2021-02-13 ENCOUNTER — Encounter: Payer: Self-pay | Admitting: Pulmonary Disease

## 2021-02-20 DIAGNOSIS — J449 Chronic obstructive pulmonary disease, unspecified: Secondary | ICD-10-CM | POA: Diagnosis not present

## 2021-02-20 DIAGNOSIS — I5030 Unspecified diastolic (congestive) heart failure: Secondary | ICD-10-CM | POA: Diagnosis not present

## 2021-02-22 DIAGNOSIS — J449 Chronic obstructive pulmonary disease, unspecified: Secondary | ICD-10-CM | POA: Diagnosis not present

## 2021-02-22 DIAGNOSIS — I5030 Unspecified diastolic (congestive) heart failure: Secondary | ICD-10-CM | POA: Diagnosis not present

## 2021-02-24 ENCOUNTER — Other Ambulatory Visit: Payer: Self-pay | Admitting: Surgical

## 2021-02-26 DIAGNOSIS — H905 Unspecified sensorineural hearing loss: Secondary | ICD-10-CM | POA: Diagnosis not present

## 2021-03-05 ENCOUNTER — Telehealth: Payer: Self-pay | Admitting: Podiatry

## 2021-03-05 ENCOUNTER — Telehealth: Payer: Self-pay | Admitting: Licensed Clinical Social Worker

## 2021-03-05 NOTE — Telephone Encounter (Signed)
Pt left message asking if we had received the information from Dr Pasty Arch for him to get diabetic shoes.  I returned call and explained that we have specific paperwork that has to be done and we do not do that until pt comes in and picks out the shoes and is molded for the inserts. He said no one contacted him or told him this and I apologized and scheduled pt to come in on 6.6.2022 to [place] office.

## 2021-03-05 NOTE — Telephone Encounter (Signed)
Patient called to inquire about transport arrangements for upcoming appointments. CSW provided number for [employer]. Patient is already enrolled and set up. Patient will call to schedule his own rides for appointments. CSW available as needed. Raquel Sarna, [place], [place]

## 2021-03-09 ENCOUNTER — Ambulatory Visit (INDEPENDENT_AMBULATORY_CARE_PROVIDER_SITE_OTHER): Payer: Medicare Other

## 2021-03-09 ENCOUNTER — Other Ambulatory Visit: Payer: Self-pay

## 2021-03-09 DIAGNOSIS — E1142 Type 2 diabetes mellitus with diabetic polyneuropathy: Secondary | ICD-10-CM

## 2021-03-09 DIAGNOSIS — L84 Corns and callosities: Secondary | ICD-10-CM

## 2021-03-09 NOTE — Progress Notes (Signed)
Patient presents to the office today for diabetic shoe and insole measuring.  Patient was measured with brannock device to determine size and width for 1 pair of extra depth shoes and foam casted for 3 pair of insoles.   Documentation of medical necessity will be sent to patient's treating diabetic doctor to verify and sign.   Patient's diabetic provider: Warnell Forester, NP [facility]   Shoes and insoles will be ordered at that time and patient will be notified for an appointment for fitting when they arrive.   Shoe size (per patient): 11 regular   Brannock measurement: RT: 9 1/2 W,   LT: 8 1/2 W   Patient shoe selection-   1st choice:   #[id number] [place]  2nd choice:  [phone] Apex triple strap Walker  Shoe size ordered: 10 Wide Mens

## 2021-03-11 ENCOUNTER — Other Ambulatory Visit: Payer: Self-pay

## 2021-03-11 ENCOUNTER — Other Ambulatory Visit: Payer: Self-pay | Admitting: Surgical

## 2021-03-12 MED ORDER — CLONIDINE HCL 0.1 MG PO TABS: 0.1000 mg | ORAL_TABLET | Freq: Two times a day (BID) | ORAL | 2 refills | Status: DC

## 2021-03-13 ENCOUNTER — Telehealth: Payer: Self-pay | Admitting: Pulmonary Disease

## 2021-03-13 MED ORDER — AZITHROMYCIN 250 MG PO TABS: ORAL_TABLET | ORAL | 0 refills | Status: AC

## 2021-03-13 NOTE — Telephone Encounter (Signed)
Lets call him in a Z-Pak.  May have developed mild bronchitis.  We recently saw him and he was doing well.

## 2021-03-13 NOTE — Telephone Encounter (Signed)
Spoke to patient, who stated that has a prod cough with clear sputum and wheezing. Cough has been present for 3 weeks.  He is questioning if this is normal? He is using albuterol q4h and breztri bid.  Dr. Patsey Berthold, please advise. Thanks

## 2021-03-13 NOTE — Telephone Encounter (Signed)
Patient is aware of recommendations and voiced his understanding.  Rx has been sent to preferred pharmacy. Nothing further needed at this time.

## 2021-03-19 ENCOUNTER — Telehealth: Payer: Self-pay | Admitting: Family Medicine

## 2021-03-19 NOTE — Telephone Encounter (Signed)
   Nathaniel Little DOB: 1946/03/13 MRN: [id number]   RIDER WAIVER AND RELEASE OF LIABILITY  For purposes of improving physical access to our facilities, [facility] is pleased to partner with third parties to provide [facility] patients or other authorized individuals the option of convenient, on-demand ground transportation services (the [place]") through use of the technology service that enables users to request on-demand ground transportation from independent third-party providers.  By opting to use and accept these [employer], I, the undersigned, hereby agree on behalf of myself, and on behalf of any minor child using the Government social research officer for whom I am the parent or legal guardian, as follows:  Government social research officer provided to me are provided by independent third-party transportation providers who are not [employer] or employees and who are unaffiliated with [employer]. [facility] is neither a transportation carrier nor a common or public carrier. [facility] has no control over the quality or safety of the transportation that occurs as a result of the [employer]. [facility] cannot guarantee that any third-party transportation provider will complete any arranged transportation service. [facility] makes no representation, warranty, or guarantee regarding the reliability, timeliness, quality, safety, suitability, or availability of any of the Transport Services or that they will be error free. I fully understand that traveling by vehicle involves risks and dangers of serious bodily injury, including permanent disability, paralysis, and death. I agree, on behalf of myself and on behalf of any minor child using the Transport Services for whom I am the parent or legal guardian, that the entire risk arising out of my use of the [employer] remains solely with me, to the maximum extent permitted under applicable law. The [employer] are provided "as  is" and "as available." [facility] disclaims all representations and warranties, express, implied or statutory, not expressly set out in these terms, including the implied warranties of merchantability and fitness for a particular purpose. I hereby waive and release [facility], its agents, employees, officers, directors, representatives, insurers, attorneys, assigns, successors, subsidiaries, and affiliates from any and all past, present, or future claims, demands, liabilities, actions, causes of action, or suits of any kind directly or indirectly arising from acceptance and use of the [employer]. I further waive and release [facility] and its affiliates from all present and future liability and responsibility for any injury or death to persons or damages to property caused by or related to the use of the [employer]. I have read this Waiver and Release of Liability, and I understand the terms used in it and their legal significance. This Waiver is freely and voluntarily given with the understanding that my right (as well as the right of any minor child for whom I am the parent or legal guardian using the [employer]) to legal recourse against  in connection with the [employer] is knowingly surrendered in return for use of these services.   I attest that I read the consent document to [name], gave Mr. Odor the opportunity to ask questions and answered the questions asked (if any). I affirm that [name] then provided consent for he's participation in this program.     Nathaniel Little

## 2021-03-23 DIAGNOSIS — I5030 Unspecified diastolic (congestive) heart failure: Secondary | ICD-10-CM | POA: Diagnosis not present

## 2021-03-23 DIAGNOSIS — J449 Chronic obstructive pulmonary disease, unspecified: Secondary | ICD-10-CM | POA: Diagnosis not present

## 2021-03-25 DIAGNOSIS — I5030 Unspecified diastolic (congestive) heart failure: Secondary | ICD-10-CM | POA: Diagnosis not present

## 2021-03-25 DIAGNOSIS — J449 Chronic obstructive pulmonary disease, unspecified: Secondary | ICD-10-CM | POA: Diagnosis not present

## 2021-04-01 ENCOUNTER — Encounter: Payer: Self-pay | Admitting: Physician Assistant

## 2021-04-01 ENCOUNTER — Other Ambulatory Visit: Payer: Self-pay

## 2021-04-01 ENCOUNTER — Ambulatory Visit (HOSPITAL_COMMUNITY): Payer: Medicare Other | Attending: Internal Medicine

## 2021-04-01 ENCOUNTER — Ambulatory Visit (INDEPENDENT_AMBULATORY_CARE_PROVIDER_SITE_OTHER): Payer: Medicare Other | Admitting: Physician Assistant

## 2021-04-01 VITALS — BP 116/60 | HR 62 | Ht 69.0 in | Wt 211.0 lb

## 2021-04-01 DIAGNOSIS — Z85118 Personal history of other malignant neoplasm of bronchus and lung: Secondary | ICD-10-CM

## 2021-04-01 DIAGNOSIS — Z952 Presence of prosthetic heart valve: Secondary | ICD-10-CM | POA: Diagnosis not present

## 2021-04-01 DIAGNOSIS — I10 Essential (primary) hypertension: Secondary | ICD-10-CM

## 2021-04-01 DIAGNOSIS — Z87891 Personal history of nicotine dependence: Secondary | ICD-10-CM | POA: Diagnosis not present

## 2021-04-01 LAB — ECHOCARDIOGRAM COMPLETE
AR max vel: 2.82 cm2
AV Area VTI: 3.05 cm2
AV Area mean vel: 3.03 cm2
AV Mean grad: 10 mmHg
AV Peak grad: 20 mmHg
Ao pk vel: 2.23 m/s
Area-P 1/2: 3.03 cm2
S' Lateral: 3.2 cm

## 2021-04-01 MED ORDER — AMOXICILLIN 500 MG PO TABS: 2000.0000 mg | ORAL_TABLET | ORAL | 12 refills | Status: DC

## 2021-04-01 NOTE — Progress Notes (Signed)
[facility] [place]                                       Cardiology Office Note    Date:  04/01/2021   ID:  Nathaniel Little, DOB [birth year], MRN [id number]  PCP:  Verl Bangs, FNP  Cardiologist:  Ida Rogue, MD / Dr. Angelena Form & Dr. Roxy Manns (TAVR)  CC: 1 year s/p TAVR  History of Present Illness:  Nathaniel Little is a 75 y.o. male with a history of  chronic diastolic CHF, longstanding tobacco abuse with COPD, type 2 diabetes mellitus without complications, HTN, left lower bronchogenic carcinoma s/p lobectemy (11/2020), OSA on CPAP, obesity, and intraductal papillary mucinous neoplasm of the pancreas s/p Whipple procedure in 2019 and severe AS s/p TAVR (04/01/20) who presents to clinic for follow up.    Patient was first evaluated by a cardiologist in 2018 for symptoms of exertional shortness of breath.  Stress test was felt to be low risk for coronary ischemia and echocardiogram revealed normal left ventricular systolic function with moderate aortic stenosis.  Symptoms were attributed to heart failure with preserved ejection fraction and COPD.     He underwent Whipple procedure in 07/2018 at [facility] for intraductal papillary mucinous neoplasm of the pancreas.  The common and proper hepatic arteries were found to be dissected and occluded during surgery and ultimately the patient underwent stent placement in the gastroduodenal artery and hepatic arteries.  He was started on Plavix at that time.   The patient underwent follow-up echocardiogram Feb 07, 2020 revealing significant progression in the severity of aortic stenosis with preserved left ventricular systolic function.  Peak velocity across aortic valve was measured 4.1 m/s corresponding to mean transvalvular gradient estimated 37 mmHg.  DVI was reported 0.23 and aortic valve area calculated 0.85 cm by VTI. Left ventricular systolic function remain preserved with ejection  fraction estimated 65%.  The patient was referred to the multidisciplinary heart valve clinic for management.  Shortly after that the patient was hospitalized briefly at [facility] with abdominal pain, elevated white blood count, and mild lactic acidosis.  CT scan of the abdomen and pelvis revealed "considerable peripancreatic and duodenal inflammatory change consistent with some acute pancreatitis or duodenitis. The patient was treated with ciprofloxacin and Flagyl and symptoms resolved.  During his hospitalization he was evaluated by cardiology and he underwent diagnostic cardiac catheterization by Dr. Rockey Situ on Feb 21, 2020.  Catheterization revealed nonobstructive coronary artery disease with normal left ventricular systolic function and severe aortic stenosis. Mean transvalvular gradient across the aortic valve was measured 45 mmHg corresponding to valve area calculated 0.95 cm.  There was very mild elevation of right-sided pressures.  Patient was seen in consultation by Dr. Angelena Form and CT angiography was performed. CT scan of the chest revealed small pulmonary nodule in the left lower lobe suspicious for bronchogenic neoplasm. Follow up PET scan on 03/17/20 showed mild hypermetabolism, raising concern for primary bronchogenic neoplasm. There were no findings suspicious for metastatic disease. 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    He was evaluated by the multidisciplinary valve team and underwent successful TAVR with a 29 mm Edwards Sapien 3 THV via the TF approach on 04/01/20. Post operative showed EF 60%, normally functioning TAVR with  a mean gradient of 14 mmHg and trivial PVL. He was discharged on aspirin and plavix. 1 month echo showed EF 70%, normally functioning TAVR with a mean gradient of 14.3 mmHg and trivial PVL.  His pulmonary nodule was confirmed as adenocarcinoma of left  lower lobe of the lung, clinical stage Ia (T1, N0), pathologic stage Ia (T1b, N0) and underwent robot-assisted Video Thoracoscopy with Left Lower Lobectomy, Lymph Node Dissection, and Intercostal Nerve Block on 11/17/20 by Dr. Roxan Hockey.  Today he presents to clinic for follow up. He has quit smoking since his lobectomy. He stays busy cleaning his house and going to yard sales. He has had some shortness of breath since his lobectomy which he has been told is exepected. He has completely stopped smoking. No chest pain. No LE edema, orthopnea or PND. No dizziness or syncope. No blood in stool or urine. No palpitations.   Past Medical History:  Diagnosis Date   (HFpEF) heart failure with preserved ejection fraction ([place])    a. 08/2017 Echo: EF 60-65%, Gr1 DD.   Arthritis    COPD (chronic obstructive pulmonary disease) ([place])    Coronary artery disease 2021   Depression    Diabetes mellitus without complication (HCC)    Hypertension    Incidental pulmonary nodule, greater than or equal to 14m 03/11/2020   Left lower lobe - suspicious for bronchogenic neoplasm   Lymphedema    Morbid obesity [place]    Prostate cancer ([place] 03/2013   ? PANCREATIC   S/P TAVR (transcatheter aortic valve replacement) 04/01/2020   s/p TAVR with a 29 mm Edwards Sapien 3 THV via the TF approach by Drs MBuena Irish& ORoxy Manns  Severe aortic stenosis    Sleep apnea with use of continuous positive airway pressure (CPAP)    Tobacco abuse    Tremor    HEAD    Past Surgical History:  Procedure Laterality Date   ADENOIDECTOMY     APPENDECTOMY     CARDIAC CATHETERIZATION     CATARACT EXTRACTION W/PHACO Left 11/28/2018   Procedure: CATARACT EXTRACTION PHACO AND INTRAOCULAR LENS PLACEMENT ([place] LEFT, DIABETIC;  Surgeon: PBirder Robson MD;  Location: [facility] ORS;  Service: Ophthalmology;  Laterality: Left;  [place] 00:56 CDE 9.44 Fluid pack lot # [id number]   EUS N/A 10/13/2017   Procedure: FULL UPPER ENDOSCOPIC ULTRASOUND (EUS)  RADIAL;  Surgeon: [name];  [facility];  Service: Gastroenterology;  Laterality: N/A;   FLEXIBLE BRONCHOSCOPY N/A 11/19/2020   Procedure: FLEXIBLE BRONCHOSCOPY;  Surgeon: HMelrose Nakayama MD;  [place]  Service: Thoracic;  Laterality: N/A;   INTERCOSTAL NERVE BLOCK Left 11/17/2020   Procedure: INTERCOSTAL NERVE BLOCK;  Surgeon: HMelrose Nakayama MD;  [place]  Service: Thoracic;  Laterality: Left;   NODE DISSECTION Left 11/17/2020   Procedure: NODE DISSECTION;  Surgeon: HMelrose Nakayama MD;  [place]  Service: Thoracic;  Laterality: Left;   PANCREATICODUODENECTOMY  07/23/2018   RIGHT/LEFT HEART CATH AND CORONARY ANGIOGRAPHY N/A 02/21/2020   Procedure: RIGHT/LEFT HEART CATH AND CORONARY ANGIOGRAPHY;  Surgeon: GMinna Merritts MD;  [facility];  Service: Cardiovascular;  Laterality: N/A;   TEE WITHOUT CARDIOVERSION N/A 04/01/2020   Procedure: TRANSESOPHAGEAL ECHOCARDIOGRAM (TEE);  Surgeon: MBurnell Blanks MD;  [facility];  Service: Open Heart Surgery;  Laterality: N/A;   TONSILLECTOMY     TRANSCATHETER AORTIC VALVE REPLACEMENT, TRANSFEMORAL N/A 04/01/2020   Procedure: TRANSCATHETER AORTIC VALVE REPLACEMENT,  TRANSFEMORAL;  Surgeon: Burnell Blanks, MD;  Location: [place] [facility];  Service: Open Heart Surgery;  Laterality: N/A;   wipple      Current Medications: Outpatient Medications Prior to Visit  Medication Sig Dispense Refill   albuterol (VENTOLIN HFA) 108 (90 Base) MCG/ACT inhaler Inhale 2 puffs into the lungs every 6 (six) hours as needed for wheezing or shortness of breath.     aspirin EC 81 MG tablet Take 81 mg by mouth daily.     atorvastatin (LIPITOR) 80 MG tablet Take 80 mg by mouth daily.      BREZTRI AEROSPHERE 160-9-4.8 MCG/ACT AERO Inhale 2 puffs into the lungs 2 (two) times daily. 5.9 g 3   carvedilol (COREG) 6.25 MG tablet Take 1 tablet (6.25 mg total) by  mouth 2 (two) times daily with a meal. 180 tablet 3   cloNIDine (CATAPRES) 0.1 MG tablet Take 1 tablet (0.1 mg total) by mouth 2 (two) times daily. 60 tablet 2   ezetimibe (ZETIA) 10 MG tablet Take 1 tablet (10 mg total) by mouth daily. 90 tablet 3   ferrous [id number] C-folic acid (TRINSICON / FOLTRIN) capsule Take 1 capsule by mouth 2 (two) times daily after a meal. 60 capsule 1   gabapentin (NEURONTIN) 100 MG capsule Take 1-2 capsules (100-200 mg total) by mouth at bedtime. 180 capsule 1   insulin glargine (LANTUS) 100 unit/mL SOPN Inject 20 Units into the skin at bedtime.     ipratropium-albuterol (DUONEB) 0.5-2.5 (3) MG/3ML SOLN Take 3 mLs by nebulization every 6 (six) hours as needed (Asthma). 360 mL 12   metFORMIN (GLUCOPHAGE) 500 MG tablet Take 1,000 mg by mouth daily with breakfast. 2 in the morning 1 at night.     pantoprazole (PROTONIX) 40 MG tablet Take 1 tablet (40 mg total) by mouth 2 (two) times daily. 60 tablet 11   potassium chloride (KLOR-CON) 10 MEQ tablet Take 2 tablets (20 mEq total) by mouth daily. 5 tablet 0   Budeson-Glycopyrrol-Formoterol (BREZTRI AEROSPHERE) 160-9-4.8 MCG/ACT AERO Inhale 160 mcg into the lungs 2 (two) times daily. (Patient not taking: Reported on 04/01/2021) 5.9 g 0   diclofenac Sodium (VOLTAREN) 1 % GEL Apply 1 application topically 3 (three) times daily as needed (pain). (Patient not taking: Reported on 04/01/2021)     fluticasone (FLONASE) 50 MCG/ACT nasal spray Place 1 spray into both nostrils daily as needed for allergies. (Patient not taking: Reported on 04/01/2021)     nystatin (MYCOSTATIN) 100000 UNIT/ML suspension Take 5 mLs (500,000 Units total) by mouth 4 (four) times daily. (Patient not taking: Reported on 04/01/2021) 60 mL 0   No facility-administered medications prior to visit.     Allergies:   Codeine   Social History   Socioeconomic History   Marital status: Widowed    Spouse name: Not on file   Number of children: 2   Years  of education: Not on file   Highest education level: Not on file  Occupational History   Occupation: retired -Patent attorney  Tobacco Use   Smoking status: Former    Packs/day: 4.00    Years: 68.00    Pack years: 272.00    Types: Cigarettes   Smokeless tobacco: Former    Types: Chew, Snuff    Quit date: 2000   Tobacco comments:    quit smoking 11/17/2020  Vaping Use   Vaping Use: Never used  Substance and Sexual Activity   Alcohol use: Not Currently    Alcohol/week:  0.0 - 1.0 standard drinks    Comment: quit 40years ago   Drug use: No   Sexual activity: Not Currently  Other Topics Concern   Not on file  Social History Narrative   Living at [place] Strain: Low Risk    Difficulty of Paying Living Expenses: Not hard at all  Food Insecurity: No Food Insecurity   Worried About Charity fundraiser in the Last Year: Never true   [place] in the Last Year: Never true  Transportation Needs: No Transportation Needs   Lack of Transportation (Medical): No   Lack of Transportation (Non-Medical): No  Physical Activity: Inactive   Days of Exercise per Week: 0 days   Minutes of Exercise per Session: 0 min  Stress: No Stress Concern Present   Feeling of Stress : Not at all  Social Connections: Not on file     Family History:  The patient's family history includes Breast cancer in his mother; Cancer in his father; Cerebral palsy in his brother; Tuberculosis in his paternal uncle.     ROS:   Please see the history of present illness.    ROS All other systems reviewed and are negative.   PHYSICAL EXAM:   VS:  BP 116/60 (BP Location: Left Arm, Patient Position: Sitting, Cuff Size: Normal)   Pulse 62   Ht _0  (1.753 m)   Wt 211 lb (95.7 kg)   SpO2 96%   BMI 31.16 kg/m    GEN: Well nourished, well developed, in no acute distress, overweight HEENT: normal Neck: no JVD or masses Cardiac: RRR; soft flow  murmur. no rubs, or gallops,no edema  Respiratory: mild rhonchi GI: soft, nontender, nondistended, + BS MS: no deformity or atrophy Skin: warm and dry, no rash Neuro:  Alert and Oriented x 3, Strength and sensation are intact Psych: euthymic mood, full affect   Wt Readings from Last 3 Encounters:  04/01/21 211 lb (95.7 kg)  02/05/21 207 lb 6.4 oz (94.1 kg)  01/20/21 201 lb (91.2 kg)      Studies/Labs Reviewed:   EKG:  EKG is not ordered today.    Recent Labs: 04/02/2020: Magnesium 1.7 05/25/2020: B Natriuretic Peptide 100.5 11/19/2020: ALT 41 11/20/2020: BUN 23; Creatinine, Ser 1.20; Potassium 3.9; Sodium 137 11/23/2020: Hemoglobin 8.5; Platelets 156   Lipid Panel    Component Value Date/Time   CHOL 125 01/06/2021 1145   CHOL 149 02/08/2020 1228   TRIG 157 (H) 01/06/2021 1145   HDL 36 (L) 01/06/2021 1145   HDL 38 (L) 02/08/2020 1228   CHOLHDL 3.5 01/06/2021 1145   LDLCALC 66 01/06/2021 1145    Additional studies/ records that were reviewed today include:  TAVR OPERATIVE NOTE     Date of Procedure:                04/01/2020   Preoperative Diagnosis:      Severe Aortic Stenosis    Postoperative Diagnosis:    Same    Procedure:        Transcatheter Aortic Valve Replacement - Percutaneous Right Transfemoral Approach             Edwards Sapien 3 THV (size 29 mm, model # 9600TFX, serial # [id number])              Co-Surgeons:  Lauree Chandler, MD and Valentina Gu. Roxy Manns, MD    Anesthesiologist:                  Arabella Merles, MD   Echocardiographer:              Sanda Klein, MD   Pre-operative Echo Findings: Severe aortic stenosis Normal left ventricular systolic function   Post-operative Echo Findings: No paravalvular leak Normal left ventricular systolic function   _____________   Echo 04/02/20:  IMPRESSIONS  1. Left ventricular ejection fraction, by estimation, is 70 to 75%. The  left ventricle has hyperdynamic function.  The left ventricle has no  regional wall motion abnormalities. There is mild concentric left  ventricular hypertrophy. Left ventricular  diastolic parameters are consistent with Grade I diastolic dysfunction  (impaired relaxation).   2. Right ventricular systolic function is normal. The right ventricular  size is normal. Tricuspid regurgitation signal is inadequate for assessing  PA pressure.   3. Left atrial size was mildly dilated.   4. The mitral valve is normal in structure. No evidence of mitral valve  regurgitation.   5. The aortic valve has been repaired/replaced. Aortic valve  regurgitation is trivial. Procedure Date: 04/01/2020. Echo findings are  consistent with normal structure and function of the aortic valve  prosthesis. Aortic valve mean gradient measures 14.3  mmHg. Aortic valve Vmax measures 2.63 m/s.   Comparison(s): A prior study was performed on 04/01/2020. Prior images  reviewed side by side. There is a trivial perivalvular leak seen and the  TAVR gradients are slightly higher, due to increased cardiac output.   ___________________________  Echo 04/01/21 IMPRESSIONS  1. Left ventricular ejection fraction, by estimation, is 60 to 65%. The left ventricle has normal function. The left ventricle has no regional wall motion abnormalities. There is mild left ventricular hypertrophy. Left ventricular diastolic parameters are consistent with Grade I diastolic dysfunction (impaired relaxation).  2. Right ventricular systolic function is normal. The right ventricular size is normal.  3. Left atrial size was mildly dilated.  4. Right atrial size was moderately dilated.  5. The mitral valve is grossly normal. No evidence of mitral valve regurgitation.  6. The aortic valve has been repaired/replaced. Aortic valve regurgitation is trivial. There is a 29 mm Edwards Sapien prosthetic (TAVR) valve present in the aortic position. Procedure Date: 04/01/2020. Echo findings are consistent  with perivalvular leak  of the aortic prosthesis. Aortic valve area, by VTI measures 3.05 cm. Aortic valve mean gradient measures 10.0 mmHg. Aortic valve Vmax measures 2.23 m/s. Peak gradient 20 mmHg, DI is 0.53.  7. The inferior vena cava is normal in size with greater than 50% respiratory variability, suggesting right atrial pressure of 3 mmHg.   Comparison(s): Changes from prior study are noted. 05/01/2020: LVEF 60-65%, 29 mm TAVR valve, 16 mmHg mean and 31 mmHg peak gradients.  ASSESSMENT & PLAN:   Severe AS s/p TAVR: echo today shows EF 65%, normally functioning TAVR with a mean gradient of 10 mm hg and mild PVL.He has NYHA class II symptoms; dyspnea worsened slightly after lobectomy. SBE prophylaxis discussed; he is scheduled for a gum biopsy soon and so I will call in amoxicillin for him. Continue aspirin alone. Continue regular follow up with Dr. Rockey Situ.  HTN: BP well controlled. No changes made today.   Bronchogenic carcinoma: s/p robot-assisted Video Thoracoscopy with Left Lower Lobectomy, Lymph Node Dissection, and Intercostal Nerve Block on 11/17/20 by Dr. Roxan Hockey. Doing well after this.  Tobacco abuse: he has totally quit. I congradulated him on this.   Medication Adjustments/Labs and Tests Ordered: Current medicines are reviewed at length with the patient today.  Concerns regarding medicines are outlined above.  Medication changes, Labs and Tests ordered today are listed in the Patient Instructions below. Patient Instructions  Medication Instructions:  NO MEDICATION CHANGES WERE MADE TODAY *If you need a refill on your cardiac medications before your next appointment, please call your pharmacy*   Lab Work: NONE ORDERED TODAY If you have labs (blood work) drawn today and your tests are completely normal, you will receive your results only by: [place] (if you have MyChart) OR A paper copy in the mail If you have any lab test that is abnormal or we need to change  your treatment, we will call you to review the results.   Testing/Procedures: NONE ORDERED TODAY   Follow-Up: At [facility], you and your health needs are our priority.  As part of our continuing mission to provide you with exceptional heart care, we have created designated Provider Care Teams.  These Care Teams include your primary Cardiologist (physician) and Advanced Practice Providers (APPs -  Physician Assistants and Nurse Practitioners) who all work together to provide you with the care you need, when you need it.  We recommend signing up for the patient portal called "MyChart".  Sign up information is provided on this After Visit Summary.  MyChart is used to connect with patients for Virtual Visits (Telemedicine).  Patients are able to view lab/test results, encounter notes, upcoming appointments, etc.  Non-urgent messages can be sent to your provider as well.   To learn more about what you can do with MyChart, go to [email or web address].    Your next appointment:   WE WILL SEND OUT A LETTER IN October FOR YOU TO FOLLOW UP WITH DR. Rockey Situ  The format for your next appointment:     Provider:    Other Instructions     Signed, Angelena Form, PA-C  04/01/2021 2:34 PM    [name] [address], [place], [place]  [address] Phone: [phone]; Fax: [phone]

## 2021-04-01 NOTE — Patient Instructions (Signed)
Medication Instructions:  NO MEDICATION CHANGES WERE MADE TODAY *If you need a refill on your cardiac medications before your next appointment, please call your pharmacy*   Lab Work: NONE ORDERED TODAY If you have labs (blood work) drawn today and your tests are completely normal, you will receive your results only by: [place] (if you have MyChart) OR A paper copy in the mail If you have any lab test that is abnormal or we need to change your treatment, we will call you to review the results.   Testing/Procedures: NONE ORDERED TODAY   Follow-Up: At [facility], you and your health needs are our priority.  As part of our continuing mission to provide you with exceptional heart care, we have created designated Provider Care Teams.  These Care Teams include your primary Cardiologist (physician) and Advanced Practice Providers (APPs -  Physician Assistants and Nurse Practitioners) who all work together to provide you with the care you need, when you need it.  We recommend signing up for the patient portal called "MyChart".  Sign up information is provided on this After Visit Summary.  MyChart is used to connect with patients for Virtual Visits (Telemedicine).  Patients are able to view lab/test results, encounter notes, upcoming appointments, etc.  Non-urgent messages can be sent to your provider as well.   To learn more about what you can do with MyChart, go to [email or web address].    Your next appointment:   WE WILL SEND OUT A LETTER IN October FOR YOU TO FOLLOW UP WITH DR. Rockey Situ  The format for your next appointment:     Provider:    Other Instructions

## 2021-04-09 ENCOUNTER — Encounter: Payer: Self-pay | Admitting: Podiatry

## 2021-04-09 ENCOUNTER — Ambulatory Visit (INDEPENDENT_AMBULATORY_CARE_PROVIDER_SITE_OTHER): Payer: Medicare Other | Admitting: Podiatry

## 2021-04-09 ENCOUNTER — Other Ambulatory Visit: Payer: Self-pay

## 2021-04-09 DIAGNOSIS — M79674 Pain in right toe(s): Secondary | ICD-10-CM | POA: Diagnosis not present

## 2021-04-09 DIAGNOSIS — E1142 Type 2 diabetes mellitus with diabetic polyneuropathy: Secondary | ICD-10-CM

## 2021-04-09 DIAGNOSIS — M79675 Pain in left toe(s): Secondary | ICD-10-CM | POA: Diagnosis not present

## 2021-04-09 DIAGNOSIS — B351 Tinea unguium: Secondary | ICD-10-CM

## 2021-04-09 DIAGNOSIS — D689 Coagulation defect, unspecified: Secondary | ICD-10-CM

## 2021-04-09 NOTE — Progress Notes (Signed)
This patient returns to my office for at risk foot care.  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  This patient is unable to cut nails himself since the patient cannot reach his nails.These nails are painful walking and wearing shoes. Patient also has callus both feet. This patient presents for at risk foot care today.  General Appearance  Alert, conversant and in no acute stress.  Vascular  Dorsalis pedis and posterior tibial  pulses are palpable  bilaterally.  Capillary return is within normal limits  bilaterally. Temperature is within normal limits  bilaterally.  Neurologic  Senn-Weinstein monofilament wire test diminished  bilaterally. Muscle power within normal limits bilaterally.  Nails Thick disfigured discolored nails with subungual debris  from hallux to fifth toes bilaterally. No evidence of bacterial infection or drainage bilaterally.  Orthopedic  No limitations of motion  feet .  No crepitus or effusions noted.  No bony pathology .  Hammer toes  B/L.  Skin  normotropic skin with no porokeratosis noted bilaterally.  No signs of infections or ulcers noted.   Asymptomatic callus  B/l.  Onychomycosis  Pain in right toes  Pain in left toes  Consent was obtained for treatment procedures.   Mechanical debridement of nails 1-5  bilaterally performed with a nail nipper.  Filed with dremel without incident. Patient interested in diabetic shoes. Patient has been using pumice stone on his callus.   Return office visit    3 months                  Told patient to return for periodic foot care and evaluation due to potential at risk complications.   Gardiner Barefoot DPM

## 2021-04-10 ENCOUNTER — Other Ambulatory Visit: Payer: Self-pay

## 2021-04-10 DIAGNOSIS — E1142 Type 2 diabetes mellitus with diabetic polyneuropathy: Secondary | ICD-10-CM

## 2021-04-10 MED ORDER — GABAPENTIN 100 MG PO CAPS: 100.0000 mg | ORAL_CAPSULE | Freq: Every day | ORAL | 1 refills | Status: DC

## 2021-04-14 ENCOUNTER — Emergency Department: Payer: Medicare Other

## 2021-04-14 ENCOUNTER — Encounter: Payer: Self-pay | Admitting: Radiology

## 2021-04-14 ENCOUNTER — Emergency Department
Admission: EM | Admit: 2021-04-14 | Discharge: 2021-04-14 | Disposition: A | Discharge status: Home / Self Care | Payer: Medicare Other | Attending: Emergency Medicine | Admitting: Emergency Medicine

## 2021-04-14 DIAGNOSIS — I517 Cardiomegaly: Secondary | ICD-10-CM | POA: Diagnosis not present

## 2021-04-14 DIAGNOSIS — I10 Essential (primary) hypertension: Secondary | ICD-10-CM | POA: Insufficient documentation

## 2021-04-14 DIAGNOSIS — Z85118 Personal history of other malignant neoplasm of bronchus and lung: Secondary | ICD-10-CM | POA: Diagnosis not present

## 2021-04-14 DIAGNOSIS — Z7984 Long term (current) use of oral hypoglycemic drugs: Secondary | ICD-10-CM | POA: Diagnosis not present

## 2021-04-14 DIAGNOSIS — Z8546 Personal history of malignant neoplasm of prostate: Secondary | ICD-10-CM | POA: Diagnosis not present

## 2021-04-14 DIAGNOSIS — Z794 Long term (current) use of insulin: Secondary | ICD-10-CM | POA: Diagnosis not present

## 2021-04-14 DIAGNOSIS — R0602 Shortness of breath: Secondary | ICD-10-CM | POA: Diagnosis not present

## 2021-04-14 DIAGNOSIS — I2510 Atherosclerotic heart disease of native coronary artery without angina pectoris: Secondary | ICD-10-CM | POA: Insufficient documentation

## 2021-04-14 DIAGNOSIS — J90 Pleural effusion, not elsewhere classified: Secondary | ICD-10-CM | POA: Diagnosis not present

## 2021-04-14 DIAGNOSIS — Z7951 Long term (current) use of inhaled steroids: Secondary | ICD-10-CM | POA: Insufficient documentation

## 2021-04-14 DIAGNOSIS — Z87891 Personal history of nicotine dependence: Secondary | ICD-10-CM | POA: Insufficient documentation

## 2021-04-14 DIAGNOSIS — Z79899 Other long term (current) drug therapy: Secondary | ICD-10-CM | POA: Diagnosis not present

## 2021-04-14 DIAGNOSIS — Z7982 Long term (current) use of aspirin: Secondary | ICD-10-CM | POA: Diagnosis not present

## 2021-04-14 DIAGNOSIS — Z743 Need for continuous supervision: Secondary | ICD-10-CM | POA: Diagnosis not present

## 2021-04-14 DIAGNOSIS — E1142 Type 2 diabetes mellitus with diabetic polyneuropathy: Secondary | ICD-10-CM | POA: Diagnosis not present

## 2021-04-14 DIAGNOSIS — R0689 Other abnormalities of breathing: Secondary | ICD-10-CM | POA: Diagnosis not present

## 2021-04-14 DIAGNOSIS — J449 Chronic obstructive pulmonary disease, unspecified: Secondary | ICD-10-CM | POA: Insufficient documentation

## 2021-04-14 DIAGNOSIS — I700 Atherosclerosis of aorta: Secondary | ICD-10-CM | POA: Diagnosis not present

## 2021-04-14 DIAGNOSIS — U071 COVID-19: Secondary | ICD-10-CM

## 2021-04-14 DIAGNOSIS — R069 Unspecified abnormalities of breathing: Secondary | ICD-10-CM | POA: Diagnosis not present

## 2021-04-14 LAB — CBC WITH DIFFERENTIAL/PLATELET
Abs Immature Granulocytes: 0.03 10*3/uL (ref 0.00–0.07)
Basophils Absolute: 0 10*3/uL (ref 0.0–0.1)
Basophils Relative: 1 %
Eosinophils Absolute: 0.1 10*3/uL (ref 0.0–0.5)
Eosinophils Relative: 2 %
HCT: 37.5 % — ABNORMAL LOW (ref 39.0–52.0)
Hemoglobin: 13.2 g/dL (ref 13.0–17.0)
Immature Granulocytes: 1 %
Lymphocytes Relative: 15 %
Lymphs Abs: 0.7 10*3/uL (ref 0.7–4.0)
MCH: 31.8 pg (ref 26.0–34.0)
MCHC: 35.2 g/dL (ref 30.0–36.0)
MCV: 90.4 fL (ref 80.0–100.0)
Monocytes Absolute: 0.4 10*3/uL (ref 0.1–1.0)
Monocytes Relative: 8 %
Neutro Abs: 3.6 10*3/uL (ref 1.7–7.7)
Neutrophils Relative %: 73 %
Platelets: 139 10*3/uL — ABNORMAL LOW (ref 150–400)
RBC: 4.15 MIL/uL — ABNORMAL LOW (ref 4.22–5.81)
RDW: 13.6 % (ref 11.5–15.5)
WBC: 4.9 10*3/uL (ref 4.0–10.5)
nRBC: 0 % (ref 0.0–0.2)

## 2021-04-14 LAB — BASIC METABOLIC PANEL
Anion gap: 11 (ref 5–15)
BUN: 21 mg/dL (ref 8–23)
CO2: 25 mmol/L (ref 22–32)
Calcium: 8.9 mg/dL (ref 8.9–10.3)
Chloride: 101 mmol/L (ref 98–111)
Creatinine, Ser: 1.16 mg/dL (ref 0.61–1.24)
GFR, Estimated: >60 mL/min (ref >60)
Glucose, Bld: 124 mg/dL — ABNORMAL HIGH (ref 70–99)
Potassium: 3.8 mmol/L (ref 3.5–5.1)
Sodium: 137 mmol/L (ref 135–145)

## 2021-04-14 LAB — BLOOD GAS, VENOUS
Acid-Base Excess: 3 mmol/L — ABNORMAL HIGH (ref 0.0–2.0)
Bicarbonate: 28.5 mmol/L — ABNORMAL HIGH (ref 20.0–28.0)
O2 Saturation: 67.3 %
Patient temperature: 37
pCO2, Ven: 46 mmHg (ref 44.0–60.0)
pH, Ven: 7.4 (ref 7.250–7.430)
pO2, Ven: 35 mmHg (ref 32.0–45.0)

## 2021-04-14 LAB — BRAIN NATRIURETIC PEPTIDE: B Natriuretic Peptide: 77.3 pg/mL (ref 0.0–100.0)

## 2021-04-14 LAB — PROCALCITONIN: Procalcitonin: <0.1 ng/mL

## 2021-04-14 LAB — RESP PANEL BY RT-PCR (FLU A&B, COVID) ARPGX2
Influenza A by PCR: NEGATIVE
Influenza B by PCR: NEGATIVE
SARS Coronavirus 2 by RT PCR: POSITIVE — AB

## 2021-04-14 LAB — TROPONIN I (HIGH SENSITIVITY): Troponin I (High Sensitivity): 12 ng/L (ref <18)

## 2021-04-14 MED ORDER — IPRATROPIUM-ALBUTEROL 0.5-2.5 (3) MG/3ML IN SOLN
3.0000 mL | Freq: Once | RESPIRATORY_TRACT | Status: AC
  Administered 2021-04-14: 3 mL via RESPIRATORY_TRACT
  Filled 2021-04-14: qty 3

## 2021-04-14 MED ORDER — IOHEXOL 350 MG/ML SOLN
100.0000 mL | Freq: Once | INTRAVENOUS | Status: AC | PRN
  Administered 2021-04-14: 100 mL via INTRAVENOUS

## 2021-04-14 MED ORDER — NIRMATRELVIR/RITONAVIR (PAXLOVID)TABLET: 3.0000 | ORAL_TABLET | Freq: Two times a day (BID) | ORAL | 0 refills | Status: AC

## 2021-04-14 MED ORDER — METHYLPREDNISOLONE SODIUM SUCC 125 MG IJ SOLR
125.0000 mg | Freq: Once | INTRAMUSCULAR | Status: AC
  Administered 2021-04-14: 125 mg via INTRAVENOUS
  Filled 2021-04-14: qty 2

## 2021-04-14 NOTE — ED Notes (Signed)
Pt provided OJ

## 2021-04-14 NOTE — ED Provider Notes (Signed)
CT angiography without acute abnormality, COVID test is positive.  This is likely the cause of his fatigue.  Oxygenation is normal here, he is feeling much improved.  Appropriate for discharge, will Rx paxlovid, strict return precautions.  He agrees with this plan   Lavonia Drafts, MD 04/14/21 [phone]

## 2021-04-14 NOTE — ED Notes (Signed)
Pt in CT.

## 2021-04-14 NOTE — ED Provider Notes (Signed)
[facility] Emergency Department Provider Note  ____________________________________________  Time seen: Approximately 6:38 AM  I have reviewed the triage vital signs and the nursing notes.   HISTORY  Chief Complaint Shortness of Breath   HPI Jandiel Magallanes is a 75 y.o. male with history of CHF with preserved EF, COPD, CAD, diabetes, hypertension, lung cancer status postresection in June 2021, aortic stenosis status post TVAR, sleep apnea on CPAP, pancreatic cancer status post Whipple procedure not currently on chemotherapy who presents for evaluation of shortness of breath.  Patient reports progressively worsening shortness of breath for the last 2 days.  Cough productive of clear sputum.  Over the last 24 hours has noticed some streaking of blood in it.  Denies any personal or family history of PE or DVT, recent travel immobilization, leg pain or swelling, exogenous hormones.  He is not on blood thinners.  Denies fever chills, sore throat, chest pain, abdominal pain, vomiting or diarrhea.  Patient reports that he ran out of his inhalers and therefore has not been able to use them at home.  He is on chronic 1 L of nasal cannula and has not needed anymore.  This morning he was more severely short of breath which prompted EMS to be called.  Patient was satting well on his baseline oxygen.   Past Medical History:  Diagnosis Date   (HFpEF) heart failure with preserved ejection fraction ([place])    a. 08/2017 Echo: EF 60-65%, Gr1 DD.   Arthritis    COPD (chronic obstructive pulmonary disease) ([place])    Coronary artery disease 2021   Depression    Diabetes mellitus without complication (HCC)    Hypertension    Incidental pulmonary nodule, greater than or equal to 46m 03/11/2020   Left lower lobe - suspicious for bronchogenic neoplasm   Lymphedema    Morbid obesity ([place]    Prostate cancer ([place] 03/2013   ? PANCREATIC   S/P TAVR (transcatheter aortic valve  replacement) 04/01/2020   s/p TAVR with a 29 mm Edwards Sapien 3 THV via the TF approach by Drs MBuena Irish& ORoxy Manns  Severe aortic stenosis    Sleep apnea with use of continuous positive airway pressure (CPAP)    Tobacco abuse    Tremor    HEAD    Patient Active Problem List   Diagnosis Date Noted   Other constipation 01/20/2021   Iron deficiency anemia 01/20/2021   Insomnia 01/20/2021   Thrush 01/20/2021   Pain due to onychomycosis of toenails of both feet 01/08/2021   Callus 01/08/2021   Coagulation disorder ([place] 01/08/2021   Malignant neoplasm of lower lobe of left lung ([place] 12/14/2020   Goals of care, counseling/discussion 12/14/2020   S/P robot-assisted Video Thoracoscopy with Left Lower Lobectomy, Lymph Node Dissection, and Intercostal Nerve Block 11/17/2020   Hip pain 09/11/2020   Uncontrolled type 2 diabetes mellitus with hyperglycemia ([place] 06/13/2020   DM type 2 with diabetic peripheral neuropathy ([place] 06/13/2020   Post-operative pain 06/13/2020   Tremor 06/13/2020   Physical deconditioning 06/13/2020   Dark stools 04/17/2020   BRBPR (bright red blood per rectum) 04/17/2020   S/P TAVR (transcatheter aortic valve replacement) 04/01/2020   (HFpEF) heart failure with preserved ejection fraction (HCC)    Diabetes mellitus without complication (HCC)    Incidental pulmonary nodule, greater than or equal to 825m06/05/2020   Duodenitis 02/18/2020   Diabetes ([place]03/01/2020   Erectile dysfunction due to arterial insufficiency 09/24/2019   Lumbar  degenerative disc disease 06/25/2019   Sacroiliac joint dysfunction of both sides 06/25/2019   Chronic heart failure with preserved ejection fraction ([place]) 01/19/2019   Tobacco abuse 01/19/2019   Severe aortic stenosis    Hyperlipidemia 07/20/2018   IPMN (intraductal papillary mucinous neoplasm) 12/24/2017   Claudication in peripheral vascular disease ([place]) 08/22/2017   Pancreatic mass 08/20/2017   Aortic atherosclerosis ([place])  07/25/2017   BMI 38.0-38.9,adult 02/09/2017   Arthritis pain of hip 01/07/2017   Chronic obstructive pulmonary disease ([place]) 01/07/2017   GERD (gastroesophageal reflux disease) 01/07/2017   Essential hypertension 01/07/2017   Depression 01/06/2017   Sleep apnea with use of continuous positive airway pressure (CPAP) 01/06/2017   Personal history of prostate cancer 03/04/2013    Past Surgical History:  Procedure Laterality Date   ADENOIDECTOMY     APPENDECTOMY     CARDIAC CATHETERIZATION     CATARACT EXTRACTION W/PHACO Left 11/28/2018   Procedure: CATARACT EXTRACTION PHACO AND INTRAOCULAR LENS PLACEMENT (IOC) LEFT, DIABETIC;  Surgeon: Birder Robson, MD;  Location: [facility] ORS;  Service: Ophthalmology;  Laterality: Left;  [name]  00:56 CDE 9.44 Fluid pack lot # [id number]   EUS N/A 10/13/2017   Procedure: FULL UPPER ENDOSCOPIC ULTRASOUND (EUS) RADIAL;  Surgeon: Holly Bodily, MD;  Location: [facility] ENDOSCOPY;  Service: Gastroenterology;  Laterality: N/A;   FLEXIBLE BRONCHOSCOPY N/A 11/19/2020   Procedure: FLEXIBLE BRONCHOSCOPY;  Surgeon: Melrose Nakayama, MD;  Location: [place];  Service: Thoracic;  Laterality: N/A;   INTERCOSTAL NERVE BLOCK Left 11/17/2020   Procedure: INTERCOSTAL NERVE BLOCK;  Surgeon: Melrose Nakayama, MD;  Location: [place];  Service: Thoracic;  Laterality: Left;   NODE DISSECTION Left 11/17/2020   Procedure: NODE DISSECTION;  Surgeon: Melrose Nakayama, MD;  Location: [place];  Service: Thoracic;  Laterality: Left;   PANCREATICODUODENECTOMY  07/23/2018   RIGHT/LEFT HEART CATH AND CORONARY ANGIOGRAPHY N/A 02/21/2020   Procedure: RIGHT/LEFT HEART CATH AND CORONARY ANGIOGRAPHY;  Surgeon: Minna Merritts, MD;  Location: [place] CV LAB;  Service: Cardiovascular;  Laterality: N/A;   TEE WITHOUT CARDIOVERSION N/A 04/01/2020   Procedure: TRANSESOPHAGEAL ECHOCARDIOGRAM (TEE);  Surgeon: Burnell Blanks, MD;  Location: [place] CV LAB;  Service: Open Heart  Surgery;  Laterality: N/A;   TONSILLECTOMY     TRANSCATHETER AORTIC VALVE REPLACEMENT, TRANSFEMORAL N/A 04/01/2020   Procedure: TRANSCATHETER AORTIC VALVE REPLACEMENT, TRANSFEMORAL;  Surgeon: Burnell Blanks, MD;  Location: [place] CV LAB;  Service: Open Heart Surgery;  Laterality: N/A;   wipple      Prior to Admission medications   Medication Sig Start Date End Date Taking? Authorizing Provider  albuterol (VENTOLIN HFA) 108 (90 Base) MCG/ACT inhaler Inhale 2 puffs into the lungs every 6 (six) hours as needed for wheezing or shortness of breath. 11/23/20   Gold, Patrick Jupiter E, PA-C  amoxicillin (AMOXIL) 500 MG tablet Take 4 tablets (2,000 mg total) by mouth as directed. 1 hour prior to dental work including cleanings 04/01/21   Eileen Stanford, PA-C  aspirin EC 81 MG tablet Take 81 mg by mouth daily.    [provider]  atorvastatin (LIPITOR) 80 MG tablet Take 80 mg by mouth daily.     [provider]  BREZTRI AEROSPHERE 160-9-4.8 MCG/ACT AERO Inhale 2 puffs into the lungs 2 (two) times daily. 01/21/21   Karamalegos, Devonne Doughty, DO  carvedilol (COREG) 6.25 MG tablet Take 1 tablet (6.25 mg total) by mouth 2 (two) times daily with a meal. 11/07/20 11/02/21  Marrianne Mood D, PA-C  cloNIDine (CATAPRES) 0.1 MG tablet Take 1 tablet (0.1 mg total) by mouth 2 (two) times daily. 03/12/21   Jearld Fenton, NP  ezetimibe (ZETIA) 10 MG tablet Take 1 tablet (10 mg total) by mouth daily. 11/07/20 11/02/21  Marrianne Mood D, PA-C  ferrous [id number] C-folic acid (TRINSICON / FOLTRIN) capsule Take 1 capsule by mouth 2 (two) times daily after a meal. 11/23/20   Gold, Wayne E, PA-C  gabapentin (NEURONTIN) 100 MG capsule Take 1-2 capsules (100-200 mg total) by mouth at bedtime. 04/10/21   Jearld Fenton, NP  insulin glargine (LANTUS) 100 unit/mL SOPN Inject 20 Units into the skin at bedtime.    [provider]  ipratropium-albuterol (DUONEB) 0.5-2.5 (3) MG/3ML SOLN Take 3  mLs by nebulization every 6 (six) hours as needed (Asthma). 01/06/21   Kathrine Haddock, NP  metFORMIN (GLUCOPHAGE) 500 MG tablet Take 1,000 mg by mouth daily with breakfast. 2 in the morning 1 at night. 11/04/18   [provider]  pantoprazole (PROTONIX) 40 MG tablet Take 1 tablet (40 mg total) by mouth 2 (two) times daily. 12/06/19 04/01/21  [name], [name], FNP  potassium chloride (KLOR-CON) 10 MEQ tablet Take 2 tablets (20 mEq total) by mouth daily. 11/23/20   John Giovanni, PA-C    Allergies Codeine  Family History  Problem Relation Age of Onset   Breast cancer Mother    Cancer Father        Black Lung   Cerebral palsy Brother    Tuberculosis Paternal Uncle    Prostate cancer Neg Hx    Kidney cancer Neg Hx    Bladder Cancer Neg Hx     Social History Social History   Tobacco Use   Smoking status: Former    Packs/day: 4.00    Years: 68.00    Pack years: 272.00    Types: Cigarettes   Smokeless tobacco: Former    Types: Chew, Snuff    Quit date: 2000   Tobacco comments:    quit smoking 11/17/2020  Vaping Use   Vaping Use: Never used  Substance Use Topics   Alcohol use: Not Currently    Alcohol/week: 0.0 - 1.0 standard drinks    Comment: quit 40years ago   Drug use: No    Review of Systems  Constitutional: Negative for fever. Eyes: Negative for visual changes. ENT: Negative for sore throat. Neck: No neck pain  Cardiovascular: Negative for chest pain. Respiratory: +shortness of breath, cough, hemoptysis Gastrointestinal: Negative for abdominal pain, vomiting or diarrhea. Genitourinary: Negative for dysuria. Musculoskeletal: Negative for back pain. Skin: Negative for rash. Neurological: Negative for headaches, weakness or numbness. Psych: No SI or HI  ____________________________________________   PHYSICAL EXAM:  VITAL SIGNS: ED Triage Vitals  Enc Vitals Group     BP 04/14/21 0557 (!) 181/69     Pulse Rate 04/14/21 0557 65     Resp 04/14/21 0557 20      Temp 04/14/21 0557 98.4 F (36.9 C)     Temp Source 04/14/21 0557 Oral     SpO2 04/14/21 0554 95 %     Weight 04/14/21 0557 210 lb (95.3 kg)     Height 04/14/21 0557 _0  (1.753 m)     Head Circumference --      Peak Flow --      Pain Score --      Pain Loc --      Pain Edu? --  Excl. in [place]? --     Constitutional: Alert and oriented, mild respiratory distress.  HEENT:      Head: Normocephalic and atraumatic.         Eyes: Conjunctivae are normal. Sclera is non-icteric.       Mouth/Throat: Mucous membranes are moist.       Neck: Supple with no signs of meningismus. Cardiovascular: Regular rate and rhythm. No murmurs, gallops, or rubs. 2+ symmetrical distal pulses are present in all extremities. No JVD. Respiratory: Tachypneic with slightly increased work of breathing, diffuse wheezing bilaterally satting well on his 1 L nasal Gastrointestinal: Soft, non tender, and non distended with positive bowel sounds. No rebound or guarding. Genitourinary: No CVA tenderness. Musculoskeletal:  No edema, cyanosis, or erythema of extremities. Neurologic: Normal speech and language. Face is symmetric. Moving all extremities. No gross focal neurologic deficits are appreciated. Skin: Skin is warm, dry and intact. No rash noted. Psychiatric: Mood and affect are normal. Speech and behavior are normal.  ____________________________________________   LABS (all labs ordered are listed, but only abnormal results are displayed)  Labs Reviewed  CBC WITH DIFFERENTIAL/PLATELET - Abnormal; Notable for the following components:      Result Value   RBC 4.15 (*)    HCT 37.5 (*)    Platelets 139 (*)    All other components within normal limits  BASIC METABOLIC PANEL - Abnormal; Notable for the following components:   Glucose, Bld 124 (*)    All other components within normal limits  BLOOD GAS, VENOUS - Abnormal; Notable for the following components:   Bicarbonate 28.5 (*)    Acid-Base Excess 3.0  (*)    All other components within normal limits  RESP PANEL BY RT-PCR (FLU A&B, COVID) ARPGX2  BRAIN NATRIURETIC PEPTIDE  PROCALCITONIN  TROPONIN I (HIGH SENSITIVITY)   ____________________________________________  EKG  ED ECG REPORT I, Rudene Re, the attending physician, personally viewed and interpreted this ECG.  Sinus rhythm, rate of 68, first-degree AV block, normal QTC, left axis deviation, no ST elevations or depressions.  No significant changes when compared to prior from April 2022 ____________________________________________  RADIOLOGY  I have personally reviewed the images performed during this visit and I agree with the Radiologist's read.   Interpretation by Radiologist:  DG Chest Portable 1 View  Result Date: 04/14/2021 CLINICAL DATA:  75 year old male with history of shortness of breath for the past 2 days. EXAM: PORTABLE CHEST 1 VIEW COMPARISON:  Chest x-ray 12/23/2020. FINDINGS: Moderate to large left pleural effusion with opacity at the left base which may reflect atelectasis and/or consolidation. Right lung is clear. No right pleural effusion. No pneumothorax. No evidence of pulmonary edema. Cardiac silhouette is partially obscured, but heart size appears mildly enlarged. Upper mediastinal contours are within normal limits. Atherosclerotic calcifications in the thoracic aorta. Status post TAVR. IMPRESSION: 1. Increasing moderate to large left pleural effusion with worsening atelectasis and/or consolidation in the left lower lobe. 2. Aortic atherosclerosis. 3. Status post TAVR. Electronically Signed   By: Vinnie Langton M.D.   On: 04/14/2021 07:00     ____________________________________________   PROCEDURES  Procedure(s) performed:yes .1-3 Lead EKG Interpretation  Date/Time: 04/14/2021 6:42 AM Performed by: Rudene Re, MD Authorized by: Rudene Re, MD     Interpretation: abnormal     ECG rate assessment: normal     Rhythm: sinus  rhythm     Ectopy: none     Conduction: abnormal     Critical Care performed:  None  ____________________________________________   INITIAL IMPRESSION / ASSESSMENT AND PLAN / ED COURSE  75 y.o. male with history of CHF with preserved EF, COPD, CAD, diabetes, hypertension, lung cancer status postresection in June 2021, aortic stenosis status post TVAR, sleep apnea on CPAP, pancreatic cancer status post Whipple procedure not currently on chemotherapy who presents for evaluation of shortness of breath.  Patient with a productive cough for the last 2 days.  Does not have his inhaler at home.  Arrives to the emergency room with wheezing bilaterally but satting well on his baseline 1 L nasal cannula.  Differential diagnoses including COPD exacerbation versus CHF exacerbation versus bronchitis versus pneumonia versus COVID versus flu versus PE versus recurrence of lung cancer.  Will start with 3 DuoNeb's and Solu-Medrol.  We will get basic blood work and chest x-ray.  We will do COVID and flu swab.  Patient placed on telemetry for monitor cardiorespiratory status.  Old medical records reviewed  _________________________ 7:13 AM on 04/14/2021 -----------------------------------------  Labs with no leukocytosis, no anemia.  First troponin is negative.  Metabolic panel is within normal limits.  Normal BNP.  Normal VBG.  Procalcitonin and viral swabs pending.  Chest x-ray concerning for a large left-sided pleural effusion with possible consolidation.  With a history of cancer we will go ahead and get the CT to rule out pneumonia versus malignancy.  We will also do a CT angio since patient has had some hemoptysis to rule out PE.  Care transferred to Dr. Corky Downs.  _____________________________________________ Please note:  Patient was evaluated in Emergency Department today for the symptoms described in the history of present illness. Patient was evaluated in the context of the global COVID-19 pandemic,  which necessitated consideration that the patient might be at risk for infection with the SARS-CoV-2 virus that causes COVID-19. Institutional protocols and algorithms that pertain to the evaluation of patients at risk for COVID-19 are in a state of rapid change based on information released by regulatory bodies including the CDC and federal and state organizations. These policies and algorithms were followed during the patient's care in the ED.  Some ED evaluations and interventions may be delayed as a result of limited staffing during the pandemic.   [place] Controlled Substance Database was reviewed by me. ____________________________________________   FINAL CLINICAL IMPRESSION(S) / ED DIAGNOSES   Final diagnoses:  Shortness of breath      NEW MEDICATIONS STARTED DURING THIS VISIT:  ED Discharge Orders     None        Note:  This document was prepared using Dragon voice recognition software and may include unintentional dictation errors.    Alfred Levins, [place], MD 04/14/21 [phone]

## 2021-04-14 NOTE — ED Notes (Signed)
Dr Corky Downs notified pt covid +. Orders to be placed as needed

## 2021-04-14 NOTE — ED Notes (Addendum)
Pt provided phone to call ride, unable to reach contact.

## 2021-04-14 NOTE — ED Triage Notes (Signed)
Pt presents from home with a complaint SOB x 2 days. He wears 1L [place] chronically and has a Hx of Lung cancer.

## 2021-04-21 ENCOUNTER — Other Ambulatory Visit: Payer: Self-pay | Admitting: Family Medicine

## 2021-04-21 ENCOUNTER — Other Ambulatory Visit: Payer: Self-pay | Admitting: Pulmonary Disease

## 2021-04-21 NOTE — Telephone Encounter (Signed)
Requested medication (s) are due for refill today: yes  Requested medication (s) are on the active medication list:  yes  Last refill:  03/23/2021  Future visit scheduled: yes   Notes to clinic:  Medication not assigned to a protocol, review manually   Requested Prescriptions  Pending Prescriptions Disp Refills   BREZTRI AEROSPHERE 160-9-4.8 MCG/ACT AERO [Pharmacy Med Name: BREZTRI AEROSPHERE 160-9-4.8 MCG/AC] 10.7 g     Sig: INHALE 2 PUFFS INTO THE LUNGS TWICE DAILY      Off-Protocol Failed - 04/21/2021 10:41 AM      Failed - Medication not assigned to a protocol, review manually.      Passed - Valid encounter within last 12 months    Recent Outpatient Visits           3 months ago Mixed hyperlipidemia   [place], NP   7 months ago DM type 2 with diabetic peripheral neuropathy [facility])   [facility], [name], FNP   10 months ago DM type 2 with diabetic peripheral neuropathy [facility])   [facility], [name], FNP   1 year ago Dark stools   [place], FNP   1 year ago Essential hypertension   [place], FNP       Future Appointments             In 3 weeks Baity, Coralie Keens, NP [facility] [place]   In 7 months  [facility], [facility]

## 2021-05-04 ENCOUNTER — Ambulatory Visit: Payer: Medicare Other | Admitting: Pulmonary Disease

## 2021-05-04 DIAGNOSIS — J449 Chronic obstructive pulmonary disease, unspecified: Secondary | ICD-10-CM | POA: Diagnosis not present

## 2021-05-04 DIAGNOSIS — I5030 Unspecified diastolic (congestive) heart failure: Secondary | ICD-10-CM | POA: Diagnosis not present

## 2021-05-18 ENCOUNTER — Ambulatory Visit (INDEPENDENT_AMBULATORY_CARE_PROVIDER_SITE_OTHER): Payer: Medicare Other | Admitting: General Practice

## 2021-05-18 ENCOUNTER — Ambulatory Visit
Admission: RE | Admit: 2021-05-18 | Discharge: 2021-05-18 | Disposition: A | Discharge status: Home / Self Care | Payer: Medicare Other | Attending: Internal Medicine | Admitting: Internal Medicine

## 2021-05-18 ENCOUNTER — Encounter: Payer: Self-pay | Admitting: Internal Medicine

## 2021-05-18 ENCOUNTER — Ambulatory Visit
Admission: RE | Admit: 2021-05-18 | Discharge: 2021-05-18 | Disposition: A | Discharge status: Home / Self Care | Payer: Medicare Other | Source: Ambulatory Visit | Attending: Internal Medicine | Admitting: Internal Medicine

## 2021-05-18 ENCOUNTER — Other Ambulatory Visit: Payer: Self-pay

## 2021-05-18 ENCOUNTER — Ambulatory Visit (INDEPENDENT_AMBULATORY_CARE_PROVIDER_SITE_OTHER): Payer: Medicare Other | Admitting: Internal Medicine

## 2021-05-18 VITALS — BP 151/52 | HR 56 | Temp 97.3°F | Resp 17 | Ht 69.0 in | Wt 214.2 lb

## 2021-05-18 DIAGNOSIS — M25551 Pain in right hip: Secondary | ICD-10-CM

## 2021-05-18 DIAGNOSIS — K1370 Unspecified lesions of oral mucosa: Secondary | ICD-10-CM | POA: Diagnosis not present

## 2021-05-18 DIAGNOSIS — K219 Gastro-esophageal reflux disease without esophagitis: Secondary | ICD-10-CM

## 2021-05-18 DIAGNOSIS — E1142 Type 2 diabetes mellitus with diabetic polyneuropathy: Secondary | ICD-10-CM | POA: Diagnosis not present

## 2021-05-18 DIAGNOSIS — G4730 Sleep apnea, unspecified: Secondary | ICD-10-CM | POA: Diagnosis not present

## 2021-05-18 DIAGNOSIS — E6609 Other obesity due to excess calories: Secondary | ICD-10-CM

## 2021-05-18 DIAGNOSIS — Z23 Encounter for immunization: Secondary | ICD-10-CM | POA: Diagnosis not present

## 2021-05-18 DIAGNOSIS — Z6831 Body mass index (BMI) 31.0-31.9, adult: Secondary | ICD-10-CM

## 2021-05-18 DIAGNOSIS — J449 Chronic obstructive pulmonary disease, unspecified: Secondary | ICD-10-CM

## 2021-05-18 DIAGNOSIS — M79651 Pain in right thigh: Secondary | ICD-10-CM

## 2021-05-18 DIAGNOSIS — I739 Peripheral vascular disease, unspecified: Secondary | ICD-10-CM

## 2021-05-18 DIAGNOSIS — F324 Major depressive disorder, single episode, in partial remission: Secondary | ICD-10-CM

## 2021-05-18 DIAGNOSIS — M5136 Other intervertebral disc degeneration, lumbar region: Secondary | ICD-10-CM

## 2021-05-18 DIAGNOSIS — M545 Low back pain, unspecified: Secondary | ICD-10-CM | POA: Diagnosis not present

## 2021-05-18 DIAGNOSIS — J431 Panlobular emphysema: Secondary | ICD-10-CM

## 2021-05-18 DIAGNOSIS — I10 Essential (primary) hypertension: Secondary | ICD-10-CM

## 2021-05-18 DIAGNOSIS — I700 Atherosclerosis of aorta: Secondary | ICD-10-CM | POA: Diagnosis not present

## 2021-05-18 DIAGNOSIS — I350 Nonrheumatic aortic (valve) stenosis: Secondary | ICD-10-CM | POA: Diagnosis not present

## 2021-05-18 DIAGNOSIS — J418 Mixed simple and mucopurulent chronic bronchitis: Secondary | ICD-10-CM

## 2021-05-18 DIAGNOSIS — N5201 Erectile dysfunction due to arterial insufficiency: Secondary | ICD-10-CM

## 2021-05-18 DIAGNOSIS — I5032 Chronic diastolic (congestive) heart failure: Secondary | ICD-10-CM

## 2021-05-18 DIAGNOSIS — D508 Other iron deficiency anemias: Secondary | ICD-10-CM

## 2021-05-18 DIAGNOSIS — R5381 Other malaise: Secondary | ICD-10-CM

## 2021-05-18 DIAGNOSIS — E782 Mixed hyperlipidemia: Secondary | ICD-10-CM

## 2021-05-18 DIAGNOSIS — F5101 Primary insomnia: Secondary | ICD-10-CM

## 2021-05-18 DIAGNOSIS — Z9181 History of falling: Secondary | ICD-10-CM

## 2021-05-18 DIAGNOSIS — R251 Tremor, unspecified: Secondary | ICD-10-CM

## 2021-05-18 DIAGNOSIS — M1611 Unilateral primary osteoarthritis, right hip: Secondary | ICD-10-CM | POA: Diagnosis not present

## 2021-05-18 NOTE — Progress Notes (Signed)
Subjective:    Patient ID: Nathaniel Little, male    DOB: 10/30/1945, 75 y.o.   MRN: [id number]  HPI  Patient presents the clinic today for follow-up of chronic conditions.  He is establishing care with me today, transferring care from [facility], NP.  HTN: His BP today is 151/52.  He is taking Clonidine and Carvedilol as prescribed.  ECG from 04/2021 reviewed.  DM 2 with Neuropathy: His last A1c was 5.9%, 01/2021.  He is taking Lantus and Metformin as prescribed.  He takes Gabapentin for neuropathic pain.  His sugars range 150.  He checks his feet routinely.  His last eye exam was 06/2018.  Flu 08/2020.  Pneumovax 05/2021.  Prevnar 06/2018.  COVID x4.  CHF: He reports chronic cough and shortness of breath.  He is taking Clonidine, Carvedilol as prescribed.  Echo from 03/2021 reviewed.  He follows with [facility] cardiology.  COPD with [facility]: He reports chronic cough and shortness of breath. He is on oxygen at nights.  He is taking Symbicort and Albuterol as prescribed.  There are no PFTs on file.  HLD with PVD: His last LDL was 66, triglycerides 157, 01/2021.  He is not taking Atorvastatin.  He is taking Zetia as prescribed he does not consume a low-fat diet.  ED: He is not taking any medication for this at this time.  He does not follow with urology.  GERD: He is not sure what triggers this.  He denies breakthrough on Pantoprazole.  There is no upper GI on file.  OSA: He is not currently wearing a CPAP.  There is no sleep study on file.  Insomnia: He reports difficulty falling and staying asleep.  He is not taking any medications for sleep at this time.  There is no sleep study on file.  IDA: His last H/H was 13.2/37.5, 04/2021.  He is  not taking oral iron OTC.  OA: Mainly in his back.  He takes Tylenol and gabapentin as prescribed with some relief of symptoms.  He has noticed an increase in right hip and thigh pain and worsening balance.  He does report intermittent falls.  He follows with  orthopedics.  Tremor: Intermittent in his hands.  He is not taking anything for tremors at this time.  He does not follow with neurology.  He is also getting dentures.  He needs a referral to an oral surgeon for evaluation of a skin flap on his lower gumline.  Review of Systems     Past Medical History:  Diagnosis Date   (HFpEF) heart failure with preserved ejection fraction ([place])    a. 08/2017 Echo: EF 60-65%, Gr1 DD.   Arthritis    COPD (chronic obstructive pulmonary disease) ([place])    Coronary artery disease 2021   Depression    Diabetes mellitus without complication (H[facility])    Hypertension    Incidental pulmonary nodule, greater than or equal to 50mm 03/11/2020   Left lower lobe - suspicious for bronchogenic neoplasm   Lymphedema    Morbid obesity ([place])    Prostate cancer ([place]) 03/2013   ? 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     amoxicillin (AMOXIL) 500 MG tablet Take 4 tablets (2,000 mg total) by mouth as directed. 1 hour prior to dental work including cleanings 12 tablet 12   aspirin EC 81 MG tablet Take 81 mg by mouth daily.     atorvastatin (LIPITOR) 80 MG tablet Take 80 mg by mouth daily.      BREZTRI AEROSPHERE 160-9-4.8 MCG/ACT AERO INHALE 2 PUFFS INTO THE LUNGS TWICE DAILY 10.7 g 2   carvedilol (COREG) 6.25 MG tablet Take 1 tablet (6.25 mg total) by mouth 2 (two) times daily with a meal. 180 tablet 3   cloNIDine (CATAPRES) 0.1 MG tablet Take 1 tablet (0.1 mg total) by mouth 2 (two) times daily. 60  tablet 2   ezetimibe (ZETIA) 10 MG tablet Take 1 tablet (10 mg total) by mouth daily. 90 tablet 3   ferrous [id number] C-folic acid (TRINSICON / FOLTRIN) capsule Take 1 capsule by mouth 2 (two) times daily after a meal. 60 capsule 1   gabapentin (NEURONTIN) 100 MG capsule Take 1-2 capsules (100-200 mg total) by mouth at bedtime. 180 capsule 1   insulin glargine (LANTUS) 100 unit/mL SOPN Inject 20 Units into the skin at bedtime.     ipratropium-albuterol (DUONEB) 0.5-2.5 (3) MG/3ML SOLN Take 3 mLs by nebulization every 6 (six) hours as needed (Asthma). 360 mL 12   metFORMIN (GLUCOPHAGE) 500 MG tablet Take 1,000 mg by mouth daily with breakfast. 2 in the morning 1 at night.     pantoprazole (PROTONIX) 40 MG tablet Take 1 tablet (40 mg total) by mouth 2 (two) times daily. 60 tablet 11   potassium chloride (KLOR-CON) 10 MEQ tablet Take 2 tablets (20 mEq total) by mouth daily. 5 tablet 0   No current facility-administered medications for this visit.    Allergies  Allergen Reactions   Codeine Itching    Family History  Problem Relation Age of Onset   Breast cancer Mother    Cancer Father        Black Lung   Cerebral palsy Brother    Tuberculosis Paternal Uncle    Prostate cancer Neg Hx    Kidney cancer Neg Hx    Bladder Cancer Neg Hx     Social History   Socioeconomic History   Marital status: Widowed    Spouse name: Not on file   Number of children: 2   Years of education: Not on file   Highest education level: Not on file  Occupational History   Occupation: retired -Patent attorney  Tobacco Use   Smoking status: Former    Packs/day: 4.00    Years: 68.00    Pack years: 272.00    Types: Cigarettes   Smokeless tobacco: Former    Types: Chew, Snuff    Quit date: 2000   Tobacco comments:    quit smoking 11/17/2020  Vaping Use   Vaping Use: Never used  Substance and Sexual Activity   Alcohol use: Not Currently    Alcohol/week: 0.0 - 1.0 standard drinks     Comment: quit 40years ago   Drug use: No   Sexual activity: Not Currently  Other Topics Concern   Not on file  Social History Narrative   Living at [place]  Resource Strain: Low Risk    Difficulty of Paying Living Expenses: Not hard at all  Food Insecurity: No Food Insecurity   Worried About Charity fundraiser in the Last Year: Never true   Ran Out of Food in the Last Year: Never true  Transportation Needs: No Transportation Needs   Lack of Transportation (Medical): No   Lack of Transportation (Non-Medical): No  Physical Activity: Inactive   Days of Exercise per Week: 0 days   Minutes of Exercise per Session: 0 min  Stress: No Stress Concern Present   Feeling of Stress : Not at all  Social Connections: Not on file  Intimate Partner Violence: Not on file     Constitutional: Denies fever, malaise, fatigue, headache or abrupt weight changes.  HEENT: Denies eye pain, eye redness, ear pain, ringing in the ears, wax buildup, runny nose, nasal congestion, bloody nose, or sore throat. Respiratory: Patient reports chronic cough and shortness of breath.  Denies difficulty breathing, or sputum production.   Cardiovascular: Patient reports intermittent swelling in legs.  Denies chest pain, chest tightness, palpitations or swelling in the hands  Gastrointestinal: Denies abdominal pain, bloating, constipation, diarrhea or blood in the stool.  GU: Denies urgency, frequency, pain with urination, burning sensation, blood in urine, odor or discharge. Musculoskeletal: Patient reports chronic back pain, right hip and thigh pain.  Denies decrease in range of motion, muscle pain or joint swelling.  Skin: Denies redness, rashes, lesions or ulcercations.  Neurological: Patient reports difficulty with balance.  Denies dizziness, difficulty with memory, difficulty with speech or problems coordination.  Psych: Denies anxiety, depression, SI/HI.  No  other specific complaints in a complete review of systems (except as listed in HPI above).  Objective:   Physical Exam   BP (!) 151/52 (BP Location: Right Arm, Patient Position: Sitting, Cuff Size: Large)   Pulse (!) 56   Temp (!) 97.3 F (36.3 C) (Temporal)   Resp 17   Ht 5\' 9"  (1.753 m)   Wt 214 lb 3.2 oz (97.2 kg)   SpO2 97%   BMI 31.63 kg/m   Wt Readings from Last 3 Encounters:  04/14/21 210 lb (95.3 kg)  04/01/21 211 lb (95.7 kg)  02/05/21 207 lb 6.4 oz (94.1 kg)    General: Appears his stated age, obese, chronically ill-appearing, in NAD. Skin: Warm, dry and intact. No ulcerations noted. HEENT: Head: normal shape and size; Eyes: sclera white and EOMs intact;  Cardiovascular: Normal rate and rhythm. S1,S2 noted.  Murmur noted.  Trace pitting BLE edema. No carotid bruits noted. Pulmonary/Chest: Normal effort and positive vesicular breath sounds. No respiratory distress. No wheezes, rales or ronchi noted.  Abdomen: Soft and nontender. Normal bowel sounds.  Musculoskeletal: Normal abduction, abduction, internal and external rotation of the right hip.  No bony tenderness noted over the cervical spine.  Gait slow and slightly unsteady with use of walker. Neurological: Alert and oriented.  Psychiatric: Mood and affect normal. Behavior is normal. Judgment and thought content normal.     BMET    Component Value Date/Time   NA 137 04/14/2021 0609   NA 140 11/07/2020 1534   K 3.8 04/14/2021 0609   CL 101 04/14/2021 0609   CO2 25 04/14/2021 0609   GLUCOSE 124 (H) 04/14/2021 0609   BUN 21 04/14/2021 0609   BUN 26 11/07/2020 1534   CREATININE 1.16 04/14/2021 0609   CREATININE 1.05 12/06/2019 1420   CALCIUM 8.9 04/14/2021 0609   GFRNONAA >60 04/14/2021 1660  GFRNONAA 70 12/06/2019 1420   GFRAA 55 (L) 11/07/2020 1534   GFRAA 81 12/06/2019 1420    Lipid Panel     Component Value Date/Time   CHOL 125 01/06/2021 1145   CHOL 149 02/08/2020 1228   TRIG 157 (H) 01/06/2021  1145   HDL 36 (L) 01/06/2021 1145   HDL 38 (L) 02/08/2020 1228   CHOLHDL 3.5 01/06/2021 1145   LDLCALC 66 01/06/2021 1145    CBC    Component Value Date/Time   WBC 4.9 04/14/2021 0609   RBC 4.15 (L) 04/14/2021 0609   HGB 13.2 04/14/2021 0609   HGB 10.3 (L) 11/07/2020 1534   HCT 37.5 (L) 04/14/2021 0609   HCT 31.8 (L) 11/07/2020 1534   PLT 139 (L) 04/14/2021 0609   PLT 158 11/07/2020 1534   MCV 90.4 04/14/2021 0609   MCV 88 11/07/2020 1534   MCH 31.8 04/14/2021 0609   MCHC 35.2 04/14/2021 0609   RDW 13.6 04/14/2021 0609   RDW 17.6 (H) 11/07/2020 1534   LYMPHSABS 0.7 04/14/2021 0609   LYMPHSABS 1.1 02/08/2020 1228   MONOABS 0.4 04/14/2021 0609   EOSABS 0.1 04/14/2021 0609   EOSABS 0.1 02/08/2020 1228   BASOSABS 0.0 04/14/2021 0609   BASOSABS 0.0 02/08/2020 1228    Hgb A1C Lab Results  Component Value Date   HGBA1C 6.9 (H) 11/14/2020           Assessment & Plan:   Right Hip/Thigh Pain, Balance Problem:  X-ray right hip today X-ray lumbar today to rule out sciatica Continue Tylenol and gabapentin as prescribed Consider referral to PT for further evaluation treatment  Oral Lesion:  Referral to oral surgery for further evaluation RTC in 6 months for your annual exam Webb Silversmith, NP This visit occurred during the SARS-CoV-2 public health emergency.  Safety protocols were in place, including screening questions prior to the visit, additional usage of staff PPE, and extensive cleaning of exam room while observing appropriate contact time as indicated for disinfecting solutions.

## 2021-05-18 NOTE — Chronic Care Management (AMB) (Signed)
Chronic Care Management   CCM RN Visit Note  05/18/2021 Name: Nathaniel Little MRN: [id number] DOB: [birth year]  Subjective: Nathaniel Little is a 75 y.o. year old male who is a primary care patient of Jearld Fenton, NP. The care management team was consulted for assistance with disease management and care coordination needs.    Engaged with patient by telephone for follow up visit in response to provider referral for case management and/or care coordination services.   Consent to Services:  The patient was given information about Chronic Care Management services, agreed to services, and gave verbal consent prior to initiation of services.  Please see initial visit note for detailed documentation.   Patient agreed to services and verbal consent obtained.   Assessment: Review of patient past medical history, allergies, medications, health status, including review of consultants reports, laboratory and other test data, was performed as part of comprehensive evaluation and provision of chronic care management services.   SDOH (Social Determinants of Health) assessments and interventions performed:  SDOH Interventions    Flowsheet Row Most Recent Value  SDOH Interventions   Physical Activity Interventions Other (Comments)  [no structured activities- limited mobility]  Stress Interventions Other (Comment)  [saw pcp today]  Social Connections Interventions Other (Comment)  [has a good support system, thankful for CCM team]        CCM Care Plan  Allergies  Allergen Reactions   Codeine Itching    Outpatient Encounter Medications as of 05/18/2021  Medication Sig   albuterol (VENTOLIN HFA) 108 (90 Base) MCG/ACT inhaler Inhale 2 puffs into the lungs every 6 (six) hours as needed for wheezing or shortness of breath.   amoxicillin (AMOXIL) 500 MG tablet Take 4 tablets (2,000 mg total) by mouth as directed. 1 hour prior to dental work including cleanings (Patient not taking: Reported on 05/18/2021)    aspirin EC 81 MG tablet Take 81 mg by mouth daily.   atorvastatin (LIPITOR) 80 MG tablet Take 80 mg by mouth daily.  (Patient not taking: Reported on 05/18/2021)   BREZTRI AEROSPHERE 160-9-4.8 MCG/ACT AERO INHALE 2 PUFFS INTO THE LUNGS TWICE DAILY (Patient not taking: Reported on 05/18/2021)   budesonide-formoterol (SYMBICORT) 80-4.5 MCG/ACT inhaler Inhale 2 puffs into the lungs 2 (two) times daily.   carvedilol (COREG) 6.25 MG tablet Take 1 tablet (6.25 mg total) by mouth 2 (two) times daily with a meal.   cloNIDine (CATAPRES) 0.1 MG tablet Take 1 tablet (0.1 mg total) by mouth 2 (two) times daily.   ezetimibe (ZETIA) 10 MG tablet Take 1 tablet (10 mg total) by mouth daily.   ferrous [id number] C-folic acid (TRINSICON / FOLTRIN) capsule Take 1 capsule by mouth 2 (two) times daily after a meal. (Patient not taking: Reported on 05/18/2021)   gabapentin (NEURONTIN) 100 MG capsule Take 1-2 capsules (100-200 mg total) by mouth at bedtime.   insulin glargine (LANTUS) 100 unit/mL SOPN Inject 20 Units into the skin at bedtime.   ipratropium-albuterol (DUONEB) 0.5-2.5 (3) MG/3ML SOLN Take 3 mLs by nebulization every 6 (six) hours as needed (Asthma).   metFORMIN (GLUCOPHAGE) 500 MG tablet Take 1,000 mg by mouth daily with breakfast. 2 in the morning 1 at night.   pantoprazole (PROTONIX) 40 MG tablet Take 1 tablet (40 mg total) by mouth 2 (two) times daily.   potassium chloride (KLOR-CON) 10 MEQ tablet Take 2 tablets (20 mEq total) by mouth daily.   No facility-administered encounter medications on file as of 05/18/2021.    Patient Active  Problem List   Diagnosis Date Noted   Other constipation 01/20/2021   Iron deficiency anemia 01/20/2021   Insomnia 01/20/2021   Malignant neoplasm of lower lobe of left lung ([place]) 12/14/2020   DM type 2 with diabetic peripheral neuropathy ([place]) 06/13/2020   Tremor 06/13/2020   Incidental pulmonary nodule, greater than or equal to 70m 03/11/2020   Erectile  dysfunction due to arterial insufficiency 09/24/2019   Lumbar degenerative disc disease 06/25/2019   Sacroiliac joint dysfunction of both sides 06/25/2019   Chronic heart failure with preserved ejection fraction ([place] 01/19/2019   Severe aortic stenosis    Hyperlipidemia 07/20/2018   Claudication in peripheral vascular disease ([facility] 08/22/2017   Aortic atherosclerosis ([facility] 07/25/2017   Chronic obstructive pulmonary disease ([facility] 01/07/2017   GERD (gastroesophageal reflux disease) 01/07/2017   Essential hypertension 01/07/2017   Depression 01/06/2017   Sleep apnea with use of continuous positive airway pressure (CPAP) 01/06/2017   Personal history of prostate cancer 03/04/2013    Conditions to be addressed/monitored:HTN, COPD, DMII, Depression, and chronic pain and fall and safety  Care Plan : RNCM: COPD (Adult)  Updates made by TVanita Inglessince 05/18/2021 12:00 AM     Problem: RNCM: Psychological Adjustment to Diagnosis (COPD)   Priority: Medium     Long-Range Goal: RNCM: Management of COPD   Start Date: 10/20/2020  Expected End Date: 02/23/2022  This Visit's Progress: On track  Priority: Medium  Note:   Current Barriers:  Knowledge deficits related to basic understanding of COPD disease process Knowledge deficits related to basic COPD self care/management Knowledge deficit related to basic understanding of how to use inhalers and how inhaled medications work Knowledge deficit related to importance of energy conservation Limited Social Support Unable to independently manage COPD Lacks social connections Does not contact provider office for questions/concerns  Case Manager Clinical Goal(s):  patient will report using inhalers as prescribed including rinsing mouth after use  patient will report utilizing pursed lip breathing for shortness of breath  patient will be able to verbalize understanding of COPD action plan and when to seek appropriate levels of medical care   patient will engage in lite exercise as tolerated to build/regain stamina and strength and reduce shortness of breath through activity tolerance  patient will verbalize basic understanding of COPD disease process and self care activities patient will not be hospitalized for COPD exacerbation  Interventions:  Collaboration with BJearld Fenton NP regarding development and update of comprehensive plan of care as evidenced by provider attestation and co-signature Inter-disciplinary care team collaboration (see longitudinal plan of care) Provided patient with basic written and verbal COPD education on self care/management/and exacerbation prevention  Provided patient with COPD action plan and reinforced importance of daily self assessment. 05-18-2021: The patient is asking about portable oxygen because he is experiencing shortness of breath when he goes out to his MD appointments. He has two large tanks but can not put them on his walker or carry them.  Discussed with the patient having a 6 minute walk test and he does not ever remember doing this. Will collaborate with the pcp and pulmonary provider, Dr. GDuwayne Heckthe process so the patient can see if he qualifies for portable oxygen to be paid through his insurance. Discussed the process and qualifications to get portable oxygen. The patient verbalized understanding. He did let the RNCM know that he gets his oxygen supplied through [facility] their number is [phone] The patient denies any acute distress. Paces his  activities.  Discussed Pulmonary Rehab and offered to assist with referral placement. 05-18-2021: Collaboration with pulmonary specialist related to portable oxygen request.  Provided written and verbal instructions on pursed lip breathing and utilized returned demonstration as teach back Provided instruction about proper use of medications used for management of COPD including inhalers Advised patient to self assesses COPD action plan  zone and make appointment with provider if in the yellow zone for 48 hours without improvement. 05-18-2021: The patient is aware of triggers that cause exacerbation. Will continue to monitor for changes.  Provided patient with education about the role of exercise in the management of COPD Advised patient to engage in light exercise as tolerated 3-5 days a week. 05-18-2021: Limited mobility and high fall risk.  Provided education about and advised patient to utilize infection prevention strategies to reduce risk of respiratory infection. 05-18-2021: Education and support given to the patient on preventing infection. The patient is having lab work and Production manager today. Will continue to monitor for changes. Patient Goals/Self-Care Activities:  - counseling provided - decision-making supported - depression screen reviewed - emotional support provided - verbalization of feelings encouraged Follow Up Plan: Telephone follow up appointment with care management team member scheduled for: 06-01-2021 at 1 pm    Task: RNCM: Support Psychosocial Response to Chronic Obstructive Pulmonary Disease Completed 05/18/2021  Outcome: Positive  Note:   Care Management Activities:    - counseling provided - decision-making supported - depression screen reviewed - emotional support provided - verbalization of feelings encouraged        Problem: Symptom Exacerbation (COPD) Resolved 05/18/2021     Goal: RNCM: Smoking cessation/Symptom Exacerbation Prevented or Minimized Completed 05/18/2021  This Visit's Progress: On track  Priority: Medium  Note:   Current Barriers: 05-18-2021: The patient has successfully quit smoking at this time. Will continue to monitor for changes. Closing this goal Unable to independently effectively quit smoking Does not adhere to provider recommendations re: smoking cessation Lacks social connections Does not contact provider office for questions/concerns Tobacco abuse of 65 years; currently  smoking 1 ppd Previous quit attempts, unsuccessful "several" successful using "a few" Reports smoking within 30 minutes of waking up Reports triggers to smoke include: illness, stress Reports motivation to quit smoking includes: knows it will help him manage his chronic conditions more effectively and improve his overall health On a scale of 1-10, reports MOTIVATION to quit is 8 On a scale of 1-10, reports CONFIDENCE in quitting is 7 Clinical Goal(s):  Over the next 120 days, patient will work with RN Case Engineer, civil (consulting) and provider towards tobacco cessation  Interventions: Collaboration with Jearld Fenton, NP regarding development and update of comprehensive plan of care as evidenced by provider attestation and co-signature Inter-disciplinary care team collaboration (see longitudinal plan of care) Evaluation of current treatment plan reviewed Provided contact information for [place] [facility] (1-800-QUIT-NOW). Patient will outreach this group for support. Discussed plans with patient for ongoing care management follow up and provided patient with direct contact information for care management team Provided patient with printed smoking cessation educational materials Provided contact information for [place] [facility] (1-800-QUIT-NOW). Patient will outreach this group for support. Evaluation of current treatment plan reviewed Patient Goals/Self-Care Activities Over the next 120 days, patient will:  - watch tv and talk to friends as a habit during cravings  - verbally commit to reducing tobacco consumption Follow Up Plan: Telephone follow up appointment with care management team member scheduled for: 12-15-2020 at  1030 am    Task: Identify and Minimize Risk of COPD Exacerbation Completed 05/18/2021  Outcome: Positive  Note:   Care Management Activities:    - barriers to lifestyle changes reviewed and addressed - barriers to treatment reviewed and addressed - breathing  techniques encouraged - healthy lifestyle promoted - modification of home and work environment promoted - rescue (action) plan reviewed - signs/symptoms of infection reviewed - signs/symptoms of worsening disease assessed - symptom triggers identified    Notes:     Care Plan : RNCM: Hypertension (Adult)  Updates made by Vanita Ingles since 05/18/2021 12:00 AM     Problem: RNCM: Hypertension (Hypertension)   Priority: Medium     Long-Range Goal: RNCM: Hypertension Monitored   Start Date: 10/20/2020  This Visit's Progress: On track  Priority: Medium  Note:   Objective:  Last practice recorded BP readings:  BP Readings from Last 3 Encounters:  05/18/21 (!) 151/52  04/14/21 (!) 173/68  04/01/21 116/60   Most recent eGFR/CrCl: No results found for: EGFR  No components found for: CRCL Current Barriers:  Knowledge Deficits related to basic understanding of hypertension pathophysiology and self care management Knowledge Deficits related to understanding of medications prescribed for management of hypertension Limited Social Support Unable to independently HTN Does not contact provider office for questions/concerns Case Manager Clinical Goal(s):   patient will verbalize understanding of plan for hypertension management patient will attend all scheduled medical appointments: cardiologist 10-21-2020 patient will demonstrate improved adherence to prescribed treatment plan for hypertension as evidenced by taking all medications as prescribed, monitoring and recording blood pressure as directed, adhering to low sodium/DASH diet patient will demonstrate improved health management independence as evidenced by checking blood pressure as directed and notifying PCP if SBP>160 or DBP > 90, taking all medications as prescribe, and adhering to a low sodium diet as discussed. patient will verbalize basic understanding of hypertension disease process and self health management plan as evidenced by  compliance with medications, heart healthy diet, working with CCM team to maintain health and well being. Interventions:  Collaboration with Jearld Fenton, NP regarding development and update of comprehensive plan of care as evidenced by provider attestation and co-signature Inter-disciplinary care team collaboration (see longitudinal plan of care) Evaluation of current treatment plan related to hypertension self management and patient's adherence to plan as established by provider. 05-18-2021: The patient states his blood pressures have been elevated because he has a lot going on with his health right now. The patient is concerned that he just hasn't felt the best. Empathetic listening and support given. The patient had not recently been working with the CCM team as he had not been able to be reached. The patient provided the [facility] with an additional phone number to call. [phone].  Will update record. Will continue to monitor.  Provided education to patient re: stroke prevention, s/s of heart attack and stroke, DASH diet, complications of uncontrolled blood pressure Reviewed medications with patient and discussed importance of compliance. 05-18-2021: Reviewed with the patient the patient has medications and no issues related to medications cost.  Discussed plans with patient for ongoing care management follow up and provided patient with direct contact information for care management team Advised patient, providing education and rationale, to monitor blood pressure daily and record, calling PCP for findings outside established parameters.  Reviewed scheduled/upcoming provider appointments including:Saw pcp today, has follow up for specialist coming up, having lab work and xrays.  Patient Goals/Self-Care Activities  patient will:  -  Self administers medications as prescribed Attends all scheduled provider appointments Calls provider office for new concerns, questions, or BP outside discussed  parameters Checks BP and records as discussed Follows a low sodium diet/DASH diet - blood pressure equipment and technique reviewed - blood pressure trends reviewed - depression screen reviewed - home or ambulatory blood pressure monitoring encouraged Follow Up Plan: Telephone follow up appointment with care management team member scheduled for: 06-01-2021 at 1 pm    Task: RNCM: Identify and Monitor Blood Pressure Elevation Completed 05/18/2021  Outcome: Positive  Note:   Care Management Activities:    - blood pressure equipment and technique reviewed - blood pressure trends reviewed - depression screen reviewed - home or ambulatory blood pressure monitoring encouraged        Care Plan : RNCM: Diabetes Type 2 (Adult)  Updates made by Vanita Ingles since 05/18/2021 12:00 AM     Problem: RNCM: Glycemic Management (Diabetes, Type 2)   Priority: Medium     Long-Range Goal: RNCM: DM-Glycemic Management Optimized   Start Date: 10/20/2020  Expected End Date: 02/23/2022  This Visit's Progress: On track  Priority: Medium  Note:   Objective:  Lab Results  Component Value Date   HGBA1C 6.9 (H) 11/14/2020   Lab Results  Component Value Date   CREATININE 1.16 04/14/2021   CREATININE 1.20 11/20/2020   CREATININE 1.36 (H) 11/19/2020   No results found for: EGFR Current Barriers:  Knowledge Deficits related to basic Diabetes pathophysiology and self care/management Knowledge Deficits related to medications used for management of diabetes Limited Social Support Unable to independently manage DM Unable to self administer medications as prescribed Lacks social connections Does not contact provider office for questions/concerns Case Manager Clinical Goal(s):  Collaboration with Jearld Fenton, NP regarding development and update of comprehensive plan of care as evidenced by provider attestation and co-signature Inter-disciplinary care team collaboration (see longitudinal plan of  care) patient will demonstrate improved adherence to prescribed treatment plan for diabetes self care/management as evidenced by:  daily monitoring and recording of CBG  adherence to ADA/ carb modified diet adherence to prescribed medication regimen Interventions:  Provided education to patient about basic DM disease process Discussed plans with patient for ongoing care management follow up and provided patient with direct contact information for care management team Provided patient with written educational materials related to hypo and hyperglycemia and importance of correct treatment. 05-18-2021: The patient denies any lows today. States that he is checking regularly and this am it was 68 Advised patient, providing education and rationale, to check cbg BID and record, calling pcp for findings outside established parameters.  05-18-2021: States this am reading was 152 and sometimes depending on what he eats it has gone up to 200's but denies any lows. Did ask about a new glucose meter. States his messes up sometimes and parts of it are coming apart. Will collaborate with pcp for help with a new glucose meter.  Review of patient status, including review of consultants reports, relevant laboratory and other test results, and medications completed. Patient Goals/Self-Care Activities patient will:  - UNABLE to independently DM Self administers oral medications as prescribed Attends all scheduled provider appointments Checks blood sugars as prescribed and utilize hyper and hypoglycemia protocol as needed Adheres to prescribed ADA/carb modified - barriers to adherence to treatment plan identified - blood glucose monitoring encouraged - blood glucose readings reviewed - resources required to improve adherence to care identified - self-awareness of signs/symptoms of hypo or  hyperglycemia encouraged - use of blood glucose monitoring log promoted Follow Up Plan: Telephone follow up appointment with  care management team member scheduled for: 06-01-2021 at 1 pm     Task: RNCM: Alleviate Barriers to Glycemic Management Completed 05/18/2021  Outcome: Positive  Note:   Care Management Activities:    - barriers to adherence to treatment plan identified - blood glucose monitoring encouraged - blood glucose readings reviewed - resources required to improve adherence to care identified - self-awareness of signs/symptoms of hypo or hyperglycemia encouraged - use of blood glucose monitoring log promoted        Care Plan : RNCM: Fall Risk (Adult)  Updates made by Vanita Ingles since 05/18/2021 12:00 AM     Problem: RNCM: Fall Risk   Priority: High  Onset Date: 05/18/2021     Long-Range Goal: RNCM: Absence of Fall and Fall-Related Injury   Start Date: 05/18/2021  Expected End Date: 05/13/2022  This Visit's Progress: On track  Note:   Current Barriers:  Knowledge Deficits related to fall precautions in patient with  Decreased adherence to prescribed treatment for fall prevention Unable to independently manage safety and prevent falls in his environment  Lacks social connections Unable to perform IADLs independently Does not contact provider office for questions/concerns Knowledge Deficits related to fall prevention and safety  Care Coordination needs related to needs in the home  in a patient with high fall risk and multiple chronic conditions.  Chronic Disease Management support and education needs related to preventing falls in patient with unsteady gait, high fall risk, and multiple chronic conditions.  Clinical Goal(s):  patient will demonstrate improved adherence to prescribed treatment plan for decreasing falls as evidenced by patient reporting and review of EMR patient will verbalize using fall risk reduction strategies discussed patient will not experience additional falls patient will verbalize understanding of plan for management of falls and safety in the home patient  will work with [facility] and CCM team  to address needs related to falls prevention and safety patient will demonstrate a decrease in falls exacerbations patient will take all medications exactly as prescribed and will call provider for medication related questions patient will demonstrate improved health management independence patient will demonstrate understanding of rationale for each prescribed medication patient will work with care guides (community agency) to give resources in [place] to assist with in home services to help the patient with his ADL needs  the patient will demonstrate ongoing self health care management ability Interventions:  Collaboration with Jearld Fenton, NP regarding development and update of comprehensive plan of care as evidenced by provider attestation and co-signature Inter-disciplinary care team collaboration (see longitudinal plan of care) Provided written and verbal education re: Potential causes of falls and Fall prevention strategies Reviewed medications and discussed potential side effects of medications such as dizziness and frequent urination Assessed for s/s of orthostatic hypotension Assessed for falls since last encounter. 05-18-2021: The patient states he did not fall to the floor but fell on his bed. Is experiencing right hip pain and discomfort. Xray today.  Assessed patients knowledge of fall risk prevention secondary to previously provided education. Assessed working status of life alert bracelet and patient adherence Provided patient information for fall alert systems- 05-18-2021- has an active fall alert system  Evaluation of current treatment plan related to fall prevention and safety  and patient's adherence to plan as established by provider. Advised patient to call the office for new falls or injuries  Provided  education to patient re: being safe in home environment, using DME, keeping life alert system active, and following recommendations  of the provider.  Care Guide referral for resources in [facility] to assist with in home help one day a week for ADLs Discussed plans with patient for ongoing care management follow up and provided patient with direct contact information for care management team Self-Care Deficits:  Unable to independently prevent falls  Lacks social connections Unable to perform IADLs independently Does not contact provider office for questions/concerns Patient Goals:  - Utilize walker or cane  (assistive device) appropriately with all ambulation - De-clutter walkways - Change positions slowly - Wear secure fitting shoes at all times with ambulation - Utilize home lighting for dim lit areas - Demonstrate self and pet awareness at all times Follow Up Plan: Telephone follow up appointment with care management team member scheduled for: 06-01-2021 at 1 pm    Task: Identify and Manage Contributors to Fall Risk Completed 05/18/2021  Outcome: Positive  Note:   Care Management Activities:    - activities of daily living skills assessed - assistive or adaptive device use encouraged - barriers to physical activity or exercise addressed - barriers to physical activity or exercise identified - barriers to safety identified - cognition assessed - cognitive-stimulating activities promoted - counseling by pharmacist provided - fall prevention plan reviewed and updated - fear of falling, loss of independence and pain acknowledged - medication list reviewed - modification of home and work environment promoted - vision and/or hearing aid use promoted    Notes:     Care Plan : RNCM: Depression (Adult)  Updates made by Vanita Ingles since 05/18/2021 12:00 AM     Problem: RNCM: Depression Identification (Depression)   Priority: High  Onset Date: 05/18/2021     Long-Range Goal: RNCM: Depressive Symptoms Identified   Start Date: 05/18/2021  Expected End Date: 05/18/2022  This Visit's Progress: On track   Priority: High  Note:   Current Barriers:  Ineffective Self Health Maintenance in a patient with Depression Unable to independently manage depression due to multiple chronic conditions impacting his care Lacks social connections Does not contact provider office for questions/concerns Clinical Goal(s):  Collaboration with Jearld Fenton, NP regarding development and update of comprehensive plan of care as evidenced by provider attestation and co-signature Inter-disciplinary care team collaboration (see longitudinal plan of care) patient will work with care management team to address care coordination and chronic disease management needs related to Disease Management Educational Needs Mental Health Counseling   Interventions:  Evaluation of current treatment plan related to Depression, Mental Health Concerns  self-management and patient's adherence to plan as established by provider. Collaboration with Jearld Fenton, NP regarding development and update of comprehensive plan of care as evidenced by provider attestation       and co-signature Inter-disciplinary care team collaboration (see longitudinal plan of care) Discussed plans with patient for ongoing care management follow up and provided patient with direct contact information for care management team Self Care Activities:  Patient verbalizes understanding of plan to effectively manage depression  Self administers medications as prescribed Attends all scheduled provider appointments Calls pharmacy for medication refills Attends church or other social activities Performs ADL's independently Performs IADL's independently Calls provider office for new concerns or questions Patient Goals:  Follow Up Plan: Telephone follow up appointment with care management team member scheduled for:  06-01-2021 at 1 pm    Task: Identify Depressive Symptoms and Facilitate Treatment  Completed 05/18/2021  Outcome: Positive  Note:   Care Management  Activities:    - anxiety screen reviewed - counseling by pharmacist provided - depression screen reviewed - medication list reviewed - mental health treatment arranged - participation in psychiatric services encouraged - substance use assessed - substance use risk screen reviewed    Notes:     Care Plan : RNCM: Chronic Pain (Adult)  Updates made by Vanita Ingles since 05/18/2021 12:00 AM     Problem: RNCM: Chronic Pain Management (Chronic Pain)   Priority: High  Onset Date: 05/18/2021     Long-Range Goal: RNCM: Chronic Pain Managed   Start Date: 05/18/2021  Expected End Date: 05/18/2022  This Visit's Progress: On track  Priority: High  Note:   Current Barriers:  Knowledge Deficits related to managing acute/chronic pain Non-adherence to scheduled provider appointments Non-adherence to prescribed medication regimen Difficulty obtaining medications Chronic Disease Management support and education needs related to chronic pain Unable to independently manage pain and discomfort in right hip Lacks social connections Does not contact provider office for questions/concerns Nurse Case Manager Clinical Goal(s):  patient will verbalize understanding of plan for managing pain patient will work  with Consulting civil engineer to address new pain concerns and needs  patient will attend all scheduled medical appointments: having xray today, follow up as recommended by the pcp patient will demonstrate use of different relaxation  skills and/or diversional activities to assist with pain reduction (distraction, imagery, relaxation, massage, acupressure, TENS, heat, and cold application patient will report pain at a level less than 3 to 4 on a 10-10 rating scale patient will use pharmacological and nonpharmacological pain relief strategies patient will verbalize acceptable level of pain relief and ability to engage in desired activities patient will engage in desired activities without an increase in  pain level Interventions:  Collaboration with Jearld Fenton, NP regarding development and update of comprehensive plan of care as evidenced by provider attestation and co-signature Inter-disciplinary care team collaboration (see longitudinal plan of care) - careful application of heat or ice encouraged - complementary therapy use encouraged - deep breathing, relaxation and mindfulness use promoted - effectiveness of pharmacologic therapy monitored - medication-induced side effects managed - misuse of pain medication assessed - motivation and barriers to change assessed and addressed - mutually acceptable comfort goal set - pain assessed - pain treatment goals reviewed - participation in physical therapy encouraged - premedication prior to activity encouraged Evaluation of current treatment plan related to pain in right hip and patient's adherence to plan as established by provider. Advised patient to call the office for changes in level or intensity of pain Provided education to patient re: effective pain management, alternative methods for pain control, safety and calling the office for changes. Reviewed medications with patient and discussed compliance  Discussed plans with patient for ongoing care management follow up and provided patient with direct contact information for care management team Allow patient to maintain a diary of pain ratings, timing, precipitating events, medications, treatments, and what works best to relieve pain,  Refer to support groups and self-help groups Educate patient about the use of pharmacological interventions for pain management- antianxiety, antidepressants, NSAIDS, opioid analgesics,  Explain the importance of lifestyle modifications to effective pain management  Patient Goals/Self Care Activities:   Self-administers medications as prescribed Attends all scheduled provider appointments Calls pharmacy for medication refills Calls provider office  for new concerns or questions Follow Up Plan: Telephone follow up appointment with care management  team member scheduled for:06-01-2021 at 1 pm      Task: Alleviate Barriers to Chronic Pain Management Completed 05/18/2021  Outcome: Positive  Note:   Care Management Activities:    - careful application of heat or ice encouraged - complementary therapy use encouraged - deep breathing, relaxation and mindfulness use promoted - effectiveness of pharmacologic therapy monitored - enrollment in pain education program arranged - medication-induced side effects managed - misuse of pain medication assessed - motivation and barriers to change assessed and addressed - mutually acceptable comfort goal set - pain assessed - pain treatment goals reviewed    Notes:      Plan:Telephone follow up appointment with care management team member scheduled for:  06-01-2021 at 1 pm  [place], MSN, [place] Mobile: [phone]

## 2021-05-18 NOTE — Patient Instructions (Signed)
Visit Information  PATIENT GOALS:  Goals Addressed             This Visit's Progress    RNCM: Manage Chronic Pain       Timeframe:  Long-Range Goal Priority:  High Start Date:          05-18-2021                   Expected End Date:     05-18-2022                  Follow Up Date 06-01-2021   - call for medicine refill 2 or 3 days before it runs out - develop a personal pain management plan - keep track of prescription refills - plan exercise or activity when pain is best controlled - prioritize tasks for the day - track times pain is worst and when it is best - track what makes the pain worse and what makes it better - use ice or heat for pain relief    Why is this important?   Day-to-day life can be hard when you have chronic pain.  Pain medicine is just one piece of the treatment puzzle.  You can try these action steps to help you manage your pain.    Notes: 05-18-2021: Saw pcp in the office today for right hip pain. Denies any acute distress but states something new is going on and he wants to know what can help. Review of safety. Is using walker with seat. Will continue to monitor.      RNCM: Prevent Falls and Injury       Follow Up Date 06/01/2021    - add more outdoor lighting - always use handrails on the stairs - always wear low-heeled or flat shoes or slippers with nonskid soles - call the doctor if I am feeling too drowsy - install bathroom grab bars - join an exercise group in my community - keep a flashlight by the bed - keep my cell phone with me always - learn how to get back up if I fall - make an emergency alert plan in case I fall - pick up clutter from the floors - use a nonslip pad with throw rugs, or remove them completely - use a cane or walker - use a nightlight in the bathroom - wear my glasses and/or hearing aid - attend therapy    Why is this important?   Most falls happen when it is hard for you to walk safely. Your balance may be off  because of an illness. You may have pain in your knees, hip or other joints.  You may be overly tired or taking medicines that make you sleepy. You may not be able to see or hear clearly.  Falls can lead to broken bones, bruises or other injuries.  There are things you can do to help prevent falling.     Notes: 05-18-2021: The patient had a fall recently to his bed. The patient states he is being careful but is experiencing hip pain and discomfort. Is having xrays in the office today for evaluation of hip pain and discomfort. Review of safety measures.      RNCM: Track and Manage My Symptoms-Depression       Timeframe:  Long-Range Goal Priority:  High Start Date:       05-18-2021  Expected End Date:       05-18-2022                Follow Up Date 06/01/2021    - avoid negative self-talk - develop a personal safety plan - develop a plan to deal with triggers like holidays, anniversaries - have a plan for how to handle bad days - spend time or talk with others at least 2 to 3 times per week - spend time or talk with others every day - watch for early signs of feeling worse    Why is this important?   Keeping track of your progress will help your treatment team find the right mix of medicine and therapy for you.  Write in your journal every day.  Day-to-day changes in depression symptoms are normal. It may be more helpful to check your progress at the end of each week instead of every day.     Notes: 05-18-2021: The patient states that he is down about all these health issues he has going on. He states he wants to feel better but seems like everything is happening.  He is thankful for the support of his providers and the CCM team. Reflective listening and support given. The patient knows the CCM team is here to help support him and meet his healthcare needs. Will continue to monitor for changes.         The patient verbalized understanding of instructions, educational  materials, and care plan provided today and declined offer to receive copy of patient instructions, educational materials, and care plan.   Telephone follow up appointment with care management team member scheduled for: 06-01-2021 at 1 pm  Noreene Larsson RN, MSN, [place] Mobile: [phone]

## 2021-05-19 LAB — LIPID PANEL
Cholesterol: 116 mg/dL (ref <200)
HDL: 45 mg/dL (ref >=40)
LDL Cholesterol (Calc): 53 mg/dL (calc)
Non-HDL Cholesterol (Calc): 71 mg/dL (calc) (ref <130)
Total CHOL/HDL Ratio: 2.6 (calc) (ref <5.0)
Triglycerides: 101 mg/dL (ref <150)

## 2021-05-19 LAB — HEMOGLOBIN A1C
Hgb A1c MFr Bld: 6.6 % of total Hgb — ABNORMAL HIGH (ref <5.7)
Mean Plasma Glucose: 143 mg/dL
eAG (mmol/L): 7.9 mmol/L

## 2021-05-20 ENCOUNTER — Telehealth: Payer: Self-pay

## 2021-05-20 DIAGNOSIS — G8929 Other chronic pain: Secondary | ICD-10-CM

## 2021-05-20 DIAGNOSIS — R2689 Other abnormalities of gait and mobility: Secondary | ICD-10-CM

## 2021-05-20 DIAGNOSIS — M79651 Pain in right thigh: Secondary | ICD-10-CM

## 2021-05-20 NOTE — Telephone Encounter (Signed)
The pt called back and was notified of his lab, lumbar spine xray, and hip xray results. He verbalize understanding and wish to proceed with the physical therapy.

## 2021-05-21 ENCOUNTER — Telehealth: Payer: Self-pay | Admitting: Podiatry

## 2021-05-21 NOTE — Addendum Note (Signed)
Addended by: Jearld Fenton on: 05/21/2021 11:08 AM   Modules accepted: Orders

## 2021-05-21 NOTE — Telephone Encounter (Signed)
Referral to PT placed

## 2021-05-21 NOTE — Telephone Encounter (Signed)
Faxed diabetic shoe documents to pcp by hand.

## 2021-05-21 NOTE — Telephone Encounter (Signed)
Patient called wanting to know if we have receive a request form for his shoes to be made. I have reached out to [facility] in our orthotic department to see if she has received anything an she states no. I called Nathaniel Little back to let him know we haven't received any paperwork and we will get in touch with his Primary doctor and call him back once we receive his paperwork.

## 2021-05-22 ENCOUNTER — Encounter: Payer: Self-pay | Admitting: Internal Medicine

## 2021-05-22 ENCOUNTER — Telehealth: Payer: Self-pay

## 2021-05-22 DIAGNOSIS — Z6828 Body mass index (BMI) 28.0-28.9, adult: Secondary | ICD-10-CM | POA: Insufficient documentation

## 2021-05-22 DIAGNOSIS — E6609 Other obesity due to excess calories: Secondary | ICD-10-CM | POA: Insufficient documentation

## 2021-05-22 DIAGNOSIS — E663 Overweight: Secondary | ICD-10-CM

## 2021-05-22 HISTORY — DX: Overweight: E66.3

## 2021-05-22 NOTE — Assessment & Plan Note (Signed)
A1c today We will check urine microalbumin at next visit Continue Lantus and metformin Encouraged low-carb diet and exercise for weight loss Encourage routine eye exams Foot exam today Encouraged him to get a flu shot in the fall Pneumonia and COVID-vaccine UTD

## 2021-05-22 NOTE — Assessment & Plan Note (Signed)
CBC today Not currently taking oral iron

## 2021-05-22 NOTE — Assessment & Plan Note (Signed)
Lipid profile today Encouraged him to consume a low-fat diet Continue Zetia

## 2021-05-22 NOTE — Assessment & Plan Note (Signed)
Encouraged him to avoid foods that trigger his reflux Encourage weight loss as this can help reduce reflux symptoms Continue pantoprazole

## 2021-05-22 NOTE — Patient Instructions (Signed)
Hip Exercises Ask your health care provider which exercises are safe for you. Do exercises exactly as told by your health care provider and adjust them as directed. It is normal to feel mild stretching, pulling, tightness, or discomfort as you do these exercises. Stop right away if you feel sudden pain or your pain gets worse. Do not begin these exercises until told by your health care provider. Stretching and range-of-motion exercises These exercises warm up your muscles and joints and improve the movement and flexibility of your hip. These exercises also help to relieve pain, numbness, and tingling. You may be asked to limit your range of motion if you had a hip replacement. Talk to your health care provider about these restrictions. Hamstrings, supine  Lie on your back (supine position). Loop a belt or towel over the ball of your left / right foot. The ball of your foot is on the walking surface, right under your toes. Straighten your left / right knee and slowly pull on the belt or towel to raise your leg until you feel a gentle stretch behind your knee (hamstring). Do not let your knee bend while you do this. Keep your other leg flat on the floor. Hold this position for __________ seconds. Slowly return your leg to the starting position. Repeat __________ times. Complete this exercise __________ times a day. Hip rotation  Lie on your back on a firm surface. With your left / right hand, gently pull your left / right knee toward the shoulder that is on the same side of the body. Stop when your knee is pointing toward the ceiling. Hold your left / right ankle with your other hand. Keeping your knee steady, gently pull your left / right ankle toward your other shoulder until you feel a stretch in your buttocks. Keep your hips and shoulders firmly planted while you do this stretch. Hold this position for __________ seconds. Repeat __________ times. Complete this exercise __________ times a  day. Seated stretch This exercise is sometimes called hamstrings and adductors stretch. Sit on the floor with your legs stretched wide. Keep your knees straight during this exercise. Keeping your head and back in a straight line, bend at your waist to reach for your left foot (position A). You should feel a stretch in your right inner thigh (adductors). Hold this position for __________ seconds. Then slowly return to the upright position. Keeping your head and back in a straight line, bend at your waist to reach forward (position B). You should feel a stretch behind both of your thighs and knees (hamstrings). Hold this position for __________ seconds. Then slowly return to the upright position. Keeping your head and back in a straight line, bend at your waist to reach for your right foot (position C). You should feel a stretch in your left inner thigh (adductors). Hold this position for __________ seconds. Then slowly return to the upright position. Repeat __________ times. Complete this exercise __________ times a day. Lunge This exercise stretches the muscles of the hip (hip flexors). Place your left / right knee on the floor and bend your other knee so that is directly over your ankle. You should be half-kneeling. Keep good posture with your head over your shoulders. Tighten your buttocks to point your tailbone downward. This will prevent your back from arching too much. You should feel a gentle stretch in the front of your left / right thigh and hip. If you do not feel a stretch, slide your other foot   forward slightly and then slowly lunge forward with your chest up until your knee once again lines up over your ankle. Make sure your tailbone continues to point downward. Hold this position for __________ seconds. Slowly return to the starting position. Repeat __________ times. Complete this exercise __________ times a day. Strengthening exercises These exercises build strength and endurance  in your hip. Endurance is the ability to use your muscles for a long time, even after they get tired. Bridge This exercise strengthens the muscles of your hip (hip extensors). Lie on your back on a firm surface with your knees bent and your feet flat on the floor. Tighten your buttocks muscles and lift your bottom off the floor until the trunk of your body and your hips are level with your thighs. Do not arch your back. You should feel the muscles working in your buttocks and the back of your thighs. If you do not feel these muscles, slide your feet 1-2 inches (2.5-5 cm) farther away from your buttocks. Hold this position for __________ seconds. Slowly lower your hips to the starting position. Let your muscles relax completely between repetitions. Repeat __________ times. Complete this exercise __________ times a day. Straight leg raises, side-lying This exercise strengthens the muscles that move the hip joint away from the center of the body (hip abductors). Lie on your side with your left / right leg in the top position. Lie so your head, shoulder, hip, and knee line up. You may bend your bottom knee slightly to help you balance. Roll your hips slightly forward, so your hips are stacked directly over each other and your left / right knee is facing forward. Leading with your heel, lift your top leg 4-6 inches (10-15 cm). You should feel the muscles in your top hip lifting. Do not let your foot drift forward. Do not let your knee roll toward the ceiling. Hold this position for __________ seconds. Slowly return to the starting position. Let your muscles relax completely between repetitions. Repeat __________ times. Complete this exercise __________ times a day. Straight leg raises, side-lying This exercise strengthens the muscles that move the hip joint toward the center of the body (hip adductors). Lie on your side with your left / right leg in the bottom position. Lie so your head, shoulder,  hip, and knee line up. You may place your upper foot in front to help you balance. Roll your hips slightly forward, so your hips are stacked directly over each other and your left / right knee is facing forward. Tense the muscles in your inner thigh and lift your bottom leg 4-6 inches (10-15 cm). Hold this position for __________ seconds. Slowly return to the starting position. Let your muscles relax completely between repetitions. Repeat __________ times. Complete this exercise __________ times a day. Straight leg raises, supine This exercise strengthens the muscles in the front of your thigh (quadriceps). Lie on your back (supine position) with your left / right leg extended and your other knee bent. Tense the muscles in the front of your left / right thigh. You should see your kneecap slide up or see increased dimpling just above your knee. Keep these muscles tight as you raise your leg 4-6 inches (10-15 cm) off the floor. Do not let your knee bend. Hold this position for __________ seconds. Keep these muscles tense as you lower your leg. Relax the muscles slowly and completely between repetitions. Repeat __________ times. Complete this exercise __________ times a day. Hip abductors, standing This   exercise strengthens the muscles that move the leg and hip joint away from the center of the body (hip abductors). Tie one end of a rubber exercise band or tubing to a secure surface, such as a chair, table, or pole. Loop the other end of the band or tubing around your left / right ankle. Keeping your ankle with the band or tubing directly opposite the secured end, step away until there is tension in the tubing or band. Hold on to a chair, table, or pole as needed for balance. Lift your left / right leg out to your side. While you do this: Keep your back upright. Keep your shoulders over your hips. Keep your toes pointing forward. Make sure to use your hip muscles to slowly lift your leg. Do not  tip your body or forcefully lift your leg. Hold this position for __________ seconds. Slowly return to the starting position. Repeat __________ times. Complete this exercise __________ times a day. Squats This exercise strengthens the muscles in the front of your thigh (quadriceps). Stand in a door frame so your feet and knees are in line with the frame. You may place your hands on the frame for balance. Slowly bend your knees and lower your hips like you are going to sit in a chair. Keep your lower legs in a straight-up-and-down position. Do not let your hips go lower than your knees. Do not bend your knees lower than told by your health care provider. If your hip pain increases, do not bend as low. Hold this position for ___________ seconds. Slowly push with your legs to return to standing. Do not use your hands to pull yourself to standing. Repeat __________ times. Complete this exercise __________ times a day. This information is not intended to replace advice given to you by your health care provider. Make sure you discuss any questions you have with your health care provider. Document Revised: 04/26/2019 Document Reviewed: 08/01/2018 Elsevier Patient Education  2022 [employer]  

## 2021-05-22 NOTE — Assessment & Plan Note (Signed)
Asymptomatic at this time Continue aspirin and Zetia

## 2021-05-22 NOTE — Assessment & Plan Note (Signed)
Not medicated Will monitor

## 2021-05-22 NOTE — Assessment & Plan Note (Signed)
Persistent edema, cough and shortness of breath He refuses diuretics secondary to urinary frequency Continue clonidine and carvedilol as prescribed He has a follow-up with cardiology scheduled

## 2021-05-22 NOTE — Assessment & Plan Note (Signed)
Encouraged weight loss as this can help reduce sleep apnea symptoms Encourage compliance with CPAP

## 2021-05-22 NOTE — Assessment & Plan Note (Signed)
Encourage diet and exercise for weight loss 

## 2021-05-22 NOTE — Assessment & Plan Note (Signed)
X-ray lumbar spine today

## 2021-05-22 NOTE — Telephone Encounter (Signed)
Pt returned Millie's call/ please advise

## 2021-05-22 NOTE — Assessment & Plan Note (Signed)
Asymptomatic Continue aspirin

## 2021-05-22 NOTE — Telephone Encounter (Signed)
   Telephone encounter was:  Unsuccessful.  05/22/2021 Name: Nathaniel Little MRN: [id number] DOB: 06/06/1946  Unsuccessful outbound call made today to assist with:   in home care resources.  Outreach Attempt:  1st Attempt  A HIPAA compliant voice message was left requesting a return call.  Instructed patient to call back at [phone].  Sephiroth Mcluckie, AAS Paralegal, [facility]  [address], [place] [address] ??[email or web address]  ?? [phone]   [email or web address]

## 2021-05-22 NOTE — Assessment & Plan Note (Signed)
Continue Symbicort and albuterol Continue oxygen at night

## 2021-05-22 NOTE — Assessment & Plan Note (Signed)
Continue clonidine and carvedilol Reinforced DASH diet and exercise for weight loss Kidney function reviewed

## 2021-05-25 ENCOUNTER — Telehealth: Payer: Self-pay

## 2021-05-25 ENCOUNTER — Other Ambulatory Visit: Payer: Self-pay

## 2021-05-25 DIAGNOSIS — K219 Gastro-esophageal reflux disease without esophagitis: Secondary | ICD-10-CM

## 2021-05-25 MED ORDER — PANTOPRAZOLE SODIUM 40 MG PO TBEC: 40.0000 mg | DELAYED_RELEASE_TABLET | Freq: Two times a day (BID) | ORAL | 11 refills | Status: DC

## 2021-05-25 NOTE — Telephone Encounter (Signed)
   Telephone encounter was:  Unsuccessful.  05/25/2021 Name: Nathaniel Little MRN: [id number] DOB: 10-Jul-1946  Unsuccessful outbound call made today to assist with:   in-home care.  Outreach Attempt:  2nd Attempt  A HIPAA compliant voice message was left requesting a return call.  Instructed patient to call back at [phone].  Ilyana Manuele, AAS Paralegal, [facility]  [address], [place] [address] ??[email or web address]  ?? [phone]   [email or web address]

## 2021-05-25 NOTE — Telephone Encounter (Signed)
Thanks Pam.  I left messages on his cell voicemail [phone] today and on 05/22/21.

## 2021-05-28 ENCOUNTER — Telehealth: Payer: Self-pay

## 2021-05-28 NOTE — Telephone Encounter (Signed)
Appt scheduled for 06/26/2021 at 10:00. Patient is aware and voiced his understanding.  Nothing further needed at this time.

## 2021-05-28 NOTE — Telephone Encounter (Signed)
-----   Message from Tyler Pita, MD sent at 05/27/2021  5:35 PM EDT ----- He was last seen in May and at the time he was on 2 L/min.  as he had just recently been discharged from the hospital.  As I stated in my note he was supposed to come back in July and he would be then requalified at that time.  He did not follow-up in July.  He will need to call for an appointment.  We can certainly requalify him at that time.  Dr. Darnell Level. ----- Message ----- From: Vanita Ingles Sent: 05/18/2021   2:56 PM EDT To: Jearld Fenton, NP, Tyler Pita, MD  Hello, Rollene Fare, Dr, Patsey Berthold and covering provider, The patient was in to see [facility] today. I am the [facility] for the practice. He was inquiring about portable oxygen. He says he has not had a walk test. I explained he would need to have this done to see if the insurance would pay for him to have portable oxygen. This is something he feels he needs. Please advise as to the best course of action for this patient. Do I need to have him call your office or what is the process.  Thank you for your help, Noreene Larsson, RN, MSN, [place] [phone]

## 2021-06-01 ENCOUNTER — Telehealth: Payer: Self-pay

## 2021-06-01 ENCOUNTER — Ambulatory Visit: Payer: Self-pay

## 2021-06-01 ENCOUNTER — Telehealth: Payer: Self-pay | Admitting: Podiatry

## 2021-06-01 ENCOUNTER — Telehealth: Payer: Medicare Other | Admitting: General Practice

## 2021-06-01 DIAGNOSIS — J449 Chronic obstructive pulmonary disease, unspecified: Secondary | ICD-10-CM

## 2021-06-01 DIAGNOSIS — R2689 Other abnormalities of gait and mobility: Secondary | ICD-10-CM

## 2021-06-01 DIAGNOSIS — M25551 Pain in right hip: Secondary | ICD-10-CM

## 2021-06-01 DIAGNOSIS — M79651 Pain in right thigh: Secondary | ICD-10-CM

## 2021-06-01 DIAGNOSIS — I10 Essential (primary) hypertension: Secondary | ICD-10-CM

## 2021-06-01 DIAGNOSIS — F324 Major depressive disorder, single episode, in partial remission: Secondary | ICD-10-CM

## 2021-06-01 DIAGNOSIS — G8929 Other chronic pain: Secondary | ICD-10-CM

## 2021-06-01 DIAGNOSIS — E1142 Type 2 diabetes mellitus with diabetic polyneuropathy: Secondary | ICD-10-CM

## 2021-06-01 NOTE — Patient Instructions (Signed)
Visit Information  PATIENT GOALS:  Goals Addressed             This Visit's Progress    RNCM: Manage Chronic Pain       Timeframe:  Long-Range Goal Priority:  High Start Date:          05-18-2021                   Expected End Date:     05-18-2022                  Follow Up Date 07-13-2021   - call for medicine refill 2 or 3 days before it runs out - develop a personal pain management plan - keep track of prescription refills - plan exercise or activity when pain is best controlled - prioritize tasks for the day - track times pain is worst and when it is best - track what makes the pain worse and what makes it better - use ice or heat for pain relief    Why is this important?   Day-to-day life can be hard when you have chronic pain.  Pain medicine is just one piece of the treatment puzzle.  You can try these action steps to help you manage your pain.    Notes: 05-18-2021: Saw pcp in the office today for right hip pain. Denies any acute distress but states something new is going on and he wants to know what can help. Review of safety. Is using walker with seat. Will continue to monitor. 06-01-2021: Denies any pain issues today. States it is feeling better. Is hopeful the diabetic supports will help. Will continue to monitor.     RNCM: Prevent Falls and Injury       Follow Up Date 07/13/2021    - add more outdoor lighting - always use handrails on the stairs - always wear low-heeled or flat shoes or slippers with nonskid soles - call the doctor if I am feeling too drowsy - install bathroom grab bars - join an exercise group in my community - keep a flashlight by the bed - keep my cell phone with me always - learn how to get back up if I fall - make an emergency alert plan in case I fall - pick up clutter from the floors - use a nonslip pad with throw rugs, or remove them completely - use a cane or walker - use a nightlight in the bathroom - wear my glasses and/or hearing  aid - attend therapy    Why is this important?   Most falls happen when it is hard for you to walk safely. Your balance may be off because of an illness. You may have pain in your knees, hip or other joints.  You may be overly tired or taking medicines that make you sleepy. You may not be able to see or hear clearly.  Falls can lead to broken bones, bruises or other injuries.  There are things you can do to help prevent falling.     Notes: 05-18-2021: The patient had a fall recently to his bed. The patient states he is being careful but is experiencing hip pain and discomfort. Is having xrays in the office today for evaluation of hip pain and discomfort. Review of safety measures. 06-01-2021: Denies any new falls. The patient states that he is trying to find out the status of his diabetic shoes. He has been fitted for them but he has not heard  back from them. Gave the RNCM the number to the place to get his shoes. [phone].  Will follow up with [facility]. [facility] in [place], [place].  Call placed to [facility] and spoke with Dawn to check the status of his diabetic shoes.      RNCM: Track and Manage My Symptoms-Depression       Timeframe:  Long-Range Goal Priority:  High Start Date:       05-18-2021                      Expected End Date:       05-18-2022                Follow Up Date 07-13-2021    - avoid negative self-talk - develop a personal safety plan - develop a plan to deal with triggers like holidays, anniversaries - have a plan for how to handle bad days - spend time or talk with others at least 2 to 3 times per week - spend time or talk with others every day - watch for early signs of feeling worse    Why is this important?   Keeping track of your progress will help your treatment team find the right mix of medicine and therapy for you.  Write in your journal every day.  Day-to-day changes in depression symptoms are normal. It may be more helpful to check your progress  at the end of each week instead of every day.     Notes: 05-18-2021: The patient states that he is down about all these health issues he has going on. He states he wants to feel better but seems like everything is happening.  He is thankful for the support of his providers and the CCM team. Reflective listening and support given. The patient knows the CCM team is here to help support him and meet his healthcare needs. Will continue to monitor for changes. 06-01-2021: The patient had called and left VM and had also an incoming call from the patient. The patient is trying to get help with several things he has going on. The patient states that he wants to make sure  he is keeping his appointments and following through with the things he needs. Reviewed with him referrals that had been placed and ask him to follow through with calling the dentist and also keeping his appointment with the specialist so he could get his oxygen renewed. The patient verbalized understanding. Will continue to monitor.         The patient verbalized understanding of instructions, educational materials, and care plan provided today and declined offer to receive copy of patient instructions, educational materials, and care plan.   Telephone follow up appointment with care management team member scheduled for: 07-13-2021 at 1:45 pm  [place], MSN, [place] Mobile: [phone]

## 2021-06-01 NOTE — Telephone Encounter (Signed)
   Telephone encounter was:  Unsuccessful.  06/01/2021 Name: Nathaniel Little MRN: [id number] DOB: Oct 03, 1946  Unsuccessful outbound call made today to assist with:  Unable to leave messag on home phone voicemail full.  Left message on cellphone voicemail to return my call regarding resources for in-home care.  Letter saved in Epic.  Outreach Attempt:  3rd Attempt.  Referral closed unable to contact patient.  A HIPAA compliant voice message was left requesting a return call.  Instructed patient to call back at [phone].  Derrick Tiegs, AAS Paralegal, [facility]  [address], [place] [address] ??[email or web address]  ?? [phone]   [email or web address]

## 2021-06-01 NOTE — Chronic Care Management (AMB) (Signed)
Chronic Care Management   CCM RN Visit Note  06/01/2021 Name: Nathaniel Little MRN: [id number] DOB: 1946/07/28  Subjective: Nathaniel Little is a 75 y.o. year old male who is a primary care patient of Jearld Fenton, NP. The care management team was consulted for assistance with disease management and care coordination needs.    Engaged with patient by telephone for follow up visit in response to provider referral for case management and/or care coordination services.   Consent to Services:  The patient was given information about Chronic Care Management services, agreed to services, and gave verbal consent prior to initiation of services.  Please see initial visit note for detailed documentation.   Patient agreed to services and verbal consent obtained.   Assessment: Review of patient past medical history, allergies, medications, health status, including review of consultants reports, laboratory and other test data, was performed as part of comprehensive evaluation and provision of chronic care management services.   SDOH (Social Determinants of Health) assessments and interventions performed:    CCM Care Plan  Allergies  Allergen Reactions   Codeine Itching    Outpatient Encounter Medications as of 06/01/2021  Medication Sig   albuterol (VENTOLIN HFA) 108 (90 Base) MCG/ACT inhaler Inhale 2 puffs into the lungs every 6 (six) hours as needed for wheezing or shortness of breath.   aspirin EC 81 MG tablet Take 81 mg by mouth daily.   budesonide-formoterol (SYMBICORT) 80-4.5 MCG/ACT inhaler Inhale 2 puffs into the lungs 2 (two) times daily.   carvedilol (COREG) 6.25 MG tablet Take 1 tablet (6.25 mg total) by mouth 2 (two) times daily with a meal.   cloNIDine (CATAPRES) 0.1 MG tablet Take 1 tablet (0.1 mg total) by mouth 2 (two) times daily.   ezetimibe (ZETIA) 10 MG tablet Take 1 tablet (10 mg total) by mouth daily.   gabapentin (NEURONTIN) 100 MG capsule Take 1-2 capsules (100-200 mg total)  by mouth at bedtime.   insulin glargine (LANTUS) 100 unit/mL SOPN Inject 20 Units into the skin at bedtime.   ipratropium-albuterol (DUONEB) 0.5-2.5 (3) MG/3ML SOLN Take 3 mLs by nebulization every 6 (six) hours as needed (Asthma).   metFORMIN (GLUCOPHAGE) 500 MG tablet Take 1,000 mg by mouth daily with breakfast. 2 in the morning 1 at night.   pantoprazole (PROTONIX) 40 MG tablet Take 1 tablet (40 mg total) by mouth 2 (two) times daily.   potassium chloride (KLOR-CON) 10 MEQ tablet Take 2 tablets (20 mEq total) by mouth daily.   No facility-administered encounter medications on file as of 06/01/2021.    Patient Active Problem List   Diagnosis Date Noted   Class 1 obesity due to excess calories with serious comorbidity and body mass index (BMI) of 31.0 to 31.9 in adult 05/22/2021   Iron deficiency anemia 01/20/2021   Insomnia 01/20/2021   Malignant neoplasm of lower lobe of left lung ([place]) 12/14/2020   DM type 2 with diabetic peripheral neuropathy ([place]) 06/13/2020   Tremor 06/13/2020   Erectile dysfunction due to arterial insufficiency 09/24/2019   Lumbar degenerative disc disease 06/25/2019   Chronic heart failure with preserved ejection fraction ([place]) 01/19/2019   Severe aortic stenosis    Hyperlipidemia 07/20/2018   Claudication in peripheral vascular disease ([place]) 08/22/2017   Aortic atherosclerosis ([place]) 07/25/2017   Chronic obstructive pulmonary disease ([place]) 01/07/2017   GERD (gastroesophageal reflux disease) 01/07/2017   Essential hypertension 01/07/2017   Sleep apnea with use of continuous positive airway pressure (CPAP) 01/06/2017  Conditions to be addressed/monitored:HTN, COPD, DMII, Depression, and chronic pain, fall and safety prevention  Care Plan : RNCM: COPD (Adult)  Updates made by Vanita Ingles, RN since 06/01/2021 12:00 AM     Problem: RNCM: Psychological Adjustment to Diagnosis (COPD)   Priority: Medium     Long-Range Goal: RNCM: Management of COPD    Start Date: 10/20/2020  Expected End Date: 02/23/2022  This Visit's Progress: On track  Recent Progress: On track  Priority: Medium  Note:   Current Barriers:  Knowledge deficits related to basic understanding of COPD disease process Knowledge deficits related to basic COPD self care/management Knowledge deficit related to basic understanding of how to use inhalers and how inhaled medications work Knowledge deficit related to importance of energy conservation Limited Social Support Unable to independently manage COPD Lacks social connections Does not contact provider office for questions/concerns  Case Manager Clinical Goal(s):  patient will report using inhalers as prescribed including rinsing mouth after use  patient will report utilizing pursed lip breathing for shortness of breath  patient will be able to verbalize understanding of COPD action plan and when to seek appropriate levels of medical care  patient will engage in lite exercise as tolerated to build/regain stamina and strength and reduce shortness of breath through activity tolerance  patient will verbalize basic understanding of COPD disease process and self care activities patient will not be hospitalized for COPD exacerbation  Interventions:  Collaboration with Jearld Fenton, NP regarding development and update of comprehensive plan of care as evidenced by provider attestation and co-signature Inter-disciplinary care team collaboration (see longitudinal plan of care) Provided patient with basic written and verbal COPD education on self care/management/and exacerbation prevention  Provided patient with COPD action plan and reinforced importance of daily self assessment. 05-18-2021: The patient is asking about portable oxygen because he is experiencing shortness of breath when he goes out to his MD appointments. He has two large tanks but can not put them on his walker or carry them.  Discussed with the patient having a 6  minute walk test and he does not ever remember doing this. Will collaborate with the pcp and pulmonary provider, Dr. Duwayne Heck the process so the patient can see if he qualifies for portable oxygen to be paid through his insurance. Discussed the process and qualifications to get portable oxygen. The patient verbalized understanding. He did let the RNCM know that he gets his oxygen supplied through [facility] and their number is [phone]. The patient denies any acute distress. Paces his activities. 06-01-2021: The patient wanted to know the status of his portable oxygen. Informed the patient he would need to complete a MD visit with the pulmonary provider and advised him of the return message from Dr. Duwayne Heck. The patient does have a follow up scheduled for 06-26-2021 at 10 am.  The patient reminded to keep his appointment for follow up.   Discussed Pulmonary Rehab and offered to assist with referral placement. 05-18-2021: Collaboration with pulmonary specialist related to portable oxygen request. 06-01-2021: Reviewed the information received back from Dr. Patsey Berthold and the patient verbalized understanding.  Provided written and verbal instructions on pursed lip breathing and utilized returned demonstration as teach back Provided instruction about proper use of medications used for management of COPD including inhalers Advised patient to self assesses COPD action plan zone and make appointment with provider if in the yellow zone for 48 hours without improvement. 06-01-2021: The patient is aware of triggers that cause exacerbation. Will  continue to monitor for changes.  Provided patient with education about the role of exercise in the management of COPD Advised patient to engage in light exercise as tolerated 3-5 days a week. 06-01-2021: Limited mobility and high fall risk.  Provided education about and advised patient to utilize infection prevention strategies to reduce risk of respiratory infection.  05-18-2021: Education and support given to the patient on preventing infection. The patient is having lab work and Production manager today. Will continue to monitor for changes. 06-01-2021: Reviewed with the patient information on monitoring for factors that trigger exacerbation. Will continue to monitor for changes.  Patient Goals/Self-Care Activities:  - counseling provided - decision-making supported - depression screen reviewed - emotional support provided - verbalization of feelings encouraged Follow Up Plan: Telephone follow up appointment with care management team member scheduled for: 07-13-2021 at 1:45 pm    Care Plan : RNCM: Hypertension (Adult)  Updates made by Vanita Ingles, RN since 06/01/2021 12:00 AM     Problem: RNCM: Hypertension (Hypertension)   Priority: Medium     Long-Range Goal: RNCM: Hypertension Monitored   Start Date: 10/20/2020  Expected End Date: 02/20/2022  This Visit's Progress: On track  Recent Progress: On track  Priority: Medium  Note:   Objective:  Last practice recorded BP readings:  BP Readings from Last 3 Encounters:  05/18/21 (!) 151/52  04/14/21 (!) 173/68  04/01/21 116/60   Most recent eGFR/CrCl: No results found for: EGFR  No components found for: CRCL Current Barriers:  Knowledge Deficits related to basic understanding of hypertension pathophysiology and self care management Knowledge Deficits related to understanding of medications prescribed for management of hypertension Limited Social Support Unable to independently HTN Does not contact provider office for questions/concerns Case Manager Clinical Goal(s):   patient will verbalize understanding of plan for hypertension management patient will attend all scheduled medical appointments: cardiologist 10-21-2020, pulmonologist 06-26-2021 patient will demonstrate improved adherence to prescribed treatment plan for hypertension as evidenced by taking all medications as prescribed, monitoring and recording  blood pressure as directed, adhering to low sodium/DASH diet patient will demonstrate improved health management independence as evidenced by checking blood pressure as directed and notifying PCP if SBP>160 or DBP > 90, taking all medications as prescribe, and adhering to a low sodium diet as discussed. patient will verbalize basic understanding of hypertension disease process and self health management plan as evidenced by compliance with medications, heart healthy diet, working with CCM team to maintain health and well being. Interventions:  Collaboration with Jearld Fenton, NP regarding development and update of comprehensive plan of care as evidenced by provider attestation and co-signature Inter-disciplinary care team collaboration (see longitudinal plan of care) Evaluation of current treatment plan related to hypertension self management and patient's adherence to plan as established by provider. 05-18-2021: The patient states his blood pressures have been elevated because he has a lot going on with his health right now. The patient is concerned that he just hasn't felt the best. Empathetic listening and support given. The patient had not recently been working with the CCM team as he had not been able to be reached. The patient provided the [facility] with an additional phone number to call. [phone].  Will update record. Will continue to monitor. 06-01-2021: The patient is getting on track with his health and well being. He denies any acute issues with cardiac health. Is compliant with diet and other restrictions. Will continue to monitor.  Provided education to patient re: stroke prevention, s/s  of heart attack and stroke, DASH diet, complications of uncontrolled blood pressure Reviewed medications with patient and discussed importance of compliance. 06-01-2021: Reviewed with the patient the patient has medications and no issues related to medications cost.  Discussed plans with patient for ongoing  care management follow up and provided patient with direct contact information for care management team Advised patient, providing education and rationale, to monitor blood pressure daily and record, calling PCP for findings outside established parameters.  Reviewed scheduled/upcoming provider appointments including:Saw pcp today, has follow up for specialist coming up, having lab work and xrays.  Patient Goals/Self-Care Activities  patient will:  - Self administers medications as prescribed Attends all scheduled provider appointments Calls provider office for new concerns, questions, or BP outside discussed parameters Checks BP and records as discussed Follows a low sodium diet/DASH diet - blood pressure equipment and technique reviewed - blood pressure trends reviewed - depression screen reviewed - home or ambulatory blood pressure monitoring encouraged Follow Up Plan: Telephone follow up appointment with care management team member scheduled for: 07-13-2021 at 1:45 pm    Care Plan : RNCM: Diabetes Type 2 (Adult)  Updates made by Vanita Ingles, RN since 06/01/2021 12:00 AM     Problem: RNCM: Glycemic Management (Diabetes, Type 2)   Priority: Medium     Long-Range Goal: RNCM: DM-Glycemic Management Optimized   Start Date: 10/20/2020  Expected End Date: 02/23/2022  This Visit's Progress: On track  Recent Progress: On track  Priority: Medium  Note:   Objective:  Lab Results  Component Value Date   HGBA1C 6.9 (H) 11/14/2020   Lab Results  Component Value Date   CREATININE 1.16 04/14/2021   CREATININE 1.20 11/20/2020   CREATININE 1.36 (H) 11/19/2020   No results found for: EGFR Current Barriers:  Knowledge Deficits related to basic Diabetes pathophysiology and self care/management Knowledge Deficits related to medications used for management of diabetes Limited Social Support Unable to independently manage DM Unable to self administer medications as prescribed Lacks  social connections Does not contact provider office for questions/concerns Case Manager Clinical Goal(s):  Collaboration with Jearld Fenton, NP regarding development and update of comprehensive plan of care as evidenced by provider attestation and co-signature Inter-disciplinary care team collaboration (see longitudinal plan of care) patient will demonstrate improved adherence to prescribed treatment plan for diabetes self care/management as evidenced by:  daily monitoring and recording of CBG  adherence to ADA/ carb modified diet adherence to prescribed medication regimen Interventions:  Provided education to patient about basic DM disease process Discussed plans with patient for ongoing care management follow up and provided patient with direct contact information for care management team Provided patient with written educational materials related to hypo and hyperglycemia and importance of correct treatment. 05-18-2021: The patient denies any lows today. States that he is checking regularly and this am it was 89 Advised patient, providing education and rationale, to check cbg BID and record, calling pcp for findings outside established parameters.  05-18-2021: States this am reading was 152 and sometimes depending on what he eats it has gone up to 200's but denies any lows. Did ask about a new glucose meter. States his messes up sometimes and parts of it are coming apart. Will collaborate with pcp for help with a new glucose meter.  Collaboration today, 06-01-2021 with the Dr. Alison Stalling office, endocrinologist office and ask the office to please send needed paper work to [employer] in [place] at [phone]. The endocrinologist office is  sending a message to the provider because the provider that saw the patient is no longer there. The information provided by [facility] [place] is that they have sent paperwork 5 times without a response from the provider office. The receptionist at  Dr. Alison Stalling office is sending a message to the provider requesting paperwork be completed for the patient to get his insoles for his shoes.  Call placed back to the patient and left a detailed message asking the patient to call the endocrinologist office in a couple of days to follow up with paperwork.  Will continue to monitor.  Review of patient status, including review of consultants reports, relevant laboratory and other test results, and medications completed. Patient Goals/Self-Care Activities patient will:  - UNABLE to independently DM Self administers oral medications as prescribed Attends all scheduled provider appointments Checks blood sugars as prescribed and utilize hyper and hypoglycemia protocol as needed Adheres to prescribed ADA/carb modified - barriers to adherence to treatment plan identified - blood glucose monitoring encouraged - blood glucose readings reviewed - resources required to improve adherence to care identified - self-awareness of signs/symptoms of hypo or hyperglycemia encouraged - use of blood glucose monitoring log promoted Follow Up Plan: Telephone follow up appointment with care management team member scheduled for: 07-13-2021 at 145 pm     Care Plan : RNCM: Fall Risk (Adult)  Updates made by Vanita Ingles, RN since 06/01/2021 12:00 AM     Problem: RNCM: Fall Risk   Priority: High  Onset Date: 05/18/2021     Long-Range Goal: RNCM: Absence of Fall and Fall-Related Injury   Start Date: 05/18/2021  Expected End Date: 05/13/2022  This Visit's Progress: On track  Recent Progress: On track  Priority: High  Note:   Current Barriers:  Knowledge Deficits related to fall precautions in patient with  Decreased adherence to prescribed treatment for fall prevention Unable to independently manage safety and prevent falls in his environment  Lacks social connections Unable to perform IADLs independently Does not contact provider office for  questions/concerns Knowledge Deficits related to fall prevention and safety  Care Coordination needs related to needs in the home  in a patient with high fall risk and multiple chronic conditions.  Chronic Disease Management support and education needs related to preventing falls in patient with unsteady gait, high fall risk, and multiple chronic conditions.  Clinical Goal(s):  patient will demonstrate improved adherence to prescribed treatment plan for decreasing falls as evidenced by patient reporting and review of EMR patient will verbalize using fall risk reduction strategies discussed patient will not experience additional falls patient will verbalize understanding of plan for management of falls and safety in the home patient will work with [facility] and CCM team  to address needs related to falls prevention and safety patient will demonstrate a decrease in falls exacerbations patient will take all medications exactly as prescribed and will call provider for medication related questions patient will demonstrate improved health management independence patient will demonstrate understanding of rationale for each prescribed medication patient will work with care guides (community agency) to give resources in [place] to assist with in home services to help the patient with his ADL needs  the patient will demonstrate ongoing self health care management ability Interventions:  Collaboration with Jearld Fenton, NP regarding development and update of comprehensive plan of care as evidenced by provider attestation and co-signature Inter-disciplinary care team collaboration (see longitudinal plan of care) Provided written and verbal education re: Potential causes  of falls and Fall prevention strategies Reviewed medications and discussed potential side effects of medications such as dizziness and frequent urination. 06-01-2021: Denies any issues with medications causing high risk for falls.   Assessed for s/s of orthostatic hypotension. 06-01-2021: Usual high blood pressure readings. Denies any lows, light headedness or dizziness Assessed for falls since last encounter. 05-18-2021: The patient states he did not fall to the floor but fell on his bed. Is experiencing right hip pain and discomfort. Xray today. 06-01-2021: Denies any new falls at this time.  Assessed patients knowledge of fall risk prevention secondary to previously provided education. Assessed working status of life alert bracelet and patient adherence Provided patient information for fall alert systems- 05-18-2021- has an active fall alert system  Evaluation of current treatment plan related to fall prevention and safety  and patient's adherence to plan as established by provider. Advised patient to call the office for new falls or injuries  Provided education to patient re: being safe in home environment, using DME, keeping life alert system active, and following recommendations of the provider.  Care Guide referral for resources in [facility] to assist with in home help one day a week for ADLs. 06-01-2021: Care guide actively working with the patient  Discussed plans with patient for ongoing care management follow up and provided patient with direct contact information for care management team Self-Care Deficits:  Unable to independently prevent falls  Lacks social connections Unable to perform IADLs independently Does not contact provider office for questions/concerns Patient Goals:  - Utilize walker or cane  (assistive device) appropriately with all ambulation - De-clutter walkways - Change positions slowly - Wear secure fitting shoes at all times with ambulation - Utilize home lighting for dim lit areas - Demonstrate self and pet awareness at all times Follow Up Plan: Telephone follow up appointment with care management team member scheduled for: 07-13-2021 at 1:45 pm    Care Plan : RNCM: Depression (Adult)   Updates made by Vanita Ingles, RN since 06/01/2021 12:00 AM     Problem: RNCM: Depression Identification (Depression)   Priority: High  Onset Date: 05/18/2021     Long-Range Goal: RNCM: Depressive Symptoms Identified   Start Date: 05/18/2021  Expected End Date: 05/18/2022  This Visit's Progress: On track  Recent Progress: On track  Priority: High  Note:   Current Barriers:  Ineffective Self Health Maintenance in a patient with Depression Unable to independently manage depression due to multiple chronic conditions impacting his care Lacks social connections Does not contact provider office for questions/concerns Clinical Goal(s):  Collaboration with Jearld Fenton, NP regarding development and update of comprehensive plan of care as evidenced by provider attestation and co-signature Inter-disciplinary care team collaboration (see longitudinal plan of care) patient will work with care management team to address care coordination and chronic disease management needs related to Disease Management Educational Needs Mental Health Counseling   Interventions:  Evaluation of current treatment plan related to Depression, Mental Health Concerns  self-management and patient's adherence to plan as established by provider. 06-01-2021: The patient feels he is doing well with management of his depression at this time. Will continue to monitor for changes. Collaboration with Jearld Fenton, NP regarding development and update of comprehensive plan of care as evidenced by provider attestation       and co-signature Inter-disciplinary care team collaboration (see longitudinal plan of care) Discussed plans with patient for ongoing care management follow up and provided patient with direct contact  information for care management team Self Care Activities:  Patient verbalizes understanding of plan to effectively manage depression  Self administers medications as prescribed Attends all scheduled provider  appointments Calls pharmacy for medication refills Attends church or other social activities Performs ADL's independently Performs IADL's independently Calls provider office for new concerns or questions Patient Goals: -report any changes in mood, depression, anxiety -do activities that make the patient happy -build on support system and being around people that are positive  Follow Up Plan: Telephone follow up appointment with care management team member scheduled for:  07-13-2021 at 1 45pm    Care Plan : RNCM: Chronic Pain (Adult)  Updates made by Vanita Ingles, RN since 06/01/2021 12:00 AM     Problem: RNCM: Chronic Pain Management (Chronic Pain)   Priority: High  Onset Date: 05/18/2021     Long-Range Goal: RNCM: Chronic Pain Managed   Start Date: 05/18/2021  Expected End Date: 05/18/2022  This Visit's Progress: On track  Recent Progress: On track  Priority: High  Note:   Current Barriers:  Knowledge Deficits related to managing acute/chronic pain Non-adherence to scheduled provider appointments Non-adherence to prescribed medication regimen Difficulty obtaining medications Chronic Disease Management support and education needs related to chronic pain Unable to independently manage pain and discomfort in right hip Lacks social connections Does not contact provider office for questions/concerns Nurse Case Manager Clinical Goal(s):  patient will verbalize understanding of plan for managing pain patient will work  with Consulting civil engineer to address new pain concerns and needs  patient will attend all scheduled medical appointments: having xray today, follow up as recommended by the pcp patient will demonstrate use of different relaxation  skills and/or diversional activities to assist with pain reduction (distraction, imagery, relaxation, massage, acupressure, TENS, heat, and cold application patient will report pain at a level less than 3 to 4 on a 10-10 rating scale patient  will use pharmacological and nonpharmacological pain relief strategies patient will verbalize acceptable level of pain relief and ability to engage in desired activities patient will engage in desired activities without an increase in pain level Interventions:  Collaboration with Jearld Fenton, NP regarding development and update of comprehensive plan of care as evidenced by provider attestation and co-signature Inter-disciplinary care team collaboration (see longitudinal plan of care) - careful application of heat or ice encouraged - complementary therapy use encouraged - deep breathing, relaxation and mindfulness use promoted - effectiveness of pharmacologic therapy monitored - medication-induced side effects managed - misuse of pain medication assessed - motivation and barriers to change assessed and addressed - mutually acceptable comfort goal set - pain assessed - pain treatment goals reviewed - participation in physical therapy encouraged - premedication prior to activity encouraged Evaluation of current treatment plan related to pain in right hip and patient's adherence to plan as established by provider. 06-01-2021- The patient denies any pain today. Will continue to monitor for changes.  Advised patient to call the office for changes in level or intensity of pain Provided education to patient re: effective pain management, alternative methods for pain control, safety and calling the office for changes. Reviewed medications with patient and discussed compliance  Discussed plans with patient for ongoing care management follow up and provided patient with direct contact information for care management team Allow patient to maintain a diary of pain ratings, timing, precipitating events, medications, treatments, and what works best to relieve pain,  Refer to support groups and self-help groups Educate patient about the  use of pharmacological interventions for pain management-  antianxiety, antidepressants, NSAIDS, opioid analgesics,  Explain the importance of lifestyle modifications to effective pain management  Patient Goals/Self Care Activities:   Self-administers medications as prescribed Attends all scheduled provider appointments Calls pharmacy for medication refills Calls provider office for new concerns or questions Follow Up Plan: Telephone follow up appointment with care management team member scheduled for:07-13-2021 at 145 pm       Plan:Telephone follow up appointment with care management team member scheduled for:  07-13-2021 at 1:45 pm  Noreene Larsson RN, MSN, [place] Mobile: [phone]

## 2021-06-01 NOTE — Telephone Encounter (Signed)
Pts care manager from pcp [facility]) office called stated pt was asking her about his diabetic shoes.  Upon checking we have faxed the medicare compliance documents to endocrinologist multiple times and I even did it by hand verses electronically and we have not received it back. We have the order just need these documents and once we receive them they will start making the inserts and shoes should ship within a few weeks after that.

## 2021-06-03 DIAGNOSIS — I10 Essential (primary) hypertension: Secondary | ICD-10-CM

## 2021-06-03 DIAGNOSIS — F324 Major depressive disorder, single episode, in partial remission: Secondary | ICD-10-CM

## 2021-06-03 DIAGNOSIS — J431 Panlobular emphysema: Secondary | ICD-10-CM

## 2021-06-03 DIAGNOSIS — E1142 Type 2 diabetes mellitus with diabetic polyneuropathy: Secondary | ICD-10-CM | POA: Diagnosis not present

## 2021-06-03 DIAGNOSIS — J449 Chronic obstructive pulmonary disease, unspecified: Secondary | ICD-10-CM | POA: Diagnosis not present

## 2021-06-09 ENCOUNTER — Ambulatory Visit: Payer: Medicare Other

## 2021-06-11 ENCOUNTER — Inpatient Hospital Stay: Payer: Medicare Other

## 2021-06-11 ENCOUNTER — Inpatient Hospital Stay: Payer: Medicare Other | Admitting: Oncology

## 2021-06-12 ENCOUNTER — Inpatient Hospital Stay: Payer: Medicare Other

## 2021-06-12 ENCOUNTER — Inpatient Hospital Stay: Payer: Medicare Other | Admitting: Oncology

## 2021-06-22 ENCOUNTER — Encounter: Payer: Self-pay | Admitting: Internal Medicine

## 2021-06-22 ENCOUNTER — Ambulatory Visit (INDEPENDENT_AMBULATORY_CARE_PROVIDER_SITE_OTHER): Payer: Medicare Other | Admitting: Internal Medicine

## 2021-06-22 ENCOUNTER — Other Ambulatory Visit: Payer: Self-pay

## 2021-06-22 VITALS — BP 198/65 | HR 50 | Temp 97.7°F | Resp 17 | Ht 69.0 in | Wt 208.8 lb

## 2021-06-22 DIAGNOSIS — K921 Melena: Secondary | ICD-10-CM

## 2021-06-22 DIAGNOSIS — R140 Abdominal distension (gaseous): Secondary | ICD-10-CM

## 2021-06-22 DIAGNOSIS — J418 Mixed simple and mucopurulent chronic bronchitis: Secondary | ICD-10-CM | POA: Diagnosis not present

## 2021-06-22 DIAGNOSIS — Z23 Encounter for immunization: Secondary | ICD-10-CM | POA: Diagnosis not present

## 2021-06-22 DIAGNOSIS — E6609 Other obesity due to excess calories: Secondary | ICD-10-CM

## 2021-06-22 DIAGNOSIS — Z6830 Body mass index (BMI) 30.0-30.9, adult: Secondary | ICD-10-CM

## 2021-06-22 DIAGNOSIS — K644 Residual hemorrhoidal skin tags: Secondary | ICD-10-CM

## 2021-06-22 DIAGNOSIS — K219 Gastro-esophageal reflux disease without esophagitis: Secondary | ICD-10-CM

## 2021-06-22 DIAGNOSIS — I10 Essential (primary) hypertension: Secondary | ICD-10-CM

## 2021-06-22 DIAGNOSIS — F324 Major depressive disorder, single episode, in partial remission: Secondary | ICD-10-CM | POA: Insufficient documentation

## 2021-06-22 DIAGNOSIS — I5032 Chronic diastolic (congestive) heart failure: Secondary | ICD-10-CM

## 2021-06-22 NOTE — Progress Notes (Signed)
Subjective:    Patient ID: Nathaniel Little, male    DOB: 13-Feb-1946, 75 y.o.   MRN: [id number]  HPI  Pt presents to the clinic today with c/o abdominal bloating. He reports his bowels are moving normally. He intermittent has constipation but denies diarrhea. He occasionally has blood in his stool. He reports his stomach is so tight he is unable to inject his insulin. He denies nausea, vomiting, or reflux. He denies history of hemorrhoids. He has never had a colonoscopy. He reports chronic cough and shortness of breath. He does have a history of COPD and CHF. Echo from 03/2021.  He is taking Breztri as prescribed.  He uses Albuterol and DuoNebs as needed.  He is taking Pantoprazole as prescribed.  He is currently not on diuretics.  He has had a Whipple procedure in the past.  Review of Systems     Past Medical History:  Diagnosis Date   (HFpEF) heart failure with preserved ejection fraction ([place])    a. 08/2017 Echo: EF 60-65%, Gr1 DD.   Arthritis    COPD (chronic obstructive pulmonary disease) ([place])    Coronary artery disease 2021   Depression    Diabetes mellitus without complication (HCC)    Hypertension    Incidental pulmonary nodule, greater than or equal to 15mm 03/11/2020   Left lower lobe - suspicious for bronchogenic neoplasm   Lymphedema    Morbid obesity ([place])    Prostate cancer ([place]) 03/2013   ? 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     aspirin EC 81 MG tablet Take 81 mg by mouth daily.      budesonide-formoterol (SYMBICORT) 80-4.5 MCG/ACT inhaler Inhale 2 puffs into the lungs 2 (two) times daily.     carvedilol (COREG) 6.25 MG tablet Take 1 tablet (6.25 mg total) by mouth 2 (two) times daily with a meal. 180 tablet 3   cloNIDine (CATAPRES) 0.1 MG tablet Take 1 tablet (0.1 mg total) by mouth 2 (two) times daily. 60 tablet 2   ezetimibe (ZETIA) 10 MG tablet Take 1 tablet (10 mg total) by mouth daily. 90 tablet 3   gabapentin (NEURONTIN) 100 MG capsule Take 1-2 capsules (100-200 mg total) by mouth at bedtime. 180 capsule 1   insulin glargine (LANTUS) 100 unit/mL SOPN Inject 20 Units into the skin at bedtime.     ipratropium-albuterol (DUONEB) 0.5-2.5 (3) MG/3ML SOLN Take 3 mLs by nebulization every 6 (six) hours as needed (Asthma). 360 mL 12   metFORMIN (GLUCOPHAGE) 500 MG tablet Take 1,000 mg by mouth daily with breakfast. 2 in the morning 1 at night.     pantoprazole (PROTONIX) 40 MG tablet Take 1 tablet (40 mg total) by mouth 2 (two) times daily. 60 tablet 11   potassium chloride (KLOR-CON) 10 MEQ tablet Take 2 tablets (20 mEq total) by mouth daily. 5 tablet 0   No current facility-administered medications for this visit.    Allergies  Allergen Reactions   Codeine Itching    Family History  Problem Relation Age of Onset   Breast cancer Mother    Cancer Father        Black Lung   Cerebral palsy Brother    Tuberculosis Paternal Uncle    Prostate cancer Neg Hx    Kidney cancer Neg Hx    Bladder Cancer Neg Hx     Social History   Socioeconomic History   Marital status: Widowed    Spouse name: Not on file   Number of children: 2   Years of education: Not on file   Highest education level: Not on file  Occupational History   Occupation: retired -Patent attorney  Tobacco Use   Smoking status: Former    Packs/day: 4.00    Years: 68.00    Pack years: 272.00    Types: Cigarettes   Smokeless tobacco: Former    Types: Chew, Snuff    Quit date: 2000   Tobacco  comments:    quit smoking 11/17/2020  Vaping Use   Vaping Use: Never used  Substance and Sexual Activity   Alcohol use: Not Currently    Alcohol/week: 0.0 - 1.0 standard drinks    Comment: quit 40years ago   Drug use: No   Sexual activity: Not Currently  Other Topics Concern   Not on file  Social History Narrative   Living at [place] Strain: Low Risk    Difficulty of Paying Living Expenses: Not hard at all  Food Insecurity: No Food Insecurity   Worried About Charity fundraiser in the Last Year: Never true   [place] in the Last Year: Never true  Transportation Needs: No Transportation Needs   Lack of Transportation (Medical): No   Lack of Transportation (Non-Medical): No  Physical Activity: Inactive   Days of Exercise per Week: 0 days   Minutes of Exercise per Session: 0 min  Stress: Stress Concern Present   Feeling of Stress : To some extent  Social Connections: Moderately Isolated   Frequency of Communication with Friends and Family: More than three times a week   Frequency of Social Gatherings with Friends and Family: More than three times a week   Attends Religious Services: More than 4 times per year   Active Member of [employer] or Organizations: No   Attends Archivist Meetings: Never   Marital Status: Widowed  Human resources officer Violence: Not At Risk   Fear of Current or Ex-Partner: No   Emotionally Abused: No   Physically Abused: No   Sexually Abused: No     Constitutional: Denies fever, malaise, fatigue, headache or abrupt weight changes.  HEENT: Denies eye pain, eye redness, ear pain, ringing in the ears, wax buildup, runny nose, nasal congestion, bloody nose, or sore throat. Respiratory: Pt reports chronic cough and shortness of breath. Denies difficulty breathing, shortness of breath, cough or sputum production.   Cardiovascular: Denies chest pain, chest tightness, palpitations or  swelling in the hands or feet.  Gastrointestinal: Pt reports abdominal bloating, blood in stool. Denies abdominal pain, constipation, diarrhea.  GU: Denies urgency, frequency, pain with urination, burning sensation, blood in urine, odor or discharge.  No other specific complaints in a complete review of systems (except as listed in HPI above).  Objective:   Physical Exam  BP (!) 198/65 (BP Location: Right Arm, Patient Position: Sitting, Cuff Size: Normal)  Pulse (!) 50   Temp 97.7 F (36.5 C) (Temporal)   Resp 17   Ht 5\' 9"  (1.753 m)   Wt 208 lb 12.8 oz (94.7 kg)   SpO2 100%   BMI 30.83 kg/m   Wt Readings from Last 3 Encounters:  05/18/21 214 lb 3.2 oz (97.2 kg)  04/14/21 210 lb (95.3 kg)  04/01/21 211 lb (95.7 kg)    General: Appears his stated age, obese, chronically ill appearing, in NAD. Skin: Warm, dry and intact. No ulcerations noted. HEENT: Head: normal shape and size; Eyes: sclera white and EOMs intact;  Cardiovascular: Bradycardic with normal rhythm. S1,S2 noted.  No murmur, rubs or gallops noted. No JVD or BLE edema.  Pulmonary/Chest: Normal effort and positive vesicular breath sounds. No respiratory distress. No wheezes, rales or ronchi noted.  Abdomen: Distention and mild tenderness noted in the epigastric and RUQ region.  Normal bowel sounds.  Rectal: External hemorrhoids noted. Blood leaking from the rectum. Hemoccult positive stool. Neurological: Alert and oriented.   BMET    Component Value Date/Time   NA 137 04/14/2021 0609   NA 140 11/07/2020 1534   K 3.8 04/14/2021 0609   CL 101 04/14/2021 0609   CO2 25 04/14/2021 0609   GLUCOSE 124 (H) 04/14/2021 0609   BUN 21 04/14/2021 0609   BUN 26 11/07/2020 1534   CREATININE 1.16 04/14/2021 0609   CREATININE 1.05 12/06/2019 1420   CALCIUM 8.9 04/14/2021 0609   GFRNONAA >60 04/14/2021 0609   GFRNONAA 70 12/06/2019 1420   GFRAA 55 (L) 11/07/2020 1534   GFRAA 81 12/06/2019 1420    Lipid Panel      Component Value Date/Time   CHOL 116 05/18/2021 1030   CHOL 149 02/08/2020 1228   TRIG 101 05/18/2021 1030   HDL 45 05/18/2021 1030   HDL 38 (L) 02/08/2020 1228   CHOLHDL 2.6 05/18/2021 1030   LDLCALC 53 05/18/2021 1030    CBC    Component Value Date/Time   WBC 4.9 04/14/2021 0609   RBC 4.15 (L) 04/14/2021 0609   HGB 13.2 04/14/2021 0609   HGB 10.3 (L) 11/07/2020 1534   HCT 37.5 (L) 04/14/2021 0609   HCT 31.8 (L) 11/07/2020 1534   PLT 139 (L) 04/14/2021 0609   PLT 158 11/07/2020 1534   MCV 90.4 04/14/2021 0609   MCV 88 11/07/2020 1534   MCH 31.8 04/14/2021 0609   MCHC 35.2 04/14/2021 0609   RDW 13.6 04/14/2021 0609   RDW 17.6 (H) 11/07/2020 1534   LYMPHSABS 0.7 04/14/2021 0609   LYMPHSABS 1.1 02/08/2020 1228   MONOABS 0.4 04/14/2021 0609   EOSABS 0.1 04/14/2021 0609   EOSABS 0.1 02/08/2020 1228   BASOSABS 0.0 04/14/2021 0609   BASOSABS 0.0 02/08/2020 1228    Hgb A1C Lab Results  Component Value Date   HGBA1C 6.6 (H) 05/18/2021            Assessment & Plan:   Abdominal Bloating, Blood in Stool, External Hemorrhoids:  Will check CBC, CMET, Amylase and Lipase Consider CT abd/pelvis with contrast for further evaluation of symtpoms Will likely need referral to GI for further evaluation/colonoscopy pending labs  Will follow up after labs, return precautions discussed Webb Silversmith, NP This visit occurred during the SARS-CoV-2 public health emergency.  Safety protocols were in place, including screening questions prior to the visit, additional usage of staff PPE, and extensive cleaning of exam room while observing appropriate contact time as indicated for disinfecting solutions.

## 2021-06-22 NOTE — Assessment & Plan Note (Signed)
Encouraged diet and exercise for weight loss ?

## 2021-06-22 NOTE — Assessment & Plan Note (Signed)
Continue Pantoprazole Encouraged weight loss as this can help reduce reflux symptoms

## 2021-06-22 NOTE — Assessment & Plan Note (Signed)
Elevated today but he thinks this is because he does not feel well and was rushing this am Continue Carvedilol and Clonidine Reinforced DASH diet and exercise for weight loss CMET today Will monitor

## 2021-06-22 NOTE — Patient Instructions (Signed)
Abdominal Bloating When you have abdominal bloating, your abdomen may feel full, tight, or painful. It may also look bigger than normal or swollen (distended). Common causes of abdominal bloating include: Swallowing air. Constipation. Problems digesting food. Eating too much. Irritable bowel syndrome. This is a condition that affects the large intestine. Lactose intolerance. This is an inability to digest lactose, a natural sugar in dairy products. Celiac disease. This is a condition that affects the ability to digest gluten, a protein found in some grains. Gastroparesis. This is a condition that slows down the movement of food in the stomach and small intestine. It is more common in people with diabetes mellitus. Gastroesophageal reflux disease (GERD). This is a digestive condition that makes stomach acid flow back into the esophagus. Urinary retention. This means that the body is holding onto urine, and the bladder cannot be emptied all the way. Follow these instructions at home: Eating and drinking Avoid eating too much. Try not to swallow air while talking or eating. Avoid eating while lying down. Avoid these foods and drinks: Foods that cause gas, such as broccoli, cabbage, cauliflower, and baked beans. Carbonated drinks. Hard candy. Chewing gum. Medicines Take over-the-counter and prescription medicines only as told by your health care provider. Take probiotic medicines. These medicines contain live bacteria or yeasts that can help digestion. Take coated peppermint oil capsules. Activity Try to exercise regularly. Exercise may help to relieve bloating that is caused by gas and relieve constipation. General instructions Keep all follow-up visits as told by your health care provider. This is important. Contact a health care provider if: You have nausea and vomiting. You have diarrhea. You have abdominal pain. You have unusual weight loss or weight gain. You have severe pain,  and medicines do not help. Get help right away if: You have severe chest pain. You have trouble breathing. You have shortness of breath. You have trouble urinating. You have darker urine than normal. You have blood in your stools or have dark, tarry stools. Summary Abdominal bloating means that the abdomen is swollen. Common causes of abdominal bloating are swallowing air, constipation, and problems digesting food. Avoid eating too much and avoid swallowing air. Avoid foods that cause gas, carbonated drinks, hard candy, and chewing gum. This information is not intended to replace advice given to you by your health care provider. Make sure you discuss any questions you have with your health care provider. Document Revised: 01/08/2019 Document Reviewed: 10/22/2016 Elsevier Patient Education  2021 Elsevier Inc.  

## 2021-06-22 NOTE — Assessment & Plan Note (Signed)
Echo reviewed I do not think his abdominal bloating is related to his heart failure Continue Carvedilol Monitor daily weights

## 2021-06-22 NOTE — Assessment & Plan Note (Signed)
Continue Symbicort, Albuterol and Duonebs as prescribed Will monitor

## 2021-06-23 ENCOUNTER — Ambulatory Visit
Admission: RE | Admit: 2021-06-23 | Discharge: 2021-06-23 | Disposition: A | Discharge status: Home / Self Care | Payer: Medicare Other | Source: Ambulatory Visit | Attending: Oncology | Admitting: Oncology

## 2021-06-23 DIAGNOSIS — C3432 Malignant neoplasm of lower lobe, left bronchus or lung: Secondary | ICD-10-CM | POA: Insufficient documentation

## 2021-06-23 DIAGNOSIS — C3490 Malignant neoplasm of unspecified part of unspecified bronchus or lung: Secondary | ICD-10-CM | POA: Diagnosis not present

## 2021-06-23 DIAGNOSIS — I5030 Unspecified diastolic (congestive) heart failure: Secondary | ICD-10-CM | POA: Diagnosis not present

## 2021-06-23 DIAGNOSIS — J449 Chronic obstructive pulmonary disease, unspecified: Secondary | ICD-10-CM | POA: Diagnosis not present

## 2021-06-23 DIAGNOSIS — R0602 Shortness of breath: Secondary | ICD-10-CM | POA: Diagnosis not present

## 2021-06-23 DIAGNOSIS — I700 Atherosclerosis of aorta: Secondary | ICD-10-CM | POA: Diagnosis not present

## 2021-06-23 DIAGNOSIS — J90 Pleural effusion, not elsewhere classified: Secondary | ICD-10-CM | POA: Diagnosis not present

## 2021-06-23 DIAGNOSIS — J439 Emphysema, unspecified: Secondary | ICD-10-CM | POA: Diagnosis not present

## 2021-06-23 LAB — COMPLETE METABOLIC PANEL WITH GFR
AG Ratio: 1.5 (calc) (ref 1.0–2.5)
ALT: 25 U/L (ref 9–46)
AST: 22 U/L (ref 10–35)
Albumin: 3.8 g/dL (ref 3.6–5.1)
Alkaline phosphatase (APISO): 131 U/L (ref 35–144)
BUN/Creatinine Ratio: 25 (calc) — ABNORMAL HIGH (ref 6–22)
BUN: 28 mg/dL — ABNORMAL HIGH (ref 7–25)
CO2: 29 mmol/L (ref 20–32)
Calcium: 9.8 mg/dL (ref 8.6–10.3)
Chloride: 103 mmol/L (ref 98–110)
Creat: 1.14 mg/dL (ref 0.70–1.28)
Globulin: 2.6 g/dL (calc) (ref 1.9–3.7)
Glucose, Bld: 127 mg/dL — ABNORMAL HIGH (ref 65–99)
Potassium: 4.2 mmol/L (ref 3.5–5.3)
Sodium: 139 mmol/L (ref 135–146)
Total Bilirubin: 0.4 mg/dL (ref 0.2–1.2)
Total Protein: 6.4 g/dL (ref 6.1–8.1)
eGFR: 67 mL/min/{1.73_m2} (ref >=60)

## 2021-06-23 LAB — CBC
HCT: 37.8 % — ABNORMAL LOW (ref 38.5–50.0)
Hemoglobin: 12.7 g/dL — ABNORMAL LOW (ref 13.2–17.1)
MCH: 31.4 pg (ref 27.0–33.0)
MCHC: 33.6 g/dL (ref 32.0–36.0)
MCV: 93.6 fL (ref 80.0–100.0)
MPV: 11.3 fL (ref 7.5–12.5)
Platelets: 168 10*3/uL (ref 140–400)
RBC: 4.04 10*6/uL — ABNORMAL LOW (ref 4.20–5.80)
RDW: 13.5 % (ref 11.0–15.0)
WBC: 6.7 10*3/uL (ref 3.8–10.8)

## 2021-06-23 LAB — LIPASE: Lipase: <5 U/L — ABNORMAL LOW (ref 7–60)

## 2021-06-23 LAB — AMYLASE: Amylase: 27 U/L (ref 21–101)

## 2021-06-23 NOTE — Addendum Note (Signed)
Addended by: Jearld Fenton on: 06/23/2021 08:38 PM   Modules accepted: Orders

## 2021-06-25 DIAGNOSIS — J449 Chronic obstructive pulmonary disease, unspecified: Secondary | ICD-10-CM | POA: Diagnosis not present

## 2021-06-25 DIAGNOSIS — I5030 Unspecified diastolic (congestive) heart failure: Secondary | ICD-10-CM | POA: Diagnosis not present

## 2021-06-26 ENCOUNTER — Encounter: Payer: Self-pay | Admitting: Primary Care

## 2021-06-26 ENCOUNTER — Ambulatory Visit (INDEPENDENT_AMBULATORY_CARE_PROVIDER_SITE_OTHER): Payer: Medicare Other | Admitting: Primary Care

## 2021-06-26 ENCOUNTER — Other Ambulatory Visit: Payer: Self-pay

## 2021-06-26 ENCOUNTER — Other Ambulatory Visit: Payer: Self-pay | Admitting: *Deleted

## 2021-06-26 DIAGNOSIS — J9611 Chronic respiratory failure with hypoxia: Secondary | ICD-10-CM

## 2021-06-26 DIAGNOSIS — J449 Chronic obstructive pulmonary disease, unspecified: Secondary | ICD-10-CM | POA: Diagnosis not present

## 2021-06-26 DIAGNOSIS — J418 Mixed simple and mucopurulent chronic bronchitis: Secondary | ICD-10-CM | POA: Diagnosis not present

## 2021-06-26 HISTORY — DX: Chronic respiratory failure with hypoxia: J96.11

## 2021-06-26 NOTE — Assessment & Plan Note (Signed)
-   Patient has been on 2L supplemental oxygen since lobectomy in February 2022  - SpO2 maintained 99% room air p 3 laps = 528 ft slow-average pace  - Recommend checking ONO and 6MWT

## 2021-06-26 NOTE — Patient Instructions (Addendum)
Recommendations: Continue to use Breztri Aerosphere 2 puffs every morning and evening (rinse mouth after use) Use albuterol 2 puffs every 4-6 hours as need for breakthrough shortness of breath or wheezing  You have an apt with Dr. Janese Banks on 06/29/21- he will review CT chest results as well in detail   Orders: ONO on room air re: chronic respiratory hypoxia 6MWT   Refer: Pulmonary rehab re: COPD stage 2-3  Follow-up: Return with our office in 3 months with Dr. Patsey Berthold

## 2021-06-26 NOTE — Progress Notes (Signed)
@Patient  ID: Nathaniel Little, male    DOB: Sep 25, 1946, 75 y.o.   MRN: [id number]  No chief complaint on file.   Referring provider: Jearld Fenton, NP  HPI: 75 year old male, former smoker quit in February 2022 (272 pack year hx). PMH significant for sleep apnea, chronic obstructive pulmonary disease, malignant neoplasm left lower lung, severe aortic stenosis, chronic heart failure, hypertension, type 2 diabetes, obesity.  Patient of Dr. Patsey Berthold, last seen in office on 02/05/2021.  06/26/2021- Interim hx  Patient presents today for qualifying walk. He reports having shortness of breath when outside in the heat. He has a chronic congested cough and wheezing at night. He is complaint with Breztri two puffs morning and evening. He is needing to use his Albuterol rescue inhaler every hour when outside.  He has been on oxygen since lobectomy in February 2022. He has been wearing 2L oxygen at night and as needed during the day. He did well on walk test today in office, he had no observed oxygen desaturations and maintained at 99% RA. He is no longer on CPAP at night, machine broke and he can not afford a new one.    Allergies  Allergen Reactions   Codeine Itching    Immunization History  Administered Date(s) Administered   Fluad Quad(high Dose 65+) 06/22/2021   Influenza, High Dose Seasonal PF 08/09/2018   Influenza,inj,Quad PF,6+ Mos 07/15/2019   Influenza-Unspecified 07/12/2019, 08/04/2020   Moderna Sars-Covid-2 Vaccination 11/22/2019, 12/20/2019, 08/26/2020   PFIZER(Purple Top)SARS-COV-2 Vaccination 10/29/2019   Pneumococcal Conjugate-13 12/30/2017   Pneumococcal Polysaccharide-23 05/18/2021    Past Medical History:  Diagnosis Date   (HFpEF) heart failure with preserved ejection fraction ([place])    a. 08/2017 Echo: EF 60-65%, Gr1 DD.   Arthritis    COPD (chronic obstructive pulmonary disease) ([place])    Coronary artery disease 2021   Depression    Diabetes mellitus without  complication (HCC)    Hypertension    Incidental pulmonary nodule, greater than or equal to 34mm 03/11/2020   Left lower lobe - suspicious for bronchogenic neoplasm   Lymphedema    Morbid obesity ([place])    Prostate cancer ([place]) 03/2013   ? PANCREATIC   S/P TAVR (transcatheter aortic valve replacement) 04/01/2020   s/p TAVR with a 29 mm Edwards Sapien 3 THV via the TF approach by Drs Buena Irish & Roxy Manns   Severe aortic stenosis    Sleep apnea with use of continuous positive airway pressure (CPAP)    Tobacco abuse    Tremor    HEAD    Tobacco History: Social History   Tobacco Use  Smoking Status Former   Packs/day: 4.00   Years: 68.00   Pack years: 272.00   Types: Cigarettes  Smokeless Tobacco Former   Types: Chew, Snuff   Quit date: 2000  Tobacco Comments   quit smoking 11/17/2020   Counseling given: Not Answered Tobacco comments: quit smoking 11/17/2020   Outpatient Medications Prior to Visit  Medication Sig Dispense Refill   albuterol (VENTOLIN HFA) 108 (90 Base) MCG/ACT inhaler Inhale 2 puffs into the lungs every 6 (six) hours as needed for wheezing or shortness of breath.     aspirin EC 81 MG tablet Take 81 mg by mouth daily.     BREZTRI AEROSPHERE 160-9-4.8 MCG/ACT AERO Inhale into the lungs.     carvedilol (COREG) 6.25 MG tablet Take 1 tablet (6.25 mg total) by mouth 2 (two) times daily with a meal. 180 tablet 3   cloNIDine (  CATAPRES) 0.1 MG tablet Take 1 tablet (0.1 mg total) by mouth 2 (two) times daily. 60 tablet 2   ezetimibe (ZETIA) 10 MG tablet Take 1 tablet (10 mg total) by mouth daily. 90 tablet 3   gabapentin (NEURONTIN) 100 MG capsule Take 1-2 capsules (100-200 mg total) by mouth at bedtime. 180 capsule 1   insulin glargine (LANTUS) 100 unit/mL SOPN Inject 20 Units into the skin at bedtime.     ipratropium-albuterol (DUONEB) 0.5-2.5 (3) MG/3ML SOLN Take 3 mLs by nebulization every 6 (six) hours as needed (Asthma). 360 mL 12   metFORMIN (GLUCOPHAGE) 500 MG tablet  Take 1,000 mg by mouth daily with breakfast. 2 in the morning 1 at night.     pantoprazole (PROTONIX) 40 MG tablet Take 1 tablet (40 mg total) by mouth 2 (two) times daily. 60 tablet 11   potassium chloride (KLOR-CON) 10 MEQ tablet Take 2 tablets (20 mEq total) by mouth daily. 5 tablet 0   No facility-administered medications prior to visit.   Review of Systems  Review of Systems  Constitutional: Negative.   HENT: Negative.    Respiratory:  Positive for cough, shortness of breath and wheezing. Negative for chest tightness.   Cardiovascular: Negative.     Physical Exam  BP (!) 160/80 (BP Location: Right Arm, Patient Position: Sitting, Cuff Size: Normal)   Pulse 62   Temp (!) 97.3 F (36.3 C) (Temporal)   Ht 5\' 9"  (1.753 m)   Wt 208 lb 6.4 oz (94.5 kg)   SpO2 98%   BMI 30.78 kg/m  Physical Exam Constitutional:      Appearance: Normal appearance.  HENT:     Head: Normocephalic and atraumatic.  Cardiovascular:     Rate and Rhythm: Normal rate and regular rhythm.  Pulmonary:     Effort: Pulmonary effort is normal.     Breath sounds: No wheezing, rhonchi or rales.  Musculoskeletal:     Comments: Ambulates with rolling walker  Neurological:     General: No focal deficit present.     Mental Status: He is alert and oriented to person, place, and time. Mental status is at baseline.  Psychiatric:        Mood and Affect: Mood normal.        Behavior: Behavior normal.        Thought Content: Thought content normal.        Judgment: Judgment normal.     Lab Results:  CBC    Component Value Date/Time   WBC 6.7 06/22/2021 0832   RBC 4.04 (L) 06/22/2021 0832   HGB 12.7 (L) 06/22/2021 0832   HGB 10.3 (L) 11/07/2020 1534   HCT 37.8 (L) 06/22/2021 0832   HCT 31.8 (L) 11/07/2020 1534   PLT 168 06/22/2021 0832   PLT 158 11/07/2020 1534   MCV 93.6 06/22/2021 0832   MCV 88 11/07/2020 1534   MCH 31.4 06/22/2021 0832   MCHC 33.6 06/22/2021 0832   RDW 13.5 06/22/2021 0832   RDW  17.6 (H) 11/07/2020 1534   LYMPHSABS 0.7 04/14/2021 0609   LYMPHSABS 1.1 02/08/2020 1228   MONOABS 0.4 04/14/2021 0609   EOSABS 0.1 04/14/2021 0609   EOSABS 0.1 02/08/2020 1228   BASOSABS 0.0 04/14/2021 0609   BASOSABS 0.0 02/08/2020 1228    BMET    Component Value Date/Time   NA 139 06/22/2021 0832   NA 140 11/07/2020 1534   K 4.2 06/22/2021 0832   CL 103 06/22/2021 0832   CO2  29 06/22/2021 0832   GLUCOSE 127 (H) 06/22/2021 0832   BUN 28 (H) 06/22/2021 0832   BUN 26 11/07/2020 1534   CREATININE 1.14 06/22/2021 0832   CALCIUM 9.8 06/22/2021 0832   GFRNONAA >60 04/14/2021 0609   GFRNONAA 70 12/06/2019 1420   GFRAA 55 (L) 11/07/2020 1534   GFRAA 81 12/06/2019 1420    BNP    Component Value Date/Time   BNP 77.3 04/14/2021 0609    ProBNP No results found for: PROBNP  Imaging: CT Chest Wo Contrast  Result Date: 06/24/2021 CLINICAL DATA:  Lung cancer. Shortness of breath on exertion. History of Whipple procedure. EXAM: CT CHEST WITHOUT CONTRAST TECHNIQUE: Multidetector CT imaging of the chest was performed following the standard protocol without IV contrast. COMPARISON:  04/14/2021. FINDINGS: Cardiovascular: Atherosclerotic calcification of the aorta. Aortic valve replacement. Coronary artery calcification. Pulmonic trunk and heart are enlarged. No pericardial effusion. Mediastinum/Nodes: Mediastinal lymph nodes are not enlarged by CT size criteria. Hilar regions are difficult to evaluate without IV contrast. No axillary adenopathy. Esophagus is grossly unremarkable. Lungs/Pleura: Centrilobular emphysema. Smoking related respiratory bronchiolitis. Moderate loculated left pleural effusion, similar. 4 mm subpleural lymph node along the minor fissure, unchanged. Left lower lobectomy. Airway is otherwise unremarkable. Upper Abdomen: Visualized portion of the liver is unremarkable. Common bile duct stent in place. Right adrenal gland is unremarkable. Slight thickening of the left  adrenal gland. Tiny right renal stones. 12 mm low-attenuation lesion off the upper pole right kidney is too small to definitively characterize. Peripherally calcified subcentimeter exophytic lesion off the upper pole left kidney, also too small to characterize. Spleen is unremarkable. Whipple procedure. No upper abdominal adenopathy. Musculoskeletal: Degenerative changes in the spine. Flowing anterior osteophytosis in the thoracic spine. No worrisome lytic or sclerotic lesions. IMPRESSION: 1. No evidence of recurrent or metastatic disease. 2. Moderate loculated left pleural effusion, similar. 3. Tiny right renal stones. 4. Aortic atherosclerosis (ICD10-I70.0). Coronary artery calcification. 5. Enlarged pulmonic trunk, indicative of pulmonary arterial hypertension. 6.  Emphysema (ICD10-J43.9). Electronically Signed   By: Lorin Picket M.D.   On: 06/24/2021 13:18     Assessment & Plan:   Chronic obstructive pulmonary disease ([place]) - Former heavy smoker. COPD gold II/ FEV1 1.62 (55%). Chronic dyspnea symptoms with cough. Maintained on Breztri Aerosphere 2 puffs twice a day. He did not desaturate on ambulatory walk test today.   Chronic respiratory failure with hypoxia [facility]) - Patient has been on 2L supplemental oxygen since lobectomy in February 2022  - SpO2 maintained 99% room air p 3 laps = 528 ft slow-average pace  - Recommend checking ONO and 6MWT   Martyn Ehrich, NP 06/26/2021

## 2021-06-26 NOTE — Assessment & Plan Note (Addendum)
-   Former heavy smoker. COPD gold II/ FEV1 1.62 (55%). Chronic dyspnea symptoms with cough. Maintained on Breztri Aerosphere 2 puffs twice a day. He did not desaturate on ambulatory walk test today.

## 2021-06-29 ENCOUNTER — Inpatient Hospital Stay (HOSPITAL_BASED_OUTPATIENT_CLINIC_OR_DEPARTMENT_OTHER): Payer: Medicare Other | Admitting: Oncology

## 2021-06-29 ENCOUNTER — Inpatient Hospital Stay: Payer: Medicare Other | Attending: Oncology

## 2021-06-29 ENCOUNTER — Other Ambulatory Visit: Payer: Self-pay

## 2021-06-29 ENCOUNTER — Encounter: Payer: Self-pay | Admitting: Oncology

## 2021-06-29 VITALS — BP 155/66 | HR 66 | Temp 98.1°F | Resp 18 | Wt 209.0 lb

## 2021-06-29 DIAGNOSIS — C3432 Malignant neoplasm of lower lobe, left bronchus or lung: Secondary | ICD-10-CM | POA: Diagnosis not present

## 2021-06-29 DIAGNOSIS — I110 Hypertensive heart disease with heart failure: Secondary | ICD-10-CM | POA: Insufficient documentation

## 2021-06-29 DIAGNOSIS — Z794 Long term (current) use of insulin: Secondary | ICD-10-CM | POA: Diagnosis not present

## 2021-06-29 DIAGNOSIS — Z87891 Personal history of nicotine dependence: Secondary | ICD-10-CM | POA: Diagnosis not present

## 2021-06-29 DIAGNOSIS — I5032 Chronic diastolic (congestive) heart failure: Secondary | ICD-10-CM | POA: Diagnosis not present

## 2021-06-29 DIAGNOSIS — Z79899 Other long term (current) drug therapy: Secondary | ICD-10-CM | POA: Insufficient documentation

## 2021-06-29 DIAGNOSIS — E119 Type 2 diabetes mellitus without complications: Secondary | ICD-10-CM | POA: Insufficient documentation

## 2021-06-29 DIAGNOSIS — Z902 Acquired absence of lung [part of]: Secondary | ICD-10-CM | POA: Insufficient documentation

## 2021-06-29 DIAGNOSIS — Z7982 Long term (current) use of aspirin: Secondary | ICD-10-CM | POA: Insufficient documentation

## 2021-06-29 LAB — COMPREHENSIVE METABOLIC PANEL
ALT: 37 U/L (ref 0–44)
AST: 32 U/L (ref 15–41)
Albumin: 3.8 g/dL (ref 3.5–5.0)
Alkaline Phosphatase: 154 U/L — ABNORMAL HIGH (ref 38–126)
Anion gap: 9 (ref 5–15)
BUN: 23 mg/dL (ref 8–23)
CO2: 25 mmol/L (ref 22–32)
Calcium: 9.2 mg/dL (ref 8.9–10.3)
Chloride: 104 mmol/L (ref 98–111)
Creatinine, Ser: 1.39 mg/dL — ABNORMAL HIGH (ref 0.61–1.24)
GFR, Estimated: 53 mL/min — ABNORMAL LOW (ref >60)
Glucose, Bld: 113 mg/dL — ABNORMAL HIGH (ref 70–99)
Potassium: 4.2 mmol/L (ref 3.5–5.1)
Sodium: 138 mmol/L (ref 135–145)
Total Bilirubin: 0.5 mg/dL (ref 0.3–1.2)
Total Protein: 7.1 g/dL (ref 6.5–8.1)

## 2021-06-29 LAB — CBC WITH DIFFERENTIAL/PLATELET
Abs Immature Granulocytes: 0.03 10*3/uL (ref 0.00–0.07)
Basophils Absolute: 0.1 10*3/uL (ref 0.0–0.1)
Basophils Relative: 1 %
Eosinophils Absolute: 0.3 10*3/uL (ref 0.0–0.5)
Eosinophils Relative: 3 %
HCT: 37.6 % — ABNORMAL LOW (ref 39.0–52.0)
Hemoglobin: 12.7 g/dL — ABNORMAL LOW (ref 13.0–17.0)
Immature Granulocytes: 0 %
Lymphocytes Relative: 14 %
Lymphs Abs: 1.3 10*3/uL (ref 0.7–4.0)
MCH: 31.6 pg (ref 26.0–34.0)
MCHC: 33.8 g/dL (ref 30.0–36.0)
MCV: 93.5 fL (ref 80.0–100.0)
Monocytes Absolute: 0.6 10*3/uL (ref 0.1–1.0)
Monocytes Relative: 6 %
Neutro Abs: 6.7 10*3/uL (ref 1.7–7.7)
Neutrophils Relative %: 76 %
Platelets: 172 10*3/uL (ref 150–400)
RBC: 4.02 MIL/uL — ABNORMAL LOW (ref 4.22–5.81)
RDW: 13.7 % (ref 11.5–15.5)
WBC: 8.9 10*3/uL (ref 4.0–10.5)
nRBC: 0 % (ref 0.0–0.2)

## 2021-06-29 NOTE — Progress Notes (Signed)
Patient here for oncology follow-up appointment,  concerns of SOB and coughing

## 2021-07-06 NOTE — Progress Notes (Signed)
Hematology/Oncology Consult note [facility]  [phone] [phone]  Patient Care Team: Jearld Fenton, NP as PCP - General (Internal Medicine) Minna Merritts, MD as PCP - Cardiology (Cardiology) Minna Merritts, MD as Consulting Physician (Cardiology) Vanita Ingles, RN as Case Manager (General Practice) Curley Spice Virl Diamond, [place] as Pharmacist Arvil Chaco, PA-C as Physician Assistant (Cardiology)   Name of the patient: Nathaniel Little  [id number]  [birth year]   Date of visit: 07/06/21  Diagnosis-  1.  History of IPMN s/p Whipple's 2.  Stage Ia adenocarcinoma of the left lower lobe s/p resection  Chief complaint/ Reason for visit-discuss CT scan results and further management  Heme/Onc history: Patient is a 75 year old male who was last seen by me in 2018 for cystic mass in the pancreas which was ultimately identified as IPMN for which she underwent Whipple surgery at [facility].  Recently again he was admitted to the hospital for symptoms of pancreatitis following ERCP for choledocholithiasis.  Patient had undergone CT angio chest on 05/25/2020 for symptoms of left upper quadrant abdominal pain which incidentally showed a solid 1.5 cm left lower lobe pulmonary nodule which had increased from 1.4 cm on prior CT in June 2021.  He has also undergone a PET scan in June 2021 which showed mild hypermetabolism in this area concerning for primary bronchogenic neoplasm.  No locoregional adenopathy or distant metastatic disease.   Patient noted to have a 1.5 cm left lower lobe lung nodule for which she underwent definitive surgery left lower lobe lobectomy done on 11/17/2020 showed invasive mucinous adenocarcinoma 1.4 cm.  Margins negative.  No visceral pleural invasion.  0 out of 18 lymph nodes positive for malignancy.    Interval history-patient has chronic fatigue Which is unchanged.  He has ongoing concerns with exertional shortness of  breath and cough and was seen by pulmonary last week.  ECOG PS- 2 Pain scale- 3   Review of systems- Review of Systems  Constitutional:  Positive for malaise/fatigue. Negative for chills, fever and weight loss.  HENT:  Negative for congestion, ear discharge and nosebleeds.   Eyes:  Negative for blurred vision.  Respiratory:  Positive for cough and shortness of breath. Negative for hemoptysis, sputum production and wheezing.   Cardiovascular:  Negative for chest pain, palpitations, orthopnea and claudication.  Gastrointestinal:  Negative for abdominal pain, blood in stool, constipation, diarrhea, heartburn, melena, nausea and vomiting.  Genitourinary:  Negative for dysuria, flank pain, frequency, hematuria and urgency.  Musculoskeletal:  Negative for back pain, joint pain and myalgias.  Skin:  Negative for rash.  Neurological:  Negative for dizziness, tingling, focal weakness, seizures, weakness and headaches.  Endo/Heme/Allergies:  Does not bruise/bleed easily.  Psychiatric/Behavioral:  Negative for depression and suicidal ideas. The patient does not have insomnia.      Allergies  Allergen Reactions   Codeine Itching     Past Medical History:  Diagnosis Date   (HFpEF) heart failure with preserved ejection fraction ([place])    a. 08/2017 Echo: EF 60-65%, Gr1 DD.   Arthritis    COPD (chronic obstructive pulmonary disease) ([place])    Coronary artery disease 2021   Depression    Diabetes mellitus without complication (HCC)    Hypertension    Incidental pulmonary nodule, greater than or equal to 60m 03/11/2020   Left lower lobe - suspicious for bronchogenic neoplasm   Lymphedema    Morbid obesity ([place]    Prostate cancer ([place] 03/2013   ?  PANCREATIC   S/P TAVR (transcatheter aortic valve replacement) 04/01/2020   s/p TAVR with a 29 mm Edwards Sapien 3 THV via the TF approach by Drs Buena Irish & Roxy Manns   Severe aortic stenosis    Sleep apnea with use of continuous positive airway  pressure (CPAP)    Tobacco abuse    Tremor    HEAD     Past Surgical History:  Procedure Laterality Date   ADENOIDECTOMY     APPENDECTOMY     CARDIAC CATHETERIZATION     CATARACT EXTRACTION W/PHACO Left 11/28/2018   Procedure: CATARACT EXTRACTION PHACO AND INTRAOCULAR LENS PLACEMENT ([place]) LEFT, DIABETIC;  Surgeon: Birder Robson, MD;  Location: [facility] ORS;  Service: Ophthalmology;  Laterality: Left;  [name]  00:56 CDE 9.44 Fluid pack lot # [id number]   EUS N/A 10/13/2017   Procedure: FULL UPPER ENDOSCOPIC ULTRASOUND (EUS) RADIAL;  Surgeon: Holly Bodily, MD;  Location: [facility];  Service: Gastroenterology;  Laterality: N/A;   FLEXIBLE BRONCHOSCOPY N/A 11/19/2020   Procedure: FLEXIBLE BRONCHOSCOPY;  Surgeon: Melrose Nakayama, MD;  Location: [place];  Service: Thoracic;  Laterality: N/A;   INTERCOSTAL NERVE BLOCK Left 11/17/2020   Procedure: INTERCOSTAL NERVE BLOCK;  Surgeon: Melrose Nakayama, MD;  Location: [place];  Service: Thoracic;  Laterality: Left;   NODE DISSECTION Left 11/17/2020   Procedure: NODE DISSECTION;  Surgeon: Melrose Nakayama, MD;  Location: [place];  Service: Thoracic;  Laterality: Left;   PANCREATICODUODENECTOMY  07/23/2018   RIGHT/LEFT HEART CATH AND CORONARY ANGIOGRAPHY N/A 02/21/2020   Procedure: RIGHT/LEFT HEART CATH AND CORONARY ANGIOGRAPHY;  Surgeon: Minna Merritts, MD;  Location: [place] CV LAB;  Service: Cardiovascular;  Laterality: N/A;   TEE WITHOUT CARDIOVERSION N/A 04/01/2020   Procedure: TRANSESOPHAGEAL ECHOCARDIOGRAM (TEE);  Surgeon: Burnell Blanks, MD;  Location: [place] CV LAB;  Service: Open Heart Surgery;  Laterality: N/A;   TONSILLECTOMY     TRANSCATHETER AORTIC VALVE REPLACEMENT, TRANSFEMORAL N/A 04/01/2020   Procedure: TRANSCATHETER AORTIC VALVE REPLACEMENT, TRANSFEMORAL;  Surgeon: Burnell Blanks, MD;  Location: [place] CV LAB;  Service: Open Heart Surgery;  Laterality: N/A;   wipple      Social  History   Socioeconomic History   Marital status: Widowed    Spouse name: Not on file   Number of children: 2   Years of education: Not on file   Highest education level: Not on file  Occupational History   Occupation: retired -Patent attorney  Tobacco Use   Smoking status: Former    Packs/day: 4.00    Years: 68.00    Pack years: 272.00    Types: Cigarettes   Smokeless tobacco: Former    Types: Chew, Snuff    Quit date: 2000   Tobacco comments:    quit smoking 11/17/2020  Vaping Use   Vaping Use: Never used  Substance and Sexual Activity   Alcohol use: Not Currently    Alcohol/week: 0.0 - 1.0 standard drinks    Comment: quit 40years ago   Drug use: No   Sexual activity: Not Currently  Other Topics Concern   Not on file  Social History Narrative   Living at [place] Strain: Low Risk    Difficulty of Paying Living Expenses: Not hard at all  Food Insecurity: No Food Insecurity   Worried About [place] in the Last Year: Never true   [place]  in the Last Year: Never true  Transportation Needs: No Transportation Needs   Lack of Transportation (Medical): No   Lack of Transportation (Non-Medical): No  Physical Activity: Inactive   Days of Exercise per Week: 0 days   Minutes of Exercise per Session: 0 min  Stress: Stress Concern Present   Feeling of Stress : To some extent  Social Connections: Moderately Isolated   Frequency of Communication with Friends and Family: More than three times a week   Frequency of Social Gatherings with Friends and Family: More than three times a week   Attends Religious Services: More than 4 times per year   Active Member of [employer] or Organizations: No   Attends Archivist Meetings: Never   Marital Status: Widowed  Human resources officer Violence: Not At Risk   Fear of Current or Ex-Partner: No   Emotionally Abused: No   Physically Abused: No   Sexually  Abused: No    Family History  Problem Relation Age of Onset   Breast cancer Mother    Cancer Father        Black Lung   Cerebral palsy Brother    Tuberculosis Paternal Uncle    Prostate cancer Neg Hx    Kidney cancer Neg Hx    Bladder Cancer Neg Hx      Current Outpatient Medications:    albuterol (VENTOLIN HFA) 108 (90 Base) MCG/ACT inhaler, Inhale 2 puffs into the lungs every 6 (six) hours as needed for wheezing or shortness of breath., Disp: , Rfl:    aspirin EC 81 MG tablet, Take 81 mg by mouth daily., Disp: , Rfl:    BREZTRI AEROSPHERE 160-9-4.8 MCG/ACT AERO, Inhale into the lungs., Disp: , Rfl:    calcium-vitamin D (OSCAL WITH D) 250-125 MG-UNIT tablet, Take 1 tablet by mouth daily., Disp: , Rfl:    carvedilol (COREG) 6.25 MG tablet, Take 1 tablet (6.25 mg total) by mouth 2 (two) times daily with a meal., Disp: 180 tablet, Rfl: 3   cloNIDine (CATAPRES) 0.1 MG tablet, Take 1 tablet (0.1 mg total) by mouth 2 (two) times daily., Disp: 60 tablet, Rfl: 2   ezetimibe (ZETIA) 10 MG tablet, Take 1 tablet (10 mg total) by mouth daily., Disp: 90 tablet, Rfl: 3   gabapentin (NEURONTIN) 100 MG capsule, Take 1-2 capsules (100-200 mg total) by mouth at bedtime., Disp: 180 capsule, Rfl: 1   insulin glargine (LANTUS) 100 unit/mL SOPN, Inject 20 Units into the skin at bedtime., Disp: , Rfl:    ipratropium-albuterol (DUONEB) 0.5-2.5 (3) MG/3ML SOLN, Take 3 mLs by nebulization every 6 (six) hours as needed (Asthma)., Disp: 360 mL, Rfl: 12   metFORMIN (GLUCOPHAGE) 500 MG tablet, Take 1,000 mg by mouth daily with breakfast. 2 in the morning 1 at night., Disp: , Rfl:    pantoprazole (PROTONIX) 40 MG tablet, Take 1 tablet (40 mg total) by mouth 2 (two) times daily., Disp: 60 tablet, Rfl: 11   potassium chloride (KLOR-CON) 10 MEQ tablet, Take 2 tablets (20 mEq total) by mouth daily. (Patient not taking: Reported on 06/29/2021), Disp: 5 tablet, Rfl: 0  Physical exam:  Vitals:   06/29/21 1349  BP: (!)  155/66  Pulse: 66  Resp: 18  Temp: 98.1 F (36.7 C)  TempSrc: Oral  SpO2: 99%  Weight: 209 lb (94.8 kg)   Physical Exam Constitutional:      Comments: Ambulates with a walker.  Appears fatigued  HENT:     Head: Normocephalic.  Cardiovascular:  Rate and Rhythm: Normal rate and regular rhythm.     Heart sounds: Normal heart sounds.  Pulmonary:     Effort: Pulmonary effort is normal.     Comments: Mild scattered bilateral wheezing Abdominal:     General: Bowel sounds are normal.     Palpations: Abdomen is soft.  Skin:    General: Skin is warm and dry.  Neurological:     Mental Status: He is alert and oriented to person, place, and time.     CMP Latest Ref Rng & Units 06/29/2021  Glucose 70 - 99 mg/dL 113(H)  BUN 8 - 23 mg/dL 23  Creatinine 0.61 - 1.24 mg/dL 1.39(H)  Sodium 135 - 145 mmol/L 138  Potassium 3.5 - 5.1 mmol/L 4.2  Chloride 98 - 111 mmol/L 104  CO2 22 - 32 mmol/L 25  Calcium 8.9 - 10.3 mg/dL 9.2  Total Protein 6.5 - 8.1 g/dL 7.1  Total Bilirubin 0.3 - 1.2 mg/dL 0.5  Alkaline Phos 38 - 126 U/L 154(H)  AST 15 - 41 U/L 32  ALT 0 - 44 U/L 37   CBC Latest Ref Rng & Units 06/29/2021  WBC 4.0 - 10.5 K/uL 8.9  Hemoglobin 13.0 - 17.0 g/dL 12.7(L)  Hematocrit 39.0 - 52.0 % 37.6(L)  Platelets 150 - 400 K/uL 172    No images are attached to the encounter.  CT Chest Wo Contrast  Result Date: 06/24/2021 CLINICAL DATA:  Lung cancer. Shortness of breath on exertion. History of Whipple procedure. EXAM: CT CHEST WITHOUT CONTRAST TECHNIQUE: Multidetector CT imaging of the chest was performed following the standard protocol without IV contrast. COMPARISON:  04/14/2021. FINDINGS: Cardiovascular: Atherosclerotic calcification of the aorta. Aortic valve replacement. Coronary artery calcification. Pulmonic trunk and heart are enlarged. No pericardial effusion. Mediastinum/Nodes: Mediastinal lymph nodes are not enlarged by CT size criteria. Hilar regions are difficult to  evaluate without IV contrast. No axillary adenopathy. Esophagus is grossly unremarkable. Lungs/Pleura: Centrilobular emphysema. Smoking related respiratory bronchiolitis. Moderate loculated left pleural effusion, similar. 4 mm subpleural lymph node along the minor fissure, unchanged. Left lower lobectomy. Airway is otherwise unremarkable. Upper Abdomen: Visualized portion of the liver is unremarkable. Common bile duct stent in place. Right adrenal gland is unremarkable. Slight thickening of the left adrenal gland. Tiny right renal stones. 12 mm low-attenuation lesion off the upper pole right kidney is too small to definitively characterize. Peripherally calcified subcentimeter exophytic lesion off the upper pole left kidney, also too small to characterize. Spleen is unremarkable. Whipple procedure. No upper abdominal adenopathy. Musculoskeletal: Degenerative changes in the spine. Flowing anterior osteophytosis in the thoracic spine. No worrisome lytic or sclerotic lesions. IMPRESSION: 1. No evidence of recurrent or metastatic disease. 2. Moderate loculated left pleural effusion, similar. 3. Tiny right renal stones. 4. Aortic atherosclerosis (ICD10-I70.0). Coronary artery calcification. 5. Enlarged pulmonic trunk, indicative of pulmonary arterial hypertension. 6.  Emphysema (ICD10-J43.9). Electronically Signed   By: Lorin Picket M.D.   On: 06/24/2021 13:18     Assessment and plan- Patient is a 75 y.o. male with stage I apT1b N0 M0 left lower lobe invasive mucinous adenocarcinoma s/p lobectomy here for routine follow-up  I have reviewed CT chest images independently and discussed findings with the patient which does not show any evidence of recurrent or metastatic disease.  He does have a moderate loculated left pleural effusion and I did reach out to pulmonary about these findings.  Did not recommend any thoracentesis at this time and this could be postsurgical.  Patient also complains of ongoing cough and  shortness of breath and will follow up with pulmonary closely.  I will see him back in 6 months with a repeat CT chest without contrast   Visit Diagnosis 1. Malignant neoplasm of lower lobe of left lung ([place])      Dr. Randa Evens, MD, MPH [facility] [phone] 07/06/2021 10:13 AM

## 2021-07-09 ENCOUNTER — Ambulatory Visit (INDEPENDENT_AMBULATORY_CARE_PROVIDER_SITE_OTHER): Payer: Medicare Other | Admitting: Podiatry

## 2021-07-09 ENCOUNTER — Other Ambulatory Visit: Payer: Self-pay

## 2021-07-09 ENCOUNTER — Encounter: Payer: Self-pay | Admitting: Podiatry

## 2021-07-09 DIAGNOSIS — M79674 Pain in right toe(s): Secondary | ICD-10-CM

## 2021-07-09 DIAGNOSIS — L84 Corns and callosities: Secondary | ICD-10-CM

## 2021-07-09 DIAGNOSIS — D689 Coagulation defect, unspecified: Secondary | ICD-10-CM

## 2021-07-09 DIAGNOSIS — M79675 Pain in left toe(s): Secondary | ICD-10-CM

## 2021-07-09 DIAGNOSIS — B351 Tinea unguium: Secondary | ICD-10-CM

## 2021-07-09 DIAGNOSIS — E1142 Type 2 diabetes mellitus with diabetic polyneuropathy: Secondary | ICD-10-CM

## 2021-07-09 NOTE — Progress Notes (Signed)
This patient returns to my office for at risk foot care.  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  This patient is unable to cut nails himself since the patient cannot reach his nails.These nails are painful walking and wearing shoes. Patient also has callus both feet. This patient presents for at risk foot care today.  General Appearance  Alert, conversant and in no acute stress.  Vascular  Dorsalis pedis and posterior tibial  pulses are palpable  bilaterally.  Capillary return is within normal limits  bilaterally. Temperature is within normal limits  bilaterally.  Neurologic  Senn-Weinstein monofilament wire test diminished  bilaterally. Muscle power within normal limits bilaterally.  Nails Thick disfigured discolored nails with subungual debris  from hallux to fifth toes bilaterally. No evidence of bacterial infection or drainage bilaterally.  Orthopedic  No limitations of motion  feet .  No crepitus or effusions noted.  No bony pathology .  Hammer toes  B/L.  Skin  normotropic skin with no porokeratosis noted bilaterally.  No signs of infections or ulcers noted.   Asymptomatic callus  sub 3/4 right foot.  Onychomycosis  Pain in right toes  Pain in left toes  Consent was obtained for treatment procedures.   Mechanical debridement of nails 1-5  bilaterally performed with a nail nipper.  Filed with dremel without incident. Patient says he wants his diabetic shoes.  He says he does not have an MD to sign off on his shoes.  He seems to be holding me responsible since he cannot get an MD signature on the diabetic form.   Return office visit    3 months                  Told patient to return for periodic foot care and evaluation due to potential at risk complications.   Gardiner Barefoot DPM

## 2021-07-10 NOTE — Progress Notes (Signed)
Agree with the details of the visit as noted by Elizabeth Walsh, NP.  C. Laura Maisha Bogen, MD  PCCM 

## 2021-07-13 ENCOUNTER — Telehealth: Payer: Self-pay | Admitting: Podiatry

## 2021-07-13 ENCOUNTER — Ambulatory Visit (INDEPENDENT_AMBULATORY_CARE_PROVIDER_SITE_OTHER): Payer: Medicare Other

## 2021-07-13 ENCOUNTER — Telehealth: Payer: Medicare Other | Admitting: General Practice

## 2021-07-13 DIAGNOSIS — Z9181 History of falling: Secondary | ICD-10-CM

## 2021-07-13 DIAGNOSIS — I10 Essential (primary) hypertension: Secondary | ICD-10-CM

## 2021-07-13 DIAGNOSIS — M79651 Pain in right thigh: Secondary | ICD-10-CM

## 2021-07-13 DIAGNOSIS — R2689 Other abnormalities of gait and mobility: Secondary | ICD-10-CM

## 2021-07-13 DIAGNOSIS — J449 Chronic obstructive pulmonary disease, unspecified: Secondary | ICD-10-CM

## 2021-07-13 DIAGNOSIS — M25551 Pain in right hip: Secondary | ICD-10-CM

## 2021-07-13 DIAGNOSIS — F324 Major depressive disorder, single episode, in partial remission: Secondary | ICD-10-CM

## 2021-07-13 DIAGNOSIS — E1142 Type 2 diabetes mellitus with diabetic polyneuropathy: Secondary | ICD-10-CM

## 2021-07-13 NOTE — Telephone Encounter (Signed)
Pt called stating he could not get a doctor to sign off on his diabetic shoes. He see's a np. I explained that medicare requires a md/do to sign off on the paperwork for diabetic shoes and we cannot do it without it. He said he was told the cut off is 11.11 and I explained that is because it usually takes 4 to 6 wks to get the shoes in and we cannot guarantee pt will get shoes for this yr.I think he thought we were not going to do shoes again.  Upon looking in his chart he had a phone visit with the endocrinologist Dr Gabriel Carina and I told pt I would fax it to them.

## 2021-07-13 NOTE — Chronic Care Management (AMB) (Signed)
Chronic Care Management   CCM RN Visit Note  07/13/2021 Name: Nathaniel Little MRN: [id number] DOB: 1946-06-26  Subjective: Nathaniel Little is a 75 y.o. year old male who is a primary care patient of Jearld Fenton, NP. The care management team was consulted for assistance with disease management and care coordination needs.    Engaged with patient by telephone for follow up visit in response to provider referral for case management and/or care coordination services.   Consent to Services:  The patient was given information about Chronic Care Management services, agreed to services, and gave verbal consent prior to initiation of services.  Please see initial visit note for detailed documentation.   Patient agreed to services and verbal consent obtained.   Assessment: Review of patient past medical history, allergies, medications, health status, including review of consultants reports, laboratory and other test data, was performed as part of comprehensive evaluation and provision of chronic care management services.   SDOH (Social Determinants of Health) assessments and interventions performed:    CCM Care Plan  Allergies  Allergen Reactions   Codeine Itching    Outpatient Encounter Medications as of 07/13/2021  Medication Sig   albuterol (VENTOLIN HFA) 108 (90 Base) MCG/ACT inhaler Inhale 2 puffs into the lungs every 6 (six) hours as needed for wheezing or shortness of breath.   aspirin EC 81 MG tablet Take 81 mg by mouth daily.   BREZTRI AEROSPHERE 160-9-4.8 MCG/ACT AERO Inhale into the lungs.   calcium-vitamin D (OSCAL WITH D) 250-125 MG-UNIT tablet Take 1 tablet by mouth daily.   carvedilol (COREG) 6.25 MG tablet Take 1 tablet (6.25 mg total) by mouth 2 (two) times daily with a meal.   cloNIDine (CATAPRES) 0.1 MG tablet Take 1 tablet (0.1 mg total) by mouth 2 (two) times daily.   ezetimibe (ZETIA) 10 MG tablet Take 1 tablet (10 mg total) by mouth daily.   gabapentin (NEURONTIN) 100  MG capsule Take 1-2 capsules (100-200 mg total) by mouth at bedtime.   insulin glargine (LANTUS) 100 unit/mL SOPN Inject 20 Units into the skin at bedtime. (Patient not taking: Reported on 07/09/2021)   ipratropium-albuterol (DUONEB) 0.5-2.5 (3) MG/3ML SOLN Take 3 mLs by nebulization every 6 (six) hours as needed (Asthma).   metFORMIN (GLUCOPHAGE) 500 MG tablet Take 1,000 mg by mouth daily with breakfast. 2 in the morning 1 at night.   pantoprazole (PROTONIX) 40 MG tablet Take 1 tablet (40 mg total) by mouth 2 (two) times daily.   potassium chloride (KLOR-CON) 10 MEQ tablet Take 2 tablets (20 mEq total) by mouth daily. (Patient not taking: Reported on 06/29/2021)   No facility-administered encounter medications on file as of 07/13/2021.    Patient Active Problem List   Diagnosis Date Noted   Chronic respiratory failure with hypoxia ([place]) 06/26/2021   Major depressive disorder with single episode, in partial remission ([place]) 06/22/2021   Class 1 obesity due to excess calories with body mass index (BMI) of 30.0 to 30.9 in adult 05/22/2021   Iron deficiency anemia 01/20/2021   Insomnia 01/20/2021   Malignant neoplasm of lower lobe of left lung ([place]) 12/14/2020   DM type 2 with diabetic peripheral neuropathy ([place]) 06/13/2020   Tremor 06/13/2020   Erectile dysfunction due to arterial insufficiency 09/24/2019   Lumbar degenerative disc disease 06/25/2019   Chronic heart failure with preserved ejection fraction ([place]) 01/19/2019   Severe aortic stenosis    Hyperlipidemia 07/20/2018   Claudication in peripheral vascular disease ([place]) 08/22/2017  Aortic atherosclerosis ([place]) 07/25/2017   Chronic obstructive pulmonary disease ([place]) 01/07/2017   GERD (gastroesophageal reflux disease) 01/07/2017   Essential hypertension 01/07/2017   Sleep apnea with use of continuous positive airway pressure (CPAP) 01/06/2017    Conditions to be addressed/monitored:HTN, COPD, DMII, Depression, and falls and chronic  pain  Care Plan : RNCM: COPD (Adult)  Updates made by Vanita Ingles, RN since 07/13/2021 12:00 AM     Problem: RNCM: Psychological Adjustment to Diagnosis (COPD)   Priority: Medium     Long-Range Goal: RNCM: Management of COPD   Start Date: 10/20/2020  Expected End Date: 02/23/2022  Recent Progress: On track  Priority: Medium  Note:   Current Barriers:  Knowledge deficits related to basic understanding of COPD disease process Knowledge deficits related to basic COPD self care/management Knowledge deficit related to basic understanding of how to use inhalers and how inhaled medications work Knowledge deficit related to importance of energy conservation Limited Social Support Unable to independently manage COPD Lacks social connections Does not contact provider office for questions/concerns  Case Manager Clinical Goal(s):  patient will report using inhalers as prescribed including rinsing mouth after use  patient will report utilizing pursed lip breathing for shortness of breath  patient will be able to verbalize understanding of COPD action plan and when to seek appropriate levels of medical care  patient will engage in lite exercise as tolerated to build/regain stamina and strength and reduce shortness of breath through activity tolerance  patient will verbalize basic understanding of COPD disease process and self care activities patient will not be hospitalized for COPD exacerbation  Interventions:  Collaboration with Jearld Fenton, NP regarding development and update of comprehensive plan of care as evidenced by provider attestation and co-signature Inter-disciplinary care team collaboration (see longitudinal plan of care) Provided patient with basic written and verbal COPD education on self care/management/and exacerbation prevention  Provided patient with COPD action plan and reinforced importance of daily self assessment. 05-18-2021: The patient is asking about portable  oxygen because he is experiencing shortness of breath when he goes out to his MD appointments. He has two large tanks but can not put them on his walker or carry them.  Discussed with the patient having a 6 minute walk test and he does not ever remember doing this. Will collaborate with the pcp and pulmonary provider, Dr. Duwayne Heck the process so the patient can see if he qualifies for portable oxygen to be paid through his insurance. Discussed the process and qualifications to get portable oxygen. The patient verbalized understanding. He did let the RNCM know that he gets his oxygen supplied through [facility] and their number is [phone]. The patient denies any acute distress. Paces his activities. 06-01-2021: The patient wanted to know the status of his portable oxygen. Informed the patient he would need to complete a MD visit with the pulmonary provider and advised him of the return message from Dr. Duwayne Heck. The patient does have a follow up scheduled for 06-26-2021 at 10 am.  The patient reminded to keep his appointment for follow up.  07-13-2021: The patient saw pulmonology and did the walk test and sats remained good. He does not need portable oxygen. Is still using supplemental oxygen when needed. Denies any acute respiratory findings. Will continue to monitor.  Discussed Pulmonary Rehab and offered to assist with referral placement. 05-18-2021: Collaboration with pulmonary specialist related to portable oxygen request. 06-01-2021: Reviewed the information received back from Dr. Patsey Berthold and the patient  verbalized understanding.  Provided written and verbal instructions on pursed lip breathing and utilized returned demonstration as teach back Provided instruction about proper use of medications used for management of COPD including inhalers Advised patient to self assesses COPD action plan zone and make appointment with provider if in the yellow zone for 48 hours without improvement. 07-13-2021: The  patient is aware of triggers that cause exacerbation. Will continue to monitor for changes.  Provided patient with education about the role of exercise in the management of COPD Advised patient to engage in light exercise as tolerated 3-5 days a week. 07-13-2021: Limited mobility and high fall risk.  Provided education about and advised patient to utilize infection prevention strategies to reduce risk of respiratory infection. 05-18-2021: Education and support given to the patient on preventing infection. The patient is having lab work and Production manager today. Will continue to monitor for changes. 07-13-2021: Reviewed with the patient information on monitoring for factors that trigger exacerbation. Will continue to monitor for changes.  Patient Goals/Self-Care Activities:  - counseling provided - decision-making supported - depression screen reviewed - emotional support provided - verbalization of feelings encouraged Follow Up Plan: Telephone follow up appointment with care management team member scheduled for: 09-21-2021 at 1:45 pm    Care Plan : RNCM: Hypertension (Adult)  Updates made by Vanita Ingles, RN since 07/13/2021 12:00 AM     Problem: RNCM: Hypertension (Hypertension)   Priority: Medium     Long-Range Goal: RNCM: Hypertension Monitored   Start Date: 10/20/2020  Expected End Date: 02/20/2022  This Visit's Progress: Not on track  Recent Progress: On track  Priority: Medium  Note:   Objective:  Last practice recorded BP readings:  BP Readings from Last 3 Encounters:  06/29/21 (!) 155/66  06/26/21 (!) 160/80  06/22/21 (!) 198/65   Most recent eGFR/CrCl: No results found for: EGFR  No components found for: CRCL Current Barriers:  Knowledge Deficits related to basic understanding of hypertension pathophysiology and self care management Knowledge Deficits related to understanding of medications prescribed for management of hypertension Limited Social Support Unable to independently  HTN Does not contact provider office for questions/concerns Case Manager Clinical Goal(s):   patient will verbalize understanding of plan for hypertension management patient will attend all scheduled medical appointments: cardiologist 07-24-2021,  patient will demonstrate improved adherence to prescribed treatment plan for hypertension as evidenced by taking all medications as prescribed, monitoring and recording blood pressure as directed, adhering to low sodium/DASH diet patient will demonstrate improved health management independence as evidenced by checking blood pressure as directed and notifying PCP if SBP>160 or DBP > 90, taking all medications as prescribe, and adhering to a low sodium diet as discussed. patient will verbalize basic understanding of hypertension disease process and self health management plan as evidenced by compliance with medications, heart healthy diet, working with CCM team to maintain health and well being. Interventions:  Collaboration with Jearld Fenton, NP regarding development and update of comprehensive plan of care as evidenced by provider attestation and co-signature Inter-disciplinary care team collaboration (see longitudinal plan of care) Evaluation of current treatment plan related to hypertension self management and patient's adherence to plan as established by provider. 05-18-2021: The patient states his blood pressures have been elevated because he has a lot going on with his health right now. The patient is concerned that he just hasn't felt the best. Empathetic listening and support given. The patient had not recently been working with the CCM team as he  had not been able to be reached. The patient provided the [facility] with an additional phone number to call. [phone].  Will update record. Will continue to monitor. 06-01-2021: The patient is getting on track with his health and well being. He denies any acute issues with cardiac health. Is compliant with diet  and other restrictions. Will continue to monitor. 07-13-2021: Expressed concern over the patient and the elevations of blood pressures. The patient states they get like that when he has a lot going on. The patient has an upcoming appointment with cardiology on 07-24-2021. Education on systolic reading being <270 and diastolic <62. Education and support given.  Provided education to patient re: stroke prevention, s/s of heart attack and stroke, DASH diet, complications of uncontrolled blood pressure Reviewed medications with patient and discussed importance of compliance. 07-13-2021: Reviewed with the patient the patient has medications and no issues related to medications cost.  Discussed plans with patient for ongoing care management follow up and provided patient with direct contact information for care management team Advised patient, providing education and rationale, to monitor blood pressure daily and record, calling PCP for findings outside established parameters.  Reviewed scheduled/upcoming provider appointments including:Saw pcp today, has follow up for specialist coming up, having lab work and xrays.  Patient Goals/Self-Care Activities  patient will:  - Self administers medications as prescribed Attends all scheduled provider appointments Calls provider office for new concerns, questions, or BP outside discussed parameters Checks BP and records as discussed Follows a low sodium diet/DASH diet - blood pressure equipment and technique reviewed - blood pressure trends reviewed - depression screen reviewed - home or ambulatory blood pressure monitoring encouraged Follow Up Plan: Telephone follow up appointment with care management team member scheduled for: 09-21-2021 at 1:45 pm    Care Plan : RNCM: Diabetes Type 2 (Adult)  Updates made by Vanita Ingles, RN since 07/13/2021 12:00 AM     Problem: RNCM: Glycemic Management (Diabetes, Type 2)   Priority: Medium     Long-Range Goal:  RNCM: DM-Glycemic Management Optimized   Start Date: 10/20/2020  Expected End Date: 02/23/2022  This Visit's Progress: On track  Recent Progress: On track  Priority: Medium  Note:   Objective:  Lab Results  Component Value Date   HGBA1C 6.6 (H) 05/18/2021   Lab Results  Component Value Date   CREATININE 1.39 (H) 06/29/2021   CREATININE 1.14 06/22/2021   CREATININE 1.16 04/14/2021   No results found for: EGFR Current Barriers:  Knowledge Deficits related to basic Diabetes pathophysiology and self care/management Knowledge Deficits related to medications used for management of diabetes Limited Social Support Unable to independently manage DM Unable to self administer medications as prescribed Lacks social connections Does not contact provider office for questions/concerns Case Manager Clinical Goal(s):  Collaboration with Jearld Fenton, NP regarding development and update of comprehensive plan of care as evidenced by provider attestation and co-signature Inter-disciplinary care team collaboration (see longitudinal plan of care) patient will demonstrate improved adherence to prescribed treatment plan for diabetes self care/management as evidenced by:  daily monitoring and recording of CBG  adherence to ADA/ carb modified diet adherence to prescribed medication regimen Interventions:  Provided education to patient about basic DM disease process Discussed plans with patient for ongoing care management follow up and provided patient with direct contact information for care management team Provided patient with written educational materials related to hypo and hyperglycemia and importance of correct treatment. 05-18-2021: The patient denies any lows today. States  that he is checking regularly and this am it was 152. 07-13-2021: States that his blood sugars are well under 200. Does not like to give insulin at night with his new issue with stomach bloating because he is afraid the needle  is going to break off. Education and support given.  Advised patient, providing education and rationale, to check cbg BID and record, calling pcp for findings outside established parameters.  05-18-2021: States this am reading was 152 and sometimes depending on what he eats it has gone up to 200's but denies any lows. Did ask about a new glucose meter. States his messes up sometimes and parts of it are coming apart. Will collaborate with pcp for help with a new glucose meter. 07-13-2021: Has new meter and is taking blood sugars as directed. Denies any over 200. Denies lows at this time. Collaboration today, 06-01-2021 with the Dr. Alison Stalling office, endocrinologist office and ask the office to please send needed paper work to [employer] in [place] at [phone]. The endocrinologist office is sending a message to the provider because the provider that saw the patient is no longer there. The information provided by [facility] is that they have sent paperwork 5 times without a response from the provider office. The receptionist at Dr. Alison Stalling office is sending a message to the provider requesting paperwork be completed for the patient to get his insoles for his shoes.  Call placed back to the patient and left a detailed message asking the patient to call the endocrinologist office in a couple of days to follow up with paperwork.  Will continue to monitor. 07-13-2021: The patient still does not have needed paperwork. Ask the patient to contact the endocrinologist office and follow up with the provider there. The patient states he has to have the paperwork by 08-14-2021 or has to start the process over again. Support and education provided.   Review of patient status, including review of consultants reports, relevant laboratory and other test results, and medications completed. Patient Goals/Self-Care Activities patient will:  - UNABLE to independently DM Self administers oral medications as  prescribed Attends all scheduled provider appointments Checks blood sugars as prescribed and utilize hyper and hypoglycemia protocol as needed Adheres to prescribed ADA/carb modified - barriers to adherence to treatment plan identified - blood glucose monitoring encouraged - blood glucose readings reviewed - resources required to improve adherence to care identified - self-awareness of signs/symptoms of hypo or hyperglycemia encouraged - use of blood glucose monitoring log promoted Follow Up Plan: Telephone follow up appointment with care management team member scheduled for: 09-21-2021 at 145 pm     Care Plan : RNCM: Fall Risk (Adult)  Updates made by Vanita Ingles, RN since 07/13/2021 12:00 AM     Problem: RNCM: Fall Risk   Priority: High  Onset Date: 05/18/2021     Long-Range Goal: RNCM: Absence of Fall and Fall-Related Injury   Start Date: 05/18/2021  Expected End Date: 05/13/2022  This Visit's Progress: On track  Recent Progress: On track  Priority: High  Note:   Current Barriers:  Knowledge Deficits related to fall precautions in patient with  Decreased adherence to prescribed treatment for fall prevention Unable to independently manage safety and prevent falls in his environment  Lacks social connections Unable to perform IADLs independently Does not contact provider office for questions/concerns Knowledge Deficits related to fall prevention and safety  Care Coordination needs related to needs in the home  in a patient  with high fall risk and multiple chronic conditions.  Chronic Disease Management support and education needs related to preventing falls in patient with unsteady gait, high fall risk, and multiple chronic conditions.  Clinical Goal(s):  patient will demonstrate improved adherence to prescribed treatment plan for decreasing falls as evidenced by patient reporting and review of EMR patient will verbalize using fall risk reduction strategies  discussed patient will not experience additional falls patient will verbalize understanding of plan for management of falls and safety in the home patient will work with [facility] and CCM team  to address needs related to falls prevention and safety patient will demonstrate a decrease in falls exacerbations patient will take all medications exactly as prescribed and will call provider for medication related questions patient will demonstrate improved health management independence patient will demonstrate understanding of rationale for each prescribed medication patient will work with care guides (community agency) to give resources in [place] to assist with in home services to help the patient with his ADL needs  the patient will demonstrate ongoing self health care management ability Interventions:  Collaboration with Jearld Fenton, NP regarding development and update of comprehensive plan of care as evidenced by provider attestation and co-signature Inter-disciplinary care team collaboration (see longitudinal plan of care) Provided written and verbal education re: Potential causes of falls and Fall prevention strategies Reviewed medications and discussed potential side effects of medications such as dizziness and frequent urination. 07-13-2021: Denies any issues with medications causing high risk for falls.  Assessed for s/s of orthostatic hypotension. 07-13-2021: Usual high blood pressure readings. Denies any lows, light headedness or dizziness Assessed for falls since last encounter. 05-18-2021: The patient states he did not fall to the floor but fell on his bed. Is experiencing right hip pain and discomfort. Xray today. 07-13-2021: Denies any new falls at this time.  Assessed patients knowledge of fall risk prevention secondary to previously provided education. Assessed working status of life alert bracelet and patient adherence Provided patient information for fall alert systems-  05-18-2021- has an active fall alert system  Evaluation of current treatment plan related to fall prevention and safety  and patient's adherence to plan as established by provider. Advised patient to call the office for new falls or injuries  Provided education to patient re: being safe in home environment, using DME, keeping life alert system active, and following recommendations of the provider.  Care Guide referral for resources in [facility] to assist with in home help one day a week for ADLs. 06-01-2021: Care guide actively working with the patient  Discussed plans with patient for ongoing care management follow up and provided patient with direct contact information for care management team Self-Care Deficits:  Unable to independently prevent falls  Lacks social connections Unable to perform IADLs independently Does not contact provider office for questions/concerns Patient Goals:  - Utilize walker or cane  (assistive device) appropriately with all ambulation - De-clutter walkways - Change positions slowly - Wear secure fitting shoes at all times with ambulation - Utilize home lighting for dim lit areas - Demonstrate self and pet awareness at all times Follow Up Plan: Telephone follow up appointment with care management team member scheduled for: 09-21-2021 at 1:45 pm    Care Plan : RNCM: Depression (Adult)  Updates made by Vanita Ingles, RN since 07/13/2021 12:00 AM     Problem: RNCM: Depression Identification (Depression)   Priority: High  Onset Date: 05/18/2021     Long-Range Goal: RNCM:  Depressive Symptoms Identified   Start Date: 05/18/2021  Expected End Date: 05/18/2022  This Visit's Progress: On track  Recent Progress: On track  Priority: High  Note:   Current Barriers:  Ineffective Self Health Maintenance in a patient with Depression Unable to independently manage depression due to multiple chronic conditions impacting his care Lacks social connections Does not  contact provider office for questions/concerns Clinical Goal(s):  Collaboration with Jearld Fenton, NP regarding development and update of comprehensive plan of care as evidenced by provider attestation and co-signature Inter-disciplinary care team collaboration (see longitudinal plan of care) patient will work with care management team to address care coordination and chronic disease management needs related to Disease Management Educational Needs Mental Health Counseling   Interventions:  Evaluation of current treatment plan related to Depression, Mental Health Concerns  self-management and patient's adherence to plan as established by provider. 07-13-2021: The patient feels he is doing well with management of his depression at this time. Will continue to monitor for changes. Collaboration with Jearld Fenton, NP regarding development and update of comprehensive plan of care as evidenced by provider attestation       and co-signature Inter-disciplinary care team collaboration (see longitudinal plan of care) Discussed plans with patient for ongoing care management follow up and provided patient with direct contact information for care management team Self Care Activities:  Patient verbalizes understanding of plan to effectively manage depression  Self administers medications as prescribed Attends all scheduled provider appointments Calls pharmacy for medication refills Attends church or other social activities Performs ADL's independently Performs IADL's independently Calls provider office for new concerns or questions Patient Goals: -report any changes in mood, depression, anxiety -do activities that make the patient happy -build on support system and being around people that are positive  Follow Up Plan: Telephone follow up appointment with care management team member scheduled for:  09-21-2021 at 145pm    Care Plan : RNCM: Chronic Pain (Adult)  Updates made by Vanita Ingles, RN  since 07/13/2021 12:00 AM     Problem: RNCM: Chronic Pain Management (Chronic Pain)   Priority: High  Onset Date: 05/18/2021     Long-Range Goal: RNCM: Chronic Pain Managed   Start Date: 05/18/2021  Expected End Date: 05/18/2022  This Visit's Progress: On track  Recent Progress: On track  Priority: High  Note:   Current Barriers:  Knowledge Deficits related to managing acute/chronic pain Non-adherence to scheduled provider appointments Non-adherence to prescribed medication regimen Difficulty obtaining medications Chronic Disease Management support and education needs related to chronic pain Unable to independently manage pain and discomfort in right hip Lacks social connections Does not contact provider office for questions/concerns Nurse Case Manager Clinical Goal(s):  patient will verbalize understanding of plan for managing pain patient will work  with Consulting civil engineer to address new pain concerns and needs  patient will attend all scheduled medical appointments: having xray today, follow up as recommended by the pcp patient will demonstrate use of different relaxation  skills and/or diversional activities to assist with pain reduction (distraction, imagery, relaxation, massage, acupressure, TENS, heat, and cold application patient will report pain at a level less than 3 to 4 on a 10-10 rating scale patient will use pharmacological and nonpharmacological pain relief strategies patient will verbalize acceptable level of pain relief and ability to engage in desired activities patient will engage in desired activities without an increase in pain level Interventions:  Collaboration with Jearld Fenton, NP regarding development and  update of comprehensive plan of care as evidenced by provider attestation and co-signature Inter-disciplinary care team collaboration (see longitudinal plan of care) - careful application of heat or ice encouraged - complementary therapy use encouraged -  deep breathing, relaxation and mindfulness use promoted - effectiveness of pharmacologic therapy monitored - medication-induced side effects managed - misuse of pain medication assessed - motivation and barriers to change assessed and addressed - mutually acceptable comfort goal set - pain assessed - pain treatment goals reviewed - participation in physical therapy encouraged - premedication prior to activity encouraged Evaluation of current treatment plan related to pain in right hip and patient's adherence to plan as established by provider. 07-13-2021- The patient denies any pain today. Will continue to monitor for changes.  Advised patient to call the office for changes in level or intensity of pain Provided education to patient re: effective pain management, alternative methods for pain control, safety and calling the office for changes. Reviewed medications with patient and discussed compliance  Discussed plans with patient for ongoing care management follow up and provided patient with direct contact information for care management team Allow patient to maintain a diary of pain ratings, timing, precipitating events, medications, treatments, and what works best to relieve pain,  Refer to support groups and self-help groups Educate patient about the use of pharmacological interventions for pain management- antianxiety, antidepressants, NSAIDS, opioid analgesics,  Explain the importance of lifestyle modifications to effective pain management  Patient Goals/Self Care Activities:   Self-administers medications as prescribed Attends all scheduled provider appointments Calls pharmacy for medication refills Calls provider office for new concerns or questions Follow Up Plan: Telephone follow up appointment with care management team member scheduled for:09-21-2021 at 145 pm       Plan:Telephone follow up appointment with care management team member scheduled for:  12-19-202 at 145  pm  Noreene Larsson RN, MSN, [place] Mobile: [phone]

## 2021-07-13 NOTE — Patient Instructions (Signed)
Visit Information  PATIENT GOALS:  Goals Addressed             This Visit's Progress    RNCM: Manage Chronic Pain       Timeframe:  Long-Range Goal Priority:  High Start Date:          05-18-2021                   Expected End Date:     05-18-2022                  Follow Up Date 09-21-2021   - call for medicine refill 2 or 3 days before it runs out - develop a personal pain management plan - keep track of prescription refills - plan exercise or activity when pain is best controlled - prioritize tasks for the day - track times pain is worst and when it is best - track what makes the pain worse and what makes it better - use ice or heat for pain relief    Why is this important?   Day-to-day life can be hard when you have chronic pain.  Pain medicine is just one piece of the treatment puzzle.  You can try these action steps to help you manage your pain.    Notes: 05-18-2021: Saw pcp in the office today for right hip pain. Denies any acute distress but states something new is going on and he wants to know what can help. Review of safety. Is using walker with seat. Will continue to monitor. 06-01-2021: Denies any pain issues today. States it is feeling better. Is hopeful the diabetic supports will help. Will continue to monitor. 07-13-2021: Denies any pain today. States that he is still waiting on the endocrinologist to sign off to get his diabetic insoles. Education on following up with endocrinology office.      RNCM: Prevent Falls and Injury       Follow Up Date 09/21/2021    - add more outdoor lighting - always use handrails on the stairs - always wear low-heeled or flat shoes or slippers with nonskid soles - call the doctor if I am feeling too drowsy - install bathroom grab bars - join an exercise group in my community - keep a flashlight by the bed - keep my cell phone with me always - learn how to get back up if I fall - make an emergency alert plan in case I fall - pick  up clutter from the floors - use a nonslip pad with throw rugs, or remove them completely - use a cane or walker - use a nightlight in the bathroom - wear my glasses and/or hearing aid - attend therapy    Why is this important?   Most falls happen when it is hard for you to walk safely. Your balance may be off because of an illness. You may have pain in your knees, hip or other joints.  You may be overly tired or taking medicines that make you sleepy. You may not be able to see or hear clearly.  Falls can lead to broken bones, bruises or other injuries.  There are things you can do to help prevent falling.     Notes: 05-18-2021: The patient had a fall recently to his bed. The patient states he is being careful but is experiencing hip pain and discomfort. Is having xrays in the office today for evaluation of hip pain and discomfort. Review of safety measures. 06-01-2021: Denies  any new falls. The patient states that he is trying to find out the status of his diabetic shoes. He has been fitted for them but he has not heard back from them. Gave the RNCM the number to the place to get his shoes. [phone].  Will follow up with [facility]. [facility] in [place], [place].  Call placed to [facility] and spoke with Dawn to check the status of his diabetic shoes. 07-13-2021: the patient denies any new falls. States he is being careful. Still waiting on diabetic shoe insoles. Will continue to monitor. Has to have paperwork done by 08-14-2021 or will have to start the process over again. Education and support given.      RNCM: Track and Manage My Symptoms-Depression       Timeframe:  Long-Range Goal Priority:  High Start Date:       05-18-2021                      Expected End Date:       05-18-2022                Follow Up Date 09-21-2021    - avoid negative self-talk - develop a personal safety plan - develop a plan to deal with triggers like holidays, anniversaries - have a plan for how to handle  bad days - spend time or talk with others at least 2 to 3 times per week - spend time or talk with others every day - watch for early signs of feeling worse    Why is this important?   Keeping track of your progress will help your treatment team find the right mix of medicine and therapy for you.  Write in your journal every day.  Day-to-day changes in depression symptoms are normal. It may be more helpful to check your progress at the end of each week instead of every day.     Notes: 05-18-2021: The patient states that he is down about all these health issues he has going on. He states he wants to feel better but seems like everything is happening.  He is thankful for the support of his providers and the CCM team. Reflective listening and support given. The patient knows the CCM team is here to help support him and meet his healthcare needs. Will continue to monitor for changes. 06-01-2021: The patient had called and left VM and had also an incoming call from the patient. The patient is trying to get help with several things he has going on. The patient states that he wants to make sure  he is keeping his appointments and following through with the things he needs. Reviewed with him referrals that had been placed and ask him to follow through with calling the dentist and also keeping his appointment with the specialist so he could get his oxygen renewed. The patient verbalized understanding. Will continue to monitor. 07-13-2021: The patient was having a good day today and is going to celebrate his 75th birthday next week. Has several upcoming appointments. States he is trying to keep everything straight.        The patient verbalized understanding of instructions, educational materials, and care plan provided today and declined offer to receive copy of patient instructions, educational materials, and care plan.   Telephone follow up appointment with care management team member scheduled for:  09-21-2021 at 145 pm  Noreene Larsson RN, MSN, [place]  [facility] Mobile: [phone]

## 2021-07-15 ENCOUNTER — Ambulatory Visit
Admission: RE | Admit: 2021-07-15 | Discharge: 2021-07-15 | Disposition: A | Discharge status: Home / Self Care | Payer: Medicare Other | Source: Ambulatory Visit | Attending: Internal Medicine | Admitting: Internal Medicine

## 2021-07-15 ENCOUNTER — Other Ambulatory Visit: Payer: Self-pay

## 2021-07-15 DIAGNOSIS — K921 Melena: Secondary | ICD-10-CM | POA: Insufficient documentation

## 2021-07-15 DIAGNOSIS — R140 Abdominal distension (gaseous): Secondary | ICD-10-CM | POA: Insufficient documentation

## 2021-07-15 DIAGNOSIS — R109 Unspecified abdominal pain: Secondary | ICD-10-CM | POA: Diagnosis not present

## 2021-07-15 DIAGNOSIS — K644 Residual hemorrhoidal skin tags: Secondary | ICD-10-CM | POA: Diagnosis not present

## 2021-07-15 MED ORDER — IOHEXOL 350 MG/ML SOLN
80.0000 mL | Freq: Once | INTRAVENOUS | Status: AC | PRN
  Administered 2021-07-15: 80 mL via INTRAVENOUS

## 2021-07-16 ENCOUNTER — Telehealth: Payer: Self-pay | Admitting: Podiatry

## 2021-07-16 NOTE — Telephone Encounter (Signed)
Pt left a message stating he has someone that will sign off on his shoes now and for me to call him back.Nathaniel Little  He then left a message stating he has a message for the nurse   I returned call and was not able to leave a message for pt.

## 2021-07-21 ENCOUNTER — Other Ambulatory Visit: Payer: Self-pay | Admitting: Internal Medicine

## 2021-07-21 DIAGNOSIS — R140 Abdominal distension (gaseous): Secondary | ICD-10-CM

## 2021-07-21 DIAGNOSIS — G8929 Other chronic pain: Secondary | ICD-10-CM

## 2021-07-23 DIAGNOSIS — J449 Chronic obstructive pulmonary disease, unspecified: Secondary | ICD-10-CM | POA: Diagnosis not present

## 2021-07-23 DIAGNOSIS — I5030 Unspecified diastolic (congestive) heart failure: Secondary | ICD-10-CM | POA: Diagnosis not present

## 2021-07-24 ENCOUNTER — Encounter: Payer: Self-pay | Admitting: Physician Assistant

## 2021-07-24 ENCOUNTER — Ambulatory Visit (INDEPENDENT_AMBULATORY_CARE_PROVIDER_SITE_OTHER): Payer: Medicare Other | Admitting: Physician Assistant

## 2021-07-24 ENCOUNTER — Other Ambulatory Visit: Payer: Self-pay

## 2021-07-24 VITALS — BP 148/78 | HR 62 | Ht 69.0 in | Wt 215.0 lb

## 2021-07-24 DIAGNOSIS — I5033 Acute on chronic diastolic (congestive) heart failure: Secondary | ICD-10-CM

## 2021-07-24 DIAGNOSIS — Z87891 Personal history of nicotine dependence: Secondary | ICD-10-CM

## 2021-07-24 DIAGNOSIS — I10 Essential (primary) hypertension: Secondary | ICD-10-CM

## 2021-07-24 DIAGNOSIS — Z85118 Personal history of other malignant neoplasm of bronchus and lung: Secondary | ICD-10-CM | POA: Diagnosis not present

## 2021-07-24 DIAGNOSIS — E1169 Type 2 diabetes mellitus with other specified complication: Secondary | ICD-10-CM

## 2021-07-24 DIAGNOSIS — I359 Nonrheumatic aortic valve disorder, unspecified: Secondary | ICD-10-CM | POA: Diagnosis not present

## 2021-07-24 DIAGNOSIS — Z952 Presence of prosthetic heart valve: Secondary | ICD-10-CM

## 2021-07-24 DIAGNOSIS — E785 Hyperlipidemia, unspecified: Secondary | ICD-10-CM

## 2021-07-24 DIAGNOSIS — Z79899 Other long term (current) drug therapy: Secondary | ICD-10-CM | POA: Diagnosis not present

## 2021-07-24 DIAGNOSIS — J449 Chronic obstructive pulmonary disease, unspecified: Secondary | ICD-10-CM | POA: Diagnosis not present

## 2021-07-24 DIAGNOSIS — I5032 Chronic diastolic (congestive) heart failure: Secondary | ICD-10-CM | POA: Diagnosis not present

## 2021-07-24 DIAGNOSIS — K219 Gastro-esophageal reflux disease without esophagitis: Secondary | ICD-10-CM | POA: Diagnosis not present

## 2021-07-24 DIAGNOSIS — I739 Peripheral vascular disease, unspecified: Secondary | ICD-10-CM | POA: Diagnosis not present

## 2021-07-24 DIAGNOSIS — G4730 Sleep apnea, unspecified: Secondary | ICD-10-CM | POA: Diagnosis not present

## 2021-07-24 MED ORDER — POTASSIUM CHLORIDE ER 10 MEQ PO TBCR: 30.0000 meq | EXTENDED_RELEASE_TABLET | Freq: Every day | ORAL | 6 refills | Status: DC

## 2021-07-24 MED ORDER — TORSEMIDE 10 MG PO TABS: 10.0000 mg | ORAL_TABLET | Freq: Every day | ORAL | 6 refills | Status: DC

## 2021-07-24 NOTE — Patient Instructions (Addendum)
Medication Instructions:  - Your physician has recommended you make the following change in your medication:   1) START torsemide 10 mg- take 1 tablet by mouth once daily   2) INCREASE potassium 10 meq- take 3 tablets (30 meq) by mouth once daily   *If you need a refill on your cardiac medications before your next appointment, please call your pharmacy*   Lab Work: - Your physician recommends that you have lab work today: BMP/ BNP/ CBC  If you have labs (blood work) drawn today and your tests are completely normal, you will receive your results only by: [place] (if you have [place]) OR A paper copy in the mail If you have any lab test that is abnormal or we need to change your treatment, we will call you to review the results.   Testing/Procedures: - Your physician has requested that you have an echocardiogram. Echocardiography is a painless test that uses sound waves to create images of your heart. It provides your doctor with information about the size and shape of your heart and how well your heart's chambers and valves are working. This procedure takes approximately one hour. There are no restrictions for this procedure. There is a possibility that an IV may need to be started during your test to inject an image enhancing agent. This is done to obtain more optimal pictures of your heart. Therefore we ask that you do at least drink some water prior to coming in to hydrate your veins.     Follow-Up: At [name], you and your health needs are our priority.  As part of our continuing mission to provide you with exceptional heart care, we have created designated Provider Care Teams.  These Care Teams include your primary Cardiologist (physician) and Advanced Practice Providers (APPs -  Physician Assistants and Nurse Practitioners) who all work together to provide you with the care you need, when you need it.  We recommend signing up for the patient portal called "MyChart".   Sign up information is provided on this After Visit Summary.  MyChart is used to connect with patients for Virtual Visits (Telemedicine).  Patients are able to view lab/test results, encounter notes, upcoming appointments, etc.  Non-urgent messages can be sent to your provider as well.   To learn more about what you can do with MyChart, go to [email or web address].    Your next appointment:   2 week(s)  The format for your next appointment:   In Person  Provider:   You may see Ida Rogue, MD or one of the following Advanced Practice Providers on your designated Care Team:   Murray Hodgkins, NP Christell Faith, PA-C Marrianne Mood, PA-C Cadence Kathlen Mody, [place]   Other Instructions N/a

## 2021-07-24 NOTE — Progress Notes (Signed)
Office Visit    Patient Name: Nathaniel Little Date of Encounter: 07/24/2021  Primary Care Provider:  Jearld Fenton, NP Primary Cardiologist:  Ida Rogue, MD  Chief Complaint    Chief Complaint  Patient presents with   Other    6 Month f/u pt could concerned about Breztri not getting full dose when taking. Meds reviewed verbally with pt.    75 yo male with history of HFpEF, severe AS s/p TAVR 04/01/2020, aortic atherosclerosis, HTN, HLD, PAD and s/p stent to GDA and hepatic arteries after common and proper hepatic arteries were found dissected and occluded 07/2018, COPD, current tobacco use, obesity, sleep apnea on CPAP, prostate CA s/p [place], h/o pancreatic mass / IPMN s/p Whipple procedure 07/20/2018, GERD, and here today for preoperative cardiac evaluation prior to L lobectomy for 11/17/20.  Past Medical History    Past Medical History:  Diagnosis Date   (HFpEF) heart failure with preserved ejection fraction ([place])    a. 08/2017 Echo: EF 60-65%, Gr1 DD.   Arthritis    COPD (chronic obstructive pulmonary disease) ([place])    Coronary artery disease 2021   Depression    Diabetes mellitus without complication (HCC)    Hypertension    Incidental pulmonary nodule, greater than or equal to 81m 03/11/2020   Left lower lobe - suspicious for bronchogenic neoplasm   Lymphedema    Morbid obesity ([place]    Prostate cancer ([place] 03/2013   ? PANCREATIC   S/P TAVR (transcatheter aortic valve replacement) 04/01/2020   s/p TAVR with a 29 mm Edwards Sapien 3 THV via the TF approach by Drs MBuena Irish& ORoxy Manns  Severe aortic stenosis    Sleep apnea with use of continuous positive airway pressure (CPAP)    Tobacco abuse    Tremor    HEAD   Past Surgical History:  Procedure Laterality Date   ADENOIDECTOMY     APPENDECTOMY     CARDIAC CATHETERIZATION     CATARACT EXTRACTION W/PHACO Left 11/28/2018   Procedure: CATARACT EXTRACTION PHACO AND INTRAOCULAR LENS  PLACEMENT ([place] LEFT, DIABETIC;  Surgeon: PBirder Robson MD;  Location: [facility];  Service: Ophthalmology;  Laterality: Left;  [place] 00:56 CDE 9.44 Fluid pack lot # [id number]   EUS N/A 10/13/2017   Procedure: FULL UPPER ENDOSCOPIC ULTRASOUND (EUS) RADIAL;  Surgeon: [name];  [facility];  Service: Gastroenterology;  Laterality: N/A;   FLEXIBLE BRONCHOSCOPY N/A 11/19/2020   Procedure: FLEXIBLE BRONCHOSCOPY;  Surgeon: HMelrose Nakayama MD;  [place] [facility]  Service: Thoracic;  Laterality: N/A;   INTERCOSTAL NERVE BLOCK Left 11/17/2020   Procedure: INTERCOSTAL NERVE BLOCK;  Surgeon: HMelrose Nakayama MD;  [place] [facility]  Service: Thoracic;  Laterality: Left;   NODE DISSECTION Left 11/17/2020   Procedure: NODE DISSECTION;  Surgeon: HMelrose Nakayama MD;  [place]  Service: Thoracic;  Laterality: Left;   PANCREATICODUODENECTOMY  07/23/2018   RIGHT/LEFT HEART CATH AND CORONARY ANGIOGRAPHY N/A 02/21/2020   Procedure: RIGHT/LEFT HEART CATH AND CORONARY ANGIOGRAPHY;  Surgeon: GMinna Merritts MD;  [facility];  Service: Cardiovascular;  Laterality: N/A;   TEE WITHOUT CARDIOVERSION N/A 04/01/2020   Procedure: TRANSESOPHAGEAL ECHOCARDIOGRAM (TEE);  Surgeon: MBurnell Blanks MD;  [facility];  Service: Open Heart Surgery;  Laterality: N/A;   TONSILLECTOMY     TRANSCATHETER AORTIC VALVE REPLACEMENT, TRANSFEMORAL N/A 04/01/2020   Procedure: TRANSCATHETER AORTIC VALVE REPLACEMENT, TRANSFEMORAL;  Surgeon: Burnell Blanks, MD;  Location: [place] [facility];  Service: Open Heart Surgery;  Laterality: N/A;   wipple      Allergies  Allergies  Allergen Reactions   Codeine Itching    History of Present Illness    Nathaniel Little is a 75 y.o. male with PMH as above.   He was evaluated in 2018 for intermittent exertional CP and DOE with stress testing without evidence of ischemia.   He was seen by his  primary cardiologist 01/19/2019, at which time he was reportedly doing well. He reported chronic leg pain that sometimes occurred while he slept and sometimes during the day. He was SOB at times with use of inhalers and smoking 2 cigarettes daily. He was able to reportedly walk 18 blocks without issue.   He underwent Whipple procedure 07/2018 by [facility]. Review of EMR notes he presented 07/20/2018 for Whipple for IPMN. Robotic whipple was performed. In the postoperative period, however, he had hematemesis 10/26 concerning for GIB. The common and proper hepatic arteries were found to be dissected and occluded; however, it was also noted that the patient had robust portal flow to the liver. Vascular surgery was consulted and the patient taken to the OR with VIR where a stent was placed in his GDA and hepatic arteries. He was started on Plavix.  Follow-up echo 02/2020 showed significant progression of severe aortic stenosis with preserved LVSF. He was referred to the structural heart team at that time.   Shortly thereafter, he was hospitalized at [facility] with abdominal pain, elevated WBC, and mild lactic acidosis. CT scan of abdomen and pelvis revealed considerable peripancreatic and duodenal inflammatory change, consistent with acute pancreatitis or duodenitis. He was treated with Cipro and Flagyl with resolution of symptoms.   He underwent [facility] 02/21/2020 that revealed nonobstructive CAD with normal LVSF and severe aortic stenosis. There is mild elevation of right heart pressures.  Carotid US study showed mild bilateral carotid dz.  He was seen in consultation by Dr. Angelena Form. CTA performed and revealed a small pulmonary nodule in the left lower lobe, suspicious for bronchogenic neoplasm. Follow-up PET scan 03/2020 showed mild hypermetabolism, raising concern for primary bronchogenic neoplasm. There were no findings suspicious for metastatic disease. There was also a 15 mm hypermetabolic lesion in the left  posterior parotid gland, suggesting benign or malignant parotid neoplasm, possibly Warthin's tumor.  He was evaluated by the structural heart team and underwent successful TAVR 04/01/2020.   Postop EF 60%, normally functioning TAVR with mean gradient 14 mmHg and trivial PVL. He was discharged on ASA and Plavix.   He was last seen 05/01/2020 and reported increased energy and improvement in breathing. SBE prophylaxis discussed. He was continued on ASA and Plavix. It was noted Plavix could be discontinued after 6 months of therapy. Recommendation was for follow-up echo in 1 year.   Referral was made to multidisciplinary thoracic cancer team.  Complete cessation of tobacco recommended.  He has been scheduled for L lower lobectomy on 11/17/20.  He was last seen in clinic 11/07/2018 and before for his left-sided lobectomy 11/17/2020.  He noted good days and bad days.  He had productive cough with white sputum, night sweats, BRBPR, melena, and fatigue.  He was smoking 3 cigarettes/day.  He was cutting back on caffeine and reported 1 soda per month.  He was drinking 3 to 4 bottles of 12 ounce water per day.  He was using salt but agreeable to Mrs. Dash.  We discussed  that [place] salt was also salt.  He reported BRBPR, possibly due to hemorrhoids, and melena, possibly due to iron supplementation.  He noted occasional constipation.  He had adequate urine output, often peeing up to 15 times per day.  He reported bendopnea and intermittent bloating.  He had weight gain and was using 2 pillows each night.  He was monitoring his BP at home and on average 1 19-6 30 systolic with DBP 22-29.  On review of EMR, it was noted that Plavix could be discontinued with patient report that he had already stopped taking the medication.  TAVR team was contacted and confirmed okay to discontinue.  Seen 01/09/2021 and had quit smoking.  He felt improved after his left sided lobectomy.  He had gone down on his home oxygen and no longer  needed oxygen when out and about.  He was drinking 3 to 416 ounce water bottles per day.  He was using 3 pillows for comfort.  He was using Mrs. Dash every now and again, though he liked his pink Himalayan salt.  He reported ongoing melena, possibly due to iron supplementation, as well as scant BRBPR after constipation.  Today, 07/24/2022 (his birthday), he returns to clinic and notes some recent chest pain.  The chest pain is not concerning to him, as it lasts only one half of the second and is described as sharp and atypical chest pain.  He reports he is drinking lots of fluid, as he always feels that he has a dry mouth.  He is currently using 2 pillows and oxygen at night, decreased from previous 3 pillows with oxygen at night.  He has noted recent swelling in his stomach and some early satiety.  He does not feel that he has any swelling in his legs.  He reports his breathing is about the same but also notes that he is coughing up some white mucus.  He also states that he has a nurse that comes around and recently told him that she hears crackles in his lungs.  He denies using any Himalayan salt and reports that he uses Mrs. Dash.  He reports ongoing melena.  No presyncope or syncope.  Home Medications    Prior to Admission medications   Medication Sig Start Date End Date Taking? Authorizing Provider  ACCU-CHEK AVIVA PLUS test strip  05/24/19  Yes [provider]  albuterol (PROVENTIL) (2.5 MG/3ML) 0.083% nebulizer solution Take 2.5 mg by nebulization every 6 (six) hours as needed for wheezing or shortness of breath.   Yes [provider]  albuterol (VENTOLIN HFA) 108 (90 Base) MCG/ACT inhaler Inhale 2 puffs into the lungs every 6 (six) hours as needed for wheezing or shortness of breath. 08/22/19  Yes Karamalegos, Devonne Doughty, DO  aspirin EC 81 MG tablet Take 81 mg by mouth daily.   Yes [provider]  blood glucose meter kit and supplies Dispense based on patient and  insurance preference. Use up to four times daily as directed. (FOR ICD-10 E10.9, E11.9). 08/18/18  Yes [name], NP  cloNIDine (CATAPRES) 0.1 MG tablet TAKE 1 TABLET BY MOUTH TWICE DAILY 01/07/20  Yes [name], [name], FNP  clopidogrel (PLAVIX) 75 MG tablet TAKE 1 TABLET BY MOUTH ONCE DAILY 02/05/20  Yes [name], [name], FNP  diclofenac (VOLTAREN) 75 MG EC tablet TAKE 1 TABLET BY MOUTH TWICE DAILY WITH FOOD AFTER 14 DAYS TAKE ONLY AS NEEDED 01/08/20  Yes [name], [name], FNP  diclofenac Sodium (VOLTAREN) 1 %  GEL Apply 2 g topically 4 (four) times daily. 10/14/19  Yes Johnn Hai, PA-C  Ferrous Sulfate (IRON SUPPLEMENT PO) Take by mouth.   Yes [provider]  gabapentin (NEURONTIN) 100 MG capsule Take 200 mg by mouth at bedtime.  01/29/19  Yes [provider]  hydrochlorothiazide (HYDRODIURIL) 12.5 MG tablet TAKE 1 TABLET BY MOUTH ONCE DAILY 02/05/20  Yes [name], [name], FNP  insulin glargine (LANTUS) 100 unit/mL SOPN Inject 20 Units into the skin at bedtime.   Yes [provider]  ipratropium-albuterol (DUONEB) 0.5-2.5 (3) MG/3ML SOLN Take 3 mLs by nebulization every 6 (six) hours as needed (wheezing, shortness of breath). 04/20/17  Yes [name], NP  metFORMIN (GLUCOPHAGE) 500 MG tablet Take 1,000 mg by mouth 2 (two) times daily.  11/04/18  Yes [provider]  pantoprazole (PROTONIX) 40 MG tablet Take 1 tablet (40 mg total) by mouth 2 (two) times daily. 12/06/19 12/05/20 Yes [name], [name], FNP  SYMBICORT 80-4.5 MCG/ACT inhaler INHALE 2 PUFFS BY MOUTH TWICE DAILY 08/17/19  Yes Karamalegos, Devonne Doughty, DO  tiotropium (SPIRIVA HANDIHALER) 18 MCG inhalation capsule Place 1 capsule (18 mcg total) into inhaler and inhale daily. 11/20/18  Yes [name], NP  traZODone (DESYREL) 100 MG tablet Take 100 mg by mouth at bedtime as needed for sleep.   Yes [provider]  docusate sodium (COLACE) 100 MG capsule Take 100 mg by mouth daily as  needed for mild constipation.    [provider]  sildenafil (REVATIO) 20 MG tablet Take 1-5 pills about 30 min prior to sex. Start with 1 and increase as needed. Patient not taking: Reported on 02/01/2020 08/13/19   Olin Hauser, DO    Review of Systems    He reports atypical chest pain and coughing up clear phlegm.  He states his breathing is about the same but also notes recent crackles reported by the nursing staff.  He reports using 2 pillows on oxygen.  He has swelling in his stomach.  He reports early satiety.  He reports melena.  He has dry mouth and is drinking lots of fluid. He denies palpitations, pnd,, n, v, dizziness, syncope, edema, weight gain.  all other systems reviewed and are otherwise negative except as noted above.  Physical Exam    VS:  BP (!) 148/78 (BP Location: Left Arm, Patient Position: Sitting, Cuff Size: Normal)   Pulse 62   Ht _0  (1.753 m)   Wt 215 lb (97.5 kg)   SpO2 97%   BMI 31.75 kg/m  , BMI Body mass index is 31.75 kg/m. GEN: Well nourished, well developed, in no acute distress.  Seated in chair next to bed. HEENT: normal. Neck: Supple, no JVD, no carotid bruit. no masses. Cardiac: RRR, 3/6 systolic murmur, no rubs or gallops. No clubbing, cyanosis.  Mild bilateral lower extremity edema.  Radials/DP/PT 2+ and equal bilaterally.  Respiratory:  Respirations regular and unlabored, right-sided crackles, breath sounds absent on the left lower side, mild wheezing of bilateral upper lobes, frequent productive sounding cough GI: Slightly firm and distended, BS + x 4. MS: no deformity or atrophy. Skin: warm and dry, no rash. Neuro:  Strength and sensation are intact. Psych: Normal affect.  Accessory Clinical Findings    ECG personally reviewed by me today -SR, 62 bpm, first degree AV block, prior inferior infarct as shown in prior EKGs, cannot rule out anterior infarct, prolonged PR interval- no acute changes.  VITALS Reviewed  today    Temp Readings from Last 3 Encounters:  06/29/21 98.1 F (36.7 C) (Oral)  06/26/21 (!) 97.3 F (36.3 C) (Temporal)  06/22/21 97.7 F (36.5 C) (Temporal)   BP Readings from Last 3 Encounters:  07/24/21 (!) 148/78  06/29/21 (!) 155/66  06/26/21 (!) 160/80   Pulse Readings from Last 3 Encounters:  07/24/21 62  06/29/21 66  06/26/21 62    Wt Readings from Last 3 Encounters:  07/24/21 215 lb (97.5 kg)  06/29/21 209 lb (94.8 kg)  06/26/21 208 lb 6.4 oz (94.5 kg)     LABS  reviewed today    Lab Results  Component Value Date   WBC 8.9 06/29/2021   HGB 12.7 (L) 06/29/2021   HCT 37.6 (L) 06/29/2021   MCV 93.5 06/29/2021   PLT 172 06/29/2021   Lab Results  Component Value Date   CREATININE 1.39 (H) 06/29/2021   BUN 23 06/29/2021   NA 138 06/29/2021   K 4.2 06/29/2021   CL 104 06/29/2021   CO2 25 06/29/2021   Lab Results  Component Value Date   ALT 37 06/29/2021   AST 32 06/29/2021   ALKPHOS 154 (H) 06/29/2021   BILITOT 0.5 06/29/2021   Lab Results  Component Value Date   CHOL 116 05/18/2021   HDL 45 05/18/2021   LDLCALC 53 05/18/2021   TRIG 101 05/18/2021   CHOLHDL 2.6 05/18/2021    Lab Results  Component Value Date   HGBA1C 6.6 (H) 05/18/2021   Lab Results  Component Value Date   TSH 1.22 01/06/2017     STUDIES/PROCEDURES reviewed today   Echo 04/01/21  1. Left ventricular ejection fraction, by estimation, is 60 to 65%. The  left ventricle has normal function. The left ventricle has no regional  wall motion abnormalities. There is mild left ventricular hypertrophy.  Left ventricular diastolic parameters  are consistent with Grade I diastolic dysfunction (impaired relaxation).   2. Right ventricular systolic function is normal. The right ventricular  size is normal.   3. Left atrial size was mildly dilated.   4. Right atrial size was moderately dilated.   5. The mitral valve is grossly normal. No evidence of mitral valve  regurgitation.   6.  The aortic valve has been repaired/replaced. Aortic valve  regurgitation is trivial. There is a 29 mm Edwards Sapien prosthetic  (TAVR) valve present in the aortic position. Procedure Date: 04/01/2020.  Echo findings are consistent with perivalvular leak   of the aortic prosthesis. Aortic valve area, by VTI measures 3.05 cm.  Aortic valve mean gradient measures 10.0 mmHg. Aortic valve Vmax measures  2.23 m/s. Peak gradient 20 mmHg, DI is 0.53.   7. The inferior vena cava is normal in size with greater than 50%  respiratory variability, suggesting right atrial pressure of 3 mmHg.  Echo 05/01/20  1. Left ventricular ejection fraction, by estimation, is 60 to 65%. The  left ventricle has normal function. The left ventricle has no regional  wall motion abnormalities. There is mild left ventricular hypertrophy.  Left ventricular diastolic parameters  are consistent with Grade I diastolic dysfunction (impaired relaxation).   2. Right ventricular systolic function is normal. The right ventricular  size is normal.   3. The mitral valve is normal in structure. Trivial mitral valve  regurgitation. No evidence of mitral stenosis.   4. Post TAVR 04/01/20 with 29 mm Sapien 3 valve. Gradients have increased  since day one post op 04/02/20 mean 14 mm ->  16 mmHg , Peak 28 mm _> 31  mmHg There is a mild PVL that is more apparent than day one post op echo .  The aortic valve has been  repaired/replaced. Aortic valve regurgitation is mild. No aortic stenosis  is present. Procedure Date: 04/01/2020.   5. The inferior vena cava is normal in size with greater than 50%  respiratory variability, suggesting right atrial pressure of 3 mmHg.   Carotid US 03/11/20 Summary:  Right Carotid: Velocities in the right ICA are consistent with a 1-39%  stenosis.  Left Carotid: Velocities in the left ICA are consistent with a 1-39%  stenosis.  Vertebrals:  Bilateral vertebral arteries demonstrate antegrade flow.   Subclavians: Normal flow hemodynamics were seen in bilateral subclavian               arteries.   [facility] 02/21/20 The left ventricular ejection fraction is 55-65% by visual estimate. LV end diastolic pressure is normal. The left ventricular systolic function is normal. There is severe aortic valve stenosis.  Assessment & Plan   Acute on Chronic HFpEF --Reports stable SOB/DOE though appears volume up from previous exam.  His weight is up from his previous 03/2021 TAVR clinic visit with BP mildly elevated. He is s/p lobectomy for lung cancer, which has significantly improved his breathing status.  Most recent echo as above. Recommend repeat echo given sx/volume status/murmur appreciated s/p TAVR.  Reviewed limiting volume/fluid intake and sodium again today. Start torsemide 10 mg daily with increase current K supplementation to KCl tab 20 M EQ daily.  BMET, BNP, CBC today. Further recommendations as indicated pending labs. BMET /BNP at follow-up or in 1-2 weeks.  Essential HTN, BP goal 130/80 or lower --BP today mildly elevated in the setting of mildly elevated volume status.  Recommend initiation of torsemide 10 mg daily with KCl tab as above. BMET today and in 1-2 weeks.  Reviewed salt and fluid intake as above.  Continue current Coreg, clonidine.  Ideally, wean off of clonidine given side effect profile in the near future.  We will continue for now.  Reviewed heart healthy diet/lifestyle changes  S/p TAVR for Severe Aortic stenosis --S/p 03/2020 TAVR procedure with most recent echo findings as above and repeat echo pending given today's harshness of murmur and previous echo s/p TAVR. SBE prophylaxis, though he has no teeth and does not go to the dentist.   Heart rate, BP, cholesterol control recommended.  HLD, goal LDL below 70 --Continue Zetia for risk factor modification. Atorvastatin appears to have fallen off of his list with recommendation for restart today.   PAD / Mild carotid  dz Stenting to GDA and hepatic arteries --As above, he is s/p stenting after 07/29/18 hematemesis and discovered common and proper hepatic arteries found to be dissected and occluded.  Continue PAD/hepatic/carotid follow-up per vein and vascular surgery.  Continue ASA, statin (fell off of list as above with recommendation for restart), and Zetia.  Recommend heart rate, BP, cholesterol, and glycemic control.  Melena, BRBPR  --Reports ongoing melena.  Repeat CBC.  DM2 --Glycemic control recommended for ongoing risk factor modificaton.   Previous tobacco use, COPD --Ongoing cessation encouraged.  Sleep apnea on CPAP --Continued O2 at night encouraged.   Disposition: RTC 1-2 weeks.  Arvil Chaco, PA-C 07/24/2021

## 2021-07-25 DIAGNOSIS — I5030 Unspecified diastolic (congestive) heart failure: Secondary | ICD-10-CM | POA: Diagnosis not present

## 2021-07-25 DIAGNOSIS — J449 Chronic obstructive pulmonary disease, unspecified: Secondary | ICD-10-CM | POA: Diagnosis not present

## 2021-07-25 LAB — CBC
Hematocrit: 38.8 % (ref 37.5–51.0)
Hemoglobin: 13.2 g/dL (ref 13.0–17.7)
MCH: 31.7 pg (ref 26.6–33.0)
MCHC: 34 g/dL (ref 31.5–35.7)
MCV: 93 fL (ref 79–97)
Platelets: 194 10*3/uL (ref 150–450)
RBC: 4.17 x10E6/uL (ref 4.14–5.80)
RDW: 13.4 % (ref 11.6–15.4)
WBC: 10.2 10*3/uL (ref 3.4–10.8)

## 2021-07-25 LAB — BASIC METABOLIC PANEL
BUN/Creatinine Ratio: 18 (ref 10–24)
BUN: 27 mg/dL (ref 8–27)
CO2: 22 mmol/L (ref 20–29)
Calcium: 9.7 mg/dL (ref 8.6–10.2)
Chloride: 104 mmol/L (ref 96–106)
Creatinine, Ser: 1.5 mg/dL — ABNORMAL HIGH (ref 0.76–1.27)
Glucose: 95 mg/dL (ref 70–99)
Potassium: 4.6 mmol/L (ref 3.5–5.2)
Sodium: 141 mmol/L (ref 134–144)
eGFR: 48 mL/min/{1.73_m2} — ABNORMAL LOW (ref >59)

## 2021-07-25 LAB — BRAIN NATRIURETIC PEPTIDE: BNP: 182.4 pg/mL — ABNORMAL HIGH (ref 0.0–100.0)

## 2021-07-27 ENCOUNTER — Telehealth: Payer: Self-pay | Admitting: Physician Assistant

## 2021-07-27 ENCOUNTER — Encounter: Payer: Self-pay | Admitting: Physician Assistant

## 2021-07-27 DIAGNOSIS — I5032 Chronic diastolic (congestive) heart failure: Secondary | ICD-10-CM

## 2021-07-27 NOTE — Telephone Encounter (Signed)
Attempted to call the patient. No answer- voice mail box is full.

## 2021-07-27 NOTE — Telephone Encounter (Signed)
L mom to schedule echo and 2 week follow up

## 2021-07-27 NOTE — Telephone Encounter (Signed)
Nathaniel Little is returning Nathaniel Little's call in regards to his results.

## 2021-07-27 NOTE — Telephone Encounter (Signed)
Arvil Chaco, PA-C  07/27/2021  8:29 AM EDT     BNP suggests volume overload. Labs showed bump in renal function. Blood count stable. Suspect that he does not need a long run of diuresis, given his history.   Please have him hold his torsemide and potassium. Repeat BMET/BNP by Wednesday 10/26 of this week.  Further recommendations at that time regarding torsemide and K, if needed.

## 2021-07-28 ENCOUNTER — Ambulatory Visit: Payer: Self-pay

## 2021-07-28 NOTE — Telephone Encounter (Signed)
Pt requests to speak with a nurse due to being concerned about a red/black discharge from his penis after climaxing from masturbation. Pt requests call back. Cb#  [phone]   Pt. Reports penile itching x 1 week and blood in ejaculate that he noticed yesterday. No other symptoms. Needs 3 days to arrange transportation. Appointment made.    Reason for Disposition  Severe itching  Answer Assessment - Initial Assessment Questions 1. SYMPTOM: "What's the main symptom you're concerned about?" (e.g., discharge from penis, rash, pain, itching, swelling)     Itching, blood in ejaculate 2. LOCATION: "Where is the symptoms located?"     Outside 3. ONSET: "When did 1 week ago  start?"     1 week ago 4. PAIN: "Is there any pain?" If Yes, ask: "How bad is it?"  (Scale 1-10; or mild, moderate, severe)     No 5. URINE: "Any difficulty passing urine?" If Yes, ask: "When was the last time?"     No problems 6. CAUSE: "What do you think is causing the symptoms?"     Unsure 7. OTHER SYMPTOMS: "Do you have any other symptoms?" (e.g., fever, abdominal pain, blood in urine)     No  Protocols used: Penis and Scrotum Symptoms-A-AH

## 2021-07-28 NOTE — Telephone Encounter (Signed)
Patient was returning call to schedule Echo and f/u visit

## 2021-07-28 NOTE — Telephone Encounter (Signed)
Spoke with pt this morning.  Notified of lab results and provider's recc.  Pt has already taken his Torsemide and Potassium this morning (Tuesday 10/25). Pt also expresses transportation difficulty regarding repeat lab work tomorrow (Wed 10/26) as recommended.  Advised pt HOLD Torsemide and Potassium tomorrow and pt agrees he can arrive this Thursday 10/27 for repeat BMET/BNP with transportation set up.   Notified pt I have arranged transportation through [facility].  Address confirmed. Notified pt to answer call from [facility] transport so that they may confirm pick up date/time/address.  Pt voiced understanding and has no further questions.

## 2021-07-29 NOTE — Telephone Encounter (Signed)
Will discuss at upcoming appt.

## 2021-07-30 ENCOUNTER — Other Ambulatory Visit: Payer: Medicare Other

## 2021-07-31 ENCOUNTER — Other Ambulatory Visit: Payer: Self-pay

## 2021-07-31 ENCOUNTER — Encounter: Payer: Medicare Other | Attending: Pulmonary Disease | Admitting: *Deleted

## 2021-07-31 DIAGNOSIS — J449 Chronic obstructive pulmonary disease, unspecified: Secondary | ICD-10-CM

## 2021-07-31 NOTE — Progress Notes (Signed)
Initial telephone orientation completed. Diagnosis can be found in [facility] 9/23. EP orientation scheduled for Monday 11/14 at 1:30

## 2021-07-31 NOTE — Telephone Encounter (Signed)
Marrianne Mood D, PA-C  You 17 hours ago (4:20 PM)   The soonest he is able. Thank you!    You  Marrianne Mood D, PA-C 3 days ago   MS Is it ok if pt gets labs this Thursday 10/27? Pt unable to come in tomorrow.    Spoke with pt. He had missed lab appt that was scheduled 10/25.  Pt states that he did not speak with [facility] transport to set up transportation to lab appt.  Advised pt when he could arrange transportation for lab work and pt agrees he can have someone bring him for lab work on Monday 10/31. Lab appt has been scheduled 08/03/21. Pt has no further questions.

## 2021-08-03 ENCOUNTER — Other Ambulatory Visit (INDEPENDENT_AMBULATORY_CARE_PROVIDER_SITE_OTHER): Payer: Medicare Other

## 2021-08-03 ENCOUNTER — Ambulatory Visit: Payer: Medicare Other | Admitting: Internal Medicine

## 2021-08-03 ENCOUNTER — Other Ambulatory Visit: Payer: Self-pay

## 2021-08-03 DIAGNOSIS — I10 Essential (primary) hypertension: Secondary | ICD-10-CM | POA: Diagnosis not present

## 2021-08-03 DIAGNOSIS — I5032 Chronic diastolic (congestive) heart failure: Secondary | ICD-10-CM

## 2021-08-03 DIAGNOSIS — E1142 Type 2 diabetes mellitus with diabetic polyneuropathy: Secondary | ICD-10-CM | POA: Diagnosis not present

## 2021-08-03 DIAGNOSIS — F324 Major depressive disorder, single episode, in partial remission: Secondary | ICD-10-CM | POA: Diagnosis not present

## 2021-08-03 DIAGNOSIS — J449 Chronic obstructive pulmonary disease, unspecified: Secondary | ICD-10-CM | POA: Diagnosis not present

## 2021-08-03 NOTE — Progress Notes (Signed)
BNP AND BMP---1 FROZEN AND 1 SST DRAWN TODAY

## 2021-08-04 LAB — BASIC METABOLIC PANEL
BUN/Creatinine Ratio: 19 (ref 10–24)
BUN: 24 mg/dL (ref 8–27)
CO2: 24 mmol/L (ref 20–29)
Calcium: 9.3 mg/dL (ref 8.6–10.2)
Chloride: 105 mmol/L (ref 96–106)
Creatinine, Ser: 1.27 mg/dL (ref 0.76–1.27)
Glucose: 171 mg/dL — ABNORMAL HIGH (ref 70–99)
Potassium: 4.5 mmol/L (ref 3.5–5.2)
Sodium: 141 mmol/L (ref 134–144)
eGFR: 59 mL/min/{1.73_m2} — ABNORMAL LOW (ref >59)

## 2021-08-04 LAB — BRAIN NATRIURETIC PEPTIDE: BNP: 83.2 pg/mL (ref 0.0–100.0)

## 2021-08-10 ENCOUNTER — Other Ambulatory Visit: Payer: Self-pay

## 2021-08-10 ENCOUNTER — Ambulatory Visit (INDEPENDENT_AMBULATORY_CARE_PROVIDER_SITE_OTHER): Payer: Medicare Other | Admitting: Internal Medicine

## 2021-08-10 ENCOUNTER — Encounter: Payer: Self-pay | Admitting: Internal Medicine

## 2021-08-10 VITALS — BP 149/63 | HR 84 | Temp 97.5°F | Resp 17 | Ht 69.0 in | Wt 207.0 lb

## 2021-08-10 DIAGNOSIS — R361 Hematospermia: Secondary | ICD-10-CM

## 2021-08-10 DIAGNOSIS — Z8546 Personal history of malignant neoplasm of prostate: Secondary | ICD-10-CM

## 2021-08-10 DIAGNOSIS — B356 Tinea cruris: Secondary | ICD-10-CM | POA: Diagnosis not present

## 2021-08-10 LAB — POCT URINALYSIS DIPSTICK
Bilirubin, UA: NEGATIVE
Blood, UA: NEGATIVE
Glucose, UA: NEGATIVE
Ketones, UA: NEGATIVE
Leukocytes, UA: NEGATIVE
Nitrite, UA: NEGATIVE
Protein, UA: NEGATIVE
Spec Grav, UA: 1.015 (ref 1.010–1.025)
Urobilinogen, UA: 0.2 E.U./dL
pH, UA: 5 (ref 5.0–8.0)

## 2021-08-10 MED ORDER — ACCU-CHEK GUIDE VI STRP: ORAL_STRIP | 12 refills | Status: DC

## 2021-08-10 MED ORDER — KETOCONAZOLE 2 % EX CREA: 1.0000 "application " | TOPICAL_CREAM | Freq: Every day | CUTANEOUS | 0 refills | Status: DC

## 2021-08-10 NOTE — Patient Instructions (Signed)
Semen Analysis Test Why am I having this test? A semen analysis test is done to check certain aspects of the health of a man's reproductive organs (testes) and the hormone system that plays a role in semen production. Semen is a whitish secretion that is released from the penis during the final phase of orgasm (ejaculation). It is made up of liquids and nutrients from the prostate gland, seminal vesicles, and other glands. It also contains sperm cells from the testes. A single sperm cell contains one complete set of a man's genetic coding (chromosomes). You may have this test as a part of infertility testing, which is done to help find out reasons for the inability to have a child. The test may also be done to determine whether a previously performed vasectomy was successful. A vasectomy is a procedure done to make a man permanently infertile by tying the tube (the vas deferens) that collects the sperm from the testicle. A vasectomy blocks the sperm from going through the vas deferens and penis so that the sperm will not go into the vagina during sexual intercourse. What is being tested? If the test is done as part of infertility testing, the shape (morphology), size, and movement (motility) of sperm cells will be included in the analysis. This testing may also include assessing a sperm cell's ability to penetrate an egg (fertilize) as well as the formation of genetic material (DNA). If the test is done to check the success of a vasectomy, the sample will be checked for the presence of sperm. What kind of sample is taken? A semen sample is required for this test. A semen sample will be collected by ejaculation into a sterile glass or plastic container provided by the lab. This can be done at home, in your health care provider's office, or in the lab. How do I collect samples at home? If the sample will be collected at home, follow your health care provider's instructions about how to collect the sample.  The sample should be delivered to the lab within 1 hour after collection. It should also be protected from extreme heat or cold. How do I prepare for this test? For infertility testing: Avoid sexual activity for 2-3 days before the semen sample collection. However, do not avoid ejaculation for a prolonged period because this can alter the motility of sperm cells. For vasectomy success testing: Make sure that you ejaculate one or two times before the day of semen sample collection. This will clear the vas deferens of any sperm that were present before the vasectomy was performed. Tell a health care provider about: All medicines you are taking, including vitamins, herbs, eye drops, creams, and over-the-counter medicines. How are the results reported? Your test results will be reported as values. Your health care provider will compare your results to normal ranges that were established after testing a large group of people (reference values). Reference values may vary among labs and hospitals. For this test, common reference values are: Volume: 2-5 mL. Liquefaction time: 20-30 minutes after collection. Appearance: normal (whitish in color). Motile/mL: greater than or equal to 10 million. Sperm/mL: greater than or equal to 20 million. Viscosity: greater than or equal to 3. Agglutination: greater than or equal to 3. Supravital: greater than or equal to 75% live. Fructose: positive. pH: 7.12-8. Sperm count (density): greater than or equal to 20 million/mL. Sperm motility: greater than or equal to 50% at 1 hour. Sperm morphology: greater than 30% Tyrell Antonio criteria greater than 14%) normally  shaped. What do the results mean? Low sperm count, abnormal motility, or abnormal morphology of sperm cells may all cause problems with male fertility. Abnormal results can also be caused by: Certain infectious diseases, such as inflammation of a testis (orchitis) from mumps. Receiving certain kinds of toxic  drugs, such as chemotherapy. Having testicles that did not develop normally in childhood. When the semen analysis test is done to check the success of a vasectomy, the presence of sperm may mean that the surgery was not successful. Your health care provider may suggest a repeat of this test. Talk with your health care provider about what your results mean. Questions to ask your health care provider Ask your health care provider, or the department that is doing the test: When will my results be ready? How will I get my results? What are my treatment options? What other tests do I need? What are my next steps? Summary A semen analysis test is done to check certain aspects of the health of a man's reproductive organs (testes) and the hormone system that plays a role in semen production. You may have this test as a part of infertility testing, which is done to help find out reasons for the inability to have a child. The test may also be done to determine whether a previously performed vasectomy was successful. A semen sample will be collected by ejaculation into a sterile glass or plastic container. Follow instructions about how to collect the sample, and make sure you deliver the sample to the lab within 1 hour of obtaining it. Talk with your health care provider about what your results mean. This information is not intended to replace advice given to you by your health care provider. Make sure you discuss any questions you have with your health care provider. Document Revised: 02/19/2021 Document Reviewed: 05/23/2020 Elsevier Patient Education  [place].

## 2021-08-10 NOTE — Progress Notes (Signed)
Subjective:    Patient ID: Nathaniel Little, male    DOB: 03/02/1946, 75 y.o.   MRN: [id number]  HPI  Pt presents to the clinic today with c/o blood in his ejaculate. He noticed this 2 times over a 2 week period while masturbating. He denies any penile discharge, odor, dysuria, testicular pain or swelling. He does have a rash on his scrotum that he reports is very itchy. The rash has not seem to spread. He has tried putting powder in his groin area secondary to excessive sweating but he does not feel like this is very effective. He has a history of prostate cancer, s/p radiation and is concerned that his prostate cancer may be returning. He no longer follows with urology.  Review of Systems     Past Medical History:  Diagnosis Date   (HFpEF) heart failure with preserved ejection fraction ([place])    a. 08/2017 Echo: EF 60-65%, Gr1 DD.   Arthritis    COPD (chronic obstructive pulmonary disease) ([place])    Coronary artery disease 2021   Depression    Diabetes mellitus without complication ([facility])    Hypertension    Incidental pulmonary nodule, greater than or equal to 87mm 03/11/2020   Left lower lobe - suspicious for bronchogenic neoplasm   Lymphedema    Morbid obesity ([place])    Prostate cancer ([place]) 03/2013   ? 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     aspirin EC 81 MG tablet Take 81 mg by mouth daily.     BREZTRI AEROSPHERE 160-9-4.8 MCG/ACT AERO Inhale into the lungs.     calcium-vitamin D (OSCAL WITH D) 250-125  MG-UNIT tablet Take 1 tablet by mouth daily.     carvedilol (COREG) 6.25 MG tablet Take 1 tablet (6.25 mg total) by mouth 2 (two) times daily with a meal. 180 tablet 3   cloNIDine (CATAPRES) 0.1 MG tablet Take 1 tablet (0.1 mg total) by mouth 2 (two) times daily. 60 tablet 2   ezetimibe (ZETIA) 10 MG tablet Take 1 tablet (10 mg total) by mouth daily. 90 tablet 3   gabapentin (NEURONTIN) 100 MG capsule Take 1-2 capsules (100-200 mg total) by mouth at bedtime. 180 capsule 1   insulin glargine (LANTUS) 100 unit/mL SOPN Inject 20 Units into the skin at bedtime.     ipratropium-albuterol (DUONEB) 0.5-2.5 (3) MG/3ML SOLN Take 3 mLs by nebulization every 6 (six) hours as needed (Asthma). 360 mL 12   metFORMIN (GLUCOPHAGE) 500 MG tablet Take 1,000 mg by mouth daily with breakfast. 2 in the morning 1 at night.     pantoprazole (PROTONIX) 40 MG tablet Take 1 tablet (40 mg total) by mouth 2 (two) times daily. 60 tablet 11   potassium chloride (KLOR-CON) 10 MEQ tablet Take 3 tablets (30 mEq total) by mouth daily. 90 tablet 6   torsemide (DEMADEX) 10 MG tablet Take 1 tablet (10 mg total) by mouth daily. 30 tablet 6  No current facility-administered medications for this visit.    Allergies  Allergen Reactions   Codeine Itching    Family History  Problem Relation Age of Onset   Breast cancer Mother    Cancer Father        Black Lung   Cerebral palsy Brother    Tuberculosis Paternal Uncle    Prostate cancer Neg Hx    Kidney cancer Neg Hx    Bladder Cancer Neg Hx     Social History   Socioeconomic History   Marital status: Widowed    Spouse name: Not on file   Number of children: 2   Years of education: Not on file   Highest education level: Not on file  Occupational History   Occupation: retired -Patent attorney  Tobacco Use   Smoking status: Every Day    Packs/day: 4.00    Years: 68.00    Pack years: 272.00    Types: Cigarettes   Smokeless tobacco: Former    Types: Chew, Snuff     Quit date: 2000   Tobacco comments:    2-3 cigs a day   Vaping Use   Vaping Use: Never used  Substance and Sexual Activity   Alcohol use: Not Currently    Alcohol/week: 0.0 - 1.0 standard drinks    Comment: quit 40years ago   Drug use: No   Sexual activity: Not Currently  Other Topics Concern   Not on file  Social History Narrative   Living at [place] Strain: Low Risk    Difficulty of Paying Living Expenses: Not hard at all  Food Insecurity: No Food Insecurity   Worried About Charity fundraiser in the Last Year: Never true   [place] in the Last Year: Never true  Transportation Needs: No Transportation Needs   Lack of Transportation (Medical): No   Lack of Transportation (Non-Medical): No  Physical Activity: Inactive   Days of Exercise per Week: 0 days   Minutes of Exercise per Session: 0 min  Stress: Stress Concern Present   Feeling of Stress : To some extent  Social Connections: Moderately Isolated   Frequency of Communication with Friends and Family: More than three times a week   Frequency of Social Gatherings with Friends and Family: More than three times a week   Attends Religious Services: More than 4 times per year   Active Member of [employer] or Organizations: No   Attends Archivist Meetings: Never   Marital Status: Widowed  Human resources officer Violence: Not At Risk   Fear of Current or Ex-Partner: No   Emotionally Abused: No   Physically Abused: No   Sexually Abused: No     Constitutional: Denies fever, malaise, fatigue, headache or abrupt weight changes.   Gastrointestinal: Denies abdominal pain, bloating, constipation, diarrhea or blood in the stool.  GU: Pt reports blood in his ejaculate. Denies urgency, frequency, pain with urination, burning sensation, blood in urine, odor or discharge. Skin: Pt reports rash of scrotum. Denies lesions or ulcercations.    No other specific  complaints in a complete review of systems (except as listed in HPI above).  Objective:   Physical Exam  BP (!) 149/63 (BP Location: Right Arm, Patient Position: Sitting, Cuff Size: Large)   Pulse 84   Temp (!) 97.5 F (36.4 C) (Temporal)   Resp 17   Ht 5\' 9"  (1.753 m)  Wt 207 lb (93.9 kg)   SpO2 98%   BMI 30.57 kg/m   Wt Readings from Last 3 Encounters:  07/24/21 215 lb (97.5 kg)  06/29/21 209 lb (94.8 kg)  06/26/21 208 lb 6.4 oz (94.5 kg)    General: Appears his  stated age, obese, in NAD. Skin: Warm, dry and intact. Red maculopapular rash noted of scrotum. Cardiovascular: Normal rate. Pulmonary/Chest: Slightly increased effort. Abdomen: Soft and nontender.  GU: Normal male anatomy. No bleeding noted from the meatus. Testicles without swelling. Neurological: Alert and oriented.  BMET    Component Value Date/Time   NA 141 08/03/2021 1323   K 4.5 08/03/2021 1323   CL 105 08/03/2021 1323   CO2 24 08/03/2021 1323   GLUCOSE 171 (H) 08/03/2021 1323   GLUCOSE 113 (H) 06/29/2021 1316   BUN 24 08/03/2021 1323   CREATININE 1.27 08/03/2021 1323   CREATININE 1.14 06/22/2021 0832   CALCIUM 9.3 08/03/2021 1323   GFRNONAA 53 (L) 06/29/2021 1316   GFRNONAA 70 12/06/2019 1420   GFRAA 55 (L) 11/07/2020 1534   GFRAA 81 12/06/2019 1420    Lipid Panel     Component Value Date/Time   CHOL 116 05/18/2021 1030   CHOL 149 02/08/2020 1228   TRIG 101 05/18/2021 1030   HDL 45 05/18/2021 1030   HDL 38 (L) 02/08/2020 1228   CHOLHDL 2.6 05/18/2021 1030   LDLCALC 53 05/18/2021 1030    CBC    Component Value Date/Time   WBC 10.2 07/24/2021 1543   WBC 8.9 06/29/2021 1316   RBC 4.17 07/24/2021 1543   RBC 4.02 (L) 06/29/2021 1316   HGB 13.2 07/24/2021 1543   HCT 38.8 07/24/2021 1543   PLT 194 07/24/2021 1543   MCV 93 07/24/2021 1543   MCH 31.7 07/24/2021 1543   MCH 31.6 06/29/2021 1316   MCHC 34.0 07/24/2021 1543   MCHC 33.8 06/29/2021 1316   RDW 13.4 07/24/2021 1543    LYMPHSABS 1.3 06/29/2021 1316   LYMPHSABS 1.1 02/08/2020 1228   MONOABS 0.6 06/29/2021 1316   EOSABS 0.3 06/29/2021 1316   EOSABS 0.1 02/08/2020 1228   BASOSABS 0.1 06/29/2021 1316   BASOSABS 0.0 02/08/2020 1228    Hgb A1C Lab Results  Component Value Date   HGBA1C 6.6 (H) 05/18/2021           Assessment & Plan:    Blood in Semen, History of Prostate Cancer, Rash of Scrotum:  RX for Nizoral 2% cream daily Urinalysis today Will obtain PSA Consider referral to urology pending labs  Will follow up after labs are back with further recommendation and treatment plan.  Webb Silversmith, NP This visit occurred during the SARS-CoV-2 public health emergency.  Safety protocols were in place, including screening questions prior to the visit, additional usage of staff PPE, and extensive cleaning of exam room while observing appropriate contact time as indicated for disinfecting solutions.

## 2021-08-11 ENCOUNTER — Other Ambulatory Visit: Payer: Self-pay

## 2021-08-11 DIAGNOSIS — J431 Panlobular emphysema: Secondary | ICD-10-CM

## 2021-08-11 LAB — PSA: PSA: 3.16 ng/mL (ref <=4.00)

## 2021-08-11 MED ORDER — ALBUTEROL SULFATE HFA 108 (90 BASE) MCG/ACT IN AERS: 2.0000 | INHALATION_SPRAY | Freq: Four times a day (QID) | RESPIRATORY_TRACT | Status: DC | PRN

## 2021-08-12 ENCOUNTER — Telehealth: Payer: Self-pay

## 2021-08-12 NOTE — Telephone Encounter (Signed)
Called patient X1 and VM box was full. Will try again later.

## 2021-08-12 NOTE — Telephone Encounter (Signed)
-----   Message from Theora Gianotti, NP sent at 08/11/2021  5:58 PM EST ----- BNP was normal.  Kidney function and electrolytes also normal.

## 2021-08-13 ENCOUNTER — Telehealth: Payer: Self-pay | Admitting: Cardiovascular Disease

## 2021-08-13 DIAGNOSIS — H26492 Other secondary cataract, left eye: Secondary | ICD-10-CM | POA: Diagnosis not present

## 2021-08-13 LAB — HM DIABETES EYE EXAM

## 2021-08-13 NOTE — Telephone Encounter (Signed)
Spoke with patient He is not ready to schedule these appointments yet States he will call back

## 2021-08-13 NOTE — Telephone Encounter (Signed)
Noted  

## 2021-08-13 NOTE — Telephone Encounter (Signed)
-----   Message from Emily Filbert, RN sent at 08/13/2021  9:00 AM EST ----- This patient saw Jacquelyn on 07/24/21. She wanted him to have an echo and a 2 week follow up, but at that appointment, he wanted to call back to schedule these.  Doesn't look like he has called back.  Do one of you mind reaching out to him to try to set up: - echo - follow up after the echo  Thanks girls!  ----- Message ----- From: Emily Filbert, RN Sent: 08/02/2021  12:00 AM EST To: Emily Filbert, RN  Are his echo & f/u scheduled?

## 2021-08-13 NOTE — Telephone Encounter (Signed)
I spoke with the patient regarding his most recent lab results. He voices understanding and is agreeable.

## 2021-08-17 ENCOUNTER — Other Ambulatory Visit: Payer: Self-pay

## 2021-08-17 ENCOUNTER — Encounter: Payer: Medicare Other | Attending: Pulmonary Disease | Admitting: *Deleted

## 2021-08-17 VITALS — Ht 69.5 in | Wt 208.3 lb

## 2021-08-17 DIAGNOSIS — J449 Chronic obstructive pulmonary disease, unspecified: Secondary | ICD-10-CM | POA: Insufficient documentation

## 2021-08-17 NOTE — Patient Instructions (Signed)
Patient Instructions  Patient Details  Name: Nathaniel Little MRN: [id number] Date of Birth: Apr 29, 1946 Referring Provider:  Tyler Pita, MD  Below are your personal goals for exercise, nutrition, and risk factors. Our goal is to help you stay on track towards obtaining and maintaining these goals. We will be discussing your progress on these goals with you throughout the program.  Initial Exercise Prescription:  Initial Exercise Prescription - 08/17/21 1500       Date of Initial Exercise RX and Referring Provider   Date 08/17/21    Referring Provider Dr. Patsey Berthold      Oxygen   Maintain Oxygen Saturation 88% or higher      NuStep   Level 1    SPM 80    Minutes 15    METs 1.25      Recumbant Elliptical   Level 1    RPM 50    Minutes 15    METs 1.25      Track   Laps 16    Minutes 15    METs 1.25      Prescription Details   Frequency (times per week) 2    Duration Progress to 30 minutes of continuous aerobic without signs/symptoms of physical distress      Intensity   THRR 40-80% of Max Heartrate 100-129    Ratings of Perceived Exertion 11-13    Perceived Dyspnea 0-4      Progression   Progression Continue to progress workloads to maintain intensity without signs/symptoms of physical distress.      Resistance Training   Training Prescription Yes    Weight 2    Reps 10-15             Exercise Goals: Frequency: Be able to perform aerobic exercise two to three times per week in program working toward 2-5 days per week of home exercise.  Intensity: Work with a perceived exertion of 11 (fairly light) - 15 (hard) while following your exercise prescription.  We will make changes to your prescription with you as you progress through the program.   Duration: Be able to do 30 to 45 minutes of continuous aerobic exercise in addition to a 5 minute warm-up and a 5 minute cool-down routine.   Nutrition Goals: Your personal nutrition goals will be established  when you do your nutrition analysis with the dietician.  The following are general nutrition guidelines to follow: Cholesterol < 200mg /day Sodium < 1500mg /day Fiber: Men over 50 yrs - 30 grams per day  Personal Goals:  Personal Goals and Risk Factors at Admission - 08/17/21 1602       Core Components/Risk Factors/Patient Goals on Admission    Weight Management Yes    Intervention Weight Management: Develop a combined nutrition and exercise program designed to reach desired caloric intake, while maintaining appropriate intake of nutrient and fiber, sodium and fats, and appropriate energy expenditure required for the weight goal.;Weight Management: Provide education and appropriate resources to help participant work on and attain dietary goals.;Weight Management/Obesity: Establish reasonable short term and long term weight goals.;Obesity: Provide education and appropriate resources to help participant work on and attain dietary goals.    Admit Weight 208 lb 4.8 oz (94.5 kg)    Goal Weight: Short Term 203 lb (92.1 kg)    Goal Weight: Long Term 198 lb (89.8 kg)    Expected Outcomes Short Term: Continue to assess and modify interventions until short term weight is achieved;Long Term: Adherence to nutrition and physical  activity/exercise program aimed toward attainment of established weight goal;Understanding recommendations for meals to include 15-35% energy as protein, 25-35% energy from fat, 35-60% energy from carbohydrates, less than 200mg  of dietary cholesterol, 20-35 gm of total fiber daily;Understanding of distribution of calorie intake throughout the day with the consumption of 4-5 meals/snacks;Weight Loss: Understanding of general recommendations for a balanced deficit meal plan, which promotes 1-2 lb weight loss per week and includes a negative energy balance of [phone] kcal/d    Tobacco Cessation Yes    Number of packs per day 2-3 cigs a day    Intervention Assist the participant in steps  to quit. Provide individualized education and counseling about committing to Tobacco Cessation, relapse prevention, and pharmacological support that can be provided by physician.;Advice worker, assist with locating and accessing local/national Quit Smoking programs, and support quit date choice.    Expected Outcomes Short Term: Will demonstrate readiness to quit, by selecting a quit date.;Short Term: Will quit all tobacco product use, adhering to prevention of relapse plan.;Long Term: Complete abstinence from all tobacco products for at least 12 months from quit date.    Improve shortness of breath with ADL's Yes    Intervention Provide education, individualized exercise plan and daily activity instruction to help decrease symptoms of SOB with activities of daily living.    Expected Outcomes Short Term: Improve cardiorespiratory fitness to achieve a reduction of symptoms when performing ADLs;Long Term: Be able to perform more ADLs without symptoms or delay the onset of symptoms    Diabetes Yes    Intervention Provide education about signs/symptoms and action to take for hypo/hyperglycemia.;Provide education about proper nutrition, including hydration, and aerobic/resistive exercise prescription along with prescribed medications to achieve blood glucose in normal ranges: Fasting glucose 65-99 mg/dL    Expected Outcomes Short Term: Participant verbalizes understanding of the signs/symptoms and immediate care of hyper/hypoglycemia, proper foot care and importance of medication, aerobic/resistive exercise and nutrition plan for blood glucose control.;Long Term: Attainment of HbA1C < 7%.    Hypertension Yes    Intervention Provide education on lifestyle modifcations including regular physical activity/exercise, weight management, moderate sodium restriction and increased consumption of fresh fruit, vegetables, and low fat dairy, alcohol moderation, and smoking cessation.;Monitor prescription use  compliance.    Expected Outcomes Short Term: Continued assessment and intervention until BP is < 140/89mm HG in hypertensive participants. < 130/47mm HG in hypertensive participants with diabetes, heart failure or chronic kidney disease.;Long Term: Maintenance of blood pressure at goal levels.             Tobacco Use Initial Evaluation: Social History   Tobacco Use  Smoking Status Every Day   Packs/day: 4.00   Years: 68.00   Pack years: 272.00   Types: Cigarettes  Smokeless Tobacco Former   Types: Chew, Snuff   Quit date: 2000  Tobacco Comments   2-3 cigs a day     Exercise Goals and Review:  Exercise Goals     Row Name 08/17/21 1559             Exercise Goals   Increase Physical Activity Yes       Intervention Provide advice, education, support and counseling about physical activity/exercise needs.;Develop an individualized exercise prescription for aerobic and resistive training based on initial evaluation findings, risk stratification, comorbidities and participant's personal goals.       Expected Outcomes Short Term: Attend rehab on a regular basis to increase amount of physical activity.;Long Term: Add in home exercise  to make exercise part of routine and to increase amount of physical activity.;Long Term: Exercising regularly at least 3-5 days a week.       Increase Strength and Stamina Yes       Intervention Provide advice, education, support and counseling about physical activity/exercise needs.;Develop an individualized exercise prescription for aerobic and resistive training based on initial evaluation findings, risk stratification, comorbidities and participant's personal goals.       Expected Outcomes Short Term: Increase workloads from initial exercise prescription for resistance, speed, and METs.;Short Term: Perform resistance training exercises routinely during rehab and add in resistance training at home;Long Term: Improve cardiorespiratory fitness, muscular  endurance and strength as measured by increased METs and functional capacity (6MWT)       Able to understand and use rate of perceived exertion (RPE) scale Yes       Intervention Provide education and explanation on how to use RPE scale       Expected Outcomes Short Term: Able to use RPE daily in rehab to express subjective intensity level;Long Term:  Able to use RPE to guide intensity level when exercising independently       Able to understand and use Dyspnea scale Yes       Intervention Provide education and explanation on how to use Dyspnea scale       Expected Outcomes Short Term: Able to use Dyspnea scale daily in rehab to express subjective sense of shortness of breath during exertion;Long Term: Able to use Dyspnea scale to guide intensity level when exercising independently       Knowledge and understanding of Target Heart Rate Range (THRR) Yes       Intervention Provide education and explanation of THRR including how the numbers were predicted and where they are located for reference       Expected Outcomes Short Term: Able to state/look up THRR;Long Term: Able to use THRR to govern intensity when exercising independently;Short Term: Able to use daily as guideline for intensity in rehab       Able to check pulse independently Yes       Intervention Provide education and demonstration on how to check pulse in carotid and radial arteries.;Review the importance of being able to check your own pulse for safety during independent exercise       Expected Outcomes Short Term: Able to explain why pulse checking is important during independent exercise;Long Term: Able to check pulse independently and accurately       Understanding of Exercise Prescription Yes       Intervention Provide education, explanation, and written materials on patient's individual exercise prescription       Expected Outcomes Short Term: Able to explain program exercise prescription;Long Term: Able to explain home exercise  prescription to exercise independently                Copy of goals given to participant.

## 2021-08-17 NOTE — Progress Notes (Signed)
Pulmonary Individual Treatment Plan  Patient Details  Name: Nathaniel Little MRN: [id number] Date of Birth: 1945/11/19 Referring Provider:   Flowsheet Row Pulmonary Rehab from 08/17/2021 in [facility] Cardiac and Pulmonary Rehab  Referring Provider Dr. Patsey Berthold       Initial Encounter Date:  Flowsheet Row Pulmonary Rehab from 08/17/2021 in [facility] Cardiac and Pulmonary Rehab  Date 08/17/21       Visit Diagnosis: Chronic obstructive pulmonary disease, unspecified COPD type ([place])  Patient's Home Medications on Admission:  Current Outpatient Medications:    glucose blood (ACCU-CHEK GUIDE) test strip, Check blood sugar twice daily, Disp: 100 each, Rfl: 12   albuterol (VENTOLIN HFA) 108 (90 Base) MCG/ACT inhaler, Inhale 2 puffs into the lungs every 6 (six) hours as needed for wheezing or shortness of breath., Disp: , Rfl:    aspirin EC 81 MG tablet, Take 81 mg by mouth daily., Disp: , Rfl:    BREZTRI AEROSPHERE 160-9-4.8 MCG/ACT AERO, Inhale into the lungs., Disp: , Rfl:    calcium-vitamin D (OSCAL WITH D) 250-125 MG-UNIT tablet, Take 1 tablet by mouth daily., Disp: , Rfl:    carvedilol (COREG) 6.25 MG tablet, Take 1 tablet (6.25 mg total) by mouth 2 (two) times daily with a meal., Disp: 180 tablet, Rfl: 3   cloNIDine (CATAPRES) 0.1 MG tablet, Take 1 tablet (0.1 mg total) by mouth 2 (two) times daily., Disp: 60 tablet, Rfl: 2   ezetimibe (ZETIA) 10 MG tablet, Take 1 tablet (10 mg total) by mouth daily., Disp: 90 tablet, Rfl: 3   gabapentin (NEURONTIN) 100 MG capsule, Take 1-2 capsules (100-200 mg total) by mouth at bedtime., Disp: 180 capsule, Rfl: 1   insulin glargine (LANTUS) 100 unit/mL SOPN, Inject 20 Units into the skin at bedtime., Disp: , Rfl:    ipratropium-albuterol (DUONEB) 0.5-2.5 (3) MG/3ML SOLN, Take 3 mLs by nebulization every 6 (six) hours as needed (Asthma)., Disp: 360 mL, Rfl: 12   ketoconazole (NIZORAL) 2 % cream, Apply 1 application topically daily., Disp: 15 g, Rfl: 0    metFORMIN (GLUCOPHAGE) 500 MG tablet, Take 1,000 mg by mouth daily with breakfast. 2 in the morning 1 at night., Disp: , Rfl:    pantoprazole (PROTONIX) 40 MG tablet, Take 1 tablet (40 mg total) by mouth 2 (two) times daily., Disp: 60 tablet, Rfl: 11   potassium chloride (KLOR-CON) 10 MEQ tablet, Take 3 tablets (30 mEq total) by mouth daily., Disp: 90 tablet, Rfl: 6   torsemide (DEMADEX) 10 MG tablet, Take 1 tablet (10 mg total) by mouth daily., Disp: 30 tablet, Rfl: 6  Past Medical History: Past Medical History:  Diagnosis Date   (HFpEF) heart failure with preserved ejection fraction ([place])    a. 08/2017 Echo: EF 60-65%, Gr1 DD.   Arthritis    COPD (chronic obstructive pulmonary disease) ([place])    Coronary artery disease 2021   Depression    Diabetes mellitus without complication (HCC)    Hypertension    Incidental pulmonary nodule, greater than or equal to 65mm 03/11/2020   Left lower lobe - suspicious for bronchogenic neoplasm   Lymphedema    Morbid obesity ([place])    Prostate cancer ([place]) 03/2013   ? PANCREATIC   S/P TAVR (transcatheter aortic valve replacement) 04/01/2020   s/p TAVR with a 29 mm Edwards Sapien 3 THV via the TF approach by Drs Buena Irish & Roxy Manns   Severe aortic stenosis    Sleep apnea with use of continuous positive airway pressure (CPAP)  Tobacco abuse    Tremor    HEAD    Tobacco Use: Social History   Tobacco Use  Smoking Status Every Day   Packs/day: 4.00   Years: 68.00   Pack years: 272.00   Types: Cigarettes  Smokeless Tobacco Former   Types: Chew, Snuff   Quit date: 2000  Tobacco Comments   2-3 cigs a day     Labs: Recent Review Flowsheet Data     Labs for ITP Cardiac and Pulmonary Rehab Latest Ref Rng & Units 09/11/2020 11/14/2020 01/06/2021 04/14/2021 05/18/2021   Cholestrol <200 mg/dL - - 125 - 116   LDLCALC mg/dL (calc) - - 66 - 53   HDL > OR = 40 mg/dL - - 36(L) - 45   Trlycerides <150 mg/dL - - 157(H) - 101   Hemoglobin A1c <5.7 % of total  Hgb 6.3(A) 6.9(H) - - 6.6(H)   PHART 7.350 - 7.450 - 7.432 - - -   PCO2ART 32.0 - 48.0 mmHg - 39.4 - - -   HCO3 20.0 - 28.0 mmol/L - 25.8 - 28.5(H) -   TCO2 22 - 32 mmol/L - - - - -   ACIDBASEDEF 0.0 - 2.0 mmol/L - - - - -   O2SAT % - 95.4 - 67.3 -        Pulmonary Assessment Scores:  Pulmonary Assessment Scores     Row Name 08/17/21 1607         ADL UCSD   SOB Score total 79     Rest 4     Walk 5     Stairs 5     Bath 4     Dress 4     Shop 4       CAT Score   CAT Score 26       mMRC Score   mMRC Score 3              UCSD: Self-administered rating of dyspnea associated with activities of daily living (ADLs) 6-point scale (0 = "not at all" to 5 = "maximal or unable to do because of breathlessness")  Scoring Scores range from 0 to 120.  Minimally important difference is 5 units  CAT: CAT can identify the health impairment of COPD patients and is better correlated with disease progression.  CAT has a scoring range of zero to 40. The CAT score is classified into four groups of low (less than 10), medium (10 - 20), high (21-30) and very high (31-40) based on the impact level of disease on health status. A CAT score over 10 suggests significant symptoms.  A worsening CAT score could be explained by an exacerbation, poor medication adherence, poor inhaler technique, or progression of COPD or comorbid conditions.  CAT MCID is 2 points  mMRC: mMRC (Modified Medical Research Council) Dyspnea Scale is used to assess the degree of baseline functional disability in patients of respiratory disease due to dyspnea. No minimal important difference is established. A decrease in score of 1 point or greater is considered a positive change.   Pulmonary Function Assessment:   Exercise Target Goals: Exercise Program Goal: Individual exercise prescription set using results from initial 6 min walk test and THRR while considering  patient's activity barriers and safety.   Exercise  Prescription Goal: Initial exercise prescription builds to 30-45 minutes a day of aerobic activity, 2-3 days per week.  Home exercise guidelines will be given to patient during program as part of exercise prescription that  the participant will acknowledge.  Education: Aerobic Exercise: - Group verbal and visual presentation on the components of exercise prescription. Introduces F.I.T.T principle from ACSM for exercise prescriptions.  Reviews F.I.T.T. principles of aerobic exercise including progression. Written material given at graduation.   Education: Resistance Exercise: - Group verbal and visual presentation on the components of exercise prescription. Introduces F.I.T.T principle from ACSM for exercise prescriptions  Reviews F.I.T.T. principles of resistance exercise including progression. Written material given at graduation.    Education: Exercise & Equipment Safety: - Individual verbal instruction and demonstration of equipment use and safety with use of the equipment. Flowsheet Row Pulmonary Rehab from 08/17/2021 in [facility] Cardiac and Pulmonary Rehab  Date 08/17/21  Educator [facility]  Instruction Review Code 1- Verbalizes Understanding       Education: Exercise Physiology & General Exercise Guidelines: - Group verbal and written instruction with models to review the exercise physiology of the cardiovascular system and associated critical values. Provides general exercise guidelines with specific guidelines to those with heart or lung disease.  Flowsheet Row Pulmonary Rehab from 08/17/2021 in [facility] Cardiac and Pulmonary Rehab  Education need identified 08/17/21       Education: Flexibility, Balance, Mind/Body Relaxation: - Group verbal and visual presentation with interactive activity on the components of exercise prescription. Introduces F.I.T.T principle from ACSM for exercise prescriptions. Reviews F.I.T.T. principles of flexibility and balance exercise training including progression.  Also discusses the mind body connection.  Reviews various relaxation techniques to help reduce and manage stress (i.e. Deep breathing, progressive muscle relaxation, and visualization). Balance handout provided to take home. Written material given at graduation.   Activity Barriers & Risk Stratification:  Activity Barriers & Cardiac Risk Stratification - 07/31/21 1313       Activity Barriers & Cardiac Risk Stratification   Activity Barriers Joint Problems;Arthritis;Shortness of Breath;Balance Concerns    Comments Walker             6 Minute Walk:  6 Minute Walk     Row Name 08/17/21 1551         6 Minute Walk   Phase Initial     Distance 680 feet     Walk Time 6 minutes     # of Rest Breaks 0     MPH 1.29     METS 1.25     RPE 13     Perceived Dyspnea  2     VO2 Peak 4.4     Symptoms No     Resting HR 72 bpm     Resting BP 140/72     Resting Oxygen Saturation  95 %     Exercise Oxygen Saturation  during 6 min walk 93 %     Max Ex. HR 84 bpm     Max Ex. BP 162/80     2 Minute Post BP 138/80       Interval HR   1 Minute HR 85     2 Minute HR 84     3 Minute HR 84     4 Minute HR 80     5 Minute HR 70     6 Minute HR 83     2 Minute Post HR 74     Interval Heart Rate? Yes       Interval Oxygen   Interval Oxygen? Yes     Baseline Oxygen Saturation % 95 %     1 Minute Oxygen Saturation % 95 %  1 Minute Liters of Oxygen 0 L     2 Minute Oxygen Saturation % 93 %     2 Minute Liters of Oxygen 0 L     3 Minute Oxygen Saturation % 95 %     3 Minute Liters of Oxygen 0 L     4 Minute Oxygen Saturation % 94 %     4 Minute Liters of Oxygen 0 L     5 Minute Oxygen Saturation % 96 %     5 Minute Liters of Oxygen 0 L     6 Minute Oxygen Saturation % 95 %     6 Minute Liters of Oxygen 0 L     2 Minute Post Oxygen Saturation % 95 %     2 Minute Post Liters of Oxygen 0 L             Oxygen Initial Assessment:  Oxygen Initial Assessment - 08/17/21 1609        Home Oxygen   Home Oxygen Device None    Sleep Oxygen Prescription Continuous    Liters per minute 2    Home Exercise Oxygen Prescription None    Home Resting Oxygen Prescription None    Compliance with Home Oxygen Use Yes      Intervention   Short Term Goals To learn and exhibit compliance with exercise, home and travel O2 prescription;To learn and understand importance of monitoring SPO2 with pulse oximeter and demonstrate accurate use of the pulse oximeter.;To learn and understand importance of maintaining oxygen saturations>88%;To learn and demonstrate proper pursed lip breathing techniques or other breathing techniques.     Long  Term Goals Verbalizes importance of monitoring SPO2 with pulse oximeter and return demonstration;Maintenance of O2 saturations>88%;Exhibits proper breathing techniques, such as pursed lip breathing or other method taught during program session;Compliance with respiratory medication             Oxygen Re-Evaluation:   Oxygen Discharge (Final Oxygen Re-Evaluation):   Initial Exercise Prescription:  Initial Exercise Prescription - 08/17/21 1500       Date of Initial Exercise RX and Referring Provider   Date 08/17/21    Referring Provider Dr. Patsey Berthold      Oxygen   Maintain Oxygen Saturation 88% or higher      NuStep   Level 1    SPM 80    Minutes 15    METs 1.25      Recumbant Elliptical   Level 1    RPM 50    Minutes 15    METs 1.25      Track   Laps 16    Minutes 15    METs 1.25      Prescription Details   Frequency (times per week) 2    Duration Progress to 30 minutes of continuous aerobic without signs/symptoms of physical distress      Intensity   THRR 40-80% of Max Heartrate 100-129    Ratings of Perceived Exertion 11-13    Perceived Dyspnea 0-4      Progression   Progression Continue to progress workloads to maintain intensity without signs/symptoms of physical distress.      Resistance Training   Training  Prescription Yes    Weight 2    Reps 10-15             Perform Capillary Blood Glucose checks as needed.  Exercise Prescription Changes:   Exercise Prescription Changes     Row Name 08/17/21 1500  Response to Exercise   Blood Pressure (Admit) 140/72       Blood Pressure (Exercise) 162/80       Blood Pressure (Exit) 138/80       Heart Rate (Admit) 72 bpm       Heart Rate (Exercise) 84 bpm       Heart Rate (Exit) 74 bpm       Oxygen Saturation (Admit) 95 %       Oxygen Saturation (Exercise) 93 %       Oxygen Saturation (Exit) 95 %       Rating of Perceived Exertion (Exercise) 13       Perceived Dyspnea (Exercise) 2       Symptoms none       Comments 6 MWT results                Exercise Comments:   Exercise Goals and Review:   Exercise Goals     Row Name 08/17/21 1559             Exercise Goals   Increase Physical Activity Yes       Intervention Provide advice, education, support and counseling about physical activity/exercise needs.;Develop an individualized exercise prescription for aerobic and resistive training based on initial evaluation findings, risk stratification, comorbidities and participant's personal goals.       Expected Outcomes Short Term: Attend rehab on a regular basis to increase amount of physical activity.;Long Term: Add in home exercise to make exercise part of routine and to increase amount of physical activity.;Long Term: Exercising regularly at least 3-5 days a week.       Increase Strength and Stamina Yes       Intervention Provide advice, education, support and counseling about physical activity/exercise needs.;Develop an individualized exercise prescription for aerobic and resistive training based on initial evaluation findings, risk stratification, comorbidities and participant's personal goals.       Expected Outcomes Short Term: Increase workloads from initial exercise prescription for resistance, speed, and  METs.;Short Term: Perform resistance training exercises routinely during rehab and add in resistance training at home;Long Term: Improve cardiorespiratory fitness, muscular endurance and strength as measured by increased METs and functional capacity (6MWT)       Able to understand and use rate of perceived exertion (RPE) scale Yes       Intervention Provide education and explanation on how to use RPE scale       Expected Outcomes Short Term: Able to use RPE daily in rehab to express subjective intensity level;Long Term:  Able to use RPE to guide intensity level when exercising independently       Able to understand and use Dyspnea scale Yes       Intervention Provide education and explanation on how to use Dyspnea scale       Expected Outcomes Short Term: Able to use Dyspnea scale daily in rehab to express subjective sense of shortness of breath during exertion;Long Term: Able to use Dyspnea scale to guide intensity level when exercising independently       Knowledge and understanding of Target Heart Rate Range (THRR) Yes       Intervention Provide education and explanation of THRR including how the numbers were predicted and where they are located for reference       Expected Outcomes Short Term: Able to state/look up THRR;Long Term: Able to use THRR to govern intensity when exercising independently;Short Term: Able to use daily as guideline for intensity in  rehab       Able to check pulse independently Yes       Intervention Provide education and demonstration on how to check pulse in carotid and radial arteries.;Review the importance of being able to check your own pulse for safety during independent exercise       Expected Outcomes Short Term: Able to explain why pulse checking is important during independent exercise;Long Term: Able to check pulse independently and accurately       Understanding of Exercise Prescription Yes       Intervention Provide education, explanation, and written materials  on patient's individual exercise prescription       Expected Outcomes Short Term: Able to explain program exercise prescription;Long Term: Able to explain home exercise prescription to exercise independently                Exercise Goals Re-Evaluation :   Discharge Exercise Prescription (Final Exercise Prescription Changes):  Exercise Prescription Changes - 08/17/21 1500       Response to Exercise   Blood Pressure (Admit) 140/72    Blood Pressure (Exercise) 162/80    Blood Pressure (Exit) 138/80    Heart Rate (Admit) 72 bpm    Heart Rate (Exercise) 84 bpm    Heart Rate (Exit) 74 bpm    Oxygen Saturation (Admit) 95 %    Oxygen Saturation (Exercise) 93 %    Oxygen Saturation (Exit) 95 %    Rating of Perceived Exertion (Exercise) 13    Perceived Dyspnea (Exercise) 2    Symptoms none    Comments 6 MWT results             Nutrition:  Target Goals: Understanding of nutrition guidelines, daily intake of sodium 1500mg , cholesterol 200mg , calories 30% from fat and 7% or less from saturated fats, daily to have 5 or more servings of fruits and vegetables.  Education: All About Nutrition: -Group instruction provided by verbal, written material, interactive activities, discussions, models, and posters to present general guidelines for heart healthy nutrition including fat, fiber, MyPlate, the role of sodium in heart healthy nutrition, utilization of the nutrition label, and utilization of this knowledge for meal planning. Follow up email sent as well. Written material given at graduation.   Biometrics:  Pre Biometrics - 08/17/21 1600       Pre Biometrics   Height 5' 9.5" (1.765 m)    Weight 208 lb 4.8 oz (94.5 kg)    BMI (Calculated) 30.33    Single Leg Stand 0 seconds              Nutrition Therapy Plan and Nutrition Goals:  Nutrition Therapy & Goals - 08/17/21 1605       Intervention Plan   Intervention Prescribe, educate and counsel regarding individualized  specific dietary modifications aiming towards targeted core components such as weight, hypertension, lipid management, diabetes, heart failure and other comorbidities.    Expected Outcomes Short Term Goal: Understand basic principles of dietary content, such as calories, fat, sodium, cholesterol and nutrients.;Short Term Goal: A plan has been developed with personal nutrition goals set during dietitian appointment.;Long Term Goal: Adherence to prescribed nutrition plan.             Nutrition Assessments:  MEDIFICTS Score Key: ?70 Need to make dietary changes  40-70 Heart Healthy Diet ? 40 Therapeutic Level Cholesterol Diet  Flowsheet Row Pulmonary Rehab from 08/17/2021 in [name] Cardiac and Pulmonary Rehab  Picture Your Plate Total Score on Admission 63  Picture Your Plate Scores: <14 Unhealthy dietary pattern with much room for improvement. 41-50 Dietary pattern unlikely to meet recommendations for good health and room for improvement. 51-60 More healthful dietary pattern, with some room for improvement.  >60 Healthy dietary pattern, although there may be some specific behaviors that could be improved.   Nutrition Goals Re-Evaluation:   Nutrition Goals Discharge (Final Nutrition Goals Re-Evaluation):   Psychosocial: Target Goals: Acknowledge presence or absence of significant depression and/or stress, maximize coping skills, provide positive support system. Participant is able to verbalize types and ability to use techniques and skills needed for reducing stress and depression.   Education: Stress, Anxiety, and Depression - Group verbal and visual presentation to define topics covered.  Reviews how body is impacted by stress, anxiety, and depression.  Also discusses healthy ways to reduce stress and to treat/manage anxiety and depression.  Written material given at graduation.   Education: Sleep Hygiene -Provides group verbal and written instruction about how sleep can  affect your health.  Define sleep hygiene, discuss sleep cycles and impact of sleep habits. Review good sleep hygiene tips.    Initial Review & Psychosocial Screening:  Initial Psych Review & Screening - 07/31/21 1328       Initial Review   Current issues with Current Stress Concerns    Source of Stress Concerns Family;Transportation      Family Dynamics   Good Support System? Yes   friend   Concerns Inappropriate over/under dependence on family/friends      Barriers   Psychosocial barriers to participate in program There are no identifiable barriers or psychosocial needs.;The patient should benefit from training in stress management and relaxation.      Screening Interventions   Interventions Encouraged to exercise;Provide feedback about the scores to participant;To provide support and resources with identified psychosocial needs    Expected Outcomes Short Term goal: Utilizing psychosocial counselor, staff and physician to assist with identification of specific Stressors or current issues interfering with healing process. Setting desired goal for each stressor or current issue identified.;Long Term Goal: Stressors or current issues are controlled or eliminated.;Short Term goal: Identification and review with participant of any Quality of Life or Depression concerns found by scoring the questionnaire.;Long Term goal: The participant improves quality of Life and PHQ9 Scores as seen by post scores and/or verbalization of changes             Quality of Life Scores:  Scores of 19 and below usually indicate a poorer quality of life in these areas.  A difference of  2-3 points is a clinically meaningful difference.  A difference of 2-3 points in the total score of the Quality of Life Index has been associated with significant improvement in overall quality of life, self-image, physical symptoms, and general health in studies assessing change in quality of life.  PHQ-9: Recent Review  Flowsheet Data     Depression screen [facility] 2/9 08/17/2021 05/18/2021 12/09/2020 03/31/2020 12/04/2019   Decreased Interest 0 3 0 0 0   Down, Depressed, Hopeless 3 0 0 0 1   PHQ - 2 Score 3 3 0 0 1   Altered sleeping 3 3 - - -   Tired, decreased energy 2 2 - - -   Change in appetite 3 3 - - -   Feeling bad or failure about yourself  3 0 - - -   Trouble concentrating 0 0 - - -   Moving slowly or fidgety/restless 0 0 - - -  Suicidal thoughts 0 3 - - -   PHQ-9 Score 14 14 - - -   Difficult doing work/chores Somewhat difficult Not difficult at all - - -      Interpretation of Total Score  Total Score Depression Severity:  1-4 = Minimal depression, 5-9 = Mild depression, 10-14 = Moderate depression, 15-19 = Moderately severe depression, 20-27 = Severe depression   Psychosocial Evaluation and Intervention:  Psychosocial Evaluation - 07/31/21 1338       Psychosocial Evaluation & Interventions   Interventions Encouraged to exercise with the program and follow exercise prescription;Stress management education    Comments Asaph is coming to pulmonary rehab for COPD. He has been placed on O2 at night and sometimes will put himself on it during the day if he is feeling sluggish. He does report some stress concerns related to transportation and family. His friend who used to drive him places is also dealing with health issues so [place] has been using [employer] more. Regarding family, he states he has been having flashback dreams about his wife who died 8 years ago. This disrupts his sleep which is already struggling since he only gets about 3-4 hours of sleep a night (has been his norm since high school). His other family stressor is his daughter who he states he doesn't want to claim her as such after the way she has treated him, including selling some of his checks which he turned her in to the government for. She is local, but he hasnt seen her in 4 years. He lives in [place] where he enjoys the dance and  yoga classes they offer. 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 He is hopeful this program will boost his stamina and improve his breathing.    Expected Outcomes Short; attend pulmonary rehab for education and exercise. Long: develop and maintain positive self care habits    Continue Psychosocial Services  Follow up required by staff             Psychosocial Re-Evaluation:   Psychosocial Discharge (Final Psychosocial Re-Evaluation):   Education: Education Goals: Education classes will be provided on a weekly basis, covering required topics. Participant will state understanding/return demonstration of topics presented.  Learning Barriers/Preferences:  Learning Barriers/Preferences - 07/31/21 1327       Learning Barriers/Preferences   Learning Barriers None    Learning Preferences Individual Instruction             General Pulmonary Education Topics:  Infection Prevention: - Provides verbal and written material to individual with discussion of infection control including proper hand washing and proper equipment cleaning during exercise session. Flowsheet Row Pulmonary Rehab from 08/17/2021 in [facility] Cardiac and Pulmonary Rehab  Date 08/17/21  Educator [facility]  Instruction Review Code 1- Verbalizes Understanding       Falls Prevention: - Provides verbal and written material to individual with discussion of falls prevention and safety. Flowsheet Row Pulmonary Rehab from 08/17/2021 in [facility] Cardiac and Pulmonary Rehab  Date 08/17/21  Educator [facility]  Instruction Review Code 1- Verbalizes Understanding       Chronic Lung Disease Review: - Group verbal instruction with posters, models, PowerPoint presentations and videos,  to review new updates, new respiratory medications, new advancements in procedures and treatments. Providing information on websites and "800" numbers for continued self-education. Includes  information about supplement oxygen, available portable oxygen systems, continuous and intermittent flow rates, oxygen safety, concentrators, and  Medicare reimbursement for oxygen. Explanation of Pulmonary Drugs, including class, frequency, complications, importance of spacers, rinsing mouth after steroid MDI's, and proper cleaning methods for nebulizers. Review of basic lung anatomy and physiology related to function, structure, and complications of lung disease. Review of risk factors. Discussion about methods for diagnosing sleep apnea and types of masks and machines for OSA. Includes a review of the use of types of environmental controls: home humidity, furnaces, filters, dust mite/pet prevention, HEPA vacuums. Discussion about weather changes, air quality and the benefits of nasal washing. Instruction on Warning signs, infection symptoms, calling MD promptly, preventive modes, and value of vaccinations. Review of effective airway clearance, coughing and/or vibration techniques. Emphasizing that all should Create an Action Plan. Written material given at graduation. Flowsheet Row Pulmonary Rehab from 08/17/2021 in [facility] Cardiac and Pulmonary Rehab  Education need identified 08/17/21       AED/CPR: - Group verbal and written instruction with the use of models to demonstrate the basic use of the AED with the basic ABC's of resuscitation.    Anatomy and Cardiac Procedures: - Group verbal and visual presentation and models provide information about basic cardiac anatomy and function. Reviews the testing methods done to diagnose heart disease and the outcomes of the test results. Describes the treatment choices: Medical Management, Angioplasty, or Coronary Bypass Surgery for treating various heart conditions including Myocardial Infarction, Angina, Valve Disease, and Cardiac Arrhythmias.  Written material given at graduation.   Medication Safety: - Group verbal and visual instruction to review  commonly prescribed medications for heart and lung disease. Reviews the medication, class of the drug, and side effects. Includes the steps to properly store meds and maintain the prescription regimen.  Written material given at graduation.   Other: -Provides group and verbal instruction on various topics (see comments)   Knowledge Questionnaire Score:  Knowledge Questionnaire Score - 08/17/21 1606       Knowledge Questionnaire Score   Pre Score 9/18              Core Components/Risk Factors/Patient Goals at Admission:  Personal Goals and Risk Factors at Admission - 08/17/21 1602       Core Components/Risk Factors/Patient Goals on Admission    Weight Management Yes    Intervention Weight Management: Develop a combined nutrition and exercise program designed to reach desired caloric intake, while maintaining appropriate intake of nutrient and fiber, sodium and fats, and appropriate energy expenditure required for the weight goal.;Weight Management: Provide education and appropriate resources to help participant work on and attain dietary goals.;Weight Management/Obesity: Establish reasonable short term and long term weight goals.;Obesity: Provide education and appropriate resources to help participant work on and attain dietary goals.    Admit Weight 208 lb 4.8 oz (94.5 kg)    Goal Weight: Short Term 203 lb (92.1 kg)    Goal Weight: Long Term 198 lb (89.8 kg)    Expected Outcomes Short Term: Continue to assess and modify interventions until short term weight is achieved;Long Term: Adherence to nutrition and physical activity/exercise program aimed toward attainment of established weight goal;Understanding recommendations for meals to include 15-35% energy as protein, 25-35% energy from fat, 35-60% energy from carbohydrates, less than 200mg  of dietary cholesterol, 20-35 gm of total fiber daily;Understanding of distribution of calorie intake throughout the day with the consumption of 4-5  meals/snacks;Weight Loss: Understanding of general recommendations for a balanced deficit meal plan, which promotes 1-2 lb weight loss per week and includes a negative energy balance of  [id number] kcal/d    Tobacco Cessation Yes    Number of packs per day 2-3 cigs a day    Intervention Assist the participant in steps to quit. Provide individualized education and counseling about committing to Tobacco Cessation, relapse prevention, and pharmacological support that can be provided by physician.;Advice worker, assist with locating and accessing local/national Quit Smoking programs, and support quit date choice.    Expected Outcomes Short Term: Will demonstrate readiness to quit, by selecting a quit date.;Short Term: Will quit all tobacco product use, adhering to prevention of relapse plan.;Long Term: Complete abstinence from all tobacco products for at least 12 months from quit date.    Improve shortness of breath with ADL's Yes    Intervention Provide education, individualized exercise plan and daily activity instruction to help decrease symptoms of SOB with activities of daily living.    Expected Outcomes Short Term: Improve cardiorespiratory fitness to achieve a reduction of symptoms when performing ADLs;Long Term: Be able to perform more ADLs without symptoms or delay the onset of symptoms    Diabetes Yes    Intervention Provide education about signs/symptoms and action to take for hypo/hyperglycemia.;Provide education about proper nutrition, including hydration, and aerobic/resistive exercise prescription along with prescribed medications to achieve blood glucose in normal ranges: Fasting glucose 65-99 mg/dL    Expected Outcomes Short Term: Participant verbalizes understanding of the signs/symptoms and immediate care of hyper/hypoglycemia, proper foot care and importance of medication, aerobic/resistive exercise and nutrition plan for blood glucose control.;Long Term: Attainment of HbA1C <  7%.    Hypertension Yes    Intervention Provide education on lifestyle modifcations including regular physical activity/exercise, weight management, moderate sodium restriction and increased consumption of fresh fruit, vegetables, and low fat dairy, alcohol moderation, and smoking cessation.;Monitor prescription use compliance.    Expected Outcomes Short Term: Continued assessment and intervention until BP is < 140/43mm HG in hypertensive participants. < 130/88mm HG in hypertensive participants with diabetes, heart failure or chronic kidney disease.;Long Term: Maintenance of blood pressure at goal levels.             Education:Diabetes - Individual verbal and written instruction to review signs/symptoms of diabetes, desired ranges of glucose level fasting, after meals and with exercise. Acknowledge that pre and post exercise glucose checks will be done for 3 sessions at entry of program. Flowsheet Row Pulmonary Rehab from 07/31/2021 in [facility] Cardiac and Pulmonary Rehab  Date 07/31/21  Educator [facility]  Instruction Review Code 1- Verbalizes Understanding       Know Your Numbers and Heart Failure: - Group verbal and visual instruction to discuss disease risk factors for cardiac and pulmonary disease and treatment options.  Reviews associated critical values for Overweight/Obesity, Hypertension, Cholesterol, and Diabetes.  Discusses basics of heart failure: signs/symptoms and treatments.  Introduces Heart Failure Zone chart for action plan for heart failure.  Written material given at graduation.   Core Components/Risk Factors/Patient Goals Review:    Core Components/Risk Factors/Patient Goals at Discharge (Final Review):    ITP Comments:  ITP Comments     Row Name 07/31/21 1323 08/17/21 1548         ITP Comments Initial telephone orientation completed. Diagnosis can be found in [facility] 9/23. EP orientation scheduled for Monday 11/14 at 1:30 Completed 6MWT and gym orientation. Initial ITP  created and sent for review to Dr. Zetta Bills, Medical Director.               Comments: initial ITP

## 2021-08-22 ENCOUNTER — Other Ambulatory Visit: Payer: Self-pay | Admitting: Pulmonary Disease

## 2021-08-23 DIAGNOSIS — I5030 Unspecified diastolic (congestive) heart failure: Secondary | ICD-10-CM | POA: Diagnosis not present

## 2021-08-23 DIAGNOSIS — J449 Chronic obstructive pulmonary disease, unspecified: Secondary | ICD-10-CM | POA: Diagnosis not present

## 2021-08-25 DIAGNOSIS — I5030 Unspecified diastolic (congestive) heart failure: Secondary | ICD-10-CM | POA: Diagnosis not present

## 2021-08-25 DIAGNOSIS — J449 Chronic obstructive pulmonary disease, unspecified: Secondary | ICD-10-CM | POA: Diagnosis not present

## 2021-09-01 ENCOUNTER — Other Ambulatory Visit: Payer: Self-pay

## 2021-09-01 DIAGNOSIS — J449 Chronic obstructive pulmonary disease, unspecified: Secondary | ICD-10-CM | POA: Diagnosis not present

## 2021-09-01 LAB — GLUCOSE, CAPILLARY
Glucose-Capillary: 188 mg/dL — ABNORMAL HIGH (ref 70–99)
Glucose-Capillary: 141 mg/dL — ABNORMAL HIGH (ref 70–99)

## 2021-09-01 NOTE — Progress Notes (Signed)
Daily Session Note  Patient Details  Name: Nathaniel Little MRN: [id number] Date of Birth: 12-May-1946 Referring Provider:   Flowsheet Row Pulmonary Rehab from 08/17/2021 in [facility] Cardiac and Pulmonary Rehab  Referring Provider Dr. Patsey Berthold       Encounter Date: 09/01/2021  Check In:  Session Check In - 09/01/21 0915       Check-In   Supervising physician immediately available to respond to emergencies See telemetry face sheet for immediately available ER MD    Location [facility]-Cardiac & Pulmonary Rehab    Staff Present Birdie Sons, MPA, RN;Jessica Luan Pulling, MA, RCEP, CCRP, CCET;Amanda Sommer, BA, ACSM CEP, Exercise Physiologist    Virtual Visit No    Medication changes reported     No    Fall or balance concerns reported    No    Tobacco Cessation No Change    Warm-up and Cool-down Performed on first and last piece of equipment    Resistance Training Performed Yes    VAD Patient? No    PAD/SET Patient? No      Pain Assessment   Currently in Pain? No/denies                Social History   Tobacco Use  Smoking Status Every Day   Packs/day: 4.00   Years: 68.00   Pack years: 272.00   Types: Cigarettes  Smokeless Tobacco Former   Types: Chew, Snuff   Quit date: 2000  Tobacco Comments   2-3 cigs a day     Goals Met:  Independence with exercise equipment Exercise tolerated well No report of concerns or symptoms today Strength training completed today  Goals Unmet:  Not Applicable  Comments: First full day of exercise!  Patient was oriented to gym and equipment including functions, settings, policies, and procedures.  Patient's individual exercise prescription and treatment plan were reviewed.  All starting workloads were established based on the results of the 6 minute walk test done at initial orientation visit.  The plan for exercise progression was also introduced and progression will be customized based on patient's performance and goals.    Dr. Emily Filbert is Medical Director for [place].  Dr. Ottie Glazier is Medical Director for [facility] Pulmonary Rehabilitation.

## 2021-09-02 ENCOUNTER — Encounter: Payer: Self-pay | Admitting: *Deleted

## 2021-09-02 DIAGNOSIS — J449 Chronic obstructive pulmonary disease, unspecified: Secondary | ICD-10-CM

## 2021-09-02 NOTE — Progress Notes (Signed)
Pulmonary Individual Treatment Plan  Patient Details  Name: Nathaniel Little MRN: [id number] Date of Birth: December 31, 1945 Referring Provider:   Flowsheet Row Pulmonary Rehab from 08/17/2021 in [facility] Cardiac and Pulmonary Rehab  Referring Provider Dr. Patsey Berthold       Initial Encounter Date:  Flowsheet Row Pulmonary Rehab from 08/17/2021 in [facility] Cardiac and Pulmonary Rehab  Date 08/17/21       Visit Diagnosis: Chronic obstructive pulmonary disease, unspecified COPD type ([place])  Patient's Home Medications on Admission:  Current Outpatient Medications:    glucose blood (ACCU-CHEK GUIDE) test strip, Check blood sugar twice daily, Disp: 100 each, Rfl: 12   albuterol (VENTOLIN HFA) 108 (90 Base) MCG/ACT inhaler, Inhale 2 puffs into the lungs every 6 (six) hours as needed for wheezing or shortness of breath., Disp: , Rfl:    aspirin EC 81 MG tablet, Take 81 mg by mouth daily., Disp: , Rfl:    BREZTRI AEROSPHERE 160-9-4.8 MCG/ACT AERO, Inhale into the lungs., Disp: , Rfl:    calcium-vitamin D (OSCAL WITH D) 250-125 MG-UNIT tablet, Take 1 tablet by mouth daily., Disp: , Rfl:    carvedilol (COREG) 6.25 MG tablet, Take 1 tablet (6.25 mg total) by mouth 2 (two) times daily with a meal., Disp: 180 tablet, Rfl: 3   cloNIDine (CATAPRES) 0.1 MG tablet, Take 1 tablet (0.1 mg total) by mouth 2 (two) times daily., Disp: 60 tablet, Rfl: 2   ezetimibe (ZETIA) 10 MG tablet, Take 1 tablet (10 mg total) by mouth daily., Disp: 90 tablet, Rfl: 3   gabapentin (NEURONTIN) 100 MG capsule, Take 1-2 capsules (100-200 mg total) by mouth at bedtime., Disp: 180 capsule, Rfl: 1   insulin glargine (LANTUS) 100 unit/mL SOPN, Inject 20 Units into the skin at bedtime., Disp: , Rfl:    ipratropium-albuterol (DUONEB) 0.5-2.5 (3) MG/3ML SOLN, Take 3 mLs by nebulization every 6 (six) hours as needed (Asthma)., Disp: 360 mL, Rfl: 12   ketoconazole (NIZORAL) 2 % cream, Apply 1 application topically daily., Disp: 15 g, Rfl: 0    metFORMIN (GLUCOPHAGE) 500 MG tablet, Take 1,000 mg by mouth daily with breakfast. 2 in the morning 1 at night., Disp: , Rfl:    pantoprazole (PROTONIX) 40 MG tablet, Take 1 tablet (40 mg total) by mouth 2 (two) times daily., Disp: 60 tablet, Rfl: 11   potassium chloride (KLOR-CON) 10 MEQ tablet, Take 3 tablets (30 mEq total) by mouth daily., Disp: 90 tablet, Rfl: 6   torsemide (DEMADEX) 10 MG tablet, Take 1 tablet (10 mg total) by mouth daily., Disp: 30 tablet, Rfl: 6  Past Medical History: Past Medical History:  Diagnosis Date   (HFpEF) heart failure with preserved ejection fraction ([place])    a. 08/2017 Echo: EF 60-65%, Gr1 DD.   Arthritis    COPD (chronic obstructive pulmonary disease) ([place])    Coronary artery disease 2021   Depression    Diabetes mellitus without complication (HCC)    Hypertension    Incidental pulmonary nodule, greater than or equal to 41mm 03/11/2020   Left lower lobe - suspicious for bronchogenic neoplasm   Lymphedema    Morbid obesity ([place])    Prostate cancer ([place]) 03/2013   ? PANCREATIC   S/P TAVR (transcatheter aortic valve replacement) 04/01/2020   s/p TAVR with a 29 mm Edwards Sapien 3 THV via the TF approach by Drs Buena Irish & Roxy Manns   Severe aortic stenosis    Sleep apnea with use of continuous positive airway pressure (CPAP)  Tobacco abuse    Tremor    HEAD    Tobacco Use: Social History   Tobacco Use  Smoking Status Every Day   Packs/day: 4.00   Years: 68.00   Pack years: 272.00   Types: Cigarettes  Smokeless Tobacco Former   Types: Chew, Snuff   Quit date: 2000  Tobacco Comments   2-3 cigs a day     Labs: Recent Review Flowsheet Data     Labs for ITP Cardiac and Pulmonary Rehab Latest Ref Rng & Units 09/11/2020 11/14/2020 01/06/2021 04/14/2021 05/18/2021   Cholestrol <200 mg/dL - - 125 - 116   LDLCALC mg/dL (calc) - - 66 - 53   HDL > OR = 40 mg/dL - - 36(L) - 45   Trlycerides <150 mg/dL - - 157(H) - 101   Hemoglobin A1c <5.7 % of total  Hgb 6.3(A) 6.9(H) - - 6.6(H)   PHART 7.350 - 7.450 - 7.432 - - -   PCO2ART 32.0 - 48.0 mmHg - 39.4 - - -   HCO3 20.0 - 28.0 mmol/L - 25.8 - 28.5(H) -   TCO2 22 - 32 mmol/L - - - - -   ACIDBASEDEF 0.0 - 2.0 mmol/L - - - - -   O2SAT % - 95.4 - 67.3 -        Pulmonary Assessment Scores:  Pulmonary Assessment Scores     Row Name 08/17/21 1607         ADL UCSD   SOB Score total 79     Rest 4     Walk 5     Stairs 5     Bath 4     Dress 4     Shop 4       CAT Score   CAT Score 26       mMRC Score   mMRC Score 3              UCSD: Self-administered rating of dyspnea associated with activities of daily living (ADLs) 6-point scale (0 = "not at all" to 5 = "maximal or unable to do because of breathlessness")  Scoring Scores range from 0 to 120.  Minimally important difference is 5 units  CAT: CAT can identify the health impairment of COPD patients and is better correlated with disease progression.  CAT has a scoring range of zero to 40. The CAT score is classified into four groups of low (less than 10), medium (10 - 20), high (21-30) and very high (31-40) based on the impact level of disease on health status. A CAT score over 10 suggests significant symptoms.  A worsening CAT score could be explained by an exacerbation, poor medication adherence, poor inhaler technique, or progression of COPD or comorbid conditions.  CAT MCID is 2 points  mMRC: mMRC (Modified Medical Research Council) Dyspnea Scale is used to assess the degree of baseline functional disability in patients of respiratory disease due to dyspnea. No minimal important difference is established. A decrease in score of 1 point or greater is considered a positive change.   Pulmonary Function Assessment:   Exercise Target Goals: Exercise Program Goal: Individual exercise prescription set using results from initial 6 min walk test and THRR while considering  patient's activity barriers and safety.   Exercise  Prescription Goal: Initial exercise prescription builds to 30-45 minutes a day of aerobic activity, 2-3 days per week.  Home exercise guidelines will be given to patient during program as part of exercise prescription that  the participant will acknowledge.  Education: Aerobic Exercise: - Group verbal and visual presentation on the components of exercise prescription. Introduces F.I.T.T principle from ACSM for exercise prescriptions.  Reviews F.I.T.T. principles of aerobic exercise including progression. Written material given at graduation.   Education: Resistance Exercise: - Group verbal and visual presentation on the components of exercise prescription. Introduces F.I.T.T principle from ACSM for exercise prescriptions  Reviews F.I.T.T. principles of resistance exercise including progression. Written material given at graduation.    Education: Exercise & Equipment Safety: - Individual verbal instruction and demonstration of equipment use and safety with use of the equipment. Flowsheet Row Pulmonary Rehab from 08/17/2021 in [facility] Cardiac and Pulmonary Rehab  Date 08/17/21  Educator [facility]  Instruction Review Code 1- Verbalizes Understanding       Education: Exercise Physiology & General Exercise Guidelines: - Group verbal and written instruction with models to review the exercise physiology of the cardiovascular system and associated critical values. Provides general exercise guidelines with specific guidelines to those with heart or lung disease.  Flowsheet Row Pulmonary Rehab from 08/17/2021 in [facility] Cardiac and Pulmonary Rehab  Education need identified 08/17/21       Education: Flexibility, Balance, Mind/Body Relaxation: - Group verbal and visual presentation with interactive activity on the components of exercise prescription. Introduces F.I.T.T principle from ACSM for exercise prescriptions. Reviews F.I.T.T. principles of flexibility and balance exercise training including progression.  Also discusses the mind body connection.  Reviews various relaxation techniques to help reduce and manage stress (i.e. Deep breathing, progressive muscle relaxation, and visualization). Balance handout provided to take home. Written material given at graduation.   Activity Barriers & Risk Stratification:  Activity Barriers & Cardiac Risk Stratification - 07/31/21 1313       Activity Barriers & Cardiac Risk Stratification   Activity Barriers Joint Problems;Arthritis;Shortness of Breath;Balance Concerns    Comments Walker             6 Minute Walk:  6 Minute Walk     Row Name 08/17/21 1551         6 Minute Walk   Phase Initial     Distance 680 feet     Walk Time 6 minutes     # of Rest Breaks 0     MPH 1.29     METS 1.25     RPE 13     Perceived Dyspnea  2     VO2 Peak 4.4     Symptoms No     Resting HR 72 bpm     Resting BP 140/72     Resting Oxygen Saturation  95 %     Exercise Oxygen Saturation  during 6 min walk 93 %     Max Ex. HR 84 bpm     Max Ex. BP 162/80     2 Minute Post BP 138/80       Interval HR   1 Minute HR 85     2 Minute HR 84     3 Minute HR 84     4 Minute HR 80     5 Minute HR 70     6 Minute HR 83     2 Minute Post HR 74     Interval Heart Rate? Yes       Interval Oxygen   Interval Oxygen? Yes     Baseline Oxygen Saturation % 95 %     1 Minute Oxygen Saturation % 95 %  1 Minute Liters of Oxygen 0 L     2 Minute Oxygen Saturation % 93 %     2 Minute Liters of Oxygen 0 L     3 Minute Oxygen Saturation % 95 %     3 Minute Liters of Oxygen 0 L     4 Minute Oxygen Saturation % 94 %     4 Minute Liters of Oxygen 0 L     5 Minute Oxygen Saturation % 96 %     5 Minute Liters of Oxygen 0 L     6 Minute Oxygen Saturation % 95 %     6 Minute Liters of Oxygen 0 L     2 Minute Post Oxygen Saturation % 95 %     2 Minute Post Liters of Oxygen 0 L             Oxygen Initial Assessment:  Oxygen Initial Assessment - 08/17/21 1609        Home Oxygen   Home Oxygen Device None    Sleep Oxygen Prescription Continuous    Liters per minute 2    Home Exercise Oxygen Prescription None    Home Resting Oxygen Prescription None    Compliance with Home Oxygen Use Yes      Intervention   Short Term Goals To learn and exhibit compliance with exercise, home and travel O2 prescription;To learn and understand importance of monitoring SPO2 with pulse oximeter and demonstrate accurate use of the pulse oximeter.;To learn and understand importance of maintaining oxygen saturations>88%;To learn and demonstrate proper pursed lip breathing techniques or other breathing techniques.     Long  Term Goals Verbalizes importance of monitoring SPO2 with pulse oximeter and return demonstration;Maintenance of O2 saturations>88%;Exhibits proper breathing techniques, such as pursed lip breathing or other method taught during program session;Compliance with respiratory medication             Oxygen Re-Evaluation:  Oxygen Re-Evaluation     Row Name 09/01/21 0916             Home Oxygen   Home Oxygen Device None       Sleep Oxygen Prescription Continuous       Liters per minute 2       Home Exercise Oxygen Prescription None       Home Resting Oxygen Prescription None       Compliance with Home Oxygen Use Yes         Goals/Expected Outcomes   Short Term Goals To learn and exhibit compliance with exercise, home and travel O2 prescription;To learn and understand importance of monitoring SPO2 with pulse oximeter and demonstrate accurate use of the pulse oximeter.;To learn and understand importance of maintaining oxygen saturations>88%;To learn and demonstrate proper pursed lip breathing techniques or other breathing techniques.        Long  Term Goals Verbalizes importance of monitoring SPO2 with pulse oximeter and return demonstration;Maintenance of O2 saturations>88%;Exhibits proper breathing techniques, such as pursed lip breathing or other method  taught during program session;Compliance with respiratory medication       Comments Reviewed PLB technique with pt.  Talked about how it works and it's importance in maintaining their exercise saturations.       Goals/Expected Outcomes Short: Become more profiecient at using PLB.   Long: Become independent at using PLB.                Oxygen Discharge (Final Oxygen Re-Evaluation):  Oxygen Re-Evaluation - 09/01/21  0916       Home Oxygen   Home Oxygen Device None    Sleep Oxygen Prescription Continuous    Liters per minute 2    Home Exercise Oxygen Prescription None    Home Resting Oxygen Prescription None    Compliance with Home Oxygen Use Yes      Goals/Expected Outcomes   Short Term Goals To learn and exhibit compliance with exercise, home and travel O2 prescription;To learn and understand importance of monitoring SPO2 with pulse oximeter and demonstrate accurate use of the pulse oximeter.;To learn and understand importance of maintaining oxygen saturations>88%;To learn and demonstrate proper pursed lip breathing techniques or other breathing techniques.     Long  Term Goals Verbalizes importance of monitoring SPO2 with pulse oximeter and return demonstration;Maintenance of O2 saturations>88%;Exhibits proper breathing techniques, such as pursed lip breathing or other method taught during program session;Compliance with respiratory medication    Comments Reviewed PLB technique with pt.  Talked about how it works and it's importance in maintaining their exercise saturations.    Goals/Expected Outcomes Short: Become more profiecient at using PLB.   Long: Become independent at using PLB.             Initial Exercise Prescription:  Initial Exercise Prescription - 08/17/21 1500       Date of Initial Exercise RX and Referring Provider   Date 08/17/21    Referring Provider Dr. Patsey Berthold      Oxygen   Maintain Oxygen Saturation 88% or higher      NuStep   Level 1    SPM 80     Minutes 15    METs 1.25      Recumbant Elliptical   Level 1    RPM 50    Minutes 15    METs 1.25      Track   Laps 16    Minutes 15    METs 1.25      Prescription Details   Frequency (times per week) 2    Duration Progress to 30 minutes of continuous aerobic without signs/symptoms of physical distress      Intensity   THRR 40-80% of Max Heartrate 100-129    Ratings of Perceived Exertion 11-13    Perceived Dyspnea 0-4      Progression   Progression Continue to progress workloads to maintain intensity without signs/symptoms of physical distress.      Resistance Training   Training Prescription Yes    Weight 2    Reps 10-15             Perform Capillary Blood Glucose checks as needed.  Exercise Prescription Changes:   Exercise Prescription Changes     Row Name 08/17/21 1500             Response to Exercise   Blood Pressure (Admit) 140/72       Blood Pressure (Exercise) 162/80       Blood Pressure (Exit) 138/80       Heart Rate (Admit) 72 bpm       Heart Rate (Exercise) 84 bpm       Heart Rate (Exit) 74 bpm       Oxygen Saturation (Admit) 95 %       Oxygen Saturation (Exercise) 93 %       Oxygen Saturation (Exit) 95 %       Rating of Perceived Exertion (Exercise) 13       Perceived Dyspnea (Exercise) 2  Symptoms none       Comments 6 MWT results                Exercise Comments:   Exercise Comments     Row Name 09/01/21 0915           Exercise Comments First full day of exercise!  Patient was oriented to gym and equipment including functions, settings, policies, and procedures.  Patient's individual exercise prescription and treatment plan were reviewed.  All starting workloads were established based on the results of the 6 minute walk test done at initial orientation visit.  The plan for exercise progression was also introduced and progression will be customized based on patient's performance and goals.                Exercise  Goals and Review:   Exercise Goals     Row Name 08/17/21 1559             Exercise Goals   Increase Physical Activity Yes       Intervention Provide advice, education, support and counseling about physical activity/exercise needs.;Develop an individualized exercise prescription for aerobic and resistive training based on initial evaluation findings, risk stratification, comorbidities and participant's personal goals.       Expected Outcomes Short Term: Attend rehab on a regular basis to increase amount of physical activity.;Long Term: Add in home exercise to make exercise part of routine and to increase amount of physical activity.;Long Term: Exercising regularly at least 3-5 days a week.       Increase Strength and Stamina Yes       Intervention Provide advice, education, support and counseling about physical activity/exercise needs.;Develop an individualized exercise prescription for aerobic and resistive training based on initial evaluation findings, risk stratification, comorbidities and participant's personal goals.       Expected Outcomes Short Term: Increase workloads from initial exercise prescription for resistance, speed, and METs.;Short Term: Perform resistance training exercises routinely during rehab and add in resistance training at home;Long Term: Improve cardiorespiratory fitness, muscular endurance and strength as measured by increased METs and functional capacity (6MWT)       Able to understand and use rate of perceived exertion (RPE) scale Yes       Intervention Provide education and explanation on how to use RPE scale       Expected Outcomes Short Term: Able to use RPE daily in rehab to express subjective intensity level;Long Term:  Able to use RPE to guide intensity level when exercising independently       Able to understand and use Dyspnea scale Yes       Intervention Provide education and explanation on how to use Dyspnea scale       Expected Outcomes Short Term: Able to  use Dyspnea scale daily in rehab to express subjective sense of shortness of breath during exertion;Long Term: Able to use Dyspnea scale to guide intensity level when exercising independently       Knowledge and understanding of Target Heart Rate Range (THRR) Yes       Intervention Provide education and explanation of THRR including how the numbers were predicted and where they are located for reference       Expected Outcomes Short Term: Able to state/look up THRR;Long Term: Able to use THRR to govern intensity when exercising independently;Short Term: Able to use daily as guideline for intensity in rehab       Able to check pulse independently Yes  Intervention Provide education and demonstration on how to check pulse in carotid and radial arteries.;Review the importance of being able to check your own pulse for safety during independent exercise       Expected Outcomes Short Term: Able to explain why pulse checking is important during independent exercise;Long Term: Able to check pulse independently and accurately       Understanding of Exercise Prescription Yes       Intervention Provide education, explanation, and written materials on patient's individual exercise prescription       Expected Outcomes Short Term: Able to explain program exercise prescription;Long Term: Able to explain home exercise prescription to exercise independently                Exercise Goals Re-Evaluation :  Exercise Goals Re-Evaluation     Row Name 09/01/21 0915             Exercise Goal Re-Evaluation   Exercise Goals Review Increase Physical Activity;Able to understand and use rate of perceived exertion (RPE) scale;Knowledge and understanding of Target Heart Rate Range (THRR);Understanding of Exercise Prescription;Able to understand and use Dyspnea scale;Able to check pulse independently;Increase Strength and Stamina       Comments Reviewed RPE and dyspnea scales, THR and program prescription with pt  today.  Pt voiced understanding and was given a copy of goals to take home.       Expected Outcomes Short: Use RPE daily to regulate intensity. Long: Follow program prescription in THR.                Discharge Exercise Prescription (Final Exercise Prescription Changes):  Exercise Prescription Changes - 08/17/21 1500       Response to Exercise   Blood Pressure (Admit) 140/72    Blood Pressure (Exercise) 162/80    Blood Pressure (Exit) 138/80    Heart Rate (Admit) 72 bpm    Heart Rate (Exercise) 84 bpm    Heart Rate (Exit) 74 bpm    Oxygen Saturation (Admit) 95 %    Oxygen Saturation (Exercise) 93 %    Oxygen Saturation (Exit) 95 %    Rating of Perceived Exertion (Exercise) 13    Perceived Dyspnea (Exercise) 2    Symptoms none    Comments 6 MWT results             Nutrition:  Target Goals: Understanding of nutrition guidelines, daily intake of sodium 1500mg , cholesterol 200mg , calories 30% from fat and 7% or less from saturated fats, daily to have 5 or more servings of fruits and vegetables.  Education: All About Nutrition: -Group instruction provided by verbal, written material, interactive activities, discussions, models, and posters to present general guidelines for heart healthy nutrition including fat, fiber, MyPlate, the role of sodium in heart healthy nutrition, utilization of the nutrition label, and utilization of this knowledge for meal planning. Follow up email sent as well. Written material given at graduation.   Biometrics:  Pre Biometrics - 08/17/21 1600       Pre Biometrics   Height 5' 9.5" (1.765 m)    Weight 208 lb 4.8 oz (94.5 kg)    BMI (Calculated) 30.33    Single Leg Stand 0 seconds              Nutrition Therapy Plan and Nutrition Goals:  Nutrition Therapy & Goals - 08/17/21 1605       Intervention Plan   Intervention Prescribe, educate and counsel regarding individualized specific dietary modifications aiming towards targeted  core  components such as weight, hypertension, lipid management, diabetes, heart failure and other comorbidities.    Expected Outcomes Short Term Goal: Understand basic principles of dietary content, such as calories, fat, sodium, cholesterol and nutrients.;Short Term Goal: A plan has been developed with personal nutrition goals set during dietitian appointment.;Long Term Goal: Adherence to prescribed nutrition plan.             Nutrition Assessments:  MEDIFICTS Score Key: ?70 Need to make dietary changes  40-70 Heart Healthy Diet ? 40 Therapeutic Level Cholesterol Diet  Flowsheet Row Pulmonary Rehab from 08/17/2021 in [facility] Cardiac and Pulmonary Rehab  Picture Your Plate Total Score on Admission 63      Picture Your Plate Scores: <78 Unhealthy dietary pattern with much room for improvement. 41-50 Dietary pattern unlikely to meet recommendations for good health and room for improvement. 51-60 More healthful dietary pattern, with some room for improvement.  >60 Healthy dietary pattern, although there may be some specific behaviors that could be improved.   Nutrition Goals Re-Evaluation:   Nutrition Goals Discharge (Final Nutrition Goals Re-Evaluation):   Psychosocial: Target Goals: Acknowledge presence or absence of significant depression and/or stress, maximize coping skills, provide positive support system. Participant is able to verbalize types and ability to use techniques and skills needed for reducing stress and depression.   Education: Stress, Anxiety, and Depression - Group verbal and visual presentation to define topics covered.  Reviews how body is impacted by stress, anxiety, and depression.  Also discusses healthy ways to reduce stress and to treat/manage anxiety and depression.  Written material given at graduation.   Education: Sleep Hygiene -Provides group verbal and written instruction about how sleep can affect your health.  Define sleep hygiene, discuss sleep cycles  and impact of sleep habits. Review good sleep hygiene tips.    Initial Review & Psychosocial Screening:  Initial Psych Review & Screening - 07/31/21 1328       Initial Review   Current issues with Current Stress Concerns    Source of Stress Concerns Family;Transportation      Family Dynamics   Good Support System? Yes   friend   Concerns Inappropriate over/under dependence on family/friends      Barriers   Psychosocial barriers to participate in program There are no identifiable barriers or psychosocial needs.;The patient should benefit from training in stress management and relaxation.      Screening Interventions   Interventions Encouraged to exercise;Provide feedback about the scores to participant;To provide support and resources with identified psychosocial needs    Expected Outcomes Short Term goal: Utilizing psychosocial counselor, staff and physician to assist with identification of specific Stressors or current issues interfering with healing process. Setting desired goal for each stressor or current issue identified.;Long Term Goal: Stressors or current issues are controlled or eliminated.;Short Term goal: Identification and review with participant of any Quality of Life or Depression concerns found by scoring the questionnaire.;Long Term goal: The participant improves quality of Life and PHQ9 Scores as seen by post scores and/or verbalization of changes             Quality of Life Scores:  Scores of 19 and below usually indicate a poorer quality of life in these areas.  A difference of  2-3 points is a clinically meaningful difference.  A difference of 2-3 points in the total score of the Quality of Life Index has been associated with significant improvement in overall quality of life, self-image, physical symptoms, and general  health in studies assessing change in quality of life.  PHQ-9: Recent Review Flowsheet Data     Depression screen [facility] 2/9 08/17/2021 05/18/2021  12/09/2020 03/31/2020 12/04/2019   Decreased Interest 0 3 0 0 0   Down, Depressed, Hopeless 3 0 0 0 1   PHQ - 2 Score 3 3 0 0 1   Altered sleeping 3 3 - - -   Tired, decreased energy 2 2 - - -   Change in appetite 3 3 - - -   Feeling bad or failure about yourself  3 0 - - -   Trouble concentrating 0 0 - - -   Moving slowly or fidgety/restless 0 0 - - -   Suicidal thoughts 0 3 - - -   PHQ-9 Score 14 14 - - -   Difficult doing work/chores Somewhat difficult Not difficult at all - - -      Interpretation of Total Score  Total Score Depression Severity:  1-4 = Minimal depression, 5-9 = Mild depression, 10-14 = Moderate depression, 15-19 = Moderately severe depression, 20-27 = Severe depression   Psychosocial Evaluation and Intervention:  Psychosocial Evaluation - 07/31/21 1338       Psychosocial Evaluation & Interventions   Interventions Encouraged to exercise with the program and follow exercise prescription;Stress management education    Comments Nathaniel Little is coming to pulmonary rehab for COPD. He has been placed on O2 at night and sometimes will put himself on it during the day if he is feeling sluggish. He does report some stress concerns related to transportation and family. His friend who used to drive him places is also dealing with health issues so Leonardo has been using [employer] more. Regarding family, he states he has been having flashback dreams about his wife who died 8 years ago. This disrupts his sleep which is already struggling since he only gets about 3-4 hours of sleep a night (has been his norm since high school). His other family stressor is his daughter who he states he doesn't want to claim her as such after the way she has treated him, including selling some of his checks which he turned her in to the government for. She is local, but he hasnt seen her in 4 years. He lives in [place] where he enjoys the dance and yoga classes they offer. Sometimes he gets annoyed with the nosey  neighbors, but he finds peace in his costume jewelry making hobby. He is hopeful this program will boost his stamina and improve his breathing.    Expected Outcomes Short; attend pulmonary rehab for education and exercise. Long: develop and maintain positive self care habits    Continue Psychosocial Services  Follow up required by staff             Psychosocial Re-Evaluation:   Psychosocial Discharge (Final Psychosocial Re-Evaluation):   Education: Education Goals: Education classes will be provided on a weekly basis, covering required topics. Participant will state understanding/return demonstration of topics presented.  Learning Barriers/Preferences:  Learning Barriers/Preferences - 07/31/21 1327       Learning Barriers/Preferences   Learning Barriers None    Learning Preferences Individual Instruction             General Pulmonary Education Topics:  Infection Prevention: - Provides verbal and written material to individual with discussion of infection control including proper hand washing and proper equipment cleaning during exercise session. Flowsheet Row Pulmonary Rehab from 08/17/2021 in [facility] Cardiac and Pulmonary Rehab  Date 08/17/21  Educator [facility]  Instruction Review Code 1- Verbalizes Understanding       Falls Prevention: - Provides verbal and written material to individual with discussion of falls prevention and safety. Flowsheet Row Pulmonary Rehab from 08/17/2021 in [facility] Cardiac and Pulmonary Rehab  Date 08/17/21  Educator [facility]  Instruction Review Code 1- Verbalizes Understanding       Chronic Lung Disease Review: - Group verbal instruction with posters, models, PowerPoint presentations and videos,  to review new updates, new respiratory medications, new advancements in procedures and treatments. Providing information on websites and "800" numbers for continued self-education. Includes information about supplement oxygen, available portable oxygen  systems, continuous and intermittent flow rates, oxygen safety, concentrators, and Medicare reimbursement for oxygen. Explanation of Pulmonary Drugs, including class, frequency, complications, importance of spacers, rinsing mouth after steroid MDI's, and proper cleaning methods for nebulizers. Review of basic lung anatomy and physiology related to function, structure, and complications of lung disease. Review of risk factors. Discussion about methods for diagnosing sleep apnea and types of masks and machines for OSA. Includes a review of the use of types of environmental controls: home humidity, furnaces, filters, dust mite/pet prevention, HEPA vacuums. Discussion about weather changes, air quality and the benefits of nasal washing. Instruction on Warning signs, infection symptoms, calling MD promptly, preventive modes, and value of vaccinations. Review of effective airway clearance, coughing and/or vibration techniques. Emphasizing that all should Create an Action Plan. Written material given at graduation. Flowsheet Row Pulmonary Rehab from 08/17/2021 in [facility] Cardiac and Pulmonary Rehab  Education need identified 08/17/21       AED/CPR: - Group verbal and written instruction with the use of models to demonstrate the basic use of the AED with the basic ABC's of resuscitation.    Anatomy and Cardiac Procedures: - Group verbal and visual presentation and models provide information about basic cardiac anatomy and function. Reviews the testing methods done to diagnose heart disease and the outcomes of the test results. Describes the treatment choices: Medical Management, Angioplasty, or Coronary Bypass Surgery for treating various heart conditions including Myocardial Infarction, Angina, Valve Disease, and Cardiac Arrhythmias.  Written material given at graduation.   Medication Safety: - Group verbal and visual instruction to review commonly prescribed medications for heart and lung disease. Reviews  the medication, class of the drug, and side effects. Includes the steps to properly store meds and maintain the prescription regimen.  Written material given at graduation.   Other: -Provides group and verbal instruction on various topics (see comments)   Knowledge Questionnaire Score:  Knowledge Questionnaire Score - 08/17/21 1606       Knowledge Questionnaire Score   Pre Score 9/18              Core Components/Risk Factors/Patient Goals at Admission:  Personal Goals and Risk Factors at Admission - 08/17/21 1602       Core Components/Risk Factors/Patient Goals on Admission    Weight Management Yes    Intervention Weight Management: Develop a combined nutrition and exercise program designed to reach desired caloric intake, while maintaining appropriate intake of nutrient and fiber, sodium and fats, and appropriate energy expenditure required for the weight goal.;Weight Management: Provide education and appropriate resources to help participant work on and attain dietary goals.;Weight Management/Obesity: Establish reasonable short term and long term weight goals.;Obesity: Provide education and appropriate resources to help participant work on and attain dietary goals.    Admit Weight 208 lb 4.8 oz (94.5 kg)  Goal Weight: Short Term 203 lb (92.1 kg)    Goal Weight: Long Term 198 lb (89.8 kg)    Expected Outcomes Short Term: Continue to assess and modify interventions until short term weight is achieved;Long Term: Adherence to nutrition and physical activity/exercise program aimed toward attainment of established weight goal;Understanding recommendations for meals to include 15-35% energy as protein, 25-35% energy from fat, 35-60% energy from carbohydrates, less than 200mg  of dietary cholesterol, 20-35 gm of total fiber daily;Understanding of distribution of calorie intake throughout the day with the consumption of 4-5 meals/snacks;Weight Loss: Understanding of general recommendations  for a balanced deficit meal plan, which promotes 1-2 lb weight loss per week and includes a negative energy balance of [phone] kcal/d    Tobacco Cessation Yes    Number of packs per day 2-3 cigs a day    Intervention Assist the participant in steps to quit. Provide individualized education and counseling about committing to Tobacco Cessation, relapse prevention, and pharmacological support that can be provided by physician.;Advice worker, assist with locating and accessing local/national Quit Smoking programs, and support quit date choice.    Expected Outcomes Short Term: Will demonstrate readiness to quit, by selecting a quit date.;Short Term: Will quit all tobacco product use, adhering to prevention of relapse plan.;Long Term: Complete abstinence from all tobacco products for at least 12 months from quit date.    Improve shortness of breath with ADL's Yes    Intervention Provide education, individualized exercise plan and daily activity instruction to help decrease symptoms of SOB with activities of daily living.    Expected Outcomes Short Term: Improve cardiorespiratory fitness to achieve a reduction of symptoms when performing ADLs;Long Term: Be able to perform more ADLs without symptoms or delay the onset of symptoms    Diabetes Yes    Intervention Provide education about signs/symptoms and action to take for hypo/hyperglycemia.;Provide education about proper nutrition, including hydration, and aerobic/resistive exercise prescription along with prescribed medications to achieve blood glucose in normal ranges: Fasting glucose 65-99 mg/dL    Expected Outcomes Short Term: Participant verbalizes understanding of the signs/symptoms and immediate care of hyper/hypoglycemia, proper foot care and importance of medication, aerobic/resistive exercise and nutrition plan for blood glucose control.;Long Term: Attainment of HbA1C < 7%.    Hypertension Yes    Intervention Provide education on  lifestyle modifcations including regular physical activity/exercise, weight management, moderate sodium restriction and increased consumption of fresh fruit, vegetables, and low fat dairy, alcohol moderation, and smoking cessation.;Monitor prescription use compliance.    Expected Outcomes Short Term: Continued assessment and intervention until BP is < 140/43mm HG in hypertensive participants. < 130/38mm HG in hypertensive participants with diabetes, heart failure or chronic kidney disease.;Long Term: Maintenance of blood pressure at goal levels.             Education:Diabetes - Individual verbal and written instruction to review signs/symptoms of diabetes, desired ranges of glucose level fasting, after meals and with exercise. Acknowledge that pre and post exercise glucose checks will be done for 3 sessions at entry of program. Flowsheet Row Pulmonary Rehab from 07/31/2021 in [facility] Cardiac and Pulmonary Rehab  Date 07/31/21  Educator [facility]  Instruction Review Code 1- Verbalizes Understanding       Know Your Numbers and Heart Failure: - Group verbal and visual instruction to discuss disease risk factors for cardiac and pulmonary disease and treatment options.  Reviews associated critical values for Overweight/Obesity, Hypertension, Cholesterol, and Diabetes.  Discusses basics of heart failure: signs/symptoms  and treatments.  Introduces Heart Failure Zone chart for action plan for heart failure.  Written material given at graduation.   Core Components/Risk Factors/Patient Goals Review:    Core Components/Risk Factors/Patient Goals at Discharge (Final Review):    ITP Comments:  ITP Comments     Row Name 07/31/21 1323 08/17/21 1548 09/01/21 0915 09/02/21 6004     ITP Comments Initial telephone orientation completed. Diagnosis can be found in [facility] 9/23. EP orientation scheduled for Monday 11/14 at 1:30 Completed 6MWT and gym orientation. Initial ITP created and sent for review to Dr. Zetta Bills, Medical Director. First full day of exercise!  Patient was oriented to gym and equipment including functions, settings, policies, and procedures.  Patient's individual exercise prescription and treatment plan were reviewed.  All starting workloads were established based on the results of the 6 minute walk test done at initial orientation visit.  The plan for exercise progression was also introduced and progression will be customized based on patient's performance and goals. 30 Day review completed. Medical Director ITP review done, changes made as directed, and signed approval by Medical Director.   New to program             Comments:

## 2021-09-03 ENCOUNTER — Encounter: Payer: Medicare Other | Attending: Pulmonary Disease

## 2021-09-03 ENCOUNTER — Other Ambulatory Visit: Payer: Self-pay

## 2021-09-03 DIAGNOSIS — J449 Chronic obstructive pulmonary disease, unspecified: Secondary | ICD-10-CM | POA: Diagnosis not present

## 2021-09-03 LAB — GLUCOSE, CAPILLARY
Glucose-Capillary: 172 mg/dL — ABNORMAL HIGH (ref 70–99)
Glucose-Capillary: 151 mg/dL — ABNORMAL HIGH (ref 70–99)

## 2021-09-03 NOTE — Progress Notes (Signed)
Daily Session Note  Patient Details  Name: Nathaniel Little MRN: [id number] Date of Birth: January 07, 1946 Referring Provider:   Flowsheet Row Pulmonary Rehab from 08/17/2021 in [facility] Cardiac and Pulmonary Rehab  Referring Provider Dr. Patsey Berthold       Encounter Date: 09/03/2021  Check In:  Session Check In - 09/03/21 0926       Check-In   Supervising physician immediately available to respond to emergencies See telemetry face sheet for immediately available ER MD    Location [facility]-Cardiac & Pulmonary Rehab    Staff Present Birdie Sons, MPA, Elveria Rising, BA, ACSM CEP, Exercise Physiologist;Lyndell Gillyard Amedeo Plenty, BS, ACSM CEP, Exercise Physiologist    Virtual Visit No    Medication changes reported     No    Fall or balance concerns reported    No    Tobacco Cessation No Change    Warm-up and Cool-down Performed on first and last piece of equipment    Resistance Training Performed Yes    VAD Patient? No    PAD/SET Patient? No      Pain Assessment   Currently in Pain? No/denies                Social History   Tobacco Use  Smoking Status Every Day   Packs/day: 4.00   Years: 68.00   Pack years: 272.00   Types: Cigarettes  Smokeless Tobacco Former   Types: Chew, Snuff   Quit date: 2000  Tobacco Comments   2-3 cigs a day     Goals Met:  Independence with exercise equipment Exercise tolerated well No report of concerns or symptoms today Strength training completed today  Goals Unmet:  Not Applicable  Comments: Pt able to follow exercise prescription today without complaint.  Will continue to monitor for progression.    Dr. Emily Filbert is Medical Director for [place].  Dr. Ottie Glazier is Medical Director for [facility] Pulmonary Rehabilitation.

## 2021-09-10 ENCOUNTER — Other Ambulatory Visit: Payer: Self-pay

## 2021-09-10 DIAGNOSIS — J449 Chronic obstructive pulmonary disease, unspecified: Secondary | ICD-10-CM | POA: Diagnosis not present

## 2021-09-10 NOTE — Progress Notes (Signed)
Daily Session Note  Patient Details  Name: Nathaniel Little MRN: [id number] Date of Birth: 02/12/1946 Referring Provider:   Flowsheet Row Pulmonary Rehab from 08/17/2021 in [facility] Cardiac and Pulmonary Rehab  Referring Provider Dr. Patsey Berthold       Encounter Date: 09/10/2021  Check In:  Session Check In - 09/10/21 0913       Check-In   Supervising physician immediately available to respond to emergencies See telemetry face sheet for immediately available ER MD    Location [facility]-Cardiac & Pulmonary Rehab    Staff Present Birdie Sons, MPA, RN;Amanda Oletta Darter, BA, ACSM CEP, Exercise Physiologist;Sherwin Hollingshed Amedeo Plenty, BS, ACSM CEP, Exercise Physiologist    Virtual Visit No    Medication changes reported     No    Fall or balance concerns reported    No    Tobacco Cessation Use Decreased    Current number of cigarettes/nicotine per day     0   quit smoking   Warm-up and Cool-down Performed on first and last piece of equipment    Resistance Training Performed Yes    VAD Patient? No    PAD/SET Patient? No      Pain Assessment   Currently in Pain? No/denies                Social History   Tobacco Use  Smoking Status Every Day   Packs/day: 4.00   Years: 68.00   Pack years: 272.00   Types: Cigarettes  Smokeless Tobacco Former   Types: Chew, Snuff   Quit date: 2000  Tobacco Comments   2-3 cigs a day     Goals Met:  Independence with exercise equipment Exercise tolerated well No report of concerns or symptoms today Strength training completed today  Goals Unmet:  Not Applicable  Comments: Pt able to follow exercise prescription today without complaint.  Will continue to monitor for progression.    Dr. Emily Filbert is Medical Director for [place].  Dr. Ottie Glazier is Medical Director for [facility] Pulmonary Rehabilitation.

## 2021-09-14 ENCOUNTER — Telehealth: Payer: Self-pay

## 2021-09-14 NOTE — Telephone Encounter (Signed)
called pt to reschedule, vm is full & couldn't leave message/ sent letter in the mail

## 2021-09-15 ENCOUNTER — Other Ambulatory Visit: Payer: Self-pay

## 2021-09-15 DIAGNOSIS — J449 Chronic obstructive pulmonary disease, unspecified: Secondary | ICD-10-CM | POA: Diagnosis not present

## 2021-09-15 NOTE — Progress Notes (Signed)
Completed initial RD evaluation 

## 2021-09-15 NOTE — Progress Notes (Signed)
Daily Session Note  Patient Details  Name: Nathaniel Little MRN: [id number] Date of Birth: 1946-09-10 Referring Provider:   Flowsheet Row Pulmonary Rehab from 08/17/2021 in [facility] Cardiac and Pulmonary Rehab  Referring Provider Dr. Patsey Berthold       Encounter Date: 09/15/2021  Check In:  Session Check In - 09/15/21 0915       Check-In   Supervising physician immediately available to respond to emergencies See telemetry face sheet for immediately available ER MD    Location [facility]-Cardiac & Pulmonary Rehab    Staff Present Birdie Sons, MPA, RN;Amanda Oletta Darter, BA, ACSM CEP, Exercise Physiologist;Jessica Huntington, MA, RCEP, CCRP, CCET    Virtual Visit No    Medication changes reported     No    Fall or balance concerns reported    No    Tobacco Cessation No Change    Warm-up and Cool-down Performed on first and last piece of equipment    Resistance Training Performed Yes    VAD Patient? No    PAD/SET Patient? No      Pain Assessment   Currently in Pain? No/denies                Social History   Tobacco Use  Smoking Status Every Day   Packs/day: 4.00   Years: 68.00   Pack years: 272.00   Types: Cigarettes  Smokeless Tobacco Former   Types: Chew, Snuff   Quit date: 2000  Tobacco Comments   2-3 cigs a day     Goals Met:  Independence with exercise equipment Exercise tolerated well No report of concerns or symptoms today Strength training completed today  Goals Unmet:  Not Applicable  Comments: Pt able to follow exercise prescription today without complaint.  Will continue to monitor for progression.    Dr. Emily Filbert is Medical Director for [place].  Dr. Ottie Glazier is Medical Director for [facility] Pulmonary Rehabilitation.

## 2021-09-17 ENCOUNTER — Other Ambulatory Visit: Payer: Self-pay

## 2021-09-17 DIAGNOSIS — J449 Chronic obstructive pulmonary disease, unspecified: Secondary | ICD-10-CM

## 2021-09-17 NOTE — Progress Notes (Signed)
Daily Session Note  Patient Details  Name: Nathaniel Little MRN: [id number] Date of Birth: [birth year] Referring Provider:   Flowsheet Row Pulmonary Rehab from 08/17/2021 in [facility] Cardiac and Pulmonary Rehab  Referring Provider Dr. Patsey Berthold       Encounter Date: 09/17/2021  Check In:  Session Check In - 09/17/21 0915       Check-In   Supervising physician immediately available to respond to emergencies See telemetry face sheet for immediately available ER MD    Location [facility]-Cardiac & Pulmonary Rehab    Staff Present Birdie Sons, MPA, RN;Amanda Oletta Darter, BA, ACSM CEP, Exercise Physiologist;Ike Maragh Amedeo Plenty, BS, ACSM CEP, Exercise Physiologist    Virtual Visit No    Medication changes reported     No    Fall or balance concerns reported    No    Tobacco Cessation No Change    Warm-up and Cool-down Performed on first and last piece of equipment    Resistance Training Performed Yes    VAD Patient? No    PAD/SET Patient? No      Pain Assessment   Currently in Pain? No/denies                Social History   Tobacco Use  Smoking Status Every Day   Packs/day: 4.00   Years: 68.00   Pack years: 272.00   Types: Cigarettes  Smokeless Tobacco Former   Types: Chew, Snuff   Quit date: 2000  Tobacco Comments   2-3 cigs a day     Goals Met:  Independence with exercise equipment Exercise tolerated well No report of concerns or symptoms today Strength training completed today  Goals Unmet:  Not Applicable  Comments: Pt able to follow exercise prescription today without complaint.  Will continue to monitor for progression.    Dr. Emily Filbert is Medical Director for [place].  Dr. Ottie Glazier is Medical Director for [facility] Pulmonary Rehabilitation.

## 2021-09-21 ENCOUNTER — Telehealth: Payer: Medicare Other

## 2021-09-22 ENCOUNTER — Other Ambulatory Visit: Payer: Self-pay | Admitting: Internal Medicine

## 2021-09-22 ENCOUNTER — Other Ambulatory Visit: Payer: Self-pay

## 2021-09-22 DIAGNOSIS — I5030 Unspecified diastolic (congestive) heart failure: Secondary | ICD-10-CM | POA: Diagnosis not present

## 2021-09-22 DIAGNOSIS — J449 Chronic obstructive pulmonary disease, unspecified: Secondary | ICD-10-CM | POA: Diagnosis not present

## 2021-09-22 NOTE — Progress Notes (Signed)
Daily Session Note ° °Patient Details  °Name: Nathaniel Little °MRN: [id number] °Date of Birth: 04/03/1946 °Referring Provider:   °Flowsheet Row Pulmonary Rehab from 08/17/2021 in [facility] Cardiac and Pulmonary Rehab  °Referring Provider Dr. Gonzalez  ° °  ° ° °Encounter Date: 09/22/2021 ° °Check In: ° Session Check In - 09/22/21 0917   ° °  ° Check-In  ° Supervising physician immediately available to respond to emergencies See telemetry face sheet for immediately available ER MD   ° Location [facility]-Cardiac & Pulmonary Rehab   ° Staff Present Kelly Bollinger, MPA, RN;Amanda Sommer, BA, ACSM CEP, Exercise Physiologist;Jessica Hawkins, MA, RCEP, CCRP, CCET   ° Virtual Visit No   ° Medication changes reported     No   ° Fall or balance concerns reported    No   ° Tobacco Cessation No Change   ° Warm-up and Cool-down Performed on first and last piece of equipment   ° Resistance Training Performed Yes   ° VAD Patient? No   ° PAD/SET Patient? No   °  ° Pain Assessment  ° Currently in Pain? No/denies   ° °  °  ° °  ° ° ° ° ° °Social History  ° °Tobacco Use  °Smoking Status Every Day  ° Packs/day: 4.00  ° Years: 68.00  ° Pack years: 272.00  ° Types: Cigarettes  °Smokeless Tobacco Former  ° Types: Chew, Snuff  ° Quit date: 2000  °Tobacco Comments  ° 2-3 cigs a day   ° ° °Goals Met:  °Independence with exercise equipment °Exercise tolerated well °No report of concerns or symptoms today °Strength training completed today ° °Goals Unmet:  °Not Applicable ° °Comments: Pt able to follow exercise prescription today without complaint.  Will continue to monitor for progression. ° ° ° °Dr. Mark Miller is Medical Director for [facility].  °Dr. Fuad Aleskerov is Medical Director for [facility] Pulmonary Rehabilitation. °

## 2021-09-22 NOTE — Telephone Encounter (Signed)
Requested medications are due for refill today.  yes  Requested medications are on the active medications list.  yes  Last refill. 06/04/2021  Future visit scheduled.   yes  Notes to clinic.  Historical medication. No protocol assigned.    Requested Prescriptions  Pending Prescriptions Disp Refills   BREZTRI AEROSPHERE 160-9-4.8 MCG/ACT AERO [Pharmacy Med Name: BREZTRI AEROSPHERE 160-9-4.8 MCG/AC] 10.7 g     Sig: INHALE 2 PUFFS INTO THE LUNGS TWICE DAILY     Off-Protocol Failed - 09/22/2021 11:46 AM      Failed - Medication not assigned to a protocol, review manually.      Passed - Valid encounter within last 12 months    Recent Outpatient Visits           1 month ago Blood in [place], NP   3 months ago Abdominal bloating   [facility] [place], [place], NP   4 months ago DM type 2 with diabetic peripheral neuropathy [facility])   [facility], Coralie Keens, NP   8 months ago Mixed hyperlipidemia   [place], NP   1 year ago DM type 2 with diabetic peripheral neuropathy [facility])   [facility], [name], FNP       Future Appointments             In 2 months [facility], [facility]

## 2021-09-24 ENCOUNTER — Ambulatory Visit: Payer: Medicare Other | Admitting: Gastroenterology

## 2021-09-24 ENCOUNTER — Other Ambulatory Visit: Payer: Self-pay

## 2021-09-24 DIAGNOSIS — J449 Chronic obstructive pulmonary disease, unspecified: Secondary | ICD-10-CM | POA: Diagnosis not present

## 2021-09-24 DIAGNOSIS — I5030 Unspecified diastolic (congestive) heart failure: Secondary | ICD-10-CM | POA: Diagnosis not present

## 2021-09-24 NOTE — Progress Notes (Signed)
Daily Session Note  Patient Details  Name: Nathaniel Little MRN: [id number] Date of Birth: Apr 12, 1946 Referring Provider:   Flowsheet Row Pulmonary Rehab from 08/17/2021 in [facility]  Referring Provider Dr. Patsey Berthold       Encounter Date: 09/24/2021  Check In:  Session Check In - 09/24/21 0913       Check-In   Supervising physician immediately available to respond to emergencies See telemetry face sheet for immediately available ER MD    Location [facility]-Cardiac & Pul[facility]    Staff Present Birdie Sons, MPA, Elveria Rising, BA, ACSM CEP, Exercise Physiologist;Magdala Brahmbhatt Amedeo Plenty, BS, ACSM CEP, Exercise Physiologist    Virtual Visit No    Medication changes reported     No    Fall or balance concerns reported    No    Tobacco Cessation No Change    Warm-up and Cool-down Performed on first and last piece of equipment    Resistance Training Performed Yes    VAD Patient? No    PAD/SET Patient? No      Pain Assessment   Currently in Pain? No/denies                Social History   Tobacco Use  Smoking Status Every Day   Packs/day: 4.00   Years: 68.00   Pack years: 272.00   Types: Cigarettes  Smokeless Tobacco Former   Types: Chew, Snuff   Quit date: 2000  Tobacco Comments   2-3 cigs a day     Goals Met:  Independence with exercise equipment Exercise tolerated well No report of concerns or symptoms today Strength training completed today  Goals Unmet:  Not Applicable  Comments: Pt able to follow exercise prescription today without complaint.  Will continue to monitor for progression.    Dr. Emily Filbert is Medical Director for [place].  Dr. Ottie Glazier is Medical Director for [facility] Pulmonary Rehabilitation.

## 2021-09-30 ENCOUNTER — Encounter: Payer: Self-pay | Admitting: *Deleted

## 2021-09-30 DIAGNOSIS — J449 Chronic obstructive pulmonary disease, unspecified: Secondary | ICD-10-CM

## 2021-09-30 NOTE — Progress Notes (Signed)
Pulmonary Individual Treatment Plan  Patient Details  Name: Nathaniel Little MRN: [id number] Date of Birth: Nov 01, 1945 Referring Provider:   Flowsheet Row Pulmonary Rehab from 08/17/2021 in [facility] Cardiac and Pulmonary Rehab  Referring Provider Dr. Patsey Berthold       Initial Encounter Date:  Flowsheet Row Pulmonary Rehab from 08/17/2021 in [facility] Cardiac and Pulmonary Rehab  Date 08/17/21       Visit Diagnosis: Chronic obstructive pulmonary disease, unspecified COPD type ([place])  Patient's Home Medications on Admission:  Current Outpatient Medications:    glucose blood (ACCU-CHEK GUIDE) test strip, Check blood sugar twice daily, Disp: 100 each, Rfl: 12   albuterol (VENTOLIN HFA) 108 (90 Base) MCG/ACT inhaler, Inhale 2 puffs into the lungs every 6 (six) hours as needed for wheezing or shortness of breath., Disp: , Rfl:    aspirin EC 81 MG tablet, Take 81 mg by mouth daily., Disp: , Rfl:    BREZTRI AEROSPHERE 160-9-4.8 MCG/ACT AERO, INHALE 2 PUFFS INTO THE LUNGS TWICE DAILY, Disp: 10.7 g, Rfl: 0   calcium-vitamin D (OSCAL WITH D) 250-125 MG-UNIT tablet, Take 1 tablet by mouth daily., Disp: , Rfl:    carvedilol (COREG) 6.25 MG tablet, Take 1 tablet (6.25 mg total) by mouth 2 (two) times daily with a meal., Disp: 180 tablet, Rfl: 3   cloNIDine (CATAPRES) 0.1 MG tablet, Take 1 tablet (0.1 mg total) by mouth 2 (two) times daily., Disp: 60 tablet, Rfl: 2   ezetimibe (ZETIA) 10 MG tablet, Take 1 tablet (10 mg total) by mouth daily., Disp: 90 tablet, Rfl: 3   gabapentin (NEURONTIN) 100 MG capsule, Take 1-2 capsules (100-200 mg total) by mouth at bedtime., Disp: 180 capsule, Rfl: 1   insulin glargine (LANTUS) 100 unit/mL SOPN, Inject 20 Units into the skin at bedtime., Disp: , Rfl:    ipratropium-albuterol (DUONEB) 0.5-2.5 (3) MG/3ML SOLN, Take 3 mLs by nebulization every 6 (six) hours as needed (Asthma)., Disp: 360 mL, Rfl: 12   ketoconazole (NIZORAL) 2 % cream, Apply 1 application topically daily.,  Disp: 15 g, Rfl: 0   metFORMIN (GLUCOPHAGE) 500 MG tablet, Take 1,000 mg by mouth daily with breakfast. 2 in the morning 1 at night., Disp: , Rfl:    pantoprazole (PROTONIX) 40 MG tablet, Take 1 tablet (40 mg total) by mouth 2 (two) times daily., Disp: 60 tablet, Rfl: 11   potassium chloride (KLOR-CON) 10 MEQ tablet, Take 3 tablets (30 mEq total) by mouth daily., Disp: 90 tablet, Rfl: 6   torsemide (DEMADEX) 10 MG tablet, Take 1 tablet (10 mg total) by mouth daily., Disp: 30 tablet, Rfl: 6  Past Medical History: Past Medical History:  Diagnosis Date   (HFpEF) heart failure with preserved ejection fraction ([place])    a. 08/2017 Echo: EF 60-65%, Gr1 DD.   Arthritis    COPD (chronic obstructive pulmonary disease) ([place])    Coronary artery disease 2021   Depression    Diabetes mellitus without complication (HCC)    Hypertension    Incidental pulmonary nodule, greater than or equal to 58mm 03/11/2020   Left lower lobe - suspicious for bronchogenic neoplasm   Lymphedema    Morbid obesity ([place])    Prostate cancer ([place]) 03/2013   ? PANCREATIC   S/P TAVR (transcatheter aortic valve replacement) 04/01/2020   s/p TAVR with a 29 mm Edwards Sapien 3 THV via the TF approach by Drs Buena Irish & Roxy Manns   Severe aortic stenosis    Sleep apnea with use of continuous positive airway  pressure (CPAP)    Tobacco abuse    Tremor    HEAD    Tobacco Use: Social History   Tobacco Use  Smoking Status Every Day   Packs/day: 4.00   Years: 68.00   Pack years: 272.00   Types: Cigarettes  Smokeless Tobacco Former   Types: Chew, Snuff   Quit date: 2000  Tobacco Comments   2-3 cigs a day     Labs: Recent Review Flowsheet Data     Labs for ITP Cardiac and Pulmonary Rehab Latest Ref Rng & Units 09/11/2020 11/14/2020 01/06/2021 04/14/2021 05/18/2021   Cholestrol <200 mg/dL - - 125 - 116   LDLCALC mg/dL (calc) - - 66 - 53   HDL > OR = 40 mg/dL - - 36(L) - 45   Trlycerides <150 mg/dL - - 157(H) - 101   Hemoglobin  A1c <5.7 % of total Hgb 6.3(A) 6.9(H) - - 6.6(H)   PHART 7.350 - 7.450 - 7.432 - - -   PCO2ART 32.0 - 48.0 mmHg - 39.4 - - -   HCO3 20.0 - 28.0 mmol/L - 25.8 - 28.5(H) -   TCO2 22 - 32 mmol/L - - - - -   ACIDBASEDEF 0.0 - 2.0 mmol/L - - - - -   O2SAT % - 95.4 - 67.3 -        Pulmonary Assessment Scores:  Pulmonary Assessment Scores     Row Name 08/17/21 1607         ADL UCSD   SOB Score total 79     Rest 4     Walk 5     Stairs 5     Bath 4     Dress 4     Shop 4       CAT Score   CAT Score 26       mMRC Score   mMRC Score 3              UCSD: Self-administered rating of dyspnea associated with activities of daily living (ADLs) 6-point scale (0 = "not at all" to 5 = "maximal or unable to do because of breathlessness")  Scoring Scores range from 0 to 120.  Minimally important difference is 5 units  CAT: CAT can identify the health impairment of COPD patients and is better correlated with disease progression.  CAT has a scoring range of zero to 40. The CAT score is classified into four groups of low (less than 10), medium (10 - 20), high (21-30) and very high (31-40) based on the impact level of disease on health status. A CAT score over 10 suggests significant symptoms.  A worsening CAT score could be explained by an exacerbation, poor medication adherence, poor inhaler technique, or progression of COPD or comorbid conditions.  CAT MCID is 2 points  mMRC: mMRC (Modified Medical Research Council) Dyspnea Scale is used to assess the degree of baseline functional disability in patients of respiratory disease due to dyspnea. No minimal important difference is established. A decrease in score of 1 point or greater is considered a positive change.   Pulmonary Function Assessment:   Exercise Target Goals: Exercise Program Goal: Individual exercise prescription set using results from initial 6 min walk test and THRR while considering  patients activity barriers and  safety.   Exercise Prescription Goal: Initial exercise prescription builds to 30-45 minutes a day of aerobic activity, 2-3 days per week.  Home exercise guidelines will be given to patient during program as  part of exercise prescription that the participant will acknowledge.  Education: Aerobic Exercise: - Group verbal and visual presentation on the components of exercise prescription. Introduces F.I.T.T principle from ACSM for exercise prescriptions.  Reviews F.I.T.T. principles of aerobic exercise including progression. Written material given at graduation.   Education: Resistance Exercise: - Group verbal and visual presentation on the components of exercise prescription. Introduces F.I.T.T principle from ACSM for exercise prescriptions  Reviews F.I.T.T. principles of resistance exercise including progression. Written material given at graduation.    Education: Exercise & Equipment Safety: - Individual verbal instruction and demonstration of equipment use and safety with use of the equipment. Flowsheet Row Pulmonary Rehab from 09/10/2021 in [facility] Cardiac and Pulmonary Rehab  Date 08/17/21  Educator [facility]  Instruction Review Code 1- Verbalizes Understanding       Education: Exercise Physiology & General Exercise Guidelines: - Group verbal and written instruction with models to review the exercise physiology of the cardiovascular system and associated critical values. Provides general exercise guidelines with specific guidelines to those with heart or lung disease.  Flowsheet Row Pulmonary Rehab from 09/10/2021 in [facility] Cardiac and Pulmonary Rehab  Education need identified 08/17/21       Education: Flexibility, Balance, Mind/Body Relaxation: - Group verbal and visual presentation with interactive activity on the components of exercise prescription. Introduces F.I.T.T principle from ACSM for exercise prescriptions. Reviews F.I.T.T. principles of flexibility and balance exercise training  including progression. Also discusses the mind body connection.  Reviews various relaxation techniques to help reduce and manage stress (i.e. Deep breathing, progressive muscle relaxation, and visualization). Balance handout provided to take home. Written material given at graduation.   Activity Barriers & Risk Stratification:  Activity Barriers & Cardiac Risk Stratification - 07/31/21 1313       Activity Barriers & Cardiac Risk Stratification   Activity Barriers Joint Problems;Arthritis;Shortness of Breath;Balance Concerns    Comments Walker             6 Minute Walk:  6 Minute Walk     Row Name 08/17/21 1551         6 Minute Walk   Phase Initial     Distance 680 feet     Walk Time 6 minutes     # of Rest Breaks 0     MPH 1.29     METS 1.25     RPE 13     Perceived Dyspnea  2     VO2 Peak 4.4     Symptoms No     Resting HR 72 bpm     Resting BP 140/72     Resting Oxygen Saturation  95 %     Exercise Oxygen Saturation  during 6 min walk 93 %     Max Ex. HR 84 bpm     Max Ex. BP 162/80     2 Minute Post BP 138/80       Interval HR   1 Minute HR 85     2 Minute HR 84     3 Minute HR 84     4 Minute HR 80     5 Minute HR 70     6 Minute HR 83     2 Minute Post HR 74     Interval Heart Rate? Yes       Interval Oxygen   Interval Oxygen? Yes     Baseline Oxygen Saturation % 95 %     1 Minute Oxygen Saturation % 95 %  1 Minute Liters of Oxygen 0 L     2 Minute Oxygen Saturation % 93 %     2 Minute Liters of Oxygen 0 L     3 Minute Oxygen Saturation % 95 %     3 Minute Liters of Oxygen 0 L     4 Minute Oxygen Saturation % 94 %     4 Minute Liters of Oxygen 0 L     5 Minute Oxygen Saturation % 96 %     5 Minute Liters of Oxygen 0 L     6 Minute Oxygen Saturation % 95 %     6 Minute Liters of Oxygen 0 L     2 Minute Post Oxygen Saturation % 95 %     2 Minute Post Liters of Oxygen 0 L             Oxygen Initial Assessment:  Oxygen Initial  Assessment - 09/15/21 0925       Home Oxygen   Home Oxygen Device None    Sleep Oxygen Prescription Continuous    Home Exercise Oxygen Prescription None    Home Resting Oxygen Prescription None   uses 1 L prn   Compliance with Home Oxygen Use Yes      Intervention   Short Term Goals To learn and exhibit compliance with exercise, home and travel O2 prescription;To learn and understand importance of monitoring SPO2 with pulse oximeter and demonstrate accurate use of the pulse oximeter.;To learn and understand importance of maintaining oxygen saturations>88%;To learn and demonstrate proper pursed lip breathing techniques or other breathing techniques.     Long  Term Goals Verbalizes importance of monitoring SPO2 with pulse oximeter and return demonstration;Maintenance of O2 saturations>88%;Exhibits proper breathing techniques, such as pursed lip breathing or other method taught during program session;Compliance with respiratory medication             Oxygen Re-Evaluation:  Oxygen Re-Evaluation     Row Name 09/01/21 0916 09/15/21 0925           Home Oxygen   Home Oxygen Device None --      Sleep Oxygen Prescription Continuous --      Liters per minute 2 2      Home Exercise Oxygen Prescription None --      Home Resting Oxygen Prescription None --      Compliance with Home Oxygen Use Yes --        Goals/Expected Outcomes   Short Term Goals To learn and exhibit compliance with exercise, home and travel O2 prescription;To learn and understand importance of monitoring SPO2 with pulse oximeter and demonstrate accurate use of the pulse oximeter.;To learn and understand importance of maintaining oxygen saturations>88%;To learn and demonstrate proper pursed lip breathing techniques or other breathing techniques.  --      Long  Term Goals Verbalizes importance of monitoring SPO2 with pulse oximeter and return demonstration;Maintenance of O2 saturations>88%;Exhibits proper breathing  techniques, such as pursed lip breathing or other method taught during program session;Compliance with respiratory medication --      Comments Reviewed PLB technique with pt.  Talked about how it works and it's importance in maintaining their exercise saturations. We reiterated PLB technique and patient is still checking his oxygen at home. He does report that he uses resting oxygen as needed and is aware to stay above that 88%. He is staying compliant using his oxygen at sleep and wants to talk to his doctor to see if  he can increase his liter flow as he think he will feel better doing so.      Goals/Expected Outcomes Short: Become more profiecient at using PLB.   Long: Become independent at using PLB. Short: Stay above 88% for oxygen and stay compliant to doctor's orders Long: Stay independent at PLB               Oxygen Discharge (Final Oxygen Re-Evaluation):  Oxygen Re-Evaluation - 09/15/21 0925       Home Oxygen   Liters per minute 2      Goals/Expected Outcomes   Comments We reiterated PLB technique and patient is still checking his oxygen at home. He does report that he uses resting oxygen as needed and is aware to stay above that 88%. He is staying compliant using his oxygen at sleep and wants to talk to his doctor to see if he can increase his liter flow as he think he will feel better doing so.    Goals/Expected Outcomes Short: Stay above 88% for oxygen and stay compliant to doctor's orders Long: Stay independent at PLB             Initial Exercise Prescription:  Initial Exercise Prescription - 08/17/21 1500       Date of Initial Exercise RX and Referring Provider   Date 08/17/21    Referring Provider Dr. Patsey Berthold      Oxygen   Maintain Oxygen Saturation 88% or higher      NuStep   Level 1    SPM 80    Minutes 15    METs 1.25      Recumbant Elliptical   Level 1    RPM 50    Minutes 15    METs 1.25      Track   Laps 16    Minutes 15    METs 1.25       Prescription Details   Frequency (times per week) 2    Duration Progress to 30 minutes of continuous aerobic without signs/symptoms of physical distress      Intensity   THRR 40-80% of Max Heartrate 100-129    Ratings of Perceived Exertion 11-13    Perceived Dyspnea 0-4      Progression   Progression Continue to progress workloads to maintain intensity without signs/symptoms of physical distress.      Resistance Training   Training Prescription Yes    Weight 2    Reps 10-15             Perform Capillary Blood Glucose checks as needed.  Exercise Prescription Changes:   Exercise Prescription Changes     Row Name 08/17/21 1500 09/15/21 1200           Response to Exercise   Blood Pressure (Admit) 140/72 106/62      Blood Pressure (Exercise) 162/80 140/64      Blood Pressure (Exit) 138/80 126/62      Heart Rate (Admit) 72 bpm 64 bpm      Heart Rate (Exercise) 84 bpm 75 bpm      Heart Rate (Exit) 74 bpm 70 bpm      Oxygen Saturation (Admit) 95 % 95 %      Oxygen Saturation (Exercise) 93 % 93 %      Oxygen Saturation (Exit) 95 % 91 %      Rating of Perceived Exertion (Exercise) 13 14      Perceived Dyspnea (Exercise) 2 1  Symptoms none none      Comments 6 MWT results third day      Duration -- Progress to 30 minutes of  aerobic without signs/symptoms of physical distress      Intensity -- THRR unchanged        Progression   Progression -- Continue to progress workloads to maintain intensity without signs/symptoms of physical distress.      Average METs -- 1.38        Resistance Training   Training Prescription -- Yes      Weight -- 2 lb      Reps -- 10-15               Exercise Comments:   Exercise Comments     Row Name 09/01/21 0915           Exercise Comments First full day of exercise!  Patient was oriented to gym and equipment including functions, settings, policies, and procedures.  Patient's individual exercise prescription and treatment  plan were reviewed.  All starting workloads were established based on the results of the 6 minute walk test done at initial orientation visit.  The plan for exercise progression was also introduced and progression will be customized based on patient's performance and goals.                Exercise Goals and Review:   Exercise Goals     Row Name 08/17/21 1559             Exercise Goals   Increase Physical Activity Yes       Intervention Provide advice, education, support and counseling about physical activity/exercise needs.;Develop an individualized exercise prescription for aerobic and resistive training based on initial evaluation findings, risk stratification, comorbidities and participant's personal goals.       Expected Outcomes Short Term: Attend rehab on a regular basis to increase amount of physical activity.;Long Term: Add in home exercise to make exercise part of routine and to increase amount of physical activity.;Long Term: Exercising regularly at least 3-5 days a week.       Increase Strength and Stamina Yes       Intervention Provide advice, education, support and counseling about physical activity/exercise needs.;Develop an individualized exercise prescription for aerobic and resistive training based on initial evaluation findings, risk stratification, comorbidities and participant's personal goals.       Expected Outcomes Short Term: Increase workloads from initial exercise prescription for resistance, speed, and METs.;Short Term: Perform resistance training exercises routinely during rehab and add in resistance training at home;Long Term: Improve cardiorespiratory fitness, muscular endurance and strength as measured by increased METs and functional capacity (6MWT)       Able to understand and use rate of perceived exertion (RPE) scale Yes       Intervention Provide education and explanation on how to use RPE scale       Expected Outcomes Short Term: Able to use RPE daily  in rehab to express subjective intensity level;Long Term:  Able to use RPE to guide intensity level when exercising independently       Able to understand and use Dyspnea scale Yes       Intervention Provide education and explanation on how to use Dyspnea scale       Expected Outcomes Short Term: Able to use Dyspnea scale daily in rehab to express subjective sense of shortness of breath during exertion;Long Term: Able to use Dyspnea scale to guide intensity level when exercising  independently       Knowledge and understanding of Target Heart Rate Range (THRR) Yes       Intervention Provide education and explanation of THRR including how the numbers were predicted and where they are located for reference       Expected Outcomes Short Term: Able to state/look up THRR;Long Term: Able to use THRR to govern intensity when exercising independently;Short Term: Able to use daily as guideline for intensity in rehab       Able to check pulse independently Yes       Intervention Provide education and demonstration on how to check pulse in carotid and radial arteries.;Review the importance of being able to check your own pulse for safety during independent exercise       Expected Outcomes Short Term: Able to explain why pulse checking is important during independent exercise;Long Term: Able to check pulse independently and accurately       Understanding of Exercise Prescription Yes       Intervention Provide education, explanation, and written materials on patient's individual exercise prescription       Expected Outcomes Short Term: Able to explain program exercise prescription;Long Term: Able to explain home exercise prescription to exercise independently                Exercise Goals Re-Evaluation :  Exercise Goals Re-Evaluation     Row Name 09/01/21 0915 09/15/21 0923           Exercise Goal Re-Evaluation   Exercise Goals Review Increase Physical Activity;Able to understand and use rate of  perceived exertion (RPE) scale;Knowledge and understanding of Target Heart Rate Range (THRR);Understanding of Exercise Prescription;Able to understand and use Dyspnea scale;Able to check pulse independently;Increase Strength and Stamina Increase Strength and Stamina;Increase Physical Activity      Comments Reviewed RPE and dyspnea scales, THR and program prescription with pt today.  Pt voiced understanding and was given a copy of goals to take home. [place] is only on session 5 and is not ready to go over home exercise yet. He is walking a little bit at home and I encouraged him to think about what kind of home exercise he will be comfortable doing. We talked about the [facility] which he will think about.      Expected Outcomes Short: Use RPE daily to regulate intensity. Long: Follow program prescription in THR. Short: Continue walking home and think about home exercise Long: Exercise independently at home at appropriate prescription               Discharge Exercise Prescription (Final Exercise Prescription Changes):  Exercise Prescription Changes - 09/15/21 1200       Response to Exercise   Blood Pressure (Admit) 106/62    Blood Pressure (Exercise) 140/64    Blood Pressure (Exit) 126/62    Heart Rate (Admit) 64 bpm    Heart Rate (Exercise) 75 bpm    Heart Rate (Exit) 70 bpm    Oxygen Saturation (Admit) 95 %    Oxygen Saturation (Exercise) 93 %    Oxygen Saturation (Exit) 91 %    Rating of Perceived Exertion (Exercise) 14    Perceived Dyspnea (Exercise) 1    Symptoms none    Comments third day    Duration Progress to 30 minutes of  aerobic without signs/symptoms of physical distress    Intensity THRR unchanged      Progression   Progression Continue to progress workloads to maintain intensity without signs/symptoms of  physical distress.    Average METs 1.38      Resistance Training   Training Prescription Yes    Weight 2 lb    Reps 10-15             Nutrition:  Target  Goals: Understanding of nutrition guidelines, daily intake of sodium 1500mg , cholesterol 200mg , calories 30% from fat and 7% or less from saturated fats, daily to have 5 or more servings of fruits and vegetables.  Education: All About Nutrition: -Group instruction provided by verbal, written material, interactive activities, discussions, models, and posters to present general guidelines for heart healthy nutrition including fat, fiber, MyPlate, the role of sodium in heart healthy nutrition, utilization of the nutrition label, and utilization of this knowledge for meal planning. Follow up email sent as well. Written material given at graduation.   Biometrics:  Pre Biometrics - 08/17/21 1600       Pre Biometrics   Height 5' 9.5" (1.765 m)    Weight 208 lb 4.8 oz (94.5 kg)    BMI (Calculated) 30.33    Single Leg Stand 0 seconds              Nutrition Therapy Plan and Nutrition Goals:  Nutrition Therapy & Goals - 09/15/21 1019       Nutrition Therapy   Diet Heart healthy, low Na, T2DM MNT, pulmonary MNT.    Protein (specify units) 75g    Fiber 30 grams    Whole Grain Foods 3 servings    Saturated Fats 12 max. grams    Fruits and Vegetables 8 servings/day    Sodium 1.5 grams      Personal Nutrition Goals   Nutrition Goal ST: track indigestion with last time eating, bedtime, and trigger foods LT: prevent indigestion    Comments 75 y.o. M admitted to rehab for COPD presents with HTN, CHF, GERD, T2DM, HLD, iron deficiency anemia. PMH of . Relevant medications include calcium/vit D, coreg, zeita, lantus, metformin, protonix, K+, torsemide. [place] caters [employer] 5 days a week for lunch; spaghetti and meatballs, black eyed peas, peas, sliced peaches and jello. He makes his meals for the weekend - chili, shrimp, ham, chicken, [place] fried rice. Most days he doesn't feel like eating much so he sticks with some fruit and peanut butter. He reports that he has gas  with eating - he feels coke helps with his indigestion. He gets indigestion close to bedtime.  The only vegetable he won't eat is spinach. He reports that lunch is filling and he will have fruit (variety) and some peanut butter crackers. He drinks water mostly. He does not have teeth so he has difficulty eating hard things. He takes his medicine at 830/9am - with a cracker or 2. he takes 20 units of Lantus at night - he checks his BG in am and pm (he reports is 90-200 depending on the day). He tries to eat foods that are sugar free aside from some peanut butter crackers. 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 Reviewed some key MNT for diabetes and pulmonary nutrition. Will review education as he goes through the program.      Intervention Plan   Intervention Prescribe, educate and counsel regarding individualized specific dietary modifications aiming towards targeted core components such as weight, hypertension, lipid management, diabetes, heart failure and other comorbidities.    Expected Outcomes Short Term Goal: Understand basic principles of dietary content, such as  calories, fat, sodium, cholesterol and nutrients.;Short Term Goal: A plan has been developed with personal nutrition goals set during dietitian appointment.;Long Term Goal: Adherence to prescribed nutrition plan.             Nutrition Assessments:  MEDIFICTS Score Key: ?70 Need to make dietary changes  40-70 Heart Healthy Diet ? 40 Therapeutic Level Cholesterol Diet  Flowsheet Row Pulmonary Rehab from 08/17/2021 in [facility] Cardiac and Pulmonary Rehab  Picture Your Plate Total Score on Admission 63      Picture Your Plate Scores: <79 Unhealthy dietary pattern with much room for improvement. 41-50 Dietary pattern unlikely to meet recommendations for good health and room for improvement. 51-60 More healthful dietary pattern, with some room for improvement.  >60 Healthy dietary pattern,  although there may be some specific behaviors that could be improved.   Nutrition Goals Re-Evaluation:   Nutrition Goals Discharge (Final Nutrition Goals Re-Evaluation):   Psychosocial: Target Goals: Acknowledge presence or absence of significant depression and/or stress, maximize coping skills, provide positive support system. Participant is able to verbalize types and ability to use techniques and skills needed for reducing stress and depression.   Education: Stress, Anxiety, and Depression - Group verbal and visual presentation to define topics covered.  Reviews how body is impacted by stress, anxiety, and depression.  Also discusses healthy ways to reduce stress and to treat/manage anxiety and depression.  Written material given at graduation.   Education: Sleep Hygiene -Provides group verbal and written instruction about how sleep can affect your health.  Define sleep hygiene, discuss sleep cycles and impact of sleep habits. Review good sleep hygiene tips.    Initial Review & Psychosocial Screening:  Initial Psych Review & Screening - 07/31/21 1328       Initial Review   Current issues with Current Stress Concerns    Source of Stress Concerns Family;Transportation      Family Dynamics   Good Support System? Yes   friend   Concerns Inappropriate over/under dependence on family/friends      Barriers   Psychosocial barriers to participate in program There are no identifiable barriers or psychosocial needs.;The patient should benefit from training in stress management and relaxation.      Screening Interventions   Interventions Encouraged to exercise;Provide feedback about the scores to participant;To provide support and resources with identified psychosocial needs    Expected Outcomes Short Term goal: Utilizing psychosocial counselor, staff and physician to assist with identification of specific Stressors or current issues interfering with healing process. Setting desired goal  for each stressor or current issue identified.;Long Term Goal: Stressors or current issues are controlled or eliminated.;Short Term goal: Identification and review with participant of any Quality of Life or Depression concerns found by scoring the questionnaire.;Long Term goal: The participant improves quality of Life and PHQ9 Scores as seen by post scores and/or verbalization of changes             Quality of Life Scores:  Scores of 19 and below usually indicate a poorer quality of life in these areas.  A difference of  2-3 points is a clinically meaningful difference.  A difference of 2-3 points in the total score of the Quality of Life Index has been associated with significant improvement in overall quality of life, self-image, physical symptoms, and general health in studies assessing change in quality of life.  PHQ-9: Recent Review Flowsheet Data     Depression screen [facility] 2/9 09/22/2021 08/17/2021 05/18/2021 12/09/2020 03/31/2020  Decreased Interest 0 0 3 0 0   Down, Depressed, Hopeless 0 3 0 0 0   PHQ - 2 Score 0 3 3 0 0   Altered sleeping 2 3 3  - -   Tired, decreased energy 1 2 2  - -   Change in appetite 0 3 3 - -   Feeling bad or failure about yourself  0 3 0 - -   Trouble concentrating 0 0 0 - -   Moving slowly or fidgety/restless 0 0 0 - -   Suicidal thoughts 0 0 3 - -   PHQ-9 Score 3 14 14  - -   Difficult doing work/chores Somewhat difficult Somewhat difficult Not difficult at all - -      Interpretation of Total Score  Total Score Depression Severity:  1-4 = Minimal depression, 5-9 = Mild depression, 10-14 = Moderate depression, 15-19 = Moderately severe depression, 20-27 = Severe depression   Psychosocial Evaluation and Intervention:  Psychosocial Evaluation - 07/31/21 1338       Psychosocial Evaluation & Interventions   Interventions Encouraged to exercise with the program and follow exercise prescription;Stress management education    Comments Lorenzo is coming to  pulmonary rehab for COPD. He has been placed on O2 at night and sometimes will put himself on it during the day if he is feeling sluggish. He does report some stress concerns related to transportation and family. His friend who used to drive him places is also dealing with health issues so Cantrell has been using [employer] more. Regarding family, he states he has been having flashback dreams about his wife who died 8 years ago. This disrupts his sleep which is already struggling since he only gets about 3-4 hours of sleep a night (has been his norm since high school). His other family stressor is his daughter who he states he doesn't want to claim her as such after the way she has treated him, including selling some of his checks which he turned her in to the government for. She is local, but he hasnt seen her in 4 years. He lives in [place] where he enjoys the dance and yoga classes they offer. 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 He is hopeful this program will boost his stamina and improve his breathing.    Expected Outcomes Short; attend pulmonary rehab for education and exercise. Long: develop and maintain positive self care habits    Continue Psychosocial Services  Follow up required by staff             Psychosocial Re-Evaluation:  Psychosocial Re-Evaluation     [place] Name 09/15/21 0935 09/22/21 0934           Psychosocial Re-Evaluation   Current issues with Current Sleep Concerns Current Stress Concerns      Comments Zacheriah is doing well mentally. He denies any depression or anxiety . 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 He only sleeps 4 hours per night and takes naps throughout the day. He doesn't have any interest in taking any medicine but he is interested in increasing his liter flow for his oxygen during sleep. He knows to  stay above 88%. I encouraged him to call his doctor today to discuss what changes they can make and/or recommend to make his sleep a little better. Reviewed patient health questionnaire (  PHQ-9) with patient for follow up. Previously, patients score indicated signs/symptoms of depression.  Reviewed to see if patient is improving symptom wise while in program.  Score improved and patient states that it is because they have been coming to rehab and he has more energy again.      Expected Outcomes Short: Talk to doctor about sleep Long: Continue to utilize exercise for stress management and maintain positive attitude Short: Continue to attend [facility] regularly for regular exercise and social engagement. Long: Continue to improve symptoms and manage a positive mental state.      Interventions Encouraged to attend Pulmonary Rehabilitation for the exercise Encouraged to attend Pulmonary Rehabilitation for the exercise      Continue Psychosocial Services  Follow up required by staff Follow up required by staff               Psychosocial Discharge (Final Psychosocial Re-Evaluation):  Psychosocial Re-Evaluation - 09/22/21 0934       Psychosocial Re-Evaluation   Current issues with Current Stress Concerns    Comments Reviewed patient health questionnaire (PHQ-9) with patient for follow up. Previously, patients score indicated signs/symptoms of depression.  Reviewed to see if patient is improving symptom wise while in program.  Score improved and patient states that it is because they have been coming to rehab and he has more energy again.    Expected Outcomes Short: Continue to attend [facility] regularly for regular exercise and social engagement. Long: Continue to improve symptoms and manage a positive mental state.    Interventions Encouraged to attend Pulmonary Rehabilitation for the exercise    Continue Psychosocial Services  Follow up required by staff             Education: Education Goals:  Education classes will be provided on a weekly basis, covering required topics. Participant will state understanding/return demonstration of topics presented.  Learning Barriers/Preferences:  Learning Barriers/Preferences - 07/31/21 1327       Learning Barriers/Preferences   Learning Barriers None    Learning Preferences Individual Instruction             General Pulmonary Education Topics:  Infection Prevention: - Provides verbal and written material to individual with discussion of infection control including proper hand washing and proper equipment cleaning during exercise session. Flowsheet Row Pulmonary Rehab from 09/10/2021 in [facility] Cardiac and Pulmonary Rehab  Date 08/17/21  Educator [facility]  Instruction Review Code 1- Verbalizes Understanding       Falls Prevention: - Provides verbal and written material to individual with discussion of falls prevention and safety. Flowsheet Row Pulmonary Rehab from 09/10/2021 in [facility] Cardiac and Pulmonary Rehab  Date 08/17/21  Educator [facility]  Instruction Review Code 1- Verbalizes Understanding       Chronic Lung Disease Review: - Group verbal instruction with posters, models, PowerPoint presentations and videos,  to review new updates, new respiratory medications, new advancements in procedures and treatments. Providing information on websites and "800" numbers for continued self-education. Includes information about supplement oxygen, available portable oxygen systems, continuous and intermittent flow rates, oxygen safety, concentrators, and Medicare reimbursement for oxygen. Explanation of Pulmonary Drugs, including class, frequency, complications, importance of spacers, rinsing mouth after steroid MDI's, and proper cleaning methods for nebulizers. Review of basic lung anatomy and physiology related to function, structure, and complications of lung disease. Review of risk factors. Discussion about methods for diagnosing sleep apnea and types of  masks and machines for OSA. Includes a review of the use of types  of environmental controls: home humidity, furnaces, filters, dust mite/pet prevention, HEPA vacuums. Discussion about weather changes, air quality and the benefits of nasal washing. Instruction on Warning signs, infection symptoms, calling MD promptly, preventive modes, and value of vaccinations. Review of effective airway clearance, coughing and/or vibration techniques. Emphasizing that all should Create an Action Plan. Written material given at graduation. Flowsheet Row Pulmonary Rehab from 09/10/2021 in [facility] Cardiac and Pulmonary Rehab  Education need identified 08/17/21  Date 09/10/21  Educator [facility]  Instruction Review Code 1- Verbalizes Understanding       AED/CPR: - Group verbal and written instruction with the use of models to demonstrate the basic use of the AED with the basic ABC's of resuscitation.    Anatomy and Cardiac Procedures: - Group verbal and visual presentation and models provide information about basic cardiac anatomy and function. Reviews the testing methods done to diagnose heart disease and the outcomes of the test results. Describes the treatment choices: Medical Management, Angioplasty, or Coronary Bypass Surgery for treating various heart conditions including Myocardial Infarction, Angina, Valve Disease, and Cardiac Arrhythmias.  Written material given at graduation.   Medication Safety: - Group verbal and visual instruction to review commonly prescribed medications for heart and lung disease. Reviews the medication, class of the drug, and side effects. Includes the steps to properly store meds and maintain the prescription regimen.  Written material given at graduation.   Other: -Provides group and verbal instruction on various topics (see comments)   Knowledge Questionnaire Score:  Knowledge Questionnaire Score - 08/17/21 1606       Knowledge Questionnaire Score   Pre Score 9/18               Core Components/Risk Factors/Patient Goals at Admission:  Personal Goals and Risk Factors at Admission - 08/17/21 1602       Core Components/Risk Factors/Patient Goals on Admission    Weight Management Yes    Intervention Weight Management: Develop a combined nutrition and exercise program designed to reach desired caloric intake, while maintaining appropriate intake of nutrient and fiber, sodium and fats, and appropriate energy expenditure required for the weight goal.;Weight Management: Provide education and appropriate resources to help participant work on and attain dietary goals.;Weight Management/Obesity: Establish reasonable short term and long term weight goals.;Obesity: Provide education and appropriate resources to help participant work on and attain dietary goals.    Admit Weight 208 lb 4.8 oz (94.5 kg)    Goal Weight: Short Term 203 lb (92.1 kg)    Goal Weight: Long Term 198 lb (89.8 kg)    Expected Outcomes Short Term: Continue to assess and modify interventions until short term weight is achieved;Long Term: Adherence to nutrition and physical activity/exercise program aimed toward attainment of established weight goal;Understanding recommendations for meals to include 15-35% energy as protein, 25-35% energy from fat, 35-60% energy from carbohydrates, less than 200mg  of dietary cholesterol, 20-35 gm of total fiber daily;Understanding of distribution of calorie intake throughout the day with the consumption of 4-5 meals/snacks;Weight Loss: Understanding of general recommendations for a balanced deficit meal plan, which promotes 1-2 lb weight loss per week and includes a negative energy balance of [phone] kcal/d    Tobacco Cessation Yes    Number of packs per day 2-3 cigs a day    Intervention Assist the participant in steps to quit. Provide individualized education and counseling about committing to Tobacco Cessation, relapse prevention, and pharmacological support that can be  provided by physician.;Advice worker, assist with  locating and accessing local/national Quit Smoking programs, and support quit date choice.    Expected Outcomes Short Term: Will demonstrate readiness to quit, by selecting a quit date.;Short Term: Will quit all tobacco product use, adhering to prevention of relapse plan.;Long Term: Complete abstinence from all tobacco products for at least 12 months from quit date.    Improve shortness of breath with ADL's Yes    Intervention Provide education, individualized exercise plan and daily activity instruction to help decrease symptoms of SOB with activities of daily living.    Expected Outcomes Short Term: Improve cardiorespiratory fitness to achieve a reduction of symptoms when performing ADLs;Long Term: Be able to perform more ADLs without symptoms or delay the onset of symptoms    Diabetes Yes    Intervention Provide education about signs/symptoms and action to take for hypo/hyperglycemia.;Provide education about proper nutrition, including hydration, and aerobic/resistive exercise prescription along with prescribed medications to achieve blood glucose in normal ranges: Fasting glucose 65-99 mg/dL    Expected Outcomes Short Term: Participant verbalizes understanding of the signs/symptoms and immediate care of hyper/hypoglycemia, proper foot care and importance of medication, aerobic/resistive exercise and nutrition plan for blood glucose control.;Long Term: Attainment of HbA1C < 7%.    Hypertension Yes    Intervention Provide education on lifestyle modifcations including regular physical activity/exercise, weight management, moderate sodium restriction and increased consumption of fresh fruit, vegetables, and low fat dairy, alcohol moderation, and smoking cessation.;Monitor prescription use compliance.    Expected Outcomes Short Term: Continued assessment and intervention until BP is < 140/33mm HG in hypertensive participants. < 130/76mm HG in  hypertensive participants with diabetes, heart failure or chronic kidney disease.;Long Term: Maintenance of blood pressure at goal levels.             Education:Diabetes - Individual verbal and written instruction to review signs/symptoms of diabetes, desired ranges of glucose level fasting, after meals and with exercise. Acknowledge that pre and post exercise glucose checks will be done for 3 sessions at entry of program. Flowsheet Row Pulmonary Rehab from 07/31/2021 in [facility] Cardiac and Pulmonary Rehab  Date 07/31/21  Educator [facility]  Instruction Review Code 1- Verbalizes Understanding       Know Your Numbers and Heart Failure: - Group verbal and visual instruction to discuss disease risk factors for cardiac and pulmonary disease and treatment options.  Reviews associated critical values for Overweight/Obesity, Hypertension, Cholesterol, and Diabetes.  Discusses basics of heart failure: signs/symptoms and treatments.  Introduces Heart Failure Zone chart for action plan for heart failure.  Written material given at graduation. Flowsheet Row Pulmonary Rehab from 09/10/2021 in [facility] Cardiac and Pulmonary Rehab  Date 09/03/21  Educator [facility]  Instruction Review Code 1- Verbalizes Understanding       Core Components/Risk Factors/Patient Goals Review:   Goals and Risk Factor Review     Row Name 09/15/21 0930             Core Components/Risk Factors/Patient Goals Review   Personal Goals Review Weight Management/Obesity;Tobacco Cessation;Diabetes;Improve shortness of breath with ADL's       Review Nelton stopped smoking about 1 week ago and is proud if himself for maintaining it thus far. He isn't using any resources to help him quit, however, I did provide him a smoking resource packet to reference to in case he needs it. He is checking his blood sugars twice daily and does not report any abnormal numbers. Harvery does not weigh himself at home as he does not have a  scale. His goal is 195 lb-  he is meeting with the RD after his session and wants to focus on diet changes to help with weight loss. He is still new in the program and hopes this will help relieve any SOB.       Expected Outcomes Short: Continue smoking cessation Long: Continue to manage lifestyle risk factors                Core Components/Risk Factors/Patient Goals at Discharge (Final Review):   Goals and Risk Factor Review - 09/15/21 0930       Core Components/Risk Factors/Patient Goals Review   Personal Goals Review Weight Management/Obesity;Tobacco Cessation;Diabetes;Improve shortness of breath with ADL's    Review Salomon stopped smoking about 1 week ago and is proud if himself for maintaining it thus far. He isn't using any resources to help him quit, however, I did provide him a smoking resource packet to reference to in case he needs it. He is checking his blood sugars twice daily and does not report any abnormal numbers. Harvery does not weigh himself at home as he does not have a scale. His goal is 195 lb- he is meeting with the RD after his session and wants to focus on diet changes to help with weight loss. He is still new in the program and hopes this will help relieve any SOB.    Expected Outcomes Short: Continue smoking cessation Long: Continue to manage lifestyle risk factors             ITP Comments:  ITP Comments     Row Name 07/31/21 1323 08/17/21 1548 09/01/21 0915 09/02/21 0625 09/15/21 1126   ITP Comments Initial telephone orientation completed. Diagnosis can be found in [facility] 9/23. EP orientation scheduled for Monday 11/14 at 1:30 Completed 6MWT and gym orientation. Initial ITP created and sent for review to Dr. Zetta Bills, Medical Director. First full day of exercise!  Patient was oriented to gym and equipment including functions, settings, policies, and procedures.  Patient's individual exercise prescription and treatment plan were reviewed.  All starting workloads were established based  on the results of the 6 minute walk test done at initial orientation visit.  The plan for exercise progression was also introduced and progression will be customized based on patient's performance and goals. 30 Day review completed. Medical Director ITP review done, changes made as directed, and signed approval by Medical Director.   New to program Completed initial RD evaluation    Row Name 09/30/21 0644           ITP Comments 30 Day review completed. Medical Director ITP review done, changes made as directed, and signed approval by Medical Director.                Comments:

## 2021-10-01 ENCOUNTER — Other Ambulatory Visit: Payer: Self-pay

## 2021-10-01 DIAGNOSIS — J449 Chronic obstructive pulmonary disease, unspecified: Secondary | ICD-10-CM

## 2021-10-01 NOTE — Progress Notes (Signed)
Daily Session Note  Patient Details  Name: Nathaniel Little MRN: [id number] Date of Birth: 10-31-1945 Referring Provider:   Flowsheet Row Pulmonary Rehab from 08/17/2021 in [facility] Cardiac and Pulmonary Rehab  Referring Provider Dr. Patsey Berthold       Encounter Date: 10/01/2021  Check In:  Session Check In - 10/01/21 0903       Check-In   Supervising physician immediately available to respond to emergencies See telemetry face sheet for immediately available ER MD    Location [facility]-Cardiac & Pulmonary Rehab    Staff Present Alberteen Sam, MA, RCEP, CCRP, CCET;Amanda Sommer, BA, ACSM CEP, Exercise Physiologist;Erendira Crabtree, RN,BC,MSN;Melissa Garland, RDN, LDN    Virtual Visit No    Medication changes reported     No    Fall or balance concerns reported    No    Tobacco Cessation No Change    Warm-up and Cool-down Performed on first and last piece of equipment    Resistance Training Performed Yes    VAD Patient? No    PAD/SET Patient? No      Pain Assessment   Currently in Pain? No/denies                Social History   Tobacco Use  Smoking Status Every Day   Packs/day: 4.00   Years: 68.00   Pack years: 272.00   Types: Cigarettes  Smokeless Tobacco Former   Types: Chew, Snuff   Quit date: 2000  Tobacco Comments   2-3 cigs a day     Goals Met:  Independence with exercise equipment Exercise tolerated well No report of concerns or symptoms today  Goals Unmet:  Not Applicable  Comments: Pt able to follow exercise prescription today without complaint.  Will continue to monitor for progression.    Dr. Emily Filbert is Medical Director for [place].  Dr. Ottie Glazier is Medical Director for [facility] Pulmonary Rehabilitation.

## 2021-10-06 ENCOUNTER — Encounter: Payer: Medicare Other | Attending: Pulmonary Disease

## 2021-10-06 DIAGNOSIS — J449 Chronic obstructive pulmonary disease, unspecified: Secondary | ICD-10-CM | POA: Insufficient documentation

## 2021-10-07 ENCOUNTER — Telehealth: Payer: Self-pay | Admitting: Podiatry

## 2021-10-07 NOTE — Telephone Encounter (Signed)
Pt left message asking if his shoes were in yet.  I returned call upon checking and  his voicemail is full but we have not gotten a doctor to sign the paperwork as of yet. I need a md/do name so I can get paperwork sent to them if pt is to call back.

## 2021-10-08 ENCOUNTER — Telehealth: Payer: Self-pay

## 2021-10-08 ENCOUNTER — Ambulatory Visit: Payer: Medicare Other | Admitting: Pulmonary Disease

## 2021-10-08 NOTE — Telephone Encounter (Signed)
Patient had a family emergency and reported he is not able to come to rehab this week. He is unsure about next week and will call us and keep [name] updated.

## 2021-10-09 ENCOUNTER — Telehealth: Payer: Self-pay | Admitting: *Deleted

## 2021-10-09 NOTE — Telephone Encounter (Signed)
"  This is the seventh time I have tried to get a hold of you people and no body wants to talk to me.  So, If you don't want to do that, you can forget about me as a patient."  I attempted to return his call.  His mailbox was full.  I could not leave a message.  I looked at his release of information form and his phone number is listed as [phone].  We may have had the wrong number listed for him.  I left a voicemail message.  I informed him we will try to reach him again on Monday.

## 2021-10-15 ENCOUNTER — Encounter: Payer: Medicare Other | Admitting: *Deleted

## 2021-10-15 ENCOUNTER — Other Ambulatory Visit: Payer: Self-pay

## 2021-10-15 DIAGNOSIS — J449 Chronic obstructive pulmonary disease, unspecified: Secondary | ICD-10-CM | POA: Diagnosis not present

## 2021-10-15 NOTE — Progress Notes (Signed)
Daily Session Note  Patient Details  Name: Nathaniel Little MRN: [id number] Date of Birth: [birth year] Referring Provider:   Flowsheet Row Pulmonary Rehab from 08/17/2021 in [facility] Cardiac [facility]  Referring Provider Dr. Patsey Berthold       Encounter Date: 10/15/2021  Check In:  Session Check In - 10/15/21 1330       Check-In   Supervising physician immediately available to respond to emergencies See telemetry face sheet for immediately available ER MD    Location [facility]-Cardiac & Pulmonary Rehab    Staff Present Renita Papa, RN BSN;Joseph [place], RCP,RRT,BSRT;Jessica Catharine, [place], RCEP, CCRP, CCET    Virtual Visit No    Medication changes reported     No    Fall or balance concerns reported    No    Tobacco Cessation No Change    Warm-up and Cool-down Performed on first and last piece of equipment    Resistance Training Performed Yes    VAD Patient? No    PAD/SET Patient? No      Pain Assessment   Currently in Pain? No/denies                Social History   Tobacco Use  Smoking Status Every Day   Packs/day: 4.00   Years: 68.00   Pack years: 272.00   Types: Cigarettes  Smokeless Tobacco Former   Types: Chew, Snuff   Quit date: 2000  Tobacco Comments   2-3 cigs a day     Goals Met:  Independence with exercise equipment Exercise tolerated well No report of concerns or symptoms today Strength training completed today  Goals Unmet:  Not Applicable  Comments: Pt able to follow exercise prescription today without complaint.  Will continue to monitor for progression.    Dr. Emily Filbert is Medical Director for [place].  Dr. Ottie Glazier is Medical Director for [facility] Pulmonary Rehabilitation.

## 2021-10-15 NOTE — Telephone Encounter (Signed)
I attempted to call the patient again.  I left him a message for him to call me back on tomorrow.  I'm not sure if I'm calling the correct number.   I finally reached Nathaniel Little on his home phone.  I informed him that we had been trying to reach him but his voicemail has been full and we weren't able to leave a message.  I asked him how I could help him.  He said his feet are killing him.  I asked him if he wanted to schedule an appointment.  He said he has one scheduled for February.  I informed him that we needed to know who his Diabetic doctor is so we can get authorization for the Diabetic shoes.  He said his doctor quit and he doesn't know who the new doctor is.  I asked him to call them and see if he can find out and let us know the name of the doctor.  He said he would call them tomorrow and have them call us.

## 2021-10-17 ENCOUNTER — Other Ambulatory Visit: Payer: Self-pay | Admitting: Internal Medicine

## 2021-10-17 NOTE — Telephone Encounter (Signed)
Requested Prescriptions  Pending Prescriptions Disp Refills   cloNIDine (CATAPRES) 0.1 MG tablet [Pharmacy Med Name: CLONIDINE HCL 0.1 MG TAB] 60 tablet 1    Sig: TAKE 1 TABLET BY MOUTH TWICE DAILY     Cardiovascular:  Alpha-2 Agonists Failed - 10/17/2021  2:16 PM      Failed - Last BP in normal range    BP Readings from Last 1 Encounters:  08/10/21 (!) 149/63         Passed - Last Heart Rate in normal range    Pulse Readings from Last 1 Encounters:  08/10/21 84         Passed - Valid encounter within last 6 months    Recent Outpatient Visits          2 months ago Blood in [place], NP   3 months ago Abdominal bloating   [facility] [place], [place] W, NP   5 months ago DM type 2 with diabetic peripheral neuropathy [facility])   [facility], Coralie Keens, NP   9 months ago Mixed hyperlipidemia   [place], NP   1 year ago DM type 2 with diabetic peripheral neuropathy [facility])   [facility], [name], FNP      Future Appointments            In 3 days Tyler Pita, MD [facility] [place]   In 1 month  [facility], [facility]

## 2021-10-19 ENCOUNTER — Ambulatory Visit: Payer: Medicare Other | Admitting: Podiatry

## 2021-10-20 ENCOUNTER — Ambulatory Visit: Payer: Commercial Managed Care - HMO | Admitting: Pulmonary Disease

## 2021-10-21 ENCOUNTER — Other Ambulatory Visit: Payer: Self-pay

## 2021-10-21 DIAGNOSIS — J449 Chronic obstructive pulmonary disease, unspecified: Secondary | ICD-10-CM

## 2021-10-21 NOTE — Progress Notes (Signed)
Daily Session Note  Patient Details  Name: Nathaniel Little MRN: [id number] Date of Birth: 14-May-1946 Referring Provider:   Flowsheet Row Pulmonary Rehab from 08/17/2021 in [facility] Cardiac and Pulmonary Rehab  Referring Provider Dr. Patsey Berthold       Encounter Date: 10/21/2021  Check In:  Session Check In - 10/21/21 1327       Check-In   Supervising physician immediately available to respond to emergencies See telemetry face sheet for immediately available ER MD    Location [facility]-Cardiac & Pulmonary Rehab    Staff Present Birdie Sons, MPA, RN;Joseph Lou Miner, MS, ASCM CEP, Exercise Physiologist    Virtual Visit No    Medication changes reported     No    Fall or balance concerns reported    No    Tobacco Cessation No Change    Warm-up and Cool-down Performed on first and last piece of equipment    Resistance Training Performed Yes    VAD Patient? No    PAD/SET Patient? No      Pain Assessment   Currently in Pain? No/denies                Social History   Tobacco Use  Smoking Status Every Day   Packs/day: 4.00   Years: 68.00   Pack years: 272.00   Types: Cigarettes  Smokeless Tobacco Former   Types: Chew, Snuff   Quit date: 2000  Tobacco Comments   2-3 cigs a day     Goals Met:  Independence with exercise equipment Exercise tolerated well No report of concerns or symptoms today Strength training completed today  Goals Unmet:  Not Applicable  Comments: Pt able to follow exercise prescription today without complaint.  Will continue to monitor for progression.    Dr. Emily Filbert is Medical Director for [place].  Dr. Ottie Glazier is Medical Director for [facility] Pulmonary Rehabilitation.

## 2021-10-23 ENCOUNTER — Other Ambulatory Visit: Payer: Self-pay

## 2021-10-23 DIAGNOSIS — I5030 Unspecified diastolic (congestive) heart failure: Secondary | ICD-10-CM | POA: Diagnosis not present

## 2021-10-23 DIAGNOSIS — J449 Chronic obstructive pulmonary disease, unspecified: Secondary | ICD-10-CM | POA: Diagnosis not present

## 2021-10-23 DIAGNOSIS — J431 Panlobular emphysema: Secondary | ICD-10-CM

## 2021-10-23 MED ORDER — ALBUTEROL SULFATE HFA 108 (90 BASE) MCG/ACT IN AERS: 2.0000 | INHALATION_SPRAY | Freq: Four times a day (QID) | RESPIRATORY_TRACT | 1 refills | Status: DC | PRN

## 2021-10-24 ENCOUNTER — Other Ambulatory Visit: Payer: Self-pay

## 2021-10-24 ENCOUNTER — Encounter: Payer: Self-pay | Admitting: Emergency Medicine

## 2021-10-24 ENCOUNTER — Emergency Department: Payer: Medicare Other

## 2021-10-24 ENCOUNTER — Emergency Department
Admission: EM | Admit: 2021-10-24 | Discharge: 2021-10-24 | Disposition: A | Discharge status: Home / Self Care | Payer: Medicare Other | Attending: Emergency Medicine | Admitting: Emergency Medicine

## 2021-10-24 DIAGNOSIS — I499 Cardiac arrhythmia, unspecified: Secondary | ICD-10-CM | POA: Diagnosis not present

## 2021-10-24 DIAGNOSIS — I110 Hypertensive heart disease with heart failure: Secondary | ICD-10-CM | POA: Insufficient documentation

## 2021-10-24 DIAGNOSIS — J441 Chronic obstructive pulmonary disease with (acute) exacerbation: Secondary | ICD-10-CM | POA: Diagnosis not present

## 2021-10-24 DIAGNOSIS — R0602 Shortness of breath: Secondary | ICD-10-CM | POA: Diagnosis not present

## 2021-10-24 DIAGNOSIS — I509 Heart failure, unspecified: Secondary | ICD-10-CM | POA: Diagnosis not present

## 2021-10-24 DIAGNOSIS — Z85118 Personal history of other malignant neoplasm of bronchus and lung: Secondary | ICD-10-CM | POA: Diagnosis not present

## 2021-10-24 DIAGNOSIS — J90 Pleural effusion, not elsewhere classified: Secondary | ICD-10-CM | POA: Diagnosis not present

## 2021-10-24 DIAGNOSIS — E119 Type 2 diabetes mellitus without complications: Secondary | ICD-10-CM | POA: Diagnosis not present

## 2021-10-24 DIAGNOSIS — Z20822 Contact with and (suspected) exposure to covid-19: Secondary | ICD-10-CM | POA: Diagnosis not present

## 2021-10-24 DIAGNOSIS — I2510 Atherosclerotic heart disease of native coronary artery without angina pectoris: Secondary | ICD-10-CM | POA: Insufficient documentation

## 2021-10-24 DIAGNOSIS — R6889 Other general symptoms and signs: Secondary | ICD-10-CM | POA: Diagnosis not present

## 2021-10-24 DIAGNOSIS — Z743 Need for continuous supervision: Secondary | ICD-10-CM | POA: Diagnosis not present

## 2021-10-24 LAB — CBC
HCT: 37.4 % — ABNORMAL LOW (ref 39.0–52.0)
Hemoglobin: 12.4 g/dL — ABNORMAL LOW (ref 13.0–17.0)
MCH: 29.6 pg (ref 26.0–34.0)
MCHC: 33.2 g/dL (ref 30.0–36.0)
MCV: 89.3 fL (ref 80.0–100.0)
Platelets: 181 10*3/uL (ref 150–400)
RBC: 4.19 MIL/uL — ABNORMAL LOW (ref 4.22–5.81)
RDW: 13.7 % (ref 11.5–15.5)
WBC: 9.2 10*3/uL (ref 4.0–10.5)
nRBC: 0 % (ref 0.0–0.2)

## 2021-10-24 LAB — COMPREHENSIVE METABOLIC PANEL
ALT: 39 U/L (ref 0–44)
AST: 34 U/L (ref 15–41)
Albumin: 3.5 g/dL (ref 3.5–5.0)
Alkaline Phosphatase: 116 U/L (ref 38–126)
Anion gap: 5 (ref 5–15)
BUN: 24 mg/dL — ABNORMAL HIGH (ref 8–23)
CO2: 27 mmol/L (ref 22–32)
Calcium: 9.3 mg/dL (ref 8.9–10.3)
Chloride: 104 mmol/L (ref 98–111)
Creatinine, Ser: 1.54 mg/dL — ABNORMAL HIGH (ref 0.61–1.24)
GFR, Estimated: 46 mL/min — ABNORMAL LOW (ref >60)
Glucose, Bld: 239 mg/dL — ABNORMAL HIGH (ref 70–99)
Potassium: 4.1 mmol/L (ref 3.5–5.1)
Sodium: 136 mmol/L (ref 135–145)
Total Bilirubin: 0.4 mg/dL (ref 0.3–1.2)
Total Protein: 6.9 g/dL (ref 6.5–8.1)

## 2021-10-24 LAB — BRAIN NATRIURETIC PEPTIDE: B Natriuretic Peptide: 59.1 pg/mL (ref 0.0–100.0)

## 2021-10-24 LAB — RESP PANEL BY RT-PCR (FLU A&B, COVID) ARPGX2
Influenza A by PCR: NEGATIVE
Influenza B by PCR: NEGATIVE
SARS Coronavirus 2 by RT PCR: NEGATIVE

## 2021-10-24 LAB — TROPONIN I (HIGH SENSITIVITY)
Troponin I (High Sensitivity): 19 ng/L — ABNORMAL HIGH (ref <18)
Troponin I (High Sensitivity): 18 ng/L — ABNORMAL HIGH (ref <18)

## 2021-10-24 MED ORDER — AMOXICILLIN-POT CLAVULANATE 875-125 MG PO TABS
1.0000 | ORAL_TABLET | Freq: Once | ORAL | Status: AC
  Administered 2021-10-24: 1 via ORAL
  Filled 2021-10-24: qty 1

## 2021-10-24 MED ORDER — PREDNISONE 20 MG PO TABS: 60.0000 mg | ORAL_TABLET | Freq: Every day | ORAL | 0 refills | Status: AC

## 2021-10-24 MED ORDER — PREDNISONE 20 MG PO TABS
60.0000 mg | ORAL_TABLET | Freq: Once | ORAL | Status: AC
  Administered 2021-10-24: 60 mg via ORAL
  Filled 2021-10-24: qty 3

## 2021-10-24 MED ORDER — IPRATROPIUM-ALBUTEROL 0.5-2.5 (3) MG/3ML IN SOLN
3.0000 mL | Freq: Once | RESPIRATORY_TRACT | Status: AC
  Administered 2021-10-24: 3 mL via RESPIRATORY_TRACT
  Filled 2021-10-24: qty 3

## 2021-10-24 MED ORDER — AMOXICILLIN-POT CLAVULANATE 875-125 MG PO TABS: 1.0000 | ORAL_TABLET | Freq: Two times a day (BID) | ORAL | 0 refills | Status: AC

## 2021-10-24 NOTE — ED Triage Notes (Signed)
Pt reports feeling shortness of breath for the past couple of days, productive cough. Pt talks in complete sentences. Pt reports reports he has left lower lobectomy about a yr ago. Pt denies any other symptoms at present.

## 2021-10-24 NOTE — ED Notes (Signed)
Troponin 41

## 2021-10-24 NOTE — ED Triage Notes (Signed)
FIRST NURSE NOTE:  Pt arrived via [facility] with reports of shortness of breath.  Pt was without oxygen c/o shortness of breath. Did not have his oxygen.  VSS 98% 2L with EMS.

## 2021-10-24 NOTE — ED Provider Notes (Signed)
[facility] Provider Note    Event Date/Time   First MD Initiated Contact with Patient 10/24/21 1127     (approximate)   History   Chief Complaint Shortness of Breath   HPI  Nathaniel Little is a 76 y.o. male with past medical history of hypertension, diabetes, CAD, CHF, COPD on 2 L nasal cannula, lung cancer status post lobectomy, and GERD who presents to the ED complaining of shortness of breath.  Patient reports that over the past 2 days he has been dealing with cough productive of thick yellowish sputum along with increasing difficulty breathing.  He reports subjective fevers and chills, but has not taken his temperature at home.  He denies any pain in his chest, does state that his nebulizer at home has been helping with the symptoms.  He received a breathing treatment upon arrival to the ED, now states his breathing feels much better.  He is not aware of any sick contacts, denies any nausea, vomiting, abdominal pain, or diarrhea.     Physical Exam   Triage Vital Signs: ED Triage Vitals  Enc Vitals Group     BP 10/24/21 0036 (!) 159/68     Pulse Rate 10/24/21 0036 74     Resp 10/24/21 0036 20     Temp 10/24/21 0036 98 F (36.7 C)     Temp Source 10/24/21 0036 Oral     SpO2 10/24/21 0036 96 %     Weight 10/24/21 0041 214 lb (97.1 kg)     Height 10/24/21 0041 5\' 9"  (1.753 m)     Head Circumference --      Peak Flow --      Pain Score 10/24/21 0041 5     Pain Loc --      Pain Edu? --      Excl. in [place]? --     Most recent vital signs: Vitals:   10/24/21 0728 10/24/21 1214  BP: 112/77 (!) 143/87  Pulse: 69 70  Resp: 17 18  Temp:    SpO2: 98% 97%    Constitutional: Alert and oriented. Eyes: Conjunctivae are normal. Head: Atraumatic. Nose: No congestion/rhinnorhea. Mouth/Throat: Mucous membranes are moist. Cardiovascular: Normal rate, regular rhythm. Grossly normal heart sounds.  2+ radial pulses bilaterally. Respiratory: Normal respiratory  effort.  No retractions.  Left lung sounds diminished, right lung fields with expiratory wheezing. Gastrointestinal: Soft and nontender. No distention. Musculoskeletal: No lower extremity tenderness nor edema.  Neurologic:  Normal speech and language. No gross focal neurologic deficits are appreciated.    ED Results / Procedures / Treatments   Labs (all labs ordered are listed, but only abnormal results are displayed) Labs Reviewed  CBC - Abnormal; Notable for the following components:      Result Value   RBC 4.19 (*)    Hemoglobin 12.4 (*)    HCT 37.4 (*)    All other components within normal limits  COMPREHENSIVE METABOLIC PANEL - Abnormal; Notable for the following components:   Glucose, Bld 239 (*)    BUN 24 (*)    Creatinine, Ser 1.54 (*)    GFR, Estimated 46 (*)    All other components within normal limits  TROPONIN I (HIGH SENSITIVITY) - Abnormal; Notable for the following components:   Troponin I (High Sensitivity) 19 (*)    All other components within normal limits  TROPONIN I (HIGH SENSITIVITY) - Abnormal; Notable for the following components:   Troponin I (High Sensitivity) 18 (*)  All other components within normal limits  RESP PANEL BY RT-PCR (FLU A&B, COVID) ARPGX2  BRAIN NATRIURETIC PEPTIDE     EKG  ED ECG REPORT I, Blake Divine, the attending physician, personally viewed and interpreted this ECG.   Date: 10/24/2021  EKG Time: 00:48  Rate: 70  Rhythm: normal sinus rhythm, occasional PVC noted, unifocal  Axis: LAD  Intervals:none  ST&T Change: None  RADIOLOGY Chest x-ray reviewed by me with no infiltrate or edema, small left-sided effusion noted similar to previous.  PROCEDURES:  Critical Care performed: No  Procedures   MEDICATIONS ORDERED IN ED: Medications  ipratropium-albuterol (DUONEB) 0.5-2.5 (3) MG/3ML nebulizer solution 3 mL (3 mLs Nebulization Given 10/24/21 0115)  ipratropium-albuterol (DUONEB) 0.5-2.5 (3) MG/3ML nebulizer  solution 3 mL (3 mLs Nebulization Given 10/24/21 1224)  predniSONE (DELTASONE) tablet 60 mg (60 mg Oral Given 10/24/21 1217)  amoxicillin-clavulanate (AUGMENTIN) 875-125 MG per tablet 1 tablet (1 tablet Oral Given 10/24/21 1216)     IMPRESSION / MDM / ASSESSMENT AND PLAN / ED COURSE  I reviewed the triage vital signs and the nursing notes.                              76 y.o. male with past medical history of hypertension, diabetes, CAD, CHF, COPD on 2 L nasal cannula, lung cancer status post lobectomy, and GERD who presents to the ED with productive cough and increasing difficulty breathing over the past 2 days.  Differential diagnosis includes, but is not limited to, COPD exacerbation, CHF exacerbation, ACS, PE, pneumonia, pleural effusion, viral syndrome.  Patient is well-appearing and in no acute distress, maintaining O2 sats on his usual 2 L nasal cannula.  He does have significant wheezing in his right lung fields, states he feels better following initial breathing treatment.  Suspect symptoms are related to COPD exacerbation, no obvious pneumonia noted on chest x-ray however given his purulent sputum production, he would benefit from course of antibiotics.  We will give dose of Augmentin, additional breathing treatment, and dose of steroids.  Labs are reassuring, BMP shows renal function stable from previous, 2 sets of troponin are stable, and CBC shows no anemia.  Wheezing is improved on reassessment and patient reports his breathing is almost back to normal.  He is appropriate for outpatient management and was counseled to follow-up with his PCP or pulmonologist.  Will prescribe course of Augmentin and steroids, he was counseled to closely watch his blood glucose while on steroids.  He was counseled to return to the ED for new worsening symptoms, patient agrees to plan.       FINAL CLINICAL IMPRESSION(S) / ED DIAGNOSES   Final diagnoses:  COPD exacerbation ([place])  Shortness of breath      Rx / DC Orders   ED Discharge Orders          Ordered    amoxicillin-clavulanate (AUGMENTIN) 875-125 MG tablet  2 times daily        10/24/21 1329    predniSONE (DELTASONE) 20 MG tablet  Daily with breakfast        10/24/21 1329             Note:  This document was prepared using Dragon voice recognition software and may include unintentional dictation errors.   Blake Divine, MD 10/24/21 1330

## 2021-10-25 DIAGNOSIS — J449 Chronic obstructive pulmonary disease, unspecified: Secondary | ICD-10-CM | POA: Diagnosis not present

## 2021-10-25 DIAGNOSIS — I5030 Unspecified diastolic (congestive) heart failure: Secondary | ICD-10-CM | POA: Diagnosis not present

## 2021-10-26 ENCOUNTER — Encounter: Payer: Self-pay | Admitting: *Deleted

## 2021-10-26 ENCOUNTER — Telehealth: Payer: Self-pay | Admitting: *Deleted

## 2021-10-26 DIAGNOSIS — J449 Chronic obstructive pulmonary disease, unspecified: Secondary | ICD-10-CM

## 2021-10-26 NOTE — Telephone Encounter (Signed)
Nathaniel Little called to let us know that he will be out for the week as he tested positive for COVID on 10/23/21.  He can return to rehab on 11/04/21.

## 2021-10-28 ENCOUNTER — Encounter: Payer: Self-pay | Admitting: *Deleted

## 2021-10-28 ENCOUNTER — Other Ambulatory Visit: Payer: Medicare Other

## 2021-10-28 DIAGNOSIS — J449 Chronic obstructive pulmonary disease, unspecified: Secondary | ICD-10-CM

## 2021-10-28 NOTE — Progress Notes (Signed)
Pulmonary Individual Treatment Plan  Patient Details  Name: Nathaniel Little MRN: [id number] Date of Birth: 1946-01-21 Referring Provider:   Flowsheet Row Pulmonary Rehab from 08/17/2021 in [facility] Cardiac and Pulmonary Rehab  Referring Provider Dr. Patsey Berthold       Initial Encounter Date:  Flowsheet Row Pulmonary Rehab from 08/17/2021 in [facility] Cardiac and Pulmonary Rehab  Date 08/17/21       Visit Diagnosis: Chronic obstructive pulmonary disease, unspecified COPD type ([place])  Patient's Home Medications on Admission:  Current Outpatient Medications:    glucose blood (ACCU-CHEK GUIDE) test strip, Check blood sugar twice daily, Disp: 100 each, Rfl: 12   albuterol (VENTOLIN HFA) 108 (90 Base) MCG/ACT inhaler, Inhale 2 puffs into the lungs every 6 (six) hours as needed for wheezing or shortness of breath., Disp: 18 g, Rfl: 1   amoxicillin-clavulanate (AUGMENTIN) 875-125 MG tablet, Take 1 tablet by mouth 2 (two) times daily for 7 days., Disp: 14 tablet, Rfl: 0   aspirin EC 81 MG tablet, Take 81 mg by mouth daily., Disp: , Rfl:    BREZTRI AEROSPHERE 160-9-4.8 MCG/ACT AERO, INHALE 2 PUFFS INTO THE LUNGS TWICE DAILY, Disp: 10.7 g, Rfl: 0   calcium-vitamin D (OSCAL WITH D) 250-125 MG-UNIT tablet, Take 1 tablet by mouth daily., Disp: , Rfl:    carvedilol (COREG) 6.25 MG tablet, Take 1 tablet (6.25 mg total) by mouth 2 (two) times daily with a meal., Disp: 180 tablet, Rfl: 3   cloNIDine (CATAPRES) 0.1 MG tablet, TAKE 1 TABLET BY MOUTH TWICE DAILY, Disp: 60 tablet, Rfl: 1   ezetimibe (ZETIA) 10 MG tablet, Take 1 tablet (10 mg total) by mouth daily., Disp: 90 tablet, Rfl: 3   gabapentin (NEURONTIN) 100 MG capsule, Take 1-2 capsules (100-200 mg total) by mouth at bedtime., Disp: 180 capsule, Rfl: 1   insulin glargine (LANTUS) 100 unit/mL SOPN, Inject 20 Units into the skin at bedtime., Disp: , Rfl:    ipratropium-albuterol (DUONEB) 0.5-2.5 (3) MG/3ML SOLN, Take 3 mLs by nebulization every 6 (six) hours as  needed (Asthma)., Disp: 360 mL, Rfl: 12   ketoconazole (NIZORAL) 2 % cream, Apply 1 application topically daily., Disp: 15 g, Rfl: 0   metFORMIN (GLUCOPHAGE) 500 MG tablet, Take 1,000 mg by mouth daily with breakfast. 2 in the morning 1 at night., Disp: , Rfl:    pantoprazole (PROTONIX) 40 MG tablet, Take 1 tablet (40 mg total) by mouth 2 (two) times daily., Disp: 60 tablet, Rfl: 11   potassium chloride (KLOR-CON) 10 MEQ tablet, Take 3 tablets (30 mEq total) by mouth daily., Disp: 90 tablet, Rfl: 6   predniSONE (DELTASONE) 20 MG tablet, Take 3 tablets (60 mg total) by mouth daily with breakfast for 5 days., Disp: 15 tablet, Rfl: 0   torsemide (DEMADEX) 10 MG tablet, Take 1 tablet (10 mg total) by mouth daily., Disp: 30 tablet, Rfl: 6  Past Medical History: Past Medical History:  Diagnosis Date   (HFpEF) heart failure with preserved ejection fraction ([place])    a. 08/2017 Echo: EF 60-65%, Gr1 DD.   Arthritis    COPD (chronic obstructive pulmonary disease) ([place])    Coronary artery disease 2021   Depression    Diabetes mellitus without complication (HCC)    Hypertension    Incidental pulmonary nodule, greater than or equal to 42m 03/11/2020   Left lower lobe - suspicious for bronchogenic neoplasm   Lymphedema    Morbid obesity ([facility]    Prostate cancer ([facility] 03/2013   ? PANCREATIC  S/P TAVR (transcatheter aortic valve replacement) 04/01/2020   s/p TAVR with a 29 mm Edwards Sapien 3 THV via the TF approach by Drs Buena Irish & Roxy Manns   Severe aortic stenosis    Sleep apnea with use of continuous positive airway pressure (CPAP)    Tobacco abuse    Tremor    HEAD    Tobacco Use: Social History   Tobacco Use  Smoking Status Every Day   Packs/day: 4.00   Years: 68.00   Pack years: 272.00   Types: Cigarettes  Smokeless Tobacco Former   Types: Chew, Snuff   Quit date: 2000  Tobacco Comments   2-3 cigs a day     Labs: Recent Review Flowsheet Data     Labs for ITP Cardiac and  Pulmonary Rehab Latest Ref Rng & Units 09/11/2020 11/14/2020 01/06/2021 04/14/2021 05/18/2021   Cholestrol <200 mg/dL - - 125 - 116   LDLCALC mg/dL (calc) - - 66 - 53   HDL > OR = 40 mg/dL - - 36(L) - 45   Trlycerides <150 mg/dL - - 157(H) - 101   Hemoglobin A1c <5.7 % of total Hgb 6.3(A) 6.9(H) - - 6.6(H)   PHART 7.350 - 7.450 - 7.432 - - -   PCO2ART 32.0 - 48.0 mmHg - 39.4 - - -   HCO3 20.0 - 28.0 mmol/L - 25.8 - 28.5(H) -   TCO2 22 - 32 mmol/L - - - - -   ACIDBASEDEF 0.0 - 2.0 mmol/L - - - - -   O2SAT % - 95.4 - 67.3 -        Pulmonary Assessment Scores:  Pulmonary Assessment Scores     Row Name 08/17/21 1607         ADL UCSD   SOB Score total 79     Rest 4     Walk 5     Stairs 5     Bath 4     Dress 4     Shop 4       CAT Score   CAT Score 26       mMRC Score   mMRC Score 3              UCSD: Self-administered rating of dyspnea associated with activities of daily living (ADLs) 6-point scale (0 = "not at all" to 5 = "maximal or unable to do because of breathlessness")  Scoring Scores range from 0 to 120.  Minimally important difference is 5 units  CAT: CAT can identify the health impairment of COPD patients and is better correlated with disease progression.  CAT has a scoring range of zero to 40. The CAT score is classified into four groups of low (less than 10), medium (10 - 20), high (21-30) and very high (31-40) based on the impact level of disease on health status. A CAT score over 10 suggests significant symptoms.  A worsening CAT score could be explained by an exacerbation, poor medication adherence, poor inhaler technique, or progression of COPD or comorbid conditions.  CAT MCID is 2 points  mMRC: mMRC (Modified Medical Research Council) Dyspnea Scale is used to assess the degree of baseline functional disability in patients of respiratory disease due to dyspnea. No minimal important difference is established. A decrease in score of 1 point or greater is  considered a positive change.   Pulmonary Function Assessment:   Exercise Target Goals: Exercise Program Goal: Individual exercise prescription set using results from initial 6 min walk  test and THRR while considering  patients activity barriers and safety.   Exercise Prescription Goal: Initial exercise prescription builds to 30-45 minutes a day of aerobic activity, 2-3 days per week.  Home exercise guidelines will be given to patient during program as part of exercise prescription that the participant will acknowledge.  Education: Aerobic Exercise: - Group verbal and visual presentation on the components of exercise prescription. Introduces F.I.T.T principle from ACSM for exercise prescriptions.  Reviews F.I.T.T. principles of aerobic exercise including progression. Written material given at graduation. Flowsheet Row Pulmonary Rehab from 10/21/2021 in [facility] Cardiac and Pulmonary Rehab  Date 10/01/21  Educator [facility]  Instruction Review Code 1- Verbalizes Understanding       Education: Resistance Exercise: - Group verbal and visual presentation on the components of exercise prescription. Introduces F.I.T.T principle from ACSM for exercise prescriptions  Reviews F.I.T.T. principles of resistance exercise including progression. Written material given at graduation.    Education: Exercise & Equipment Safety: - Individual verbal instruction and demonstration of equipment use and safety with use of the equipment. Flowsheet Row Pulmonary Rehab from 10/21/2021 in [facility] Cardiac and Pulmonary Rehab  Date 08/17/21  Educator [facility]  Instruction Review Code 1- Verbalizes Understanding       Education: Exercise Physiology & General Exercise Guidelines: - Group verbal and written instruction with models to review the exercise physiology of the cardiovascular system and associated critical values. Provides general exercise guidelines with specific guidelines to those with heart or lung disease.   Flowsheet Row Pulmonary Rehab from 10/21/2021 in [facility] Cardiac and Pulmonary Rehab  Education need identified 08/17/21       Education: Flexibility, Balance, Mind/Body Relaxation: - Group verbal and visual presentation with interactive activity on the components of exercise prescription. Introduces F.I.T.T principle from ACSM for exercise prescriptions. Reviews F.I.T.T. principles of flexibility and balance exercise training including progression. Also discusses the mind body connection.  Reviews various relaxation techniques to help reduce and manage stress (i.e. Deep breathing, progressive muscle relaxation, and visualization). Balance handout provided to take home. Written material given at graduation.   Activity Barriers & Risk Stratification:  Activity Barriers & Cardiac Risk Stratification - 07/31/21 1313       Activity Barriers & Cardiac Risk Stratification   Activity Barriers Joint Problems;Arthritis;Shortness of Breath;Balance Concerns    Comments Walker             6 Minute Walk:  6 Minute Walk     Row Name 08/17/21 1551         6 Minute Walk   Phase Initial     Distance 680 feet     Walk Time 6 minutes     # of Rest Breaks 0     MPH 1.29     METS 1.25     RPE 13     Perceived Dyspnea  2     VO2 Peak 4.4     Symptoms No     Resting HR 72 bpm     Resting BP 140/72     Resting Oxygen Saturation  95 %     Exercise Oxygen Saturation  during 6 min walk 93 %     Max Ex. HR 84 bpm     Max Ex. BP 162/80     2 Minute Post BP 138/80       Interval HR   1 Minute HR 85     2 Minute HR 84     3 Minute HR 84  4 Minute HR 80     5 Minute HR 70     6 Minute HR 83     2 Minute Post HR 74     Interval Heart Rate? Yes       Interval Oxygen   Interval Oxygen? Yes     Baseline Oxygen Saturation % 95 %     1 Minute Oxygen Saturation % 95 %     1 Minute Liters of Oxygen 0 L     2 Minute Oxygen Saturation % 93 %     2 Minute Liters of Oxygen 0 L     3 Minute  Oxygen Saturation % 95 %     3 Minute Liters of Oxygen 0 L     4 Minute Oxygen Saturation % 94 %     4 Minute Liters of Oxygen 0 L     5 Minute Oxygen Saturation % 96 %     5 Minute Liters of Oxygen 0 L     6 Minute Oxygen Saturation % 95 %     6 Minute Liters of Oxygen 0 L     2 Minute Post Oxygen Saturation % 95 %     2 Minute Post Liters of Oxygen 0 L             Oxygen Initial Assessment:  Oxygen Initial Assessment - 09/15/21 0925       Home Oxygen   Home Oxygen Device None    Sleep Oxygen Prescription Continuous    Home Exercise Oxygen Prescription None    Home Resting Oxygen Prescription None   uses 1 L prn   Compliance with Home Oxygen Use Yes      Intervention   Short Term Goals To learn and exhibit compliance with exercise, home and travel O2 prescription;To learn and understand importance of monitoring SPO2 with pulse oximeter and demonstrate accurate use of the pulse oximeter.;To learn and understand importance of maintaining oxygen saturations>88%;To learn and demonstrate proper pursed lip breathing techniques or other breathing techniques.     Long  Term Goals Verbalizes importance of monitoring SPO2 with pulse oximeter and return demonstration;Maintenance of O2 saturations>88%;Exhibits proper breathing techniques, such as pursed lip breathing or other method taught during program session;Compliance with respiratory medication             Oxygen Re-Evaluation:  Oxygen Re-Evaluation     Row Name 09/01/21 0916 09/15/21 0925 10/15/21 1405         Program Oxygen Prescription   Program Oxygen Prescription -- -- None       Home Oxygen   Home Oxygen Device None -- Home Concentrator;E-Tanks     Sleep Oxygen Prescription Continuous -- Continuous     Liters per minute _0 Home Exercise Oxygen Prescription None -- None  PRN     Home Resting Oxygen Prescription None -- None  PRN     Compliance with Home Oxygen Use Yes -- Yes       Goals/Expected Outcomes    Short Term Goals To learn and exhibit compliance with exercise, home and travel O2 prescription;To learn and understand importance of monitoring SPO2 with pulse oximeter and demonstrate accurate use of the pulse oximeter.;To learn and understand importance of maintaining oxygen saturations>88%;To learn and demonstrate proper pursed lip breathing techniques or other breathing techniques.  -- To learn and understand importance of monitoring SPO2 with pulse oximeter and demonstrate accurate use of the pulse oximeter.;To learn  and understand importance of maintaining oxygen saturations>88%;To learn and exhibit compliance with exercise, home and travel O2 prescription     Long  Term Goals Verbalizes importance of monitoring SPO2 with pulse oximeter and return demonstration;Maintenance of O2 saturations>88%;Exhibits proper breathing techniques, such as pursed lip breathing or other method taught during program session;Compliance with respiratory medication -- Maintenance of O2 saturations>88%;Verbalizes importance of monitoring SPO2 with pulse oximeter and return demonstration;Exhibits compliance with exercise, home  and travel O2 prescription     Comments Reviewed PLB technique with pt.  Talked about how it works and it's importance in maintaining their exercise saturations. We reiterated PLB technique and patient is still checking his oxygen at home. He does report that he uses resting oxygen as needed and is aware to stay above that 88%. He is staying compliant using his oxygen at sleep and wants to talk to his doctor to see if he can increase his liter flow as he think he will feel better doing so. Patient has had a long history of cancer. He has had a left lower lobectomy. Informed him that he should be 2 percent and above with his oxygen. He states that he wears oxygen at home PRN. Informed him that he should watch his oxygen at home more on exertion. He verbalizes understaning of checking oxygen.      Goals/Expected Outcomes Short: Become more profiecient at using PLB.   Long: Become independent at using PLB. Short: Stay above 88% for oxygen and stay compliant to doctor's orders Long: Stay independent at PLB Short: check oxygen at home more routinely. Long: maintain oxygen saturation at home independenty.              Oxygen Discharge (Final Oxygen Re-Evaluation):  Oxygen Re-Evaluation - 10/15/21 1405       Program Oxygen Prescription   Program Oxygen Prescription None      Home Oxygen   Home Oxygen Device Home Concentrator;E-Tanks    Sleep Oxygen Prescription Continuous    Liters per minute 2    Home Exercise Oxygen Prescription None   PRN   Home Resting Oxygen Prescription None   PRN   Compliance with Home Oxygen Use Yes      Goals/Expected Outcomes   Short Term Goals To learn and understand importance of monitoring SPO2 with pulse oximeter and demonstrate accurate use of the pulse oximeter.;To learn and understand importance of maintaining oxygen saturations>88%;To learn and exhibit compliance with exercise, home and travel O2 prescription    Long  Term Goals Maintenance of O2 saturations>88%;Verbalizes importance of monitoring SPO2 with pulse oximeter and return demonstration;Exhibits compliance with exercise, home  and travel O2 prescription    Comments Patient has had a long history of cancer. He has had a left lower lobectomy. Informed him that he should be 25 percent and above with his oxygen. He states that he wears oxygen at home PRN. Informed him that he should watch his oxygen at home more on exertion. He verbalizes understaning of checking oxygen.    Goals/Expected Outcomes Short: check oxygen at home more routinely. Long: maintain oxygen saturation at home independenty.             Initial Exercise Prescription:  Initial Exercise Prescription - 08/17/21 1500       Date of Initial Exercise RX and Referring Provider   Date 08/17/21    Referring Provider Dr.  Patsey Berthold      Oxygen   Maintain Oxygen Saturation 88% or higher  NuStep   Level 1    SPM 80    Minutes 15    METs 1.25      Recumbant Elliptical   Level 1    RPM 50    Minutes 15    METs 1.25      Track   Laps 16    Minutes 15    METs 1.25      Prescription Details   Frequency (times per week) 2    Duration Progress to 30 minutes of continuous aerobic without signs/symptoms of physical distress      Intensity   THRR 40-80% of Max Heartrate 100-129    Ratings of Perceived Exertion 11-13    Perceived Dyspnea 0-4      Progression   Progression Continue to progress workloads to maintain intensity without signs/symptoms of physical distress.      Resistance Training   Training Prescription Yes    Weight 2    Reps 10-15             Perform Capillary Blood Glucose checks as needed.  Exercise Prescription Changes:   Exercise Prescription Changes     Row Name 08/17/21 1500 09/15/21 1200 10/26/21 1200         Response to Exercise   Blood Pressure (Admit) 140/72 106/62 144/80     Blood Pressure (Exercise) 162/80 140/64 162/86     Blood Pressure (Exit) 138/80 126/62 138/78     Heart Rate (Admit) 72 bpm 64 bpm 80 bpm     Heart Rate (Exercise) 84 bpm 75 bpm 99 bpm     Heart Rate (Exit) 74 bpm 70 bpm 88 bpm     Oxygen Saturation (Admit) 95 % 95 % 96 %     Oxygen Saturation (Exercise) 93 % 93 % 94 %     Oxygen Saturation (Exit) 95 % 91 % 95 %     Rating of Perceived Exertion (Exercise) _0 Perceived Dyspnea (Exercise) _1 Symptoms none none SOB     Comments 6 MWT results third day --     Duration -- Progress to 30 minutes of  aerobic without signs/symptoms of physical distress Progress to 30 minutes of  aerobic without signs/symptoms of physical distress     Intensity -- THRR unchanged THRR unchanged       Progression   Progression -- Continue to progress workloads to maintain intensity without signs/symptoms of physical distress. Continue  to progress workloads to maintain intensity without signs/symptoms of physical distress.     Average METs -- 1.38 2.2       Resistance Training   Training Prescription -- Yes Yes     Weight -- 2 lb 2 lb     Reps -- 10-15 10-15       Interval Training   Interval Training -- -- No       NuStep   Level -- -- 3     Minutes -- -- 15     METs -- -- 2.3       Arm Ergometer   Level -- -- 1     Minutes -- -- 30     METs -- -- 2.3       Track   Laps -- -- 10     Minutes -- -- 10     METs -- -- 1.54       Oxygen   Maintain Oxygen Saturation -- --  88% or higher              Exercise Comments:   Exercise Comments     Row Name 09/01/21 0915           Exercise Comments First full day of exercise!  Patient was oriented to gym and equipment including functions, settings, policies, and procedures.  Patient's individual exercise prescription and treatment plan were reviewed.  All starting workloads were established based on the results of the 6 minute walk test done at initial orientation visit.  The plan for exercise progression was also introduced and progression will be customized based on patient's performance and goals.                Exercise Goals and Review:   Exercise Goals     Row Name 08/17/21 1559             Exercise Goals   Increase Physical Activity Yes       Intervention Provide advice, education, support and counseling about physical activity/exercise needs.;Develop an individualized exercise prescription for aerobic and resistive training based on initial evaluation findings, risk stratification, comorbidities and participant's personal goals.       Expected Outcomes Short Term: Attend rehab on a regular basis to increase amount of physical activity.;Long Term: Add in home exercise to make exercise part of routine and to increase amount of physical activity.;Long Term: Exercising regularly at least 3-5 days a week.       Increase Strength and Stamina  Yes       Intervention Provide advice, education, support and counseling about physical activity/exercise needs.;Develop an individualized exercise prescription for aerobic and resistive training based on initial evaluation findings, risk stratification, comorbidities and participant's personal goals.       Expected Outcomes Short Term: Increase workloads from initial exercise prescription for resistance, speed, and METs.;Short Term: Perform resistance training exercises routinely during rehab and add in resistance training at home;Long Term: Improve cardiorespiratory fitness, muscular endurance and strength as measured by increased METs and functional capacity (6MWT)       Able to understand and use rate of perceived exertion (RPE) scale Yes       Intervention Provide education and explanation on how to use RPE scale       Expected Outcomes Short Term: Able to use RPE daily in rehab to express subjective intensity level;Long Term:  Able to use RPE to guide intensity level when exercising independently       Able to understand and use Dyspnea scale Yes       Intervention Provide education and explanation on how to use Dyspnea scale       Expected Outcomes Short Term: Able to use Dyspnea scale daily in rehab to express subjective sense of shortness of breath during exertion;Long Term: Able to use Dyspnea scale to guide intensity level when exercising independently       Knowledge and understanding of Target Heart Rate Range (THRR) Yes       Intervention Provide education and explanation of THRR including how the numbers were predicted and where they are located for reference       Expected Outcomes Short Term: Able to state/look up THRR;Long Term: Able to use THRR to govern intensity when exercising independently;Short Term: Able to use daily as guideline for intensity in rehab       Able to check pulse independently Yes       Intervention Provide education and demonstration on how to  check pulse in  carotid and radial arteries.;Review the importance of being able to check your own pulse for safety during independent exercise       Expected Outcomes Short Term: Able to explain why pulse checking is important during independent exercise;Long Term: Able to check pulse independently and accurately       Understanding of Exercise Prescription Yes       Intervention Provide education, explanation, and written materials on patient's individual exercise prescription       Expected Outcomes Short Term: Able to explain program exercise prescription;Long Term: Able to explain home exercise prescription to exercise independently                Exercise Goals Re-Evaluation :  Exercise Goals Re-Evaluation     Row Name 09/01/21 0915 09/15/21 0923 10/26/21 1233         Exercise Goal Re-Evaluation   Exercise Goals Review Increase Physical Activity;Able to understand and use rate of perceived exertion (RPE) scale;Knowledge and understanding of Target Heart Rate Range (THRR);Understanding of Exercise Prescription;Able to understand and use Dyspnea scale;Able to check pulse independently;Increase Strength and Stamina Increase Strength and Stamina;Increase Physical Activity Increase Strength and Stamina;Increase Physical Activity     Comments Reviewed RPE and dyspnea scales, THR and program prescription with pt today.  Pt voiced understanding and was given a copy of goals to take home. Aragorn is only on session 5 and is not ready to go over home exercise yet. He is walking a little bit at home and I encouraged him to think about what kind of home exercise he will be comfortable doing. We talked about the [facility] which he will think about. Jalon is doing well in rehab. He enjoys the arm crank and tolerated it well for the full 30 minutes last session.  He was also able to walk 10 laps on the track and increased to level 3 on the T4 Nustep. Unforuntately, he just found out he tested positive for covid and will  have to be out for another 2 weeks. Staff will encourage to pick up where he left off.     Expected Outcomes Short: Use RPE daily to regulate intensity. Long: Follow program prescription in THR. Short: Continue walking home and think about home exercise Long: Exercise independently at home at appropriate prescription Short: Continue working up loads as tolerated and increase laps on track Long: Increase overall MET level              Discharge Exercise Prescription (Final Exercise Prescription Changes):  Exercise Prescription Changes - 10/26/21 1200       Response to Exercise   Blood Pressure (Admit) 144/80    Blood Pressure (Exercise) 162/86    Blood Pressure (Exit) 138/78    Heart Rate (Admit) 80 bpm    Heart Rate (Exercise) 99 bpm    Heart Rate (Exit) 88 bpm    Oxygen Saturation (Admit) 96 %    Oxygen Saturation (Exercise) 94 %    Oxygen Saturation (Exit) 95 %    Rating of Perceived Exertion (Exercise) 16    Perceived Dyspnea (Exercise) 3    Symptoms SOB    Duration Progress to 30 minutes of  aerobic without signs/symptoms of physical distress    Intensity THRR unchanged      Progression   Progression Continue to progress workloads to maintain intensity without signs/symptoms of physical distress.    Average METs 2.2      Resistance Training   Training  Prescription Yes    Weight 2 lb    Reps 10-15      Interval Training   Interval Training No      NuStep   Level 3    Minutes 15    METs 2.3      Arm Ergometer   Level 1    Minutes 30    METs 2.3      Track   Laps 10    Minutes 10    METs 1.54      Oxygen   Maintain Oxygen Saturation 88% or higher             Nutrition:  Target Goals: Understanding of nutrition guidelines, daily intake of sodium <1576m, cholesterol <2058m calories 30% from fat and 7% or less from saturated fats, daily to have 5 or more servings of fruits and vegetables.  Education: All About Nutrition: -Group instruction provided  by verbal, written material, interactive activities, discussions, models, and posters to present general guidelines for heart healthy nutrition including fat, fiber, MyPlate, the role of sodium in heart healthy nutrition, utilization of the nutrition label, and utilization of this knowledge for meal planning. Follow up email sent as well. Written material given at graduation.   Biometrics:  Pre Biometrics - 08/17/21 1600       Pre Biometrics   Height 5' 9.5" (1.765 m)    Weight 208 lb 4.8 oz (94.5 kg)    BMI (Calculated) 30.33    Single Leg Stand 0 seconds              Nutrition Therapy Plan and Nutrition Goals:  Nutrition Therapy & Goals - 09/15/21 1019       Nutrition Therapy   Diet Heart healthy, low Na, T2DM MNT, pulmonary MNT.    Protein (specify units) 75g    Fiber 30 grams    Whole Grain Foods 3 servings    Saturated Fats 12 max. grams    Fruits and Vegetables 8 servings/day    Sodium 1.5 grams      Personal Nutrition Goals   Nutrition Goal ST: track indigestion with last time eating, bedtime, and trigger foods LT: prevent indigestion    Comments [age].o. M admitted to rehab for COPD presents with HTN, CHF, GERD, T2DM, HLD, iron deficiency anemia. PMH of . Relevant medications include calcium/vit D, coreg, zeita, lantus, metformin, protonix, K+, torsemide. In[employer]ird[employer] days a week for lunch; spaghetti and meatballs, black eyed peas, peas, sliced peaches and jello. He makes his meals for the weekend - chili, shrimp, ham, chicken, tu[place]uwaitried rice. Most days he doesn't feel like eating much so he sticks with some fruit and peanut butter. He reports that he has gas with eating - he feels coke helps with his indigestion. He gets indigestion close to bedtime.  The only vegetable he won't eat is spinach. He reports that lunch is filling and he will have fruit (variety) and some peanut butter crackers. He drinks water mostly. He does not have  teeth so he has difficulty eating hard things. He takes his medicine at 830/9am - with a cracker or 2. he takes 20 units of Lantus at night - he checks his BG in am and pm (he reports is 90-200 depending on the day). He tries to eat foods that are sugar free aside from some peanut butter crackers. 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 Reviewed  some key MNT for diabetes and pulmonary nutrition. Will review education as he goes through the program.      Intervention Plan   Intervention Prescribe, educate and counsel regarding individualized specific dietary modifications aiming towards targeted core components such as weight, hypertension, lipid management, diabetes, heart failure and other comorbidities.    Expected Outcomes Short Term Goal: Understand basic principles of dietary content, such as calories, fat, sodium, cholesterol and nutrients.;Short Term Goal: A plan has been developed with personal nutrition goals set during dietitian appointment.;Long Term Goal: Adherence to prescribed nutrition plan.             Nutrition Assessments:  MEDIFICTS Score Key: ?70 Need to make dietary changes  40-70 Heart Healthy Diet ? 40 Therapeutic Level Cholesterol Diet  Flowsheet Row Pulmonary Rehab from 08/17/2021 in [facility] Cardiac and Pulmonary Rehab  Picture Your Plate Total Score on Admission 63      Picture Your Plate Scores: <06 Unhealthy dietary pattern with much room for improvement. 41-50 Dietary pattern unlikely to meet recommendations for good health and room for improvement. 51-60 More healthful dietary pattern, with some room for improvement.  >60 Healthy dietary pattern, although there may be some specific behaviors that could be improved.   Nutrition Goals Re-Evaluation:  Nutrition Goals Re-Evaluation     [place] Name 10/15/21 1421             Goals   Nutrition Goal Do smaller snacks throughout the day.       Comment Patient was informed  on why it is important to maintain a balanced diet when dealing with Respiratory issues. Explained that it takes a lot of energy to breath and when they are short of breath often they will need to have a good diet to help keep up with the calories they are expending for breathing.       Expected Outcome Short: Choose and plan snacks accordingly to patients caloric intake to improve breathing. Long: Maintain a diet independently that meets their caloric intake to aid in daily shortness of breath.                Nutrition Goals Discharge (Final Nutrition Goals Re-Evaluation):  Nutrition Goals Re-Evaluation - 10/15/21 1421       Goals   Nutrition Goal Do smaller snacks throughout the day.    Comment Patient was informed on why it is important to maintain a balanced diet when dealing with Respiratory issues. Explained that it takes a lot of energy to breath and when they are short of breath often they will need to have a good diet to help keep up with the calories they are expending for breathing.    Expected Outcome Short: Choose and plan snacks accordingly to patients caloric intake to improve breathing. Long: Maintain a diet independently that meets their caloric intake to aid in daily shortness of breath.             Psychosocial: Target Goals: Acknowledge presence or absence of significant depression and/or stress, maximize coping skills, provide positive support system. Participant is able to verbalize types and ability to use techniques and skills needed for reducing stress and depression.   Education: Stress, Anxiety, and Depression - Group verbal and visual presentation to define topics covered.  Reviews how body is impacted by stress, anxiety, and depression.  Also discusses healthy ways to reduce stress and to treat/manage anxiety and depression.  Written material given at graduation.   Education: Sleep Hygiene -Provides group  verbal and written instruction about how sleep can  affect your health.  Define sleep hygiene, discuss sleep cycles and impact of sleep habits. Review good sleep hygiene tips.    Initial Review & Psychosocial Screening:  Initial Psych Review & Screening - 07/31/21 1328       Initial Review   Current issues with Current Stress Concerns    Source of Stress Concerns Family;Transportation      Family Dynamics   Good Support System? Yes   friend   Concerns Inappropriate over/under dependence on family/friends      Barriers   Psychosocial barriers to participate in program There are no identifiable barriers or psychosocial needs.;The patient should benefit from training in stress management and relaxation.      Screening Interventions   Interventions Encouraged to exercise;Provide feedback about the scores to participant;To provide support and resources with identified psychosocial needs    Expected Outcomes Short Term goal: Utilizing psychosocial counselor, staff and physician to assist with identification of specific Stressors or current issues interfering with healing process. Setting desired goal for each stressor or current issue identified.;Long Term Goal: Stressors or current issues are controlled or eliminated.;Short Term goal: Identification and review with participant of any Quality of Life or Depression concerns found by scoring the questionnaire.;Long Term goal: The participant improves quality of Life and PHQ9 Scores as seen by post scores and/or verbalization of changes             Quality of Life Scores:  Scores of 19 and below usually indicate a poorer quality of life in these areas.  A difference of  2-3 points is a clinically meaningful difference.  A difference of 2-3 points in the total score of the Quality of Life Index has been associated with significant improvement in overall quality of life, self-image, physical symptoms, and general health in studies assessing change in quality of life.  PHQ-9: Recent Review  Flowsheet Data     Depression screen [facility] 2/9 09/22/2021 08/17/2021 05/18/2021 12/09/2020 03/31/2020   Decreased Interest 0 0 3 0 0   Down, Depressed, Hopeless 0 3 0 0 0   PHQ - 2 Score 0 3 3 0 0   Altered sleeping _0 - -   Tired, decreased energy _1 - -   Change in appetite 0 3 3 - -   Feeling bad or failure about yourself  0 3 0 - -   Trouble concentrating 0 0 0 - -   Moving slowly or fidgety/restless 0 0 0 - -   Suicidal thoughts 0 0 3 - -   PHQ-9 Score _2 - -   Difficult doing work/chores Somewhat difficult Somewhat difficult Not difficult at all - -      Interpretation of Total Score  Total Score Depression Severity:  1-4 = Minimal depression, 5-9 = Mild depression, 10-14 = Moderate depression, 15-19 = Moderately severe depression, 20-27 = Severe depression   Psychosocial Evaluation and Intervention:  Psychosocial Evaluation - 07/31/21 1338       Psychosocial Evaluation & Interventions   Interventions Encouraged to exercise with the program and follow exercise prescription;Stress management education    Comments Lowery is coming to pulmonary rehab for COPD. He has been placed on O2 at night and sometimes will put himself on it during the day if he is feeling sluggish. He does report some stress concerns related to transportation and family. His friend who used to drive him places is  also dealing with health issues so Galdino has been using [employer] more. Regarding family, he states he has been having flashback dreams about his wife who died 8 years ago. This disrupts his sleep which is already struggling since he only gets about 3-4 hours of sleep a night (has been his norm since high school). His other family stressor is his daughter who he states he doesn't want to claim her as such after the way she has treated him, including selling some of his checks which he turned her in to the government for. She is local, but he hasnt seen her in 4 years. He lives in [place] where he  enjoys the dance and yoga classes they offer. 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 He is hopeful this program will boost his stamina and improve his breathing.    Expected Outcomes Short; attend pulmonary rehab for education and exercise. Long: develop and maintain positive self care habits    Continue Psychosocial Services  Follow up required by staff             Psychosocial Re-Evaluation:  Psychosocial Re-Evaluation     [facility] Name 09/15/21 0935 09/22/21 0934           Psychosocial Re-Evaluation   Current issues with Current Sleep Concerns Current Stress Concerns      Comments Anas is doing well mentally. He denies any depression or anxiety . 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 He only sleeps 4 hours per night and takes naps throughout the day. He doesn't have any interest in taking any medicine but he is interested in increasing his liter flow for his oxygen during sleep. He knows to stay above 88%. I encouraged him to call his doctor today to discuss what changes they can make and/or recommend to make his sleep a little better. Reviewed patient health questionnaire (PHQ-9) with patient for follow up. Previously, patients score indicated signs/symptoms of depression.  Reviewed to see if patient is improving symptom wise while in program.  Score improved and patient states that it is because they have been coming to rehab and he has more energy again.      Expected Outcomes Short: Talk to doctor about sleep Long: Continue to utilize exercise for stress management and maintain positive attitude Short: Continue to attend [facility] regularly for regular exercise and social engagement. Long: Continue to improve symptoms and manage a positive mental state.      Interventions Encouraged to attend Pulmonary Rehabilitation for the  exercise Encouraged to attend Pulmonary Rehabilitation for the exercise      Continue Psychosocial Services  Follow up required by staff Follow up required by staff               Psychosocial Discharge (Final Psychosocial Re-Evaluation):  Psychosocial Re-Evaluation - 09/22/21 0934       Psychosocial Re-Evaluation   Current issues with Current Stress Concerns    Comments Reviewed patient health questionnaire (PHQ-9) with patient for follow up. Previously, patients score indicated signs/symptoms of depression.  Reviewed to see if patient is improving symptom wise while in program.  Score improved and patient states that it is because they have been coming to rehab and he has more energy again.    Expected Outcomes Short: Continue to attend [facility] regularly for regular exercise and social engagement. Long:  Continue to improve symptoms and manage a positive mental state.    Interventions Encouraged to attend Pulmonary Rehabilitation for the exercise    Continue Psychosocial Services  Follow up required by staff             Education: Education Goals: Education classes will be provided on a weekly basis, covering required topics. Participant will state understanding/return demonstration of topics presented.  Learning Barriers/Preferences:  Learning Barriers/Preferences - 07/31/21 1327       Learning Barriers/Preferences   Learning Barriers None    Learning Preferences Individual Instruction             General Pulmonary Education Topics:  Infection Prevention: - Provides verbal and written material to individual with discussion of infection control including proper hand washing and proper equipment cleaning during exercise session. Flowsheet Row Pulmonary Rehab from 10/21/2021 in [facility] Cardiac and Pulmonary Rehab  Date 08/17/21  Educator [facility]  Instruction Review Code 1- Verbalizes Understanding       Falls Prevention: - Provides verbal and written material to  individual with discussion of falls prevention and safety. Flowsheet Row Pulmonary Rehab from 10/21/2021 in [facility] Cardiac and Pulmonary Rehab  Date 08/17/21  Educator [facility]  Instruction Review Code 1- Verbalizes Understanding       Chronic Lung Disease Review: - Group verbal instruction with posters, models, PowerPoint presentations and videos,  to review new updates, new respiratory medications, new advancements in procedures and treatments. Providing information on websites and "800" numbers for continued self-education. Includes information about supplement oxygen, available portable oxygen systems, continuous and intermittent flow rates, oxygen safety, concentrators, and Medicare reimbursement for oxygen. Explanation of Pulmonary Drugs, including class, frequency, complications, importance of spacers, rinsing mouth after steroid MDI's, and proper cleaning methods for nebulizers. Review of basic lung anatomy and physiology related to function, structure, and complications of lung disease. Review of risk factors. Discussion about methods for diagnosing sleep apnea and types of masks and machines for OSA. Includes a review of the use of types of environmental controls: home humidity, furnaces, filters, dust mite/pet prevention, HEPA vacuums. Discussion about weather changes, air quality and the benefits of nasal washing. Instruction on Warning signs, infection symptoms, calling MD promptly, preventive modes, and value of vaccinations. Review of effective airway clearance, coughing and/or vibration techniques. Emphasizing that all should Create an Action Plan. Written material given at graduation. Flowsheet Row Pulmonary Rehab from 10/21/2021 in [facility] Cardiac and Pulmonary Rehab  Education need identified 08/17/21  Date 09/10/21  Educator [facility]  Instruction Review Code 1- Verbalizes Understanding       AED/CPR: - Group verbal and written instruction with the use of models to demonstrate the basic use of  the AED with the basic ABC's of resuscitation.    Anatomy and Cardiac Procedures: - Group verbal and visual presentation and models provide information about basic cardiac anatomy and function. Reviews the testing methods done to diagnose heart disease and the outcomes of the test results. Describes the treatment choices: Medical Management, Angioplasty, or Coronary Bypass Surgery for treating various heart conditions including Myocardial Infarction, Angina, Valve Disease, and Cardiac Arrhythmias.  Written material given at graduation.   Medication Safety: - Group verbal and visual instruction to review commonly prescribed medications for heart and lung disease. Reviews the medication, class of the drug, and side effects. Includes the steps to properly store meds and maintain the prescription regimen.  Written material given at graduation. Flowsheet Row Pulmonary Rehab from 10/21/2021 in [facility] Cardiac and Pulmonary  Rehab  Date 10/21/21  Educator [place]  Instruction Review Code 1- Verbalizes Understanding       Other: -Provides group and verbal instruction on various topics (see comments)   Knowledge Questionnaire Score:  Knowledge Questionnaire Score - 08/17/21 1606       Knowledge Questionnaire Score   Pre Score 9/18              Core Components/Risk Factors/Patient Goals at Admission:  Personal Goals and Risk Factors at Admission - 08/17/21 1602       Core Components/Risk Factors/Patient Goals on Admission    Weight Management Yes    Intervention Weight Management: Develop a combined nutrition and exercise program designed to reach desired caloric intake, while maintaining appropriate intake of nutrient and fiber, sodium and fats, and appropriate energy expenditure required for the weight goal.;Weight Management: Provide education and appropriate resources to help participant work on and attain dietary goals.;Weight Management/Obesity: Establish reasonable short term and long  term weight goals.;Obesity: Provide education and appropriate resources to help participant work on and attain dietary goals.    Admit Weight 208 lb 4.8 oz (94.5 kg)    Goal Weight: Short Term 203 lb (92.1 kg)    Goal Weight: Long Term 198 lb (89.8 kg)    Expected Outcomes Short Term: Continue to assess and modify interventions until short term weight is achieved;Long Term: Adherence to nutrition and physical activity/exercise program aimed toward attainment of established weight goal;Understanding recommendations for meals to include 15-35% energy as protein, 25-35% energy from fat, 35-60% energy from carbohydrates, less than 281m of dietary cholesterol, 20-35 gm of total fiber daily;Understanding of distribution of calorie intake throughout the day with the consumption of 4-5 meals/snacks;Weight Loss: Understanding of general recommendations for a balanced deficit meal plan, which promotes 1-2 lb weight loss per week and includes a negative energy balance of [phone] kcal/d    Tobacco Cessation Yes    Number of packs per day 2-3 cigs a day    Intervention Assist the participant in steps to quit. Provide individualized education and counseling about committing to Tobacco Cessation, relapse prevention, and pharmacological support that can be provided by physician.;OAdvice worker assist with locating and accessing local/national Quit Smoking programs, and support quit date choice.    Expected Outcomes Short Term: Will demonstrate readiness to quit, by selecting a quit date.;Short Term: Will quit all tobacco product use, adhering to prevention of relapse plan.;Long Term: Complete abstinence from all tobacco products for at least 12 months from quit date.    Improve shortness of breath with ADL's Yes    Intervention Provide education, individualized exercise plan and daily activity instruction to help decrease symptoms of SOB with activities of daily living.    Expected Outcomes Short Term:  Improve cardiorespiratory fitness to achieve a reduction of symptoms when performing ADLs;Long Term: Be able to perform more ADLs without symptoms or delay the onset of symptoms    Diabetes Yes    Intervention Provide education about signs/symptoms and action to take for hypo/hyperglycemia.;Provide education about proper nutrition, including hydration, and aerobic/resistive exercise prescription along with prescribed medications to achieve blood glucose in normal ranges: Fasting glucose 65-99 mg/dL    Expected Outcomes Short Term: Participant verbalizes understanding of the signs/symptoms and immediate care of hyper/hypoglycemia, proper foot care and importance of medication, aerobic/resistive exercise and nutrition plan for blood glucose control.;Long Term: Attainment of HbA1C < 7%.    Hypertension Yes    Intervention Provide education on lifestyle modifcations  including regular physical activity/exercise, weight management, moderate sodium restriction and increased consumption of fresh fruit, vegetables, and low fat dairy, alcohol moderation, and smoking cessation.;Monitor prescription use compliance.    Expected Outcomes Short Term: Continued assessment and intervention until BP is < 140/60m HG in hypertensive participants. < 130/884mHG in hypertensive participants with diabetes, heart failure or chronic kidney disease.;Long Term: Maintenance of blood pressure at goal levels.             Education:Diabetes - Individual verbal and written instruction to review signs/symptoms of diabetes, desired ranges of glucose level fasting, after meals and with exercise. Acknowledge that pre and post exercise glucose checks will be done for 3 sessions at entry of program. [facility]  Date 07/31/21  [facility] Code 1- Verbalizes Understanding       Know Your Numbers and Heart Failure: - Group verbal and visual  instruction to discuss disease risk factors for cardiac and pulmonary disease and treatment options.  Reviews associated critical values for Overweight/Obesity, Hypertension, Cholesterol, and Diabetes.  Discusses basics of heart failure: signs/symptoms and treatments.  Introduces Heart Failure Zone chart for action plan for heart failure.  Written material given at graduation. Flow[facility]  Date 09/03/21  Educator [facility]  Instruction Review Code 1- Verbalizes Understanding       Core Components/Risk Factors/Patient Goals Review:   Goals and Risk Factor Review     Row Name 09/15/21 0930 10/15/21 1412           Core Components/Risk Factors/Patient Goals Review   Personal Goals Review Weight Management/Obesity;Tobacco Cessation;Diabetes;Improve shortness of breath with ADL's Improve shortness of breath with ADL's;Tobacco Cessation      Review HaAgamtopped smoking about 1 week ago and is proud if himself for maintaining it thus far. He isn't using any resources to help him quit, however, I did provide him a smoking resource packet to reference to in case he needs it. He is checking his blood sugars twice daily and does not report any abnormal numbers. Harvery does not weigh himself at home as he does not have a scale. His goal is 195 lb- he is meeting with the RD after his session and wants to focus on diet changes to help with weight loss. He is still new in the program and hopes this will help relieve any SOB. Spoke to patient about their shortness of breath and what they can do to improve. Patient has been informed of breathing techniques when starting the program. Patient is informed to tell staff if they have had any med changes and that certain meds they are taking or not taking can be causing shortness of breath. He has not had cigarette for 2 weeks. He plans to continue to not smoke.      Expected Outcomes Short: Continue  smoking cessation Long: Continue to manage lifestyle risk factors Short: Attend [facility] regularly to improve shortness of breath with ADLs. Long: maintain independence with ADLs               Core Components/Risk Factors/Patient Goals at Discharge (Final Review):   Goals and Risk Factor Review - 10/15/21 1412       Core Components/Risk Factors/Patient Goals Review   Personal Goals Review Improve shortness of breath with ADL's;Tobacco Cessation    Review Spoke to patient about their shortness of breath and what they  can do to improve. Patient has been informed of breathing techniques when starting the program. Patient is informed to tell staff if they have had any med changes and that certain meds they are taking or not taking can be causing shortness of breath. He has not had cigarette for 2 weeks. He plans to continue to not smoke.    Expected Outcomes Short: Attend [facility] regularly to improve shortness of breath with ADLs. Long: maintain independence with ADLs             ITP Comments:  ITP Comments     Row Name 07/31/21 1323 08/17/21 1548 09/01/21 0915 09/02/21 0625 09/15/21 1126   ITP Comments Initial telephone orientation completed. Diagnosis can be found in [facility] 9/23. EP orientation scheduled for Monday 11/14 at 1:30 Completed 6MWT and gym orientation. Initial ITP created and sent for review to Dr. Zetta Bills, Medical Director. First full day of exercise!  Patient was oriented to gym and equipment including functions, settings, policies, and procedures.  Patient's individual exercise prescription and treatment plan were reviewed.  All starting workloads were established based on the results of the 6 minute walk test done at initial orientation visit.  The plan for exercise progression was also introduced and progression will be customized based on patient's performance and goals. 30 Day review completed. Medical Director ITP review done, changes made as directed, and  signed approval by Medical Director.   New to program Completed initial RD evaluation    Row Name 09/30/21 0644 10/26/21 1224 10/28/21 0819       ITP Comments 30 Day review completed. Medical Director ITP review done, changes made as directed, and signed approval by Medical Director. Regan called to let us know that he will be out for the week as he tested positive for COVID on 10/23/21.  He can return to rehab on 11/04/21. 30 Day review completed. Medical Director ITP review done, changes made as directed, and signed approval by Medical Director.   Out for COVID              Comments:

## 2021-11-01 IMAGING — CR DG CHEST 2V
1 series · 2 of 2 positions shown · non-contrast
Comparison: 09/07/2018

CLINICAL DATA: Chest pain with nausea and vomiting.

EXAM:
CHEST - 2 VIEW

[Series 1: w chest pa · 0.14mm/px · 2 of 2 slices shown]
[im 1/2]
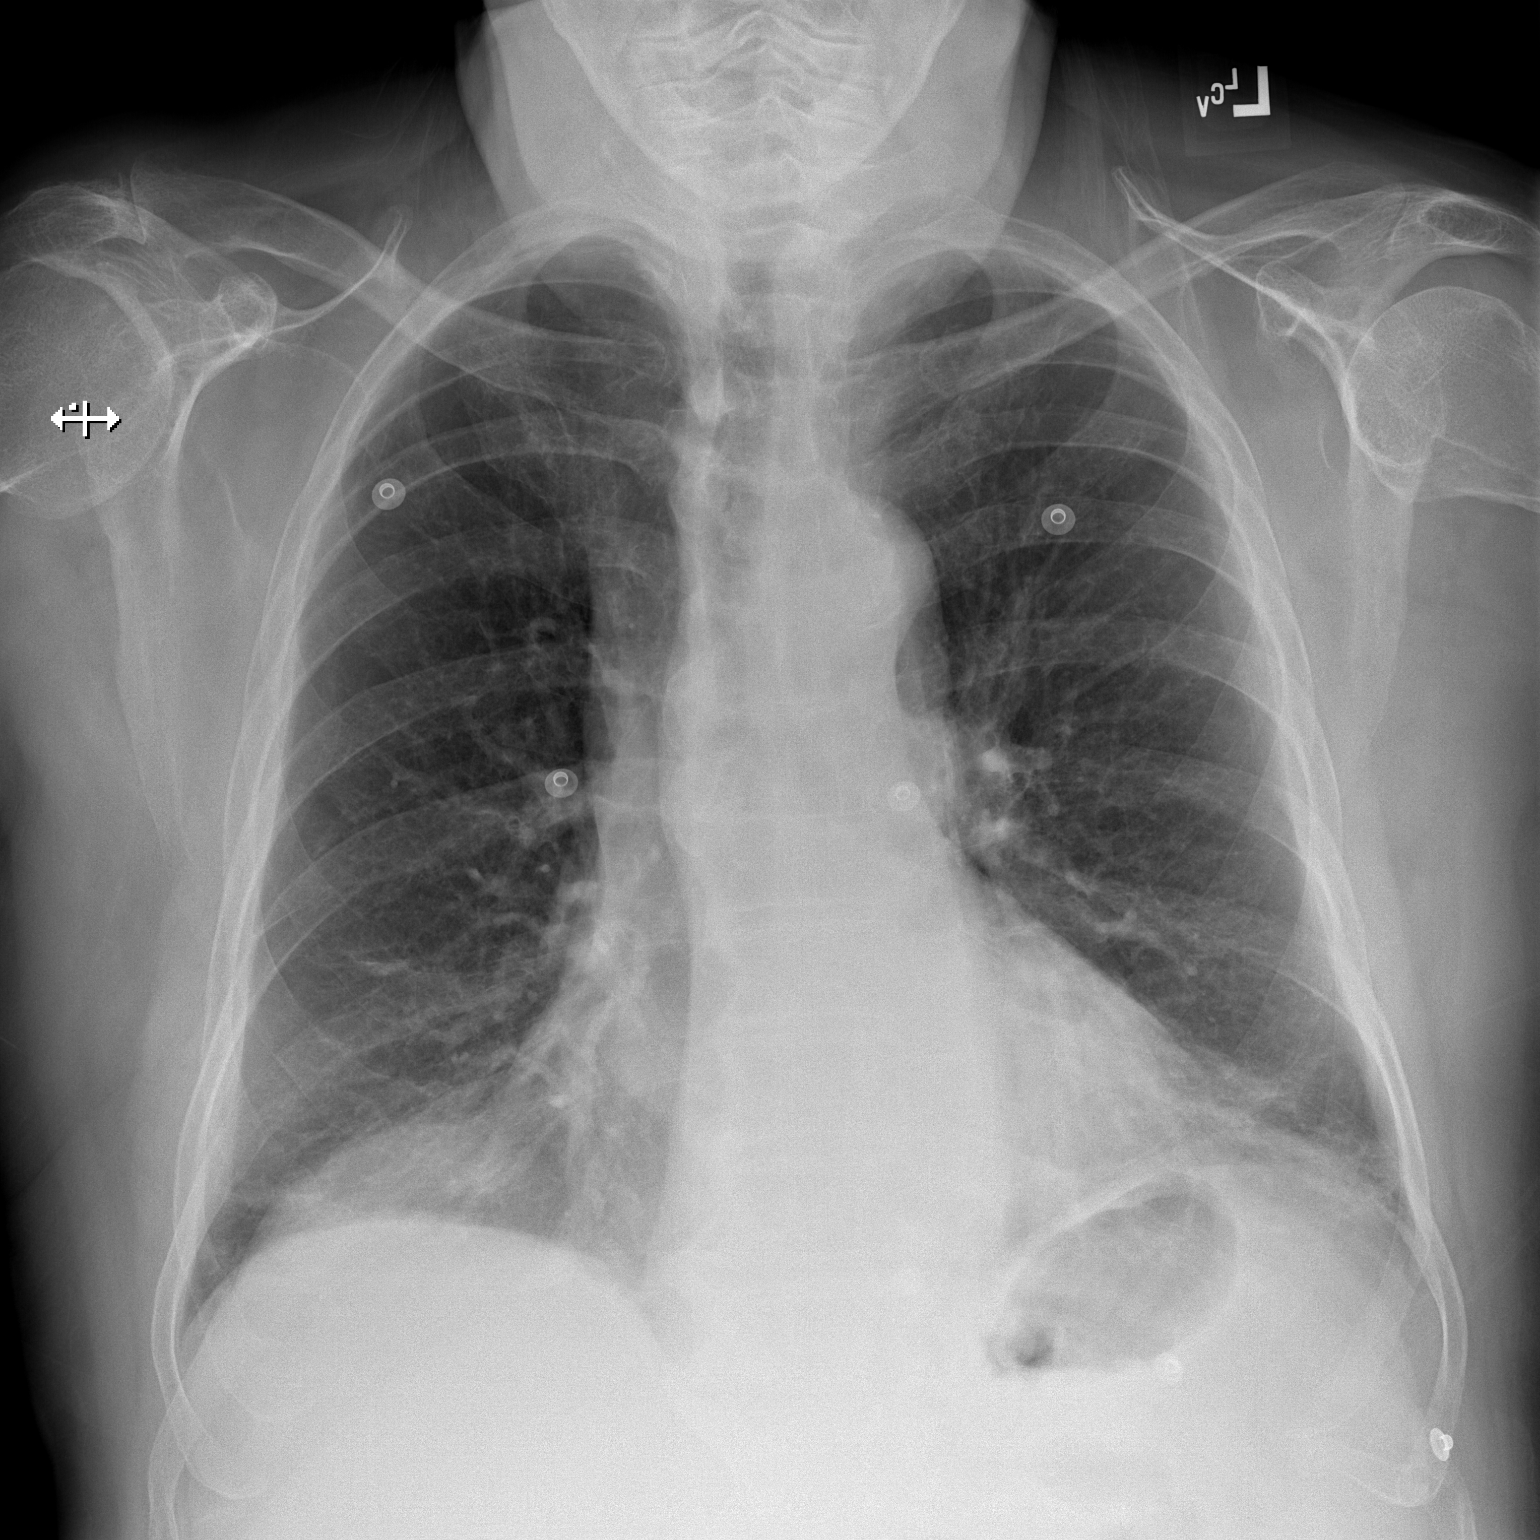
[im 2/2]
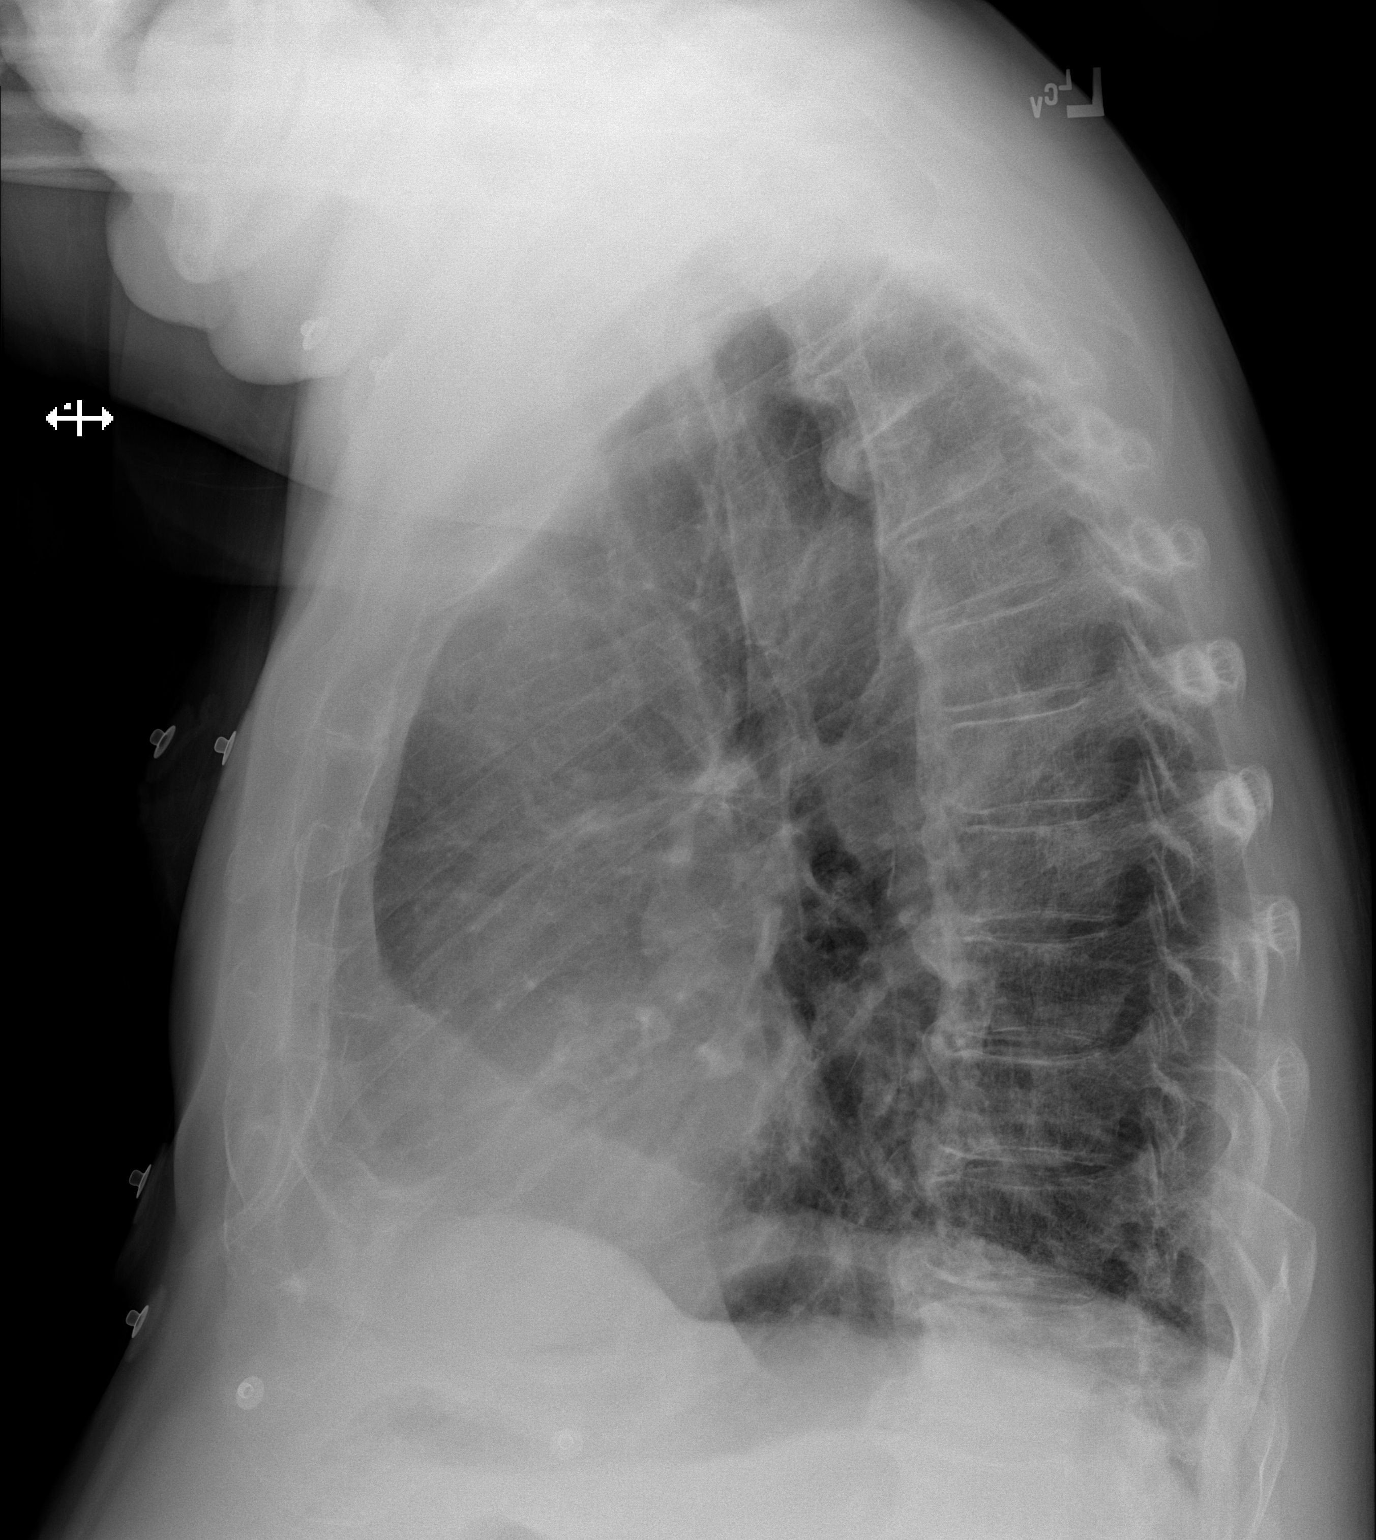

[2 of 2 positions shown; findings below may reference images not displayed]

FINDINGS: The cardiomediastinal silhouette is unchanged with normal heart
size. The lungs are borderline hyperinflated with unchanged mild
atelectasis or scarring in the left greater than right lung bases.
No pulmonary edema, pleural effusion, pneumothorax is identified. No
acute osseous abnormality is seen.
IMPRESSION: Bibasilar atelectasis or scarring. No evidence of active
cardiopulmonary disease.

## 2021-11-04 ENCOUNTER — Encounter: Payer: Medicare Other | Attending: Pulmonary Disease

## 2021-11-04 DIAGNOSIS — J449 Chronic obstructive pulmonary disease, unspecified: Secondary | ICD-10-CM | POA: Insufficient documentation

## 2021-11-05 ENCOUNTER — Telehealth: Payer: Self-pay

## 2021-11-05 ENCOUNTER — Other Ambulatory Visit: Payer: Self-pay

## 2021-11-05 ENCOUNTER — Encounter: Payer: Self-pay | Admitting: Podiatry

## 2021-11-05 ENCOUNTER — Ambulatory Visit (INDEPENDENT_AMBULATORY_CARE_PROVIDER_SITE_OTHER): Payer: Medicare Other | Admitting: Podiatry

## 2021-11-05 DIAGNOSIS — M79674 Pain in right toe(s): Secondary | ICD-10-CM

## 2021-11-05 DIAGNOSIS — B351 Tinea unguium: Secondary | ICD-10-CM | POA: Diagnosis not present

## 2021-11-05 DIAGNOSIS — E1142 Type 2 diabetes mellitus with diabetic polyneuropathy: Secondary | ICD-10-CM | POA: Diagnosis not present

## 2021-11-05 DIAGNOSIS — L84 Corns and callosities: Secondary | ICD-10-CM

## 2021-11-05 DIAGNOSIS — D689 Coagulation defect, unspecified: Secondary | ICD-10-CM | POA: Diagnosis not present

## 2021-11-05 DIAGNOSIS — M79675 Pain in left toe(s): Secondary | ICD-10-CM | POA: Diagnosis not present

## 2021-11-05 NOTE — Progress Notes (Signed)
This patient returns to my office for at risk foot care.  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  This patient is unable to cut nails himself since the patient cannot reach his nails.These nails are painful walking and wearing shoes. Patient also has painful callus right foot. This patient presents for at risk foot care today.  General Appearance  Alert, conversant and in no acute stress.  Vascular  Dorsalis pedis and posterior tibial  pulses are palpable  bilaterally.  Capillary return is within normal limits  bilaterally. Temperature is within normal limits  bilaterally.  Neurologic  Senn-Weinstein monofilament wire test diminished  bilaterally. Muscle power within normal limits bilaterally.  Nails Thick disfigured discolored nails with subungual debris  from hallux to fifth toes bilaterally. No evidence of bacterial infection or drainage bilaterally.  Orthopedic  No limitations of motion  feet .  No crepitus or effusions noted.  No bony pathology .  Hammer toes  B/L.  Skin  normotropic skin with no porokeratosis noted bilaterally.  No signs of infections or ulcers noted.   Symptomatic callus  sub 3/4 right foot.  Onychomycosis  Pain in right toes  Pain in left toes  Callus right forefoot  Consent was obtained for treatment procedures.   Mechanical debridement of nails 1-5  bilaterally performed with a nail nipper.  Filed with dremel without incident. Patient says he wants his diabetic shoes.  Debridement of callus right forefoot   Return office visit    3 months                  Told patient to return for periodic foot care and evaluation due to potential at risk complications.   Gardiner Barefoot DPM

## 2021-11-05 NOTE — Telephone Encounter (Signed)
Spoke with patient, feels much better and denied any symptoms related to covid. Plans to resume rehab again next Monday, 2/6.

## 2021-11-09 ENCOUNTER — Telehealth: Payer: Medicare Other

## 2021-11-09 ENCOUNTER — Ambulatory Visit (INDEPENDENT_AMBULATORY_CARE_PROVIDER_SITE_OTHER): Payer: Medicare Other

## 2021-11-09 DIAGNOSIS — Z9181 History of falling: Secondary | ICD-10-CM

## 2021-11-09 DIAGNOSIS — M25551 Pain in right hip: Secondary | ICD-10-CM

## 2021-11-09 DIAGNOSIS — J431 Panlobular emphysema: Secondary | ICD-10-CM

## 2021-11-09 DIAGNOSIS — I10 Essential (primary) hypertension: Secondary | ICD-10-CM

## 2021-11-09 DIAGNOSIS — F324 Major depressive disorder, single episode, in partial remission: Secondary | ICD-10-CM

## 2021-11-09 DIAGNOSIS — J449 Chronic obstructive pulmonary disease, unspecified: Secondary | ICD-10-CM

## 2021-11-09 DIAGNOSIS — M79651 Pain in right thigh: Secondary | ICD-10-CM

## 2021-11-09 DIAGNOSIS — R2689 Other abnormalities of gait and mobility: Secondary | ICD-10-CM

## 2021-11-09 DIAGNOSIS — E1142 Type 2 diabetes mellitus with diabetic polyneuropathy: Secondary | ICD-10-CM

## 2021-11-09 DIAGNOSIS — I5032 Chronic diastolic (congestive) heart failure: Secondary | ICD-10-CM

## 2021-11-09 DIAGNOSIS — E782 Mixed hyperlipidemia: Secondary | ICD-10-CM

## 2021-11-09 NOTE — Patient Instructions (Signed)
Visit Information  Thank you for taking time to visit with me today. Please don't hesitate to contact me if I can be of assistance to you before our next scheduled telephone appointment.  Following are the goals we discussed today:  RNCM Clinical Goal(s):  Patient will verbalize understanding of plan for management of CHF, HTN, HLD, COPD, DMII, Depression, Osteoarthritis, and Falls  as evidenced by keeping appointments, following the plan of care, and working with the CCM team to optimize health and wellbeing  take all medications exactly as prescribed and will call provider for medication related questions as evidenced by compliance with medications and calling for refills before running out of medications    attend all scheduled medical appointments: 11-13-2021 at 220 pm as evidenced by keeping appointments and calling for schedule change needs.        demonstrate improved and ongoing adherence to prescribed treatment plan for CHF, HTN, HLD, COPD, DMII, Depression, Osteoarthritis, and falls prevention as evidenced by VS stable, routine lab work, keeping appointments, A1C WNL, Pain controlled, and no acute exacerbations of CHF or COPD. continue to work with Consulting civil engineer and/or Social Worker to address care management and care coordination needs related to CHF, HTN, HLD, COPD, DMII, Depression, Osteoarthritis, and falls prevention  as evidenced by adherence to CM Team Scheduled appointments     work with pharmacist to address medications, medication reconciliation, and management  related to CHF, HTN, HLD, COPD, DMII, Depression, Osteoarthritis, and falls prevention as evidenced by review of EMR and patient or pharmacist report    demonstrate ongoing self health care management ability effective management of chronic conditions as evidenced by working with CCM team through collaboration with Consulting civil engineer, provider, and care team.    Interventions: 1:1 collaboration with primary care provider  regarding development and update of comprehensive plan of care as evidenced by provider attestation and co-signature Inter-disciplinary care team collaboration (see longitudinal plan of care) Evaluation of current treatment plan related to  self management and patient's adherence to plan as established by provider     Heart Failure Interventions:  (Status: Goal on Track (progressing): YES.)  Long Term Goal     Wt Readings from Last 3 Encounters:  10/24/21 214 lb (97.1 kg)  08/17/21 208 lb 4.8 oz (94.5 kg)  08/10/21 207 lb (93.9 kg)    Basic overview and discussion of pathophysiology of Heart Failure reviewed Provided education on low sodium diet Reviewed Heart Failure Action Plan in depth and provided written copy Assessed need for readable accurate scales in home Provided education about placing scale on hard, flat surface Advised patient to weigh each morning after emptying bladder Discussed importance of daily weight and advised patient to weigh and record daily Reviewed role of diuretics in prevention of fluid overload and management of heart failure Discussed the importance of keeping all appointments with provider Provided patient with education about the role of exercise in the management of heart failure Advised patient to discuss changes in water weight and exacerbations of heart failure with provider Screening for signs and symptoms of depression related to chronic disease state  Assessed social determinant of health barriers   COPD: (Status: Goal on Track (progressing): YES.) Long Term Goal  Reviewed medications with patient, including use of prescribed maintenance and rescue inhalers, and provided instruction on medication management and the importance of adherence. 11-09-2021: The patient is compliant with medications Provided patient with basic written and verbal COPD education on self care/management/and exacerbation prevention Advised  patient to track and manage COPD  triggers. 11-09-2021: Review of triggers and education given. The patient was in the ER in January for exacerbation of COPD. The patient states he is doing fine now. Is coughing up a lot of clear sputum. Education on weather changes and sinus concerns and to discuss with pcp.  Provided written and verbal instructions on pursed lip breathing and utilized returned demonstration as teach back Provided instruction about proper use of medications used for management of COPD including inhalers Advised patient to self assesses COPD action plan zone and make appointment with provider if in the yellow zone for 48 hours without improvement Advised patient to engage in light exercise as tolerated 3-5 days a week to aid in the the management of COPD Provided education about and advised patient to utilize infection prevention strategies to reduce risk of respiratory infection. 11-09-2021: Denies any recent respiratory infections or other concerns. Will continue to monitor.  Discussed the importance of adequate rest and management of fatigue with COPD   Diabetes:  (Status: Goal on Track (progressing): YES.) Long Term Goal         Lab Results  Component Value Date    HGBA1C 6.6 (H) 05/18/2021  Assessed patient's understanding of A1c goal: <7% Provided education to patient about basic DM disease process; Reviewed medications with patient and discussed importance of medication adherence;        Reviewed prescribed diet with patient heart healthy/ADA diet ; Counseled on importance of regular laboratory monitoring as prescribed;        Discussed plans with patient for ongoing care management follow up and provided patient with direct contact information for care management team;      Provided patient with written educational materials related to hypo and hyperglycemia and importance of correct treatment. 11-09-2021: The patient states that his blood sugars have been good. Denies any lows or highs. Has been going to the  bathroom and peeing a lot but denies blood sugars being high. Wants to talk to the pcp on Friday concerning this.        Reviewed scheduled/upcoming provider appointments including: 11-13-2021 at 220 pm;         Advised patient, providing education and rationale, to check cbg before meals and at bedtime, when you have symptoms of low or high blood sugar, and before and after exercise and record        call provider for findings outside established parameters;       Review of patient status, including review of consultants reports, relevant laboratory and other test results, and medications completed;       Advised patient to discuss changes in DM, questions or concerns with provider;   Last eye exam 6 months ago. Is going to call for an appointment soon due to a cataract that needs to be removed (11-09-2021)      Falls:  (Status: Goal on Track (progressing): YES.) Long Term Goal  Provided written and verbal education re: potential causes of falls and Fall prevention strategies Reviewed medications and discussed potential side effects of medications such as dizziness and frequent urination Advised patient of importance of notifying provider of falls. 11-09-2021: the patient states that he has not had any falls but he has had trouble walking. He is using a cane when he is out in public and a walker when he is at home. He states that he may have to have surgery on his feet. The patient states that if he had his  diabetic shoes this would help. He has approval but needs sign off from a MD. Will discuss with pcp at upcoming visit.  Assessed for signs and symptoms of orthostatic hypotension Assessed for falls since last encounter. 11-09-2021: Denies any recent falls.  Assessed patients knowledge of fall risk prevention secondary to previously provided education Provided patient information for fall alert systems Assessed working status of life alert bracelet and patient adherence Advised patient to discuss  diabetic shoes and getting the pcp to sign off so the patient can get his diabetic shoes.  with provider   Deoression   (Status: Goal on Track (progressing): YES.) Long Term Goal  Evaluation of current treatment plan related to Depression, Mental Health Concerns  self-management and patient's adherence to plan as established by provider. 11-09-2021: The patient is doing well. He has a "lady friend" that respects him and is good to him. They do a lot of things together and he enjoys her company.  Discussed plans with patient for ongoing care management follow up and provided patient with direct contact information for care management team Advised patient to call the office for changes in mood, anxiety, or depression, or any new concerns related to mental health and well being ; Provided education to patient re: importance of mental health wellness and well being. Working with the CCM team to effectively manage mental health and well being; Reviewed medications with patient and discussed compliance; Provided patient with stress reduction educational materials related to depression and increased anxiety; Reviewed scheduled/upcoming provider appointments including 11-13-2021 at 220 pm; Discussed plans with patient for ongoing care management follow up and provided patient with direct contact information for care management team; Advised patient to discuss new mental health concerns with provider;   Hyperlipidemia:  (Status: Goal on Track (progressing): YES.) Long Term Goal       Lab Results  Component Value Date    CHOL 116 05/18/2021    HDL 45 05/18/2021    LDLCALC 53 05/18/2021    TRIG 101 05/18/2021    CHOLHDL 2.6 05/18/2021      Medication review performed; medication list updated in electronic medical record.  Provider established cholesterol goals reviewed; Counseled on importance of regular laboratory monitoring as prescribed; Provided HLD educational materials; Reviewed role and benefits  of statin for ASCVD risk reduction; Discussed strategies to manage statin-induced myalgias; Reviewed importance of limiting foods high in cholesterol;   Hypertension: (Status: Goal on Track (progressing): YES.) Long Term Goal  Last practice recorded BP readings:     BP Readings from Last 3 Encounters:  10/24/21 (!) 143/87  08/10/21 (!) 149/63  07/24/21 (!) 148/78  Most recent eGFR/CrCl:       Lab Results  Component Value Date    EGFR 59 (L) 08/03/2021    No components found for: CRCL   Evaluation of current treatment plan related to hypertension self management and patient's adherence to plan as established by provider;   Provided education to patient re: stroke prevention, s/s of heart attack and stroke; Reviewed prescribed diet heart healthy/ADA diet  Reviewed medications with patient and discussed importance of compliance;  Discussed plans with patient for ongoing care management follow up and provided patient with direct contact information for care management team; Advised patient, providing education and rationale, to monitor blood pressure daily and record, calling PCP for findings outside established parameters;  Advised patient to discuss blood pressure trends and heart health with provider; Provided education on prescribed diet heart healthy/ADA diet ;  Discussed complications of poorly controlled blood pressure such as heart disease, stroke, circulatory complications, vision complications, kidney impairment, sexual dysfunction;    Pain:  (Status: Goal on Track (progressing): YES.) Long Term Goal  Pain assessment performed. 11-09-2021: The patient rates his osteoarthritis pain at a 7 today on a scale of 0-10. States it was a 9 yesterday. He feels if he had his diabetic shoes this would help a lot. Will discuss with pcp on visit on Friday Medications reviewed. 11-09-2021: States compliance with medications. Will continue to monitor.  Reviewed provider established plan for pain  management; Discussed importance of adherence to all scheduled medical appointments; Counseled on the importance of reporting any/all new or changed pain symptoms or management strategies to pain management provider; Advised patient to report to care team affect of pain on daily activities; Discussed use of relaxation techniques and/or diversional activities to assist with pain reduction (distraction, imagery, relaxation, massage, acupressure, TENS, heat, and cold application; Reviewed with patient prescribed pharmacological and nonpharmacological pain relief strategies; Advised patient to discuss unresolved pain, changes in level or intensity of pain with provider;   Patient Goals/Self-Care Activities: Take medications as prescribed   Attend all scheduled provider appointments Call pharmacy for medication refills 3-7 days in advance of running out of medications Attend church or other social activities Perform all self care activities independently  Perform IADL's (shopping, preparing meals, housekeeping, managing finances) independently Call provider office for new concerns or questions  Work with the social worker to address care coordination needs and will continue to work with the clinical team to address health care and disease management related needs call the Suicide and Crisis Lifeline: 988 call the [place] National Suicide Prevention Lifeline: 515-732-4799 or TTY: 531-169-9860 TTY 318-476-1455) to talk to a trained counselor call 1-800-273-TALK (toll free, 24 hour hotline) if experiencing a Mental Health or [place]  call office if I gain more than 2 pounds in one day or 5 pounds in one week keep legs up while sitting use salt in moderation watch for swelling in feet, ankles and legs every day weigh myself daily bring diary to all appointments develop a rescue plan follow rescue plan if symptoms flare-up eat more whole grains, fruits and vegetables, lean meats  and healthy fats know when to call the doctorfor changes in CHF track symptoms and what helps feel better or worse dress right for the weather, hot or cold schedule appointment with eye doctor check blood sugar at prescribed times: before meals and at bedtime, when you have symptoms of low or high blood sugar, and before and after exercise check feet daily for cuts, sores or redness enter blood sugar readings and medication or insulin into daily log take the blood sugar log to all doctor visits take the blood sugar meter to all doctor visits set goal weight trim toenails straight across drink 6 to 8 glasses of water each day eat fish at least once per week fill half of plate with vegetables limit fast food meals to no more than 1 per week manage portion size prepare main meal at home 3 to 5 days each week read food labels for fat, fiber, carbohydrates and portion size reduce red meat to 2 to 3 times a week keep feet up while sitting wash and dry feet carefully every day wear comfortable, cotton socks wear comfortable, well-fitting shoes - avoid second hand smoke - eliminate smoking in my home - identify and avoid work-related triggers - identify and remove indoor  air pollutants - limit outdoor activity during cold weather - listen for public air quality announcements every day - do breathing exercises every day - arrange respite care for caregiver - begin a symptom diary - develop a rescue plan - eliminate symptom triggers at home - follow rescue plan if symptoms flare-up - keep follow-up appointments: heart healthy/ADA - use an extra pillow to sleep - develop a new routine to improve sleep - don't eat or exercise right before bedtime - eat healthy/prescribed diet: heart healthy/ADA diet  - get at least 7 to 8 hours of sleep at night - use devices that will help like a cane, sock-puller or reacher - practice relaxation or meditation daily - do breathing exercises every  day - do exercises in a comfortable position that makes breathing as easy as possible check blood pressure 3 times per week choose a place to take my blood pressure (home, clinic or office, retail store) write blood pressure results in a log or diary learn about high blood pressure keep a blood pressure log take blood pressure log to all doctor appointments call doctor for signs and symptoms of high blood pressure develop an action plan for high blood pressure keep all doctor appointments take medications for blood pressure exactly as prescribed report new symptoms to your doctor eat more whole grains, fruits and vegetables, lean meats and healthy fats - call for medicine refill 2 or 3 days before it runs out - take all medications exactly as prescribed - call doctor with any symptoms you believe are related to your medicine - call doctor when you experience any new symptoms - go to all doctor appointments as scheduled - adhere to prescribed diet: heart healthy/ADA diet       Our next appointment is by telephone on 12-28-2021 at 345 pm  Please call the care guide team at [phone] if you need to cancel or reschedule your appointment.   If you are experiencing a Mental Health or [place] or need someone to talk to, please call the Suicide and Crisis Lifeline: 988 call the [place] National Suicide Prevention Lifeline: (660)374-9842 or TTY: (734) 413-4805 TTY (734)715-4104) to talk to a trained counselor call 1-800-273-TALK (toll free, 24 hour hotline)   The patient verbalized understanding of instructions, educational materials, and care plan provided today and declined offer to receive copy of patient instructions, educational materials, and care plan.   Noreene Larsson RN, MSN, [facility] [place] Mobile: [phone]

## 2021-11-09 NOTE — Chronic Care Management (AMB) (Signed)
Chronic Care Management   CCM RN Visit Note  11/09/2021 Name: Nathaniel Little MRN: [id number] DOB: 1946/09/05  Subjective: Nathaniel Little is a 76 y.o. year old male who is a primary care patient of Jearld Fenton, NP. The care management team was consulted for assistance with disease management and care coordination needs.    Engaged with patient by telephone for follow up visit in response to provider referral for case management and/or care coordination services.   Consent to Services:  The patient was given information about Chronic Care Management services, agreed to services, and gave verbal consent prior to initiation of services.  Please see initial visit note for detailed documentation.   Patient agreed to services and verbal consent obtained.   Assessment: Review of patient past medical history, allergies, medications, health status, including review of consultants reports, laboratory and other test data, was performed as part of comprehensive evaluation and provision of chronic care management services.   SDOH (Social Determinants of Health) assessments and interventions performed:    CCM Care Plan  Allergies  Allergen Reactions   Codeine Itching    Outpatient Encounter Medications as of 11/09/2021  Medication Sig   glucose blood (ACCU-CHEK GUIDE) test strip Check blood sugar twice daily   albuterol (VENTOLIN HFA) 108 (90 Base) MCG/ACT inhaler Inhale 2 puffs into the lungs every 6 (six) hours as needed for wheezing or shortness of breath.   aspirin EC 81 MG tablet Take 81 mg by mouth daily.   BREZTRI AEROSPHERE 160-9-4.8 MCG/ACT AERO INHALE 2 PUFFS INTO THE LUNGS TWICE DAILY   calcium-vitamin D (OSCAL WITH D) 250-125 MG-UNIT tablet Take 1 tablet by mouth daily.   carvedilol (COREG) 6.25 MG tablet Take 1 tablet (6.25 mg total) by mouth 2 (two) times daily with a meal.   cloNIDine (CATAPRES) 0.1 MG tablet TAKE 1 TABLET BY MOUTH TWICE DAILY   ezetimibe (ZETIA) 10 MG tablet Take 1  tablet (10 mg total) by mouth daily.   fluticasone (FLONASE) 50 MCG/ACT nasal spray    gabapentin (NEURONTIN) 100 MG capsule Take 1-2 capsules (100-200 mg total) by mouth at bedtime.   insulin glargine (LANTUS) 100 unit/mL SOPN Inject 20 Units into the skin at bedtime.   ipratropium-albuterol (DUONEB) 0.5-2.5 (3) MG/3ML SOLN Take 3 mLs by nebulization every 6 (six) hours as needed (Asthma).   ketoconazole (NIZORAL) 2 % cream Apply 1 application topically daily.   metFORMIN (GLUCOPHAGE) 500 MG tablet Take 1,000 mg by mouth daily with breakfast. 2 in the morning 1 at night.   pantoprazole (PROTONIX) 40 MG tablet Take 1 tablet (40 mg total) by mouth 2 (two) times daily.   potassium chloride (KLOR-CON) 10 MEQ tablet Take 3 tablets (30 mEq total) by mouth daily.   SYMBICORT 80-4.5 MCG/ACT inhaler Inhale into the lungs.   torsemide (DEMADEX) 10 MG tablet Take 1 tablet (10 mg total) by mouth daily.   No facility-administered encounter medications on file as of 11/09/2021.    Patient Active Problem List   Diagnosis Date Noted   Chronic respiratory failure with hypoxia ([place]) 06/26/2021   Major depressive disorder with single episode, in partial remission ([place]) 06/22/2021   Class 1 obesity due to excess calories with body mass index (BMI) of 30.0 to 30.9 in adult 05/22/2021   Iron deficiency anemia 01/20/2021   Insomnia 01/20/2021   Malignant neoplasm of lower lobe of left lung ([place]) 12/14/2020   DM type 2 with diabetic peripheral neuropathy ([place]) 06/13/2020   Tremor 06/13/2020  Erectile dysfunction due to arterial insufficiency 09/24/2019   Lumbar degenerative disc disease 06/25/2019   Chronic heart failure with preserved ejection fraction ([place]) 01/19/2019   Severe aortic stenosis    Hyperlipidemia 07/20/2018   Claudication in peripheral vascular disease ([place]) 08/22/2017   Aortic atherosclerosis ([place]) 07/25/2017   Chronic obstructive pulmonary disease ([place]) 01/07/2017   GERD (gastroesophageal  reflux disease) 01/07/2017   Essential hypertension 01/07/2017   Sleep apnea with use of continuous positive airway pressure (CPAP) 01/06/2017    Conditions to be addressed/monitored:CHF, HTN, HLD, COPD, DMII, Depression, Osteoarthritis, and falls prevention  Problem: RNCM: Development of plan of care for Chronic Disease Management (CHF, HTN, HLD, COPD, DM, Depression, Chronic Pain, Falls)   Priority: High     Long-Range Goal: RNCM: Effectove Management  of plan of care for Chronic Disease Management (CHF, HTN, HLD, COPD, DM, Depression, Chronic Pain, Falls)   Start Date: 11/09/2021  Expected End Date: 11/09/2022  Priority: High  Note:   Current Barriers:  Knowledge Deficits related to plan of care for management of CHF, HTN, HLD, COPD, DMII, Chronic Pain, and Depression and Falls Precautions  Care Coordination needs related to Mental Health Concerns   Chronic Disease Management support and education needs related to CHF, HTN, HLD, COPD, DMII, Chronic Pain, and depression, falls prevention  RNCM Clinical Goal(s):  Patient will verbalize understanding of plan for management of CHF, HTN, HLD, COPD, DMII, Depression, Osteoarthritis, and Falls  as evidenced by keeping appointments, following the plan of care, and working with the CCM team to optimize health and wellbeing  take all medications exactly as prescribed and will call provider for medication related questions as evidenced by compliance with medications and calling for refills before running out of medications    attend all scheduled medical appointments: 11-13-2021 at 220 pm as evidenced by keeping appointments and calling for schedule change needs.        demonstrate improved and ongoing adherence to prescribed treatment plan for CHF, HTN, HLD, COPD, DMII, Depression, Osteoarthritis, and falls prevention as evidenced by VS stable, routine lab work, keeping appointments, A1C WNL, Pain controlled, and no acute exacerbations of CHF or  COPD. continue to work with Consulting civil engineer and/or Social Worker to address care management and care coordination needs related to CHF, HTN, HLD, COPD, DMII, Depression, Osteoarthritis, and falls prevention  as evidenced by adherence to CM Team Scheduled appointments     work with pharmacist to address medications, medication reconciliation, and management  related to CHF, HTN, HLD, COPD, DMII, Depression, Osteoarthritis, and falls prevention as evidenced by review of EMR and patient or pharmacist report    demonstrate ongoing self health care management ability effective management of chronic conditions as evidenced by working with CCM team through collaboration with Consulting civil engineer, provider, and care team.   Interventions: 1:1 collaboration with primary care provider regarding development and update of comprehensive plan of care as evidenced by provider attestation and co-signature Inter-disciplinary care team collaboration (see longitudinal plan of care) Evaluation of current treatment plan related to  self management and patient's adherence to plan as established by provider   Heart Failure Interventions:  (Status: Goal on Track (progressing): YES.)  Long Term Goal  Wt Readings from Last 3 Encounters:  10/24/21 214 lb (97.1 kg)  08/17/21 208 lb 4.8 oz (94.5 kg)  08/10/21 207 lb (93.9 kg)   Basic overview and discussion of pathophysiology of Heart Failure reviewed Provided education on low sodium diet Reviewed Heart Failure  Action Plan in depth and provided written copy Assessed need for readable accurate scales in home Provided education about placing scale on hard, flat surface Advised patient to weigh each morning after emptying bladder Discussed importance of daily weight and advised patient to weigh and record daily Reviewed role of diuretics in prevention of fluid overload and management of heart failure Discussed the importance of keeping all appointments with provider Provided  patient with education about the role of exercise in the management of heart failure Advised patient to discuss changes in water weight and exacerbations of heart failure with provider Screening for signs and symptoms of depression related to chronic disease state  Assessed social determinant of health barriers  COPD: (Status: Goal on Track (progressing): YES.) Long Term Goal  Reviewed medications with patient, including use of prescribed maintenance and rescue inhalers, and provided instruction on medication management and the importance of adherence. 11-09-2021: The patient is compliant with medications Provided patient with basic written and verbal COPD education on self care/management/and exacerbation prevention Advised patient to track and manage COPD triggers. 11-09-2021: Review of triggers and education given. The patient was in the ER in January for exacerbation of COPD. The patient states he is doing fine now. Is coughing up a lot of clear sputum. Education on weather changes and sinus concerns and to discuss with pcp.  Provided written and verbal instructions on pursed lip breathing and utilized returned demonstration as teach back Provided instruction about proper use of medications used for management of COPD including inhalers Advised patient to self assesses COPD action plan zone and make appointment with provider if in the yellow zone for 48 hours without improvement Advised patient to engage in light exercise as tolerated 3-5 days a week to aid in the the management of COPD Provided education about and advised patient to utilize infection prevention strategies to reduce risk of respiratory infection. 11-09-2021: Denies any recent respiratory infections or other concerns. Will continue to monitor.  Discussed the importance of adequate rest and management of fatigue with COPD  Diabetes:  (Status: Goal on Track (progressing): YES.) Long Term Goal   Lab Results  Component Value Date    HGBA1C 6.6 (H) 05/18/2021  Assessed patient's understanding of A1c goal: <7% Provided education to patient about basic DM disease process; Reviewed medications with patient and discussed importance of medication adherence;        Reviewed prescribed diet with patient heart healthy/ADA diet ; Counseled on importance of regular laboratory monitoring as prescribed;        Discussed plans with patient for ongoing care management follow up and provided patient with direct contact information for care management team;      Provided patient with written educational materials related to hypo and hyperglycemia and importance of correct treatment. 11-09-2021: The patient states that his blood sugars have been good. Denies any lows or highs. Has been going to the bathroom and peeing a lot but denies blood sugars being high. Wants to talk to the pcp on Friday concerning this.        Reviewed scheduled/upcoming provider appointments including: 11-13-2021 at 220 pm;         Advised patient, providing education and rationale, to check cbg before meals and at bedtime, when you have symptoms of low or high blood sugar, and before and after exercise and record        call provider for findings outside established parameters;       Review of patient status, including  review of consultants reports, relevant laboratory and other test results, and medications completed;       Advised patient to discuss changes in DM, questions or concerns with provider;   Last eye exam 6 months ago. Is going to call for an appointment soon due to a cataract that needs to be removed (11-09-2021)     Falls:  (Status: Goal on Track (progressing): YES.) Long Term Goal  Provided written and verbal education re: potential causes of falls and Fall prevention strategies Reviewed medications and discussed potential side effects of medications such as dizziness and frequent urination Advised patient of importance of notifying provider of falls.  11-09-2021: the patient states that he has not had any falls but he has had trouble walking. He is using a cane when he is out in public and a walker when he is at home. He states that he may have to have surgery on his feet. The patient states that if he had his diabetic shoes this would help. He has approval but needs sign off from a MD. Will discuss with pcp at upcoming visit.  Assessed for signs and symptoms of orthostatic hypotension Assessed for falls since last encounter. 11-09-2021: Denies any recent falls.  Assessed patients knowledge of fall risk prevention secondary to previously provided education Provided patient information for fall alert systems Assessed working status of life alert bracelet and patient adherence Advised patient to discuss diabetic shoes and getting the pcp to sign off so the patient can get his diabetic shoes.  with provider  Deoression   (Status: Goal on Track (progressing): YES.) Long Term Goal  Evaluation of current treatment plan related to Depression, Mental Health Concerns  self-management and patient's adherence to plan as established by provider. 11-09-2021: The patient is doing well. He has a "lady friend" that respects him and is good to him. They do a lot of things together and he enjoys her company.  Discussed plans with patient for ongoing care management follow up and provided patient with direct contact information for care management team Advised patient to call the office for changes in mood, anxiety, or depression, or any new concerns related to mental health and well being ; Provided education to patient re: importance of mental health wellness and well being. Working with the CCM team to effectively manage mental health and well being; Reviewed medications with patient and discussed compliance; Provided patient with stress reduction educational materials related to depression and increased anxiety; Reviewed scheduled/upcoming provider appointments  including 11-13-2021 at 220 pm; Discussed plans with patient for ongoing care management follow up and provided patient with direct contact information for care management team; Advised patient to discuss new mental health concerns with provider;  Hyperlipidemia:  (Status: Goal on Track (progressing): YES.) Long Term Goal  Lab Results  Component Value Date   CHOL 116 05/18/2021   HDL 45 05/18/2021   LDLCALC 53 05/18/2021   TRIG 101 05/18/2021   CHOLHDL 2.6 05/18/2021     Medication review performed; medication list updated in electronic medical record.  Provider established cholesterol goals reviewed; Counseled on importance of regular laboratory monitoring as prescribed; Provided HLD educational materials; Reviewed role and benefits of statin for ASCVD risk reduction; Discussed strategies to manage statin-induced myalgias; Reviewed importance of limiting foods high in cholesterol;  Hypertension: (Status: Goal on Track (progressing): YES.) Long Term Goal  Last practice recorded BP readings:  BP Readings from Last 3 Encounters:  10/24/21 (!) 143/87  08/10/21 (!) 149/63  07/24/21 [name])  148/78  Most recent eGFR/CrCl:  Lab Results  Component Value Date   EGFR 59 (L) 08/03/2021    No components found for: CRCL  Evaluation of current treatment plan related to hypertension self management and patient's adherence to plan as established by provider;   Provided education to patient re: stroke prevention, s/s of heart attack and stroke; Reviewed prescribed diet heart healthy/ADA diet  Reviewed medications with patient and discussed importance of compliance;  Discussed plans with patient for ongoing care management follow up and provided patient with direct contact information for care management team; Advised patient, providing education and rationale, to monitor blood pressure daily and record, calling PCP for findings outside established parameters;  Advised patient to discuss blood  pressure trends and heart health with provider; Provided education on prescribed diet heart healthy/ADA diet ;  Discussed complications of poorly controlled blood pressure such as heart disease, stroke, circulatory complications, vision complications, kidney impairment, sexual dysfunction;   Pain:  (Status: Goal on Track (progressing): YES.) Long Term Goal  Pain assessment performed. 11-09-2021: The patient rates his osteoarthritis pain at a 7 today on a scale of 0-10. States it was a 9 yesterday. He feels if he had his diabetic shoes this would help a lot. Will discuss with pcp on visit on Friday Medications reviewed. 11-09-2021: States compliance with medications. Will continue to monitor.  Reviewed provider established plan for pain management; Discussed importance of adherence to all scheduled medical appointments; Counseled on the importance of reporting any/all new or changed pain symptoms or management strategies to pain management provider; Advised patient to report to care team affect of pain on daily activities; Discussed use of relaxation techniques and/or diversional activities to assist with pain reduction (distraction, imagery, relaxation, massage, acupressure, TENS, heat, and cold application; Reviewed with patient prescribed pharmacological and nonpharmacological pain relief strategies; Advised patient to discuss unresolved pain, changes in level or intensity of pain with provider;  Patient Goals/Self-Care Activities: Take medications as prescribed   Attend all scheduled provider appointments Call pharmacy for medication refills 3-7 days in advance of running out of medications Attend church or other social activities Perform all self care activities independently  Perform IADL's (shopping, preparing meals, housekeeping, managing finances) independently Call provider office for new concerns or questions  Work with the social worker to address care coordination needs and will  continue to work with the clinical team to address health care and disease management related needs call the Suicide and Crisis Lifeline: 988 call the [place] National Suicide Prevention Lifeline: 318-650-6128 or TTY: 719-874-6667 TTY (646)827-2452) to talk to a trained counselor call 1-800-273-TALK (toll free, 24 hour hotline) if experiencing a Mental Health or [place]  call office if I gain more than 2 pounds in one day or 5 pounds in one week keep legs up while sitting use salt in moderation watch for swelling in feet, ankles and legs every day weigh myself daily bring diary to all appointments develop a rescue plan follow rescue plan if symptoms flare-up eat more whole grains, fruits and vegetables, lean meats and healthy fats know when to call the doctorfor changes in CHF track symptoms and what helps feel better or worse dress right for the weather, hot or cold schedule appointment with eye doctor check blood sugar at prescribed times: before meals and at bedtime, when you have symptoms of low or high blood sugar, and before and after exercise check feet daily for cuts, sores or redness enter blood sugar readings and  medication or insulin into daily log take the blood sugar log to all doctor visits take the blood sugar meter to all doctor visits set goal weight trim toenails straight across drink 6 to 8 glasses of water each day eat fish at least once per week fill half of plate with vegetables limit fast food meals to no more than 1 per week manage portion size prepare main meal at home 3 to 5 days each week read food labels for fat, fiber, carbohydrates and portion size reduce red meat to 2 to 3 times a week keep feet up while sitting wash and dry feet carefully every day wear comfortable, cotton socks wear comfortable, well-fitting shoes - avoid second hand smoke - eliminate smoking in my home - identify and avoid work-related triggers - identify and  remove indoor air pollutants - limit outdoor activity during cold weather - listen for public air quality announcements every day - do breathing exercises every day - arrange respite care for caregiver - begin a symptom diary - develop a rescue plan - eliminate symptom triggers at home - follow rescue plan if symptoms flare-up - keep follow-up appointments: heart healthy/ADA - use an extra pillow to sleep - develop a new routine to improve sleep - don't eat or exercise right before bedtime - eat healthy/prescribed diet: heart healthy/ADA diet  - get at least 7 to 8 hours of sleep at night - use devices that will help like a cane, sock-puller or reacher - practice relaxation or meditation daily - do breathing exercises every day - do exercises in a comfortable position that makes breathing as easy as possible check blood pressure 3 times per week choose a place to take my blood pressure (home, clinic or office, retail store) write blood pressure results in a log or diary learn about high blood pressure keep a blood pressure log take blood pressure log to all doctor appointments call doctor for signs and symptoms of high blood pressure develop an action plan for high blood pressure keep all doctor appointments take medications for blood pressure exactly as prescribed report new symptoms to your doctor eat more whole grains, fruits and vegetables, lean meats and healthy fats - call for medicine refill 2 or 3 days before it runs out - take all medications exactly as prescribed - call doctor with any symptoms you believe are related to your medicine - call doctor when you experience any new symptoms - go to all doctor appointments as scheduled - adhere to prescribed diet: heart healthy/ADA diet        Plan:Telephone follow up appointment with care management team member scheduled for:  12-28-2021 at 345 pm  Noreene Larsson RN, MSN, [facility] Mobile: [phone]

## 2021-11-11 ENCOUNTER — Ambulatory Visit: Payer: Medicare Other | Admitting: Internal Medicine

## 2021-11-11 ENCOUNTER — Telehealth: Payer: Self-pay

## 2021-11-11 DIAGNOSIS — D1039 Benign neoplasm of other parts of mouth: Secondary | ICD-10-CM | POA: Diagnosis not present

## 2021-11-11 NOTE — Telephone Encounter (Signed)
Nathaniel Little has to have a biopsy done on his mouth and has been out of town.  He plans to return next week.

## 2021-11-12 ENCOUNTER — Ambulatory Visit: Payer: Medicare Other | Admitting: Gastroenterology

## 2021-11-12 ENCOUNTER — Encounter: Payer: Self-pay | Admitting: Gastroenterology

## 2021-11-13 ENCOUNTER — Ambulatory Visit (INDEPENDENT_AMBULATORY_CARE_PROVIDER_SITE_OTHER): Payer: Medicare Other | Admitting: Internal Medicine

## 2021-11-13 ENCOUNTER — Encounter: Payer: Self-pay | Admitting: Internal Medicine

## 2021-11-13 ENCOUNTER — Other Ambulatory Visit: Payer: Self-pay

## 2021-11-13 VITALS — BP 126/63 | HR 91 | Temp 97.3°F | Wt 211.0 lb

## 2021-11-13 DIAGNOSIS — J418 Mixed simple and mucopurulent chronic bronchitis: Secondary | ICD-10-CM | POA: Diagnosis not present

## 2021-11-13 DIAGNOSIS — Z0001 Encounter for general adult medical examination with abnormal findings: Secondary | ICD-10-CM

## 2021-11-13 DIAGNOSIS — E1142 Type 2 diabetes mellitus with diabetic polyneuropathy: Secondary | ICD-10-CM

## 2021-11-13 DIAGNOSIS — R32 Unspecified urinary incontinence: Secondary | ICD-10-CM | POA: Insufficient documentation

## 2021-11-13 DIAGNOSIS — Z6830 Body mass index (BMI) 30.0-30.9, adult: Secondary | ICD-10-CM | POA: Diagnosis not present

## 2021-11-13 DIAGNOSIS — N3946 Mixed incontinence: Secondary | ICD-10-CM

## 2021-11-13 DIAGNOSIS — E6609 Other obesity due to excess calories: Secondary | ICD-10-CM

## 2021-11-13 DIAGNOSIS — E66811 Other obesity due to excess calories: Secondary | ICD-10-CM

## 2021-11-13 DIAGNOSIS — N1831 Chronic kidney disease, stage 3a: Secondary | ICD-10-CM | POA: Diagnosis not present

## 2021-11-13 DIAGNOSIS — K5909 Other constipation: Secondary | ICD-10-CM

## 2021-11-13 LAB — POCT GLYCOSYLATED HEMOGLOBIN (HGB A1C): Hemoglobin A1C: 10.9 % — AB (ref 4.0–5.6)

## 2021-11-13 MED ORDER — SENNA-DOCUSATE SODIUM 8.6-50 MG PO TABS: 1.0000 | ORAL_TABLET | Freq: Two times a day (BID) | ORAL | 0 refills | Status: DC

## 2021-11-13 NOTE — Progress Notes (Signed)
Subjective:    Patient ID: Nathaniel Little, male    DOB: 24-Oct-1945, 76 y.o.   MRN: [id number]  HPI  Patient presents to clinic today for his annual exam.  Flu: 06/2021 Tetanus: unsure COVID: Moderna x3, Pfizer x1 Pneumovax: 05/2021 Prevnar: 12/2017 Shingrix: Never PSA screening: 12/2019 Colon screening: Cologuard, 05/2018 Vision screening: as needed Dentist: annually  Diet: He does not eat meat. He consumes fruits and veggies. He does eat some fried foods. He drinks mostly water. Exercise: None  Review of Systems     Past Medical History:  Diagnosis Date   (HFpEF) heart failure with preserved ejection fraction ([place])    a. 08/2017 Echo: EF 60-65%, Gr1 DD.   Arthritis    COPD (chronic obstructive pulmonary disease) ([place])    Coronary artery disease 2021   Depression    Diabetes mellitus without complication (H[facility])    Hypertension    Incidental pulmonary nodule, greater than or equal to 85m 03/11/2020   Left lower lobe - suspicious for bronchogenic neoplasm   Lymphedema    Morbid obesity ([facility]    Prostate cancer ([facility] 03/2013   ? PANCREATIC   S/P TAVR (transcatheter aortic valve replacement) 04/01/2020   s/p TAVR with a 29 mm Edwards Sapien 3 THV via the TF approach by Drs MBuena Irish& ORoxy Manns  Severe aortic stenosis    Sleep apnea with use of continuous positive airway pressure (CPAP)    Tobacco abuse    Tremor    HEAD    Current Outpatient Medications  Medication Sig Dispense Refill   glucose blood (ACCU-CHEK GUIDE) test strip Check blood sugar twice daily 100 each 12   albuterol (VENTOLIN HFA) 108 (90 Base) MCG/ACT inhaler Inhale 2 puffs into the lungs every 6 (six) hours as needed for wheezing or shortness of breath. 18 g 1   aspirin EC 81 MG tablet Take 81 mg by mouth daily.     BREZTRI AEROSPHERE 160-9-4.8 MCG/ACT AERO INHALE 2 PUFFS INTO THE LUNGS TWICE DAILY 10.7 g 0   calcium-vitamin D (OSCAL WITH D) 250-125 MG-UNIT tablet Take 1 tablet by mouth daily.      carvedilol (COREG) 6.25 MG tablet Take 1 tablet (6.25 mg total) by mouth 2 (two) times daily with a meal. 180 tablet 3   cloNIDine (CATAPRES) 0.1 MG tablet TAKE 1 TABLET BY MOUTH TWICE DAILY 60 tablet 1   ezetimibe (ZETIA) 10 MG tablet Take 1 tablet (10 mg total) by mouth daily. 90 tablet 3   fluticasone (FLONASE) 50 MCG/ACT nasal spray      gabapentin (NEURONTIN) 100 MG capsule Take 1-2 capsules (100-200 mg total) by mouth at bedtime. 180 capsule 1   insulin glargine (LANTUS) 100 unit/mL SOPN Inject 20 Units into the skin at bedtime.     ipratropium-albuterol (DUONEB) 0.5-2.5 (3) MG/3ML SOLN Take 3 mLs by nebulization every 6 (six) hours as needed (Asthma). 360 mL 12   ketoconazole (NIZORAL) 2 % cream Apply 1 application topically daily. 15 g 0   metFORMIN (GLUCOPHAGE) 500 MG tablet Take 1,000 mg by mouth daily with breakfast. 2 in the morning 1 at night.     pantoprazole (PROTONIX) 40 MG tablet Take 1 tablet (40 mg total) by mouth 2 (two) times daily. 60 tablet 11   potassium chloride (KLOR-CON) 10 MEQ tablet Take 3 tablets (30 mEq total) by mouth daily. 90 tablet 6   SYMBICORT 80-4.5 MCG/ACT inhaler Inhale into the lungs.     torsemide (DEMADEX) 10 MG  tablet Take 1 tablet (10 mg total) by mouth daily. 30 tablet 6   No current facility-administered medications for this visit.    Allergies  Allergen Reactions   Codeine Itching    Family History  Problem Relation Age of Onset   Breast cancer Mother    Cancer Father        Black Lung   Cerebral palsy Brother    Tuberculosis Paternal Uncle    Prostate cancer Neg Hx    Kidney cancer Neg Hx    Bladder Cancer Neg Hx     Social History   Socioeconomic History   Marital status: Widowed    Spouse name: Not on file   Number of children: 2   Years of education: Not on file   Highest education level: Not on file  Occupational History   Occupation: retired -Patent attorney  Tobacco Use   Smoking status: Every Day    Packs/day:  4.00    Years: 68.00    Pack years: 272.00    Types: Cigarettes   Smokeless tobacco: Former    Types: Chew, Snuff    Quit date: 2000   Tobacco comments:    2-3 cigs a day   Vaping Use   Vaping Use: Never used  Substance and Sexual Activity   Alcohol use: Not Currently    Alcohol/week: 0.0 - 1.0 standard drinks    Comment: quit 40years ago   Drug use: No   Sexual activity: Not Currently  Other Topics Concern   Not on file  Social History Narrative   Living at [place] Strain: Low Risk    Difficulty of Paying Living Expenses: Not hard at all  Food Insecurity: No Food Insecurity   Worried About Charity fundraiser in the Last Year: Never true   [place] in the Last Year: Never true  Transportation Needs: No Transportation Needs   Lack of Transportation (Medical): No   Lack of Transportation (Non-Medical): No  Physical Activity: Inactive   Days of Exercise per Week: 0 days   Minutes of Exercise per Session: 0 min  Stress: Stress Concern Present   Feeling of Stress : To some extent  Social Connections: Moderately Isolated   Frequency of Communication with Friends and Family: More than three times a week   Frequency of Social Gatherings with Friends and Family: More than three times a week   Attends Religious Services: More than 4 times per year   Active Member of [employer] or Organizations: No   Attends Archivist Meetings: Never   Marital Status: Widowed  Human resources officer Violence: Not At Risk   Fear of Current or Ex-Partner: No   Emotionally Abused: No   Physically Abused: No   Sexually Abused: No     Constitutional: Pt reports fatigue. Denies fever, malaise, headache or abrupt weight changes.  HEENT: Denies eye pain, eye redness, ear pain, ringing in the ears, wax buildup, runny nose, nasal congestion, bloody nose, or sore throat. Respiratory: Patient reports chronic cough and shortness of  breath.  Denies difficulty breathing.   Cardiovascular: Denies chest pain, chest tightness, palpitations or swelling in the hands or feet.  Gastrointestinal: Pt reports poor appetite, constipation. Denies abdominal pain, bloating, diarrhea or blood in the stool.  GU: Patient reports erectile dysfunction, urinary incontinence.  Denies urgency, frequency, pain with urination, burning sensation, blood in urine, odor or discharge. Musculoskeletal:  Patient reports chronic back pain.  Denies decrease in range of motion, difficulty with gait, muscle pain or joint swelling.  Skin: Denies redness, rashes, lesions or ulcercations.  Neurological: Patient reports insomnia, peripheral neuropathy.  Denies dizziness, difficulty with memory, difficulty with speech or problems with balance and coordination.  Psych: Patient has a history of depression.  Denies anxiety, SI/HI.  No other specific complaints in a complete review of systems (except as listed in HPI above).  Objective:   Physical Exam  BP 126/63 (BP Location: Left Arm, Patient Position: Sitting, Cuff Size: Large)    Pulse 91    Temp (!) 97.3 F (36.3 C) (Temporal)    Wt 211 lb (95.7 kg)    SpO2 97%    BMI 31.16 kg/m   Wt Readings from Last 3 Encounters:  10/24/21 214 lb (97.1 kg)  08/17/21 208 lb 4.8 oz (94.5 kg)  08/10/21 207 lb (93.9 kg)    General: Appears his stated age, obese, in NAD. Skin: Warm, dry and intact.  Purpura noted of bilateral upper extremities. HEENT: Head: normal shape and size; Eyes: EOMs intact;  Cardiovascular: Normal rate and rhythm. S1,S2 noted.  No murmur, rubs or gallops noted. No JVD or BLE edema. No carotid bruits noted. Pulmonary/Chest: Increased effort effort and coarse vesicular breath sounds. No respiratory distress. No wheezes, rales or ronchi noted.  Abdomen: Soft and nontender. Normal bowel sounds.  Musculoskeletal: Strength 5/5 BUE/BLE.  Gait slow and steady with use of rolling walker. Neurological: Alert  and oriented.  Resting head tremor noted. Psychiatric: Mood and affect normal. Behavior is normal. Judgment and thought content normal.   BMET    Component Value Date/Time   NA 136 10/24/2021 0107   NA 141 08/03/2021 1323   K 4.1 10/24/2021 0107   CL 104 10/24/2021 0107   CO2 27 10/24/2021 0107   GLUCOSE 239 (H) 10/24/2021 0107   BUN 24 (H) 10/24/2021 0107   BUN 24 08/03/2021 1323   CREATININE 1.54 (H) 10/24/2021 0107   CREATININE 1.14 06/22/2021 0832   CALCIUM 9.3 10/24/2021 0107   GFRNONAA 46 (L) 10/24/2021 0107   GFRNONAA 70 12/06/2019 1420   GFRAA 55 (L) 11/07/2020 1534   GFRAA 81 12/06/2019 1420    Lipid Panel     Component Value Date/Time   CHOL 116 05/18/2021 1030   CHOL 149 02/08/2020 1228   TRIG 101 05/18/2021 1030   HDL 45 05/18/2021 1030   HDL 38 (L) 02/08/2020 1228   CHOLHDL 2.6 05/18/2021 1030   LDLCALC 53 05/18/2021 1030    CBC    Component Value Date/Time   WBC 9.2 10/24/2021 0107   RBC 4.19 (L) 10/24/2021 0107   HGB 12.4 (L) 10/24/2021 0107   HGB 13.2 07/24/2021 1543   HCT 37.4 (L) 10/24/2021 0107   HCT 38.8 07/24/2021 1543   PLT 181 10/24/2021 0107   PLT 194 07/24/2021 1543   MCV 89.3 10/24/2021 0107   MCV 93 07/24/2021 1543   MCH 29.6 10/24/2021 0107   MCHC 33.2 10/24/2021 0107   RDW 13.7 10/24/2021 0107   RDW 13.4 07/24/2021 1543   LYMPHSABS 1.3 06/29/2021 1316   LYMPHSABS 1.1 02/08/2020 1228   MONOABS 0.6 06/29/2021 1316   EOSABS 0.3 06/29/2021 1316   EOSABS 0.1 02/08/2020 1228   BASOSABS 0.1 06/29/2021 1316   BASOSABS 0.0 02/08/2020 1228    Hgb A1C Lab Results  Component Value Date   HGBA1C 6.6 (H) 05/18/2021  Assessment & Plan:   Preventative Health Maintenance:  Flu shot UTD Advised him if he gets bit or cut he will need to go get a tetanus vaccine insurance does not pay for this as a preventative Pneumovax and Prevnar UTD Encouraged him to get his COVID booster Discussed Shingrix vaccine, he can get this  at the pharmacy if he would like He does not want to screen for prostate cancer at this time He declines repeat Cologuard at this time Encouraged him to consume a balanced diet and exercise regimen Advised him to see an eye doctor and dentist annually We will check CBC, c-Met, lipid, A1c and urine microalbumin today  RTC in 3 months, follow-up chronic conditions  Webb Silversmith, NP This visit occurred during the SARS-CoV-2 public health emergency.  Safety protocols were in place, including screening questions prior to the visit, additional usage of staff PPE, and extensive cleaning of exam room while observing appropriate contact time as indicated for disinfecting solutions.

## 2021-11-13 NOTE — Assessment & Plan Note (Signed)
He is on Torsemide which could be contributing to his problem but he needs this medication for treatment of heart failure Advised him that this could be part of the normal aging process Encouraged timed voiding, wear depends as needed

## 2021-11-13 NOTE — Assessment & Plan Note (Signed)
Encourage diet and exercise for weight loss 

## 2021-11-13 NOTE — Patient Instructions (Signed)
Health Maintenance After Age 77 After age 77, you are at a higher risk for certain long-term diseases and infections as well as injuries from falls. Falls are a major cause of broken bones and head injuries in people who are older than age 77. Getting regular preventive care can help to keep you healthy and well. Preventive care includes getting regular testing and making lifestyle changes as recommended by your health care provider. Talk with your health care provider about: Which screenings and tests you should have. A screening is a test that checks for a disease when you have no symptoms. A diet and exercise plan that is right for you. What should I know about screenings and tests to prevent falls? Screening and testing are the best ways to find a health problem early. Early diagnosis and treatment give you the best chance of managing medical conditions that are common after age 77. Certain conditions and lifestyle choices may make you more likely to have a fall. Your health care provider may recommend: Regular vision checks. Poor vision and conditions such as cataracts can make you more likely to have a fall. If you wear glasses, make sure to get your prescription updated if your vision changes. Medicine review. Work with your health care provider to regularly review all of the medicines you are taking, including over-the-counter medicines. Ask your health care provider about any side effects that may make you more likely to have a fall. Tell your health care provider if any medicines that you take make you feel dizzy or sleepy. Strength and balance checks. Your health care provider may recommend certain tests to check your strength and balance while standing, walking, or changing positions. Foot health exam. Foot pain and numbness, as well as not wearing proper footwear, can make you more likely to have a fall. Screenings, including: Osteoporosis screening. Osteoporosis is a condition that causes  the bones to get weaker and break more easily. Blood pressure screening. Blood pressure changes and medicines to control blood pressure can make you feel dizzy. Depression screening. You may be more likely to have a fall if you have a fear of falling, feel depressed, or feel unable to do activities that you used to do. Alcohol use screening. Using too much alcohol can affect your balance and may make you more likely to have a fall. Follow these instructions at home: Lifestyle Do not drink alcohol if: Your health care provider tells you not to drink. If you drink alcohol: Limit how much you have to: 0-1 drink a day for women. 0-2 drinks a day for men. Know how much alcohol is in your drink. In the [place] one drink equals one 12 oz bottle of beer (355 mL), one 5 oz glass of wine (148 mL), or one 1 oz glass of hard liquor (44 mL). Do not use any products that contain nicotine or tobacco. These products include cigarettes, chewing tobacco, and vaping devices, such as e-cigarettes. If you need help quitting, ask your health care provider. Activity  Follow a regular exercise program to stay fit. This will help you maintain your balance. Ask your health care provider what types of exercise are appropriate for you. If you need a cane or walker, use it as recommended by your health care provider. Wear supportive shoes that have nonskid soles. Safety  Remove any tripping hazards, such as rugs, cords, and clutter. Install safety equipment such as grab bars in bathrooms and safety rails on stairs. Keep rooms and walkways   well-lit. General instructions Talk with your health care provider about your risks for falling. Tell your health care provider if: You fall. Be sure to tell your health care provider about all falls, even ones that seem minor. You feel dizzy, tiredness (fatigue), or off-balance. Take over-the-counter and prescription medicines only as told by your health care provider. These include  supplements. Eat a healthy diet and maintain a healthy weight. A healthy diet includes low-fat dairy products, low-fat (lean) meats, and fiber from whole grains, beans, and lots of fruits and vegetables. Stay current with your vaccines. Schedule regular health, dental, and eye exams. Summary Having a healthy lifestyle and getting preventive care can help to protect your health and wellness after age 77. Screening and testing are the best way to find a health problem early and help you avoid having a fall. Early diagnosis and treatment give you the best chance for managing medical conditions that are more common for people who are older than age 77. Falls are a major cause of broken bones and head injuries in people who are older than age 77. Take precautions to prevent a fall at home. Work with your health care provider to learn what changes you can make to improve your health and wellness and to prevent falls. This information is not intended to replace advice given to you by your health care provider. Make sure you discuss any questions you have with your health care provider. Document Revised: 02/09/2021 Document Reviewed: 02/09/2021 Elsevier Patient Education  2022 [employer] Inc[employer]  

## 2021-11-13 NOTE — Assessment & Plan Note (Signed)
POCT A1c 10.9% We will check urine microalbumin Increase Metformin to 1000 mg twice daily Increase Lantus to 22 units daily Encouraged him to consume a low-carb diet Encourage routine eye exams Encourage routine foot exams, he is waiting on diabetic shoes Flu, Pneumovax, Prevnar UTD Encouraged him to get his COVID booster He has a follow-up appoint with endocrinology in April

## 2021-11-13 NOTE — Assessment & Plan Note (Signed)
He has failed MiraLAX in the past We will start senna S1 tab 2 times daily Encouraged high-fiber diet and adequate water intake

## 2021-11-13 NOTE — Assessment & Plan Note (Signed)
Advised him with COPD that he is going to cough and have mucus Continue Breztri, Albuterol and DuoNebs Can take Mucinex 600 mg daily as needed

## 2021-11-16 ENCOUNTER — Telehealth: Payer: Self-pay

## 2021-11-16 NOTE — Telephone Encounter (Signed)
Nathaniel Little as he has not attended class since 10/21/21; [name] mentioned last week he would be back 11/16/21. During today's call [name] mentioned just waking up since he went to bed at 3 am, he reports he will be back 11/18/21.

## 2021-11-17 LAB — COMPLETE METABOLIC PANEL WITH GFR
AG Ratio: 1.3 (calc) (ref 1.0–2.5)
ALT: 40 U/L (ref 9–46)
AST: 29 U/L (ref 10–35)
Albumin: 3.8 g/dL (ref 3.6–5.1)
Alkaline phosphatase (APISO): 137 U/L (ref 35–144)
BUN/Creatinine Ratio: 17 (calc) (ref 6–22)
BUN: 29 mg/dL — ABNORMAL HIGH (ref 7–25)
CO2: 22 mmol/L (ref 20–32)
Calcium: 9.6 mg/dL (ref 8.6–10.3)
Chloride: 99 mmol/L (ref 98–110)
Creat: 1.7 mg/dL — ABNORMAL HIGH (ref 0.70–1.28)
Globulin: 2.9 g/dL (calc) (ref 1.9–3.7)
Glucose, Bld: 455 mg/dL — ABNORMAL HIGH (ref 65–139)
Potassium: 4.5 mmol/L (ref 3.5–5.3)
Sodium: 133 mmol/L — ABNORMAL LOW (ref 135–146)
Total Bilirubin: 0.5 mg/dL (ref 0.2–1.2)
Total Protein: 6.7 g/dL (ref 6.1–8.1)
eGFR: 41 mL/min/{1.73_m2} — ABNORMAL LOW (ref >=60)

## 2021-11-17 LAB — CBC
HCT: 41.7 % (ref 38.5–50.0)
Hemoglobin: 13.8 g/dL (ref 13.2–17.1)
MCH: 30.1 pg (ref 27.0–33.0)
MCHC: 33.1 g/dL (ref 32.0–36.0)
MCV: 90.8 fL (ref 80.0–100.0)
MPV: 12.1 fL (ref 7.5–12.5)
Platelets: 183 10*3/uL (ref 140–400)
RBC: 4.59 10*6/uL (ref 4.20–5.80)
RDW: 13.3 % (ref 11.0–15.0)
WBC: 11.2 10*3/uL — ABNORMAL HIGH (ref 3.8–10.8)

## 2021-11-17 LAB — MICROALBUMIN / CREATININE URINE RATIO
Creatinine, Urine: 53 mg/dL (ref 20–320)
Microalb Creat Ratio: 113 mcg/mg creat — ABNORMAL HIGH (ref <30)
Microalb, Ur: 6 mg/dL

## 2021-11-17 LAB — LIPID PANEL
Cholesterol: 160 mg/dL (ref <200)
HDL: 39 mg/dL — ABNORMAL LOW (ref >=40)
LDL Cholesterol (Calc): 87 mg/dL (calc)
Non-HDL Cholesterol (Calc): 121 mg/dL (calc) (ref <130)
Total CHOL/HDL Ratio: 4.1 (calc) (ref <5.0)
Triglycerides: 258 mg/dL — ABNORMAL HIGH (ref <150)

## 2021-11-18 MED ORDER — LISINOPRIL 5 MG PO TABS: 5.0000 mg | ORAL_TABLET | Freq: Every day | ORAL | 0 refills | Status: DC

## 2021-11-18 MED ORDER — ATORVASTATIN CALCIUM 10 MG PO TABS: 10.0000 mg | ORAL_TABLET | Freq: Every day | ORAL | 2 refills | Status: DC

## 2021-11-18 NOTE — Addendum Note (Signed)
Addended by: Jearld Fenton on: 11/18/2021 06:03 PM   Modules accepted: Orders

## 2021-11-23 DIAGNOSIS — J449 Chronic obstructive pulmonary disease, unspecified: Secondary | ICD-10-CM | POA: Diagnosis not present

## 2021-11-23 DIAGNOSIS — I5030 Unspecified diastolic (congestive) heart failure: Secondary | ICD-10-CM | POA: Diagnosis not present

## 2021-11-24 ENCOUNTER — Encounter: Payer: Self-pay | Admitting: Internal Medicine

## 2021-11-24 ENCOUNTER — Ambulatory Visit (INDEPENDENT_AMBULATORY_CARE_PROVIDER_SITE_OTHER): Payer: Medicare Other | Admitting: Internal Medicine

## 2021-11-24 ENCOUNTER — Encounter: Payer: Self-pay | Admitting: *Deleted

## 2021-11-24 ENCOUNTER — Other Ambulatory Visit: Payer: Self-pay

## 2021-11-24 VITALS — BP 119/67 | HR 67 | Temp 97.1°F | Wt 208.0 lb

## 2021-11-24 DIAGNOSIS — E1142 Type 2 diabetes mellitus with diabetic polyneuropathy: Secondary | ICD-10-CM

## 2021-11-24 DIAGNOSIS — L84 Corns and callosities: Secondary | ICD-10-CM

## 2021-11-24 DIAGNOSIS — Z6830 Body mass index (BMI) 30.0-30.9, adult: Secondary | ICD-10-CM

## 2021-11-24 DIAGNOSIS — E6609 Other obesity due to excess calories: Secondary | ICD-10-CM | POA: Diagnosis not present

## 2021-11-24 DIAGNOSIS — J449 Chronic obstructive pulmonary disease, unspecified: Secondary | ICD-10-CM

## 2021-11-24 DIAGNOSIS — N1832 Chronic kidney disease, stage 3b: Secondary | ICD-10-CM | POA: Diagnosis not present

## 2021-11-24 NOTE — Patient Instructions (Signed)
Diabetic Neuropathy Diabetic neuropathy refers to nerve damage that is caused by diabetes. Over time, people with diabetes can develop nerve damage throughout the body. There are several types of diabetic neuropathy: Peripheral neuropathy. This is the most common type of diabetic neuropathy. It damages the nerves that carry signals between the spinal cord and other parts of the body (peripheral nerves). This usually affects nerves in the feet, legs, hands, and arms. Autonomic neuropathy. This type causes damage to nerves that control involuntary functions (autonomic nerves). Involuntary functions are functions of the body that you do not control. They include heartbeat, body temperature, blood pressure, urination, digestion, sweating, sexual function, or response to changes in blood glucose. Focal neuropathy. This type of nerve damage affects one area of the body, such as an arm, a leg, or the face. The injury may involve one nerve or a small group of nerves. Focal neuropathy can be painful and unpredictable. It occurs most often in older adults with diabetes. This often develops suddenly, but usually improves over time and does not cause long-term problems. Proximal neuropathy. This type of nerve damage affects the nerves of the thighs, hips, buttocks, or legs. It causes severe pain, weakness, and muscle death (atrophy), usually in the thigh muscles. It is more common among older men and people who have type 2 diabetes. The length of recovery time may vary. What are the causes? Peripheral, autonomic, and focal neuropathies are caused by diabetes that is not well controlled with treatment. The cause of proximal neuropathy is not known, but it may be caused by inflammation related to uncontrolled blood glucose levels. What are the signs or symptoms? Peripheral neuropathy Peripheral neuropathy develops slowly over time. When the nerves of the feet and legs no longer work, you may experience: Burning,  stabbing, or aching pain in the legs or feet. Pain or cramping in the legs or feet. Loss of feeling (numbness) and inability to feel pressure or pain in the feet. This can lead to: Thick calluses or sores on areas of constant pressure. Ulcers. Reduced ability to feel temperature changes. Foot deformities. Muscle weakness. Loss of balance or coordination. Autonomic neuropathy The symptoms of autonomic neuropathy vary depending on which nerves are affected. Symptoms may include: Problems with digestion, such as: Nausea or vomiting. Poor appetite. Bloating. Diarrhea or constipation. Trouble swallowing. Losing weight without trying to. Problems with the heart, blood, and lungs, such as: Dizziness, especially when standing up. Fainting. Shortness of breath. Irregular heartbeat. Bladder problems, such as: Trouble starting or stopping urination. Leaking urine. Trouble emptying the bladder. Urinary tract infections (UTIs). Problems with other body functions, such as: Sweat. You may sweat too much or too little. Temperature. You might get hot easily. Or, you might feel cold more than usual. Sexual function. Men may not be able to get or maintain an erection. Women may have vaginal dryness and difficulty with arousal. Focal neuropathy Symptoms affect only one area of the body. Common symptoms include: Numbness. Tingling. Burning pain. Prickling feeling. Very sensitive skin. Weakness. Inability to move (paralysis). Muscle twitching. Muscles getting smaller (wasting). Poor coordination. Double or blurred vision. Proximal neuropathy Sudden, severe pain in the hip, thigh, or buttocks. Pain may spread from the back into the legs (sciatica). Pain and numbness in the arms and legs. Tingling. Loss of bladder control or bowel control. Weakness and wasting of thigh muscles. Difficulty getting up from a seated position. Abdominal swelling. Unexplained weight loss. How is this  diagnosed? Diagnosis varies depending on the type   of neuropathy your health care provider suspects. Peripheral neuropathy Your health care provider will do a neurologic exam. This exam checks your reflexes, how you move, and what you can feel. You may have other tests, such as: Blood tests. Tests of the fluid that surrounds the spinal cord (lumbar puncture). CT scan. MRI. Checking the nerves that control muscles (electromyogram, or EMG). Checking how quickly signals pass through your nerves (nerve conduction study). Checking a small piece of a nerve using a microscope (biopsy). Autonomic neuropathy You may have tests, such as: Tests to measure your blood pressure and heart rate. You may be secured to an exam table that moves you from a lying position to an upright position (table tilt test). Breathing tests to check your lungs. Tests to check how food moves through the digestive system (gastric emptying tests). Blood, sweat, or urine tests. Ultrasound of your bladder. Spinal fluid tests. Focal neuropathy This condition may be diagnosed with: A neurologic exam. CT scan. MRI. EMG. Nerve conduction study. Proximal neuropathy There is no test to diagnose this type of neuropathy. You may have tests to rule out other possible causes of this type of neuropathy. Tests may include: X-rays of your spine and lumbar region. Lumbar puncture. MRI. How is this treated? The goal of treatment is to keep nerve damage from getting worse. Treatment may include: Following your diabetes management plan. This will help keep your blood glucose level and your A1C level within your target range. This is the most important treatment. Using prescription pain medicine. Follow these instructions at home: Diabetes management Follow your diabetes management plan as told by your health care provider. Check your blood glucose levels. Keep your blood glucose in your target range. Have your A1C level checked at  least two times a year, or as often as told. Take over the counter and prescription medicines only as told by your health care provider. This includes insulin and diabetes medicine.  Lifestyle  Do not use any products that contain nicotine or tobacco, such as cigarettes, e-cigarettes, and chewing tobacco. If you need help quitting, ask your health care provider. Be physically active every day. Include strength training and balance exercises. Follow a healthy meal plan. Work with your health care provider to manage your blood pressure. General instructions Ask your health care provider if the medicine prescribed to you requires you to avoid driving or using machinery. Check your skin and feet every day for cuts, bruises, redness, blisters, or sores. Keep all follow-up visits. This is important. Contact a health care provider if: You have burning, stabbing, or aching pain in your legs or feet. You are unable to feel pressure or pain in your feet. You develop problems with digestion, such as: Nausea. Vomiting. Bloating. Constipation. Diarrhea. Abdominal pain. You have difficulty with urination, such as: Inability to control when you urinate (incontinence). Inability to completely empty the bladder (retention). You feel as if your heart is racing (palpitations). You feel dizzy, weak, or faint when you stand up. Get help right away if: You cannot urinate. You have sudden weakness or loss of coordination. You have trouble speaking. You have pain or pressure in your chest. You have an irregular heartbeat. You have sudden inability to move a part of your body. These symptoms may represent a serious problem that is an emergency. Do not wait to see if the symptoms will go away. Get medical help right away. Call your local emergency services (911 in the [place] Do not drive yourself to   the hospital. Summary Diabetic neuropathy is nerve damage that is caused by diabetes. It can cause numbness  and pain in the arms, legs, digestive tract, heart, and other body systems. This condition is treated by keeping your blood glucose level and your A1C level within your target range. This can help prevent neuropathy from getting worse. Check your skin and feet every day for cuts, bruises, redness, blisters, or sores. Do not use any products that contain nicotine or tobacco, such as cigarettes, e-cigarettes, and chewing tobacco. This information is not intended to replace advice given to you by your health care provider. Make sure you discuss any questions you have with your health care provider. Document Revised: 01/31/2020 Document Reviewed: 01/31/2020 Elsevier Patient Education  2022 [employer] Inc[employer]  

## 2021-11-24 NOTE — Progress Notes (Signed)
Subjective:    Patient ID: Nathaniel Little, male    DOB: [birth year], 76 y.o.   MRN: [id number]  HPI  Patient presents to clinic today with complaint of right foot pain.  This has been an ongoing issue. He has a callus of his right foot which was recently debrided by Dr. Prudence Davidson 2/2. There was an x-ray ordered 2/2 by Dr. Prudence Davidson however this was not done. He reports diabetic neuropathy but has not been able to get the forms to get his diabetic shoes.  He also reports his sugars have been > 400. He is currently on Metformin 1000 mg BID, Lantus 22 units at bedtime. He is on Gabapentin for management of peripheral neuropathy and Lisinopril for renal protection, hx of CKD. He has been referred to nephrology but has not heard anything about this appt. His last A1C was 10.9 %, LDL 87, triglycerides 258, creatinine 1.7, GFR 41.  Review of Systems     Past Medical History:  Diagnosis Date   (HFpEF) heart failure with preserved ejection fraction ([place])    a. 08/2017 Echo: EF 60-65%, Gr1 DD.   Arthritis    COPD (chronic obstructive pulmonary disease) ([place])    Coronary artery disease 2021   Depression    Diabetes mellitus without complication (H[facility])    Hypertension    Incidental pulmonary nodule, greater than or equal to 64mm 03/11/2020   Left lower lobe - suspicious for bronchogenic neoplasm   Lymphedema    Morbid obesity ([place])    Prostate cancer ([place]) 03/2013   ? 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 18 g 1   aspirin EC 81 MG  tablet Take 81 mg by mouth daily.     atorvastatin (LIPITOR) 10 MG tablet Take 1 tablet (10 mg total) by mouth daily. 30 tablet 2   BREZTRI AEROSPHERE 160-9-4.8 MCG/ACT AERO INHALE 2 PUFFS INTO THE LUNGS TWICE DAILY 10.7 g 0   calcium-vitamin D (OSCAL WITH D) 250-125 MG-UNIT tablet Take 1 tablet by mouth daily.     carvedilol (COREG) 6.25 MG tablet Take 1 tablet (6.25 mg total) by mouth 2 (two) times daily with a meal. 180 tablet 3   cloNIDine (CATAPRES) 0.1 MG tablet TAKE 1 TABLET BY MOUTH TWICE DAILY 60 tablet 1   ezetimibe (ZETIA) 10 MG tablet Take 1 tablet (10 mg total) by mouth daily. 90 tablet 3   fluticasone (FLONASE) 50 MCG/ACT nasal spray      gabapentin (NEURONTIN) 100 MG capsule Take 1-2 capsules (100-200 mg total) by mouth at bedtime. 180 capsule 1   glucose blood (ACCU-CHEK GUIDE) test strip Check blood sugar twice daily 100 each 12   insulin glargine (LANTUS) 100 unit/mL SOPN Inject 22 Units into the skin at bedtime.     ipratropium-albuterol (DUONEB) 0.5-2.5 (3) MG/3ML SOLN Take 3 mLs by nebulization every 6 (six) hours as needed (Asthma). 360 mL 12   ketoconazole (NIZORAL) 2 % cream Apply  1 application topically daily. 15 g 0   lisinopril (ZESTRIL) 5 MG tablet Take 1 tablet (5 mg total) by mouth daily. 90 tablet 0   metFORMIN (GLUCOPHAGE) 500 MG tablet Take 1,000 mg by mouth in the morning and at bedtime.     pantoprazole (PROTONIX) 40 MG tablet Take 1 tablet (40 mg total) by mouth 2 (two) times daily. 60 tablet 11   potassium chloride (KLOR-CON) 10 MEQ tablet Take 3 tablets (30 mEq total) by mouth daily. 90 tablet 6   sennosides-docusate sodium (SENOKOT-S) 8.6-50 MG tablet Take 1 tablet by mouth in the morning and at bedtime. 180 tablet 0   torsemide (DEMADEX) 10 MG tablet Take 1 tablet (10 mg total) by mouth daily. 30 tablet 6   No current facility-administered medications for this visit.    Allergies  Allergen Reactions   Codeine Itching    Family History  Problem  Relation Age of Onset   Breast cancer Mother    Cancer Father        Black Lung   Cerebral palsy Brother    Tuberculosis Paternal Uncle    Prostate cancer Neg Hx    Kidney cancer Neg Hx    Bladder Cancer Neg Hx     Social History   Socioeconomic History   Marital status: Widowed    Spouse name: Not on file   Number of children: 2   Years of education: Not on file   Highest education level: Not on file  Occupational History   Occupation: retired -Patent attorney  Tobacco Use   Smoking status: Every Day    Packs/day: 4.00    Years: 68.00    Pack years: 272.00    Types: Cigarettes   Smokeless tobacco: Former    Types: Chew, Snuff    Quit date: 2000   Tobacco comments:    2-3 cigs a day   Vaping Use   Vaping Use: Never used  Substance and Sexual Activity   Alcohol use: Not Currently    Alcohol/week: 0.0 - 1.0 standard drinks    Comment: quit 40years ago   Drug use: No   Sexual activity: Not Currently  Other Topics Concern   Not on file  Social History Narrative   Living at [place] Strain: Low Risk    Difficulty of Paying Living Expenses: Not hard at all  Food Insecurity: No Food Insecurity   Worried About Charity fundraiser in the Last Year: Never true   [place] in the Last Year: Never true  Transportation Needs: No Transportation Needs   Lack of Transportation (Medical): No   Lack of Transportation (Non-Medical): No  Physical Activity: Inactive   Days of Exercise per Week: 0 days   Minutes of Exercise per Session: 0 min  Stress: Stress Concern Present   Feeling of Stress : To some extent  Social Connections: Moderately Isolated   Frequency of Communication with Friends and Family: More than three times a week   Frequency of Social Gatherings with Friends and Family: More than three times a week   Attends Religious Services: More than 4 times per year   Active Member of [employer] or  Organizations: No   Attends Archivist Meetings: Never   Marital Status: Widowed  Intimate Partner Violence: Not At Risk   Fear of Current or Ex-Partner: No   Emotionally Abused: No   Physically Abused:  No   Sexually Abused: No     Constitutional: Pt reports fatigue. Denies fever, malaise, headache or abrupt weight changes.  HEENT: Denies eye pain, eye redness, ear pain, ringing in the ears, wax buildup, runny nose, nasal congestion, bloody nose, or sore throat. Respiratory:Pt reports chronic cough, shortness of breath with exertion. Denies difficulty breathing.   Cardiovascular: Denies chest pain, chest tightness, palpitations or swelling in the hands or feet.  GU: Pt reports urinary frequency. Denies urgency, pain with urination, burning sensation, blood in urine, odor or discharge. Musculoskeletal: Patient reports right foot pain.  Denies decrease in range of motion, difficulty with gait, muscle pain or joint swelling.  Skin: Pt reports callus of right foot. Denies redness, rashes, or ulcercations.  Neurological: Pt reports peripheral neuropathy, difficulty with balance. Denies dizziness, difficulty with memory, difficulty with speech or problems with coordination.    No other specific complaints in a complete review of systems (except as listed in HPI above).  Objective:   Physical Exam  BP 119/67 (BP Location: Right Arm, Patient Position: Sitting, Cuff Size: Large)    Pulse 67    Temp (!) 97.1 F (36.2 C) (Temporal)    Wt 208 lb (94.3 kg)    SpO2 98%    BMI 30.72 kg/m   Wt Readings from Last 3 Encounters:  11/13/21 211 lb (95.7 kg)  10/24/21 214 lb (97.1 kg)  08/17/21 208 lb 4.8 oz (94.5 kg)    General: Appears his stated age, obese, chronically ill appearing, in NAD. Skin: Warm, dry and intact. Callus noted sub 2/3 distal plantar metatarsals, right foot. No ulcers noted. Mycotic toenails bilaterally. Cardiovascular: Normal rate and rhythm. S1,S2 noted.  No  murmur, rubs or gallops noted.  Pulmonary/Chest: Normal effort and diminished breath sounds. No respiratory distress. No wheezes, rales or ronchi noted.  Musculoskeletal:Gait slow, steady with use of rolling walker. Neurological: Alert and oriented. Trouble with recall. Sensation decreased to BLE.   BMET    Component Value Date/Time   NA 133 (L) 11/13/2021 1451   NA 141 08/03/2021 1323   K 4.5 11/13/2021 1451   CL 99 11/13/2021 1451   CO2 22 11/13/2021 1451   GLUCOSE 455 (H) 11/13/2021 1451   BUN 29 (H) 11/13/2021 1451   BUN 24 08/03/2021 1323   CREATININE 1.70 (H) 11/13/2021 1451   CALCIUM 9.6 11/13/2021 1451   GFRNONAA 46 (L) 10/24/2021 0107   GFRNONAA 70 12/06/2019 1420   GFRAA 55 (L) 11/07/2020 1534   GFRAA 81 12/06/2019 1420    Lipid Panel     Component Value Date/Time   CHOL 160 11/13/2021 1451   CHOL 149 02/08/2020 1228   TRIG 258 (H) 11/13/2021 1451   HDL 39 (L) 11/13/2021 1451   HDL 38 (L) 02/08/2020 1228   CHOLHDL 4.1 11/13/2021 1451   LDLCALC 87 11/13/2021 1451    CBC    Component Value Date/Time   WBC 11.2 (H) 11/13/2021 1451   RBC 4.59 11/13/2021 1451   HGB 13.8 11/13/2021 1451   HGB 13.2 07/24/2021 1543   HCT 41.7 11/13/2021 1451   HCT 38.8 07/24/2021 1543   PLT 183 11/13/2021 1451   PLT 194 07/24/2021 1543   MCV 90.8 11/13/2021 1451   MCV 93 07/24/2021 1543   MCH 30.1 11/13/2021 1451   MCHC 33.1 11/13/2021 1451   RDW 13.3 11/13/2021 1451   RDW 13.4 07/24/2021 1543   LYMPHSABS 1.3 06/29/2021 1316   LYMPHSABS 1.1 02/08/2020 1228   MONOABS  0.6 06/29/2021 1316   EOSABS 0.3 06/29/2021 1316   EOSABS 0.1 02/08/2020 1228   BASOSABS 0.1 06/29/2021 1316   BASOSABS 0.0 02/08/2020 1228    Hgb A1C Lab Results  Component Value Date   HGBA1C 10.9 (A) 11/13/2021           Assessment & Plan:   Callus of Right Foot, DM 2 with Peripheral Neuropathy, CKD:  I advised him xray of right foot has been ordered by Dr. Prudence Davidson and he needs to follow up  at the podiatrist office to have that done as our xray tech is out today Advised him to follow up with Dr. Prudence Davidson regarding diabetic shoes who will then refer him to the Pedorithist within the podiatry practive Continue Metformin 1000 mg BID, Gabapentin and Lisinopril Increase Lantus to 25 units at bedtime Update me in 1 week with fasting sugar readings Encouraged low carb diet  Return precautions discussed Webb Silversmith, NP This visit occurred during the SARS-CoV-2 public health emergency.  Safety protocols were in place, including screening questions prior to the visit, additional usage of staff PPE, and extensive cleaning of exam room while observing appropriate contact time as indicated for disinfecting solutions.

## 2021-11-25 ENCOUNTER — Encounter: Payer: Self-pay | Admitting: *Deleted

## 2021-11-25 DIAGNOSIS — J449 Chronic obstructive pulmonary disease, unspecified: Secondary | ICD-10-CM

## 2021-11-25 DIAGNOSIS — I5030 Unspecified diastolic (congestive) heart failure: Secondary | ICD-10-CM | POA: Diagnosis not present

## 2021-11-25 NOTE — Progress Notes (Signed)
Pulmonary Individual Treatment Plan  Patient Details  Name: Nathaniel Little MRN: [id number] Date of Birth: [birth year] Referring Provider:   Flowsheet Row Pulmonary Rehab from 08/17/2021 in [facility] Cardiac and Pulmonary Rehab  Referring Provider Dr. Patsey Berthold       Initial Encounter Date:  Flowsheet Row Pulmonary Rehab from 08/17/2021 in [facility] Cardiac and Pulmonary Rehab  Date 08/17/21       Visit Diagnosis: Chronic obstructive pulmonary disease, unspecified COPD type ([place])  Patient's Home Medications on Admission:  Current Outpatient Medications:    albuterol (VENTOLIN HFA) 108 (90 Base) MCG/ACT inhaler, Inhale 2 puffs into the lungs every 6 (six) hours as needed for wheezing or shortness of breath., Disp: 18 g, Rfl: 1   aspirin EC 81 MG tablet, Take 81 mg by mouth daily., Disp: , Rfl:    atorvastatin (LIPITOR) 10 MG tablet, Take 1 tablet (10 mg total) by mouth daily., Disp: 30 tablet, Rfl: 2   BREZTRI AEROSPHERE 160-9-4.8 MCG/ACT AERO, INHALE 2 PUFFS INTO THE LUNGS TWICE DAILY, Disp: 10.7 g, Rfl: 0   calcium-vitamin D (OSCAL WITH D) 250-125 MG-UNIT tablet, Take 1 tablet by mouth daily., Disp: , Rfl:    carvedilol (COREG) 6.25 MG tablet, Take 1 tablet (6.25 mg total) by mouth 2 (two) times daily with a meal., Disp: 180 tablet, Rfl: 3   cloNIDine (CATAPRES) 0.1 MG tablet, TAKE 1 TABLET BY MOUTH TWICE DAILY, Disp: 60 tablet, Rfl: 1   ezetimibe (ZETIA) 10 MG tablet, Take 1 tablet (10 mg total) by mouth daily., Disp: 90 tablet, Rfl: 3   fluticasone (FLONASE) 50 MCG/ACT nasal spray, , Disp: , Rfl:    gabapentin (NEURONTIN) 100 MG capsule, Take 1-2 capsules (100-200 mg total) by mouth at bedtime., Disp: 180 capsule, Rfl: 1   glucose blood (ACCU-CHEK GUIDE) test strip, Check blood sugar twice daily, Disp: 100 each, Rfl: 12   insulin glargine (LANTUS) 100 unit/mL SOPN, Inject 22 Units into the skin at bedtime., Disp: , Rfl:    ipratropium-albuterol (DUONEB) 0.5-2.5 (3) MG/3ML SOLN, Take 3 mLs by  nebulization every 6 (six) hours as needed (Asthma)., Disp: 360 mL, Rfl: 12   ketoconazole (NIZORAL) 2 % cream, Apply 1 application topically daily., Disp: 15 g, Rfl: 0   lisinopril (ZESTRIL) 5 MG tablet, Take 1 tablet (5 mg total) by mouth daily., Disp: 90 tablet, Rfl: 0   metFORMIN (GLUCOPHAGE) 500 MG tablet, Take 1,000 mg by mouth in the morning and at bedtime., Disp: , Rfl:    pantoprazole (PROTONIX) 40 MG tablet, Take 1 tablet (40 mg total) by mouth 2 (two) times daily., Disp: 60 tablet, Rfl: 11   potassium chloride (KLOR-CON) 10 MEQ tablet, Take 3 tablets (30 mEq total) by mouth daily., Disp: 90 tablet, Rfl: 6   sennosides-docusate sodium (SENOKOT-S) 8.6-50 MG tablet, Take 1 tablet by mouth in the morning and at bedtime., Disp: 180 tablet, Rfl: 0   torsemide (DEMADEX) 10 MG tablet, Take 1 tablet (10 mg total) by mouth daily., Disp: 30 tablet, Rfl: 6  Past Medical History: Past Medical History:  Diagnosis Date   (HFpEF) heart failure with preserved ejection fraction ([place])    a. 08/2017 Echo: EF 60-65%, Gr1 DD.   Arthritis    COPD (chronic obstructive pulmonary disease) ([place])    Coronary artery disease 2021   Depression    Diabetes mellitus without complication (HCC)    Hypertension    Incidental pulmonary nodule, greater than or equal to 29mm 03/11/2020   Left lower lobe -  suspicious for bronchogenic neoplasm   Lymphedema    Morbid obesity ([place])    Prostate cancer ([place]) 03/2013   ? PANCREATIC   S/P TAVR (transcatheter aortic valve replacement) 04/01/2020   s/p TAVR with a 29 mm Edwards Sapien 3 THV via the TF approach by Drs Buena Irish & Roxy Manns   Severe aortic stenosis    Sleep apnea with use of continuous positive airway pressure (CPAP)    Tobacco abuse    Tremor    HEAD    Tobacco Use: Social History   Tobacco Use  Smoking Status Every Day   Packs/day: 4.00   Years: 68.00   Pack years: 272.00   Types: Cigarettes  Smokeless Tobacco Former   Types: Chew, Snuff   Quit  date: 2000  Tobacco Comments   2-3 cigs a day     Labs: Recent Review Flowsheet Data     Labs for ITP Cardiac and Pulmonary Rehab Latest Ref Rng & Units 11/14/2020 01/06/2021 04/14/2021 05/18/2021 11/13/2021   Cholestrol <200 mg/dL - 125 - 116 160   LDLCALC mg/dL (calc) - 66 - 53 87   HDL > OR = 40 mg/dL - 36(L) - 45 39(L)   Trlycerides <150 mg/dL - 157(H) - 101 258(H)   Hemoglobin A1c 4.0 - 5.6 % 6.9(H) - - 6.6(H) 10.9(A)   PHART 7.350 - 7.450 7.432 - - - -   PCO2ART 32.0 - 48.0 mmHg 39.4 - - - -   HCO3 20.0 - 28.0 mmol/L 25.8 - 28.5(H) - -   TCO2 22 - 32 mmol/L - - - - -   ACIDBASEDEF 0.0 - 2.0 mmol/L - - - - -   O2SAT % 95.4 - 67.3 - -        Pulmonary Assessment Scores:  Pulmonary Assessment Scores     Row Name 08/17/21 1607         ADL UCSD   SOB Score total 79     Rest 4     Walk 5     Stairs 5     Bath 4     Dress 4     Shop 4       CAT Score   CAT Score 26       mMRC Score   mMRC Score 3              UCSD: Self-administered rating of dyspnea associated with activities of daily living (ADLs) 6-point scale (0 = "not at all" to 5 = "maximal or unable to do because of breathlessness")  Scoring Scores range from 0 to 120.  Minimally important difference is 5 units  CAT: CAT can identify the health impairment of COPD patients and is better correlated with disease progression.  CAT has a scoring range of zero to 40. The CAT score is classified into four groups of low (less than 10), medium (10 - 20), high (21-30) and very high (31-40) based on the impact level of disease on health status. A CAT score over 10 suggests significant symptoms.  A worsening CAT score could be explained by an exacerbation, poor medication adherence, poor inhaler technique, or progression of COPD or comorbid conditions.  CAT MCID is 2 points  mMRC: mMRC (Modified Medical Research Council) Dyspnea Scale is used to assess the degree of baseline functional disability in patients of  respiratory disease due to dyspnea. No minimal important difference is established. A decrease in score of 1 point or greater is considered a positive  change.   Pulmonary Function Assessment:   Exercise Target Goals: Exercise Program Goal: Individual exercise prescription set using results from initial 6 min walk test and THRR while considering  patients activity barriers and safety.   Exercise Prescription Goal: Initial exercise prescription builds to 30-45 minutes a day of aerobic activity, 2-3 days per week.  Home exercise guidelines will be given to patient during program as part of exercise prescription that the participant will acknowledge.  Education: Aerobic Exercise: - Group verbal and visual presentation on the components of exercise prescription. Introduces F.I.T.T principle from ACSM for exercise prescriptions.  Reviews F.I.T.T. principles of aerobic exercise including progression. Written material given at graduation. Flowsheet Row Pulmonary Rehab from 10/21/2021 in [facility] Cardiac and Pulmonary Rehab  Date 10/01/21  Educator [facility]  Instruction Review Code 1- Verbalizes Understanding       Education: Resistance Exercise: - Group verbal and visual presentation on the components of exercise prescription. Introduces F.I.T.T principle from ACSM for exercise prescriptions  Reviews F.I.T.T. principles of resistance exercise including progression. Written material given at graduation.    Education: Exercise & Equipment Safety: - Individual verbal instruction and demonstration of equipment use and safety with use of the equipment. Flowsheet Row Pulmonary Rehab from 10/21/2021 in [facility] Cardiac and Pulmonary Rehab  Date 08/17/21  Educator [facility]  Instruction Review Code 1- Verbalizes Understanding       Education: Exercise Physiology & General Exercise Guidelines: - Group verbal and written instruction with models to review the exercise physiology of the cardiovascular system and  associated critical values. Provides general exercise guidelines with specific guidelines to those with heart or lung disease.  Flowsheet Row Pulmonary Rehab from 10/21/2021 in [facility] Cardiac and Pulmonary Rehab  Education need identified 08/17/21       Education: Flexibility, Balance, Mind/Body Relaxation: - Group verbal and visual presentation with interactive activity on the components of exercise prescription. Introduces F.I.T.T principle from ACSM for exercise prescriptions. Reviews F.I.T.T. principles of flexibility and balance exercise training including progression. Also discusses the mind body connection.  Reviews various relaxation techniques to help reduce and manage stress (i.e. Deep breathing, progressive muscle relaxation, and visualization). Balance handout provided to take home. Written material given at graduation.   Activity Barriers & Risk Stratification:  Activity Barriers & Cardiac Risk Stratification - 07/31/21 1313       Activity Barriers & Cardiac Risk Stratification   Activity Barriers Joint Problems;Arthritis;Shortness of Breath;Balance Concerns    Comments Walker             6 Minute Walk:  6 Minute Walk     Row Name 08/17/21 1551         6 Minute Walk   Phase Initial     Distance 680 feet     Walk Time 6 minutes     # of Rest Breaks 0     MPH 1.29     METS 1.25     RPE 13     Perceived Dyspnea  2     VO2 Peak 4.4     Symptoms No     Resting HR 72 bpm     Resting BP 140/72     Resting Oxygen Saturation  95 %     Exercise Oxygen Saturation  during 6 min walk 93 %     Max Ex. HR 84 bpm     Max Ex. BP 162/80     2 Minute Post BP 138/80  Interval HR   1 Minute HR 85     2 Minute HR 84     3 Minute HR 84     4 Minute HR 80     5 Minute HR 70     6 Minute HR 83     2 Minute Post HR 74     Interval Heart Rate? Yes       Interval Oxygen   Interval Oxygen? Yes     Baseline Oxygen Saturation % 95 %     1 Minute Oxygen Saturation % 95  %     1 Minute Liters of Oxygen 0 L     2 Minute Oxygen Saturation % 93 %     2 Minute Liters of Oxygen 0 L     3 Minute Oxygen Saturation % 95 %     3 Minute Liters of Oxygen 0 L     4 Minute Oxygen Saturation % 94 %     4 Minute Liters of Oxygen 0 L     5 Minute Oxygen Saturation % 96 %     5 Minute Liters of Oxygen 0 L     6 Minute Oxygen Saturation % 95 %     6 Minute Liters of Oxygen 0 L     2 Minute Post Oxygen Saturation % 95 %     2 Minute Post Liters of Oxygen 0 L             Oxygen Initial Assessment:  Oxygen Initial Assessment - 09/15/21 0925       Home Oxygen   Home Oxygen Device None    Sleep Oxygen Prescription Continuous    Home Exercise Oxygen Prescription None    Home Resting Oxygen Prescription None   uses 1 L prn   Compliance with Home Oxygen Use Yes      Intervention   Short Term Goals To learn and exhibit compliance with exercise, home and travel O2 prescription;To learn and understand importance of monitoring SPO2 with pulse oximeter and demonstrate accurate use of the pulse oximeter.;To learn and understand importance of maintaining oxygen saturations>88%;To learn and demonstrate proper pursed lip breathing techniques or other breathing techniques.     Long  Term Goals Verbalizes importance of monitoring SPO2 with pulse oximeter and return demonstration;Maintenance of O2 saturations>88%;Exhibits proper breathing techniques, such as pursed lip breathing or other method taught during program session;Compliance with respiratory medication             Oxygen Re-Evaluation:  Oxygen Re-Evaluation     Row Name 09/01/21 0916 09/15/21 0925 10/15/21 1405         Program Oxygen Prescription   Program Oxygen Prescription -- -- None       Home Oxygen   Home Oxygen Device None -- Home Concentrator;E-Tanks     Sleep Oxygen Prescription Continuous -- Continuous     Liters per minute 2 2 2      Home Exercise Oxygen Prescription None -- None  PRN     Home  Resting Oxygen Prescription None -- None  PRN     Compliance with Home Oxygen Use Yes -- Yes       Goals/Expected Outcomes   Short Term Goals To learn and exhibit compliance with exercise, home and travel O2 prescription;To learn and understand importance of monitoring SPO2 with pulse oximeter and demonstrate accurate use of the pulse oximeter.;To learn and understand importance of maintaining oxygen saturations>88%;To learn and demonstrate proper pursed lip  breathing techniques or other breathing techniques.  -- To learn and understand importance of monitoring SPO2 with pulse oximeter and demonstrate accurate use of the pulse oximeter.;To learn and understand importance of maintaining oxygen saturations>88%;To learn and exhibit compliance with exercise, home and travel O2 prescription     Long  Term Goals Verbalizes importance of monitoring SPO2 with pulse oximeter and return demonstration;Maintenance of O2 saturations>88%;Exhibits proper breathing techniques, such as pursed lip breathing or other method taught during program session;Compliance with respiratory medication -- Maintenance of O2 saturations>88%;Verbalizes importance of monitoring SPO2 with pulse oximeter and return demonstration;Exhibits compliance with exercise, home  and travel O2 prescription     Comments Reviewed PLB technique with pt.  Talked about how it works and it's importance in maintaining their exercise saturations. We reiterated PLB technique and patient is still checking his oxygen at home. He does report that he uses resting oxygen as needed and is aware to stay above that 88%. He is staying compliant using his oxygen at sleep and wants to talk to his doctor to see if he can increase his liter flow as he think he will feel better doing so. Patient has had a long history of cancer. He has had a left lower lobectomy. Informed him that he should be 37 percent and above with his oxygen. He states that he wears oxygen at home PRN.  Informed him that he should watch his oxygen at home more on exertion. He verbalizes understaning of checking oxygen.     Goals/Expected Outcomes Short: Become more profiecient at using PLB.   Long: Become independent at using PLB. Short: Stay above 88% for oxygen and stay compliant to doctor's orders Long: Stay independent at PLB Short: check oxygen at home more routinely. Long: maintain oxygen saturation at home independenty.              Oxygen Discharge (Final Oxygen Re-Evaluation):  Oxygen Re-Evaluation - 10/15/21 1405       Program Oxygen Prescription   Program Oxygen Prescription None      Home Oxygen   Home Oxygen Device Home Concentrator;E-Tanks    Sleep Oxygen Prescription Continuous    Liters per minute 2    Home Exercise Oxygen Prescription None   PRN   Home Resting Oxygen Prescription None   PRN   Compliance with Home Oxygen Use Yes      Goals/Expected Outcomes   Short Term Goals To learn and understand importance of monitoring SPO2 with pulse oximeter and demonstrate accurate use of the pulse oximeter.;To learn and understand importance of maintaining oxygen saturations>88%;To learn and exhibit compliance with exercise, home and travel O2 prescription    Long  Term Goals Maintenance of O2 saturations>88%;Verbalizes importance of monitoring SPO2 with pulse oximeter and return demonstration;Exhibits compliance with exercise, home  and travel O2 prescription    Comments Patient has had a long history of cancer. He has had a left lower lobectomy. Informed him that he should be 60 percent and above with his oxygen. He states that he wears oxygen at home PRN. Informed him that he should watch his oxygen at home more on exertion. He verbalizes understaning of checking oxygen.    Goals/Expected Outcomes Short: check oxygen at home more routinely. Long: maintain oxygen saturation at home independenty.             Initial Exercise Prescription:  Initial Exercise Prescription  - 08/17/21 1500       Date of Initial Exercise RX and Referring Provider  Date 08/17/21    Referring Provider Dr. Patsey Berthold      Oxygen   Maintain Oxygen Saturation 88% or higher      NuStep   Level 1    SPM 80    Minutes 15    METs 1.25      Recumbant Elliptical   Level 1    RPM 50    Minutes 15    METs 1.25      Track   Laps 16    Minutes 15    METs 1.25      Prescription Details   Frequency (times per week) 2    Duration Progress to 30 minutes of continuous aerobic without signs/symptoms of physical distress      Intensity   THRR 40-80% of Max Heartrate 100-129    Ratings of Perceived Exertion 11-13    Perceived Dyspnea 0-4      Progression   Progression Continue to progress workloads to maintain intensity without signs/symptoms of physical distress.      Resistance Training   Training Prescription Yes    Weight 2    Reps 10-15             Perform Capillary Blood Glucose checks as needed.  Exercise Prescription Changes:   Exercise Prescription Changes     Row Name 08/17/21 1500 09/15/21 1200 10/26/21 1200         Response to Exercise   Blood Pressure (Admit) 140/72 106/62 144/80     Blood Pressure (Exercise) 162/80 140/64 162/86     Blood Pressure (Exit) 138/80 126/62 138/78     Heart Rate (Admit) 72 bpm 64 bpm 80 bpm     Heart Rate (Exercise) 84 bpm 75 bpm 99 bpm     Heart Rate (Exit) 74 bpm 70 bpm 88 bpm     Oxygen Saturation (Admit) 95 % 95 % 96 %     Oxygen Saturation (Exercise) 93 % 93 % 94 %     Oxygen Saturation (Exit) 95 % 91 % 95 %     Rating of Perceived Exertion (Exercise) 13 14 16      Perceived Dyspnea (Exercise) 2 1 3      Symptoms none none SOB     Comments 6 MWT results third day --     Duration -- Progress to 30 minutes of  aerobic without signs/symptoms of physical distress Progress to 30 minutes of  aerobic without signs/symptoms of physical distress     Intensity -- THRR unchanged THRR unchanged       Progression    Progression -- Continue to progress workloads to maintain intensity without signs/symptoms of physical distress. Continue to progress workloads to maintain intensity without signs/symptoms of physical distress.     Average METs -- 1.38 2.2       Resistance Training   Training Prescription -- Yes Yes     Weight -- 2 lb 2 lb     Reps -- 10-15 10-15       Interval Training   Interval Training -- -- No       NuStep   Level -- -- 3     Minutes -- -- 15     METs -- -- 2.3       Arm Ergometer   Level -- -- 1     Minutes -- -- 30     METs -- -- 2.3       Track   Laps -- -- 10  Minutes -- -- 10     METs -- -- 1.54       Oxygen   Maintain Oxygen Saturation -- -- 88% or higher              Exercise Comments:   Exercise Comments     Row Name 09/01/21 0915           Exercise Comments First full day of exercise!  Patient was oriented to gym and equipment including functions, settings, policies, and procedures.  Patient's individual exercise prescription and treatment plan were reviewed.  All starting workloads were established based on the results of the 6 minute walk test done at initial orientation visit.  The plan for exercise progression was also introduced and progression will be customized based on patient's performance and goals.                Exercise Goals and Review:   Exercise Goals     Row Name 08/17/21 1559             Exercise Goals   Increase Physical Activity Yes       Intervention Provide advice, education, support and counseling about physical activity/exercise needs.;Develop an individualized exercise prescription for aerobic and resistive training based on initial evaluation findings, risk stratification, comorbidities and participant's personal goals.       Expected Outcomes Short Term: Attend rehab on a regular basis to increase amount of physical activity.;Long Term: Add in home exercise to make exercise part of routine and to increase  amount of physical activity.;Long Term: Exercising regularly at least 3-5 days a week.       Increase Strength and Stamina Yes       Intervention Provide advice, education, support and counseling about physical activity/exercise needs.;Develop an individualized exercise prescription for aerobic and resistive training based on initial evaluation findings, risk stratification, comorbidities and participant's personal goals.       Expected Outcomes Short Term: Increase workloads from initial exercise prescription for resistance, speed, and METs.;Short Term: Perform resistance training exercises routinely during rehab and add in resistance training at home;Long Term: Improve cardiorespiratory fitness, muscular endurance and strength as measured by increased METs and functional capacity (6MWT)       Able to understand and use rate of perceived exertion (RPE) scale Yes       Intervention Provide education and explanation on how to use RPE scale       Expected Outcomes Short Term: Able to use RPE daily in rehab to express subjective intensity level;Long Term:  Able to use RPE to guide intensity level when exercising independently       Able to understand and use Dyspnea scale Yes       Intervention Provide education and explanation on how to use Dyspnea scale       Expected Outcomes Short Term: Able to use Dyspnea scale daily in rehab to express subjective sense of shortness of breath during exertion;Long Term: Able to use Dyspnea scale to guide intensity level when exercising independently       Knowledge and understanding of Target Heart Rate Range (THRR) Yes       Intervention Provide education and explanation of THRR including how the numbers were predicted and where they are located for reference       Expected Outcomes Short Term: Able to state/look up THRR;Long Term: Able to use THRR to govern intensity when exercising independently;Short Term: Able to use daily as guideline for intensity in rehab  Able to check pulse independently Yes       Intervention Provide education and demonstration on how to check pulse in carotid and radial arteries.;Review the importance of being able to check your own pulse for safety during independent exercise       Expected Outcomes Short Term: Able to explain why pulse checking is important during independent exercise;Long Term: Able to check pulse independently and accurately       Understanding of Exercise Prescription Yes       Intervention Provide education, explanation, and written materials on patient's individual exercise prescription       Expected Outcomes Short Term: Able to explain program exercise prescription;Long Term: Able to explain home exercise prescription to exercise independently                Exercise Goals Re-Evaluation :  Exercise Goals Re-Evaluation     Row Name 09/01/21 0915 09/15/21 0923 10/26/21 1233 11/24/21 1521       Exercise Goal Re-Evaluation   Exercise Goals Review Increase Physical Activity;Able to understand and use rate of perceived exertion (RPE) scale;Knowledge and understanding of Target Heart Rate Range (THRR);Understanding of Exercise Prescription;Able to understand and use Dyspnea scale;Able to check pulse independently;Increase Strength and Stamina Increase Strength and Stamina;Increase Physical Activity Increase Strength and Stamina;Increase Physical Activity --    Comments Reviewed RPE and dyspnea scales, THR and program prescription with pt today.  Pt voiced understanding and was given a copy of goals to take home. Jep is only on session 5 and is not ready to go over home exercise yet. He is walking a little bit at home and I encouraged him to think about what kind of home exercise he will be comfortable doing. We talked about the [facility] which he will think about. Kennieth is doing well in rehab. He enjoys the arm crank and tolerated it well for the full 30 minutes last session.  He was also able to walk 10  laps on the track and increased to level 3 on the T4 Nustep. Unforuntately, he just found out he tested positive for covid and will have to be out for another 2 weeks. Staff will encourage to pick up where he left off. Out since 10/21/21    Expected Outcomes Short: Use RPE daily to regulate intensity. Long: Follow program prescription in THR. Short: Continue walking home and think about home exercise Long: Exercise independently at home at appropriate prescription Short: Continue working up loads as tolerated and increase laps on track Long: Increase overall MET level --             Discharge Exercise Prescription (Final Exercise Prescription Changes):  Exercise Prescription Changes - 10/26/21 1200       Response to Exercise   Blood Pressure (Admit) 144/80    Blood Pressure (Exercise) 162/86    Blood Pressure (Exit) 138/78    Heart Rate (Admit) 80 bpm    Heart Rate (Exercise) 99 bpm    Heart Rate (Exit) 88 bpm    Oxygen Saturation (Admit) 96 %    Oxygen Saturation (Exercise) 94 %    Oxygen Saturation (Exit) 95 %    Rating of Perceived Exertion (Exercise) 16    Perceived Dyspnea (Exercise) 3    Symptoms SOB    Duration Progress to 30 minutes of  aerobic without signs/symptoms of physical distress    Intensity THRR unchanged      Progression   Progression Continue to progress workloads to maintain intensity  without signs/symptoms of physical distress.    Average METs 2.2      Resistance Training   Training Prescription Yes    Weight 2 lb    Reps 10-15      Interval Training   Interval Training No      NuStep   Level 3    Minutes 15    METs 2.3      Arm Ergometer   Level 1    Minutes 30    METs 2.3      Track   Laps 10    Minutes 10    METs 1.54      Oxygen   Maintain Oxygen Saturation 88% or higher             Nutrition:  Target Goals: Understanding of nutrition guidelines, daily intake of sodium <1583m, cholesterol <2019m calories 30% from fat and 7%  or less from saturated fats, daily to have 5 or more servings of fruits and vegetables.  Education: All About Nutrition: -Group instruction provided by verbal, written material, interactive activities, discussions, models, and posters to present general guidelines for heart healthy nutrition including fat, fiber, MyPlate, the role of sodium in heart healthy nutrition, utilization of the nutrition label, and utilization of this knowledge for meal planning. Follow up email sent as well. Written material given at graduation.   Biometrics:  Pre Biometrics - 08/17/21 1600       Pre Biometrics   Height 5' 9.5" (1.765 m)    Weight 208 lb 4.8 oz (94.5 kg)    BMI (Calculated) 30.33    Single Leg Stand 0 seconds              Nutrition Therapy Plan and Nutrition Goals:  Nutrition Therapy & Goals - 09/15/21 1019       Nutrition Therapy   Diet Heart healthy, low Na, T2DM MNT, pulmonary MNT.    Protein (specify units) 75g    Fiber 30 grams    Whole Grain Foods 3 servings    Saturated Fats 12 max. grams    Fruits and Vegetables 8 servings/day    Sodium 1.5 grams      Personal Nutrition Goals   Nutrition Goal ST: track indigestion with last time eating, bedtime, and trigger foods LT: prevent indigestion    Comments [age].o. M admitted to rehab for COPD presents with HTN, CHF, GERD, T2DM, HLD, iron deficiency anemia. PMH of . Relevant medications include calcium/vit D, coreg, zeita, lantus, metformin, protonix, K+, torsemide. [employer] days a week for lunch; spaghetti and meatballs, black eyed peas, peas, sliced peaches and jello. He makes his meals for the weekend - chili, shrimp, ham, chicken, tu[place]ried rice. Most days he doesn't feel like eating much so he sticks with some fruit and peanut butter. He reports that he has gas with eating - he feels coke helps with his indigestion. He gets indigestion close to bedtime.  The only vegetable he won't eat  is spinach. He reports that lunch is filling and he will have fruit (variety) and some peanut butter crackers. He drinks water mostly. He does not have teeth so he has difficulty eating hard things. He takes his medicine at 830/9am - with a cracker or 2. he takes 20 units of Lantus at night - he checks his BG in am and pm (he reports is 90-200 depending on the day). He tries to eat foods that are sugar free aside  from some peanut butter crackers. 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 Reviewed some key MNT for diabetes and pulmonary nutrition. Will review education as he goes through the program.      Intervention Plan   Intervention Prescribe, educate and counsel regarding individualized specific dietary modifications aiming towards targeted core components such as weight, hypertension, lipid management, diabetes, heart failure and other comorbidities.    Expected Outcomes Short Term Goal: Understand basic principles of dietary content, such as calories, fat, sodium, cholesterol and nutrients.;Short Term Goal: A plan has been developed with personal nutrition goals set during dietitian appointment.;Long Term Goal: Adherence to prescribed nutrition plan.             Nutrition Assessments:  MEDIFICTS Score Key: ?70 Need to make dietary changes  40-70 Heart Healthy Diet ? 40 Therapeutic Level Cholesterol Diet  Flowsheet Row Pulmonary Rehab from 08/17/2021 in [facility] Cardiac and Pulmonary Rehab  Picture Your Plate Total Score on Admission 63      Picture Your Plate Scores: <83 Unhealthy dietary pattern with much room for improvement. 41-50 Dietary pattern unlikely to meet recommendations for good health and room for improvement. 51-60 More healthful dietary pattern, with some room for improvement.  >60 Healthy dietary pattern, although there may be some specific behaviors that could be improved.   Nutrition Goals Re-Evaluation:  Nutrition Goals  Re-Evaluation     [place] Name 10/15/21 1421             Goals   Nutrition Goal Do smaller snacks throughout the day.       Comment Patient was informed on why it is important to maintain a balanced diet when dealing with Respiratory issues. Explained that it takes a lot of energy to breath and when they are short of breath often they will need to have a good diet to help keep up with the calories they are expending for breathing.       Expected Outcome Short: Choose and plan snacks accordingly to patients caloric intake to improve breathing. Long: Maintain a diet independently that meets their caloric intake to aid in daily shortness of breath.                Nutrition Goals Discharge (Final Nutrition Goals Re-Evaluation):  Nutrition Goals Re-Evaluation - 10/15/21 1421       Goals   Nutrition Goal Do smaller snacks throughout the day.    Comment Patient was informed on why it is important to maintain a balanced diet when dealing with Respiratory issues. Explained that it takes a lot of energy to breath and when they are short of breath often they will need to have a good diet to help keep up with the calories they are expending for breathing.    Expected Outcome Short: Choose and plan snacks accordingly to patients caloric intake to improve breathing. Long: Maintain a diet independently that meets their caloric intake to aid in daily shortness of breath.             Psychosocial: Target Goals: Acknowledge presence or absence of significant depression and/or stress, maximize coping skills, provide positive support system. Participant is able to verbalize types and ability to use techniques and skills needed for reducing stress and depression.   Education: Stress, Anxiety, and Depression - Group verbal and visual presentation to define topics covered.  Reviews how body is impacted by stress, anxiety, and depression.  Also discusses healthy ways to  reduce stress and to treat/manage  anxiety and depression.  Written material given at graduation.   Education: Sleep Hygiene -Provides group verbal and written instruction about how sleep can affect your health.  Define sleep hygiene, discuss sleep cycles and impact of sleep habits. Review good sleep hygiene tips.    Initial Review & Psychosocial Screening:  Initial Psych Review & Screening - 07/31/21 1328       Initial Review   Current issues with Current Stress Concerns    Source of Stress Concerns Family;Transportation      Family Dynamics   Good Support System? Yes   friend   Concerns Inappropriate over/under dependence on family/friends      Barriers   Psychosocial barriers to participate in program There are no identifiable barriers or psychosocial needs.;The patient should benefit from training in stress management and relaxation.      Screening Interventions   Interventions Encouraged to exercise;Provide feedback about the scores to participant;To provide support and resources with identified psychosocial needs    Expected Outcomes Short Term goal: Utilizing psychosocial counselor, staff and physician to assist with identification of specific Stressors or current issues interfering with healing process. Setting desired goal for each stressor or current issue identified.;Long Term Goal: Stressors or current issues are controlled or eliminated.;Short Term goal: Identification and review with participant of any Quality of Life or Depression concerns found by scoring the questionnaire.;Long Term goal: The participant improves quality of Life and PHQ9 Scores as seen by post scores and/or verbalization of changes             Quality of Life Scores:  Scores of 19 and below usually indicate a poorer quality of life in these areas.  A difference of  2-3 points is a clinically meaningful difference.  A difference of 2-3 points in the total score of the Quality of Life Index has been associated with significant  improvement in overall quality of life, self-image, physical symptoms, and general health in studies assessing change in quality of life.  PHQ-9: Recent Review Flowsheet Data     Depression screen [facility] 2/9 09/22/2021 08/17/2021 05/18/2021 12/09/2020 03/31/2020   Decreased Interest 0 0 3 0 0   Down, Depressed, Hopeless 0 3 0 0 0   PHQ - 2 Score 0 3 3 0 0   Altered sleeping 2 3 3  - -   Tired, decreased energy 1 2 2  - -   Change in appetite 0 3 3 - -   Feeling bad or failure about yourself  0 3 0 - -   Trouble concentrating 0 0 0 - -   Moving slowly or fidgety/restless 0 0 0 - -   Suicidal thoughts 0 0 3 - -   PHQ-9 Score 3 14 14  - -   Difficult doing work/chores Somewhat difficult Somewhat difficult Not difficult at all - -      Interpretation of Total Score  Total Score Depression Severity:  1-4 = Minimal depression, 5-9 = Mild depression, 10-14 = Moderate depression, 15-19 = Moderately severe depression, 20-27 = Severe depression   Psychosocial Evaluation and Intervention:  Psychosocial Evaluation - 07/31/21 1338       Psychosocial Evaluation & Interventions   Interventions Encouraged to exercise with the program and follow exercise prescription;Stress management education    Comments Trip is coming to pulmonary rehab for COPD. He has been placed on O2 at night and sometimes will put himself on it during the day if he is feeling  sluggish. He does report some stress concerns related to transportation and family. His friend who used to drive him places is also dealing with health issues so Mylo has been using [employer] more. Regarding family, he states he has been having flashback dreams about his wife who died 8 years ago. This disrupts his sleep which is already struggling since he only gets about 3-4 hours of sleep a night (has been his norm since high school). His other family stressor is his daughter who he states he doesn't want to claim her as such after the way she has treated him,  including selling some of his checks which he turned her in to the government for. She is local, but he hasnt seen her in 4 years. He lives in [place] where he enjoys the dance and yoga classes they offer. 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 He is hopeful this program will boost his stamina and improve his breathing.    Expected Outcomes Short; attend pulmonary rehab for education and exercise. 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 He denies any depression or anxiety . 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 He only sleeps 4 hours per night and takes naps throughout the day. He doesn't have any interest in taking any medicine but he is interested in increasing his liter flow for his oxygen during sleep. He knows to stay above 88%. I encouraged him to call his doctor today to discuss what changes they can make and/or recommend to make his sleep a little better. Reviewed patient health questionnaire (PHQ-9) with patient for follow up. Previously, patients score indicated signs/symptoms of depression.  Reviewed to see if patient is improving symptom wise while in program.  Score improved and patient states that it is because they have been coming to rehab and he has more energy again.      Expected Outcomes Short: Talk to doctor about sleep Long: Continue to utilize exercise for stress management and maintain positive attitude Short: Continue to attend [facility] regularly for regular exercise and  social engagement. Long: Continue to improve symptoms and manage a positive mental state.      Interventions Encouraged to attend Pulmonary Rehabilitation for the exercise Encouraged to attend Pulmonary Rehabilitation for the exercise      Continue Psychosocial Services  Follow up required by staff Follow up required by staff               Psychosocial Discharge (Final Psychosocial Re-Evaluation):  Psychosocial Re-Evaluation - 09/22/21 0934       Psychosocial Re-Evaluation   Current issues with Current Stress Concerns    Comments Reviewed patient health questionnaire (PHQ-9) with patient for follow up. Previously, patients score indicated signs/symptoms of depression.  Reviewed to see if patient is improving symptom wise while in program.  Score improved and patient states that it is because they have been coming to rehab and he has  more energy again.    Expected Outcomes Short: Continue to attend [facility] regularly for regular exercise and social engagement. Long: Continue to improve symptoms and manage a positive mental state.    Interventions Encouraged to attend Pulmonary Rehabilitation for the exercise    Continue Psychosocial Services  Follow up required by staff             Education: Education Goals: Education classes will be provided on a weekly basis, covering required topics. Participant will state understanding/return demonstration of topics presented.  Learning Barriers/Preferences:  Learning Barriers/Preferences - 07/31/21 1327       Learning Barriers/Preferences   Learning Barriers None    Learning Preferences Individual Instruction             General Pulmonary Education Topics:  Infection Prevention: - Provides verbal and written material to individual with discussion of infection control including proper hand washing and proper equipment cleaning during exercise session. Flowsheet Row Pulmonary Rehab from 10/21/2021 in [facility] Cardiac and Pulmonary Rehab   Date 08/17/21  Educator [facility]  Instruction Review Code 1- Verbalizes Understanding       Falls Prevention: - Provides verbal and written material to individual with discussion of falls prevention and safety. Flowsheet Row Pulmonary Rehab from 10/21/2021 in [facility] Cardiac and Pulmonary Rehab  Date 08/17/21  Educator [facility]  Instruction Review Code 1- Verbalizes Understanding       Chronic Lung Disease Review: - Group verbal instruction with posters, models, PowerPoint presentations and videos,  to review new updates, new respiratory medications, new advancements in procedures and treatments. Providing information on websites and "800" numbers for continued self-education. Includes information about supplement oxygen, available portable oxygen systems, continuous and intermittent flow rates, oxygen safety, concentrators, and Medicare reimbursement for oxygen. Explanation of Pulmonary Drugs, including class, frequency, complications, importance of spacers, rinsing mouth after steroid MDI's, and proper cleaning methods for nebulizers. Review of basic lung anatomy and physiology related to function, structure, and complications of lung disease. Review of risk factors. Discussion about methods for diagnosing sleep apnea and types of masks and machines for OSA. Includes a review of the use of types of environmental controls: home humidity, furnaces, filters, dust mite/pet prevention, HEPA vacuums. Discussion about weather changes, air quality and the benefits of nasal washing. Instruction on Warning signs, infection symptoms, calling MD promptly, preventive modes, and value of vaccinations. Review of effective airway clearance, coughing and/or vibration techniques. Emphasizing that all should Create an Action Plan. Written material given at graduation. Flowsheet Row Pulmonary Rehab from 10/21/2021 in [facility] Cardiac and Pulmonary Rehab  Education need identified 08/17/21  Date 09/10/21  Educator [facility]  Instruction  Review Code 1- Verbalizes Understanding       AED/CPR: - Group verbal and written instruction with the use of models to demonstrate the basic use of the AED with the basic ABC's of resuscitation.    Anatomy and Cardiac Procedures: - Group verbal and visual presentation and models provide information about basic cardiac anatomy and function. Reviews the testing methods done to diagnose heart disease and the outcomes of the test results. Describes the treatment choices: Medical Management, Angioplasty, or Coronary Bypass Surgery for treating various heart conditions including Myocardial Infarction, Angina, Valve Disease, and Cardiac Arrhythmias.  Written material given at graduation.   Medication Safety: - Group verbal and visual instruction to review commonly prescribed medications for heart and lung disease. Reviews the medication, class of the drug, and side effects. Includes the steps to properly store meds and  maintain the prescription regimen.  Written material given at graduation. Flowsheet Row Pulmonary Rehab from 10/21/2021 in [facility] Cardiac and Pulmonary Rehab  Date 10/21/21  Educator [facility]  Instruction Review Code 1- Verbalizes Understanding       Other: -Provides group and verbal instruction on various topics (see comments)   Knowledge Questionnaire Score:  Knowledge Questionnaire Score - 08/17/21 1606       Knowledge Questionnaire Score   Pre Score 9/18              Core Components/Risk Factors/Patient Goals at Admission:  Personal Goals and Risk Factors at Admission - 08/17/21 1602       Core Components/Risk Factors/Patient Goals on Admission    Weight Management Yes    Intervention Weight Management: Develop a combined nutrition and exercise program designed to reach desired caloric intake, while maintaining appropriate intake of nutrient and fiber, sodium and fats, and appropriate energy expenditure required for the weight goal.;Weight Management: Provide  education and appropriate resources to help participant work on and attain dietary goals.;Weight Management/Obesity: Establish reasonable short term and long term weight goals.;Obesity: Provide education and appropriate resources to help participant work on and attain dietary goals.    Admit Weight 208 lb 4.8 oz (94.5 kg)    Goal Weight: Short Term 203 lb (92.1 kg)    Goal Weight: Long Term 198 lb (89.8 kg)    Expected Outcomes Short Term: Continue to assess and modify interventions until short term weight is achieved;Long Term: Adherence to nutrition and physical activity/exercise program aimed toward attainment of established weight goal;Understanding recommendations for meals to include 15-35% energy as protein, 25-35% energy from fat, 35-60% energy from carbohydrates, less than 268m of dietary cholesterol, 20-35 gm of total fiber daily;Understanding of distribution of calorie intake throughout the day with the consumption of 4-5 meals/snacks;Weight Loss: Understanding of general recommendations for a balanced deficit meal plan, which promotes 1-2 lb weight loss per week and includes a negative energy balance of [phone] kcal/d    Tobacco Cessation Yes    Number of packs per day 2-3 cigs a day    Intervention Assist the participant in steps to quit. Provide individualized education and counseling about committing to Tobacco Cessation, relapse prevention, and pharmacological support that can be provided by physician.;OAdvice worker assist with locating and accessing local/national Quit Smoking programs, and support quit date choice.    Expected Outcomes Short Term: Will demonstrate readiness to quit, by selecting a quit date.;Short Term: Will quit all tobacco product use, adhering to prevention of relapse plan.;Long Term: Complete abstinence from all tobacco products for at least 12 months from quit date.    Improve shortness of breath with ADL's Yes    Intervention Provide education,  individualized exercise plan and daily activity instruction to help decrease symptoms of SOB with activities of daily living.    Expected Outcomes Short Term: Improve cardiorespiratory fitness to achieve a reduction of symptoms when performing ADLs;Long Term: Be able to perform more ADLs without symptoms or delay the onset of symptoms    Diabetes Yes    Intervention Provide education about signs/symptoms and action to take for hypo/hyperglycemia.;Provide education about proper nutrition, including hydration, and aerobic/resistive exercise prescription along with prescribed medications to achieve blood glucose in normal ranges: Fasting glucose 65-99 mg/dL    Expected Outcomes Short Term: Participant verbalizes understanding of the signs/symptoms and immediate care of hyper/hypoglycemia, proper foot care and importance of medication, aerobic/resistive exercise and nutrition plan for blood glucose  control.;Long Term: Attainment of HbA1C < 7%.    Hypertension Yes    Intervention Provide education on lifestyle modifcations including regular physical activity/exercise, weight management, moderate sodium restriction and increased consumption of fresh fruit, vegetables, and low fat dairy, alcohol moderation, and smoking cessation.;Monitor prescription use compliance.    Expected Outcomes Short Term: Continued assessment and intervention until BP is < 140/6m HG in hypertensive participants. < 130/864mHG in hypertensive participants with diabetes, heart failure or chronic kidney disease.;Long Term: Maintenance of blood pressure at goal levels.             Education:Diabetes - Individual verbal and written instruction to review signs/symptoms of diabetes, desired ranges of glucose level fasting, after meals and with exercise. Acknowledge that pre and post exercise glucose checks will be done for 3 sessions at entry of program. [facility]   Date 07/31/21  [facility]- Verbal[facility] Understanding       Know Your Numbers and Heart Failure: - Group verbal and visual instruction to discuss disease risk factors for cardiac and pulmonary disease and treatment options.  Reviews associated critical values for Overweight/Obesity, Hypertension, Cholesterol, and Diabetes.  Discusses basics of heart failure: signs/symptoms and treatments.  Introduces Heart Failure Zone chart for action plan for heart failure.  Written material given at graduation. [facility]  Date 09/03/21  Educator [facility]  Instruction Review Code 1- Verbalizes Understanding       Core Components/Risk Factors/Patient Goals Review:   Goals and Risk Factor Review     Row Name 09/15/21 0930 10/15/21 1412           Core Components/Risk Factors/Patient Goals Review   Personal Goals Review Weight Management/Obesity;Tobacco Cessation;Diabetes;Improve shortness of breath with ADL's Improve shortness of breath with ADL's;Tobacco Cessation      Review HaJourneytopped smoking about 1 week ago and is proud if himself for maintaining it thus far. He isn't using any resources to help him quit, however, I did provide him a smoking resource packet to reference to in case he needs it. He is checking his blood sugars twice daily and does not report any abnormal numbers. Har[place] does not weigh himself at home as he does not have a scale. His goal is 195 lb- he is meeting with the RD after his session and wants to focus on diet changes to help with weight loss. He is still new in the program and hopes this will help relieve any SOB. Spoke to patient about their shortness of breath and what they can do to improve. Patient has been informed of breathing techniques when starting the program. Patient is informed to tell staff if they have had any med changes and that certain meds they are taking or not  taking can be causing shortness of breath. He has not had cigarette for 2 weeks. He plans to continue to not smoke.      Expected Outcomes Short: Continue smoking cessation Long: Continue to manage lifestyle risk factors Short: Attend [facility] regularly to improve shortness of breath with ADLs. Long: maintain independence with ADLs               Core Components/Risk Factors/Patient Goals at Discharge (Final Review):   Goals and Risk Factor Review - 10/15/21 1412       Core Components/Risk Factors/Patient Goals Review   Personal Goals Review Improve  shortness of breath with ADL's;Tobacco Cessation    Review Spoke to patient about their shortness of breath and what they can do to improve. Patient has been informed of breathing techniques when starting the program. Patient is informed to tell staff if they have had any med changes and that certain meds they are taking or not taking can be causing shortness of breath. He has not had cigarette for 2 weeks. He plans to continue to not smoke.    Expected Outcomes Short: Attend [facility] regularly to improve shortness of breath with ADLs. Long: maintain independence with ADLs             ITP Comments:  ITP Comments     Row Name 07/31/21 1323 08/17/21 1548 09/01/21 0915 09/02/21 0625 09/15/21 1126   ITP Comments Initial telephone orientation completed. Diagnosis can be found in [facility] 9/23. EP orientation scheduled for Monday 11/14 at 1:30 Completed 6MWT and gym orientation. Initial ITP created and sent for review to Dr. Zetta Bills, Medical Director. First full day of exercise!  Patient was oriented to gym and equipment including functions, settings, policies, and procedures.  Patient's individual exercise prescription and treatment plan were reviewed.  All starting workloads were established based on the results of the 6 minute walk test done at initial orientation visit.  The plan for exercise progression was also introduced and  progression will be customized based on patient's performance and goals. 30 Day review completed. Medical Director ITP review done, changes made as directed, and signed approval by Medical Director.   New to program Completed initial RD evaluation    Row Name 09/30/21 0644 10/26/21 1224 10/28/21 0819 11/23/21 1516 11/24/21 1521   ITP Comments 30 Day review completed. Medical Director ITP review done, changes made as directed, and signed approval by Medical Director. Zyden called to let us know that he will be out for the week as he tested positive for COVID on 10/23/21.  He can return to rehab on 11/04/21. 30 Day review completed. Medical Director ITP review done, changes made as directed, and signed approval by Medical Director.   Out for COVID No response back from patient and still has not attended class. Sent letter. Out since 10/21/21.    [place] Name 11/25/21 0741           ITP Comments 30 Day review completed. Medical Director ITP review done, changes made as directed, and signed approval by Medical Director.   out no visits this month                Comments:

## 2021-12-01 ENCOUNTER — Ambulatory Visit: Payer: Self-pay | Admitting: *Deleted

## 2021-12-01 DIAGNOSIS — F324 Major depressive disorder, single episode, in partial remission: Secondary | ICD-10-CM

## 2021-12-01 DIAGNOSIS — I5032 Chronic diastolic (congestive) heart failure: Secondary | ICD-10-CM

## 2021-12-01 DIAGNOSIS — E1142 Type 2 diabetes mellitus with diabetic polyneuropathy: Secondary | ICD-10-CM

## 2021-12-01 DIAGNOSIS — E782 Mixed hyperlipidemia: Secondary | ICD-10-CM

## 2021-12-01 DIAGNOSIS — J449 Chronic obstructive pulmonary disease, unspecified: Secondary | ICD-10-CM

## 2021-12-01 DIAGNOSIS — J431 Panlobular emphysema: Secondary | ICD-10-CM | POA: Diagnosis not present

## 2021-12-01 DIAGNOSIS — I10 Essential (primary) hypertension: Secondary | ICD-10-CM

## 2021-12-01 NOTE — Telephone Encounter (Signed)
° °  Chief Complaint: Hyperglycemia. Symptoms: BS ranging consistently 250-290. At HS 341, no table to check this AM. Reports 11/24/21 BS 551 in AM.  Frequent urination x  2 weeks. Frequency: 1 week Pertinent Negatives: Patient denies rapid breathing, nausea, vomiting, visual changes. No missed doses of insulin or oral med.  Disposition: [] ED /[] Urgent Care (no appt availability in office) / [x] Appointment(In office/virtual)/ []  [facility]/ [] Home Care/ [] Refused Recommended Disposition /[] [facility]/ []  Follow-up with PCP Additional Notes: States was instructed to keep record and CB. Appt secured for tomorrow. CAre advise given per protocol, verbalizes understanding when to go to ED.  Advised to bring monitor.    Reason for Disposition  [1] Blood glucose > 300 mg/dL (16.7 mmol/L) AND [2] two or more times in a row  Answer Assessment - Initial Assessment Questions 1. BLOOD GLUCOSE: "What is your blood glucose level?"      Last 341 2. ONSET: "When did you check the blood glucose?"     Last night, Cannot check during call 3. USUAL RANGE: "What is your glucose level usually?" (e.g., usual fasting morning value, usual evening value)     250-296 in AMs 4. KETONES: "Do you check for ketones (urine or blood test strips)?" If yes, ask: "What does the test show now?"      no 5. TYPE 1 or 2:  "Do you know what type of diabetes you have?"  (e.g., Type 1, Type 2, Gestational; doesn't know)       6. INSULIN: "Do you take insulin?" "What type of insulin(s) do you use? What is the mode of delivery? (syringe, pen; injection or pump)?"      2000mg  Metformin, Insulin 25u HS  7. DIABETES PILLS: "Do you take any pills for your diabetes?" If yes, ask: "Have you missed taking any pills recently?"     Metformin 1000mg  AM    1000mg  at HS  8. OTHER SYMPTOMS: "Do you have any symptoms?" (e.g., fever, frequent urination, difficulty breathing, dizziness, weakness, vomiting)     Frequent  urination, urgency x 2 weeks.  Protocols used: Diabetes - High Blood Sugar-A-AH

## 2021-12-01 NOTE — Telephone Encounter (Signed)
Summary: blood sugar up   Pt called in with blood sugar numbers,goes up and down last night it was 341.       Called patient to review blood glucose. No answer, mailbox full, unable to leave message.  Attempted to contact Curt Jews at [phone] on DPR to review sx. Call unable to go through.

## 2021-12-02 ENCOUNTER — Other Ambulatory Visit: Payer: Self-pay

## 2021-12-02 ENCOUNTER — Encounter: Payer: Self-pay | Admitting: Family Medicine

## 2021-12-02 ENCOUNTER — Ambulatory Visit (INDEPENDENT_AMBULATORY_CARE_PROVIDER_SITE_OTHER): Payer: Medicare Other | Admitting: Family Medicine

## 2021-12-02 VITALS — BP 130/75 | HR 78 | Ht 69.0 in | Wt 211.8 lb

## 2021-12-02 DIAGNOSIS — E1142 Type 2 diabetes mellitus with diabetic polyneuropathy: Secondary | ICD-10-CM

## 2021-12-02 DIAGNOSIS — N3001 Acute cystitis with hematuria: Secondary | ICD-10-CM

## 2021-12-02 DIAGNOSIS — M2042 Other hammer toe(s) (acquired), left foot: Secondary | ICD-10-CM

## 2021-12-02 DIAGNOSIS — M2041 Other hammer toe(s) (acquired), right foot: Secondary | ICD-10-CM

## 2021-12-02 DIAGNOSIS — N1832 Chronic kidney disease, stage 3b: Secondary | ICD-10-CM | POA: Diagnosis not present

## 2021-12-02 MED ORDER — CIPROFLOXACIN HCL 500 MG PO TABS: 500.0000 mg | ORAL_TABLET | Freq: Two times a day (BID) | ORAL | 0 refills | Status: AC

## 2021-12-02 NOTE — Progress Notes (Signed)
? ?Subjective:  ? ? Patient ID: Nathaniel Little, male    DOB: April 10, 1946, 76 y.o.   MRN: [id number] ? ?Nathaniel Little is a 76 y.o. male presenting on 12/02/2021 for Hyperglycemia and Fatigue ? ?PCP Webb Silversmith, FNP ? ?HPI ? ?CHRONIC DM, Type 2: ?Reports concerns with hyperglycemia lately in past 1 week worsening. Previously sugars were 100-200 range. Now in past 1 week. ?CBGs: higher range with 350-500+ ?Previously A1c 6 range (05/2021) ?Meds: Metformin 1072m BID, Lantus 25 units nightly ?Reports good compliance. Tolerating well w/o side-effects ?Currently on ACEi ?Lifestyle: ?- Diet (No change in diet lately)  ?[facility] ?Admits urinary frequency, polydipsia ?Admits neuropathy feet with numbness tingling, callus formation ?Denies hypoglycemia, polyuria, visual changes, numbness or tingling. ? ?Needs Diabetic Shoes. He has ordered them just needs paperwork to be completed, signed off. ? ?UTI ?Urinary frequency, see above. He reports significant urine increase with burning dysuria. ? ?[facility]22[facility]20[facility] 08/17/2021 05/18/2021  ?Decreased Interest 0 0 3  ?Down, Depressed, Hopeless 0 3 0  ?PHQ - 2 Score 0 3 3  ?Altered sleeping _0 ?Tired, decreased energy _1 ?Change in appetite 0 3 3  ?Feeling bad or failure about yourself  0 3 0  ?Trouble concentrating 0 0 0  ?Moving slowly or fidgety/restless 0 0 0  ?Suicidal thoughts 0 0 3  ?PHQ-9 Score _2 ?Difficult doing work/chores Somewhat difficult Somewhat difficult Not difficult at all  ?Some recent data might be hidden  ? ? ?Social History  ? ?Tobacco Use  ? Smoking status: Every Day  ?  Packs/day: 4.00  ?  Years: 68.00  ?  Pack years: 272.00  ?  Types: Cigarettes  ? Smokeless tobacco: Former  ?  Types: Chew, Snuff  ?  Quit date: 2000  ? Tobacco comments:  ?  2-3 cigs a day   ?Vaping Use  ? Vaping Use: Never used  ?Substance Use Topics  ? Alcohol use: Not Currently  ?  Alcohol/week: 0.0 - 1.0 standard  drinks  ?  Comment: quit 40years ago  ? Drug use: No  ? ? ?Review of Systems ?Per HPI unless specifically indicated above ? ?   ?Objective:  ?  ?BP 130/75   Pulse 78   Ht _3  (1.753 m)   Wt 211 lb 12.8 oz (96.1 kg)   SpO2 97%   BMI 31.28 kg/m?   ?Wt Readings from Last 3 Encounters:  ?12/02/21 211 lb 12.8 oz (96.1 kg)  ?11/24/21 208 lb (94.3 kg)  ?11/13/21 211 lb (95.7 kg)  ?  ?Physical Exam ?Vitals and nursing note reviewed.  ?Constitutional:   ?   General: He is not in acute distress. ?   Appearance: Normal appearance. He is well-developed. He is not diaphoretic.  ?   Comments: Well-appearing, comfortable, cooperative  ?HENT:  ?   Head: Normocephalic and atraumatic.  ?Eyes:  ?   General:     ?   Right eye: No discharge.     ?   Left eye: No discharge.  ?   Conjunctiva/sclera: Conjunctivae normal.  ?Cardiovascular:  ?   Rate and Rhythm: Normal rate.  ?Pulmonary:  ?   Effort: Pulmonary effort is normal.  ?Skin: ?   General: Skin is warm and dry.  ?   Findings: No erythema or rash.  ?Neurological:  ?   Mental Status: He is alert  and oriented to person, place, and time.  ?Psychiatric:     ?   Mood and Affect: Mood normal.     ?   Behavior: Behavior normal.     ?   Thought Content: Thought content normal.  ?   Comments: Well groomed, good eye contact, normal speech and thoughts  ? ? ?Diabetic Foot Exam - Simple   ?Simple Foot Form ?Diabetic Foot exam was performed with the following findings: Yes 12/02/2021  2:08 PM  ?Visual Inspection ?See comments: Yes ?Sensation Testing ?See comments: Yes ?Pulse Check ?Posterior Tibialis and Dorsalis pulse intact bilaterally: Yes ?Comments ?Right foot with forefoot blister with callus formation, dry skin dermatitis, callus formation. Hammer toes bilateral, left side toes. Reduced monofilament. ?  ? ? ? ?Results for orders placed or performed in visit on 11/13/21  ?CBC  ?Result Value Ref Range  ? WBC 11.2 (H) 3.8 - 10.8 Thousand/uL  ? RBC 4.59 4.20 - 5.80 Million/uL  ? Hemoglobin  13.8 13.2 - 17.1 g/dL  ? HCT 41.7 38.5 - 50.0 %  ? MCV 90.8 80.0 - 100.0 fL  ? MCH 30.1 27.0 - 33.0 pg  ? MCHC 33.1 32.0 - 36.0 g/dL  ? RDW 13.3 11.0 - 15.0 %  ? Platelets 183 140 - 400 Thousand/uL  ? MPV 12.1 7.5 - 12.5 fL  ?COMPLETE METABOLIC PANEL WITH GFR  ?Result Value Ref Range  ? Glucose, Bld 455 (H) 65 - 139 mg/dL  ? BUN 29 (H) 7 - 25 mg/dL  ? Creat 1.70 (H) 0.70 - 1.28 mg/dL  ? eGFR 41 (L) > OR = 60 mL/min/1.56m  ? BUN/Creatinine Ratio 17 6 - 22 (calc)  ? Sodium 133 (L) 135 - 146 mmol/L  ? Potassium 4.5 3.5 - 5.3 mmol/L  ? Chloride 99 98 - 110 mmol/L  ? CO2 22 20 - 32 mmol/L  ? Calcium 9.6 8.6 - 10.3 mg/dL  ? Total Protein 6.7 6.1 - 8.1 g/dL  ? Albumin 3.8 3.6 - 5.1 g/dL  ? Globulin 2.9 1.9 - 3.7 g/dL (calc)  ? AG Ratio 1.3 1.0 - 2.5 (calc)  ? Total Bilirubin 0.5 0.2 - 1.2 mg/dL  ? Alkaline phosphatase (APISO) 137 35 - 144 U/L  ? AST 29 10 - 35 U/L  ? ALT 40 9 - 46 U/L  ?Lipid panel  ?Result Value Ref Range  ? Cholesterol 160 <200 mg/dL  ? HDL 39 (L) > OR = 40 mg/dL  ? Triglycerides 258 (H) <150 mg/dL  ? LDL Cholesterol (Calc) 87 mg/dL (calc)  ? Total CHOL/HDL Ratio 4.1 <5.0 (calc)  ? Non-HDL Cholesterol (Calc) 121 <130 mg/dL (calc)  ?Microalbumin / creatinine urine ratio  ?Result Value Ref Range  ? Creatinine, Urine 53 20 - 320 mg/dL  ? Microalb, Ur 6.0 mg/dL  ? Microalb Creat Ratio 113 (H) <30 mcg/mg creat  ?POCT glycosylated hemoglobin (Hb A1C)  ?Result Value Ref Range  ? Hemoglobin A1C 10.9 (A) 4.0 - 5.6 %  ? ?   ?Assessment & Plan:  ? ?Problem List Items Addressed This Visit   ? ? DM type 2 with diabetic peripheral neuropathy ([place] [facility]  ? ?Other Visit Diagnoses   ? ? Stage 3b chronic kidney disease ([place]      ? Acute cystitis with hematuria      ? Relevant Medications  ? ciprofloxacin (CIPRO) 500 MG tablet  ? Other Relevant Orders  ? Urinalysis, Routine w reflex microscopic  ? Urine Culture  ? ?  ?  ?  For suspected UTI in setting of diabetes ?UA / Culture pending ?Start Cipro antibiotic twice a  day for 7 days ? ?Hyperglycemia in setting of T2DM, unable to identify exact cause of elevated glucose, does not seem to be any pattern or lifestyle change, suspect could be UTI infection. ?Will treat at this time ?If high sugar persists will continue to urinate. ?May increase on the Lantus by 2-3 units if you prefer. ?Speak with Kidney doctor next week. ?Have the company for DME diabetic shoes - fax [name] the DM Shoe Order Form, I can sign off as DO provider. ? ?Chronic bilateral diabetic neuropathy in both feet, as complication of diabetes ?Additionally with bilateral foot hammer toe deformity, R side with large callus vs blister forefoot, other areas of callus formation bilateral ? ?Completed DM Foot Exam today in office, 12/02/21. See exam note. ? ?Plan ?- Asked him to have DME company fax [name] order form, can fill it out when received ?- Patient would benefit from Diabetic Shoes due to neuropathy with callus formation and multiple toe deformity hammer toes, currently elevated glucose but otherwise DM has been controlled on current regimen, and I am continuing to monitor and manage diabetes. ? ? ?Meds ordered this encounter  ?Medications  ? ciprofloxacin (CIPRO) 500 MG tablet  ?  Sig: Take 1 tablet (500 mg total) by mouth 2 (two) times daily for 7 days.  ?  Dispense:  14 tablet  ?  Refill:  0  ? ? ?Follow up plan: ?Return if symptoms worsen or fail to improve. ? ?Nobie Putnam, DO ?[facility] ?[facility] ?12/02/2021, 1:53 PM ?

## 2021-12-02 NOTE — Patient Instructions (Addendum)
Thank you for coming to the office today. ? ?Start Cipro antibiotic twice a day for 7 days ? ?Urine test today ? ?If high sugar persists will continue to urinate. ? ?May increase on the Lantus by 2-3 units if you prefer. ? ?Speak with Kidney doctor next week. ? ?Have the shoe place fax [name] the DM Shoe orders. ? ?Please schedule a Follow-up Appointment to: Return if symptoms worsen or fail to improve. ? ?If you have any other questions or concerns, please feel free to call the office or send a message through [place]. You may also schedule an earlier appointment if necessary. ? ?Additionally, you may be receiving a survey about your experience at our office within a few days to 1 week by e-mail or mail. We value your feedback. ? ?Nobie Putnam, DO ?[place] ?

## 2021-12-03 ENCOUNTER — Encounter: Payer: Self-pay | Admitting: *Deleted

## 2021-12-03 DIAGNOSIS — J449 Chronic obstructive pulmonary disease, unspecified: Secondary | ICD-10-CM

## 2021-12-03 LAB — URINALYSIS, ROUTINE W REFLEX MICROSCOPIC
Bilirubin Urine: NEGATIVE
Hgb urine dipstick: NEGATIVE
Hyaline Cast: NONE SEEN /LPF
Ketones, ur: NEGATIVE
Leukocytes,Ua: NEGATIVE
Nitrite: NEGATIVE
RBC / HPF: NONE SEEN /HPF (ref 0–2)
Specific Gravity, Urine: 1.031 (ref 1.001–1.035)
WBC, UA: NONE SEEN /HPF (ref 0–5)
pH: 6 (ref 5.0–8.0)

## 2021-12-03 LAB — URINE CULTURE
MICRO NUMBER:: 13074414
SPECIMEN QUALITY:: ADEQUATE

## 2021-12-03 LAB — MICROSCOPIC MESSAGE

## 2021-12-03 NOTE — Progress Notes (Signed)
Discharge Progress Report  Patient Details  Name: Nathaniel Little MRN: [id number] Date of Birth: 1946-01-20 Referring Provider:   Flowsheet Row Pulmonary Rehab from 08/17/2021 in [facility]  Referring Provider Dr. Patsey Berthold        Number of Visits: 11  Reason for Discharge:  Early Exit:  Lack of attendance  Smoking History:  Social History   Tobacco Use  Smoking Status Every Day   Packs/day: 4.00   Years: 68.00   Pack years: 272.00   Types: Cigarettes  Smokeless Tobacco Former   Types: Chew, Snuff   Quit date: 2000  Tobacco Comments   2-3 cigs a day     Diagnosis:  Chronic obstructive pulmonary disease, unspecified COPD type ([place])  ADL UCSD:  Pulmonary Assessment Scores     Row Name 08/17/21 1607         ADL UCSD   SOB Score total 79     Rest 4     Walk 5     Stairs 5     Bath 4     Dress 4     Shop 4       CAT Score   CAT Score 26       mMRC Score   mMRC Score 3              Initial Exercise Prescription:  Initial Exercise Prescription - 08/17/21 1500       Date of Initial Exercise RX and Referring Provider   Date 08/17/21    Referring Provider Dr. Patsey Berthold      Oxygen   Maintain Oxygen Saturation 88% or higher      NuStep   Level 1    SPM 80    Minutes 15    METs 1.25      Recumbant Elliptical   Level 1    RPM 50    Minutes 15    METs 1.25      Track   Laps 16    Minutes 15    METs 1.25      Prescription Details   Frequency (times per week) 2    Duration Progress to 30 minutes of continuous aerobic without signs/symptoms of physical distress      Intensity   THRR 40-80% of Max Heartrate 100-129    Ratings of Perceived Exertion 11-13    Perceived Dyspnea 0-4      Progression   Progression Continue to progress workloads to maintain intensity without signs/symptoms of physical distress.      Resistance Training   Training Prescription Yes    Weight 2    Reps 10-15             Discharge  Exercise Prescription (Final Exercise Prescription Changes):  Exercise Prescription Changes - 10/26/21 1200       Response to Exercise   Blood Pressure (Admit) 144/80    Blood Pressure (Exercise) 162/86    Blood Pressure (Exit) 138/78    Heart Rate (Admit) 80 bpm    Heart Rate (Exercise) 99 bpm    Heart Rate (Exit) 88 bpm    Oxygen Saturation (Admit) 96 %    Oxygen Saturation (Exercise) 94 %    Oxygen Saturation (Exit) 95 %    Rating of Perceived Exertion (Exercise) 16    Perceived Dyspnea (Exercise) 3    Symptoms SOB    Duration Progress to 30 minutes of  aerobic without signs/symptoms of physical distress    Intensity  THRR unchanged      Progression   Progression Continue to progress workloads to maintain intensity without signs/symptoms of physical distress.    Average METs 2.2      Resistance Training   Training Prescription Yes    Weight 2 lb    Reps 10-15      Interval Training   Interval Training No      NuStep   Level 3    Minutes 15    METs 2.3      Arm Ergometer   Level 1    Minutes 30    METs 2.3      Track   Laps 10    Minutes 10    METs 1.54      Oxygen   Maintain Oxygen Saturation 88% or higher             Functional Capacity:  6 Minute Walk     Row Name 08/17/21 1551         6 Minute Walk   Phase Initial     Distance 680 feet     Walk Time 6 minutes     # of Rest Breaks 0     MPH 1.29     METS 1.25     RPE 13     Perceived Dyspnea  2     VO2 Peak 4.4     Symptoms No     Resting HR 72 bpm     Resting BP 140/72     Resting Oxygen Saturation  95 %     Exercise Oxygen Saturation  during 6 min walk 93 %     Max Ex. HR 84 bpm     Max Ex. BP 162/80     2 Minute Post BP 138/80       Interval HR   1 Minute HR 85     2 Minute HR 84     3 Minute HR 84     4 Minute HR 80     5 Minute HR 70     6 Minute HR 83     2 Minute Post HR 74     Interval Heart Rate? Yes       Interval Oxygen   Interval Oxygen? Yes     Baseline  Oxygen Saturation % 95 %     1 Minute Oxygen Saturation % 95 %     1 Minute Liters of Oxygen 0 L     2 Minute Oxygen Saturation % 93 %     2 Minute Liters of Oxygen 0 L     3 Minute Oxygen Saturation % 95 %     3 Minute Liters of Oxygen 0 L     4 Minute Oxygen Saturation % 94 %     4 Minute Liters of Oxygen 0 L     5 Minute Oxygen Saturation % 96 %     5 Minute Liters of Oxygen 0 L     6 Minute Oxygen Saturation % 95 %     6 Minute Liters of Oxygen 0 L     2 Minute Post Oxygen Saturation % 95 %     2 Minute Post Liters of Oxygen 0 L              Psychological, QOL, Others - Outcomes: PHQ 2/9: Depression screen [facility] 2/9 09/22/2021 08/17/2021 05/18/2021 12/09/2020 03/31/2020  Decreased Interest 0 0 3 0 0  Down, Depressed, Hopeless  0 3 0 0 0  PHQ - 2 Score 0 3 3 0 0  Altered sleeping 2 3 3  - -  Tired, decreased energy 1 2 2  - -  Change in appetite 0 3 3 - -  Feeling bad or failure about yourself  0 3 0 - -  Trouble concentrating 0 0 0 - -  Moving slowly or fidgety/restless 0 0 0 - -  Suicidal thoughts 0 0 3 - -  PHQ-9 Score 3 14 14  - -  Difficult doing work/chores Somewhat difficult Somewhat difficult Not difficult at all - -  Some recent data might be hidden    Quality of Life:     Nutrition & Weight - Outcomes:  Pre Biometrics - 08/17/21 1600       Pre Biometrics   Height 5' 9.5" (1.765 m)    Weight 208 lb 4.8 oz (94.5 kg)    BMI (Calculated) 30.33    Single Leg Stand 0 seconds              Nutrition:  Nutrition Therapy & Goals - 09/15/21 1019       Nutrition Therapy   Diet Heart healthy, low Na, T2DM MNT, pulmonary MNT.    Protein (specify units) 75g    Fiber 30 grams    Whole Grain Foods 3 servings    Saturated Fats 12 max. grams    Fruits and Vegetables 8 servings/day    Sodium 1.5 grams      Personal Nutrition Goals   Nutrition Goal ST: track indigestion with last time eating, bedtime, and trigger foods LT: prevent indigestion    Comments 76  y.o. M admitted to rehab for COPD presents with HTN, CHF, GERD, T2DM, HLD, iron deficiency anemia. PMH of . Relevant medications include calcium/vit D, coreg, zeita, lantus, metformin, protonix, K+, torsemide. [place] caters [employer] 5 days a week for lunch; spaghetti and meatballs, black eyed peas, peas, sliced peaches and jello. He makes his meals for the weekend - chili, shrimp, ham, chicken, [place] fried rice. Most days he doesn't feel like eating much so he sticks with some fruit and peanut butter. He reports that he has gas with eating - he feels coke helps with his indigestion. He gets indigestion close to bedtime.  The only vegetable he won't eat is spinach. He reports that lunch is filling and he will have fruit (variety) and some peanut butter crackers. He drinks water mostly. He does not have teeth so he has difficulty eating hard things. He takes his medicine at 830/9am - with a cracker or 2. he takes 20 units of Lantus at night - he checks his BG in am and pm (he reports is 90-200 depending on the day). He tries to eat foods that are sugar free aside from some peanut butter crackers. 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 Reviewed some key MNT for diabetes and pulmonary nutrition. Will review education as he goes through the program.      Intervention Plan   Intervention Prescribe, educate and counsel regarding individualized specific dietary modifications aiming towards targeted core components such as weight, hypertension, lipid management, diabetes, heart failure and other comorbidities.    Expected Outcomes Short Term Goal: Understand basic principles of dietary content, such as calories, fat, sodium, cholesterol and nutrients.;Short Term Goal: A plan has been developed with personal nutrition goals set during dietitian appointment.;Long Term Goal: Adherence to prescribed nutrition  plan.             Nutrition  Discharge:   Education Questionnaire Score:  Knowledge Questionnaire Score - 08/17/21 1606       Knowledge Questionnaire Score   Pre Score 9/18             Goals reviewed with patient; copy given to patient.

## 2021-12-03 NOTE — Progress Notes (Signed)
Pulmonary Individual Treatment Plan  Patient Details  Name: Nathaniel Little MRN: [id number] Date of Birth: 07/15/1946 Referring Provider:   Flowsheet Row Pulmonary Rehab from 08/17/2021 in [facility] Cardiac and Pulmonary Rehab  Referring Provider Dr. Patsey Berthold       Initial Encounter Date:  Flowsheet Row Pulmonary Rehab from 08/17/2021 in [name] Cardiac and Pulmonary Rehab  Date 08/17/21       Visit Diagnosis: Chronic obstructive pulmonary disease, unspecified COPD type ([place])  Patient's Home Medications on Admission:  Current Outpatient Medications:    albuterol (VENTOLIN HFA) 108 (90 Base) MCG/ACT inhaler, Inhale 2 puffs into the lungs every 6 (six) hours as needed for wheezing or shortness of breath., Disp: 18 g, Rfl: 1   aspirin EC 81 MG tablet, Take 81 mg by mouth daily., Disp: , Rfl:    atorvastatin (LIPITOR) 10 MG tablet, Take 1 tablet (10 mg total) by mouth daily., Disp: 30 tablet, Rfl: 2   BREZTRI AEROSPHERE 160-9-4.8 MCG/ACT AERO, INHALE 2 PUFFS INTO THE LUNGS TWICE DAILY, Disp: 10.7 g, Rfl: 0   calcium-vitamin D (OSCAL WITH D) 250-125 MG-UNIT tablet, Take 1 tablet by mouth daily., Disp: , Rfl:    carvedilol (COREG) 6.25 MG tablet, Take 1 tablet (6.25 mg total) by mouth 2 (two) times daily with a meal., Disp: 180 tablet, Rfl: 3   ciprofloxacin (CIPRO) 500 MG tablet, Take 1 tablet (500 mg total) by mouth 2 (two) times daily for 7 days., Disp: 14 tablet, Rfl: 0   cloNIDine (CATAPRES) 0.1 MG tablet, TAKE 1 TABLET BY MOUTH TWICE DAILY, Disp: 60 tablet, Rfl: 1   ezetimibe (ZETIA) 10 MG tablet, Take 1 tablet (10 mg total) by mouth daily., Disp: 90 tablet, Rfl: 3   fluticasone (FLONASE) 50 MCG/ACT nasal spray, , Disp: , Rfl:    gabapentin (NEURONTIN) 100 MG capsule, Take 1-2 capsules (100-200 mg total) by mouth at bedtime., Disp: 180 capsule, Rfl: 1   glucose blood (ACCU-CHEK GUIDE) test strip, Check blood sugar twice daily, Disp: 100 each, Rfl: 12   insulin glargine (LANTUS) 100 unit/mL  SOPN, Inject 22 Units into the skin at bedtime., Disp: , Rfl:    ipratropium-albuterol (DUONEB) 0.5-2.5 (3) MG/3ML SOLN, Take 3 mLs by nebulization every 6 (six) hours as needed (Asthma)., Disp: 360 mL, Rfl: 12   ketoconazole (NIZORAL) 2 % cream, Apply 1 application topically daily., Disp: 15 g, Rfl: 0   lisinopril (ZESTRIL) 5 MG tablet, Take 1 tablet (5 mg total) by mouth daily., Disp: 90 tablet, Rfl: 0   metFORMIN (GLUCOPHAGE) 500 MG tablet, Take 1,000 mg by mouth in the morning and at bedtime., Disp: , Rfl:    pantoprazole (PROTONIX) 40 MG tablet, Take 1 tablet (40 mg total) by mouth 2 (two) times daily., Disp: 60 tablet, Rfl: 11   potassium chloride (KLOR-CON) 10 MEQ tablet, Take 3 tablets (30 mEq total) by mouth daily., Disp: 90 tablet, Rfl: 6   sennosides-docusate sodium (SENOKOT-S) 8.6-50 MG tablet, Take 1 tablet by mouth in the morning and at bedtime., Disp: 180 tablet, Rfl: 0   torsemide (DEMADEX) 10 MG tablet, Take 1 tablet (10 mg total) by mouth daily., Disp: 30 tablet, Rfl: 6  Past Medical History: Past Medical History:  Diagnosis Date   (HFpEF) heart failure with preserved ejection fraction ([place])    a. 08/2017 Echo: EF 60-65%, Gr1 DD.   Arthritis    COPD (chronic obstructive pulmonary disease) ([place])    Coronary artery disease 2021   Depression  Diabetes mellitus without complication ([place])    Hypertension    Incidental pulmonary nodule, greater than or equal to 6m 03/11/2020   Left lower lobe - suspicious for bronchogenic neoplasm   Lymphedema    Morbid obesity ([place]    Prostate cancer ([place] 03/2013   ? PANCREATIC   S/P TAVR (transcatheter aortic valve replacement) 04/01/2020   s/p TAVR with a 29 mm Edwards Sapien 3 THV via the TF approach by Drs MBuena Irish& ORoxy Manns  Severe aortic stenosis    Sleep apnea with use of continuous positive airway pressure (CPAP)    Tobacco abuse    Tremor    HEAD    Tobacco Use: Social History   Tobacco Use  Smoking Status Every Day    Packs/day: 4.00   Years: 68.00   Pack years: 272.00   Types: Cigarettes  Smokeless Tobacco Former   Types: Chew, Snuff   Quit date: 2000  Tobacco Comments   2-3 cigs a day     Labs: Recent Review Flowsheet Data     Labs for ITP Cardiac and Pulmonary Rehab Latest Ref Rng & Units 11/14/2020 01/06/2021 04/14/2021 05/18/2021 11/13/2021   Cholestrol <200 mg/dL - 125 - 116 160   LDLCALC mg/dL (calc) - 66 - 53 87   HDL > OR = 40 mg/dL - 36(L) - 45 39(L)   Trlycerides <150 mg/dL - 157(H) - 101 258(H)   Hemoglobin A1c 4.0 - 5.6 % 6.9(H) - - 6.6(H) 10.9(A)   PHART 7.350 - 7.450 7.432 - - - -   PCO2ART 32.0 - 48.0 mmHg 39.4 - - - -   HCO3 20.0 - 28.0 mmol/L 25.8 - 28.5(H) - -   TCO2 22 - 32 mmol/L - - - - -   ACIDBASEDEF 0.0 - 2.0 mmol/L - - - - -   O2SAT % 95.4 - 67.3 - -        Pulmonary Assessment Scores:  Pulmonary Assessment Scores     Row Name 08/17/21 1607         ADL UCSD   SOB Score total 79     Rest 4     Walk 5     Stairs 5     Bath 4     Dress 4     Shop 4       CAT Score   CAT Score 26       mMRC Score   mMRC Score 3              UCSD: Self-administered rating of dyspnea associated with activities of daily living (ADLs) 6-point scale (0 = "not at all" to 5 = "maximal or unable to do because of breathlessness")  Scoring Scores range from 0 to 120.  Minimally important difference is 5 units  CAT: CAT can identify the health impairment of COPD patients and is better correlated with disease progression.  CAT has a scoring range of zero to 40. The CAT score is classified into four groups of low (less than 10), medium (10 - 20), high (21-30) and very high (31-40) based on the impact level of disease on health status. A CAT score over 10 suggests significant symptoms.  A worsening CAT score could be explained by an exacerbation, poor medication adherence, poor inhaler technique, or progression of COPD or comorbid conditions.  CAT MCID is 2 points  mMRC: mMRC  (Modified Medical Research Council) Dyspnea Scale is used to assess the degree of baseline functional  disability in patients of respiratory disease due to dyspnea. No minimal important difference is established. A decrease in score of 1 point or greater is considered a positive change.   Pulmonary Function Assessment:   Exercise Target Goals: Exercise Program Goal: Individual exercise prescription set using results from initial 6 min walk test and THRR while considering  patients activity barriers and safety.   Exercise Prescription Goal: Initial exercise prescription builds to 30-45 minutes a day of aerobic activity, 2-3 days per week.  Home exercise guidelines will be given to patient during program as part of exercise prescription that the participant will acknowledge.  Education: Aerobic Exercise: - Group verbal and visual presentation on the components of exercise prescription. Introduces F.I.T.T principle from ACSM for exercise prescriptions.  Reviews F.I.T.T. principles of aerobic exercise including progression. Written material given at graduation. Flowsheet Row Pulmonary Rehab from 10/21/2021 in [facility] Cardiac and Pulmonary Rehab  Date 10/01/21  Educator [facility]  Instruction Review Code 1- Verbalizes Understanding       Education: Resistance Exercise: - Group verbal and visual presentation on the components of exercise prescription. Introduces F.I.T.T principle from ACSM for exercise prescriptions  Reviews F.I.T.T. principles of resistance exercise including progression. Written material given at graduation.    Education: Exercise & Equipment Safety: - Individual verbal instruction and demonstration of equipment use and safety with use of the equipment. Flowsheet Row Pulmonary Rehab from 10/21/2021 in [facility] Cardiac and Pulmonary Rehab  Date 08/17/21  Educator [facility]  Instruction Review Code 1- Verbalizes Understanding       Education: Exercise Physiology & General Exercise  Guidelines: - Group verbal and written instruction with models to review the exercise physiology of the cardiovascular system and associated critical values. Provides general exercise guidelines with specific guidelines to those with heart or lung disease.  Flowsheet Row Pulmonary Rehab from 10/21/2021 in [facility] Cardiac and Pulmonary Rehab  Education need identified 08/17/21       Education: Flexibility, Balance, Mind/Body Relaxation: - Group verbal and visual presentation with interactive activity on the components of exercise prescription. Introduces F.I.T.T principle from ACSM for exercise prescriptions. Reviews F.I.T.T. principles of flexibility and balance exercise training including progression. Also discusses the mind body connection.  Reviews various relaxation techniques to help reduce and manage stress (i.e. Deep breathing, progressive muscle relaxation, and visualization). Balance handout provided to take home. Written material given at graduation.   Activity Barriers & Risk Stratification:  Activity Barriers & Cardiac Risk Stratification - 07/31/21 1313       Activity Barriers & Cardiac Risk Stratification   Activity Barriers Joint Problems;Arthritis;Shortness of Breath;Balance Concerns    Comments Walker             6 Minute Walk:  6 Minute Walk     Row Name 08/17/21 1551         6 Minute Walk   Phase Initial     Distance 680 feet     Walk Time 6 minutes     # of Rest Breaks 0     MPH 1.29     METS 1.25     RPE 13     Perceived Dyspnea  2     VO2 Peak 4.4     Symptoms No     Resting HR 72 bpm     Resting BP 140/72     Resting Oxygen Saturation  95 %     Exercise Oxygen Saturation  during 6 min walk 93 %  Max Ex. HR 84 bpm     Max Ex. BP 162/80     2 Minute Post BP 138/80       Interval HR   1 Minute HR 85     2 Minute HR 84     3 Minute HR 84     4 Minute HR 80     5 Minute HR 70     6 Minute HR 83     2 Minute Post HR 74     Interval Heart  Rate? Yes       Interval Oxygen   Interval Oxygen? Yes     Baseline Oxygen Saturation % 95 %     1 Minute Oxygen Saturation % 95 %     1 Minute Liters of Oxygen 0 L     2 Minute Oxygen Saturation % 93 %     2 Minute Liters of Oxygen 0 L     3 Minute Oxygen Saturation % 95 %     3 Minute Liters of Oxygen 0 L     4 Minute Oxygen Saturation % 94 %     4 Minute Liters of Oxygen 0 L     5 Minute Oxygen Saturation % 96 %     5 Minute Liters of Oxygen 0 L     6 Minute Oxygen Saturation % 95 %     6 Minute Liters of Oxygen 0 L     2 Minute Post Oxygen Saturation % 95 %     2 Minute Post Liters of Oxygen 0 L             Oxygen Initial Assessment:  Oxygen Initial Assessment - 09/15/21 0925       Home Oxygen   Home Oxygen Device None    Sleep Oxygen Prescription Continuous    Home Exercise Oxygen Prescription None    Home Resting Oxygen Prescription None   uses 1 L prn   Compliance with Home Oxygen Use Yes      Intervention   Short Term Goals To learn and exhibit compliance with exercise, home and travel O2 prescription;To learn and understand importance of monitoring SPO2 with pulse oximeter and demonstrate accurate use of the pulse oximeter.;To learn and understand importance of maintaining oxygen saturations>88%;To learn and demonstrate proper pursed lip breathing techniques or other breathing techniques.     Long  Term Goals Verbalizes importance of monitoring SPO2 with pulse oximeter and return demonstration;Maintenance of O2 saturations>88%;Exhibits proper breathing techniques, such as pursed lip breathing or other method taught during program session;Compliance with respiratory medication             Oxygen Re-Evaluation:  Oxygen Re-Evaluation     Row Name 09/01/21 0916 09/15/21 0925 10/15/21 1405         Program Oxygen Prescription   Program Oxygen Prescription -- -- None       Home Oxygen   Home Oxygen Device None -- Home Concentrator;E-Tanks     Sleep Oxygen  Prescription Continuous -- Continuous     Liters per minute _0 Home Exercise Oxygen Prescription None -- None  PRN     Home Resting Oxygen Prescription None -- None  PRN     Compliance with Home Oxygen Use Yes -- Yes       Goals/Expected Outcomes   Short Term Goals To learn and exhibit compliance with exercise, home and travel O2 prescription;To learn and understand importance  of monitoring SPO2 with pulse oximeter and demonstrate accurate use of the pulse oximeter.;To learn and understand importance of maintaining oxygen saturations>88%;To learn and demonstrate proper pursed lip breathing techniques or other breathing techniques.  -- To learn and understand importance of monitoring SPO2 with pulse oximeter and demonstrate accurate use of the pulse oximeter.;To learn and understand importance of maintaining oxygen saturations>88%;To learn and exhibit compliance with exercise, home and travel O2 prescription     Long  Term Goals Verbalizes importance of monitoring SPO2 with pulse oximeter and return demonstration;Maintenance of O2 saturations>88%;Exhibits proper breathing techniques, such as pursed lip breathing or other method taught during program session;Compliance with respiratory medication -- Maintenance of O2 saturations>88%;Verbalizes importance of monitoring SPO2 with pulse oximeter and return demonstration;Exhibits compliance with exercise, home  and travel O2 prescription     Comments Reviewed PLB technique with pt.  Talked about how it works and it's importance in maintaining their exercise saturations. We reiterated PLB technique and patient is still checking his oxygen at home. He does report that he uses resting oxygen as needed and is aware to stay above that 88%. He is staying compliant using his oxygen at sleep and wants to talk to his doctor to see if he can increase his liter flow as he think he will feel better doing so. Patient has had a long history of cancer. He has had a left  lower lobectomy. Informed him that he should be 17 percent and above with his oxygen. He states that he wears oxygen at home PRN. Informed him that he should watch his oxygen at home more on exertion. He verbalizes understaning of checking oxygen.     Goals/Expected Outcomes Short: Become more profiecient at using PLB.   Long: Become independent at using PLB. Short: Stay above 88% for oxygen and stay compliant to doctor's orders Long: Stay independent at PLB Short: check oxygen at home more routinely. Long: maintain oxygen saturation at home independenty.              Oxygen Discharge (Final Oxygen Re-Evaluation):  Oxygen Re-Evaluation - 10/15/21 1405       Program Oxygen Prescription   Program Oxygen Prescription None      Home Oxygen   Home Oxygen Device Home Concentrator;E-Tanks    Sleep Oxygen Prescription Continuous    Liters per minute 2    Home Exercise Oxygen Prescription None   PRN   Home Resting Oxygen Prescription None   PRN   Compliance with Home Oxygen Use Yes      Goals/Expected Outcomes   Short Term Goals To learn and understand importance of monitoring SPO2 with pulse oximeter and demonstrate accurate use of the pulse oximeter.;To learn and understand importance of maintaining oxygen saturations>88%;To learn and exhibit compliance with exercise, home and travel O2 prescription    Long  Term Goals Maintenance of O2 saturations>88%;Verbalizes importance of monitoring SPO2 with pulse oximeter and return demonstration;Exhibits compliance with exercise, home  and travel O2 prescription    Comments Patient has had a long history of cancer. He has had a left lower lobectomy. Informed him that he should be 76 percent and above with his oxygen. He states that he wears oxygen at home PRN. Informed him that he should watch his oxygen at home more on exertion. He verbalizes understaning of checking oxygen.    Goals/Expected Outcomes Short: check oxygen at home more routinely. Long:  maintain oxygen saturation at home independenty.  Initial Exercise Prescription:  Initial Exercise Prescription - 08/17/21 1500       Date of Initial Exercise RX and Referring Provider   Date 08/17/21    Referring Provider Dr. Patsey Berthold      Oxygen   Maintain Oxygen Saturation 88% or higher      NuStep   Level 1    SPM 80    Minutes 15    METs 1.25      Recumbant Elliptical   Level 1    RPM 50    Minutes 15    METs 1.25      Track   Laps 16    Minutes 15    METs 1.25      Prescription Details   Frequency (times per week) 2    Duration Progress to 30 minutes of continuous aerobic without signs/symptoms of physical distress      Intensity   THRR 40-80% of Max Heartrate 100-129    Ratings of Perceived Exertion 11-13    Perceived Dyspnea 0-4      Progression   Progression Continue to progress workloads to maintain intensity without signs/symptoms of physical distress.      Resistance Training   Training Prescription Yes    Weight 2    Reps 10-15             Perform Capillary Blood Glucose checks as needed.  Exercise Prescription Changes:   Exercise Prescription Changes     Row Name 08/17/21 1500 09/15/21 1200 10/26/21 1200         Response to Exercise   Blood Pressure (Admit) 140/72 106/62 144/80     Blood Pressure (Exercise) 162/80 140/64 162/86     Blood Pressure (Exit) 138/80 126/62 138/78     Heart Rate (Admit) 72 bpm 64 bpm 80 bpm     Heart Rate (Exercise) 84 bpm 75 bpm 99 bpm     Heart Rate (Exit) 74 bpm 70 bpm 88 bpm     Oxygen Saturation (Admit) 95 % 95 % 96 %     Oxygen Saturation (Exercise) 93 % 93 % 94 %     Oxygen Saturation (Exit) 95 % 91 % 95 %     Rating of Perceived Exertion (Exercise) _0 Perceived Dyspnea (Exercise) _1 Symptoms none none SOB     Comments 6 MWT results third day --     Duration -- Progress to 30 minutes of  aerobic without signs/symptoms of physical distress Progress to 30  minutes of  aerobic without signs/symptoms of physical distress     Intensity -- THRR unchanged THRR unchanged       Progression   Progression -- Continue to progress workloads to maintain intensity without signs/symptoms of physical distress. Continue to progress workloads to maintain intensity without signs/symptoms of physical distress.     Average METs -- 1.38 2.2       Resistance Training   Training Prescription -- Yes Yes     Weight -- 2 lb 2 lb     Reps -- 10-15 10-15       Interval Training   Interval Training -- -- No       NuStep   Level -- -- 3     Minutes -- -- 15     METs -- -- 2.3       Arm Ergometer   Level -- -- 1     Minutes -- --  30     METs -- -- 2.3       Track   Laps -- -- 10     Minutes -- -- 10     METs -- -- 1.54       Oxygen   Maintain Oxygen Saturation -- -- 88% or higher              Exercise Comments:   Exercise Comments     Row Name 09/01/21 0915           Exercise Comments First full day of exercise!  Patient was oriented to gym and equipment including functions, settings, policies, and procedures.  Patient's individual exercise prescription and treatment plan were reviewed.  All starting workloads were established based on the results of the 6 minute walk test done at initial orientation visit.  The plan for exercise progression was also introduced and progression will be customized based on patient's performance and goals.                Exercise Goals and Review:   Exercise Goals     Row Name 08/17/21 1559             Exercise Goals   Increase Physical Activity Yes       Intervention Provide advice, education, support and counseling about physical activity/exercise needs.;Develop an individualized exercise prescription for aerobic and resistive training based on initial evaluation findings, risk stratification, comorbidities and participant's personal goals.       Expected Outcomes Short Term: Attend rehab on a  regular basis to increase amount of physical activity.;Long Term: Add in home exercise to make exercise part of routine and to increase amount of physical activity.;Long Term: Exercising regularly at least 3-5 days a week.       Increase Strength and Stamina Yes       Intervention Provide advice, education, support and counseling about physical activity/exercise needs.;Develop an individualized exercise prescription for aerobic and resistive training based on initial evaluation findings, risk stratification, comorbidities and participant's personal goals.       Expected Outcomes Short Term: Increase workloads from initial exercise prescription for resistance, speed, and METs.;Short Term: Perform resistance training exercises routinely during rehab and add in resistance training at home;Long Term: Improve cardiorespiratory fitness, muscular endurance and strength as measured by increased METs and functional capacity (6MWT)       Able to understand and use rate of perceived exertion (RPE) scale Yes       Intervention Provide education and explanation on how to use RPE scale       Expected Outcomes Short Term: Able to use RPE daily in rehab to express subjective intensity level;Long Term:  Able to use RPE to guide intensity level when exercising independently       Able to understand and use Dyspnea scale Yes       Intervention Provide education and explanation on how to use Dyspnea scale       Expected Outcomes Short Term: Able to use Dyspnea scale daily in rehab to express subjective sense of shortness of breath during exertion;Long Term: Able to use Dyspnea scale to guide intensity level when exercising independently       Knowledge and understanding of Target Heart Rate Range (THRR) Yes       Intervention Provide education and explanation of THRR including how the numbers were predicted and where they are located for reference       Expected Outcomes Short Term: Able  to state/look up THRR;Long Term:  Able to use THRR to govern intensity when exercising independently;Short Term: Able to use daily as guideline for intensity in rehab       Able to check pulse independently Yes       Intervention Provide education and demonstration on how to check pulse in carotid and radial arteries.;Review the importance of being able to check your own pulse for safety during independent exercise       Expected Outcomes Short Term: Able to explain why pulse checking is important during independent exercise;Long Term: Able to check pulse independently and accurately       Understanding of Exercise Prescription Yes       Intervention Provide education, explanation, and written materials on patient's individual exercise prescription       Expected Outcomes Short Term: Able to explain program exercise prescription;Long Term: Able to explain home exercise prescription to exercise independently                Exercise Goals Re-Evaluation :  Exercise Goals Re-Evaluation     Row Name 09/01/21 0915 09/15/21 0923 10/26/21 1233 11/24/21 1521       Exercise Goal Re-Evaluation   Exercise Goals Review Increase Physical Activity;Able to understand and use rate of perceived exertion (RPE) scale;Knowledge and understanding of Target Heart Rate Range (THRR);Understanding of Exercise Prescription;Able to understand and use Dyspnea scale;Able to check pulse independently;Increase Strength and Stamina Increase Strength and Stamina;Increase Physical Activity Increase Strength and Stamina;Increase Physical Activity --    Comments Reviewed RPE and dyspnea scales, THR and program prescription with pt today.  Pt voiced understanding and was given a copy of goals to take home. Shashank is only on session 5 and is not ready to go over home exercise yet. He is walking a little bit at home and I encouraged him to think about what kind of home exercise he will be comfortable doing. We talked about the [facility] which he will think about.  Avish is doing well in rehab. He enjoys the arm crank and tolerated it well for the full 30 minutes last session.  He was also able to walk 10 laps on the track and increased to level 3 on the T4 Nustep. Unforuntately, he just found out he tested positive for covid and will have to be out for another 2 weeks. Staff will encourage to pick up where he left off. Out since 10/21/21    Expected Outcomes Short: Use RPE daily to regulate intensity. Long: Follow program prescription in THR. Short: Continue walking home and think about home exercise Long: Exercise independently at home at appropriate prescription Short: Continue working up loads as tolerated and increase laps on track Long: Increase overall MET level --             Discharge Exercise Prescription (Final Exercise Prescription Changes):  Exercise Prescription Changes - 10/26/21 1200       Response to Exercise   Blood Pressure (Admit) 144/80    Blood Pressure (Exercise) 162/86    Blood Pressure (Exit) 138/78    Heart Rate (Admit) 80 bpm    Heart Rate (Exercise) 99 bpm    Heart Rate (Exit) 88 bpm    Oxygen Saturation (Admit) 96 %    Oxygen Saturation (Exercise) 94 %    Oxygen Saturation (Exit) 95 %    Rating of Perceived Exertion (Exercise) 16    Perceived Dyspnea (Exercise) 3    Symptoms SOB    Duration Progress  to 30 minutes of  aerobic without signs/symptoms of physical distress    Intensity THRR unchanged      Progression   Progression Continue to progress workloads to maintain intensity without signs/symptoms of physical distress.    Average METs 2.2      Resistance Training   Training Prescription Yes    Weight 2 lb    Reps 10-15      Interval Training   Interval Training No      NuStep   Level 3    Minutes 15    METs 2.3      Arm Ergometer   Level 1    Minutes 30    METs 2.3      Track   Laps 10    Minutes 10    METs 1.54      Oxygen   Maintain Oxygen Saturation 88% or higher              Nutrition:  Target Goals: Understanding of nutrition guidelines, daily intake of sodium <1520m, cholesterol <2055m calories 30% from fat and 7% or less from saturated fats, daily to have 5 or more servings of fruits and vegetables.  Education: All About Nutrition: -Group instruction provided by verbal, written material, interactive activities, discussions, models, and posters to present general guidelines for heart healthy nutrition including fat, fiber, MyPlate, the role of sodium in heart healthy nutrition, utilization of the nutrition label, and utilization of this knowledge for meal planning. Follow up email sent as well. Written material given at graduation.   Biometrics:  Pre Biometrics - 08/17/21 1600       Pre Biometrics   Height 5' 9.5" (1.765 m)    Weight 208 lb 4.8 oz (94.5 kg)    BMI (Calculated) 30.33    Single Leg Stand 0 seconds              Nutrition Therapy Plan and Nutrition Goals:  Nutrition Therapy & Goals - 09/15/21 1019       Nutrition Therapy   Diet Heart healthy, low Na, T2DM MNT, pulmonary MNT.    Protein (specify units) 75g    Fiber 30 grams    Whole Grain Foods 3 servings    Saturated Fats 12 max. grams    Fruits and Vegetables 8 servings/day    Sodium 1.5 grams      Personal Nutrition Goals   Nutrition Goal ST: track indigestion with last time eating, bedtime, and trigger foods LT: prevent indigestion    Comments 89.o. M admitted to rehab for COPD presents with HTN, CHF, GERD, T2DM, HLD, iron deficiency anemia. PMH of . Relevant medications include calcium/vit D, coreg, zeita, lantus, metformin, protonix, K+, torsemide. [employer] days a week for lunch; spaghetti and meatballs, black eyed peas, peas, sliced peaches and jello. He makes his meals for the weekend - chili, shrimp, ham, chicken, tu[place]ried rice. Most days he doesn't feel like eating much so he sticks with some fruit and peanut butter. He  reports that he has gas with eating - he feels coke helps with his indigestion. He gets indigestion close to bedtime.  The only vegetable he won't eat is spinach. He reports that lunch is filling and he will have fruit (variety) and some peanut butter crackers. He drinks water mostly. He does not have teeth so he has difficulty eating hard things. He takes his medicine at 830/9am - with a cracker or 2. he takes  20 units of Lantus at night - he checks his BG in am and pm (he reports is 90-200 depending on the day). He tries to eat foods that are sugar free aside from some peanut butter crackers. 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 Reviewed some key MNT for diabetes and pulmonary nutrition. Will review education as he goes through the program.      Intervention Plan   Intervention Prescribe, educate and counsel regarding individualized specific dietary modifications aiming towards targeted core components such as weight, hypertension, lipid management, diabetes, heart failure and other comorbidities.    Expected Outcomes Short Term Goal: Understand basic principles of dietary content, such as calories, fat, sodium, cholesterol and nutrients.;Short Term Goal: A plan has been developed with personal nutrition goals set during dietitian appointment.;Long Term Goal: Adherence to prescribed nutrition plan.             Nutrition Assessments:  MEDIFICTS Score Key: ?70 Need to make dietary changes  40-70 Heart Healthy Diet ? 40 Therapeutic Level Cholesterol Diet  Flowsheet Row Pulmonary Rehab from 08/17/2021 in [facility]  Picture Your Plate Total Score on Admission 63      Picture Your Plate Scores: <73 Unhealthy dietary pattern with much room for improvement. 41-50 Dietary pattern unlikely to meet recommendations for good health and room for improvement. 51-60 More healthful dietary pattern, with some room for improvement.  >60  Healthy dietary pattern, although there may be some specific behaviors that could be improved.   Nutrition Goals Re-Evaluation:  Nutrition Goals Re-Evaluation     [place] Name 10/15/21 1421             Goals   Nutrition Goal Do smaller snacks throughout the day.       Comment Patient was informed on why it is important to maintain a balanced diet when dealing with Respiratory issues. Explained that it takes a lot of energy to breath and when they are short of breath often they will need to have a good diet to help keep up with the calories they are expending for breathing.       Expected Outcome Short: Choose and plan snacks accordingly to patients caloric intake to improve breathing. Long: Maintain a diet independently that meets their caloric intake to aid in daily shortness of breath.                Nutrition Goals Discharge (Final Nutrition Goals Re-Evaluation):  Nutrition Goals Re-Evaluation - 10/15/21 1421       Goals   Nutrition Goal Do smaller snacks throughout the day.    Comment Patient was informed on why it is important to maintain a balanced diet when dealing with Respiratory issues. Explained that it takes a lot of energy to breath and when they are short of breath often they will need to have a good diet to help keep up with the calories they are expending for breathing.    Expected Outcome Short: Choose and plan snacks accordingly to patients caloric intake to improve breathing. Long: Maintain a diet independently that meets their caloric intake to aid in daily shortness of breath.             Psychosocial: Target Goals: Acknowledge presence or absence of significant depression and/or stress, maximize coping skills, provide positive support system. Participant is able to verbalize types and ability to use techniques and skills needed for reducing stress and depression.  Education: Stress, Anxiety, and Depression - Group verbal and visual presentation to define  topics covered.  Reviews how body is impacted by stress, anxiety, and depression.  Also discusses healthy ways to reduce stress and to treat/manage anxiety and depression.  Written material given at graduation.   Education: Sleep Hygiene -Provides group verbal and written instruction about how sleep can affect your health.  Define sleep hygiene, discuss sleep cycles and impact of sleep habits. Review good sleep hygiene tips.    Initial Review & Psychosocial Screening:  Initial Psych Review & Screening - 07/31/21 1328       Initial Review   Current issues with Current Stress Concerns    Source of Stress Concerns Family;Transportation      Family Dynamics   Good Support System? Yes   friend   Concerns Inappropriate over/under dependence on family/friends      Barriers   Psychosocial barriers to participate in program There are no identifiable barriers or psychosocial needs.;The patient should benefit from training in stress management and relaxation.      Screening Interventions   Interventions Encouraged to exercise;Provide feedback about the scores to participant;To provide support and resources with identified psychosocial needs    Expected Outcomes Short Term goal: Utilizing psychosocial counselor, staff and physician to assist with identification of specific Stressors or current issues interfering with healing process. Setting desired goal for each stressor or current issue identified.;Long Term Goal: Stressors or current issues are controlled or eliminated.;Short Term goal: Identification and review with participant of any Quality of Life or Depression concerns found by scoring the questionnaire.;Long Term goal: The participant improves quality of Life and PHQ9 Scores as seen by post scores and/or verbalization of changes             Quality of Life Scores:  Scores of 19 and below usually indicate a poorer quality of life in these areas.  A difference of  2-3 points is a  clinically meaningful difference.  A difference of 2-3 points in the total score of the Quality of Life Index has been associated with significant improvement in overall quality of life, self-image, physical symptoms, and general health in studies assessing change in quality of life.  PHQ-9: Recent Review Flowsheet Data     Depression screen [facility] 2/9 09/22/2021 08/17/2021 05/18/2021 12/09/2020 03/31/2020   Decreased Interest 0 0 3 0 0   Down, Depressed, Hopeless 0 3 0 0 0   PHQ - 2 Score 0 3 3 0 0   Altered sleeping _0 - -   Tired, decreased energy _1 - -   Change in appetite 0 3 3 - -   Feeling bad or failure about yourself  0 3 0 - -   Trouble concentrating 0 0 0 - -   Moving slowly or fidgety/restless 0 0 0 - -   Suicidal thoughts 0 0 3 - -   PHQ-9 Score _2 - -   Difficult doing work/chores Somewhat difficult Somewhat difficult Not difficult at all - -      Interpretation of Total Score  Total Score Depression Severity:  1-4 = Minimal depression, 5-9 = Mild depression, 10-14 = Moderate depression, 15-19 = Moderately severe depression, 20-27 = Severe depression   Psychosocial Evaluation and Intervention:  Psychosocial Evaluation - 07/31/21 1338       Psychosocial Evaluation & Interventions   Interventions Encouraged to exercise with the program and follow exercise prescription;Stress management education  Comments Kalvin is coming to pulmonary rehab for COPD. He has been placed on O2 at night and sometimes will put himself on it during the day if he is feeling sluggish. He does report some stress concerns related to transportation and family. His friend who used to drive him places is also dealing with health issues so Shahrukh has been using [employer] more. Regarding family, he states he has been having flashback dreams about his wife who died 8 years ago. This disrupts his sleep which is already struggling since he only gets about 3-4 hours of sleep a night (has been his norm  since high school). His other family stressor is his daughter who he states he doesn't want to claim her as such after the way she has treated him, including selling some of his checks which he turned her in to the government for. She is local, but he hasnt seen her in 4 years. He lives in [place] where he enjoys the dance and yoga classes they offer. 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 He is hopeful this program will boost his stamina and improve his breathing.    Expected Outcomes Short; attend pulmonary rehab for education and exercise. Long: develop and maintain positive self care habits    Continue Psychosocial Services  Follow up required by staff             Psychosocial Re-Evaluation:  Psychosocial Re-Evaluation     [place] Name 09/15/21 0935 09/22/21 0934           Psychosocial Re-Evaluation   Current issues with Current Sleep Concerns Current Stress Concerns      Comments Fadil is doing well mentally. He denies any depression or anxiety . 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 He only sleeps 4 hours per night and takes naps throughout the day. He doesn't have any interest in taking any medicine but he is interested in increasing his liter flow for his oxygen during sleep. He knows to stay above 88%. I encouraged him to call his doctor today to discuss what changes they can make and/or recommend to make his sleep a little better. Reviewed patient health questionnaire (PHQ-9) with patient for follow up. Previously, patients score indicated signs/symptoms of depression.  Reviewed to see if patient is improving symptom wise while in program.  Score improved and patient states that it is because they have been coming to rehab and he has more energy again.      Expected Outcomes Short: Talk to doctor about sleep Long: Continue  to utilize exercise for stress management and maintain positive attitude Short: Continue to attend [facility] regularly for regular exercise and social engagement. Long: Continue to improve symptoms and manage a positive mental state.      Interventions Encouraged to attend Pulmonary Rehabilitation for the exercise Encouraged to attend Pulmonary Rehabilitation for the exercise      Continue Psychosocial Services  Follow up required by staff Follow up required by staff               Psychosocial Discharge (Final Psychosocial Re-Evaluation):  Psychosocial Re-Evaluation - 09/22/21 0934       Psychosocial Re-Evaluation   Current issues with Current Stress Concerns    Comments Reviewed patient health questionnaire (PHQ-9) with patient for follow up. Previously, patients score indicated signs/symptoms of depression.  Reviewed to see if patient is improving symptom wise while in program.  Score improved and patient states that it is because they have been coming to rehab and he has more energy again.    Expected Outcomes Short: Continue to attend [facility] regularly for regular exercise and social engagement. Long: Continue to improve symptoms and manage a positive mental state.    Interventions Encouraged to attend Pulmonary Rehabilitation for the exercise    Continue Psychosocial Services  Follow up required by staff             Education: Education Goals: Education classes will be provided on a weekly basis, covering required topics. Participant will state understanding/return demonstration of topics presented.  Learning Barriers/Preferences:  Learning Barriers/Preferences - 07/31/21 1327       Learning Barriers/Preferences   Learning Barriers None    Learning Preferences Individual Instruction             General Pulmonary Education Topics:  Infection Prevention: - Provides verbal and written material to individual with discussion of infection control including proper hand  washing and proper equipment cleaning during exercise session. Flowsheet Row Pulmonary Rehab from 10/21/2021 in [facility] Pulmonary Rehab  Date 08/17/21  Educator [facility]  Instruction Review Code 1- Verbalizes Understanding       Falls Prevention: - Provides verbal and written material to individual with discussion of falls prevention and safety. Flowsheet Row Pulmonary Rehab from 10/21/2021 in [facility] and Pulmonary Rehab  Date 08/17/21  Educator [facility]  Instruction Review Code 1- Verbalizes Understanding       Chronic Lung Disease Review: - Group verbal instruction with posters, models, PowerPoint presentations and videos,  to review new updates, new respiratory medications, new advancements in procedures and treatments. Providing information on websites and "800" numbers for continued self-education. Includes information about supplement oxygen, available portable oxygen systems, continuous and intermittent flow rates, oxygen safety, concentrators, and Medicare reimbursement for oxygen. Explanation of Pulmonary Drugs, including class, frequency, complications, importance of spacers, rinsing mouth after steroid MDI's, and proper cleaning methods for nebulizers. Review of basic lung anatomy and physiology related to function, structure, and complications of lung disease. Review of risk factors. Discussion about methods for diagnosing sleep apnea and types of masks and machines for OSA. Includes a review of the use of types of environmental controls: home humidity, furnaces, filters, dust mite/pet prevention, HEPA vacuums. Discussion about weather changes, air quality and the benefits of nasal washing. Instruction on Warning signs, infection symptoms, calling MD promptly, preventive modes, and value of vaccinations. Review of effective airway clearance, coughing and/or vibration techniques. Emphasizing that all should Create an Action Plan. Written material given at graduation. Flowsheet Row  Pulmonary Rehab from 10/21/2021 in [facility] Card[facility] and Pulmonary Rehab  Education need identified 08/17/21  Date 09/10/21  Educator [facility]  Instruction Review Code 1- Verbalizes Understanding       AED/CPR: - Group verbal and written instruction with the use of models to demonstrate the basic use of the AED with the basic ABC's of resuscitation.    Anatomy and Cardiac Procedures: - Group verbal and visual presentation and models provide information about basic cardiac anatomy and function. Reviews the testing methods done to diagnose heart disease and the outcomes of the test results. Describes the treatment choices: Medical Management, Angioplasty, or Coronary Bypass Surgery for treating various heart conditions including Myocardial Infarction, Angina, Valve Disease, and Cardiac Arrhythmias.  Written material given at graduation.   Medication Safety: - Group verbal  and visual instruction to review commonly prescribed medications for heart and lung disease. Reviews the medication, class of the drug, and side effects. Includes the steps to properly store meds and maintain the prescription regimen.  Written material given at graduation. Flowsheet Row Pulmonary Rehab from 10/21/2021 in [facility] Cardiac and Pulmonary Rehab  Date 10/21/21  Educator [facility]  Instruction Review Code 1- Verbalizes Understanding       Other: -Provides group and verbal instruction on various topics (see comments)   Knowledge Questionnaire Score:  Knowledge Questionnaire Score - 08/17/21 1606       Knowledge Questionnaire Score   Pre Score 9/18              Core Components/Risk Factors/Patient Goals at Admission:  Personal Goals and Risk Factors at Admission - 08/17/21 1602       Core Components/Risk Factors/Patient Goals on Admission    Weight Management Yes    Intervention Weight Management: Develop a combined nutrition and exercise program designed to reach desired caloric intake, while maintaining  appropriate intake of nutrient and fiber, sodium and fats, and appropriate energy expenditure required for the weight goal.;Weight Management: Provide education and appropriate resources to help participant work on and attain dietary goals.;Weight Management/Obesity: Establish reasonable short term and long term weight goals.;Obesity: Provide education and appropriate resources to help participant work on and attain dietary goals.    Admit Weight 208 lb 4.8 oz (94.5 kg)    Goal Weight: Short Term 203 lb (92.1 kg)    Goal Weight: Long Term 198 lb (89.8 kg)    Expected Outcomes Short Term: Continue to assess and modify interventions until short term weight is achieved;Long Term: Adherence to nutrition and physical activity/exercise program aimed toward attainment of established weight goal;Understanding recommendations for meals to include 15-35% energy as protein, 25-35% energy from fat, 35-60% energy from carbohydrates, less than 234m of dietary cholesterol, 20-35 gm of total fiber daily;Understanding of distribution of calorie intake throughout the day with the consumption of 4-5 meals/snacks;Weight Loss: Understanding of general recommendations for a balanced deficit meal plan, which promotes 1-2 lb weight loss per week and includes a negative energy balance of [phone] kcal/d    Tobacco Cessation Yes    Number of packs per day 2-3 cigs a day    Intervention Assist the participant in steps to quit. Provide individualized education and counseling about committing to Tobacco Cessation, relapse prevention, and pharmacological support that can be provided by physician.;OAdvice worker assist with locating and accessing local/national Quit Smoking programs, and support quit date choice.    Expected Outcomes Short Term: Will demonstrate readiness to quit, by selecting a quit date.;Short Term: Will quit all tobacco product use, adhering to prevention of relapse plan.;Long Term: Complete abstinence  from all tobacco products for at least 12 months from quit date.    Improve shortness of breath with ADL's Yes    Intervention Provide education, individualized exercise plan and daily activity instruction to help decrease symptoms of SOB with activities of daily living.    Expected Outcomes Short Term: Improve cardiorespiratory fitness to achieve a reduction of symptoms when performing ADLs;Long Term: Be able to perform more ADLs without symptoms or delay the onset of symptoms    Diabetes Yes    Intervention Provide education about signs/symptoms and action to take for hypo/hyperglycemia.;Provide education about proper nutrition, including hydration, and aerobic/resistive exercise prescription along with prescribed medications to achieve blood glucose in normal ranges: Fasting glucose 65-99 mg/dL  Expected Outcomes Short Term: Participant verbalizes understanding of the signs/symptoms and immediate care of hyper/hypoglycemia, proper foot care and importance of medication, aerobic/resistive exercise and nutrition plan for blood glucose control.;Long Term: Attainment of HbA1C < 7%.    Hypertension Yes    Intervention Provide education on lifestyle modifcations including regular physical activity/exercise, weight management, moderate sodium restriction and increased consumption of fresh fruit, vegetables, and low fat dairy, alcohol moderation, and smoking cessation.;Monitor prescription use compliance.    Expected Outcomes Short Term: Continued assessment and intervention until BP is < 140/46m HG in hypertensive participants. < 130/861mHG in hypertensive participants with diabetes, heart failure or chronic kidney disease.;Long Term: Maintenance of blood pressure at goal levels.             Education:Diabetes - Individual verbal and written instruction to review signs/symptoms of diabetes, desired ranges of glucose level fasting, after meals and with exercise. Acknowledge that pre and post  exercise glucose checks will be done for 3 sessions at entry of program. Flowsheet Row [facility]  Date 07/31/21  [facility]- Verbalizes Understanding       Know Your Numbers and Heart Failure: - Group verbal and visual instruction to discuss disease risk factors for cardiac and pulmonary disease and treatment options.  Reviews associated critical values for Overweight/Obesity, Hypertension, Cholesterol, and Diabetes.  Discusses basics of heart failure: signs/symptoms and treatments.  Introduces Heart Failure Zone chart for action plan for heart failure.  Written material given at graduation. Flow[facility]  Date 09/03/21  Educator [facility]  Instruction Review Code 1- Verbalizes Understanding       Core Components/Risk Factors/Patient Goals Review:   Goals and Risk Factor Review     Row Name 09/15/21 0930 10/15/21 1412           Core Components/Risk Factors/Patient Goals Review   Personal Goals Review Weight Management/Obesity;Tobacco Cessation;Diabetes;Improve shortness of breath with ADL's Improve shortness of breath with ADL's;Tobacco Cessation      Review HaZachariustopped smoking about 1 week ago and is proud if himself for maintaining it thus far. He isn't using any resources to help him quit, however, I did provide him a smoking resource packet to reference to in case he needs it. He is checking his blood sugars twice daily and does not report any abnormal numbers. Harvery does not weigh himself at home as he does not have a scale. His goal is 195 lb- he is meeting with the RD after his session and wants to focus on diet changes to help with weight loss. He is still new in the program and hopes this will help relieve any SOB. Spoke to patient about their shortness of breath and what they can do to improve. Patient has been informed of  breathing techniques when starting the program. Patient is informed to tell staff if they have had any med changes and that certain meds they are taking or not taking can be causing shortness of breath. He has not had cigarette for 2 weeks. He plans to continue to not smoke.      Expected Outcomes Short: Continue smoking cessation Long: Continue to manage lifestyle risk factors Short: Attend [facility] regularly to improve shortness of breath with ADLs. Long: maintain independence with ADLs               Core Components/Risk Factors/Patient Goals at  Discharge (Final Review):   Goals and Risk Factor Review - 10/15/21 1412       Core Components/Risk Factors/Patient Goals Review   Personal Goals Review Improve shortness of breath with ADL's;Tobacco Cessation    Review Spoke to patient about their shortness of breath and what they can do to improve. Patient has been informed of breathing techniques when starting the program. Patient is informed to tell staff if they have had any med changes and that certain meds they are taking or not taking can be causing shortness of breath. He has not had cigarette for 2 weeks. He plans to continue to not smoke.    Expected Outcomes Short: Attend [facility] regularly to improve shortness of breath with ADLs. Long: maintain independence with ADLs             ITP Comments:  ITP Comments     Row Name 07/31/21 1323 08/17/21 1548 09/01/21 0915 09/02/21 0625 09/15/21 1126   ITP Comments Initial telephone orientation completed. Diagnosis can be found in [facility] 9/23. EP orientation scheduled for Monday 11/14 at 1:30 Completed 6MWT and gym orientation. Initial ITP created and sent for review to Dr. Zetta Bills, Medical Director. First full day of exercise!  Patient was oriented to gym and equipment including functions, settings, policies, and procedures.  Patient's individual exercise prescription and treatment plan were reviewed.  All starting workloads were  established based on the results of the 6 minute walk test done at initial orientation visit.  The plan for exercise progression was also introduced and progression will be customized based on patient's performance and goals. 30 Day review completed. Medical Director ITP review done, changes made as directed, and signed approval by Medical Director.   New to program Completed initial RD evaluation    Row Name 09/30/21 0644 10/26/21 1224 10/28/21 0819 11/23/21 1516 11/24/21 1521   ITP Comments 30 Day review completed. Medical Director ITP review done, changes made as directed, and signed approval by Medical Director. Adriene called to let us know that he will be out for the week as he tested positive for COVID on 10/23/21.  He can return to rehab on 11/04/21. 30 Day review completed. Medical Director ITP review done, changes made as directed, and signed approval by Medical Director.   Out for COVID No response back from patient and still has not attended class. Sent letter. Out since 10/21/21.    [place] Name 11/25/21 0741 12/03/21 1101         ITP Comments 30 Day review completed. Medical Director ITP review done, changes made as directed, and signed approval by Medical Director.   out no visits this month Pt has not attended since 10/21/21.  He will be discharged at this time.               Comments: Discharge ITP

## 2021-12-04 ENCOUNTER — Telehealth: Payer: Self-pay | Admitting: Pharmacist

## 2021-12-04 NOTE — Telephone Encounter (Signed)
?  Chronic Care Management  ? ?Outreach Note ? ?12/04/2021 ?Name: Humbert Morozov MRN: [id number] DOB: 1946-07-16 ? ?Referred by: Jearld Fenton, NP ?Reason for referral : No chief complaint on file. ? ? ?An unsuccessful telephone outreach was attempted today. The patient was referred to the case management team for assistance with care management and care coordination.  ? ?Follow Up Plan: CCM Pharmacist ill outreach to patient by telephone within the next 14 days. ? ?Wallace Cullens, PharmD, BCACP ?Clinical Pharmacist ?[facility] ?[phone] ? ?

## 2021-12-08 ENCOUNTER — Other Ambulatory Visit: Payer: Self-pay | Admitting: Nephrology

## 2021-12-08 DIAGNOSIS — N1832 Chronic kidney disease, stage 3b: Secondary | ICD-10-CM

## 2021-12-08 DIAGNOSIS — E1122 Type 2 diabetes mellitus with diabetic chronic kidney disease: Secondary | ICD-10-CM | POA: Diagnosis not present

## 2021-12-08 DIAGNOSIS — N189 Chronic kidney disease, unspecified: Secondary | ICD-10-CM | POA: Insufficient documentation

## 2021-12-08 DIAGNOSIS — D649 Anemia, unspecified: Secondary | ICD-10-CM | POA: Insufficient documentation

## 2021-12-08 DIAGNOSIS — I10 Essential (primary) hypertension: Secondary | ICD-10-CM | POA: Diagnosis not present

## 2021-12-08 HISTORY — DX: Chronic kidney disease, unspecified: N18.9

## 2021-12-10 ENCOUNTER — Other Ambulatory Visit: Payer: Self-pay | Admitting: Internal Medicine

## 2021-12-10 DIAGNOSIS — E1142 Type 2 diabetes mellitus with diabetic polyneuropathy: Secondary | ICD-10-CM

## 2021-12-10 DIAGNOSIS — B356 Tinea cruris: Secondary | ICD-10-CM

## 2021-12-10 NOTE — Telephone Encounter (Signed)
Requested medication (s) are due for refill today: {Yesyes ? ?Requested medication (s) are on the active medication list: yes   ? ?Last refill: Ketoconazole 08/10/21  15g  0 refills  Breztri  122/21/22 10.7  o refills ? ?Future visit scheduled yes ? ?Notes to clinic:Ketoconazole not delegated    Breztri off protocol.Please review.Thank you. ? ?Requested Prescriptions  ?Pending Prescriptions Disp Refills  ? BREZTRI AEROSPHERE 160-9-4.8 MCG/ACT AERO [Pharmacy Med Name: BREZTRI AEROSPHERE 160-9-4.8 MCG/AC] 10.7 g 0  ?  Sig: INHALE 2 PUFFS INTO THE LUNGS TWICE DAILY  ?  ? Off-Protocol Failed - 12/10/2021 11:13 AM  ?  ?  Failed - Medication not assigned to a protocol, review manually.  ?  ?  Passed - Valid encounter within last 12 months  ?  Recent Outpatient Visits   ? ?      ? 1 week ago DM type 2 with diabetic peripheral neuropathy ([place])  ? [place], DO  ? 2 weeks ago Callus of foot  ? [facility] [place], Coralie Keens, NP  ? 3 weeks ago Encounter for general adult medical examination with abnormal findings  ? [facility] [place], [place] W, NP  ? 4 months ago Blood in semen  ? [facility] [place], [place] W, NP  ? 5 months ago Abdominal bloating  ? [facility] [place], Coralie Keens, NP  ? ?  ?  ?Future Appointments   ? ?        ? In 1 week  [facility], [place]  ? In 2 months Baity, Coralie Keens, NP [facility]  ? ?  ? ?  ?  ?  ? ketoconazole (NIZORAL) 2 % cream [Pharmacy Med Name: KETOCONAZOLE 2% TOP CREAM GM] 30 g   ?  Sig: APPLY 1 APPLICATION TOPICALLY ONCE DAILY  ?  ? Not Delegated - Over the Counter: OTC 2 Failed - 12/10/2021 11:13 AM  ?  ?  Failed - This refill cannot be delegated  ?  ?  Passed - Valid encounter within last 12 months  ?  Recent Outpatient Visits   ? ?      ? 1 week ago DM type 2 with diabetic peripheral neuropathy ([place])  ? [place], DO  ?  2 weeks ago Callus of foot  ? [facility] [place], Coralie Keens, NP  ? 3 weeks ago Encounter for general adult medical examination with abnormal findings  ? [facility] [place], [place] W, NP  ? 4 months ago Blood in semen  ? [facility] [place], [place] W, NP  ? 5 months ago Abdominal bloating  ? [facility] [place], Coralie Keens, NP  ? ?  ?  ?Future Appointments   ? ?        ? In 1 week  [facility] [place], [place]  ? In 2 months Baity, Coralie Keens, NP [facility], [place]  ? ?  ? ?  ?  ?  ?Signed Prescriptions Disp Refills  ? gabapentin (NEURONTIN) 100 MG capsule 180 capsule 0  ?  Sig: TAKE 1-2 CAPSULES BY MOUTH AT BEDTIME  ?  ? Neurology: Anticonvulsants - gabapentin Failed - 12/10/2021 11:13 AM  ?  ?  Failed - Cr in normal range and within 360 days  ?  Creat  ?Date Value  Ref Range Status  ?11/13/2021 1.70 (H) 0.70 - 1.28 mg/dL Final  ? ?Creatinine, Urine  ?Date Value Ref Range Status  ?11/13/2021 53 20 - 320 mg/dL Final  ?  ?  ?  ?  Passed - Completed PHQ-2 or PHQ-9 in the last 360 days  ?  ?  Passed - Valid encounter within last 12 months  ?  Recent Outpatient Visits   ? ?      ? 1 week ago DM type 2 with diabetic peripheral neuropathy ([place])  ? [place], DO  ? 2 weeks ago Callus of foot  ? [facility] [place], Coralie Keens, NP  ? 3 weeks ago Encounter for general adult medical examination with abnormal findings  ? [facility] [place], [place] W, NP  ? 4 months ago Blood in semen  ? [facility] [place], [place] W, NP  ? 5 months ago Abdominal bloating  ? [facility] [place], Coralie Keens, NP  ? ?  ?  ?Future Appointments   ? ?        ? In 1 week  [facility], [place]  ? In 2 months Baity, Coralie Keens, NP [facility], [place]  ? ?  ? ?  ?  ?  ? ? ? ? ?

## 2021-12-10 NOTE — Telephone Encounter (Signed)
Requested Prescriptions  ?Pending Prescriptions Disp Refills  ?? BREZTRI AEROSPHERE 160-9-4.8 MCG/ACT AERO [Pharmacy Med Name: BREZTRI AEROSPHERE 160-9-4.8 MCG/AC] 10.7 g 0  ?  Sig: INHALE 2 PUFFS INTO THE LUNGS TWICE DAILY  ?  ? Off-Protocol Failed - 12/10/2021 11:13 AM  ?  ?  Failed - Medication not assigned to a protocol, review manually.  ?  ?  Passed - Valid encounter within last 12 months  ?  Recent Outpatient Visits   ?      ? 1 week ago DM type 2 with diabetic peripheral neuropathy ([place])  ? [place], DO  ? 2 weeks ago Callus of foot  ? [facility] [place], Coralie Keens, NP  ? 3 weeks ago Encounter for general adult medical examination with abnormal findings  ? [facility] [place], [place] W, NP  ? 4 months ago Blood in semen  ? [facility] [place], [place] W, NP  ? 5 months ago Abdominal bloating  ? [facility] [place], Coralie Keens, NP  ?  ?  ?Future Appointments   ?        ? In 1 week  [facility], [place]  ? In 2 months Baity, Coralie Keens, NP [place]  ?  ? ?  ?  ?  ?? gabapentin (NEURONTIN) 100 MG capsule [Pharmacy Med Name: GABAPENTIN 100 MG CAP] 180 capsule 0  ?  Sig: TAKE 1-2 CAPSULES BY MOUTH AT BEDTIME  ?  ? Neurology: Anticonvulsants - gabapentin Failed - 12/10/2021 11:13 AM  ?  ?  Failed - Cr in normal range and within 360 days  ?  Creat  ?Date Value Ref Range Status  ?11/13/2021 1.70 (H) 0.70 - 1.28 mg/dL Final  ? ?Creatinine, Urine  ?Date Value Ref Range Status  ?11/13/2021 53 20 - 320 mg/dL Final  ?   ?  ?  Passed - Completed PHQ-2 or PHQ-9 in the last 360 days  ?  ?  Passed - Valid encounter within last 12 months  ?  Recent Outpatient Visits   ?      ? 1 week ago DM type 2 with diabetic peripheral neuropathy ([place])  ? [place], DO  ? 2 weeks ago Callus of foot  ? [facility] [place], Coralie Keens, NP  ? 3 weeks ago Encounter  for general adult medical examination with abnormal findings  ? [facility] [place], [place] W, NP  ? 4 months ago Blood in semen  ? [facility] [employer], [place] W, NP  ? 5 months ago Abdominal bloating  ? [facility] [place], Coralie Keens, NP  ?  ?  ?Future Appointments   ?        ? In 1 week  [facility], [place]  ? In 2 months Baity, Coralie Keens, NP [facility], [facility]  ?  ? ?  ?  ?  ?? ketoconazole (NIZORAL) 2 % cream [Pharmacy Med Name: KETOCONAZOLE 2% TOP CREAM GM] 30 g   ?  Sig: APPLY 1 APPLICATION TOPICALLY ONCE DAILY  ?  ? Not Delegated - Over the Counter: OTC 2 Failed - 12/10/2021 11:13 AM  ?  ?  Failed - This refill cannot be delegated  ?  ?  Passed - Valid encounter within last 12 months  ?  Recent  Outpatient Visits   ?      ? 1 week ago DM type 2 with diabetic peripheral neuropathy ([place])  ? [place], DO  ? 2 weeks ago Callus of foot  ? [facility] [place], Coralie Keens, NP  ? 3 weeks ago Encounter for general adult medical examination with abnormal findings  ? [facility] [place], [place], NP  ? 4 months ago Blood in semen  ? [facility] [place], [place] W, NP  ? 5 months ago Abdominal bloating  ? [facility] [place], Coralie Keens, NP  ?  ?  ?Future Appointments   ?        ? In 1 week  [facility], [place]  ? In 2 months Baity, Coralie Keens, NP [facility], [place]  ?  ? ?  ?  ?  ? ? ?

## 2021-12-11 ENCOUNTER — Telehealth: Payer: Medicare Other

## 2021-12-11 ENCOUNTER — Telehealth: Payer: Self-pay | Admitting: Pharmacist

## 2021-12-11 ENCOUNTER — Other Ambulatory Visit: Payer: Self-pay

## 2021-12-11 DIAGNOSIS — J431 Panlobular emphysema: Secondary | ICD-10-CM

## 2021-12-11 MED ORDER — ALBUTEROL SULFATE HFA 108 (90 BASE) MCG/ACT IN AERS: 2.0000 | INHALATION_SPRAY | Freq: Four times a day (QID) | RESPIRATORY_TRACT | 1 refills | Status: DC | PRN

## 2021-12-11 NOTE — Telephone Encounter (Signed)
?  Chronic Care Management  ? ?Outreach Note ? ?12/11/2021 ?Name: Nathaniel Little MRN: [id number] DOB: 20-Oct-1945 ? ?Referred by: Jearld Fenton, NP ?Reason for referral : No chief complaint on file. ? ? ?Today reach patient by telephone, but he is currently driving. Will call patient back another day in the afternoon as requested ? ?Follow Up Plan: CCM Pharmacist will outreach to patient by telephone within the next 14 days. ? ?Wallace Cullens, PharmD, BCACP ?Clinical Pharmacist ?[place] [place] [facility] ?[phone] ? ?

## 2021-12-15 ENCOUNTER — Ambulatory Visit
Admission: RE | Admit: 2021-12-15 | Discharge: 2021-12-15 | Disposition: A | Discharge status: Home / Self Care | Payer: Medicare Other | Source: Ambulatory Visit | Attending: Nephrology | Admitting: Nephrology

## 2021-12-15 ENCOUNTER — Ambulatory Visit
Admission: RE | Admit: 2021-12-15 | Discharge: 2021-12-15 | Disposition: A | Discharge status: Home / Self Care | Payer: Medicare Other | Source: Ambulatory Visit | Attending: Podiatry | Admitting: Podiatry

## 2021-12-15 ENCOUNTER — Ambulatory Visit: Payer: Medicare Other

## 2021-12-15 ENCOUNTER — Other Ambulatory Visit: Payer: Self-pay

## 2021-12-15 DIAGNOSIS — E119 Type 2 diabetes mellitus without complications: Secondary | ICD-10-CM | POA: Diagnosis not present

## 2021-12-15 DIAGNOSIS — E1122 Type 2 diabetes mellitus with diabetic chronic kidney disease: Secondary | ICD-10-CM | POA: Diagnosis not present

## 2021-12-15 DIAGNOSIS — N1832 Chronic kidney disease, stage 3b: Secondary | ICD-10-CM | POA: Diagnosis not present

## 2021-12-15 DIAGNOSIS — L84 Corns and callosities: Secondary | ICD-10-CM | POA: Insufficient documentation

## 2021-12-15 DIAGNOSIS — N281 Cyst of kidney, acquired: Secondary | ICD-10-CM | POA: Diagnosis not present

## 2021-12-15 DIAGNOSIS — N200 Calculus of kidney: Secondary | ICD-10-CM | POA: Diagnosis not present

## 2021-12-15 DIAGNOSIS — M7731 Calcaneal spur, right foot: Secondary | ICD-10-CM | POA: Diagnosis not present

## 2021-12-16 ENCOUNTER — Telehealth: Payer: Self-pay | Admitting: *Deleted

## 2021-12-16 NOTE — Telephone Encounter (Signed)
"  I went yesterday and had my foot xrayed.  They said you should have the x-rays within 24 to 48 hours.  I'd like to know what is what.  Give me a call." ?

## 2021-12-17 ENCOUNTER — Other Ambulatory Visit: Payer: Self-pay

## 2021-12-17 ENCOUNTER — Ambulatory Visit (INDEPENDENT_AMBULATORY_CARE_PROVIDER_SITE_OTHER): Payer: Medicare Other

## 2021-12-17 VITALS — BP 111/45 | HR 83 | Temp 98.4°F | Resp 20 | Ht 69.0 in | Wt 209.6 lb

## 2021-12-17 DIAGNOSIS — Z0000 Encounter for general adult medical examination without abnormal findings: Secondary | ICD-10-CM

## 2021-12-17 NOTE — Progress Notes (Addendum)
? ?Subjective:  ? Nathaniel Little is a 76 y.o. male who presents for Medicare Annual/Subsequent preventive examination. ? ?Review of Systems    ? Constitutional:  Denies fever, malaise, headache or abrupt weight changes.  ?HEENT: Denies eye pain, eye redness, ear pain, ringing in the ears, wax buildup, runny nose, nasal congestion, bloody nose, or sore throat. ?Respiratory:Pt reports chronic cough, shortness of breath with exertion. Denies difficulty breathing.   ?Cardiovascular: Denies chest pain, chest tightness, palpitations or swelling in the hands or feet.  ?GU: Pt reports urinary frequency. Denies urgency, pain with urination, burning sensation, blood in urine, odor or discharge. ?Musculoskeletal: Patient reports right foot pain.  Denies decrease in range of motion, difficulty with gait, muscle pain or joint swelling.  ?Skin: Pt reports callus of right foot. Denies redness, rashes, or ulcercations.  ?Neurological: Pt reports peripheral neuropathy, difficulty with balance. Denies dizziness, difficulty with memory, difficulty with speech or problems with coordination.  ?  ? ?   ?Objective:  ?  ?Today's Vitals  ? 12/17/21 1109 12/17/21 1112  ?BP: (!) 111/45   ?Pulse: 83   ?Resp: 20   ?Temp: 98.4 ?F (36.9 ?C)   ?TempSrc: Oral   ?SpO2: 98%   ?Weight: 209 lb 9.6 oz (95.1 kg)   ?Height: _0  (1.753 m)   ?PainSc:  9   ? ?Body mass index is 30.95 kg/m?. ? ?Advanced Directives 10/24/2021 07/31/2021 06/29/2021 04/14/2021 12/12/2020 12/09/2020 11/19/2020  ?Does Patient Have a Medical Advance Directive? _1  Yes Yes  ?Type of Advance Directive - - - - - Press photographer;Living will [place];Living will  ?Does patient want to make changes to medical advance directive? - - - - - - -  ?Copy of [place] in Chart? - - - - - Yes - validated most recent copy scanned in chart (See row information) No - copy requested  ?Would patient like information on creating a medical  advance directive? - No - Patient declined No - Patient declined - - - -  ? ? ?Current Medications (verified) ?Outpatient Encounter Medications as of 12/17/2021  ?Medication Sig  ? albuterol (VENTOLIN HFA) 108 (90 Base) MCG/ACT inhaler Inhale 2 puffs into the lungs every 6 (six) hours as needed for wheezing or shortness of breath.  ? aspirin EC 81 MG tablet Take 81 mg by mouth daily.  ? atorvastatin (LIPITOR) 10 MG tablet Take 1 tablet (10 mg total) by mouth daily.  ? BREZTRI AEROSPHERE 160-9-4.8 MCG/ACT AERO INHALE 2 PUFFS INTO THE LUNGS TWICE DAILY  ? calcium-vitamin D (OSCAL WITH D) 250-125 MG-UNIT tablet Take 1 tablet by mouth daily.  ? cloNIDine (CATAPRES) 0.1 MG tablet TAKE 1 TABLET BY MOUTH TWICE DAILY  ? fluticasone (FLONASE) 50 MCG/ACT nasal spray   ? gabapentin (NEURONTIN) 100 MG capsule TAKE 1-2 CAPSULES BY MOUTH AT BEDTIME  ? glucose blood (ACCU-CHEK GUIDE) test strip Check blood sugar twice daily  ? insulin glargine (LANTUS) 100 unit/mL SOPN Inject 22 Units into the skin at bedtime.  ? ipratropium-albuterol (DUONEB) 0.5-2.5 (3) MG/3ML SOLN Take 3 mLs by nebulization every 6 (six) hours as needed (Asthma).  ? ketoconazole (NIZORAL) 2 % cream APPLY 1 APPLICATION TOPICALLY ONCE DAILY  ? lisinopril (ZESTRIL) 5 MG tablet Take 1 tablet (5 mg total) by mouth daily.  ? metFORMIN (GLUCOPHAGE) 500 MG tablet Take 1,000 mg by mouth in the morning and at bedtime.  ? pantoprazole (PROTONIX) 40 MG tablet Take  1 tablet (40 mg total) by mouth 2 (two) times daily.  ? potassium chloride (KLOR-CON) 10 MEQ tablet Take 3 tablets (30 mEq total) by mouth daily.  ? sennosides-docusate sodium (SENOKOT-S) 8.6-50 MG tablet Take 1 tablet by mouth in the morning and at bedtime.  ? torsemide (DEMADEX) 10 MG tablet Take 1 tablet (10 mg total) by mouth daily.  ? carvedilol (COREG) 6.25 MG tablet Take 1 tablet (6.25 mg total) by mouth 2 (two) times daily with a meal.  ? ezetimibe (ZETIA) 10 MG tablet Take 1 tablet (10 mg total) by mouth  daily.  ? ?No facility-administered encounter medications on file as of 12/17/2021.  ? ? ?Allergies (verified) ?Codeine  ? ?History: ?Past Medical History:  ?Diagnosis Date  ? (HFpEF) heart failure with preserved ejection fraction ([place])   ? a. 08/2017 Echo: EF 60-65%, Gr1 DD.  ? Arthritis   ? COPD (chronic obstructive pulmonary disease) ([place])   ? Coronary artery disease 2021  ? Depression   ? Diabetes mellitus without complication ([place])   ? Hypertension   ? Incidental pulmonary nodule, greater than or equal to 62m 03/11/2020  ? Left lower lobe - suspicious for bronchogenic neoplasm  ? Lymphedema   ? Morbid obesity ([place]   ? Prostate cancer ([place] 03/2013  ? ? PANCREATIC  ? 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 s/p TAVR with a 29 mm Edwards Sapien 3 THV via the TF approach by Drs MBuena Irish& ORoxy Manns ? Severe aortic stenosis   ? Sleep apnea with use of continuous positive airway pressure (CPAP)   ? Tobacco abuse   ? Tremor   ? HEAD  ? ?Past Surgical History:  ?Procedure Laterality Date  ? ADENOIDECTOMY    ? APPENDECTOMY    ? CARDIAC CATHETERIZATION    ? CATARACT EXTRACTION W/PHACO Left 11/28/2018  ? Procedure: CATARACT EXTRACTION PHACO AND INTRAOCULAR LENS PLACEMENT (IOC) LEFT, DIABETIC;  Surgeon: PBirder Robson MD;  Location: [facility];  Service: Ophthalmology;  Laterality: Left;  [place] 00:56 ?CDE 9.44 ?Fluid pack lot # [id number]  ? EUS N/A 10/13/2017  ? Procedure: FULL UPPER ENDOSCOPIC ULTRASOUND (EUS) RADIAL;  Surgeon: [name];  [facility];  Service: Gastroenterology;  Laterality: N/A;  ? FLEXIBLE BRONCHOSCOPY N/A 11/19/2020  ? Procedure: FLEXIBLE BRONCHOSCOPY;  Surgeon: HMelrose Nakayama MD;  [place]: [place]  Service: Thoracic;  Laterality: N/A;  ? INTERCOSTAL NERVE BLOCK Left 11/17/2020  ? Procedure: INTERCOSTAL NERVE BLOCK;  Surgeon: HMelrose Nakayama MD;  [place]  Service: Thoracic;  Laterality: Left;  ? NODE DISSECTION Left 11/17/2020  ?  Procedure: NODE DISSECTION;  Surgeon: HMelrose Nakayama MD;  Location: [facility]  Service: Thoracic;  Laterality: Left;  ? PANCREATICODUODENECTOMY  07/23/2018  ? RIGHT/LEFT HEART CATH AND CORONARY ANGIOGRAPHY N/A 02/21/2020  ? Procedure: RIGHT/LEFT HEART CATH AND CORONARY ANGIOGRAPHY;  Surgeon: GMinna Merritts MD;  [facility];  Service: Cardiovascular;  Laterality: N/A;  ? TEE WITHOUT CARDIOVERSION N/A 04/01/2020  ? Procedure: TRANSESOPHAGEAL ECHOCARDIOGRAM (TEE);  Surgeon: MBurnell Blanks MD;  [facility];  Service: Open Heart Surgery;  Laterality: N/A;  ? TONSILLECTOMY    ? TRANSCATHETER AORTIC VALVE REPLACEMENT, TRANSFEMORAL N/A 04/01/2020  ? Procedure: TRANSCATHETER AORTIC VALVE REPLACEMENT, TRANSFEMORAL;  Surgeon: MBurnell Blanks MD;  [facility];  Service: Open Heart Surgery;  Laterality: N/A;  ? wipple    ? ?Family History  ?Problem Relation  Age of Onset  ? Breast cancer Mother   ? Cancer Father   ?     Black Lung  ? Cerebral palsy Brother   ? Tuberculosis Paternal Uncle   ? Prostate cancer Neg Hx   ? Kidney cancer Neg Hx   ? Bladder Cancer Neg Hx   ? ?Social History  ? ?Socioeconomic History  ? Marital status: Widowed  ?  Spouse name: Not on file  ? Number of children: 2  ? Years of education: Not on file  ? Highest education level: Not on file  ?Occupational History  ? Occupation: retired Social research officer, government  ?Tobacco Use  ? Smoking status: Former  ?  Packs/day: 4.00  ?  Years: 68.00  ?  Pack years: 272.00  ?  Types: Cigarettes  ?  Quit date: 10/04/2021  ?  Years since quitting: 0.2  ? Smokeless tobacco: Former  ?  Types: Chew, Snuff  ?  Quit date: 2000  ? Tobacco comments:  ?  2-3 cigs a day   ?Vaping Use  ? Vaping Use: Never used  ?Substance and Sexual Activity  ? Alcohol use: Not Currently  ?  Alcohol/week: 0.0 - 1.0 standard drinks  ?  Comment: quit 40years ago  ? Drug use: No  ? Sexual activity: Not Currently  ?Other Topics Concern   ? Not on file  ?Social History Narrative  ? [facility]   ? ?Social Determinants of Health  ? ?Financial Resource Strain: Low Risk   ? Difficulty of Paying Living Expenses: Not hard at all  ?Food

## 2021-12-18 ENCOUNTER — Telehealth: Payer: Self-pay

## 2021-12-18 NOTE — Telephone Encounter (Signed)
Patient called and was wanting to know the results of his xray done on 12/15/21 ordered by you.  Please advise so I can call the patient   Thank you! ?

## 2021-12-21 ENCOUNTER — Telehealth: Payer: Self-pay | Admitting: *Deleted

## 2021-12-21 ENCOUNTER — Telehealth: Payer: Medicare Other

## 2021-12-21 ENCOUNTER — Telehealth: Payer: Self-pay | Admitting: Pharmacist

## 2021-12-21 DIAGNOSIS — J449 Chronic obstructive pulmonary disease, unspecified: Secondary | ICD-10-CM | POA: Diagnosis not present

## 2021-12-21 DIAGNOSIS — I5030 Unspecified diastolic (congestive) heart failure: Secondary | ICD-10-CM | POA: Diagnosis not present

## 2021-12-21 NOTE — Telephone Encounter (Signed)
?  Chronic Care Management  ? ?Outreach Note ? ?12/21/2021 ?Name: Nathaniel Little MRN: [id number] DOB: [birth year] ? ?Referred by: Jearld Fenton, NP ?Reason for referral : No chief complaint on file. ? ? ?Was unable to reach patient via telephone today and unable to leave a message as voicemail box is full ? ? ?Follow Up Plan: CCM Pharmacist will outreach to patient by telephone within the next 30 days. ? ?Wallace Cullens, PharmD, BCACP ?Clinical Pharmacist ?[facility] ?[phone] ? ?

## 2021-12-22 ENCOUNTER — Telehealth: Payer: Self-pay | Admitting: Podiatry

## 2021-12-22 NOTE — Telephone Encounter (Signed)
Attempted to call pt twice to schedule appt with Dr. Prudence Davidson to review x-rays. I was unable to leave a voice message.  ?

## 2021-12-23 ENCOUNTER — Ambulatory Visit: Payer: Medicare Other

## 2021-12-23 DIAGNOSIS — I5030 Unspecified diastolic (congestive) heart failure: Secondary | ICD-10-CM | POA: Diagnosis not present

## 2021-12-23 DIAGNOSIS — J449 Chronic obstructive pulmonary disease, unspecified: Secondary | ICD-10-CM | POA: Diagnosis not present

## 2021-12-24 ENCOUNTER — Telehealth: Payer: Self-pay

## 2021-12-24 NOTE — Telephone Encounter (Signed)
Tried calling; pt's voicemail box is full.  PEC please advise pt if he calls back.  ? ?Thanks,  ? ?-Mickel Baas  ?

## 2021-12-24 NOTE — Telephone Encounter (Signed)
Copied from [place] [phone]. Topic: General - Other ?>> Dec 24, 2021  9:45 AM Valere Dross wrote: ?Reason for CRM: Pts called in wanting to get his imaging results on his foot, please advise. ?

## 2021-12-24 NOTE — Telephone Encounter (Signed)
This was ordered by Dr. Prudence Davidson. He needs to call the podiatry office ?

## 2021-12-28 ENCOUNTER — Telehealth: Payer: Medicare Other

## 2021-12-28 ENCOUNTER — Telehealth: Payer: Self-pay

## 2021-12-28 NOTE — Telephone Encounter (Signed)
?  Care Management  ? ?Follow Up Note ? ? ?12/28/2021 ?Name: Nathaniel Little MRN: [id number] DOB: 1946-07-18 ? ? ?Referred by: Jearld Fenton, NP ?Reason for referral : Chronic Care Management (RNCM: Follow up for Chronic Disease Management and Care Coordination Needs ) ? ? ?An unsuccessful telephone outreach was attempted today. The patient was referred to the case management team for assistance with care management and care coordination.  ? ?Follow Up Plan: The care management team will reach out to the patient again over the next 30 days.  ? ?Noreene Larsson RN, MSN, CCM ?Community Care Coordinator ?[facility] ?[facility] ?Mobile: [phone]  ?

## 2021-12-29 ENCOUNTER — Inpatient Hospital Stay: Payer: Medicare Other | Attending: Oncology

## 2021-12-29 ENCOUNTER — Other Ambulatory Visit: Payer: Self-pay

## 2021-12-29 ENCOUNTER — Encounter: Payer: Self-pay | Admitting: Oncology

## 2021-12-29 ENCOUNTER — Inpatient Hospital Stay (HOSPITAL_BASED_OUTPATIENT_CLINIC_OR_DEPARTMENT_OTHER): Payer: Medicare Other | Admitting: Oncology

## 2021-12-29 VITALS — BP 139/77 | HR 75 | Temp 97.5°F | Resp 18 | Wt 206.2 lb

## 2021-12-29 DIAGNOSIS — Z85118 Personal history of other malignant neoplasm of bronchus and lung: Secondary | ICD-10-CM

## 2021-12-29 DIAGNOSIS — C3432 Malignant neoplasm of lower lobe, left bronchus or lung: Secondary | ICD-10-CM

## 2021-12-29 DIAGNOSIS — Z08 Encounter for follow-up examination after completed treatment for malignant neoplasm: Secondary | ICD-10-CM | POA: Diagnosis not present

## 2021-12-29 LAB — CBC WITH DIFFERENTIAL/PLATELET
Abs Immature Granulocytes: 0.07 10*3/uL (ref 0.00–0.07)
Basophils Absolute: 0 10*3/uL (ref 0.0–0.1)
Basophils Relative: 0 %
Eosinophils Absolute: 0.2 10*3/uL (ref 0.0–0.5)
Eosinophils Relative: 2 %
HCT: 38.7 % — ABNORMAL LOW (ref 39.0–52.0)
Hemoglobin: 13.2 g/dL (ref 13.0–17.0)
Immature Granulocytes: 1 %
Lymphocytes Relative: 15 %
Lymphs Abs: 1.4 10*3/uL (ref 0.7–4.0)
MCH: 30 pg (ref 26.0–34.0)
MCHC: 34.1 g/dL (ref 30.0–36.0)
MCV: 88 fL (ref 80.0–100.0)
Monocytes Absolute: 0.5 10*3/uL (ref 0.1–1.0)
Monocytes Relative: 5 %
Neutro Abs: 7.7 10*3/uL (ref 1.7–7.7)
Neutrophils Relative %: 77 %
Platelets: 190 10*3/uL (ref 150–400)
RBC: 4.4 MIL/uL (ref 4.22–5.81)
RDW: 13.4 % (ref 11.5–15.5)
WBC: 9.9 10*3/uL (ref 4.0–10.5)
nRBC: 0 % (ref 0.0–0.2)

## 2021-12-29 LAB — COMPREHENSIVE METABOLIC PANEL
ALT: 36 U/L (ref 0–44)
AST: 34 U/L (ref 15–41)
Albumin: 3.9 g/dL (ref 3.5–5.0)
Alkaline Phosphatase: 108 U/L (ref 38–126)
Anion gap: 10 (ref 5–15)
BUN: 22 mg/dL (ref 8–23)
CO2: 25 mmol/L (ref 22–32)
Calcium: 9.3 mg/dL (ref 8.9–10.3)
Chloride: 98 mmol/L (ref 98–111)
Creatinine, Ser: 1.38 mg/dL — ABNORMAL HIGH (ref 0.61–1.24)
GFR, Estimated: 53 mL/min — ABNORMAL LOW (ref >60)
Glucose, Bld: 319 mg/dL — ABNORMAL HIGH (ref 70–99)
Potassium: 4.3 mmol/L (ref 3.5–5.1)
Sodium: 133 mmol/L — ABNORMAL LOW (ref 135–145)
Total Bilirubin: 0.5 mg/dL (ref 0.3–1.2)
Total Protein: 7.5 g/dL (ref 6.5–8.1)

## 2021-12-29 NOTE — Progress Notes (Signed)
? ? ? ?Hematology/Oncology Consult note ?[place]  ?[phone] [phone] ? ?Patient Care Team: ?Jearld Fenton, NP as PCP - General (Internal Medicine) ?Minna Merritts, MD as PCP - Cardiology (Cardiology) ?Minna Merritts, MD as Consulting Physician (Cardiology) ?Vanita Ingles, RN as Case Manager (General Practice) ?Delles, Virl Diamond, RPH-CPP as Pharmacist ?Arvil Chaco, PA-C (Inactive) as Physician Assistant (Cardiology)  ? ?Name of the patient: Nathaniel Little  ?[id number]  ?1946-07-03  ? ?Date of visit: 12/29/21 ? ?Diagnosis- 1.  History of IPMN s/p Whipple's ?2.  Stage Ia adenocarcinoma of the left lower lobe s/p resection ? ?Chief complaint/ Reason for visit- routine follow-up of lung cancer ? ?Heme/Onc history: Patient is a 76 year old male who was last seen by me in 2018 for cystic mass in the pancreas which was ultimately identified as IPMN for which she underwent Whipple surgery at [facility].  Recently again he was admitted to the hospital for symptoms of pancreatitis following ERCP for choledocholithiasis.  Patient had undergone CT angio chest on 05/25/2020 for symptoms of left upper quadrant abdominal pain which incidentally showed a solid 1.5 cm left lower lobe pulmonary nodule which had increased from 1.4 cm on prior CT in June 2021.  He has also undergone a PET scan in June 2021 which showed mild hypermetabolism in this area concerning for primary bronchogenic neoplasm.  No locoregional adenopathy or distant metastatic disease. ?  ?Patient noted to have a 1.5 cm left lower lobe lung nodule for which she underwent definitive surgery left lower lobe lobectomy done on 11/17/2020 showed invasive mucinous adenocarcinoma 1.4 cm.  Margins negative.  No visceral pleural invasion.  0 out of 18 lymph nodes positive for malignancy. ?  ? ?Interval history-no recent hospitalizations.  He has chronic fatigue and exertional shortness of breath.  He is on home  oxygen.  He reports occasional urinary incontinence. ? ?ECOG PS- 2 ?Pain scale- 0 ?Opioid associated constipation- no ? ?Review of systems- Review of Systems  ?Constitutional:  Positive for malaise/fatigue. Negative for chills, fever and weight loss.  ?HENT:  Negative for congestion, ear discharge and nosebleeds.   ?Eyes:  Negative for blurred vision.  ?Respiratory:  Positive for shortness of breath. Negative for cough, hemoptysis, sputum production and wheezing.   ?Cardiovascular:  Negative for chest pain, palpitations, orthopnea and claudication.  ?Gastrointestinal:  Negative for abdominal pain, blood in stool, constipation, diarrhea, heartburn, melena, nausea and vomiting.  ?Genitourinary:  Negative for dysuria, flank pain, frequency, hematuria and urgency.  ?Musculoskeletal:  Negative for back pain, joint pain and myalgias.  ?Skin:  Negative for rash.  ?Neurological:  Negative for dizziness, tingling, focal weakness, seizures, weakness and headaches.  ?Endo/Heme/Allergies:  Does not bruise/bleed easily.  ?Psychiatric/Behavioral:  Negative for depression and suicidal ideas. The patient does not have insomnia.    ? ? ? ?Allergies  ?Allergen Reactions  ? Codeine Itching  ? ? ? ?Past Medical History:  ?Diagnosis Date  ? (HFpEF) heart failure with preserved ejection fraction ([place])   ? a. 08/2017 Echo: EF 60-65%, Gr1 DD.  ? Arthritis   ? COPD (chronic obstructive pulmonary disease) ([place])   ? Coronary artery disease 2021  ? Depression   ? Diabetes mellitus without complication ([place])   ? Hypertension   ? Incidental pulmonary nodule, greater than or equal to 84m 03/11/2020  ? Left lower lobe - suspicious for bronchogenic neoplasm  ? Lymphedema   ? Morbid obesity ([place]   ? Prostate cancer ([place]  03/2013  ? ? PANCREATIC  ? 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 s/p TAVR with a 29 mm Edwards Sapien 3 THV via the TF approach by Drs Buena Irish & Roxy Manns  ? Severe aortic stenosis   ? Sleep apnea with use of  continuous positive airway pressure (CPAP)   ? Tobacco abuse   ? Tremor   ? HEAD  ? ? ? ?Past Surgical History:  ?Procedure Laterality Date  ? ADENOIDECTOMY    ? APPENDECTOMY    ? CARDIAC CATHETERIZATION    ? CATARACT EXTRACTION W/PHACO Left 11/28/2018  ? Procedure: CATARACT EXTRACTION PHACO AND INTRAOCULAR LENS PLACEMENT (IOC) LEFT, DIABETIC;  Surgeon: Birder Robson, MD;  Location: [facility] ORS;  Service: Ophthalmology;  Laterality: Left;  [name]  00:56 ?CDE 9.44 ?Fluid pack lot # [id number]  ? EUS N/A 10/13/2017  ? Procedure: FULL UPPER ENDOSCOPIC ULTRASOUND (EUS) RADIAL;  Surgeon: Holly Bodily, MD;  Location: [facility] ENDOSCOPY;  Service: Gastroenterology;  Laterality: N/A;  ? FLEXIBLE BRONCHOSCOPY N/A 11/19/2020  ? Procedure: FLEXIBLE BRONCHOSCOPY;  Surgeon: Melrose Nakayama, MD;  Location: [place];  Service: Thoracic;  Laterality: N/A;  ? INTERCOSTAL NERVE BLOCK Left 11/17/2020  ? Procedure: INTERCOSTAL NERVE BLOCK;  Surgeon: Melrose Nakayama, MD;  Location: [place];  Service: Thoracic;  Laterality: Left;  ? NODE DISSECTION Left 11/17/2020  ? Procedure: NODE DISSECTION;  Surgeon: Melrose Nakayama, MD;  Location: [place];  Service: Thoracic;  Laterality: Left;  ? PANCREATICODUODENECTOMY  07/23/2018  ? RIGHT/LEFT HEART CATH AND CORONARY ANGIOGRAPHY N/A 02/21/2020  ? Procedure: RIGHT/LEFT HEART CATH AND CORONARY ANGIOGRAPHY;  Surgeon: Minna Merritts, MD;  Location: [place] CV LAB;  Service: Cardiovascular;  Laterality: N/A;  ? TEE WITHOUT CARDIOVERSION N/A 04/01/2020  ? Procedure: TRANSESOPHAGEAL ECHOCARDIOGRAM (TEE);  Surgeon: Burnell Blanks, MD;  Location: [place] CV LAB;  Service: Open Heart Surgery;  Laterality: N/A;  ? TONSILLECTOMY    ? TRANSCATHETER AORTIC VALVE REPLACEMENT, TRANSFEMORAL N/A 04/01/2020  ? Procedure: TRANSCATHETER AORTIC VALVE REPLACEMENT, TRANSFEMORAL;  Surgeon: Burnell Blanks, MD;  Location: [place] CV LAB;  Service: Open Heart Surgery;  Laterality: N/A;   ? wipple    ? ? ?Social History  ? ?Socioeconomic History  ? Marital status: Widowed  ?  Spouse name: Not on file  ? Number of children: 2  ? Years of education: Not on file  ? Highest education level: Not on file  ?Occupational History  ? Occupation: retired Social research officer, government  ?Tobacco Use  ? Smoking status: Former  ?  Packs/day: 4.00  ?  Years: 68.00  ?  Pack years: 272.00  ?  Types: Cigarettes  ?  Quit date: 10/04/2021  ?  Years since quitting: 0.2  ? Smokeless tobacco: Former  ?  Types: Chew, Snuff  ?  Quit date: 2000  ? Tobacco comments:  ?  2-3 cigs a day   ?Vaping Use  ? Vaping Use: Never used  ?Substance and Sexual Activity  ? Alcohol use: Not Currently  ?  Alcohol/week: 0.0 - 1.0 standard drinks  ?  Comment: quit 40years ago  ? Drug use: No  ? Sexual activity: Not Currently  ?Other Topics Concern  ? Not on file  ?Social History Narrative  ? Living at [facility]   ? ?Social Determinants of Health  ? ?Financial Resource Strain: Low Risk   ? Difficulty of Paying Living Expenses: Not hard at all  ?Food Insecurity: No Food Insecurity  ?  Worried About Charity fundraiser in the Last Year: Never true  ? Ran Out of Food in the Last Year: Never true  ?Transportation Needs: No Transportation Needs  ? Lack of Transportation (Medical): No  ? Lack of Transportation (Non-Medical): No  ?Physical Activity: Inactive  ? Days of Exercise per Week: 0 days  ? Minutes of Exercise per Session: 0 min  ?Stress: Stress Concern Present  ? Feeling of Stress : To some extent  ?Social Connections: Moderately Isolated  ? Frequency of Communication with Friends and Family: More than three times a week  ? Frequency of Social Gatherings with Friends and Family: More than three times a week  ? Attends Religious Services: More than 4 times per year  ? Active Member of Clubs or Organizations: No  ? Attends Archivist Meetings: Never  ? Marital Status: Widowed  ?Intimate Partner Violence: Not At Risk  ? Fear of Current or  Ex-Partner: No  ? Emotionally Abused: No  ? Physically Abused: No  ? Sexually Abused: No  ? ? ?Family History  ?Problem Relation Age of Onset  ? Breast cancer Mother   ? Cancer Father   ?     Black Lung  ? Cerebr

## 2021-12-29 NOTE — Progress Notes (Signed)
Pt c/o rt foot pain that started about 2 weeks ago states he had a xray done but has not hear back from the provider. Averaging 3 hours of sleep a night.  ?

## 2021-12-31 ENCOUNTER — Telehealth: Payer: Self-pay

## 2021-12-31 NOTE — Telephone Encounter (Signed)
?  Care Management  ? ?Follow Up Note ? ? ?12/31/2021 ?Name: Nathaniel Little MRN: [id number] DOB: 12/15/1945 ? ? ?Referred by: Jearld Fenton, NP ?Reason for referral : Chronic Care Management (RNCM: Follow up for Chronic Disease Management and Care Coordination Needs ) ? ? ?An unsuccessful telephone outreach was attempted today. The patient was referred to the case management team for assistance with care management and care coordination. The patient actually answered the phone but could not talk because he was driving. Will call on next scheduled outreach.  ? ?Follow Up Plan: Telephone follow up appointment with care management team member scheduled for: 01-15-2022 at 230 pm ? ?Noreene Larsson RN, MSN, CCM ?Community Care Coordinator ?[place] [facility] ?[facility] ?Mobile: [phone]  ?

## 2022-01-04 NOTE — Telephone Encounter (Signed)
Pt called back and was advised he needed to call the podiatry office. ?

## 2022-01-06 ENCOUNTER — Other Ambulatory Visit: Payer: Self-pay | Admitting: *Deleted

## 2022-01-06 DIAGNOSIS — C3432 Malignant neoplasm of lower lobe, left bronchus or lung: Secondary | ICD-10-CM

## 2022-01-06 DIAGNOSIS — D490 Neoplasm of unspecified behavior of digestive system: Secondary | ICD-10-CM

## 2022-01-07 ENCOUNTER — Ambulatory Visit
Admission: RE | Admit: 2022-01-07 | Discharge: 2022-01-07 | Disposition: A | Discharge status: Home / Self Care | Payer: Medicare Other | Source: Ambulatory Visit | Attending: Oncology | Admitting: Oncology

## 2022-01-07 ENCOUNTER — Telehealth: Payer: Self-pay | Admitting: *Deleted

## 2022-01-07 DIAGNOSIS — C3432 Malignant neoplasm of lower lobe, left bronchus or lung: Secondary | ICD-10-CM | POA: Diagnosis not present

## 2022-01-07 DIAGNOSIS — J439 Emphysema, unspecified: Secondary | ICD-10-CM | POA: Diagnosis not present

## 2022-01-07 DIAGNOSIS — C3490 Malignant neoplasm of unspecified part of unspecified bronchus or lung: Secondary | ICD-10-CM | POA: Diagnosis not present

## 2022-01-07 NOTE — Telephone Encounter (Signed)
I attempted to call the patient with no success.  His voicemail was full, so I couldn't leave a message.  Dr. Prudence Davidson wants him to schedule an appointment to see one of the other providers here regarding his foot pain.  I attempted to call his daughter as well with no success. ?

## 2022-01-08 ENCOUNTER — Encounter: Payer: Self-pay | Admitting: Cardiovascular Disease

## 2022-01-08 ENCOUNTER — Other Ambulatory Visit: Payer: Self-pay | Admitting: *Deleted

## 2022-01-08 ENCOUNTER — Ambulatory Visit (INDEPENDENT_AMBULATORY_CARE_PROVIDER_SITE_OTHER): Payer: Medicare Other | Admitting: Cardiovascular Disease

## 2022-01-08 ENCOUNTER — Other Ambulatory Visit: Payer: Self-pay

## 2022-01-08 VITALS — BP 136/68 | HR 77 | Ht 69.0 in | Wt 204.2 lb

## 2022-01-08 DIAGNOSIS — I10 Essential (primary) hypertension: Secondary | ICD-10-CM | POA: Diagnosis not present

## 2022-01-08 DIAGNOSIS — I700 Atherosclerosis of aorta: Secondary | ICD-10-CM | POA: Diagnosis not present

## 2022-01-08 DIAGNOSIS — D490 Neoplasm of unspecified behavior of digestive system: Secondary | ICD-10-CM

## 2022-01-08 DIAGNOSIS — I350 Nonrheumatic aortic (valve) stenosis: Secondary | ICD-10-CM | POA: Diagnosis not present

## 2022-01-08 DIAGNOSIS — E1142 Type 2 diabetes mellitus with diabetic polyneuropathy: Secondary | ICD-10-CM

## 2022-01-08 MED ORDER — ATORVASTATIN CALCIUM 10 MG PO TABS: 10.0000 mg | ORAL_TABLET | Freq: Every day | ORAL | 2 refills | Status: DC

## 2022-01-08 MED ORDER — CARVEDILOL 6.25 MG PO TABS: 6.2500 mg | ORAL_TABLET | Freq: Two times a day (BID) | ORAL | 3 refills | Status: AC

## 2022-01-08 MED ORDER — TORSEMIDE 10 MG PO TABS: 10.0000 mg | ORAL_TABLET | Freq: Every day | ORAL | 6 refills | Status: DC

## 2022-01-08 MED ORDER — EZETIMIBE 10 MG PO TABS: 10.0000 mg | ORAL_TABLET | Freq: Every day | ORAL | 3 refills | Status: AC

## 2022-01-08 MED ORDER — CLONIDINE HCL 0.1 MG PO TABS: 0.1000 mg | ORAL_TABLET | Freq: Two times a day (BID) | ORAL | 3 refills | Status: DC

## 2022-01-08 MED ORDER — LISINOPRIL 5 MG PO TABS: 5.0000 mg | ORAL_TABLET | Freq: Every day | ORAL | 3 refills | Status: AC

## 2022-01-08 NOTE — Progress Notes (Signed)
?  ?  ? ? ? ?Date:  01/08/2022  ? ?ID:  Nathaniel Little, DOB [birth year], MRN [id number] ? ?Patient Location:  ?[place] ?[place] [id number]  ? ?Provider location:   ?[place], [employer] office ? ?PCP:  Jearld Fenton, NP  ?Cardiologist:  [facility] ? ?Chief Complaint  ?Patient presents with  ? 2 week follow up   ?  Patient c/o shortness of breath with exertion. Medications reviewed by the patient verbally.   ? ? ?History of Present Illness:   ? ?Nathaniel Little is a 76 y.o. male past medical history of ?Smoker, COPD ?Prior significant alcohol intake.  ?OSA, on CPAP ?HTN ?Murmur ?Prostate cancer ?Diabetes: ?07/25/2017 emergency department via EMS with 1 day of progressive worsening shortness of breath.  ? increasing productive cough and shortness of breath  ?Constipated ?CT ABD: Pancreatic findings :chronic pancreatitis and calcifications, possible pancreatic mass ?Moderate aortic atherosclerosis noted extending into the common iliac vessels ?Who presents for follow up of his aortic valve stenosis, TAVR April 01, 2020 ?  ?Last seen by myself on telemetry visit April 2020 ?Seen by one of our providers October 2022 ? ?Echo May 2021  ?Severe aortic valve stenosis with normal ejection fraction ?Cardiac catheterization May 2021 showing nonobstructive disease ?Mild carotid disease ? ?CTA performed showing suspicion for bronchogenic carcinoma ?PET scan June 2021 concerning for primary bronchogenic neoplasm ? ?Underwent TAVR April 01, 2020 ? ?L lower lobectomy on 11/17/20. ? ?Quit smoking ? ?Having right foot pain,lesion on bottom of foot ? ?Incontinence , "no control", going on for 3 to 4 months, peeing on myself ? ?Presents today in wheelchair ?Difficulty walking, has a sore on the bottom of his right foot ? ?CT chest, COPD ? On oxygen at night ? ?Has thick cough ?Has 2 inhaler one in house, one in the car ?Denies chest pain concerning for angina ? ?EKG personally reviewed by myself on todays  visit ?Normal sinus rhythm rate 77 bpm consider old anterior MI, old inferior MI ? ? ?Past Medical History:  ?Diagnosis Date  ? (HFpEF) heart failure with preserved ejection fraction ([place])   ? a. 08/2017 Echo: EF 60-65%, Gr1 DD.  ? Arthritis   ? COPD (chronic obstructive pulmonary disease) ([place])   ? Coronary artery disease 2021  ? Depression   ? Diabetes mellitus without complication ([place])   ? Hypertension   ? Incidental pulmonary nodule, greater than or equal to 62m 03/11/2020  ? Left lower lobe - suspicious for bronchogenic neoplasm  ? Lymphedema   ? Morbid obesity ([place]   ? [place] 03[place]  ? ? PANCREATIC  ? 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 s/p TAVR with a 29 mm Edwards Sapien 3 THV via the TF approach by Drs MBuena Irish& ORoxy Manns ? Severe aortic stenosis   ? Sleep apnea with use of continuous positive airway pressure (CPAP)   ? Tobacco abuse   ? Tremor   ? HEAD  ? ?Past Surgical History:  ?Procedure Laterality Date  ? ADENOIDECTOMY    ? APPENDECTOMY    ? CARDIAC CATHETERIZATION    ? CATARACT EXTRACTION W/PHACO Left 11/28/2018  ? Procedure: CATARACT EXTRACTION PHACO AND INTRAOCULAR LENS PLACEMENT (IOC) LEFT, DIABETIC;  Surgeon: PBirder Robson MD;  Location: [facility] ORS;  Service: Ophthalmology;  Laterality: Left;  [place] 00:56 ?CDE 9.44 ?Fluid pack lot # [id number]  ? EUS N/A 10/13/2017  ? Procedure: FULL UPPER ENDOSCOPIC ULTRASOUND (EUS) RADIAL;  Surgeon: Holly Bodily, MD;  [facility] END[facility];  Service: Gastroenterology;  Laterality: N/A;  ? FLEXIBLE BRONCHOSCOPY N/A 11/19/2020  ? Procedure: FLEXIBLE BRONCHOSCOPY;  Surgeon: Melrose Nakayama, MD;  Location: [place];  Service: Thoracic;  Laterality: N/A;  ? INTERCOSTAL NERVE BLOCK Left 11/17/2020  ? Procedure: INTERCOSTAL NERVE BLOCK;  Surgeon: Melrose Nakayama, MD;  Location: [place];  Service: Thoracic;  Laterality: Left;  ? NODE DISSECTION Left 11/17/2020  ? Procedure: NODE DISSECTION;  Surgeon: Melrose Nakayama, MD;  Location: [place];  Service: Thoracic;  Laterality: Left;  ? PANCREATICODUODENECTOMY  07/23/2018  ? RIGHT/LEFT HEART CATH AND CORONARY ANGIOGRAPHY N/A 02/21/2020  ? Procedure: RIGHT/LEFT HEART CATH AND CORONARY ANGIOGRAPHY;  Surgeon: Minna Merritts, MD;  Location: [place] CV LAB;  Service: Cardiovascular;  Laterality: N/A;  ? TEE WITHOUT CARDIOVERSION N/A 04/01/2020  ? Procedure: TRANSESOPHAGEAL ECHOCARDIOGRAM (TEE);  Surgeon: Burnell Blanks, MD;  Location: [place] CV LAB;  Service: Open Heart Surgery;  Laterality: N/A;  ? TONSILLECTOMY    ? TRANSCATHETER AORTIC VALVE REPLACEMENT, TRANSFEMORAL N/A 04/01/2020  ? Procedure: TRANSCATHETER AORTIC VALVE REPLACEMENT, TRANSFEMORAL;  Surgeon: Burnell Blanks, MD;  Location: [place] CV LAB;  Service: Open Heart Surgery;  Laterality: N/A;  ? wipple    ?  ? ?Current Meds  ?Medication Sig  ? albuterol (VENTOLIN HFA) 108 (90 Base) MCG/ACT inhaler Inhale 2 puffs into the lungs every 6 (six) hours as needed for wheezing or shortness of breath.  ? aspirin EC 81 MG tablet Take 81 mg by mouth daily.  ? atorvastatin (LIPITOR) 10 MG tablet Take 1 tablet (10 mg total) by mouth daily.  ? BREZTRI AEROSPHERE 160-9-4.8 MCG/ACT AERO INHALE 2 PUFFS INTO THE LUNGS TWICE DAILY  ? calcium-vitamin D (OSCAL WITH D) 250-125 MG-UNIT tablet Take 1 tablet by mouth daily.  ? carvedilol (COREG) 6.25 MG tablet Take 1 tablet (6.25 mg total) by mouth 2 (two) times daily with a meal.  ? cloNIDine (CATAPRES) 0.1 MG tablet TAKE 1 TABLET BY MOUTH TWICE DAILY  ? ezetimibe (ZETIA) 10 MG tablet Take 1 tablet (10 mg total) by mouth daily.  ? fluticasone (FLONASE) 50 MCG/ACT nasal spray   ? gabapentin (NEURONTIN) 100 MG capsule TAKE 1-2 CAPSULES BY MOUTH AT BEDTIME  ? glucose blood (ACCU-CHEK GUIDE) test strip Check blood sugar twice daily  ? insulin glargine (LANTUS) 100 unit/mL SOPN Inject 22 Units into the skin at bedtime.  ? ipratropium-albuterol (DUONEB) 0.5-2.5 (3)  MG/3ML SOLN Take 3 mLs by nebulization every 6 (six) hours as needed (Asthma).  ? ketoconazole (NIZORAL) 2 % cream APPLY 1 APPLICATION TOPICALLY ONCE DAILY  ? lisinopril (ZESTRIL) 5 MG tablet Take 1 tablet (5 mg total) by mouth daily.  ? metFORMIN (GLUCOPHAGE) 500 MG tablet Take 1,000 mg by mouth in the morning and at bedtime.  ? pantoprazole (PROTONIX) 40 MG tablet Take 1 tablet (40 mg total) by mouth 2 (two) times daily.  ? sennosides-docusate sodium (SENOKOT-S) 8.6-50 MG tablet Take 1 tablet by mouth in the morning and at bedtime.  ? torsemide (DEMADEX) 10 MG tablet Take 1 tablet (10 mg total) by mouth daily.  ?  ? ?Allergies:   Codeine  ? ?Social History  ? ?Tobacco Use  ? Smoking status: Former  ?  Packs/day: 4.00  ?  Years: 68.00  ?  Pack years: 272.00  ?  Types: Cigarettes  ?  Quit date: 10/04/2021  ?  Years since quitting: 0.2  ?  Smokeless tobacco: Former  ?  Types: Chew, Snuff  ?  Quit date: 2000  ? Tobacco comments:  ?  1 cigs a day   ?Vaping Use  ? Vaping Use: Never used  ?Substance Use Topics  ? Alcohol use: Not Currently  ?  Alcohol/week: 0.0 - 1.0 standard drinks  ?  Comment: quit 40years ago  ? Drug use: No  ?  ? ?Current Outpatient Medications on File Prior to Visit  ?Medication Sig Dispense Refill  ? albuterol (VENTOLIN HFA) 108 (90 Base) MCG/ACT inhaler Inhale 2 puffs into the lungs every 6 (six) hours as needed for wheezing or shortness of breath. 54 g 1  ? aspirin EC 81 MG tablet Take 81 mg by mouth daily.    ? atorvastatin (LIPITOR) 10 MG tablet Take 1 tablet (10 mg total) by mouth daily. 30 tablet 2  ? BREZTRI AEROSPHERE 160-9-4.8 MCG/ACT AERO INHALE 2 PUFFS INTO THE LUNGS TWICE DAILY 10.7 g 0  ? calcium-vitamin D (OSCAL WITH D) 250-125 MG-UNIT tablet Take 1 tablet by mouth daily.    ? carvedilol (COREG) 6.25 MG tablet Take 1 tablet (6.25 mg total) by mouth 2 (two) times daily with a meal. 180 tablet 3  ? cloNIDine (CATAPRES) 0.1 MG tablet TAKE 1 TABLET BY MOUTH TWICE DAILY 60 tablet 1  ?  ezetimibe (ZETIA) 10 MG tablet Take 1 tablet (10 mg total) by mouth daily. 90 tablet 3  ? fluticasone (FLONASE) 50 MCG/ACT nasal spray     ? gabapentin (NEURONTIN) 100 MG capsule TAKE 1-2 CAPSULES BY MOUTH AT

## 2022-01-08 NOTE — Progress Notes (Signed)
Pt needed orders and me and another person working on it did not know that both of [name] doing it at same ?

## 2022-01-08 NOTE — Patient Instructions (Addendum)
Medication Instructions:  No changes  If you need a refill on your cardiac medications before your next appointment, please call your pharmacy.   Lab work: No new labs needed  Testing/Procedures: No new testing needed  Follow-Up: At [facility], you and your health needs are our priority.  As part of our continuing mission to provide you with exceptional heart care, we have created designated Provider Care Teams.  These Care Teams include your primary Cardiologist (physician) and Advanced Practice Providers (APPs -  Physician Assistants and Nurse Practitioners) who all work together to provide you with the care you need, when you need it.  You will need a follow up appointment in 12 months  Providers on your designated Care Team:   Christopher Berge, NP Ryan Dunn, PA-C Cadence Furth, PA-C  COVID-19 Vaccine Information can be found at: [email or web address] For questions related to vaccine distribution or appointments, please email [email or web address] or call [phone].   

## 2022-01-11 ENCOUNTER — Telehealth: Payer: Self-pay | Admitting: Pharmacist

## 2022-01-11 ENCOUNTER — Telehealth: Payer: Medicare Other

## 2022-01-11 NOTE — Telephone Encounter (Signed)
?  Chronic Care Management  ? ?Outreach Note ? ?01/11/2022 ?Name: Nathaniel Little MRN: [id number] DOB: May 11, 1946 ? ?Referred by: Jearld Fenton, NP ?Reason for referral : No chief complaint on file. ? ? ?Was unable to reach patient via telephone today and unable to leave a message as voicemail box is full. Have unsuccessfully attempted to reach patient by telephone x3. ? ? ?Follow Up Plan: CCM Pharmacist will collaborate with CCM team for assistance with reaching patient ? ?Wallace Cullens, PharmD, BCACP ?Clinical Pharmacist ?[place] [facility] ?[phone] ? ?

## 2022-01-13 DIAGNOSIS — I10 Essential (primary) hypertension: Secondary | ICD-10-CM | POA: Diagnosis not present

## 2022-01-13 DIAGNOSIS — N1832 Chronic kidney disease, stage 3b: Secondary | ICD-10-CM | POA: Diagnosis not present

## 2022-01-13 DIAGNOSIS — E1122 Type 2 diabetes mellitus with diabetic chronic kidney disease: Secondary | ICD-10-CM | POA: Diagnosis not present

## 2022-01-14 ENCOUNTER — Telehealth: Payer: Self-pay

## 2022-01-14 ENCOUNTER — Telehealth: Payer: Medicare Other

## 2022-01-14 NOTE — Telephone Encounter (Signed)
He needs to  go to the ER for hyperglycemia ?

## 2022-01-14 NOTE — Telephone Encounter (Signed)
Left message advising Nathaniel Little.  I tried calling [name] but his voicemail is full.  PEC please advise pt or Nathaniel Little when they call back.  ?

## 2022-01-14 NOTE — Telephone Encounter (Signed)
?  Care Management  ? ?Follow Up Note ? ? ?01/14/2022 ?Name: Nathaniel Little MRN: [id number] DOB: 03-21-1946 ? ? ?Referred by: Jearld Fenton, NP ?Reason for referral : Chronic Care Management (RNCM: Follow up for Chronic Disease Management and Care Coordination  Needs) ? ? ?A second unsuccessful telephone outreach was attempted today. The patient was referred to the case management team for assistance with care management and care coordination.  ? ?Follow Up Plan: The care management team will reach out to the patient again over the next 30 days.  ? ?Noreene Larsson RN, MSN, CCM ?Community Care Coordinator ?[place] [facility] ?[facility] ?Mobile: [phone]  ?

## 2022-01-14 NOTE — Telephone Encounter (Signed)
Copied from [place] [phone]. Topic: General - Other ?>> Jan 14, 2022  3:56 PM Yvette Rack wrote: ?Reason for CRM: Anderson Malta with Nephrology (Dr. Elwyn Lade office) reports that patient glucose reading was 609. Cb# [phone] ?

## 2022-01-19 DIAGNOSIS — I10 Essential (primary) hypertension: Secondary | ICD-10-CM | POA: Diagnosis not present

## 2022-01-19 DIAGNOSIS — N1831 Chronic kidney disease, stage 3a: Secondary | ICD-10-CM | POA: Diagnosis not present

## 2022-01-19 DIAGNOSIS — E1122 Type 2 diabetes mellitus with diabetic chronic kidney disease: Secondary | ICD-10-CM | POA: Diagnosis not present

## 2022-01-19 DIAGNOSIS — D631 Anemia in chronic kidney disease: Secondary | ICD-10-CM | POA: Diagnosis not present

## 2022-01-19 DIAGNOSIS — N281 Cyst of kidney, acquired: Secondary | ICD-10-CM | POA: Diagnosis not present

## 2022-01-21 ENCOUNTER — Telehealth: Payer: Self-pay

## 2022-01-21 ENCOUNTER — Telehealth: Payer: Medicare Other

## 2022-01-21 DIAGNOSIS — I5030 Unspecified diastolic (congestive) heart failure: Secondary | ICD-10-CM | POA: Diagnosis not present

## 2022-01-21 DIAGNOSIS — J449 Chronic obstructive pulmonary disease, unspecified: Secondary | ICD-10-CM | POA: Diagnosis not present

## 2022-01-21 NOTE — Telephone Encounter (Signed)
?  Care Management  ? ?Follow Up Note ? ? ?01/21/2022 ?Name: Nathaniel Little MRN: [id number] DOB: 1946/07/23 ? ? ?Referred by: Jearld Fenton, NP ?Reason for referral : Chronic Care Management (RNCM: Follow up for Chronic Disease Management and Care Coordination Needs ) ? ? ?A second unsuccessful telephone outreach was attempted today. The patient was referred to the case management team for assistance with care management and care coordination.  ? ?Follow Up Plan: The care management team will reach out to the patient again over the next 30 to 60 days.  ? ?Noreene Larsson RN, MSN, CCM ?Community Care Coordinator ?[place] [facility] ?[facility] ?Mobile: [phone]  ?

## 2022-01-23 DIAGNOSIS — J449 Chronic obstructive pulmonary disease, unspecified: Secondary | ICD-10-CM | POA: Diagnosis not present

## 2022-01-23 DIAGNOSIS — I5030 Unspecified diastolic (congestive) heart failure: Secondary | ICD-10-CM | POA: Diagnosis not present

## 2022-01-25 ENCOUNTER — Telehealth: Payer: Self-pay

## 2022-01-25 NOTE — Telephone Encounter (Signed)
Attempted call back for pt. Pt called for an update on his diabetic shoe order but this pt has never picked out shoes or did an impression with Aaron Edelman. CMN has already been received but I am unable to lvm to explain.

## 2022-01-26 ENCOUNTER — Encounter: Payer: Self-pay | Admitting: *Deleted

## 2022-01-26 ENCOUNTER — Other Ambulatory Visit: Payer: Self-pay | Admitting: Family Medicine

## 2022-01-26 DIAGNOSIS — N3001 Acute cystitis with hematuria: Secondary | ICD-10-CM

## 2022-01-28 ENCOUNTER — Ambulatory Visit (INDEPENDENT_AMBULATORY_CARE_PROVIDER_SITE_OTHER): Payer: Medicare Other | Admitting: Podiatry

## 2022-01-28 DIAGNOSIS — L97512 Non-pressure chronic ulcer of other part of right foot with fat layer exposed: Secondary | ICD-10-CM | POA: Diagnosis not present

## 2022-01-28 DIAGNOSIS — E1142 Type 2 diabetes mellitus with diabetic polyneuropathy: Secondary | ICD-10-CM | POA: Diagnosis not present

## 2022-01-28 NOTE — Telephone Encounter (Signed)
Requested medication (s) are due for refill today: No ? ?Requested medication (s) are on the active medication list: No ? ?Last refill:  12/02/21 ? ?Future visit scheduled: No ? ?Notes to clinic:  See request. ? ? ? ?Requested Prescriptions  ?Pending Prescriptions Disp Refills  ? ciprofloxacin (CIPRO) 500 MG tablet [Pharmacy Med Name: CIPROFLOXACIN HCL 500 MG TAB] 14 tablet 0  ?  Sig: TAKE 1 TABLET BY MOUTH TWICE DAILY FOR 7DAYS  ?  ? Off-Protocol Failed - 01/26/2022 10:54 AM  ?  ?  Failed - Medication not assigned to a protocol, review manually.  ?  ?  Passed - Valid encounter within last 12 months  ?  Recent Outpatient Visits   ? ?      ? 1 month ago DM type 2 with diabetic peripheral neuropathy ([place])  ? [place], DO  ? 2 months ago Callus of foot  ? [facility] [place], Coralie Keens, NP  ? 2 months ago Encounter for general adult medical examination with abnormal findings  ? [facility] [place], [place], NP  ? 5 months ago Blood in semen  ? [place], NP  ? 7 months ago Abdominal bloating  ? [facility] [place], Coralie Keens, NP  ? ?  ?  ?Future Appointments   ? ?        ? In 2 weeks [place], Coralie Keens, NP [facility] [place]  ? ?  ? ? ?  ?  ?  ? ?

## 2022-01-29 ENCOUNTER — Telehealth: Payer: Self-pay

## 2022-01-29 NOTE — Chronic Care Management (AMB) (Signed)
?  Care Management  ? ?Note ? ?01/29/2022 ?Name: Nathaniel Little MRN: [id number] DOB: 14-Jan-1946 ? ?[name] is a 76 y.o. year old male who is a primary care patient of Garnette Gunner, Coralie Keens, NP and is actively engaged with the care management team. I reached out to [name] by phone today to assist with re-scheduling a follow up visit with the RN Case Manager ? ?Follow up plan: ?Unsuccessful telephone outreach attempt made. A HIPAA compliant phone message was left for the patient providing contact information and requesting a return call.  ?The care management team will reach out to the patient again over the next 7 days.  ?If patient returns call to provider office, please advise to call [place]  at [phone] ? ?Noreene Larsson, RMA ?Care Guide, Embedded Care Coordination ?[facility]  Care Management  ?[place], [place] [address] ?Direct Dial: [phone] ?[email or web address] ?Website: [email or web address]  ? ?

## 2022-02-01 ENCOUNTER — Inpatient Hospital Stay: Payer: Medicare Other | Attending: Oncology

## 2022-02-01 DIAGNOSIS — D490 Neoplasm of unspecified behavior of digestive system: Secondary | ICD-10-CM

## 2022-02-01 DIAGNOSIS — C3432 Malignant neoplasm of lower lobe, left bronchus or lung: Secondary | ICD-10-CM

## 2022-02-01 DIAGNOSIS — Z85118 Personal history of other malignant neoplasm of bronchus and lung: Secondary | ICD-10-CM | POA: Diagnosis not present

## 2022-02-01 DIAGNOSIS — Z08 Encounter for follow-up examination after completed treatment for malignant neoplasm: Secondary | ICD-10-CM | POA: Diagnosis not present

## 2022-02-01 DIAGNOSIS — R768 Other specified abnormal immunological findings in serum: Secondary | ICD-10-CM | POA: Diagnosis not present

## 2022-02-01 LAB — COMPREHENSIVE METABOLIC PANEL
ALT: 37 U/L (ref 0–44)
AST: 52 U/L — ABNORMAL HIGH (ref 15–41)
Albumin: 3.5 g/dL (ref 3.5–5.0)
Alkaline Phosphatase: 133 U/L — ABNORMAL HIGH (ref 38–126)
Anion gap: 5 (ref 5–15)
BUN: 31 mg/dL — ABNORMAL HIGH (ref 8–23)
CO2: 27 mmol/L (ref 22–32)
Calcium: 8.7 mg/dL — ABNORMAL LOW (ref 8.9–10.3)
Chloride: 102 mmol/L (ref 98–111)
Creatinine, Ser: 1.5 mg/dL — ABNORMAL HIGH (ref 0.61–1.24)
GFR, Estimated: 48 mL/min — ABNORMAL LOW (ref >60)
Glucose, Bld: 265 mg/dL — ABNORMAL HIGH (ref 70–99)
Potassium: 4.5 mmol/L (ref 3.5–5.1)
Sodium: 134 mmol/L — ABNORMAL LOW (ref 135–145)
Total Bilirubin: 0.6 mg/dL (ref 0.3–1.2)
Total Protein: 7 g/dL (ref 6.5–8.1)

## 2022-02-01 LAB — CBC WITH DIFFERENTIAL/PLATELET
Abs Immature Granulocytes: 0.03 10*3/uL (ref 0.00–0.07)
Basophils Absolute: 0 10*3/uL (ref 0.0–0.1)
Basophils Relative: 0 %
Eosinophils Absolute: 0.2 10*3/uL (ref 0.0–0.5)
Eosinophils Relative: 3 %
HCT: 36.8 % — ABNORMAL LOW (ref 39.0–52.0)
Hemoglobin: 12.3 g/dL — ABNORMAL LOW (ref 13.0–17.0)
Immature Granulocytes: 0 %
Lymphocytes Relative: 16 %
Lymphs Abs: 1.4 10*3/uL (ref 0.7–4.0)
MCH: 29.8 pg (ref 26.0–34.0)
MCHC: 33.4 g/dL (ref 30.0–36.0)
MCV: 89.1 fL (ref 80.0–100.0)
Monocytes Absolute: 0.5 10*3/uL (ref 0.1–1.0)
Monocytes Relative: 6 %
Neutro Abs: 6.5 10*3/uL (ref 1.7–7.7)
Neutrophils Relative %: 75 %
Platelets: 196 10*3/uL (ref 150–400)
RBC: 4.13 MIL/uL — ABNORMAL LOW (ref 4.22–5.81)
RDW: 14.3 % (ref 11.5–15.5)
WBC: 8.7 10*3/uL (ref 4.0–10.5)
nRBC: 0 % (ref 0.0–0.2)

## 2022-02-02 DIAGNOSIS — E1122 Type 2 diabetes mellitus with diabetic chronic kidney disease: Secondary | ICD-10-CM | POA: Diagnosis not present

## 2022-02-02 DIAGNOSIS — N1831 Chronic kidney disease, stage 3a: Secondary | ICD-10-CM | POA: Diagnosis not present

## 2022-02-02 LAB — KAPPA/LAMBDA LIGHT CHAINS
Kappa free light chain: 62.3 mg/L — ABNORMAL HIGH (ref 3.3–19.4)
Kappa, lambda light chain ratio: 2.18 — ABNORMAL HIGH (ref 0.26–1.65)
Lambda free light chains: 28.6 mg/L — ABNORMAL HIGH (ref 5.7–26.3)

## 2022-02-02 NOTE — Telephone Encounter (Signed)
error 

## 2022-02-04 ENCOUNTER — Encounter: Payer: Self-pay | Admitting: Podiatry

## 2022-02-04 LAB — MULTIPLE MYELOMA PANEL, SERUM
Albumin SerPl Elph-Mcnc: 3.3 g/dL (ref 2.9–4.4)
Albumin/Glob SerPl: 1.2 (ref 0.7–1.7)
Alpha 1: 0.2 g/dL (ref 0.0–0.4)
Alpha2 Glob SerPl Elph-Mcnc: 0.8 g/dL (ref 0.4–1.0)
B-Globulin SerPl Elph-Mcnc: 0.9 g/dL (ref 0.7–1.3)
Gamma Glob SerPl Elph-Mcnc: 1.1 g/dL (ref 0.4–1.8)
Globulin, Total: 3 g/dL (ref 2.2–3.9)
IgA: 238 mg/dL (ref 61–437)
IgG (Immunoglobin G), Serum: 1005 mg/dL (ref 603–1613)
IgM (Immunoglobulin M), Srm: 203 mg/dL — ABNORMAL HIGH (ref 15–143)
Total Protein ELP: 6.3 g/dL (ref 6.0–8.5)

## 2022-02-04 NOTE — Progress Notes (Signed)
?Subjective:  ?Patient ID: Nathaniel Little, male    DOB: September 12, 1946,  MRN: [id number] ? ?Chief Complaint  ?Patient presents with  ? Foot Pain  ? Callouses  ? ? ?76 y.o. male presents for wound care.  Patient presents with right submetatarsal 3 ulceration with fat layer exposed.  Patient has underlying plantarflexed third metatarsal as well.  Patient states his not too painful however he is noticing the ulcer.  Is been present for quite some time is progressive gotten worse.  He would like to discuss treatment options..  He does not will lose his foot he is a diabetic with last A1c of 10.9. ? ? ?Review of Systems: Negative except as noted in the HPI. Denies N/V/F/Ch. ? ?Past Medical History:  ?Diagnosis Date  ? (HFpEF) heart failure with preserved ejection fraction ([place])   ? a. 08/2017 Echo: EF 60-65%, Gr1 DD.  ? Arthritis   ? COPD (chronic obstructive pulmonary disease) ([place])   ? Coronary artery disease 2021  ? Depression   ? Diabetes mellitus without complication ([place])   ? Hypertension   ? Incidental pulmonary nodule, greater than or equal to 29mm 03/11/2020  ? Left lower lobe - suspicious for bronchogenic neoplasm  ? Lymphedema   ? Morbid obesity ([place])   ? Prostate cancer ([place]) 03/2013  ? ? PANCREATIC  ? 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 s/p TAVR with a 29 mm Edwards Sapien 3 THV via the TF approach by Drs Buena Irish & Roxy Manns  ? Severe aortic stenosis   ? Sleep apnea with use of continuous positive airway pressure (CPAP)   ? Tobacco abuse   ? Tremor   ? HEAD  ? ? ?Current Outpatient Medications:  ?  albuterol (VENTOLIN HFA) 108 (90 Base) MCG/ACT inhaler, Inhale 2 puffs into the lungs every 6 (six) hours as needed for wheezing or shortness of breath., Disp: 54 g, Rfl: 1 ?  aspirin EC 81 MG tablet, Take 81 mg by mouth daily., Disp: , Rfl:  ?  atorvastatin (LIPITOR) 10 MG tablet, Take 1 tablet (10 mg total) by mouth daily., Disp: 30 tablet, Rfl: 2 ?  BREZTRI AEROSPHERE 160-9-4.8 MCG/ACT AERO,  INHALE 2 PUFFS INTO THE LUNGS TWICE DAILY, Disp: 10.7 g, Rfl: 0 ?  calcium-vitamin D (OSCAL WITH D) 250-125 MG-UNIT tablet, Take 1 tablet by mouth daily., Disp: , Rfl:  ?  carvedilol (COREG) 6.25 MG tablet, Take 1 tablet (6.25 mg total) by mouth 2 (two) times daily with a meal., Disp: 180 tablet, Rfl: 3 ?  cloNIDine (CATAPRES) 0.1 MG tablet, Take 1 tablet (0.1 mg total) by mouth 2 (two) times daily., Disp: 180 tablet, Rfl: 3 ?  ezetimibe (ZETIA) 10 MG tablet, Take 1 tablet (10 mg total) by mouth daily., Disp: 90 tablet, Rfl: 3 ?  fluticasone (FLONASE) 50 MCG/ACT nasal spray, , Disp: , Rfl:  ?  gabapentin (NEURONTIN) 100 MG capsule, TAKE 1-2 CAPSULES BY MOUTH AT BEDTIME, Disp: 180 capsule, Rfl: 0 ?  glucose blood (ACCU-CHEK GUIDE) test strip, Check blood sugar twice daily, Disp: 100 each, Rfl: 12 ?  insulin glargine (LANTUS) 100 unit/mL SOPN, Inject 22 Units into the skin at bedtime., Disp: , Rfl:  ?  ipratropium-albuterol (DUONEB) 0.5-2.5 (3) MG/3ML SOLN, Take 3 mLs by nebulization every 6 (six) hours as needed (Asthma)., Disp: 360 mL, Rfl: 12 ?  ketoconazole (NIZORAL) 2 % cream, APPLY 1 APPLICATION TOPICALLY ONCE DAILY, Disp: 30 g, Rfl: 0 ?  lisinopril (ZESTRIL) 5 MG tablet,  Take 1 tablet (5 mg total) by mouth daily., Disp: 90 tablet, Rfl: 3 ?  metFORMIN (GLUCOPHAGE) 500 MG tablet, Take 1,000 mg by mouth in the morning and at bedtime., Disp: , Rfl:  ?  pantoprazole (PROTONIX) 40 MG tablet, Take 1 tablet (40 mg total) by mouth 2 (two) times daily., Disp: 60 tablet, Rfl: 11 ?  sennosides-docusate sodium (SENOKOT-S) 8.6-50 MG tablet, Take 1 tablet by mouth in the morning and at bedtime., Disp: 180 tablet, Rfl: 0 ?  torsemide (DEMADEX) 10 MG tablet, Take 1 tablet (10 mg total) by mouth daily., Disp: 30 tablet, Rfl: 6 ? ?Social History  ? ?Tobacco Use  ?Smoking Status Former  ? Packs/day: 4.00  ? Years: 68.00  ? Pack years: 272.00  ? Types: Cigarettes  ? Quit date: 10/04/2021  ? Years since quitting: 0.3  ?Smokeless  Tobacco Former  ? Types: Chew, Snuff  ? Quit date: 2000  ?Tobacco Comments  ? 1 cigs a day   ? ? ?Allergies  ?Allergen Reactions  ? Codeine Itching  ? ?Objective:  ?There were no vitals filed for this visit. ?There is no height or weight on file to calculate BMI. ?Constitutional Well developed. ?Well nourished.  ?Vascular Dorsalis pedis pulses faintly palpable bilaterally. ?Posterior tibial pulses faintly palpable bilaterally. ?Capillary refill normal to all digits.  ?No cyanosis or clubbing noted. ?Pedal hair growth normal.  ?Neurologic Normal speech. ?Oriented to person, place, and time. ?Protective sensation absent  ?Dermatologic Wound Location: Right submetatarsal 3 ulceration with fat layer exposed.  No purulent drainage noted.  No malodor present.  No clinical signs of infection noted. ?Wound Base: Mixed Granular/Fibrotic ?Peri-wound: Calloused ?Exudate: Scant/small amount Serous exudate ?Wound Measurements: ?-See above  ?Orthopedic: No pain to palpation either foot.  ? ?Radiographs: None ?Assessment:  ? ?1. DM type 2 with diabetic peripheral neuropathy ([place])   ?2. Right foot ulcer, with fat layer exposed ([place])   ? ?Plan:  ?Patient was evaluated and treated and all questions answered. ? ?Ulcer submetatarsal 3 ulceration with fat layer exposed with underlying plantarflexed third metatarsal ?-Debridement as below. ?-Dressed with Betadine wet-to-dry, DSD. ?-Continue off-loading with surgical shoe. ? ?Procedure: Excisional Debridement of Wound ?Tool: Sharp chisel blade/tissue nipper ?Rationale: Removal of non-viable soft tissue from the wound to promote healing.  ?Anesthesia: none ?Pre-Debridement Wound Measurements: 1 cm x 0.5 cm x 0.3 cm  ?Post-Debridement Wound Measurements: 1.2 cm x 0.7 cm x 0.3 cm  ?Type of Debridement: Sharp Excisional ?Tissue Removed: Non-viable soft tissue ?Blood loss: Minimal (<50cc) ?Depth of Debridement: subcutaneous tissue. ?Technique: Sharp excisional debridement to bleeding, viable  wound base.  ?Wound Progress: This is my initial evaluation of continue monitor clinical progression of the wound ?Site healing conversation 7 ?Dressing: Dry, sterile, compression dressing. ?Disposition: Patient tolerated procedure well. Patient to return in 1 week for follow-up. ? ?No follow-ups on file. ? ?  ?

## 2022-02-05 ENCOUNTER — Inpatient Hospital Stay (HOSPITAL_BASED_OUTPATIENT_CLINIC_OR_DEPARTMENT_OTHER): Payer: Medicare Other | Admitting: Oncology

## 2022-02-05 ENCOUNTER — Encounter: Payer: Self-pay | Admitting: Oncology

## 2022-02-05 VITALS — BP 131/70 | HR 93 | Temp 97.0°F | Resp 18 | Wt 202.2 lb

## 2022-02-05 DIAGNOSIS — Z08 Encounter for follow-up examination after completed treatment for malignant neoplasm: Secondary | ICD-10-CM

## 2022-02-05 DIAGNOSIS — R768 Other specified abnormal immunological findings in serum: Secondary | ICD-10-CM

## 2022-02-05 DIAGNOSIS — Z85118 Personal history of other malignant neoplasm of bronchus and lung: Secondary | ICD-10-CM

## 2022-02-05 DIAGNOSIS — D490 Neoplasm of unspecified behavior of digestive system: Secondary | ICD-10-CM | POA: Diagnosis not present

## 2022-02-05 NOTE — Progress Notes (Signed)
? ? ? ?Hematology/Oncology Consult note ?[facility]  ?[phone] [phone] ? ?Patient Care Team: ?Jearld Fenton, NP as PCP - General (Internal Medicine) ?Minna Merritts, MD as PCP - Cardiology (Cardiology) ?Minna Merritts, MD as Consulting Physician (Cardiology) ?Vanita Ingles, RN as Case Manager (General Practice) ?Delles, Virl Diamond, RPH-CPP as Pharmacist ?Arvil Chaco, PA-C (Inactive) as Physician Assistant (Cardiology)  ? ?Name of the patient: Nathaniel Little  ?[id number]  ?June 11, 1946  ? ?Date of visit: 02/05/22 ? ?Diagnosis- 1.  History of IPMN s/p Whipple's ?2.  Stage Ia adenocarcinoma of the left lower lobe s/p resection ? ?Chief complaint/ Reason for visit-discuss CT scan results and further management ? ?Heme/Onc history: Patient is a 76 year old male who was last seen by me in 2018 for cystic mass in the pancreas which was ultimately identified as IPMN for which she underwent Whipple surgery at [facility].  Recently again he was admitted to the hospital for symptoms of pancreatitis following ERCP for choledocholithiasis.  Patient had undergone CT angio chest on 05/25/2020 for symptoms of left upper quadrant abdominal pain which incidentally showed a solid 1.5 cm left lower lobe pulmonary nodule which had increased from 1.4 cm on prior CT in June 2021.  He has also undergone a PET scan in June 2021 which showed mild hypermetabolism in this area concerning for primary bronchogenic neoplasm.  No locoregional adenopathy or distant metastatic disease. ?  ?Patient noted to have a 1.5 cm left lower lobe lung nodule for which he underwent definitive surgery left lower lobe lobectomy done on 11/17/2020 showed invasive mucinous adenocarcinoma 1.4 cm.  Margins negative.  No visceral pleural invasion.  0 out of 18 lymph nodes positive for malignancy. ?  ?  ? ?Interval history-patient feels at his baseline state of health.  He has chronic fatigue and exertional  shortness of breath.  He has some mild productive cough but denies any fever ? ?ECOG PS- 2 ?Pain scale- 0 ? ? ?Review of systems- Review of Systems  ?Constitutional:  Positive for malaise/fatigue. Negative for chills, fever and weight loss.  ?HENT:  Negative for congestion, ear discharge and nosebleeds.   ?Eyes:  Negative for blurred vision.  ?Respiratory:  Positive for shortness of breath. Negative for cough, hemoptysis, sputum production and wheezing.   ?Cardiovascular:  Negative for chest pain, palpitations, orthopnea and claudication.  ?Gastrointestinal:  Negative for abdominal pain, blood in stool, constipation, diarrhea, heartburn, melena, nausea and vomiting.  ?Genitourinary:  Negative for dysuria, flank pain, frequency, hematuria and urgency.  ?Musculoskeletal:  Negative for back pain, joint pain and myalgias.  ?Skin:  Negative for rash.  ?Neurological:  Negative for dizziness, tingling, focal weakness, seizures, weakness and headaches.  ?Endo/Heme/Allergies:  Does not bruise/bleed easily.  ?Psychiatric/Behavioral:  Negative for depression and suicidal ideas. The patient does not have insomnia.    ? ? ?Allergies  ?Allergen Reactions  ? Codeine Itching  ? ? ? ?Past Medical History:  ?Diagnosis Date  ? (HFpEF) heart failure with preserved ejection fraction ([place])   ? a. 08/2017 Echo: EF 60-65%, Gr1 DD.  ? Arthritis   ? COPD (chronic obstructive pulmonary disease) ([place])   ? Coronary artery disease 2021  ? Depression   ? Diabetes mellitus without complication ([place])   ? Hypertension   ? Incidental pulmonary nodule, greater than or equal to 53m 03/11/2020  ? Left lower lobe - suspicious for bronchogenic neoplasm  ? Lymphedema   ? Morbid obesity ([place]   ?  Prostate cancer ([place]) 03/2013  ? ? PANCREATIC  ? 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 s/p TAVR with a 29 mm Edwards Sapien 3 THV via the TF approach by Drs Buena Irish & Roxy Manns  ? Severe aortic stenosis   ? Sleep apnea with use of continuous  positive airway pressure (CPAP)   ? Tobacco abuse   ? Tremor   ? HEAD  ? ? ? ?Past Surgical History:  ?Procedure Laterality Date  ? ADENOIDECTOMY    ? APPENDECTOMY    ? CARDIAC CATHETERIZATION    ? CATARACT EXTRACTION W/PHACO Left 11/28/2018  ? Procedure: CATARACT EXTRACTION PHACO AND INTRAOCULAR LENS PLACEMENT (IOC) LEFT, DIABETIC;  Surgeon: Birder Robson, MD;  Location: [facility] ORS;  Service: Ophthalmology;  Laterality: Left;  [name]  00:56 ?CDE 9.44 ?Fluid pack lot # [id number]  ? EUS N/A 10/13/2017  ? Procedure: FULL UPPER ENDOSCOPIC ULTRASOUND (EUS) RADIAL;  Surgeon: Holly Bodily, MD;  Location: [facility] ENDOSCOPY;  Service: Gastroenterology;  Laterality: N/A;  ? FLEXIBLE BRONCHOSCOPY N/A 11/19/2020  ? Procedure: FLEXIBLE BRONCHOSCOPY;  Surgeon: Melrose Nakayama, MD;  Location: [facility];  Service: Thoracic;  Laterality: N/A;  ? INTERCOSTAL NERVE BLOCK Left 11/17/2020  ? Procedure: INTERCOSTAL NERVE BLOCK;  Surgeon: Melrose Nakayama, MD;  Location: [facility];  Service: Thoracic;  Laterality: Left;  ? NODE DISSECTION Left 11/17/2020  ? Procedure: NODE DISSECTION;  Surgeon: Melrose Nakayama, MD;  Location: [facility];  Service: Thoracic;  Laterality: Left;  ? PANCREATICODUODENECTOMY  07/23/2018  ? RIGHT/LEFT HEART CATH AND CORONARY ANGIOGRAPHY N/A 02/21/2020  ? Procedure: RIGHT/LEFT HEART CATH AND CORONARY ANGIOGRAPHY;  Surgeon: Minna Merritts, MD;  Location: [facility];  Service: Cardiovascular;  Laterality: N/A;  ? TEE WITHOUT CARDIOVERSION N/A 04/01/2020  ? Procedure: TRANSESOPHAGEAL ECHOCARDIOGRAM (TEE);  Surgeon: Burnell Blanks, MD;  Location: [facility];  Service: Open Heart Surgery;  Laterality: N/A;  ? TONSILLECTOMY    ? TRANSCATHETER AORTIC VALVE REPLACEMENT, TRANSFEMORAL N/A 04/01/2020  ? Procedure: TRANSCATHETER AORTIC VALVE REPLACEMENT, TRANSFEMORAL;  Surgeon: Burnell Blanks, MD;  Location: [facility];  Service: Open Heart Surgery;  Laterality: N/A;  ? wipple     ? ? ?Social History  ? ?Socioeconomic History  ? Marital status: Widowed  ?  Spouse name: Not on file  ? Number of children: 2  ? Years of education: Not on file  ? Highest education level: Not on file  ?Occupational History  ? Occupation: retired Social research officer, government  ?Tobacco Use  ? Smoking status: Former  ?  Packs/day: 4.00  ?  Years: 68.00  ?  Pack years: 272.00  ?  Types: Cigarettes  ?  Quit date: 10/04/2021  ?  Years since quitting: 0.3  ? Smokeless tobacco: Former  ?  Types: Chew, Snuff  ?  Quit date: 2000  ? Tobacco comments:  ?  1 cigs a day   ?Vaping Use  ? Vaping Use: Never used  ?Substance and Sexual Activity  ? Alcohol use: Not Currently  ?  Alcohol/week: 0.0 - 1.0 standard drinks  ?  Comment: quit 40years ago  ? Drug use: No  ? Sexual activity: Not Currently  ?Other Topics Concern  ? Not on file  ?Social History Narrative  ? Living at [facility]   ? ?Social Determinants of Health  ? ?Financial Resource Strain: Low Risk   ? Difficulty of Paying Living Expenses: Not hard at all  ?Food Insecurity: No  Food Insecurity  ? Worried About Charity fundraiser in the Last Year: Never true  ? Ran Out of Food in the Last Year: Never true  ?Transportation Needs: No Transportation Needs  ? Lack of Transportation (Medical): No  ? Lack of Transportation (Non-Medical): No  ?Physical Activity: Inactive  ? Days of Exercise per Week: 0 days  ? Minutes of Exercise per Session: 0 min  ?Stress: Stress Concern Present  ? Feeling of Stress : To some extent  ?Social Connections: Moderately Isolated  ? Frequency of Communication with Friends and Family: More than three times a week  ? Frequency of Social Gatherings with Friends and Family: More than three times a week  ? Attends Religious Services: More than 4 times per year  ? Active Member of Clubs or Organizations: No  ? Attends Archivist Meetings: Never  ? Marital Status: Widowed  ?Intimate Partner Violence: Not At Risk  ? Fear of Current or Ex-Partner: No  ?  Emotionally Abused: No  ? Physically Abused: No  ? Sexually Abused: No  ? ? ?Family History  ?Problem Relation Age of Onset  ? Breast cancer Mother   ? Cancer Father   ?     Black Lung  ? Cerebral palsy Brother

## 2022-02-09 NOTE — Chronic Care Management (AMB) (Signed)
?  Chronic Care Management ?Note ? ?02/09/2022 ?Name: Nathaniel Little MRN: [id number] DOB: 09-16-1946 ? ?[name] is a 76 y.o. year old male who is a primary care patient of Garnette Gunner, Coralie Keens, NP and is actively engaged with the care management team. I reached out to [name] by phone today to assist with re-scheduling a follow up visit with the RN Case Manager ? ?Follow up plan: ?Telephone appointment with care management team member scheduled for:03/22/2022 ? ?Noreene Larsson, RMA ?Care Guide, Embedded Care Coordination ?[facility]  Care Management  ?[place], [place] [address] ?Direct Dial: [phone] ?[email or web address] ?Website: [email or web address]   ?

## 2022-02-11 ENCOUNTER — Encounter: Payer: Self-pay | Admitting: Podiatry

## 2022-02-11 ENCOUNTER — Ambulatory Visit (INDEPENDENT_AMBULATORY_CARE_PROVIDER_SITE_OTHER): Payer: Medicare Other | Admitting: Podiatry

## 2022-02-11 DIAGNOSIS — M79674 Pain in right toe(s): Secondary | ICD-10-CM

## 2022-02-11 DIAGNOSIS — E1142 Type 2 diabetes mellitus with diabetic polyneuropathy: Secondary | ICD-10-CM | POA: Diagnosis not present

## 2022-02-11 DIAGNOSIS — B351 Tinea unguium: Secondary | ICD-10-CM | POA: Diagnosis not present

## 2022-02-11 DIAGNOSIS — M79675 Pain in left toe(s): Secondary | ICD-10-CM | POA: Diagnosis not present

## 2022-02-11 DIAGNOSIS — D689 Coagulation defect, unspecified: Secondary | ICD-10-CM | POA: Diagnosis not present

## 2022-02-11 NOTE — Progress Notes (Signed)
This patient returns to my office for at risk foot care.  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  This patient is unable to cut nails himself since the patient cannot reach his nails.These nails are painful walking and wearing shoes. Patient also has been treated by Dr.  Posey Pronto for ulcer right foot.  Patient is wearing a bandage and walking in surgical shoe. This patient presents for at risk foot care today. ? ?General Appearance  Alert, conversant and in no acute stress. ? ?Vascular  Dorsalis pedis and posterior tibial  pulses are palpable  bilaterally.  Capillary return is within normal limits  bilaterally. Temperature is within normal limits  bilaterally. ? ?Neurologic  Senn-Weinstein monofilament wire test diminished  bilaterally. Muscle power within normal limits bilaterally. ? ?Nails Thick disfigured discolored nails with subungual debris  from hallux to fifth toes bilaterally. No evidence of bacterial infection or drainage bilaterally. ? ?Orthopedic  No limitations of motion  feet .  No crepitus or effusions noted.  No bony pathology .  Hammer toes  B/L. ? ?Skin  normotropic skin with no porokeratosis noted bilaterally.  No signs of infections or ulcers noted.  Skin lesion   sub 3/4 right foot. ? ?Onychomycosis  Pain in right toes  Pain in left toes   ? ?Consent was obtained for treatment procedures.   Mechanical debridement of nails 1-5  bilaterally performed with a nail nipper.  Filed with dremel without incident.  Told him to continue follow up with Dr.  Posey Pronto. ? ? ?Return office visit    3 months                  Told patient to return for periodic foot care and evaluation due to potential at risk complications. ? ? ?Gardiner Barefoot DPM  ?

## 2022-02-12 ENCOUNTER — Ambulatory Visit: Payer: Medicare Other | Admitting: Internal Medicine

## 2022-02-16 ENCOUNTER — Other Ambulatory Visit: Payer: Self-pay | Admitting: Internal Medicine

## 2022-02-17 NOTE — Telephone Encounter (Signed)
Requested Prescriptions  ?Pending Prescriptions Disp Refills  ?? BREZTRI AEROSPHERE 160-9-4.8 MCG/ACT AERO [Pharmacy Med Name: BREZTRI AEROSPHERE 160-9-4.8 MCG/AC] 10.7 g 0  ?  Sig: INHALE 2 PUFFS INTO THE LUNGS TWICE DAILY  ?  ? Off-Protocol Failed - 02/16/2022  3:20 PM  ?  ?  Failed - Medication not assigned to a protocol, review manually.  ?  ?  Passed - Valid encounter within last 12 months  ?  Recent Outpatient Visits   ?      ? 2 months ago DM type 2 with diabetic peripheral neuropathy ([place])  ? [place], DO  ? 2 months ago Callus of foot  ? [facility], Coralie Keens, NP  ? 3 months ago Encounter for general adult medical examination with abnormal findings  ? [facility] [place], [place] W, NP  ? 6 months ago Blood in semen  ? [facility] [place], [place], NP  ? 8 months ago Abdominal bloating  ? [facility], Coralie Keens, NP  ?  ?  ? ?  ?  ?  ? ? ?

## 2022-02-20 DIAGNOSIS — I5030 Unspecified diastolic (congestive) heart failure: Secondary | ICD-10-CM | POA: Diagnosis not present

## 2022-02-20 DIAGNOSIS — J449 Chronic obstructive pulmonary disease, unspecified: Secondary | ICD-10-CM | POA: Diagnosis not present

## 2022-02-22 DIAGNOSIS — J449 Chronic obstructive pulmonary disease, unspecified: Secondary | ICD-10-CM | POA: Diagnosis not present

## 2022-02-22 DIAGNOSIS — I5030 Unspecified diastolic (congestive) heart failure: Secondary | ICD-10-CM | POA: Diagnosis not present

## 2022-02-24 DIAGNOSIS — E1142 Type 2 diabetes mellitus with diabetic polyneuropathy: Secondary | ICD-10-CM

## 2022-02-24 LAB — GLUCOSE, POCT (MANUAL RESULT ENTRY): POC Glucose: 215 mg/dl — AB (ref 70–99)

## 2022-02-24 NOTE — Congregational Nurse Program (Signed)
  Dept: [facility] Nurse Program Note  Date of Encounter: 02/24/2022 Client to clinic today for vital sign and blood glucose check. He is a new participant. He reports he does have a PCP and podiatrist. Has upcoming appointments scheduled. He is currently living in temporary housing and does not have refrigeration and is not using his insulin as he has no where to store it, he also reports he is unsure where his BP medications are as he has recently moved and not "unpacked all of his belongings". Encourage client to return to clinic as often as possible to have his blood sugar and BP monitored. He does report having a lead on a room that will provide him with better shelter and facilities. BP (!) 148/72 (BP Location: Left Arm, Patient Position: Sitting, Cuff Size: Normal)   Pulse 94   SpO2 99%   Past Medical History: Past Medical History:  Diagnosis Date   (HFpEF) heart failure with preserved ejection fraction ([place])    a. 08/2017 Echo: EF 60-65%, Gr1 DD.   Arthritis    COPD (chronic obstructive pulmonary disease) ([place])    Coronary artery disease 2021   Depression    Diabetes mellitus without complication (H[facility])    Hypertension    Incidental pulmonary nodule, greater than or equal to 83mm 03/11/2020   Left lower lobe - suspicious for bronchogenic neoplasm   Lymphedema    Morbid obesity ([place])    Prostate cancer ([place]) 03/2013   ? PANCREATIC   S/P TAVR (transcatheter aortic valve replacement) 04/01/2020   s/p TAVR with a 29 mm Edwards Sapien 3 THV via the TF approach by Drs Buena Irish & Roxy Manns   Severe aortic stenosis    Sleep apnea with use of continuous positive airway pressure (CPAP)    Tobacco abuse    Tremor    HEAD    Encounter Details:  CNP Questionnaire - 02/24/22 1254       Questionnaire   Do you give verbal consent to treat you today? Yes    Location Patient [place] or Organization    Patient Status Unknown   client  is in  temporary housing   Insurance Medicare    Insurance Referral N/A    Medication N/A    Medical Provider Yes    Screening Referrals N/A    Medical Referral N/A    Medical Appointment Made N/A   client reports he has a podiatry apt tomorrow 5/25   Food N/A    Transportation N/A   has his own vehicle   Housing/Utilities No permanent housing    Interpersonal Safety N/A    Intervention Blood glucose;Blood pressure;Support    ED Visit Averted N/A    Life-Saving Intervention Made N/A

## 2022-02-25 ENCOUNTER — Ambulatory Visit (INDEPENDENT_AMBULATORY_CARE_PROVIDER_SITE_OTHER): Payer: Medicare Other | Admitting: Podiatry

## 2022-02-25 DIAGNOSIS — E1142 Type 2 diabetes mellitus with diabetic polyneuropathy: Secondary | ICD-10-CM | POA: Diagnosis not present

## 2022-02-25 DIAGNOSIS — M216X1 Other acquired deformities of right foot: Secondary | ICD-10-CM | POA: Diagnosis not present

## 2022-02-25 NOTE — Progress Notes (Signed)
Subjective:  Patient ID: Nathaniel Little, male    DOB: Nov 11, 1945,  MRN: [id number]  Chief Complaint  Patient presents with   Callouses    76 y.o. male presents for wound care.  Patient presents with right submetatarsal 3 ulceration with fat layer exposed.  Patient has underlying plantarflexed third metatarsal as well.  Patient states his not too painful however he is noticing the ulcer.  Is been present for quite some time is progressive gotten worse.  He would like to discuss treatment options..  He does not will lose his foot he is a diabetic with last A1c of 10.9.   Review of Systems: Negative except as noted in the HPI. Denies N/V/F/Ch.  Past Medical History:  Diagnosis Date   (HFpEF) heart failure with preserved ejection fraction ([place])    a. 08/2017 Echo: EF 60-65%, Gr1 DD.   Arthritis    COPD (chronic obstructive pulmonary disease) ([place])    Coronary artery disease 2021   Depression    Diabetes mellitus without complication (HCC)    Hypertension    Incidental pulmonary nodule, greater than or equal to 37mm 03/11/2020   Left lower lobe - suspicious for bronchogenic neoplasm   Lymphedema    Morbid obesity ([place])    Prostate cancer ([place]) 03/2013   ? PANCREATIC   S/P TAVR (transcatheter aortic valve replacement) 04/01/2020   s/p TAVR with a 29 mm Edwards Sapien 3 THV via the TF approach by Drs Buena Irish & Roxy Manns   Severe aortic stenosis    Sleep apnea with use of continuous positive airway pressure (CPAP)    Tobacco abuse    Tremor    HEAD    Current Outpatient Medications:    albuterol (VENTOLIN HFA) 108 (90 Base) MCG/ACT inhaler, Inhale 2 puffs into the lungs every 6 (six) hours as needed for wheezing or shortness of breath., Disp: 54 g, Rfl: 1   aspirin EC 81 MG tablet, Take 81 mg by mouth daily., Disp: , Rfl:    atorvastatin (LIPITOR) 10 MG tablet, Take 1 tablet (10 mg total) by mouth daily., Disp: 30 tablet, Rfl: 2   BREZTRI AEROSPHERE 160-9-4.8 MCG/ACT AERO, INHALE 2 PUFFS  INTO THE LUNGS TWICE DAILY, Disp: 10.7 g, Rfl: 0   calcium-vitamin D (OSCAL WITH D) 250-125 MG-UNIT tablet, Take 1 tablet by mouth daily., Disp: , Rfl:    carvedilol (COREG) 6.25 MG tablet, Take 1 tablet (6.25 mg total) by mouth 2 (two) times daily with a meal., Disp: 180 tablet, Rfl: 3   cloNIDine (CATAPRES) 0.1 MG tablet, Take 1 tablet (0.1 mg total) by mouth 2 (two) times daily., Disp: 180 tablet, Rfl: 3   ezetimibe (ZETIA) 10 MG tablet, Take 1 tablet (10 mg total) by mouth daily., Disp: 90 tablet, Rfl: 3   fluticasone (FLONASE) 50 MCG/ACT nasal spray, , Disp: , Rfl:    gabapentin (NEURONTIN) 100 MG capsule, TAKE 1-2 CAPSULES BY MOUTH AT BEDTIME, Disp: 180 capsule, Rfl: 0   glucose blood (ACCU-CHEK GUIDE) test strip, Check blood sugar twice daily, Disp: 100 each, Rfl: 12   insulin glargine (LANTUS) 100 unit/mL SOPN, Inject 22 Units into the skin at bedtime., Disp: , Rfl:    ipratropium-albuterol (DUONEB) 0.5-2.5 (3) MG/3ML SOLN, Take 3 mLs by nebulization every 6 (six) hours as needed (Asthma)., Disp: 360 mL, Rfl: 12   ketoconazole (NIZORAL) 2 % cream, APPLY 1 APPLICATION TOPICALLY ONCE DAILY, Disp: 30 g, Rfl: 0   lisinopril (ZESTRIL) 5 MG tablet, Take 1 tablet (5  mg total) by mouth daily., Disp: 90 tablet, Rfl: 3   metFORMIN (GLUCOPHAGE) 500 MG tablet, Take 1,000 mg by mouth in the morning and at bedtime., Disp: , Rfl:    pantoprazole (PROTONIX) 40 MG tablet, Take 1 tablet (40 mg total) by mouth 2 (two) times daily., Disp: 60 tablet, Rfl: 11   sennosides-docusate sodium (SENOKOT-S) 8.6-50 MG tablet, Take 1 tablet by mouth in the morning and at bedtime., Disp: 180 tablet, Rfl: 0   torsemide (DEMADEX) 10 MG tablet, Take 1 tablet (10 mg total) by mouth daily., Disp: 30 tablet, Rfl: 6  Social History   Tobacco Use  Smoking Status Former   Packs/day: 4.00   Years: 68.00   Pack years: 272.00   Types: Cigarettes   Quit date: 10/04/2021   Years since quitting: 0.4  Smokeless Tobacco Former    Types: Chew, Snuff   Quit date: 2000  Tobacco Comments   1 cigs a day     Allergies  Allergen Reactions   Codeine Itching   Objective:  There were no vitals filed for this visit. There is no height or weight on file to calculate BMI. Constitutional Well developed. Well nourished.  Vascular Dorsalis pedis pulses faintly palpable bilaterally. Posterior tibial pulses faintly palpable bilaterally. Capillary refill normal to all digits.  No cyanosis or clubbing noted. Pedal hair growth normal.  Neurologic Normal speech. Oriented to person, place, and time. Protective sensation absent  Dermatologic Right submetatarsal 3 ulceration completely reepithelialized.  No clinical signs of ulceration noted.  Hyperkeratotic lesion noted.  No signs of infection noted.  Orthopedic: No pain to palpation either foot.   Radiographs: None Assessment:   1. Plantar flexed metatarsal bone of right foot   2. DM type 2 with diabetic peripheral neuropathy ([place])     Plan:  Patient was evaluated and treated and all questions answered.  Ulcer submetatarsal 3 ulceration with fat layer exposed with underlying plantarflexed third metatarsal -Clinically healed and the skin has reepithelialized.  However he does have plantarflexed metatarsal is putting excessive stress.  However is diabetic with last A1c of 10.9%.  Eventually he will benefit from a floating osteotomy however once his sugars has improved we will discuss revisiting the surgery.  Patient states understanding -He will work on glucose control  No follow-ups on file.

## 2022-03-03 NOTE — Congregational Nurse Program (Signed)
  Dept: [phone]   Congregational Nurse Program Note  Date of Encounter: 03/03/2022 Client to clinic for blood pressure check. BP (!) 152/78 (BP Location: Left Arm, Patient Position: Sitting, Cuff Size: Large)   Pulse (!) 113   SpO2 97%  No new health concerns at this time.   Past Medical History: Past Medical History:  Diagnosis Date   (HFpEF) heart failure with preserved ejection fraction ([place])    a. 08/2017 Echo: EF 60-65%, Gr1 DD.   Arthritis    COPD (chronic obstructive pulmonary disease) ([place])    Coronary artery disease 2021   Depression    Diabetes mellitus without complication (HCC)    Hypertension    Incidental pulmonary nodule, greater than or equal to 16mm 03/11/2020   Left lower lobe - suspicious for bronchogenic neoplasm   Lymphedema    Morbid obesity ([place])    Prostate cancer ([place]) 03/2013   ? PANCREATIC   S/P TAVR (transcatheter aortic valve replacement) 04/01/2020   s/p TAVR with a 29 mm Edwards Sapien 3 THV via the TF approach by Drs Buena Irish & Roxy Manns   Severe aortic stenosis    Sleep apnea with use of continuous positive airway pressure (CPAP)    Tobacco abuse    Tremor    HEAD    Encounter Details:  CNP Questionnaire - 03/03/22 1114       Questionnaire   Do you give verbal consent to treat you today? Yes    Location Patient [place] or Organization    Patient Status Unknown   client is in  temporary housing   Insurance Medicare    Insurance Referral N/A    Medication N/A    Medical Provider Yes    Screening Referrals N/A    Medical Referral N/A    Medical Appointment Made N/A   client reports he has a podiatry apt tomorrow 5/25   Food N/A    Transportation N/A   has his own vehicle   Housing/Utilities No permanent housing    Interpersonal Safety N/A    Intervention Blood glucose;Blood pressure;Support    ED Visit Averted N/A    Life-Saving Intervention Made N/A

## 2022-03-04 ENCOUNTER — Ambulatory Visit: Payer: Medicare Other | Admitting: Pulmonary Disease

## 2022-03-09 DIAGNOSIS — E1142 Type 2 diabetes mellitus with diabetic polyneuropathy: Secondary | ICD-10-CM

## 2022-03-09 LAB — GLUCOSE, POCT (MANUAL RESULT ENTRY): POC Glucose: 198 mg/dl — AB (ref 70–99)

## 2022-03-09 NOTE — Congregational Nurse Program (Signed)
  Dept: Agency Nurse Program Note  Date of Encounter: 03/09/2022 Client to center today for vital sign check and blood glucose monitoring. He still is unable to use his insulin as he is staying in a place w/o refrigeration/electricity.  BP (!) 158/78 (BP Location: Left Arm, Patient Position: Sitting, Cuff Size: Normal)   Pulse 86   SpO2 96% Comment: room air Blood sugar 198, nonfasting Client reports he is continuing to look for housing. He has an upcoming GI apt at [place] on 6/13  at 10:00.   Past Medical History: Past Medical History:  Diagnosis Date   (HFpEF) heart failure with preserved ejection fraction ([place])    a. 08/2017 Echo: EF 60-65%, Gr1 DD.   Arthritis    COPD (chronic obstructive pulmonary disease) ([place])    Coronary artery disease 2021   Depression    Diabetes mellitus without complication (HCC)    Hypertension    Incidental pulmonary nodule, greater than or equal to 39mm 03/11/2020   Left lower lobe - suspicious for bronchogenic neoplasm   Lymphedema    Morbid obesity ([place])    Prostate cancer ([place]) 03/2013   ? PANCREATIC   S/P TAVR (transcatheter aortic valve replacement) 04/01/2020   s/p TAVR with a 29 mm Edwards Sapien 3 THV via the TF approach by Drs Buena Irish & Roxy Manns   Severe aortic stenosis    Sleep apnea with use of continuous positive airway pressure (CPAP)    Tobacco abuse    Tremor    HEAD    Encounter Details:  CNP Questionnaire - 03/09/22 1017       Questionnaire   Do you give verbal consent to treat you today? 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 Currenlty staying in housing w/o heat/air or refrigeration   Interpersonal Safety N/A    Intervention Blood glucose;Blood pressure;Support    ED Visit Averted N/A    Life-Saving Intervention Made N/A

## 2022-03-15 ENCOUNTER — Other Ambulatory Visit: Payer: Self-pay | Admitting: Internal Medicine

## 2022-03-15 DIAGNOSIS — E1142 Type 2 diabetes mellitus with diabetic polyneuropathy: Secondary | ICD-10-CM

## 2022-03-15 DIAGNOSIS — K219 Gastro-esophageal reflux disease without esophagitis: Secondary | ICD-10-CM

## 2022-03-16 NOTE — Telephone Encounter (Signed)
Requested medication (s) are due for refill today: Yes  Requested medication (s) are on the active medication list: Yes  Last refill:  02/17/22  Future visit scheduled: No  Notes to clinic:  No protocol.    Requested Prescriptions  Pending Prescriptions Disp Refills   BREZTRI AEROSPHERE 160-9-4.8 MCG/ACT AERO [Pharmacy Med Name: BREZTRI AEROSPHERE 160-9-4.8 MCG/AC] 10.7 g 0    Sig: INHALE 2 PUFFS INTO THE LUNGS TWICE DAILY     Off-Protocol Failed - 03/15/2022  6:31 PM      Failed - Medication not assigned to a protocol, review manually.      Passed - Valid encounter within last 12 months    Recent Outpatient Visits           3 months ago DM type 2 with diabetic peripheral neuropathy ([place])   [place], DO   3 months ago Callus of foot   [facility] [place], Coralie Keens, NP   4 months ago Encounter for general adult medical examination with abnormal findings   [facility] [place], Coralie Keens, NP   7 months ago Blood in [facility] [place], Coralie Keens, NP   8 months ago Abdominal bloating   [facility] [place], Coralie Keens, NP              Signed Prescriptions Disp Refills   pantoprazole (PROTONIX) 40 MG tablet 60 tablet 3    Sig: TAKE 1 TABLET BY MOUTH TWICE DAILY     Gastroenterology: Proton Pump Inhibitors Passed - 03/15/2022  6:31 PM      Passed - Valid encounter within last 12 months    Recent Outpatient Visits           3 months ago DM type 2 with diabetic peripheral neuropathy [facility])   [facility] [place], Devonne Doughty, DO   3 months ago Callus of foot   [facility] [place], Coralie Keens, NP   4 months ago Encounter for general adult medical examination with abnormal findings   [facility] [place], Coralie Keens, NP   7 months ago Blood in [facility] [place], Coralie Keens, NP   8 months ago Abdominal  bloating   [facility] [place], [place] W, NP               gabapentin (NEURONTIN) 100 MG capsule 180 capsule 0    Sig: TAKE 1-2 CAPSULES BY MOUTH AT BEDTIME     Neurology: Anticonvulsants - gabapentin Failed - 03/15/2022  6:31 PM      Failed - Cr in normal range and within 360 days    Creat  Date Value Ref Range Status  11/13/2021 1.70 (H) 0.70 - 1.28 mg/dL Final   Creatinine, Ser  Date Value Ref Range Status  02/01/2022 1.50 (H) 0.61 - 1.24 mg/dL Final   Creatinine, Urine  Date Value Ref Range Status  11/13/2021 53 20 - 320 mg/dL Final         Passed - Completed PHQ-2 or PHQ-9 in the last 360 days      Passed - Valid encounter within last 12 months    Recent Outpatient Visits           3 months ago DM type 2 with diabetic peripheral neuropathy [facility])   [place], DO   3 months  ago Callus of foot   [place], Coralie Keens, NP   4 months ago Encounter for general adult medical examination with abnormal findings   [facility] [place], Coralie Keens, NP   7 months ago Blood in [place], NP   8 months ago Abdominal bloating   [facility] [place], Coralie Keens, [place]

## 2022-03-21 ENCOUNTER — Other Ambulatory Visit: Payer: Self-pay

## 2022-03-21 ENCOUNTER — Emergency Department
Admission: EM | Admit: 2022-03-21 | Discharge: 2022-03-23 | Disposition: A | Discharge status: Home / Self Care | Payer: Medicare Other | Attending: Emergency Medicine | Admitting: Emergency Medicine

## 2022-03-21 ENCOUNTER — Emergency Department: Payer: Medicare Other

## 2022-03-21 DIAGNOSIS — Z5900 Homelessness unspecified: Secondary | ICD-10-CM

## 2022-03-21 DIAGNOSIS — Z79899 Other long term (current) drug therapy: Secondary | ICD-10-CM | POA: Diagnosis not present

## 2022-03-21 DIAGNOSIS — I509 Heart failure, unspecified: Secondary | ICD-10-CM | POA: Insufficient documentation

## 2022-03-21 DIAGNOSIS — K5900 Constipation, unspecified: Secondary | ICD-10-CM | POA: Diagnosis not present

## 2022-03-21 DIAGNOSIS — R0600 Dyspnea, unspecified: Secondary | ICD-10-CM | POA: Diagnosis not present

## 2022-03-21 DIAGNOSIS — E1122 Type 2 diabetes mellitus with diabetic chronic kidney disease: Secondary | ICD-10-CM | POA: Insufficient documentation

## 2022-03-21 DIAGNOSIS — I130 Hypertensive heart and chronic kidney disease with heart failure and stage 1 through stage 4 chronic kidney disease, or unspecified chronic kidney disease: Secondary | ICD-10-CM | POA: Diagnosis not present

## 2022-03-21 DIAGNOSIS — I2510 Atherosclerotic heart disease of native coronary artery without angina pectoris: Secondary | ICD-10-CM | POA: Insufficient documentation

## 2022-03-21 DIAGNOSIS — Z7951 Long term (current) use of inhaled steroids: Secondary | ICD-10-CM | POA: Diagnosis not present

## 2022-03-21 DIAGNOSIS — R300 Dysuria: Secondary | ICD-10-CM | POA: Insufficient documentation

## 2022-03-21 DIAGNOSIS — Z794 Long term (current) use of insulin: Secondary | ICD-10-CM | POA: Insufficient documentation

## 2022-03-21 DIAGNOSIS — R0602 Shortness of breath: Secondary | ICD-10-CM

## 2022-03-21 DIAGNOSIS — Z7984 Long term (current) use of oral hypoglycemic drugs: Secondary | ICD-10-CM | POA: Diagnosis not present

## 2022-03-21 DIAGNOSIS — R000 Tachycardia, unspecified: Secondary | ICD-10-CM | POA: Diagnosis not present

## 2022-03-21 DIAGNOSIS — Z85118 Personal history of other malignant neoplasm of bronchus and lung: Secondary | ICD-10-CM | POA: Diagnosis not present

## 2022-03-21 DIAGNOSIS — R079 Chest pain, unspecified: Secondary | ICD-10-CM | POA: Diagnosis not present

## 2022-03-21 DIAGNOSIS — Z8546 Personal history of malignant neoplasm of prostate: Secondary | ICD-10-CM | POA: Insufficient documentation

## 2022-03-21 DIAGNOSIS — N189 Chronic kidney disease, unspecified: Secondary | ICD-10-CM | POA: Diagnosis not present

## 2022-03-21 DIAGNOSIS — J449 Chronic obstructive pulmonary disease, unspecified: Secondary | ICD-10-CM | POA: Diagnosis not present

## 2022-03-21 DIAGNOSIS — Z7982 Long term (current) use of aspirin: Secondary | ICD-10-CM | POA: Insufficient documentation

## 2022-03-21 LAB — COMPREHENSIVE METABOLIC PANEL
ALT: 39 U/L (ref 0–44)
AST: 36 U/L (ref 15–41)
Albumin: 3.8 g/dL (ref 3.5–5.0)
Alkaline Phosphatase: 153 U/L — ABNORMAL HIGH (ref 38–126)
Anion gap: 6 (ref 5–15)
BUN: 23 mg/dL (ref 8–23)
CO2: 26 mmol/L (ref 22–32)
Calcium: 9.4 mg/dL (ref 8.9–10.3)
Chloride: 107 mmol/L (ref 98–111)
Creatinine, Ser: 1.36 mg/dL — ABNORMAL HIGH (ref 0.61–1.24)
GFR, Estimated: 54 mL/min — ABNORMAL LOW (ref >60)
Glucose, Bld: 150 mg/dL — ABNORMAL HIGH (ref 70–99)
Potassium: 4.6 mmol/L (ref 3.5–5.1)
Sodium: 139 mmol/L (ref 135–145)
Total Bilirubin: 0.6 mg/dL (ref 0.3–1.2)
Total Protein: 8.3 g/dL — ABNORMAL HIGH (ref 6.5–8.1)

## 2022-03-21 LAB — CBC WITH DIFFERENTIAL/PLATELET
Abs Immature Granulocytes: 0.03 10*3/uL (ref 0.00–0.07)
Basophils Absolute: 0.1 10*3/uL (ref 0.0–0.1)
Basophils Relative: 1 %
Eosinophils Absolute: 0.3 10*3/uL (ref 0.0–0.5)
Eosinophils Relative: 3 %
HCT: 41 % (ref 39.0–52.0)
Hemoglobin: 12.9 g/dL — ABNORMAL LOW (ref 13.0–17.0)
Immature Granulocytes: 0 %
Lymphocytes Relative: 15 %
Lymphs Abs: 1.7 10*3/uL (ref 0.7–4.0)
MCH: 28.5 pg (ref 26.0–34.0)
MCHC: 31.5 g/dL (ref 30.0–36.0)
MCV: 90.5 fL (ref 80.0–100.0)
Monocytes Absolute: 0.7 10*3/uL (ref 0.1–1.0)
Monocytes Relative: 6 %
Neutro Abs: 8.5 10*3/uL — ABNORMAL HIGH (ref 1.7–7.7)
Neutrophils Relative %: 75 %
Platelets: 229 10*3/uL (ref 150–400)
RBC: 4.53 MIL/uL (ref 4.22–5.81)
RDW: 14.2 % (ref 11.5–15.5)
WBC: 11.3 10*3/uL — ABNORMAL HIGH (ref 4.0–10.5)
nRBC: 0 % (ref 0.0–0.2)

## 2022-03-21 LAB — URINALYSIS, ROUTINE W REFLEX MICROSCOPIC
Bacteria, UA: NONE SEEN
Bilirubin Urine: NEGATIVE
Glucose, UA: NEGATIVE mg/dL
Hgb urine dipstick: NEGATIVE
Ketones, ur: NEGATIVE mg/dL
Leukocytes,Ua: NEGATIVE
Nitrite: NEGATIVE
Protein, ur: 30 mg/dL — AB
Specific Gravity, Urine: 1.02 (ref 1.005–1.030)
pH: 6 (ref 5.0–8.0)

## 2022-03-21 LAB — TROPONIN I (HIGH SENSITIVITY)
Troponin I (High Sensitivity): 16 ng/L (ref <18)
Troponin I (High Sensitivity): 13 ng/L (ref <18)

## 2022-03-21 LAB — CBG MONITORING, ED: Glucose-Capillary: 152 mg/dL — ABNORMAL HIGH (ref 70–99)

## 2022-03-21 NOTE — ED Triage Notes (Signed)
Pt ambulatory to triage with walker with c/o tachycardia and shortness of breath at home with max HR 100. Also c/o umbilical abd pain and constipation, burning with urination  x 2 days.  Reports should be wearing home 02 at night and using insulin but is homeless and has been sleeping in cars and at churches x 2 weeks and has not had medications in quite some time.

## 2022-03-21 NOTE — ED Notes (Addendum)
Pt to ED for multiple complaints. Pt states he thinks he was close to having a heat stroke around a couple days ago. Pt having abdominal pain, SOB "when its hot out". Denies N/V/D. Pt states he has been constipated. Pt denies CP.  Pt currently wears 2L [place] chronically  Pt states he is currently homeless.

## 2022-03-21 NOTE — ED Provider Notes (Signed)
[facility] The Provider Note    Event Date/Time   First MD Initiated Contact with Patient 03/21/22 2302     (approximate)   History   Tachycardia   HPI  Nathaniel Little is a 76 y.o. male who presents to the ED with a chief complaint of tachycardia and shortness of breath, constipation, dysuria and homelessness.  Patient has been living out of his car for the past 2 weeks.  Has been able to take his medications except for insulin as he does not have access to a refrigerator.  He is supposed to wear oxygen at nighttime but has not been able to due to homelessness.  Patient has a medical history of COPD, CHF, CAD, insulin-dependent diabetes, hypertension.  No BM in 2 days.  Denies fever, chills, chest pain, nausea, vomiting or diarrhea.     Past Medical History   Past Medical History:  Diagnosis Date   (HFpEF) heart failure with preserved ejection fraction ([place])    a. 08/2017 Echo: EF 60-65%, Gr1 DD.   Arthritis    COPD (chronic obstructive pulmonary disease) ([place])    Coronary artery disease 2021   Depression    Diabetes mellitus without complication (HCC)    Hypertension    Incidental pulmonary nodule, greater than or equal to 101m 03/11/2020   Left lower lobe - suspicious for bronchogenic neoplasm   Lymphedema    Morbid obesity ([place]    Prostate cancer ([place] 03/2013   ? PANCREATIC   S/P TAVR (transcatheter aortic valve replacement) 04/01/2020   s/p TAVR with a 29 mm Edwards Sapien 3 THV via the TF approach by Drs MBuena Irish& ORoxy Manns  Severe aortic stenosis    Sleep apnea with use of continuous positive airway pressure (CPAP)    Tobacco abuse    Tremor    HEAD     Active Problem List   Patient Active Problem List   Diagnosis Date Noted   Chronic kidney disease 12/08/2021   Anemia 12/08/2021   Chronic constipation 11/13/2021   Urinary incontinence 11/13/2021   Chronic respiratory failure with hypoxia ([place] 06/26/2021   Major depressive disorder  with single episode, in partial remission ([place] 06/22/2021   Class 1 obesity due to excess calories with body mass index (BMI) of 30.0 to 30.9 in adult 05/22/2021   Iron deficiency anemia 01/20/2021   Insomnia 01/20/2021   Malignant neoplasm of lower lobe of left lung ([place] 12/14/2020   DM type 2 with diabetic peripheral neuropathy ([place] 06/13/2020   Tremor 06/13/2020   Erectile dysfunction due to arterial insufficiency 09/24/2019   Lumbar degenerative disc disease 06/25/2019   Severe aortic stenosis    Hyperlipidemia 07/20/2018   Claudication in peripheral vascular disease ([place] 08/22/2017   Aortic atherosclerosis ([place] 07/25/2017   Chronic obstructive pulmonary disease ([place] 01/07/2017   GERD (gastroesophageal reflux disease) 01/07/2017   Essential hypertension 01/07/2017   Sleep apnea with use of continuous positive airway pressure (CPAP) 01/06/2017     Past Surgical History   Past Surgical History:  Procedure Laterality Date   ADENOIDECTOMY     APPENDECTOMY     CARDIAC CATHETERIZATION     CATARACT EXTRACTION W/PHACO Left 11/28/2018   Procedure: CATARACT EXTRACTION PHACO AND INTRAOCULAR LENS PLACEMENT (IOC) LEFT, DIABETIC;  Surgeon: PBirder Robson MD;  Location: [facility] ORS;  Service: Ophthalmology;  Laterality: Left;  [place] 00:56 CDE 9.44 Fluid pack lot # [id number]   EUS N/A 10/13/2017   Procedure: FULL UPPER ENDOSCOPIC ULTRASOUND (  EUS) RADIAL;  Surgeon: Holly Bodily, MD;  Location: [facility] ENDOSCOPY;  Service: Gastroenterology;  Laterality: N/A;   FLEXIBLE BRONCHOSCOPY N/A 11/19/2020   Procedure: FLEXIBLE BRONCHOSCOPY;  Surgeon: Melrose Nakayama, MD;  Location: [place];  Service: Thoracic;  Laterality: N/A;   INTERCOSTAL NERVE BLOCK Left 11/17/2020   Procedure: INTERCOSTAL NERVE BLOCK;  Surgeon: Melrose Nakayama, MD;  Location: [place];  Service: Thoracic;  Laterality: Left;   NODE DISSECTION Left 11/17/2020   Procedure: NODE DISSECTION;  Surgeon: Melrose Nakayama,  MD;  Location: [place];  Service: Thoracic;  Laterality: Left;   PANCREATICODUODENECTOMY  07/23/2018   RIGHT/LEFT HEART CATH AND CORONARY ANGIOGRAPHY N/A 02/21/2020   Procedure: RIGHT/LEFT HEART CATH AND CORONARY ANGIOGRAPHY;  Surgeon: Minna Merritts, MD;  Location: [place] CV LAB;  Service: Cardiovascular;  Laterality: N/A;   TEE WITHOUT CARDIOVERSION N/A 04/01/2020   Procedure: TRANSESOPHAGEAL ECHOCARDIOGRAM (TEE);  Surgeon: Burnell Blanks, MD;  Location: [place] [facility] LAB;  Service: Open Heart Surgery;  Laterality: N/A;   TONSILLECTOMY     TRANSCATHETER AORTIC VALVE REPLACEMENT, TRANSFEMORAL N/A 04/01/2020   Procedure: TRANSCATHETER AORTIC VALVE REPLACEMENT, TRANSFEMORAL;  Surgeon: Burnell Blanks, MD;  Location: [place] [facility];  Service: Open Heart Surgery;  Laterality: N/A;   [place]       Home Medications   Prior to Admission medications   Medication Sig Start Date End Date Taking? Authorizing Provider  albuterol (VENTOLIN HFA) 108 (90 Base) MCG/ACT inhaler Inhale 2 puffs into the lungs every 6 (six) hours as needed for wheezing or shortness of breath. 12/11/21   Jearld Fenton, NP  aspirin EC 81 MG tablet Take 81 mg by mouth daily.    [provider]  atorvastatin (LIPITOR) 10 MG tablet Take 1 tablet (10 mg total) by mouth daily. 01/08/22   Minna Merritts, MD  BREZTRI AEROSPHERE 160-9-4.8 MCG/ACT AERO INHALE 2 PUFFS INTO THE LUNGS TWICE DAILY 03/17/22   Jearld Fenton, NP  calcium-vitamin D (OSCAL WITH D) 250-125 MG-UNIT tablet Take 1 tablet by mouth daily.    [provider]  carvedilol (COREG) 6.25 MG tablet Take 1 tablet (6.25 mg total) by mouth 2 (two) times daily with a meal. 01/08/22 01/03/23  Minna Merritts, MD  cloNIDine (CATAPRES) 0.1 MG tablet Take 1 tablet (0.1 mg total) by mouth 2 (two) times daily. 01/08/22   Minna Merritts, MD  ezetimibe (ZETIA) 10 MG tablet Take 1 tablet (10 mg total) by mouth daily. 01/08/22 01/03/23  Minna Merritts, MD  fluticasone Asencion Islam) 50 MCG/ACT nasal spray  08/13/21   [provider]  gabapentin (NEURONTIN) 100 MG capsule TAKE 1-2 CAPSULES BY MOUTH AT BEDTIME 03/16/22   Jearld Fenton, NP  glucose blood (ACCU-CHEK GUIDE) test strip Check blood sugar twice daily 08/10/21   Jearld Fenton, NP  insulin glargine (LANTUS) 100 unit/mL SOPN Inject 22 Units into the skin at bedtime.    [provider]  ipratropium-albuterol (DUONEB) 0.5-2.5 (3) MG/3ML SOLN Take 3 mLs by nebulization every 6 (six) hours as needed (Asthma). 01/06/21   Kathrine Haddock, NP  ketoconazole (NIZORAL) 2 % cream APPLY 1 APPLICATION TOPICALLY ONCE DAILY 12/10/21   Jearld Fenton, NP  lisinopril (ZESTRIL) 5 MG tablet Take 1 tablet (5 mg total) by mouth daily. 01/08/22   Minna Merritts, MD  metFORMIN (GLUCOPHAGE) 500 MG tablet Take 1,000 mg by mouth in the morning and at bedtime. 11/04/18   [provider]  pantoprazole (PROTONIX) 40 MG tablet TAKE 1 TABLET BY MOUTH TWICE DAILY 03/16/22   Jearld Fenton, NP  sennosides-docusate sodium (SENOKOT-S) 8.6-50 MG tablet Take 1 tablet by mouth in the morning and at bedtime. 11/13/21   Jearld Fenton, NP  torsemide (DEMADEX) 10 MG tablet Take 1 tablet (10 mg total) by mouth daily. 01/08/22   Minna Merritts, MD     Allergies  Codeine   Family History   Family History  Problem Relation Age of Onset   Breast cancer Mother    Cancer Father        Black Lung   Cerebral palsy Brother    Tuberculosis Paternal Uncle    Prostate cancer Neg Hx    Kidney cancer Neg Hx    Bladder Cancer Neg Hx      Physical Exam  Triage Vital Signs: ED Triage Vitals  Enc Vitals Group     BP 03/21/22 1937 (!) 142/83     Pulse Rate 03/21/22 1937 85     Resp 03/21/22 1937 19     Temp 03/21/22 1937 98.4 F (36.9 C)     Temp Source 03/21/22 1937 Oral     SpO2 03/21/22 1937 96 %     Weight 03/21/22 1940 200 lb (90.7 kg)     Height 03/21/22 1940 _0  (1.753 m)     Head  Circumference --      Peak Flow --      Pain Score 03/21/22 1939 6     Pain Loc --      Pain Edu? --      Excl. in [place]? --     Updated Vital Signs: BP 134/71 (BP Location: Right Arm)   Pulse 73   Temp 98.4 F (36.9 C) (Oral)   Resp (!) 22   Ht _1  (1.753 m)   Wt 90.7 kg   SpO2 100%   BMI 29.53 kg/m    General: Awake, no distress.  CV:  RRR.  Good peripheral perfusion.  Resp:  Normal effort.  CTA B. Abd:  Nontender.  No distention.  Other:  No vesicles.   ED Results / Procedures / Treatments  Labs (all labs ordered are listed, but only abnormal results are displayed) Labs Reviewed  CBC WITH DIFFERENTIAL/PLATELET - Abnormal; Notable for the following components:      Result Value   WBC 11.3 (*)    Hemoglobin 12.9 (*)    Neutro Abs 8.5 (*)    All other components within normal limits  COMPREHENSIVE METABOLIC PANEL - Abnormal; Notable for the following components:   Glucose, Bld 150 (*)    Creatinine, Ser 1.36 (*)    Total Protein 8.3 (*)    Alkaline Phosphatase 153 (*)    GFR, Estimated 54 (*)    All other components within normal limits  URINALYSIS, ROUTINE W REFLEX MICROSCOPIC - Abnormal; Notable for the following components:   Color, Urine YELLOW (*)    APPearance CLEAR (*)    Protein, ur 30 (*)    All other components within normal limits  CBG MONITORING, ED - Abnormal; Notable for the following components:   Glucose-Capillary 152 (*)    All other components within normal limits  TROPONIN I (HIGH SENSITIVITY)  TROPONIN I (HIGH SENSITIVITY)     EKG  ED ECG REPORT I, Thierry Dobosz J, the attending physician, personally viewed and interpreted this ECG.   Date: 03/22/2022  EKG Time: 1948  Rate:  96  Rhythm: normal sinus rhythm  Axis: Normal  Intervals:none  ST&T Change: Nonspecific    RADIOLOGY I have independently visualized and interpreted patient's chest x-ray and abdominal x-ray as well as noted the radiology interpretation:  Chest x-ray:  Stable small left pleural effusion  KUB: Moderate stool in colon  Official radiology report(s): DG Abdomen 1 View  Result Date: 03/21/2022 CLINICAL DATA:  Constipation. EXAM: ABDOMEN - 1 VIEW COMPARISON:  Abdominal radiograph dated 11/22/2020. FINDINGS: Moderate stool in the colon. No bowel dilatation or evidence of obstruction. No free air. Degenerative changes of the spine. No acute osseous pathology. IMPRESSION: Moderate stool in the colon. Electronically Signed   By: Anner Crete M.D.   On: 03/21/2022 23:49   DG Chest 2 View  Result Date: 03/21/2022 CLINICAL DATA:  Dyspnea, chest pain EXAM: CHEST - 2 VIEW COMPARISON:  10/24/2021 FINDINGS: Small left pleural effusion or pleural thickening with associated left-sided volume loss is unchanged. Lungs are otherwise clear. No pneumothorax. No pleural effusion on the right. Transcatheter aortic valve replacement has been performed. Cardiac size within normal limits. Pulmonary vascularity is normal. IMPRESSION: Stable small left pleural effusion or pleural thickening with associated left-sided volume loss. Electronically Signed   By: Fidela Salisbury M.D.   On: 03/21/2022 20:15     PROCEDURES:  Critical Care performed: No  .1-3 Lead EKG Interpretation  Performed by: [name], MD Authorized by: [name], MD     Interpretation: normal     ECG rate:  75   ECG rate assessment: normal     Rhythm: sinus rhythm     Ectopy: none     Conduction: normal   Comments:     Patient placed on cardiac monitor to evaluate for arrhythmias    MEDICATIONS ORDERED IN ED: Medications  lactulose (CHRONULAC) 10 GM/15ML solution 30 g (has no administration in time range)     IMPRESSION / MDM / ASSESSMENT AND PLAN / ED COURSE  I reviewed the triage vital signs and the nursing notes.                             76 year old male presenting with tachycardia, shortness of breath, constipation, homelessness. Differential includes, but is not limited  to, viral syndrome, bronchitis including COPD exacerbation, pneumonia, reactive airway disease including asthma, CHF including exacerbation with or without pulmonary/interstitial edema, pneumothorax, ACS, thoracic trauma, and pulmonary embolism.   I have identified this patient to have a potentially life-threatening condition; at the minimum acute exacerbation of a chronic condition.  I have personally reviewed patient's records and see that he had a Pharmacist, hospital program clinic visit on 03/09/2022.  The patient is on the cardiac monitor to evaluate for evidence of arrhythmia and/or significant heart rate changes.  Laboratory results demonstrate improved creatinine from prior, 2 sets of negative troponins, negative UA.  Chest x-ray negative for infectious etiology.  KUB positive for moderate stool burden.  Will administer lactulose for constipation.  Given patient is homeless, will plan for [facility] boarding as patient needs oxygen at night and insulin.  Will have pharmacy tech reconcile patient's medications.      FINAL CLINICAL IMPRESSION(S) / ED DIAGNOSES   Final diagnoses:  Tachycardia  SOB (shortness of breath)  Constipation, unspecified constipation type  Homelessness     Rx / DC Orders   ED Discharge Orders     None  Note:  This document was prepared using Dragon voice recognition software and may include unintentional dictation errors.   [name], MD 03/22/22 [phone]

## 2022-03-22 ENCOUNTER — Telehealth: Payer: Medicare Other

## 2022-03-22 ENCOUNTER — Telehealth: Payer: Self-pay

## 2022-03-22 MED ORDER — ATORVASTATIN CALCIUM 20 MG PO TABS
10.0000 mg | ORAL_TABLET | Freq: Every day | ORAL | Status: DC
  Administered 2022-03-22 – 2022-03-23 (×2): 10 mg via ORAL
  Filled 2022-03-22 (×2): qty 1

## 2022-03-22 MED ORDER — CARVEDILOL 6.25 MG PO TABS
6.2500 mg | ORAL_TABLET | Freq: Two times a day (BID) | ORAL | Status: DC
  Administered 2022-03-22 – 2022-03-23 (×2): 6.25 mg via ORAL
  Filled 2022-03-22 (×2): qty 1

## 2022-03-22 MED ORDER — CLONIDINE HCL 0.1 MG PO TABS
0.1000 mg | ORAL_TABLET | Freq: Two times a day (BID) | ORAL | Status: DC
  Administered 2022-03-22 – 2022-03-23 (×3): 0.1 mg via ORAL
  Filled 2022-03-22 (×3): qty 1

## 2022-03-22 MED ORDER — TORSEMIDE 20 MG PO TABS
10.0000 mg | ORAL_TABLET | Freq: Every day | ORAL | Status: DC
  Administered 2022-03-22 – 2022-03-23 (×2): 10 mg via ORAL
  Filled 2022-03-22 (×2): qty 1

## 2022-03-22 MED ORDER — EZETIMIBE 10 MG PO TABS
10.0000 mg | ORAL_TABLET | Freq: Every day | ORAL | Status: DC
  Administered 2022-03-22 – 2022-03-23 (×2): 10 mg via ORAL
  Filled 2022-03-22 (×2): qty 1

## 2022-03-22 MED ORDER — LISINOPRIL 5 MG PO TABS
5.0000 mg | ORAL_TABLET | Freq: Every day | ORAL | Status: DC
  Administered 2022-03-22 – 2022-03-23 (×2): 5 mg via ORAL
  Filled 2022-03-22 (×2): qty 1

## 2022-03-22 MED ORDER — LACTULOSE 10 GM/15ML PO SOLN
30.0000 g | Freq: Once | ORAL | Status: AC
  Administered 2022-03-22: 30 g via ORAL
  Filled 2022-03-22: qty 60

## 2022-03-22 MED ORDER — ALBUTEROL SULFATE (2.5 MG/3ML) 0.083% IN NEBU: 2.5000 mg | INHALATION_SOLUTION | Freq: Four times a day (QID) | RESPIRATORY_TRACT | Status: DC | PRN

## 2022-03-22 MED ORDER — GABAPENTIN 100 MG PO CAPS: 100.0000 mg | ORAL_CAPSULE | Freq: Every evening | ORAL | Status: DC | PRN

## 2022-03-22 MED ORDER — METFORMIN HCL 500 MG PO TABS
1000.0000 mg | ORAL_TABLET | Freq: Two times a day (BID) | ORAL | Status: DC
  Administered 2022-03-22 – 2022-03-23 (×2): 1000 mg via ORAL
  Filled 2022-03-22 (×2): qty 2

## 2022-03-22 MED ORDER — PANTOPRAZOLE SODIUM 40 MG PO TBEC
40.0000 mg | DELAYED_RELEASE_TABLET | Freq: Two times a day (BID) | ORAL | Status: DC
  Administered 2022-03-22 – 2022-03-23 (×3): 40 mg via ORAL
  Filled 2022-03-22 (×3): qty 1

## 2022-03-22 NOTE — ED Notes (Signed)
Patient is resting quietly. Chest rise and fall observed. No needs at this time. Call bell within reach.

## 2022-03-22 NOTE — ED Notes (Signed)
Patient given TV remote

## 2022-03-22 NOTE — ED Notes (Signed)
Patient is resting in bed. No needs at this time.

## 2022-03-22 NOTE — ED Notes (Signed)
Patient is asleep.  

## 2022-03-22 NOTE — ED Notes (Signed)
Patient provided graham crackers and ginger ale

## 2022-03-22 NOTE — ED Notes (Signed)
Report received from [place], [place]

## 2022-03-22 NOTE — ED Notes (Signed)
Patient up to restroom with walker. Denies BM

## 2022-03-22 NOTE — Telephone Encounter (Signed)
  Care Management   Follow Up Note   03/22/2022 Name: Nathaniel Little MRN: [id number] DOB: 1946-02-24   Referred by: Jearld Fenton, NP Reason for referral : Chronic Care Management (RNCM: Follow up for Chronic Disease Management and Care Coordination Needs)   An unsuccessful telephone outreach was attempted today. The patient was referred to the case management team for assistance with care management and care coordination.   Follow Up Plan: The care management team will reach out to the patient again over the next 30 days.   Noreene Larsson RN, MSN, [place] Mobile: [phone]

## 2022-03-22 NOTE — ED Notes (Signed)
Patient was repositioned in bed and provided a snack tray.

## 2022-03-22 NOTE — TOC Initial Note (Signed)
Transition of Care [facility]) - Initial/Assessment Note    Patient Details  Name: Nathaniel Little MRN: [id number] Date of Birth: 18-Apr-1946  Transition of Care [facility]) CM/SW Contact:    Nathaniel Hutching, RN Phone Number: 03/22/2022, 2:58 PM  Clinical Narrative:                 Patient came into the emergency room for tachycardia and shortness of breath.  Patient has a history of Type 2 diabtes and lung cancer.  Patient is supposed to be on oxygen but since getting kicked out of [facility] he could not carry his concentrator with him.  Patient has been living with a friend but that friend had no power, he has been staying in his car for the past 2 weeks.  He has been going to the [facility] program and reports that he has case managers outpatient working on trying to find him housing.  He gets $1350 /month, he has [employer] and Medicaid. Patient walks with a rollator, he only has one shoe the other on he reports fell apart.  Patient is current with his PCP, he gets prescriptions from [facility], his insurance covers his medications but he does not have refrigeration for his insulin so he has not been taking it.    Reached out to [employer] to see if they could get a portable oxygen concentrator that he could use in his car, they are not sure that they can provide due to no stable housing.    Received return call from [employer] that they are not able to provide oxygen concentrator.  RNCM will attempt to get oxygen from another company.  Spoke with patient about finding maybe a group home or ALF and he prefers for the case workers that he has been working with to continue to work on housing.  Informed patient that he could not stay here until a place was found, he verbalizes understanding.  RNCM provided patient with new clothes and a new pair of shoes, he requests to take a bath will inform ED RN.  Patient has his car and he can stay in that until he finds a permanent place.   RNCM will try to  find oxygen before discharging but if unable to do so tomorrow patient aware he will discharge without it.    Expected Discharge Plan: [place]     Patient Goals and CMS Choice Patient states their goals for this hospitalization and ongoing recovery are:: patient is working with outpatient case managers and [place] to find housing      Expected Discharge Plan and Services Expected Discharge Plan: Homeless Shelter   Discharge Planning Services: CM Consult   Living arrangements for the past 2 months: Homeless                 DME Arranged: N/A DME Agency: NA       HH Arranged: NA HH Agency: NA        Prior Living Arrangements/Services Living arrangements for the past 2 months: Homeless Lives with:: Self Patient language and need for interpreter reviewed:: Yes Do you feel safe going back to the place where you live?: Yes      Need for Family Participation in Patient Care: Yes (Comment) Care giver support system in place?: No (comment) Current home services: DME (rollator) Criminal Activity/Legal Involvement Pertinent to Current Situation/Hospitalization: No - Comment as needed  Activities of Daily Living      Permission Sought/Granted Permission  sought to share information with : Family Supports Permission granted to share information with : Yes, Verbal Permission Granted  Share Information with NAME: Nathaniel Little     Permission granted to share info w Relationship: very good male friend- like a daughter to him  Permission granted to share info w Contact Information: [phone]  Emotional Assessment Appearance:: Appears stated age Attitude/Demeanor/Rapport: Engaged Affect (typically observed): Accepting Orientation: : Oriented to Self, Oriented to Place, Oriented to  Time, Oriented to Situation Alcohol / Substance Use: Not Applicable Psych Involvement: No (comment)  Admission diagnosis:  mult med complaints Patient Active Problem List    Diagnosis Date Noted   Chronic kidney disease 12/08/2021   Anemia 12/08/2021   Chronic constipation 11/13/2021   Urinary incontinence 11/13/2021   Chronic respiratory failure with hypoxia ([place]) 06/26/2021   Major depressive disorder with single episode, in partial remission ([place]) 06/22/2021   Class 1 obesity due to excess calories with body mass index (BMI) of 30.0 to 30.9 in adult 05/22/2021   Iron deficiency anemia 01/20/2021   Insomnia 01/20/2021   Malignant neoplasm of lower lobe of left lung ([place]) 12/14/2020   DM type 2 with diabetic peripheral neuropathy ([place]) 06/13/2020   Tremor 06/13/2020   Erectile dysfunction due to arterial insufficiency 09/24/2019   Lumbar degenerative disc disease 06/25/2019   Severe aortic stenosis    Hyperlipidemia 07/20/2018   Claudication in peripheral vascular disease ([place]) 08/22/2017   Aortic atherosclerosis ([place]) 07/25/2017   Chronic obstructive pulmonary disease ([place]) 01/07/2017   GERD (gastroesophageal reflux disease) 01/07/2017   Essential hypertension 01/07/2017   Sleep apnea with use of continuous positive airway pressure (CPAP) 01/06/2017   PCP:  Jearld Fenton, NP Pharmacy:   [place], [address] [place] [address] Phone: [phone] Fax: [place], [place]. [place] [address] Phone: [phone] Fax: [phone]     Social Determinants of Health (SDOH) Interventions    Readmission Risk Interventions     No data to display

## 2022-03-22 NOTE — ED Notes (Signed)
Patient given breakfast box tray,graham crackers, apple juice, and water

## 2022-03-23 DIAGNOSIS — J449 Chronic obstructive pulmonary disease, unspecified: Secondary | ICD-10-CM | POA: Diagnosis not present

## 2022-03-23 DIAGNOSIS — I5030 Unspecified diastolic (congestive) heart failure: Secondary | ICD-10-CM | POA: Diagnosis not present

## 2022-03-23 NOTE — ED Notes (Signed)
Pt is sleeping, chest rise and fall observed. No needs at this time. NAD noted

## 2022-03-23 NOTE — TOC Transition Note (Signed)
Transition of Care [facility]) - CM/SW Discharge Note   Patient Details  Name: Corben Auzenne MRN: [id number] Date of Birth: 1946-04-28  [facility] CM/SW Contact:  Shelbie Hutching, RN Phone Number: 03/23/2022, 10:09 AM   Clinical Narrative:    At this time TOC is unable to get patient portable oxygen without having stable housing.  Patient is doing well without any oxygen and has been without it for about a month.  RNCM provided patient with new clothes and shoes.  He has his vehicle which he will stay in at night.  Patient asking about day centers where he might be able to go during the day, told him to see about the [facility] and he has already been going to [facility].  Patient will be following up with the workers at [facility] as they are trying to find him housing he also has chronic care management that will be following up with him from [facility].    Final next level of care: Homeless Shelter Barriers to Discharge: Barriers Resolved   Patient Goals and CMS Choice Patient states their goals for this hospitalization and ongoing recovery are:: patient is working with outpatient case managers and [place] to find housing      Discharge Placement                       Discharge Plan and Services   Discharge Planning Services: CM Consult            DME Arranged: N/A DME Agency: NA       HH Arranged: NA HH Agency: NA        Social Determinants of Health (SDOH) Interventions     Readmission Risk Interventions     No data to display

## 2022-03-23 NOTE — ED Notes (Signed)
Pt continues to sleep. Respirations are even and unlabored. No needs at this time

## 2022-03-23 NOTE — ED Provider Notes (Signed)
-----------------------------------------   6:41 AM on 03/23/2022 -----------------------------------------   Blood pressure (!) 163/80, pulse 75, temperature 98.2 F (36.8 C), temperature source Oral, resp. rate 19, height 5\' 9"  (1.753 m), weight 90.7 kg, SpO2 97 %.  The patient is calm and cooperative at this time.  There have been no acute events since the last update.  Awaiting disposition plan from Social Work team.   [name], MD 03/23/22 [phone]

## 2022-03-23 NOTE — Discharge Instructions (Signed)
Please follow-up with your primary care doctor and your social worker.  I would recommend the shelter instead of being homeless but that of course is your choice.  Please do not hesitate to return if you need to.  Take all your medicines.

## 2022-03-24 ENCOUNTER — Telehealth: Payer: Self-pay

## 2022-03-24 NOTE — Chronic Care Management (AMB) (Unsigned)
  Chronic Care Management Note  03/24/2022 Name: Nathaniel Little MRN: [id number] DOB: [birth year]  Nathaniel Little is a 76 y.o. year old male who is a primary care patient of Garnette Gunner, Coralie Keens, NP and is actively engaged with the care management team. I reached out to [name] by phone today to assist with scheduling a follow up visit with the Licensed Clinical Social Worker  Follow up plan: Unsuccessful telephone outreach attempt made. A HIPAA compliant phone message was left for the patient providing contact information and requesting a return call.  The care management team will reach out to the patient again over the next 7 days.  If patient returns call to provider office, please advise to call [facility]  at [place], [address], [place],  [address] Direct Dial: [phone] [email or web address]: [email or web address]

## 2022-03-25 DIAGNOSIS — I5030 Unspecified diastolic (congestive) heart failure: Secondary | ICD-10-CM | POA: Diagnosis not present

## 2022-03-25 DIAGNOSIS — J449 Chronic obstructive pulmonary disease, unspecified: Secondary | ICD-10-CM | POA: Diagnosis not present

## 2022-03-25 NOTE — Congregational Nurse Program (Signed)
  Dept: [facility] Nurse Program Note  Date of Encounter: 03/25/2022 Client to clinic for assistance with some mail he received and also for vitral sign check. BP (!) 142/78 (BP Location: Left Arm, Patient Position: Sitting, Cuff Size: Normal)   Pulse (!) 106   Wt 185 lb (83.9 kg)   SpO2 94%   BMI 27.32 kg/m  Client continues living in his car. He reports he has a friend that will be looking for  a place for both of them. He does have an upcoming PCP apt on 6/28. He will continue to check in at [facility] for vital sign checks and support.  Past Medical History: Past Medical History:  Diagnosis Date   (HFpEF) heart failure with preserved ejection fraction ([place])    a. 08/2017 Echo: EF 60-65%, Gr1 DD.   Arthritis    COPD (chronic obstructive pulmonary disease) ([place])    Coronary artery disease 2021   Depression    Diabetes mellitus without complication (HCC)    Hypertension    Incidental pulmonary nodule, greater than or equal to 54mm 03/11/2020   Left lower lobe - suspicious for bronchogenic neoplasm   Lymphedema    Morbid obesity ([place])    Prostate cancer ([place]) 03/2013   ? PANCREATIC   S/P TAVR (transcatheter aortic valve replacement) 04/01/2020   s/p TAVR with a 29 mm Edwards Sapien 3 THV via the TF approach by Drs Buena Irish & Roxy Manns   Severe aortic stenosis    Sleep apnea with use of continuous positive airway pressure (CPAP)    Tobacco abuse    Tremor    HEAD    Encounter Details:  CNP Questionnaire - 03/25/22 0920       Questionnaire   Do you give verbal consent to treat you today? Yes    Location Patient [place] or Organization    Patient Status Homeless   client is currenlty living in his car   Insurance Medicare;Medicaid    Insurance Referral N/A    Medication N/A    Medical Provider Yes   client reports he has a PCP apt on 6/28@8 :58 am   Screening Referrals N/A    Medical Referral N/A    Medical  Appointment Made N/A   confirmed apt at [place] for 6/13 at 30 am   [place] Insecurities   client is currenlty living in his car, does have food stamps   Transportation N/A   has his own vehicle   Housing/Utilities No permanent housing   client is working on housing with [employer] case Freight forwarder. Currenlty staying in housing w/o heat/air or refrigeration   Interpersonal Safety N/A    Intervention Blood glucose;Blood pressure;Support    ED Visit Averted N/A    Life-Saving Intervention Made N/A

## 2022-03-29 ENCOUNTER — Telehealth: Payer: Self-pay

## 2022-03-30 ENCOUNTER — Ambulatory Visit (INDEPENDENT_AMBULATORY_CARE_PROVIDER_SITE_OTHER): Payer: Medicare Other

## 2022-03-30 ENCOUNTER — Ambulatory Visit: Payer: Medicare Other | Admitting: Licensed Clinical Social Worker

## 2022-03-30 DIAGNOSIS — J449 Chronic obstructive pulmonary disease, unspecified: Secondary | ICD-10-CM

## 2022-03-30 DIAGNOSIS — F324 Major depressive disorder, single episode, in partial remission: Secondary | ICD-10-CM

## 2022-03-30 DIAGNOSIS — E782 Mixed hyperlipidemia: Secondary | ICD-10-CM

## 2022-03-30 DIAGNOSIS — E1142 Type 2 diabetes mellitus with diabetic polyneuropathy: Secondary | ICD-10-CM

## 2022-03-30 DIAGNOSIS — I10 Essential (primary) hypertension: Secondary | ICD-10-CM

## 2022-03-30 DIAGNOSIS — J431 Panlobular emphysema: Secondary | ICD-10-CM

## 2022-03-30 DIAGNOSIS — M25551 Pain in right hip: Secondary | ICD-10-CM

## 2022-03-30 DIAGNOSIS — I5032 Chronic diastolic (congestive) heart failure: Secondary | ICD-10-CM

## 2022-03-30 DIAGNOSIS — J418 Mixed simple and mucopurulent chronic bronchitis: Secondary | ICD-10-CM

## 2022-03-30 DIAGNOSIS — N1832 Chronic kidney disease, stage 3b: Secondary | ICD-10-CM

## 2022-03-30 DIAGNOSIS — Z5900 Homelessness unspecified: Secondary | ICD-10-CM

## 2022-03-31 ENCOUNTER — Ambulatory Visit (INDEPENDENT_AMBULATORY_CARE_PROVIDER_SITE_OTHER): Payer: Medicare Other | Admitting: Internal Medicine

## 2022-03-31 ENCOUNTER — Encounter: Payer: Self-pay | Admitting: Internal Medicine

## 2022-03-31 VITALS — BP 126/74 | HR 105 | Temp 96.9°F | Wt 183.0 lb

## 2022-03-31 DIAGNOSIS — J9611 Chronic respiratory failure with hypoxia: Secondary | ICD-10-CM

## 2022-03-31 DIAGNOSIS — Z6827 Body mass index (BMI) 27.0-27.9, adult: Secondary | ICD-10-CM

## 2022-03-31 DIAGNOSIS — M5136 Other intervertebral disc degeneration, lumbar region: Secondary | ICD-10-CM

## 2022-03-31 DIAGNOSIS — D631 Anemia in chronic kidney disease: Secondary | ICD-10-CM

## 2022-03-31 DIAGNOSIS — I10 Essential (primary) hypertension: Secondary | ICD-10-CM | POA: Diagnosis not present

## 2022-03-31 DIAGNOSIS — I700 Atherosclerosis of aorta: Secondary | ICD-10-CM

## 2022-03-31 DIAGNOSIS — I509 Heart failure, unspecified: Secondary | ICD-10-CM

## 2022-03-31 DIAGNOSIS — D692 Other nonthrombocytopenic purpura: Secondary | ICD-10-CM

## 2022-03-31 DIAGNOSIS — N5201 Erectile dysfunction due to arterial insufficiency: Secondary | ICD-10-CM | POA: Diagnosis not present

## 2022-03-31 DIAGNOSIS — J418 Mixed simple and mucopurulent chronic bronchitis: Secondary | ICD-10-CM

## 2022-03-31 DIAGNOSIS — I350 Nonrheumatic aortic (valve) stenosis: Secondary | ICD-10-CM

## 2022-03-31 DIAGNOSIS — K219 Gastro-esophageal reflux disease without esophagitis: Secondary | ICD-10-CM | POA: Diagnosis not present

## 2022-03-31 DIAGNOSIS — G4730 Sleep apnea, unspecified: Secondary | ICD-10-CM | POA: Diagnosis not present

## 2022-03-31 DIAGNOSIS — R251 Tremor, unspecified: Secondary | ICD-10-CM

## 2022-03-31 DIAGNOSIS — N1831 Chronic kidney disease, stage 3a: Secondary | ICD-10-CM | POA: Diagnosis not present

## 2022-03-31 DIAGNOSIS — E1142 Type 2 diabetes mellitus with diabetic polyneuropathy: Secondary | ICD-10-CM

## 2022-03-31 DIAGNOSIS — F5101 Primary insomnia: Secondary | ICD-10-CM

## 2022-03-31 DIAGNOSIS — E663 Overweight: Secondary | ICD-10-CM

## 2022-03-31 HISTORY — DX: Heart failure, unspecified: I50.9

## 2022-03-31 HISTORY — DX: Other nonthrombocytopenic purpura: D69.2

## 2022-03-31 LAB — LIPID PANEL
Cholesterol: 131 mg/dL (ref <200)
HDL: 43 mg/dL (ref >=40)
LDL Cholesterol (Calc): 65 mg/dL (calc)
Non-HDL Cholesterol (Calc): 88 mg/dL (calc) (ref <130)
Total CHOL/HDL Ratio: 3 (calc) (ref <5.0)
Triglycerides: 148 mg/dL (ref <150)

## 2022-03-31 LAB — POCT GLYCOSYLATED HEMOGLOBIN (HGB A1C): Hemoglobin A1C: 8.2 % — AB (ref 4.0–5.6)

## 2022-03-31 MED ORDER — GLIPIZIDE 5 MG PO TABS: 5.0000 mg | ORAL_TABLET | Freq: Two times a day (BID) | ORAL | 0 refills | Status: DC

## 2022-03-31 MED ORDER — BREZTRI AEROSPHERE 160-9-4.8 MCG/ACT IN AERO: 2.0000 | INHALATION_SPRAY | Freq: Two times a day (BID) | RESPIRATORY_TRACT | 2 refills | Status: AC

## 2022-03-31 NOTE — Assessment & Plan Note (Signed)
Continue lisinopril, carvedilol and torsemide Reinforced DASH diet Monitor weights

## 2022-03-31 NOTE — Assessment & Plan Note (Signed)
He is not currently taking any medications for this We will monitor

## 2022-03-31 NOTE — Assessment & Plan Note (Signed)
Encourage diet and exercise for weight loss 

## 2022-03-31 NOTE — Assessment & Plan Note (Signed)
C-Met reviewed Continue lisinopril for renal protection

## 2022-03-31 NOTE — Assessment & Plan Note (Signed)
Encourage weight loss as this can help reduce reflux symptoms Avoid foods that trigger reflux Continue pantoprazole

## 2022-03-31 NOTE — Progress Notes (Signed)
Subjective:    Patient ID: Nathaniel Little, male    DOB: 1946/02/25, 76 y.o.   MRN: [id number]  HPI  Patient presents to clinic today for follow-up of chronic conditions.  HTN: His BP today is 126/74.  He is taking Lisinopril, Clonidine and Carvedilol as prescribed.  ECG from 03/2022 reviewed.  DM2 with Neuropathy: His last A1c was 10.9%, 11/2021.  He is taking Metformin as prescribed.  He takes Gabapentin for neuropathic pain and is on Lisinopril for renal protection.  He is not checking his blood sugars. He checks his feet routinely.  His last eye exam was 08/2021.  Flu 06/2021.  Pneumovax 05/2021.  Prevnar 06/2018.  COVID x4.  CHF: He intermittently reports chronic cough and shortness of breath but denies lower extremity edema.  He is taking Clonidine, Lisinopril, Carvedilol and Torsemide as prescribed.  Echo from 03/2021 reviewed.  He follows with cardiology.  COPD with CH[facility]: He reports chronic cough and shortness of breath.  He is not wearing his oxygen because he is currently homelessness.  He is taking Symbicort and Albuterol as prescribed.  There are no PFTs on file.  HLD with PVD with Aortic Atherosclerosis: His last LDL was 87, triglycerides 258, 11/2021.  He is not taking Atorvastatin but is taking Ezetimibe.  He does not consume a low-fat diet.  ED: He is not currently taking any medications for this.  He does not follow with urology.  GERD: He has no idea what triggers this.  He denies breakthrough on Pantoprazole.  There is no upper GI on file.  OSA: He averages 7 hours of sleep per night without the use of his CPAP.  There is no sleep study on file.  Insomnia: He reports difficulty falling and staying asleep.  He is not taking any medications for sleep at this time.  There is no sleep study on file.  Iron Deficiency Anemia: His last H/H was 12.9/1, 03/2022.  He is not taking any oral iron at this time.  He does not follow with hematology.  OA: Mainly in his back.  He takes Tylenol  and Gabapentin as prescribed with some relief of symptoms.  He follows with orthopedics.  Tremor: Mainly in his head and hands.  He is not taking any medications for this at this time.  He does not follow with neurology.  CKD: His last creatinine was 1.36, GFR 54, 03/2022.  He is on Lisinopril for renal protection.  He does not follow with nephrology.   Review of Systems     Past Medical History:  Diagnosis Date   (HFpEF) heart failure with preserved ejection fraction ([place])    a. 08/2017 Echo: EF 60-65%, Gr1 DD.   Arthritis    COPD (chronic obstructive pulmonary disease) ([place])    Coronary artery disease 2021   Depression    Diabetes mellitus without complication (HCC)    Hypertension    Incidental pulmonary nodule, greater than or equal to 58mm 03/11/2020   Left lower lobe - suspicious for bronchogenic neoplasm   Lymphedema    Morbid obesity ([place])    Prostate cancer ([place]) 03/2013   ? PANCREATIC   S/P TAVR (transcatheter aortic valve replacement) 04/01/2020   s/p TAVR with a 29 mm Edwards Sapien 3 THV via the TF approach by Drs Buena Irish & Roxy Manns   Severe aortic stenosis    Sleep apnea with use of continuous positive airway pressure (CPAP)    Tobacco abuse    Tremor  HEAD    Current Outpatient Medications  Medication Sig Dispense Refill   albuterol (VENTOLIN HFA) 108 (90 Base) MCG/ACT inhaler Inhale 2 puffs into the lungs every 6 (six) hours as needed for wheezing or shortness of breath. 54 g 1   aspirin EC 81 MG tablet Take 81 mg by mouth daily.     atorvastatin (LIPITOR) 10 MG tablet Take 1 tablet (10 mg total) by mouth daily. 30 tablet 2   BREZTRI AEROSPHERE 160-9-4.8 MCG/ACT AERO INHALE 2 PUFFS INTO THE LUNGS TWICE DAILY 10.7 g 0   calcium-vitamin D (OSCAL WITH D) 250-125 MG-UNIT tablet Take 1 tablet by mouth daily.     carvedilol (COREG) 6.25 MG tablet Take 1 tablet (6.25 mg total) by mouth 2 (two) times daily with a meal. 180 tablet 3   cloNIDine (CATAPRES) 0.1 MG tablet  Take 1 tablet (0.1 mg total) by mouth 2 (two) times daily. 180 tablet 3   ezetimibe (ZETIA) 10 MG tablet Take 1 tablet (10 mg total) by mouth daily. 90 tablet 3   fluticasone (FLONASE) 50 MCG/ACT nasal spray      gabapentin (NEURONTIN) 100 MG capsule TAKE 1-2 CAPSULES BY MOUTH AT BEDTIME 180 capsule 0   glucose blood (ACCU-CHEK GUIDE) test strip Check blood sugar twice daily 100 each 12   insulin glargine (LANTUS) 100 unit/mL SOPN Inject 22 Units into the skin at bedtime. (Patient not taking: Reported on 03/22/2022)     ipratropium-albuterol (DUONEB) 0.5-2.5 (3) MG/3ML SOLN Take 3 mLs by nebulization every 6 (six) hours as needed (Asthma). 360 mL 12   ketoconazole (NIZORAL) 2 % cream APPLY 1 APPLICATION TOPICALLY ONCE DAILY (Patient not taking: Reported on 03/22/2022) 30 g 0   lisinopril (ZESTRIL) 5 MG tablet Take 1 tablet (5 mg total) by mouth daily. 90 tablet 3   metFORMIN (GLUCOPHAGE) 500 MG tablet Take 1,000 mg by mouth in the morning and at bedtime.     pantoprazole (PROTONIX) 40 MG tablet TAKE 1 TABLET BY MOUTH TWICE DAILY 60 tablet 3   potassium chloride (KLOR-CON) 10 MEQ tablet Take 10 mEq by mouth daily.     sennosides-docusate sodium (SENOKOT-S) 8.6-50 MG tablet Take 1 tablet by mouth in the morning and at bedtime. 180 tablet 0   torsemide (DEMADEX) 10 MG tablet Take 1 tablet (10 mg total) by mouth daily. 30 tablet 6   No current facility-administered medications for this visit.    Allergies  Allergen Reactions   Codeine Itching    Family History  Problem Relation Age of Onset   Breast cancer Mother    Cancer Father        Black Lung   Cerebral palsy Brother    Tuberculosis Paternal Uncle    Prostate cancer Neg Hx    Kidney cancer Neg Hx    Bladder Cancer Neg Hx     Social History   Socioeconomic History   Marital status: Widowed    Spouse name: Not on file   Number of children: 2   Years of education: Not on file   Highest education level: Not on file  Occupational  History   Occupation: retired -Patent attorney  Tobacco Use   Smoking status: Former    Packs/day: 4.00    Years: 68.00    Total pack years: 272.00    Types: Cigarettes    Quit date: 10/04/2021    Years since quitting: 0.4   Smokeless tobacco: Former    Types: Chew, Snuff  Quit date: 2000   Tobacco comments:    1 cigs a day   Vaping Use   Vaping Use: Never used  Substance and Sexual Activity   Alcohol use: Not Currently    Alcohol/week: 0.0 - 1.0 standard drinks of alcohol    Comment: quit 40years ago   Drug use: No   Sexual activity: Not Currently  Other Topics Concern   Not on file  Social History Narrative   Living at [place] Strain: Low Risk  (12/17/2021)   Overall Financial Resource Strain (CARDIA)    Difficulty of Paying Living Expenses: Not hard at all  Food Insecurity: No Food Insecurity (12/17/2021)   Hunger Vital Sign    Worried About Running Out of Food in the Last Year: Never true    [place] in the Last Year: Never true  Transportation Needs: No Transportation Needs (12/17/2021)   PRAPARE - Hydrologist (Medical): No    Lack of Transportation (Non-Medical): No  Physical Activity: Inactive (05/18/2021)   Exercise Vital Sign    Days of Exercise per Week: 0 days    Minutes of Exercise per Session: 0 min  Stress: Stress Concern Present (12/17/2021)   [place]    Feeling of Stress : To some extent  Social Connections: Moderately Isolated (05/18/2021)   Social Connection and Isolation Panel [NHANES]    Frequency of Communication with Friends and Family: More than three times a week    Frequency of Social Gatherings with Friends and Family: More than three times a week    Attends Religious Services: More than 4 times per year    Active Member of [employer] or Organizations: No    Attends Theatre manager Meetings: Never    Marital Status: Widowed  Intimate Partner Violence: Not At Risk (12/17/2021)   Humiliation, Afraid, Rape, and Kick questionnaire    Fear of Current or Ex-Partner: No    Emotionally Abused: No    Physically Abused: No    Sexually Abused: No     Constitutional: Denies fever, malaise, fatigue, headache or abrupt weight changes.  HEENT: Denies eye pain, eye redness, ear pain, ringing in the ears, wax buildup, runny nose, nasal congestion, bloody nose, or sore throat. Respiratory: Patient reports chronic cough and shortness of breath.  Denies difficulty breathing, or sputum production.   Cardiovascular: Denies chest pain, chest tightness, palpitations or swelling in the hands or feet.  Gastrointestinal: Denies abdominal pain, bloating, constipation, diarrhea or blood in the stool.  GU: Denies urgency, frequency, pain with urination, burning sensation, blood in urine, odor or discharge. Musculoskeletal: Patient reports back pain.  Denies decrease in range of motion, difficulty with gait, muscle pain or joint swelling.  Skin: Denies redness, rashes, lesions or ulcercations.  Neurological: Patient reports neuropathic pain, insomnia.  Denies dizziness, difficulty with memory, difficulty with speech or problems with balance and coordination.  Psych: Denies anxiety, depression, SI/HI.  No other specific complaints in a complete review of systems (except as listed in HPI above).  Objective:   Physical Exam  BP 126/74 (BP Location: Right Arm, Patient Position: Sitting, Cuff Size: Normal)   Pulse (!) 105   Temp (!) 96.9 F (36.1 C) (Temporal)   Wt 183 lb (83 kg)   SpO2 99%   BMI 27.02 kg/m   Wt Readings from Last 3 Encounters:  03/25/22 185 lb (83.9 kg)  03/21/22 200 lb (90.7 kg)  02/05/22 202 lb 3.2 oz (91.7 kg)    General: Appears his stated age, overweight, in NAD. Skin: Warm, dry and intact. Senile purpura noted. No ulcerations noted. HEENT: Head:  normal shape and size; Eyes: sclera white, no icterus, conjunctiva pink, PERRLA and EOMs intact;  Neck:  Neck supple, trachea midline. No masses, lumps or thyromegaly present.  Cardiovascular: Tachycardic with normal rhythm. S1,S2 noted.  No murmur, rubs or gallops noted. Trace pitting BLE edema. No carotid bruits noted. Pulmonary/Chest: Normal effort and positive vesicular breath sounds. No respiratory distress. No wheezes, rales or ronchi noted.  Abdomen: Soft and nontender. Normal bowel sounds.  Musculoskeletal: Gait slow and steady with use of rolling walker. Neurological: Head tremor noted. Alert and oriented.  Psychiatric: Mood and affect normal. Behavior is normal. Judgment and thought content normal.    BMET    Component Value Date/Time   NA 139 03/21/2022 1952   NA 141 08/03/2021 1323   K 4.6 03/21/2022 1952   CL 107 03/21/2022 1952   CO2 26 03/21/2022 1952   GLUCOSE 150 (H) 03/21/2022 1952   BUN 23 03/21/2022 1952   BUN 24 08/03/2021 1323   CREATININE 1.36 (H) 03/21/2022 1952   CREATININE 1.70 (H) 11/13/2021 1451   CALCIUM 9.4 03/21/2022 1952   GFRNONAA 54 (L) 03/21/2022 1952   GFRNONAA 70 12/06/2019 1420   GFRAA 55 (L) 11/07/2020 1534   GFRAA 81 12/06/2019 1420    Lipid Panel     Component Value Date/Time   CHOL 160 11/13/2021 1451   CHOL 149 02/08/2020 1228   TRIG 258 (H) 11/13/2021 1451   HDL 39 (L) 11/13/2021 1451   HDL 38 (L) 02/08/2020 1228   CHOLHDL 4.1 11/13/2021 1451   LDLCALC 87 11/13/2021 1451    CBC    Component Value Date/Time   WBC 11.3 (H) 03/21/2022 1952   RBC 4.53 03/21/2022 1952   HGB 12.9 (L) 03/21/2022 1952   HGB 13.2 07/24/2021 1543   HCT 41.0 03/21/2022 1952   HCT 38.8 07/24/2021 1543   PLT 229 03/21/2022 1952   PLT 194 07/24/2021 1543   MCV 90.5 03/21/2022 1952   MCV 93 07/24/2021 1543   MCH 28.5 03/21/2022 1952   MCHC 31.5 03/21/2022 1952   RDW 14.2 03/21/2022 1952   RDW 13.4 07/24/2021 1543   LYMPHSABS 1.7 03/21/2022 1952    LYMPHSABS 1.1 02/08/2020 1228   MONOABS 0.7 03/21/2022 1952   EOSABS 0.3 03/21/2022 1952   EOSABS 0.1 02/08/2020 1228   BASOSABS 0.1 03/21/2022 1952   BASOSABS 0.0 02/08/2020 1228    Hgb A1C Lab Results  Component Value Date   HGBA1C 10.9 (A) 11/13/2021            Assessment & Plan:     Webb Silversmith, NP

## 2022-03-31 NOTE — Assessment & Plan Note (Signed)
Encourage weight loss as this cannot produce sleep apnea symptoms Not wearing CPAP

## 2022-03-31 NOTE — Patient Instructions (Signed)

## 2022-03-31 NOTE — Assessment & Plan Note (Signed)
Continue aspirin 

## 2022-03-31 NOTE — Assessment & Plan Note (Signed)
Continue lisinopril, clonidine and carvedilol Reinforced DASH diet and exercise for weight loss We will monitor

## 2022-03-31 NOTE — Assessment & Plan Note (Signed)
Currently not wearing oxygen as he is living in his car I will send a note to pulmonology as he would like a small, portable oxygen tank

## 2022-03-31 NOTE — Assessment & Plan Note (Signed)
Continue Breztri and albuterol He will continue to follow with pulmonology

## 2022-03-31 NOTE — Assessment & Plan Note (Signed)
CBC reviewed 

## 2022-03-31 NOTE — Assessment & Plan Note (Signed)
POCT A1c 8.2% Urine microalbumin checked 12/2021 We will discontinue Lantus as he is currently homeless and living in his car Continue metformin We will add glipizide 5 mg twice daily Encourage low-carb diet and exercise weight loss Encourage routine eye exam Encourage routine foot exams

## 2022-03-31 NOTE — Assessment & Plan Note (Signed)
Lipid profile today Encouraged him to consume a low-fat diet He reports he is taking atorvastatin and ezetimibe as prescribed Continue aspirin

## 2022-03-31 NOTE — Assessment & Plan Note (Signed)
Persistent Consider neurology referral to rule out Parkinson's

## 2022-03-31 NOTE — Assessment & Plan Note (Signed)
Continue Tylenol and gabapentin as prescribed Encourage regular stretching and core strengthening

## 2022-03-31 NOTE — Assessment & Plan Note (Signed)
Stable off iron supplement

## 2022-04-01 ENCOUNTER — Telehealth: Payer: Medicare Other

## 2022-04-01 NOTE — Congregational Nurse Program (Signed)
  Dept: [facility] Nurse Program Note  Date of Encounter: 04/01/2022 Client to clinic today to discuss his PCP appointment from yesterday. He reports a new antidiabetic, he is now taking Glipizide 5 mg BID. He also reports some difficulty emptying his bladder. He stated he was drinking plenty of fluids, including a sports drink. He did not have pain, but only small amounts of urine "when he goes". Encouraged him to continue with increased fluids, especially water and the symptoms persist to please contact his PCP. He agreed with this plan.  Past Medical History: Past Medical History:  Diagnosis Date   (HFpEF) heart failure with preserved ejection fraction ([place])    a. 08/2017 Echo: EF 60-65%, Gr1 DD.   Arthritis    COPD (chronic obstructive pulmonary disease) ([place])    Coronary artery disease 2021   Depression    Diabetes mellitus without complication (HCC)    Hypertension    Incidental pulmonary nodule, greater than or equal to 63mm 03/11/2020   Left lower lobe - suspicious for bronchogenic neoplasm   Lymphedema    Morbid obesity ([place])    Prostate cancer ([place]) 03/2013   ? 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

## 2022-04-01 NOTE — Chronic Care Management (AMB) (Signed)
  Care Management  Collaboration  Note  04/01/2022 Name: Nathaniel Little MRN: [id number] DOB: [birth year]  Nathaniel Little is a 76 y.o. year old male who is a primary care patient of Jearld Fenton, NP. The CCM team was consulted reference care coordination needs for Nathaniel Little .  Assessment: Patient was not interviewed or contacted during this encounter.    Intervention:Conducted brief assessment, recommendations and relevant information discussed.  CCM LCSW collaborated with [place], Noreene Larsson,  to assist with meeting patient's needs related to housing instability.    Follow up Plan: CCM LCSW will continue to collaborate with RNCM in order to meet patient's needs. CCM LCSW made an attempt to complete initial assessment; however, voicemail was full. Will route chart to Care Guide to reschedule phone appointment     Review of patient past medical history, allergies, medications, and health status, including review of pertinent consultant reports was performed as part of comprehensive evaluation and provision of care management/care coordination services.   Christa See, MSW, [facility] Care Management [email or web address] Phone [phone] 10:49 AM

## 2022-04-02 DIAGNOSIS — E785 Hyperlipidemia, unspecified: Secondary | ICD-10-CM

## 2022-04-02 DIAGNOSIS — F32A Depression, unspecified: Secondary | ICD-10-CM

## 2022-04-02 DIAGNOSIS — E1159 Type 2 diabetes mellitus with other circulatory complications: Secondary | ICD-10-CM

## 2022-04-02 DIAGNOSIS — J449 Chronic obstructive pulmonary disease, unspecified: Secondary | ICD-10-CM

## 2022-04-02 DIAGNOSIS — I509 Heart failure, unspecified: Secondary | ICD-10-CM

## 2022-04-02 DIAGNOSIS — I110 Hypertensive heart disease with heart failure: Secondary | ICD-10-CM

## 2022-04-08 ENCOUNTER — Emergency Department: Payer: Medicare Other

## 2022-04-08 ENCOUNTER — Encounter: Payer: Self-pay | Admitting: *Deleted

## 2022-04-08 ENCOUNTER — Other Ambulatory Visit: Payer: Self-pay

## 2022-04-08 DIAGNOSIS — I700 Atherosclerosis of aorta: Secondary | ICD-10-CM | POA: Diagnosis not present

## 2022-04-08 DIAGNOSIS — I110 Hypertensive heart disease with heart failure: Secondary | ICD-10-CM | POA: Insufficient documentation

## 2022-04-08 DIAGNOSIS — Z7951 Long term (current) use of inhaled steroids: Secondary | ICD-10-CM | POA: Diagnosis not present

## 2022-04-08 DIAGNOSIS — R0602 Shortness of breath: Secondary | ICD-10-CM | POA: Diagnosis not present

## 2022-04-08 DIAGNOSIS — J449 Chronic obstructive pulmonary disease, unspecified: Secondary | ICD-10-CM | POA: Insufficient documentation

## 2022-04-08 DIAGNOSIS — I509 Heart failure, unspecified: Secondary | ICD-10-CM | POA: Diagnosis not present

## 2022-04-08 DIAGNOSIS — Z7984 Long term (current) use of oral hypoglycemic drugs: Secondary | ICD-10-CM | POA: Insufficient documentation

## 2022-04-08 DIAGNOSIS — Z7982 Long term (current) use of aspirin: Secondary | ICD-10-CM | POA: Insufficient documentation

## 2022-04-08 DIAGNOSIS — R2242 Localized swelling, mass and lump, left lower limb: Secondary | ICD-10-CM | POA: Insufficient documentation

## 2022-04-08 DIAGNOSIS — E119 Type 2 diabetes mellitus without complications: Secondary | ICD-10-CM | POA: Insufficient documentation

## 2022-04-08 DIAGNOSIS — M79672 Pain in left foot: Secondary | ICD-10-CM | POA: Diagnosis not present

## 2022-04-08 DIAGNOSIS — J90 Pleural effusion, not elsewhere classified: Secondary | ICD-10-CM | POA: Diagnosis not present

## 2022-04-08 DIAGNOSIS — I2510 Atherosclerotic heart disease of native coronary artery without angina pectoris: Secondary | ICD-10-CM | POA: Diagnosis not present

## 2022-04-08 DIAGNOSIS — M7989 Other specified soft tissue disorders: Secondary | ICD-10-CM | POA: Diagnosis not present

## 2022-04-08 DIAGNOSIS — Z8546 Personal history of malignant neoplasm of prostate: Secondary | ICD-10-CM | POA: Insufficient documentation

## 2022-04-08 DIAGNOSIS — Z79899 Other long term (current) drug therapy: Secondary | ICD-10-CM | POA: Insufficient documentation

## 2022-04-08 LAB — CBC
HCT: 35.8 % — ABNORMAL LOW (ref 39.0–52.0)
Hemoglobin: 11.4 g/dL — ABNORMAL LOW (ref 13.0–17.0)
MCH: 28 pg (ref 26.0–34.0)
MCHC: 31.8 g/dL (ref 30.0–36.0)
MCV: 88 fL (ref 80.0–100.0)
Platelets: 230 10*3/uL (ref 150–400)
RBC: 4.07 MIL/uL — ABNORMAL LOW (ref 4.22–5.81)
RDW: 14.3 % (ref 11.5–15.5)
WBC: 9.4 10*3/uL (ref 4.0–10.5)
nRBC: 0 % (ref 0.0–0.2)

## 2022-04-08 LAB — BASIC METABOLIC PANEL
Anion gap: 6 (ref 5–15)
BUN: 22 mg/dL (ref 8–23)
CO2: 26 mmol/L (ref 22–32)
Calcium: 8.7 mg/dL — ABNORMAL LOW (ref 8.9–10.3)
Chloride: 108 mmol/L (ref 98–111)
Creatinine, Ser: 1.58 mg/dL — ABNORMAL HIGH (ref 0.61–1.24)
GFR, Estimated: 45 mL/min — ABNORMAL LOW (ref >60)
Glucose, Bld: 121 mg/dL — ABNORMAL HIGH (ref 70–99)
Potassium: 3.5 mmol/L (ref 3.5–5.1)
Sodium: 140 mmol/L (ref 135–145)

## 2022-04-08 LAB — TROPONIN I (HIGH SENSITIVITY): Troponin I (High Sensitivity): 19 ng/L — ABNORMAL HIGH (ref <18)

## 2022-04-08 NOTE — ED Triage Notes (Signed)
Pt reports sob and swelling to left foot.  Pt is homeless.  Pt reports he is suppose to be on oxygen, but not since he became homeless.  No chest pain .  Pt has a cough.  Pt  alert  speech clear.  Pt wearing a cast shoe on left foot.  Cig smoker.

## 2022-04-08 NOTE — ED Provider Triage Note (Signed)
Emergency Medicine Provider Triage Evaluation Note  Nathaniel Little, a 76 y.o. male  was evaluated in triage.  Pt complains of shortness of breath as well as swelling to his left foot.  Patient reports that he is currently on home.  He also reports he should be on all O2 oxygen therapy.  Patient denies any chest pain or cough at this time.  He also denies any fevers, chills, sweats patient is wearing a cast shoe on his left foot..  Review of Systems  Positive: Left foot pain, SOB Negative: FCS  Physical Exam  BP 125/63 (BP Location: Left Arm)   Pulse 78   Temp 98.2 F (36.8 C) (Oral)   Resp (!) 22   Ht 5\' 9"  (1.753 m)   Wt 85.7 kg   SpO2 95%   BMI 27.91 kg/m  Gen:   Awake, no distress  NAD Resp:  Normal effort CTA MSK:   Moves extremities without difficulty  Other:    Medical Decision Making  Medically screening exam initiated at 9:22 PM.  Appropriate orders placed.  Beatriz Settles was informed that the remainder of the evaluation will be completed by another provider, this initial triage assessment does not replace that evaluation, and the importance of remaining in the ED until their evaluation is complete.  Geriatric patient to the ED for evaluation of left foot pain as well as some shortness of breath.  Patient denies any recent injury, trauma, fall.   Melvenia Needles, PA-C 04/08/22 2201

## 2022-04-09 ENCOUNTER — Emergency Department: Payer: Medicare Other

## 2022-04-09 ENCOUNTER — Emergency Department
Admission: EM | Admit: 2022-04-09 | Discharge: 2022-04-09 | Disposition: A | Discharge status: Home / Self Care | Payer: Medicare Other | Attending: Emergency Medicine | Admitting: Emergency Medicine

## 2022-04-09 DIAGNOSIS — M7989 Other specified soft tissue disorders: Secondary | ICD-10-CM

## 2022-04-09 DIAGNOSIS — R0602 Shortness of breath: Secondary | ICD-10-CM

## 2022-04-09 DIAGNOSIS — M79672 Pain in left foot: Secondary | ICD-10-CM | POA: Diagnosis not present

## 2022-04-09 LAB — TROPONIN I (HIGH SENSITIVITY): Troponin I (High Sensitivity): 16 ng/L (ref <18)

## 2022-04-09 MED ORDER — IOHEXOL 350 MG/ML SOLN
75.0000 mL | Freq: Once | INTRAVENOUS | Status: AC | PRN
  Administered 2022-04-09: 75 mL via INTRAVENOUS

## 2022-04-09 NOTE — ED Provider Notes (Signed)
[facility] Provider Note    Event Date/Time   First MD Initiated Contact with Patient 04/09/22 0103     (approximate)   History   Shortness of Breath and Leg Swelling   HPI  Nathaniel Little is a 76 y.o. male with history of COPD, hypertension, diabetes, aortic valve replacement due to severe aortic stenosis, heart failure who presents to the emergency department with complaints of shortness of breath.  Reports anterior chest pain as well.  No fevers but has had nonproductive cough.  Also reports swelling to the left foot without any known injury.  Has his foot in a brace that he is supposed to be wearing on his right foot but states he had to wear it on the left foot today because his foot was so swollen he could not put his tennis shoe on.  No history of PE or DVT.  Patient is currently homeless.  History provided by patient.    Past Medical History:  Diagnosis Date   (HFpEF) heart failure with preserved ejection fraction ([place])    a. 08/2017 Echo: EF 60-65%, Gr1 DD.   Arthritis    COPD (chronic obstructive pulmonary disease) ([place])    Coronary artery disease 2021   Depression    Diabetes mellitus without complication (HCC)    Hypertension    Incidental pulmonary nodule, greater than or equal to 65m 03/11/2020   Left lower lobe - suspicious for bronchogenic neoplasm   Lymphedema    Morbid obesity ([place]    Prostate cancer ([place] 03/2013   ? PANCREATIC   S/P TAVR (transcatheter aortic valve replacement) 04/01/2020   s/p TAVR with a 29 mm Edwards Sapien 3 THV via the TF approach by Drs MBuena Irish& ORoxy Manns  Severe aortic stenosis    Sleep apnea with use of continuous positive airway pressure (CPAP)    Tobacco abuse    Tremor    HEAD    Past Surgical History:  Procedure Laterality Date   ADENOIDECTOMY     APPENDECTOMY     CARDIAC CATHETERIZATION     CATARACT EXTRACTION W/PHACO Left 11/28/2018   Procedure: CATARACT EXTRACTION PHACO AND INTRAOCULAR LENS  PLACEMENT ([place] LEFT, DIABETIC;  Surgeon: PBirder Robson MD;  Location: [facility];  Service: Ophthalmology;  Laterality: Left;  [place] 00:56 CDE 9.44 Fluid pack lot # [id number]   EUS N/A 10/13/2017   Procedure: FULL UPPER ENDOSCOPIC ULTRASOUND (EUS) RADIAL;  Surgeon: [name];  [facility];  Service: Gastroenterology;  Laterality: N/A;   FLEXIBLE BRONCHOSCOPY N/A 11/19/2020   Procedure: FLEXIBLE BRONCHOSCOPY;  Surgeon: HMelrose Nakayama MD;  [place]  Service: Thoracic;  Laterality: N/A;   INTERCOSTAL NERVE BLOCK Left 11/17/2020   Procedure: INTERCOSTAL NERVE BLOCK;  Surgeon: HMelrose Nakayama MD;  [place] [facility]  Service: Thoracic;  Laterality: Left;   NODE DISSECTION Left 11/17/2020   Procedure: NODE DISSECTION;  Surgeon: HMelrose Nakayama MD;  [place]  Service: Thoracic;  Laterality: Left;   PANCREATICODUODENECTOMY  07/23/2018   RIGHT/LEFT HEART CATH AND CORONARY ANGIOGRAPHY N/A 02/21/2020   Procedure: RIGHT/LEFT HEART CATH AND CORONARY ANGIOGRAPHY;  Surgeon: GMinna Merritts MD;  [facility];  Service: Cardiovascular;  Laterality: N/A;   TEE WITHOUT CARDIOVERSION N/A 04/01/2020   Procedure: TRANSESOPHAGEAL ECHOCARDIOGRAM (TEE);  Surgeon: MBurnell Blanks MD;  [facility];  Service: Open Heart Surgery;  Laterality: N/A;   TONSILLECTOMY  TRANSCATHETER AORTIC VALVE REPLACEMENT, TRANSFEMORAL N/A 04/01/2020   Procedure: TRANSCATHETER AORTIC VALVE REPLACEMENT, TRANSFEMORAL;  Surgeon: Burnell Blanks, MD;  Location: [place] [facility] LAB;  Service: Open Heart Surgery;  Laterality: N/A;   [place]      MEDICATIONS:  Prior to Admission medications   Medication Sig Start Date End Date Taking? Authorizing Provider  albuterol (VENTOLIN HFA) 108 (90 Base) MCG/ACT inhaler Inhale 2 puffs into the lungs every 6 (six) hours as needed for wheezing or shortness of breath. 12/11/21   Jearld Fenton, NP   aspirin EC 81 MG tablet Take 81 mg by mouth daily.    [provider]  atorvastatin (LIPITOR) 10 MG tablet Take 1 tablet (10 mg total) by mouth daily. 01/08/22   Minna Merritts, MD  BREZTRI AEROSPHERE 160-9-4.8 MCG/ACT AERO Inhale 2 puffs into the lungs 2 (two) times daily. 03/31/22   Jearld Fenton, NP  calcium-vitamin D (OSCAL WITH D) 250-125 MG-UNIT tablet Take 1 tablet by mouth daily.    [provider]  carvedilol (COREG) 6.25 MG tablet Take 1 tablet (6.25 mg total) by mouth 2 (two) times daily with a meal. 01/08/22 01/03/23  Minna Merritts, MD  cloNIDine (CATAPRES) 0.1 MG tablet Take 1 tablet (0.1 mg total) by mouth 2 (two) times daily. 01/08/22   Minna Merritts, MD  ezetimibe (ZETIA) 10 MG tablet Take 1 tablet (10 mg total) by mouth daily. 01/08/22 01/03/23  Minna Merritts, MD  fluticasone Asencion Islam) 50 MCG/ACT nasal spray  08/13/21   [provider]  gabapentin (NEURONTIN) 100 MG capsule TAKE 1-2 CAPSULES BY MOUTH AT BEDTIME 03/16/22   Jearld Fenton, NP  glipiZIDE (GLUCOTROL) 5 MG tablet Take 1 tablet (5 mg total) by mouth 2 (two) times daily before a meal. 03/31/22   Baity, Coralie Keens, NP  glucose blood (ACCU-CHEK GUIDE) test strip Check blood sugar twice daily 08/10/21   Jearld Fenton, NP  ipratropium-albuterol (DUONEB) 0.5-2.5 (3) MG/3ML SOLN Take 3 mLs by nebulization every 6 (six) hours as needed (Asthma). 01/06/21   Kathrine Haddock, NP  lisinopril (ZESTRIL) 5 MG tablet Take 1 tablet (5 mg total) by mouth daily. 01/08/22   Minna Merritts, MD  metFORMIN (GLUCOPHAGE) 500 MG tablet Take 1,000 mg by mouth in the morning and at bedtime. 11/04/18   [provider]  pantoprazole (PROTONIX) 40 MG tablet TAKE 1 TABLET BY MOUTH TWICE DAILY 03/16/22   Jearld Fenton, NP  potassium chloride (KLOR-CON) 10 MEQ tablet Take 10 mEq by mouth daily.    [provider]  sennosides-docusate sodium (SENOKOT-S) 8.6-50 MG tablet Take 1 tablet by mouth in the morning and  at bedtime. 11/13/21   Jearld Fenton, NP  torsemide (DEMADEX) 10 MG tablet Take 1 tablet (10 mg total) by mouth daily. 01/08/22   Minna Merritts, MD    Physical Exam   Triage Vital Signs: ED Triage Vitals  Enc Vitals Group     BP 04/08/22 2116 125/63     Pulse Rate 04/08/22 2116 78     Resp 04/08/22 2116 (!) 22     Temp 04/08/22 2116 98.2 F (36.8 C)     Temp Source 04/08/22 2116 Oral     SpO2 04/08/22 2116 95 %     Weight 04/08/22 2117 189 lb (85.7 kg)     Height 04/08/22 2117 _0  (1.753 m)     Head Circumference --      Peak Flow --  Pain Score 04/08/22 2117 8     Pain Loc --      Pain Edu? --      Excl. in [place]? --     Most recent vital signs: Vitals:   04/09/22 0130 04/09/22 0330  BP: 125/64 110/63  Pulse: 63 (!) 58  Resp: 18 16  Temp:    SpO2: 94% 92%    CONSTITUTIONAL: Alert and oriented and responds appropriately to questions. Well-appearing; well-nourished HEAD: Normocephalic, atraumatic EYES: Conjunctivae clear, pupils appear equal, sclera nonicteric ENT: normal nose; moist mucous membranes NECK: Supple, normal ROM CARD: RRR; S1 and S2 appreciated; + systolic murmur, no clicks, no rubs, no gallops RESP: Normal chest excursion without splinting or tachypnea; breath sounds clear and equal bilaterally; no wheezes, no rhonchi, no rales, no hypoxia or respiratory distress, speaking full sentences ABD/GI: Normal bowel sounds; non-distended; soft, non-tender, no rebound, no guarding, no peritoneal signs BACK: The back appears normal EXT: Normal ROM in all joints; no deformity noted, no edema; no cyanosis SKIN: Normal color for age and race; warm; no rash on exposed skin NEURO: Moves all extremities equally, normal speech PSYCH: The patient's mood and manner are appropriate.   ED Results / Procedures / Treatments   LABS: (all labs ordered are listed, but only abnormal results are displayed) Labs Reviewed  BASIC METABOLIC PANEL - Abnormal; Notable for the  following components:      Result Value   Glucose, Bld 121 (*)    Creatinine, Ser 1.58 (*)    Calcium 8.7 (*)    GFR, Estimated 45 (*)    All other components within normal limits  CBC - Abnormal; Notable for the following components:   RBC 4.07 (*)    Hemoglobin 11.4 (*)    HCT 35.8 (*)    All other components within normal limits  TROPONIN I (HIGH SENSITIVITY) - Abnormal; Notable for the following components:   Troponin I (High Sensitivity) 19 (*)    All other components within normal limits  TROPONIN I (HIGH SENSITIVITY)     EKG:  EKG Interpretation  Date/Time:  Thursday April 08 2022 21:24:04 EDT Ventricular Rate:  79 PR Interval:  192 QRS Duration: 94 QT Interval:  388 QTC Calculation: 444 R Axis:   2 Text Interpretation: Normal sinus rhythm Possible Inferior infarct , age undetermined Anterior infarct (cited on or before 24-Oct-2021) Abnormal ECG When compared with ECG of 21-Mar-2022 19:48, QRS axis Shifted right T wave inversion now evident in Inferior leads Confirmed by Pryor Curia [phone]) on 04/09/2022 1:05:21 AM         RADIOLOGY: My personal review and interpretation of imaging: CTA of the chest shows no PE, infiltrate, edema.  He does have a small amount of pleural effusion in the left lung due to previous lobectomy that is stable.  Ultrasound shows no DVT.  X-ray of the left foot shows no fracture.  I have personally reviewed all radiology reports.   CT Angio Chest PE W and/or Wo Contrast  Result Date: 04/09/2022 CLINICAL DATA:  Shortness of breath and left foot swelling. EXAM: CT ANGIOGRAPHY CHEST WITH CONTRAST TECHNIQUE: Multidetector CT imaging of the chest was performed using the standard protocol during bolus administration of intravenous contrast. Multiplanar CT image reconstructions and MIPs were obtained to evaluate the vascular anatomy. RADIATION DOSE REDUCTION: This exam was performed according to the departmental dose-optimization program which includes  automated exposure control, adjustment of the mA and/or kV according to patient size and/or use of  iterative reconstruction technique. CONTRAST:  62m OMNIPAQUE IOHEXOL 350 MG/ML SOLN COMPARISON:  January 07, 2022 FINDINGS: Cardiovascular: There is mild calcification of the aortic arch, without evidence of aortic aneurysm. An artificial aortic valve is seen. Satisfactory opacification of the pulmonary arteries to the segmental level. No evidence of pulmonary embolism. There is mild cardiomegaly with mild coronary artery calcification. No pericardial effusion. Mediastinum/Nodes: No enlarged mediastinal, hilar, or axillary lymph nodes. Thyroid gland, trachea, and esophagus demonstrate no significant findings. Lungs/Pleura: There is mild central lobular emphysematous lung disease. Stable, mild to moderate severity left lower lobe scarring and/or atelectasis is seen. A trace amount of pleural fluid is seen on the left. This is also stable in appearance. No pneumothorax is identified. Upper Abdomen: A biliary stent is in place. Musculoskeletal: Multilevel degenerative changes seen throughout the thoracic spine. Review of the MIP images confirms the above findings. IMPRESSION: 1. No evidence of pulmonary embolism. 2. Stable, mild to moderate severity left lower lobe scarring and/or atelectasis with a trace amount of pleural fluid on the left, likely secondary to prior left lower lobectomy. 3. Mild cardiomegaly with mild coronary artery calcification. 4. Evidence of prior aortic valve replacement. Aortic Atherosclerosis (ICD10-I70.0) and Emphysema (ICD10-J43.9). Electronically Signed   By: TVirgina NorfolkM.D.   On: 04/09/2022 02:45   DG Foot Complete Left  Result Date: 04/09/2022 CLINICAL DATA:  Left foot pain and swelling, initial encounter EXAM: LEFT FOOT - COMPLETE 3+ VIEW COMPARISON:  06/07/2018 FINDINGS: Calcaneal spurring is noted. No acute fracture or dislocation is noted. Generalized swelling of the foot is  noted particularly over the metatarsals. No other focal abnormality is seen. IMPRESSION: Generalized soft tissue swelling without acute bony abnormality. Electronically Signed   By: MInez CatalinaM.D.   On: 04/09/2022 01:31   [place] Img Lower Unilateral Left  Result Date: 04/09/2022 CLINICAL DATA:  Left leg swelling for 2 days EXAM: LEFT LOWER EXTREMITY VENOUS DOPPLER ULTRASOUND TECHNIQUE: Gray-scale sonography with graded compression, as well as color Doppler and duplex ultrasound were performed to evaluate the lower extremity deep venous systems from the level of the common femoral vein and including the common femoral, femoral, profunda femoral, popliteal and calf veins including the posterior tibial, peroneal and gastrocnemius veins when visible. The superficial great saphenous vein was also interrogated. Spectral Doppler was utilized to evaluate flow at rest and with distal augmentation maneuvers in the common femoral, femoral and popliteal veins. COMPARISON:  None Available. FINDINGS: Contralateral Common Femoral Vein: Respiratory phasicity is normal and symmetric with the symptomatic side. No evidence of thrombus. Normal compressibility. Common Femoral Vein: No evidence of thrombus. Normal compressibility, respiratory phasicity and response to augmentation. Saphenofemoral Junction: No evidence of thrombus. Normal compressibility and flow on color Doppler imaging. Profunda Femoral Vein: No evidence of thrombus. Normal compressibility and flow on color Doppler imaging. Femoral Vein: No evidence of thrombus. Normal compressibility, respiratory phasicity and response to augmentation. Popliteal Vein: No evidence of thrombus. Normal compressibility, respiratory phasicity and response to augmentation. Calf Veins: No evidence of thrombus. Normal compressibility and flow on color Doppler imaging. Superficial Great Saphenous Vein: No evidence of thrombus. Normal compressibility. Venous Reflux:  None. Other  Findings:  None. IMPRESSION: No evidence of deep venous thrombosis. Electronically Signed   By: MInez CatalinaM.D.   On: 04/09/2022 00:07   DG Chest 2 View  Result Date: 04/08/2022 CLINICAL DATA:  Shortness of breath and left foot swelling. EXAM: CHEST - 2 VIEW COMPARISON:  March 21, 2022 FINDINGS: The  heart size and mediastinal contours are within normal limits. An artificial aortic valve is seen. There is moderate severity calcification of the aortic arch. A small, stable left-sided pleural effusion is seen. There is no evidence of acute infiltrate or pneumothorax. The visualized skeletal structures are unremarkable. IMPRESSION: Stable small left-sided pleural effusion. Electronically Signed   By: Virgina Norfolk M.D.   On: 04/08/2022 21:44     PROCEDURES:  Critical Care performed: No     .1-3 Lead EKG Interpretation  Performed by: Yoona Ishii, Delice Bison, DO Authorized by: Carmyn Hamm, Delice Bison, DO     Interpretation: normal     ECG rate:  60   ECG rate assessment: normal     Rhythm: sinus rhythm     Ectopy: none     Conduction: normal       IMPRESSION / MDM / ASSESSMENT AND PLAN / ED COURSE  I reviewed the triage vital signs and the nursing notes.    Patient here with complaints of chest pain, shortness of breath and left foot swelling.  The patient is on the cardiac monitor to evaluate for evidence of arrhythmia and/or significant heart rate changes.   DIFFERENTIAL DIAGNOSIS (includes but not limited to):   CHF, PE, DVT, ACS, pneumonia.  No sign of arterial obstruction, cellulitis, gout, septic arthritis.  Low suspicion for fracture of the foot.   Patient's presentation is most consistent with acute presentation with potential threat to life or bodily function.   PLAN: We will obtain CBC, BMP, BNP, troponin x2, x-ray of the left foot, venous Doppler of the left lower extremity, chest x-ray.   MEDICATIONS GIVEN IN ED: Medications  iohexol (OMNIPAQUE) 350 MG/ML injection 75 mL  (75 mLs Intravenous Contrast Given 04/09/22 0159)     ED COURSE: Patient's labs show no leukocytosis.  Stable hemoglobin.  First troponin minimally elevated at 19 but has come down to 16 on the second troponin.  Patient has chronic kidney disease and creatinine is stable today.  Chest x-ray reviewed/interpreted by myself and radiologist and shows a small stable left pleural effusion.  X-ray of the left foot reviewed/interpreted by myself shows no fracture or other acute abnormality..  Reviewed/interpreted by myself radiology shows no DVT.  We will proceed with CTA of the chest to evaluate for pulmonary embolus.   CTA of the chest reviewed/interpreted myself radiologist and shows no PE, infiltrate, edema.  He has small amount of pleural fluid in the left lower lung due to his previous lobectomy.  He has no hypoxia here except with sleeping he will drop briefly to the 80s but when aroused he quickly comes back up into the upper 90s.  Does have history of sleep apnea.  It does appear he has a primary doctor for close follow-up.  No indication for medical admission at this time.  Patient is comfortable with plan for discharge from the ED.   At this time, I do not feel there is any life-threatening condition present. I reviewed all nursing notes, vitals, pertinent previous records.  All lab and urine results, EKGs, imaging ordered have been independently reviewed and interpreted by myself.  I reviewed all available radiology reports from any imaging ordered this visit.  Based on my assessment, I feel the patient is safe to be discharged home without further emergent workup and can continue workup as an outpatient as needed. Discussed all findings, treatment plan as well as usual and customary return precautions.  They verbalize understanding and are comfortable with this  plan.  Outpatient follow-up has been provided as needed.  All questions have been answered.     CONSULTS: Admission considered but patient  has no sign of PE, DVT and has had 2 negative troponins here.  No events noted on cardiac monitoring.  Brief oxygen desaturation with sleeping but will quickly come back up to the upper 90s on room air.  No sign of CHF exacerbation today other than very mild edema in the left foot.  Recommended compression stockings, elevation of the extremity.  I do not feel he needs admission to the hospital at this time and he is comfortable with outpatient follow-up.   OUTSIDE RECORDS REVIEWED: Reviewed patient's last office visit with Dr. Holley Raring on 01/19/2022.       FINAL CLINICAL IMPRESSION(S) / ED DIAGNOSES   Final diagnoses:  Shortness of breath  Swelling of left foot     Rx / DC Orders   ED Discharge Orders     None        Note:  This document was prepared using Dragon voice recognition software and may include unintentional dictation errors.   Alondra Vandeven, Delice Bison, DO 04/09/22 [phone]

## 2022-04-09 NOTE — Discharge Instructions (Signed)
CT scan your chest showed small amount of fluid in the left lung from your previous surgery but no acute abnormality.  There was no blood clot in your leg or lung.  Your cardiac test showed no sign of heart attack.  Please follow-up with your primary care doctor as an outpatient.  Recommend keeping your left leg elevated when at rest and you may use compression stockings to help with swelling.  You may take over-the-counter Tylenol as needed for pain control.

## 2022-04-15 ENCOUNTER — Telehealth: Payer: Medicare Other

## 2022-04-15 ENCOUNTER — Ambulatory Visit (INDEPENDENT_AMBULATORY_CARE_PROVIDER_SITE_OTHER): Payer: Medicare Other

## 2022-04-15 DIAGNOSIS — E1142 Type 2 diabetes mellitus with diabetic polyneuropathy: Secondary | ICD-10-CM

## 2022-04-15 DIAGNOSIS — E782 Mixed hyperlipidemia: Secondary | ICD-10-CM

## 2022-04-15 DIAGNOSIS — Z5900 Homelessness unspecified: Secondary | ICD-10-CM

## 2022-04-15 DIAGNOSIS — F324 Major depressive disorder, single episode, in partial remission: Secondary | ICD-10-CM

## 2022-04-15 DIAGNOSIS — I5032 Chronic diastolic (congestive) heart failure: Secondary | ICD-10-CM

## 2022-04-15 DIAGNOSIS — M25551 Pain in right hip: Secondary | ICD-10-CM

## 2022-04-15 DIAGNOSIS — J431 Panlobular emphysema: Secondary | ICD-10-CM

## 2022-04-15 DIAGNOSIS — I10 Essential (primary) hypertension: Secondary | ICD-10-CM

## 2022-04-15 DIAGNOSIS — J449 Chronic obstructive pulmonary disease, unspecified: Secondary | ICD-10-CM

## 2022-04-15 DIAGNOSIS — I509 Heart failure, unspecified: Secondary | ICD-10-CM

## 2022-04-15 NOTE — Patient Instructions (Signed)
Visit Information  Thank you for taking time to visit with me today. Please don't hesitate to contact me if I can be of assistance to you before our next scheduled telephone appointment.  Following are the goals we discussed today:  Current Barriers:  Knowledge Deficits related to plan of care for management of CHF, HTN, HLD, COPD, DMII, Chronic Pain, and Depression and Falls Precautions  Care Coordination needs related to Mental Health Concerns   Chronic Disease Management support and education needs related to CHF, HTN, HLD, COPD, DMII, Chronic Pain, and depression, falls prevention 03-30-2022: The patient is now homeless and living in his car. Congregational nurse trying to help the patient find a place for him to live and resources sent to office staff to print off for the patient and give to the patient for appointment with pcp on 03-31-2022. 04-15-2022: The patient has placed a deposit of $100.00 down for a home and will find out the end of this week or next week if he will get it. He can afford it and is hopeful that he will be able to get it and move in soon.    RNCM Clinical Goal(s):  Patient will verbalize understanding of plan for management of CHF, HTN, HLD, COPD, DMII, Depression, Osteoarthritis, and Falls  as evidenced by keeping appointments, following the plan of care, and working with the CCM team to optimize health and wellbeing  take all medications exactly as prescribed and will call provider for medication related questions as evidenced by compliance with medications and calling for refills before running out of medications    attend all scheduled medical appointments: with pcp and specialist as evidenced by keeping appointments and calling for schedule change needs.        demonstrate improved and ongoing adherence to prescribed treatment plan for CHF, HTN, HLD, COPD, DMII, Depression, Osteoarthritis, and falls prevention as evidenced by VS stable, routine lab work, keeping  appointments, A1C WNL, Pain controlled, and no acute exacerbations of CHF or COPD. continue to work with Consulting civil engineer and/or Social Worker to address care management and care coordination needs related to CHF, HTN, HLD, COPD, DMII, Depression, Osteoarthritis, and falls prevention  as evidenced by adherence to CM Team Scheduled appointments     work with pharmacist to address medications, medication reconciliation, and management  related to CHF, HTN, HLD, COPD, DMII, Depression, Osteoarthritis, and falls prevention as evidenced by review of EMR and patient or pharmacist report    demonstrate ongoing self health care management ability effective management of chronic conditions as evidenced by working with CCM team through collaboration with Consulting civil engineer, provider, and care team.    Interventions: 1:1 collaboration with primary care provider regarding development and update of comprehensive plan of care as evidenced by provider attestation and co-signature Inter-disciplinary care team collaboration (see longitudinal plan of care) Evaluation of current treatment plan related to  self management and patient's adherence to plan as established by provider     Heart Failure Interventions:  (Status: Goal on Track (progressing): YES.)  Long Term Goal     Wt Readings from Last 3 Encounters:  04/08/22 189 lb (85.7 kg)  03/31/22 183 lb (83 kg)  03/25/22 185 lb (83.9 kg)    Basic overview and discussion of pathophysiology of Heart Failure reviewed. 03-30-2022: The patient is currently not able to weigh daily due to being homeless. Is trying to find stable housing. 04-15-2022: The patient is unable to weigh daily but has had a  couple trips to the ER recently due to shortness of breath. Was having some swelling in his left leg but he states that is better. He was there last week and his weight was stable and he checked out okay. He is still homeless but is hopeful to get stable housing soon as he has found a  place and put a deposit down.  Provided education on low sodium diet. 03-30-2022: The patient is getting meals at a church about 4 days a week. Education and support given. 04-15-2022: Continues to get assistance with meals at a church. Is staying hydrated and drinking a drink he likes called [employer] Reviewed Heart Failure Action Plan in depth and provided written copy Assessed need for readable accurate scales in home. 04-15-2022: Currently homeless. The patient unable to weigh daily Provided education about placing scale on hard, flat surface Advised patient to weigh each morning after emptying bladder Discussed importance of daily weight and advised patient to weigh and record daily Reviewed role of diuretics in prevention of fluid overload and management of heart failure Discussed the importance of keeping all appointments with provider Provided patient with education about the role of exercise in the management of heart failure Advised patient to discuss changes in water weight and exacerbations of heart failure with provider. 04-15-2022: Education and support given. The patient is monitoring for changes in his weight. The patient states that he cannot weigh daily but knows what to look for for changes Screening for signs and symptoms of depression related to chronic disease state  Assessed social determinant of health barriers   COPD: (Status: Goal on Track (progressing): YES.) Long Term Goal  Reviewed medications with patient, including use of prescribed maintenance and rescue inhalers, and provided instruction on medication management and the importance of adherence. 04-15-2022: The patient is compliant with medications.  Provided patient with basic written and verbal COPD education on self care/management/and exacerbation prevention. 03-30-2022: The patient moved to a new place and states that he could not stay there as there was mold in the home and it was impacting his breathing. The  patient is currently homeless and living in his car. He does go to [employer] [facility] [employer] in [place] and takes a shower and gets meals. Education and support given. 04-15-2022: The patient was in the ER on 04-09-2022 for Shortness of breath. The patient states he feels it was a combination of things and he also is still living in his car. He is hopeful to have housing soon. Denies any acute distress today.  Advised patient to track and manage COPD triggers. 11-09-2021: Review of triggers and education given. The patient was in the ER in January for exacerbation of COPD. The patient states he is doing fine now. Is coughing up a lot of clear sputum. Education on weather changes and sinus concerns and to discuss with pcp. 04-15-2022: Review of triggers that cause issues and concerns for exacerbations Provided written and verbal instructions on pursed lip breathing and utilized returned demonstration as teach back Provided instruction about proper use of medications used for management of COPD including inhalers Advised patient to self assesses COPD action plan zone and make appointment with provider if in the yellow zone for 48 hours without improvement Advised patient to engage in light exercise as tolerated 3-5 days a week to aid in the the management of COPD Provided education about and advised patient to utilize infection prevention strategies to reduce risk of respiratory infection. 03-30-2022:  Denies any recent respiratory infections or other concerns. Will continue to monitor. States his breathing is stable at this time. Discussed the importance of adequate rest and management of fatigue with COPD   Diabetes:  (Status: Goal on Track (progressing): YES.) Long Term Goal         Lab Results  Component Value Date    HGBA1C 8.2 (A) 03/31/2022  Assessed patient's understanding of A1c goal: <7%. 03-30-2022: The patient is above goal. Education and support given. 04-15-2022: The patient remains above  goal but is trending downward. The patient was started on new medications and he says this is helping.  Provided education to patient about basic DM disease process. 03-10-2022: Review of DM and how to effectively manage. The patient is homeless at this time and is not eating his usual meals. He gets meals at the church about 4 days a week. The other 3 days he has to go without or eats whatever is on hand. The patient knows how to manage his DM but main concern at this time is finding a place to live. 04-15-2022: Education and support given.  Reviewed medications with patient and discussed importance of medication adherence. 04-15-2022: States compliance with medications at this time. Will continue to monitor.  States the new medication he started when seeing pcp is helping with control of his DM.       Reviewed prescribed diet with patient heart healthy/ADA diet. 04-15-2022: The patient is unable to eat healthy at times due to his current homeless situation. Getting meals from the church [employer]. He is staying hydrated and is drinking a drink called [employer] that he likes that is helping with his hydration. Denies any acute findings today. Education and support given; Counseled on importance of regular laboratory monitoring as prescribed. 04-15-2022: Has regular lab work- most recent on 03-31-2022;        Discussed plans with patient for ongoing care management follow up and provided patient with direct contact information for care management team;      Provided patient with written educational materials related to hypo and hyperglycemia and importance of correct treatment. 11-09-2021: The patient states that his blood sugars have been good. Denies any lows or highs. Has been going to the bathroom and peeing a lot but denies blood sugars being high. Wants to talk to the pcp on Friday concerning this.  03-30-2022: Unable to obtain blood sugar readings at this time. The patient is homeless and this is his main  concern. 04-15-2022: The patient has not been checking regularly but is hopeful to get back on his regular schedule soon. States last reading he remembers is 130. Education and support given.      Reviewed scheduled/upcoming provider appointments including: October 2023 Advised patient, providing education and rationale, to check cbg before meals and at bedtime, when you have symptoms of low or high blood sugar, and before and after exercise and record. 04-15-2022: Education and support given. The patient has not been checking regularly but states he is going to get back to checking his blood sugars BID. Last time he checked it was 130.  Reviewed fasting blood sugars of <130 and post prandial of <180.       call provider for findings outside established parameters;       Review of patient status, including review of consultants reports, relevant laboratory and other test results, and medications completed;       Advised patient to discuss changes in DM, questions  or concerns with provider;   Last eye exam 6 months ago. Is going to call for an appointment soon due to a cataract that needs to be removed (11-09-2021)      Falls:  (Status: Goal on Track (progressing): YES.) Long Term Goal  Provided written and verbal education re: potential causes of falls and Fall prevention strategies Reviewed medications and discussed potential side effects of medications such as dizziness and frequent urination Advised patient of importance of notifying provider of falls. 11-09-2021: the patient states that he has not had any falls but he has had trouble walking. He is using a cane when he is out in public and a walker when he is at home. He states that he may have to have surgery on his feet. The patient states that if he had his diabetic shoes this would help. He has approval but needs sign off from a MD. Will discuss with pcp at upcoming visit.  Assessed for signs and symptoms of orthostatic hypotension Assessed for falls  since last encounter. 04-15-2022: Denies any recent falls.  Assessed patients knowledge of fall risk prevention secondary to previously provided education Provided patient information for fall alert systems Assessed working status of life alert bracelet and patient adherence Advised patient to discuss diabetic shoes and getting the pcp to sign off so the patient can get his diabetic shoes.  with provider   Depression   (Status: Goal on Track (progressing): YES.) Long Term Goal  Evaluation of current treatment plan related to Depression, Mental Health Concerns  self-management and patient's adherence to plan as established by provider. 11-09-2021: The patient is doing well. He has a "lady friend" that respects him and is good to him. They do a lot of things together and he enjoys her company. 03-30-2022: The patient is currently homeless. Left his other apartment and moved into a new one but did not know that it had mold in it and it was causing him to have breathing difficulties. He has been living in his car. Discussed today he and his friend finding a place to live and he states that she is not working at all right now either. The patient has been living out of his car and taking showers and getting meals at [employer] on [address] in [place], [place] [address]. Collaboration with the LCSW. Sending information to the office staff at [facility] to be printed off and given to the patient at his visit with the pcp tomorrow. Empathetic listening and support given. 04-15-2022: The patient is optimistic and hopeful that he will move into a new place soon. The house he has looked at he has placed a deposit on and will know the end of this week or next week. For now he is still living in his car. Reflective listening and support given. Will continue to monitor.  Discussed plans with patient for ongoing care management follow up and provided patient with direct contact information for care management team Advised patient  to call the office for changes in mood, anxiety, or depression, or any new concerns related to mental health and well being ; Provided education to patient re: importance of mental health wellness and well being. Working with the CCM team to effectively manage mental health and well being; Reviewed medications with patient and discussed compliance. 04-15-2022: States compliance with medications; Provided patient with stress reduction educational materials related to depression and increased anxiety; Reviewed scheduled/upcoming provider appointments including 07-2022 Discussed plans with patient for ongoing  care management follow up and provided patient with direct contact information for care management team; Advised patient to discuss new mental health concerns with provider;   Hyperlipidemia:  (Status: Goal on Track (progressing): YES.) Long Term Goal       Lab Results  Component Value Date    CHOL 160 11/13/2021    HDL 39 (L) 11/13/2021    LDLCALC 87 11/13/2021    TRIG 258 (H) 11/13/2021    CHOLHDL 4.1 11/13/2021      Medication review performed; medication list updated in electronic medical record. 04-15-2022: The patient is taking ezetimibe 10 mg daily and atorvastatin 10 mg QD. Provider established cholesterol goals reviewed; Counseled on importance of regular laboratory monitoring as prescribed. 04-15-2022: The patient has regular lab testing; Provided HLD educational materials; Reviewed role and benefits of statin for ASCVD risk reduction; Discussed strategies to manage statin-induced myalgias; Reviewed importance of limiting foods high in cholesterol;   Hypertension: (Status: Goal on Track (progressing): YES.) Long Term Goal  Last practice recorded BP readings:     BP Readings from Last 3 Encounters:  04/09/22 110/63  04/01/22 130/68  03/31/22 126/74  Most recent eGFR/CrCl:       Lab Results  Component Value Date    EGFR 41 (L) 11/13/2021    No components found for:  CRCL   Evaluation of current treatment plan related to hypertension self management and patient's adherence to plan as established by provider. 03-30-2022: The patient was in the ER recently for issues and concerns related to his chronic health conditions and now status of being homeless. Education and support given. Will follow up with the pcp tomorrow. 04-15-2022: The patient was in the ER again last week. States he is doing better now. The patient having stable blood pressure readings. Education and support given. ;   Provided education to patient re: stroke prevention, s/s of heart attack and stroke; Reviewed prescribed diet heart healthy/ADA diet. 04-15-2022: Not having consistent meals. Education on monitoring dietary intake.  Reviewed medications with patient and discussed importance of compliance. 04-15-2022: Education and support given. States that he is compliant with medications;  Discussed plans with patient for ongoing care management follow up and provided patient with direct contact information for care management team; Advised patient, providing education and rationale, to monitor blood pressure daily and record, calling PCP for findings outside established parameters;  Advised patient to discuss blood pressure trends and heart health with provider; Provided education on prescribed diet heart healthy/ADA diet ;  Discussed complications of poorly controlled blood pressure such as heart disease, stroke, circulatory complications, vision complications, kidney impairment, sexual dysfunction;    Pain:  (Status: Goal on Track (progressing): YES.) Long Term Goal  Pain assessment performed. 11-09-2021: The patient rates his osteoarthritis pain at a 7 today on a scale of 0-10. States it was a 9 yesterday. He feels if he had his diabetic shoes this would help a lot. Will discuss with pcp on visit on Friday. 04-15-2022: Denies pain today. Will continue to monitor. Medications reviewed. 04-15-2022: States  compliance with medications. Will continue to monitor.  Reviewed provider established plan for pain management; Discussed importance of adherence to all scheduled medical appointments; Counseled on the importance of reporting any/all new or changed pain symptoms or management strategies to pain management provider; Advised patient to report to care team affect of pain on daily activities; Discussed use of relaxation techniques and/or diversional activities to assist with pain reduction (distraction, imagery, relaxation, massage, acupressure, TENS,  heat, and cold application; Reviewed with patient prescribed pharmacological and nonpharmacological pain relief strategies; Advised patient to discuss unresolved pain, changes in level or intensity of pain with provider;  Our next appointment is by telephone on 05-24-2022 at 345 pm  Please call the care guide team at [phone] if you need to cancel or reschedule your appointment.   If you are experiencing a Mental Health or [place] or need someone to talk to, please call the Suicide and Crisis Lifeline: 988 call the [place] National Suicide Prevention Lifeline: (562) 174-7318 or TTY: (401)870-2132 TTY 857-419-8405) to talk to a trained counselor call 1-800-273-TALK (toll free, 24 hour hotline)   The patient verbalized understanding of instructions, educational materials, and care plan provided today and DECLINED offer to receive copy of patient instructions, educational materials, and care plan.   Noreene Larsson RN, MSN, [place] Mobile: [phone]

## 2022-04-15 NOTE — Chronic Care Management (AMB) (Signed)
Chronic Care Management   CCM RN Visit Note  04/15/2022 Name: Nathaniel Little MRN: [id number] DOB: [birth year]  Subjective: Nathaniel Little is a 76 y.o. year old male who is a primary care patient of Nathaniel Fenton, NP. The care management team was consulted for assistance with disease management and care coordination needs.    Engaged with patient by telephone for follow up visit in response to provider referral for case management and/or care coordination services.   Consent to Services:  The patient was given information about Chronic Care Management services, agreed to services, and gave verbal consent prior to initiation of services.  Please see initial visit note for detailed documentation.   Patient agreed to services and verbal consent obtained.   Assessment: Review of patient past medical history, allergies, medications, health status, including review of consultants reports, laboratory and other test data, was performed as part of comprehensive evaluation and provision of chronic care management services.   SDOH (Social Determinants of Health) assessments and interventions performed:    CCM Care Plan  Allergies  Allergen Reactions   Codeine Itching    Outpatient Encounter Medications as of 04/15/2022  Medication Sig   albuterol (VENTOLIN HFA) 108 (90 Base) MCG/ACT inhaler Inhale 2 puffs into the lungs every 6 (six) hours as needed for wheezing or shortness of breath.   aspirin EC 81 MG tablet Take 81 mg by mouth daily.   atorvastatin (LIPITOR) 10 MG tablet Take 1 tablet (10 mg total) by mouth daily.   BREZTRI AEROSPHERE 160-9-4.8 MCG/ACT AERO Inhale 2 puffs into the lungs 2 (two) times daily.   calcium-vitamin D (OSCAL WITH D) 250-125 MG-UNIT tablet Take 1 tablet by mouth daily.   carvedilol (COREG) 6.25 MG tablet Take 1 tablet (6.25 mg total) by mouth 2 (two) times daily with a meal.   cloNIDine (CATAPRES) 0.1 MG tablet Take 1 tablet (0.1 mg total) by mouth 2 (two) times  daily.   ezetimibe (ZETIA) 10 MG tablet Take 1 tablet (10 mg total) by mouth daily.   fluticasone (FLONASE) 50 MCG/ACT nasal spray    gabapentin (NEURONTIN) 100 MG capsule TAKE 1-2 CAPSULES BY MOUTH AT BEDTIME   glipiZIDE (GLUCOTROL) 5 MG tablet Take 1 tablet (5 mg total) by mouth 2 (two) times daily before a meal.   glucose blood (ACCU-CHEK GUIDE) test strip Check blood sugar twice daily   ipratropium-albuterol (DUONEB) 0.5-2.5 (3) MG/3ML SOLN Take 3 mLs by nebulization every 6 (six) hours as needed (Asthma).   lisinopril (ZESTRIL) 5 MG tablet Take 1 tablet (5 mg total) by mouth daily.   metFORMIN (GLUCOPHAGE) 500 MG tablet Take 1,000 mg by mouth in the morning and at bedtime.   pantoprazole (PROTONIX) 40 MG tablet TAKE 1 TABLET BY MOUTH TWICE DAILY   potassium chloride (KLOR-CON) 10 MEQ tablet Take 10 mEq by mouth daily.   sennosides-docusate sodium (SENOKOT-S) 8.6-50 MG tablet Take 1 tablet by mouth in the morning and at bedtime.   torsemide (DEMADEX) 10 MG tablet Take 1 tablet (10 mg total) by mouth daily.   No facility-administered encounter medications on file as of 04/15/2022.    Patient Active Problem List   Diagnosis Date Noted   CHF (congestive heart failure) ([place]) 03/31/2022   Senile purpura ([place]) 03/31/2022   Chronic kidney disease 12/08/2021   Anemia 12/08/2021   Chronic respiratory failure with hypoxia ([place]) 06/26/2021   Overweight with body mass index (BMI) of 27 to 27.9 in adult 05/22/2021   Iron deficiency anemia  01/20/2021   Insomnia 01/20/2021   Malignant neoplasm of lower lobe of left lung ([place]) 12/14/2020   DM type 2 with diabetic peripheral neuropathy ([place]) 06/13/2020   Tremor 06/13/2020   Erectile dysfunction due to arterial insufficiency 09/24/2019   Lumbar degenerative disc disease 06/25/2019   Severe aortic stenosis    Hyperlipidemia 07/20/2018   Claudication in peripheral vascular disease ([place]) 08/22/2017   Aortic atherosclerosis ([place]) 07/25/2017    Chronic obstructive pulmonary disease ([place]) 01/07/2017   GERD (gastroesophageal reflux disease) 01/07/2017   Essential hypertension 01/07/2017   Sleep apnea with use of continuous positive airway pressure (CPAP) 01/06/2017    Conditions to be addressed/monitored:CHF, HTN, HLD, COPD, DMII, Depression, and Chronic pain , falls, homelessness   Care Plan : RNCM: General Plan of Care (Adult) for Chronic Disease Management and Care Coordination Needs  Updates made by Nathaniel Ingles, RN since 04/15/2022 12:00 AM     Problem: RNCM: Development of plan of care for Chronic Disease Management (CHF, HTN, HLD, COPD, DM, Depression, Chronic Pain, Falls)   Priority: High     Long-Range Goal: RNCM: Effectove Management  of plan of care for Chronic Disease Management (CHF, HTN, HLD, COPD, DM, Depression, Chronic Pain, Falls)   Start Date: 11/09/2021  Expected End Date: 11/09/2022  Priority: High  Note:   Current Barriers:  Knowledge Deficits related to plan of care for management of CHF, HTN, HLD, COPD, DMII, Chronic Pain, and Depression and Falls Precautions  Care Coordination needs related to Mental Health Concerns   Chronic Disease Management support and education needs related to CHF, HTN, HLD, COPD, DMII, Chronic Pain, and depression, falls prevention 03-30-2022: The patient is now homeless and living in his car. Congregational nurse trying to help the patient find a place for him to live and resources sent to office staff to print off for the patient and give to the patient for appointment with pcp on 03-31-2022. 04-15-2022: The patient has placed a deposit of $100.00 down for a home and will find out the end of this week or next week if he will get it. He can afford it and is hopeful that he will be able to get it and move in soon.   RNCM Clinical Goal(s):  Patient will verbalize understanding of plan for management of CHF, HTN, HLD, COPD, DMII, Depression, Osteoarthritis, and Falls  as evidenced by  keeping appointments, following the plan of care, and working with the CCM team to optimize health and wellbeing  take all medications exactly as prescribed and will call provider for medication related questions as evidenced by compliance with medications and calling for refills before running out of medications    attend all scheduled medical appointments: with pcp and specialist as evidenced by keeping appointments and calling for schedule change needs.        demonstrate improved and ongoing adherence to prescribed treatment plan for CHF, HTN, HLD, COPD, DMII, Depression, Osteoarthritis, and falls prevention as evidenced by VS stable, routine lab work, keeping appointments, A1C WNL, Pain controlled, and no acute exacerbations of CHF or COPD. continue to work with Consulting civil engineer and/or Social Worker to address care management and care coordination needs related to CHF, HTN, HLD, COPD, DMII, Depression, Osteoarthritis, and falls prevention  as evidenced by adherence to CM Team Scheduled appointments     work with pharmacist to address medications, medication reconciliation, and management  related to CHF, HTN, HLD, COPD, DMII, Depression, Osteoarthritis, and falls prevention as evidenced by  review of EMR and patient or pharmacist report    demonstrate ongoing self health care management ability effective management of chronic conditions as evidenced by working with CCM team through collaboration with Consulting civil engineer, provider, and care team.   Interventions: 1:1 collaboration with primary care provider regarding development and update of comprehensive plan of care as evidenced by provider attestation and co-signature Inter-disciplinary care team collaboration (see longitudinal plan of care) Evaluation of current treatment plan related to  self management and patient's adherence to plan as established by provider   Heart Failure Interventions:  (Status: Goal on Track (progressing): YES.)  Long Term  Goal  Wt Readings from Last 3 Encounters:  04/08/22 189 lb (85.7 kg)  03/31/22 183 lb (83 kg)  03/25/22 185 lb (83.9 kg)   Basic overview and discussion of pathophysiology of Heart Failure reviewed. 03-30-2022: The patient is currently not able to weigh daily due to being homeless. Is trying to find stable housing. 04-15-2022: The patient is unable to weigh daily but has had a couple trips to the ER recently due to shortness of breath. Was having some swelling in his left leg but he states that is better. He was there last week and his weight was stable and he checked out okay. He is still homeless but is hopeful to get stable housing soon as he has found a place and put a deposit down.  Provided education on low sodium diet. 03-30-2022: The patient is getting meals at a church about 4 days a week. Education and support given. 04-15-2022: Continues to get assistance with meals at a church. Is staying hydrated and drinking a drink he likes called [employer] Reviewed Heart Failure Action Plan in depth and provided written copy Assessed need for readable accurate scales in home. 04-15-2022: Currently homeless. The patient unable to weigh daily Provided education about placing scale on hard, flat surface Advised patient to weigh each morning after emptying bladder Discussed importance of daily weight and advised patient to weigh and record daily Reviewed role of diuretics in prevention of fluid overload and management of heart failure Discussed the importance of keeping all appointments with provider Provided patient with education about the role of exercise in the management of heart failure Advised patient to discuss changes in water weight and exacerbations of heart failure with provider. 04-15-2022: Education and support given. The patient is monitoring for changes in his weight. The patient states that he cannot weigh daily but knows what to look for for changes Screening for signs and symptoms of  depression related to chronic disease state  Assessed social determinant of health barriers  COPD: (Status: Goal on Track (progressing): YES.) Long Term Goal  Reviewed medications with patient, including use of prescribed maintenance and rescue inhalers, and provided instruction on medication management and the importance of adherence. 04-15-2022: The patient is compliant with medications.  Provided patient with basic written and verbal COPD education on self care/management/and exacerbation prevention. 03-30-2022: The patient moved to a new place and states that he could not stay there as there was mold in the home and it was impacting his breathing. The patient is currently homeless and living in his car. He does go to [employer] [facility] [employer] in [place] and takes a shower and gets meals. Education and support given. 04-15-2022: The patient was in the ER on 04-09-2022 for Shortness of breath. The patient states he feels it was a combination of things and he also is  still living in his car. He is hopeful to have housing soon. Denies any acute distress today.  Advised patient to track and manage COPD triggers. 11-09-2021: Review of triggers and education given. The patient was in the ER in January for exacerbation of COPD. The patient states he is doing fine now. Is coughing up a lot of clear sputum. Education on weather changes and sinus concerns and to discuss with pcp. 04-15-2022: Review of triggers that cause issues and concerns for exacerbations Provided written and verbal instructions on pursed lip breathing and utilized returned demonstration as teach back Provided instruction about proper use of medications used for management of COPD including inhalers Advised patient to self assesses COPD action plan zone and make appointment with provider if in the yellow zone for 48 hours without improvement Advised patient to engage in light exercise as tolerated 3-5 days a week to aid in the the management  of COPD Provided education about and advised patient to utilize infection prevention strategies to reduce risk of respiratory infection. 03-30-2022: Denies any recent respiratory infections or other concerns. Will continue to monitor. States his breathing is stable at this time. Discussed the importance of adequate rest and management of fatigue with COPD  Diabetes:  (Status: Goal on Track (progressing): YES.) Long Term Goal   Lab Results  Component Value Date   HGBA1C 8.2 (A) 03/31/2022  Assessed patient's understanding of A1c goal: <7%. 03-30-2022: The patient is above goal. Education and support given. 04-15-2022: The patient remains above goal but is trending downward. The patient was started on new medications and he says this is helping.  Provided education to patient about basic DM disease process. 03-10-2022: Review of DM and how to effectively manage. The patient is homeless at this time and is not eating his usual meals. He gets meals at the church about 4 days a week. The other 3 days he has to go without or eats whatever is on hand. The patient knows how to manage his DM but main concern at this time is finding a place to live. 04-15-2022: Education and support given.  Reviewed medications with patient and discussed importance of medication adherence. 04-15-2022: States compliance with medications at this time. Will continue to monitor.  States the new medication he started when seeing pcp is helping with control of his DM.       Reviewed prescribed diet with patient heart healthy/ADA diet. 04-15-2022: The patient is unable to eat healthy at times due to his current homeless situation. Getting meals from the church [employer]. He is staying hydrated and is drinking a drink called [employer] that he likes that is helping with his hydration. Denies any acute findings today. Education and support given; Counseled on importance of regular laboratory monitoring as prescribed. 04-15-2022: Has regular  lab work- most recent on 03-31-2022;        Discussed plans with patient for ongoing care management follow up and provided patient with direct contact information for care management team;      Provided patient with written educational materials related to hypo and hyperglycemia and importance of correct treatment. 11-09-2021: The patient states that his blood sugars have been good. Denies any lows or highs. Has been going to the bathroom and peeing a lot but denies blood sugars being high. Wants to talk to the pcp on Friday concerning this.  03-30-2022: Unable to obtain blood sugar readings at this time. The patient is homeless and this is his main concern.  04-15-2022: The patient has not been checking regularly but is hopeful to get back on his regular schedule soon. States last reading he remembers is 130. Education and support given.      Reviewed scheduled/upcoming provider appointments including: October 2023 Advised patient, providing education and rationale, to check cbg before meals and at bedtime, when you have symptoms of low or high blood sugar, and before and after exercise and record. 04-15-2022: Education and support given. The patient has not been checking regularly but states he is going to get back to checking his blood sugars BID. Last time he checked it was 130.  Reviewed fasting blood sugars of <130 and post prandial of <180.       call provider for findings outside established parameters;       Review of patient status, including review of consultants reports, relevant laboratory and other test results, and medications completed;       Advised patient to discuss changes in DM, questions or concerns with provider;   Last eye exam 6 months ago. Is going to call for an appointment soon due to a cataract that needs to be removed (11-09-2021)     Falls:  (Status: Goal on Track (progressing): YES.) Long Term Goal  Provided written and verbal education re: potential causes of falls and Fall  prevention strategies Reviewed medications and discussed potential side effects of medications such as dizziness and frequent urination Advised patient of importance of notifying provider of falls. 11-09-2021: the patient states that he has not had any falls but he has had trouble walking. He is using a cane when he is out in public and a walker when he is at home. He states that he may have to have surgery on his feet. The patient states that if he had his diabetic shoes this would help. He has approval but needs sign off from a MD. Will discuss with pcp at upcoming visit.  Assessed for signs and symptoms of orthostatic hypotension Assessed for falls since last encounter. 04-15-2022: Denies any recent falls.  Assessed patients knowledge of fall risk prevention secondary to previously provided education Provided patient information for fall alert systems Assessed working status of life alert bracelet and patient adherence Advised patient to discuss diabetic shoes and getting the pcp to sign off so the patient can get his diabetic shoes.  with provider  Depression   (Status: Goal on Track (progressing): YES.) Long Term Goal  Evaluation of current treatment plan related to Depression, Mental Health Concerns  self-management and patient's adherence to plan as established by provider. 11-09-2021: The patient is doing well. He has a "lady friend" that respects him and is good to him. They do a lot of things together and he enjoys her company. 03-30-2022: The patient is currently homeless. Left his other apartment and moved into a new one but did not know that it had mold in it and it was causing him to have breathing difficulties. He has been living in his car. Discussed today he and his friend finding a place to live and he states that she is not working at all right now either. The patient has been living out of his car and taking showers and getting meals at [employer] on [address] in [place], [place]  [id number]. Collaboration with the LCSW. Sending information to the office staff at [facility] to be printed off and given to the patient at his visit with the pcp tomorrow. Empathetic listening and support given.  04-15-2022: The patient is optimistic and hopeful that he will move into a new place soon. The house he has looked at he has placed a deposit on and will know the end of this week or next week. For now he is still living in his car. Reflective listening and support given. Will continue to monitor.  Discussed plans with patient for ongoing care management follow up and provided patient with direct contact information for care management team Advised patient to call the office for changes in mood, anxiety, or depression, or any new concerns related to mental health and well being ; Provided education to patient re: importance of mental health wellness and well being. Working with the CCM team to effectively manage mental health and well being; Reviewed medications with patient and discussed compliance. 04-15-2022: States compliance with medications; Provided patient with stress reduction educational materials related to depression and increased anxiety; Reviewed scheduled/upcoming provider appointments including 07-2022 Discussed plans with patient for ongoing care management follow up and provided patient with direct contact information for care management team; Advised patient to discuss new mental health concerns with provider;  Hyperlipidemia:  (Status: Goal on Track (progressing): YES.) Long Term Goal  Lab Results  Component Value Date   CHOL 160 11/13/2021   HDL 39 (L) 11/13/2021   LDLCALC 87 11/13/2021   TRIG 258 (H) 11/13/2021   CHOLHDL 4.1 11/13/2021     Medication review performed; medication list updated in electronic medical record. 04-15-2022: The patient is taking ezetimibe 10 mg daily and atorvastatin 10 mg QD. Provider established cholesterol goals reviewed; Counseled on importance of  regular laboratory monitoring as prescribed. 04-15-2022: The patient has regular lab testing; Provided HLD educational materials; Reviewed role and benefits of statin for ASCVD risk reduction; Discussed strategies to manage statin-induced myalgias; Reviewed importance of limiting foods high in cholesterol;  Hypertension: (Status: Goal on Track (progressing): YES.) Long Term Goal  Last practice recorded BP readings:  BP Readings from Last 3 Encounters:  04/09/22 110/63  04/01/22 130/68  03/31/22 126/74  Most recent eGFR/CrCl:  Lab Results  Component Value Date   EGFR 41 (L) 11/13/2021    No components found for: CRCL  Evaluation of current treatment plan related to hypertension self management and patient's adherence to plan as established by provider. 03-30-2022: The patient was in the ER recently for issues and concerns related to his chronic health conditions and now status of being homeless. Education and support given. Will follow up with the pcp tomorrow. 04-15-2022: The patient was in the ER again last week. States he is doing better now. The patient having stable blood pressure readings. Education and support given. ;   Provided education to patient re: stroke prevention, s/s of heart attack and stroke; Reviewed prescribed diet heart healthy/ADA diet. 04-15-2022: Not having consistent meals. Education on monitoring dietary intake.  Reviewed medications with patient and discussed importance of compliance. 04-15-2022: Education and support given. States that he is compliant with medications;  Discussed plans with patient for ongoing care management follow up and provided patient with direct contact information for care management team; Advised patient, providing education and rationale, to monitor blood pressure daily and record, calling PCP for findings outside established parameters;  Advised patient to discuss blood pressure trends and heart health with provider; Provided education on  prescribed diet heart healthy/ADA diet ;  Discussed complications of poorly controlled blood pressure such as heart disease, stroke, circulatory complications, vision complications, kidney impairment, sexual dysfunction;   Pain:  (  Status: Goal on Track (progressing): YES.) Long Term Goal  Pain assessment performed. 11-09-2021: The patient rates his osteoarthritis pain at a 7 today on a scale of 0-10. States it was a 9 yesterday. He feels if he had his diabetic shoes this would help a lot. Will discuss with pcp on visit on Friday. 04-15-2022: Denies pain today. Will continue to monitor. Medications reviewed. 04-15-2022: States compliance with medications. Will continue to monitor.  Reviewed provider established plan for pain management; Discussed importance of adherence to all scheduled medical appointments; Counseled on the importance of reporting any/all new or changed pain symptoms or management strategies to pain management provider; Advised patient to report to care team affect of pain on daily activities; Discussed use of relaxation techniques and/or diversional activities to assist with pain reduction (distraction, imagery, relaxation, massage, acupressure, TENS, heat, and cold application; Reviewed with patient prescribed pharmacological and nonpharmacological pain relief strategies; Advised patient to discuss unresolved pain, changes in level or intensity of pain with provider;  Patient Goals/Self-Care Activities: Take medications as prescribed   Attend all scheduled provider appointments Call pharmacy for medication refills 3-7 days in advance of running out of medications Attend church or other social activities Perform all self care activities independently  Perform IADL's (shopping, preparing meals, housekeeping, managing finances) independently Call provider office for new concerns or questions  Work with the social worker to address care coordination needs and will continue to work  with the clinical team to address health care and disease management related needs call the Suicide and Crisis Lifeline: 988 call the [place] National Suicide Prevention Lifeline: 952-074-6004 or TTY: (509)359-1807 TTY (269)852-5370) to talk to a trained counselor call 1-800-273-TALK (toll free, 24 hour hotline) if experiencing a Mental Health or [place]  call office if I gain more than 2 pounds in one day or 5 pounds in one week keep legs up while sitting use salt in moderation watch for swelling in feet, ankles and legs every day weigh myself daily bring diary to all appointments develop a rescue plan follow rescue plan if symptoms flare-up eat more whole grains, fruits and vegetables, lean meats and healthy fats know when to call the doctorfor changes in CHF track symptoms and what helps feel better or worse dress right for the weather, hot or cold schedule appointment with eye doctor check blood sugar at prescribed times: before meals and at bedtime, when you have symptoms of low or high blood sugar, and before and after exercise check feet daily for cuts, sores or redness enter blood sugar readings and medication or insulin into daily log take the blood sugar log to all doctor visits take the blood sugar meter to all doctor visits set goal weight trim toenails straight across drink 6 to 8 glasses of water each day eat fish at least once per week fill half of plate with vegetables limit fast food meals to no more than 1 per week manage portion size prepare main meal at home 3 to 5 days each week read food labels for fat, fiber, carbohydrates and portion size reduce red meat to 2 to 3 times a week keep feet up while sitting wash and dry feet carefully every day wear comfortable, cotton socks wear comfortable, well-fitting shoes - avoid second hand smoke - eliminate smoking in my home - identify and avoid work-related triggers - identify and remove indoor air  pollutants - limit outdoor activity during cold weather - listen for public air quality announcements every day -  do breathing exercises every day - arrange respite care for caregiver - begin a symptom diary - develop a rescue plan - eliminate symptom triggers at home - follow rescue plan if symptoms flare-up - keep follow-up appointments: heart healthy/ADA - use an extra pillow to sleep - develop a new routine to improve sleep - don't eat or exercise right before bedtime - eat healthy/prescribed diet: heart healthy/ADA diet  - get at least 7 to 8 hours of sleep at night - use devices that will help like a cane, sock-puller or reacher - practice relaxation or meditation daily - do breathing exercises every day - do exercises in a comfortable position that makes breathing as easy as possible check blood pressure 3 times per week choose a place to take my blood pressure (home, clinic or office, retail store) write blood pressure results in a log or diary learn about high blood pressure keep a blood pressure log take blood pressure log to all doctor appointments call doctor for signs and symptoms of high blood pressure develop an action plan for high blood pressure keep all doctor appointments take medications for blood pressure exactly as prescribed report new symptoms to your doctor eat more whole grains, fruits and vegetables, lean meats and healthy fats - call for medicine refill 2 or 3 days before it runs out - take all medications exactly as prescribed - call doctor with any symptoms you believe are related to your medicine - call doctor when you experience any new symptoms - go to all doctor appointments as scheduled - adhere to prescribed diet: heart healthy/ADA diet        Plan:Telephone follow up appointment with care management team member scheduled for:  05-24-2022 at 345 pm  [place], MSN, [facility] Mobile: [phone]

## 2022-04-16 NOTE — Addendum Note (Signed)
Addended by: Jearld Fenton on: 04/16/2022 06:58 AM   Modules accepted: Orders

## 2022-04-20 ENCOUNTER — Telehealth: Payer: Medicare Other

## 2022-04-20 ENCOUNTER — Telehealth: Payer: Self-pay | Admitting: Licensed Clinical Social Worker

## 2022-04-20 NOTE — Telephone Encounter (Signed)
    Clinical Social Work  Care Management   Phone Outreach    04/20/2022 Name: Nathaniel Little MRN: [id number] DOB: [birth year]  Nathaniel Little is a 76 y.o. year old male who is a primary care patient of Jearld Fenton, NP .   Reason for referral: [employer] .    2nd unsuccessful telephone outreach attempt.  If unable to reach patient by phone on the 3rd attempt, will discontinue outreach calls but will be available at any time to provide services.   Plan:CCM LCSW will wait for return call. If no return call is received, will make another attempt within a week  Review of patient status, including review of consultants reports, relevant laboratory and other test results, and collaboration with appropriate care team members and the patient's provider was performed as part of comprehensive patient evaluation and provision of care management services.    Christa See, MSW, [place] Care Management [email or web address] Phone [phone] 9:42 AM

## 2022-04-21 DIAGNOSIS — E1142 Type 2 diabetes mellitus with diabetic polyneuropathy: Secondary | ICD-10-CM

## 2022-04-21 LAB — GLUCOSE, POCT (MANUAL RESULT ENTRY): POC Glucose: 208 mg/dl — AB (ref 70–99)

## 2022-04-21 NOTE — Congregational Nurse Program (Signed)
  Dept: [place] Nurse Program Note  Date of Encounter: 04/21/2022 Client to the clinic for vital sigh check. He reports he may have 2 options for housing that his "lady friend" is checking into. He continues living in his car. Edema noted to left leg from calf to toes, +2 pitting. He reports he is taking his medications as directed. He does elevate his feet as much as possible. 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 No other new concerns at this time. Past Medical History: Past Medical History:  Diagnosis Date   (HFpEF) heart failure with preserved ejection fraction ([place])    a. 08/2017 Echo: EF 60-65%, Gr1 DD.   Arthritis    COPD (chronic obstructive pulmonary disease) ([place])    Coronary artery disease 2021   Depression    Diabetes mellitus without complication (HCC)    Hypertension    Incidental pulmonary nodule, greater than or equal to 66mm 03/11/2020   Left lower lobe - suspicious for bronchogenic neoplasm   Lymphedema    Morbid obesity ([place])    Prostate cancer ([place]) 03/2013   ? 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

## 2022-04-22 DIAGNOSIS — I5030 Unspecified diastolic (congestive) heart failure: Secondary | ICD-10-CM | POA: Diagnosis not present

## 2022-04-22 DIAGNOSIS — E1142 Type 2 diabetes mellitus with diabetic polyneuropathy: Secondary | ICD-10-CM

## 2022-04-22 DIAGNOSIS — J449 Chronic obstructive pulmonary disease, unspecified: Secondary | ICD-10-CM | POA: Diagnosis not present

## 2022-04-22 NOTE — Congregational Nurse Program (Signed)
  Dept: [phone]   Congregational Nurse Program Note  Date of Encounter: 04/22/2022  Client came to [employer]. Requested RN to check his blood pressure and blood glucose.  Discussed with client about his diet due to limited food access. He has not eaten today and it is 4: 20pm. Educted on nutrition and diabetes. Had previously seen client on Monday at [facility]. He inquired about symptoms of UTI due to he was having some irritation with urination and pausing while urinating. States he had previous Prostrate Cancer 3 years ago and was concerned about recurrance. RN advised to make an appoint with his PCP for follow up . Today he wtates he has made a n appointment with is PCP on 04-30-22. Blood pressure today 142/72 and blood glucose 114. Non fasting , states he has mostly drank fluids today due to limited foodaccess. He obtained food from this food bank during visit as well.   Past Medical History: Past Medical History:  Diagnosis Date   (HFpEF) heart failure with preserved ejection fraction ([place])    a. 08/2017 Echo: EF 60-65%, Gr1 DD.   Arthritis    COPD (chronic obstructive pulmonary disease) ([place])    Coronary artery disease 2021   Depression    Diabetes mellitus without complication (HCC)    Hypertension    Incidental pulmonary nodule, greater than or equal to 29mm 03/11/2020   Left lower lobe - suspicious for bronchogenic neoplasm   Lymphedema    Morbid obesity ([place])    Prostate cancer ([place]) 03/2013   ? 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 Yes    Location Patient  Served  Not Applicable    Visit Setting Church or Organization    Patient Status Homeless   client is currenlty living in his car   Insurance Medicare;Medicaid    Insurance Referral N/A    Medication N/A    Medical Provider Yes   next apt in 3 months   Screening Referrals N/A    Medical Referral N/A    Medical Appointment Made N/A   confirmed apt at [facility] for 6/13 at 98 am   [place] Insecurities   client is currenlty living in his car, does have food stamps   Transportation N/A   has his own vehicle   Housing/Utilities No permanent housing   client is working on housing with [employer] case Freight forwarder. Currenlty staying in housing w/o heat/air or refrigeration   Interpersonal Safety N/A    Intervention Blood pressure;Support;Educate;Blood glucose    ED Visit Averted N/A    Life-Saving Intervention Made N/A

## 2022-04-24 DIAGNOSIS — I5030 Unspecified diastolic (congestive) heart failure: Secondary | ICD-10-CM | POA: Diagnosis not present

## 2022-04-24 DIAGNOSIS — J449 Chronic obstructive pulmonary disease, unspecified: Secondary | ICD-10-CM | POA: Diagnosis not present

## 2022-04-26 ENCOUNTER — Ambulatory Visit (INDEPENDENT_AMBULATORY_CARE_PROVIDER_SITE_OTHER): Payer: Medicare Other | Admitting: Internal Medicine

## 2022-04-26 ENCOUNTER — Ambulatory Visit
Admission: RE | Admit: 2022-04-26 | Discharge: 2022-04-26 | Disposition: A | Discharge status: Home / Self Care | Payer: Medicare Other | Attending: Internal Medicine | Admitting: Internal Medicine

## 2022-04-26 ENCOUNTER — Encounter: Payer: Self-pay | Admitting: Internal Medicine

## 2022-04-26 ENCOUNTER — Ambulatory Visit
Admission: RE | Admit: 2022-04-26 | Discharge: 2022-04-26 | Disposition: A | Discharge status: Home / Self Care | Payer: Medicare Other | Source: Ambulatory Visit | Attending: Internal Medicine | Admitting: Internal Medicine

## 2022-04-26 VITALS — BP 124/66 | HR 70 | Temp 97.3°F | Wt 196.0 lb

## 2022-04-26 DIAGNOSIS — R350 Frequency of micturition: Secondary | ICD-10-CM | POA: Diagnosis not present

## 2022-04-26 DIAGNOSIS — R300 Dysuria: Secondary | ICD-10-CM | POA: Diagnosis not present

## 2022-04-26 DIAGNOSIS — M533 Sacrococcygeal disorders, not elsewhere classified: Secondary | ICD-10-CM | POA: Diagnosis not present

## 2022-04-26 DIAGNOSIS — M25551 Pain in right hip: Secondary | ICD-10-CM | POA: Diagnosis not present

## 2022-04-26 DIAGNOSIS — M1611 Unilateral primary osteoarthritis, right hip: Secondary | ICD-10-CM | POA: Diagnosis not present

## 2022-04-26 DIAGNOSIS — G8929 Other chronic pain: Secondary | ICD-10-CM | POA: Diagnosis not present

## 2022-04-26 DIAGNOSIS — Z8546 Personal history of malignant neoplasm of prostate: Secondary | ICD-10-CM | POA: Diagnosis not present

## 2022-04-26 LAB — POCT URINALYSIS DIPSTICK
Bilirubin, UA: NEGATIVE
Glucose, UA: NEGATIVE
Ketones, UA: NEGATIVE
Leukocytes, UA: NEGATIVE
Nitrite, UA: NEGATIVE
Protein, UA: NEGATIVE
Spec Grav, UA: 1.02 (ref 1.010–1.025)
Urobilinogen, UA: 0.2 E.U./dL
pH, UA: 5 (ref 5.0–8.0)

## 2022-04-26 MED ORDER — CIPROFLOXACIN HCL 500 MG PO TABS: 500.0000 mg | ORAL_TABLET | Freq: Two times a day (BID) | ORAL | 0 refills | Status: AC

## 2022-04-26 NOTE — Progress Notes (Signed)
Subjective:    Patient ID: Nathaniel Little, male    DOB: [birth year], 76 y.o.   MRN: [id number]  HPI  Patient presents to clinic today with complaint of burning with urination and urinary frequency.  This started 1 week ago. He denies lower abdominal pain, urinary urgency, blood in his urine. He denies testicular pain or swelling. He denies fever, chills, nausea or low back pain. He has not tried anything OTC for this.  He also reports right hip pain.  This has been an ongoing issue that seems to have worsened in the last few weeks.  He reports his right hip "feels like it is separating".  He did have a recent fall on his right side.  He is taking Gabapentin as prescribed but has not taken any additional medications OTC for this.  Review of Systems     Past Medical History:  Diagnosis Date   (HFpEF) heart failure with preserved ejection fraction ([place])    a. 08/2017 Echo: EF 60-65%, Gr1 DD.   Arthritis    COPD (chronic obstructive pulmonary disease) ([place])    Coronary artery disease 2021   Depression    Diabetes mellitus without complication (HCC)    Hypertension    Incidental pulmonary nodule, greater than or equal to 25mm 03/11/2020   Left lower lobe - suspicious for bronchogenic neoplasm   Lymphedema    Morbid obesity ([place])    Prostate cancer ([place]) 03/2013   ? 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 54 g 1   aspirin EC 81 MG tablet Take 81 mg by mouth daily.     atorvastatin (LIPITOR) 10 MG tablet Take 1 tablet  (10 mg total) by mouth daily. 30 tablet 2   BREZTRI AEROSPHERE 160-9-4.8 MCG/ACT AERO Inhale 2 puffs into the lungs 2 (two) times daily. 10.7 g 2   calcium-vitamin D (OSCAL WITH D) 250-125 MG-UNIT tablet Take 1 tablet by mouth daily.     carvedilol (COREG) 6.25 MG tablet Take 1 tablet (6.25 mg total) by mouth 2 (two) times daily with a meal. 180 tablet 3   cloNIDine (CATAPRES) 0.1 MG tablet Take 1 tablet (0.1 mg total) by mouth 2 (two) times daily. 180 tablet 3   ezetimibe (ZETIA) 10 MG tablet Take 1 tablet (10 mg total) by mouth daily. 90 tablet 3   fluticasone (FLONASE) 50 MCG/ACT nasal spray      gabapentin (NEURONTIN) 100 MG capsule TAKE 1-2 CAPSULES BY MOUTH AT BEDTIME 180 capsule 0   glipiZIDE (GLUCOTROL) 5 MG tablet Take 1 tablet (5 mg total) by mouth 2 (two) times daily before a meal. 180 tablet 0   glucose blood (ACCU-CHEK GUIDE) test strip Check blood sugar twice daily 100 each 12   ipratropium-albuterol (DUONEB) 0.5-2.5 (3) MG/3ML SOLN Take 3 mLs by nebulization every 6 (six) hours as needed (Asthma). 360 mL 12   lisinopril (ZESTRIL) 5 MG tablet Take 1 tablet (5 mg total) by  mouth daily. 90 tablet 3   metFORMIN (GLUCOPHAGE) 500 MG tablet Take 1,000 mg by mouth in the morning and at bedtime.     pantoprazole (PROTONIX) 40 MG tablet TAKE 1 TABLET BY MOUTH TWICE DAILY 60 tablet 3   potassium chloride (KLOR-CON) 10 MEQ tablet Take 10 mEq by mouth daily.     sennosides-docusate sodium (SENOKOT-S) 8.6-50 MG tablet Take 1 tablet by mouth in the morning and at bedtime. 180 tablet 0   torsemide (DEMADEX) 10 MG tablet Take 1 tablet (10 mg total) by mouth daily. 30 tablet 6   No current facility-administered medications for this visit.    Allergies  Allergen Reactions   Codeine Itching    Family History  Problem Relation Age of Onset   Breast cancer Mother    Cancer Father        Black Lung   Cerebral palsy Brother    Tuberculosis Paternal Uncle    Prostate cancer Neg Hx    Kidney  cancer Neg Hx    Bladder Cancer Neg Hx     Social History   Socioeconomic History   Marital status: Widowed    Spouse name: Not on file   Number of children: 2   Years of education: Not on file   Highest education level: Not on file  Occupational History   Occupation: retired -Patent attorney  Tobacco Use   Smoking status: Former    Packs/day: 4.00    Years: 68.00    Total pack years: 272.00    Types: Cigarettes    Quit date: 10/04/2021    Years since quitting: 0.5   Smokeless tobacco: Former    Types: Chew, Snuff    Quit date: 2000   Tobacco comments:    1 cigs a day   Vaping Use   Vaping Use: Never used  Substance and Sexual Activity   Alcohol use: Not Currently    Alcohol/week: 0.0 - 1.0 standard drinks of alcohol    Comment: quit 40years ago   Drug use: No   Sexual activity: Not Currently  Other Topics Concern   Not on file  Social History Narrative   Living at [place] Strain: Low Risk  (12/17/2021)   Overall Financial Resource Strain (CARDIA)    Difficulty of Paying Living Expenses: Not hard at all  Food Insecurity: No Food Insecurity (12/17/2021)   Hunger Vital Sign    Worried About Running Out of Food in the Last Year: Never true    [place] in the Last Year: Never true  Transportation Needs: No Transportation Needs (12/17/2021)   PRAPARE - Hydrologist (Medical): No    Lack of Transportation (Non-Medical): No  Physical Activity: Inactive (05/18/2021)   Exercise Vital Sign    Days of Exercise per Week: 0 days    Minutes of Exercise per Session: 0 min  Stress: Stress Concern Present (12/17/2021)   [place]    Feeling of Stress : To some extent  Social Connections: Moderately Isolated (05/18/2021)   Social Connection and Isolation Panel [NHANES]    Frequency of Communication with Friends and  Family: More than three times a week    Frequency of Social Gatherings with Friends and Family: More than three times a week    Attends Religious Services: More than 4 times per year  Active Member of Clubs or Organizations: No    Attends Archivist Meetings: Never    Marital Status: Widowed  Intimate Partner Violence: Not At Risk (12/17/2021)   Humiliation, Afraid, Rape, and Kick questionnaire    Fear of Current or Ex-Partner: No    Emotionally Abused: No    Physically Abused: No    Sexually Abused: No     Constitutional: Denies fever, malaise, fatigue, headache or abrupt weight changes.  Respiratory: Denies difficulty breathing, shortness of breath, cough or sputum production.   Cardiovascular: Denies chest pain, chest tightness, palpitations or swelling in the hands or feet.  Gastrointestinal: Denies abdominal pain, bloating, constipation, diarrhea or blood in the stool.  GU: Patient reports burning with urination, urinary frequency.  Denies urgency, pain with urination, blood in urine, odor or discharge. MSK: Patient reports right hip pain, difficulty with gait.  Denies decrease in range of motion or joint swelling.  No other specific complaints in a complete review of systems (except as listed in HPI above).  Objective:   Physical Exam  BP 124/66 (BP Location: Right Arm, Patient Position: Sitting, Cuff Size: Normal)   Pulse 70   Temp (!) 97.3 F (36.3 C) (Temporal)   Wt 196 lb (88.9 kg)   SpO2 99%   BMI 28.94 kg/m   Wt Readings from Last 3 Encounters:  04/08/22 189 lb (85.7 kg)  03/31/22 183 lb (83 kg)  03/25/22 185 lb (83.9 kg)    General: Appears his stated age,obese, in NAD. Cardiovascular: Normal rate and rhythm. S1,S2 noted.  No murmur, rubs or gallops noted.  Pulmonary/Chest: Normal effort and positive vesicular breath sounds. No respiratory distress. No wheezes, rales or ronchi noted.  Abdomen: Soft and nontender.  Musculoskeletal: Normal  abduction, abduction, flexion and extension of the right hip.  Pain with internal and external rotation of the right hip.  Pain with palpation of the right trochanter.  Gait slow and steady with use of walker. Neurological: Alert and oriented.   BMET    Component Value Date/Time   NA 140 04/08/2022 2119   NA 141 08/03/2021 1323   K 3.5 04/08/2022 2119   CL 108 04/08/2022 2119   CO2 26 04/08/2022 2119   GLUCOSE 121 (H) 04/08/2022 2119   BUN 22 04/08/2022 2119   BUN 24 08/03/2021 1323   CREATININE 1.58 (H) 04/08/2022 2119   CREATININE 1.70 (H) 11/13/2021 1451   CALCIUM 8.7 (L) 04/08/2022 2119   GFRNONAA 45 (L) 04/08/2022 2119   GFRNONAA 70 12/06/2019 1420   GFRAA 55 (L) 11/07/2020 1534   GFRAA 81 12/06/2019 1420    Lipid Panel     Component Value Date/Time   CHOL 131 03/31/2022 0847   CHOL 149 02/08/2020 1228   TRIG 148 03/31/2022 0847   HDL 43 03/31/2022 0847   HDL 38 (L) 02/08/2020 1228   CHOLHDL 3.0 03/31/2022 0847   LDLCALC 65 03/31/2022 0847    CBC    Component Value Date/Time   WBC 9.4 04/08/2022 2119   RBC 4.07 (L) 04/08/2022 2119   HGB 11.4 (L) 04/08/2022 2119   HGB 13.2 07/24/2021 1543   HCT 35.8 (L) 04/08/2022 2119   HCT 38.8 07/24/2021 1543   PLT 230 04/08/2022 2119   PLT 194 07/24/2021 1543   MCV 88.0 04/08/2022 2119   MCV 93 07/24/2021 1543   MCH 28.0 04/08/2022 2119   MCHC 31.8 04/08/2022 2119   RDW 14.3 04/08/2022 2119   RDW 13.4 07/24/2021 1543  LYMPHSABS 1.7 03/21/2022 1952   LYMPHSABS 1.1 02/08/2020 1228   MONOABS 0.7 03/21/2022 1952   EOSABS 0.3 03/21/2022 1952   EOSABS 0.1 02/08/2020 1228   BASOSABS 0.1 03/21/2022 1952   BASOSABS 0.0 02/08/2020 1228    Hgb A1C Lab Results  Component Value Date   HGBA1C 8.2 (A) 03/31/2022           Assessment & Plan:   Urinary Frequency, Burning with Urination, Hx of Prostate Cancer:  Urinalysis: 2+ blood Will send urine culture Push fluids  Chronic Right Hip Pain:  X-ray right hip  today Continue Gabapentin Can take Tylenol 500 mg 3 times daily as needed for pain  We will follow-up after x-ray with further recommendation and treatment plan, RTC in 2 months, follow-up chronic conditions Webb Silversmith, NP

## 2022-04-26 NOTE — Patient Instructions (Signed)
Urinary Tract Infection, Adult A urinary tract infection (UTI) is an infection of any part of the urinary tract. The urinary tract includes: The kidneys. The ureters. The bladder. The urethra. These organs make, store, and get rid of pee (urine) in the body. What are the causes? This infection is caused by germs (bacteria) in your genital area. These germs grow and cause swelling (inflammation) of your urinary tract. What increases the risk? The following factors may make you more likely to develop this condition: Using a small, thin tube (catheter) to drain pee. Not being able to control when you pee or poop (incontinence). Being male. If you are male, these things can increase the risk: Using these methods to prevent pregnancy: A medicine that kills sperm (spermicide). A device that blocks sperm (diaphragm). Having low levels of a male hormone (estrogen). Being pregnant. You are more likely to develop this condition if: You have genes that add to your risk. You are sexually active. You take antibiotic medicines. You have trouble peeing because of: A prostate that is bigger than normal, if you are male. A blockage in the part of your body that drains pee from the bladder. A kidney stone. A nerve condition that affects your bladder. Not getting enough to drink. Not peeing often enough. You have other conditions, such as: Diabetes. A weak disease-fighting system (immune system). Sickle cell disease. Gout. Injury of the spine. What are the signs or symptoms? Symptoms of this condition include: Needing to pee right away. Peeing small amounts often. Pain or burning when peeing. Blood in the pee. Pee that smells bad or not like normal. Trouble peeing. Pee that is cloudy. Fluid coming from the vagina, if you are male. Pain in the belly or lower back. Other symptoms include: Vomiting. Not feeling hungry. Feeling mixed up (confused). This may be the first symptom in  older adults. Being tired and grouchy (irritable). A fever. Watery poop (diarrhea). How is this treated? Taking antibiotic medicine. Taking other medicines. Drinking enough water. In some cases, you may need to see a specialist. Follow these instructions at home:  Medicines Take over-the-counter and prescription medicines only as told by your doctor. If you were prescribed an antibiotic medicine, take it as told by your doctor. Do not stop taking it even if you start to feel better. General instructions Make sure you: Pee until your bladder is empty. Do not hold pee for a long time. Empty your bladder after sex. Wipe from front to back after peeing or pooping if you are a male. Use each tissue one time when you wipe. Drink enough fluid to keep your pee pale yellow. Keep all follow-up visits. Contact a doctor if: You do not get better after 1-2 days. Your symptoms go away and then come back. Get help right away if: You have very bad back pain. You have very bad pain in your lower belly. You have a fever. You have chills. You feeling like you will vomit or you vomit. Summary A urinary tract infection (UTI) is an infection of any part of the urinary tract. This condition is caused by germs in your genital area. There are many risk factors for a UTI. Treatment includes antibiotic medicines. Drink enough fluid to keep your pee pale yellow. This information is not intended to replace advice given to you by your health care provider. Make sure you discuss any questions you have with your health care provider. Document Revised: 05/02/2020 Document Reviewed: 05/02/2020 Elsevier Patient Education    2023 [employer]vier Inc.  

## 2022-04-27 ENCOUNTER — Ambulatory Visit: Payer: Medicare Other | Admitting: Licensed Clinical Social Worker

## 2022-04-27 DIAGNOSIS — Z5900 Homelessness unspecified: Secondary | ICD-10-CM

## 2022-04-27 DIAGNOSIS — E1142 Type 2 diabetes mellitus with diabetic polyneuropathy: Secondary | ICD-10-CM

## 2022-04-27 DIAGNOSIS — F324 Major depressive disorder, single episode, in partial remission: Secondary | ICD-10-CM

## 2022-04-27 LAB — URINE CULTURE
MICRO NUMBER:: 13686260
Result:: NO GROWTH
SPECIMEN QUALITY:: ADEQUATE

## 2022-05-03 DIAGNOSIS — E1142 Type 2 diabetes mellitus with diabetic polyneuropathy: Secondary | ICD-10-CM | POA: Diagnosis not present

## 2022-05-03 DIAGNOSIS — J449 Chronic obstructive pulmonary disease, unspecified: Secondary | ICD-10-CM | POA: Diagnosis not present

## 2022-05-03 DIAGNOSIS — E782 Mixed hyperlipidemia: Secondary | ICD-10-CM

## 2022-05-03 DIAGNOSIS — I5032 Chronic diastolic (congestive) heart failure: Secondary | ICD-10-CM | POA: Diagnosis not present

## 2022-05-03 DIAGNOSIS — J431 Panlobular emphysema: Secondary | ICD-10-CM

## 2022-05-03 DIAGNOSIS — I10 Essential (primary) hypertension: Secondary | ICD-10-CM | POA: Diagnosis not present

## 2022-05-03 DIAGNOSIS — I509 Heart failure, unspecified: Secondary | ICD-10-CM

## 2022-05-03 DIAGNOSIS — F324 Major depressive disorder, single episode, in partial remission: Secondary | ICD-10-CM

## 2022-05-04 DIAGNOSIS — E1142 Type 2 diabetes mellitus with diabetic polyneuropathy: Secondary | ICD-10-CM

## 2022-05-04 LAB — GLUCOSE, POCT (MANUAL RESULT ENTRY): POC Glucose: 204 mg/dl — AB (ref 70–99)

## 2022-05-04 NOTE — Congregational Nurse Program (Signed)
  Dept: [facility] Nurse Program Note  Date of Encounter: 05/04/2022 Client to clinic for vital sign and blood glucose check.. BP 128/64 (BP Location: Left Arm, Patient Position: Sitting, Cuff Size: Normal)   Pulse 76   SpO2 98%  Blood glucose 204, nonfasting. He reports he is taking his oral antidiabetic medications as ordered. He continues living in his car and states he is looking at houses to rent. Support given. No new concerns today.  Past Medical History: Past Medical History:  Diagnosis Date   (HFpEF) heart failure with preserved ejection fraction ([place])    a. 08/2017 Echo: EF 60-65%, Gr1 DD.   Arthritis    COPD (chronic obstructive pulmonary disease) ([place])    Coronary artery disease 2021   Depression    Diabetes mellitus without complication (HCC)    Hypertension    Incidental pulmonary nodule, greater than or equal to 30mm 03/11/2020   Left lower lobe - suspicious for bronchogenic neoplasm   Lymphedema    Morbid obesity ([place])    Prostate cancer ([place]) 03/2013   ? PANCREATIC   S/P TAVR (transcatheter aortic valve replacement) 04/01/2020   s/p TAVR with a 29 mm Edwards Sapien 3 THV via the TF approach by Drs Buena Irish & Roxy Manns   Severe aortic stenosis    Sleep apnea with use of continuous positive airway pressure (CPAP)    Tobacco abuse    Tremor    HEAD    Encounter Details:  CNP Questionnaire - 05/04/22 1030       Questionnaire   Do you give verbal consent to treat you today? Yes    Location Patient Served  Not Applicable    Visit Setting Church or Organization    Patient Status Homeless   client is currenlty living in his car   Insurance Medicare;Medicaid    Insurance Referral N/A    Medication N/A    Medical Provider Yes   next apt in 3 months   Screening Referrals N/A    Medical Referral N/A    Medical Appointment Made N/A   client has an upcoming GI apt at [place] [facility] on 9/13   Food Have Food Insecurities   client is currenlty living  in his car, does have food stamps, utilizes Electrical engineer N/A   has his own vehicle   Housing/Utilities No permanent housing   client is working on housing with [employer] case Freight forwarder. Currenlty staying in housing w/o heat/air or refrigeration   Interpersonal Safety N/A    Intervention Blood pressure;Support;Educate;Blood glucose    ED Visit Averted N/A    Life-Saving Intervention Made N/A

## 2022-05-06 NOTE — Congregational Nurse Program (Signed)
  Dept: [facility] Nurse Program Note  Date of Encounter: 05/06/2022 Client to clinic to advise that he may have a place to live. No new needs or concerns at this time.   Past Medical History: Past Medical History:  Diagnosis Date   (HFpEF) heart failure with preserved ejection fraction ([place])    a. 08/2017 Echo: EF 60-65%, Gr1 DD.   Arthritis    COPD (chronic obstructive pulmonary disease) ([place])    Coronary artery disease 2021   Depression    Diabetes mellitus without complication (HCC)    Hypertension    Incidental pulmonary nodule, greater than or equal to 48mm 03/11/2020   Left lower lobe - suspicious for bronchogenic neoplasm   Lymphedema    Morbid obesity ([place])    Prostate cancer ([place]) 03/2013   ? PANCREATIC   S/P TAVR (transcatheter aortic valve replacement) 04/01/2020   s/p TAVR with a 29 mm Edwards Sapien 3 THV via the TF approach by Drs Buena Irish & Roxy Manns   Severe aortic stenosis    Sleep apnea with use of continuous positive airway pressure (CPAP)    Tobacco abuse    Tremor    HEAD    Encounter Details:  CNP Questionnaire - 05/06/22 1018       Questionnaire   Do you give verbal consent to treat you today? Yes    Location Patient Served  Not Applicable    Visit Setting Church or Organization    Patient Status Homeless   client is currenlty living in his car   Insurance Medicare;Medicaid    Insurance Referral N/A    Medication N/A    Medical Provider Yes   next apt in 3 months   Screening Referrals N/A    Medical Referral N/A    Medical Appointment Made N/A   client has an upcoming GI apt at [facility] on 9/13   Food Have Food Insecurities   client is currenlty living in his car, does have food stamps, utilizes Electrical engineer N/A   has his own vehicle   Housing/Utilities No permanent housing   client is working on housing with [employer] case Freight forwarder. Currenlty staying in housing w/o heat/air or refrigeration    Interpersonal Safety N/A    Intervention Support;Educate    ED Visit Averted N/A    Life-Saving Intervention Made N/A

## 2022-05-10 ENCOUNTER — Telehealth: Payer: Self-pay

## 2022-05-10 NOTE — Telephone Encounter (Signed)
Mailing resources   [place], Care Management  [phone] [address], [place], [place] [address] Phone: [phone] Email: [email or web address]

## 2022-05-10 NOTE — Chronic Care Management (AMB) (Signed)
Chronic Care Management    Clinical Social Work Note  05/10/2022 Name: Nathaniel Little MRN: [id number] DOB: 03-24-1946  Nathaniel Little is a 76 y.o. year old male who is a primary care patient of Jearld Fenton, NP. The CCM team was consulted to assist the patient with chronic disease management and/or care coordination needs related to: [employer]  and [employer].   Engaged with patient by telephone for initial visit in response to provider referral for social work chronic care management and care coordination services.   Consent to Services:  The patient was given the following information about Chronic Care Management services today, agreed to services, and gave verbal consent: 1. CCM service includes personalized support from designated clinical staff supervised by the primary care provider, including individualized plan of care and coordination with other care providers 2. 24/7 contact phone numbers for assistance for urgent and routine care needs. 3. Service will only be billed when office clinical staff spend 20 minutes or more in a month to coordinate care. 4. Only one practitioner may furnish and bill the service in a calendar month. 5.The patient may stop CCM services at any time (effective at the end of the month) by phone call to the office staff. 6. The patient will be responsible for cost sharing (co-pay) of up to 20% of the service fee (after annual deductible is met). Patient agreed to services and consent obtained.  Patient agreed to services and consent obtained.   Assessment: Review of patient past medical history, allergies, medications, and health status, including review of relevant consultants reports was performed today as part of a comprehensive evaluation and provision of chronic care management and care coordination services.     SDOH (Social Determinants of Health) assessments and interventions performed:    Advanced Directives Status: Not addressed in this  encounter.  CCM Care Plan  Allergies  Allergen Reactions   Codeine Itching    Outpatient Encounter Medications as of 04/27/2022  Medication Sig   albuterol (VENTOLIN HFA) 108 (90 Base) MCG/ACT inhaler Inhale 2 puffs into the lungs every 6 (six) hours as needed for wheezing or shortness of breath.   aspirin EC 81 MG tablet Take 81 mg by mouth daily.   atorvastatin (LIPITOR) 10 MG tablet Take 1 tablet (10 mg total) by mouth daily.   BREZTRI AEROSPHERE 160-9-4.8 MCG/ACT AERO Inhale 2 puffs into the lungs 2 (two) times daily.   calcium-vitamin D (OSCAL WITH D) 250-125 MG-UNIT tablet Take 1 tablet by mouth daily.   carvedilol (COREG) 6.25 MG tablet Take 1 tablet (6.25 mg total) by mouth 2 (two) times daily with a meal.   [EXPIRED] ciprofloxacin (CIPRO) 500 MG tablet Take 1 tablet (500 mg total) by mouth 2 (two) times daily for 7 days.   cloNIDine (CATAPRES) 0.1 MG tablet Take 1 tablet (0.1 mg total) by mouth 2 (two) times daily.   ezetimibe (ZETIA) 10 MG tablet Take 1 tablet (10 mg total) by mouth daily.   fluticasone (FLONASE) 50 MCG/ACT nasal spray    gabapentin (NEURONTIN) 100 MG capsule TAKE 1-2 CAPSULES BY MOUTH AT BEDTIME   glipiZIDE (GLUCOTROL) 5 MG tablet Take 1 tablet (5 mg total) by mouth 2 (two) times daily before a meal.   glucose blood (ACCU-CHEK GUIDE) test strip Check blood sugar twice daily   ipratropium-albuterol (DUONEB) 0.5-2.5 (3) MG/3ML SOLN Take 3 mLs by nebulization every 6 (six) hours as needed (Asthma).   lisinopril (ZESTRIL) 5 MG tablet Take 1 tablet (5  mg total) by mouth daily.   metFORMIN (GLUCOPHAGE) 500 MG tablet Take 1,000 mg by mouth in the morning and at bedtime.   pantoprazole (PROTONIX) 40 MG tablet TAKE 1 TABLET BY MOUTH TWICE DAILY   potassium chloride (KLOR-CON) 10 MEQ tablet Take 10 mEq by mouth daily.   sennosides-docusate sodium (SENOKOT-S) 8.6-50 MG tablet Take 1 tablet by mouth in the morning and at bedtime.   torsemide (DEMADEX) 10 MG tablet Take 1  tablet (10 mg total) by mouth daily.   No facility-administered encounter medications on file as of 04/27/2022.    Patient Active Problem List   Diagnosis Date Noted   CHF (congestive heart failure) ([place]) 03/31/2022   Senile purpura ([place]) 03/31/2022   Chronic kidney disease 12/08/2021   Anemia 12/08/2021   Chronic respiratory failure with hypoxia ([place]) 06/26/2021   Overweight with body mass index (BMI) of 27 to 27.9 in adult 05/22/2021   Iron deficiency anemia 01/20/2021   Insomnia 01/20/2021   Malignant neoplasm of lower lobe of left lung ([place]) 12/14/2020   DM type 2 with diabetic peripheral neuropathy ([place]) 06/13/2020   Tremor 06/13/2020   Erectile dysfunction due to arterial insufficiency 09/24/2019   Lumbar degenerative disc disease 06/25/2019   Severe aortic stenosis    Hyperlipidemia 07/20/2018   Claudication in peripheral vascular disease ([place]) 08/22/2017   Aortic atherosclerosis ([place]) 07/25/2017   Chronic obstructive pulmonary disease ([place]) 01/07/2017   GERD (gastroesophageal reflux disease) 01/07/2017   Essential hypertension 01/07/2017   Sleep apnea with use of continuous positive airway pressure (CPAP) 01/06/2017    Conditions to be addressed/monitored: CHF and HTN; Limited social support, Limited access to food, and Housing barriers  Care Plan : Plan of Care-LCSW  Updates made by Christa See D, LCSW since 05/10/2022 12:00 AM     Problem: Quality of Life (General Plan of Care)      Goal: Quality of Life Maintained Completed 04/27/2022  Start Date: 04/27/2022  This Visit's Progress: On track  Priority: High  Note:   Current barriers:   Limited social support, Limited access to food, and Housing barriers Needs Support, Education, and Care Coordination in order to meet unmet mental health needs. Clinical Goal(s): explore community resource options for unmet needs related to:  Housing  and Food Insecurity   Clinical Interventions:  Assessed patient's previous and  current treatment, coping skills, support system and barriers to care  Pt is homeless and has limited food options due to residing in vehicle (3 months) He is aware of local resources for food assistance, receiving mail, and taking showers Pt receives services from food pantry and funds on his [employer] card Pt endorses med compliance Pt was evicted from [place] check from widow benefits Solution-Focused Strategies employed:  Active listening / Reflection utilized  Emotional Support Provided Verbalization of feelings encouraged  ; Review resources, discussed options and provided patient information about  Housing (Pt is looking for rooms for rent $400-500) Encouraged pt to review options on [email or web address].[email or web address] 1:1 collaboration with primary care provider regarding development and update of comprehensive plan of care as evidenced by provider attestation and co-signature Inter-disciplinary care team collaboration (see longitudinal plan of care) Patient Goals/Self-Care Activities: Over the next 30 days Attend scheduled appts Utilize healthy coping skills and/or resources discussed Contact PCP office with any questions or concerns      Follow Up Plan: LCSW will collaborate with Care Guide regarding follow up.      Christa See,  MSW, [email or web address] Phone [phone] 8:30 AM

## 2022-05-10 NOTE — Telephone Encounter (Signed)
   Telephone encounter was:  Successful.  05/10/2022 Name: Nathaniel Little MRN: [id number] DOB: 09/29/1946  Nathaniel Little is a 76 y.o. year old male who is a primary care patient of Jearld Fenton, NP . The community resource team was consulted for assistance with housing  Care guide performed the following interventions: Patient provided with information about care guide support team and interviewed to confirm resource needs.Homeless for 3 months living in his car I am mailing resources for housing and community resources at patients request  Follow Up Plan:  No further follow up planned at this time. The patient has been provided with needed resources.    [place], Care Management  [phone] [address], [place], [place] [address] Phone: [phone] Email: [email or web address]

## 2022-05-10 NOTE — Patient Instructions (Signed)
Visit Information  Thank you for taking time to visit with me today. Please don't hesitate to contact me if I can be of assistance to you before our next scheduled telephone appointment.  Following are the goals we discussed today:  Patient Goals/Self-Care Activities: Over the next 30 days Attend scheduled appts Utilize healthy coping skills and/or resources discussed Contact PCP office with any questions or concerns  LCSW will collaborate with Care Guide regarding follow up services  Please call the care guide team at [phone] if you need to cancel or reschedule your appointment.   If you are experiencing a Mental Health or [place] or need someone to talk to, please call the Suicide and Crisis Lifeline: 988 call 911   Following is a copy of your full plan of care:  Care Plan : Plan of Care-LCSW  Updates made by Rebekah [place], LCSW since 05/10/2022 12:00 AM     Problem: Quality of Life (General Plan of Care)      Goal: Quality of Life Maintained Completed 04/27/2022  Start Date: 04/27/2022  This Visit's Progress: On track  Priority: High  Note:   Current barriers:   Limited social support, Limited access to food, and Housing barriers Needs Support, Education, and Care Coordination in order to meet unmet mental health needs. Clinical Goal(s): explore community resource options for unmet needs related to:  Housing  and Food Insecurity   Clinical Interventions:  Assessed patient's previous and current treatment, coping skills, support system and barriers to care  Pt is homeless and has limited food options due to residing in vehicle (3 months) He is aware of local resources for food assistance, receiving mail, and taking showers Pt receives services from food pantry and funds on his [employer] card Pt endorses med compliance Pt was evicted from [place] check from widow benefits Solution-Focused Strategies employed:   Active listening / Reflection utilized  Emotional Support Provided Verbalization of feelings encouraged  ; Review resources, discussed options and provided patient information about  Housing (Pt is looking for rooms for rent $400-500) Encouraged pt to review options on [email or web address].[email or web address] 1:1 collaboration with primary care provider regarding development and update of comprehensive plan of care as evidenced by provider attestation and co-signature Inter-disciplinary care team collaboration (see longitudinal plan of care) Patient Goals/Self-Care Activities: Over the next 30 days Attend scheduled appts Utilize healthy coping skills and/or resources discussed Contact PCP office with any questions or concerns     Nathaniel Little was given information about Care Management services by the embedded care coordination team including:  Care Management services include personalized support from designated clinical staff supervised by his physician, including individualized plan of care and coordination with other care providers 24/7 contact phone numbers for assistance for urgent and routine care needs. The patient may stop CCM services at any time (effective at the end of the month) by phone call to the office staff.  Patient agreed to services and verbal consent obtained.   The patient verbalized understanding of instructions, educational materials, and care plan provided today and DECLINED offer to receive copy of patient instructions, educational materials, and care plan.   Christa See, MSW, [facility] Care Management [email or web address] Phone [phone] 8:31 AM

## 2022-05-17 ENCOUNTER — Telehealth: Payer: Self-pay

## 2022-05-17 ENCOUNTER — Ambulatory Visit: Payer: Medicare Other | Admitting: Podiatry

## 2022-05-17 NOTE — Chronic Care Management (AMB) (Signed)
  Care Coordination  Note  05/17/2022 Name: Nathaniel Little MRN: [id number] DOB: 1946/07/03  Nathaniel Little is a 76 y.o. year old male who is a primary care patient of Jearld Fenton, NP. I reached out to [name] by phone today to offer care coordination services.      [name] was given information about Care Coordination services today including:  The Care Coordination services include support from the care team which includes your Nurse Coordinator, Clinical Social Worker, or Pharmacist.  The Care Coordination team is here to help remove barriers to the health concerns and goals most important to you. Care Coordination services are voluntary and the patient may decline or stop services at any time by request to their care team member.   Patient agreed to services and verbal consent obtained.   Follow up plan: Telephone appointment with care coordination team member scheduled for:06/08/2022  Nathaniel Little, [place], Nathaniel Little Direct Dial: [phone] [email or web address]

## 2022-05-23 DIAGNOSIS — J449 Chronic obstructive pulmonary disease, unspecified: Secondary | ICD-10-CM | POA: Diagnosis not present

## 2022-05-23 DIAGNOSIS — I5030 Unspecified diastolic (congestive) heart failure: Secondary | ICD-10-CM | POA: Diagnosis not present

## 2022-05-24 ENCOUNTER — Ambulatory Visit: Payer: Self-pay

## 2022-05-24 ENCOUNTER — Telehealth: Payer: Medicare Other

## 2022-05-24 NOTE — Patient Instructions (Signed)
Visit Information  Thank you for taking time to visit with me today. Please don't hesitate to contact me if I can be of assistance to you.   Following are the goals we discussed today:   Goals Addressed             This Visit's Progress    RNCM: Find stable housing       Care Coordination Interventions: Evaluation of current treatment plan related to homeless and management of chronic conditions and patient's adherence to plan as established by provider. 05-24-2022: The patient states that he is currently staying with friends but this is only temporary. He and his "lady friend" are going out this weekend to see if they can find a place to rent where they can split the cost. The patient states he needs to have surgery on his foot but cannot do this until he has a better place to stay. Education and support given. Advised patient to call the office for changes in condition, questions, or concerns Provided education to patient re: acceptable levels for blood sugars. The patient states when he took his blood sugars it was 97. He wanted to know if this was a good number Reviewed medications with patient and discussed compliance. Patient states he is taking his medications as directed Provided patient with DM and COPD educational materials related to his chronic conditions of COPD and DM Reviewed scheduled/upcoming provider appointments including patient does not have any upcoming appointments with the pcp. He is having issues with his car being broken down. He states as soon as he gets that fixed this weekend he will call and make and appointment.  Social Work referral for ongoing support and education related to being homeless. He has temporary housing but needs a place of his own. Currently staying with friends.  Discussed plans with patient for ongoing care management follow up and provided patient with direct contact information for care management team Advised patient to discuss changes in his  health and well being with provider Screening for signs and symptoms of depression related to chronic disease state  Assessed social determinant of health barriers          Our next appointment is by telephone on 07-12-2022 at 330 pm  Please call the care guide team at [phone] if you need to cancel or reschedule your appointment.   If you are experiencing a Mental Health or [place] or need someone to talk to, please call the Suicide and Crisis Lifeline: 988 call the [place] National Suicide Prevention Lifeline: 309-673-5746 or TTY: 443-631-3172 TTY 581-509-9050) to talk to a trained counselor call 1-800-273-TALK (toll free, 24 hour hotline)  The patient verbalized understanding of instructions, educational materials, and care plan provided today and DECLINED offer to receive copy of patient instructions, educational materials, and care plan.   Telephone follow up appointment with care management team member scheduled for: 07-12-2022 at 330 pm  [place], MSN, [facility] Mobile: [phone]

## 2022-05-24 NOTE — Patient Outreach (Signed)
  Care Coordination   Follow Up Visit Note   05/24/2022 Name: Nathaniel Little MRN: [id number] DOB: 1946-05-27  Nathaniel Little is a 76 y.o. year old male who sees Baity, Coralie Keens, NP for primary care. I spoke with  [name] by phone today  What matters to the patients health and wellness today?  Still trying to find suitable and stable housing    Goals Addressed             This Visit's Progress    RNCM: Find stable housing       Care Coordination Interventions: Evaluation of current treatment plan related to homeless and management of chronic conditions and patient's adherence to plan as established by provider. 05-24-2022: The patient states that he is currently staying with friends but this is only temporary. He and his "lady friend" are going out this weekend to see if they can find a place to rent where they can split the cost. The patient states he needs to have surgery on his foot but cannot do this until he has a better place to stay. Education and support given. Advised patient to call the office for changes in condition, questions, or concerns Provided education to patient re: acceptable levels for blood sugars. The patient states when he took his blood sugars it was 97. He wanted to know if this was a good number Reviewed medications with patient and discussed compliance. Patient states he is taking his medications as directed Provided patient with DM and COPD educational materials related to his chronic conditions of COPD and DM Reviewed scheduled/upcoming provider appointments including patient does not have any upcoming appointments with the pcp. He is having issues with his car being broken down. He states as soon as he gets that fixed this weekend he will call and make and appointment.  Social Work referral for ongoing support and education related to being homeless. He has temporary housing but needs a place of his own. Currently staying with friends.  Discussed plans with  patient for ongoing care management follow up and provided patient with direct contact information for care management team Advised patient to discuss changes in his health and well being with provider Screening for signs and symptoms of depression related to chronic disease state  Assessed social determinant of health barriers          SDOH assessments and interventions completed:  Yes  SDOH Interventions Today    Flowsheet Row Most Recent Value  SDOH Interventions   Food Insecurity Interventions Intervention Not Indicated  Housing Interventions Other (Comment)  [working with RNCM, care guides, and LCSW for help with housing needs]  Transportation Interventions Intervention Not Indicated        Care Coordination Interventions Activated:  Yes  Care Coordination Interventions:  Yes, provided   Follow up plan: Follow up call scheduled for 07-12-2022 at 330 pm    Encounter Outcome:  Pt. Visit Completed   Noreene Larsson RN, MSN, [facility] [facility] Mobile: [phone]

## 2022-05-25 DIAGNOSIS — J449 Chronic obstructive pulmonary disease, unspecified: Secondary | ICD-10-CM | POA: Diagnosis not present

## 2022-05-25 DIAGNOSIS — I5030 Unspecified diastolic (congestive) heart failure: Secondary | ICD-10-CM | POA: Diagnosis not present

## 2022-05-27 ENCOUNTER — Ambulatory Visit: Payer: Medicare Other | Admitting: Podiatry

## 2022-06-08 ENCOUNTER — Ambulatory Visit: Payer: Self-pay | Admitting: *Deleted

## 2022-06-08 NOTE — Patient Instructions (Signed)
Visit Information  Thank you for taking time to visit with me today. Please don't hesitate to contact me if I can be of assistance to you.   Following are the goals we discussed today:   Goals Addressed             This Visit's Progress    Housing resources       Care Coordination Interventions: Patient confirmed currently living with friends and will soon be living in his car(Saturday), would like resources for low income housing. Patient confirms that he has 2 churches that are assisting him with identifying housing Patient not interested in senior housing, applying for low income housing through the housing authority or assisted living as he would like to find a place for himself and his girlfriend who does not meet the eligibility requirements Patient provided with the contact number for the [name] [address] as well as the website for the [employer] This social worker will also send additional housing resources to patient by mail        Our next appointment is by telephone on 06/28/22 at 1pm  Please call the care guide team at [phone] if you need to cancel or reschedule your appointment.   If you are experiencing a Mental Health or [place] or need someone to talk to, please call the Suicide and Crisis Lifeline: 988   Patient verbalizes understanding of instructions and care plan provided today and agrees to view in [place]. Active MyChart status and patient understanding of how to access instructions and care plan via MyChart confirmed with patient.    Telephone follow up appointment with care management team member scheduled for:06/28/22  Elliot Gurney, [place] Worker  [facility] Care Management [phone]

## 2022-06-08 NOTE — Patient Outreach (Signed)
  Care Coordination   Initial Visit Note   06/08/2022 Name: Nathaniel Little MRN: [id number] DOB: [birth year]  Nathaniel Little is a 76 y.o. year old male who sees Baity, Coralie Keens, NP for primary care. I spoke with  Nathaniel Little by phone today.  What matters to the patients health and wellness today?  Housing Resources    Goals Addressed             This Visit's Progress    Housing resources       Care Coordination Interventions: Patient confirmed currently living with friends and will soon be living in his car(Saturday), would like resources for low income housing. Patient confirms that he has 2 churches that are assisting him with identifying housing Patient not interested in senior housing, applying for low income housing through the housing authority or assisted living as he would like to find a place for himself and his girlfriend who does not meet the eligibility requirements Patient provided with the contact number for the [name] [address] as well as the website for the [employer] This Education officer, museum will also send additional housing resources to patient by mail        SDOH assessments and interventions completed:  Yes  SDOH Interventions Today    Flowsheet Row Most Recent Value  SDOH Interventions   Food Insecurity Interventions Intervention Not Indicated  Housing Interventions Intervention Not Indicated  [Housing resources provided]  Transportation Interventions Intervention Not Indicated  [has car, currently being repaired]  Utilities Interventions Intervention Not Indicated        Care Coordination Interventions Activated:  Yes  Care Coordination Interventions:  Yes, provided   Follow up plan: Follow up call scheduled for 06/28/22    Encounter Outcome:  Pt. Visit Completed

## 2022-06-10 ENCOUNTER — Encounter: Payer: Self-pay | Admitting: *Deleted

## 2022-06-10 NOTE — Progress Notes (Signed)
This encounter was created in error - please disregard.

## 2022-06-19 ENCOUNTER — Other Ambulatory Visit: Payer: Self-pay | Admitting: Internal Medicine

## 2022-06-19 DIAGNOSIS — E1142 Type 2 diabetes mellitus with diabetic polyneuropathy: Secondary | ICD-10-CM

## 2022-06-21 NOTE — Telephone Encounter (Signed)
Requested Prescriptions  Pending Prescriptions Disp Refills  . glipiZIDE (GLUCOTROL) 5 MG tablet [Pharmacy Med Name: GLIPIZIDE 5 MG TAB] 180 tablet 0    Sig: TAKE 1 TABLET BY MOUTH TWICE DAILY BEFORE A MEAL     Endocrinology:  Diabetes - Sulfonylureas Failed - 06/19/2022 10:58 AM      Failed - HBA1C is between 0 and 7.9 and within 180 days    Hemoglobin A1C  Date Value Ref Range Status  03/31/2022 8.2 (A) 4.0 - 5.6 % Final   Hgb A1c MFr Bld  Date Value Ref Range Status  05/18/2021 6.6 (H) <5.7 % of total Hgb Final    Comment:    For someone without known diabetes, a hemoglobin A1c value of 6.5% or greater indicates that they may have  diabetes and this should be confirmed with a follow-up  test. . For someone with known diabetes, a value <7% indicates  that their diabetes is well controlled and a value  greater than or equal to 7% indicates suboptimal  control. A1c targets should be individualized based on  duration of diabetes, age, comorbid conditions, and  other considerations. . Currently, no consensus exists regarding use of hemoglobin A1c for diagnosis of diabetes for children. .          Failed - Cr in normal range and within 360 days    Creat  Date Value Ref Range Status  11/13/2021 1.70 (H) 0.70 - 1.28 mg/dL Final   Creatinine, Ser  Date Value Ref Range Status  04/08/2022 1.58 (H) 0.61 - 1.24 mg/dL Final   Creatinine, Urine  Date Value Ref Range Status  11/13/2021 53 20 - 320 mg/dL Final         Passed - Valid encounter within last 6 months    Recent Outpatient Visits          1 month ago Burning with urination   [facility] [place], Coralie Keens, NP   2 months ago Aortic atherosclerosis [facility])   [facility] [place] W, NP   6 months ago DM type 2 with diabetic peripheral neuropathy [facility])   [facility], Devonne Doughty, DO   6 months ago Callus of foot   [facility] [place], Coralie Keens,  NP   7 months ago Encounter for general adult medical examination with abnormal findings   [facility], Coralie Keens, NP      Future Appointments            In 1 week Garnette Gunner, Coralie Keens, NP [facility], [place]   In 2 weeks [place], Coralie Keens, NP [facility], [facility]           . gabapentin (NEURONTIN) 100 MG capsule [Pharmacy Med Name: GABAPENTIN 100 MG CAP] 180 capsule 0    Sig: TAKE 1-2 CAPSULES BY MOUTH AT BEDTIME     Neurology: Anticonvulsants - gabapentin Failed - 06/19/2022 10:58 AM      Failed - Cr in normal range and within 360 days    Creat  Date Value Ref Range Status  11/13/2021 1.70 (H) 0.70 - 1.28 mg/dL Final   Creatinine, Ser  Date Value Ref Range Status  04/08/2022 1.58 (H) 0.61 - 1.24 mg/dL Final   Creatinine, Urine  Date Value Ref Range Status  11/13/2021 53 20 - 320 mg/dL Final         Passed - Completed PHQ-2 or PHQ-9 in  the last 360 days      Passed - Valid encounter within last 12 months    Recent Outpatient Visits          1 month ago Burning with urination   [facility] [place], Coralie Keens, NP   2 months ago Aortic atherosclerosis [facility])   [facility] [place], Coralie Keens, NP   6 months ago DM type 2 with diabetic peripheral neuropathy [facility])   [place], DO   6 months ago Callus of foot   [place], Coralie Keens, NP   7 months ago Encounter for general adult medical examination with abnormal findings   [facility], Coralie Keens, NP      Future Appointments            In 1 week Garnette Gunner, Coralie Keens, NP [facility], [place]   In 2 weeks [place], Coralie Keens, NP [facility], [facility]

## 2022-06-23 DIAGNOSIS — J449 Chronic obstructive pulmonary disease, unspecified: Secondary | ICD-10-CM | POA: Diagnosis not present

## 2022-06-23 DIAGNOSIS — I5030 Unspecified diastolic (congestive) heart failure: Secondary | ICD-10-CM | POA: Diagnosis not present

## 2022-06-25 DIAGNOSIS — I5030 Unspecified diastolic (congestive) heart failure: Secondary | ICD-10-CM | POA: Diagnosis not present

## 2022-06-25 DIAGNOSIS — J449 Chronic obstructive pulmonary disease, unspecified: Secondary | ICD-10-CM | POA: Diagnosis not present

## 2022-06-28 ENCOUNTER — Encounter: Payer: Self-pay | Admitting: *Deleted

## 2022-06-28 ENCOUNTER — Telehealth: Payer: Self-pay | Admitting: *Deleted

## 2022-06-28 NOTE — Patient Outreach (Signed)
  Care Coordination   Follow Up Visit Note   06/28/2022 Name: Nathaniel Little MRN: [id number] DOB: [birth year]  Nathaniel Little is a 76 y.o. year old male who sees Baity, Coralie Keens, NP for primary care. I spoke with  Nathaniel Little by phone today.  What matters to the patients health and wellness today?  Housing    Goals Addressed             This Visit's Progress    Housing resources       Care Coordination Interventions: Patient confirmed currently living in his car, has moved from friends home Patient confirmed that he did receive the housing resources mailed to him but has not had a chance to review Patient confirms that he has 2 churches that continue to  assist him with identifying housing Patient now interested in [place], patient would like providers office to assist with [place], this social worker clarified that he would have to call the Independent Living apartments individually to apply This Education officer, museum also discussed option on the Forensic [place] per patient he cannot go there This Education officer, museum encouraged patient to review housing resources provided and to follow up accordingly        SDOH assessments and interventions completed:  No     Care Coordination Interventions Activated:  Yes  Care Coordination Interventions:  Yes, provided   Follow up plan: No further intervention required.   Encounter Outcome:  Pt. Visit Completed

## 2022-06-28 NOTE — Patient Instructions (Signed)
Visit Information  Thank you for taking time to visit with me today. Please don't hesitate to contact me if I can be of assistance to you.   Following are the goals we discussed today:   Goals Addressed             This Visit's Progress    Housing resources       Care Coordination Interventions: Patient confirmed currently living in his car, has moved from friends home Patient confirmed that he did receive the housing resources mailed to him but has not had a chance to review Patient confirms that he has 2 churches that continue to  assist him with identifying housing Patient now interested in [place], patient would like providers office to assist with [place], this social worker clarified that he would have to call the Independent Living apartments individually to apply This social worker also discussed option on the [place] per patient he cannot go there This Education officer, museum encouraged patient to review housing resources provided and to follow up accordingly         Please call the care guide team at [phone] if you need to cancel or reschedule your appointment.   If you are experiencing a Mental Health or [place] or need someone to talk to, please call the Suicide and Crisis Lifeline: 988 call 911   Patient verbalizes understanding of instructions and care plan provided today and agrees to view in [place]. Active MyChart status and patient understanding of how to access instructions and care plan via MyChart confirmed with patient.     No further follow up required: patient to follow up with housing resources provided  [employer], [place] Worker  [facility] Care Management [phone]

## 2022-06-28 NOTE — Patient Outreach (Addendum)
  Care Coordination   06/28/2022 Name: Eathan Groman MRN: [id number] DOB: 1946-05-06   Care Coordination Outreach Attempts:  An unsuccessful telephone outreach was attempted for a scheduled appointment today.  Follow Up Plan:  Additional outreach attempts will be made to offer the patient care coordination information and services.   Encounter Outcome:  No Answer  Care Coordination Interventions Activated:  No   Care Coordination Interventions:  No, not indicated    Jedrick Hutcherson, [place] Worker  [facility] Care Management [phone]

## 2022-06-29 ENCOUNTER — Encounter: Payer: Self-pay | Admitting: Internal Medicine

## 2022-06-29 ENCOUNTER — Ambulatory Visit (INDEPENDENT_AMBULATORY_CARE_PROVIDER_SITE_OTHER): Payer: Medicare Other | Admitting: Internal Medicine

## 2022-06-29 VITALS — BP 143/58 | HR 71 | Temp 97.1°F | Wt 190.0 lb

## 2022-06-29 DIAGNOSIS — M5136 Other intervertebral disc degeneration, lumbar region: Secondary | ICD-10-CM

## 2022-06-29 DIAGNOSIS — N5201 Erectile dysfunction due to arterial insufficiency: Secondary | ICD-10-CM

## 2022-06-29 DIAGNOSIS — E1142 Type 2 diabetes mellitus with diabetic polyneuropathy: Secondary | ICD-10-CM

## 2022-06-29 DIAGNOSIS — Z23 Encounter for immunization: Secondary | ICD-10-CM | POA: Diagnosis not present

## 2022-06-29 DIAGNOSIS — I10 Essential (primary) hypertension: Secondary | ICD-10-CM | POA: Diagnosis not present

## 2022-06-29 DIAGNOSIS — I509 Heart failure, unspecified: Secondary | ICD-10-CM | POA: Diagnosis not present

## 2022-06-29 DIAGNOSIS — Z85118 Personal history of other malignant neoplasm of bronchus and lung: Secondary | ICD-10-CM

## 2022-06-29 DIAGNOSIS — I739 Peripheral vascular disease, unspecified: Secondary | ICD-10-CM

## 2022-06-29 DIAGNOSIS — E782 Mixed hyperlipidemia: Secondary | ICD-10-CM

## 2022-06-29 DIAGNOSIS — F5101 Primary insomnia: Secondary | ICD-10-CM

## 2022-06-29 DIAGNOSIS — M51369 Other intervertebral disc degeneration, lumbar region without mention of lumbar back pain or lower extremity pain: Secondary | ICD-10-CM

## 2022-06-29 DIAGNOSIS — I350 Nonrheumatic aortic (valve) stenosis: Secondary | ICD-10-CM

## 2022-06-29 DIAGNOSIS — E663 Overweight: Secondary | ICD-10-CM

## 2022-06-29 DIAGNOSIS — D692 Other nonthrombocytopenic purpura: Secondary | ICD-10-CM

## 2022-06-29 DIAGNOSIS — I700 Atherosclerosis of aorta: Secondary | ICD-10-CM | POA: Diagnosis not present

## 2022-06-29 DIAGNOSIS — J9611 Chronic respiratory failure with hypoxia: Secondary | ICD-10-CM | POA: Diagnosis not present

## 2022-06-29 DIAGNOSIS — Z6828 Body mass index (BMI) 28.0-28.9, adult: Secondary | ICD-10-CM

## 2022-06-29 DIAGNOSIS — J418 Mixed simple and mucopurulent chronic bronchitis: Secondary | ICD-10-CM | POA: Diagnosis not present

## 2022-06-29 DIAGNOSIS — N1831 Chronic kidney disease, stage 3a: Secondary | ICD-10-CM

## 2022-06-29 DIAGNOSIS — D631 Anemia in chronic kidney disease: Secondary | ICD-10-CM

## 2022-06-29 DIAGNOSIS — G4730 Sleep apnea, unspecified: Secondary | ICD-10-CM

## 2022-06-29 DIAGNOSIS — R251 Tremor, unspecified: Secondary | ICD-10-CM

## 2022-06-29 DIAGNOSIS — K219 Gastro-esophageal reflux disease without esophagitis: Secondary | ICD-10-CM

## 2022-06-29 LAB — POCT GLYCOSYLATED HEMOGLOBIN (HGB A1C): Hemoglobin A1C: 6 % — AB (ref 4.0–5.6)

## 2022-06-29 NOTE — Progress Notes (Signed)
Subjective:    Patient ID: Nathaniel Little, male    DOB: [birth year], 76 y.o.   MRN: [id number]  HPI  Patient presents to clinic today for follow-up of chronic conditions.  HTN: His BP today is 151/62.  He is taking Lisinopril, Clonidine and Carvedilol as prescribed.  ECG from 04/2022 reviewed.  DM2 with Neuropathy: His last A1c was 8.2%, 03/2022.  He is taking Metformin and Glipizide as prescribed.  He takes Gabapentin for neuropathic pain.  He is on Lisinopril for renal protection. His blood sugar ranges 110- 250. He checks his feet routinely.  His last eye exam was 08/2021.  Flu 06/2021.  Pneumovax 05/2021.  Prevnar 06/2018.  COVID x4.  CHF: He reports chronic cough, intermittent shortness of breath and lower extremity edema.  He is taking Clonidine, Lisinopril, Carvedilol and Torsemide as prescribed.  Echo from 03/2021 reviewed.  He follows with cardiology.   COPD with CHRF: He reports chronic cough and intermittent shortness of breath.  He does not wear oxygen at this time.  He is taking Breztri and Albuterol as prescribed.  There are no PFTs on file.  He follows with pulmonology.  HLD with PVD/Aortic Atherosclerosis: His last LDL was 65, triglycerides 128, 03/2022.  He is not taking Atorvastatin but is taking Ezetimibe.  He is taking Aspirin. He does not consume a low-fat diet.  ED: He is not currently taking any medications for this.  He is concerned because he occasionally see black specks in his sperm.  He does not follow with urology.  GERD: He has no idea what triggers this.  He denies breakthrough on Pantoprazole.  There is no upper GI on file.  OSA: He averages 7 hours of sleep per night without the use of his CPAP.  There is no sleep study on file.  Insomnia: He reports difficulty falling and staying asleep.  He is not taking any medications for sleep at this time.  There is no sleep study on file.  Iron Deficiency Anemia: His last H/H was 11.4/35.8, 04/2022.  He is not taking any oral  iron at this time.  He does not follow with hematology.  OA: Mainly in his back.  He takes Tylenol and Gabapentin as prescribed with some relief of symptoms.  He follows with orthopedics.  Tremor: Mainly in his head and hands.  He is not taking any medications for this at this time.  He does not follow with neurology.  CKD: His last creatinine was 1.58, GFR 48, 04/2022.  He is on lisinopril for renal protection.  He does not follow with nephrology.  Review of Systems     Past Medical History:  Diagnosis Date   (HFpEF) heart failure with preserved ejection fraction ([place])    a. 08/2017 Echo: EF 60-65%, Gr1 DD.   Arthritis    COPD (chronic obstructive pulmonary disease) ([place])    Coronary artery disease 2021   Depression    Diabetes mellitus without complication (HCC)    Hypertension    Incidental pulmonary nodule, greater than or equal to 65mm 03/11/2020   Left lower lobe - suspicious for bronchogenic neoplasm   Lymphedema    Morbid obesity ([place])    Prostate cancer ([place]) 03/2013   ? PANCREATIC   S/P TAVR (transcatheter aortic valve replacement) 04/01/2020   s/p TAVR with a 29 mm Edwards Sapien 3 THV via the TF approach by Drs Buena Irish & Roxy Manns   Severe aortic stenosis    Sleep apnea with use of  continuous positive airway pressure (CPAP)    Tobacco abuse    Tremor    HEAD    Current Outpatient Medications  Medication Sig Dispense Refill   albuterol (VENTOLIN HFA) 108 (90 Base) MCG/ACT inhaler Inhale 2 puffs into the lungs every 6 (six) hours as needed for wheezing or shortness of breath. 54 g 1   aspirin EC 81 MG tablet Take 81 mg by mouth daily.     atorvastatin (LIPITOR) 10 MG tablet Take 1 tablet (10 mg total) by mouth daily. 30 tablet 2   BREZTRI AEROSPHERE 160-9-4.8 MCG/ACT AERO Inhale 2 puffs into the lungs 2 (two) times daily. 10.7 g 2   calcium-vitamin D (OSCAL WITH D) 250-125 MG-UNIT tablet Take 1 tablet by mouth daily.     carvedilol (COREG) 6.25 MG tablet Take 1 tablet  (6.25 mg total) by mouth 2 (two) times daily with a meal. 180 tablet 3   cloNIDine (CATAPRES) 0.1 MG tablet Take 1 tablet (0.1 mg total) by mouth 2 (two) times daily. 180 tablet 3   ezetimibe (ZETIA) 10 MG tablet Take 1 tablet (10 mg total) by mouth daily. 90 tablet 3   fluticasone (FLONASE) 50 MCG/ACT nasal spray      gabapentin (NEURONTIN) 100 MG capsule TAKE 1-2 CAPSULES BY MOUTH AT BEDTIME 180 capsule 0   glipiZIDE (GLUCOTROL) 5 MG tablet TAKE 1 TABLET BY MOUTH TWICE DAILY BEFORE A MEAL 180 tablet 0   glucose blood (ACCU-CHEK GUIDE) test strip Check blood sugar twice daily 100 each 12   ipratropium-albuterol (DUONEB) 0.5-2.5 (3) MG/3ML SOLN Take 3 mLs by nebulization every 6 (six) hours as needed (Asthma). 360 mL 12   lisinopril (ZESTRIL) 5 MG tablet Take 1 tablet (5 mg total) by mouth daily. 90 tablet 3   metFORMIN (GLUCOPHAGE) 500 MG tablet Take 1,000 mg by mouth in the morning and at bedtime.     pantoprazole (PROTONIX) 40 MG tablet TAKE 1 TABLET BY MOUTH TWICE DAILY 60 tablet 3   potassium chloride (KLOR-CON) 10 MEQ tablet Take 10 mEq by mouth daily.     sennosides-docusate sodium (SENOKOT-S) 8.6-50 MG tablet Take 1 tablet by mouth in the morning and at bedtime. 180 tablet 0   torsemide (DEMADEX) 10 MG tablet Take 1 tablet (10 mg total) by mouth daily. 30 tablet 6   No current facility-administered medications for this visit.    Allergies  Allergen Reactions   Codeine Itching    Family History  Problem Relation Age of Onset   Breast cancer Mother    Cancer Father        Black Lung   Cerebral palsy Brother    Tuberculosis Paternal Uncle    Prostate cancer Neg Hx    Kidney cancer Neg Hx    Bladder Cancer Neg Hx     Social History   Socioeconomic History   Marital status: Widowed    Spouse name: Not on file   Number of children: 2   Years of education: Not on file   Highest education level: Not on file  Occupational History   Occupation: retired -Patent attorney   Tobacco Use   Smoking status: Former    Packs/day: 4.00    Years: 68.00    Total pack years: 272.00    Types: Cigarettes    Quit date: 10/04/2021    Years since quitting: 0.7   Smokeless tobacco: Former    Types: Chew, Snuff    Quit date: 2000   Tobacco  comments:    1 cigs a day   Vaping Use   Vaping Use: Never used  Substance and Sexual Activity   Alcohol use: Not Currently    Alcohol/week: 0.0 - 1.0 standard drinks of alcohol    Comment: quit 40years ago   Drug use: No   Sexual activity: Not Currently  Other Topics Concern   Not on file  Social History Narrative   Living at [place] Strain: Low Risk  (12/17/2021)   Overall Financial Resource Strain (CARDIA)    Difficulty of Paying Living Expenses: Not hard at all  Food Insecurity: No Food Insecurity (06/08/2022)   Hunger Vital Sign    Worried About Running Out of Food in the Last Year: Never true    Ran Out of Food in the Last Year: Never true  Transportation Needs: Unmet Transportation Needs (06/08/2022)   PRAPARE - Transportation    Lack of Transportation (Medical): Yes    Lack of Transportation (Non-Medical): Yes  Physical Activity: Inactive (05/18/2021)   Exercise Vital Sign    Days of Exercise per Week: 0 days    Minutes of Exercise per Session: 0 min  Stress: Stress Concern Present (12/17/2021)   [place]    Feeling of Stress : To some extent  Social Connections: Moderately Isolated (05/18/2021)   Social Connection and Isolation Panel [NHANES]    Frequency of Communication with Friends and Family: More than three times a week    Frequency of Social Gatherings with Friends and Family: More than three times a week    Attends Religious Services: More than 4 times per year    Active Member of [employer] or Organizations: No    Attends Archivist Meetings: Never    Marital Status:  Widowed  Intimate Partner Violence: Not At Risk (05/24/2022)   Humiliation, Afraid, Rape, and Kick questionnaire    Fear of Current or Ex-Partner: No    Emotionally Abused: No    Physically Abused: No    Sexually Abused: No     Constitutional: Denies fever, malaise, fatigue, headache or abrupt weight changes.  HEENT: Denies eye pain, eye redness, ear pain, ringing in the ears, wax buildup, runny nose, nasal congestion, bloody nose, or sore throat. Respiratory: Patient reports chronic cough, intermittent shortness of breath.  Denies difficulty breathing, or sputum production.   Cardiovascular: Patient reports swelling in lower extremities.  Denies chest pain, chest tightness, palpitations or swelling in the hands.  Gastrointestinal: Denies abdominal pain, bloating, constipation, diarrhea or blood in the stool.  GU: Patient reports erectile dysfunction.  Denies urgency, frequency, pain with urination, burning sensation, blood in urine, odor or discharge. Musculoskeletal: Patient reports chronic low back pain, difficulty with gait.  Denies decrease in range of motion, muscle pain or joint swelling.  Skin: Denies redness, rashes, lesions or ulcercations.  Neurological: Patient reports paresthesias of lower extremity, tremor and insomnia.  Denies dizziness, difficulty with memory, difficulty with speech or problems with balance and coordination.  Psych: Denies anxiety, depression, SI/HI.  No other specific complaints in a complete review of systems (except as listed in HPI above).  Objective:   Physical Exam BP (!) 143/58 (BP Location: Right Arm, Patient Position: Sitting)   Pulse 71   Temp (!) 97.1 F (36.2 C) (Temporal)   Wt 190 lb (86.2 kg)   SpO2 98%   BMI 28.06 kg/m  Wt Readings from Last 3 Encounters:  04/26/22 196 lb (88.9 kg)  04/08/22 189 lb (85.7 kg)  03/31/22 183 lb (83 kg)    General: Appears his stated age, overweight, chronically ill-appearing in NAD. Skin: Warm, dry  and intact. No ulcerations noted. HEENT: Head: normal shape and size; Eyes: sclera white, no icterus, conjunctiva pink, PERRLA and EOMs intact;  Cardiovascular: Normal rate and rhythm. S1,S2 noted.  No murmur, rubs or gallops noted.  Trace pitting BLE edema. No carotid bruits noted. Pulmonary/Chest: Normal effort and positive vesicular breath sounds. No respiratory distress. No wheezes, rales or ronchi noted.  Abdomen: Soft and tender in the epigastric region. Normal bowel sounds. Musculoskeletal: Gait slow and steady with use of rolling walker. Neurological: Alert and oriented.   BMET    Component Value Date/Time   NA 140 04/08/2022 2119   NA 141 08/03/2021 1323   K 3.5 04/08/2022 2119   CL 108 04/08/2022 2119   CO2 26 04/08/2022 2119   GLUCOSE 121 (H) 04/08/2022 2119   BUN 22 04/08/2022 2119   BUN 24 08/03/2021 1323   CREATININE 1.58 (H) 04/08/2022 2119   CREATININE 1.70 (H) 11/13/2021 1451   CALCIUM 8.7 (L) 04/08/2022 2119   GFRNONAA 45 (L) 04/08/2022 2119   GFRNONAA 70 12/06/2019 1420   GFRAA 55 (L) 11/07/2020 1534   GFRAA 81 12/06/2019 1420    Lipid Panel     Component Value Date/Time   CHOL 131 03/31/2022 0847   CHOL 149 02/08/2020 1228   TRIG 148 03/31/2022 0847   HDL 43 03/31/2022 0847   HDL 38 (L) 02/08/2020 1228   CHOLHDL 3.0 03/31/2022 0847   LDLCALC 65 03/31/2022 0847    CBC    Component Value Date/Time   WBC 9.4 04/08/2022 2119   RBC 4.07 (L) 04/08/2022 2119   HGB 11.4 (L) 04/08/2022 2119   HGB 13.2 07/24/2021 1543   HCT 35.8 (L) 04/08/2022 2119   HCT 38.8 07/24/2021 1543   PLT 230 04/08/2022 2119   PLT 194 07/24/2021 1543   MCV 88.0 04/08/2022 2119   MCV 93 07/24/2021 1543   MCH 28.0 04/08/2022 2119   MCHC 31.8 04/08/2022 2119   RDW 14.3 04/08/2022 2119   RDW 13.4 07/24/2021 1543   LYMPHSABS 1.7 03/21/2022 1952   LYMPHSABS 1.1 02/08/2020 1228   MONOABS 0.7 03/21/2022 1952   EOSABS 0.3 03/21/2022 1952   EOSABS 0.1 02/08/2020 1228   BASOSABS  0.1 03/21/2022 1952   BASOSABS 0.0 02/08/2020 1228    Hgb A1C Lab Results  Component Value Date   HGBA1C 8.2 (A) 03/31/2022           Assessment & Plan:     RTC in 6 months for your annual exam Webb Silversmith, NP

## 2022-06-29 NOTE — Assessment & Plan Note (Signed)
POCT A1C 6% Urine microalbumin has been checked in the last year Continue gabapentin,  metformin and glipizide Encouraged him to consume a low carb diet and exercise for weight loss Eye exam due 08/2021 Encouraged routine foot exam Flu shot today Pneumovax and prevnar UTD Encouraged him to get a covid booster

## 2022-06-29 NOTE — Assessment & Plan Note (Signed)
Advised him to start oral iron CBC today

## 2022-06-29 NOTE — Assessment & Plan Note (Signed)
Continue Tylenol and gabapentin

## 2022-06-29 NOTE — Assessment & Plan Note (Signed)
Not medicated Will monitor

## 2022-06-29 NOTE — Assessment & Plan Note (Signed)
CMET and lipid profile today Continue Ezetimibe and Aspirin Encouraged him to consume a low fat diet

## 2022-06-29 NOTE — Assessment & Plan Note (Signed)
Continue Aspirin

## 2022-06-29 NOTE — Assessment & Plan Note (Signed)
CMET and lipid profile today Encouraged him to consume a low fat diet Continue Ezetimibe

## 2022-06-29 NOTE — Assessment & Plan Note (Signed)
Not wearing CPAP Encouraged weight loss as this can help reduce sleep apnea symptoms

## 2022-06-29 NOTE — Patient Instructions (Signed)

## 2022-06-29 NOTE — Assessment & Plan Note (Signed)
CMET today Continue lisinopril for renal protection

## 2022-06-29 NOTE — Assessment & Plan Note (Signed)
Not undergoing treatment at this time

## 2022-06-29 NOTE — Assessment & Plan Note (Signed)
Encouraged weight loss as this can help reduce reflux symptoms Continue Pantoprazole

## 2022-06-29 NOTE — Assessment & Plan Note (Signed)
He declines medication at this time He declines referral to urology for semen analysis

## 2022-06-29 NOTE — Assessment & Plan Note (Signed)
Not wearing oxygen

## 2022-06-29 NOTE — Assessment & Plan Note (Signed)
CBC today.  

## 2022-06-29 NOTE — Assessment & Plan Note (Signed)
Initially elevated but manual repeat improved Continue clonidine, lisinopril and carvedilol Encouraged DASH diet and exercise for weight loss CMET today Will monitor

## 2022-06-29 NOTE — Assessment & Plan Note (Signed)
Encouraged diet and exercise for weight loss ?

## 2022-06-29 NOTE — Assessment & Plan Note (Signed)
Continue breztri and Albuterol

## 2022-06-29 NOTE — Assessment & Plan Note (Signed)
Encouraged DASH diet and exercise for weight loss Monitor daily weights Continue clonidine, lisinopril, carvediol and torsemide He will continue to follow with cardiology

## 2022-06-29 NOTE — Assessment & Plan Note (Signed)
Continue Ezetimibe and aspirin Encouraged low fat diet CMET and lipid profile today

## 2022-06-30 LAB — LIPID PANEL
Cholesterol: 157 mg/dL (ref <200)
HDL: 53 mg/dL (ref >=40)
LDL Cholesterol (Calc): 83 mg/dL (calc)
Non-HDL Cholesterol (Calc): 104 mg/dL (calc) (ref <130)
Total CHOL/HDL Ratio: 3 (calc) (ref <5.0)
Triglycerides: 118 mg/dL (ref <150)

## 2022-06-30 LAB — COMPLETE METABOLIC PANEL WITH GFR
AG Ratio: 1.4 (calc) (ref 1.0–2.5)
ALT: 17 U/L (ref 9–46)
AST: 18 U/L (ref 10–35)
Albumin: 4.1 g/dL (ref 3.6–5.1)
Alkaline phosphatase (APISO): 114 U/L (ref 35–144)
BUN/Creatinine Ratio: 22 (calc) (ref 6–22)
BUN: 29 mg/dL — ABNORMAL HIGH (ref 7–25)
CO2: 24 mmol/L (ref 20–32)
Calcium: 9.6 mg/dL (ref 8.6–10.3)
Chloride: 105 mmol/L (ref 98–110)
Creat: 1.32 mg/dL — ABNORMAL HIGH (ref 0.70–1.28)
Globulin: 2.9 g/dL (calc) (ref 1.9–3.7)
Glucose, Bld: 176 mg/dL — ABNORMAL HIGH (ref 65–139)
Potassium: 4.1 mmol/L (ref 3.5–5.3)
Sodium: 140 mmol/L (ref 135–146)
Total Bilirubin: 0.4 mg/dL (ref 0.2–1.2)
Total Protein: 7 g/dL (ref 6.1–8.1)
eGFR: 56 mL/min/{1.73_m2} — ABNORMAL LOW (ref >=60)

## 2022-06-30 LAB — CBC
HCT: 38.2 % — ABNORMAL LOW (ref 38.5–50.0)
Hemoglobin: 12.3 g/dL — ABNORMAL LOW (ref 13.2–17.1)
MCH: 27.7 pg (ref 27.0–33.0)
MCHC: 32.2 g/dL (ref 32.0–36.0)
MCV: 86 fL (ref 80.0–100.0)
MPV: 10.8 fL (ref 7.5–12.5)
Platelets: 209 10*3/uL (ref 140–400)
RBC: 4.44 10*6/uL (ref 4.20–5.80)
RDW: 16.5 % — ABNORMAL HIGH (ref 11.0–15.0)
WBC: 7.1 10*3/uL (ref 3.8–10.8)

## 2022-07-05 ENCOUNTER — Ambulatory Visit: Payer: Medicare Other | Admitting: Internal Medicine

## 2022-07-08 DIAGNOSIS — E1142 Type 2 diabetes mellitus with diabetic polyneuropathy: Secondary | ICD-10-CM

## 2022-07-08 LAB — GLUCOSE, POCT (MANUAL RESULT ENTRY): POC Glucose: 196 mg/dl — AB (ref 70–99)

## 2022-07-08 NOTE — Congregational Nurse Program (Signed)
Dept: [place] Nurse Program Note . Date of Encounter: 07/08/2022 Client to [facility] clinic for vital sign and blood glucose check. BP (!) 142/72 (BP Location: Left Arm, Patient Position: Sitting, Cuff Size: Normal)   Pulse 82   SpO2 99% nonfasting blood glucose 196. Client reports he is taking his oral antidiabetic medication as directed, he did see his PCP on 9/26. He reports he and his girlfriend have found a room in [facility], with the ultimate goal of moving to [facility] in the spring. For now he plans to continue to come to [facility] for his health care and clinic visits. Currently his girlfriend is working in [place] and he drives her to work 4 days a a week. No other needs at this time.  Past Medical History: Past Medical History:  Diagnosis Date   (HFpEF) heart failure with preserved ejection fraction ([place])    a. 08/2017 Echo: EF 60-65%, Gr1 DD.   Arthritis    COPD (chronic obstructive pulmonary disease) ([place])    Coronary artery disease 2021   Depression    Diabetes mellitus without complication (HCC)    Hypertension    Incidental pulmonary nodule, greater than or equal to 39mm 03/11/2020   Left lower lobe - suspicious for bronchogenic neoplasm   Lymphedema    Morbid obesity ([place])    Prostate cancer ([place]) 03/2013   ? PANCREATIC   S/P TAVR (transcatheter aortic valve replacement) 04/01/2020   s/p TAVR with a 29 mm Edwards Sapien 3 THV via the TF approach by Drs Buena Irish & Roxy Manns   Severe aortic stenosis    Sleep apnea with use of continuous positive airway pressure (CPAP)    Tobacco abuse    Tremor    HEAD    Encounter Details:  CNP Questionnaire - 07/08/22 0945       Questionnaire   Ask client: Do you give verbal consent for me to treat you today? 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  He was living in his car   Insurance Medicare    Insurance/Financial Assistance Referral N/A    Medication N/A    Medical Provider Yes   Webb Silversmith NP last seen 9/26   Screening Referrals Made N/A    Medical Referrals Made N/A    Medical Appointment Made N/A    Recently w/o PCP, now 1st time PCP visit completed due to CNs referral or appointment made [place]   client reports he now has a room in [place] and denies food insercurity   Transportation N/A   has his own vehicle   Housing/Utilities No permanent housing   client reports he is now living with his girlfriend in [place], they have a room   Interpersonal Safety N/A    Interventions Advocate/Support;Reviewed Medications    Abnormal to Normal Screening Since Last CN Visit N/A    Screenings CN Performed Blood Pressure;Blood Glucose    Sent Client to Lab for: N/A    Did client attend any of the following based off CNs referral or appointments made? N/A    ED Visit Averted N/A    Life-Saving Intervention Made N/A

## 2022-07-09 ENCOUNTER — Ambulatory Visit: Admission: RE | Admit: 2022-07-09 | Payer: Medicare Other | Source: Ambulatory Visit

## 2022-07-12 ENCOUNTER — Ambulatory Visit: Payer: Self-pay

## 2022-07-12 NOTE — Patient Instructions (Signed)
Visit Information  Thank you for taking time to visit with me today. Please don't hesitate to contact me if I can be of assistance to you.   Following are the goals we discussed today:   Goals Addressed             This Visit's Progress    RNCM: Find stable housing       Care Coordination Interventions: Evaluation of current treatment plan related to homeless and management of chronic conditions and patient's adherence to plan as established by provider.The patient has adequate housing at this time. He and his lady friend are now living together and he is happy that he has stable housing.  Advised patient to call the office for changes in condition, questions, or concerns Provided education to patient re: acceptable levels for blood sugars. The patient states when he took his blood sugars it was 74. Denies any issues with his blood sugars and states they are stable Reviewed medications with patient and discussed compliance. Patient states he is taking his medications as directed. He does not feel the gabapentin is working for his pain concerns. Ask about Volteran gel and other topical applications.  Provided patient with DM and COPD educational materials related to his chronic conditions of COPD and DM Reviewed scheduled/upcoming provider appointments including: Saw his pcp on 06-29-2022. Has an appointment on 12-28-2021 Social Worker available for assistance with ongoing support and needs Discussed plans with patient for ongoing care management follow up and provided patient with direct contact information for care management team Advised patient to discuss changes in his health and well being with provider Screening for signs and symptoms of depression related to chronic disease state  Assessed social determinant of health barriers          Our next appointment is by telephone on 09-20-2022 at 330 pm  Please call the care guide team at [phone] if you need to cancel or reschedule  your appointment.   If you are experiencing a Mental Health or [place] or need someone to talk to, please call the Suicide and Crisis Lifeline: 988 call the [place] National Suicide Prevention Lifeline: 518-080-8569 or TTY: 612-707-4730 TTY 804-850-3588) to talk to a trained counselor call 1-800-273-TALK (toll free, 24 hour hotline)  The patient verbalized understanding of instructions, educational materials, and care plan provided today and DECLINED offer to receive copy of patient instructions, educational materials, and care plan.   Telephone follow up appointment with care management team member scheduled for: 09-20-2022 at 330 pm  [place], MSN, [place]  Mobile: [phone]

## 2022-07-12 NOTE — Patient Outreach (Signed)
  Care Coordination   Follow Up Visit Note   07/12/2022 Name: Nathaniel Little MRN: [id number] DOB: [birth year]  Nathaniel Little is a 76 y.o. year old male who sees Baity, Coralie Keens, NP for primary care. I spoke with  [name] by phone today.  What matters to the patients health and wellness today?  Effective Management of his DM and COPS. Happy he has stable housing now.     Goals Addressed             This Visit's Progress    RNCM: Find stable housing       Care Coordination Interventions: Evaluation of current treatment plan related to homeless and management of chronic conditions and patient's adherence to plan as established by provider.The patient has adequate housing at this time. He and his lady friend are now living together and he is happy that he has stable housing.  Advised patient to call the office for changes in condition, questions, or concerns Provided education to patient re: acceptable levels for blood sugars. The patient states when he took his blood sugars it was 74. Denies any issues with his blood sugars and states they are stable Reviewed medications with patient and discussed compliance. Patient states he is taking his medications as directed. He does not feel the gabapentin is working for his pain concerns. Ask about Volteran gel and other topical applications.  Provided patient with DM and COPD educational materials related to his chronic conditions of COPD and DM Reviewed scheduled/upcoming provider appointments including: Saw his pcp on 06-29-2022. Has an appointment on 12-28-2021 Social Worker available for assistance with ongoing support and needs Discussed plans with patient for ongoing care management follow up and provided patient with direct contact information for care management team Advised patient to discuss changes in his health and well being with provider Screening for signs and symptoms of depression related to chronic disease state  Assessed social  determinant of health barriers          SDOH assessments and interventions completed:  Yes  SDOH Interventions Today    Flowsheet Row Most Recent Value  SDOH Interventions   Food Insecurity Interventions Intervention Not Indicated  Housing Interventions Intervention Not Indicated, Other (Comment)  [the patient now has adequate housing at the time]  Transportation Interventions Intervention Not Indicated  Utilities Interventions Intervention Not Indicated  Financial Strain Interventions Intervention Not Indicated  Stress Interventions Intervention Not Indicated  Social Connections Interventions Intervention Not Indicated        Care Coordination Interventions Activated:  Yes  Care Coordination Interventions:  Yes, provided   Follow up plan: Follow up call scheduled for 09-20-2022 at 330 pm    Encounter Outcome:  Pt. Visit Completed    Noreene Larsson RN, MSN, [facility]  Mobile: [phone]

## 2022-07-13 ENCOUNTER — Encounter: Payer: Self-pay | Admitting: Oncology

## 2022-07-13 ENCOUNTER — Inpatient Hospital Stay: Payer: Medicare Other | Attending: Oncology | Admitting: Oncology

## 2022-07-13 ENCOUNTER — Inpatient Hospital Stay: Payer: Medicare Other

## 2022-07-23 DIAGNOSIS — J449 Chronic obstructive pulmonary disease, unspecified: Secondary | ICD-10-CM | POA: Diagnosis not present

## 2022-07-23 DIAGNOSIS — I5030 Unspecified diastolic (congestive) heart failure: Secondary | ICD-10-CM | POA: Diagnosis not present

## 2022-07-25 DIAGNOSIS — I5030 Unspecified diastolic (congestive) heart failure: Secondary | ICD-10-CM | POA: Diagnosis not present

## 2022-07-25 DIAGNOSIS — J449 Chronic obstructive pulmonary disease, unspecified: Secondary | ICD-10-CM | POA: Diagnosis not present

## 2022-07-29 ENCOUNTER — Other Ambulatory Visit: Payer: Self-pay | Admitting: Internal Medicine

## 2022-07-29 DIAGNOSIS — I509 Heart failure, unspecified: Secondary | ICD-10-CM

## 2022-07-29 DIAGNOSIS — J418 Mixed simple and mucopurulent chronic bronchitis: Secondary | ICD-10-CM

## 2022-07-29 DIAGNOSIS — E1142 Type 2 diabetes mellitus with diabetic polyneuropathy: Secondary | ICD-10-CM

## 2022-07-29 DIAGNOSIS — I700 Atherosclerosis of aorta: Secondary | ICD-10-CM

## 2022-08-02 ENCOUNTER — Ambulatory Visit: Payer: Medicare Other | Admitting: Oncology

## 2022-08-02 ENCOUNTER — Other Ambulatory Visit: Payer: Medicare Other

## 2022-08-09 DIAGNOSIS — E782 Mixed hyperlipidemia: Secondary | ICD-10-CM | POA: Diagnosis not present

## 2022-08-09 DIAGNOSIS — E1169 Type 2 diabetes mellitus with other specified complication: Secondary | ICD-10-CM | POA: Diagnosis not present

## 2022-08-09 DIAGNOSIS — E119 Type 2 diabetes mellitus without complications: Secondary | ICD-10-CM | POA: Diagnosis not present

## 2022-08-09 DIAGNOSIS — J449 Chronic obstructive pulmonary disease, unspecified: Secondary | ICD-10-CM | POA: Diagnosis not present

## 2022-08-09 DIAGNOSIS — E1142 Type 2 diabetes mellitus with diabetic polyneuropathy: Secondary | ICD-10-CM | POA: Diagnosis not present

## 2022-08-09 DIAGNOSIS — Z7901 Long term (current) use of anticoagulants: Secondary | ICD-10-CM | POA: Diagnosis not present

## 2022-08-09 DIAGNOSIS — I10 Essential (primary) hypertension: Secondary | ICD-10-CM | POA: Diagnosis not present

## 2022-08-09 DIAGNOSIS — Z79899 Other long term (current) drug therapy: Secondary | ICD-10-CM | POA: Diagnosis not present

## 2022-08-09 DIAGNOSIS — K219 Gastro-esophageal reflux disease without esophagitis: Secondary | ICD-10-CM | POA: Diagnosis not present

## 2022-08-09 DIAGNOSIS — Z87891 Personal history of nicotine dependence: Secondary | ICD-10-CM | POA: Diagnosis not present

## 2022-08-11 DIAGNOSIS — E119 Type 2 diabetes mellitus without complications: Secondary | ICD-10-CM | POA: Diagnosis not present

## 2022-08-11 DIAGNOSIS — Z7901 Long term (current) use of anticoagulants: Secondary | ICD-10-CM | POA: Diagnosis not present

## 2022-08-11 DIAGNOSIS — E782 Mixed hyperlipidemia: Secondary | ICD-10-CM | POA: Diagnosis not present

## 2022-08-11 DIAGNOSIS — I10 Essential (primary) hypertension: Secondary | ICD-10-CM | POA: Diagnosis not present

## 2022-08-11 DIAGNOSIS — E1169 Type 2 diabetes mellitus with other specified complication: Secondary | ICD-10-CM | POA: Diagnosis not present

## 2022-08-18 DIAGNOSIS — R109 Unspecified abdominal pain: Secondary | ICD-10-CM | POA: Diagnosis not present

## 2022-08-18 DIAGNOSIS — R6889 Other general symptoms and signs: Secondary | ICD-10-CM | POA: Diagnosis not present

## 2022-08-18 DIAGNOSIS — Z743 Need for continuous supervision: Secondary | ICD-10-CM | POA: Diagnosis not present

## 2022-08-18 DIAGNOSIS — R1084 Generalized abdominal pain: Secondary | ICD-10-CM | POA: Diagnosis not present

## 2022-08-23 DIAGNOSIS — E0840 Diabetes mellitus due to underlying condition with diabetic neuropathy, unspecified: Secondary | ICD-10-CM | POA: Diagnosis not present

## 2022-08-23 DIAGNOSIS — I5030 Unspecified diastolic (congestive) heart failure: Secondary | ICD-10-CM | POA: Diagnosis not present

## 2022-08-23 DIAGNOSIS — J449 Chronic obstructive pulmonary disease, unspecified: Secondary | ICD-10-CM | POA: Diagnosis not present

## 2022-08-23 DIAGNOSIS — L84 Corns and callosities: Secondary | ICD-10-CM | POA: Diagnosis not present

## 2022-08-23 DIAGNOSIS — B351 Tinea unguium: Secondary | ICD-10-CM | POA: Diagnosis not present

## 2022-08-23 DIAGNOSIS — M2042 Other hammer toe(s) (acquired), left foot: Secondary | ICD-10-CM | POA: Diagnosis not present

## 2022-08-23 DIAGNOSIS — M2041 Other hammer toe(s) (acquired), right foot: Secondary | ICD-10-CM | POA: Diagnosis not present

## 2022-08-25 DIAGNOSIS — E1169 Type 2 diabetes mellitus with other specified complication: Secondary | ICD-10-CM | POA: Diagnosis not present

## 2022-08-25 DIAGNOSIS — I5032 Chronic diastolic (congestive) heart failure: Secondary | ICD-10-CM | POA: Diagnosis not present

## 2022-08-25 DIAGNOSIS — J449 Chronic obstructive pulmonary disease, unspecified: Secondary | ICD-10-CM | POA: Diagnosis not present

## 2022-08-25 DIAGNOSIS — Z79899 Other long term (current) drug therapy: Secondary | ICD-10-CM | POA: Diagnosis not present

## 2022-08-25 DIAGNOSIS — E782 Mixed hyperlipidemia: Secondary | ICD-10-CM | POA: Diagnosis not present

## 2022-08-25 DIAGNOSIS — Z0000 Encounter for general adult medical examination without abnormal findings: Secondary | ICD-10-CM | POA: Diagnosis not present

## 2022-08-25 DIAGNOSIS — Z1159 Encounter for screening for other viral diseases: Secondary | ICD-10-CM | POA: Diagnosis not present

## 2022-08-25 DIAGNOSIS — E119 Type 2 diabetes mellitus without complications: Secondary | ICD-10-CM | POA: Diagnosis not present

## 2022-08-25 DIAGNOSIS — I5030 Unspecified diastolic (congestive) heart failure: Secondary | ICD-10-CM | POA: Diagnosis not present

## 2022-08-25 DIAGNOSIS — I714 Abdominal aortic aneurysm, without rupture, unspecified: Secondary | ICD-10-CM | POA: Diagnosis not present

## 2022-08-25 DIAGNOSIS — K219 Gastro-esophageal reflux disease without esophagitis: Secondary | ICD-10-CM | POA: Diagnosis not present

## 2022-08-25 DIAGNOSIS — R5383 Other fatigue: Secondary | ICD-10-CM | POA: Diagnosis not present

## 2022-08-25 DIAGNOSIS — I10 Essential (primary) hypertension: Secondary | ICD-10-CM | POA: Diagnosis not present

## 2022-09-20 ENCOUNTER — Telehealth: Payer: Self-pay

## 2022-09-20 ENCOUNTER — Telehealth: Payer: Medicare Other

## 2022-09-20 NOTE — Telephone Encounter (Signed)
   CCM RN Visit Note   09-20-2022 Name: Cypress Fanfan MRN: [id number]      DOB: September 11, 1946  Subjective: Etienne Mowers is a 76 y.o. year old male who is a primary care patient of Jearld Fenton, NP. The patient was referred to the Chronic Care Management team for assistance with care management needs subsequent to provider initiation of CCM services and plan of care.      Today's Visit: Engaged with patient by telephone for follow up visit.    The patient called for outreach for Chronic Disease Management and Care Coordination Needs. The patient states his "girl" got a job in [place] and he has moved to [place]. He states he has a new pcp and is established with a new pcp. Will let pcp know. Education and support given. Closing of the care plan.    Plan:No further follow up required: The patient has moved to [facility] and has a new pcp  Noreene Larsson RN, MSN, CCM RN Care Manager  Chronic Care Management Direct Number: [phone]

## 2022-09-22 DIAGNOSIS — I5030 Unspecified diastolic (congestive) heart failure: Secondary | ICD-10-CM | POA: Diagnosis not present

## 2022-09-22 DIAGNOSIS — J449 Chronic obstructive pulmonary disease, unspecified: Secondary | ICD-10-CM | POA: Diagnosis not present

## 2022-09-24 DIAGNOSIS — E1140 Type 2 diabetes mellitus with diabetic neuropathy, unspecified: Secondary | ICD-10-CM | POA: Diagnosis not present

## 2022-09-24 DIAGNOSIS — R079 Chest pain, unspecified: Secondary | ICD-10-CM | POA: Diagnosis not present

## 2022-09-24 DIAGNOSIS — K635 Polyp of colon: Secondary | ICD-10-CM | POA: Diagnosis not present

## 2022-09-24 DIAGNOSIS — J440 Chronic obstructive pulmonary disease with acute lower respiratory infection: Secondary | ICD-10-CM | POA: Diagnosis not present

## 2022-09-24 DIAGNOSIS — I5030 Unspecified diastolic (congestive) heart failure: Secondary | ICD-10-CM | POA: Diagnosis not present

## 2022-09-24 DIAGNOSIS — Z85118 Personal history of other malignant neoplasm of bronchus and lung: Secondary | ICD-10-CM | POA: Diagnosis not present

## 2022-09-24 DIAGNOSIS — Z20822 Contact with and (suspected) exposure to covid-19: Secondary | ICD-10-CM | POA: Diagnosis not present

## 2022-09-24 DIAGNOSIS — I5032 Chronic diastolic (congestive) heart failure: Secondary | ICD-10-CM | POA: Diagnosis not present

## 2022-09-24 DIAGNOSIS — J441 Chronic obstructive pulmonary disease with (acute) exacerbation: Secondary | ICD-10-CM | POA: Diagnosis not present

## 2022-09-24 DIAGNOSIS — I2510 Atherosclerotic heart disease of native coronary artery without angina pectoris: Secondary | ICD-10-CM | POA: Diagnosis not present

## 2022-09-24 DIAGNOSIS — N183 Chronic kidney disease, stage 3 unspecified: Secondary | ICD-10-CM | POA: Diagnosis not present

## 2022-09-24 DIAGNOSIS — Z955 Presence of coronary angioplasty implant and graft: Secondary | ICD-10-CM | POA: Diagnosis not present

## 2022-09-24 DIAGNOSIS — R195 Other fecal abnormalities: Secondary | ICD-10-CM | POA: Diagnosis not present

## 2022-09-24 DIAGNOSIS — D500 Iron deficiency anemia secondary to blood loss (chronic): Secondary | ICD-10-CM | POA: Diagnosis not present

## 2022-09-24 DIAGNOSIS — Z9981 Dependence on supplemental oxygen: Secondary | ICD-10-CM | POA: Diagnosis not present

## 2022-09-24 DIAGNOSIS — J9600 Acute respiratory failure, unspecified whether with hypoxia or hypercapnia: Secondary | ICD-10-CM | POA: Diagnosis not present

## 2022-09-24 DIAGNOSIS — J449 Chronic obstructive pulmonary disease, unspecified: Secondary | ICD-10-CM | POA: Diagnosis not present

## 2022-09-24 DIAGNOSIS — G4733 Obstructive sleep apnea (adult) (pediatric): Secondary | ICD-10-CM | POA: Diagnosis not present

## 2022-09-24 DIAGNOSIS — K640 First degree hemorrhoids: Secondary | ICD-10-CM | POA: Diagnosis not present

## 2022-09-24 DIAGNOSIS — Z5902 Unsheltered homelessness: Secondary | ICD-10-CM | POA: Diagnosis not present

## 2022-09-24 DIAGNOSIS — K219 Gastro-esophageal reflux disease without esophagitis: Secondary | ICD-10-CM | POA: Diagnosis not present

## 2022-09-24 DIAGNOSIS — N200 Calculus of kidney: Secondary | ICD-10-CM | POA: Diagnosis not present

## 2022-09-24 DIAGNOSIS — R0602 Shortness of breath: Secondary | ICD-10-CM | POA: Diagnosis not present

## 2022-09-24 DIAGNOSIS — D123 Benign neoplasm of transverse colon: Secondary | ICD-10-CM | POA: Diagnosis not present

## 2022-09-24 DIAGNOSIS — N281 Cyst of kidney, acquired: Secondary | ICD-10-CM | POA: Diagnosis not present

## 2022-09-24 DIAGNOSIS — J9621 Acute and chronic respiratory failure with hypoxia: Secondary | ICD-10-CM | POA: Diagnosis not present

## 2022-09-24 DIAGNOSIS — K319 Disease of stomach and duodenum, unspecified: Secondary | ICD-10-CM | POA: Diagnosis not present

## 2022-09-24 DIAGNOSIS — Z952 Presence of prosthetic heart valve: Secondary | ICD-10-CM | POA: Diagnosis not present

## 2022-09-24 DIAGNOSIS — I440 Atrioventricular block, first degree: Secondary | ICD-10-CM | POA: Diagnosis not present

## 2022-09-24 DIAGNOSIS — Z791 Long term (current) use of non-steroidal anti-inflammatories (NSAID): Secondary | ICD-10-CM | POA: Diagnosis not present

## 2022-09-24 DIAGNOSIS — E1142 Type 2 diabetes mellitus with diabetic polyneuropathy: Secondary | ICD-10-CM | POA: Diagnosis not present

## 2022-09-24 DIAGNOSIS — K2289 Other specified disease of esophagus: Secondary | ICD-10-CM | POA: Diagnosis not present

## 2022-09-24 DIAGNOSIS — F17210 Nicotine dependence, cigarettes, uncomplicated: Secondary | ICD-10-CM | POA: Diagnosis not present

## 2022-09-24 DIAGNOSIS — R0789 Other chest pain: Secondary | ICD-10-CM | POA: Diagnosis not present

## 2022-09-24 DIAGNOSIS — Z923 Personal history of irradiation: Secondary | ICD-10-CM | POA: Diagnosis not present

## 2022-09-24 DIAGNOSIS — J9611 Chronic respiratory failure with hypoxia: Secondary | ICD-10-CM | POA: Diagnosis not present

## 2022-09-24 DIAGNOSIS — K921 Melena: Secondary | ICD-10-CM | POA: Diagnosis not present

## 2022-09-24 DIAGNOSIS — K625 Hemorrhage of anus and rectum: Secondary | ICD-10-CM | POA: Diagnosis not present

## 2022-09-24 DIAGNOSIS — Z9889 Other specified postprocedural states: Secondary | ICD-10-CM | POA: Diagnosis not present

## 2022-09-24 DIAGNOSIS — E1122 Type 2 diabetes mellitus with diabetic chronic kidney disease: Secondary | ICD-10-CM | POA: Diagnosis not present

## 2022-09-24 DIAGNOSIS — Z5900 Homelessness unspecified: Secondary | ICD-10-CM | POA: Diagnosis not present

## 2022-09-24 DIAGNOSIS — I130 Hypertensive heart and chronic kidney disease with heart failure and stage 1 through stage 4 chronic kidney disease, or unspecified chronic kidney disease: Secondary | ICD-10-CM | POA: Diagnosis not present

## 2022-09-24 DIAGNOSIS — I25119 Atherosclerotic heart disease of native coronary artery with unspecified angina pectoris: Secondary | ICD-10-CM | POA: Diagnosis not present

## 2022-09-24 DIAGNOSIS — K3189 Other diseases of stomach and duodenum: Secondary | ICD-10-CM | POA: Diagnosis not present

## 2022-09-24 DIAGNOSIS — D120 Benign neoplasm of cecum: Secondary | ICD-10-CM | POA: Diagnosis not present

## 2022-09-24 DIAGNOSIS — K644 Residual hemorrhoidal skin tags: Secondary | ICD-10-CM | POA: Diagnosis not present

## 2022-09-24 DIAGNOSIS — K922 Gastrointestinal hemorrhage, unspecified: Secondary | ICD-10-CM | POA: Diagnosis not present

## 2022-09-24 DIAGNOSIS — K6389 Other specified diseases of intestine: Secondary | ICD-10-CM | POA: Diagnosis not present

## 2022-09-24 DIAGNOSIS — B3781 Candidal esophagitis: Secondary | ICD-10-CM | POA: Diagnosis not present

## 2022-09-24 DIAGNOSIS — D509 Iron deficiency anemia, unspecified: Secondary | ICD-10-CM | POA: Diagnosis not present

## 2022-09-24 DIAGNOSIS — R0600 Dyspnea, unspecified: Secondary | ICD-10-CM | POA: Diagnosis not present

## 2022-09-24 DIAGNOSIS — Z90411 Acquired partial absence of pancreas: Secondary | ICD-10-CM | POA: Diagnosis not present

## 2022-09-25 DIAGNOSIS — D509 Iron deficiency anemia, unspecified: Secondary | ICD-10-CM | POA: Diagnosis not present

## 2022-09-25 DIAGNOSIS — R0602 Shortness of breath: Secondary | ICD-10-CM | POA: Diagnosis not present

## 2022-09-25 DIAGNOSIS — E1122 Type 2 diabetes mellitus with diabetic chronic kidney disease: Secondary | ICD-10-CM | POA: Diagnosis not present

## 2022-09-25 DIAGNOSIS — J9600 Acute respiratory failure, unspecified whether with hypoxia or hypercapnia: Secondary | ICD-10-CM | POA: Diagnosis not present

## 2022-09-25 DIAGNOSIS — E1140 Type 2 diabetes mellitus with diabetic neuropathy, unspecified: Secondary | ICD-10-CM | POA: Diagnosis not present

## 2022-09-25 DIAGNOSIS — I5030 Unspecified diastolic (congestive) heart failure: Secondary | ICD-10-CM | POA: Diagnosis not present

## 2022-09-25 DIAGNOSIS — I2510 Atherosclerotic heart disease of native coronary artery without angina pectoris: Secondary | ICD-10-CM | POA: Diagnosis not present

## 2022-09-25 DIAGNOSIS — K922 Gastrointestinal hemorrhage, unspecified: Secondary | ICD-10-CM | POA: Diagnosis not present

## 2022-09-25 DIAGNOSIS — I130 Hypertensive heart and chronic kidney disease with heart failure and stage 1 through stage 4 chronic kidney disease, or unspecified chronic kidney disease: Secondary | ICD-10-CM | POA: Diagnosis not present

## 2022-09-25 DIAGNOSIS — J9611 Chronic respiratory failure with hypoxia: Secondary | ICD-10-CM | POA: Diagnosis not present

## 2022-09-25 DIAGNOSIS — Z791 Long term (current) use of non-steroidal anti-inflammatories (NSAID): Secondary | ICD-10-CM | POA: Diagnosis not present

## 2022-09-25 DIAGNOSIS — N183 Chronic kidney disease, stage 3 unspecified: Secondary | ICD-10-CM | POA: Diagnosis not present

## 2022-09-25 DIAGNOSIS — K625 Hemorrhage of anus and rectum: Secondary | ICD-10-CM | POA: Diagnosis not present

## 2022-09-25 DIAGNOSIS — Z955 Presence of coronary angioplasty implant and graft: Secondary | ICD-10-CM | POA: Diagnosis not present

## 2022-09-25 DIAGNOSIS — Z5900 Homelessness unspecified: Secondary | ICD-10-CM | POA: Diagnosis not present

## 2022-09-25 DIAGNOSIS — J441 Chronic obstructive pulmonary disease with (acute) exacerbation: Secondary | ICD-10-CM | POA: Diagnosis not present

## 2022-09-25 DIAGNOSIS — G4733 Obstructive sleep apnea (adult) (pediatric): Secondary | ICD-10-CM | POA: Diagnosis not present

## 2022-09-26 DIAGNOSIS — J9611 Chronic respiratory failure with hypoxia: Secondary | ICD-10-CM | POA: Diagnosis not present

## 2022-09-26 DIAGNOSIS — D509 Iron deficiency anemia, unspecified: Secondary | ICD-10-CM | POA: Diagnosis not present

## 2022-09-26 DIAGNOSIS — K922 Gastrointestinal hemorrhage, unspecified: Secondary | ICD-10-CM | POA: Diagnosis not present

## 2022-09-26 DIAGNOSIS — J441 Chronic obstructive pulmonary disease with (acute) exacerbation: Secondary | ICD-10-CM | POA: Diagnosis not present

## 2022-09-27 DIAGNOSIS — E1122 Type 2 diabetes mellitus with diabetic chronic kidney disease: Secondary | ICD-10-CM | POA: Diagnosis not present

## 2022-09-27 DIAGNOSIS — R0789 Other chest pain: Secondary | ICD-10-CM | POA: Diagnosis not present

## 2022-09-27 DIAGNOSIS — I5030 Unspecified diastolic (congestive) heart failure: Secondary | ICD-10-CM | POA: Diagnosis not present

## 2022-09-27 DIAGNOSIS — N183 Chronic kidney disease, stage 3 unspecified: Secondary | ICD-10-CM | POA: Diagnosis not present

## 2022-09-27 DIAGNOSIS — R195 Other fecal abnormalities: Secondary | ICD-10-CM | POA: Diagnosis not present

## 2022-09-27 DIAGNOSIS — J9611 Chronic respiratory failure with hypoxia: Secondary | ICD-10-CM | POA: Diagnosis not present

## 2022-09-27 DIAGNOSIS — J441 Chronic obstructive pulmonary disease with (acute) exacerbation: Secondary | ICD-10-CM | POA: Diagnosis not present

## 2022-09-27 DIAGNOSIS — Z9981 Dependence on supplemental oxygen: Secondary | ICD-10-CM | POA: Diagnosis not present

## 2022-09-27 DIAGNOSIS — K922 Gastrointestinal hemorrhage, unspecified: Secondary | ICD-10-CM | POA: Diagnosis not present

## 2022-09-27 DIAGNOSIS — I130 Hypertensive heart and chronic kidney disease with heart failure and stage 1 through stage 4 chronic kidney disease, or unspecified chronic kidney disease: Secondary | ICD-10-CM | POA: Diagnosis not present

## 2022-09-27 DIAGNOSIS — I2510 Atherosclerotic heart disease of native coronary artery without angina pectoris: Secondary | ICD-10-CM | POA: Diagnosis not present

## 2022-09-27 DIAGNOSIS — G4733 Obstructive sleep apnea (adult) (pediatric): Secondary | ICD-10-CM | POA: Diagnosis not present

## 2022-09-27 DIAGNOSIS — D509 Iron deficiency anemia, unspecified: Secondary | ICD-10-CM | POA: Diagnosis not present

## 2022-09-27 DIAGNOSIS — E1140 Type 2 diabetes mellitus with diabetic neuropathy, unspecified: Secondary | ICD-10-CM | POA: Diagnosis not present

## 2022-09-27 DIAGNOSIS — I440 Atrioventricular block, first degree: Secondary | ICD-10-CM | POA: Diagnosis not present

## 2022-09-28 DIAGNOSIS — J441 Chronic obstructive pulmonary disease with (acute) exacerbation: Secondary | ICD-10-CM | POA: Diagnosis not present

## 2022-09-28 DIAGNOSIS — G4733 Obstructive sleep apnea (adult) (pediatric): Secondary | ICD-10-CM | POA: Diagnosis not present

## 2022-09-28 DIAGNOSIS — J9611 Chronic respiratory failure with hypoxia: Secondary | ICD-10-CM | POA: Diagnosis not present

## 2022-09-28 DIAGNOSIS — I5030 Unspecified diastolic (congestive) heart failure: Secondary | ICD-10-CM | POA: Diagnosis not present

## 2022-09-28 DIAGNOSIS — N183 Chronic kidney disease, stage 3 unspecified: Secondary | ICD-10-CM | POA: Diagnosis not present

## 2022-09-28 DIAGNOSIS — E1122 Type 2 diabetes mellitus with diabetic chronic kidney disease: Secondary | ICD-10-CM | POA: Diagnosis not present

## 2022-09-28 DIAGNOSIS — D509 Iron deficiency anemia, unspecified: Secondary | ICD-10-CM | POA: Diagnosis not present

## 2022-09-28 DIAGNOSIS — I2510 Atherosclerotic heart disease of native coronary artery without angina pectoris: Secondary | ICD-10-CM | POA: Diagnosis not present

## 2022-09-28 DIAGNOSIS — E1140 Type 2 diabetes mellitus with diabetic neuropathy, unspecified: Secondary | ICD-10-CM | POA: Diagnosis not present

## 2022-09-28 DIAGNOSIS — I130 Hypertensive heart and chronic kidney disease with heart failure and stage 1 through stage 4 chronic kidney disease, or unspecified chronic kidney disease: Secondary | ICD-10-CM | POA: Diagnosis not present

## 2022-09-28 DIAGNOSIS — K922 Gastrointestinal hemorrhage, unspecified: Secondary | ICD-10-CM | POA: Diagnosis not present

## 2022-09-29 DIAGNOSIS — J9611 Chronic respiratory failure with hypoxia: Secondary | ICD-10-CM | POA: Diagnosis not present

## 2022-09-29 DIAGNOSIS — G4733 Obstructive sleep apnea (adult) (pediatric): Secondary | ICD-10-CM | POA: Diagnosis not present

## 2022-09-29 DIAGNOSIS — K922 Gastrointestinal hemorrhage, unspecified: Secondary | ICD-10-CM | POA: Diagnosis not present

## 2022-09-29 DIAGNOSIS — I2510 Atherosclerotic heart disease of native coronary artery without angina pectoris: Secondary | ICD-10-CM | POA: Diagnosis not present

## 2022-09-29 DIAGNOSIS — J441 Chronic obstructive pulmonary disease with (acute) exacerbation: Secondary | ICD-10-CM | POA: Diagnosis not present

## 2022-09-29 DIAGNOSIS — E1140 Type 2 diabetes mellitus with diabetic neuropathy, unspecified: Secondary | ICD-10-CM | POA: Diagnosis not present

## 2022-09-29 DIAGNOSIS — N183 Chronic kidney disease, stage 3 unspecified: Secondary | ICD-10-CM | POA: Diagnosis not present

## 2022-09-29 DIAGNOSIS — I5030 Unspecified diastolic (congestive) heart failure: Secondary | ICD-10-CM | POA: Diagnosis not present

## 2022-09-29 DIAGNOSIS — I130 Hypertensive heart and chronic kidney disease with heart failure and stage 1 through stage 4 chronic kidney disease, or unspecified chronic kidney disease: Secondary | ICD-10-CM | POA: Diagnosis not present

## 2022-09-29 DIAGNOSIS — E1122 Type 2 diabetes mellitus with diabetic chronic kidney disease: Secondary | ICD-10-CM | POA: Diagnosis not present

## 2022-09-29 DIAGNOSIS — D509 Iron deficiency anemia, unspecified: Secondary | ICD-10-CM | POA: Diagnosis not present

## 2022-09-30 DIAGNOSIS — K644 Residual hemorrhoidal skin tags: Secondary | ICD-10-CM | POA: Diagnosis not present

## 2022-09-30 DIAGNOSIS — K635 Polyp of colon: Secondary | ICD-10-CM | POA: Diagnosis not present

## 2022-09-30 DIAGNOSIS — K3189 Other diseases of stomach and duodenum: Secondary | ICD-10-CM | POA: Diagnosis not present

## 2022-09-30 DIAGNOSIS — Z9889 Other specified postprocedural states: Secondary | ICD-10-CM | POA: Diagnosis not present

## 2022-09-30 DIAGNOSIS — K921 Melena: Secondary | ICD-10-CM | POA: Diagnosis not present

## 2022-09-30 DIAGNOSIS — B3781 Candidal esophagitis: Secondary | ICD-10-CM | POA: Diagnosis not present

## 2022-09-30 DIAGNOSIS — K2289 Other specified disease of esophagus: Secondary | ICD-10-CM | POA: Diagnosis not present

## 2022-09-30 DIAGNOSIS — D123 Benign neoplasm of transverse colon: Secondary | ICD-10-CM | POA: Diagnosis not present

## 2022-09-30 DIAGNOSIS — K640 First degree hemorrhoids: Secondary | ICD-10-CM | POA: Diagnosis not present

## 2022-09-30 DIAGNOSIS — K319 Disease of stomach and duodenum, unspecified: Secondary | ICD-10-CM | POA: Diagnosis not present

## 2022-09-30 DIAGNOSIS — K6389 Other specified diseases of intestine: Secondary | ICD-10-CM | POA: Diagnosis not present

## 2022-09-30 DIAGNOSIS — D120 Benign neoplasm of cecum: Secondary | ICD-10-CM | POA: Diagnosis not present

## 2022-09-30 DIAGNOSIS — D500 Iron deficiency anemia secondary to blood loss (chronic): Secondary | ICD-10-CM | POA: Diagnosis not present

## 2022-09-30 DIAGNOSIS — J441 Chronic obstructive pulmonary disease with (acute) exacerbation: Secondary | ICD-10-CM | POA: Diagnosis not present

## 2022-10-01 ENCOUNTER — Ambulatory Visit: Payer: Medicare Other | Admitting: Internal Medicine

## 2022-10-15 DIAGNOSIS — J449 Chronic obstructive pulmonary disease, unspecified: Secondary | ICD-10-CM | POA: Diagnosis not present

## 2022-10-15 DIAGNOSIS — K648 Other hemorrhoids: Secondary | ICD-10-CM | POA: Diagnosis not present

## 2022-10-15 DIAGNOSIS — D120 Benign neoplasm of cecum: Secondary | ICD-10-CM | POA: Diagnosis not present

## 2022-10-23 DIAGNOSIS — J449 Chronic obstructive pulmonary disease, unspecified: Secondary | ICD-10-CM | POA: Diagnosis not present

## 2022-10-23 DIAGNOSIS — I5030 Unspecified diastolic (congestive) heart failure: Secondary | ICD-10-CM | POA: Diagnosis not present

## 2022-10-25 DIAGNOSIS — I5030 Unspecified diastolic (congestive) heart failure: Secondary | ICD-10-CM | POA: Diagnosis not present

## 2022-10-25 DIAGNOSIS — J449 Chronic obstructive pulmonary disease, unspecified: Secondary | ICD-10-CM | POA: Diagnosis not present

## 2022-11-09 DIAGNOSIS — B351 Tinea unguium: Secondary | ICD-10-CM | POA: Diagnosis not present

## 2022-11-09 DIAGNOSIS — M2042 Other hammer toe(s) (acquired), left foot: Secondary | ICD-10-CM | POA: Diagnosis not present

## 2022-11-09 DIAGNOSIS — L84 Corns and callosities: Secondary | ICD-10-CM | POA: Diagnosis not present

## 2022-11-09 DIAGNOSIS — E0840 Diabetes mellitus due to underlying condition with diabetic neuropathy, unspecified: Secondary | ICD-10-CM | POA: Diagnosis not present

## 2022-11-09 DIAGNOSIS — M2041 Other hammer toe(s) (acquired), right foot: Secondary | ICD-10-CM | POA: Diagnosis not present

## 2022-11-10 DIAGNOSIS — D490 Neoplasm of unspecified behavior of digestive system: Secondary | ICD-10-CM | POA: Insufficient documentation

## 2022-11-10 HISTORY — DX: Neoplasm of unspecified behavior of digestive system: D49.0

## 2022-11-23 DIAGNOSIS — I5030 Unspecified diastolic (congestive) heart failure: Secondary | ICD-10-CM | POA: Diagnosis not present

## 2022-11-23 DIAGNOSIS — J449 Chronic obstructive pulmonary disease, unspecified: Secondary | ICD-10-CM | POA: Diagnosis not present

## 2022-11-25 DIAGNOSIS — J449 Chronic obstructive pulmonary disease, unspecified: Secondary | ICD-10-CM | POA: Diagnosis not present

## 2022-11-25 DIAGNOSIS — I5030 Unspecified diastolic (congestive) heart failure: Secondary | ICD-10-CM | POA: Diagnosis not present

## 2022-12-07 ENCOUNTER — Telehealth: Payer: Self-pay | Admitting: Internal Medicine

## 2022-12-07 NOTE — Telephone Encounter (Signed)
Contacted Nathaniel Little to schedule their annual wellness visit. Patient declined to schedule AWV at this time. He moved to [place] has new pcp Berenda Morale MD   *Sherol Dade; [place] Direct Dial: [phone]

## 2022-12-14 DIAGNOSIS — I509 Heart failure, unspecified: Secondary | ICD-10-CM | POA: Diagnosis not present

## 2022-12-14 DIAGNOSIS — K9189 Other postprocedural complications and disorders of digestive system: Secondary | ICD-10-CM | POA: Diagnosis not present

## 2022-12-14 DIAGNOSIS — Z01818 Encounter for other preprocedural examination: Secondary | ICD-10-CM | POA: Diagnosis not present

## 2022-12-14 DIAGNOSIS — Z90411 Acquired partial absence of pancreas: Secondary | ICD-10-CM | POA: Diagnosis not present

## 2022-12-14 DIAGNOSIS — K567 Ileus, unspecified: Secondary | ICD-10-CM | POA: Diagnosis not present

## 2022-12-14 DIAGNOSIS — Z952 Presence of prosthetic heart valve: Secondary | ICD-10-CM | POA: Diagnosis not present

## 2022-12-14 DIAGNOSIS — K660 Peritoneal adhesions (postprocedural) (postinfection): Secondary | ICD-10-CM | POA: Diagnosis not present

## 2022-12-14 DIAGNOSIS — I2510 Atherosclerotic heart disease of native coronary artery without angina pectoris: Secondary | ICD-10-CM | POA: Diagnosis not present

## 2022-12-14 DIAGNOSIS — E1151 Type 2 diabetes mellitus with diabetic peripheral angiopathy without gangrene: Secondary | ICD-10-CM | POA: Diagnosis not present

## 2022-12-14 DIAGNOSIS — Z7982 Long term (current) use of aspirin: Secondary | ICD-10-CM | POA: Diagnosis not present

## 2022-12-14 DIAGNOSIS — Z902 Acquired absence of lung [part of]: Secondary | ICD-10-CM | POA: Diagnosis not present

## 2022-12-14 DIAGNOSIS — Z7984 Long term (current) use of oral hypoglycemic drugs: Secondary | ICD-10-CM | POA: Diagnosis not present

## 2022-12-14 DIAGNOSIS — I110 Hypertensive heart disease with heart failure: Secondary | ICD-10-CM | POA: Diagnosis not present

## 2022-12-14 DIAGNOSIS — I350 Nonrheumatic aortic (valve) stenosis: Secondary | ICD-10-CM | POA: Diagnosis not present

## 2022-12-14 DIAGNOSIS — Z87891 Personal history of nicotine dependence: Secondary | ICD-10-CM | POA: Diagnosis not present

## 2022-12-14 DIAGNOSIS — D120 Benign neoplasm of cecum: Secondary | ICD-10-CM | POA: Diagnosis not present

## 2022-12-14 DIAGNOSIS — Z9981 Dependence on supplemental oxygen: Secondary | ICD-10-CM | POA: Diagnosis not present

## 2022-12-14 DIAGNOSIS — J449 Chronic obstructive pulmonary disease, unspecified: Secondary | ICD-10-CM | POA: Diagnosis not present

## 2022-12-14 DIAGNOSIS — K219 Gastro-esophageal reflux disease without esophagitis: Secondary | ICD-10-CM | POA: Diagnosis not present

## 2022-12-14 DIAGNOSIS — Z4682 Encounter for fitting and adjustment of non-vascular catheter: Secondary | ICD-10-CM | POA: Diagnosis not present

## 2022-12-14 DIAGNOSIS — Z85118 Personal history of other malignant neoplasm of bronchus and lung: Secondary | ICD-10-CM | POA: Diagnosis not present

## 2022-12-14 DIAGNOSIS — D490 Neoplasm of unspecified behavior of digestive system: Secondary | ICD-10-CM | POA: Diagnosis not present

## 2022-12-14 DIAGNOSIS — E785 Hyperlipidemia, unspecified: Secondary | ICD-10-CM | POA: Diagnosis not present

## 2022-12-14 DIAGNOSIS — N179 Acute kidney failure, unspecified: Secondary | ICD-10-CM | POA: Diagnosis not present

## 2022-12-14 DIAGNOSIS — Z923 Personal history of irradiation: Secondary | ICD-10-CM | POA: Diagnosis not present

## 2022-12-14 DIAGNOSIS — E119 Type 2 diabetes mellitus without complications: Secondary | ICD-10-CM | POA: Diagnosis not present

## 2022-12-14 DIAGNOSIS — D122 Benign neoplasm of ascending colon: Secondary | ICD-10-CM | POA: Diagnosis not present

## 2022-12-22 DIAGNOSIS — I5030 Unspecified diastolic (congestive) heart failure: Secondary | ICD-10-CM | POA: Diagnosis not present

## 2022-12-22 DIAGNOSIS — J449 Chronic obstructive pulmonary disease, unspecified: Secondary | ICD-10-CM | POA: Diagnosis not present

## 2022-12-24 DIAGNOSIS — J449 Chronic obstructive pulmonary disease, unspecified: Secondary | ICD-10-CM | POA: Diagnosis not present

## 2022-12-24 DIAGNOSIS — I5030 Unspecified diastolic (congestive) heart failure: Secondary | ICD-10-CM | POA: Diagnosis not present

## 2022-12-29 ENCOUNTER — Ambulatory Visit: Payer: 59 | Admitting: Internal Medicine

## 2022-12-29 DIAGNOSIS — Z09 Encounter for follow-up examination after completed treatment for conditions other than malignant neoplasm: Secondary | ICD-10-CM | POA: Diagnosis not present

## 2023-01-17 DIAGNOSIS — M79675 Pain in left toe(s): Secondary | ICD-10-CM | POA: Diagnosis not present

## 2023-01-17 DIAGNOSIS — M2042 Other hammer toe(s) (acquired), left foot: Secondary | ICD-10-CM | POA: Diagnosis not present

## 2023-01-17 DIAGNOSIS — L84 Corns and callosities: Secondary | ICD-10-CM | POA: Diagnosis not present

## 2023-01-17 DIAGNOSIS — B351 Tinea unguium: Secondary | ICD-10-CM | POA: Diagnosis not present

## 2023-01-17 DIAGNOSIS — E0840 Diabetes mellitus due to underlying condition with diabetic neuropathy, unspecified: Secondary | ICD-10-CM | POA: Diagnosis not present

## 2023-01-22 DIAGNOSIS — I5030 Unspecified diastolic (congestive) heart failure: Secondary | ICD-10-CM | POA: Diagnosis not present

## 2023-01-22 DIAGNOSIS — J449 Chronic obstructive pulmonary disease, unspecified: Secondary | ICD-10-CM | POA: Diagnosis not present

## 2023-01-24 DIAGNOSIS — J449 Chronic obstructive pulmonary disease, unspecified: Secondary | ICD-10-CM | POA: Diagnosis not present

## 2023-01-24 DIAGNOSIS — I5030 Unspecified diastolic (congestive) heart failure: Secondary | ICD-10-CM | POA: Diagnosis not present

## 2023-02-04 DIAGNOSIS — M1611 Unilateral primary osteoarthritis, right hip: Secondary | ICD-10-CM | POA: Diagnosis not present

## 2023-02-04 DIAGNOSIS — G250 Essential tremor: Secondary | ICD-10-CM | POA: Diagnosis not present

## 2023-02-04 DIAGNOSIS — I10 Essential (primary) hypertension: Secondary | ICD-10-CM | POA: Diagnosis not present

## 2023-02-08 DIAGNOSIS — R2689 Other abnormalities of gait and mobility: Secondary | ICD-10-CM | POA: Diagnosis not present

## 2023-02-08 DIAGNOSIS — M25551 Pain in right hip: Secondary | ICD-10-CM | POA: Diagnosis not present

## 2023-02-08 DIAGNOSIS — M1611 Unilateral primary osteoarthritis, right hip: Secondary | ICD-10-CM | POA: Diagnosis not present

## 2023-02-11 DIAGNOSIS — M25551 Pain in right hip: Secondary | ICD-10-CM | POA: Diagnosis not present

## 2023-02-11 DIAGNOSIS — R2689 Other abnormalities of gait and mobility: Secondary | ICD-10-CM | POA: Diagnosis not present

## 2023-02-11 DIAGNOSIS — M1611 Unilateral primary osteoarthritis, right hip: Secondary | ICD-10-CM | POA: Diagnosis not present

## 2023-02-15 DIAGNOSIS — M1611 Unilateral primary osteoarthritis, right hip: Secondary | ICD-10-CM | POA: Diagnosis not present

## 2023-02-15 DIAGNOSIS — R2689 Other abnormalities of gait and mobility: Secondary | ICD-10-CM | POA: Diagnosis not present

## 2023-02-15 DIAGNOSIS — M25551 Pain in right hip: Secondary | ICD-10-CM | POA: Diagnosis not present

## 2023-02-17 DIAGNOSIS — M1611 Unilateral primary osteoarthritis, right hip: Secondary | ICD-10-CM | POA: Diagnosis not present

## 2023-02-17 DIAGNOSIS — Z87891 Personal history of nicotine dependence: Secondary | ICD-10-CM | POA: Diagnosis not present

## 2023-02-18 DIAGNOSIS — R2689 Other abnormalities of gait and mobility: Secondary | ICD-10-CM | POA: Diagnosis not present

## 2023-02-18 DIAGNOSIS — M1611 Unilateral primary osteoarthritis, right hip: Secondary | ICD-10-CM | POA: Diagnosis not present

## 2023-02-18 DIAGNOSIS — M25551 Pain in right hip: Secondary | ICD-10-CM | POA: Diagnosis not present

## 2023-02-21 DIAGNOSIS — I5030 Unspecified diastolic (congestive) heart failure: Secondary | ICD-10-CM | POA: Diagnosis not present

## 2023-02-21 DIAGNOSIS — J449 Chronic obstructive pulmonary disease, unspecified: Secondary | ICD-10-CM | POA: Diagnosis not present

## 2023-02-22 DIAGNOSIS — M1611 Unilateral primary osteoarthritis, right hip: Secondary | ICD-10-CM | POA: Diagnosis not present

## 2023-02-22 DIAGNOSIS — M25551 Pain in right hip: Secondary | ICD-10-CM | POA: Diagnosis not present

## 2023-02-22 DIAGNOSIS — R2689 Other abnormalities of gait and mobility: Secondary | ICD-10-CM | POA: Diagnosis not present

## 2023-02-23 DIAGNOSIS — I5030 Unspecified diastolic (congestive) heart failure: Secondary | ICD-10-CM | POA: Diagnosis not present

## 2023-02-23 DIAGNOSIS — J449 Chronic obstructive pulmonary disease, unspecified: Secondary | ICD-10-CM | POA: Diagnosis not present

## 2023-02-25 DIAGNOSIS — R2689 Other abnormalities of gait and mobility: Secondary | ICD-10-CM | POA: Diagnosis not present

## 2023-02-25 DIAGNOSIS — M1611 Unilateral primary osteoarthritis, right hip: Secondary | ICD-10-CM | POA: Diagnosis not present

## 2023-02-25 DIAGNOSIS — M25551 Pain in right hip: Secondary | ICD-10-CM | POA: Diagnosis not present

## 2023-03-01 DIAGNOSIS — M1611 Unilateral primary osteoarthritis, right hip: Secondary | ICD-10-CM | POA: Diagnosis not present

## 2023-03-01 DIAGNOSIS — R2689 Other abnormalities of gait and mobility: Secondary | ICD-10-CM | POA: Diagnosis not present

## 2023-03-01 DIAGNOSIS — M25551 Pain in right hip: Secondary | ICD-10-CM | POA: Diagnosis not present

## 2023-03-04 DIAGNOSIS — M25551 Pain in right hip: Secondary | ICD-10-CM | POA: Diagnosis not present

## 2023-03-04 DIAGNOSIS — R2689 Other abnormalities of gait and mobility: Secondary | ICD-10-CM | POA: Diagnosis not present

## 2023-03-04 DIAGNOSIS — M1611 Unilateral primary osteoarthritis, right hip: Secondary | ICD-10-CM | POA: Diagnosis not present

## 2023-03-06 DIAGNOSIS — M1611 Unilateral primary osteoarthritis, right hip: Secondary | ICD-10-CM | POA: Diagnosis not present

## 2023-03-06 DIAGNOSIS — I10 Essential (primary) hypertension: Secondary | ICD-10-CM | POA: Diagnosis not present

## 2023-03-07 IMAGING — CT CT CHEST W/O CM
2 of 4 series · 15 of 36 positions shown, 18 images · non-contrast
Comparison: 04/14/2021.

CLINICAL DATA: Lung cancer. Shortness of breath on exertion.
History of Whipple procedure.

EXAM:
CT CHEST WITHOUT CONTRAST
TECHNIQUE: Multidetector CT imaging of the chest was performed following the
standard protocol without IV contrast.

[Series 2: chest 2.00 · axial · 0.72mm/px · z∈[-1243,-935]mm · 12 of 182 slices shown, 15 images]
[im 14/182  mediastinal]
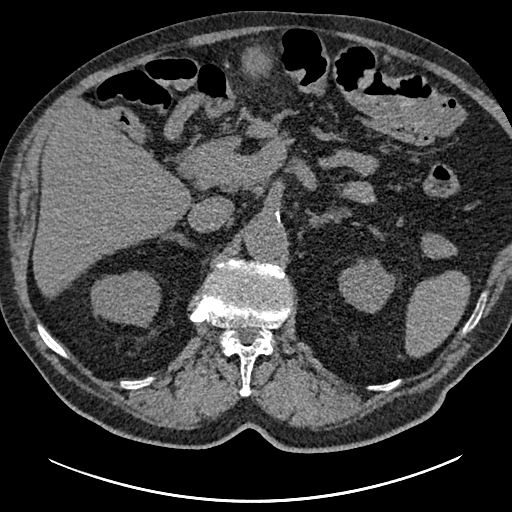
[im 14/182  lung]
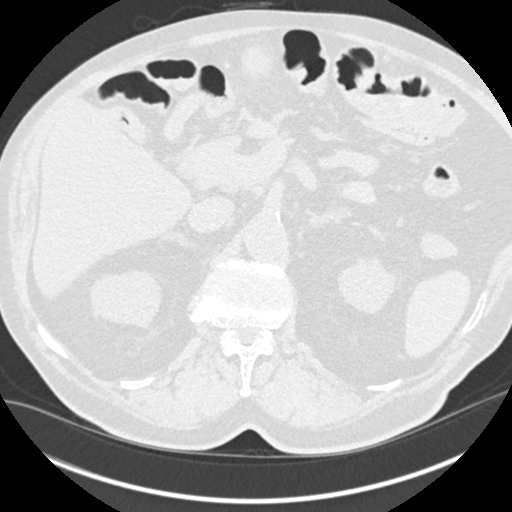
[im 28/182  lung]
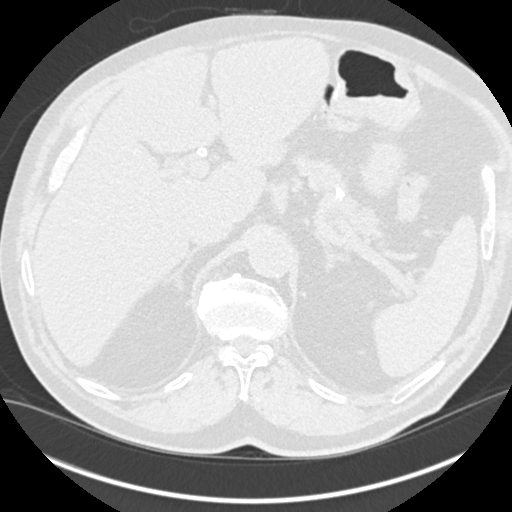
[im 42/182  lung]
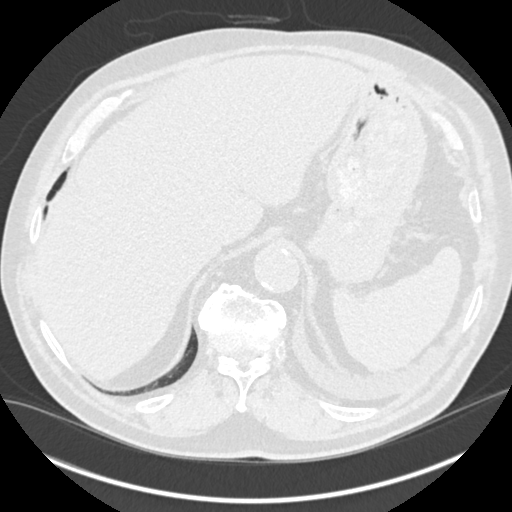
[im 56/182  lung]
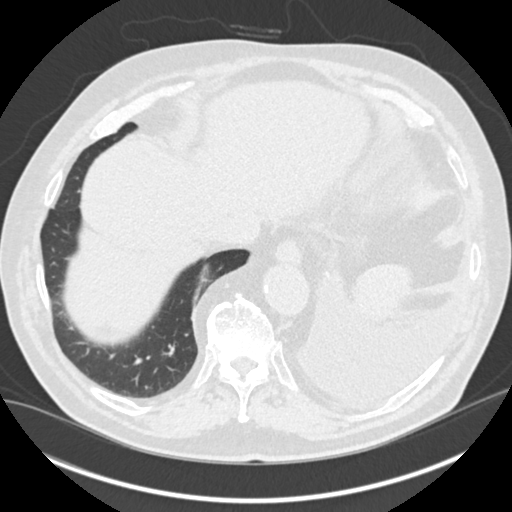
[im 70/182  mediastinal]
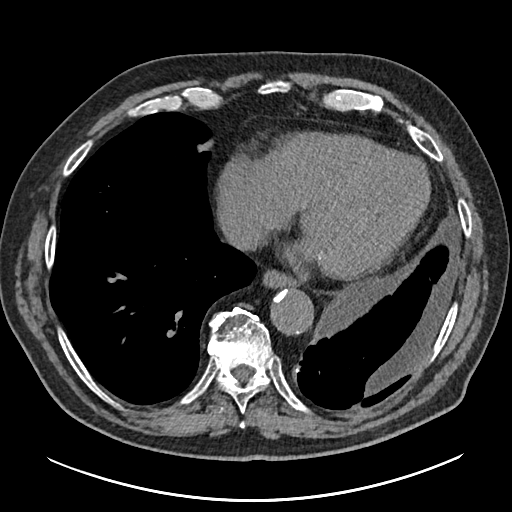
[im 70/182  lung]
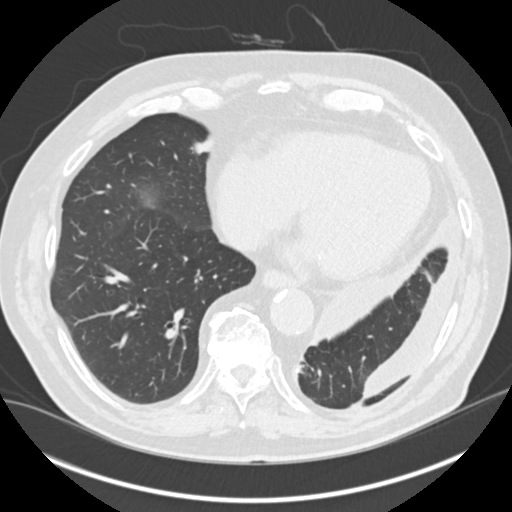
[im 84/182  lung]
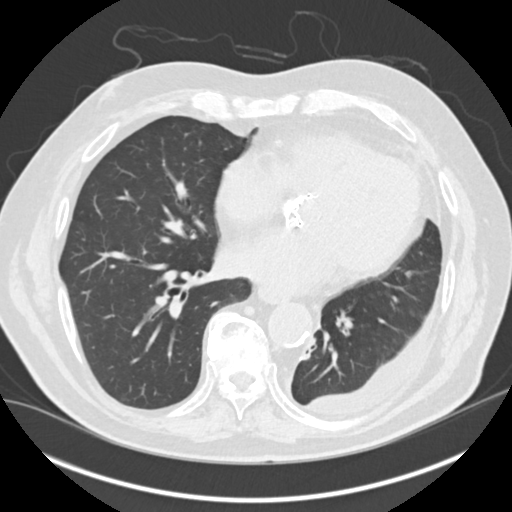
[im 98/182  lung]
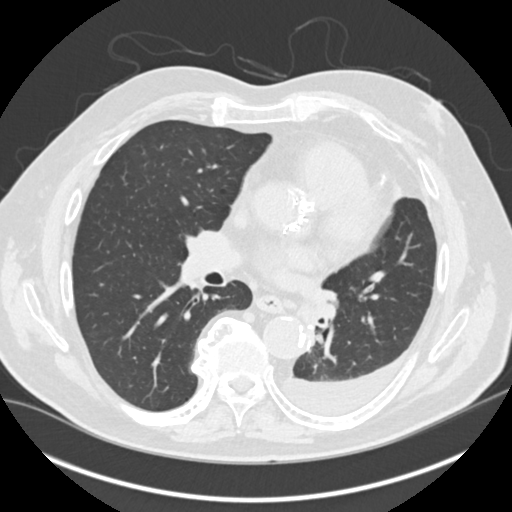
[im 112/182  lung]
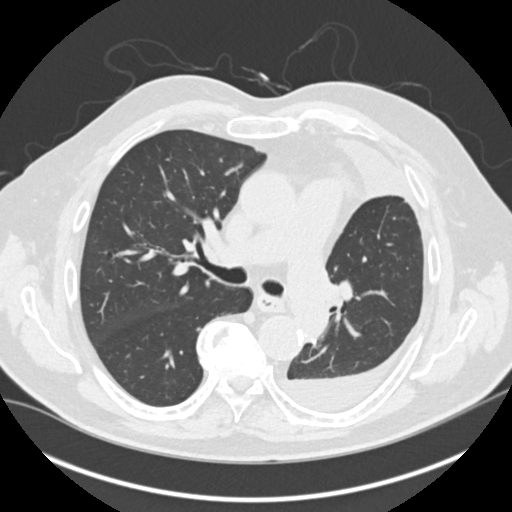
[im 126/182  mediastinal]
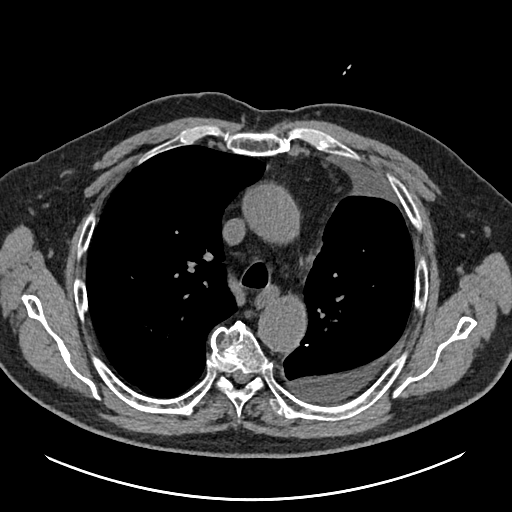
[im 126/182  lung]
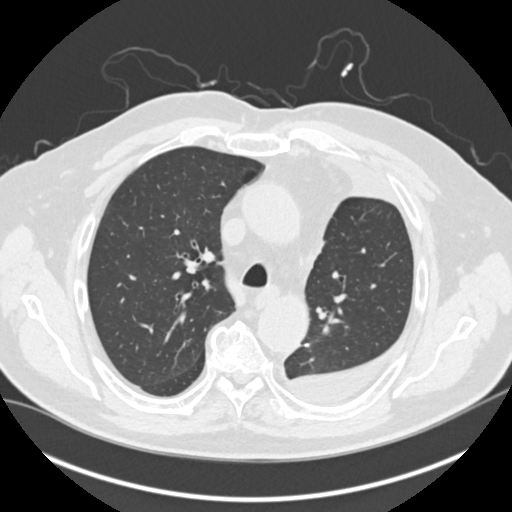
[im 140/182  lung]
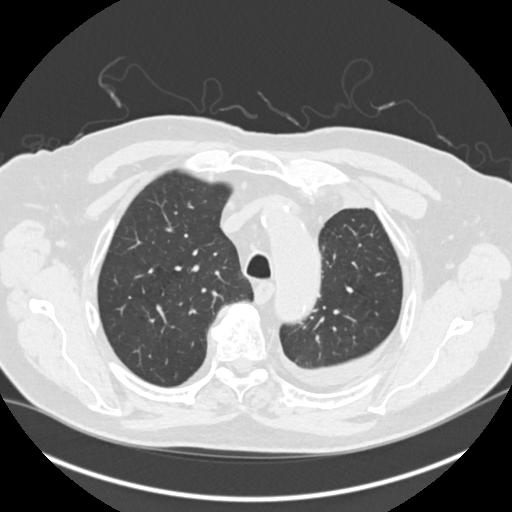
[im 154/182  lung]
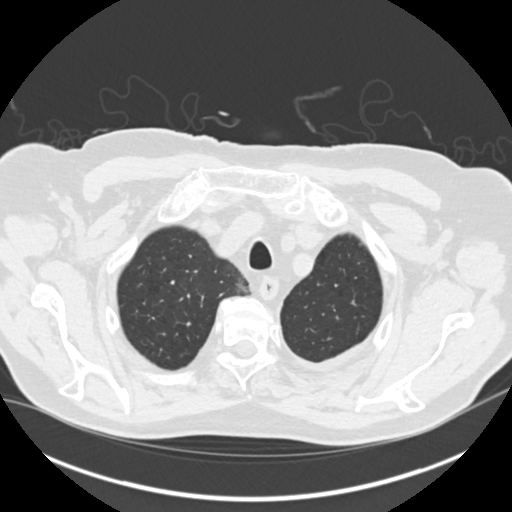
[im 168/182  lung]
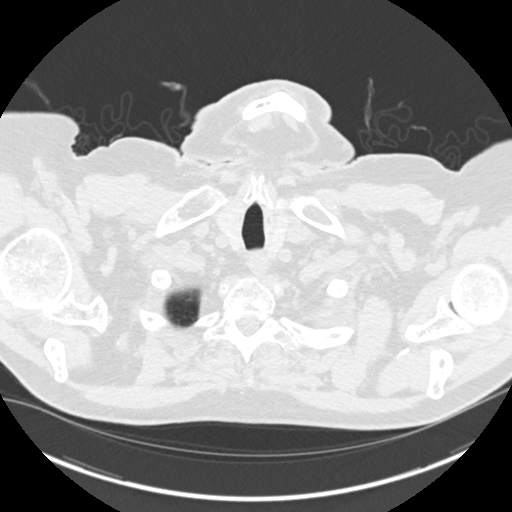

[Series 5: coronals chest 2.00 cor · coronal · 0.71mm/px · 3 of 157 slices shown]
[im 32/157  lung]
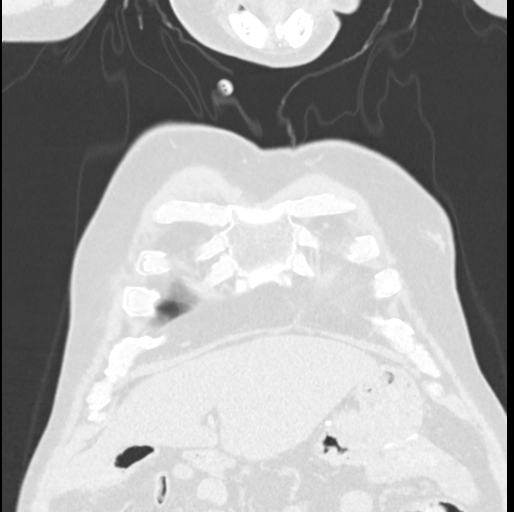
[im 63/157  lung]
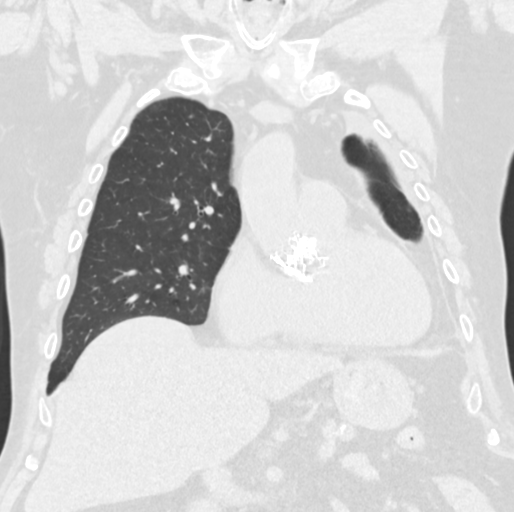
[im 94/157  lung]
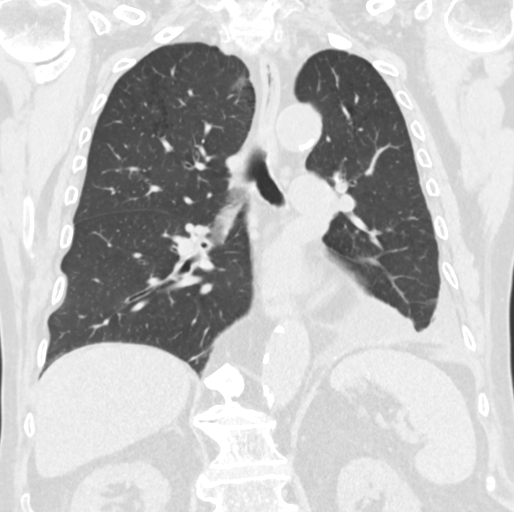

[15 of 36 positions shown; findings below may reference images not displayed]

FINDINGS: Cardiovascular: Atherosclerotic calcification of the aorta. Aortic
valve replacement. Coronary artery calcification. Pulmonic trunk and
heart are enlarged. No pericardial effusion.

Mediastinum/Nodes: Mediastinal lymph nodes are not enlarged by CT
size criteria. Hilar regions are difficult to evaluate without IV
contrast. No axillary adenopathy. Esophagus is grossly unremarkable.

Lungs/Pleura: Centrilobular emphysema. Smoking related respiratory
bronchiolitis. Moderate loculated left pleural effusion, similar. 4
mm subpleural lymph node along the minor fissure, unchanged. Left
lower lobectomy. Airway is otherwise unremarkable.

Upper Abdomen: Visualized portion of the liver is unremarkable.
Common bile duct stent in place. Right adrenal gland is
unremarkable. Slight thickening of the left adrenal gland. Tiny
right renal stones. 12 mm low-attenuation lesion off the upper pole
right kidney is too small to definitively characterize. Peripherally
calcified subcentimeter exophytic lesion off the upper pole left
kidney, also too small to characterize. Spleen is unremarkable.
Whipple procedure. No upper abdominal adenopathy.

Musculoskeletal: Degenerative changes in the spine. Flowing anterior
osteophytosis in the thoracic spine. No worrisome lytic or sclerotic
lesions.
IMPRESSION: 1. No evidence of recurrent or metastatic disease.
2. Moderate loculated left pleural effusion, similar.
3. Tiny right renal stones.
4. Aortic atherosclerosis ([id number]-[id number]). Coronary artery
calcification.
5. Enlarged pulmonic trunk, indicative of pulmonary arterial
hypertension.
6.  Emphysema ([id number]).

## 2023-03-08 DIAGNOSIS — M1611 Unilateral primary osteoarthritis, right hip: Secondary | ICD-10-CM | POA: Diagnosis not present

## 2023-03-08 DIAGNOSIS — M25551 Pain in right hip: Secondary | ICD-10-CM | POA: Diagnosis not present

## 2023-03-08 DIAGNOSIS — R2689 Other abnormalities of gait and mobility: Secondary | ICD-10-CM | POA: Diagnosis not present

## 2023-03-11 DIAGNOSIS — M1611 Unilateral primary osteoarthritis, right hip: Secondary | ICD-10-CM | POA: Diagnosis not present

## 2023-03-11 DIAGNOSIS — J449 Chronic obstructive pulmonary disease, unspecified: Secondary | ICD-10-CM | POA: Diagnosis not present

## 2023-03-11 DIAGNOSIS — R2689 Other abnormalities of gait and mobility: Secondary | ICD-10-CM | POA: Diagnosis not present

## 2023-03-11 DIAGNOSIS — M25551 Pain in right hip: Secondary | ICD-10-CM | POA: Diagnosis not present

## 2023-03-15 DIAGNOSIS — R2689 Other abnormalities of gait and mobility: Secondary | ICD-10-CM | POA: Diagnosis not present

## 2023-03-15 DIAGNOSIS — M1611 Unilateral primary osteoarthritis, right hip: Secondary | ICD-10-CM | POA: Diagnosis not present

## 2023-03-15 DIAGNOSIS — M25551 Pain in right hip: Secondary | ICD-10-CM | POA: Diagnosis not present

## 2023-03-18 DIAGNOSIS — M25551 Pain in right hip: Secondary | ICD-10-CM | POA: Diagnosis not present

## 2023-03-18 DIAGNOSIS — M1611 Unilateral primary osteoarthritis, right hip: Secondary | ICD-10-CM | POA: Diagnosis not present

## 2023-03-18 DIAGNOSIS — R2689 Other abnormalities of gait and mobility: Secondary | ICD-10-CM | POA: Diagnosis not present

## 2023-03-22 DIAGNOSIS — M25551 Pain in right hip: Secondary | ICD-10-CM | POA: Diagnosis not present

## 2023-03-22 DIAGNOSIS — M1611 Unilateral primary osteoarthritis, right hip: Secondary | ICD-10-CM | POA: Diagnosis not present

## 2023-03-22 DIAGNOSIS — R2689 Other abnormalities of gait and mobility: Secondary | ICD-10-CM | POA: Diagnosis not present

## 2023-03-24 DIAGNOSIS — J449 Chronic obstructive pulmonary disease, unspecified: Secondary | ICD-10-CM | POA: Diagnosis not present

## 2023-03-24 DIAGNOSIS — I5030 Unspecified diastolic (congestive) heart failure: Secondary | ICD-10-CM | POA: Diagnosis not present

## 2023-03-25 DIAGNOSIS — R2689 Other abnormalities of gait and mobility: Secondary | ICD-10-CM | POA: Diagnosis not present

## 2023-03-25 DIAGNOSIS — M1611 Unilateral primary osteoarthritis, right hip: Secondary | ICD-10-CM | POA: Diagnosis not present

## 2023-03-25 DIAGNOSIS — M25551 Pain in right hip: Secondary | ICD-10-CM | POA: Diagnosis not present

## 2023-03-26 DIAGNOSIS — I5030 Unspecified diastolic (congestive) heart failure: Secondary | ICD-10-CM | POA: Diagnosis not present

## 2023-03-26 DIAGNOSIS — J449 Chronic obstructive pulmonary disease, unspecified: Secondary | ICD-10-CM | POA: Diagnosis not present

## 2023-03-29 DIAGNOSIS — M25551 Pain in right hip: Secondary | ICD-10-CM | POA: Diagnosis not present

## 2023-03-29 DIAGNOSIS — M1611 Unilateral primary osteoarthritis, right hip: Secondary | ICD-10-CM | POA: Diagnosis not present

## 2023-03-29 DIAGNOSIS — R2689 Other abnormalities of gait and mobility: Secondary | ICD-10-CM | POA: Diagnosis not present

## 2023-04-01 DIAGNOSIS — M1611 Unilateral primary osteoarthritis, right hip: Secondary | ICD-10-CM | POA: Diagnosis not present

## 2023-04-01 DIAGNOSIS — M25551 Pain in right hip: Secondary | ICD-10-CM | POA: Diagnosis not present

## 2023-04-01 DIAGNOSIS — R2689 Other abnormalities of gait and mobility: Secondary | ICD-10-CM | POA: Diagnosis not present

## 2023-04-04 DIAGNOSIS — M1611 Unilateral primary osteoarthritis, right hip: Secondary | ICD-10-CM | POA: Diagnosis not present

## 2023-04-04 DIAGNOSIS — Z122 Encounter for screening for malignant neoplasm of respiratory organs: Secondary | ICD-10-CM | POA: Diagnosis not present

## 2023-04-04 DIAGNOSIS — F17210 Nicotine dependence, cigarettes, uncomplicated: Secondary | ICD-10-CM | POA: Diagnosis not present

## 2023-04-04 DIAGNOSIS — I10 Essential (primary) hypertension: Secondary | ICD-10-CM | POA: Diagnosis not present

## 2023-04-05 DIAGNOSIS — R2689 Other abnormalities of gait and mobility: Secondary | ICD-10-CM | POA: Diagnosis not present

## 2023-04-05 DIAGNOSIS — M25551 Pain in right hip: Secondary | ICD-10-CM | POA: Diagnosis not present

## 2023-04-05 DIAGNOSIS — M1611 Unilateral primary osteoarthritis, right hip: Secondary | ICD-10-CM | POA: Diagnosis not present

## 2023-04-10 DIAGNOSIS — J449 Chronic obstructive pulmonary disease, unspecified: Secondary | ICD-10-CM | POA: Diagnosis not present

## 2023-04-12 DIAGNOSIS — M1611 Unilateral primary osteoarthritis, right hip: Secondary | ICD-10-CM | POA: Diagnosis not present

## 2023-04-12 DIAGNOSIS — M25551 Pain in right hip: Secondary | ICD-10-CM | POA: Diagnosis not present

## 2023-04-12 DIAGNOSIS — R2689 Other abnormalities of gait and mobility: Secondary | ICD-10-CM | POA: Diagnosis not present

## 2023-04-14 DIAGNOSIS — M1611 Unilateral primary osteoarthritis, right hip: Secondary | ICD-10-CM | POA: Diagnosis not present

## 2023-04-14 DIAGNOSIS — M25551 Pain in right hip: Secondary | ICD-10-CM | POA: Diagnosis not present

## 2023-04-14 DIAGNOSIS — R2689 Other abnormalities of gait and mobility: Secondary | ICD-10-CM | POA: Diagnosis not present

## 2023-04-19 DIAGNOSIS — M545 Low back pain, unspecified: Secondary | ICD-10-CM | POA: Diagnosis not present

## 2023-04-19 DIAGNOSIS — M1611 Unilateral primary osteoarthritis, right hip: Secondary | ICD-10-CM | POA: Diagnosis not present

## 2023-04-19 DIAGNOSIS — M7061 Trochanteric bursitis, right hip: Secondary | ICD-10-CM | POA: Diagnosis not present

## 2023-04-23 DIAGNOSIS — I5030 Unspecified diastolic (congestive) heart failure: Secondary | ICD-10-CM | POA: Diagnosis not present

## 2023-04-23 DIAGNOSIS — J449 Chronic obstructive pulmonary disease, unspecified: Secondary | ICD-10-CM | POA: Diagnosis not present

## 2023-04-25 DIAGNOSIS — R2689 Other abnormalities of gait and mobility: Secondary | ICD-10-CM | POA: Diagnosis not present

## 2023-04-25 DIAGNOSIS — J449 Chronic obstructive pulmonary disease, unspecified: Secondary | ICD-10-CM | POA: Diagnosis not present

## 2023-04-25 DIAGNOSIS — M1611 Unilateral primary osteoarthritis, right hip: Secondary | ICD-10-CM | POA: Diagnosis not present

## 2023-04-25 DIAGNOSIS — M545 Low back pain, unspecified: Secondary | ICD-10-CM | POA: Diagnosis not present

## 2023-04-25 DIAGNOSIS — I5030 Unspecified diastolic (congestive) heart failure: Secondary | ICD-10-CM | POA: Diagnosis not present

## 2023-04-25 DIAGNOSIS — M25551 Pain in right hip: Secondary | ICD-10-CM | POA: Diagnosis not present

## 2023-04-27 DIAGNOSIS — M1611 Unilateral primary osteoarthritis, right hip: Secondary | ICD-10-CM | POA: Diagnosis not present

## 2023-04-27 DIAGNOSIS — M545 Low back pain, unspecified: Secondary | ICD-10-CM | POA: Diagnosis not present

## 2023-04-27 DIAGNOSIS — R2689 Other abnormalities of gait and mobility: Secondary | ICD-10-CM | POA: Diagnosis not present

## 2023-04-27 DIAGNOSIS — M25551 Pain in right hip: Secondary | ICD-10-CM | POA: Diagnosis not present

## 2023-05-09 DIAGNOSIS — M79675 Pain in left toe(s): Secondary | ICD-10-CM | POA: Diagnosis not present

## 2023-05-09 DIAGNOSIS — L84 Corns and callosities: Secondary | ICD-10-CM | POA: Diagnosis not present

## 2023-05-09 DIAGNOSIS — B351 Tinea unguium: Secondary | ICD-10-CM | POA: Diagnosis not present

## 2023-05-09 DIAGNOSIS — M79661 Pain in right lower leg: Secondary | ICD-10-CM | POA: Diagnosis not present

## 2023-05-09 DIAGNOSIS — M65871 Other synovitis and tenosynovitis, right ankle and foot: Secondary | ICD-10-CM | POA: Diagnosis not present

## 2023-05-09 DIAGNOSIS — E0840 Diabetes mellitus due to underlying condition with diabetic neuropathy, unspecified: Secondary | ICD-10-CM | POA: Diagnosis not present

## 2023-05-10 DIAGNOSIS — M545 Low back pain, unspecified: Secondary | ICD-10-CM | POA: Diagnosis not present

## 2023-05-10 DIAGNOSIS — M1611 Unilateral primary osteoarthritis, right hip: Secondary | ICD-10-CM | POA: Diagnosis not present

## 2023-05-10 DIAGNOSIS — M25551 Pain in right hip: Secondary | ICD-10-CM | POA: Diagnosis not present

## 2023-05-10 DIAGNOSIS — R2689 Other abnormalities of gait and mobility: Secondary | ICD-10-CM | POA: Diagnosis not present

## 2023-05-11 DIAGNOSIS — J449 Chronic obstructive pulmonary disease, unspecified: Secondary | ICD-10-CM | POA: Diagnosis not present

## 2023-05-17 DIAGNOSIS — M25551 Pain in right hip: Secondary | ICD-10-CM | POA: Diagnosis not present

## 2023-05-17 DIAGNOSIS — M545 Low back pain, unspecified: Secondary | ICD-10-CM | POA: Diagnosis not present

## 2023-05-17 DIAGNOSIS — R2689 Other abnormalities of gait and mobility: Secondary | ICD-10-CM | POA: Diagnosis not present

## 2023-05-17 DIAGNOSIS — M1611 Unilateral primary osteoarthritis, right hip: Secondary | ICD-10-CM | POA: Diagnosis not present

## 2023-05-20 DIAGNOSIS — M25551 Pain in right hip: Secondary | ICD-10-CM | POA: Diagnosis not present

## 2023-05-20 DIAGNOSIS — M545 Low back pain, unspecified: Secondary | ICD-10-CM | POA: Diagnosis not present

## 2023-05-20 DIAGNOSIS — R2689 Other abnormalities of gait and mobility: Secondary | ICD-10-CM | POA: Diagnosis not present

## 2023-05-20 DIAGNOSIS — M1611 Unilateral primary osteoarthritis, right hip: Secondary | ICD-10-CM | POA: Diagnosis not present

## 2023-05-23 DIAGNOSIS — M1611 Unilateral primary osteoarthritis, right hip: Secondary | ICD-10-CM | POA: Diagnosis not present

## 2023-05-24 DIAGNOSIS — I5030 Unspecified diastolic (congestive) heart failure: Secondary | ICD-10-CM | POA: Diagnosis not present

## 2023-05-24 DIAGNOSIS — J449 Chronic obstructive pulmonary disease, unspecified: Secondary | ICD-10-CM | POA: Diagnosis not present

## 2023-05-26 DIAGNOSIS — I5030 Unspecified diastolic (congestive) heart failure: Secondary | ICD-10-CM | POA: Diagnosis not present

## 2023-05-26 DIAGNOSIS — H26492 Other secondary cataract, left eye: Secondary | ICD-10-CM | POA: Diagnosis not present

## 2023-05-26 DIAGNOSIS — E119 Type 2 diabetes mellitus without complications: Secondary | ICD-10-CM | POA: Diagnosis not present

## 2023-05-26 DIAGNOSIS — H25811 Combined forms of age-related cataract, right eye: Secondary | ICD-10-CM | POA: Diagnosis not present

## 2023-05-26 DIAGNOSIS — J449 Chronic obstructive pulmonary disease, unspecified: Secondary | ICD-10-CM | POA: Diagnosis not present

## 2023-05-27 DIAGNOSIS — M1611 Unilateral primary osteoarthritis, right hip: Secondary | ICD-10-CM | POA: Diagnosis not present

## 2023-05-27 DIAGNOSIS — R2689 Other abnormalities of gait and mobility: Secondary | ICD-10-CM | POA: Diagnosis not present

## 2023-05-27 DIAGNOSIS — M545 Low back pain, unspecified: Secondary | ICD-10-CM | POA: Diagnosis not present

## 2023-05-27 DIAGNOSIS — M25551 Pain in right hip: Secondary | ICD-10-CM | POA: Diagnosis not present

## 2023-05-31 DIAGNOSIS — M1611 Unilateral primary osteoarthritis, right hip: Secondary | ICD-10-CM | POA: Diagnosis not present

## 2023-05-31 DIAGNOSIS — M25551 Pain in right hip: Secondary | ICD-10-CM | POA: Diagnosis not present

## 2023-05-31 DIAGNOSIS — M545 Low back pain, unspecified: Secondary | ICD-10-CM | POA: Diagnosis not present

## 2023-05-31 DIAGNOSIS — R2689 Other abnormalities of gait and mobility: Secondary | ICD-10-CM | POA: Diagnosis not present

## 2023-06-02 ENCOUNTER — Telehealth: Payer: Self-pay | Admitting: Internal Medicine

## 2023-06-02 NOTE — Telephone Encounter (Signed)
ERROR

## 2023-06-08 DIAGNOSIS — M1611 Unilateral primary osteoarthritis, right hip: Secondary | ICD-10-CM | POA: Diagnosis not present

## 2023-06-08 DIAGNOSIS — R2689 Other abnormalities of gait and mobility: Secondary | ICD-10-CM | POA: Diagnosis not present

## 2023-06-08 DIAGNOSIS — M25551 Pain in right hip: Secondary | ICD-10-CM | POA: Diagnosis not present

## 2023-06-08 DIAGNOSIS — M545 Low back pain, unspecified: Secondary | ICD-10-CM | POA: Diagnosis not present

## 2023-06-10 DIAGNOSIS — M545 Low back pain, unspecified: Secondary | ICD-10-CM | POA: Diagnosis not present

## 2023-06-10 DIAGNOSIS — M1611 Unilateral primary osteoarthritis, right hip: Secondary | ICD-10-CM | POA: Diagnosis not present

## 2023-06-10 DIAGNOSIS — M25551 Pain in right hip: Secondary | ICD-10-CM | POA: Diagnosis not present

## 2023-06-10 DIAGNOSIS — R2689 Other abnormalities of gait and mobility: Secondary | ICD-10-CM | POA: Diagnosis not present

## 2023-06-11 DIAGNOSIS — J449 Chronic obstructive pulmonary disease, unspecified: Secondary | ICD-10-CM | POA: Diagnosis not present

## 2023-06-19 DIAGNOSIS — M1611 Unilateral primary osteoarthritis, right hip: Secondary | ICD-10-CM | POA: Insufficient documentation

## 2023-06-19 HISTORY — DX: Unilateral primary osteoarthritis, right hip: M16.11

## 2023-06-24 DIAGNOSIS — J449 Chronic obstructive pulmonary disease, unspecified: Secondary | ICD-10-CM | POA: Diagnosis not present

## 2023-06-24 DIAGNOSIS — I5030 Unspecified diastolic (congestive) heart failure: Secondary | ICD-10-CM | POA: Diagnosis not present

## 2023-06-25 DIAGNOSIS — G8929 Other chronic pain: Secondary | ICD-10-CM | POA: Diagnosis not present

## 2023-06-25 DIAGNOSIS — Z043 Encounter for examination and observation following other accident: Secondary | ICD-10-CM | POA: Diagnosis not present

## 2023-06-25 DIAGNOSIS — M47816 Spondylosis without myelopathy or radiculopathy, lumbar region: Secondary | ICD-10-CM | POA: Diagnosis not present

## 2023-06-25 DIAGNOSIS — W19XXXA Unspecified fall, initial encounter: Secondary | ICD-10-CM | POA: Diagnosis not present

## 2023-06-25 DIAGNOSIS — M25572 Pain in left ankle and joints of left foot: Secondary | ICD-10-CM | POA: Diagnosis not present

## 2023-06-25 DIAGNOSIS — M160 Bilateral primary osteoarthritis of hip: Secondary | ICD-10-CM | POA: Diagnosis not present

## 2023-06-25 DIAGNOSIS — M25551 Pain in right hip: Secondary | ICD-10-CM | POA: Diagnosis not present

## 2023-06-25 DIAGNOSIS — F17210 Nicotine dependence, cigarettes, uncomplicated: Secondary | ICD-10-CM | POA: Diagnosis not present

## 2023-06-25 DIAGNOSIS — Z743 Need for continuous supervision: Secondary | ICD-10-CM | POA: Diagnosis not present

## 2023-06-26 DIAGNOSIS — J449 Chronic obstructive pulmonary disease, unspecified: Secondary | ICD-10-CM | POA: Diagnosis not present

## 2023-06-26 DIAGNOSIS — I5030 Unspecified diastolic (congestive) heart failure: Secondary | ICD-10-CM | POA: Diagnosis not present

## 2023-06-28 DIAGNOSIS — H524 Presbyopia: Secondary | ICD-10-CM | POA: Diagnosis not present

## 2023-06-28 DIAGNOSIS — H26492 Other secondary cataract, left eye: Secondary | ICD-10-CM | POA: Diagnosis not present

## 2023-06-28 DIAGNOSIS — Z961 Presence of intraocular lens: Secondary | ICD-10-CM | POA: Diagnosis not present

## 2023-06-28 DIAGNOSIS — H25011 Cortical age-related cataract, right eye: Secondary | ICD-10-CM | POA: Diagnosis not present

## 2023-06-28 DIAGNOSIS — H25041 Posterior subcapsular polar age-related cataract, right eye: Secondary | ICD-10-CM | POA: Diagnosis not present

## 2023-06-28 DIAGNOSIS — E119 Type 2 diabetes mellitus without complications: Secondary | ICD-10-CM

## 2023-06-28 DIAGNOSIS — H43812 Vitreous degeneration, left eye: Secondary | ICD-10-CM | POA: Diagnosis not present

## 2023-06-28 DIAGNOSIS — H5203 Hypermetropia, bilateral: Secondary | ICD-10-CM | POA: Diagnosis not present

## 2023-06-28 DIAGNOSIS — H52203 Unspecified astigmatism, bilateral: Secondary | ICD-10-CM | POA: Diagnosis not present

## 2023-06-28 DIAGNOSIS — H2511 Age-related nuclear cataract, right eye: Secondary | ICD-10-CM | POA: Diagnosis not present

## 2023-06-28 HISTORY — DX: Hypermetropia, bilateral: H52.03

## 2023-06-28 HISTORY — DX: Type 2 diabetes mellitus without complications: E11.9

## 2023-06-28 HISTORY — DX: Vitreous degeneration, left eye: H43.812

## 2023-07-08 IMAGING — CR DG CHEST 2V
2 series · 2 of 2 positions shown · non-contrast
Comparison: 04/14/2021.  CT 06/23/2021

CLINICAL DATA: Shortness of breath

EXAM:
CHEST - 2 VIEW

[chest pa]
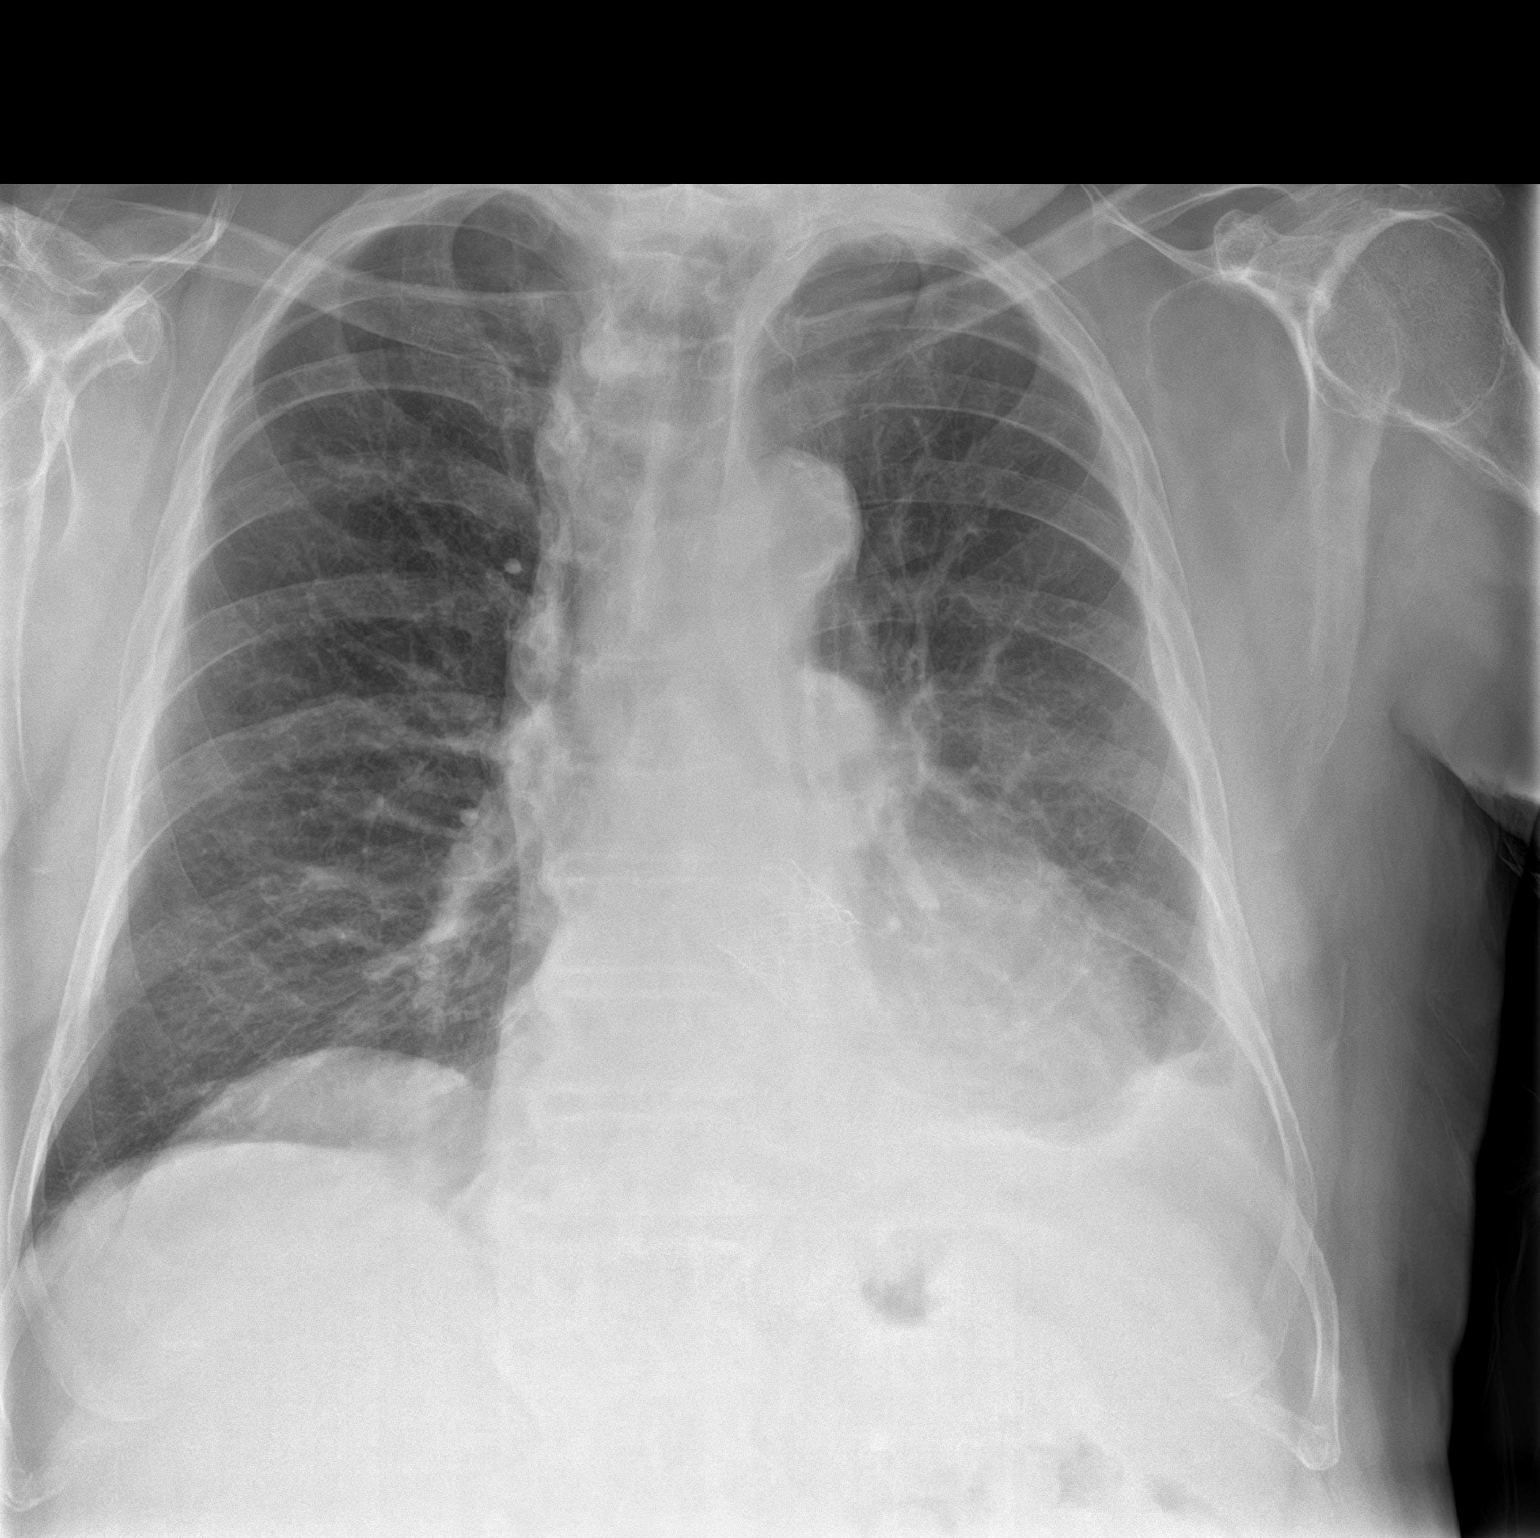

[chest lat]
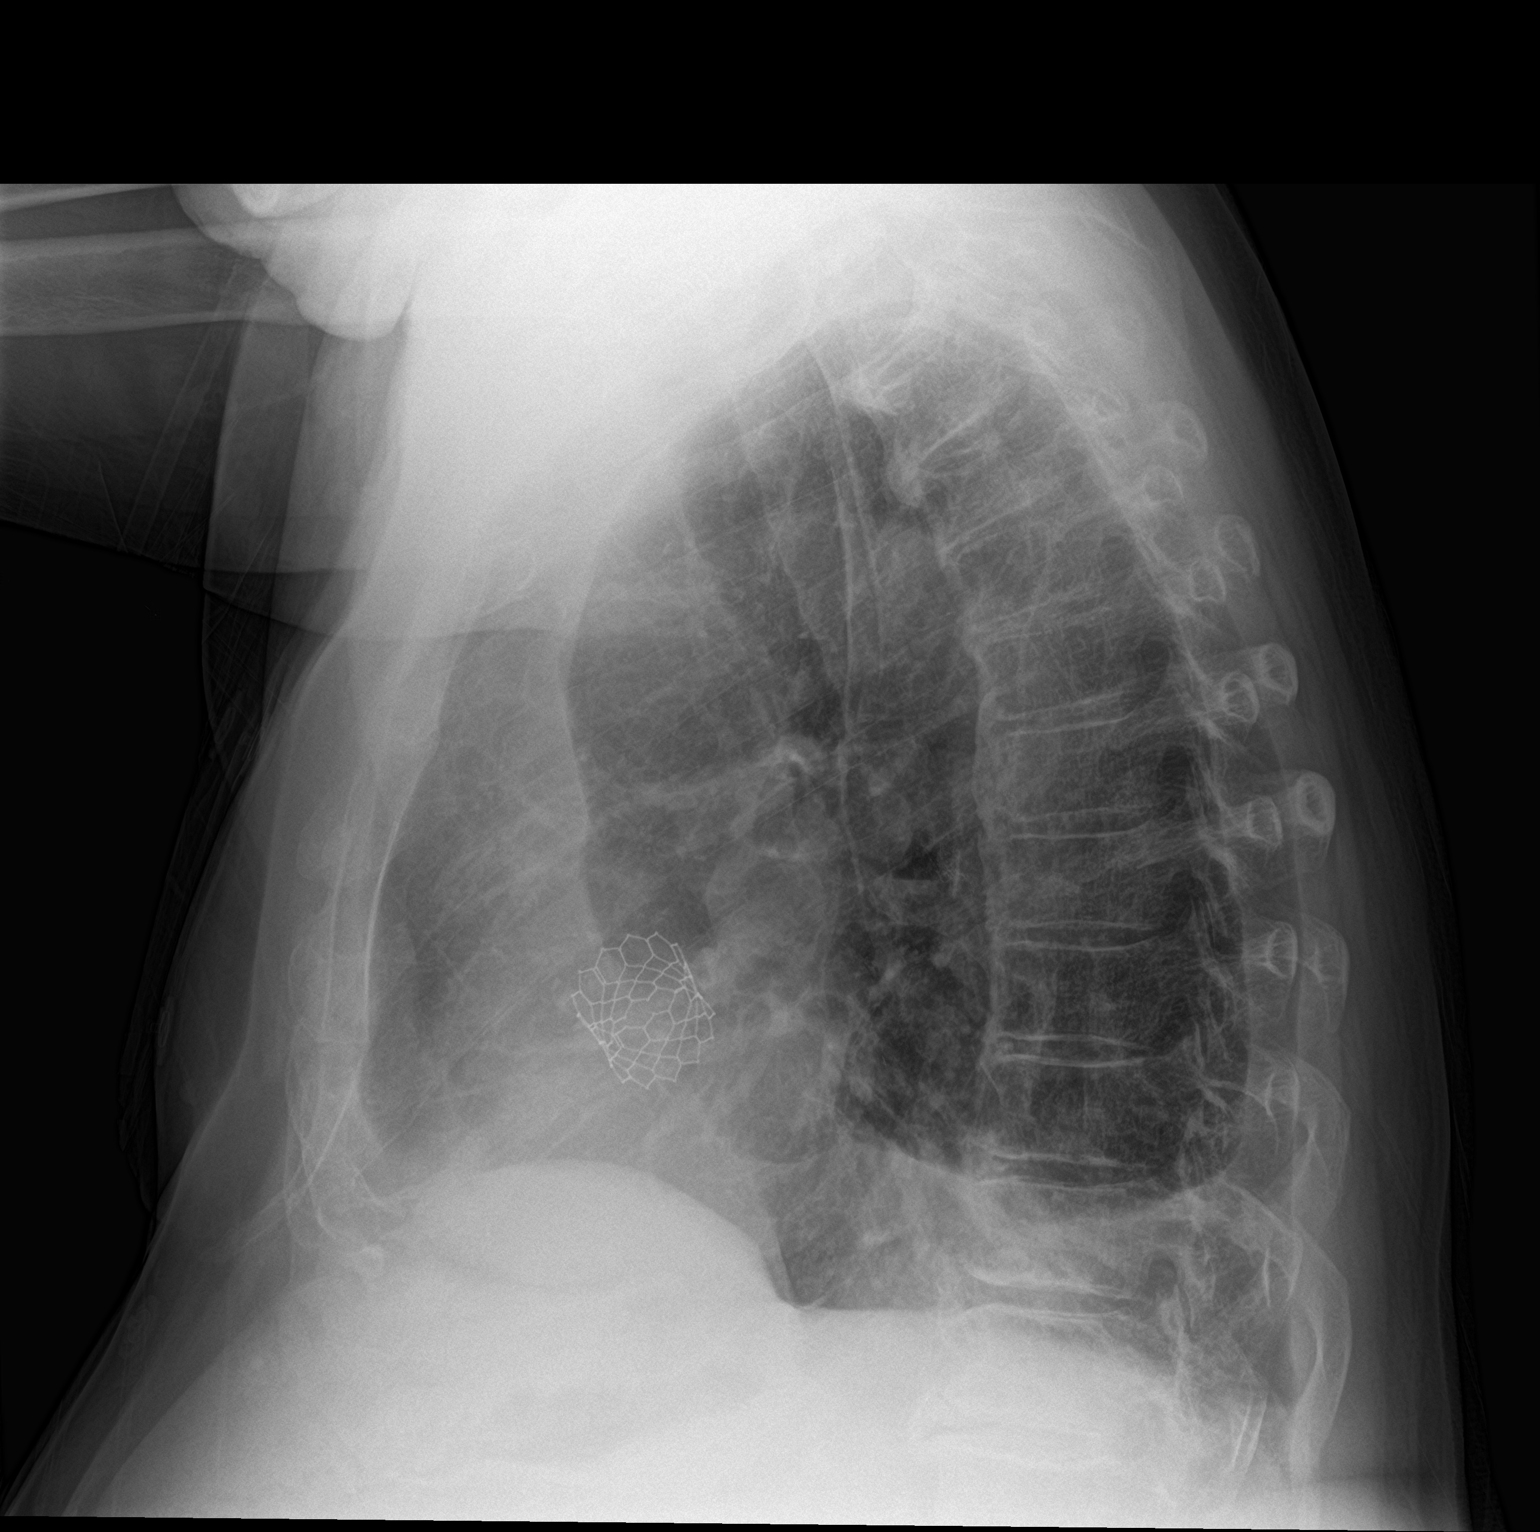

[2 of 2 positions shown; findings below may reference images not displayed]

FINDINGS: Small left pleural effusion. Prior aortic valve repair. Heart is
borderline in size. No confluent airspace opacities. No
pneumothorax. No acute bony abnormality.
IMPRESSION: Small left pleural effusion, stable since prior CT.

## 2023-07-11 DIAGNOSIS — J449 Chronic obstructive pulmonary disease, unspecified: Secondary | ICD-10-CM | POA: Diagnosis not present

## 2023-07-15 DIAGNOSIS — E782 Mixed hyperlipidemia: Secondary | ICD-10-CM | POA: Diagnosis not present

## 2023-07-15 DIAGNOSIS — G720 Drug-induced myopathy: Secondary | ICD-10-CM | POA: Diagnosis not present

## 2023-07-15 DIAGNOSIS — E569 Vitamin deficiency, unspecified: Secondary | ICD-10-CM | POA: Diagnosis not present

## 2023-07-15 DIAGNOSIS — J449 Chronic obstructive pulmonary disease, unspecified: Secondary | ICD-10-CM | POA: Diagnosis not present

## 2023-07-15 DIAGNOSIS — E119 Type 2 diabetes mellitus without complications: Secondary | ICD-10-CM | POA: Diagnosis not present

## 2023-07-15 DIAGNOSIS — I10 Essential (primary) hypertension: Secondary | ICD-10-CM | POA: Diagnosis not present

## 2023-07-15 DIAGNOSIS — E1151 Type 2 diabetes mellitus with diabetic peripheral angiopathy without gangrene: Secondary | ICD-10-CM | POA: Diagnosis not present

## 2023-07-15 DIAGNOSIS — Z122 Encounter for screening for malignant neoplasm of respiratory organs: Secondary | ICD-10-CM | POA: Diagnosis not present

## 2023-07-15 DIAGNOSIS — E1169 Type 2 diabetes mellitus with other specified complication: Secondary | ICD-10-CM | POA: Diagnosis not present

## 2023-07-15 DIAGNOSIS — M129 Arthropathy, unspecified: Secondary | ICD-10-CM | POA: Diagnosis not present

## 2023-07-15 DIAGNOSIS — M1711 Unilateral primary osteoarthritis, right knee: Secondary | ICD-10-CM | POA: Diagnosis not present

## 2023-07-15 DIAGNOSIS — Z0000 Encounter for general adult medical examination without abnormal findings: Secondary | ICD-10-CM | POA: Diagnosis not present

## 2023-07-15 DIAGNOSIS — F17210 Nicotine dependence, cigarettes, uncomplicated: Secondary | ICD-10-CM | POA: Diagnosis not present

## 2023-07-24 DIAGNOSIS — I5030 Unspecified diastolic (congestive) heart failure: Secondary | ICD-10-CM | POA: Diagnosis not present

## 2023-07-24 DIAGNOSIS — J449 Chronic obstructive pulmonary disease, unspecified: Secondary | ICD-10-CM | POA: Diagnosis not present

## 2023-07-26 DIAGNOSIS — I5030 Unspecified diastolic (congestive) heart failure: Secondary | ICD-10-CM | POA: Diagnosis not present

## 2023-07-26 DIAGNOSIS — J449 Chronic obstructive pulmonary disease, unspecified: Secondary | ICD-10-CM | POA: Diagnosis not present

## 2023-08-03 DIAGNOSIS — H25811 Combined forms of age-related cataract, right eye: Secondary | ICD-10-CM | POA: Diagnosis not present

## 2023-08-08 DIAGNOSIS — I5032 Chronic diastolic (congestive) heart failure: Secondary | ICD-10-CM | POA: Diagnosis not present

## 2023-08-08 DIAGNOSIS — E1151 Type 2 diabetes mellitus with diabetic peripheral angiopathy without gangrene: Secondary | ICD-10-CM | POA: Diagnosis not present

## 2023-08-08 DIAGNOSIS — Z136 Encounter for screening for cardiovascular disorders: Secondary | ICD-10-CM | POA: Diagnosis not present

## 2023-08-11 DIAGNOSIS — J449 Chronic obstructive pulmonary disease, unspecified: Secondary | ICD-10-CM | POA: Diagnosis not present

## 2023-08-15 DIAGNOSIS — M65871 Other synovitis and tenosynovitis, right ankle and foot: Secondary | ICD-10-CM | POA: Diagnosis not present

## 2023-08-15 DIAGNOSIS — L84 Corns and callosities: Secondary | ICD-10-CM | POA: Diagnosis not present

## 2023-08-15 DIAGNOSIS — E0840 Diabetes mellitus due to underlying condition with diabetic neuropathy, unspecified: Secondary | ICD-10-CM | POA: Diagnosis not present

## 2023-08-15 DIAGNOSIS — M79661 Pain in right lower leg: Secondary | ICD-10-CM | POA: Diagnosis not present

## 2023-08-15 DIAGNOSIS — B351 Tinea unguium: Secondary | ICD-10-CM | POA: Diagnosis not present

## 2023-08-15 DIAGNOSIS — M79675 Pain in left toe(s): Secondary | ICD-10-CM | POA: Diagnosis not present

## 2023-08-17 DIAGNOSIS — H2511 Age-related nuclear cataract, right eye: Secondary | ICD-10-CM | POA: Diagnosis not present

## 2023-08-17 DIAGNOSIS — H25811 Combined forms of age-related cataract, right eye: Secondary | ICD-10-CM | POA: Diagnosis not present

## 2023-08-24 DIAGNOSIS — I5030 Unspecified diastolic (congestive) heart failure: Secondary | ICD-10-CM | POA: Diagnosis not present

## 2023-08-24 DIAGNOSIS — J449 Chronic obstructive pulmonary disease, unspecified: Secondary | ICD-10-CM | POA: Diagnosis not present

## 2023-08-26 DIAGNOSIS — J449 Chronic obstructive pulmonary disease, unspecified: Secondary | ICD-10-CM | POA: Diagnosis not present

## 2023-08-26 DIAGNOSIS — I5030 Unspecified diastolic (congestive) heart failure: Secondary | ICD-10-CM | POA: Diagnosis not present

## 2023-09-10 DIAGNOSIS — J449 Chronic obstructive pulmonary disease, unspecified: Secondary | ICD-10-CM | POA: Diagnosis not present

## 2023-09-21 IMAGING — CT CT CHEST W/O CM
2 of 4 series · 15 of 36 positions shown, 18 images · non-contrast
Comparison: Multiple exams, including 06/23/2021

CLINICAL DATA: Non-small cell lung cancer. History of pancreatic
IPMN and Whipple procedure. Patient states history prostate cancer.

* Tracking Code: BO *
EXAM:
CT CHEST WITHOUT CONTRAST
TECHNIQUE: Multidetector CT imaging of the chest was performed following the
standard protocol without IV contrast.
RADIATION DOSE REDUCTION: This exam was performed according to the
departmental dose-optimization program which includes automated
exposure control, adjustment of the mA and/or kV according to
patient size and/or use of iterative reconstruction technique.

[Series 2: thorax · axial · 0.77mm/px · z∈[-10,+266]mm · 12 of 164 slices shown, 15 images]
[im 13/164  mediastinal]
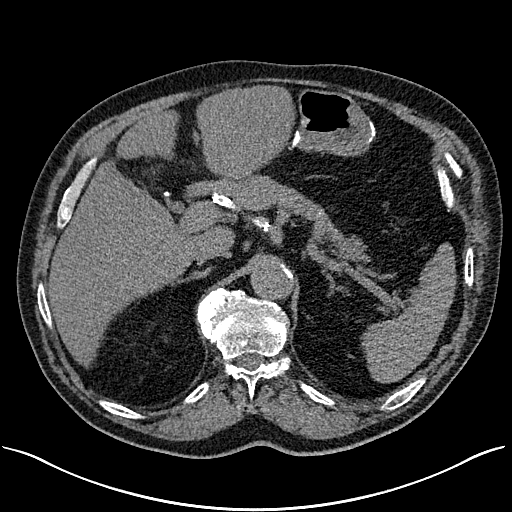
[im 13/164  lung]
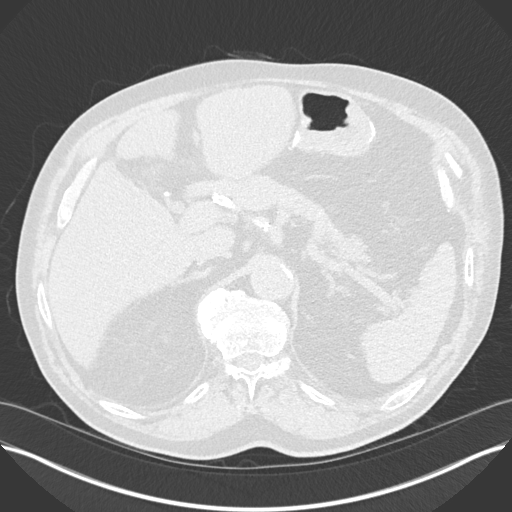
[im 26/164  lung]
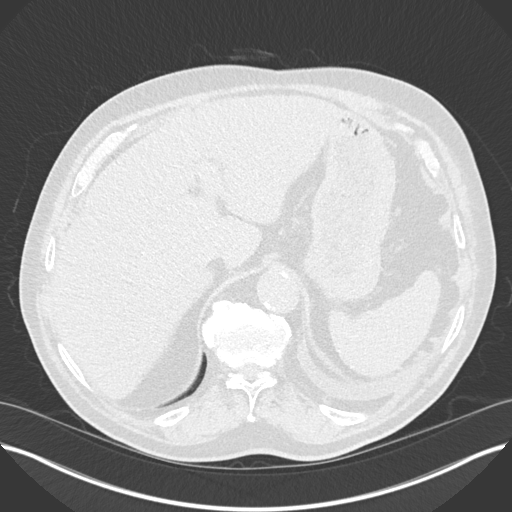
[im 38/164  lung]
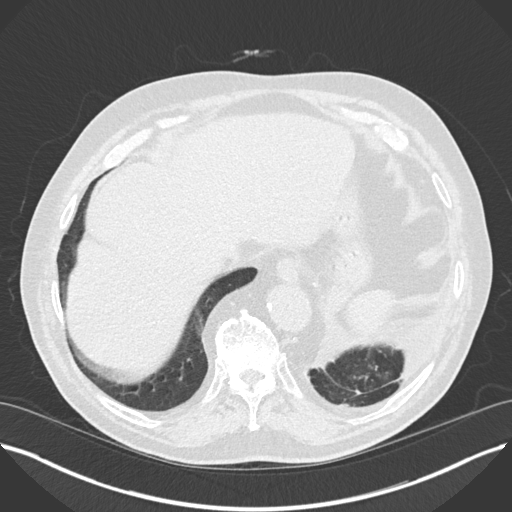
[im 51/164  lung]
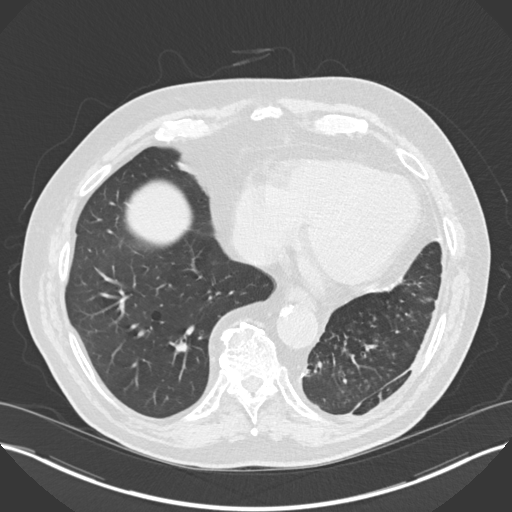
[im 63/164  mediastinal]
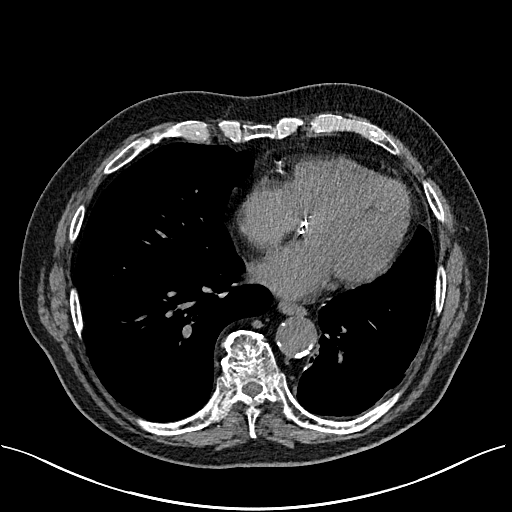
[im 63/164  lung]
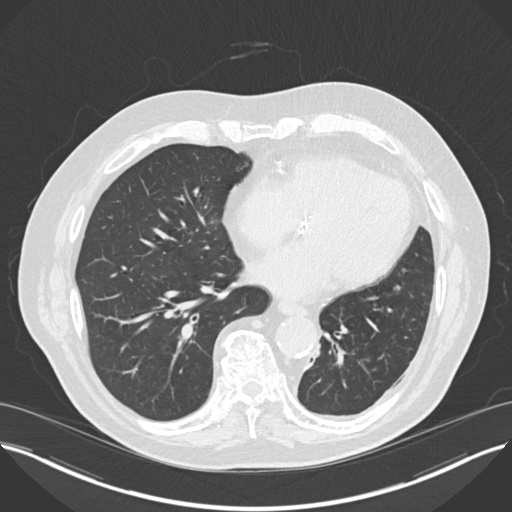
[im 76/164  lung]
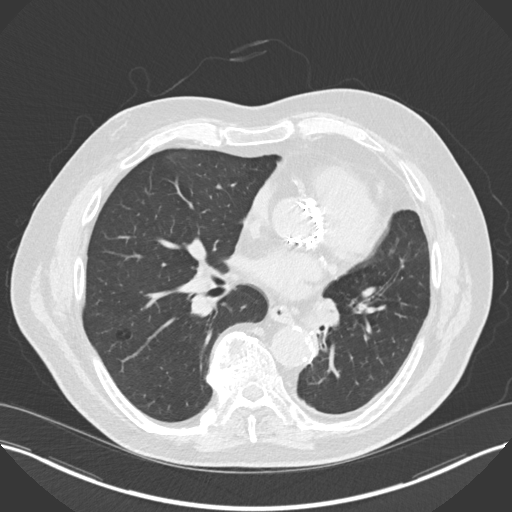
[im 88/164  lung]
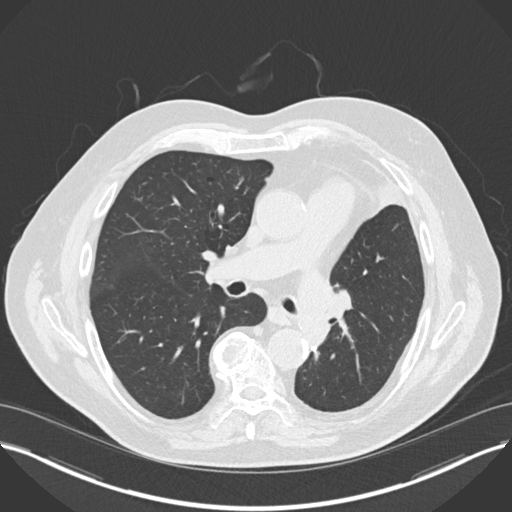
[im 101/164  lung]
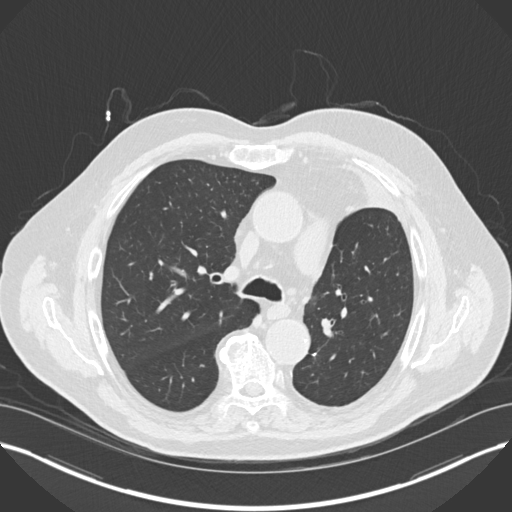
[im 113/164  mediastinal]
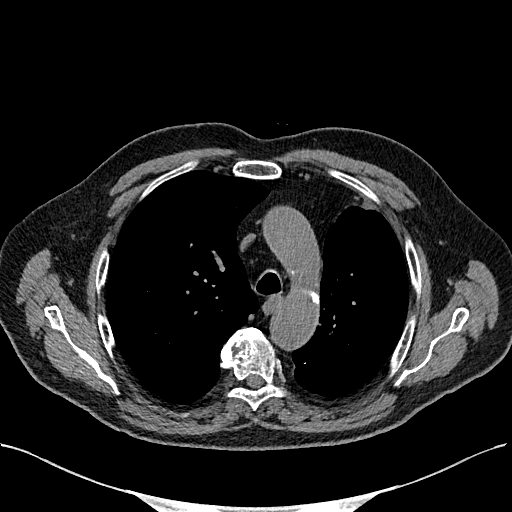
[im 113/164  lung]
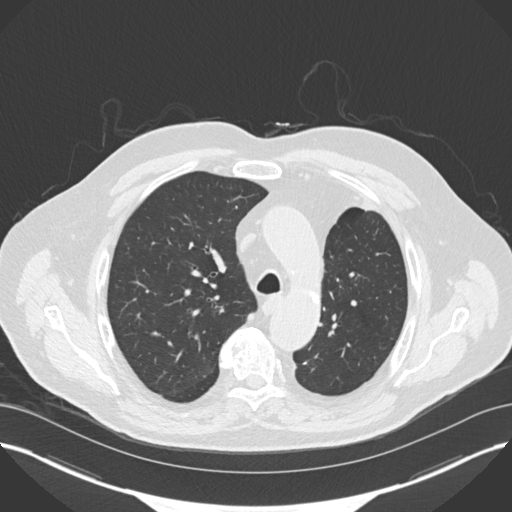
[im 126/164  lung]
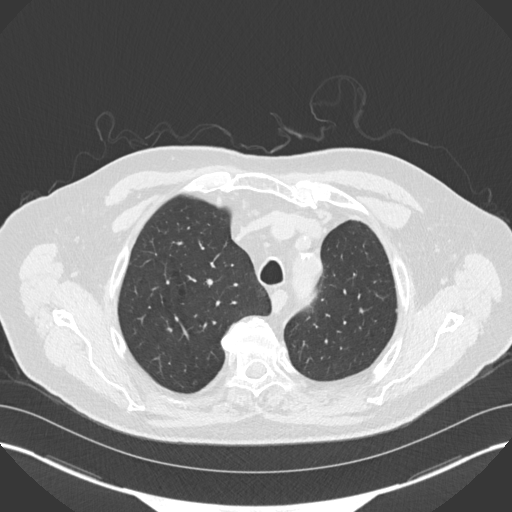
[im 138/164  lung]
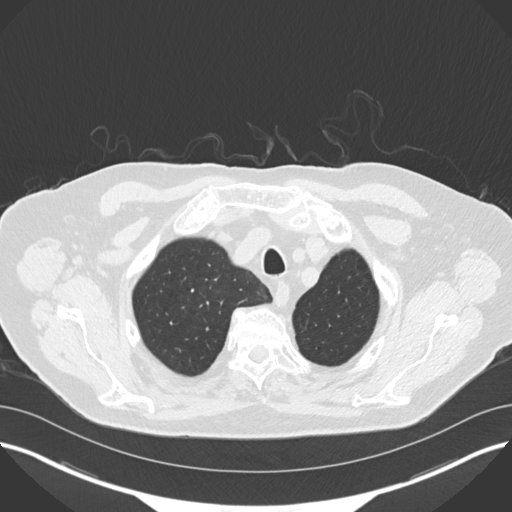
[im 151/164  lung]
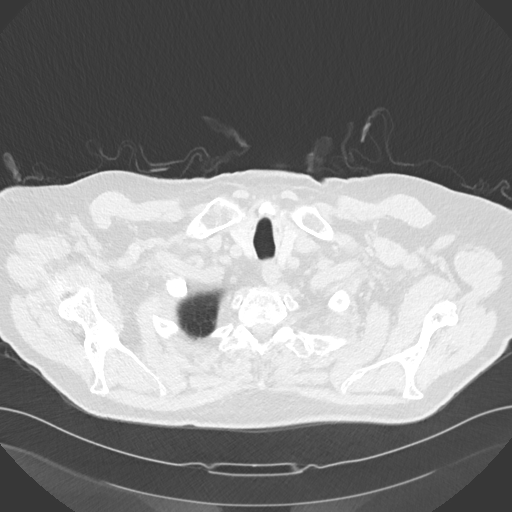

[Series 5: coronal · coronal · 0.72mm/px · 3 of 161 slices shown]
[im 33/161  lung]
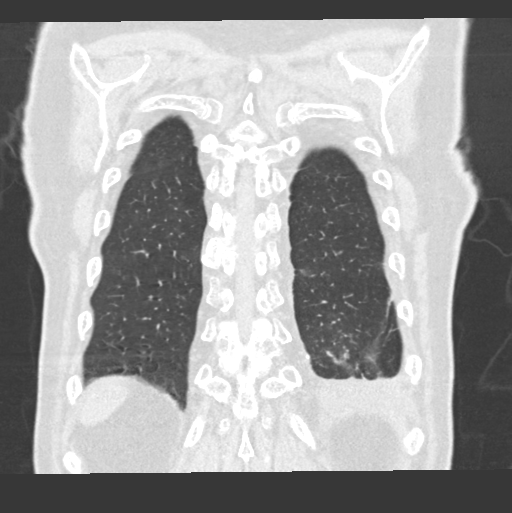
[im 65/161  lung]
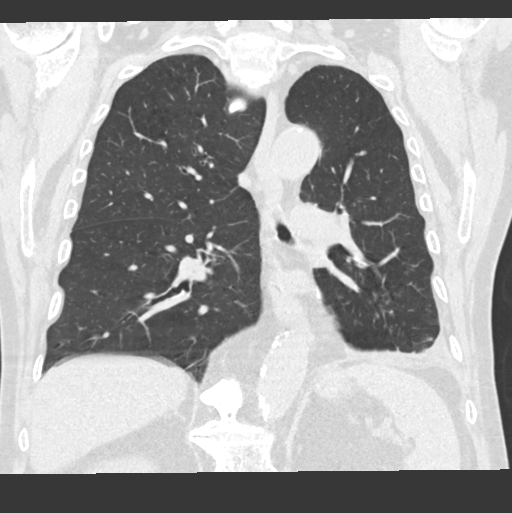
[im 97/161  lung]
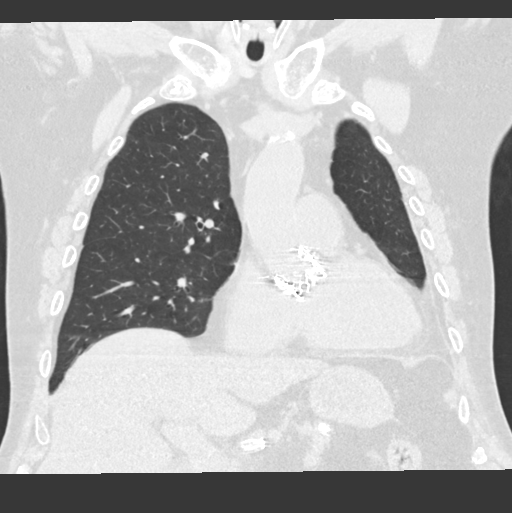

[15 of 36 positions shown; findings below may reference images not displayed]

FINDINGS: Cardiovascular: Coronary, aortic arch, and branch vessel
atherosclerotic vascular disease. Prior TAVR.

Mediastinum/Nodes: No pathologic adenopathy identified.

Lungs/Pleura: Centrilobular emphysema. 6 by 3 mm right upper lobe
nodule along the major fissure on image 81 series 3, no change from
03/11/2020, likely a benign perifissural lymph node.

There foci of airway plugging in the right lower lobe, for example
on image 105 series 3. Bilateral airway thickening noted.

Left lower lobectomy. Scattered reticulonodular opacities inferiorly
in the left upper lobe favoring atypical infectious bronchiolitis.
Trace left pleural effusion, reduced from prior.

Upper Abdomen: Prior Whipple. Biliary stent. There is a dense
chronic calcification along the junction of pancreatic body and
tail.

Musculoskeletal: Thoracic spondylosis with multilevel bridging
osteophytes.
IMPRESSION: 1. No findings of recurrent malignancy.
2. Reduced left pleural effusion.
3. Scattered reticulonodular opacities inferiorly in the left upper
lobe favoring atypical infectious bronchiolitis. Prior left lower
lobectomy.
4. Prior Whipple procedure.
5. Aortic Atherosclerosis (MT9RL-[id number].U). Coronary atherosclerosis.
Prior TAVR.
6.  Emphysema (MT9RL-YDY.6).

## 2023-09-23 DIAGNOSIS — J449 Chronic obstructive pulmonary disease, unspecified: Secondary | ICD-10-CM | POA: Diagnosis not present

## 2023-09-23 DIAGNOSIS — I5030 Unspecified diastolic (congestive) heart failure: Secondary | ICD-10-CM | POA: Diagnosis not present

## 2023-09-25 DIAGNOSIS — M47816 Spondylosis without myelopathy or radiculopathy, lumbar region: Secondary | ICD-10-CM | POA: Diagnosis not present

## 2023-09-25 DIAGNOSIS — J449 Chronic obstructive pulmonary disease, unspecified: Secondary | ICD-10-CM | POA: Diagnosis not present

## 2023-09-25 DIAGNOSIS — I6782 Cerebral ischemia: Secondary | ICD-10-CM | POA: Diagnosis not present

## 2023-09-25 DIAGNOSIS — R296 Repeated falls: Secondary | ICD-10-CM | POA: Diagnosis not present

## 2023-09-25 DIAGNOSIS — M1611 Unilateral primary osteoarthritis, right hip: Secondary | ICD-10-CM | POA: Diagnosis not present

## 2023-09-25 DIAGNOSIS — Z743 Need for continuous supervision: Secondary | ICD-10-CM | POA: Diagnosis not present

## 2023-09-25 DIAGNOSIS — R6889 Other general symptoms and signs: Secondary | ICD-10-CM | POA: Diagnosis not present

## 2023-09-25 DIAGNOSIS — M25572 Pain in left ankle and joints of left foot: Secondary | ICD-10-CM | POA: Diagnosis not present

## 2023-09-25 DIAGNOSIS — M160 Bilateral primary osteoarthritis of hip: Secondary | ICD-10-CM | POA: Diagnosis not present

## 2023-09-25 DIAGNOSIS — I5030 Unspecified diastolic (congestive) heart failure: Secondary | ICD-10-CM | POA: Diagnosis not present

## 2023-09-25 DIAGNOSIS — R509 Fever, unspecified: Secondary | ICD-10-CM | POA: Diagnosis not present

## 2023-09-25 DIAGNOSIS — M25551 Pain in right hip: Secondary | ICD-10-CM | POA: Diagnosis not present

## 2023-10-07 DIAGNOSIS — M1611 Unilateral primary osteoarthritis, right hip: Secondary | ICD-10-CM | POA: Diagnosis not present

## 2023-10-11 DIAGNOSIS — J449 Chronic obstructive pulmonary disease, unspecified: Secondary | ICD-10-CM | POA: Diagnosis not present

## 2023-10-15 NOTE — Progress Notes (Deleted)
 Date:  10/15/2023   ID:  Nathaniel Little, DOB [birth year], MRN [id number]  Patient Location:  [address] [place] [address]   Provider location:   [facility], [place] office  PCP:  Nathaniel Little Nathaniel Little., MD  Cardiologist:  [facility]  No chief complaint on file.   History of Present Illness:    Nathaniel Little is a 78 y.o. male past medical history of Smoker, COPD Prior significant alcohol intake.  OSA, on CPAP HTN Murmur Prostate cancer Diabetes: 07/25/2017 emergency department via EMS with 1 day of progressive worsening shortness of breath.   increasing productive cough and shortness of breath  Constipated CT ABD: Pancreatic findings :chronic pancreatitis and calcifications, possible pancreatic mass Moderate aortic atherosclerosis noted extending into the common iliac vessels Who presents for follow up of his aortic valve stenosis, TAVR April 01, 2020   Last seen by myself on telemetry visit April 2023   Seen by one of our providers October 2022  Echo May 2021  Severe aortic valve stenosis with normal ejection fraction Cardiac catheterization May 2021 showing nonobstructive disease Mild carotid disease  CTA performed showing suspicion for bronchogenic carcinoma PET scan June 2021 concerning for primary bronchogenic neoplasm  Underwent TAVR April 01, 2020  L lower lobectomy on 11/17/20.  Quit smoking  Having right foot pain,lesion on bottom of foot  Incontinence , no control, going on for 3 to 4 months, peeing on myself  Presents today in wheelchair Difficulty walking, has a sore on the bottom of his right foot  CT chest, COPD  On oxygen  at night  Has thick cough Has 2 inhaler one in house, one in the car Denies chest pain concerning for angina  EKG personally reviewed by myself on todays visit Normal sinus rhythm rate 77 bpm consider old anterior MI, old inferior MI   Past Medical History:  Diagnosis Date   (HFpEF)  heart failure with preserved ejection fraction (HCC)    a. 08/2017 Echo: EF 60-65%, Gr1 DD.   Arthritis    COPD (chronic obstructive pulmonary disease) (HCC)    Coronary artery disease 2021   Depression    Diabetes mellitus without complication (HCC)    Hypertension    Incidental pulmonary nodule, greater than or equal to 8mm 03/11/2020   Left lower lobe - suspicious for bronchogenic neoplasm   Lymphedema    Morbid obesity (HCC)    Prostate cancer (HCC) 03/2013   ? PANCREATIC   S/P TAVR (transcatheter aortic valve replacement) 04/01/2020   s/p TAVR with a 29 mm Edwards Sapien 3 THV via the TF approach by Drs Nathaniel Little & Nathaniel Little   Severe aortic stenosis    Sleep apnea with use of continuous positive airway pressure (CPAP)    Tobacco abuse    Tremor    HEAD   Past Surgical History:  Procedure Laterality Date   ADENOIDECTOMY     APPENDECTOMY     CARDIAC CATHETERIZATION     CATARACT EXTRACTION W/PHACO Left 11/28/2018   Procedure: CATARACT EXTRACTION PHACO AND INTRAOCULAR LENS PLACEMENT (IOC) LEFT, DIABETIC;  Surgeon: Nathaniel Fallow, MD;  Location: [facility] ORS;  Service: Ophthalmology;  Laterality: Left;  US   00:56 CDE 9.44 Fluid pack lot # [id number]   EUS N/A 10/13/2017   Procedure: FULL UPPER ENDOSCOPIC ULTRASOUND (EUS) RADIAL;  Surgeon: Nathaniel Little LABOR, MD;  Location: [facility] ENDOSCOPY;  Service: Gastroenterology;  Laterality: N/A;   FLEXIBLE BRONCHOSCOPY N/A 11/19/2020  Procedure: FLEXIBLE BRONCHOSCOPY;  Surgeon: Nathaniel Elspeth BROCKS, MD;  Location: [facility] OR;  Service: Thoracic;  Laterality: N/A;   INTERCOSTAL NERVE BLOCK Left 11/17/2020   Procedure: INTERCOSTAL NERVE BLOCK;  Surgeon: Nathaniel Elspeth BROCKS, MD;  Location: [facility] OR;  Service: Thoracic;  Laterality: Left;   NODE DISSECTION Left 11/17/2020   Procedure: NODE DISSECTION;  Surgeon: Nathaniel Elspeth BROCKS, MD;  Location: [facility] OR;  Service: Thoracic;  Laterality: Left;   PANCREATICODUODENECTOMY  07/23/2018   RIGHT/LEFT HEART CATH AND  CORONARY ANGIOGRAPHY N/A 02/21/2020   Procedure: RIGHT/LEFT HEART CATH AND CORONARY ANGIOGRAPHY;  Surgeon: Perla Evalene PARAS, MD;  Location: [facility] INVASIVE CV LAB;  Service: Cardiovascular;  Laterality: N/A;   TEE WITHOUT CARDIOVERSION N/A 04/01/2020   Procedure: TRANSESOPHAGEAL ECHOCARDIOGRAM (TEE);  Surgeon: Nathaniel Lonni BIRCH, MD;  Location: [facility] INVASIVE CV LAB;  Service: Open Heart Surgery;  Laterality: N/A;   TONSILLECTOMY     TRANSCATHETER AORTIC VALVE REPLACEMENT, TRANSFEMORAL N/A 04/01/2020   Procedure: TRANSCATHETER AORTIC VALVE REPLACEMENT, TRANSFEMORAL;  Surgeon: Nathaniel Lonni BIRCH, MD;  Location: [facility] INVASIVE CV LAB;  Service: Open Heart Surgery;  Laterality: N/A;   wipple       No outpatient medications have been marked as taking for the 10/17/23 encounter (Appointment) with Nathaniel Koble J, MD.     Allergies:   Codeine   Social History   Tobacco Use   Smoking status: Former    Current packs/day: 0.00    Average packs/day: 4.0 packs/day for 68.0 years (272.0 ttl pk-yrs)    Types: Cigarettes    Start date: 10/04/1953    Quit date: 10/04/2021    Years since quitting: 2.0   Smokeless tobacco: Former    Types: Chew, Snuff    Quit date: 2000   Tobacco comments:    1 cigs a day   Vaping Use   Vaping status: Never Used  Substance Use Topics   Alcohol use: Not Currently    Alcohol/week: 0.0 - 1.0 standard drinks of alcohol    Comment: quit 40years ago   Drug use: No     Current Outpatient Medications on File Prior to Visit  Medication Sig Dispense Refill   albuterol  (VENTOLIN  HFA) 108 (90 Base) MCG/ACT inhaler Inhale 2 puffs into the lungs every 6 (six) hours as needed for wheezing or shortness of breath. 54 g 1   aspirin  EC 81 MG tablet Take 81 mg by mouth daily.     atorvastatin  (LIPITOR ) 10 MG tablet Take 1 tablet (10 mg total) by mouth daily. 30 tablet 2   BREZTRI  AEROSPHERE 160-9-4.8 MCG/ACT AERO Inhale 2 puffs into the lungs 2 (two) times daily. 10.7 g 2    calcium -vitamin D (OSCAL WITH D) 250-125 MG-UNIT tablet Take 1 tablet by mouth daily.     carvedilol  (COREG ) 6.25 MG tablet Take 1 tablet (6.25 mg total) by mouth 2 (two) times daily with a meal. 180 tablet 3   cloNIDine  (CATAPRES ) 0.1 MG tablet Take 1 tablet (0.1 mg total) by mouth 2 (two) times daily. 180 tablet 3   ezetimibe  (ZETIA ) 10 MG tablet Take 1 tablet (10 mg total) by mouth daily. 90 tablet 3   fluticasone  (FLONASE ) 50 MCG/ACT nasal spray      gabapentin  (NEURONTIN ) 100 MG capsule TAKE 1-2 CAPSULES BY MOUTH AT BEDTIME 180 capsule 0   glipiZIDE  (GLUCOTROL ) 5 MG tablet TAKE 1 TABLET BY MOUTH TWICE DAILY BEFORE A MEAL 180 tablet 0   glucose blood (ACCU-CHEK GUIDE) test strip Check blood  sugar twice daily 100 each 12   ipratropium-albuterol  (DUONEB) 0.5-2.5 (3) MG/3ML SOLN Take 3 mLs by nebulization every 6 (six) hours as needed (Asthma). 360 mL 12   lisinopril  (ZESTRIL ) 5 MG tablet Take 1 tablet (5 mg total) by mouth daily. 90 tablet 3   metFORMIN  (GLUCOPHAGE ) 500 MG tablet Take 1,000 mg by mouth in the morning and at bedtime.     pantoprazole  (PROTONIX ) 40 MG tablet TAKE 1 TABLET BY MOUTH TWICE DAILY 60 tablet 3   potassium chloride  (KLOR-CON ) 10 MEQ tablet Take 10 mEq by mouth daily.     sennosides-docusate sodium  (SENOKOT-S) 8.6-50 MG tablet Take 1 tablet by mouth in the morning and at bedtime. 180 tablet 0   torsemide  (DEMADEX ) 10 MG tablet Take 1 tablet (10 mg total) by mouth daily. 30 tablet 6   No current facility-administered medications on file prior to visit.     Family Hx: The patient's family history includes Breast cancer in his mother; Cancer in his father; Cerebral palsy in his brother; Tuberculosis in his paternal uncle. There is no history of Prostate cancer, Kidney cancer, or Bladder Cancer.  ROS:   Please see the history of present illness.    Review of Systems  Constitutional: Negative.   Respiratory:  Positive for shortness of breath.   Cardiovascular:  Negative.   Gastrointestinal: Negative.   Musculoskeletal: Negative.   Neurological: Negative.   Psychiatric/Behavioral: Negative.    All other systems reviewed and are negative.     Labs/Other Tests and Data Reviewed:    Recent Labs: No results found for requested labs within last 365 days.   Recent Lipid Panel Lab Results  Component Value Date/Time   CHOL 157 06/29/2022 09:34 AM   CHOL 149 02/08/2020 12:28 PM   TRIG 118 06/29/2022 09:34 AM   HDL 53 06/29/2022 09:34 AM   HDL 38 (L) 02/08/2020 12:28 PM   CHOLHDL 3.0 06/29/2022 09:34 AM   LDLCALC 83 06/29/2022 09:34 AM    Wt Readings from Last 3 Encounters:  06/29/22 190 lb (86.2 kg)  04/26/22 196 lb (88.9 kg)  04/08/22 189 lb (85.7 kg)     Exam:    Vital Signs: Vital signs may also be detailed in the HPI There were no vitals taken for this visit.  Constitutional:  oriented to person, place, and time. No distress.  HENT:  Head: Grossly normal Eyes:  no discharge. No scleral icterus.  Neck: No JVD, no carotid bruits  Cardiovascular: Regular rate and rhythm, no murmurs appreciated Pulmonary/Chest: Moderately decreased breath sounds throughout, scattered Rales Abdominal: Soft.  no distension.  no tenderness.  Musculoskeletal: Normal range of motion Neurological:  normal muscle tone. Coordination normal. No atrophy Skin: Skin warm and dry, sore on the bottom of his right foot size of a quarter Psychiatric: normal affect, pleasant  ASSESSMENT & PLAN:    Aortic atherosclerosis (HCC) Diffuse disease, smoking cessation recommended Continue statin, Zetia    Severe aortic valve stenosis History of TAVR Echocardiogram in 2022 detailing well-functioning aortic valve Appears to have repeat echo scheduled for later this year  Chronic heart failure with preserved ejection fraction (HCC) Euvolemic, off HCTZ in the setting of renal dysfunction  Essential hypertension Blood pressure is well controlled on today's visit. No  changes made to the medications.  Tobacco abuse Still smoking a few cigs a day Reports that helps with his foot pain Cessation recommended  Chronic obstructive pulmonary disease, unspecified COPD type (HCC) Baseline SOB,on inhalers Smoking cessation recommended  Chronic cough, bronchitis, uses inhalers, rarely uses nebulizer  Mixed hyperlipidemia Continue statin with Zetia     Total encounter time more than 30 minutes  Greater than 50% was spent in counseling and coordination of care with the patient   Signed, Evalene Lunger, MD  10/15/2023 3:12 PM    [facility] [address], [place], [place] [address]

## 2023-10-17 ENCOUNTER — Ambulatory Visit: Payer: 59 | Attending: Cardiovascular Disease | Admitting: Cardiovascular Disease

## 2023-10-17 DIAGNOSIS — I700 Atherosclerosis of aorta: Secondary | ICD-10-CM

## 2023-10-17 DIAGNOSIS — I350 Nonrheumatic aortic (valve) stenosis: Secondary | ICD-10-CM

## 2023-10-17 DIAGNOSIS — I10 Essential (primary) hypertension: Secondary | ICD-10-CM

## 2023-10-17 DIAGNOSIS — E782 Mixed hyperlipidemia: Secondary | ICD-10-CM

## 2023-10-17 DIAGNOSIS — E1142 Type 2 diabetes mellitus with diabetic polyneuropathy: Secondary | ICD-10-CM

## 2023-10-17 DIAGNOSIS — Z952 Presence of prosthetic heart valve: Secondary | ICD-10-CM

## 2023-10-24 DIAGNOSIS — I5030 Unspecified diastolic (congestive) heart failure: Secondary | ICD-10-CM | POA: Diagnosis not present

## 2023-10-24 DIAGNOSIS — J449 Chronic obstructive pulmonary disease, unspecified: Secondary | ICD-10-CM | POA: Diagnosis not present

## 2023-10-25 DIAGNOSIS — N189 Chronic kidney disease, unspecified: Secondary | ICD-10-CM | POA: Diagnosis not present

## 2023-10-25 DIAGNOSIS — Z7984 Long term (current) use of oral hypoglycemic drugs: Secondary | ICD-10-CM | POA: Diagnosis not present

## 2023-10-25 DIAGNOSIS — I2510 Atherosclerotic heart disease of native coronary artery without angina pectoris: Secondary | ICD-10-CM | POA: Diagnosis not present

## 2023-10-25 DIAGNOSIS — E785 Hyperlipidemia, unspecified: Secondary | ICD-10-CM | POA: Diagnosis not present

## 2023-10-25 DIAGNOSIS — I517 Cardiomegaly: Secondary | ICD-10-CM | POA: Diagnosis not present

## 2023-10-25 DIAGNOSIS — N179 Acute kidney failure, unspecified: Secondary | ICD-10-CM | POA: Diagnosis not present

## 2023-10-25 DIAGNOSIS — K219 Gastro-esophageal reflux disease without esophagitis: Secondary | ICD-10-CM | POA: Diagnosis not present

## 2023-10-25 DIAGNOSIS — Z923 Personal history of irradiation: Secondary | ICD-10-CM | POA: Diagnosis not present

## 2023-10-25 DIAGNOSIS — E119 Type 2 diabetes mellitus without complications: Secondary | ICD-10-CM | POA: Diagnosis not present

## 2023-10-25 DIAGNOSIS — Z7982 Long term (current) use of aspirin: Secondary | ICD-10-CM | POA: Diagnosis not present

## 2023-10-25 DIAGNOSIS — I130 Hypertensive heart and chronic kidney disease with heart failure and stage 1 through stage 4 chronic kidney disease, or unspecified chronic kidney disease: Secondary | ICD-10-CM | POA: Diagnosis not present

## 2023-10-25 DIAGNOSIS — J101 Influenza due to other identified influenza virus with other respiratory manifestations: Secondary | ICD-10-CM

## 2023-10-25 DIAGNOSIS — I499 Cardiac arrhythmia, unspecified: Secondary | ICD-10-CM | POA: Diagnosis not present

## 2023-10-25 DIAGNOSIS — I10 Essential (primary) hypertension: Secondary | ICD-10-CM | POA: Diagnosis not present

## 2023-10-25 DIAGNOSIS — Z952 Presence of prosthetic heart valve: Secondary | ICD-10-CM | POA: Diagnosis not present

## 2023-10-25 DIAGNOSIS — T380X5A Adverse effect of glucocorticoids and synthetic analogues, initial encounter: Secondary | ICD-10-CM | POA: Diagnosis not present

## 2023-10-25 DIAGNOSIS — J9610 Chronic respiratory failure, unspecified whether with hypoxia or hypercapnia: Secondary | ICD-10-CM | POA: Diagnosis not present

## 2023-10-25 DIAGNOSIS — E1151 Type 2 diabetes mellitus with diabetic peripheral angiopathy without gangrene: Secondary | ICD-10-CM | POA: Diagnosis not present

## 2023-10-25 DIAGNOSIS — I252 Old myocardial infarction: Secondary | ICD-10-CM | POA: Diagnosis not present

## 2023-10-25 DIAGNOSIS — D631 Anemia in chronic kidney disease: Secondary | ICD-10-CM | POA: Diagnosis not present

## 2023-10-25 DIAGNOSIS — R0902 Hypoxemia: Secondary | ICD-10-CM | POA: Diagnosis not present

## 2023-10-25 DIAGNOSIS — I493 Ventricular premature depolarization: Secondary | ICD-10-CM | POA: Diagnosis not present

## 2023-10-25 DIAGNOSIS — Z9981 Dependence on supplemental oxygen: Secondary | ICD-10-CM | POA: Diagnosis not present

## 2023-10-25 DIAGNOSIS — W19XXXA Unspecified fall, initial encounter: Secondary | ICD-10-CM | POA: Diagnosis not present

## 2023-10-25 DIAGNOSIS — Z902 Acquired absence of lung [part of]: Secondary | ICD-10-CM | POA: Diagnosis not present

## 2023-10-25 DIAGNOSIS — Z85118 Personal history of other malignant neoplasm of bronchus and lung: Secondary | ICD-10-CM | POA: Diagnosis not present

## 2023-10-25 DIAGNOSIS — Z87891 Personal history of nicotine dependence: Secondary | ICD-10-CM | POA: Diagnosis not present

## 2023-10-25 DIAGNOSIS — Z9049 Acquired absence of other specified parts of digestive tract: Secondary | ICD-10-CM | POA: Diagnosis not present

## 2023-10-25 DIAGNOSIS — J439 Emphysema, unspecified: Secondary | ICD-10-CM | POA: Diagnosis not present

## 2023-10-25 DIAGNOSIS — J441 Chronic obstructive pulmonary disease with (acute) exacerbation: Secondary | ICD-10-CM | POA: Diagnosis not present

## 2023-10-25 DIAGNOSIS — I440 Atrioventricular block, first degree: Secondary | ICD-10-CM | POA: Diagnosis not present

## 2023-10-25 DIAGNOSIS — R918 Other nonspecific abnormal finding of lung field: Secondary | ICD-10-CM | POA: Diagnosis not present

## 2023-10-25 DIAGNOSIS — R0602 Shortness of breath: Secondary | ICD-10-CM | POA: Diagnosis not present

## 2023-10-25 DIAGNOSIS — I509 Heart failure, unspecified: Secondary | ICD-10-CM | POA: Diagnosis not present

## 2023-10-25 DIAGNOSIS — E1122 Type 2 diabetes mellitus with diabetic chronic kidney disease: Secondary | ICD-10-CM | POA: Diagnosis not present

## 2023-10-25 DIAGNOSIS — Z20822 Contact with and (suspected) exposure to covid-19: Secondary | ICD-10-CM | POA: Diagnosis not present

## 2023-10-25 DIAGNOSIS — R059 Cough, unspecified: Secondary | ICD-10-CM | POA: Diagnosis not present

## 2023-10-25 HISTORY — DX: Influenza due to other identified influenza virus with other respiratory manifestations: J10.1

## 2023-10-26 DIAGNOSIS — I5030 Unspecified diastolic (congestive) heart failure: Secondary | ICD-10-CM | POA: Diagnosis not present

## 2023-10-26 DIAGNOSIS — J449 Chronic obstructive pulmonary disease, unspecified: Secondary | ICD-10-CM | POA: Diagnosis not present

## 2023-10-26 DIAGNOSIS — J441 Chronic obstructive pulmonary disease with (acute) exacerbation: Secondary | ICD-10-CM | POA: Diagnosis not present

## 2023-10-27 DIAGNOSIS — J441 Chronic obstructive pulmonary disease with (acute) exacerbation: Secondary | ICD-10-CM | POA: Diagnosis not present

## 2023-11-04 DIAGNOSIS — J449 Chronic obstructive pulmonary disease, unspecified: Secondary | ICD-10-CM | POA: Diagnosis not present

## 2023-11-04 DIAGNOSIS — I5030 Unspecified diastolic (congestive) heart failure: Secondary | ICD-10-CM | POA: Diagnosis not present

## 2023-11-08 ENCOUNTER — Telehealth: Payer: Self-pay | Admitting: Cardiovascular Disease

## 2023-11-08 DIAGNOSIS — M1611 Unilateral primary osteoarthritis, right hip: Secondary | ICD-10-CM | POA: Diagnosis not present

## 2023-11-08 NOTE — Telephone Encounter (Signed)
Patient would like to switch from Dr. Mariah Milling to Dr. Dulce Sellar. Patient no longer lives in [place], but now lives in [place]. Please confirm provider switch.

## 2023-11-11 DIAGNOSIS — M1990 Unspecified osteoarthritis, unspecified site: Secondary | ICD-10-CM | POA: Insufficient documentation

## 2023-11-11 DIAGNOSIS — I890 Lymphedema, not elsewhere classified: Secondary | ICD-10-CM | POA: Insufficient documentation

## 2023-11-11 DIAGNOSIS — J449 Chronic obstructive pulmonary disease, unspecified: Secondary | ICD-10-CM | POA: Diagnosis not present

## 2023-11-14 ENCOUNTER — Ambulatory Visit: Payer: 59

## 2023-11-14 DIAGNOSIS — C61 Malignant neoplasm of prostate: Secondary | ICD-10-CM

## 2023-11-14 DIAGNOSIS — M1990 Unspecified osteoarthritis, unspecified site: Secondary | ICD-10-CM

## 2023-11-14 DIAGNOSIS — I890 Lymphedema, not elsewhere classified: Secondary | ICD-10-CM

## 2023-11-20 DIAGNOSIS — E782 Mixed hyperlipidemia: Secondary | ICD-10-CM | POA: Diagnosis not present

## 2023-11-20 DIAGNOSIS — Z0000 Encounter for general adult medical examination without abnormal findings: Secondary | ICD-10-CM | POA: Diagnosis not present

## 2023-11-20 DIAGNOSIS — E1169 Type 2 diabetes mellitus with other specified complication: Secondary | ICD-10-CM | POA: Diagnosis not present

## 2023-11-20 DIAGNOSIS — Z1159 Encounter for screening for other viral diseases: Secondary | ICD-10-CM | POA: Diagnosis not present

## 2023-11-20 DIAGNOSIS — E119 Type 2 diabetes mellitus without complications: Secondary | ICD-10-CM | POA: Diagnosis not present

## 2023-11-20 DIAGNOSIS — I5032 Chronic diastolic (congestive) heart failure: Secondary | ICD-10-CM | POA: Diagnosis not present

## 2023-11-24 ENCOUNTER — Ambulatory Visit: Payer: 59

## 2023-11-24 VITALS — BP 150/80 | HR 65 | Ht 69.0 in | Wt 206.0 lb

## 2023-11-24 DIAGNOSIS — I5032 Chronic diastolic (congestive) heart failure: Secondary | ICD-10-CM

## 2023-11-24 DIAGNOSIS — Z953 Presence of xenogenic heart valve: Secondary | ICD-10-CM | POA: Diagnosis not present

## 2023-11-24 DIAGNOSIS — I10 Essential (primary) hypertension: Secondary | ICD-10-CM | POA: Diagnosis not present

## 2023-11-24 NOTE — Progress Notes (Signed)
Cardiology Consultation:    Date:  11/24/2023   ID:  Nathaniel Little, DOB 19-Aug-1946, MRN [id number]  PCP:  Earley Brooke., MD  Cardiologist:  Marlyn Corporal Jerrika Ledlow, MD   Referring MD: Earley Brooke., *   Chief Complaint  Patient presents with   Follow-up     ASSESSMENT AND PLAN:   [name] 78 year old male with history of severe aortic stenosis s/p 29 mm Edwards SAPIEN TAVR 04/01/2020, no significant coronary artery disease on cardiac cath May 2021, aortic atherosclerosis and mild carotid artery disease noted on prior imaging studies, chronic heart failure with preserved ejection fraction, hypertension, diabetes, hyperlipidemia, former smoker [quit 5 months ago], COPD, OSA-uses CPAP, chronic pancreatitis, bronchogenic carcinoma identified on CTA chest June 2021 s/p left lower lobectomy in February 2022, urge incontinence of urine, sessile cecal polyp s/p elective robotic ileocecectomy March 2024, s/p robotic Whipple surgery October 2019 for intraductal papillary mucinous neoplasm, here to establish care with cardiology after his last follow-up visit with cardiologist April 2023 with heart care in [place]. Currently has no active cardiac symptoms and appears to be stable. Problem List Items Addressed This Visit     Essential hypertension - Primary   Suboptimal control at this visit today.  However on good control until recently and has his friend at home who checks closely. Advised to keep monitoring and if blood pressures consistently above 130 over 80 mmHg to notify us or his PCP. Continue carvedilol 6.25 mg twice daily and lisinopril 5 mg once a day. Target blood pressure below 130 over 80 mmHg.      Relevant Medications   atorvastatin (LIPITOR) 20 MG tablet   torsemide (DEMADEX) 20 MG tablet   carvedilol (COREG) 6.25 MG tablet   Other Relevant Orders   EKG 12-Lead (Completed)   ECHOCARDIOGRAM COMPLETE   S/p TAVR (transcatheter aortic valve replacement),  bioprosthetic   Last echocardiogram June 2022.  Appears to be doing well. Will follow-up with repeat transthoracic echocardiogram for interval change assessment.       CHF (congestive heart failure) (HCC)   Appears to be euvolemic, compensated. No significant symptoms suggestive of acute heart failure.  Continue with sodium restriction to below 2 g/day and fluid restriction to 2 L/day. Continue with his current medications carvedilol 6.25 mg twice daily and lisinopril 5 mg once daily. Continue torsemide 20 mg once daily. Given his urge urinary incontinence we will hold off on starting SGLT2 inhibitors given the risk of urinary tract infection and perineal infections.       Relevant Medications   atorvastatin (LIPITOR) 20 MG tablet   torsemide (DEMADEX) 20 MG tablet   carvedilol (COREG) 6.25 MG tablet   Return to clinic tentatively in 6 months.    History of Present Illness:    Nathaniel Little is a 78 y.o. male who is being seen today for follow-up care Cardiology. PCP is Florendo, Vevelyn Royals., *.  Previously followed up with Dr. Mariah Milling, last office visit with them at heart care in [facility] 01-08-2022 and now here to establish care since his moved from [facility] to [facility].  Pleasant gentleman here for the visit by himself.  Uses walker to ambulate.  [place] male friend who he mentions helps manage his medications and helps him.  He is in the process of relocating from the trailer park to apartment when depending on availability.  Has history of aortic stenosis s/p 29 mm Edwards SAPIEN TAVR 04/01/2020, coronary  angiogram May 2021 pre-TAVR no significant CAD, aortic atherosclerosis and mild carotid artery disease, chronic heart failure with preserved ejection fraction, hypertension, diabetes, hyperlipidemia, former smoker, COPD, OSA-uses CPAP, chronic pancreatitis, bronchogenic carcinoma identified on CTA chest June 2021 s/p left lower lobectomy 11-17-2020,  urinary urgency and incontinence, sessile cecal polyp elective robotic ileocecectomy March 2024, s/p robotic Whipple surgery October 2019 in the setting of intraductal papillary mucinous neoplasm.  Mentions overall from cardiac standpoint he mentions he seems to be at his baseline. Denies any chest pain, palpitations, shortness of breath, orthopnea, pedal edema, syncope.  Mentions recently was supposed to undergo hip surgery and aspirin was on hold, but surgery got delayed due to anemia and he is on iron supplementation.  He has not resumed aspirin yet.  Denies any blood in urine.  Does have urinary urgency and has to go immediately otherwise can end up with urinary incontinence.  Occasionally on straining at bowel movements he might notice small amounts of blood otherwise no blood in stools with regular bowel movements.  Echocardiogram from June 2022 noted LVEF 60 to 65%, grade 1 diastolic dysfunction, normal RV size and function, bioprosthetic aortic valve with V-max 2.2 m/s, dimensionless index 0.53, trivial perivalvular regurgitation.  Mentions good compliance with all his medications. Mindful about salt intake and avoid salty foods. Continues to take torsemide 20 mg once daily. Continues on carvedilol 6.25 mg twice daily and lisinopril 5 mg once a day.  EKG in the clinic today shows sinus rhythm heart rate 65/min, PR interval 232 ms mildly prolonged suggest time on AV block.  Left axis deviation QRS 86 ms duration.  No significant ST-T changes.   Past Medical History:  Diagnosis Date   (HFpEF) heart failure with preserved ejection fraction (HCC)    a. 08/2017 Echo: EF 60-65%, Gr1 DD.   Aortic atherosclerosis (HCC) 07/25/2017   Identified on CT scan 07/25/17     Arthritis    Cecal neoplasm 11/10/2022   CHF (congestive heart failure) (HCC) 03/31/2022   Chronic kidney disease 12/08/2021   Chronic obstructive pulmonary disease (HCC) 01/07/2017   Chronic respiratory failure with hypoxia  (HCC) 06/26/2021   Claudication in peripheral vascular disease (HCC) 08/22/2017   COPD (chronic obstructive pulmonary disease) (HCC)    Coronary artery disease 2021   Depression    Diabetes mellitus without complication (HCC)    DM type 2 with diabetic peripheral neuropathy (HCC) 06/13/2020   Erectile dysfunction due to arterial insufficiency 09/24/2019   Essential hypertension 01/07/2017   GERD (gastroesophageal reflux disease) 01/07/2017   Hyperopia of both eyes with astigmatism and presbyopia 06/28/2023   Hypertension    Incidental pulmonary nodule, greater than or equal to 8mm 03/11/2020   Left lower lobe - suspicious for bronchogenic neoplasm   Influenza A 10/25/2023   Lymphedema    Morbid obesity (HCC)    Overweight with body mass index (BMI) of 28 to 28.9 in adult 05/22/2021   Posterior vitreous detachment of left eye 06/28/2023   Primary osteoarthritis of right hip 06/19/2023   Prostate cancer (HCC) 03/2013   ? PANCREATIC   S/P TAVR (transcatheter aortic valve replacement) 04/01/2020   s/p TAVR with a 29 mm Edwards Sapien 3 THV via the TF approach by Drs Aundra Dubin & Cornelius Moras   Senile purpura (HCC) 03/31/2022   Severe aortic stenosis    Sleep apnea with use of continuous positive airway pressure (CPAP)    Tobacco abuse    Tremor    HEAD   Type  2 diabetes mellitus without retinopathy (HCC) 06/28/2023    Past Surgical History:  Procedure Laterality Date   ADENOIDECTOMY     APPENDECTOMY     CARDIAC CATHETERIZATION     CATARACT EXTRACTION W/PHACO Left 11/28/2018   Procedure: CATARACT EXTRACTION PHACO AND INTRAOCULAR LENS PLACEMENT (IOC) LEFT, DIABETIC;  Surgeon: Galen Manila, MD;  Location: [facility] ORS;  Service: Ophthalmology;  Laterality: Left;  [name]  00:56 CDE 9.44 Fluid pack lot # [id number]   EUS N/A 10/13/2017   Procedure: FULL UPPER ENDOSCOPIC ULTRASOUND (EUS) RADIAL;  Surgeon: Bearl Mulberry, MD;  Location: [facility] ENDOSCOPY;  Service: Gastroenterology;  Laterality:  N/A;   FLEXIBLE BRONCHOSCOPY N/A 11/19/2020   Procedure: FLEXIBLE BRONCHOSCOPY;  Surgeon: Loreli Slot, MD;  Location: [facility] OR;  Service: Thoracic;  Laterality: N/A;   INTERCOSTAL NERVE BLOCK Left 11/17/2020   Procedure: INTERCOSTAL NERVE BLOCK;  Surgeon: Loreli Slot, MD;  Location: [facility] OR;  Service: Thoracic;  Laterality: Left;   NODE DISSECTION Left 11/17/2020   Procedure: NODE DISSECTION;  Surgeon: Loreli Slot, MD;  Location: [facility] OR;  Service: Thoracic;  Laterality: Left;   PANCREATICODUODENECTOMY  07/23/2018   RIGHT/LEFT HEART CATH AND CORONARY ANGIOGRAPHY N/A 02/21/2020   Procedure: RIGHT/LEFT HEART CATH AND CORONARY ANGIOGRAPHY;  Surgeon: Antonieta Iba, MD;  Location: [facility] INVASIVE CV LAB;  Service: Cardiovascular;  Laterality: N/A;   TEE WITHOUT CARDIOVERSION N/A 04/01/2020   Procedure: TRANSESOPHAGEAL ECHOCARDIOGRAM (TEE);  Surgeon: Kathleene Hazel, MD;  Location: [facility] INVASIVE CV LAB;  Service: Open Heart Surgery;  Laterality: N/A;   TONSILLECTOMY     TRANSCATHETER AORTIC VALVE REPLACEMENT, TRANSFEMORAL N/A 04/01/2020   Procedure: TRANSCATHETER AORTIC VALVE REPLACEMENT, TRANSFEMORAL;  Surgeon: Kathleene Hazel, MD;  Location: [facility] INVASIVE CV LAB;  Service: Open Heart Surgery;  Laterality: N/A;   wipple      Current Medications: Current Meds  Medication Sig   atorvastatin (LIPITOR) 20 MG tablet Take 20 mg by mouth daily.   BREZTRI AEROSPHERE 160-9-4.8 MCG/ACT AERO Inhale 2 puffs into the lungs 2 (two) times daily.   carvedilol (COREG) 6.25 MG tablet Take 6.25 mg by mouth 2 (two) times daily with a meal.   Diclofenac-miSOPROStol 75-0.2 MG TBEC Take 1 tablet by mouth 2 (two) times daily.   ezetimibe (ZETIA) 10 MG tablet Take 1 tablet (10 mg total) by mouth daily.   ferrous sulfate 325 (65 FE) MG tablet Take 325 mg by mouth daily with breakfast.   glipiZIDE (GLUCOTROL XL) 10 MG 24 hr tablet Take 10 mg by mouth daily.   LANTUS SOLOSTAR 100 UNIT/ML  Solostar Pen Inject 10 Units into the skin daily.   lisinopril (ZESTRIL) 5 MG tablet Take 1 tablet (5 mg total) by mouth daily.   potassium chloride (KLOR-CON) 10 MEQ tablet Take 10 mEq by mouth daily.   primidone (MYSOLINE) 50 MG tablet Take 100 mg by mouth at bedtime.   QUEtiapine (SEROQUEL) 25 MG tablet Take 25 mg by mouth 2 (two) times daily.   torsemide (DEMADEX) 20 MG tablet Take 10 mg by mouth daily.   varenicline (CHANTIX) 1 MG tablet Take 1 mg by mouth 2 (two) times daily.     Allergies:   Codeine   Social History   Socioeconomic History   Marital status: Widowed    Spouse name: Not on file   Number of children: 2   Years of education: Not on file   Highest education level: Not on file  Occupational History   Occupation:  retired -Production designer, theatre/television/film  Tobacco Use   Smoking status: Former    Current packs/day: 0.00    Average packs/day: 4.0 packs/day for 68.0 years (272.0 ttl pk-yrs)    Types: Cigarettes    Start date: 10/04/1953    Quit date: 10/04/2021    Years since quitting: 2.1   Smokeless tobacco: Former    Types: Chew, Snuff    Quit date: 2000   Tobacco comments:    1 cigs a day   Vaping Use   Vaping status: Never Used  Substance and Sexual Activity   Alcohol use: Not Currently    Alcohol/week: 0.0 - 1.0 standard drinks of alcohol    Comment: quit 40years ago   Drug use: No   Sexual activity: Not Currently  Other Topics Concern   Not on file  Social History Narrative   Living at [employer]    Social Drivers of Health   Financial Resource Strain: Low Risk  (07/12/2022)   Overall Financial Resource Strain (CARDIA)    Difficulty of Paying Living Expenses: Not hard at all  Food Insecurity: Low Risk  (10/25/2023)   Received from [facility]   Hunger Vital Sign    Worried About Running Out of Food in the Last Year: Never true    Ran Out of Food in the Last Year: Never true  Transportation Needs: No Transportation Needs (10/25/2023)   Received from  [employer]    In the past 12 months, has lack of reliable transportation kept you from medical appointments, meetings, work or from getting things needed for daily living? : No  Physical Activity: Inactive (05/18/2021)   Exercise Vital Sign    Days of Exercise per Week: 0 days    Minutes of Exercise per Session: 0 min  Stress: No Stress Concern Present (07/12/2022)   [employer] of Occupational Health - Occupational Stress Questionnaire    Feeling of Stress : Only a little  Social Connections: Moderately Isolated (07/12/2022)   Social Connection and Isolation Panel [NHANES]    Frequency of Communication with Friends and Family: More than three times a week    Frequency of Social Gatherings with Friends and Family: More than three times a week    Attends Religious Services: More than 4 times per year    Active Member of [employer] or Organizations: No    Attends Banker Meetings: Never    Marital Status: Widowed     Family History: The patient's family history includes Breast cancer in his mother; Cancer in his father; Cerebral palsy in his brother; Tuberculosis in his paternal uncle. There is no history of Prostate cancer, Kidney cancer, or Bladder Cancer. ROS:   Please see the history of present illness.    All 14 point review of systems negative except as described per history of present illness.  EKGs/Labs/Other Studies Reviewed:    The following studies were reviewed today:   EKG:  EKG Interpretation Date/Time:  Thursday November 24 2023 16:11:35 EST Ventricular Rate:  65 PR Interval:  232 QRS Duration:  86 QT Interval:  384 QTC Calculation: 399 R Axis:   -47  Text Interpretation: Sinus rhythm with 1st degree A-V block Left axis deviation Pulmonary disease pattern When compared with ECG of 08-Apr-2022 21:24, PR interval has increased QRS axis Shifted left Nonspecific T wave abnormality has replaced inverted T waves in Inferior leads Confirmed  by Huntley Dec reddy [id number]) on 11/24/2023 4:38:22 PM  Recent Labs: No results found for requested labs within last 365 days.  Recent Lipid Panel    Component Value Date/Time   CHOL 157 06/29/2022 0934   CHOL 149 02/08/2020 1228   TRIG 118 06/29/2022 0934   HDL 53 06/29/2022 0934   HDL 38 (L) 02/08/2020 1228   CHOLHDL 3.0 06/29/2022 0934   LDLCALC 83 06/29/2022 0934    Physical Exam:    VS:  BP (!) 150/80 (BP Location: Left Arm, Patient Position: Sitting, Cuff Size: Normal)   Pulse 65   Ht 5\' 9"  (1.753 m)   Wt 206 lb (93.4 kg)   SpO2 97%   BMI 30.42 kg/m     Wt Readings from Last 3 Encounters:  11/24/23 206 lb (93.4 kg)  06/29/22 190 lb (86.2 kg)  04/26/22 196 lb (88.9 kg)     GENERAL:  Well nourished, well developed in no acute distress NECK: No JVD; No carotid bruits CARDIAC: RRR, S1 and S2 present, 4/6 harsh ejection systolic murmur consistent with history of TAVR CHEST:  Clear to auscultation without rales, wheezing or rhonchi Abdomen: Soft nontender, scars from prior surgeries present. Extremities: No pitting pedal edema. Pulses bilaterally symmetric with radial 2+ and dorsalis pedis 2+ NEUROLOGIC:  Alert and oriented x 3  Medication Adjustments/Labs and Tests Ordered: Current medicines are reviewed at length with the patient today.  Concerns regarding medicines are outlined above.  Orders Placed This Encounter  Procedures   EKG 12-Lead   ECHOCARDIOGRAM COMPLETE   No orders of the defined types were placed in this encounter.   Signed, Cecille Amsterdam, MD, MPH, [facility]. 11/24/2023 4:41 PM    [facility]

## 2023-11-24 NOTE — Patient Instructions (Signed)
 Medication Instructions:  Your physician recommends that you continue on your current medications as directed. Please refer to the Current Medication list given to you today.  *If you need a refill on your cardiac medications before your next appointment, please call your pharmacy*   Lab Work: None If you have labs (blood work) drawn today and your tests are completely normal, you will receive your results only by: MyChart Message (if you have MyChart) OR A paper copy in the mail If you have any lab test that is abnormal or we need to change your treatment, we will call you to review the results.   Testing/Procedures: Your physician has requested that you have an echocardiogram. Echocardiography is a painless test that uses sound waves to create images of your heart. It provides your doctor with information about the size and shape of your heart and how well your heart's chambers and valves are working. This procedure takes approximately one hour. There are no restrictions for this procedure. Please do NOT wear cologne, perfume, aftershave, or lotions (deodorant is allowed). Please arrive 15 minutes prior to your appointment time.  Please note: We ask at that you not bring children with you during ultrasound (echo/ vascular) testing. Due to room size and safety concerns, children are not allowed in the ultrasound rooms during exams. Our front office staff cannot provide observation of children in our lobby area while testing is being conducted. An adult accompanying a patient to their appointment will only be allowed in the ultrasound room at the discretion of the ultrasound technician under special circumstances. We apologize for any inconvenience.    Follow-Up: At [facility], you and your health needs are our priority.  As part of our continuing mission to provide you with exceptional heart care, we have created designated Provider Care Teams.  These Care Teams include your  primary Cardiologist (physician) and Advanced Practice Providers (APPs -  Physician Assistants and Nurse Practitioners) who all work together to provide you with the care you need, when you need it.  We recommend signing up for the patient portal called "MyChart".  Sign up information is provided on this After Visit Summary.  MyChart is used to connect with patients for Virtual Visits (Telemedicine).  Patients are able to view lab/test results, encounter notes, upcoming appointments, etc.  Non-urgent messages can be sent to your provider as well.   To learn more about what you can do with MyChart, go to [email or web address].    Your next appointment:   6 month(s)  Provider:   Huntley Dec, MD    Other Instructions None

## 2023-11-24 NOTE — Assessment & Plan Note (Signed)
Appears to be euvolemic, compensated. No significant symptoms suggestive of acute heart failure.  Continue with sodium restriction to below 2 g/day and fluid restriction to 2 L/day. Continue with his current medications carvedilol 6.25 mg twice daily and lisinopril 5 mg once daily. Continue torsemide 20 mg once daily. Given his urge urinary incontinence we will hold off on starting SGLT2 inhibitors given the risk of urinary tract infection and perineal infections.

## 2023-11-24 NOTE — Assessment & Plan Note (Signed)
Suboptimal control at this visit today.  However on good control until recently and has his friend at home who checks closely. Advised to keep monitoring and if blood pressures consistently above 130 over 80 mmHg to notify us or his PCP. Continue carvedilol 6.25 mg twice daily and lisinopril 5 mg once a day. Target blood pressure below 130 over 80 mmHg.

## 2023-11-24 NOTE — Assessment & Plan Note (Signed)
Last echocardiogram June 2022.  Appears to be doing well. Will follow-up with repeat transthoracic echocardiogram for interval change assessment.

## 2023-11-25 ENCOUNTER — Other Ambulatory Visit: Payer: Self-pay

## 2023-11-25 ENCOUNTER — Telehealth: Payer: Self-pay

## 2023-11-25 DIAGNOSIS — I10 Essential (primary) hypertension: Secondary | ICD-10-CM

## 2023-11-25 DIAGNOSIS — I359 Nonrheumatic aortic valve disorder, unspecified: Secondary | ICD-10-CM

## 2023-11-25 DIAGNOSIS — I5032 Chronic diastolic (congestive) heart failure: Secondary | ICD-10-CM

## 2023-11-25 DIAGNOSIS — Z953 Presence of xenogenic heart valve: Secondary | ICD-10-CM

## 2023-11-25 NOTE — Telephone Encounter (Signed)
Pt would like a c/b regarding more info for paperwork that his did not finish completing at yesterday's visit. Please advise

## 2023-12-02 DIAGNOSIS — I10 Essential (primary) hypertension: Secondary | ICD-10-CM | POA: Diagnosis not present

## 2023-12-02 DIAGNOSIS — E119 Type 2 diabetes mellitus without complications: Secondary | ICD-10-CM | POA: Diagnosis not present

## 2023-12-02 DIAGNOSIS — N138 Other obstructive and reflux uropathy: Secondary | ICD-10-CM | POA: Diagnosis not present

## 2023-12-02 DIAGNOSIS — E782 Mixed hyperlipidemia: Secondary | ICD-10-CM | POA: Diagnosis not present

## 2023-12-02 DIAGNOSIS — D509 Iron deficiency anemia, unspecified: Secondary | ICD-10-CM | POA: Diagnosis not present

## 2023-12-02 DIAGNOSIS — E1169 Type 2 diabetes mellitus with other specified complication: Secondary | ICD-10-CM | POA: Diagnosis not present

## 2023-12-09 DIAGNOSIS — J449 Chronic obstructive pulmonary disease, unspecified: Secondary | ICD-10-CM | POA: Diagnosis not present

## 2023-12-15 ENCOUNTER — Ambulatory Visit (HOSPITAL_BASED_OUTPATIENT_CLINIC_OR_DEPARTMENT_OTHER)
Admission: RE | Admit: 2023-12-15 | Discharge: 2023-12-15 | Disposition: A | Discharge status: Home / Self Care | Payer: 59 | Source: Ambulatory Visit

## 2023-12-15 DIAGNOSIS — I359 Nonrheumatic aortic valve disorder, unspecified: Secondary | ICD-10-CM | POA: Insufficient documentation

## 2023-12-15 DIAGNOSIS — I5032 Chronic diastolic (congestive) heart failure: Secondary | ICD-10-CM | POA: Diagnosis not present

## 2023-12-15 LAB — ECHOCARDIOGRAM COMPLETE
AR max vel: 1.89 cm2
AV Area VTI: 2.11 cm2
AV Area mean vel: 1.96 cm2
AV Mean grad: 10.3 mmHg
AV Peak grad: 20.9 mmHg
AV Vena cont: 0.25 cm
Ao pk vel: 2.29 m/s
Area-P 1/2: 2.46 cm2
Calc EF: 63.3 %
MV M vel: 2.05 m/s
MV Peak grad: 16.8 mmHg
P 1/2 time: 1739 ms
S' Lateral: 2.6 cm
Single Plane A2C EF: 65.8 %
Single Plane A4C EF: 65.6 %

## 2024-01-09 DIAGNOSIS — J449 Chronic obstructive pulmonary disease, unspecified: Secondary | ICD-10-CM | POA: Diagnosis not present

## 2024-01-11 DIAGNOSIS — R631 Polydipsia: Secondary | ICD-10-CM | POA: Diagnosis not present

## 2024-01-11 DIAGNOSIS — Z79899 Other long term (current) drug therapy: Secondary | ICD-10-CM | POA: Diagnosis not present

## 2024-01-11 DIAGNOSIS — H538 Other visual disturbances: Secondary | ICD-10-CM | POA: Diagnosis not present

## 2024-01-11 DIAGNOSIS — E785 Hyperlipidemia, unspecified: Secondary | ICD-10-CM | POA: Diagnosis not present

## 2024-01-11 DIAGNOSIS — R799 Abnormal finding of blood chemistry, unspecified: Secondary | ICD-10-CM | POA: Diagnosis not present

## 2024-01-11 DIAGNOSIS — I10 Essential (primary) hypertension: Secondary | ICD-10-CM | POA: Diagnosis not present

## 2024-01-11 DIAGNOSIS — G4730 Sleep apnea, unspecified: Secondary | ICD-10-CM | POA: Diagnosis not present

## 2024-01-11 DIAGNOSIS — D509 Iron deficiency anemia, unspecified: Secondary | ICD-10-CM | POA: Diagnosis not present

## 2024-01-11 DIAGNOSIS — Z0189 Encounter for other specified special examinations: Secondary | ICD-10-CM | POA: Diagnosis not present

## 2024-01-11 DIAGNOSIS — E118 Type 2 diabetes mellitus with unspecified complications: Secondary | ICD-10-CM | POA: Diagnosis not present

## 2024-01-11 DIAGNOSIS — R0689 Other abnormalities of breathing: Secondary | ICD-10-CM | POA: Diagnosis not present

## 2024-01-11 DIAGNOSIS — G2581 Restless legs syndrome: Secondary | ICD-10-CM | POA: Diagnosis not present

## 2024-01-24 DIAGNOSIS — I509 Heart failure, unspecified: Secondary | ICD-10-CM | POA: Diagnosis not present

## 2024-01-24 DIAGNOSIS — Z0001 Encounter for general adult medical examination with abnormal findings: Secondary | ICD-10-CM | POA: Diagnosis not present

## 2024-01-24 DIAGNOSIS — E1122 Type 2 diabetes mellitus with diabetic chronic kidney disease: Secondary | ICD-10-CM | POA: Diagnosis not present

## 2024-01-24 DIAGNOSIS — E1140 Type 2 diabetes mellitus with diabetic neuropathy, unspecified: Secondary | ICD-10-CM | POA: Diagnosis not present

## 2024-01-24 DIAGNOSIS — N1831 Chronic kidney disease, stage 3a: Secondary | ICD-10-CM | POA: Diagnosis not present

## 2024-01-24 DIAGNOSIS — M1611 Unilateral primary osteoarthritis, right hip: Secondary | ICD-10-CM | POA: Diagnosis not present

## 2024-01-24 DIAGNOSIS — R748 Abnormal levels of other serum enzymes: Secondary | ICD-10-CM | POA: Diagnosis not present

## 2024-01-24 DIAGNOSIS — J9610 Chronic respiratory failure, unspecified whether with hypoxia or hypercapnia: Secondary | ICD-10-CM | POA: Diagnosis not present

## 2024-01-24 DIAGNOSIS — I10 Essential (primary) hypertension: Secondary | ICD-10-CM | POA: Diagnosis not present

## 2024-01-24 DIAGNOSIS — R7989 Other specified abnormal findings of blood chemistry: Secondary | ICD-10-CM | POA: Diagnosis not present

## 2024-01-31 DIAGNOSIS — L989 Disorder of the skin and subcutaneous tissue, unspecified: Secondary | ICD-10-CM | POA: Diagnosis not present

## 2024-01-31 DIAGNOSIS — Z794 Long term (current) use of insulin: Secondary | ICD-10-CM | POA: Diagnosis not present

## 2024-01-31 DIAGNOSIS — R748 Abnormal levels of other serum enzymes: Secondary | ICD-10-CM | POA: Diagnosis not present

## 2024-01-31 DIAGNOSIS — N1831 Chronic kidney disease, stage 3a: Secondary | ICD-10-CM | POA: Diagnosis not present

## 2024-01-31 DIAGNOSIS — T50905A Adverse effect of unspecified drugs, medicaments and biological substances, initial encounter: Secondary | ICD-10-CM | POA: Diagnosis not present

## 2024-01-31 DIAGNOSIS — Z23 Encounter for immunization: Secondary | ICD-10-CM | POA: Diagnosis not present

## 2024-01-31 DIAGNOSIS — E1122 Type 2 diabetes mellitus with diabetic chronic kidney disease: Secondary | ICD-10-CM | POA: Diagnosis not present

## 2024-01-31 DIAGNOSIS — M25559 Pain in unspecified hip: Secondary | ICD-10-CM | POA: Diagnosis not present

## 2024-01-31 DIAGNOSIS — I10 Essential (primary) hypertension: Secondary | ICD-10-CM | POA: Diagnosis not present

## 2024-01-31 DIAGNOSIS — D509 Iron deficiency anemia, unspecified: Secondary | ICD-10-CM | POA: Diagnosis not present

## 2024-01-31 DIAGNOSIS — R2681 Unsteadiness on feet: Secondary | ICD-10-CM | POA: Diagnosis not present

## 2024-10-12 ENCOUNTER — Telehealth: Payer: Self-pay | Admitting: *Deleted

## 2024-10-12 NOTE — Telephone Encounter (Signed)
 [facility] called for medical records request on this patient. Stated pt has an elevated psa level and they need clinical information on patient's prostate cancer. I asked the caller to send a release request to [phone]. Of note: we have not seen this patient since 2023

## 2024-10-16 ENCOUNTER — Telehealth: Payer: Self-pay | Admitting: *Deleted

## 2024-10-16 NOTE — Telephone Encounter (Signed)
 Patient called triage to inquire if his records were being sent to [facility]. I explained to him that [facility] called on 1/9 and stated that they needed clinical records relating to his history of patient's prostate cancer. I asked the caller then to send a release request to [phone]. He said that he would call his urology office back to make sure that they didn't need a consent on file. He thanked me for speaking to him.
